# Patient Record
Sex: Female | Born: 1935
Health system: Southern US, Community
[De-identification: ages and names within clinical notes are randomized; demographics above are authoritative.]

## PROBLEM LIST (undated history)

## (undated) DIAGNOSIS — G709 Myoneural disorder, unspecified: Secondary | ICD-10-CM

## (undated) DIAGNOSIS — E039 Hypothyroidism, unspecified: Secondary | ICD-10-CM

## (undated) DIAGNOSIS — I509 Heart failure, unspecified: Secondary | ICD-10-CM

## (undated) DIAGNOSIS — R0602 Shortness of breath: Secondary | ICD-10-CM

## (undated) DIAGNOSIS — J449 Chronic obstructive pulmonary disease, unspecified: Secondary | ICD-10-CM

## (undated) DIAGNOSIS — I429 Cardiomyopathy, unspecified: Secondary | ICD-10-CM

## (undated) DIAGNOSIS — I471 Supraventricular tachycardia, unspecified: Secondary | ICD-10-CM

## (undated) DIAGNOSIS — C4490 Unspecified malignant neoplasm of skin, unspecified: Secondary | ICD-10-CM

## (undated) DIAGNOSIS — I447 Left bundle-branch block, unspecified: Secondary | ICD-10-CM

## (undated) DIAGNOSIS — L570 Actinic keratosis: Secondary | ICD-10-CM

## (undated) DIAGNOSIS — I2510 Atherosclerotic heart disease of native coronary artery without angina pectoris: Secondary | ICD-10-CM

## (undated) DIAGNOSIS — E119 Type 2 diabetes mellitus without complications: Secondary | ICD-10-CM

## (undated) DIAGNOSIS — I10 Essential (primary) hypertension: Secondary | ICD-10-CM

## (undated) DIAGNOSIS — M1990 Unspecified osteoarthritis, unspecified site: Secondary | ICD-10-CM

## (undated) DIAGNOSIS — I739 Peripheral vascular disease, unspecified: Secondary | ICD-10-CM

## (undated) DIAGNOSIS — J189 Pneumonia, unspecified organism: Secondary | ICD-10-CM

## (undated) DIAGNOSIS — J302 Other seasonal allergic rhinitis: Secondary | ICD-10-CM

## (undated) DIAGNOSIS — E785 Hyperlipidemia, unspecified: Secondary | ICD-10-CM

## (undated) HISTORY — PX: RIGHT OOPHORECTOMY: SHX2359

## (undated) HISTORY — PX: JOINT REPLACEMENT: SHX530

## (undated) HISTORY — DX: Heart failure, unspecified: I50.9

## (undated) HISTORY — DX: Actinic keratosis: L57.0

## (undated) HISTORY — PX: EYE SURGERY: SHX253

## (undated) HISTORY — PX: ESOPHAGOGASTRODUODENOSCOPY: SHX1529

## (undated) HISTORY — PX: BACK SURGERY: SHX140

## (undated) HISTORY — PX: COLONOSCOPY: SHX174

## (undated) HISTORY — PX: CARPAL TUNNEL RELEASE: SHX101

---

## 1960-03-26 HISTORY — PX: BREAST SURGERY: SHX581

## 1963-03-27 HISTORY — PX: BREAST CYST ASPIRATION: SHX578

## 2010-06-05 ENCOUNTER — Emergency Department: Payer: Self-pay | Admitting: Emergency Medicine

## 2010-06-09 ENCOUNTER — Inpatient Hospital Stay: Payer: Self-pay | Admitting: Internal Medicine

## 2010-06-19 ENCOUNTER — Inpatient Hospital Stay: Payer: Self-pay | Admitting: Internal Medicine

## 2010-06-26 LAB — PATHOLOGY REPORT

## 2010-07-12 ENCOUNTER — Encounter (HOSPITAL_COMMUNITY): Payer: Medicare Other

## 2010-07-12 ENCOUNTER — Other Ambulatory Visit: Payer: Self-pay | Admitting: Orthopedic Surgery

## 2010-07-12 LAB — BASIC METABOLIC PANEL
BUN: 12 mg/dL (ref 6–23)
CO2: 25 mEq/L (ref 19–32)
Calcium: 10.1 mg/dL (ref 8.4–10.5)
Chloride: 107 mEq/L (ref 96–112)
Creatinine, Ser: 0.93 mg/dL (ref 0.4–1.2)
GFR calc Af Amer: >60 mL/min (ref >60)
GFR calc non Af Amer: 59 mL/min — ABNORMAL LOW (ref >60)
Glucose, Bld: 107 mg/dL — ABNORMAL HIGH (ref 70–99)
Potassium: 4.3 mEq/L (ref 3.5–5.1)
Sodium: 141 mEq/L (ref 135–145)

## 2010-07-12 LAB — CBC
HCT: 41 % (ref 36.0–46.0)
Hemoglobin: 13.7 g/dL (ref 12.0–15.0)
MCH: 31.4 pg (ref 26.0–34.0)
MCHC: 33.4 g/dL (ref 30.0–36.0)
MCV: 93.8 fL (ref 78.0–100.0)
Platelets: 363 10*3/uL (ref 150–400)
RBC: 4.37 MIL/uL (ref 3.87–5.11)
RDW: 12.9 % (ref 11.5–15.5)
WBC: 6.2 10*3/uL (ref 4.0–10.5)

## 2010-07-12 LAB — SURGICAL PCR SCREEN
MRSA, PCR: NEGATIVE
Staphylococcus aureus: NEGATIVE

## 2010-07-14 ENCOUNTER — Inpatient Hospital Stay (HOSPITAL_COMMUNITY): Payer: Medicare Other

## 2010-07-14 ENCOUNTER — Inpatient Hospital Stay (HOSPITAL_COMMUNITY)
Admission: RE | Admit: 2010-07-14 | Discharge: 2010-07-16 | DRG: 491 | Disposition: A | Discharge status: Home / Self Care | Payer: Medicare Other | Source: Ambulatory Visit | Attending: Orthopedic Surgery | Admitting: Orthopedic Surgery

## 2010-07-14 DIAGNOSIS — M48061 Spinal stenosis, lumbar region without neurogenic claudication: Principal | ICD-10-CM | POA: Diagnosis present

## 2010-07-14 DIAGNOSIS — Z01812 Encounter for preprocedural laboratory examination: Secondary | ICD-10-CM

## 2010-07-14 DIAGNOSIS — J4489 Other specified chronic obstructive pulmonary disease: Secondary | ICD-10-CM | POA: Diagnosis present

## 2010-07-14 DIAGNOSIS — I10 Essential (primary) hypertension: Secondary | ICD-10-CM | POA: Diagnosis present

## 2010-07-14 DIAGNOSIS — E039 Hypothyroidism, unspecified: Secondary | ICD-10-CM | POA: Diagnosis present

## 2010-07-14 DIAGNOSIS — M549 Dorsalgia, unspecified: Secondary | ICD-10-CM

## 2010-07-14 DIAGNOSIS — K219 Gastro-esophageal reflux disease without esophagitis: Secondary | ICD-10-CM | POA: Diagnosis present

## 2010-07-14 DIAGNOSIS — J449 Chronic obstructive pulmonary disease, unspecified: Secondary | ICD-10-CM | POA: Diagnosis present

## 2010-07-27 NOTE — Op Note (Signed)
NAMEGIA, Kathryn [name]               ACCOUNT NO.:  [id number]  MEDICAL RECORD NO.:  [id number]           PATIENT TYPE:  I  LOCATION:  0005                         FACILITY:  [facility]  PHYSICIAN:  Marlowe Kays, M.D.  DATE OF BIRTH:  [birth year]  DATE OF PROCEDURE:  07/14/2010 DATE OF DISCHARGE:                              OPERATIVE REPORT   PREOPERATIVE DIAGNOSIS:  Spinal stenosis, L3-L4, L4-L5.  POSTOPERATIVE DIAGNOSIS:  Spinal stenosis, L3-L4, L4-L5.  OPERATION:  Central decompressive laminectomy at L3-L4, L4-L5.  SURGEON:  Marlowe Kays, M.D.  ASSISTANT:  Georges Lynch. Darrelyn Hillock, M.D.  ANESTHESIA:  General.  PLAN/JUSTIFICATION FOR PROCEDURE:  She has had several year history of progressive back and bilateral leg pain, right may be a little worse than the left.  She had a lumbar MRI performed at [facility], [place], on September 13, 2008, which showed moderately severe spinal stenosis at L4-L5 secondary to grade 1 degenerative spondylolisthesis and ligamentum flavum hypertrophy as well as some slight neural foraminal narrowing at L3-L4 secondary to the same.  On my reading, I felt there might even possibly be a small lateral disk bulge at L4-L5 on the right.  She did have a disk bulge there.  She has failed to respond to nonsurgical treatment consequently is seen today for the above- mentioned surgery.  DESCRIPTION OF PROCEDURE:  Prophylactic antibiotics, satisfactory general anesthesia, Foley catheter placed, placed in the prone position on rolls.  Back was prepped with DuraPrep, draped in sterile field. Time-out performed with 3 needles and spinal needles and lateral x-ray, I tentatively located the operative area desired.  I completely draped her back in sterile field.  Ioban employed.  Vertical midline incision went down through the subcutaneous tissue to expose the spinous processes of what I thought were L4 and L5.  Kocher clamps were placed and a second lateral x-ray  taken confirming that we indeed were at L4 and L5.  We then continued dissecting soft tissue off the neural arches at these levels working up to L3.  I placed self-retaining McCullough retractors with double-action rongeur.  I then removed spinous process and good portion of the neural arch of L4 and substantial portion of superior part of the spinous process of L5 and inferior L3.  Working first with double-action rongeurs and then Kerrison rongeur and finally bringing the microscope, we thoroughly decompressed her from L3 to L5. A confirmatory x-ray was taken.  All foramina were widely patent at the conclusion of decompression.  Her stenosis appeared to be most severe as depicted on the MRI at L4-L5.  We then looked at the L4-L5 disk on the right and it was firm with the L5 nerve root well decompressed.  The wound was irrigated with sterile saline and closure performed.  I placed Gelfoam soaked in thrombin over the dura and removed the self-retaining retractors.  There was no unusual bleeding.  I closed the wound in layers with interrupted #1 Vicryl in the paralumbar muscle and fascia and deep subcu tissue, 2-0 Vicryl superficially in subcutaneous tissue, staples in the skin.  During the closure, I left a small aperture of the  paralumbar muscle and fascia distally to allow for egress of any fluids.  She tolerated the procedure well and was taken to the recovery room in satisfactory condition with no known complications. Estimated blood loss was perhaps 150 cc.  No blood replacement.          ______________________________ Marlowe Kays, M.D.     JA/MEDQ  D:  07/14/2010  T:  07/14/2010  Job:  [id number]  Electronically Signed by Marlowe Kays M.D. on 07/27/2010 12:36:13 PM

## 2011-04-09 DIAGNOSIS — M48061 Spinal stenosis, lumbar region without neurogenic claudication: Secondary | ICD-10-CM | POA: Diagnosis not present

## 2011-04-27 ENCOUNTER — Ambulatory Visit: Payer: Self-pay | Admitting: Orthopedic Surgery

## 2011-04-27 DIAGNOSIS — M48061 Spinal stenosis, lumbar region without neurogenic claudication: Secondary | ICD-10-CM | POA: Diagnosis not present

## 2011-04-27 DIAGNOSIS — M5126 Other intervertebral disc displacement, lumbar region: Secondary | ICD-10-CM | POA: Diagnosis not present

## 2011-04-27 DIAGNOSIS — Q762 Congenital spondylolisthesis: Secondary | ICD-10-CM | POA: Diagnosis not present

## 2011-04-27 DIAGNOSIS — M545 Low back pain, unspecified: Secondary | ICD-10-CM | POA: Diagnosis not present

## 2011-04-27 DIAGNOSIS — M47817 Spondylosis without myelopathy or radiculopathy, lumbosacral region: Secondary | ICD-10-CM | POA: Diagnosis not present

## 2011-04-27 DIAGNOSIS — M549 Dorsalgia, unspecified: Secondary | ICD-10-CM | POA: Diagnosis not present

## 2011-04-27 DIAGNOSIS — M4310 Spondylolisthesis, site unspecified: Secondary | ICD-10-CM | POA: Diagnosis not present

## 2011-04-27 DIAGNOSIS — M5137 Other intervertebral disc degeneration, lumbosacral region: Secondary | ICD-10-CM | POA: Diagnosis not present

## 2011-05-07 DIAGNOSIS — M48061 Spinal stenosis, lumbar region without neurogenic claudication: Secondary | ICD-10-CM | POA: Diagnosis not present

## 2011-05-09 ENCOUNTER — Other Ambulatory Visit: Payer: Self-pay | Admitting: Orthopedic Surgery

## 2011-05-15 DIAGNOSIS — M48061 Spinal stenosis, lumbar region without neurogenic claudication: Secondary | ICD-10-CM | POA: Diagnosis not present

## 2011-05-16 ENCOUNTER — Encounter (HOSPITAL_COMMUNITY): Payer: Self-pay | Admitting: Pharmacy Technician

## 2011-05-17 ENCOUNTER — Encounter (HOSPITAL_COMMUNITY)
Admission: RE | Admit: 2011-05-17 | Discharge: 2011-05-17 | Disposition: A | Discharge status: Home / Self Care | Payer: Medicare Other | Source: Ambulatory Visit | Attending: Orthopedic Surgery | Admitting: Orthopedic Surgery

## 2011-05-17 ENCOUNTER — Encounter (HOSPITAL_COMMUNITY): Payer: Self-pay

## 2011-05-17 HISTORY — DX: Hypothyroidism, unspecified: E03.9

## 2011-05-17 HISTORY — DX: Essential (primary) hypertension: I10

## 2011-05-17 HISTORY — DX: Shortness of breath: R06.02

## 2011-05-17 HISTORY — DX: Other seasonal allergic rhinitis: J30.2

## 2011-05-17 HISTORY — DX: Peripheral vascular disease, unspecified: I73.9

## 2011-05-17 HISTORY — DX: Myoneural disorder, unspecified: G70.9

## 2011-05-17 HISTORY — DX: Unspecified osteoarthritis, unspecified site: M19.90

## 2011-05-17 HISTORY — DX: Pneumonia, unspecified organism: J18.9

## 2011-05-17 HISTORY — DX: Hyperlipidemia, unspecified: E78.5

## 2011-05-17 HISTORY — DX: Unspecified malignant neoplasm of skin, unspecified: C44.90

## 2011-05-17 LAB — CBC
HCT: 39 % (ref 36.0–46.0)
Hemoglobin: 12.8 g/dL (ref 12.0–15.0)
MCH: 30.2 pg (ref 26.0–34.0)
MCHC: 32.8 g/dL (ref 30.0–36.0)
MCV: 92 fL (ref 78.0–100.0)
Platelets: 330 10*3/uL (ref 150–400)
RBC: 4.24 MIL/uL (ref 3.87–5.11)
RDW: 12.7 % (ref 11.5–15.5)
WBC: 6.6 10*3/uL (ref 4.0–10.5)

## 2011-05-17 LAB — BASIC METABOLIC PANEL
BUN: 19 mg/dL (ref 6–23)
CO2: 25 mEq/L (ref 19–32)
Calcium: 10.1 mg/dL (ref 8.4–10.5)
Chloride: 100 mEq/L (ref 96–112)
Creatinine, Ser: 0.74 mg/dL (ref 0.50–1.10)
GFR calc Af Amer: >90 mL/min (ref >90)
GFR calc non Af Amer: 81 mL/min — ABNORMAL LOW (ref >90)
Glucose, Bld: 109 mg/dL — ABNORMAL HIGH (ref 70–99)
Potassium: 4.3 mEq/L (ref 3.5–5.1)
Sodium: 136 mEq/L (ref 135–145)

## 2011-05-17 LAB — SURGICAL PCR SCREEN
MRSA, PCR: NEGATIVE
Staphylococcus aureus: NEGATIVE

## 2011-05-17 NOTE — Pre-Procedure Instructions (Signed)
EKG,ECCHO,CAROTID DOPPLAR,CHEST X RAY and abd [place] with head CT 3/12 on chart

## 2011-05-17 NOTE — H&P (Signed)
NAMESALEEMAH, Kathryn [name]               ACCOUNT NO.:  [id number]  MEDICAL RECORD NO.:  [id number]  LOCATION:                               FACILITY:  [facility]  PHYSICIAN:  Marlowe Kays, M.D.  DATE OF BIRTH:  1933/06/02  DATE OF ADMISSION: DATE OF DISCHARGE:                             HISTORY & PHYSICAL   ANTICIPATORY DATE OF ADMISSION:  May 16, 2011  CHIEF COMPLAINT:  Pain in my back and legs.  HISTORY OF PRESENT ILLNESS:  This 78 year old white female seen by [place] for progressive problems concerning her pain in her low back with radiation into the buttocks.  She has had 2 previous decompressive lumbar laminectomies at lower lumbar levels and was doing very well after those until fairly recently when she was worked up at [facility] and myelogram/CT scan was done there.  Additional spinal stenosis at L3-4 with migration upwards to L2-L3 was noted.  Also was seen in a small paracentral disk protrusion at L5-S1.  Since she had bilateral symptomatology, was felt that the L2-L3 and L3-L4, were the primary areas needing a decompression.  After much discussion, including the risks and the benefits of surgery, we decided to go ahead with decompressive lumbar laminectomy at L2-L3, L3-L4.  ALLERGIES:  She has no known drug allergies.  CURRENT MEDICATIONS: 1. Synthroid 100 mcg tablet daily. 2. Amlodipine besylate 5 mg tablet daily. 3. Atorvastatin calcium 40 mg tablet daily. 4. Omeprazole magnesium 20.6 mg tablet daily.  PAST SURGICAL HISTORY:  Appendectomy [id number]; benign tumor, left breast in 1965; oophorectomy on the right, which had a subsequent staph infection in 2006; carpal tunnel of the right wrist released in 2012; decompressive lumbar laminectomy.  She has had hypothyroidism, COPD/emphysema, hypertension, hypercholesterolemia.  FAMILY HISTORY:  Positive for father dying at age 80 of lung cancer and heart problems.  Mother dying at 46 "old age."   Dementia.  SOCIAL HISTORY:  The patient is widowed, has no recent intake of tobacco products and no EtOH.  She lives alone with supportive sisters.  REVIEW OF SYSTEMS:  CENTRAL NERVOUS SYSTEM:  No seizure disorder, paralysis, numbness, or double vision.  She does have tinnitus.  Also, insomnia as noted.  RESPIRATORY:  Shortness of breath on exertion and some wheezing.  CARDIOVASCULAR:  No chest pain.  No angina.  No orthopnea.  GASTROINTESTINAL:  No nausea, vomiting, melena, bloody stool, which she has had "loss of appetite."  GENITOURINARY:  No discharge, dysuria, or hematuria.  MUSCULOSKELETAL:  Primarily in present illness with back pain and muscular pain.  PHYSICAL EXAMINATION:  GENERAL:  Alert and cooperative, friendly, somewhat in distress, 79 year old, white female, who is fully alert and oriented x3. VITAL SIGNS:  Blood pressure is 125/66, seated. HEENT:  Normocephalic.  PERRLA.  Oropharynx is clear. CHEST:  Clear to auscultation.  No rhonchi.  No rales. HEART:  Regular rate and rhythm.  No murmurs are heard. ABDOMEN:  Soft, nontender.  Spleen not felt. GENITALIA/RECTAL/PELVIC/BREASTS:  Not done, not pertinent present illness. EXTREMITIES:  Negative straight leg raising bilaterally.  Sensory is grossly intact.  ADMITTING DIAGNOSES: 1. Spinal stenosis L2-L3, L3-L4. 2. Chronic obstructive pulmonary disease. 3. Hypertension. 4. Hypothyroidism. 5. Hypercholesterolemia.  PLAN:  The patient will undergo decompressive lumbar laminectomy.  She is really looking forward to this surgery.  She has had successful results from her past surgeries.     [name] June.   ______________________________ Marlowe Kays, M.D.    DLU/MEDQ  D:  05/15/2011  T:  05/16/2011  Job:  [id number]  cc:   Julieanne Manson, MD Fax: [phone]  Marlowe Kays, M.D. Fax: [phone]

## 2011-05-17 NOTE — Patient Instructions (Signed)
20 Pollyann L Trang  05/17/2011   Your procedure is scheduled on:05/24/11   Surgery [id number]  Thursday    Report to [facility] at   1000    AM.  Call this number if you have problems the morning of surgery: [phone]     Or PST   [id number]  [facility]   Remember:   Do not eat food:After Midnight.  Wednesday NIGHT  May have clear liquids: none after MIDNIGHT Wednesday NIGHT  Clear liquids include soda, tea, black coffee, apple or grape juice, broth.  Take these medicines the morning of surgery with A SIP OF WATER:NORVASC<SYNTHROID<PROLISEC WITH SIP WATER   Do not wear jewelry, make-up or nail polish.  Do not wear lotions, powders, or perfumes. You may wear deodorant.  Do not shave 48 hours prior to surgery.  Do not bring valuables to the hospital.  Contacts, dentures or bridgework may not be worn into surgery.  Leave suitcase in the car. After surgery it may be brought to your room.  For patients admitted to the hospital, checkout time is 11:00 AM the day of discharge.   Patients discharged the day of surgery will not be allowed to drive home.  Name and phone number of your driver: son                                                                     Special Instructions: CHG Shower Use Special Wash: 1/2 bottle night before surgery and 1/2 bottle morning of surgery. REGULAR SOAP FACE AND PRIVATES              LADIES- NO SHAVING 48 HOURS BEFORE USING BETASEPT SOAP.                 Please read over the following fact sheets that you were given: MRSA Information

## 2011-05-24 ENCOUNTER — Encounter (HOSPITAL_COMMUNITY): Payer: Self-pay | Admitting: Anesthesiology

## 2011-05-24 ENCOUNTER — Ambulatory Visit (HOSPITAL_COMMUNITY): Payer: Medicare Other | Admitting: Anesthesiology

## 2011-05-24 ENCOUNTER — Encounter (HOSPITAL_COMMUNITY)
Admission: RE | Disposition: A | Discharge status: Home / Self Care | Payer: Self-pay | Source: Ambulatory Visit | Attending: Orthopedic Surgery

## 2011-05-24 ENCOUNTER — Inpatient Hospital Stay (HOSPITAL_COMMUNITY)
Admission: RE | Admit: 2011-05-24 | Discharge: 2011-05-26 | DRG: 491 | Disposition: A | Discharge status: Home / Self Care | Payer: Medicare Other | Source: Ambulatory Visit | Attending: Orthopedic Surgery | Admitting: Orthopedic Surgery

## 2011-05-24 ENCOUNTER — Ambulatory Visit (HOSPITAL_COMMUNITY): Payer: Medicare Other

## 2011-05-24 ENCOUNTER — Encounter (HOSPITAL_COMMUNITY): Payer: Self-pay | Admitting: *Deleted

## 2011-05-24 DIAGNOSIS — E7800 Pure hypercholesterolemia, unspecified: Secondary | ICD-10-CM | POA: Diagnosis present

## 2011-05-24 DIAGNOSIS — Z9889 Other specified postprocedural states: Secondary | ICD-10-CM | POA: Diagnosis not present

## 2011-05-24 DIAGNOSIS — M48061 Spinal stenosis, lumbar region without neurogenic claudication: Principal | ICD-10-CM | POA: Diagnosis present

## 2011-05-24 DIAGNOSIS — J4489 Other specified chronic obstructive pulmonary disease: Secondary | ICD-10-CM | POA: Diagnosis not present

## 2011-05-24 DIAGNOSIS — E039 Hypothyroidism, unspecified: Secondary | ICD-10-CM | POA: Diagnosis present

## 2011-05-24 DIAGNOSIS — M519 Unspecified thoracic, thoracolumbar and lumbosacral intervertebral disc disorder: Secondary | ICD-10-CM | POA: Diagnosis not present

## 2011-05-24 DIAGNOSIS — I10 Essential (primary) hypertension: Secondary | ICD-10-CM | POA: Diagnosis present

## 2011-05-24 DIAGNOSIS — M47817 Spondylosis without myelopathy or radiculopathy, lumbosacral region: Secondary | ICD-10-CM

## 2011-05-24 DIAGNOSIS — M549 Dorsalgia, unspecified: Secondary | ICD-10-CM | POA: Diagnosis not present

## 2011-05-24 DIAGNOSIS — J449 Chronic obstructive pulmonary disease, unspecified: Secondary | ICD-10-CM | POA: Diagnosis not present

## 2011-05-24 DIAGNOSIS — Z981 Arthrodesis status: Secondary | ICD-10-CM | POA: Diagnosis not present

## 2011-05-24 HISTORY — PX: LUMBAR LAMINECTOMY/DECOMPRESSION MICRODISCECTOMY: SHX5026

## 2011-05-24 LAB — TYPE AND SCREEN
ABO/RH(D): O POS
Antibody Screen: NEGATIVE

## 2011-05-24 LAB — ABO/RH: ABO/RH(D): O POS

## 2011-05-24 SURGERY — LUMBAR LAMINECTOMY/DECOMPRESSION MICRODISCECTOMY 2 LEVELS
Anesthesia: General | Site: Back | Wound class: Clean

## 2011-05-24 MED ORDER — ACETAMINOPHEN 10 MG/ML IV SOLN
INTRAVENOUS | Status: DC | PRN
  Administered 2011-05-24: 1000 mg via INTRAVENOUS

## 2011-05-24 MED ORDER — AMLODIPINE BESYLATE 5 MG PO TABS
5.0000 mg | ORAL_TABLET | Freq: Every day | ORAL | Status: DC
  Administered 2011-05-25 – 2011-05-26 (×2): 5 mg via ORAL
  Filled 2011-05-24 (×2): qty 1

## 2011-05-24 MED ORDER — ATORVASTATIN CALCIUM 40 MG PO TABS
40.0000 mg | ORAL_TABLET | Freq: Every day | ORAL | Status: DC
  Administered 2011-05-25 – 2011-05-26 (×2): 40 mg via ORAL
  Filled 2011-05-24 (×2): qty 1

## 2011-05-24 MED ORDER — LACTATED RINGERS IV SOLN
INTRAVENOUS | Status: DC | PRN
  Administered 2011-05-24 (×3): via INTRAVENOUS

## 2011-05-24 MED ORDER — GLYCOPYRROLATE 0.2 MG/ML IJ SOLN
INTRAMUSCULAR | Status: DC | PRN
  Administered 2011-05-24: .2 mg via INTRAVENOUS
  Administered 2011-05-24: .4 mg via INTRAVENOUS

## 2011-05-24 MED ORDER — ACETAMINOPHEN 500 MG PO TABS: 500.0000 mg | ORAL_TABLET | Freq: Four times a day (QID) | ORAL | Status: DC | PRN

## 2011-05-24 MED ORDER — DEXAMETHASONE SODIUM PHOSPHATE 10 MG/ML IJ SOLN
INTRAMUSCULAR | Status: DC | PRN
  Administered 2011-05-24: 10 mg via INTRAVENOUS

## 2011-05-24 MED ORDER — ONDANSETRON HCL 4 MG/2ML IJ SOLN
4.0000 mg | INTRAMUSCULAR | Status: DC | PRN
  Administered 2011-05-24: 4 mg via INTRAVENOUS
  Filled 2011-05-24: qty 2

## 2011-05-24 MED ORDER — DEXTROSE IN LACTATED RINGERS 5 % IV SOLN
INTRAVENOUS | Status: DC
  Administered 2011-05-24 – 2011-05-25 (×2): via INTRAVENOUS

## 2011-05-24 MED ORDER — CEFAZOLIN SODIUM-DEXTROSE 2-3 GM-% IV SOLR
2.0000 g | Freq: Three times a day (TID) | INTRAVENOUS | Status: AC
  Administered 2011-05-24 – 2011-05-25 (×2): 2 g via INTRAVENOUS
  Filled 2011-05-24 (×2): qty 50

## 2011-05-24 MED ORDER — ACETAMINOPHEN 10 MG/ML IV SOLN
INTRAVENOUS | Status: AC
  Filled 2011-05-24: qty 100

## 2011-05-24 MED ORDER — FENTANYL CITRATE 0.05 MG/ML IJ SOLN
INTRAMUSCULAR | Status: DC | PRN
  Administered 2011-05-24: 25 ug via INTRAVENOUS
  Administered 2011-05-24: 50 ug via INTRAVENOUS
  Administered 2011-05-24: 100 ug via INTRAVENOUS
  Administered 2011-05-24 (×2): 50 ug via INTRAVENOUS
  Administered 2011-05-24: 25 ug via INTRAVENOUS
  Administered 2011-05-24 (×4): 50 ug via INTRAVENOUS

## 2011-05-24 MED ORDER — POVIDONE-IODINE 7.5 % EX SOLN: Freq: Once | CUTANEOUS | Status: DC

## 2011-05-24 MED ORDER — BISACODYL 10 MG RE SUPP: 10.0000 mg | Freq: Every day | RECTAL | Status: DC | PRN

## 2011-05-24 MED ORDER — ZOLPIDEM TARTRATE 5 MG PO TABS: 5.0000 mg | ORAL_TABLET | Freq: Every evening | ORAL | Status: DC | PRN

## 2011-05-24 MED ORDER — LISINOPRIL 10 MG PO TABS
10.0000 mg | ORAL_TABLET | Freq: Every day | ORAL | Status: DC
  Administered 2011-05-24 – 2011-05-25 (×2): 10 mg via ORAL
  Filled 2011-05-24 (×3): qty 1

## 2011-05-24 MED ORDER — METHOCARBAMOL 500 MG PO TABS
500.0000 mg | ORAL_TABLET | Freq: Four times a day (QID) | ORAL | Status: DC | PRN
  Administered 2011-05-24 – 2011-05-26 (×4): 500 mg via ORAL
  Filled 2011-05-24 (×4): qty 1

## 2011-05-24 MED ORDER — CEFAZOLIN SODIUM-DEXTROSE 2-3 GM-% IV SOLR
INTRAVENOUS | Status: AC
  Filled 2011-05-24: qty 50

## 2011-05-24 MED ORDER — PHENOL 1.4 % MT LIQD: 1.0000 | OROMUCOSAL | Status: DC | PRN

## 2011-05-24 MED ORDER — EPHEDRINE SULFATE 50 MG/ML IJ SOLN
INTRAMUSCULAR | Status: DC | PRN
  Administered 2011-05-24: 10 mg via INTRAVENOUS

## 2011-05-24 MED ORDER — ROCURONIUM BROMIDE 100 MG/10ML IV SOLN
INTRAVENOUS | Status: DC | PRN
  Administered 2011-05-24 (×3): 10 mg via INTRAVENOUS
  Administered 2011-05-24: 30 mg via INTRAVENOUS
  Administered 2011-05-24: 10 mg via INTRAVENOUS

## 2011-05-24 MED ORDER — LEVOTHYROXINE SODIUM 100 MCG PO TABS
100.0000 ug | ORAL_TABLET | Freq: Every day | ORAL | Status: DC
  Administered 2011-05-25 – 2011-05-26 (×2): 100 ug via ORAL
  Filled 2011-05-24 (×2): qty 1

## 2011-05-24 MED ORDER — LIDOCAINE HCL (CARDIAC) 20 MG/ML IV SOLN
INTRAVENOUS | Status: DC | PRN
  Administered 2011-05-24: 50 mg via INTRAVENOUS

## 2011-05-24 MED ORDER — PROMETHAZINE HCL 25 MG/ML IJ SOLN: 6.2500 mg | INTRAMUSCULAR | Status: DC | PRN

## 2011-05-24 MED ORDER — NEOSTIGMINE METHYLSULFATE 1 MG/ML IJ SOLN
INTRAMUSCULAR | Status: DC | PRN
  Administered 2011-05-24: 2 mg via INTRAVENOUS

## 2011-05-24 MED ORDER — THROMBIN 5000 UNITS EX SOLR
CUTANEOUS | Status: AC
  Filled 2011-05-24: qty 10000

## 2011-05-24 MED ORDER — THROMBIN 5000 UNITS EX SOLR
CUTANEOUS | Status: DC | PRN
  Administered 2011-05-24: 10000 [IU] via TOPICAL

## 2011-05-24 MED ORDER — PANTOPRAZOLE SODIUM 40 MG PO TBEC
40.0000 mg | DELAYED_RELEASE_TABLET | Freq: Every day | ORAL | Status: DC
  Administered 2011-05-24 – 2011-05-25 (×2): 40 mg via ORAL
  Filled 2011-05-24 (×3): qty 1

## 2011-05-24 MED ORDER — OXYMETAZOLINE HCL 0.05 % NA SOLN
1.0000 | Freq: Every day | NASAL | Status: DC
  Filled 2011-05-24: qty 15

## 2011-05-24 MED ORDER — SODIUM CHLORIDE 0.9 % IJ SOLN: 3.0000 mL | INTRAMUSCULAR | Status: DC | PRN

## 2011-05-24 MED ORDER — SODIUM CHLORIDE 0.9 % IJ SOLN
3.0000 mL | Freq: Two times a day (BID) | INTRAMUSCULAR | Status: DC
  Administered 2011-05-24: 3 mL via INTRAVENOUS

## 2011-05-24 MED ORDER — HYDROMORPHONE HCL PF 1 MG/ML IJ SOLN
INTRAMUSCULAR | Status: AC
  Filled 2011-05-24: qty 1

## 2011-05-24 MED ORDER — CEFAZOLIN SODIUM-DEXTROSE 2-3 GM-% IV SOLR
2.0000 g | Freq: Once | INTRAVENOUS | Status: AC
  Administered 2011-05-24: 2 g via INTRAVENOUS

## 2011-05-24 MED ORDER — PROPOFOL 10 MG/ML IV BOLUS
INTRAVENOUS | Status: DC | PRN
  Administered 2011-05-24: 150 mg via INTRAVENOUS

## 2011-05-24 MED ORDER — SODIUM CHLORIDE 0.9 % IV SOLN: 250.0000 mL | INTRAVENOUS | Status: DC

## 2011-05-24 MED ORDER — HYDROMORPHONE HCL PF 1 MG/ML IJ SOLN
0.5000 mg | INTRAMUSCULAR | Status: DC | PRN
  Administered 2011-05-24: 0.5 mg via INTRAVENOUS

## 2011-05-24 MED ORDER — DEXTROSE-NACL 5-0.45 % IV SOLN: INTRAVENOUS | Status: DC

## 2011-05-24 MED ORDER — HYDROMORPHONE HCL PF 1 MG/ML IJ SOLN
0.2500 mg | INTRAMUSCULAR | Status: DC | PRN
  Administered 2011-05-24 (×4): 0.25 mg via INTRAVENOUS

## 2011-05-24 MED ORDER — MENTHOL 3 MG MT LOZG: 1.0000 | LOZENGE | OROMUCOSAL | Status: DC | PRN

## 2011-05-24 MED ORDER — ONDANSETRON HCL 4 MG/2ML IJ SOLN
INTRAMUSCULAR | Status: DC | PRN
  Administered 2011-05-24 (×2): 2 mg via INTRAVENOUS

## 2011-05-24 MED ORDER — HYDROCODONE-ACETAMINOPHEN 5-325 MG PO TABS
1.0000 | ORAL_TABLET | ORAL | Status: DC | PRN
  Administered 2011-05-24 (×2): 1 via ORAL
  Administered 2011-05-25 – 2011-05-26 (×5): 2 via ORAL
  Filled 2011-05-24 (×2): qty 2
  Filled 2011-05-24: qty 1
  Filled 2011-05-24 (×3): qty 2
  Filled 2011-05-24: qty 1

## 2011-05-24 MED ORDER — METHOCARBAMOL 100 MG/ML IJ SOLN
500.0000 mg | Freq: Four times a day (QID) | INTRAVENOUS | Status: DC | PRN
  Administered 2011-05-24: 500 mg via INTRAVENOUS
  Filled 2011-05-24: qty 5

## 2011-05-24 SURGICAL SUPPLY — 39 items
BAG ZIPLOCK 12X15 (MISCELLANEOUS) ×2 IMPLANT
BENZOIN TINCTURE PRP APPL 2/3 (GAUZE/BANDAGES/DRESSINGS) ×2 IMPLANT
BUR EGG ELITE 4.0 (BURR) IMPLANT
CLEANER TIP ELECTROSURG 2X2 (MISCELLANEOUS) ×2 IMPLANT
CLOTH BEACON ORANGE TIMEOUT ST (SAFETY) ×2 IMPLANT
CONT SPEC 4OZ CLIKSEAL STRL BL (MISCELLANEOUS) IMPLANT
DRAIN PENROSE 18X1/4 LTX STRL (WOUND CARE) IMPLANT
DRAPE MICROSCOPE LEICA (MISCELLANEOUS) ×2 IMPLANT
DRAPE POUCH INSTRU U-SHP 10X18 (DRAPES) ×2 IMPLANT
DRAPE SURG 17X11 SM STRL (DRAPES) ×2 IMPLANT
DRSG ADAPTIC 3X8 NADH LF (GAUZE/BANDAGES/DRESSINGS) ×2 IMPLANT
DRSG PAD ABDOMINAL 8X10 ST (GAUZE/BANDAGES/DRESSINGS) ×2 IMPLANT
DURAPREP 26ML APPLICATOR (WOUND CARE) ×2 IMPLANT
ELECT BLADE TIP CTD 4 INCH (ELECTRODE) ×2 IMPLANT
ELECT REM PT RETURN 9FT ADLT (ELECTROSURGICAL) ×2
ELECTRODE REM PT RTRN 9FT ADLT (ELECTROSURGICAL) ×1 IMPLANT
GLOVE BIO SURGEON STRL SZ8 (GLOVE) IMPLANT
GLOVE ECLIPSE 8.0 STRL XLNG CF (GLOVE) ×4 IMPLANT
GLOVE INDICATOR 8.0 STRL GRN (GLOVE) ×4 IMPLANT
GOWN STRL REIN XL XLG (GOWN DISPOSABLE) ×4 IMPLANT
KIT BASIN OR (CUSTOM PROCEDURE TRAY) ×2 IMPLANT
MANIFOLD NEPTUNE II (INSTRUMENTS) ×2 IMPLANT
NEEDLE SPNL 18GX3.5 QUINCKE PK (NEEDLE) ×6 IMPLANT
NS IRRIG 1000ML POUR BTL (IV SOLUTION) ×2 IMPLANT
PATTIES SURGICAL .5 X.5 (GAUZE/BANDAGES/DRESSINGS) ×2 IMPLANT
PATTIES SURGICAL .75X.75 (GAUZE/BANDAGES/DRESSINGS) ×2 IMPLANT
PATTIES SURGICAL 1X1 (DISPOSABLE) IMPLANT
POSITIONER SURGICAL ARM (MISCELLANEOUS) ×2 IMPLANT
SPONGE GAUZE 4X4 12PLY (GAUZE/BANDAGES/DRESSINGS) ×2 IMPLANT
SPONGE LAP 4X18 X RAY DECT (DISPOSABLE) ×4 IMPLANT
SPONGE SURGIFOAM ABS GEL 100 (HEMOSTASIS) ×2 IMPLANT
STAPLER VISISTAT 35W (STAPLE) ×2 IMPLANT
SUT VIC AB 1 CT1 27 (SUTURE) ×2
SUT VIC AB 1 CT1 27XBRD ANTBC (SUTURE) ×2 IMPLANT
SUT VIC AB 2-0 CT1 27 (SUTURE) ×2
SUT VIC AB 2-0 CT1 27XBRD (SUTURE) ×2 IMPLANT
TAPE CLOTH SURG 4X10 WHT LF (GAUZE/BANDAGES/DRESSINGS) ×2 IMPLANT
TOWEL OR 17X26 10 PK STRL BLUE (TOWEL DISPOSABLE) ×4 IMPLANT
TRAY LAMINECTOMY (CUSTOM PROCEDURE TRAY) ×2 IMPLANT

## 2011-05-24 NOTE — Anesthesia Preprocedure Evaluation (Signed)
Anesthesia Evaluation  Patient identified by MRN, date of birth, ID band Patient awake    Reviewed: Allergy & Precautions, H&P , NPO status , Patient's Chart, lab work & pertinent test results, reviewed documented beta blocker date and time   Airway Mallampati: II TM Distance: >3 FB Neck ROM: Full    Dental  (+) Dental Advisory Given   Pulmonary COPD COPD inhaler, former smoker Inhaler prn clear to auscultation  + decreased breath sounds      Cardiovascular hypertension, Pt. on medications Regular Normal Denies cardiac symptoms   Neuro/Psych Negative Neurological ROS  Negative Psych ROS   GI/Hepatic negative GI ROS, Neg liver ROS,   Endo/Other  Hypothyroidism Tyroid replacement  Renal/GU negative Renal ROS  Genitourinary negative   Musculoskeletal   Abdominal   Peds negative pediatric ROS (+)  Hematology negative hematology ROS (+)   Anesthesia Other Findings Upper front caps  Reproductive/Obstetrics negative OB ROS                           Anesthesia Physical Anesthesia Plan  ASA: III  Anesthesia Plan: General   Post-op Pain Management:    Induction: Intravenous  Airway Management Planned: Oral ETT  Additional Equipment:   Intra-op Plan:   Post-operative Plan:   Informed Consent: I have reviewed the patients History and Physical, chart, labs and discussed the procedure including the risks, benefits and alternatives for the proposed anesthesia with the patient or authorized representative who has indicated his/her understanding and acceptance.   Dental advisory given  Plan Discussed with: CRNA and Surgeon  Anesthesia Plan Comments:         Anesthesia Quick Evaluation

## 2011-05-24 NOTE — Preoperative (Signed)
Beta Blockers   Reason not to administer Beta Blockers:Not Applicable 

## 2011-05-24 NOTE — Brief Op Note (Signed)
05/24/2011  3:41 PM  PATIENT:  Kathryn [name]  78 y.o. female  PRE-OPERATIVE DIAGNOSIS:  spinal stenosis  L2-3 and L3-4  POST-OPERATIVE DIAGNOSIS:  Spinal Stenosis Lumbar two lumbar three Lumbar three lumbar four  PROCEDURE:  Procedure(s) (LRB): LUMBAR LAMINECTOMY/DECOMPRESSION MICRODISCECTOMY 2 LEVELS (N/A)L2-3 and L3-4  SURGEON:  Surgeon(s) and Role:    * Drucilla Schmidt, MD - Primary    * Jacki Cones, MD - Assisting  PHYSICIAN ASSISTANT:   ASSISTANTS:nurse  ANESTHESIA:   general  EBL:  Total I/O In: 1900 [I.V.:1900] Out: 375 [Urine:200; Blood:175]  BLOOD ADMINISTERED:none  DRAINS: none   LOCAL MEDICATIONS USED:  NONE  SPECIMEN:  No Specimen  DISPOSITION OF SPECIMEN:  N/A  COUNTS:  YES  TOURNIQUET:  * No tourniquets in log *  DICTATION: .Other Dictation: Dictation Number [id number]  PLAN OF CARE: Admit to inpatient   PATIENT DISPOSITION:  PACU - hemodynamically stable.   Delay start of Pharmacological VTE agent (>24hrs) due to surgical blood loss or risk of bleeding: yes

## 2011-05-24 NOTE — Transfer of Care (Signed)
Immediate Anesthesia Transfer of Care Note  Patient: Kathryn [name]  Procedure(s) Performed: Procedure(s) (LRB): LUMBAR LAMINECTOMY/DECOMPRESSION MICRODISCECTOMY 2 LEVELS (N/A)  Patient Location: PACU  Anesthesia Type: General  Level of Consciousness: awake, alert , oriented and patient cooperative  Airway & Oxygen Therapy: Patient Spontanous Breathing and Patient connected to face mask oxygen  Post-op Assessment: Report given to PACU RN, Post -op Vital signs reviewed and stable and Patient moving all extremities  Post vital signs: Reviewed and stable  Complications: No apparent anesthesia complications

## 2011-05-24 NOTE — H&P (Signed)
I have personally re-examined her today.  There are no changes from the attached H and O.

## 2011-05-24 NOTE — Anesthesia Postprocedure Evaluation (Signed)
  Anesthesia Post-op Note  Patient: Kathryn [name]  Procedure(s) Performed: Procedure(s) (LRB): LUMBAR LAMINECTOMY/DECOMPRESSION MICRODISCECTOMY 2 LEVELS (N/A)  Patient Location: PACU  Anesthesia Type: General  Level of Consciousness: oriented and sedated  Airway and Oxygen Therapy: Patient Spontanous Breathing and Patient connected to nasal cannula oxygen  Post-op Pain: mild  Post-op Assessment: Post-op Vital signs reviewed, Patient's Cardiovascular Status Stable, Respiratory Function Stable and Patent Airway  Post-op Vital Signs: stable  Complications: No apparent anesthesia complications

## 2011-05-25 ENCOUNTER — Encounter (HOSPITAL_COMMUNITY): Payer: Self-pay | Admitting: Orthopedic Surgery

## 2011-05-25 MED ORDER — HYDROCODONE-ACETAMINOPHEN 5-325 MG PO TABS: 1.0000 | ORAL_TABLET | ORAL | Status: AC | PRN

## 2011-05-25 MED ORDER — METHOCARBAMOL 500 MG PO TABS: 500.0000 mg | ORAL_TABLET | Freq: Four times a day (QID) | ORAL | Status: AC | PRN

## 2011-05-25 NOTE — Progress Notes (Signed)
CARE MANAGEMENT NOTE 05/25/2011  Patient:  AIREN, DALES   Account Number:  [id number]  Date Initiated:  05/25/2011  Documentation initiated by:  Colleen Can  Subjective/Objective Assessment:   dx spinal stenosis; decompressive laminectomy     Action/Plan:   Cm spoke with patient. Patient plans to go back home where family will be caregivers. No dme needs   Anticipated DC Date:  05/26/2011   Anticipated DC Plan:  HOME/SELF CARE  In-house referral  Clinical Social Worker      DC Planning Services  CM consult      [facility] Choice  NA   Choice offered to / List presented to:  NA   DME arranged  NA      DME agency  NA     HH arranged  NA      HH agency  NA   Status of service:  Completed, signed off Medicare Important Message given?  NA - LOS <3 / Initial given by admissions (If response is "NO", the following Medicare IM given date fields will be blank) Date Medicare IM given:   Date Additional Medicare IM given:    Comments:  05/25/2011 There are no hh or dme needs assessed at this time. No recommendations from PT or orders from MD for hh services. anticipate d/c 05/26/2011

## 2011-05-25 NOTE — Discharge Summary (Signed)
NAMEALLIANNA, Kathryn [name]               ACCOUNT NO.:  [id number]  MEDICAL RECORD NO.:  [id number]  LOCATION:  1617                         FACILITY:  [facility]  PHYSICIAN:  Marlowe Kays, M.D.  DATE OF BIRTH:  Aug 07, 1933  DATE OF ADMISSION:  05/24/2011 DATE OF DISCHARGE:                              DISCHARGE SUMMARY   DATE OF ANTICIPATED DISCHARGE:  May 26, 2011.  ADMITTING DIAGNOSES: 1. Spinal stenosis L2-L3, L3-L4. 2. Chronic obstructive pulmonary disease. 3. Hypertension. 4. Hypothyroidism. 5. Hypercholesterolemia.  DISCHARGE DIAGNOSES: 1. Spinal stenosis L2-L3, L3-L4. 2. Chronic obstructive pulmonary disease. 3. Hypertension. 4. Hypothyroidism. 5. Hypercholesterolemia.  OPERATION:  On May 24, 2011, the patient underwent lumbar laminectomy with decompression and microdiskectomy at 2 levels at L2-L3, L3-L4.  Dr. [name] assisted.  BRIEF HISTORY:  This 78 year old lady has had continuing back pain with radiation to both lower extremities and buttocks.  She has had 2 previous lower lumbar laminectomies in the past, but her current symptoms were consistent with a higher stenotic symptomatology in the lumbar spine.  Myelogram and CT scan was done showing spinal stenosis at L3-L4, with migrations over to L2-L3.  After much discussion, including risks and benefits of surgery and the fact that her overall lifestyle has been compromised.  It was decided go ahead with the above procedure.  COURSE IN THE HOSPITAL:  The patient tolerated surgical procedure quite well.  The first postoperative day, she was awake and alert, and had essentially no leg pain whatsoever.  Wound was clean and dry.  She was anxious to go home, so we made arrangements for discharge, to work with physical therapy to get up and about.  LABORATORY VALUES:  Essentially normal.  Vital signs are normal as well.  She was discharged home with mild analgesics Vicodin and Robaxin.  She is to return to  the office 2 weeks after date of surgery.  There is dry dressing to the back as she desires.     Veto Macqueen L. Cherlynn June.   ______________________________ Marlowe Kays, M.D.    DLU/MEDQ  D:  05/25/2011  T:  05/25/2011  Job:  [id number]  cc:   Dr. Julieanne Manson Fax# [phone]  Marlowe Kays, M.D. Fax: [phone]

## 2011-05-25 NOTE — Discharge Instructions (Signed)
Call for appt. To be SEEN in 2 weeks. May change dressing in 4 days and apply dry dressing. Activity as desired.

## 2011-05-25 NOTE — Evaluation (Signed)
Occupational Therapy Evaluation Patient Details Name: Kathryn [name] MRN: [id number] DOB: 06-Feb-1934 Today's Date: 05/25/2011  Problem List: There is no problem list on file for this patient.   Past Medical History:  Past Medical History  Diagnosis Date  . Hypertension     PCP Dr Julieanne Manson   [place]  . Shortness of breath   . Pneumonia   . Hypothyroidism   . Peripheral vascular disease     varicose veins left leg  . Gastric ulcer 4/12    treated with Prolisec- states no problems now  . Neuromuscular disorder     slight numbness right toes- comes and goes  . Skin cancer     multiple from face  . Seasonal allergies   . Arthritis     spinal stenosis  . Hyperlipidemia    Past Surgical History:  Past Surgical History  Procedure Date  . Back surgery     4/12  Dr Simonne Come- for lumbar stenosis  . Breast surgery 1962    lumpectomy with biopsy   left  . Eye surgery     cataract extraction with IOL bilaterally  . Carpal tunnel release     right  . Colonoscopy   . Esophagogastroduodenoscopy   . Right oophorectomy     due to [facility] INFECTION    OT Assessment/Plan/Recommendation OT Assessment Clinical Impression Statement: Pt mobilizing very well POD#1 without any complaints of pain. All education completed. Pt will have necessary level of A from family upon d/c. OT Recommendation/Assessment: Patient does not need any further OT services OT Recommendation Follow Up Recommendations: No OT follow up Equipment Recommended: None recommended by OT OT Goals    OT Evaluation Precautions/Restrictions  Precautions Precautions: Back Restrictions Weight Bearing Restrictions: No Prior Functioning Home Living Lives With: Alone Receives Help From: Family;Other (Comment) (2 sisters will be helping) Type of Home: House Home Layout: One level Home Access: Stairs to enter Entrance Stairs-Rails: Can reach both Entrance Stairs-Number of Steps: 3 Bathroom Shower/Tub:  Engineer, manufacturing systems: Standard Home Adaptive Equipment: Grab bars in shower;Straight cane;Walker - rolling Prior Function Level of Independence: Independent with basic ADLs;Independent with transfers;Independent with homemaking with ambulation;Independent with gait Driving: Yes Vocation: Retired ADL ADL Grooming: Performed;Wash/dry hands;Supervision/safety Where Assessed - Grooming: Standing at sink Upper Body Bathing: Simulated;Set up Where Assessed - Upper Body Bathing: Sitting, bed;Unsupported Lower Body Bathing: Simulated;Set up Where Assessed - Lower Body Bathing: Sit to stand from bed Upper Body Dressing: Simulated;Set up Where Assessed - Upper Body Dressing: Sit to stand from bed Lower Body Dressing: Performed;Set up Lower Body Dressing Details (indicate cue type and reason): Pt donned socks in supine. Discussed different techniques, equip for LB ADLs Where Assessed - Lower Body Dressing: Supine, head of bed flat Toilet Transfer: Performed;Supervision/safety Toilet Transfer Method: Proofreader: Regular height toilet;Grab bars Toileting - Clothing Manipulation: Performed;Supervision/safety Where Assessed - Toileting Clothing Manipulation: Sit to stand from 3-in-1 or toilet Toileting - Hygiene: Simulated;Supervision/safety Where Assessed - Toileting Hygiene: Sit to stand from 3-in-1 or toilet Tub/Shower Transfer: Not assessed Tub/Shower Transfer Method: Not assessed Equipment Used: Rolling walker Ambulation Related to ADLs: Pt mobilizing very well without pain. ADL Comments: Pt educated in all back precautions. Provided handout. Vision/Perception    Cognition Cognition Arousal/Alertness: Awake/alert Overall Cognitive Status: Appears within functional limits for tasks assessed Orientation Level: Oriented X4 Sensation/Coordination   Extremity Assessment RUE Assessment RUE Assessment: Within Functional Limits LUE Assessment LUE  Assessment: Within Functional Limits Mobility  Bed  Mobility Bed Mobility: Yes Supine to Sit: 5: Supervision Supine to Sit Details (indicate cue type and reason): Educated in log roll with good return demo. Transfers Transfers: Yes Sit to Stand: 5: Supervision;From elevated surface;With upper extremity assist;From bed;From chair/3-in-1 Sit to Stand Details (indicate cue type and reason): cues for hand placement, technique. Stand to Sit: 5: Supervision;With upper extremity assist;With armrests;To chair/3-in-1;To toilet Stand to Sit Details: cues for hand placement, technique Exercises   End of Session OT - End of Session Activity Tolerance: Patient tolerated treatment well Patient left: in chair;with call bell in reach General Behavior During Session: [facility] for tasks performed Cognition: [facility] for tasks performed   Kloey Cazarez A OTR/L [phone] 05/25/2011, 10:17 AM

## 2011-05-25 NOTE — Progress Notes (Signed)
Plan and Rx on chart. Discharge summary dictated [id number].

## 2011-05-25 NOTE — Progress Notes (Signed)
Patient ID: Kathryn [name], female   DOB: 1933-09-07, 78 y.o.   MRN: [id number] PO day 1--pre-op leg symptoms completely relieved.  Foley out this am.  Probably home tomorrow.

## 2011-05-25 NOTE — Evaluation (Signed)
Physical Therapy Evaluation Patient Details Name: Kathryn [name] MRN: [id number] DOB: 1933/07/25 Today's Date: 05/25/2011  Problem List: There is no problem list on file for this patient.   Past Medical History:  Past Medical History  Diagnosis Date  . Hypertension     PCP Dr Julieanne Manson   [place]  . Shortness of breath   . Pneumonia   . Hypothyroidism   . Peripheral vascular disease     varicose veins left leg  . Gastric ulcer 4/12    treated with Prolisec- states no problems now  . Neuromuscular disorder     slight numbness right toes- comes and goes  . Skin cancer     multiple from face  . Seasonal allergies   . Arthritis     spinal stenosis  . Hyperlipidemia    Past Surgical History:  Past Surgical History  Procedure Date  . Back surgery     4/12  Dr Simonne Come- for lumbar stenosis  . Breast surgery 1962    lumpectomy with biopsy   left  . Eye surgery     cataract extraction with IOL bilaterally  . Carpal tunnel release     right  . Colonoscopy   . Esophagogastroduodenoscopy   . Right oophorectomy     due to [facility] INFECTION    PT Assessment/Plan/Recommendation PT Assessment Clinical Impression Statement: pt is s/p decompression . tolerated ambulation well. plans dc to home. pt pleased Leg pain is gone. pt will benefit from PT to improve in functional I . and demo back precautions. PT Recommendation/Assessment: Patient will need skilled PT in the acute care venue PT Therapy Diagnosis : Difficulty walking;Acute pain PT Plan PT Frequency: Min 5X/week PT Treatment/Interventions: Gait training;Patient/family education PT Recommendation Recommendations for Other Services: OT consult Follow Up Recommendations: No PT follow up;Supervision/Assistance - 24 hour Equipment Recommended: None recommended by PT PT Goals  Acute Rehab PT Goals PT Goal Formulation: With patient Time For Goal Achievement: 3 days Pt will Roll Supine to Right Side: Independently PT  Goal: Rolling Supine to Right Side - Progress: Goal set today Pt will go Supine/Side to Sit: Independently PT Goal: Supine/Side to Sit - Progress: Goal set today Pt will go Sit to Supine/Side: Independently PT Goal: Sit to Supine/Side - Progress: Goal set today Pt will go Sit to Stand: Independently PT Goal: Sit to Stand - Progress: Goal set today Pt will Ambulate: >150 feet;with supervision PT Goal: Ambulate - Progress: Goal set today  PT Evaluation Precautions/Restrictions  Precautions Precautions: Back Prior Functioning   I but limited by leg pain.   Cognition Cognition Arousal/Alertness: Awake/alert Overall Cognitive Status: Appears within functional limits for tasks assessed Orientation Level: Oriented X4 Sensation/Coordination   Extremity Assessment RUE Assessment RUE Assessment: Within Functional Limits LUE Assessment LUE Assessment: Within Functional Limits RLE Assessment RLE Assessment: Within Functional Limits LLE Assessment LLE Assessment: Within Functional Limits Mobility (including Balance) Bed Mobility Bed Mobility: Yes  Sit to Sidelying Right: 5: Supervision;HOB flat Sit to Sidelying Right Details (indicate cue type and reason):  back precautions. Transfers Sit to Stand: 4: Min assist (min guard from recliner and toilet) Sit to Stand Details (indicate cue type and reason): cues for hand placement, technique. Stand to Sit: 5: Supervision;With upper extremity assist;With armrests;To toilet;To bed Stand to Sit Details: cues for hand placement, technique Ambulation/Gait Ambulation/Gait: Yes Ambulation/Gait Assistance: 4: Min assist Ambulation/Gait Assistance Details (indicate cue type and reason): pt pushed IV pole min guard. Ambulation Distance (Feet): 300 Feet  Assistive device:  (iv pole) Gait Pattern: Step-to pattern    Exercise    End of Session General pt left in bed w/ call bell. Behavior During Session: [facility] for tasks performed Cognition: [facility]  for tasks performed  Rada Hay 05/25/2011, 12:52 PM

## 2011-05-25 NOTE — Op Note (Signed)
Kathryn [name], Kathryn [name]               ACCOUNT NO.:  [id number]  MEDICAL RECORD NO.:  [id number]  LOCATION:  1617                         FACILITY:  [facility]  PHYSICIAN:  Marlowe Kays, M.D.  DATE OF BIRTH:  05-10-1933  DATE OF PROCEDURE:  05/24/2011 DATE OF DISCHARGE:                              OPERATIVE REPORT   PREOPERATIVE DIAGNOSIS:  Spinal stenosis, L2-L3, L3-L4.  POSTOPERATIVE DIAGNOSIS:  Spinal stenosis, L2-L3, L3-L4.  OPERATION:  Decompressive laminectomy and foraminotomy, L2-L3, L3-L4.  SURGEON:  Marlowe Kays, M.D.  ASSISTANT:  Georges Lynch. Darrelyn Hillock, M.D.  ANESTHESIA:  General.  PATHOLOGY AND JUSTIFICATION FOR PROCEDURE:  I performed a prior decompressive laminectomy, mainly at L4-L5 with some extension up to L3- L4 and she did well for quite a few months, then has had progressive pain mainly in her thighs.  A myelogram CT scan performed at [facility] on April 27, 2011, has demonstrated spinal stenosis primarily at L3-L4 with foraminal stenosis at L2-L3. Accordingly, she is here for the above-mentioned surgery.  DESCRIPTION OF PROCEDURE:  Prophylactic antibiotics, satisfactory general anesthesia, Foley catheter inserted, prone position on rolls. Back was prepped with DuraPrep, draped in sterile field.  Ioban employed.  Time-out performed.  I made my initial incision at the proximal end of the prior incision and the original spinous process of L3 was palpable.  I then demarcated the spinous process of L3 and L2, dissecting soft tissue off the neural arches.  A confirmatory x-ray with Kocher clamps identified this as spinous process of L2 and L3 and completed the exposure and placed self-retaining McCullough retractor. Double-action rongeur then removed, the remaining portion of the spinous process of L3 and portion of L2 as well as a good bit of the laminae above.  We then brought in the microscope and continued the decompression, working  cephalad and caudad and with another confirmatory x-ray indicating that we had thoroughly decompressed at both ends.  As noted on the myelogram, she was extremely tight above the previous level of our decompression.  At the conclusion of our decompression, the foramina at L2-L3 and L3-L4 were widely patent and she was patent to hockey stick both proximally and distally.  There was no unusual bleeding during the case.  Wound was irrigated with sterile saline and the decompressive defect covered with Gelfoam soaked in thrombin.  Then removed the self-retaining retractors and closed the wound in layers with interrupted #1 Vicryl in 2 layers deep, 2-0 Vicryl superficially, staples in the skin.  Betadine, Adaptic, dry sterile dressing were applied.  She tolerated the procedure well, and was taken to recovery in satisfactory condition with no known complications.  Estimated blood loss was 125 mL.  No blood replacement.          ______________________________ Marlowe Kays, M.D.     JA/MEDQ  D:  05/24/2011  T:  05/25/2011  Job:  [id number]

## 2011-05-26 NOTE — Progress Notes (Signed)
Physical Therapy Treatment Patient Details Name: Kathryn [name] MRN: [id number] DOB: 06/01/1933 Today's Date: 05/26/2011  PT Assessment/Plan  PT - Assessment/Plan Comments on Treatment Session: initially pt qwas shaking and c/o feeling nausea. pt did amb. and stated she felt better. ready for dc. PT Plan: Discharge plan remains appropriate Follow Up Recommendations: No PT follow up PT Goals  Acute Rehab PT Goals Pt will Roll Supine to Left Side: Independently PT Goal: Rolling Supine to Left Side - Progress: Met Pt will go Supine/Side to Sit: Independently PT Goal: Supine/Side to Sit - Progress: Met Pt will go Sit to Stand: Independently PT Goal: Sit to Stand - Progress: Progressing toward goal Pt will Ambulate: >150 feet;with supervision PT Goal: Ambulate - Progress: Progressing toward goal  PT Treatment Precautions/Restrictions  Precautions Precautions: Back Restrictions Weight Bearing Restrictions: No Mobility (including Balance) Bed Mobility Supine to Sit: 5: Supervision Supine to Sit Details (indicate cue type and reason): vc on precautions Transfers Sit to Stand: 4: Min assist Stand to Sit: 5: Supervision;To chair/3-in-1;With armrests Ambulation/Gait Ambulation/Gait Assistance: 4: Min assist Ambulation/Gait Assistance Details (indicate cue type and reason): hand hold or rail, pt declined to use rw. Ambulation Distance (Feet): 200 Feet Assistive device: 1 person hand held assist    Exercise    End of Session PT - End of Session Activity Tolerance: Patient tolerated treatment well Patient left: in chair;with call bell in reach Nurse Communication: Mobility status for transfers General Behavior During Session: [facility] for tasks performed Cognition: [facility] for tasks performed  Rada Hay 05/26/2011, 4:47 PM

## 2011-05-26 NOTE — Progress Notes (Signed)
Subjective: 2 Days Post-Op Procedure(s) (LRB): LUMBAR LAMINECTOMY/DECOMPRESSION MICRODISCECTOMY 2 LEVELS (N/A)   Patient reports pain as mild. No events. Ready for discharge home.  Objective:   VITALS:   Filed Vitals:   05/26/11 0530  BP: 127/71  Pulse: 85  Temp: 98.2 F (36.8 C)  Resp: 18    Neurovascular intact Dorsiflexion/Plantar flexion intact Incision: dressing C/D/I No cellulitis present Compartment soft  LABS No new labs   Assessment/Plan: 2 Days Post-Op Procedure(s) (LRB): LUMBAR LAMINECTOMY/DECOMPRESSION MICRODISCECTOMY 2 LEVELS (N/A)   Up with therapy Discharge home with home health Follow up as instructed  Kathryn [name] Kathryn [name]   PAC  05/26/2011, 7:27 AM

## 2011-05-26 NOTE — Progress Notes (Signed)
Pt stable, scripts, and d/c instructions given.  No questions voiced by patient.  Pt refused home health PT and equipment.  Pt transported to private vehicle via wheelchair by NT and family.

## 2011-07-23 DIAGNOSIS — E039 Hypothyroidism, unspecified: Secondary | ICD-10-CM | POA: Diagnosis not present

## 2011-07-23 DIAGNOSIS — Z79899 Other long term (current) drug therapy: Secondary | ICD-10-CM | POA: Diagnosis not present

## 2011-07-23 DIAGNOSIS — I10 Essential (primary) hypertension: Secondary | ICD-10-CM | POA: Diagnosis not present

## 2011-07-23 DIAGNOSIS — J449 Chronic obstructive pulmonary disease, unspecified: Secondary | ICD-10-CM | POA: Diagnosis not present

## 2011-07-23 DIAGNOSIS — M549 Dorsalgia, unspecified: Secondary | ICD-10-CM | POA: Diagnosis not present

## 2011-07-23 DIAGNOSIS — M129 Arthropathy, unspecified: Secondary | ICD-10-CM | POA: Diagnosis not present

## 2011-08-08 DIAGNOSIS — Z961 Presence of intraocular lens: Secondary | ICD-10-CM | POA: Diagnosis not present

## 2011-08-22 ENCOUNTER — Ambulatory Visit: Payer: Self-pay | Admitting: Family Medicine

## 2011-08-22 DIAGNOSIS — Z1231 Encounter for screening mammogram for malignant neoplasm of breast: Secondary | ICD-10-CM | POA: Diagnosis not present

## 2011-09-03 DIAGNOSIS — M48061 Spinal stenosis, lumbar region without neurogenic claudication: Secondary | ICD-10-CM | POA: Diagnosis not present

## 2011-09-06 DIAGNOSIS — M48061 Spinal stenosis, lumbar region without neurogenic claudication: Secondary | ICD-10-CM | POA: Diagnosis not present

## 2011-09-11 DIAGNOSIS — M48061 Spinal stenosis, lumbar region without neurogenic claudication: Secondary | ICD-10-CM | POA: Diagnosis not present

## 2011-09-14 DIAGNOSIS — M48061 Spinal stenosis, lumbar region without neurogenic claudication: Secondary | ICD-10-CM | POA: Diagnosis not present

## 2011-09-18 DIAGNOSIS — M48061 Spinal stenosis, lumbar region without neurogenic claudication: Secondary | ICD-10-CM | POA: Diagnosis not present

## 2011-09-24 DIAGNOSIS — M48061 Spinal stenosis, lumbar region without neurogenic claudication: Secondary | ICD-10-CM | POA: Diagnosis not present

## 2011-09-28 DIAGNOSIS — M48061 Spinal stenosis, lumbar region without neurogenic claudication: Secondary | ICD-10-CM | POA: Diagnosis not present

## 2011-10-02 DIAGNOSIS — M48061 Spinal stenosis, lumbar region without neurogenic claudication: Secondary | ICD-10-CM | POA: Diagnosis not present

## 2011-10-11 DIAGNOSIS — Z85828 Personal history of other malignant neoplasm of skin: Secondary | ICD-10-CM | POA: Diagnosis not present

## 2011-10-11 DIAGNOSIS — L578 Other skin changes due to chronic exposure to nonionizing radiation: Secondary | ICD-10-CM | POA: Diagnosis not present

## 2011-10-11 DIAGNOSIS — L820 Inflamed seborrheic keratosis: Secondary | ICD-10-CM | POA: Diagnosis not present

## 2011-10-11 DIAGNOSIS — L570 Actinic keratosis: Secondary | ICD-10-CM | POA: Diagnosis not present

## 2011-10-23 DIAGNOSIS — M48061 Spinal stenosis, lumbar region without neurogenic claudication: Secondary | ICD-10-CM | POA: Diagnosis not present

## 2011-11-20 DIAGNOSIS — M48061 Spinal stenosis, lumbar region without neurogenic claudication: Secondary | ICD-10-CM | POA: Diagnosis not present

## 2011-12-20 DIAGNOSIS — Z23 Encounter for immunization: Secondary | ICD-10-CM | POA: Diagnosis not present

## 2011-12-25 DIAGNOSIS — Z0000 Encounter for general adult medical examination without abnormal findings: Secondary | ICD-10-CM | POA: Diagnosis not present

## 2011-12-25 DIAGNOSIS — M549 Dorsalgia, unspecified: Secondary | ICD-10-CM | POA: Diagnosis not present

## 2011-12-25 DIAGNOSIS — M129 Arthropathy, unspecified: Secondary | ICD-10-CM | POA: Diagnosis not present

## 2011-12-25 DIAGNOSIS — Z1331 Encounter for screening for depression: Secondary | ICD-10-CM | POA: Diagnosis not present

## 2011-12-25 DIAGNOSIS — Z1339 Encounter for screening examination for other mental health and behavioral disorders: Secondary | ICD-10-CM | POA: Diagnosis not present

## 2011-12-25 DIAGNOSIS — I10 Essential (primary) hypertension: Secondary | ICD-10-CM | POA: Diagnosis not present

## 2012-01-17 ENCOUNTER — Ambulatory Visit: Payer: Self-pay | Admitting: Family Medicine

## 2012-01-17 DIAGNOSIS — N951 Menopausal and female climacteric states: Secondary | ICD-10-CM | POA: Diagnosis not present

## 2012-01-17 DIAGNOSIS — M810 Age-related osteoporosis without current pathological fracture: Secondary | ICD-10-CM | POA: Diagnosis not present

## 2012-01-22 DIAGNOSIS — M999 Biomechanical lesion, unspecified: Secondary | ICD-10-CM | POA: Diagnosis not present

## 2012-01-22 DIAGNOSIS — M5137 Other intervertebral disc degeneration, lumbosacral region: Secondary | ICD-10-CM | POA: Diagnosis not present

## 2012-01-22 DIAGNOSIS — IMO0001 Reserved for inherently not codable concepts without codable children: Secondary | ICD-10-CM | POA: Diagnosis not present

## 2012-01-30 DIAGNOSIS — M5137 Other intervertebral disc degeneration, lumbosacral region: Secondary | ICD-10-CM | POA: Diagnosis not present

## 2012-01-30 DIAGNOSIS — IMO0001 Reserved for inherently not codable concepts without codable children: Secondary | ICD-10-CM | POA: Diagnosis not present

## 2012-01-30 DIAGNOSIS — M999 Biomechanical lesion, unspecified: Secondary | ICD-10-CM | POA: Diagnosis not present

## 2012-02-01 DIAGNOSIS — M5137 Other intervertebral disc degeneration, lumbosacral region: Secondary | ICD-10-CM | POA: Diagnosis not present

## 2012-02-01 DIAGNOSIS — M999 Biomechanical lesion, unspecified: Secondary | ICD-10-CM | POA: Diagnosis not present

## 2012-02-01 DIAGNOSIS — IMO0001 Reserved for inherently not codable concepts without codable children: Secondary | ICD-10-CM | POA: Diagnosis not present

## 2012-02-14 DIAGNOSIS — IMO0002 Reserved for concepts with insufficient information to code with codable children: Secondary | ICD-10-CM | POA: Diagnosis not present

## 2012-03-05 DIAGNOSIS — I10 Essential (primary) hypertension: Secondary | ICD-10-CM | POA: Diagnosis not present

## 2012-03-11 DIAGNOSIS — IMO0002 Reserved for concepts with insufficient information to code with codable children: Secondary | ICD-10-CM | POA: Diagnosis not present

## 2012-03-31 DIAGNOSIS — Z5189 Encounter for other specified aftercare: Secondary | ICD-10-CM | POA: Diagnosis not present

## 2012-03-31 DIAGNOSIS — M48061 Spinal stenosis, lumbar region without neurogenic claudication: Secondary | ICD-10-CM | POA: Diagnosis not present

## 2012-04-14 DIAGNOSIS — M48061 Spinal stenosis, lumbar region without neurogenic claudication: Secondary | ICD-10-CM | POA: Diagnosis not present

## 2012-04-28 DIAGNOSIS — Z5189 Encounter for other specified aftercare: Secondary | ICD-10-CM | POA: Diagnosis not present

## 2012-05-05 DIAGNOSIS — M545 Low back pain, unspecified: Secondary | ICD-10-CM | POA: Diagnosis not present

## 2012-05-28 DIAGNOSIS — E785 Hyperlipidemia, unspecified: Secondary | ICD-10-CM | POA: Diagnosis not present

## 2012-05-28 DIAGNOSIS — I10 Essential (primary) hypertension: Secondary | ICD-10-CM | POA: Diagnosis not present

## 2012-05-28 DIAGNOSIS — M545 Low back pain, unspecified: Secondary | ICD-10-CM | POA: Diagnosis not present

## 2012-05-28 DIAGNOSIS — E039 Hypothyroidism, unspecified: Secondary | ICD-10-CM | POA: Diagnosis not present

## 2012-05-29 DIAGNOSIS — L819 Disorder of pigmentation, unspecified: Secondary | ICD-10-CM | POA: Diagnosis not present

## 2012-05-29 DIAGNOSIS — L570 Actinic keratosis: Secondary | ICD-10-CM | POA: Diagnosis not present

## 2012-05-29 DIAGNOSIS — L821 Other seborrheic keratosis: Secondary | ICD-10-CM | POA: Diagnosis not present

## 2012-05-29 DIAGNOSIS — L578 Other skin changes due to chronic exposure to nonionizing radiation: Secondary | ICD-10-CM | POA: Diagnosis not present

## 2012-05-29 DIAGNOSIS — Z85828 Personal history of other malignant neoplasm of skin: Secondary | ICD-10-CM | POA: Diagnosis not present

## 2012-05-29 DIAGNOSIS — L820 Inflamed seborrheic keratosis: Secondary | ICD-10-CM | POA: Diagnosis not present

## 2012-06-04 DIAGNOSIS — M48062 Spinal stenosis, lumbar region with neurogenic claudication: Secondary | ICD-10-CM | POA: Diagnosis not present

## 2012-06-04 DIAGNOSIS — IMO0002 Reserved for concepts with insufficient information to code with codable children: Secondary | ICD-10-CM | POA: Diagnosis not present

## 2012-06-04 DIAGNOSIS — M47817 Spondylosis without myelopathy or radiculopathy, lumbosacral region: Secondary | ICD-10-CM | POA: Diagnosis not present

## 2012-06-05 DIAGNOSIS — E039 Hypothyroidism, unspecified: Secondary | ICD-10-CM | POA: Diagnosis not present

## 2012-06-05 DIAGNOSIS — M129 Arthropathy, unspecified: Secondary | ICD-10-CM | POA: Diagnosis not present

## 2012-06-05 DIAGNOSIS — I10 Essential (primary) hypertension: Secondary | ICD-10-CM | POA: Diagnosis not present

## 2012-06-05 DIAGNOSIS — J449 Chronic obstructive pulmonary disease, unspecified: Secondary | ICD-10-CM | POA: Diagnosis not present

## 2012-06-06 DIAGNOSIS — R5383 Other fatigue: Secondary | ICD-10-CM | POA: Diagnosis not present

## 2012-06-06 DIAGNOSIS — E039 Hypothyroidism, unspecified: Secondary | ICD-10-CM | POA: Diagnosis not present

## 2012-06-06 DIAGNOSIS — E7800 Pure hypercholesterolemia, unspecified: Secondary | ICD-10-CM | POA: Diagnosis not present

## 2012-06-06 DIAGNOSIS — I10 Essential (primary) hypertension: Secondary | ICD-10-CM | POA: Diagnosis not present

## 2012-06-06 DIAGNOSIS — R5381 Other malaise: Secondary | ICD-10-CM | POA: Diagnosis not present

## 2012-06-12 ENCOUNTER — Encounter: Payer: Self-pay | Admitting: Neurology

## 2012-06-12 DIAGNOSIS — IMO0001 Reserved for inherently not codable concepts without codable children: Secondary | ICD-10-CM | POA: Diagnosis not present

## 2012-06-12 DIAGNOSIS — M545 Low back pain, unspecified: Secondary | ICD-10-CM | POA: Diagnosis not present

## 2012-06-12 DIAGNOSIS — M6281 Muscle weakness (generalized): Secondary | ICD-10-CM | POA: Diagnosis not present

## 2012-06-12 DIAGNOSIS — M25659 Stiffness of unspecified hip, not elsewhere classified: Secondary | ICD-10-CM | POA: Diagnosis not present

## 2012-06-12 DIAGNOSIS — R262 Difficulty in walking, not elsewhere classified: Secondary | ICD-10-CM | POA: Diagnosis not present

## 2012-06-17 DIAGNOSIS — M6281 Muscle weakness (generalized): Secondary | ICD-10-CM | POA: Diagnosis not present

## 2012-06-17 DIAGNOSIS — M545 Low back pain, unspecified: Secondary | ICD-10-CM | POA: Diagnosis not present

## 2012-06-17 DIAGNOSIS — M25659 Stiffness of unspecified hip, not elsewhere classified: Secondary | ICD-10-CM | POA: Diagnosis not present

## 2012-06-17 DIAGNOSIS — IMO0001 Reserved for inherently not codable concepts without codable children: Secondary | ICD-10-CM | POA: Diagnosis not present

## 2012-06-17 DIAGNOSIS — R262 Difficulty in walking, not elsewhere classified: Secondary | ICD-10-CM | POA: Diagnosis not present

## 2012-06-18 DIAGNOSIS — R569 Unspecified convulsions: Secondary | ICD-10-CM | POA: Diagnosis not present

## 2012-06-18 DIAGNOSIS — M129 Arthropathy, unspecified: Secondary | ICD-10-CM | POA: Diagnosis not present

## 2012-06-18 DIAGNOSIS — N390 Urinary tract infection, site not specified: Secondary | ICD-10-CM | POA: Diagnosis not present

## 2012-06-18 DIAGNOSIS — M545 Low back pain, unspecified: Secondary | ICD-10-CM | POA: Diagnosis not present

## 2012-06-18 DIAGNOSIS — E669 Obesity, unspecified: Secondary | ICD-10-CM | POA: Diagnosis not present

## 2012-06-18 DIAGNOSIS — I10 Essential (primary) hypertension: Secondary | ICD-10-CM | POA: Diagnosis not present

## 2012-06-18 DIAGNOSIS — I2510 Atherosclerotic heart disease of native coronary artery without angina pectoris: Secondary | ICD-10-CM | POA: Diagnosis not present

## 2012-06-18 DIAGNOSIS — E785 Hyperlipidemia, unspecified: Secondary | ICD-10-CM | POA: Diagnosis not present

## 2012-06-19 DIAGNOSIS — N390 Urinary tract infection, site not specified: Secondary | ICD-10-CM | POA: Diagnosis not present

## 2012-06-24 ENCOUNTER — Encounter: Payer: Self-pay | Admitting: Neurology

## 2012-06-24 DIAGNOSIS — M6281 Muscle weakness (generalized): Secondary | ICD-10-CM | POA: Diagnosis not present

## 2012-06-24 DIAGNOSIS — IMO0001 Reserved for inherently not codable concepts without codable children: Secondary | ICD-10-CM | POA: Diagnosis not present

## 2012-06-24 DIAGNOSIS — M545 Low back pain, unspecified: Secondary | ICD-10-CM | POA: Diagnosis not present

## 2012-06-24 DIAGNOSIS — R262 Difficulty in walking, not elsewhere classified: Secondary | ICD-10-CM | POA: Diagnosis not present

## 2012-06-24 DIAGNOSIS — M25659 Stiffness of unspecified hip, not elsewhere classified: Secondary | ICD-10-CM | POA: Diagnosis not present

## 2012-07-01 ENCOUNTER — Ambulatory Visit: Payer: Medicare Other | Admitting: Cardiovascular Disease

## 2012-07-01 DIAGNOSIS — I658 Occlusion and stenosis of other precerebral arteries: Secondary | ICD-10-CM | POA: Diagnosis not present

## 2012-07-01 DIAGNOSIS — I651 Occlusion and stenosis of basilar artery: Secondary | ICD-10-CM | POA: Diagnosis not present

## 2012-07-15 ENCOUNTER — Encounter: Payer: Self-pay | Admitting: Cardiovascular Disease

## 2012-07-15 ENCOUNTER — Ambulatory Visit (INDEPENDENT_AMBULATORY_CARE_PROVIDER_SITE_OTHER): Payer: Medicare Other | Admitting: Cardiovascular Disease

## 2012-07-15 VITALS — BP 110/62 | HR 104 | Ht 64.5 in | Wt 177.0 lb

## 2012-07-15 DIAGNOSIS — F17200 Nicotine dependence, unspecified, uncomplicated: Secondary | ICD-10-CM | POA: Diagnosis not present

## 2012-07-15 DIAGNOSIS — E785 Hyperlipidemia, unspecified: Secondary | ICD-10-CM | POA: Diagnosis not present

## 2012-07-15 DIAGNOSIS — R0602 Shortness of breath: Secondary | ICD-10-CM | POA: Insufficient documentation

## 2012-07-15 DIAGNOSIS — I10 Essential (primary) hypertension: Secondary | ICD-10-CM | POA: Insufficient documentation

## 2012-07-15 DIAGNOSIS — M549 Dorsalgia, unspecified: Secondary | ICD-10-CM | POA: Diagnosis not present

## 2012-07-15 NOTE — Assessment & Plan Note (Signed)
Blood pressure is well controlled on today's visit. No changes made to the medications. 

## 2012-07-15 NOTE — Assessment & Plan Note (Signed)
Significant shortness of breath with any exertion. Unable to exclude ischemia as a cause of her symptoms. Unable to treadmill. We will order a pharmacologic Myoview. If this shows no ischemia, she likely has underlying significant COPD. At that point, perhaps additional inhalers to be used to optimize her COPD treatment. Currently she reports that she uses nebulizers when necessary.

## 2012-07-15 NOTE — Progress Notes (Signed)
Patient ID: Kathryn [name], female    DOB: 19-Sep-1933, 79 y.o.   MRN: [id number]  HPI Comments: Kathryn [name] is a 59 short woman with long smoking history for 50 years he stopped 6-7 years ago, history of back surgery x2, now with chronic back pain with pain radiating down her left leg, possible hip arthritis, chronic and worsening shortness of breath over the past several years, hyperlipidemia who presents for evaluation of her shortness of breath.   She states that she is unable to walk refer secondary to her back and leg pain. She had recent cortisone shot and is undergoing physical therapy. She does have episodes when her neck gets stiff and in these episodes, sometimes everything goes "dark" for a short period of time. She did not lose consciousness and recovers. She has shortness of breath when she walks but uses a wheelchair most of the time secondary to back pain. No chest pain symptoms.  Echocardiogram March 2012 showing normal ejection fraction, mild MR and TR, normal right ventricular systolic pressures  Carotid ultrasound March 2012 showing no carotid disease  Recent lab work showing total cholesterol 167, LDL 76, HDL 64  EKG shows sinus tachycardia with rate 104 beats per minute with no significant ST or changes   Outpatient Encounter Prescriptions as of 07/15/2012  Medication Sig Dispense Refill  . acetaminophen (TYLENOL) 500 MG tablet Take 1,000 mg by mouth every 6 (six) hours as needed. Pain       . amitriptyline (ELAVIL) 100 MG tablet Take 100 mg by mouth at bedtime.       Marland Kitchen amLODipine (NORVASC) 5 MG tablet Take 5 mg by mouth daily before breakfast.      . atorvastatin (LIPITOR) 40 MG tablet Take 40 mg by mouth daily before breakfast.      . cetirizine (ZYRTEC) 10 MG tablet Take 10 mg by mouth at bedtime.       Marland Kitchen levothyroxine (SYNTHROID, LEVOTHROID) 100 MCG tablet Take 100 mcg by mouth daily before breakfast.      . lisinopril (PRINIVIL,ZESTRIL) 10 MG tablet Take 10 mg by  mouth at bedtime.      . meloxicam (MOBIC) 7.5 MG tablet Take 7.5 mg by mouth daily.       Marland Kitchen omeprazole (PRILOSEC) 20 MG capsule Take 20 mg by mouth daily.      Marland Kitchen oxymetazoline (AFRIN) 0.05 % nasal spray Place 1 spray into the nose at bedtime.       No facility-administered encounter medications on file as of 07/15/2012.     Review of Systems  Constitutional: Negative.   HENT: Negative.   Eyes: Negative.   Respiratory: Positive for shortness of breath.   Cardiovascular: Negative.   Gastrointestinal: Negative.   Musculoskeletal: Positive for back pain and gait problem.  Skin: Negative.   Neurological: Negative.   Psychiatric/Behavioral: Negative.   All other systems reviewed and are negative.     BP 110/62  Pulse 104  Ht 5' 4.5" (1.638 m)  Wt 177 lb (80.287 kg)  BMI 29.92 kg/m2  Physical Exam  Nursing note and vitals reviewed. Constitutional: She is oriented to person, place, and time. She appears well-developed and well-nourished.  HENT:  Head: Normocephalic.  Nose: Nose normal.  Mouth/Throat: Oropharynx is clear and moist.  Eyes: Conjunctivae are normal. Pupils are equal, round, and reactive to light.  Neck: Normal range of motion. Neck supple. No JVD present.  Cardiovascular: Normal rate, regular rhythm, S1 normal, S2 normal, normal heart  sounds and intact distal pulses.  Exam reveals no gallop and no friction rub.   No murmur heard. Pulmonary/Chest: Effort normal. No respiratory distress. She has decreased breath sounds. She has no wheezes. She has no rales. She exhibits no tenderness.  Abdominal: Soft. Bowel sounds are normal. She exhibits no distension. There is no tenderness.  Musculoskeletal: Normal range of motion. She exhibits no edema and no tenderness.  Lymphadenopathy:    She has no cervical adenopathy.  Neurological: She is alert and oriented to person, place, and time. Coordination normal.  Skin: Skin is warm and dry. No rash noted. No erythema.   Psychiatric: She has a normal mood and affect. Her behavior is normal. Judgment and thought content normal.    Assessment and Plan

## 2012-07-15 NOTE — Patient Instructions (Addendum)
You are doing well. No medication changes were made.  We will order a stress test next Wednesday Hold the amlodipine the morning of the test No food or caffeine the day of the test  Please call us if you have new issues that need to be addressed before your next appt.

## 2012-07-15 NOTE — Assessment & Plan Note (Signed)
We have encouraged her to stay on her statin.

## 2012-07-15 NOTE — Assessment & Plan Note (Signed)
Debilitating back pain. Unable to treadmill. May need surgery On her hip  Stress test to rule out ischemia

## 2012-07-15 NOTE — Assessment & Plan Note (Signed)
Long history of smoking, underlying COPD. Stopped several years ago

## 2012-07-23 ENCOUNTER — Ambulatory Visit: Payer: Self-pay | Admitting: Cardiovascular Disease

## 2012-07-23 DIAGNOSIS — R0602 Shortness of breath: Secondary | ICD-10-CM | POA: Diagnosis not present

## 2012-07-24 ENCOUNTER — Encounter: Payer: Self-pay | Admitting: Cardiovascular Disease

## 2012-08-04 DIAGNOSIS — M6281 Muscle weakness (generalized): Secondary | ICD-10-CM | POA: Diagnosis not present

## 2012-08-04 DIAGNOSIS — M25559 Pain in unspecified hip: Secondary | ICD-10-CM | POA: Diagnosis not present

## 2012-08-04 DIAGNOSIS — R569 Unspecified convulsions: Secondary | ICD-10-CM | POA: Diagnosis not present

## 2012-08-04 DIAGNOSIS — M545 Low back pain, unspecified: Secondary | ICD-10-CM | POA: Diagnosis not present

## 2012-08-04 DIAGNOSIS — E669 Obesity, unspecified: Secondary | ICD-10-CM | POA: Diagnosis not present

## 2012-08-13 DIAGNOSIS — M169 Osteoarthritis of hip, unspecified: Secondary | ICD-10-CM | POA: Diagnosis not present

## 2012-08-21 DIAGNOSIS — M129 Arthropathy, unspecified: Secondary | ICD-10-CM | POA: Diagnosis not present

## 2012-08-21 DIAGNOSIS — M549 Dorsalgia, unspecified: Secondary | ICD-10-CM | POA: Diagnosis not present

## 2012-08-21 DIAGNOSIS — I10 Essential (primary) hypertension: Secondary | ICD-10-CM | POA: Diagnosis not present

## 2012-08-21 DIAGNOSIS — F411 Generalized anxiety disorder: Secondary | ICD-10-CM | POA: Diagnosis not present

## 2012-08-25 ENCOUNTER — Ambulatory Visit: Payer: Self-pay | Admitting: Orthopedic Surgery

## 2012-08-25 DIAGNOSIS — M169 Osteoarthritis of hip, unspecified: Secondary | ICD-10-CM | POA: Diagnosis not present

## 2012-08-25 DIAGNOSIS — Z9889 Other specified postprocedural states: Secondary | ICD-10-CM | POA: Diagnosis not present

## 2012-08-25 DIAGNOSIS — Z79899 Other long term (current) drug therapy: Secondary | ICD-10-CM | POA: Diagnosis not present

## 2012-08-25 DIAGNOSIS — I10 Essential (primary) hypertension: Secondary | ICD-10-CM | POA: Diagnosis not present

## 2012-08-25 DIAGNOSIS — Z01812 Encounter for preprocedural laboratory examination: Secondary | ICD-10-CM | POA: Diagnosis not present

## 2012-08-25 LAB — BASIC METABOLIC PANEL
Anion Gap: 6 — ABNORMAL LOW (ref 7–16)
BUN: 19 mg/dL — ABNORMAL HIGH (ref 7–18)
Calcium, Total: 9.7 mg/dL (ref 8.5–10.1)
Chloride: 107 mmol/L (ref 98–107)
Co2: 24 mmol/L (ref 21–32)
Creatinine: 0.87 mg/dL (ref 0.60–1.30)
EGFR (African American): >60
EGFR (Non-African Amer.): >60
Glucose: 88 mg/dL (ref 65–99)
Osmolality: 275 (ref 275–301)
Potassium: 4.6 mmol/L (ref 3.5–5.1)
Sodium: 137 mmol/L (ref 136–145)

## 2012-08-25 LAB — CBC
HCT: 35.7 % (ref 35.0–47.0)
HGB: 11.6 g/dL — ABNORMAL LOW (ref 12.0–16.0)
MCH: 26.8 pg (ref 26.0–34.0)
MCHC: 32.6 g/dL (ref 32.0–36.0)
MCV: 82 fL (ref 80–100)
Platelet: 391 10*3/uL (ref 150–440)
RBC: 4.33 10*6/uL (ref 3.80–5.20)
RDW: 17.7 % — ABNORMAL HIGH (ref 11.5–14.5)
WBC: 8.9 10*3/uL (ref 3.6–11.0)

## 2012-08-25 LAB — URINALYSIS, COMPLETE
Bacteria: NONE SEEN
Bilirubin,UR: NEGATIVE
Blood: NEGATIVE
Glucose,UR: NEGATIVE mg/dL (ref 0–75)
Hyaline Cast: 3
Ketone: NEGATIVE
Nitrite: NEGATIVE
Ph: 5 (ref 4.5–8.0)
Protein: NEGATIVE
RBC,UR: 1 /HPF (ref 0–5)
Specific Gravity: 1.029 (ref 1.003–1.030)
Squamous Epithelial: 1
WBC UR: 2 /HPF (ref 0–5)

## 2012-08-25 LAB — MRSA PCR SCREENING

## 2012-08-25 LAB — SEDIMENTATION RATE: Erythrocyte Sed Rate: 38 mm/hr — ABNORMAL HIGH (ref 0–30)

## 2012-08-25 LAB — APTT: Activated PTT: 38 secs — ABNORMAL HIGH (ref 23.6–35.9)

## 2012-08-25 LAB — PROTIME-INR
INR: 1
Prothrombin Time: 13.2 secs (ref 11.5–14.7)

## 2012-09-11 ENCOUNTER — Inpatient Hospital Stay: Payer: Self-pay | Admitting: Orthopedic Surgery

## 2012-09-11 DIAGNOSIS — Z96649 Presence of unspecified artificial hip joint: Secondary | ICD-10-CM | POA: Diagnosis not present

## 2012-09-11 DIAGNOSIS — F329 Major depressive disorder, single episode, unspecified: Secondary | ICD-10-CM | POA: Diagnosis present

## 2012-09-11 DIAGNOSIS — M25559 Pain in unspecified hip: Secondary | ICD-10-CM | POA: Diagnosis not present

## 2012-09-11 DIAGNOSIS — M545 Low back pain, unspecified: Secondary | ICD-10-CM | POA: Diagnosis present

## 2012-09-11 DIAGNOSIS — Z9089 Acquired absence of other organs: Secondary | ICD-10-CM | POA: Diagnosis not present

## 2012-09-11 DIAGNOSIS — M1610 Unilateral primary osteoarthritis, unspecified hip: Secondary | ICD-10-CM | POA: Diagnosis not present

## 2012-09-11 DIAGNOSIS — M1710 Unilateral primary osteoarthritis, unspecified knee: Secondary | ICD-10-CM | POA: Diagnosis not present

## 2012-09-11 DIAGNOSIS — Z5189 Encounter for other specified aftercare: Secondary | ICD-10-CM | POA: Diagnosis not present

## 2012-09-11 DIAGNOSIS — Z79899 Other long term (current) drug therapy: Secondary | ICD-10-CM | POA: Diagnosis not present

## 2012-09-11 DIAGNOSIS — IMO0002 Reserved for concepts with insufficient information to code with codable children: Secondary | ICD-10-CM | POA: Diagnosis not present

## 2012-09-11 DIAGNOSIS — H269 Unspecified cataract: Secondary | ICD-10-CM | POA: Diagnosis present

## 2012-09-11 DIAGNOSIS — Z471 Aftercare following joint replacement surgery: Secondary | ICD-10-CM | POA: Diagnosis not present

## 2012-09-11 DIAGNOSIS — I10 Essential (primary) hypertension: Secondary | ICD-10-CM | POA: Diagnosis present

## 2012-09-11 DIAGNOSIS — M169 Osteoarthritis of hip, unspecified: Secondary | ICD-10-CM | POA: Diagnosis present

## 2012-09-11 DIAGNOSIS — R6889 Other general symptoms and signs: Secondary | ICD-10-CM | POA: Diagnosis not present

## 2012-09-12 LAB — BASIC METABOLIC PANEL
Anion Gap: 6 — ABNORMAL LOW (ref 7–16)
BUN: 9 mg/dL (ref 7–18)
Calcium, Total: 8.7 mg/dL (ref 8.5–10.1)
Chloride: 106 mmol/L (ref 98–107)
Co2: 25 mmol/L (ref 21–32)
Creatinine: 0.73 mg/dL (ref 0.60–1.30)
EGFR (African American): >60
EGFR (Non-African Amer.): >60
Glucose: 111 mg/dL — ABNORMAL HIGH (ref 65–99)
Osmolality: 273 (ref 275–301)
Potassium: 4.3 mmol/L (ref 3.5–5.1)
Sodium: 137 mmol/L (ref 136–145)

## 2012-09-12 LAB — HEMOGLOBIN: HGB: 9.7 g/dL — ABNORMAL LOW

## 2012-09-12 LAB — PLATELET COUNT: Platelet: 270 10*3/uL (ref 150–440)

## 2012-09-15 DIAGNOSIS — R6889 Other general symptoms and signs: Secondary | ICD-10-CM | POA: Diagnosis not present

## 2012-09-15 DIAGNOSIS — M1991 Primary osteoarthritis, unspecified site: Secondary | ICD-10-CM | POA: Diagnosis not present

## 2012-09-15 DIAGNOSIS — I10 Essential (primary) hypertension: Secondary | ICD-10-CM | POA: Diagnosis not present

## 2012-09-15 DIAGNOSIS — G4700 Insomnia, unspecified: Secondary | ICD-10-CM | POA: Diagnosis not present

## 2012-09-15 DIAGNOSIS — Z5189 Encounter for other specified aftercare: Secondary | ICD-10-CM | POA: Diagnosis not present

## 2012-09-15 LAB — PATHOLOGY REPORT

## 2012-09-16 ENCOUNTER — Encounter: Payer: Self-pay | Admitting: Internal Medicine

## 2012-09-16 DIAGNOSIS — I10 Essential (primary) hypertension: Secondary | ICD-10-CM | POA: Diagnosis not present

## 2012-09-16 DIAGNOSIS — M1991 Primary osteoarthritis, unspecified site: Secondary | ICD-10-CM | POA: Diagnosis not present

## 2012-09-23 ENCOUNTER — Encounter: Payer: Self-pay | Admitting: Internal Medicine

## 2012-10-12 DIAGNOSIS — Z471 Aftercare following joint replacement surgery: Secondary | ICD-10-CM | POA: Diagnosis not present

## 2012-10-12 DIAGNOSIS — Z5189 Encounter for other specified aftercare: Secondary | ICD-10-CM | POA: Diagnosis not present

## 2012-10-12 DIAGNOSIS — Z96649 Presence of unspecified artificial hip joint: Secondary | ICD-10-CM | POA: Diagnosis not present

## 2012-10-15 DIAGNOSIS — Z471 Aftercare following joint replacement surgery: Secondary | ICD-10-CM | POA: Diagnosis not present

## 2012-10-15 DIAGNOSIS — Z5189 Encounter for other specified aftercare: Secondary | ICD-10-CM | POA: Diagnosis not present

## 2012-10-15 DIAGNOSIS — Z96649 Presence of unspecified artificial hip joint: Secondary | ICD-10-CM | POA: Diagnosis not present

## 2012-10-21 DIAGNOSIS — Z471 Aftercare following joint replacement surgery: Secondary | ICD-10-CM | POA: Diagnosis not present

## 2012-10-21 DIAGNOSIS — Z96649 Presence of unspecified artificial hip joint: Secondary | ICD-10-CM | POA: Diagnosis not present

## 2012-10-21 DIAGNOSIS — Z5189 Encounter for other specified aftercare: Secondary | ICD-10-CM | POA: Diagnosis not present

## 2012-10-22 ENCOUNTER — Emergency Department: Payer: Self-pay | Admitting: Emergency Medicine

## 2012-10-22 DIAGNOSIS — I498 Other specified cardiac arrhythmias: Secondary | ICD-10-CM | POA: Diagnosis not present

## 2012-10-22 DIAGNOSIS — R918 Other nonspecific abnormal finding of lung field: Secondary | ICD-10-CM | POA: Diagnosis not present

## 2012-10-22 DIAGNOSIS — Z79899 Other long term (current) drug therapy: Secondary | ICD-10-CM | POA: Diagnosis not present

## 2012-10-22 DIAGNOSIS — J209 Acute bronchitis, unspecified: Secondary | ICD-10-CM | POA: Diagnosis not present

## 2012-10-22 DIAGNOSIS — R079 Chest pain, unspecified: Secondary | ICD-10-CM | POA: Diagnosis not present

## 2012-10-22 DIAGNOSIS — R059 Cough, unspecified: Secondary | ICD-10-CM | POA: Diagnosis not present

## 2012-10-22 DIAGNOSIS — R05 Cough: Secondary | ICD-10-CM | POA: Diagnosis not present

## 2012-10-22 DIAGNOSIS — I10 Essential (primary) hypertension: Secondary | ICD-10-CM | POA: Diagnosis not present

## 2012-10-22 DIAGNOSIS — J40 Bronchitis, not specified as acute or chronic: Secondary | ICD-10-CM | POA: Diagnosis not present

## 2012-10-22 DIAGNOSIS — Z5189 Encounter for other specified aftercare: Secondary | ICD-10-CM | POA: Diagnosis not present

## 2012-10-22 DIAGNOSIS — J984 Other disorders of lung: Secondary | ICD-10-CM | POA: Diagnosis not present

## 2012-10-22 DIAGNOSIS — R911 Solitary pulmonary nodule: Secondary | ICD-10-CM | POA: Diagnosis not present

## 2012-10-22 LAB — CBC
HCT: 29.8 % — ABNORMAL LOW (ref 35.0–47.0)
HGB: 9.7 g/dL — ABNORMAL LOW (ref 12.0–16.0)
MCH: 26.6 pg (ref 26.0–34.0)
MCHC: 32.6 g/dL (ref 32.0–36.0)
MCV: 82 fL (ref 80–100)
Platelet: 397 10*3/uL (ref 150–440)
RBC: 3.65 10*6/uL — ABNORMAL LOW (ref 3.80–5.20)
RDW: 18.4 % — ABNORMAL HIGH (ref 11.5–14.5)
WBC: 10.9 10*3/uL (ref 3.6–11.0)

## 2012-10-22 LAB — BASIC METABOLIC PANEL
Anion Gap: 8 (ref 7–16)
BUN: 16 mg/dL (ref 7–18)
Calcium, Total: 9.3 mg/dL (ref 8.5–10.1)
Chloride: 106 mmol/L (ref 98–107)
Co2: 23 mmol/L (ref 21–32)
Creatinine: 1.06 mg/dL (ref 0.60–1.30)
EGFR (African American): 59 — ABNORMAL LOW
EGFR (Non-African Amer.): 51 — ABNORMAL LOW
Glucose: 148 mg/dL — ABNORMAL HIGH (ref 65–99)
Osmolality: 278 (ref 275–301)
Potassium: 3.7 mmol/L (ref 3.5–5.1)
Sodium: 137 mmol/L (ref 136–145)

## 2012-10-22 LAB — TROPONIN I: Troponin-I: <0.02 ng/mL

## 2012-10-22 LAB — PROTIME-INR
INR: 1.8
Prothrombin Time: 20.4 secs — ABNORMAL HIGH (ref 11.5–14.7)

## 2012-10-22 LAB — CK TOTAL AND CKMB (NOT AT ARMC)
CK, Total: 49 U/L (ref 21–215)
CK-MB: 0.5 ng/mL (ref 0.5–3.6)

## 2012-10-22 LAB — APTT: Activated PTT: 55.1 secs — ABNORMAL HIGH (ref 23.6–35.9)

## 2012-10-23 DIAGNOSIS — Z5189 Encounter for other specified aftercare: Secondary | ICD-10-CM | POA: Diagnosis not present

## 2012-10-23 DIAGNOSIS — F411 Generalized anxiety disorder: Secondary | ICD-10-CM | POA: Diagnosis not present

## 2012-10-23 DIAGNOSIS — Z471 Aftercare following joint replacement surgery: Secondary | ICD-10-CM | POA: Diagnosis not present

## 2012-10-23 DIAGNOSIS — R059 Cough, unspecified: Secondary | ICD-10-CM | POA: Diagnosis not present

## 2012-10-23 DIAGNOSIS — J209 Acute bronchitis, unspecified: Secondary | ICD-10-CM | POA: Diagnosis not present

## 2012-10-23 DIAGNOSIS — Z96649 Presence of unspecified artificial hip joint: Secondary | ICD-10-CM | POA: Diagnosis not present

## 2012-10-23 DIAGNOSIS — R05 Cough: Secondary | ICD-10-CM | POA: Diagnosis not present

## 2012-10-23 DIAGNOSIS — R911 Solitary pulmonary nodule: Secondary | ICD-10-CM | POA: Diagnosis not present

## 2012-10-24 ENCOUNTER — Ambulatory Visit: Payer: Self-pay | Admitting: Internal Medicine

## 2012-10-24 DIAGNOSIS — M169 Osteoarthritis of hip, unspecified: Secondary | ICD-10-CM | POA: Diagnosis not present

## 2012-10-24 DIAGNOSIS — Z96659 Presence of unspecified artificial knee joint: Secondary | ICD-10-CM | POA: Diagnosis not present

## 2012-10-27 DIAGNOSIS — Z96649 Presence of unspecified artificial hip joint: Secondary | ICD-10-CM | POA: Diagnosis not present

## 2012-10-27 DIAGNOSIS — Z471 Aftercare following joint replacement surgery: Secondary | ICD-10-CM | POA: Diagnosis not present

## 2012-10-27 DIAGNOSIS — Z5189 Encounter for other specified aftercare: Secondary | ICD-10-CM | POA: Diagnosis not present

## 2012-10-30 LAB — CULTURE, BLOOD (SINGLE)

## 2012-11-04 ENCOUNTER — Ambulatory Visit: Payer: Self-pay | Admitting: Orthopedic Surgery

## 2012-11-04 DIAGNOSIS — M169 Osteoarthritis of hip, unspecified: Secondary | ICD-10-CM | POA: Diagnosis not present

## 2012-11-04 DIAGNOSIS — Z01812 Encounter for preprocedural laboratory examination: Secondary | ICD-10-CM | POA: Diagnosis not present

## 2012-11-04 LAB — CBC
HCT: 30.3 % — ABNORMAL LOW (ref 35.0–47.0)
HGB: 9.8 g/dL — ABNORMAL LOW (ref 12.0–16.0)
MCH: 26.1 pg (ref 26.0–34.0)
MCHC: 32.4 g/dL (ref 32.0–36.0)
MCV: 80 fL (ref 80–100)
Platelet: 777 10*3/uL — ABNORMAL HIGH (ref 150–440)
RBC: 3.77 10*6/uL — ABNORMAL LOW (ref 3.80–5.20)
RDW: 18.2 % — ABNORMAL HIGH (ref 11.5–14.5)
WBC: 6 10*3/uL (ref 3.6–11.0)

## 2012-11-04 LAB — URINALYSIS, COMPLETE
Bilirubin,UR: NEGATIVE
Blood: NEGATIVE
Glucose,UR: NEGATIVE mg/dL (ref 0–75)
Ketone: NEGATIVE
Nitrite: NEGATIVE
Ph: 5 (ref 4.5–8.0)
Protein: NEGATIVE
RBC,UR: NONE SEEN /HPF (ref 0–5)
Specific Gravity: 1.027 (ref 1.003–1.030)
Squamous Epithelial: 1
WBC UR: 1 /HPF (ref 0–5)

## 2012-11-04 LAB — BASIC METABOLIC PANEL
Anion Gap: 6 — ABNORMAL LOW (ref 7–16)
BUN: 16 mg/dL (ref 7–18)
Calcium, Total: 9.3 mg/dL (ref 8.5–10.1)
Chloride: 107 mmol/L (ref 98–107)
Co2: 25 mmol/L (ref 21–32)
Creatinine: 0.81 mg/dL (ref 0.60–1.30)
EGFR (African American): >60
EGFR (Non-African Amer.): >60
Glucose: 96 mg/dL (ref 65–99)
Osmolality: 277 (ref 275–301)
Potassium: 4.1 mmol/L (ref 3.5–5.1)
Sodium: 138 mmol/L (ref 136–145)

## 2012-11-04 LAB — MRSA PCR SCREENING

## 2012-11-04 LAB — PROTIME-INR
INR: 0.9
Prothrombin Time: 12.6 secs (ref 11.5–14.7)

## 2012-11-04 LAB — SEDIMENTATION RATE: Erythrocyte Sed Rate: 76 mm/hr — ABNORMAL HIGH (ref 0–30)

## 2012-11-04 LAB — APTT: Activated PTT: 34.2 secs (ref 23.6–35.9)

## 2012-11-06 LAB — URINE CULTURE

## 2012-11-10 DIAGNOSIS — Z01818 Encounter for other preprocedural examination: Secondary | ICD-10-CM | POA: Diagnosis not present

## 2012-11-10 DIAGNOSIS — D649 Anemia, unspecified: Secondary | ICD-10-CM | POA: Diagnosis not present

## 2012-11-10 DIAGNOSIS — D473 Essential (hemorrhagic) thrombocythemia: Secondary | ICD-10-CM | POA: Diagnosis not present

## 2012-11-10 DIAGNOSIS — R700 Elevated erythrocyte sedimentation rate: Secondary | ICD-10-CM | POA: Diagnosis not present

## 2012-11-10 DIAGNOSIS — R911 Solitary pulmonary nodule: Secondary | ICD-10-CM | POA: Diagnosis not present

## 2012-11-10 DIAGNOSIS — F411 Generalized anxiety disorder: Secondary | ICD-10-CM | POA: Diagnosis not present

## 2012-11-10 DIAGNOSIS — D509 Iron deficiency anemia, unspecified: Secondary | ICD-10-CM | POA: Diagnosis not present

## 2012-11-10 DIAGNOSIS — R059 Cough, unspecified: Secondary | ICD-10-CM | POA: Diagnosis not present

## 2012-11-10 DIAGNOSIS — R05 Cough: Secondary | ICD-10-CM | POA: Diagnosis not present

## 2012-11-11 ENCOUNTER — Ambulatory Visit: Payer: Self-pay | Admitting: Internal Medicine

## 2012-11-11 DIAGNOSIS — Z96649 Presence of unspecified artificial hip joint: Secondary | ICD-10-CM | POA: Diagnosis not present

## 2012-11-11 DIAGNOSIS — Z79899 Other long term (current) drug therapy: Secondary | ICD-10-CM | POA: Diagnosis not present

## 2012-11-11 DIAGNOSIS — D509 Iron deficiency anemia, unspecified: Secondary | ICD-10-CM | POA: Diagnosis not present

## 2012-11-11 DIAGNOSIS — G8929 Other chronic pain: Secondary | ICD-10-CM | POA: Diagnosis not present

## 2012-11-11 DIAGNOSIS — E7800 Pure hypercholesterolemia, unspecified: Secondary | ICD-10-CM | POA: Diagnosis not present

## 2012-11-11 DIAGNOSIS — M545 Low back pain, unspecified: Secondary | ICD-10-CM | POA: Diagnosis not present

## 2012-11-11 DIAGNOSIS — M5137 Other intervertebral disc degeneration, lumbosacral region: Secondary | ICD-10-CM | POA: Diagnosis not present

## 2012-11-11 DIAGNOSIS — R634 Abnormal weight loss: Secondary | ICD-10-CM | POA: Diagnosis not present

## 2012-11-11 DIAGNOSIS — Z8673 Personal history of transient ischemic attack (TIA), and cerebral infarction without residual deficits: Secondary | ICD-10-CM | POA: Diagnosis not present

## 2012-11-11 DIAGNOSIS — M25559 Pain in unspecified hip: Secondary | ICD-10-CM | POA: Diagnosis not present

## 2012-11-11 DIAGNOSIS — E039 Hypothyroidism, unspecified: Secondary | ICD-10-CM | POA: Diagnosis not present

## 2012-11-11 DIAGNOSIS — R942 Abnormal results of pulmonary function studies: Secondary | ICD-10-CM | POA: Diagnosis not present

## 2012-11-11 DIAGNOSIS — D473 Essential (hemorrhagic) thrombocythemia: Secondary | ICD-10-CM | POA: Diagnosis not present

## 2012-11-11 DIAGNOSIS — R630 Anorexia: Secondary | ICD-10-CM | POA: Diagnosis not present

## 2012-11-11 DIAGNOSIS — F329 Major depressive disorder, single episode, unspecified: Secondary | ICD-10-CM | POA: Diagnosis not present

## 2012-11-11 DIAGNOSIS — R0609 Other forms of dyspnea: Secondary | ICD-10-CM | POA: Diagnosis not present

## 2012-11-20 DIAGNOSIS — M129 Arthropathy, unspecified: Secondary | ICD-10-CM | POA: Diagnosis not present

## 2012-11-20 DIAGNOSIS — D649 Anemia, unspecified: Secondary | ICD-10-CM | POA: Diagnosis not present

## 2012-11-20 DIAGNOSIS — D473 Essential (hemorrhagic) thrombocythemia: Secondary | ICD-10-CM | POA: Diagnosis not present

## 2012-11-20 DIAGNOSIS — F411 Generalized anxiety disorder: Secondary | ICD-10-CM | POA: Diagnosis not present

## 2012-11-20 DIAGNOSIS — I10 Essential (primary) hypertension: Secondary | ICD-10-CM | POA: Diagnosis not present

## 2012-11-24 ENCOUNTER — Ambulatory Visit: Payer: Self-pay | Admitting: Internal Medicine

## 2012-12-11 ENCOUNTER — Ambulatory Visit: Payer: Self-pay | Admitting: Orthopedic Surgery

## 2012-12-11 DIAGNOSIS — IMO0002 Reserved for concepts with insufficient information to code with codable children: Secondary | ICD-10-CM | POA: Diagnosis not present

## 2012-12-11 DIAGNOSIS — M79609 Pain in unspecified limb: Secondary | ICD-10-CM | POA: Diagnosis not present

## 2012-12-11 DIAGNOSIS — Z01812 Encounter for preprocedural laboratory examination: Secondary | ICD-10-CM | POA: Diagnosis not present

## 2012-12-11 DIAGNOSIS — Z9889 Other specified postprocedural states: Secondary | ICD-10-CM | POA: Diagnosis not present

## 2012-12-11 DIAGNOSIS — Z79899 Other long term (current) drug therapy: Secondary | ICD-10-CM | POA: Diagnosis not present

## 2012-12-11 DIAGNOSIS — I10 Essential (primary) hypertension: Secondary | ICD-10-CM | POA: Diagnosis not present

## 2012-12-11 LAB — URINALYSIS, COMPLETE
Bacteria: NONE SEEN
Glucose,UR: NEGATIVE mg/dL (ref 0–75)
Hyaline Cast: 2
Nitrite: NEGATIVE
Ph: 5 (ref 4.5–8.0)
Protein: 30
RBC,UR: 2 /HPF (ref 0–5)
Specific Gravity: 1.034 (ref 1.003–1.030)
Squamous Epithelial: 4
WBC UR: 2 /HPF (ref 0–5)

## 2012-12-11 LAB — CBC
HCT: 40.6 % (ref 35.0–47.0)
HGB: 13.2 g/dL (ref 12.0–16.0)
MCH: 28.8 pg (ref 26.0–34.0)
MCHC: 32.4 g/dL (ref 32.0–36.0)
MCV: 89 fL (ref 80–100)
Platelet: 358 10*3/uL (ref 150–440)
RBC: 4.57 10*6/uL (ref 3.80–5.20)
RDW: 23.4 % — ABNORMAL HIGH (ref 11.5–14.5)
WBC: 7.3 10*3/uL (ref 3.6–11.0)

## 2012-12-11 LAB — BASIC METABOLIC PANEL
Anion Gap: 6 — ABNORMAL LOW (ref 7–16)
BUN: 14 mg/dL (ref 7–18)
Calcium, Total: 9.9 mg/dL (ref 8.5–10.1)
Chloride: 106 mmol/L (ref 98–107)
Co2: 25 mmol/L (ref 21–32)
Creatinine: 0.74 mg/dL (ref 0.60–1.30)
EGFR (African American): >60
EGFR (Non-African Amer.): >60
Glucose: 130 mg/dL — ABNORMAL HIGH (ref 65–99)
Osmolality: 276 (ref 275–301)
Potassium: 3.9 mmol/L (ref 3.5–5.1)
Sodium: 137 mmol/L (ref 136–145)

## 2012-12-11 LAB — PROTIME-INR
INR: 0.9
Prothrombin Time: 12.8 secs (ref 11.5–14.7)

## 2012-12-11 LAB — APTT: Activated PTT: 35.3 secs (ref 23.6–35.9)

## 2012-12-11 LAB — SEDIMENTATION RATE: Erythrocyte Sed Rate: 20 mm/hr (ref 0–30)

## 2012-12-11 LAB — MRSA PCR SCREENING

## 2012-12-25 ENCOUNTER — Inpatient Hospital Stay: Payer: Self-pay | Admitting: Orthopedic Surgery

## 2012-12-25 DIAGNOSIS — K259 Gastric ulcer, unspecified as acute or chronic, without hemorrhage or perforation: Secondary | ICD-10-CM | POA: Diagnosis not present

## 2012-12-25 DIAGNOSIS — M6281 Muscle weakness (generalized): Secondary | ICD-10-CM | POA: Diagnosis not present

## 2012-12-25 DIAGNOSIS — J449 Chronic obstructive pulmonary disease, unspecified: Secondary | ICD-10-CM | POA: Diagnosis not present

## 2012-12-25 DIAGNOSIS — D62 Acute posthemorrhagic anemia: Secondary | ICD-10-CM | POA: Diagnosis not present

## 2012-12-25 DIAGNOSIS — Z5189 Encounter for other specified aftercare: Secondary | ICD-10-CM | POA: Diagnosis not present

## 2012-12-25 DIAGNOSIS — R269 Unspecified abnormalities of gait and mobility: Secondary | ICD-10-CM | POA: Diagnosis not present

## 2012-12-25 DIAGNOSIS — I10 Essential (primary) hypertension: Secondary | ICD-10-CM | POA: Diagnosis present

## 2012-12-25 DIAGNOSIS — Z96649 Presence of unspecified artificial hip joint: Secondary | ICD-10-CM | POA: Diagnosis not present

## 2012-12-25 DIAGNOSIS — M169 Osteoarthritis of hip, unspecified: Secondary | ICD-10-CM | POA: Diagnosis not present

## 2012-12-25 DIAGNOSIS — Z23 Encounter for immunization: Secondary | ICD-10-CM | POA: Diagnosis not present

## 2012-12-25 DIAGNOSIS — Z471 Aftercare following joint replacement surgery: Secondary | ICD-10-CM | POA: Diagnosis not present

## 2012-12-25 DIAGNOSIS — F329 Major depressive disorder, single episode, unspecified: Secondary | ICD-10-CM | POA: Diagnosis not present

## 2012-12-26 LAB — BASIC METABOLIC PANEL
Anion Gap: 8 (ref 7–16)
BUN: 11 mg/dL (ref 7–18)
Calcium, Total: 9.2 mg/dL (ref 8.5–10.1)
Chloride: 103 mmol/L (ref 98–107)
Co2: 25 mmol/L (ref 21–32)
Creatinine: 0.76 mg/dL (ref 0.60–1.30)
EGFR (African American): >60
EGFR (Non-African Amer.): >60
Glucose: 109 mg/dL — ABNORMAL HIGH (ref 65–99)
Osmolality: 272 (ref 275–301)
Potassium: 4.1 mmol/L (ref 3.5–5.1)
Sodium: 136 mmol/L (ref 136–145)

## 2012-12-26 LAB — HEMOGLOBIN: HGB: 11.4 g/dL — ABNORMAL LOW (ref 12.0–16.0)

## 2012-12-26 LAB — PLATELET COUNT: Platelet: 222 10*3/uL (ref 150–440)

## 2012-12-29 DIAGNOSIS — D62 Acute posthemorrhagic anemia: Secondary | ICD-10-CM | POA: Diagnosis not present

## 2012-12-29 DIAGNOSIS — R269 Unspecified abnormalities of gait and mobility: Secondary | ICD-10-CM | POA: Diagnosis not present

## 2012-12-29 DIAGNOSIS — I10 Essential (primary) hypertension: Secondary | ICD-10-CM | POA: Diagnosis not present

## 2012-12-29 DIAGNOSIS — M6281 Muscle weakness (generalized): Secondary | ICD-10-CM | POA: Diagnosis not present

## 2012-12-29 DIAGNOSIS — J449 Chronic obstructive pulmonary disease, unspecified: Secondary | ICD-10-CM | POA: Diagnosis not present

## 2012-12-29 DIAGNOSIS — Z96649 Presence of unspecified artificial hip joint: Secondary | ICD-10-CM | POA: Diagnosis not present

## 2012-12-29 DIAGNOSIS — Z5189 Encounter for other specified aftercare: Secondary | ICD-10-CM | POA: Diagnosis not present

## 2012-12-29 DIAGNOSIS — Z471 Aftercare following joint replacement surgery: Secondary | ICD-10-CM | POA: Diagnosis not present

## 2012-12-29 DIAGNOSIS — M1991 Primary osteoarthritis, unspecified site: Secondary | ICD-10-CM | POA: Diagnosis not present

## 2012-12-29 DIAGNOSIS — K259 Gastric ulcer, unspecified as acute or chronic, without hemorrhage or perforation: Secondary | ICD-10-CM | POA: Diagnosis not present

## 2012-12-29 DIAGNOSIS — F329 Major depressive disorder, single episode, unspecified: Secondary | ICD-10-CM | POA: Diagnosis not present

## 2012-12-29 DIAGNOSIS — Z23 Encounter for immunization: Secondary | ICD-10-CM | POA: Diagnosis not present

## 2012-12-29 DIAGNOSIS — M169 Osteoarthritis of hip, unspecified: Secondary | ICD-10-CM | POA: Diagnosis not present

## 2012-12-30 ENCOUNTER — Encounter: Payer: Self-pay | Admitting: Internal Medicine

## 2012-12-30 DIAGNOSIS — I10 Essential (primary) hypertension: Secondary | ICD-10-CM | POA: Diagnosis not present

## 2012-12-30 DIAGNOSIS — M1991 Primary osteoarthritis, unspecified site: Secondary | ICD-10-CM | POA: Diagnosis not present

## 2013-01-06 LAB — CBC WITH DIFFERENTIAL/PLATELET
Eosinophil: 1 %
HCT: 35.4 % (ref 35.0–47.0)
HGB: 11.7 g/dL — ABNORMAL LOW (ref 12.0–16.0)
Lymphocytes: 18 %
MCH: 29.7 pg (ref 26.0–34.0)
MCHC: 33 g/dL (ref 32.0–36.0)
MCV: 90 fL (ref 80–100)
Monocytes: 13 %
Platelet: 622 10*3/uL — ABNORMAL HIGH (ref 150–440)
RBC: 3.92 10*6/uL (ref 3.80–5.20)
RDW: 20.9 % — ABNORMAL HIGH (ref 11.5–14.5)
Segmented Neutrophils: 67 %
Variant Lymphocyte - H1-Rlymph: 1 %
WBC: 7.2 10*3/uL (ref 3.6–11.0)

## 2013-01-14 DIAGNOSIS — Z96649 Presence of unspecified artificial hip joint: Secondary | ICD-10-CM | POA: Diagnosis not present

## 2013-01-14 DIAGNOSIS — M25659 Stiffness of unspecified hip, not elsewhere classified: Secondary | ICD-10-CM | POA: Diagnosis not present

## 2013-01-14 DIAGNOSIS — M25559 Pain in unspecified hip: Secondary | ICD-10-CM | POA: Diagnosis not present

## 2013-01-14 DIAGNOSIS — M6281 Muscle weakness (generalized): Secondary | ICD-10-CM | POA: Diagnosis not present

## 2013-01-20 DIAGNOSIS — Z96649 Presence of unspecified artificial hip joint: Secondary | ICD-10-CM | POA: Diagnosis not present

## 2013-01-20 DIAGNOSIS — M6281 Muscle weakness (generalized): Secondary | ICD-10-CM | POA: Diagnosis not present

## 2013-01-20 DIAGNOSIS — M25559 Pain in unspecified hip: Secondary | ICD-10-CM | POA: Diagnosis not present

## 2013-01-20 DIAGNOSIS — M25659 Stiffness of unspecified hip, not elsewhere classified: Secondary | ICD-10-CM | POA: Diagnosis not present

## 2013-01-22 DIAGNOSIS — M25559 Pain in unspecified hip: Secondary | ICD-10-CM | POA: Diagnosis not present

## 2013-01-22 DIAGNOSIS — Z96649 Presence of unspecified artificial hip joint: Secondary | ICD-10-CM | POA: Diagnosis not present

## 2013-01-22 DIAGNOSIS — M6281 Muscle weakness (generalized): Secondary | ICD-10-CM | POA: Diagnosis not present

## 2013-01-22 DIAGNOSIS — M25659 Stiffness of unspecified hip, not elsewhere classified: Secondary | ICD-10-CM | POA: Diagnosis not present

## 2013-01-28 DIAGNOSIS — M25659 Stiffness of unspecified hip, not elsewhere classified: Secondary | ICD-10-CM | POA: Diagnosis not present

## 2013-01-28 DIAGNOSIS — M6281 Muscle weakness (generalized): Secondary | ICD-10-CM | POA: Diagnosis not present

## 2013-01-28 DIAGNOSIS — M25559 Pain in unspecified hip: Secondary | ICD-10-CM | POA: Diagnosis not present

## 2013-01-28 DIAGNOSIS — Z96649 Presence of unspecified artificial hip joint: Secondary | ICD-10-CM | POA: Diagnosis not present

## 2013-01-30 DIAGNOSIS — Z96649 Presence of unspecified artificial hip joint: Secondary | ICD-10-CM | POA: Diagnosis not present

## 2013-01-30 DIAGNOSIS — M6281 Muscle weakness (generalized): Secondary | ICD-10-CM | POA: Diagnosis not present

## 2013-01-30 DIAGNOSIS — M25559 Pain in unspecified hip: Secondary | ICD-10-CM | POA: Diagnosis not present

## 2013-01-30 DIAGNOSIS — M25659 Stiffness of unspecified hip, not elsewhere classified: Secondary | ICD-10-CM | POA: Diagnosis not present

## 2013-02-03 DIAGNOSIS — M25619 Stiffness of unspecified shoulder, not elsewhere classified: Secondary | ICD-10-CM | POA: Diagnosis not present

## 2013-02-03 DIAGNOSIS — M6281 Muscle weakness (generalized): Secondary | ICD-10-CM | POA: Diagnosis not present

## 2013-02-03 DIAGNOSIS — M25519 Pain in unspecified shoulder: Secondary | ICD-10-CM | POA: Diagnosis not present

## 2013-02-05 DIAGNOSIS — M25659 Stiffness of unspecified hip, not elsewhere classified: Secondary | ICD-10-CM | POA: Diagnosis not present

## 2013-02-05 DIAGNOSIS — M6281 Muscle weakness (generalized): Secondary | ICD-10-CM | POA: Diagnosis not present

## 2013-02-05 DIAGNOSIS — Z96649 Presence of unspecified artificial hip joint: Secondary | ICD-10-CM | POA: Diagnosis not present

## 2013-02-05 DIAGNOSIS — M25559 Pain in unspecified hip: Secondary | ICD-10-CM | POA: Diagnosis not present

## 2013-02-06 DIAGNOSIS — M25659 Stiffness of unspecified hip, not elsewhere classified: Secondary | ICD-10-CM | POA: Diagnosis not present

## 2013-02-10 DIAGNOSIS — M129 Arthropathy, unspecified: Secondary | ICD-10-CM | POA: Diagnosis not present

## 2013-02-10 DIAGNOSIS — E7800 Pure hypercholesterolemia, unspecified: Secondary | ICD-10-CM | POA: Diagnosis not present

## 2013-02-10 DIAGNOSIS — M549 Dorsalgia, unspecified: Secondary | ICD-10-CM | POA: Diagnosis not present

## 2013-02-10 DIAGNOSIS — Z1330 Encounter for screening examination for mental health and behavioral disorders, unspecified: Secondary | ICD-10-CM | POA: Diagnosis not present

## 2013-02-10 DIAGNOSIS — F329 Major depressive disorder, single episode, unspecified: Secondary | ICD-10-CM | POA: Diagnosis not present

## 2013-02-10 DIAGNOSIS — I10 Essential (primary) hypertension: Secondary | ICD-10-CM | POA: Diagnosis not present

## 2013-02-10 DIAGNOSIS — D649 Anemia, unspecified: Secondary | ICD-10-CM | POA: Diagnosis not present

## 2013-02-12 DIAGNOSIS — I10 Essential (primary) hypertension: Secondary | ICD-10-CM | POA: Diagnosis not present

## 2013-02-12 DIAGNOSIS — E7800 Pure hypercholesterolemia, unspecified: Secondary | ICD-10-CM | POA: Diagnosis not present

## 2013-02-12 DIAGNOSIS — E039 Hypothyroidism, unspecified: Secondary | ICD-10-CM | POA: Diagnosis not present

## 2013-02-25 DIAGNOSIS — H26499 Other secondary cataract, unspecified eye: Secondary | ICD-10-CM | POA: Diagnosis not present

## 2013-03-04 DIAGNOSIS — M5030 Other cervical disc degeneration, unspecified cervical region: Secondary | ICD-10-CM | POA: Diagnosis not present

## 2013-03-04 DIAGNOSIS — M999 Biomechanical lesion, unspecified: Secondary | ICD-10-CM | POA: Diagnosis not present

## 2013-03-04 DIAGNOSIS — M9981 Other biomechanical lesions of cervical region: Secondary | ICD-10-CM | POA: Diagnosis not present

## 2013-03-04 DIAGNOSIS — IMO0002 Reserved for concepts with insufficient information to code with codable children: Secondary | ICD-10-CM | POA: Diagnosis not present

## 2013-03-23 DIAGNOSIS — L578 Other skin changes due to chronic exposure to nonionizing radiation: Secondary | ICD-10-CM | POA: Diagnosis not present

## 2013-03-23 DIAGNOSIS — C4492 Squamous cell carcinoma of skin, unspecified: Secondary | ICD-10-CM

## 2013-03-23 DIAGNOSIS — L259 Unspecified contact dermatitis, unspecified cause: Secondary | ICD-10-CM | POA: Diagnosis not present

## 2013-03-23 DIAGNOSIS — Z85828 Personal history of other malignant neoplasm of skin: Secondary | ICD-10-CM | POA: Diagnosis not present

## 2013-03-23 DIAGNOSIS — L570 Actinic keratosis: Secondary | ICD-10-CM | POA: Diagnosis not present

## 2013-03-23 DIAGNOSIS — L820 Inflamed seborrheic keratosis: Secondary | ICD-10-CM | POA: Diagnosis not present

## 2013-03-23 DIAGNOSIS — D0460 Carcinoma in situ of skin of unspecified upper limb, including shoulder: Secondary | ICD-10-CM | POA: Diagnosis not present

## 2013-03-23 DIAGNOSIS — D485 Neoplasm of uncertain behavior of skin: Secondary | ICD-10-CM | POA: Diagnosis not present

## 2013-03-23 DIAGNOSIS — Z471 Aftercare following joint replacement surgery: Secondary | ICD-10-CM | POA: Diagnosis not present

## 2013-03-23 DIAGNOSIS — Z96649 Presence of unspecified artificial hip joint: Secondary | ICD-10-CM | POA: Diagnosis not present

## 2013-03-23 HISTORY — DX: Squamous cell carcinoma of skin, unspecified: C44.92

## 2013-04-03 IMAGING — CR DG SPINE 1V PORT
1 series · 1 of 1 positions shown · non-contrast
Comparison: No prior cross-sectional images.

CLINICAL DATA: Lumbar decompression

PORTABLE SPINE - 1 VIEW

[lateral]
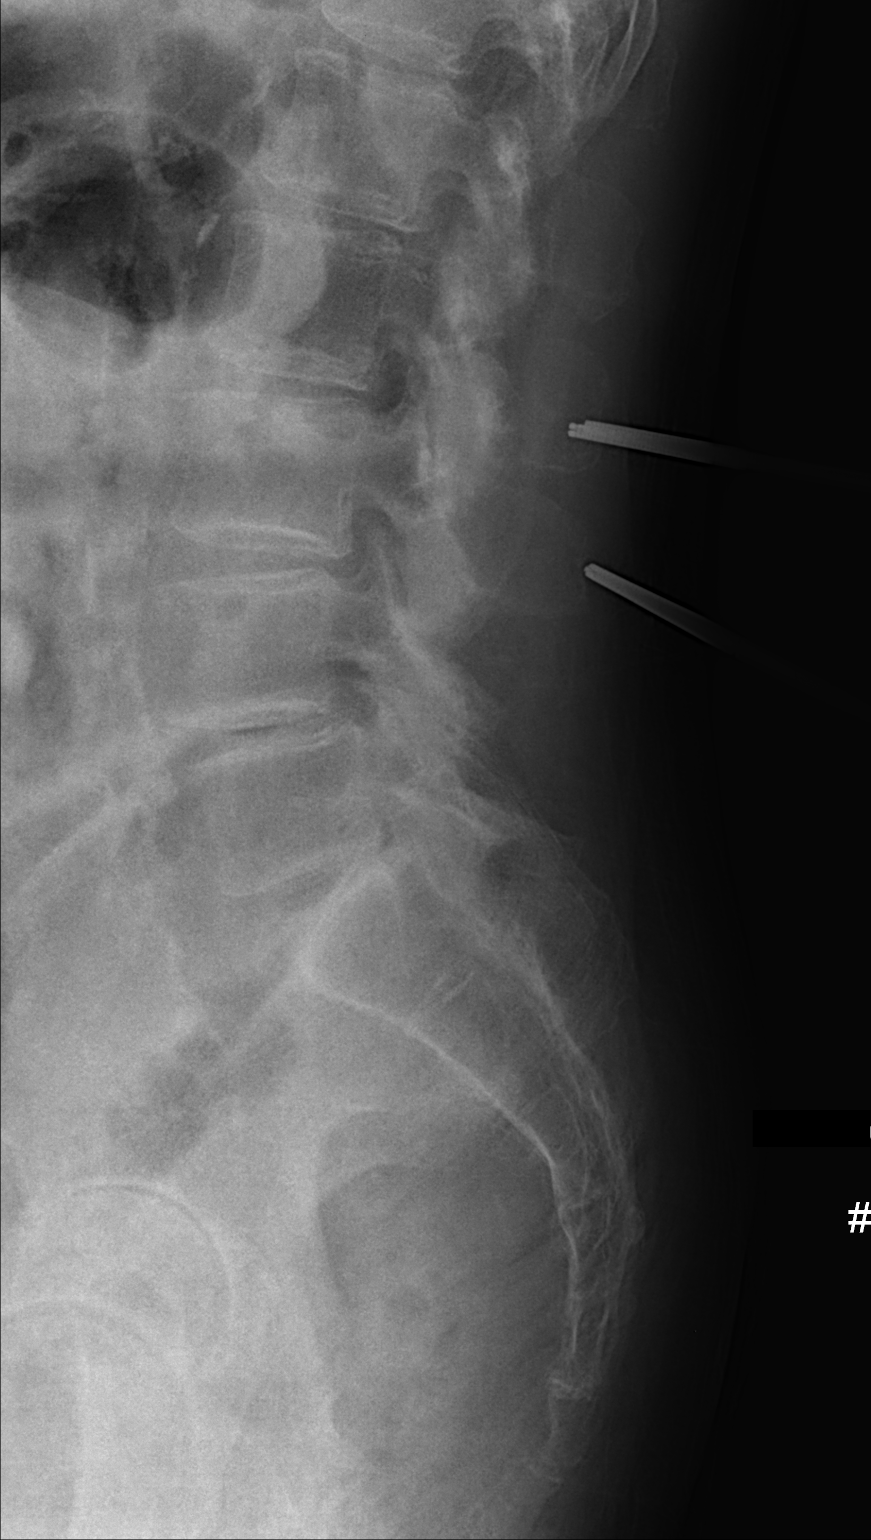

[1 of 1 positions shown; findings below may reference images not displayed]

FINDINGS: Intraoperative cross-table radiograph demonstrates towel
clamps on the spinous processes of L2 and L3.
IMPRESSION: As above.

## 2013-04-14 ENCOUNTER — Ambulatory Visit: Payer: Self-pay | Admitting: Family Medicine

## 2013-04-14 DIAGNOSIS — Z1231 Encounter for screening mammogram for malignant neoplasm of breast: Secondary | ICD-10-CM | POA: Diagnosis not present

## 2013-05-05 DIAGNOSIS — L578 Other skin changes due to chronic exposure to nonionizing radiation: Secondary | ICD-10-CM | POA: Diagnosis not present

## 2013-05-05 DIAGNOSIS — Z85828 Personal history of other malignant neoplasm of skin: Secondary | ICD-10-CM | POA: Diagnosis not present

## 2013-05-05 DIAGNOSIS — D0460 Carcinoma in situ of skin of unspecified upper limb, including shoulder: Secondary | ICD-10-CM | POA: Diagnosis not present

## 2013-05-05 DIAGNOSIS — D048 Carcinoma in situ of skin of other sites: Secondary | ICD-10-CM | POA: Diagnosis not present

## 2013-05-05 DIAGNOSIS — L570 Actinic keratosis: Secondary | ICD-10-CM | POA: Diagnosis not present

## 2013-05-05 DIAGNOSIS — D485 Neoplasm of uncertain behavior of skin: Secondary | ICD-10-CM | POA: Diagnosis not present

## 2013-06-02 DIAGNOSIS — M9981 Other biomechanical lesions of cervical region: Secondary | ICD-10-CM | POA: Diagnosis not present

## 2013-06-02 DIAGNOSIS — M999 Biomechanical lesion, unspecified: Secondary | ICD-10-CM | POA: Diagnosis not present

## 2013-06-02 DIAGNOSIS — M5137 Other intervertebral disc degeneration, lumbosacral region: Secondary | ICD-10-CM | POA: Diagnosis not present

## 2013-06-02 DIAGNOSIS — M5030 Other cervical disc degeneration, unspecified cervical region: Secondary | ICD-10-CM | POA: Diagnosis not present

## 2013-06-16 DIAGNOSIS — M9981 Other biomechanical lesions of cervical region: Secondary | ICD-10-CM | POA: Diagnosis not present

## 2013-06-16 DIAGNOSIS — M999 Biomechanical lesion, unspecified: Secondary | ICD-10-CM | POA: Diagnosis not present

## 2013-06-16 DIAGNOSIS — M5030 Other cervical disc degeneration, unspecified cervical region: Secondary | ICD-10-CM | POA: Diagnosis not present

## 2013-06-16 DIAGNOSIS — IMO0002 Reserved for concepts with insufficient information to code with codable children: Secondary | ICD-10-CM | POA: Diagnosis not present

## 2013-06-17 DIAGNOSIS — IMO0002 Reserved for concepts with insufficient information to code with codable children: Secondary | ICD-10-CM | POA: Diagnosis not present

## 2013-06-17 DIAGNOSIS — M1710 Unilateral primary osteoarthritis, unspecified knee: Secondary | ICD-10-CM | POA: Diagnosis not present

## 2013-06-19 LAB — PATHOLOGY REPORT

## 2013-06-24 DIAGNOSIS — M9981 Other biomechanical lesions of cervical region: Secondary | ICD-10-CM | POA: Diagnosis not present

## 2013-06-24 DIAGNOSIS — M5137 Other intervertebral disc degeneration, lumbosacral region: Secondary | ICD-10-CM | POA: Diagnosis not present

## 2013-06-24 DIAGNOSIS — M5030 Other cervical disc degeneration, unspecified cervical region: Secondary | ICD-10-CM | POA: Diagnosis not present

## 2013-06-24 DIAGNOSIS — IMO0002 Reserved for concepts with insufficient information to code with codable children: Secondary | ICD-10-CM | POA: Diagnosis not present

## 2013-06-24 DIAGNOSIS — M999 Biomechanical lesion, unspecified: Secondary | ICD-10-CM | POA: Diagnosis not present

## 2013-07-07 DIAGNOSIS — M5030 Other cervical disc degeneration, unspecified cervical region: Secondary | ICD-10-CM | POA: Diagnosis not present

## 2013-07-07 DIAGNOSIS — M5137 Other intervertebral disc degeneration, lumbosacral region: Secondary | ICD-10-CM | POA: Diagnosis not present

## 2013-07-07 DIAGNOSIS — M999 Biomechanical lesion, unspecified: Secondary | ICD-10-CM | POA: Diagnosis not present

## 2013-07-07 DIAGNOSIS — IMO0002 Reserved for concepts with insufficient information to code with codable children: Secondary | ICD-10-CM | POA: Diagnosis not present

## 2013-07-07 DIAGNOSIS — M9981 Other biomechanical lesions of cervical region: Secondary | ICD-10-CM | POA: Diagnosis not present

## 2013-07-09 DIAGNOSIS — Z1331 Encounter for screening for depression: Secondary | ICD-10-CM | POA: Diagnosis not present

## 2013-07-09 DIAGNOSIS — Z1330 Encounter for screening examination for mental health and behavioral disorders, unspecified: Secondary | ICD-10-CM | POA: Diagnosis not present

## 2013-07-09 DIAGNOSIS — D649 Anemia, unspecified: Secondary | ICD-10-CM | POA: Diagnosis not present

## 2013-07-09 DIAGNOSIS — Z1342 Encounter for screening for global developmental delays (milestones): Secondary | ICD-10-CM | POA: Diagnosis not present

## 2013-07-09 DIAGNOSIS — M549 Dorsalgia, unspecified: Secondary | ICD-10-CM | POA: Diagnosis not present

## 2013-07-09 DIAGNOSIS — F3289 Other specified depressive episodes: Secondary | ICD-10-CM | POA: Diagnosis not present

## 2013-07-09 DIAGNOSIS — M129 Arthropathy, unspecified: Secondary | ICD-10-CM | POA: Diagnosis not present

## 2013-07-09 DIAGNOSIS — Z0000 Encounter for general adult medical examination without abnormal findings: Secondary | ICD-10-CM | POA: Diagnosis not present

## 2013-07-09 DIAGNOSIS — Z1339 Encounter for screening examination for other mental health and behavioral disorders: Secondary | ICD-10-CM | POA: Diagnosis not present

## 2013-07-09 DIAGNOSIS — F329 Major depressive disorder, single episode, unspecified: Secondary | ICD-10-CM | POA: Diagnosis not present

## 2013-07-09 DIAGNOSIS — E7800 Pure hypercholesterolemia, unspecified: Secondary | ICD-10-CM | POA: Diagnosis not present

## 2013-08-03 DIAGNOSIS — D485 Neoplasm of uncertain behavior of skin: Secondary | ICD-10-CM | POA: Diagnosis not present

## 2013-08-03 DIAGNOSIS — Z85828 Personal history of other malignant neoplasm of skin: Secondary | ICD-10-CM | POA: Diagnosis not present

## 2013-08-03 DIAGNOSIS — L578 Other skin changes due to chronic exposure to nonionizing radiation: Secondary | ICD-10-CM | POA: Diagnosis not present

## 2013-08-03 DIAGNOSIS — L570 Actinic keratosis: Secondary | ICD-10-CM | POA: Diagnosis not present

## 2013-08-03 DIAGNOSIS — L280 Lichen simplex chronicus: Secondary | ICD-10-CM | POA: Diagnosis not present

## 2013-08-24 DIAGNOSIS — F3289 Other specified depressive episodes: Secondary | ICD-10-CM | POA: Diagnosis not present

## 2013-08-24 DIAGNOSIS — I10 Essential (primary) hypertension: Secondary | ICD-10-CM | POA: Diagnosis not present

## 2013-08-24 DIAGNOSIS — G4700 Insomnia, unspecified: Secondary | ICD-10-CM | POA: Diagnosis not present

## 2013-08-24 DIAGNOSIS — E7800 Pure hypercholesterolemia, unspecified: Secondary | ICD-10-CM | POA: Diagnosis not present

## 2013-08-24 DIAGNOSIS — M129 Arthropathy, unspecified: Secondary | ICD-10-CM | POA: Diagnosis not present

## 2013-08-24 DIAGNOSIS — F329 Major depressive disorder, single episode, unspecified: Secondary | ICD-10-CM | POA: Diagnosis not present

## 2013-08-24 DIAGNOSIS — Z1330 Encounter for screening examination for mental health and behavioral disorders, unspecified: Secondary | ICD-10-CM | POA: Diagnosis not present

## 2013-08-24 DIAGNOSIS — D649 Anemia, unspecified: Secondary | ICD-10-CM | POA: Diagnosis not present

## 2013-08-25 DIAGNOSIS — M47816 Spondylosis without myelopathy or radiculopathy, lumbar region: Secondary | ICD-10-CM | POA: Insufficient documentation

## 2013-08-25 DIAGNOSIS — IMO0002 Reserved for concepts with insufficient information to code with codable children: Secondary | ICD-10-CM | POA: Diagnosis not present

## 2013-08-25 DIAGNOSIS — M5116 Intervertebral disc disorders with radiculopathy, lumbar region: Secondary | ICD-10-CM | POA: Insufficient documentation

## 2013-08-25 DIAGNOSIS — M47817 Spondylosis without myelopathy or radiculopathy, lumbosacral region: Secondary | ICD-10-CM | POA: Diagnosis not present

## 2013-08-25 DIAGNOSIS — M48062 Spinal stenosis, lumbar region with neurogenic claudication: Secondary | ICD-10-CM | POA: Diagnosis not present

## 2013-08-25 DIAGNOSIS — M48061 Spinal stenosis, lumbar region without neurogenic claudication: Secondary | ICD-10-CM | POA: Insufficient documentation

## 2013-09-14 DIAGNOSIS — H26499 Other secondary cataract, unspecified eye: Secondary | ICD-10-CM | POA: Diagnosis not present

## 2013-09-14 DIAGNOSIS — H1045 Other chronic allergic conjunctivitis: Secondary | ICD-10-CM | POA: Diagnosis not present

## 2013-09-22 DIAGNOSIS — M48062 Spinal stenosis, lumbar region with neurogenic claudication: Secondary | ICD-10-CM | POA: Diagnosis not present

## 2013-09-22 DIAGNOSIS — IMO0002 Reserved for concepts with insufficient information to code with codable children: Secondary | ICD-10-CM | POA: Diagnosis not present

## 2013-09-22 DIAGNOSIS — M47817 Spondylosis without myelopathy or radiculopathy, lumbosacral region: Secondary | ICD-10-CM | POA: Diagnosis not present

## 2013-10-19 DIAGNOSIS — F3289 Other specified depressive episodes: Secondary | ICD-10-CM | POA: Diagnosis not present

## 2013-10-19 DIAGNOSIS — Z1330 Encounter for screening examination for mental health and behavioral disorders, unspecified: Secondary | ICD-10-CM | POA: Diagnosis not present

## 2013-10-19 DIAGNOSIS — F329 Major depressive disorder, single episode, unspecified: Secondary | ICD-10-CM | POA: Diagnosis not present

## 2013-10-19 DIAGNOSIS — D649 Anemia, unspecified: Secondary | ICD-10-CM | POA: Diagnosis not present

## 2013-10-19 DIAGNOSIS — G4700 Insomnia, unspecified: Secondary | ICD-10-CM | POA: Diagnosis not present

## 2013-10-19 DIAGNOSIS — I10 Essential (primary) hypertension: Secondary | ICD-10-CM | POA: Diagnosis not present

## 2013-10-19 DIAGNOSIS — M129 Arthropathy, unspecified: Secondary | ICD-10-CM | POA: Diagnosis not present

## 2013-10-19 DIAGNOSIS — E7800 Pure hypercholesterolemia, unspecified: Secondary | ICD-10-CM | POA: Diagnosis not present

## 2013-10-19 DIAGNOSIS — Z1342 Encounter for screening for global developmental delays (milestones): Secondary | ICD-10-CM | POA: Diagnosis not present

## 2013-11-12 DIAGNOSIS — Z23 Encounter for immunization: Secondary | ICD-10-CM | POA: Diagnosis not present

## 2013-11-16 DIAGNOSIS — IMO0002 Reserved for concepts with insufficient information to code with codable children: Secondary | ICD-10-CM | POA: Diagnosis not present

## 2013-11-16 DIAGNOSIS — M999 Biomechanical lesion, unspecified: Secondary | ICD-10-CM | POA: Diagnosis not present

## 2013-11-16 DIAGNOSIS — M9981 Other biomechanical lesions of cervical region: Secondary | ICD-10-CM | POA: Diagnosis not present

## 2013-11-16 DIAGNOSIS — M5030 Other cervical disc degeneration, unspecified cervical region: Secondary | ICD-10-CM | POA: Diagnosis not present

## 2013-11-19 DIAGNOSIS — M1710 Unilateral primary osteoarthritis, unspecified knee: Secondary | ICD-10-CM | POA: Diagnosis not present

## 2013-11-19 DIAGNOSIS — M25569 Pain in unspecified knee: Secondary | ICD-10-CM | POA: Diagnosis not present

## 2013-12-23 DIAGNOSIS — M999 Biomechanical lesion, unspecified: Secondary | ICD-10-CM | POA: Diagnosis not present

## 2013-12-23 DIAGNOSIS — M5030 Other cervical disc degeneration, unspecified cervical region: Secondary | ICD-10-CM | POA: Diagnosis not present

## 2013-12-23 DIAGNOSIS — IMO0002 Reserved for concepts with insufficient information to code with codable children: Secondary | ICD-10-CM | POA: Diagnosis not present

## 2013-12-23 DIAGNOSIS — M9981 Other biomechanical lesions of cervical region: Secondary | ICD-10-CM | POA: Diagnosis not present

## 2014-01-05 DIAGNOSIS — M5416 Radiculopathy, lumbar region: Secondary | ICD-10-CM | POA: Diagnosis not present

## 2014-01-05 DIAGNOSIS — M4806 Spinal stenosis, lumbar region: Secondary | ICD-10-CM | POA: Diagnosis not present

## 2014-01-18 DIAGNOSIS — M1712 Unilateral primary osteoarthritis, left knee: Secondary | ICD-10-CM | POA: Diagnosis not present

## 2014-02-04 DIAGNOSIS — D0361 Melanoma in situ of right upper limb, including shoulder: Secondary | ICD-10-CM | POA: Diagnosis not present

## 2014-02-04 DIAGNOSIS — L820 Inflamed seborrheic keratosis: Secondary | ICD-10-CM | POA: Diagnosis not present

## 2014-02-04 DIAGNOSIS — Z85828 Personal history of other malignant neoplasm of skin: Secondary | ICD-10-CM | POA: Diagnosis not present

## 2014-02-04 DIAGNOSIS — D485 Neoplasm of uncertain behavior of skin: Secondary | ICD-10-CM | POA: Diagnosis not present

## 2014-02-04 DIAGNOSIS — D692 Other nonthrombocytopenic purpura: Secondary | ICD-10-CM | POA: Diagnosis not present

## 2014-02-04 DIAGNOSIS — L578 Other skin changes due to chronic exposure to nonionizing radiation: Secondary | ICD-10-CM | POA: Diagnosis not present

## 2014-02-04 DIAGNOSIS — D0461 Carcinoma in situ of skin of right upper limb, including shoulder: Secondary | ICD-10-CM | POA: Diagnosis not present

## 2014-02-04 DIAGNOSIS — C44729 Squamous cell carcinoma of skin of left lower limb, including hip: Secondary | ICD-10-CM | POA: Diagnosis not present

## 2014-02-04 DIAGNOSIS — L570 Actinic keratosis: Secondary | ICD-10-CM | POA: Diagnosis not present

## 2014-02-16 DIAGNOSIS — J449 Chronic obstructive pulmonary disease, unspecified: Secondary | ICD-10-CM | POA: Diagnosis not present

## 2014-02-16 DIAGNOSIS — R05 Cough: Secondary | ICD-10-CM | POA: Diagnosis not present

## 2014-02-22 DIAGNOSIS — J449 Chronic obstructive pulmonary disease, unspecified: Secondary | ICD-10-CM | POA: Diagnosis not present

## 2014-02-22 DIAGNOSIS — F329 Major depressive disorder, single episode, unspecified: Secondary | ICD-10-CM | POA: Diagnosis not present

## 2014-02-22 DIAGNOSIS — M1990 Unspecified osteoarthritis, unspecified site: Secondary | ICD-10-CM | POA: Diagnosis not present

## 2014-02-22 DIAGNOSIS — R05 Cough: Secondary | ICD-10-CM | POA: Diagnosis not present

## 2014-02-22 DIAGNOSIS — D509 Iron deficiency anemia, unspecified: Secondary | ICD-10-CM | POA: Diagnosis not present

## 2014-02-24 DIAGNOSIS — E780 Pure hypercholesterolemia: Secondary | ICD-10-CM | POA: Diagnosis not present

## 2014-02-24 DIAGNOSIS — J40 Bronchitis, not specified as acute or chronic: Secondary | ICD-10-CM | POA: Diagnosis not present

## 2014-02-24 DIAGNOSIS — E039 Hypothyroidism, unspecified: Secondary | ICD-10-CM | POA: Diagnosis not present

## 2014-02-24 LAB — LIPID PANEL
Cholesterol: 209 mg/dL — AB (ref 0–200)
HDL: 72 mg/dL — AB (ref 35–70)
LDL Cholesterol: 100 mg/dL
LDl/HDL Ratio: 1.4
Triglycerides: 184 mg/dL — AB (ref 40–160)

## 2014-04-19 DIAGNOSIS — Z86010 Personal history of colonic polyps: Secondary | ICD-10-CM | POA: Diagnosis not present

## 2014-04-19 DIAGNOSIS — M1711 Unilateral primary osteoarthritis, right knee: Secondary | ICD-10-CM | POA: Diagnosis not present

## 2014-04-19 DIAGNOSIS — M1712 Unilateral primary osteoarthritis, left knee: Secondary | ICD-10-CM | POA: Diagnosis not present

## 2014-04-22 ENCOUNTER — Ambulatory Visit: Payer: Self-pay | Admitting: Orthopedic Surgery

## 2014-04-22 DIAGNOSIS — M1712 Unilateral primary osteoarthritis, left knee: Secondary | ICD-10-CM | POA: Diagnosis not present

## 2014-04-22 DIAGNOSIS — Z01818 Encounter for other preprocedural examination: Secondary | ICD-10-CM | POA: Diagnosis not present

## 2014-05-03 ENCOUNTER — Ambulatory Visit: Payer: Self-pay | Admitting: Gastroenterology

## 2014-05-03 DIAGNOSIS — K219 Gastro-esophageal reflux disease without esophagitis: Secondary | ICD-10-CM | POA: Diagnosis not present

## 2014-05-03 DIAGNOSIS — J449 Chronic obstructive pulmonary disease, unspecified: Secondary | ICD-10-CM | POA: Diagnosis not present

## 2014-05-03 DIAGNOSIS — D125 Benign neoplasm of sigmoid colon: Secondary | ICD-10-CM | POA: Diagnosis not present

## 2014-05-03 DIAGNOSIS — Z800 Family history of malignant neoplasm of digestive organs: Secondary | ICD-10-CM | POA: Diagnosis not present

## 2014-05-03 DIAGNOSIS — D123 Benign neoplasm of transverse colon: Secondary | ICD-10-CM | POA: Diagnosis not present

## 2014-05-03 DIAGNOSIS — Z1211 Encounter for screening for malignant neoplasm of colon: Secondary | ICD-10-CM | POA: Diagnosis not present

## 2014-05-03 DIAGNOSIS — D120 Benign neoplasm of cecum: Secondary | ICD-10-CM | POA: Diagnosis not present

## 2014-05-03 DIAGNOSIS — D122 Benign neoplasm of ascending colon: Secondary | ICD-10-CM | POA: Diagnosis not present

## 2014-05-03 DIAGNOSIS — I10 Essential (primary) hypertension: Secondary | ICD-10-CM | POA: Diagnosis not present

## 2014-05-03 DIAGNOSIS — D124 Benign neoplasm of descending colon: Secondary | ICD-10-CM | POA: Diagnosis not present

## 2014-05-03 DIAGNOSIS — Z86010 Personal history of colonic polyps: Secondary | ICD-10-CM | POA: Diagnosis not present

## 2014-05-03 DIAGNOSIS — Z885 Allergy status to narcotic agent status: Secondary | ICD-10-CM | POA: Diagnosis not present

## 2014-05-03 DIAGNOSIS — E669 Obesity, unspecified: Secondary | ICD-10-CM | POA: Diagnosis not present

## 2014-05-08 DIAGNOSIS — J449 Chronic obstructive pulmonary disease, unspecified: Secondary | ICD-10-CM | POA: Diagnosis not present

## 2014-05-08 DIAGNOSIS — Z800 Family history of malignant neoplasm of digestive organs: Secondary | ICD-10-CM | POA: Diagnosis not present

## 2014-05-08 DIAGNOSIS — K625 Hemorrhage of anus and rectum: Secondary | ICD-10-CM | POA: Diagnosis not present

## 2014-05-08 DIAGNOSIS — Z86010 Personal history of colonic polyps: Secondary | ICD-10-CM | POA: Diagnosis not present

## 2014-05-08 DIAGNOSIS — K91840 Postprocedural hemorrhage and hematoma of a digestive system organ or structure following a digestive system procedure: Secondary | ICD-10-CM | POA: Diagnosis not present

## 2014-05-08 DIAGNOSIS — Z87891 Personal history of nicotine dependence: Secondary | ICD-10-CM | POA: Diagnosis not present

## 2014-05-08 DIAGNOSIS — E785 Hyperlipidemia, unspecified: Secondary | ICD-10-CM | POA: Diagnosis not present

## 2014-05-08 DIAGNOSIS — I10 Essential (primary) hypertension: Secondary | ICD-10-CM | POA: Diagnosis not present

## 2014-05-08 DIAGNOSIS — K922 Gastrointestinal hemorrhage, unspecified: Secondary | ICD-10-CM | POA: Diagnosis not present

## 2014-05-08 DIAGNOSIS — K219 Gastro-esophageal reflux disease without esophagitis: Secondary | ICD-10-CM | POA: Diagnosis not present

## 2014-05-08 DIAGNOSIS — R000 Tachycardia, unspecified: Secondary | ICD-10-CM | POA: Diagnosis not present

## 2014-05-08 DIAGNOSIS — E039 Hypothyroidism, unspecified: Secondary | ICD-10-CM | POA: Diagnosis not present

## 2014-05-08 DIAGNOSIS — Z8711 Personal history of peptic ulcer disease: Secondary | ICD-10-CM | POA: Diagnosis not present

## 2014-05-09 ENCOUNTER — Observation Stay: Payer: Self-pay | Admitting: Internal Medicine

## 2014-05-09 DIAGNOSIS — J449 Chronic obstructive pulmonary disease, unspecified: Secondary | ICD-10-CM | POA: Diagnosis not present

## 2014-05-09 DIAGNOSIS — E039 Hypothyroidism, unspecified: Secondary | ICD-10-CM | POA: Diagnosis not present

## 2014-05-09 DIAGNOSIS — K625 Hemorrhage of anus and rectum: Secondary | ICD-10-CM | POA: Diagnosis not present

## 2014-05-09 DIAGNOSIS — I10 Essential (primary) hypertension: Secondary | ICD-10-CM | POA: Diagnosis not present

## 2014-05-09 DIAGNOSIS — Z87891 Personal history of nicotine dependence: Secondary | ICD-10-CM | POA: Diagnosis not present

## 2014-05-09 DIAGNOSIS — E785 Hyperlipidemia, unspecified: Secondary | ICD-10-CM | POA: Diagnosis not present

## 2014-05-09 DIAGNOSIS — K91840 Postprocedural hemorrhage and hematoma of a digestive system organ or structure following a digestive system procedure: Secondary | ICD-10-CM | POA: Diagnosis not present

## 2014-05-10 DIAGNOSIS — E785 Hyperlipidemia, unspecified: Secondary | ICD-10-CM | POA: Diagnosis not present

## 2014-05-10 DIAGNOSIS — K625 Hemorrhage of anus and rectum: Secondary | ICD-10-CM | POA: Diagnosis not present

## 2014-05-10 DIAGNOSIS — I10 Essential (primary) hypertension: Secondary | ICD-10-CM | POA: Diagnosis not present

## 2014-05-10 DIAGNOSIS — R000 Tachycardia, unspecified: Secondary | ICD-10-CM | POA: Diagnosis not present

## 2014-05-10 DIAGNOSIS — E039 Hypothyroidism, unspecified: Secondary | ICD-10-CM | POA: Diagnosis not present

## 2014-05-11 DIAGNOSIS — E039 Hypothyroidism, unspecified: Secondary | ICD-10-CM | POA: Diagnosis not present

## 2014-05-11 DIAGNOSIS — I10 Essential (primary) hypertension: Secondary | ICD-10-CM | POA: Diagnosis not present

## 2014-05-11 DIAGNOSIS — K625 Hemorrhage of anus and rectum: Secondary | ICD-10-CM | POA: Diagnosis not present

## 2014-05-11 DIAGNOSIS — E785 Hyperlipidemia, unspecified: Secondary | ICD-10-CM | POA: Diagnosis not present

## 2014-05-25 DIAGNOSIS — D649 Anemia, unspecified: Secondary | ICD-10-CM | POA: Diagnosis not present

## 2014-05-25 DIAGNOSIS — E669 Obesity, unspecified: Secondary | ICD-10-CM | POA: Diagnosis not present

## 2014-05-25 DIAGNOSIS — R64 Cachexia: Secondary | ICD-10-CM | POA: Diagnosis not present

## 2014-05-25 DIAGNOSIS — M1991 Primary osteoarthritis, unspecified site: Secondary | ICD-10-CM | POA: Diagnosis not present

## 2014-05-25 DIAGNOSIS — E039 Hypothyroidism, unspecified: Secondary | ICD-10-CM | POA: Diagnosis not present

## 2014-05-25 DIAGNOSIS — I10 Essential (primary) hypertension: Secondary | ICD-10-CM | POA: Diagnosis not present

## 2014-06-07 DIAGNOSIS — Z86010 Personal history of colonic polyps: Secondary | ICD-10-CM | POA: Diagnosis not present

## 2014-06-07 DIAGNOSIS — K625 Hemorrhage of anus and rectum: Secondary | ICD-10-CM | POA: Diagnosis not present

## 2014-06-09 DIAGNOSIS — M1712 Unilateral primary osteoarthritis, left knee: Secondary | ICD-10-CM | POA: Diagnosis not present

## 2014-06-23 ENCOUNTER — Ambulatory Visit
Admit: 2014-06-23 | Disposition: A | Discharge status: Home / Self Care | Payer: Self-pay | Attending: Orthopedic Surgery | Admitting: Orthopedic Surgery

## 2014-06-23 DIAGNOSIS — Z791 Long term (current) use of non-steroidal anti-inflammatories (NSAID): Secondary | ICD-10-CM | POA: Diagnosis not present

## 2014-06-23 DIAGNOSIS — M179 Osteoarthritis of knee, unspecified: Secondary | ICD-10-CM | POA: Diagnosis not present

## 2014-06-23 DIAGNOSIS — I10 Essential (primary) hypertension: Secondary | ICD-10-CM | POA: Diagnosis not present

## 2014-06-23 DIAGNOSIS — M1712 Unilateral primary osteoarthritis, left knee: Secondary | ICD-10-CM | POA: Diagnosis not present

## 2014-06-23 DIAGNOSIS — D62 Acute posthemorrhagic anemia: Secondary | ICD-10-CM | POA: Diagnosis not present

## 2014-06-23 DIAGNOSIS — Z01812 Encounter for preprocedural laboratory examination: Secondary | ICD-10-CM | POA: Diagnosis not present

## 2014-06-23 DIAGNOSIS — Z79899 Other long term (current) drug therapy: Secondary | ICD-10-CM | POA: Diagnosis not present

## 2014-06-23 DIAGNOSIS — K219 Gastro-esophageal reflux disease without esophagitis: Secondary | ICD-10-CM | POA: Diagnosis not present

## 2014-06-23 LAB — URINALYSIS, COMPLETE
Bacteria: NONE SEEN
Bilirubin,UR: NEGATIVE
Blood: NEGATIVE
Glucose,UR: NEGATIVE mg/dL (ref 0–75)
Hyaline Cast: 2
Ketone: NEGATIVE
Nitrite: NEGATIVE
Ph: 5 (ref 4.5–8.0)
Protein: NEGATIVE
RBC,UR: <1 /HPF (ref 0–5)
Specific Gravity: 1.02 (ref 1.003–1.030)
Squamous Epithelial: 1
WBC UR: 2 /HPF (ref 0–5)

## 2014-06-23 LAB — BASIC METABOLIC PANEL
Anion Gap: 11 (ref 7–16)
BUN: 18 mg/dL
Calcium, Total: 9.7 mg/dL
Chloride: 102 mmol/L
Co2: 26 mmol/L
Creatinine: 0.76 mg/dL
EGFR (African American): >60
EGFR (Non-African Amer.): >60
Glucose: 127 mg/dL — ABNORMAL HIGH
Potassium: 3.9 mmol/L
Sodium: 139 mmol/L

## 2014-06-23 LAB — CBC
HCT: 40.1 % (ref 35.0–47.0)
HGB: 12.7 g/dL (ref 12.0–16.0)
MCH: 28.4 pg (ref 26.0–34.0)
MCHC: 31.6 g/dL — ABNORMAL LOW (ref 32.0–36.0)
MCV: 90 fL (ref 80–100)
Platelet: 285 10*3/uL (ref 150–440)
RBC: 4.47 10*6/uL (ref 3.80–5.20)
RDW: 24.3 % — ABNORMAL HIGH (ref 11.5–14.5)
WBC: 6 10*3/uL (ref 3.6–11.0)

## 2014-06-23 LAB — PROTIME-INR
INR: 1
Prothrombin Time: 13.3 secs

## 2014-06-23 LAB — MRSA PCR SCREENING

## 2014-06-23 LAB — SEDIMENTATION RATE: Erythrocyte Sed Rate: 16 mm/hr (ref 0–30)

## 2014-06-23 LAB — APTT: Activated PTT: 30.7 secs (ref 23.6–35.9)

## 2014-06-25 LAB — URINE CULTURE

## 2014-07-08 ENCOUNTER — Inpatient Hospital Stay
Admit: 2014-07-08 | Disposition: A | Discharge status: Home / Self Care | Payer: Self-pay | Attending: Orthopedic Surgery | Admitting: Orthopedic Surgery

## 2014-07-08 DIAGNOSIS — M6281 Muscle weakness (generalized): Secondary | ICD-10-CM | POA: Diagnosis not present

## 2014-07-08 DIAGNOSIS — E039 Hypothyroidism, unspecified: Secondary | ICD-10-CM | POA: Diagnosis not present

## 2014-07-08 DIAGNOSIS — Z471 Aftercare following joint replacement surgery: Secondary | ICD-10-CM | POA: Diagnosis not present

## 2014-07-08 DIAGNOSIS — J449 Chronic obstructive pulmonary disease, unspecified: Secondary | ICD-10-CM | POA: Diagnosis not present

## 2014-07-08 DIAGNOSIS — D62 Acute posthemorrhagic anemia: Secondary | ICD-10-CM | POA: Diagnosis not present

## 2014-07-08 DIAGNOSIS — J309 Allergic rhinitis, unspecified: Secondary | ICD-10-CM | POA: Diagnosis not present

## 2014-07-08 DIAGNOSIS — D649 Anemia, unspecified: Secondary | ICD-10-CM | POA: Diagnosis not present

## 2014-07-08 DIAGNOSIS — Z87891 Personal history of nicotine dependence: Secondary | ICD-10-CM | POA: Diagnosis not present

## 2014-07-08 DIAGNOSIS — F329 Major depressive disorder, single episode, unspecified: Secondary | ICD-10-CM | POA: Diagnosis not present

## 2014-07-08 DIAGNOSIS — Z885 Allergy status to narcotic agent status: Secondary | ICD-10-CM | POA: Diagnosis not present

## 2014-07-08 DIAGNOSIS — Z791 Long term (current) use of non-steroidal anti-inflammatories (NSAID): Secondary | ICD-10-CM | POA: Diagnosis not present

## 2014-07-08 DIAGNOSIS — Z96652 Presence of left artificial knee joint: Secondary | ICD-10-CM | POA: Diagnosis not present

## 2014-07-08 DIAGNOSIS — K257 Chronic gastric ulcer without hemorrhage or perforation: Secondary | ICD-10-CM | POA: Diagnosis not present

## 2014-07-08 DIAGNOSIS — R2689 Other abnormalities of gait and mobility: Secondary | ICD-10-CM | POA: Diagnosis not present

## 2014-07-08 DIAGNOSIS — K219 Gastro-esophageal reflux disease without esophagitis: Secondary | ICD-10-CM | POA: Diagnosis present

## 2014-07-08 DIAGNOSIS — I10 Essential (primary) hypertension: Secondary | ICD-10-CM | POA: Diagnosis present

## 2014-07-08 DIAGNOSIS — K259 Gastric ulcer, unspecified as acute or chronic, without hemorrhage or perforation: Secondary | ICD-10-CM | POA: Diagnosis present

## 2014-07-08 DIAGNOSIS — E785 Hyperlipidemia, unspecified: Secondary | ICD-10-CM | POA: Diagnosis not present

## 2014-07-08 DIAGNOSIS — M179 Osteoarthritis of knee, unspecified: Secondary | ICD-10-CM | POA: Diagnosis present

## 2014-07-08 DIAGNOSIS — M1712 Unilateral primary osteoarthritis, left knee: Secondary | ICD-10-CM | POA: Diagnosis not present

## 2014-07-09 LAB — BASIC METABOLIC PANEL
Anion Gap: 4 — ABNORMAL LOW (ref 7–16)
BUN: 20 mg/dL
Calcium, Total: 8.6 mg/dL — ABNORMAL LOW
Chloride: 104 mmol/L
Co2: 28 mmol/L
Creatinine: 0.85 mg/dL
EGFR (African American): >60
EGFR (Non-African Amer.): >60
Glucose: 169 mg/dL — ABNORMAL HIGH
Potassium: 4.6 mmol/L
Sodium: 136 mmol/L

## 2014-07-09 LAB — PLATELET COUNT: Platelet: 210 10*3/uL (ref 150–440)

## 2014-07-09 LAB — HEMOGLOBIN: HGB: 11.1 g/dL — ABNORMAL LOW (ref 12.0–16.0)

## 2014-07-11 DIAGNOSIS — M1991 Primary osteoarthritis, unspecified site: Secondary | ICD-10-CM | POA: Diagnosis not present

## 2014-07-11 DIAGNOSIS — E039 Hypothyroidism, unspecified: Secondary | ICD-10-CM | POA: Diagnosis not present

## 2014-07-11 DIAGNOSIS — M6281 Muscle weakness (generalized): Secondary | ICD-10-CM | POA: Diagnosis not present

## 2014-07-11 DIAGNOSIS — J449 Chronic obstructive pulmonary disease, unspecified: Secondary | ICD-10-CM | POA: Diagnosis not present

## 2014-07-11 DIAGNOSIS — G4700 Insomnia, unspecified: Secondary | ICD-10-CM | POA: Diagnosis not present

## 2014-07-11 DIAGNOSIS — Z96652 Presence of left artificial knee joint: Secondary | ICD-10-CM | POA: Diagnosis not present

## 2014-07-11 DIAGNOSIS — J309 Allergic rhinitis, unspecified: Secondary | ICD-10-CM | POA: Diagnosis not present

## 2014-07-11 DIAGNOSIS — Z471 Aftercare following joint replacement surgery: Secondary | ICD-10-CM | POA: Diagnosis not present

## 2014-07-11 DIAGNOSIS — Z87891 Personal history of nicotine dependence: Secondary | ICD-10-CM | POA: Diagnosis not present

## 2014-07-11 DIAGNOSIS — F329 Major depressive disorder, single episode, unspecified: Secondary | ICD-10-CM | POA: Diagnosis not present

## 2014-07-11 DIAGNOSIS — E785 Hyperlipidemia, unspecified: Secondary | ICD-10-CM | POA: Diagnosis not present

## 2014-07-11 DIAGNOSIS — R2689 Other abnormalities of gait and mobility: Secondary | ICD-10-CM | POA: Diagnosis not present

## 2014-07-11 DIAGNOSIS — D649 Anemia, unspecified: Secondary | ICD-10-CM | POA: Diagnosis not present

## 2014-07-11 DIAGNOSIS — I10 Essential (primary) hypertension: Secondary | ICD-10-CM | POA: Diagnosis not present

## 2014-07-11 DIAGNOSIS — K257 Chronic gastric ulcer without hemorrhage or perforation: Secondary | ICD-10-CM | POA: Diagnosis not present

## 2014-07-12 ENCOUNTER — Encounter
Admit: 2014-07-12 | Disposition: A | Discharge status: Home / Self Care | Payer: Self-pay | Attending: Internal Medicine | Admitting: Internal Medicine

## 2014-07-13 DIAGNOSIS — J449 Chronic obstructive pulmonary disease, unspecified: Secondary | ICD-10-CM | POA: Diagnosis not present

## 2014-07-13 DIAGNOSIS — I10 Essential (primary) hypertension: Secondary | ICD-10-CM | POA: Diagnosis not present

## 2014-07-13 DIAGNOSIS — M1991 Primary osteoarthritis, unspecified site: Secondary | ICD-10-CM | POA: Diagnosis not present

## 2014-07-16 NOTE — Discharge Summary (Signed)
PATIENT NAME:  Kathryn [name], COCKE MR#:  [id number] DATE OF BIRTH:  Oct 21, 1933  DATE OF ADMISSION:  12/25/2012 DATE OF DISCHARGE:  12/29/2012  ADMITTING DIAGNOSIS: Degenerative joint disease to the right hip.   DISCHARGE DIAGNOSIS: Degenerative joint disease to the right hip.   OPERATION: Right total hip arthroplasty.   SURGEON: Dr. Hessie Knows.   ANESTHESIA: Spinal.   ESTIMATED BLOOD LOSS: 100 mL.   DRAINS: Hemovac.   COMPLICATIONS: None.   IMPLANTS USED: Medacta Amis 4 stem, 54 mm cup DM Versafit cup with liner, M-28 mm head.  HISTORY: Ms. Kapusta is a 81 year old female who failed conservative measures for treatment of her degenerative joint disease of right hip. She consented for an elective right total hip arthroplasty.   PHYSICAL EXAMINATION: GENERAL: Well-developed, well-nourished female in no apparent distress. Normal affect. Slow gait with antalgic component to right lower extremity. Ambulates with a walker.  LUNGS: Clear.  HEART: Regular rate and rhythm.  HEENT: Head normocephalic, atraumatic. Pupils equal, round and reactive to light and accommodation.  RIGHT HIP: Zero degrees internal rotation on the right, 10 degrees external rotation. Severe groin and lateral hip pain with hip range of motion. Flexion contracture of the right hip. Neurovascularly intact. 2+ DP pulse in bilateral lower extremities.   HOSPITAL COURSE: After initial admission on 12/25/2012, the patient had surgery that same day. She had good pain control afterwards, brought to the orthopedic floor from the PACU. Her initial hemoglobin was 11.4. Platelets 222. Physical therapy was begun on that day and she had slow progress. On postoperative day two, 12/26/2012, she continued to have slow progress with physical therapy. On postoperative day three, 12/27/2012, she continued to have slow progress with physical therapy. On postoperative day four, 12/29/2012, the patient was stable and ready for discharge to rehab.    CONDITION AT DISCHARGE: Stable.   DISPOSITION: The patient was sent to rehab.   DISCHARGE INSTRUCTIONS: The patient will follow up with [facility] orthopedics in 2 weeks for staple removal. She will do physical therapy and may weight bear as tolerated in the right lower extremity. She will use TED hose knee-high bilaterally. Dressing can be changed once daily on as needed basis. Please see discharge instructions for a complete list of medications.   DISCHARGE MEDICATIONS: Please see discharge instructions for complete list of discharge medications. ____________________________ Tate Jerkins M. Tretha Sciara, NP amb:sb D: 12/29/2012 08:14:22 ET T: 12/29/2012 08:21:04 ET JOB#: [id number]  cc: Karrin Eisenmenger M. Tretha Sciara, NP, <Dictator> Kem Kays Caliyah Sieh FNP ELECTRONICALLY SIGNED 12/30/2012 8:57

## 2014-07-16 NOTE — Discharge Summary (Signed)
PATIENT NAME:  Kathryn [name], Kathryn [name] MR#:  [id number] DATE OF BIRTH:  [birth year]  DATE OF ADMISSION:  09/11/2012 DATE OF DISCHARGE:   09/15/2012  ADMITTING DIAGNOSIS:  Left hip degenerative arthritis.   OPERATION:  On 09/11/2012, she had a left total hip arthroplasty.   ANESTHESIA:  Spinal.   ESTIMATED BLOOD LOSS:  200 mL.   DRAINS:  Hemovac.   IMPLANTS:  Medacta 54 Versafit DM, liner and 28 mm head, 4 AMIS collared stem.   COMPLICATIONS: None.   HISTORY: Kathryn [name] is a 81 year old white female who failed conservative measures in treatment for her left hip degenerative arthritis. She consented for elective total hip arthroplasty.   PHYSICAL EXAMINATION:  GENERAL:  An elderly female in a wheelchair with no gait noted. The patient was able to stand with difficulty and her hips in are in a flexed position with antalgic component on the left.  HEART:  Reveals regular rate and rhythm. There is no murmur. There is normal apical pulse. LUNGS:  Clear to auscultation. There are no wheezes, rhonchi or crackles. There is normal expansion of bilateral chest walls.  EXTREMITIES:  Both legs reveal very restricted range of motion of both hips with 90 degrees of flexion on the right, and about 75 degrees of flexion on the left. Internal rotation of the right hip is about 15 degrees and 0 degrees on the left with external rotation of about 30 degrees on the right and 15 degrees on the left. Abduction is 15 degrees on the right and 0 degrees on the left. Axial load test is positive on the left hip. She has crepitus with motion of both hips with rotation. She has negative straight leg raise bilaterally. Mild atrophy of the left quadriceps as compared to the right. She is neurovascularly intact.   HOSPITAL COURSE: After initial admission on 09/11/2012, she underwent a total hip arthroplasty the same day. On postoperative day 1, 09/12/2012, she had a hemoglobin of 9.7 and 90 mL of output from her Hemovac. She had  physical therapy that day, but they made very small progress. On postoperative day 2,  09/13/2012, she continued to have slow progress with physical therapy. On postoperative day 3, 09/14/2012, she continued to make slow progress with physical therapy. She was amenable to rehab the next day. T-max that day of 99.6. She was complaining of clicking in her right hip. She had normal x-rays of her hip. On postoperative day 4, 09/15/2012, she was stable and ready for discharge. She has not met physical therapy goals and will need to go to rehab for further of rehabilitation.   DISCHARGE INSTRUCTIONS:  The patient will follow up with [facility] orthopedics in 2 weeks for staple removal. She will do physical therapy and weight bear as tolerated on the left lower extremity. Her diet is regular. TED hose knee-high bilaterally.  Dressing changed once daily and on an as-needed basis.   DISCHARGE MEDICATIONS: 1.  Lisinopril 10 mg 1 tab p.o. at bedtime.  2.  Amitriptyline 100 mg 1 tab p.o. at bedtime.  3.  Levothyroxine 100 mcg 1 tab p.o. q.a.m.  4.  Omeprazole 20 mg 1 cap p.o. q.a.m.  5.  Cetirizine 10 mg 1 tab p.o. at bedtime.  6.  Acetaminophen 500 mg 2 tabs p.o. q.4 hours p.r.n. pain or fever.  7.  Amlodipine 5 mg 1 tab p.o. q.a.m.  8.  Tramadol 50 mg 1 tab p.o. q.6 hours p.r.n. pain.  9.  Atorvastatin 40 mg  1 tab p.o. q.a.m.  10.  Albuterol/ipratropium 2.5 mg/0.5 mg inhalation solution, 3 mL inhaled 4 times daily p.r.n. shortness of breath.  11.  Oxycodone 1 tab p.o. q.4 hours p.r.n. pain.  12.  Magnesium hydroxide 8%, 30 mL p.o. b.i.d. p.r.n. constipation.  13.  Aluminum hydroxide/magnesium hydroxide/simethicone 400/400/40/5 mL, 30 mL p.o. q.6 hours p.r.n. indigestion or heartburn.  14.  Rivaroxaban 10 mg 1 tab p.o. q.a.m. x 9 days.  15.  Zolpidem 5 mg 1 tab p.o. at bedtime p.r.n. insomnia.  16.  Bisacodyl 10 mg rectal once daily p.r.n. constipation.  17.  Docusate/senna 50/8.6 mg 1 tab p.o. b.i.d.     ____________________________ Lai Hendriks M. Tretha Sciara, NP amb:dmm D: 09/15/2012 08:03:02 ET T: 09/15/2012 09:30:20 ET JOB#: [id number]  cc: Savaughn Karwowski M. Tretha Sciara, NP, <Dictator> Kem Kays Cyriah Childrey FNP ELECTRONICALLY SIGNED 09/30/2012 13:07

## 2014-07-16 NOTE — Op Note (Signed)
PATIENT NAME:  Kathryn [name], Kathryn [name] MR#:  [id number] DATE OF BIRTH:  1933/09/13  DATE OF PROCEDURE:  12/25/2012  PREOPERATIVE DIAGNOSIS: Severe right hip osteoarthritis.   POSTOPERATIVE DIAGNOSIS: Severe right knee osteoarthritis.   PROCEDURE: Right total hip replacement.   ANESTHESIA: Spinal.   SURGEON: Hessie Knows, M.D.   ASSISTANT: Francena Hanly, nurse practitioner.   DESCRIPTION OF PROCEDURE: The patient was brought to the operating room and after adequate anesthesia was obtained, the patient was transferred to the operative table with the right foot in the Medacta attachment with foot traction, the left leg on a well-padded table. The C-arm was brought in and good visualization of the right hip was obtained and preop x-ray taken as a template. The hip was then prepped and draped using the standard protocol.  Timeout procedure and patient identification procedure completed and a direct anterior approach was made to the hip with the incision centered over the greater trochanter and tensor fascia lata muscle.   The incision was carried down through the skin and subcutaneous tissue and the TFL was opened and retracted laterally. The deep fascia incised and the rectus sheath opened, lateral femoral circumflex vessels ligated. The anterior capsule was exposed and the flap created. The neck was then exposed and a femoral neck cut carried out with the head removed. There was severe degenerative changes with sclerotic bone on the head and acetabulum.   The acetabulum was prepared with reaming to 54 mm and a 54 mm trial fit very well and this was impacted into place under fluoroscopic control. Next, the leg was externally rotated. The  ischiofemoral and pubofemoral ligaments released. The leg dropped into extension and adduction. A box osteotome was used to open the proximal canal, and sequential rasping was made to a size 4. With a size 4, trials were placed and the final size 4 trial was then impacted.  Trialing again off this, the medium head with a 54 mm Versafit Cup DM liner was assembled and the hip reduced after making sure the acetabulum was free of debris.   The leg length appeared appropriate on our final x-ray. The wound was thoroughly irrigated. Local anesthetic, 0.25% Sensorcaine with epinephrine, was infiltrated in the periarticular tissue. The wound closed with a running heavy Quill suture with subcutaneous drain, 2-0 Quill subcutaneously and skin staples. Xeroform, 4 x 4's, ABD, and tape applied. The patient was sent to the recovery room in stable condition.   ESTIMATED BLOOD LOSS: 100 mL.   COMPLICATIONS: None.   SPECIMEN: None.   IMPLANTS: Medacta AMIS size 4 stem with a 105 Versafit Cup DM acetabular component with an M28 mm head and 54 liner.  SPECIMEN: Removed femoral head.    ____________________________ Laurene Footman, MD mjm:np D: 12/25/2012 17:50:18 ET T: 12/25/2012 20:23:32 ET JOB#: [id number]  cc: Laurene Footman, MD, <Dictator> Laurene Footman MD ELECTRONICALLY SIGNED 12/25/2012 22:38

## 2014-07-16 NOTE — Op Note (Signed)
PATIENT NAME:  Kathryn [name], Kathryn [name] MR#:  [id number] DATE OF BIRTH:  12-Feb-1934  DATE OF PROCEDURE:  09/11/2012  PREOPERATIVE DIAGNOSIS: Severe left hip osteoarthritis.   POSTOPERATIVE DIAGNOSIS: Severe left hip osteoarthritis.   PROCEDURE: Left total hip replacement.   ANESTHESIA: Spinal.   SURGEON: Laurene Footman, MD   ASSISTANT: April Berndt, Nurse Practitioner   DESCRIPTION OF PROCEDURE: The patient was brought to the operating room, and after adequate anesthesia was obtained, the patient was transferred to the operative table with the right leg in the well leg holder, left leg in the Medacta attachment with slight traction applied.  Preoperative x-ray was taken at this time that showed significant deformity to the head.  With traction view, this was felt to be her more anatomic alignment and goal for the surgery. The hip was then prepped and draped in the usual sterile manner with a timeout procedure being carried out. An anterior approach was made to the hip with the incision centered over the greater trochanter and tensor fascia lata muscle.  The tensor fascia was incised and the muscle retracted laterally. The circumflex femoral vessels were ligated and the rectus retracted medially. The capsule was then opened with a flap and the femoral neck cut carried out. The femoral head was removed, and there was eburnated bone present with significant deformity to the head with a flattening of the superior aspect. The labrum was excised and sequential reaming carried out trying to get the hip in a more anatomic position. This was successful with a 54 mm reaming and trial fit very well and felt secure. The 54 Versafit cup DM was then impacted into place, 54 mm in diameter, and attention was then turned to the femur. With external rotation, capsular release was carried out with the pubofemoral and ischiofemoral ligaments.  Dropping the leg down into extension and adduction, the canal was approached with a  box osteotome and curette, a starter rasp and then sequential broaching.  The #4 stem gave a very tight fit and appeared stable rotationally. Trials were made off of the broach and appeared to give appropriate leg length, comparing to initial traction view. The 4 stem was impacted into place with the appropriate dual mobility head, and the hip was reduced. Intraoperative x-ray showed very good position of components, and again it appeared that anatomy had been restored.  The wound was thoroughly irrigated and the anterior capsule repaired with Ethibond, heavy quill for the deep fascia with a subcutaneous drain, subcutaneous quill suture followed by staples,  Xeroform, 4 x 4's, ABD and tape applied. The patient was then sent to the recovery room in stable condition.   ESTIMATED BLOOD LOSS: 200 mL.   COMPLICATIONS: None.   SPECIMENS: Femoral head.   IMPLANTS: Medacta AMIS size 4 standard stem with a 54 mm Versafit cup DM with corresponding liner, a size M femoral head, 28 mm diameter.   ____________________________ Laurene Footman, MD mjm:cb D: 09/11/2012 17:40:41 ET T: 09/11/2012 21:00:50 ET JOB#: [id number]  cc: Laurene Footman, MD, <Dictator> Laurene Footman MD ELECTRONICALLY SIGNED 09/12/2012 6:34

## 2014-07-19 LAB — SURGICAL PATHOLOGY

## 2014-07-25 IMAGING — CR DG HIP 1V*L*
1 series · 1 of 1 positions shown · non-contrast
Comparison: none

REASON FOR EXAM: increased pain
COMMENTS:

PROCEDURE:     DXR - DXR HIP LEFT ONE VIEW  - September 13, 2012  [DATE]
RESULT:

[x hip lat left]
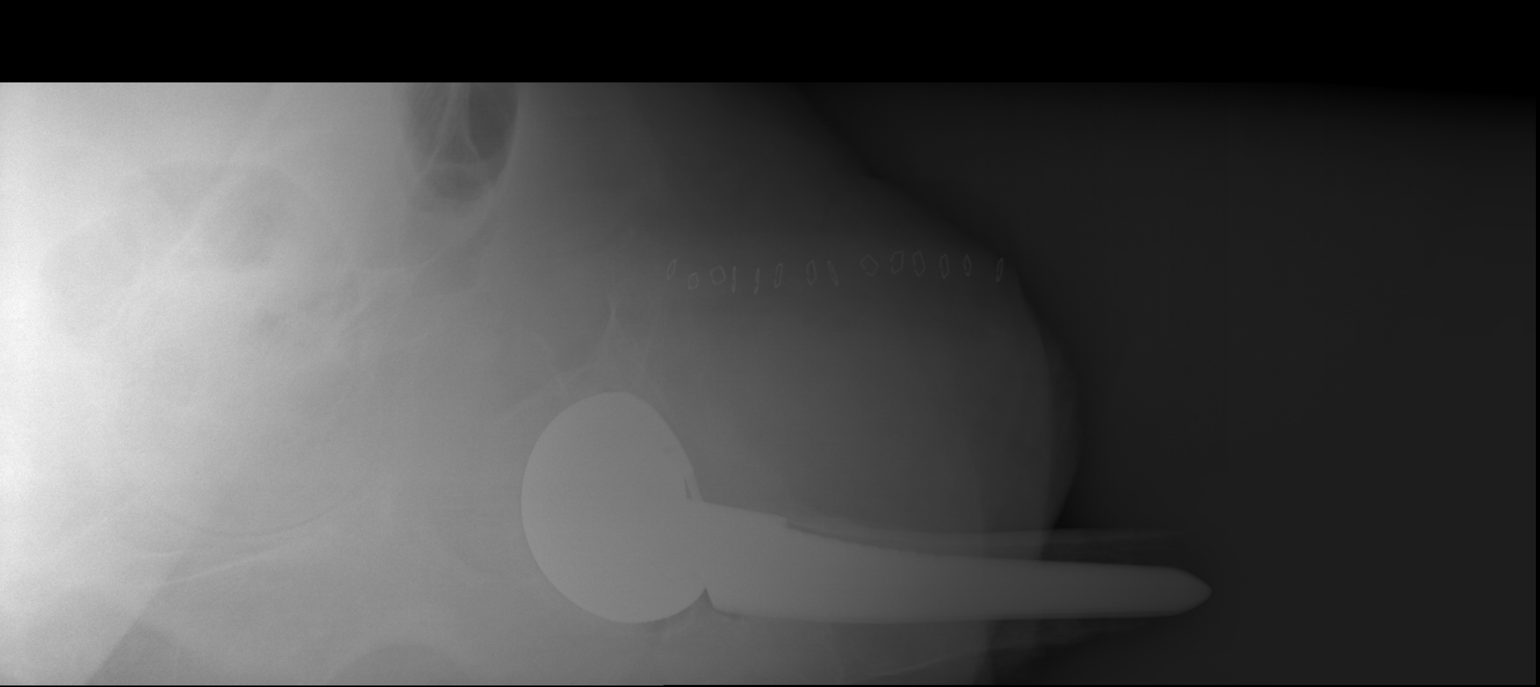

[1 of 1 positions shown; findings below may reference images not displayed]

FINDINGS: Crosstable lateral view of the left hip demonstrates a left hip
prosthesis. Hardware appears intact. Skin staples are identified about the
left hip.
IMPRESSION: 1. The patient is status post total left hip replacement. The remainder of
the interpretation will be left to the performing physician.

## 2014-07-25 NOTE — Discharge Summary (Signed)
PATIENT NAME:  Kathryn [name], PIECZYNSKI MR#:  [id number] DATE OF BIRTH:  04/08/1933  DATE OF ADMISSION:  07/08/2014 DATE OF DISCHARGE:  07/11/2014  ADMITTING DIAGNOSIS: Left knee osteoarthritis.   DISCHARGE DIAGNOSIS: Left knee osteoarthritis.   PROCEDURE: Left total knee replacement.   ANESTHESIA: Spinal.  SURGEON: Laurene Footman, M.D.    ASSISTANT: Ronney Asters, PA-C.   ESTIMATED BLOOD LOSS: 75 mL.   TOURNIQUET TIME: 71 minutes at 300 mmHg.   SPECIMEN: Cut ends of bone.   IMPLANTS: Medacta GMK sphere system with a 3+ femur, 3 tibia with a short stem, 3 patella, and a 12 mm Flex insert. All components cemented.   COMPLICATIONS: None.   CONDITION: To recovery room stable.   HISTORY OF PRESENT ILLNESS: The patient is a 81 year old female, who is here for evaluation of left total knee arthroplasty. Her condition has worsened with activities of daily living and the pain has been severe. She has reviewed joint replacement educational information. All conservative measures have failed including medications, physical therapy, injection therapy, bracing. my knee CT has been reviewed.   PHYSICAL EXAMINATION: LUNGS: Inspiratory wheeze.  HEART: Regular rate and rhythm.  HEENT: Normal.  LEFT LOWER EXTREMITY: Examination of left lower extremity shows the patient is nontender to palpation. She has mild swelling with no effusion. There is no Baker cyst noted. She has normal range of motion of the hip and ankle. Range of motion of the left knee is limited secondary to pain. Range of motion of the left knee is -10 to 110 degrees. She has a varus deformity of 3 degrees. She has 5/5 strength with abduction, quads, and dorsiflexion. She is neurovascularly intact to the left lower extremity.   HOSPITAL COURSE: The patient was admitted to the hospital on 07/08/2014. She had surgery that same day and was brought to the orthopedic floor from the PACU in stable condition.   On postoperative day 1, the  patient had acute postoperative blood loss anemia with a hemoglobin of 11.1. The remainder of the laboratories and vital signs were stable. On postoperative day 2, vital signs remained stable. She was able to have a bowel movement. On postoperative day 3, 2016, the patient was doing well and stable and ready for discharge to a rehabilitation facility.   CONDITION AT DISCHARGE: Stable.   DISCHARGE INSTRUCTIONS: The patient may gradually increase weight-bearing on the affected extremity. The patient may resume a regular diet as tolerated. Do not get the dressing or bandage wet or dirty. Call [facility] orthopedics for a follow-up appointment in 2 weeks. Change dressing as needed. The patient will continue with occupational and physical therapy at the rehabilitation facility.   DISCHARGE MEDICATIONS: Please see the list of discharge medications on discharge instructions.    ____________________________ T. Rachelle Hora, PA-C tcg:JT D: 07/11/2014 08:03:06 ET T: 07/11/2014 08:54:00 ET JOB#: [id number]  cc: T. Rachelle Hora, PA-C, <Dictator> Duanne Guess [place] ELECTRONICALLY SIGNED 07/22/2014 14:18

## 2014-07-25 NOTE — Consult Note (Signed)
Pt in for post polypectomy bleed and no further bleeding so she can go home today on a full liquid/mechanical soft diet.  Path report shows no signs of cancer. Follow up with Dr. Candace Cruise as needed.  Electronic Signatures: Manya Silvas (MD)  (Signed on 16-Feb-16 10:48)  Authored  Last Updated: 16-Feb-16 10:48 by Manya Silvas (MD)

## 2014-07-25 NOTE — H&P (Signed)
PATIENT NAME:  Kathryn [name], DOSCH MR#:  [id number] DATE OF BIRTH:  1933-06-07  DATE OF ADMISSION:  05/08/2014  REFERRING PHYSICIAN: Yetta Numbers. Karma Greaser, MD  PRIMARY CARE PHYSICIAN: Richard L. Rosanna Randy, MD   GASTROENTEROLOGY: Lupita Dawn. Oh, MD   CHIEF COMPLAINT: Bright red blood per rectum.    HISTORY OF PRESENT ILLNESS: This is a 81 year old Caucasian female with past medical history of gastroesophageal reflux disease without esophagitis; hypertension, essential; hyperlipidemia, unspecified; COPD, non-oxygen dependent, presenting with bright red blood per rectum. She had a colonoscopy performed about 5 days ago by Dr. Candace Cruise with multiple polyp removal without complications. She remained in her normal state of health until the day of admission where she had a bowel movement, which she noted as being bright red blood only, no stool formation. This was followed by 3 further episodes. Had associated abdominal cramping, suprapubic in location, intensity 2-3/10, no worsening or relieving factors. No further symptomatology.    REVIEW OF SYSTEMS:  CONSTITUTIONAL: Denies fevers, chills, fatigue, weakness.  EYES: Denies blurred vision, double vision, or eye pain.  EARS, NOSE, THROAT: Denies tinnitus, ear pain, hearing loss.  RESPIRATORY: Denies cough, wheeze, shortness of breath.  CARDIOVASCULAR: Denies chest pain, palpitations, edema.  GASTROINTESTINAL: Positive for right bright blood per rectum as well as abdominal cramping, as stated above. Denies any nausea, vomiting, or diarrhea.  GENITOURINARY: Denies dysuria, hematuria. ENDOCRINE: Denies nocturia or thyroid problems.  HEMATOLOGIC AND LYMPHATIC: Denies easy bruising or bleeding other than stated above.  SKIN: Denies rash or lesion.  MUSCULOSKELETAL: Denies pain in neck, back, shoulder, knees, hips, or arthritic symptoms.  NEUROLOGIC: Denies paralysis or paresthesias.  PSYCHIATRIC: Denies anxiety or depressive symptoms. Otherwise, a full review of systems  performed by me is negative.   PAST MEDICAL HISTORY: Includes gastroesophageal reflux disease without esophagitis; hypertension, essential; hyperlipidemia, unspecified; hypothyroidism, unspecified; COPD, non-oxygen dependent.   SOCIAL HISTORY: Remote tobacco use. Denies any alcohol or drug use.   FAMILY HISTORY: Denies any known cardiovascular or pulmonary disorders.   ALLERGIES: OXYCODONE.   HOME MEDICATIONS: Include acetaminophen 500 mg 2 tabs p.o. q. 6 hours as needed for pain, acetaminophen and hydrocodone 325/5 mg p.o. q. 6 hours as needed for pain, lisinopril 10 mg p.o. daily, duloxetine 30 mg p.o. daily, atorvastatin 40 mg p.o. daily, doxycycline 100 mg p.o. b.i.d., DuoNeb treatments 3 mL twice daily as needed for shortness of breath, Norvasc 5 mg p.o. daily, Feosol 325 mg p.o. b.i.d., bisacodyl 10 mg rectal suppositories daily as needed for constipation, Nexium 40 mg p.o. daily, levothyroxine 100 mcg p.o. daily, vitamin D3 at 2000 international units p.o. daily.   PHYSICAL EXAMINATION:  VITAL SIGNS: Temperature 98.1, heart rate 95, respirations 20, blood pressure 102/59, saturating 98% on supplemental oxygen. Weight 90.7 kg, BMI of 33.3.  GENERAL: A well-nourished, well-developed Caucasian female currently in no acute distress.  HEAD: Normocephalic, atraumatic.  EYES: Pupils equal, round, reactive to light. Extraocular muscles intact. No scleral icterus.  MOUTH: Moist mucosal membrane. Dentition intact. No abscess noted.  EAR, NOSE, AND THROAT: Clear without exudates. No external lesions.  NECK: Supple. No thyromegaly. No nodules. No JVD.  PULMONARY: Clear to auscultation bilaterally without wheezes, rubs, or rhonchi. No use of accessory muscles. Good respiratory effort.  CHEST: Nontender to palpation.  CARDIOVASCULAR: S1, S2, regular rate and rhythm. No murmurs, rubs, or gallops. No edema. Pedal pulses 2+ bilaterally.  GASTROINTESTINAL: Soft, nontender, nondistended. No masses.  Positive bowel sounds. No hepatosplenomegaly.  MUSCULOSKELETAL: No swelling, clubbing, or edema.  Range of motion full in all extremities.  NEUROLOGIC: Cranial nerves II-XII intact. No gross focal neurological deficits. Sensation intact. Reflexes intact.  SKIN: No ulcerations, rashes, or cyanosis. Skin warm, dry. Turgor intact.  PSYCHIATRIC: Mood and affect within normal limits. Patient awake, alert, oriented x 3. Insight and judgment intact.   LABORATORY DATA: Sodium 142, potassium 3.9, chloride 106, bicarbonate 24, BUN 20, creatinine 1.17, glucose 207. LFTs within normal limits. WBC of 8.6, hemoglobin 10.1, platelets of 333,000.   ASSESSMENT AND PLAN: A 81 year old Caucasian female with history of gastroesophageal reflux disease without esophagitis, presenting with bright red blood per rectum. Of note, had a colonoscopy 5 days ago with polypectomy.  1.  Bright red blood per rectum. Trend CBCs q. 6 hours. Transfusion if hemoglobin less than 7. Consult gastroenterology.  2.  Hypertension, essential. Continue with her home medications including ACE inhibitor.  3.  Hyperlipidemia, unspecified. Continue with statin therapy. 4.  Hypothyroidism, unspecified. Continue with Synthroid.  5.  Venous thromboembolism prophylaxis: Sequential compression devices.   CODE STATUS: Patient is a full code.   TIME SPENT: 45 minutes.    ____________________________ Aaron Mose. Jayant Kriz, MD dkh:bm D: 05/09/2014 01:28:00 ET T: 05/09/2014 01:45:21 ET JOB#: [id number]  cc: Aaron Mose. Jadee Golebiewski, MD, <Dictator> Tylor Gambrill Woodfin Ganja MD ELECTRONICALLY SIGNED 05/09/2014 20:45

## 2014-07-25 NOTE — Discharge Summary (Signed)
PATIENT NAME:  Kathryn [name], Kathryn [name] MR#:  [id number] DATE OF BIRTH:  [birth year]  DATE OF ADMISSION:  05/09/2014 DATE OF DISCHARGE:  05/11/2014  ADMISSION DIAGNOSES:  1.  Bright red blood and red blood per rectum.  2.  Hypertension.  3.  Hyperlipidemia.  4.  Hypothyroidism.   DISCHARGE DIAGNOSES:  1.  Bright red blood per rectum secondary to post polypectomy bleed.  2.  Hypertension.  3.  Hyperlipidemia.  4.  Hypothyroidism.   CONSULTATIONS: Gastroenterology.   PROCEDURES: None.   BRIEF HISTORY: The patient is a 81 year old Caucasian female with a history of GERD, had a colonoscopy performed by Dr. Candace Cruise 5 days prior to the admission. On the day of admission, the patient had a bowel movement with bright red blood with no stool. After having 3 more episodes, the patient was having abdominal cramps, which made her come to the hospital. Please review history and physical for details.   HOSPITAL COURSE BASED THE PROBLEM: For bright red blood per rectum, the patient was admitted to the hospitalist service. Hemoglobin and hematocrit were monitored every 6 hours and the plan was to transfuse blood if hemoglobin trend to 7. At the time of admission, her hemoglobin count was at 10.1. Gastroenterology consult was placed. The patient's clinical situation was monitored closely and was evaluated by gastroenterology, Dr. Vira Agar. The bleeding seemed to be post polypectomy bleed. The patient had multiple polyps removed from different areas of the colon 5 days prior to this admission. Her hemoglobin and blood pressure were stable. Hemodynamically, the patient being stable, no procedures were considered. Clear liquid diet was provided during the hospital course. Hemoglobin being stable at around 9.0, the patient was given full liquid diet, which was tolerated by the well. The patient being stable with no other episodes of bleeding, no interventions were recommended by gastroenterology. Pathology report has revealed no  signs of cancer. Dr. Vira Agar has recommended to discharge the patient with a full liquid and mechanical soft diet and eventually to follow up with Dr. Candace Cruise as an outpatient as needed. Hospital course was uneventful.   DISCHARGE PHYSICAL EXAMINATION:  VITAL SIGNS: On February 16, temperature 97.3, pulse 99, respirations 18, blood pressure 137/74, pulse oximetry 96%.  GENERAL APPEARANCE: Not in acute distress. Moderately built and nourished  female.  HEENT: Head normocephalic, atraumatic. Pupils are equal, react to light and accommodation. No scleral icterus. No conjunctival injection. No sinus tenderness. No postnasal drip. Moist mucous membranes.  NECK: Supple. No JVD. No thyromegaly. No masses.  LUNGS: Clear to auscultation bilaterally. No accessory muscle use. No anterior chest wall tenderness on palpation.  CARDIAC: S1, S2 normal. Regular rate and rhythm. No murmurs.  GASTROINTESTINAL: Soft. Bowel sounds are positive in all 4 quadrants. Nontender, nondistended. No masses or melena.  NEUROLOGIC: Awake, alert and oriented x 3. Cranial nerves II-XII are grossly intact. Motor and sensory are intact. Reflexes are 2+.  EXTREMITIES: No edema. No cyanosis. No clubbing.  SKIN: Warm to touch. Normal turgor. No rashes. No lesions.  MUSCULOSKELETAL: No joint effusion, tenderness, erythema.  PSYCHIATRIC: Normal mood and affect.   LABORATORY AND IMAGING STUDIES: On February 15, glucose 135, BUN 19, the rest of the BMP is normal. On February 16, white blood cell count is 6.3; hemoglobin on February 14  was 9.4, on February 15 was 9.0, and February 16 it was at 9.1 with a hematocrit of 29.7, platelet count was 304,000. EKG on February 15 has revealed normal sinus rhythm at 87 beats per  minute, normal PR interval, no acute ST-T wave changes.   CONDITION AT THE TIME OF DISCHARGE: Stable.   CODE STATUS: She is full code.   MEDICATIONS AT THE TIME OF DISCHARGE: Levothyroxine 100 mcg 1 tablet p.o. once daily in  the a.m., amlodipine 5 mg p.o. once daily in the a.m., atorvastatin 40 mg p.o. at bedtime, vitamin D3 at 2000 international units 1 capsule p.o. once daily, Tylenol 500 mg 2 tablets p.o. every 6 hours as needed, iron sulfate 325 mg p.o. 2 times a day; Nexium 40 mg p.o. once daily in a.m.; DuoNeb inhalation 2 times a day as needed; magnesium hydroxide 30 mL p.o. 2 times a day as needed for constipation; milk of magnesium 30 mL p.o. every 6 hours as needed for indigestion or heartburn; bisacodyl 10 mg suppository rectally once daily as needed for constipation; Tylenol with hydrocodone 325/5 one tablet p.o. every 6 hours as needed for pain; doxycycline monohydrate 100 mg 1 tablet p.o. 2 times a day, which is her home medication; lisinopril 10 mg p.o. once daily at bedtime; duloxetine 30 mg p.o. once daily.   DIET: Low-sodium, diet consistency mechanical soft for 3 days.   ACTIVITY: As tolerated.   FOLLOWUP: With primary care physician in a week. Gastroenterology, Dr. Candace Cruise, in approximately 2 weeks.   The diagnosis and plan of care were discussed in detail with the patient. She verbalized understanding of the plan. All her questions were answered.  TOTAL TIME SPENT ON DISCHARGE: 40 minutes.    ____________________________ Nicholes Mango, MD ag:bm D: 05/18/2014 20:59:20 ET T: 05/19/2014 06:56:06 ET JOB#: [id number]  cc: Nicholes Mango, MD, <Dictator> Manya Silvas, MD Lupita Dawn. Candace Cruise, MD Primary care physician Nicholes Mango MD ELECTRONICALLY SIGNED 05/21/2014 14:50

## 2014-07-25 NOTE — Consult Note (Signed)
PATIENT NAME:  Kathryn [name], Kathryn [name] MR#:  [id number] DATE OF BIRTH:  1933-11-03  DATE OF CONSULTATION:  05/09/2014  CONSULTING PHYSICIAN:  Manya Silvas, MD  HISTORY OF PRESENT ILLNESS: The patient is 81 year old, white female who had a colonoscopy with multiple polypectomies about 5 days ago. The procedure was done without immediate complications; however, last night she began passing bright red blood per rectum with some clots and was brought to the emergency room where she was noted to have significant lower GI bleeding. Hemoglobin was 10.1 at that moment. It is 9.2 at 8:30 this morning, but there has been no further bleeding. I was asked to see her in consultation.   HABITS: Does not drink. Smoked a half pack to 1 pack a day for 50 years. Has not smoked in 10 years.   REVIEW OF SYSTEMS: She denies any chest pains. No irregular skipping heartbeats. She does have some major back trouble. When she came into the hospital, she was having some panting and shortness of breath, partly she thinks due to the stress of the event.   PAST MEDICAL HISTORY: She has a history of peptic ulcer disease IN 2012. She used to take a lot of BCs back then. She has been on Nexium and has not had any problems with ulcer disease.   FAMILY HISTORY: A sister with colon cancer.   ALLERGIES: OXYCODONE.   HOME MEDICATIONS: Acetaminophen with hydrocodone, lisinopril 10 mg daily, duloxetine 30 mg daily, atorvastatin 40 mg daily, doxycycline 100 mg b.i.d., DuoNeb 3 mL aerosol twice daily as needed for shortness of breath, Norvasc 5 mg daily, Feosol 25 mg b.i.d., bisacodyl 10 mg suppository p.r.n., Nexium 40 mg daily, levothyroxine 100 mcg daily, vitamin D3 international units daily.   PHYSICAL EXAMINATION: GENERAL: Elderly, white female examined with family members in the room.   HEENT: Sclerae anicteric. Conjunctivae negative. The head is atraumatic. Trachea is in the midline.   CHEST: Clear.   HEART: No murmurs or  gallops. Normal S1 and S2.   ABDOMEN: Soft, nontender. No palpable hepatosplenomegaly. Bowel sounds are present.   EXTREMITIES: No edema.   SKIN: Warm and dry.   PSYCHIATRIC: Mood and affect are appropriate.   VITAL SIGNS: Temperature 98, pulse 90, respirations 20, blood pressure 112/68. She is on 2 liters of oxygen with O2 saturation 98% at rest.   LABORATORY DATA: Glucose 207, BUN 20, creatinine 1.17, sodium 142, potassium 3.9, chloride 106, CO2 of 24, calcium 8.9, total protein 7, albumin 3.4, total bilirubin 0.3, alkaline phosphatase 85, SGOT 24, SGPT 22. White blood count 8.2, hemoglobin 9.2, hematocrit 30.2, platelet count 303,000. She has O positive blood with a negative antibody screen.   DIAGNOSTIC DATA: Previous colonoscopy showed multiple polyps, adenomatous. No evidence of dysplasia or malignancy. Abdominal film showed unremarkable bowel gas pattern.   ASSESSMENT: Postpolypectomy bleed. She has not had any bleeding for at least 12 hours now. We will observe her. There is a good chance that this has stopped on its own. I explained to the patient that about 80% of the time this occurs and she will be put on a clear liquid diet. No further intervention at this time. If she starts bleeding again with obvious fresh blood and fall in hemoglobin, we will need to do a colonoscopy with attempts to treat any bleeding places encountered. I will follow with you.    ____________________________ Manya Silvas, MD rte:TT D: 05/09/2014 11:02:30 ET T: 05/09/2014 11:24:32 ET JOB#: [id number]  cc: Herbie Baltimore  T. Vira Agar, MD, <Dictator> Yetta Numbers. Karma Greaser, MD Lupita Dawn. Candace Cruise, MD Richard L. Rosanna Randy, MD Manya Silvas MD ELECTRONICALLY SIGNED 05/29/2014 10:47

## 2014-07-25 NOTE — Consult Note (Signed)
Pt with post polypectomy bleed, she had multiple polyps removed from different areas of the colon 5 days ago.  It appears that she has stopped bleeding with BP and hgb stable.  Will follow with you.  Keep her on a clear liquid diet please and check hgb bid today and daily starting tomorrow, call me for evidence of active bleeding as she would then need urgent colonoscopy.  Electronic Signatures: Manya Silvas (MD)  (Signed on 14-Feb-16 11:08)  Authored  Last Updated: 14-Feb-16 11:08 by Manya Silvas (MD)

## 2014-07-25 NOTE — Op Note (Signed)
PATIENT NAME:  Kathryn [name], Kathryn [name] MR#:  [id number] DATE OF BIRTH:  1934-01-16  DATE OF PROCEDURE:  07/08/2014.  PREOPERATIVE DIAGNOSIS:  Left knee osteoarthritis.   POSTOPERATIVE DIAGNOSIS:  Left knee osteoarthritis.   PROCEDURE:  Left total knee replacement.   ANESTHESIA:  Spinal.   SURGEON:  Hessie Knows, MD.   ASSISTANT:  Rachelle Hora, PA-C.   DESCRIPTION OF PROCEDURE:  The patient was brought to the operating room, and after adequate anesthesia was obtained, the left leg was prepped and draped in the usual sterile fashion with a tourniquet applied to the upper thigh.  After patient identification and timeout procedures were completed, the tourniquet was raised to 300 mmHg and a midline skin incision was made followed by a medial parapatellar arthrotomy.  Inspection revealed sclerotic bone with significant bone loss in the medial compartment and extensive osteophytes, ACL intact, lateral compartment having moderate degenerative changes and exposed bone on the patella and patellar trochlea.  The fat pad was excised and the PCL and ACL cut.  The proximal tibia was exposed for application of the [place]a cutting block.  Once this was applied and alignment checked, the proximal tibia cut was carried out and the proximal tibia bone removed.  The femur was approached in a similar fashion with a distal femoral cut performed followed by the anterior, posterior, and chamfer cuts using the 3+ cutting guide.  At this point, the posterior horns of the menisci were excised as well.  The size 3 tibia was placed and pinned in this position followed by the 3+ femur.  A 12 mm trial insert gave good stability throughout a range of motion.  After having previously prepared the proximal tibia for a short stem, the distal femoral drill holes were made and all trials were removed.  The notch cut then created off the anterior femur with the cutting guide.  At this point, the patellar cutting guide was applied and the patella  cut.  It sized to a size 3.  After drill holes were made, the tourniquet was let down at this point and hemostasis achieved with electrocautery.  The knee was infiltrated with a combination of Exparel diluted with saline as well as a combination of morphine, Toradol, and 0.25% Sensorcaine with epinephrine in the peri-articular tissues.  The tourniquet was then raised again.  The bony surfaces thoroughly irrigated and dried. The size 3 tibia with short stem was impacted with cement fixation followed by the 12 mm insert with set screw then the 3+ femur, excess cement being removed and the knee held in extension while the patellar button was clamped into place.  A dilute Betadine solution was then used to irrigate the knee followed by pulsatile lavage after removal of some excessive cement with a small osteotome.  The tourniquet was let down and the arthrotomy repaired using a heavy Quill suture followed by 2-0 Quill subcutaneously and skin staples.  Xeroform, 4 x 4's, ABD, Webril, Polar Care, and Ace wrap applied.  The patient was sent to the recovery room in stable condition.   ESTIMATED BLOOD LOSS: 75 mL.   TOURNIQUET TIME:  71 minutes at 300 mmHg.   SPECIMENS:  Cut ends of bone.   IMPLANTS:  [place] Sphere system with a 3+ femur, 3 tibia with a short stem, 3 patella, and a 12 mm flex insert.  All components cemented.   COMPLICATIONS:  None.   CONDITION:  To recovery room, stable.    ____________________________ Laurene Footman, MD  mjm:kc D: 07/08/2014 18:57:43 ET T: 07/08/2014 19:18:49 ET JOB#: [id number]  cc: Laurene Footman, MD, <Dictator> Laurene Footman MD ELECTRONICALLY SIGNED 07/09/2014 8:19

## 2014-07-25 NOTE — Consult Note (Signed)
Pt  has not had any bleeding today.  hgb 9, plt 306.  Should go home tomorrow or clear liq/full liq diet for 2-3 days.  Dr. Candace Cruise will be back tomorrow and will let him know about the patient./  Electronic Signatures: Manya Silvas (MD)  (Signed on 15-Feb-16 17:40)  Authored  Last Updated: 15-Feb-16 17:40 by Manya Silvas (MD)

## 2014-07-28 DIAGNOSIS — Z96652 Presence of left artificial knee joint: Secondary | ICD-10-CM | POA: Diagnosis not present

## 2014-08-03 DIAGNOSIS — Z96652 Presence of left artificial knee joint: Secondary | ICD-10-CM | POA: Diagnosis not present

## 2014-08-05 DIAGNOSIS — Z96652 Presence of left artificial knee joint: Secondary | ICD-10-CM | POA: Diagnosis not present

## 2014-08-10 DIAGNOSIS — Z96652 Presence of left artificial knee joint: Secondary | ICD-10-CM | POA: Diagnosis not present

## 2014-08-12 DIAGNOSIS — Z96652 Presence of left artificial knee joint: Secondary | ICD-10-CM | POA: Diagnosis not present

## 2014-08-17 DIAGNOSIS — E039 Hypothyroidism, unspecified: Secondary | ICD-10-CM | POA: Diagnosis not present

## 2014-08-17 DIAGNOSIS — M1990 Unspecified osteoarthritis, unspecified site: Secondary | ICD-10-CM | POA: Diagnosis not present

## 2014-08-17 DIAGNOSIS — D649 Anemia, unspecified: Secondary | ICD-10-CM | POA: Diagnosis not present

## 2014-08-17 DIAGNOSIS — Z96652 Presence of left artificial knee joint: Secondary | ICD-10-CM | POA: Diagnosis not present

## 2014-08-17 DIAGNOSIS — I10 Essential (primary) hypertension: Secondary | ICD-10-CM | POA: Diagnosis not present

## 2014-08-17 DIAGNOSIS — J449 Chronic obstructive pulmonary disease, unspecified: Secondary | ICD-10-CM | POA: Diagnosis not present

## 2014-08-17 LAB — HEPATIC FUNCTION PANEL
ALT: 12 U/L (ref 7–35)
AST: 14 U/L (ref 13–35)
Alkaline Phosphatase: 116 U/L (ref 25–125)
Bilirubin, Total: 0.2 mg/dL

## 2014-08-17 LAB — BASIC METABOLIC PANEL
Creatinine: 0.9 mg/dL (ref 0.5–1.1)
Glucose: 131 mg/dL

## 2014-08-17 LAB — CBC AND DIFFERENTIAL
HCT: 40 % (ref 36–46)
Hemoglobin: 12.5 g/dL (ref 12.0–16.0)
Neutrophils Absolute: 5 /uL
Platelets: 292 10*3/uL (ref 150–399)
WBC: 8.2 10^3/mL

## 2014-08-17 LAB — TSH: TSH: 4.44 u[IU]/mL (ref 0.41–5.90)

## 2014-08-19 DIAGNOSIS — Z96652 Presence of left artificial knee joint: Secondary | ICD-10-CM | POA: Diagnosis not present

## 2014-08-20 DIAGNOSIS — Z96652 Presence of left artificial knee joint: Secondary | ICD-10-CM | POA: Diagnosis not present

## 2014-09-14 ENCOUNTER — Telehealth: Payer: Self-pay

## 2014-09-14 NOTE — Telephone Encounter (Signed)
Called pt about insurance fax inquiring about possible sleep apnea. Pt refused sleep study at this time. Renaldo Fiddler, CMA

## 2014-09-17 ENCOUNTER — Encounter: Payer: Self-pay | Admitting: Family Medicine

## 2014-09-17 ENCOUNTER — Other Ambulatory Visit: Payer: Self-pay

## 2014-09-17 ENCOUNTER — Other Ambulatory Visit: Payer: Self-pay | Admitting: Family Medicine

## 2014-09-17 ENCOUNTER — Ambulatory Visit (INDEPENDENT_AMBULATORY_CARE_PROVIDER_SITE_OTHER): Payer: Medicare Other | Admitting: Family Medicine

## 2014-09-17 VITALS — BP 138/76 | HR 94 | Temp 97.8°F | Resp 16 | Ht 65.0 in | Wt 203.0 lb

## 2014-09-17 DIAGNOSIS — G450 Vertebro-basilar artery syndrome: Secondary | ICD-10-CM | POA: Insufficient documentation

## 2014-09-17 DIAGNOSIS — R918 Other nonspecific abnormal finding of lung field: Secondary | ICD-10-CM | POA: Insufficient documentation

## 2014-09-17 DIAGNOSIS — R700 Elevated erythrocyte sedimentation rate: Secondary | ICD-10-CM | POA: Insufficient documentation

## 2014-09-17 DIAGNOSIS — R600 Localized edema: Secondary | ICD-10-CM

## 2014-09-17 DIAGNOSIS — E039 Hypothyroidism, unspecified: Secondary | ICD-10-CM | POA: Insufficient documentation

## 2014-09-17 DIAGNOSIS — M1990 Unspecified osteoarthritis, unspecified site: Secondary | ICD-10-CM | POA: Insufficient documentation

## 2014-09-17 DIAGNOSIS — I10 Essential (primary) hypertension: Secondary | ICD-10-CM | POA: Insufficient documentation

## 2014-09-17 DIAGNOSIS — G4700 Insomnia, unspecified: Secondary | ICD-10-CM | POA: Insufficient documentation

## 2014-09-17 DIAGNOSIS — D509 Iron deficiency anemia, unspecified: Secondary | ICD-10-CM | POA: Insufficient documentation

## 2014-09-17 DIAGNOSIS — D649 Anemia, unspecified: Secondary | ICD-10-CM | POA: Insufficient documentation

## 2014-09-17 DIAGNOSIS — M5116 Intervertebral disc disorders with radiculopathy, lumbar region: Secondary | ICD-10-CM | POA: Insufficient documentation

## 2014-09-17 DIAGNOSIS — Z87891 Personal history of nicotine dependence: Secondary | ICD-10-CM | POA: Insufficient documentation

## 2014-09-17 DIAGNOSIS — F32A Depression, unspecified: Secondary | ICD-10-CM | POA: Insufficient documentation

## 2014-09-17 DIAGNOSIS — F329 Major depressive disorder, single episode, unspecified: Secondary | ICD-10-CM | POA: Insufficient documentation

## 2014-09-17 DIAGNOSIS — E663 Overweight: Secondary | ICD-10-CM | POA: Insufficient documentation

## 2014-09-17 DIAGNOSIS — R7989 Other specified abnormal findings of blood chemistry: Secondary | ICD-10-CM | POA: Insufficient documentation

## 2014-09-17 DIAGNOSIS — E7800 Pure hypercholesterolemia, unspecified: Secondary | ICD-10-CM | POA: Insufficient documentation

## 2014-09-17 DIAGNOSIS — J449 Chronic obstructive pulmonary disease, unspecified: Secondary | ICD-10-CM | POA: Insufficient documentation

## 2014-09-17 HISTORY — DX: Vertebro-basilar artery syndrome: G45.0

## 2014-09-17 NOTE — Patient Instructions (Signed)
We will call you with your lab results 

## 2014-09-17 NOTE — Progress Notes (Signed)
Subjective:     Patient ID: Kathryn [name], female   DOB: February 08, 1934, 81 y.o.   MRN: [id number]  HPI  Chief Complaint  Patient presents with  . Edema    x 2-3 weeks.Bilateral feet, has had knee surgery on left leg, but pt is concerned is because her right foot is also swollen.  States the swelling got worse when she was at [facility] recently. Reports the swelling has improved overnight. Remains on amlodipine.Reports Dr. Rudene Christians has told her she would have swelling on the left for a while after surgery. She is walking without assistive device but states she is not quite at full activity level yet.   Review of Systems  Respiratory: Negative for shortness of breath.        Objective:   Physical Exam  Constitutional: She appears well-developed and well-nourished. No distress.  Cardiovascular:  1+ pedal edema bilaterally. No calf tenderness, erythema or edema.  Pulmonary/Chest: Breath sounds normal.       Assessment:    1. Pedal edema: multifactorial  Renal function panel    Plan:   Stay active with leg elevation as needed. Consider diuretic if labs ok or discuss switching off amlodipine with her primary M.D. , Dr. Rosanna Randy.

## 2014-09-18 LAB — RENAL FUNCTION PANEL
Albumin: 4.1 g/dL (ref 3.5–4.8)
BUN/Creatinine Ratio: 25 (ref 11–26)
BUN: 17 mg/dL (ref 8–27)
CO2: 23 mmol/L (ref 18–29)
Calcium: 9.7 mg/dL (ref 8.7–10.3)
Chloride: 99 mmol/L (ref 97–108)
Creatinine, Ser: 0.68 mg/dL (ref 0.57–1.00)
GFR calc Af Amer: 97 mL/min/{1.73_m2} (ref >59)
GFR calc non Af Amer: 84 mL/min/{1.73_m2} (ref >59)
Glucose: 111 mg/dL — ABNORMAL HIGH (ref 65–99)
Phosphorus: 3.3 mg/dL (ref 2.5–4.5)
Potassium: 5 mmol/L (ref 3.5–5.2)
Sodium: 141 mmol/L (ref 134–144)

## 2014-09-20 NOTE — Telephone Encounter (Signed)
-----   Message from Carmon Ginsberg, [place] sent at 09/20/2014  8:01 AM EDT ----- Kidney function is fine. Do f/u with Dr. Rosanna Randy if leg swelling does not continue to resolve.

## 2014-09-20 NOTE — Telephone Encounter (Signed)
Patient has been advised

## 2014-10-05 DIAGNOSIS — Z961 Presence of intraocular lens: Secondary | ICD-10-CM | POA: Diagnosis not present

## 2014-11-02 ENCOUNTER — Encounter: Payer: Self-pay | Admitting: Family Medicine

## 2014-11-02 ENCOUNTER — Ambulatory Visit (INDEPENDENT_AMBULATORY_CARE_PROVIDER_SITE_OTHER): Payer: Medicare Other | Admitting: Family Medicine

## 2014-11-02 VITALS — BP 140/80 | HR 80 | Temp 98.3°F | Resp 18 | Wt 196.0 lb

## 2014-11-02 DIAGNOSIS — M545 Low back pain, unspecified: Secondary | ICD-10-CM

## 2014-11-02 MED ORDER — METHOCARBAMOL 500 MG PO TABS: 500.0000 mg | ORAL_TABLET | Freq: Four times a day (QID) | ORAL | Status: DC

## 2014-11-02 NOTE — Progress Notes (Signed)
Patient: Kathryn [name] Female    DOB: 23-Oct-1933   81 y.o.   MRN: [id number] Visit Date: 11/02/2014  Today's Provider: Vernie Murders, PA   Chief Complaint  Patient presents with  . Back Pain    Right flank pain   Subjective:    Back Pain This is a new problem. The current episode started yesterday. The problem occurs constantly. The problem is unchanged. Pain location: Lower back right side. The quality of the pain is described as stabbing. Radiates to: It radiates to the right lower abdomen. The pain is severe. The pain is worse during the day. Exacerbated by: is there all the time, is ease when laying down. Stiffness is present all day. Associated symptoms include headaches. Pertinent negatives include no bladder incontinence, chest pain, fever, leg pain, pelvic pain, tingling or weakness. She has tried heat for the symptoms. The treatment provided no relief.   Past Medical History  Diagnosis Date  . Hypertension     PCP Dr Miguel Aschoff   [place]  . Shortness of breath   . Pneumonia   . Hypothyroidism   . Peripheral vascular disease     varicose veins left leg  . Gastric ulcer 4/12    treated with Prolisec- states no problems now  . Neuromuscular disorder     slight numbness right toes- comes and goes  . Skin cancer     multiple from face  . Seasonal allergies   . Arthritis     spinal stenosis  . Hyperlipidemia    Family History  Problem Relation Age of Onset  . Dementia Mother   . Lung cancer Father   . Heart disease Father    Past Surgical History  Procedure Laterality Date  . Back surgery      4/12  Dr Shellia Carwin- for lumbar stenosis  . Breast surgery  1962    lumpectomy with biopsy   left  . Eye surgery      cataract extraction with IOL bilaterally  . Carpal tunnel release      right  . Colonoscopy    . Esophagogastroduodenoscopy    . Right oophorectomy      due to STAPH INFECTION  . Lumbar laminectomy/decompression microdiscectomy   05/24/2011    Procedure: LUMBAR LAMINECTOMY/DECOMPRESSION MICRODISCECTOMY 2 LEVELS;  Surgeon: Magnus Sinning, MD;  Location: [facility];  Service: Orthopedics;  Laterality: N/A;  L2-L3, L3-L4 (x-ray)        Allergies  Allergen Reactions  . Oxycodone Other (See Comments)  . Prednisone     swelling, bruising.   Previous Medications   ACETAMINOPHEN (TYLENOL) 500 MG TABLET    Take 1,000 mg by mouth every 6 (six) hours as needed. Pain    AMLODIPINE (NORVASC) 5 MG TABLET    Take 5 mg by mouth daily before breakfast.   ATORVASTATIN (LIPITOR) 40 MG TABLET    TAKE 1 TABLET BY MOUTH EVERY DAY   CETIRIZINE (ZYRTEC) 10 MG TABLET    Take 10 mg by mouth at bedtime.    DULOXETINE (CYMBALTA) 30 MG CAPSULE    Take by mouth.   ESOMEPRAZOLE (NEXIUM) 40 MG CAPSULE    Take by mouth.   HYDROXYZINE (ATARAX/VISTARIL) 10 MG TABLET    Take by mouth.   LEVOTHYROXINE (SYNTHROID, LEVOTHROID) 100 MCG TABLET    Take 100 mcg by mouth daily before breakfast.   LISINOPRIL (PRINIVIL,ZESTRIL) 10 MG TABLET    Take 10 mg by mouth at  bedtime.   OMEPRAZOLE (PRILOSEC) 20 MG CAPSULE    Take 20 mg by mouth daily.   OXYMETAZOLINE (AFRIN) 0.05 % NASAL SPRAY    Place 1 spray into the nose at bedtime.   TIOTROPIUM (SPIRIVA HANDIHALER) 18 MCG INHALATION CAPSULE    Place into inhaler and inhale.    Review of Systems  Constitutional: Negative for fever.  Eyes: Negative.   Respiratory: Negative.   Cardiovascular: Negative.  Negative for chest pain and palpitations.  Gastrointestinal: Positive for nausea.  Endocrine: Negative.   Genitourinary: Positive for flank pain. Negative for bladder incontinence and pelvic pain.  Musculoskeletal: Positive for back pain.  Skin: Negative.   Allergic/Immunologic: Negative.   Neurological: Positive for headaches. Negative for tingling and weakness.  Hematological: Negative.   Psychiatric/Behavioral: Negative.     History  Substance Use Topics  . Smoking status: Former Smoker -- 0.50  packs/day for 50 years    Types: Cigarettes    Quit date: 05/16/2004  . Smokeless tobacco: Never Used  . Alcohol Use: Yes     Comment: 2-3 glasses wine week   Objective:   BP 140/80 mmHg  Pulse 80  Temp(Src) 98.3 F (36.8 C) (Oral)  Resp 18  Wt 196 lb (88.905 kg)  Physical Exam  Constitutional: She is oriented to person, place, and time. She appears well-developed and well-nourished. No distress.  HENT:  Head: Normocephalic and atraumatic.  Right Ear: Hearing normal.  Left Ear: Hearing normal.  Nose: Nose normal.  Eyes: Conjunctivae and lids are normal. Right eye exhibits no discharge. Left eye exhibits no discharge. No scleral icterus.  Pulmonary/Chest: Effort normal. No respiratory distress.  Musculoskeletal: She exhibits tenderness.  Tender to palpate over the right SI joint. Well healed scars from lumbar surgery and left knee replacement.  Neurological: She is alert and oriented to person, place, and time.  SLR's to 80 degrees bilaterally with discomfort only on the right. DTR's 1+ left knee and 2+ on right.  Skin: Skin is intact. No lesion and no rash noted.  Psychiatric: She has a normal mood and affect. Her speech is normal and behavior is normal. Thought content normal.       Assessment & Plan:     1. Right-sided low back pain without sciatica Sharp pain in right SI joint yesterday after walking quickly in the house for exercise. No immediate discomfort. Pain developed as she went to get a cup of coffee. History of lumbar laminectomy and spinal stenosis surgery by Dr. Shellia Carwin and left knee replacement. Recommend she apply moist heat and given Robaxin for spasm and pain. Urinalysis showed concentration of urine but no crystals or RBC's. Recheck if no better in 5 days. - POCT urinalysis dipstick - methocarbamol (ROBAXIN) 500 MG tablet; Take 1 tablet (500 mg total) by mouth 4 (four) times daily.  Dispense: 30 tablet; Refill: 0     Vernie Murders, PA  [facility]

## 2014-11-02 NOTE — Patient Instructions (Signed)
Back Pain, Adult Low back pain is very common. About 1 in 5 people have back pain.The cause of low back pain is rarely dangerous. The pain often gets better over time.About half of people with a sudden onset of back pain feel better in just 2 weeks. About 8 in 10 people feel better by 6 weeks.  CAUSES Some common causes of back pain include:  Strain of the muscles or ligaments supporting the spine.  Wear and tear (degeneration) of the spinal discs.  Arthritis.  Direct injury to the back. DIAGNOSIS Most of the time, the direct cause of low back pain is not known.However, back pain can be treated effectively even when the exact cause of the pain is unknown.Answering your caregiver's questions about your overall health and symptoms is one of the most accurate ways to make sure the cause of your pain is not dangerous. If your caregiver needs more information, he or she may order lab work or imaging tests (X-rays or MRIs).However, even if imaging tests show changes in your back, this usually does not require surgery. HOME CARE INSTRUCTIONS For many people, back pain returns.Since low back pain is rarely dangerous, it is often a condition that people can learn to manageon their own.   Remain active. It is stressful on the back to sit or stand in one place. Do not sit, drive, or stand in one place for more than 30 minutes at a time. Take short walks on level surfaces as soon as pain allows.Try to increase the length of time you walk each day.  Do not stay in bed.Resting more than 1 or 2 days can delay your recovery.  Do not avoid exercise or work.Your body is made to move.It is not dangerous to be active, even though your back may hurt.Your back will likely heal faster if you return to being active before your pain is gone.  Pay attention to your body when you bend and lift. Many people have less discomfortwhen lifting if they bend their knees, keep the load close to their bodies,and  avoid twisting. Often, the most comfortable positions are those that put less stress on your recovering back.  Find a comfortable position to sleep. Use a firm mattress and lie on your side with your knees slightly bent. If you lie on your back, put a pillow under your knees.  Only take over-the-counter or prescription medicines as directed by your caregiver. Over-the-counter medicines to reduce pain and inflammation are often the most helpful.Your caregiver may prescribe muscle relaxant drugs.These medicines help dull your pain so you can more quickly return to your normal activities and healthy exercise.  Put ice on the injured area.  Put ice in a plastic bag.  Place a towel between your skin and the bag.  Leave the ice on for 15-20 minutes, 03-04 times a day for the first 2 to 3 days. After that, ice and heat may be alternated to reduce pain and spasms.  Ask your caregiver about trying back exercises and gentle massage. This may be of some benefit.  Avoid feeling anxious or stressed.Stress increases muscle tension and can worsen back pain.It is important to recognize when you are anxious or stressed and learn ways to manage it.Exercise is a great option. SEEK MEDICAL CARE IF:  You have pain that is not relieved with rest or medicine.  You have pain that does not improve in 1 week.  You have new symptoms.  You are generally not feeling well. SEEK   IMMEDIATE MEDICAL CARE IF:   You have pain that radiates from your back into your legs.  You develop new bowel or bladder control problems.  You have unusual weakness or numbness in your arms or legs.  You develop nausea or vomiting.  You develop abdominal pain.  You feel faint. Document Released: 03/12/2005 Document Revised: 09/11/2011 Document Reviewed: 07/14/2013 ExitCare Patient Information 2015 [employer]. This information is not intended to replace advice given to you by your health care provider. Make sure you  discuss any questions you have with your health care provider.  

## 2014-11-05 ENCOUNTER — Other Ambulatory Visit: Payer: Self-pay | Admitting: Family Medicine

## 2014-11-05 DIAGNOSIS — Z1231 Encounter for screening mammogram for malignant neoplasm of breast: Secondary | ICD-10-CM

## 2014-11-08 ENCOUNTER — Emergency Department: Payer: Medicare Other

## 2014-11-08 ENCOUNTER — Emergency Department
Admission: EM | Admit: 2014-11-08 | Discharge: 2014-11-08 | Disposition: A | Discharge status: Home / Self Care | Payer: Medicare Other | Attending: Emergency Medicine | Admitting: Emergency Medicine

## 2014-11-08 ENCOUNTER — Telehealth: Payer: Self-pay | Admitting: Family Medicine

## 2014-11-08 ENCOUNTER — Encounter: Payer: Self-pay | Admitting: *Deleted

## 2014-11-08 DIAGNOSIS — M791 Myalgia: Secondary | ICD-10-CM | POA: Insufficient documentation

## 2014-11-08 DIAGNOSIS — Z79899 Other long term (current) drug therapy: Secondary | ICD-10-CM | POA: Diagnosis not present

## 2014-11-08 DIAGNOSIS — I10 Essential (primary) hypertension: Secondary | ICD-10-CM | POA: Insufficient documentation

## 2014-11-08 DIAGNOSIS — Z87891 Personal history of nicotine dependence: Secondary | ICD-10-CM | POA: Insufficient documentation

## 2014-11-08 DIAGNOSIS — M7918 Myalgia, other site: Secondary | ICD-10-CM

## 2014-11-08 DIAGNOSIS — R102 Pelvic and perineal pain: Secondary | ICD-10-CM | POA: Diagnosis not present

## 2014-11-08 DIAGNOSIS — M545 Low back pain: Secondary | ICD-10-CM | POA: Diagnosis not present

## 2014-11-08 MED ORDER — HYDROCODONE-ACETAMINOPHEN 5-325 MG PO TABS
1.0000 | ORAL_TABLET | Freq: Once | ORAL | Status: AC
  Administered 2014-11-08: 1 via ORAL
  Filled 2014-11-08: qty 1

## 2014-11-08 MED ORDER — HYDROCODONE-ACETAMINOPHEN 5-325 MG PO TABS: 1.0000 | ORAL_TABLET | ORAL | Status: DC | PRN

## 2014-11-08 NOTE — Telephone Encounter (Signed)
Pt was discharged from [facility] ER on 11/08/2014 for back pain.  I have schedule hospital follow up for 11/11/2014/MW

## 2014-11-08 NOTE — ED Notes (Signed)
Pt. States going to PCP this past week for sciatica, pt. States she was given methocarbamol but states it has not given her much relief.  Pt. States unable to sleep tonight due to pain in rt. Lower back and hip.  Pt. States she also took tylenol with no relief.

## 2014-11-08 NOTE — Telephone Encounter (Signed)
Spoke with patient. Pt was told it was from sciatic nerve. Did Devonne Doughty were ok no fracture. RX for pain medication #20 tablets.

## 2014-11-08 NOTE — ED Notes (Signed)
Pt has hx of sciatica and is experiencing uncontrolled pain in R lower back and hip. Pt started treatment for this pain last week but since Saturday has experienced no relief.

## 2014-11-08 NOTE — ED Provider Notes (Signed)
[facility] Emergency Department Provider Note  ____________________________________________  Time seen: 7 AM  I have reviewed the triage vital signs and the nursing notes.   HISTORY  Chief Complaint Back Pain    HPI Kathryn [name] is a 81 y.o. female who presents with complaints of right low back/buttock pain radiating to her leg. She reports the pain is moderate to severe sharp in nature. She has had it since Monday of last week. She saw her PCP on Tuesday so did her on antibiotics for his she has not had any relief. She is able to ambulate. The pain is lessened when she is not moving. She denies fevers chills. No dysuria. No frequency. No focal weakness or sensory deficits.    Past Medical History  Diagnosis Date  . Hypertension     PCP Dr Miguel Aschoff   [place]  . Shortness of breath   . Pneumonia   . Hypothyroidism   . Peripheral vascular disease     varicose veins left leg  . Gastric ulcer 4/12    treated with Prolisec- states no problems now  . Neuromuscular disorder     slight numbness right toes- comes and goes  . Skin cancer     multiple from face  . Seasonal allergies   . Arthritis     spinal stenosis  . Hyperlipidemia     Patient Active Problem List   Diagnosis Date Noted  . Absolute anemia 09/17/2014  . COPD, mild 09/17/2014  . Clinical depression 09/17/2014  . Elevated platelet count 09/17/2014  . Elevated erythrocyte sedimentation rate 09/17/2014  . History of tobacco use 09/17/2014  . Hypercholesteremia 09/17/2014  . BP (high blood pressure) 09/17/2014  . Adult hypothyroidism 09/17/2014  . Cannot sleep 09/17/2014  . Anemia, iron deficiency 09/17/2014  . Lumbar disc disease with radiculopathy 09/17/2014  . Arthritis, degenerative 09/17/2014  . Excess weight 09/17/2014  . Vertebrobasilar circulation transient ischemic attack 09/17/2014  . Lung nodule, multiple 09/17/2014  . Basilar artery insufficiency 09/17/2014   . Neuritis or radiculitis due to rupture of lumbar intervertebral disc 08/25/2013  . Degenerative arthritis of lumbar spine 08/25/2013  . Lumbar canal stenosis 08/25/2013  . Smoker 07/15/2012  . Back pain 07/15/2012  . SOB (shortness of breath) 07/15/2012  . Hyperlipidemia 07/15/2012  . Essential hypertension 07/15/2012    Past Surgical History  Procedure Laterality Date  . Back surgery      4/12  Dr Shellia Carwin- for lumbar stenosis  . Breast surgery  1962    lumpectomy with biopsy   left  . Eye surgery      cataract extraction with IOL bilaterally  . Carpal tunnel release      right  . Colonoscopy    . Esophagogastroduodenoscopy    . Right oophorectomy      due to STAPH INFECTION  . Lumbar laminectomy/decompression microdiscectomy  05/24/2011    Procedure: LUMBAR LAMINECTOMY/DECOMPRESSION MICRODISCECTOMY 2 LEVELS;  Surgeon: Magnus Sinning, MD;  Location: [facility];  Service: Orthopedics;  Laterality: N/A;  L2-L3, L3-L4 (x-ray)  . Joint replacement      Current Outpatient Rx  Name  Route  Sig  Dispense  Refill  . amLODipine (NORVASC) 5 MG tablet   Oral   Take 5 mg by mouth daily before breakfast.         . atorvastatin (LIPITOR) 40 MG tablet      TAKE 1 TABLET BY MOUTH EVERY DAY   30 tablet  5   . cetirizine (ZYRTEC) 10 MG tablet   Oral   Take 10 mg by mouth at bedtime.          . DULoxetine (CYMBALTA) 30 MG capsule   Oral   Take by mouth.         . esomeprazole (NEXIUM) 40 MG capsule   Oral   Take by mouth.         . hydrOXYzine (ATARAX/VISTARIL) 10 MG tablet   Oral   Take by mouth.         . levothyroxine (SYNTHROID, LEVOTHROID) 100 MCG tablet   Oral   Take 100 mcg by mouth daily before breakfast.         . lisinopril (PRINIVIL,ZESTRIL) 10 MG tablet   Oral   Take 10 mg by mouth at bedtime.         . methocarbamol (ROBAXIN) 500 MG tablet   Oral   Take 1 tablet (500 mg total) by mouth 4 (four) times daily.   30 tablet   0   .  omeprazole (PRILOSEC) 20 MG capsule   Oral   Take 20 mg by mouth daily.         Marland Kitchen oxymetazoline (AFRIN) 0.05 % nasal spray   Nasal   Place 1 spray into the nose at bedtime.         Marland Kitchen tiotropium (SPIRIVA HANDIHALER) 18 MCG inhalation capsule   Inhalation   Place into inhaler and inhale.         Marland Kitchen acetaminophen (TYLENOL) 500 MG tablet   Oral   Take 1,000 mg by mouth every 6 (six) hours as needed. Pain            Allergies Oxycodone and Prednisone  Family History  Problem Relation Age of Onset  . Dementia Mother   . Lung cancer Father   . Heart disease Father     Social History Social History  Substance Use Topics  . Smoking status: Former Smoker -- 0.50 packs/day for 50 years    Types: Cigarettes    Quit date: 05/16/2004  . Smokeless tobacco: Never Used  . Alcohol Use: Yes     Comment: 2-3 glasses wine week    Review of Systems  Constitutional: Negative for fever. Eyes: Negative for visual changes. Genitourinary: Negative for dysuria. Musculoskeletal: Positive for right buttock pain Skin: Negative for rash. Neurological: Negative for headaches or focal weakness   ____________________________________________   PHYSICAL EXAM:  VITAL SIGNS: ED Triage Vitals  Enc Vitals Group     BP 11/08/14 0535 115/78 mmHg     Pulse Rate 11/08/14 0535 92     Resp 11/08/14 0535 20     Temp 11/08/14 0535 97.8 F (36.6 C)     Temp Source 11/08/14 0535 Oral     SpO2 11/08/14 0535 98 %     Weight 11/08/14 0535 194 lb (87.998 kg)     Height 11/08/14 0535 5\' 4"  (1.626 m)     Head Cir --      Peak Flow --      Pain Score 11/08/14 0536 9     Pain Loc --      Pain Edu? --      Excl. in Advance? --      Constitutional: Alert and oriented. Well appearing and in no distress. Pleasant and interactive Eyes: Conjunctivae are normal.  ENT   Head: Normocephalic and atraumatic.   Mouth/Throat: Mucous membranes are moist. Cardiovascular: Normal rate,  regular rhythm.   Respiratory: Normal respiratory effort without tachypnea nor retractions.  Gastrointestinal: Soft and non-tender in all quadrants. No distention. There is no CVA tenderness. Musculoskeletal: Patient with tenderness to palpation in the lateral portion of the gluteus maximus on the right. She may have a muscle spasm. She has normal strength in her legs. She has no vertebral tenderness to palpation. Neurologic:  Normal speech and language. No focal neurologic deficits are appreciated. Normal sensation Skin:  Skin is warm, dry and intact. No rash noted. Psychiatric: Mood and affect are normal. Patient exhibits appropriate insight and judgment.  ____________________________________________    LABS (pertinent positives/negatives)  Labs Reviewed - No data to display  ____________________________________________     ____________________________________________    RADIOLOGY I have personally reviewed any xrays that were ordered on this patient: X-ray pelvis unremarkable, bilateral hip implants  ____________________________________________   PROCEDURES  Procedure(s) performed: none   ____________________________________________   INITIAL IMPRESSION / ASSESSMENT AND PLAN / ED COURSE  Pertinent labs & imaging results that were available during my care of the patient were reviewed by me and considered in my medical decision making (see chart for details).  Patient is requesting short pain medication. We will give by mouth Vicodin in the emergency department which she has tolerated before. We will obtain pelvis x-ray to rule out bony abnormalities although she does not report a history of trauma. Pain is too low to be a kidney stone. It does seem to be entirely muscular skeletal.   Her x-rays normal and She has good PCP follow-up so we will treat her pain with analgesic's and have her follow-up with her PCP ____________________________________________   FINAL CLINICAL  IMPRESSION(S) / ED DIAGNOSES  Final diagnoses:  Musculoskeletal pain     Lavonia Drafts, MD 11/08/14 [phone]

## 2014-11-08 NOTE — Discharge Instructions (Signed)

## 2014-11-08 NOTE — ED Notes (Signed)
Pt. States same kind of pain happened a couple years ago to other leg.

## 2014-11-11 ENCOUNTER — Ambulatory Visit
Admission: RE | Admit: 2014-11-11 | Discharge: 2014-11-11 | Disposition: A | Discharge status: Home / Self Care | Payer: Medicare Other | Source: Ambulatory Visit | Attending: Family Medicine | Admitting: Family Medicine

## 2014-11-11 ENCOUNTER — Other Ambulatory Visit: Payer: Self-pay | Admitting: Family Medicine

## 2014-11-11 ENCOUNTER — Encounter: Payer: Self-pay | Admitting: Family Medicine

## 2014-11-11 ENCOUNTER — Ambulatory Visit (INDEPENDENT_AMBULATORY_CARE_PROVIDER_SITE_OTHER): Payer: Medicare Other | Admitting: Family Medicine

## 2014-11-11 VITALS — BP 122/54 | HR 102 | Temp 98.1°F | Resp 18 | Wt 204.0 lb

## 2014-11-11 DIAGNOSIS — M545 Low back pain, unspecified: Secondary | ICD-10-CM

## 2014-11-11 DIAGNOSIS — Z1231 Encounter for screening mammogram for malignant neoplasm of breast: Secondary | ICD-10-CM | POA: Insufficient documentation

## 2014-11-11 MED ORDER — HYDROCODONE-ACETAMINOPHEN 5-325 MG PO TABS: 1.0000 | ORAL_TABLET | ORAL | Status: DC | PRN

## 2014-11-11 NOTE — Progress Notes (Signed)
Patient ID: Kathryn [name], female   DOB: 30-Nov-1933, 81 y.o.   MRN: [id number]    Subjective:  HPI  Back pain follow up: Patient was seen in ER on August 15th due to low right side back pain and right hip area. Shad xray done which showed no fractures. She was given Norco #20.  She saw Simona Huh on August 9th prior to ER visit and was put on Methacrbamol but pain did not get better and became more severe where she hard time sleeping so she went to ER. She has been taking Norco regularly and she feels much better. No leg weakness, no tingling or numbness sensation in her legs.   Prior to Admission medications   Medication Sig Start Date End Date Taking? Authorizing Provider  acetaminophen (TYLENOL) 500 MG tablet Take 1,000 mg by mouth every 6 (six) hours as needed. Pain    Yes Historical Provider, MD  amLODipine (NORVASC) 5 MG tablet Take 5 mg by mouth daily before breakfast.   Yes Historical Provider, MD  atorvastatin (LIPITOR) 40 MG tablet TAKE 1 TABLET BY MOUTH EVERY DAY 09/17/14  Yes Jerrol Banana., MD  cetirizine (ZYRTEC) 10 MG tablet Take 10 mg by mouth at bedtime.    Yes Historical Provider, MD  DULoxetine (CYMBALTA) 30 MG capsule Take by mouth.   Yes Historical Provider, MD  HYDROcodone-acetaminophen (NORCO/VICODIN) 5-325 MG per tablet Take 1 tablet by mouth every 4 (four) hours as needed for moderate pain. 11/08/14  Yes Lavonia Drafts, MD  hydrOXYzine (ATARAX/VISTARIL) 10 MG tablet Take by mouth. 10/19/13  Yes Historical Provider, MD  levothyroxine (SYNTHROID, LEVOTHROID) 100 MCG tablet Take 100 mcg by mouth daily before breakfast.   Yes Historical Provider, MD  lisinopril (PRINIVIL,ZESTRIL) 10 MG tablet Take 10 mg by mouth at bedtime.   Yes Historical Provider, MD  omeprazole (PRILOSEC) 20 MG capsule Take 20 mg by mouth daily.   Yes Historical Provider, MD  oxymetazoline (AFRIN) 0.05 % nasal spray Place 1 spray into the nose at bedtime.   Yes Historical Provider, MD  tiotropium  (SPIRIVA HANDIHALER) 18 MCG inhalation capsule Place into inhaler and inhale. 08/17/14  Yes Historical Provider, MD    Patient Active Problem List   Diagnosis Date Noted  . Absolute anemia 09/17/2014  . COPD, mild 09/17/2014  . Clinical depression 09/17/2014  . Elevated platelet count 09/17/2014  . Elevated erythrocyte sedimentation rate 09/17/2014  . History of tobacco use 09/17/2014  . Hypercholesteremia 09/17/2014  . BP (high blood pressure) 09/17/2014  . Adult hypothyroidism 09/17/2014  . Cannot sleep 09/17/2014  . Anemia, iron deficiency 09/17/2014  . Lumbar disc disease with radiculopathy 09/17/2014  . Arthritis, degenerative 09/17/2014  . Excess weight 09/17/2014  . Vertebrobasilar circulation transient ischemic attack 09/17/2014  . Lung nodule, multiple 09/17/2014  . Basilar artery insufficiency 09/17/2014  . Neuritis or radiculitis due to rupture of lumbar intervertebral disc 08/25/2013  . Degenerative arthritis of lumbar spine 08/25/2013  . Lumbar canal stenosis 08/25/2013  . Smoker 07/15/2012  . Back pain 07/15/2012  . SOB (shortness of breath) 07/15/2012  . Hyperlipidemia 07/15/2012  . Essential hypertension 07/15/2012    Past Medical History  Diagnosis Date  . Hypertension     PCP Dr Miguel Aschoff   [place]  . Shortness of breath   . Pneumonia   . Hypothyroidism   . Peripheral vascular disease     varicose veins left leg  . Gastric ulcer 4/12    treated with Prolisec-  states no problems now  . Neuromuscular disorder     slight numbness right toes- comes and goes  . Skin cancer     multiple from face  . Seasonal allergies   . Arthritis     spinal stenosis  . Hyperlipidemia     Social History   Social History  . Marital Status: Widowed    Spouse Name: N/A  . Number of Children: N/A  . Years of Education: N/A   Occupational History  . Not on file.   Social History Main Topics  . Smoking status: Former Smoker -- 0.50 packs/day for 50 years     Types: Cigarettes    Quit date: 05/16/2004  . Smokeless tobacco: Never Used  . Alcohol Use: Yes     Comment: 2-3 glasses wine week  . Drug Use: No  . Sexual Activity: Yes    Birth Control/ Protection: None   Other Topics Concern  . Not on file   Social History Narrative    Allergies  Allergen Reactions  . Oxycodone Other (See Comments)  . Prednisone     swelling, bruising.    Review of Systems  Constitutional: Negative.   Respiratory: Negative.   Cardiovascular: Negative.   Gastrointestinal: Negative.   Genitourinary: Negative.   Musculoskeletal: Positive for back pain and joint pain. Negative for myalgias, falls and neck pain.  Neurological: Negative for dizziness, tingling and headaches.  Psychiatric/Behavioral: Negative.     Immunization History  Administered Date(s) Administered  . Tdap 02/10/2013  . Zoster 02/10/2013   Objective:  BP 122/54 mmHg  Pulse 102  Temp(Src) 98.1 F (36.7 C)  Resp 18  Wt 204 lb (92.534 kg)  Physical Exam  Constitutional: She is oriented to person, place, and time and well-developed, well-nourished, and in no distress.  HENT:  Head: Normocephalic and atraumatic.  Right Ear: External ear normal.  Left Ear: External ear normal.  Nose: Nose normal.  Eyes: Conjunctivae are normal. Pupils are equal, round, and reactive to light.  Neck: Normal range of motion. Neck supple.  Cardiovascular: Normal rate, regular rhythm, normal heart sounds and intact distal pulses.  Exam reveals no gallop.   No murmur heard. Pulmonary/Chest: Effort normal and breath sounds normal. No respiratory distress. She has no wheezes.  Abdominal: Soft.  Musculoskeletal: She exhibits no edema or tenderness.  Neurological: She is alert and oriented to person, place, and time.  Skin: Skin is warm and dry.  Psychiatric: Mood, memory, affect and judgment normal.    Lab Results  Component Value Date   WBC 6.0 06/23/2014   HGB 11.1* 07/09/2014   HCT 40.1  06/23/2014   PLT 210 07/09/2014   GLUCOSE 111* 09/17/2014   INR 1.0 06/23/2014    CMP     Component Value Date/Time   NA 141 09/17/2014 1218   NA 136 07/09/2014 0529   NA 136 05/17/2011 1210   K 5.0 09/17/2014 1218   K 4.6 07/09/2014 0529   CL 99 09/17/2014 1218   CL 104 07/09/2014 0529   CO2 23 09/17/2014 1218   CO2 28 07/09/2014 0529   GLUCOSE 111* 09/17/2014 1218   GLUCOSE 169* 07/09/2014 0529   GLUCOSE 109* 05/17/2011 1210   BUN 17 09/17/2014 1218   BUN 20 07/09/2014 0529   BUN 19 05/17/2011 1210   CREATININE 0.68 09/17/2014 1218   CREATININE 0.85 07/09/2014 0529   CALCIUM 9.7 09/17/2014 1218   CALCIUM 8.6* 07/09/2014 0529   GFRNONAA 84 09/17/2014 1218  GFRNONAA >60 07/09/2014 0529   GFRAA 97 09/17/2014 1218   GFRAA >60 07/09/2014 0529    Assessment and Plan :  1. Right-sided low back pain without sciatica Improved. Refill given for Norco. Follow as needed. 2. Osteoarthritis/DDD 3. Hypertension 4. Hyperlipidemia  Miguel Aschoff MD [facility] 11/11/2014 1:55 PM

## 2014-11-19 DIAGNOSIS — Z96652 Presence of left artificial knee joint: Secondary | ICD-10-CM | POA: Diagnosis not present

## 2014-11-26 DIAGNOSIS — Z23 Encounter for immunization: Secondary | ICD-10-CM | POA: Diagnosis not present

## 2014-12-05 ENCOUNTER — Other Ambulatory Visit: Payer: Self-pay | Admitting: Family Medicine

## 2015-02-23 DIAGNOSIS — L400 Psoriasis vulgaris: Secondary | ICD-10-CM | POA: Diagnosis not present

## 2015-02-23 DIAGNOSIS — Z1283 Encounter for screening for malignant neoplasm of skin: Secondary | ICD-10-CM | POA: Diagnosis not present

## 2015-02-23 DIAGNOSIS — L578 Other skin changes due to chronic exposure to nonionizing radiation: Secondary | ICD-10-CM | POA: Diagnosis not present

## 2015-02-23 DIAGNOSIS — L820 Inflamed seborrheic keratosis: Secondary | ICD-10-CM | POA: Diagnosis not present

## 2015-02-23 DIAGNOSIS — B078 Other viral warts: Secondary | ICD-10-CM | POA: Diagnosis not present

## 2015-02-23 DIAGNOSIS — L570 Actinic keratosis: Secondary | ICD-10-CM | POA: Diagnosis not present

## 2015-02-23 DIAGNOSIS — Z85828 Personal history of other malignant neoplasm of skin: Secondary | ICD-10-CM | POA: Diagnosis not present

## 2015-03-21 ENCOUNTER — Other Ambulatory Visit: Payer: Self-pay | Admitting: Family Medicine

## 2015-05-02 ENCOUNTER — Other Ambulatory Visit: Payer: Self-pay | Admitting: Family Medicine

## 2015-05-04 ENCOUNTER — Other Ambulatory Visit: Payer: Self-pay | Admitting: Family Medicine

## 2015-05-10 ENCOUNTER — Ambulatory Visit (INDEPENDENT_AMBULATORY_CARE_PROVIDER_SITE_OTHER): Payer: Medicare Other | Admitting: Family Medicine

## 2015-05-10 VITALS — BP 142/80 | HR 100 | Temp 98.1°F | Resp 18 | Wt 210.0 lb

## 2015-05-10 DIAGNOSIS — J441 Chronic obstructive pulmonary disease with (acute) exacerbation: Secondary | ICD-10-CM | POA: Diagnosis not present

## 2015-05-10 DIAGNOSIS — J42 Unspecified chronic bronchitis: Secondary | ICD-10-CM | POA: Diagnosis not present

## 2015-05-10 DIAGNOSIS — J069 Acute upper respiratory infection, unspecified: Secondary | ICD-10-CM | POA: Diagnosis not present

## 2015-05-10 MED ORDER — AMOXICILLIN-POT CLAVULANATE 875-125 MG PO TABS: 1.0000 | ORAL_TABLET | Freq: Two times a day (BID) | ORAL | Status: DC

## 2015-05-10 MED ORDER — HYDROCODONE-HOMATROPINE 5-1.5 MG/5ML PO SYRP: 5.0000 mL | ORAL_SOLUTION | Freq: Four times a day (QID) | ORAL | Status: DC | PRN

## 2015-05-10 NOTE — Progress Notes (Signed)
Patient ID: Kathryn [name], female   DOB: 12/04/1933, 82 y.o.   MRN: [id number]   Kathryn [name]  MRN: [id number] DOB: 1933-08-08  Subjective:  HPI  1. Upper respiratory infection The patient is a 82 year old female who presents for evaluation of upper respiratory infection.  The patient states she started with symptoms 1 week ago.  She began with a non productive cough until yesterday.  Today it is a little bit looser and she has started to cough up a little bit of yellow sputum.  Her cough is keeping her awake and beginning to hurt her back and ribs.  She is sleeping propped on pillows.  She does not feel she has run any fever and she states she is not having any difficult breathing but does admit getting a little bit more short of breath if she is doing a lot.  Patient Active Problem List   Diagnosis Date Noted  . Absolute anemia 09/17/2014  . COPD, mild ([place]) 09/17/2014  . Clinical depression 09/17/2014  . Elevated platelet count ([place]) 09/17/2014  . Elevated erythrocyte sedimentation rate 09/17/2014  . History of tobacco use 09/17/2014  . Hypercholesteremia 09/17/2014  . BP (high blood pressure) 09/17/2014  . Adult hypothyroidism 09/17/2014  . Cannot sleep 09/17/2014  . Anemia, iron deficiency 09/17/2014  . Lumbar disc disease with radiculopathy 09/17/2014  . Arthritis, degenerative 09/17/2014  . Excess weight 09/17/2014  . Vertebrobasilar circulation transient ischemic attack 09/17/2014  . Lung nodule, multiple 09/17/2014  . Basilar artery insufficiency 09/17/2014  . Neuritis or radiculitis due to rupture of lumbar intervertebral disc 08/25/2013  . Degenerative arthritis of lumbar spine 08/25/2013  . Lumbar canal stenosis 08/25/2013  . Smoker 07/15/2012  . Back pain 07/15/2012  . SOB (shortness of breath) 07/15/2012  . Hyperlipidemia 07/15/2012  . Essential hypertension 07/15/2012    Past Medical History  Diagnosis Date  . Hypertension     PCP Dr Miguel Aschoff    [place]  . Shortness of breath   . Pneumonia   . Hypothyroidism   . Peripheral vascular disease     varicose veins left leg  . Gastric ulcer 4/12    treated with Prolisec- states no problems now  . Neuromuscular disorder     slight numbness right toes- comes and goes  . Skin cancer     multiple from face  . Seasonal allergies   . Arthritis     spinal stenosis  . Hyperlipidemia     Social History   Social History  . Marital Status: Widowed    Spouse Name: N/A  . Number of Children: N/A  . Years of Education: N/A   Occupational History  . Not on file.   Social History Main Topics  . Smoking status: Former Smoker -- 0.50 packs/day for 50 years    Types: Cigarettes    Quit date: 05/16/2004  . Smokeless tobacco: Never Used  . Alcohol Use: Yes     Comment: 2-3 glasses wine week  . Drug Use: No  . Sexual Activity: Yes    Birth Control/ Protection: None   Other Topics Concern  . Not on file   Social History Narrative    Outpatient Prescriptions Prior to Visit  Medication Sig Dispense Refill  . acetaminophen (TYLENOL) 500 MG tablet Take 1,000 mg by mouth every 6 (six) hours as needed. Pain     . amLODipine (NORVASC) 5 MG tablet TAKE 1 TABLET BY MOUTH EVERY DAY FOR HYPERTENSION 30  tablet 5  . atorvastatin (LIPITOR) 40 MG tablet TAKE 1 TABLET BY MOUTH EVERY DAY 30 tablet 3  . cetirizine (ZYRTEC) 10 MG tablet Take 10 mg by mouth at bedtime.     . DULoxetine (CYMBALTA) 30 MG capsule Take by mouth.    Marland Kitchen HYDROcodone-acetaminophen (NORCO/VICODIN) 5-325 MG per tablet Take 1 tablet by mouth every 4 (four) hours as needed for moderate pain. 100 tablet 0  . hydrOXYzine (ATARAX/VISTARIL) 10 MG tablet TAKE 1 TO 2 TABLETS BY MOUTH EVERY 6 HOURS AS NEEDED 100 tablet 12  . levothyroxine (SYNTHROID, LEVOTHROID) 100 MCG tablet Take 100 mcg by mouth daily before breakfast.    . lisinopril (PRINIVIL,ZESTRIL) 10 MG tablet TAKE 1 TABLET BY MOUTH EVERY NIGHT AT BEDTIME FOR HYPERTENSION 30  tablet 0  . omeprazole (PRILOSEC) 20 MG capsule Take 20 mg by mouth daily.    Marland Kitchen oxymetazoline (AFRIN) 0.05 % nasal spray Place 1 spray into the nose at bedtime.    Marland Kitchen tiotropium (SPIRIVA HANDIHALER) 18 MCG inhalation capsule Place into inhaler and inhale.     No facility-administered medications prior to visit.    Allergies  Allergen Reactions  . Oxycodone Other (See Comments)  . Prednisone     swelling, bruising.    Review of Systems  Constitutional: Positive for malaise/fatigue. Negative for fever, chills and diaphoresis.  HENT: Positive for congestion and tinnitus. Negative for ear discharge, ear pain, hearing loss, nosebleeds and sore throat.   Eyes: Negative for blurred vision, double vision, photophobia, pain, discharge and redness.  Respiratory: Positive for cough and sputum production (minimal). Negative for hemoptysis, shortness of breath, wheezing and stridor.   Cardiovascular: Negative for chest pain, palpitations, orthopnea and leg swelling.  Gastrointestinal: Negative.   Neurological: Positive for weakness. Negative for headaches.  Endo/Heme/Allergies: Negative.   Psychiatric/Behavioral: Negative.    Objective:  BP 142/80 mmHg  Pulse 100  Temp(Src) 98.1 F (36.7 C) (Oral)  Resp 18  Wt 210 lb (95.255 kg)  Physical Exam  Constitutional: She is well-developed, well-nourished, and in no distress.  HENT:  Head: Normocephalic and atraumatic.  Right Ear: External ear normal.  Left Ear: External ear normal.  Nose: Nose normal.  Mouth/Throat: Oropharynx is clear and moist.  Eyes: Conjunctivae and EOM are normal. Pupils are equal, round, and reactive to light.  Neck: Normal range of motion. Neck supple.  Cardiovascular: Normal rate, normal heart sounds and intact distal pulses.   Early systolic murmur at the Right Upper Sternal border, I-II/VI  Pulmonary/Chest: Effort normal and breath sounds normal.  Skin: Skin is warm and dry.  Psychiatric: Mood, memory, affect and  judgment normal.    Assessment and Plan :   1. Upper respiratory infection   2. Chronic bronchitis, unspecified chronic bronchitis type (HCC)  - amoxicillin-clavulanate (AUGMENTIN) 875-125 MG tablet; Take 1 tablet by mouth 2 (two) times daily.  Dispense: 14 tablet; Refill: 1 Hycodan for cough  3. COPD exacerbation (HCC)  - amoxicillin-clavulanate (AUGMENTIN) 875-125 MG tablet; Take 1 tablet by mouth 2 (two) times daily.  Dispense: 14 tablet; Refill: 1 Would use doxycycline if this does not resolve with Augmentin. She is to use her home nebulizer. I have done the exam and reviewed the above chart and it is accurate to the best of my knowledge.   Miguel Aschoff MD [facility] 05/10/2015 8:30 AM

## 2015-05-16 ENCOUNTER — Ambulatory Visit (INDEPENDENT_AMBULATORY_CARE_PROVIDER_SITE_OTHER): Payer: Medicare Other | Admitting: Family Medicine

## 2015-05-16 VITALS — BP 134/72 | HR 76 | Temp 98.0°F | Resp 18 | Wt 205.0 lb

## 2015-05-16 DIAGNOSIS — J069 Acute upper respiratory infection, unspecified: Secondary | ICD-10-CM | POA: Diagnosis not present

## 2015-05-16 DIAGNOSIS — I10 Essential (primary) hypertension: Secondary | ICD-10-CM | POA: Diagnosis not present

## 2015-05-16 DIAGNOSIS — K635 Polyp of colon: Secondary | ICD-10-CM | POA: Diagnosis not present

## 2015-05-16 NOTE — Progress Notes (Signed)
Patient ID: Kathryn [name], female   DOB: 01-30-1934, 82 y.o.   MRN: [id number]   Kathryn [name]  MRN: [id number] DOB: July 18, 1933  Subjective:  HPI  1. Essential hypertension The patient is a 82 year old female who presents for follow up of her hypertension.  She was last seen on 05/10/15 and no management changes were made at that time.  Her blood pressure in the office that day was 142/80.  She is currently on Lisinopril and Amlodipine.  She reports good compliance and tolerance of her medications.  2. Upper respiratory infection The patient was also seen last week for an URI.  She state she is a little better.  She still has a bad cough and no energy.  She states that she has had 1 or 2 nights where she was able to sleep through the night.    She also notes that since her last visit she has had a GI bug with vomiting but no diarrhea or fever.  Those symptoms have since resolved.   Patient Active Problem List   Diagnosis Date Noted  . Absolute anemia 09/17/2014  . COPD, mild ([place]) 09/17/2014  . Clinical depression 09/17/2014  . Elevated platelet count ([place]) 09/17/2014  . Elevated erythrocyte sedimentation rate 09/17/2014  . History of tobacco use 09/17/2014  . Hypercholesteremia 09/17/2014  . BP (high blood pressure) 09/17/2014  . Adult hypothyroidism 09/17/2014  . Cannot sleep 09/17/2014  . Anemia, iron deficiency 09/17/2014  . Lumbar disc disease with radiculopathy 09/17/2014  . Arthritis, degenerative 09/17/2014  . Excess weight 09/17/2014  . Vertebrobasilar circulation transient ischemic attack 09/17/2014  . Lung nodule, multiple 09/17/2014  . Basilar artery insufficiency 09/17/2014  . Neuritis or radiculitis due to rupture of lumbar intervertebral disc 08/25/2013  . Degenerative arthritis of lumbar spine 08/25/2013  . Lumbar canal stenosis 08/25/2013  . Smoker 07/15/2012  . Back pain 07/15/2012  . SOB (shortness of breath) 07/15/2012  . Hyperlipidemia 07/15/2012  .  Essential hypertension 07/15/2012    Past Medical History  Diagnosis Date  . Hypertension     PCP Dr Miguel Aschoff   [place]  . Shortness of breath   . Pneumonia   . Hypothyroidism   . Peripheral vascular disease     varicose veins left leg  . Gastric ulcer 4/12    treated with Prolisec- states no problems now  . Neuromuscular disorder     slight numbness right toes- comes and goes  . Skin cancer     multiple from face  . Seasonal allergies   . Arthritis     spinal stenosis  . Hyperlipidemia     Social History   Social History  . Marital Status: Widowed    Spouse Name: N/A  . Number of Children: N/A  . Years of Education: N/A   Occupational History  . Not on file.   Social History Main Topics  . Smoking status: Former Smoker -- 0.50 packs/day for 50 years    Types: Cigarettes    Quit date: 05/16/2004  . Smokeless tobacco: Never Used  . Alcohol Use: Yes     Comment: 2-3 glasses wine week  . Drug Use: No  . Sexual Activity: Yes    Birth Control/ Protection: None   Other Topics Concern  . Not on file   Social History Narrative    Outpatient Prescriptions Prior to Visit  Medication Sig Dispense Refill  . acetaminophen (TYLENOL) 500 MG tablet Take 1,000 mg by mouth  every 6 (six) hours as needed. Pain     . amLODipine (NORVASC) 5 MG tablet TAKE 1 TABLET BY MOUTH EVERY DAY FOR HYPERTENSION 30 tablet 5  . atorvastatin (LIPITOR) 40 MG tablet TAKE 1 TABLET BY MOUTH EVERY DAY 30 tablet 3  . cetirizine (ZYRTEC) 10 MG tablet Take 10 mg by mouth at bedtime.     . DULoxetine (CYMBALTA) 30 MG capsule Take by mouth.    Marland Kitchen HYDROcodone-acetaminophen (NORCO/VICODIN) 5-325 MG per tablet Take 1 tablet by mouth every 4 (four) hours as needed for moderate pain. 100 tablet 0  . HYDROcodone-homatropine (HYCODAN) 5-1.5 MG/5ML syrup Take 5 mLs by mouth every 6 (six) hours as needed for cough. 120 mL 0  . hydrOXYzine (ATARAX/VISTARIL) 10 MG tablet TAKE 1 TO 2 TABLETS BY MOUTH  EVERY 6 HOURS AS NEEDED 100 tablet 12  . levothyroxine (SYNTHROID, LEVOTHROID) 100 MCG tablet Take 100 mcg by mouth daily before breakfast.    . lisinopril (PRINIVIL,ZESTRIL) 10 MG tablet TAKE 1 TABLET BY MOUTH EVERY NIGHT AT BEDTIME FOR HYPERTENSION 30 tablet 0  . omeprazole (PRILOSEC) 20 MG capsule Take 20 mg by mouth daily.    Marland Kitchen oxymetazoline (AFRIN) 0.05 % nasal spray Place 1 spray into the nose at bedtime.    Marland Kitchen tiotropium (SPIRIVA HANDIHALER) 18 MCG inhalation capsule Place into inhaler and inhale.    Marland Kitchen amoxicillin-clavulanate (AUGMENTIN) 875-125 MG tablet Take 1 tablet by mouth 2 (two) times daily. 14 tablet 1   No facility-administered medications prior to visit.    Allergies  Allergen Reactions  . Oxycodone Other (See Comments)    Mental status change  . Prednisone     swelling, bruising.    Review of Systems  Constitutional: Positive for malaise/fatigue. Negative for fever, chills and diaphoresis.  Respiratory: Positive for cough, sputum production (off and on with a little bit of clear production.) and shortness of breath (chronic but unchanged). Negative for wheezing.   Cardiovascular: Negative for chest pain, palpitations, orthopnea and leg swelling.  Neurological: Negative for dizziness, weakness and headaches.   Objective:  BP 134/72 mmHg  Pulse 76  Temp(Src) 98 F (36.7 C) (Oral)  Resp 18  Wt 205 lb (92.987 kg)  Physical Exam  Constitutional: She is well-developed, well-nourished, and in no distress.  HENT:  Head: Normocephalic and atraumatic.  Right Ear: External ear normal.  Left Ear: External ear normal.  Nose: Nose normal.  Mouth/Throat: Oropharynx is clear and moist.  Eyes: Conjunctivae are normal. Pupils are equal, round, and reactive to light.  Neck: Normal range of motion.  Cardiovascular: Normal rate, regular rhythm and normal heart sounds.   Pulmonary/Chest: Effort normal and breath sounds normal.  Abdominal: Soft.  Musculoskeletal: She exhibits  no edema.  Skin: Skin is warm and dry.  Psychiatric: Mood, memory, affect and judgment normal.    Assessment and Plan :   1. Essential hypertension Stable, follow up in 3-4 months.  2. Upper respiratory infection Slowly improving, follow up as needed.  3. Polyp of colon Patient has history of colonic polyp and it is time to schedule her follow up colonoscopy.  Referral to GI.  - Ambulatory referral to Gastroenterology 4. COPD Patient has quit smoking many years ago so hopefully this URI will resolve without further intervention. 5. Osteoarthritis 6. Chronic DDD  I have done the exam and reviewed the above chart and it is accurate to the best of my knowledge.  Miguel Aschoff MD [facility]  05/16/2015 11:03 AM

## 2015-06-01 ENCOUNTER — Other Ambulatory Visit: Payer: Self-pay | Admitting: Family Medicine

## 2015-06-13 DIAGNOSIS — Z86010 Personal history of colonic polyps: Secondary | ICD-10-CM | POA: Diagnosis not present

## 2015-06-13 DIAGNOSIS — Z800 Family history of malignant neoplasm of digestive organs: Secondary | ICD-10-CM | POA: Diagnosis not present

## 2015-06-30 ENCOUNTER — Ambulatory Visit
Admission: RE | Admit: 2015-06-30 | Discharge: 2015-06-30 | Disposition: A | Discharge status: Home / Self Care | Payer: Medicare Other | Source: Ambulatory Visit | Attending: Gastroenterology | Admitting: Gastroenterology

## 2015-06-30 ENCOUNTER — Encounter
Admission: RE | Disposition: A | Discharge status: Home / Self Care | Payer: Self-pay | Source: Ambulatory Visit | Attending: Gastroenterology

## 2015-06-30 ENCOUNTER — Ambulatory Visit: Payer: Medicare Other | Admitting: Anesthesiology

## 2015-06-30 ENCOUNTER — Encounter: Payer: Self-pay | Admitting: *Deleted

## 2015-06-30 DIAGNOSIS — J449 Chronic obstructive pulmonary disease, unspecified: Secondary | ICD-10-CM | POA: Insufficient documentation

## 2015-06-30 DIAGNOSIS — D122 Benign neoplasm of ascending colon: Secondary | ICD-10-CM | POA: Diagnosis not present

## 2015-06-30 DIAGNOSIS — D120 Benign neoplasm of cecum: Secondary | ICD-10-CM | POA: Diagnosis not present

## 2015-06-30 DIAGNOSIS — K5730 Diverticulosis of large intestine without perforation or abscess without bleeding: Secondary | ICD-10-CM | POA: Insufficient documentation

## 2015-06-30 DIAGNOSIS — Z79899 Other long term (current) drug therapy: Secondary | ICD-10-CM | POA: Diagnosis not present

## 2015-06-30 DIAGNOSIS — I10 Essential (primary) hypertension: Secondary | ICD-10-CM | POA: Diagnosis not present

## 2015-06-30 DIAGNOSIS — Z800 Family history of malignant neoplasm of digestive organs: Secondary | ICD-10-CM | POA: Diagnosis not present

## 2015-06-30 DIAGNOSIS — D124 Benign neoplasm of descending colon: Secondary | ICD-10-CM | POA: Diagnosis not present

## 2015-06-30 DIAGNOSIS — Z79891 Long term (current) use of opiate analgesic: Secondary | ICD-10-CM | POA: Insufficient documentation

## 2015-06-30 DIAGNOSIS — E039 Hypothyroidism, unspecified: Secondary | ICD-10-CM | POA: Insufficient documentation

## 2015-06-30 DIAGNOSIS — K5790 Diverticulosis of intestine, part unspecified, without perforation or abscess without bleeding: Secondary | ICD-10-CM | POA: Diagnosis not present

## 2015-06-30 DIAGNOSIS — M479 Spondylosis, unspecified: Secondary | ICD-10-CM | POA: Insufficient documentation

## 2015-06-30 DIAGNOSIS — Z8711 Personal history of peptic ulcer disease: Secondary | ICD-10-CM | POA: Diagnosis not present

## 2015-06-30 DIAGNOSIS — Z1211 Encounter for screening for malignant neoplasm of colon: Secondary | ICD-10-CM | POA: Insufficient documentation

## 2015-06-30 DIAGNOSIS — Z85828 Personal history of other malignant neoplasm of skin: Secondary | ICD-10-CM | POA: Diagnosis not present

## 2015-06-30 DIAGNOSIS — K640 First degree hemorrhoids: Secondary | ICD-10-CM | POA: Diagnosis not present

## 2015-06-30 DIAGNOSIS — E785 Hyperlipidemia, unspecified: Secondary | ICD-10-CM | POA: Diagnosis not present

## 2015-06-30 DIAGNOSIS — D123 Benign neoplasm of transverse colon: Secondary | ICD-10-CM | POA: Diagnosis not present

## 2015-06-30 DIAGNOSIS — M1990 Unspecified osteoarthritis, unspecified site: Secondary | ICD-10-CM | POA: Insufficient documentation

## 2015-06-30 DIAGNOSIS — D125 Benign neoplasm of sigmoid colon: Secondary | ICD-10-CM | POA: Diagnosis not present

## 2015-06-30 DIAGNOSIS — G709 Myoneural disorder, unspecified: Secondary | ICD-10-CM | POA: Insufficient documentation

## 2015-06-30 DIAGNOSIS — Z86010 Personal history of colonic polyps: Secondary | ICD-10-CM | POA: Diagnosis not present

## 2015-06-30 DIAGNOSIS — K635 Polyp of colon: Secondary | ICD-10-CM | POA: Diagnosis not present

## 2015-06-30 DIAGNOSIS — I739 Peripheral vascular disease, unspecified: Secondary | ICD-10-CM | POA: Insufficient documentation

## 2015-06-30 DIAGNOSIS — M4806 Spinal stenosis, lumbar region: Secondary | ICD-10-CM | POA: Diagnosis not present

## 2015-06-30 HISTORY — PX: COLONOSCOPY WITH PROPOFOL: SHX5780

## 2015-06-30 LAB — HM COLONOSCOPY

## 2015-06-30 LAB — SURGICAL PATHOLOGY

## 2015-06-30 SURGERY — COLONOSCOPY WITH PROPOFOL
Anesthesia: General

## 2015-06-30 MED ORDER — PROPOFOL 500 MG/50ML IV EMUL
INTRAVENOUS | Status: DC | PRN
  Administered 2015-06-30: 100 ug/kg/min via INTRAVENOUS

## 2015-06-30 MED ORDER — LIDOCAINE HCL (CARDIAC) 20 MG/ML IV SOLN
INTRAVENOUS | Status: DC | PRN
  Administered 2015-06-30: 100 mg via INTRAVENOUS

## 2015-06-30 MED ORDER — SODIUM CHLORIDE 0.9 % IV SOLN: INTRAVENOUS | Status: DC

## 2015-06-30 MED ORDER — CEFAZOLIN SODIUM 1-5 GM-% IV SOLN
1.0000 g | Freq: Once | INTRAVENOUS | Status: AC
  Administered 2015-06-30: 1 g via INTRAVENOUS
  Filled 2015-06-30: qty 50

## 2015-06-30 MED ORDER — PROPOFOL 10 MG/ML IV BOLUS
INTRAVENOUS | Status: DC | PRN
  Administered 2015-06-30: 100 mg via INTRAVENOUS

## 2015-06-30 MED ORDER — SODIUM CHLORIDE 0.9 % IV SOLN
INTRAVENOUS | Status: DC
  Administered 2015-06-30: 14:00:00 via INTRAVENOUS

## 2015-06-30 NOTE — Anesthesia Preprocedure Evaluation (Signed)
Anesthesia Evaluation  Patient identified by MRN, date of birth, ID band Patient awake    Reviewed: Allergy & Precautions, H&P , NPO status , Patient's Chart, lab work & pertinent test results, reviewed documented beta blocker date and time   History of Anesthesia Complications Negative for: history of anesthetic complications  Airway Mallampati: II  TM Distance: >3 FB Neck ROM: full    Dental no notable dental hx. (+) Implants, Caps, Teeth Intact Bridge on the bottom left.:   Pulmonary shortness of breath and with exertion, neg sleep apnea, COPD (mild), Recent URI , Resolved, former smoker,    Pulmonary exam normal breath sounds clear to auscultation       Cardiovascular Exercise Tolerance: Good hypertension, (-) angina+ Peripheral Vascular Disease  (-) CAD, (-) Past MI, (-) Cardiac Stents and (-) CABG Normal cardiovascular exam(-) dysrhythmias (-) Valvular Problems/Murmurs Rhythm:regular Rate:Normal     Neuro/Psych neg Seizures PSYCHIATRIC DISORDERS (Depression)  Neuromuscular disease    GI/Hepatic Neg liver ROS, PUD, GERD  ,  Endo/Other  neg diabetesHypothyroidism   Renal/GU negative Renal ROS  negative genitourinary   Musculoskeletal   Abdominal   Peds  Hematology negative hematology ROS (+)   Anesthesia Other Findings Past Medical History:   Hypertension                                                   Comment:PCP Dr Miguel Aschoff   [place]   Shortness of breath                                          Pneumonia                                                    Hypothyroidism                                               Peripheral vascular disease ([place])                              Comment:varicose veins left leg   Gastric ulcer                                   4/12           Comment:treated with Prolisec- states no problems now   Neuromuscular disorder (HCC)                                Comment:slight numbness right toes- comes and goes   Seasonal allergies                                           Arthritis  Comment:spinal stenosis   Hyperlipidemia                                               Skin cancer                                                    Comment:multiple from face   Reproductive/Obstetrics negative OB ROS                             Anesthesia Physical Anesthesia Plan  ASA: III  Anesthesia Plan: General   Post-op Pain Management:    Induction:   Airway Management Planned:   Additional Equipment:   Intra-op Plan:   Post-operative Plan:   Informed Consent: I have reviewed the patients History and Physical, chart, labs and discussed the procedure including the risks, benefits and alternatives for the proposed anesthesia with the patient or authorized representative who has indicated his/her understanding and acceptance.   Dental Advisory Given  Plan Discussed with: Anesthesiologist, CRNA and Surgeon  Anesthesia Plan Comments:         Anesthesia Quick Evaluation

## 2015-06-30 NOTE — H&P (Signed)
  Date of Initial H&P:06/13/2015  History reviewed, patient examined, no change in status, stable for surgery.

## 2015-06-30 NOTE — Op Note (Signed)
[facility] Gastroenterology Patient Name: Kathryn [name] Procedure Date: 06/30/2015 3:15 PM MRN: [id number] Account #: [id number] Date of Birth: [birth year] Admit Type: Outpatient Age: 82 Room: [facility] ENDO ROOM 4 Gender: Female Note Status: Finalized Procedure:            Colonoscopy Indications:          Personal history of colonic polyps Providers:            Lupita Dawn. Candace Cruise, MD Referring MD:         Janine Ores. Rosanna Randy, MD (Referring MD) Medicines:            Monitored Anesthesia Care Complications:        No immediate complications. Procedure:            Pre-Anesthesia Assessment:                       - Prior to the procedure, a History and Physical was                        performed, and patient medications, allergies and                        sensitivities were reviewed. The patient's tolerance of                        previous anesthesia was reviewed.                       - The risks and benefits of the procedure and the                        sedation options and risks were discussed with the                        patient. All questions were answered and informed                        consent was obtained.                       - After reviewing the risks and benefits, the patient                        was deemed in satisfactory condition to undergo the                        procedure.                       After obtaining informed consent, the colonoscope was                        passed under direct vision. Throughout the procedure,                        the patient's blood pressure, pulse, and oxygen                        saturations were monitored continuously. The  Colonoscope was introduced through the anus and                        advanced to the the cecum, identified by appendiceal                        orifice and ileocecal valve. The colonoscopy was                        performed without difficulty. The patient  tolerated the                        procedure well. The quality of the bowel preparation                        was good. Findings:      Nine sessile polyps were found in the sigmoid colon, descending colon,       transverse colon, ascending colon and cecum. The polyps were small in       size. These polyps were removed with a hot snare. Resection and       retrieval were complete. One polyp in cecum, 4 polyps in ascending       colon, 2 in transverse, 1 in decending colon, and 1 in sigmoid colon.      The exam was otherwise without abnormality.      Many small and large-mouthed diverticula were found in the sigmoid colon. Impression:           - Nine small polyps in the sigmoid colon, in the                        descending colon, in the transverse colon, in the                        ascending colon and in the cecum, removed with a hot                        snare. Resected and retrieved.                       - The examination was otherwise normal.                       - Diverticulosis in the sigmoid colon. Recommendation:       - Discharge patient to home.                       - Await pathology results.                       - Repeat colonoscopy in 1 year for surveillance based                        on pathology results.                       - The findings and recommendations were discussed with                        the patient.                       -  Discharge patient to home [Means]. Procedure Code(s):    --- Professional ---                       [phone], Colonoscopy, flexible; with removal of tumor(s),                        polyp(s), or other lesion(s) by snare technique Diagnosis Code(s):    --- Professional ---                       D12.5, Benign neoplasm of sigmoid colon                       D12.4, Benign neoplasm of descending colon                       D12.3, Benign neoplasm of transverse colon (hepatic                        flexure or splenic flexure)                        D12.2, Benign neoplasm of ascending colon                       D12.0, Benign neoplasm of cecum                       Z86.010, Personal history of colonic polyps CPT copyright 2016 American Medical Association. All rights reserved. The codes documented in this report are preliminary and upon coder review may  be revised to meet current compliance requirements. Hulen Luster, MD 06/30/2015 3:46:06 PM This report has been signed electronically. Number of Addenda: 0 Note Initiated On: 06/30/2015 3:15 PM Scope Withdrawal Time: 0 hours 13 minutes 25 seconds  Total Procedure Duration: 0 hours 19 minutes 13 seconds       [facility]

## 2015-06-30 NOTE — Transfer of Care (Signed)
Immediate Anesthesia Transfer of Care Note  Patient: Kathryn [name]  Procedure(s) Performed: Procedure(s): COLONOSCOPY WITH PROPOFOL (N/A)  Patient Location: PACU  Anesthesia Type:General  Level of Consciousness: awake, alert , oriented and patient cooperative  Airway & Oxygen Therapy: Patient Spontanous Breathing and Patient connected to nasal cannula oxygen  Post-op Assessment: Report given to RN and Post -op Vital signs reviewed and stable  Post vital signs: Reviewed and stable  Last Vitals:  Filed Vitals:   06/30/15 1405  BP: 136/82  Pulse: 98  Temp: 36.4 C  Resp: 22    Complications: No apparent anesthesia complications

## 2015-06-30 NOTE — Anesthesia Postprocedure Evaluation (Signed)
Anesthesia Post Note  Patient: Kathryn [name]  Procedure(s) Performed: Procedure(s) (LRB): COLONOSCOPY WITH PROPOFOL (N/A)  Patient location during evaluation: PACU Anesthesia Type: General Level of consciousness: awake and alert Pain management: pain level controlled Vital Signs Assessment: post-procedure vital signs reviewed and stable Respiratory status: spontaneous breathing, nonlabored ventilation, respiratory function stable and patient connected to nasal cannula oxygen Cardiovascular status: blood pressure returned to baseline and stable Postop Assessment: no signs of nausea or vomiting Anesthetic complications: no    Last Vitals:  Filed Vitals:   06/30/15 1607 06/30/15 1617  BP: 131/100 98/79  Pulse: 83 80  Temp:    Resp: 24 17    Last Pain: There were no vitals filed for this visit.               Molli Barrows

## 2015-07-04 ENCOUNTER — Other Ambulatory Visit: Payer: Self-pay | Admitting: Family Medicine

## 2015-07-05 ENCOUNTER — Encounter: Payer: Self-pay | Admitting: Gastroenterology

## 2015-07-05 ENCOUNTER — Other Ambulatory Visit: Payer: Self-pay | Admitting: Emergency Medicine

## 2015-07-05 MED ORDER — LEVOTHYROXINE SODIUM 100 MCG PO TABS: 100.0000 ug | ORAL_TABLET | Freq: Every day | ORAL | Status: DC

## 2015-08-23 ENCOUNTER — Ambulatory Visit (INDEPENDENT_AMBULATORY_CARE_PROVIDER_SITE_OTHER): Payer: Medicare Other | Admitting: Family Medicine

## 2015-08-23 VITALS — BP 132/66 | HR 76 | Temp 97.7°F | Resp 16 | Wt 200.0 lb

## 2015-08-23 DIAGNOSIS — M1990 Unspecified osteoarthritis, unspecified site: Secondary | ICD-10-CM

## 2015-08-23 DIAGNOSIS — J449 Chronic obstructive pulmonary disease, unspecified: Secondary | ICD-10-CM | POA: Diagnosis not present

## 2015-08-23 DIAGNOSIS — D509 Iron deficiency anemia, unspecified: Secondary | ICD-10-CM | POA: Diagnosis not present

## 2015-08-23 DIAGNOSIS — E785 Hyperlipidemia, unspecified: Secondary | ICD-10-CM

## 2015-08-23 DIAGNOSIS — E039 Hypothyroidism, unspecified: Secondary | ICD-10-CM | POA: Diagnosis not present

## 2015-08-23 DIAGNOSIS — I10 Essential (primary) hypertension: Secondary | ICD-10-CM | POA: Diagnosis not present

## 2015-08-23 NOTE — Progress Notes (Signed)
Patient ID: Kathryn [name], female   DOB: Aug 06, 1933, 82 y.o.   MRN: [id number]   Kathryn [name]  MRN: [id number] DOB: 01-Jan-1934  Subjective:  HPI   The patient is a 82 year old female who presents for follow up of her chronic issues; COPD, OA, HTN, and Chronic DDD.  She is also due for her labs to be done.  She states her COPD has been stable.  Her blood pressure has been stable and she has not been taking it outside the office.  She has had increased body pain.   She does have questions about her Cymbalta.  She was put on this for her back pain. She has started weaning herself off of this and is only taking it every third day.  She did not get any relief from the Cymbalta.  It is also of note that the patient is taking 9-10 extra strength Tylenol per day. Emotionally pt feels well.and she is doing well other than her chronic pain. Since she quit smoking her breathing has improved. Patient Active Problem List   Diagnosis Date Noted  . Absolute anemia 09/17/2014  . COPD, mild ([place]) 09/17/2014  . Clinical depression 09/17/2014  . Elevated platelet count ([place]) 09/17/2014  . Elevated erythrocyte sedimentation rate 09/17/2014  . History of tobacco use 09/17/2014  . Hypercholesteremia 09/17/2014  . BP (high blood pressure) 09/17/2014  . Adult hypothyroidism 09/17/2014  . Cannot sleep 09/17/2014  . Anemia, iron deficiency 09/17/2014  . Lumbar disc disease with radiculopathy 09/17/2014  . Arthritis, degenerative 09/17/2014  . Excess weight 09/17/2014  . Vertebrobasilar circulation transient ischemic attack 09/17/2014  . Lung nodule, multiple 09/17/2014  . Basilar artery insufficiency 09/17/2014  . Neuritis or radiculitis due to rupture of lumbar intervertebral disc 08/25/2013  . Degenerative arthritis of lumbar spine 08/25/2013  . Lumbar canal stenosis 08/25/2013  . Smoker 07/15/2012  . Back pain 07/15/2012  . SOB (shortness of breath) 07/15/2012  . Hyperlipidemia 07/15/2012  .  Essential hypertension 07/15/2012    Past Medical History  Diagnosis Date  . Hypertension     PCP Dr Miguel Aschoff   [place]  . Shortness of breath   . Pneumonia   . Hypothyroidism   . Peripheral vascular disease (HCC)     varicose veins left leg  . Gastric ulcer 4/12    treated with Prolisec- states no problems now  . Neuromuscular disorder ([place])     slight numbness right toes- comes and goes  . Seasonal allergies   . Arthritis     spinal stenosis  . Hyperlipidemia   . Skin cancer     multiple from face    Social History   Social History  . Marital Status: Widowed    Spouse Name: N/A  . Number of Children: N/A  . Years of Education: N/A   Occupational History  . Not on file.   Social History Main Topics  . Smoking status: Former Smoker -- 0.50 packs/day for 50 years    Types: Cigarettes    Quit date: 05/16/2004  . Smokeless tobacco: Never Used  . Alcohol Use: Yes     Comment: 2-3 glasses wine week  . Drug Use: No  . Sexual Activity: Yes    Birth Control/ Protection: None   Other Topics Concern  . Not on file   Social History Narrative    Outpatient Prescriptions Prior to Visit  Medication Sig Dispense Refill  . acetaminophen (TYLENOL) 500 MG tablet Take 1,000  mg by mouth every 6 (six) hours as needed. Pain     . amLODipine (NORVASC) 5 MG tablet TAKE 1 TABLET BY MOUTH EVERY DAY FOR HYPERTENSION 30 tablet 5  . atorvastatin (LIPITOR) 40 MG tablet TAKE 1 TABLET BY MOUTH EVERY DAY 30 tablet 12  . cetirizine (ZYRTEC) 10 MG tablet Take 10 mg by mouth at bedtime.     . DULoxetine (CYMBALTA) 30 MG capsule Take by mouth.    Marland Kitchen HYDROcodone-acetaminophen (NORCO/VICODIN) 5-325 MG per tablet Take 1 tablet by mouth every 4 (four) hours as needed for moderate pain. 100 tablet 0  . hydrOXYzine (ATARAX/VISTARIL) 10 MG tablet TAKE 1 TO 2 TABLETS BY MOUTH EVERY 6 HOURS AS NEEDED 100 tablet 12  . levothyroxine (SYNTHROID, LEVOTHROID) 100 MCG tablet Take 1 tablet (100 mcg  total) by mouth daily before breakfast. 30 tablet 12  . lisinopril (PRINIVIL,ZESTRIL) 10 MG tablet TAKE 1 TABLET BY MOUTH EVERY NIGHT AT BEDTIME FOR HYPERTENSION 30 tablet 12  . omeprazole (PRILOSEC) 20 MG capsule Take 20 mg by mouth daily.    Marland Kitchen tiotropium (SPIRIVA HANDIHALER) 18 MCG inhalation capsule Place into inhaler and inhale. Reported on 08/23/2015    . HYDROcodone-homatropine (HYCODAN) 5-1.5 MG/5ML syrup Take 5 mLs by mouth every 6 (six) hours as needed for cough. 120 mL 0  . oxymetazoline (AFRIN) 0.05 % nasal spray Place 1 spray into the nose at bedtime.     No facility-administered medications prior to visit.    Allergies  Allergen Reactions  . Oxycodone Other (See Comments)    Mental status change  . Prednisone     swelling, bruising.    Review of Systems  Constitutional: Negative for fever and malaise/fatigue.  Respiratory: Negative for cough, shortness of breath and wheezing.   Cardiovascular: Negative for chest pain, palpitations, orthopnea, claudication, leg swelling and PND.  Gastrointestinal: Negative for heartburn, abdominal pain and blood in stool.  Musculoskeletal: Positive for myalgias, back pain, joint pain and neck pain. Negative for falls.  Neurological: Negative for dizziness, weakness and headaches.   Objective:  BP 132/66 mmHg  Pulse 76  Temp(Src) 97.7 F (36.5 C) (Oral)  Resp 16  Wt 200 lb (90.719 kg)  Physical Exam  Constitutional: She is oriented to person, place, and time and well-developed, well-nourished, and in no distress.  HENT:  Head: Normocephalic and atraumatic.  Right Ear: External ear normal.  Left Ear: External ear normal.  Nose: Nose normal.  Eyes: Conjunctivae are normal. Pupils are equal, round, and reactive to light.  Neck: Normal range of motion.  Cardiovascular: Normal rate, regular rhythm and normal heart sounds.   Pulmonary/Chest: Effort normal and breath sounds normal.  Abdominal: Soft.  Neurological: She is alert and  oriented to person, place, and time.  Skin: Skin is warm and dry.  Psychiatric: Mood, memory, affect and judgment normal.    Assessment and Plan :   1. Essential hypertension  - Comprehensive metabolic panel  2. COPD, mild ([place])   3. Hypothyroidism, unspecified hypothyroidism type  - TSH  4. Hyperlipidemia  - Lipid Panel With LDL/HDL Ratio  5. Anemia, iron deficiency  - CBC with Differential/Platelet  6. Osteoarthritis, unspecified osteoarthritis type, unspecified site Patient is to decrease her Tylenol intake and limit it to 6 per day. More than 50% of visit in counselling. Miguel Aschoff MD [facility] 08/23/2015 11:50 AM

## 2015-08-24 DIAGNOSIS — E785 Hyperlipidemia, unspecified: Secondary | ICD-10-CM | POA: Diagnosis not present

## 2015-08-24 DIAGNOSIS — E039 Hypothyroidism, unspecified: Secondary | ICD-10-CM | POA: Diagnosis not present

## 2015-08-24 DIAGNOSIS — D509 Iron deficiency anemia, unspecified: Secondary | ICD-10-CM | POA: Diagnosis not present

## 2015-08-24 DIAGNOSIS — I10 Essential (primary) hypertension: Secondary | ICD-10-CM | POA: Diagnosis not present

## 2015-08-25 LAB — COMPREHENSIVE METABOLIC PANEL
ALT: 17 IU/L (ref 0–32)
AST: 17 IU/L (ref 0–40)
Albumin/Globulin Ratio: 1.5 (ref 1.2–2.2)
Albumin: 4.4 g/dL (ref 3.5–4.8)
Alkaline Phosphatase: 99 IU/L (ref 39–117)
BUN/Creatinine Ratio: 22 (ref 12–28)
BUN: 19 mg/dL (ref 8–27)
Bilirubin Total: 0.3 mg/dL (ref 0.0–1.2)
CO2: 22 mmol/L (ref 18–29)
Calcium: 10.2 mg/dL (ref 8.7–10.3)
Chloride: 99 mmol/L (ref 96–106)
Creatinine, Ser: 0.87 mg/dL (ref 0.57–1.00)
GFR calc Af Amer: 73 mL/min/{1.73_m2} (ref >59)
GFR calc non Af Amer: 64 mL/min/{1.73_m2} (ref >59)
Globulin, Total: 2.9 g/dL (ref 1.5–4.5)
Glucose: 183 mg/dL — ABNORMAL HIGH (ref 65–99)
Potassium: 5.5 mmol/L — ABNORMAL HIGH (ref 3.5–5.2)
Sodium: 139 mmol/L (ref 134–144)
Total Protein: 7.3 g/dL (ref 6.0–8.5)

## 2015-08-25 LAB — LIPID PANEL WITH LDL/HDL RATIO
Cholesterol, Total: 197 mg/dL (ref 100–199)
HDL: 56 mg/dL (ref >39)
LDL Calculated: 99 mg/dL (ref 0–99)
LDl/HDL Ratio: 1.8 ratio units (ref 0.0–3.2)
Triglycerides: 211 mg/dL — ABNORMAL HIGH (ref 0–149)
VLDL Cholesterol Cal: 42 mg/dL — ABNORMAL HIGH (ref 5–40)

## 2015-08-25 LAB — CBC WITH DIFFERENTIAL/PLATELET
Basophils Absolute: 0 10*3/uL (ref 0.0–0.2)
Basos: 1 %
EOS (ABSOLUTE): 0.2 10*3/uL (ref 0.0–0.4)
Eos: 4 %
Hematocrit: 39.9 % (ref 34.0–46.6)
Hemoglobin: 13.2 g/dL (ref 11.1–15.9)
Immature Grans (Abs): 0 10*3/uL (ref 0.0–0.1)
Immature Granulocytes: 0 %
Lymphocytes Absolute: 2.1 10*3/uL (ref 0.7–3.1)
Lymphs: 39 %
MCH: 28.2 pg (ref 26.6–33.0)
MCHC: 33.1 g/dL (ref 31.5–35.7)
MCV: 85 fL (ref 79–97)
Monocytes Absolute: 0.6 10*3/uL (ref 0.1–0.9)
Monocytes: 11 %
Neutrophils Absolute: 2.4 10*3/uL (ref 1.4–7.0)
Neutrophils: 45 %
Platelets: 321 10*3/uL (ref 150–379)
RBC: 4.68 x10E6/uL (ref 3.77–5.28)
RDW: 14.4 % (ref 12.3–15.4)
WBC: 5.3 10*3/uL (ref 3.4–10.8)

## 2015-08-25 LAB — TSH: TSH: 1.65 u[IU]/mL (ref 0.450–4.500)

## 2015-10-17 DIAGNOSIS — H26492 Other secondary cataract, left eye: Secondary | ICD-10-CM | POA: Diagnosis not present

## 2015-10-17 DIAGNOSIS — H04129 Dry eye syndrome of unspecified lacrimal gland: Secondary | ICD-10-CM | POA: Diagnosis not present

## 2015-10-17 DIAGNOSIS — Z961 Presence of intraocular lens: Secondary | ICD-10-CM | POA: Diagnosis not present

## 2015-11-03 DIAGNOSIS — Z96642 Presence of left artificial hip joint: Secondary | ICD-10-CM | POA: Diagnosis not present

## 2015-11-03 DIAGNOSIS — M898X9 Other specified disorders of bone, unspecified site: Secondary | ICD-10-CM | POA: Diagnosis not present

## 2015-11-03 DIAGNOSIS — M25552 Pain in left hip: Secondary | ICD-10-CM | POA: Diagnosis not present

## 2015-11-13 ENCOUNTER — Other Ambulatory Visit: Payer: Self-pay | Admitting: Family Medicine

## 2015-12-13 DIAGNOSIS — Z23 Encounter for immunization: Secondary | ICD-10-CM | POA: Diagnosis not present

## 2016-01-03 ENCOUNTER — Other Ambulatory Visit: Payer: Self-pay | Admitting: Family Medicine

## 2016-01-03 DIAGNOSIS — Z1231 Encounter for screening mammogram for malignant neoplasm of breast: Secondary | ICD-10-CM

## 2016-01-06 ENCOUNTER — Telehealth: Payer: Self-pay | Admitting: Family Medicine

## 2016-01-13 ENCOUNTER — Other Ambulatory Visit: Payer: Self-pay | Admitting: Family Medicine

## 2016-01-16 NOTE — Telephone Encounter (Signed)
Has appointment 02/07/2016. Renaldo Fiddler, CMA

## 2016-01-18 DIAGNOSIS — Z96652 Presence of left artificial knee joint: Secondary | ICD-10-CM | POA: Diagnosis not present

## 2016-01-23 NOTE — Telephone Encounter (Signed)
Called Pt to schedule pneumonia shot for 11/14- knb

## 2016-02-06 ENCOUNTER — Ambulatory Visit: Payer: Medicare Other

## 2016-02-07 ENCOUNTER — Ambulatory Visit (INDEPENDENT_AMBULATORY_CARE_PROVIDER_SITE_OTHER): Payer: Medicare Other | Admitting: Family Medicine

## 2016-02-07 ENCOUNTER — Encounter: Payer: Self-pay | Admitting: Family Medicine

## 2016-02-07 VITALS — BP 142/78 | HR 80 | Resp 18 | Wt 203.0 lb

## 2016-02-07 DIAGNOSIS — J449 Chronic obstructive pulmonary disease, unspecified: Secondary | ICD-10-CM | POA: Diagnosis not present

## 2016-02-07 DIAGNOSIS — I10 Essential (primary) hypertension: Secondary | ICD-10-CM | POA: Diagnosis not present

## 2016-02-07 DIAGNOSIS — E7849 Other hyperlipidemia: Secondary | ICD-10-CM

## 2016-02-07 DIAGNOSIS — R739 Hyperglycemia, unspecified: Secondary | ICD-10-CM | POA: Diagnosis not present

## 2016-02-07 DIAGNOSIS — M1990 Unspecified osteoarthritis, unspecified site: Secondary | ICD-10-CM | POA: Diagnosis not present

## 2016-02-07 DIAGNOSIS — E784 Other hyperlipidemia: Secondary | ICD-10-CM | POA: Diagnosis not present

## 2016-02-07 MED ORDER — HYDROCODONE-ACETAMINOPHEN 5-325 MG PO TABS: 1.0000 | ORAL_TABLET | ORAL | 0 refills | Status: DC | PRN

## 2016-02-07 MED ORDER — ALBUTEROL SULFATE (2.5 MG/3ML) 0.083% IN NEBU: 2.5000 mg | INHALATION_SOLUTION | Freq: Four times a day (QID) | RESPIRATORY_TRACT | 12 refills | Status: DC | PRN

## 2016-02-07 MED ORDER — DULOXETINE HCL 20 MG PO CPEP: 20.0000 mg | ORAL_CAPSULE | Freq: Every day | ORAL | 12 refills | Status: DC

## 2016-02-07 NOTE — Progress Notes (Signed)
Kathryn [name]  MRN: [id number] DOB: 06-Nov-1933  Subjective:  HPI  Patient is here for 6 months follow up. Last lab work was done on 08/24/15 and was stable. Her chronic pain has been "up and down." Takes Tylenol about 6 tablets daily on a regular day. She is back on Cymbalta. She thinks this has helped her pain but would like to decrease the dose of the medication. She has bad dreams and feels out of it some with medication. She went to Dr Rudene Christians about 2 to 3 weeks ago due to severe pain in her leg and discuss this issue. Patient has Norco that she was given in 2016 100 tablets and has 1 tablet left. She does not check her blood pressure at [facility] or pharmacy. Denies any cardiac symptoms. COPD symptoms have been stable and occasionally she will use Albuterol nebulizer but not often. Patient Active Problem List   Diagnosis Date Noted  . Absolute anemia 09/17/2014  . COPD, mild ([place]) 09/17/2014  . Clinical depression 09/17/2014  . Elevated platelet count ([place]) 09/17/2014  . Elevated erythrocyte sedimentation rate 09/17/2014  . History of tobacco use 09/17/2014  . Hypercholesteremia 09/17/2014  . BP (high blood pressure) 09/17/2014  . Adult hypothyroidism 09/17/2014  . Cannot sleep 09/17/2014  . Anemia, iron deficiency 09/17/2014  . Lumbar disc disease with radiculopathy 09/17/2014  . Arthritis, degenerative 09/17/2014  . Excess weight 09/17/2014  . Vertebrobasilar circulation transient ischemic attack 09/17/2014  . Lung nodule, multiple 09/17/2014  . Basilar artery insufficiency 09/17/2014  . Neuritis or radiculitis due to rupture of lumbar intervertebral disc 08/25/2013  . Degenerative arthritis of lumbar spine 08/25/2013  . Lumbar canal stenosis 08/25/2013  . Smoker 07/15/2012  . Back pain 07/15/2012  . SOB (shortness of breath) 07/15/2012  . Hyperlipidemia 07/15/2012  . Essential hypertension 07/15/2012    Past Medical History:  Diagnosis Date  . Arthritis    spinal  stenosis  . Gastric ulcer 4/12   treated with Prolisec- states no problems now  . Hyperlipidemia   . Hypertension    PCP Dr Miguel Aschoff   [place]  . Hypothyroidism   . Neuromuscular disorder ([place])    slight numbness right toes- comes and goes  . Peripheral vascular disease (HCC)    varicose veins left leg  . Pneumonia   . Seasonal allergies   . Shortness of breath   . Skin cancer    multiple from face    Social History   Social History  . Marital status: Widowed    Spouse name: N/A  . Number of children: N/A  . Years of education: N/A   Occupational History  . Not on file.   Social History Main Topics  . Smoking status: Former Smoker    Packs/day: 0.50    Years: 50.00    Types: Cigarettes    Quit date: 05/16/2004  . Smokeless tobacco: Never Used  . Alcohol use Yes     Comment: 2-3 glasses wine week  . Drug use: No  . Sexual activity: Yes    Birth control/ protection: None   Other Topics Concern  . Not on file   Social History Narrative  . No narrative on file    Outpatient Encounter Prescriptions as of 02/07/2016  Medication Sig Note  . acetaminophen (TYLENOL) 500 MG tablet Take 1,000 mg by mouth every 6 (six) hours as needed. Pain    . amLODipine (NORVASC) 5 MG tablet TAKE 1 TABLET BY MOUTH EVERY  DAY FOR HYPERTENSION   . atorvastatin (LIPITOR) 40 MG tablet TAKE 1 TABLET BY MOUTH EVERY DAY   . DULoxetine (CYMBALTA) 30 MG capsule Take by mouth. 09/17/2014: Received from: [facility]  . HYDROcodone-acetaminophen (NORCO/VICODIN) 5-325 MG per tablet Take 1 tablet by mouth every 4 (four) hours as needed for moderate pain. 02/07/2016: prn  . hydrOXYzine (ATARAX/VISTARIL) 10 MG tablet TAKE 1 TO 2 TABLETS BY MOUTH EVERY 6 HOURS AS NEEDED 02/07/2016: prn  . levothyroxine (SYNTHROID, LEVOTHROID) 100 MCG tablet Take 1 tablet (100 mcg total) by mouth daily before breakfast.   . lisinopril (PRINIVIL,ZESTRIL) 10 MG tablet TAKE 1 TABLET BY MOUTH  EVERY NIGHT AT BEDTIME FOR HYPERTENSION   . omeprazole (PRILOSEC) 20 MG capsule Take 20 mg by mouth daily.   . cetirizine (ZYRTEC) 10 MG tablet Take 10 mg by mouth at bedtime.  02/07/2016: prn  . [DISCONTINUED] tiotropium (SPIRIVA HANDIHALER) 18 MCG inhalation capsule Place into inhaler and inhale. Reported on 08/23/2015 09/17/2014: Received from: [facility]   No facility-administered encounter medications on file as of 02/07/2016.     Allergies  Allergen Reactions  . Oxycodone Other (See Comments)    Mental status change  . Prednisone     swelling, bruising.    Review of Systems  Constitutional: Negative.   Eyes: Negative.   Respiratory: Positive for shortness of breath.   Cardiovascular: Negative.   Gastrointestinal: Negative.   Musculoskeletal: Positive for back pain, joint pain and myalgias.       Muscle spasms in her back  Skin:       Chronic hives off and on in her feet mainly  Neurological: Negative.   Psychiatric/Behavioral: Negative.     Objective:  BP (!) 142/78   Pulse 80   Resp 18   Wt 203 lb (92.1 kg)   BMI 34.84 kg/m   Physical Exam  Constitutional: She is oriented to person, place, and time and well-developed, well-nourished, and in no distress.  HENT:  Head: Normocephalic and atraumatic.  Right Ear: External ear normal.  Left Ear: External ear normal.  Nose: Nose normal.  Eyes: Conjunctivae are normal. Pupils are equal, round, and reactive to light.  Neck: Normal range of motion. Neck supple.  Cardiovascular: Normal rate, regular rhythm, normal heart sounds and intact distal pulses.   No murmur heard. Pulmonary/Chest: Effort normal and breath sounds normal. No respiratory distress. She has no wheezes.  Abdominal: Soft.  Musculoskeletal: She exhibits no edema or tenderness.  Neurological: She is alert and oriented to person, place, and time. No cranial nerve deficit.  Skin: Skin is warm and dry.  Psychiatric: Mood, memory, affect  and judgment normal.    Assessment and Plan :  1. Essential hypertension Stable. Continue current medication  2. Osteoarthritis, unspecified osteoarthritis type, unspecified site Refill norco today. Last refill was 2016 for 100 tablets. Rare use. Follow  3. COPD, mild (HCC) Stable, Albuterol nebulizer refilled to have on hand for as needed use.  4. Other hyperlipidemia 5. Hyperglycemia Will check A1C on next visit. 6.MDD/Mild Controlled.Due to possible SE pt would like to decrease dose so will go from 30 to 20mg  daily.RTC 2-3 months. 7.Chronic Pain 8.DDD HPI, Exam and A&P transcribed under direction and in the presence of Miguel Aschoff, MD. I have done the exam and reviewed the chart and it is accurate to the best of my knowledge. Development worker, community has been used and  any errors in dictation or transcription are unintentional. Miguel Aschoff  M.D. [facility]

## 2016-03-05 DIAGNOSIS — M47816 Spondylosis without myelopathy or radiculopathy, lumbar region: Secondary | ICD-10-CM | POA: Diagnosis not present

## 2016-03-05 DIAGNOSIS — M48062 Spinal stenosis, lumbar region with neurogenic claudication: Secondary | ICD-10-CM | POA: Diagnosis not present

## 2016-03-05 DIAGNOSIS — M5416 Radiculopathy, lumbar region: Secondary | ICD-10-CM | POA: Diagnosis not present

## 2016-03-05 DIAGNOSIS — M5136 Other intervertebral disc degeneration, lumbar region: Secondary | ICD-10-CM | POA: Diagnosis not present

## 2016-03-07 ENCOUNTER — Ambulatory Visit
Admission: RE | Admit: 2016-03-07 | Discharge: 2016-03-07 | Disposition: A | Discharge status: Home / Self Care | Payer: Medicare Other | Source: Ambulatory Visit | Attending: Family Medicine | Admitting: Family Medicine

## 2016-03-07 DIAGNOSIS — Z1231 Encounter for screening mammogram for malignant neoplasm of breast: Secondary | ICD-10-CM | POA: Diagnosis not present

## 2016-03-19 IMAGING — CR DG ABDOMEN 1V
1 series · 1 of 1 positions shown · non-contrast
Comparison: None.

CLINICAL DATA: Colonoscopy 5 days ago. Bright red blood per rectum.
Initial encounter.

EXAM:
ABDOMEN - 1 VIEW

[ap]
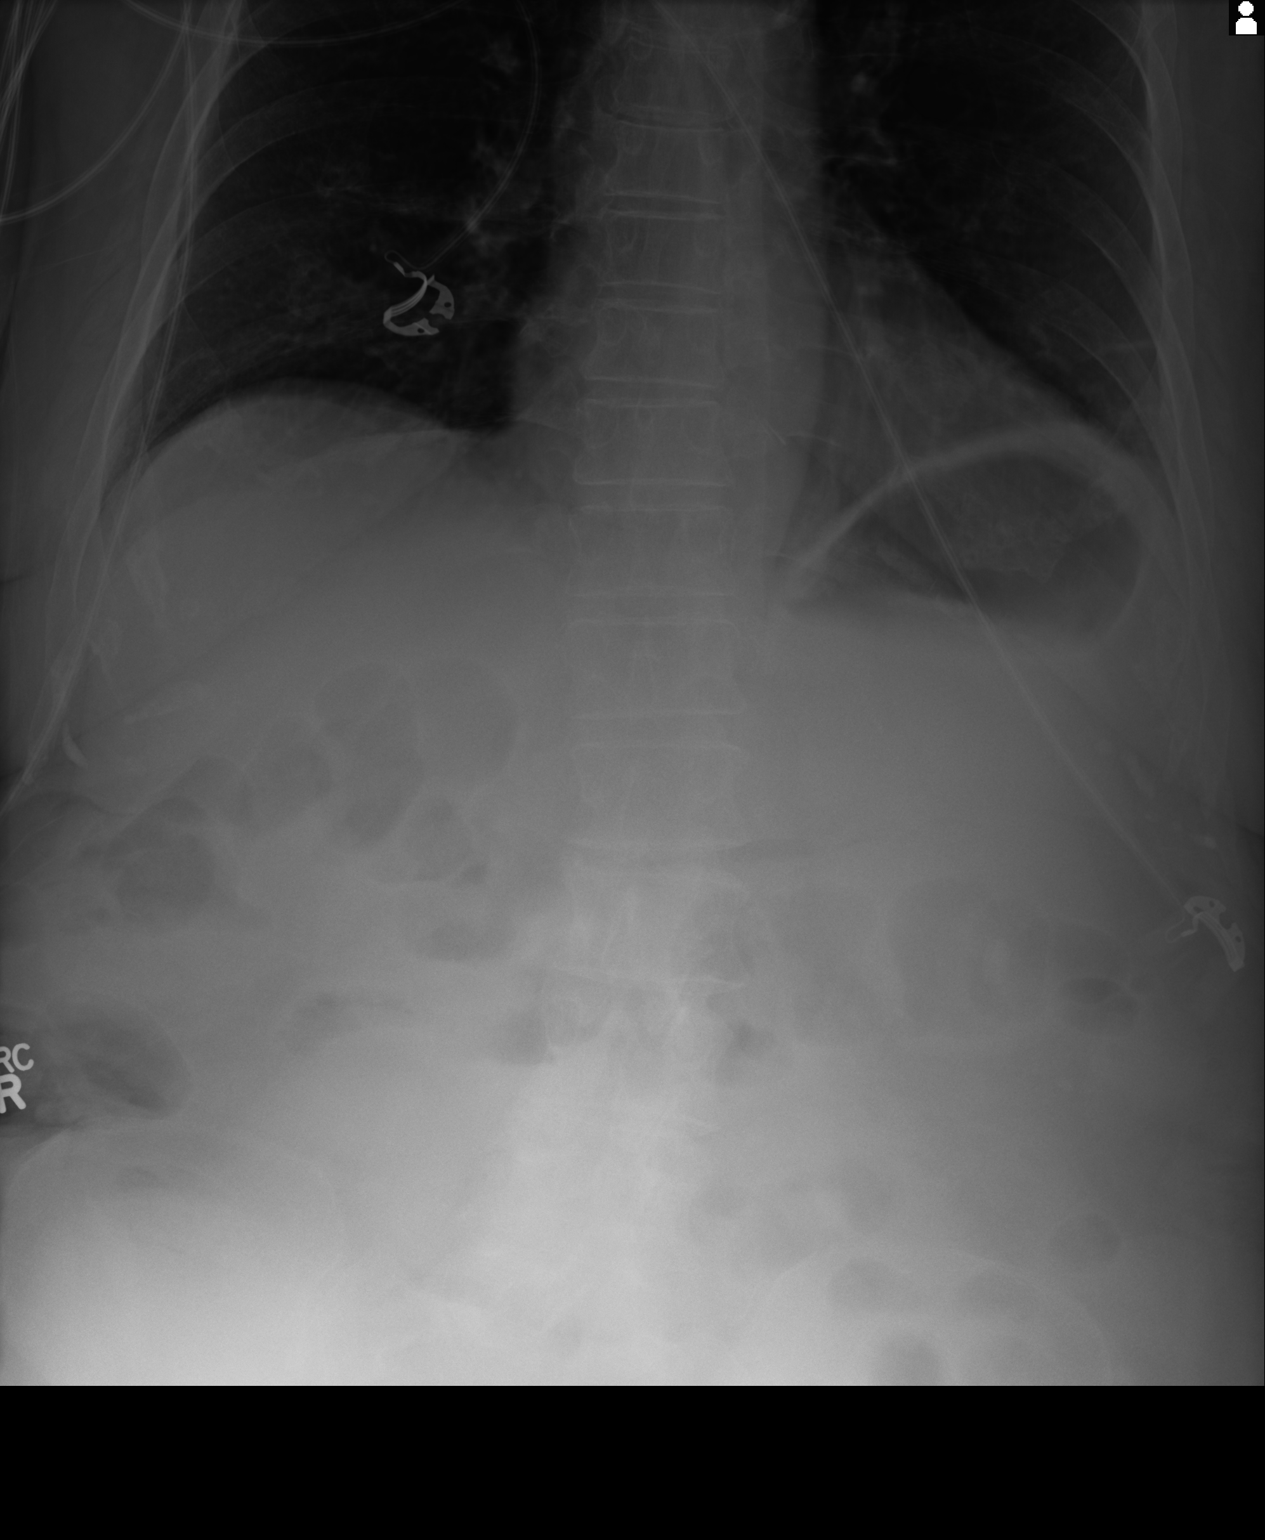

[1 of 1 positions shown; findings below may reference images not displayed]

FINDINGS: The visualized bowel gas pattern is unremarkable. Scattered air and
stool filled loops of colon are seen; no abnormal dilatation of
small bowel loops is seen to suggest small bowel obstruction. No
free intra-abdominal air is identified, though evaluation for free
air is limited on a single supine view. Air and fluid are seen
partially filling the stomach.

The visualized osseous structures are within normal limits; the
sacroiliac joints are unremarkable in appearance. Minimal left
basilar atelectasis is noted.
IMPRESSION: Unremarkable bowel gas pattern; no free intra-abdominal air seen.
Air noted partially filling the colon; no radiographic evidence of
constipation.

## 2016-03-20 DIAGNOSIS — M5416 Radiculopathy, lumbar region: Secondary | ICD-10-CM | POA: Diagnosis not present

## 2016-03-20 DIAGNOSIS — M5136 Other intervertebral disc degeneration, lumbar region: Secondary | ICD-10-CM | POA: Diagnosis not present

## 2016-03-20 DIAGNOSIS — M48062 Spinal stenosis, lumbar region with neurogenic claudication: Secondary | ICD-10-CM | POA: Diagnosis not present

## 2016-04-20 ENCOUNTER — Encounter: Payer: Self-pay | Admitting: Family Medicine

## 2016-04-20 ENCOUNTER — Ambulatory Visit (INDEPENDENT_AMBULATORY_CARE_PROVIDER_SITE_OTHER): Payer: Medicare Other | Admitting: Family Medicine

## 2016-04-20 VITALS — BP 120/60 | HR 97 | Temp 97.9°F | Resp 17 | Wt 207.0 lb

## 2016-04-20 DIAGNOSIS — B9789 Other viral agents as the cause of diseases classified elsewhere: Secondary | ICD-10-CM

## 2016-04-20 DIAGNOSIS — J069 Acute upper respiratory infection, unspecified: Secondary | ICD-10-CM

## 2016-04-20 MED ORDER — HYDROCODONE-HOMATROPINE 5-1.5 MG/5ML PO SYRP: ORAL_SOLUTION | ORAL | 0 refills | Status: DC

## 2016-04-20 MED ORDER — ALBUTEROL SULFATE (2.5 MG/3ML) 0.083% IN NEBU: 2.5000 mg | INHALATION_SOLUTION | Freq: Four times a day (QID) | RESPIRATORY_TRACT | 12 refills | Status: DC | PRN

## 2016-04-20 NOTE — Patient Instructions (Signed)
Schedule the albuterol nebulizer twice daily or more as needed while ill. Call if increased shortness of breath or cough not improving over the next week.

## 2016-04-20 NOTE — Progress Notes (Signed)
Subjective:     Patient ID: Kathryn [name], female   DOB: Nov 07, 1933, 83 y.o.   MRN: [id number]  HPI  Chief Complaint  Patient presents with  . Cough    Patient comes in office today with concerns of cough and congestion for 1 week. Patient states she has shortness of breath, wheezing, chest soreness and fatigue. Patient reports on Monday 04/17/15, she has been taking Alka-Seltzer Cold   States she had transient sore throat and has mild clear sinus congestion. Has not felt chest congestion or shortness of breath. Accompanied by her sister today.   Review of Systems     Objective:   Physical Exam  Constitutional: She appears well-developed and well-nourished. No distress.  Ears: T.M's intact without inflammation Throat: no tonsillar enlargement or exudate Neck: no cervical adenopathy Lungs: clear     Assessment:    1. Viral upper respiratory tract infection - HYDROcodone-homatropine (HYCODAN) 5-1.5 MG/5ML syrup; 5 ml 4-6 hours as needed for cough  Dispense: 240 mL; Refill: 0 - albuterol (PROVENTIL) (2.5 MG/3ML) 0.083% nebulizer solution; Take 3 mLs (2.5 mg total) by nebulization every 6 (six) hours as needed for wheezing or shortness of breath.  Dispense: 150 mL; Refill: 12    Plan:    Schedule nebulizer. If not improving over the next week or increased shortness of breath to call.

## 2016-04-23 ENCOUNTER — Other Ambulatory Visit: Payer: Self-pay | Admitting: Family Medicine

## 2016-04-23 ENCOUNTER — Telehealth: Payer: Self-pay | Admitting: Family Medicine

## 2016-04-23 DIAGNOSIS — J40 Bronchitis, not specified as acute or chronic: Secondary | ICD-10-CM

## 2016-04-23 MED ORDER — AZITHROMYCIN 250 MG PO TABS: ORAL_TABLET | ORAL | 0 refills | Status: DC

## 2016-04-23 NOTE — Telephone Encounter (Signed)
Pt stated she saw Mikki Santee 04/20/16 and to call if not any better he would call in an antibiotic. Pt would like it sent to [facility]. Please advise. Thanks TNP

## 2016-04-23 NOTE — Telephone Encounter (Signed)
Pt advised. Calistro Rauf Drozdowski, CMA  

## 2016-04-23 NOTE — Telephone Encounter (Signed)
Sx x 10 days. Dry hacking cough, fatigue, decreased appetite, SOB. Afebrile. Was prescribed Hycodan and albuterol inhaler BID, without relief. Renaldo Fiddler, CMA

## 2016-04-23 NOTE — Telephone Encounter (Signed)
I have sent in Zithromax.

## 2016-04-25 ENCOUNTER — Other Ambulatory Visit: Payer: Self-pay | Admitting: Family Medicine

## 2016-05-02 DIAGNOSIS — L570 Actinic keratosis: Secondary | ICD-10-CM | POA: Diagnosis not present

## 2016-05-02 DIAGNOSIS — L578 Other skin changes due to chronic exposure to nonionizing radiation: Secondary | ICD-10-CM | POA: Diagnosis not present

## 2016-05-02 DIAGNOSIS — L820 Inflamed seborrheic keratosis: Secondary | ICD-10-CM | POA: Diagnosis not present

## 2016-05-02 DIAGNOSIS — H61002 Unspecified perichondritis of left external ear: Secondary | ICD-10-CM | POA: Diagnosis not present

## 2016-06-25 ENCOUNTER — Other Ambulatory Visit: Payer: Self-pay | Admitting: Family Medicine

## 2016-07-02 DIAGNOSIS — L820 Inflamed seborrheic keratosis: Secondary | ICD-10-CM | POA: Diagnosis not present

## 2016-07-02 DIAGNOSIS — L578 Other skin changes due to chronic exposure to nonionizing radiation: Secondary | ICD-10-CM | POA: Diagnosis not present

## 2016-07-02 DIAGNOSIS — L570 Actinic keratosis: Secondary | ICD-10-CM | POA: Diagnosis not present

## 2016-07-02 DIAGNOSIS — L720 Epidermal cyst: Secondary | ICD-10-CM | POA: Diagnosis not present

## 2016-07-02 DIAGNOSIS — H61002 Unspecified perichondritis of left external ear: Secondary | ICD-10-CM | POA: Diagnosis not present

## 2016-07-15 ENCOUNTER — Other Ambulatory Visit: Payer: Self-pay | Admitting: Family Medicine

## 2016-07-16 DIAGNOSIS — Z96652 Presence of left artificial knee joint: Secondary | ICD-10-CM | POA: Diagnosis not present

## 2016-08-07 ENCOUNTER — Ambulatory Visit (INDEPENDENT_AMBULATORY_CARE_PROVIDER_SITE_OTHER): Payer: Medicare Other | Admitting: Family Medicine

## 2016-08-07 ENCOUNTER — Ambulatory Visit (INDEPENDENT_AMBULATORY_CARE_PROVIDER_SITE_OTHER): Payer: Medicare Other

## 2016-08-07 VITALS — BP 138/78 | HR 72 | Temp 98.4°F | Resp 18 | Ht 64.0 in | Wt 200.2 lb

## 2016-08-07 DIAGNOSIS — R739 Hyperglycemia, unspecified: Secondary | ICD-10-CM | POA: Diagnosis not present

## 2016-08-07 DIAGNOSIS — Z0000 Encounter for general adult medical examination without abnormal findings: Secondary | ICD-10-CM | POA: Diagnosis not present

## 2016-08-07 DIAGNOSIS — Z23 Encounter for immunization: Secondary | ICD-10-CM | POA: Diagnosis not present

## 2016-08-07 DIAGNOSIS — M5116 Intervertebral disc disorders with radiculopathy, lumbar region: Secondary | ICD-10-CM

## 2016-08-07 DIAGNOSIS — I10 Essential (primary) hypertension: Secondary | ICD-10-CM

## 2016-08-07 DIAGNOSIS — E039 Hypothyroidism, unspecified: Secondary | ICD-10-CM | POA: Diagnosis not present

## 2016-08-07 DIAGNOSIS — E7800 Pure hypercholesterolemia, unspecified: Secondary | ICD-10-CM | POA: Diagnosis not present

## 2016-08-07 NOTE — Patient Instructions (Signed)
Kathryn [name] , Thank you for taking time to come for your Medicare Wellness Visit. I appreciate your ongoing commitment to your health goals. Please review the following plan we discussed and let me know if I can assist you in the future.   Screening recommendations/referrals: Colonoscopy: Completed 06/30/2015, no longer required Mammogram: completed 03/08/2016, no longer required Bone Density: Completed 01/17/2012 Recommended yearly ophthalmology/optometry visit for glaucoma screening and checkup Recommended yearly dental visit for hygiene and checkup  Vaccinations: Influenza vaccine: up to date, due 12/2016 Pneumococcal vaccine: Prevnar 13 due Tdap vaccine: up to date, due 01/2022 Shingles vaccine: up to date  Advanced directives: Please bring a copy of your advanced directives at your convenience.  Conditions/risks identified: Recommend to eat 1-2 cups of fruit a day.  Next appointment: none, follow up in one year for annual wellness exam.    Preventive Care 65 Years and Older, Female Preventive care refers to lifestyle choices and visits with your health care provider that can promote health and wellness. What does preventive care include?  A yearly physical exam. This is also called an annual well check.  Dental exams once or twice a year.  Routine eye exams. Ask your health care provider how often you should have your eyes checked.  Personal lifestyle choices, including:  Daily care of your teeth and gums.  Regular physical activity.  Eating a healthy diet.  Avoiding tobacco and drug use.  Limiting alcohol use.  Practicing safe sex.  Taking low-dose aspirin every day.  Taking vitamin and mineral supplements as recommended by your health care provider. What happens during an annual well check? The services and screenings done by your health care provider during your annual well check will depend on your age, overall health, lifestyle risk factors, and family  history of disease. Counseling  Your health care provider may ask you questions about your:  Alcohol use.  Tobacco use.  Drug use.  Emotional well-being.  Home and relationship well-being.  Sexual activity.  Eating habits.  History of falls.  Memory and ability to understand (cognition).  Work and work Statistician.  Reproductive health. Screening  You may have the following tests or measurements:  Height, weight, and BMI.  Blood pressure.  Lipid and cholesterol levels. These may be checked every 5 years, or more frequently if you are over 78 years old.  Skin check.  Lung cancer screening. You may have this screening every year starting at age 65 if you have a 30-pack-year history of smoking and currently smoke or have quit within the past 15 years.  Fecal occult blood test (FOBT) of the stool. You may have this test every year starting at age 33.  Flexible sigmoidoscopy or colonoscopy. You may have a sigmoidoscopy every 5 years or a colonoscopy every 10 years starting at age 68.  Hepatitis C blood test.  Hepatitis B blood test.  Sexually transmitted disease (STD) testing.  Diabetes screening. This is done by checking your blood sugar (glucose) after you have not eaten for a while (fasting). You may have this done every 1-3 years.  Bone density scan. This is done to screen for osteoporosis. You may have this done starting at age 2.  Mammogram. This may be done every 1-2 years. Talk to your health care provider about how often you should have regular mammograms. Talk with your health care provider about your test results, treatment options, and if necessary, the need for more tests. Vaccines  Your health care provider may recommend  certain vaccines, such as:  Influenza vaccine. This is recommended every year.  Tetanus, diphtheria, and acellular pertussis (Tdap, Td) vaccine. You may need a Td booster every 10 years.  Zoster vaccine. You may need this after  age 64.  Pneumococcal 13-valent conjugate (PCV13) vaccine. One dose is recommended after age 37.  Pneumococcal polysaccharide (PPSV23) vaccine. One dose is recommended after age 63. Talk to your health care provider about which screenings and vaccines you need and how often you need them. This information is not intended to replace advice given to you by your health care provider. Make sure you discuss any questions you have with your health care provider. Document Released: 04/08/2015 Document Revised: 11/30/2015 Document Reviewed: 01/11/2015 Elsevier Interactive Patient Education  2017 [place] Prevention in the Home Falls can cause injuries. They can happen to people of all ages. There are many things you can do to make your home safe and to help prevent falls. What can I do on the outside of my home?  Regularly fix the edges of walkways and driveways and fix any cracks.  Remove anything that might make you trip as you walk through a door, such as a raised step or threshold.  Trim any bushes or trees on the path to your home.  Use bright outdoor lighting.  Clear any walking paths of anything that might make someone trip, such as rocks or tools.  Regularly check to see if handrails are loose or broken. Make sure that both sides of any steps have handrails.  Any raised decks and porches should have guardrails on the edges.  Have any leaves, snow, or ice cleared regularly.  Use sand or salt on walking paths during winter.  Clean up any spills in your garage right away. This includes oil or grease spills. What can I do in the bathroom?  Use night lights.  Install grab bars by the toilet and in the tub and shower. Do not use towel bars as grab bars.  Use non-skid mats or decals in the tub or shower.  If you need to sit down in the shower, use a plastic, non-slip stool.  Keep the floor dry. Clean up any water that spills on the floor as soon as it  happens.  Remove soap buildup in the tub or shower regularly.  Attach bath mats securely with double-sided non-slip rug tape.  Do not have throw rugs and other things on the floor that can make you trip. What can I do in the bedroom?  Use night lights.  Make sure that you have a light by your bed that is easy to reach.  Do not use any sheets or blankets that are too big for your bed. They should not hang down onto the floor.  Have a firm chair that has side arms. You can use this for support while you get dressed.  Do not have throw rugs and other things on the floor that can make you trip. What can I do in the kitchen?  Clean up any spills right away.  Avoid walking on wet floors.  Keep items that you use a lot in easy-to-reach places.  If you need to reach something above you, use a strong step stool that has a grab bar.  Keep electrical cords out of the way.  Do not use floor polish or wax that makes floors slippery. If you must use wax, use non-skid floor wax.  Do not have throw rugs and other  things on the floor that can make you trip. What can I do with my stairs?  Do not leave any items on the stairs.  Make sure that there are handrails on both sides of the stairs and use them. Fix handrails that are broken or loose. Make sure that handrails are as long as the stairways.  Check any carpeting to make sure that it is firmly attached to the stairs. Fix any carpet that is loose or worn.  Avoid having throw rugs at the top or bottom of the stairs. If you do have throw rugs, attach them to the floor with carpet tape.  Make sure that you have a light switch at the top of the stairs and the bottom of the stairs. If you do not have them, ask someone to add them for you. What else can I do to help prevent falls?  Wear shoes that:  Do not have high heels.  Have rubber bottoms.  Are comfortable and fit you well.  Are closed at the toe. Do not wear sandals.  If you  use a stepladder:  Make sure that it is fully opened. Do not climb a closed stepladder.  Make sure that both sides of the stepladder are locked into place.  Ask someone to hold it for you, if possible.  Clearly mark and make sure that you can see:  Any grab bars or handrails.  First and last steps.  Where the edge of each step is.  Use tools that help you move around (mobility aids) if they are needed. These include:  Canes.  Walkers.  Scooters.  Crutches.  Turn on the lights when you go into a dark area. Replace any light bulbs as soon as they burn out.  Set up your furniture so you have a clear path. Avoid moving your furniture around.  If any of your floors are uneven, fix them.  If there are any pets around you, be aware of where they are.  Review your medicines with your doctor. Some medicines can make you feel dizzy. This can increase your chance of falling. Ask your doctor what other things that you can do to help prevent falls. This information is not intended to replace advice given to you by your health care provider. Make sure you discuss any questions you have with your health care provider. Document Released: 01/06/2009 Document Revised: 08/18/2015 Document Reviewed: 04/16/2014 Elsevier Interactive Patient Education  2017 [employer].

## 2016-08-07 NOTE — Progress Notes (Signed)
Patient: Kathryn [name] Female    DOB: Jul 29, 1933   83 y.o.   MRN: [id number] Visit Date: 08/07/2016  Today's Provider: [name], MD   Chief Complaint  Patient presents with  . Hypertension  . Hyperlipidemia  . Hyperglycemia   Subjective:    HPI  Hypertension, follow-up:  BP Readings from Last 3 Encounters:  08/07/16 138/78  04/20/16 120/60  02/07/16 (!) 142/78    She was last seen for hypertension 6 months ago.  BP at that visit was 142/78. Management since that visit includes no changes. Continue to monitor BP. She reports good compliance with treatment. She is not having side effects.  She is not exercising. She is adherent to low salt diet.   Outside blood pressures are checked occasionally. She is experiencing none.  Patient denies exertional chest pressure/discomfort.   Cardiovascular risk factors include dyslipidemia and obesity (BMI >= 30 kg/m2).    Weight trend: stable Wt Readings from Last 3 Encounters:  08/07/16 200 lb 3.2 oz (90.8 kg)  04/20/16 207 lb (93.9 kg)  02/07/16 203 lb (92.1 kg)    Current diet: well balanced    Lipid/Cholesterol, Follow-up:   Last seen for this6 months ago.  Management changes since that visit include no changes. . Last Lipid Panel:    Component Value Date/Time   CHOL 197 08/24/2015 0809   TRIG 211 (H) 08/24/2015 0809   HDL 56 08/24/2015 0809   [place] 99 08/24/2015 0809    Risk factors for vascular disease include hypertension  She reports good compliance with treatment. She is not having side effects.  Current symptoms include none and have been stable. Weight trend: stable Prior visit with dietician: no Current diet: well balanced Current exercise: none  Wt Readings from Last 3 Encounters:  08/07/16 200 lb 3.2 oz (90.8 kg)  04/20/16 207 lb (93.9 kg)  02/07/16 203 lb (92.1 kg)      Hyperglycemia, Follow-up:   Lab Results  Component Value Date   GLUCOSE 183 (H) 08/24/2015   GLUCOSE 111 (H) 09/17/2014   GLUCOSE 169 (H) 07/09/2014    Last seen for for this 6 months ago.  Management since then includes monitor diet. Current symptoms include none and have been stable.  Weight trend: stable Prior visit with dietician: no Current diet: well balanced Current exercise: none  Pertinent Labs:    Component Value Date/Time   CHOL 197 08/24/2015 0809   TRIG 211 (H) 08/24/2015 0809   CREATININE 0.87 08/24/2015 0809   CREATININE 0.85 07/09/2014 0529    Wt Readings from Last 3 Encounters:  08/07/16 200 lb 3.2 oz (90.8 kg)  04/20/16 207 lb (93.9 kg)  02/07/16 203 lb (92.1 kg)       Allergies  Allergen Reactions  . Oxycodone Other (See Comments)    Mental status change  . Prednisone     swelling, bruising.     Current Outpatient Prescriptions:  .  acetaminophen (TYLENOL) 500 MG tablet, Take 1,000 mg by mouth every 6 (six) hours as needed. Pain , Disp: , Rfl:  .  albuterol (PROVENTIL) (2.5 MG/3ML) 0.083% nebulizer solution, Take 3 mLs (2.5 mg total) by nebulization every 6 (six) hours as needed for wheezing or shortness of breath., Disp: 150 mL, Rfl: 12 .  amLODipine (NORVASC) 5 MG tablet, TAKE 1 TABLET BY MOUTH EVERY DAY FOR HYPERTENSION, Disp: 30 tablet, Rfl: 12 .  atorvastatin (LIPITOR) 40 MG tablet, TAKE 1 TABLET BY MOUTH  EVERY DAY, Disp: 30 tablet, Rfl: 0 .  cetirizine (ZYRTEC) 10 MG tablet, Take 10 mg by mouth at bedtime. , Disp: , Rfl:  .  DULoxetine (CYMBALTA) 20 MG capsule, Take 1 capsule (20 mg total) by mouth daily., Disp: 30 capsule, Rfl: 12 .  hydrOXYzine (ATARAX/VISTARIL) 10 MG tablet, TAKE 1 TO 2 TABLETS BY MOUTH EVERY 6 HOURS AS NEEDED, Disp: 100 tablet, Rfl: 5 .  levothyroxine (SYNTHROID, LEVOTHROID) 100 MCG tablet, TAKE 1 TABLET(100 MCG) BY MOUTH DAILY BEFORE BREAKFAST, Disp: 30 tablet, Rfl: 0 .  lisinopril (PRINIVIL,ZESTRIL) 10 MG tablet, TAKE 1 TABLET BY MOUTH EVERY NIGHT AT BEDTIME FOR HYPERTENSION, Disp: 30 tablet, Rfl: 11 .   omeprazole (PRILOSEC) 20 MG capsule, Take 20 mg by mouth daily., Disp: , Rfl:   Review of Systems  Constitutional: Negative.   Respiratory: Negative.   Cardiovascular: Negative.   Endocrine: Negative.   Musculoskeletal: Positive for arthralgias.  Allergic/Immunologic: Negative.   Neurological: Negative.   Psychiatric/Behavioral: Negative.         Home Exercise  08/07/2016  Current Exercise Habits The patient does not participate in regular exercise at present  Exercise limited by: orthopedic condition(s)   BMI and BSA Data   Body Mass Index: 34.36 kg/m Body Surface Area: 2.02 m       Social History  Substance Use Topics  . Smoking status: Former Smoker    Packs/day: 0.50    Years: 50.00    Types: Cigarettes    Quit date: 05/16/2004  . Smokeless tobacco: Never Used  . Alcohol use No   Objective:   BP  138/78 (BP Location: Right Arm, Patient Position: Sitting)     Pulse  72     Temp  98.4 F (36.9 C)     Resp  18     Ht  5\' 4"  (1.626 m)      Wt  200 lb 3.2 oz (90.8 kg)   BMI  34.36 kg/m         Physical Exam  Constitutional: She is oriented to person, place, and time. She appears well-developed and well-nourished.  HENT:  Head: Normocephalic and atraumatic.  Right Ear: External ear normal.  Left Ear: External ear normal.  Nose: Nose normal.  Eyes: Conjunctivae are normal. No scleral icterus.  Neck: No thyromegaly present.  Cardiovascular: Normal rate, regular rhythm and normal heart sounds.   Pulmonary/Chest: Effort normal and breath sounds normal.  Abdominal: Soft. Bowel sounds are normal.  Musculoskeletal: Normal range of motion.  Neurological: She is alert and oriented to person, place, and time. No cranial nerve deficit. She exhibits normal muscle tone. Coordination normal.  Skin: Skin is warm and dry.  Psychiatric: She has a normal mood and affect. Her behavior is normal. Judgment and thought content normal.        Assessment &  Plan:     1. Essential hypertension  - CBC with Differential/Platelet - Comprehensive metabolic panel  2. Adult hypothyroidism  - TSH  3. Hypercholesteremia  - Lipid panel  4. Hyperglycemia  - Hemoglobin A1c 5.DDD 6.OA      I have done the exam and reviewed the above chart and it is accurate to the best of my knowledge. Development worker, community has been used in this note in any air is in the dictation or transcription are unintentional.  [name], MD  [facility]

## 2016-08-07 NOTE — Progress Notes (Signed)
Subjective:   Kathryn [name] is a 83 y.o. female who presents for Medicare Annual (Subsequent) preventive examination.  Review of Systems:   Cardiac Risk Factors include: advanced age (>30men, >63 women);obesity (BMI >30kg/m2);dyslipidemia;hypertension     Objective:     Vitals: BP 138/78 (BP Location: Right Arm, Patient Position: Sitting)   Pulse 72   Temp 98.4 F (36.9 C)   Resp 18   Ht 5\' 4"  (1.626 m)   Wt 200 lb 3.2 oz (90.8 kg)   BMI 34.36 kg/m   Body mass index is 34.36 kg/m.   Tobacco History  Smoking Status  . Former Smoker  . Packs/day: 0.50  . Years: 50.00  . Types: Cigarettes  . Quit date: 05/16/2004  Smokeless Tobacco  . Never Used     Counseling given: Not Answered   Past Medical History:  Diagnosis Date  . Arthritis    spinal stenosis  . Gastric ulcer 4/12   treated with Prolisec- states no problems now  . Hyperlipidemia   . Hypertension    PCP Dr Miguel Aschoff   [place]  . Hypothyroidism   . Neuromuscular disorder ([place])    slight numbness right toes- comes and goes  . Peripheral vascular disease (HCC)    varicose veins left leg  . Pneumonia   . Seasonal allergies   . Shortness of breath   . Skin cancer    multiple from face   Past Surgical History:  Procedure Laterality Date  . BACK SURGERY     4/12  Dr Shellia Carwin- for lumbar stenosis  . BREAST CYST ASPIRATION Left 1965   negative  . BREAST SURGERY  1962   lumpectomy with biopsy   left  . CARPAL TUNNEL RELEASE     right  . COLONOSCOPY    . COLONOSCOPY WITH PROPOFOL N/A 06/30/2015   Procedure: COLONOSCOPY WITH PROPOFOL;  Surgeon: Hulen Luster, MD;  Location: [facility] ENDOSCOPY;  Service: Gastroenterology;  Laterality: N/A;  . ESOPHAGOGASTRODUODENOSCOPY    . EYE SURGERY     cataract extraction with IOL bilaterally  . JOINT REPLACEMENT    . LUMBAR LAMINECTOMY/DECOMPRESSION MICRODISCECTOMY  05/24/2011   Procedure: LUMBAR LAMINECTOMY/DECOMPRESSION MICRODISCECTOMY 2 LEVELS;  Surgeon:  Magnus Sinning, MD;  Location: [facility] ORS;  Service: Orthopedics;  Laterality: N/A;  L2-L3, L3-L4 (x-ray)  . RIGHT OOPHORECTOMY     due to STAPH INFECTION   Family History  Problem Relation Age of Onset  . Dementia Mother   . Lung cancer Father   . Heart disease Father   . Breast cancer Maternal Aunt   . Arthritis Son    History  Sexual Activity  . Sexual activity: Yes  . Birth control/ protection: None    Outpatient Encounter Prescriptions as of 08/07/2016  Medication Sig  . acetaminophen (TYLENOL) 500 MG tablet Take 1,000 mg by mouth every 6 (six) hours as needed. Pain   . albuterol (PROVENTIL) (2.5 MG/3ML) 0.083% nebulizer solution Take 3 mLs (2.5 mg total) by nebulization every 6 (six) hours as needed for wheezing or shortness of breath.  Marland Kitchen amLODipine (NORVASC) 5 MG tablet TAKE 1 TABLET BY MOUTH EVERY DAY FOR HYPERTENSION  . atorvastatin (LIPITOR) 40 MG tablet TAKE 1 TABLET BY MOUTH EVERY DAY  . cetirizine (ZYRTEC) 10 MG tablet Take 10 mg by mouth at bedtime.   . DULoxetine (CYMBALTA) 20 MG capsule Take 1 capsule (20 mg total) by mouth daily.  . hydrOXYzine (ATARAX/VISTARIL) 10 MG tablet TAKE 1 TO 2  TABLETS BY MOUTH EVERY 6 HOURS AS NEEDED  . levothyroxine (SYNTHROID, LEVOTHROID) 100 MCG tablet TAKE 1 TABLET(100 MCG) BY MOUTH DAILY BEFORE BREAKFAST  . lisinopril (PRINIVIL,ZESTRIL) 10 MG tablet TAKE 1 TABLET BY MOUTH EVERY NIGHT AT BEDTIME FOR HYPERTENSION  . omeprazole (PRILOSEC) 20 MG capsule Take 20 mg by mouth daily.  . [DISCONTINUED] azithromycin (ZITHROMAX Z-PAK) 250 MG tablet Take two pills today then one pill daily  . [DISCONTINUED] HYDROcodone-acetaminophen (NORCO/VICODIN) 5-325 MG tablet Take 1 tablet by mouth every 4 (four) hours as needed for moderate pain. (Patient not taking: Reported on 08/07/2016)  . [DISCONTINUED] HYDROcodone-homatropine (HYCODAN) 5-1.5 MG/5ML syrup 5 ml 4-6 hours as needed for cough   No facility-administered encounter medications on file as of  08/07/2016.     Activities of Daily Living In your present state of health, do you have any difficulty performing the following activities: 08/07/2016  Hearing? Y  Vision? Y  Difficulty concentrating or making decisions? N  Walking or climbing stairs? Y  Dressing or bathing? N  Doing errands, shopping? Y  Preparing Food and eating ? N  Using the Toilet? N  In the past six months, have you accidently leaked urine? Y  Do you have problems with loss of bowel control? N  Managing your Medications? N  Managing your Finances? N  Housekeeping or managing your Housekeeping? N  Some recent data might be hidden    Patient Care Team: Jerrol Banana., MD as PCP - General (Unknown Physician Specialty) Hessie Knows, MD as Consulting Physician (Orthopedic Surgery)    Assessment:     Exercise Activities and Dietary recommendations Current Exercise Habits: The patient does not participate in regular exercise at present, Exercise limited by: orthopedic condition(s)  Goals    . Eat more fruits and vegetables          Recommend to eat 1-2 cups of fruit a day.      Fall Risk Fall Risk  08/07/2016 11/11/2014  Falls in the past year? No No   Depression Screen PHQ 2/9 Scores 08/07/2016 08/07/2016 11/11/2014  PHQ - 2 Score 0 0 0  PHQ- 9 Score 2 - -     Cognitive Function     6CIT Screen 08/07/2016  What Year? 0 points  What month? 0 points  What time? 0 points  Count back from 20 0 points  Months in reverse 0 points  Repeat phrase 2 points  Total Score 2    Immunization History  Administered Date(s) Administered  . Influenza,inj,Quad PF,36+ Mos 11/12/2013  . Influenza-Unspecified 11/25/2014  . Pneumococcal Conjugate-13 08/07/2016  . Pneumococcal Polysaccharide-23 03/27/2003  . Tdap 02/10/2013  . Zoster 02/10/2013, 11/26/2014   Screening Tests Health Maintenance  Topic Date Due  . PNA vac Low Risk Adult (2 of 2 - PCV13) 03/26/2004  . INFLUENZA VACCINE  10/24/2016  .  TETANUS/TDAP  02/11/2023  . DEXA SCAN  Completed      Plan:   I have personally reviewed and addressed the Medicare Annual Wellness questionnaire and have noted the following in the patient's chart:  A. Medical and social history B. Use of alcohol, tobacco or illicit drugs  C. Current medications and supplements D. Functional ability and status E.  Nutritional status F.  Physical activity G. Advance directives H. List of other physicians I.  Hospitalizations, surgeries, and ER visits in previous 12 months J.  [place] such as hearing and vision if needed, cognitive and depression L. Referrals and appointments -  none  In addition, I have reviewed and discussed with patient certain preventive protocols, quality metrics, and best practice recommendations. A written personalized care plan for preventive services as well as general preventive health recommendations were provided to patient.  See attached scanned questionnaire for additional information.   Signed,  Tyler Aas, LPN Nurse Health Advisor   MD Recommendations: none I have reviewed the health advisors note, was  available for consultation and I agree with documentation and plan. Miguel Aschoff MD [facility]

## 2016-08-08 DIAGNOSIS — R739 Hyperglycemia, unspecified: Secondary | ICD-10-CM | POA: Diagnosis not present

## 2016-08-08 DIAGNOSIS — E7800 Pure hypercholesterolemia, unspecified: Secondary | ICD-10-CM | POA: Diagnosis not present

## 2016-08-08 DIAGNOSIS — I10 Essential (primary) hypertension: Secondary | ICD-10-CM | POA: Diagnosis not present

## 2016-08-08 DIAGNOSIS — E039 Hypothyroidism, unspecified: Secondary | ICD-10-CM | POA: Diagnosis not present

## 2016-08-09 ENCOUNTER — Telehealth: Payer: Self-pay

## 2016-08-09 DIAGNOSIS — E119 Type 2 diabetes mellitus without complications: Secondary | ICD-10-CM | POA: Insufficient documentation

## 2016-08-09 LAB — COMPREHENSIVE METABOLIC PANEL
ALT: 17 IU/L (ref 0–32)
AST: 19 IU/L (ref 0–40)
Albumin/Globulin Ratio: 1.7 (ref 1.2–2.2)
Albumin: 4.5 g/dL (ref 3.5–4.7)
Alkaline Phosphatase: 91 IU/L (ref 39–117)
BUN/Creatinine Ratio: 23 (ref 12–28)
BUN: 19 mg/dL (ref 8–27)
Bilirubin Total: 0.4 mg/dL (ref 0.0–1.2)
CO2: 21 mmol/L (ref 18–29)
Calcium: 10.3 mg/dL (ref 8.7–10.3)
Chloride: 97 mmol/L (ref 96–106)
Creatinine, Ser: 0.81 mg/dL (ref 0.57–1.00)
GFR calc Af Amer: 79 mL/min/{1.73_m2} (ref >59)
GFR calc non Af Amer: 69 mL/min/{1.73_m2} (ref >59)
Globulin, Total: 2.7 g/dL (ref 1.5–4.5)
Glucose: 309 mg/dL — ABNORMAL HIGH (ref 65–99)
Potassium: 5.6 mmol/L — ABNORMAL HIGH (ref 3.5–5.2)
Sodium: 138 mmol/L (ref 134–144)
Total Protein: 7.2 g/dL (ref 6.0–8.5)

## 2016-08-09 LAB — LIPID PANEL
Chol/HDL Ratio: 3.6 ratio (ref 0.0–4.4)
Cholesterol, Total: 201 mg/dL — ABNORMAL HIGH (ref 100–199)
HDL: 56 mg/dL (ref >39)
LDL Calculated: 109 mg/dL — ABNORMAL HIGH (ref 0–99)
Triglycerides: 180 mg/dL — ABNORMAL HIGH (ref 0–149)
VLDL Cholesterol Cal: 36 mg/dL (ref 5–40)

## 2016-08-09 LAB — CBC WITH DIFFERENTIAL/PLATELET
Basophils Absolute: 0 10*3/uL (ref 0.0–0.2)
Basos: 0 %
EOS (ABSOLUTE): 0.2 10*3/uL (ref 0.0–0.4)
Eos: 3 %
Hematocrit: 41.8 % (ref 34.0–46.6)
Hemoglobin: 13.4 g/dL (ref 11.1–15.9)
Immature Grans (Abs): 0 10*3/uL (ref 0.0–0.1)
Immature Granulocytes: 1 %
Lymphocytes Absolute: 2 10*3/uL (ref 0.7–3.1)
Lymphs: 36 %
MCH: 27.7 pg (ref 26.6–33.0)
MCHC: 32.1 g/dL (ref 31.5–35.7)
MCV: 86 fL (ref 79–97)
Monocytes Absolute: 0.4 10*3/uL (ref 0.1–0.9)
Monocytes: 8 %
Neutrophils Absolute: 3 10*3/uL (ref 1.4–7.0)
Neutrophils: 52 %
Platelets: 251 10*3/uL (ref 150–379)
RBC: 4.84 x10E6/uL (ref 3.77–5.28)
RDW: 14.4 % (ref 12.3–15.4)
WBC: 5.7 10*3/uL (ref 3.4–10.8)

## 2016-08-09 LAB — HEMOGLOBIN A1C
Est. average glucose Bld gHb Est-mCnc: 286 mg/dL
Hgb A1c MFr Bld: 11.6 % — ABNORMAL HIGH (ref 4.8–5.6)

## 2016-08-09 LAB — TSH: TSH: 0.662 u[IU]/mL (ref 0.450–4.500)

## 2016-08-09 MED ORDER — METFORMIN HCL 1000 MG PO TABS: 1000.0000 mg | ORAL_TABLET | Freq: Every day | ORAL | 3 refills | Status: DC

## 2016-08-09 NOTE — Telephone Encounter (Signed)
-----   Message from Jerrol Banana., MD sent at 08/09/2016  2:25 PM EDT ----- Diabetes mellitus under poor control. Start metformin 1000 mg at supper

## 2016-08-09 NOTE — Telephone Encounter (Signed)
Pt advised. Medication sent to pharmacy and FU made. Renaldo Fiddler, CMA

## 2016-08-13 ENCOUNTER — Other Ambulatory Visit: Payer: Self-pay | Admitting: Family Medicine

## 2016-09-05 DIAGNOSIS — Z86010 Personal history of colonic polyps: Secondary | ICD-10-CM | POA: Diagnosis not present

## 2016-09-18 IMAGING — CR DG PELVIS 1-2V
1 series · 1 of 1 positions shown · non-contrast
Comparison: None.

CLINICAL DATA: Right pelvic pain about the iliac crests for
approximately 1 week. No known injury. Initial encounter.

EXAM:
PELVIS - 1-2 VIEW

[dg pelvis 1-2 views]
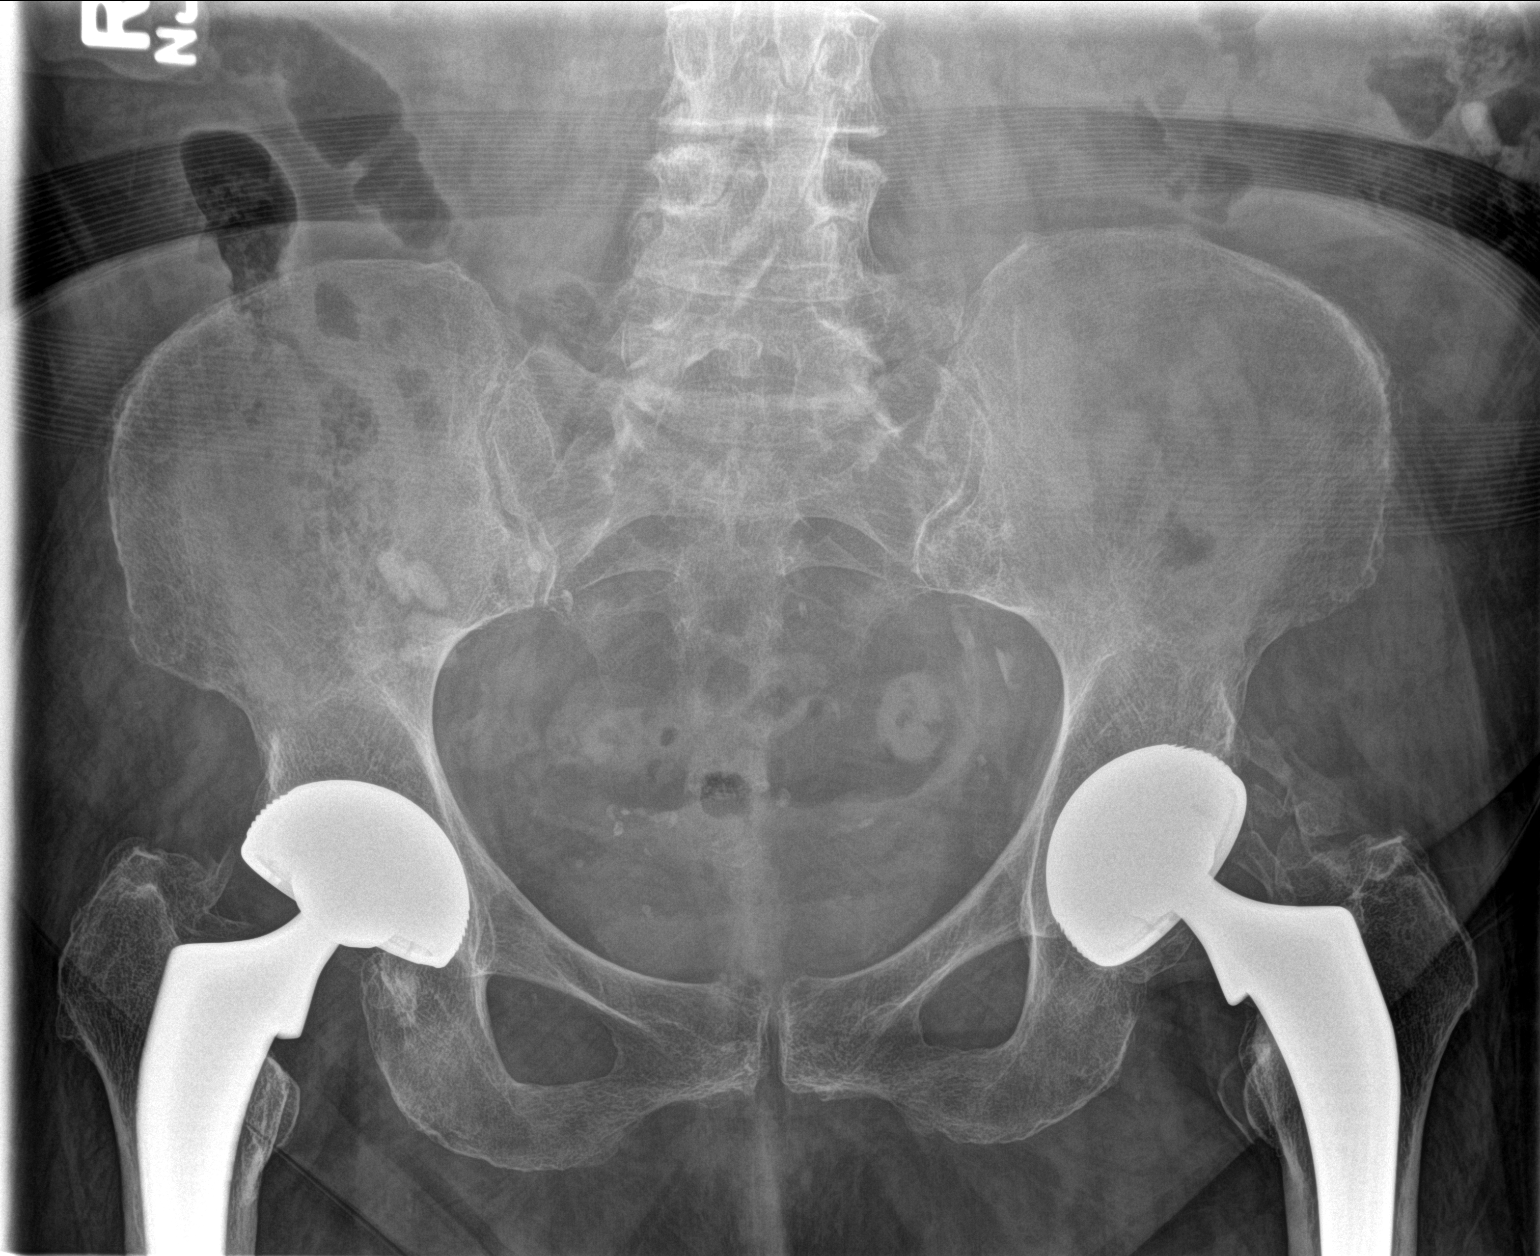

[1 of 1 positions shown; findings below may reference images not displayed]

FINDINGS: Bilateral total hip arthroplasties are in place. Heterotopic
calcification is seen about the left hip replacement. Both devices
are located. No acute bony or joint abnormality is seen. No focal
bony lesion is identified.
IMPRESSION: Bilateral total hip replacements. Heterotopic calcification about
the left hip is noted. No acute abnormality is seen.

## 2016-10-16 DIAGNOSIS — H40003 Preglaucoma, unspecified, bilateral: Secondary | ICD-10-CM | POA: Diagnosis not present

## 2016-10-16 DIAGNOSIS — Z961 Presence of intraocular lens: Secondary | ICD-10-CM | POA: Diagnosis not present

## 2016-10-16 DIAGNOSIS — H04129 Dry eye syndrome of unspecified lacrimal gland: Secondary | ICD-10-CM | POA: Diagnosis not present

## 2016-10-16 DIAGNOSIS — H26492 Other secondary cataract, left eye: Secondary | ICD-10-CM | POA: Diagnosis not present

## 2016-10-31 DIAGNOSIS — M7582 Other shoulder lesions, left shoulder: Secondary | ICD-10-CM | POA: Diagnosis not present

## 2016-10-31 DIAGNOSIS — M19012 Primary osteoarthritis, left shoulder: Secondary | ICD-10-CM | POA: Diagnosis not present

## 2016-10-31 DIAGNOSIS — M25512 Pain in left shoulder: Secondary | ICD-10-CM | POA: Diagnosis not present

## 2016-11-12 ENCOUNTER — Ambulatory Visit: Payer: Self-pay | Admitting: Family Medicine

## 2016-11-15 ENCOUNTER — Ambulatory Visit (INDEPENDENT_AMBULATORY_CARE_PROVIDER_SITE_OTHER): Payer: Medicare Other | Admitting: Family Medicine

## 2016-11-15 VITALS — BP 140/70 | HR 90 | Temp 98.0°F | Resp 16 | Ht 64.0 in | Wt 195.0 lb

## 2016-11-15 DIAGNOSIS — E119 Type 2 diabetes mellitus without complications: Secondary | ICD-10-CM | POA: Diagnosis not present

## 2016-11-15 MED ORDER — GLUCOSE BLOOD VI STRP
ORAL_STRIP | 12 refills | Status: DC
Start: 2016-11-15 — End: 2016-11-16

## 2016-11-15 NOTE — Progress Notes (Signed)
Kathryn [name]  MRN: [id number] DOB: 20-Aug-1933  Subjective:  HPI   The patient is an 82 year old female who presents for follow up of her diabetes.  She was last seen on 08/08/16.  Her A1C at that time was 11.6.  She was started on Metformin 1000 mg with supper at that time.  She is due for urine micro-albumin, diabetic foot and BMI check today.  Patient Active Problem List   Diagnosis Date Noted  . Diabetes mellitus type 2, uncomplicated ([place]) 24/58/0998  . Absolute anemia 09/17/2014  . COPD, mild ([place]) 09/17/2014  . Clinical depression 09/17/2014  . Elevated platelet count 09/17/2014  . Elevated erythrocyte sedimentation rate 09/17/2014  . History of tobacco use 09/17/2014  . Hypercholesteremia 09/17/2014  . BP (high blood pressure) 09/17/2014  . Adult hypothyroidism 09/17/2014  . Cannot sleep 09/17/2014  . Anemia, iron deficiency 09/17/2014  . Lumbar disc disease with radiculopathy 09/17/2014  . Arthritis, degenerative 09/17/2014  . Excess weight 09/17/2014  . Vertebrobasilar circulation transient ischemic attack 09/17/2014  . Lung nodule, multiple 09/17/2014  . Basilar artery insufficiency 09/17/2014  . Neuritis or radiculitis due to rupture of lumbar intervertebral disc 08/25/2013  . Degenerative arthritis of lumbar spine 08/25/2013  . Lumbar canal stenosis 08/25/2013  . Smoker 07/15/2012  . Back pain 07/15/2012  . SOB (shortness of breath) 07/15/2012  . Hyperlipidemia 07/15/2012  . Essential hypertension 07/15/2012    Past Medical History:  Diagnosis Date  . Arthritis    spinal stenosis  . Gastric ulcer 4/12   treated with Prolisec- states no problems now  . Hyperlipidemia   . Hypertension    PCP Dr Miguel Aschoff   [place]  . Hypothyroidism   . Neuromuscular disorder ([place])    slight numbness right toes- comes and goes  . Peripheral vascular disease (HCC)    varicose veins left leg  . Pneumonia   . Seasonal allergies   . Shortness of breath   .  Skin cancer    multiple from face    Social History   Social History  . Marital status: Widowed    Spouse name: N/A  . Number of children: N/A  . Years of education: N/A   Occupational History  . Not on file.   Social History Main Topics  . Smoking status: Former Smoker    Packs/day: 0.50    Years: 50.00    Types: Cigarettes    Quit date: 05/16/2004  . Smokeless tobacco: Never Used  . Alcohol use No  . Drug use: No  . Sexual activity: Yes    Birth control/ protection: None   Other Topics Concern  . Not on file   Social History Narrative  . No narrative on file    Outpatient Encounter Prescriptions as of 11/15/2016  Medication Sig Note  . acetaminophen (TYLENOL) 500 MG tablet Take 1,000 mg by mouth every 6 (six) hours as needed. Pain    . albuterol (PROVENTIL) (2.5 MG/3ML) 0.083% nebulizer solution Take 3 mLs (2.5 mg total) by nebulization every 6 (six) hours as needed for wheezing or shortness of breath.   Marland Kitchen amLODipine (NORVASC) 5 MG tablet TAKE 1 TABLET BY MOUTH EVERY DAY FOR HYPERTENSION   . atorvastatin (LIPITOR) 40 MG tablet TAKE 1 TABLET BY MOUTH EVERY DAY   . cetirizine (ZYRTEC) 10 MG tablet Take 10 mg by mouth at bedtime.  02/07/2016: prn  . DULoxetine (CYMBALTA) 20 MG capsule Take 1 capsule (20 mg total) by  mouth daily. 08/07/2016: Takes every other day  . hydrOXYzine (ATARAX/VISTARIL) 10 MG tablet TAKE 1 TO 2 TABLETS BY MOUTH EVERY 6 HOURS AS NEEDED 02/07/2016: prn  . levothyroxine (SYNTHROID, LEVOTHROID) 100 MCG tablet TAKE 1 TABLET(100 MCG) BY MOUTH DAILY BEFORE BREAKFAST   . lisinopril (PRINIVIL,ZESTRIL) 10 MG tablet TAKE 1 TABLET BY MOUTH EVERY NIGHT AT BEDTIME FOR HYPERTENSION   . metFORMIN (GLUCOPHAGE) 1000 MG tablet Take 1 tablet (1,000 mg total) by mouth daily with supper.   Marland Kitchen omeprazole (PRILOSEC) 20 MG capsule Take 20 mg by mouth daily.    No facility-administered encounter medications on file as of 11/15/2016.     Allergies  Allergen Reactions  .  Oxycodone Other (See Comments)    Mental status change  . Prednisone     swelling, bruising.    Review of Systems  Constitutional: Negative for fever and malaise/fatigue.  Respiratory: Negative for cough, shortness of breath and wheezing.   Cardiovascular: Negative for chest pain, palpitations, orthopnea and leg swelling.  Neurological: Negative for weakness.    Objective:  BP 140/70 (BP Location: Right Arm, Patient Position: Sitting, Cuff Size: Normal)   Pulse 90   Temp 98 F (36.7 C) (Oral)   Resp 16   Ht 5\' 4"  (1.626 m)   Wt 195 lb (88.5 kg)   BMI 33.47 kg/m   Physical Exam  Constitutional: She is oriented to person, place, and time and well-developed, well-nourished, and in no distress.  HENT:  Head: Normocephalic and atraumatic.  Right Ear: External ear normal.  Left Ear: External ear normal.  Nose: Nose normal.  Eyes: Pupils are equal, round, and reactive to light.  Neck: Normal range of motion.  Cardiovascular: Normal rate, regular rhythm, normal heart sounds and intact distal pulses.   Pulmonary/Chest: Effort normal and breath sounds normal.  Abdominal: Soft.  Neurological: She is alert and oriented to person, place, and time. GCS score is 15.  Skin: Skin is warm and dry.  Psychiatric: Mood, memory, affect and judgment normal.    Assessment and Plan :  1. Type 2 diabetes mellitus without complication, without long-term current use of insulin (HCC)  - Hemoglobin A1c - Microalbumin / creatinine urine ratio 2.DDD 3.OA 4.HTN 5.GERD  I have done the exam and reviewed the chart and it is accurate to the best of my knowledge. Development worker, community has been used and  any errors in dictation or transcription are unintentional. Miguel Aschoff M.D. [facility]

## 2016-11-16 ENCOUNTER — Telehealth: Payer: Self-pay | Admitting: Family Medicine

## 2016-11-16 DIAGNOSIS — E119 Type 2 diabetes mellitus without complications: Secondary | ICD-10-CM

## 2016-11-16 LAB — HEMOGLOBIN A1C
Est. average glucose Bld gHb Est-mCnc: 214 mg/dL
Hgb A1c MFr Bld: 9.1 % — ABNORMAL HIGH (ref 4.8–5.6)

## 2016-11-16 LAB — MICROALBUMIN / CREATININE URINE RATIO
Creatinine, Urine: 141.9 mg/dL
Microalb/Creat Ratio: 12.5 mg/g creat (ref 0.0–30.0)
Microalbumin, Urine: 17.8 ug/mL

## 2016-11-16 MED ORDER — GLUCOSE BLOOD VI STRP: ORAL_STRIP | 3 refills | Status: AC

## 2016-11-16 NOTE — Telephone Encounter (Signed)
RX re sent-aa

## 2016-11-16 NOTE — Telephone Encounter (Signed)
Pt is requesting the Rx for glucose blood (CONTOUR NEXT TEST) test strip need to be resent to [facility] in [place].  CB#[phone]/MW

## 2016-12-10 ENCOUNTER — Other Ambulatory Visit: Payer: Self-pay | Admitting: Family Medicine

## 2016-12-18 DIAGNOSIS — Z23 Encounter for immunization: Secondary | ICD-10-CM | POA: Diagnosis not present

## 2017-01-22 ENCOUNTER — Ambulatory Visit: Payer: Medicare Other | Admitting: Anesthesiology

## 2017-01-22 ENCOUNTER — Encounter: Payer: Self-pay | Admitting: Anesthesiology

## 2017-01-22 ENCOUNTER — Encounter
Admission: RE | Disposition: A | Discharge status: Home / Self Care | Payer: Self-pay | Source: Ambulatory Visit | Attending: Gastroenterology

## 2017-01-22 ENCOUNTER — Ambulatory Visit
Admission: RE | Admit: 2017-01-22 | Discharge: 2017-01-22 | Disposition: A | Discharge status: Home / Self Care | Payer: Medicare Other | Source: Ambulatory Visit | Attending: Gastroenterology | Admitting: Gastroenterology

## 2017-01-22 DIAGNOSIS — E119 Type 2 diabetes mellitus without complications: Secondary | ICD-10-CM | POA: Diagnosis not present

## 2017-01-22 DIAGNOSIS — J449 Chronic obstructive pulmonary disease, unspecified: Secondary | ICD-10-CM | POA: Diagnosis not present

## 2017-01-22 DIAGNOSIS — E785 Hyperlipidemia, unspecified: Secondary | ICD-10-CM | POA: Diagnosis not present

## 2017-01-22 DIAGNOSIS — I739 Peripheral vascular disease, unspecified: Secondary | ICD-10-CM | POA: Diagnosis not present

## 2017-01-22 DIAGNOSIS — K5730 Diverticulosis of large intestine without perforation or abscess without bleeding: Secondary | ICD-10-CM | POA: Diagnosis not present

## 2017-01-22 DIAGNOSIS — Z87891 Personal history of nicotine dependence: Secondary | ICD-10-CM | POA: Diagnosis not present

## 2017-01-22 DIAGNOSIS — D120 Benign neoplasm of cecum: Secondary | ICD-10-CM | POA: Insufficient documentation

## 2017-01-22 DIAGNOSIS — Z85828 Personal history of other malignant neoplasm of skin: Secondary | ICD-10-CM | POA: Insufficient documentation

## 2017-01-22 DIAGNOSIS — K219 Gastro-esophageal reflux disease without esophagitis: Secondary | ICD-10-CM | POA: Insufficient documentation

## 2017-01-22 DIAGNOSIS — D123 Benign neoplasm of transverse colon: Secondary | ICD-10-CM | POA: Insufficient documentation

## 2017-01-22 DIAGNOSIS — E039 Hypothyroidism, unspecified: Secondary | ICD-10-CM | POA: Insufficient documentation

## 2017-01-22 DIAGNOSIS — K621 Rectal polyp: Secondary | ICD-10-CM | POA: Insufficient documentation

## 2017-01-22 DIAGNOSIS — Z888 Allergy status to other drugs, medicaments and biological substances status: Secondary | ICD-10-CM | POA: Insufficient documentation

## 2017-01-22 DIAGNOSIS — K635 Polyp of colon: Secondary | ICD-10-CM | POA: Diagnosis not present

## 2017-01-22 DIAGNOSIS — I10 Essential (primary) hypertension: Secondary | ICD-10-CM | POA: Diagnosis not present

## 2017-01-22 DIAGNOSIS — E1151 Type 2 diabetes mellitus with diabetic peripheral angiopathy without gangrene: Secondary | ICD-10-CM | POA: Diagnosis not present

## 2017-01-22 DIAGNOSIS — Z7984 Long term (current) use of oral hypoglycemic drugs: Secondary | ICD-10-CM | POA: Insufficient documentation

## 2017-01-22 DIAGNOSIS — Z1211 Encounter for screening for malignant neoplasm of colon: Secondary | ICD-10-CM | POA: Diagnosis not present

## 2017-01-22 DIAGNOSIS — D128 Benign neoplasm of rectum: Secondary | ICD-10-CM | POA: Diagnosis not present

## 2017-01-22 DIAGNOSIS — Z885 Allergy status to narcotic agent status: Secondary | ICD-10-CM | POA: Diagnosis not present

## 2017-01-22 DIAGNOSIS — Z86010 Personal history of colonic polyps: Secondary | ICD-10-CM | POA: Insufficient documentation

## 2017-01-22 DIAGNOSIS — F329 Major depressive disorder, single episode, unspecified: Secondary | ICD-10-CM | POA: Insufficient documentation

## 2017-01-22 DIAGNOSIS — Z8719 Personal history of other diseases of the digestive system: Secondary | ICD-10-CM | POA: Insufficient documentation

## 2017-01-22 DIAGNOSIS — Z79890 Hormone replacement therapy (postmenopausal): Secondary | ICD-10-CM | POA: Insufficient documentation

## 2017-01-22 DIAGNOSIS — M4800 Spinal stenosis, site unspecified: Secondary | ICD-10-CM | POA: Insufficient documentation

## 2017-01-22 DIAGNOSIS — Z79899 Other long term (current) drug therapy: Secondary | ICD-10-CM | POA: Insufficient documentation

## 2017-01-22 DIAGNOSIS — G709 Myoneural disorder, unspecified: Secondary | ICD-10-CM | POA: Insufficient documentation

## 2017-01-22 DIAGNOSIS — D122 Benign neoplasm of ascending colon: Secondary | ICD-10-CM | POA: Insufficient documentation

## 2017-01-22 DIAGNOSIS — M1990 Unspecified osteoarthritis, unspecified site: Secondary | ICD-10-CM | POA: Insufficient documentation

## 2017-01-22 HISTORY — DX: Chronic obstructive pulmonary disease, unspecified: J44.9

## 2017-01-22 HISTORY — PX: COLONOSCOPY WITH PROPOFOL: SHX5780

## 2017-01-22 LAB — SURGICAL PATHOLOGY

## 2017-01-22 LAB — GLUCOSE, CAPILLARY: Glucose-Capillary: 192 mg/dL — ABNORMAL HIGH (ref 65–99)

## 2017-01-22 SURGERY — COLONOSCOPY WITH PROPOFOL
Anesthesia: General

## 2017-01-22 MED ORDER — PHENYLEPHRINE HCL 10 MG/ML IJ SOLN
INTRAMUSCULAR | Status: DC | PRN
  Administered 2017-01-22 (×3): 50 ug via INTRAVENOUS

## 2017-01-22 MED ORDER — PROPOFOL 500 MG/50ML IV EMUL
INTRAVENOUS | Status: AC
  Filled 2017-01-22: qty 50

## 2017-01-22 MED ORDER — SODIUM CHLORIDE 0.9 % IV SOLN: INTRAVENOUS | Status: DC

## 2017-01-22 MED ORDER — PROPOFOL 500 MG/50ML IV EMUL
INTRAVENOUS | Status: DC | PRN
  Administered 2017-01-22: 100 ug/kg/min via INTRAVENOUS

## 2017-01-22 MED ORDER — SODIUM CHLORIDE 0.9 % IV SOLN
INTRAVENOUS | Status: DC
  Administered 2017-01-22: 1000 mL via INTRAVENOUS

## 2017-01-22 MED ORDER — FENTANYL CITRATE (PF) 100 MCG/2ML IJ SOLN
INTRAMUSCULAR | Status: AC
  Filled 2017-01-22: qty 2

## 2017-01-22 MED ORDER — SODIUM CHLORIDE 0.9 % IV SOLN
1.0000 g | Freq: Once | INTRAVENOUS | Status: AC
  Administered 2017-01-22: 1 g via INTRAVENOUS

## 2017-01-22 MED ORDER — FENTANYL CITRATE (PF) 100 MCG/2ML IJ SOLN
INTRAMUSCULAR | Status: DC | PRN
  Administered 2017-01-22: 50 ug via INTRAVENOUS
  Administered 2017-01-22 (×2): 25 ug via INTRAVENOUS

## 2017-01-22 MED ORDER — PROPOFOL 10 MG/ML IV BOLUS
INTRAVENOUS | Status: AC
  Filled 2017-01-22: qty 20

## 2017-01-22 MED ORDER — SODIUM CHLORIDE 0.9 % IV SOLN
INTRAVENOUS | Status: AC
  Administered 2017-01-22: 1 g via INTRAVENOUS
  Filled 2017-01-22: qty 1000

## 2017-01-22 NOTE — H&P (Signed)
Outpatient short stay form Pre-procedure 01/22/2017 9:14 AM Lollie Sails MD  Primary Physician: Dr. Miguel Aschoff  Reason for visit:  Colonoscopy  History of present illness:  Patient is a 83 year old female presenting today as above. She has a personal history of adenomatous colon polyps her last colonoscopy being actually about a year and a half ago. At that time she had multiple colon polyps all of these were adenomatous. She's had similar results on previous colonoscopies. She does have some history of chronic intermittent diarrhea. She tolerated her prep well. She takes no aspirin or blood thinning agents.    Current Facility-Administered Medications:  .  0.9 %  sodium chloride infusion, , Intravenous, Continuous, Lollie Sails, MD, Last Rate: 20 mL/hr at 01/22/17 0853, 1,000 mL at 01/22/17 0853 .  0.9 %  sodium chloride infusion, , Intravenous, Continuous, Lollie Sails, MD .  ampicillin (OMNIPEN) 1 g in sodium chloride 0.9 % 50 mL IVPB, 1 g, Intravenous, Once, Lollie Sails, MD  Prescriptions Prior to Admission  Medication Sig Dispense Refill Last Dose  . acetaminophen (TYLENOL) 500 MG tablet Take 1,000 mg by mouth every 6 (six) hours as needed. Pain    Past Week at Unknown time  . albuterol (PROVENTIL) (2.5 MG/3ML) 0.083% nebulizer solution Take 3 mLs (2.5 mg total) by nebulization every 6 (six) hours as needed for wheezing or shortness of breath. 150 mL 12 Past Week at Unknown time  . amLODipine (NORVASC) 5 MG tablet TAKE 1 TABLET BY MOUTH EVERY DAY FOR HYPERTENSION 30 tablet 12 01/21/2017 at Unknown time  . atorvastatin (LIPITOR) 40 MG tablet TAKE 1 TABLET BY MOUTH EVERY DAY 90 tablet 3 01/21/2017 at Unknown time  . cetirizine (ZYRTEC) 10 MG tablet Take 10 mg by mouth at bedtime.    Past Week at Unknown time  . DULoxetine (CYMBALTA) 20 MG capsule Take 1 capsule (20 mg total) by mouth daily. 30 capsule 12 Past Week at Unknown time  . esomeprazole (NEXIUM) 40  MG capsule Take 40 mg by mouth daily at 12 noon.   01/21/2017 at Unknown time  . glucose blood (CONTOUR NEXT TEST) test strip Check sugar once daily  DX E11.9 100 each 3 01/21/2017 at Unknown time  . hydrOXYzine (ATARAX/VISTARIL) 10 MG tablet TAKE 1 TO 2 TABLETS BY MOUTH EVERY 6 HOURS AS NEEDED 100 tablet 5 Past Week at Unknown time  . levothyroxine (SYNTHROID, LEVOTHROID) 100 MCG tablet TAKE 1 TABLET(100 MCG) BY MOUTH DAILY BEFORE BREAKFAST 90 tablet 3 01/21/2017 at Unknown time  . lisinopril (PRINIVIL,ZESTRIL) 10 MG tablet TAKE 1 TABLET BY MOUTH EVERY NIGHT AT BEDTIME FOR HYPERTENSION 30 tablet 11 01/21/2017 at Unknown time  . metFORMIN (GLUCOPHAGE) 1000 MG tablet Take 1 tablet (1,000 mg total) by mouth daily with supper. 90 tablet 3 01/21/2017 at Unknown time  . omeprazole (PRILOSEC) 20 MG capsule Take 20 mg by mouth daily.   Past Week at Unknown time  . tiotropium (SPIRIVA) 18 MCG inhalation capsule Place 18 mcg into inhaler and inhale daily.   Past Week at Unknown time     Allergies  Allergen Reactions  . Oxycodone Other (See Comments)    Mental status change  . Prednisone     swelling, bruising.     Past Medical History:  Diagnosis Date  . Arthritis    spinal stenosis  . COPD (chronic obstructive pulmonary disease) ([place])   . Gastric ulcer 4/12   treated with Prolisec- states no problems now  .  Hyperlipidemia   . Hypertension    PCP Dr Miguel Aschoff   [place]  . Hypothyroidism   . Neuromuscular disorder ([place])    slight numbness right toes- comes and goes  . Peripheral vascular disease (HCC)    varicose veins left leg  . Pneumonia   . Seasonal allergies   . Shortness of breath   . Skin cancer    multiple from face    Review of systems:      Physical Exam    Heart and lungs: Regular rate and rhythm without rub or gallop, lungs are bilaterally clear.    HEENT: Normocephalic atraumatic eyes are anicteric    Other:     Pertinant exam for procedure: Soft  nontender nondistended bowel sounds positive normoactive.    Planned proceedures: Colonoscopy and indicated procedures. I have discussed the risks benefits and complications of procedures to include not limited to bleeding, infection, perforation and the risk of sedation and the patient wishes to proceed.    Lollie Sails, MD Gastroenterology 01/22/2017  9:14 AM

## 2017-01-22 NOTE — Anesthesia Preprocedure Evaluation (Signed)
Anesthesia Evaluation  Patient identified by MRN, date of birth, ID band Patient awake    Reviewed: Allergy & Precautions, NPO status , Patient's Chart, lab work & pertinent test results  History of Anesthesia Complications Negative for: history of anesthetic complications  Airway Mallampati: III       Dental   Pulmonary COPD,  COPD inhaler, former smoker,           Cardiovascular hypertension, Pt. on medications + Peripheral Vascular Disease  (-) Past MI and (-) CHF (-) dysrhythmias (-) Valvular Problems/Murmurs     Neuro/Psych neg Seizures Depression    GI/Hepatic Neg liver ROS, PUD, GERD  Medicated,  Endo/Other  diabetes, Type 2, Oral Hypoglycemic AgentsHypothyroidism   Renal/GU negative Renal ROS     Musculoskeletal   Abdominal   Peds  Hematology  (+) anemia ,   Anesthesia Other Findings   Reproductive/Obstetrics                            Anesthesia Physical Anesthesia Plan  ASA: III  Anesthesia Plan: General   Post-op Pain Management:    Induction: Intravenous  PONV Risk Score and Plan:   Airway Management Planned:   Additional Equipment:   Intra-op Plan:   Post-operative Plan:   Informed Consent: I have reviewed the patients History and Physical, chart, labs and discussed the procedure including the risks, benefits and alternatives for the proposed anesthesia with the patient or authorized representative who has indicated his/her understanding and acceptance.     Plan Discussed with:   Anesthesia Plan Comments:         Anesthesia Quick Evaluation

## 2017-01-22 NOTE — Anesthesia Postprocedure Evaluation (Signed)
Anesthesia Post Note  Patient: Kathryn [name]  Procedure(s) Performed: COLONOSCOPY WITH PROPOFOL (N/A )  Patient location during evaluation: Endoscopy Anesthesia Type: General Level of consciousness: awake and alert Pain management: pain level controlled Vital Signs Assessment: post-procedure vital signs reviewed and stable Respiratory status: spontaneous breathing and respiratory function stable Cardiovascular status: stable Anesthetic complications: no     Last Vitals:  Vitals:   01/22/17 0840 01/22/17 1121  BP: 120/65 102/66  Pulse: (!) 101 (!) 54  Resp: 16 (!) 25  Temp: (!) 35.9 C (!) 36.1 C  SpO2: 97% 96%    Last Pain:  Vitals:   01/22/17 1121  TempSrc:   PainSc: 0-No pain                 [name]

## 2017-01-22 NOTE — Transfer of Care (Signed)
  Immediate Anesthesia Transfer of Care Note  Patient: Kathryn [name]  Procedure(s) Performed: COLONOSCOPY WITH PROPOFOL (N/A )  Patient Location: PACU  Anesthesia Type:General  Level of Consciousness: awake and sedated  Airway & Oxygen Therapy: Patient Spontanous Breathing and Patient connected to nasal cannula oxygen  Post-op Assessment: Report given to RN and Post -op Vital signs reviewed and stable  Post vital signs: Reviewed and stable  Last Vitals:  Vitals:   01/22/17 0840  BP: 120/65  Pulse: (!) 101  Resp: 16  Temp: (!) 35.9 C  SpO2: 97%    Last Pain:  Vitals:   01/22/17 0840  TempSrc: Tympanic  PainSc: 3          Complications: No apparent anesthesia complications

## 2017-01-22 NOTE — Op Note (Signed)
[facility]astroenterology Patient Name: Kathryn [name] Procedure Date: 01/22/2017 9:47 AM MRN: [id number] Account #: [id number] Date of Birth: 22-Jul-1933 Admit Type: Outpatient Age: 83 Room: [facility] ENDO ROOM 3 Gender: Female Note Status: Finalized Procedure:            Colonoscopy Indications:          High risk colon cancer surveillance: Personal history                        of colonic polyps, Personal history of colonic polyps Providers:            Lollie Sails, MD Referring MD:         Janine Ores. Rosanna Randy, MD (Referring MD) Medicines:            Monitored Anesthesia Care Complications:        No immediate complications. Procedure:            Pre-Anesthesia Assessment:                       - ASA Grade Assessment: III - A patient with severe                        systemic disease.                       After obtaining informed consent, the colonoscope was                        passed under direct vision. Throughout the procedure,                        the patient's blood pressure, pulse, and oxygen                        saturations were monitored continuously. The                        Colonoscope was introduced through the anus and                        advanced to the the cecum, identified by appendiceal                        orifice and ileocecal valve. The colonoscopy was                        unusually difficult. The patient tolerated the                        procedure well. The quality of the bowel preparation                        was good except the ascending colon was fair. Findings:      Multiple small-mouthed diverticula were found in the sigmoid colon and       descending colon.      Four sessile polyps were found in the rectum. The polyps were 1 to 2 mm       in size. These polyps were removed with a cold biopsy forceps. Resection       and retrieval were complete.  Two sessile polyps were found in the transverse colon. The  polyps were 2       to 3 mm in size. These polyps were removed with a cold biopsy forceps.       Resection and retrieval were complete.      Seven sessile polyps were found in the ascending colon. The polyps were       4 to 10 mm in size. These polyps were removed with a cold snare.       Resection and retrieval were complete.      Two sessile polyps were found in the proximal ascending colon and cecum.       The polyps were 2 to 3 mm in size. These polyps were removed with a cold       biopsy forceps. Resection and retrieval were complete.      A 23 mm polyp was found in the cecum. The polyp was multi-lobulated and       sessile. The polyp was removed with a cold biopsy forceps, The polyp was       removed with a cold snare, The polyp was removed with a saline       injection-lift technique using a cold snare and The polyp was removed       with a piecemeal technique using a cold snare. Resection and retrieval       were complete using a suction (via the working channel). Two hemostatic       clips were successfully placed (MR conditional).      The digital rectal exam was normal. Impression:           - Diverticulosis in the sigmoid colon and in the                        descending colon.                       - Four 1 to 2 mm polyps in the rectum, removed with a                        cold biopsy forceps. Resected and retrieved.                       - Two 2 to 3 mm polyps in the transverse colon, removed                        with a cold biopsy forceps. Resected and retrieved.                       - Seven 4 to 10 mm polyps in the ascending colon,                        removed with a cold snare. Resected and retrieved.                       - Two 2 to 3 mm polyps in the proximal ascending colon                        and in the cecum, removed with a cold biopsy forceps.  Resected and retrieved.                       - One 23 mm polyp in the cecum, removed with  a cold                        biopsy forceps, removed with a cold snare, removed                        using injection-lift and a cold snare and removed                        piecemeal using a cold snare. Resected and retrieved.                        Clips (MR conditional) were placed. Recommendation:       - Full liquid diet today.                       - Full liquid diet for 1 day, then advance as tolerated                        to low residue diet for 3 days. Procedure Code(s):    --- Professional ---                       [phone], Colonoscopy, flexible; with removal of tumor(s),                        polyp(s), or other lesion(s) by snare technique                       45380, 59, Colonoscopy, flexible; with biopsy, single                        or multiple                       45381, Colonoscopy, flexible; with directed submucosal                        injection(s), any substance Diagnosis Code(s):    --- Professional ---                       Z86.010, Personal history of colonic polyps                       K62.1, Rectal polyp                       D12.3, Benign neoplasm of transverse colon (hepatic                        flexure or splenic flexure)                       D12.2, Benign neoplasm of ascending colon                       D12.0, Benign neoplasm of cecum  K57.30, Diverticulosis of large intestine without                        perforation or abscess without bleeding CPT copyright 2016 American Medical Association. All rights reserved. The codes documented in this report are preliminary and upon coder review may  be revised to meet current compliance requirements. Lollie Sails, MD 01/22/2017 11:23:33 AM This report has been signed electronically. Number of Addenda: 0 Note Initiated On: 01/22/2017 9:47 AM Scope Withdrawal Time: 0 hours 42 minutes 41 seconds  Total Procedure Duration: 1 hour 16 minutes 14 seconds       [facility]

## 2017-01-22 NOTE — Anesthesia Post-op Follow-up Note (Signed)
Anesthesia QCDR form completed.        

## 2017-01-22 NOTE — Anesthesia Procedure Notes (Signed)
Performed by: Vaughan Sine Pre-anesthesia Checklist: Patient identified, Emergency Drugs available, Suction available, Patient being monitored and Timeout performed Patient Re-evaluated:Patient Re-evaluated prior to induction Oxygen Delivery Method: Nasal cannula Preoxygenation: Pre-oxygenation with 100% oxygen Placement Confirmation: CO2 detector and positive ETCO2

## 2017-01-23 ENCOUNTER — Encounter: Payer: Self-pay | Admitting: Gastroenterology

## 2017-02-19 ENCOUNTER — Ambulatory Visit (INDEPENDENT_AMBULATORY_CARE_PROVIDER_SITE_OTHER): Payer: Medicare Other | Admitting: Family Medicine

## 2017-02-19 ENCOUNTER — Encounter: Payer: Self-pay | Admitting: Family Medicine

## 2017-02-19 VITALS — BP 122/60 | HR 76 | Temp 97.7°F | Resp 18 | Wt 191.0 lb

## 2017-02-19 DIAGNOSIS — E119 Type 2 diabetes mellitus without complications: Secondary | ICD-10-CM

## 2017-02-19 DIAGNOSIS — I10 Essential (primary) hypertension: Secondary | ICD-10-CM

## 2017-02-19 LAB — POCT GLYCOSYLATED HEMOGLOBIN (HGB A1C): Hemoglobin A1C: 7.7

## 2017-02-19 NOTE — Progress Notes (Signed)
Patient: Kathryn [name] Female    DOB: 1933/08/08   83 y.o.   MRN: [id number] Visit Date: 02/19/2017  Today's Provider: [name], MD   Chief Complaint  Patient presents with  . Diabetes   Subjective:    HPI  Diabetes Mellitus Type II, Follow-up:   Lab Results  Component Value Date   HGBA1C 9.1 (H) 11/15/2016   HGBA1C 11.6 (H) 08/08/2016    Last seen for diabetes 4 months ago.  Management since then includes none. She reports good compliance with treatment. She is not having side effects.  Home blood sugar records: 150's in the mornings  Episodes of hypoglycemia? no   Current Insulin Regimen: n/a Most Recent Eye Exam: before 10/2016 Current exercise: walking   Pt reports that she has lost about 10 pounds.    Pertinent Labs:    Component Value Date/Time   CHOL 201 (H) 08/08/2016 0859   TRIG 180 (H) 08/08/2016 0859   HDL 56 08/08/2016 0859   LDLCALC 109 (H) 08/08/2016 0859   CREATININE 0.81 08/08/2016 0859   CREATININE 0.85 07/09/2014 0529    Wt Readings from Last 3 Encounters:  02/19/17 191 lb (86.6 kg)  01/22/17 200 lb (90.7 kg)  11/15/16 195 lb (88.5 kg)    ------------------------------------------------------------------------      Allergies  Allergen Reactions  . Oxycodone Other (See Comments)    Mental status change  . Prednisone     swelling, bruising.     Current Outpatient Medications:  .  acetaminophen (TYLENOL) 500 MG tablet, Take 1,000 mg by mouth every 6 (six) hours as needed. Pain , Disp: , Rfl:  .  amLODipine (NORVASC) 5 MG tablet, TAKE 1 TABLET BY MOUTH EVERY DAY FOR HYPERTENSION, Disp: 30 tablet, Rfl: 12 .  atorvastatin (LIPITOR) 40 MG tablet, TAKE 1 TABLET BY MOUTH EVERY DAY, Disp: 90 tablet, Rfl: 3 .  cetirizine (ZYRTEC) 10 MG tablet, Take 10 mg by mouth at bedtime. , Disp: , Rfl:  .  esomeprazole (NEXIUM) 40 MG capsule, Take 40 mg by mouth daily at 12 noon., Disp: , Rfl:  .  glucose blood (CONTOUR NEXT TEST)  test strip, Check sugar once daily  DX E11.9, Disp: 100 each, Rfl: 3 .  levothyroxine (SYNTHROID, LEVOTHROID) 100 MCG tablet, TAKE 1 TABLET(100 MCG) BY MOUTH DAILY BEFORE BREAKFAST, Disp: 90 tablet, Rfl: 3 .  lisinopril (PRINIVIL,ZESTRIL) 10 MG tablet, TAKE 1 TABLET BY MOUTH EVERY NIGHT AT BEDTIME FOR HYPERTENSION, Disp: 30 tablet, Rfl: 11 .  metFORMIN (GLUCOPHAGE) 1000 MG tablet, Take 1 tablet (1,000 mg total) by mouth daily with supper., Disp: 90 tablet, Rfl: 3 .  omeprazole (PRILOSEC) 20 MG capsule, Take 20 mg by mouth daily., Disp: , Rfl:  .  albuterol (PROVENTIL) (2.5 MG/3ML) 0.083% nebulizer solution, Take 3 mLs (2.5 mg total) by nebulization every 6 (six) hours as needed for wheezing or shortness of breath. (Patient not taking: Reported on 02/19/2017), Disp: 150 mL, Rfl: 12 .  DULoxetine (CYMBALTA) 20 MG capsule, Take 1 capsule (20 mg total) by mouth daily. (Patient not taking: Reported on 02/19/2017), Disp: 30 capsule, Rfl: 12 .  hydrOXYzine (ATARAX/VISTARIL) 10 MG tablet, TAKE 1 TO 2 TABLETS BY MOUTH EVERY 6 HOURS AS NEEDED (Patient not taking: Reported on 02/19/2017), Disp: 100 tablet, Rfl: 5 .  tiotropium (SPIRIVA) 18 MCG inhalation capsule, Place 18 mcg into inhaler and inhale daily., Disp: , Rfl:   Review of Systems  Constitutional: Negative.  HENT: Negative.   Eyes: Negative.   Respiratory: Negative.   Cardiovascular: Negative.   Gastrointestinal: Negative.   Endocrine: Negative.   Genitourinary: Negative.   Musculoskeletal: Negative.   Skin: Negative.   Allergic/Immunologic: Negative.   Neurological: Negative.   Hematological: Negative.   Psychiatric/Behavioral: Negative.     Social History   Tobacco Use  . Smoking status: Former Smoker    Packs/day: 0.50    Years: 50.00    Pack years: 25.00    Types: Cigarettes    Last attempt to quit: 05/16/2004    Years since quitting: 12.7  . Smokeless tobacco: Never Used  Substance Use Topics  . Alcohol use: Yes    Comment:  socially   Objective:   BP 122/60 (BP Location: Left Arm, Patient Position: Sitting, Cuff Size: Normal)   Pulse 76   Temp 97.7 F (36.5 C) (Oral)   Resp 18   Wt 191 lb (86.6 kg)   BMI 31.78 kg/m  Vitals:   02/19/17 1512  BP: 122/60  Pulse: 76  Resp: 18  Temp: 97.7 F (36.5 C)  TempSrc: Oral  Weight: 191 lb (86.6 kg)     Physical Exam  Constitutional: She is oriented to person, place, and time. She appears well-developed and well-nourished.  Eyes: Conjunctivae and EOM are normal. Pupils are equal, round, and reactive to light.  Neck: Normal range of motion. Neck supple.  Cardiovascular: Normal rate, regular rhythm, normal heart sounds and intact distal pulses.  Pulmonary/Chest: Effort normal and breath sounds normal.  Musculoskeletal: Normal range of motion.  Neurological: She is alert and oriented to person, place, and time. She has normal reflexes.  Skin: Skin is warm and dry.  Psychiatric: She has a normal mood and affect. Her behavior is normal. Judgment and thought content normal.        Assessment & Plan:     1. Essential hypertension   2. Type 2 diabetes mellitus without complication, without long-term current use of insulin (HCC)  - POCT HgB A1C 7.7 today. Much improved. Follow up in 4-5 months.   3.OA/DDD      HPI, Exam, and A&P Transcribed under the direction and in the presence of Richard L. Cranford Mon, MD  Electronically Signed: Katina Dung, [place], MD  [facility]

## 2017-02-28 DIAGNOSIS — M19012 Primary osteoarthritis, left shoulder: Secondary | ICD-10-CM | POA: Diagnosis not present

## 2017-02-28 DIAGNOSIS — M7582 Other shoulder lesions, left shoulder: Secondary | ICD-10-CM | POA: Diagnosis not present

## 2017-03-14 ENCOUNTER — Other Ambulatory Visit: Payer: Self-pay | Admitting: Family Medicine

## 2017-04-24 ENCOUNTER — Other Ambulatory Visit: Payer: Self-pay | Admitting: Family Medicine

## 2017-04-24 DIAGNOSIS — Z1231 Encounter for screening mammogram for malignant neoplasm of breast: Secondary | ICD-10-CM

## 2017-05-06 ENCOUNTER — Ambulatory Visit
Admission: RE | Admit: 2017-05-06 | Discharge: 2017-05-06 | Disposition: A | Discharge status: Home / Self Care | Payer: Medicare Other | Source: Ambulatory Visit | Attending: Family Medicine | Admitting: Family Medicine

## 2017-05-06 DIAGNOSIS — Z1231 Encounter for screening mammogram for malignant neoplasm of breast: Secondary | ICD-10-CM | POA: Insufficient documentation

## 2017-05-26 ENCOUNTER — Other Ambulatory Visit: Payer: Self-pay | Admitting: Family Medicine

## 2017-05-26 ENCOUNTER — Other Ambulatory Visit: Payer: Self-pay | Admitting: Physician Assistant

## 2017-06-10 DIAGNOSIS — M5416 Radiculopathy, lumbar region: Secondary | ICD-10-CM | POA: Diagnosis not present

## 2017-06-10 DIAGNOSIS — M48062 Spinal stenosis, lumbar region with neurogenic claudication: Secondary | ICD-10-CM | POA: Diagnosis not present

## 2017-06-10 DIAGNOSIS — M5136 Other intervertebral disc degeneration, lumbar region: Secondary | ICD-10-CM | POA: Diagnosis not present

## 2017-06-24 ENCOUNTER — Other Ambulatory Visit: Payer: Self-pay | Admitting: Family Medicine

## 2017-07-16 DIAGNOSIS — M5136 Other intervertebral disc degeneration, lumbar region: Secondary | ICD-10-CM | POA: Diagnosis not present

## 2017-07-16 DIAGNOSIS — M48062 Spinal stenosis, lumbar region with neurogenic claudication: Secondary | ICD-10-CM | POA: Diagnosis not present

## 2017-07-16 DIAGNOSIS — M5416 Radiculopathy, lumbar region: Secondary | ICD-10-CM | POA: Diagnosis not present

## 2017-07-23 ENCOUNTER — Ambulatory Visit (INDEPENDENT_AMBULATORY_CARE_PROVIDER_SITE_OTHER): Payer: Medicare Other | Admitting: Family Medicine

## 2017-07-23 ENCOUNTER — Encounter: Payer: Self-pay | Admitting: Family Medicine

## 2017-07-23 VITALS — BP 138/68 | HR 92 | Temp 98.0°F | Resp 16 | Wt 188.0 lb

## 2017-07-23 DIAGNOSIS — I10 Essential (primary) hypertension: Secondary | ICD-10-CM | POA: Diagnosis not present

## 2017-07-23 DIAGNOSIS — E119 Type 2 diabetes mellitus without complications: Secondary | ICD-10-CM

## 2017-07-23 DIAGNOSIS — M1990 Unspecified osteoarthritis, unspecified site: Secondary | ICD-10-CM

## 2017-07-23 DIAGNOSIS — E7800 Pure hypercholesterolemia, unspecified: Secondary | ICD-10-CM

## 2017-07-23 NOTE — Progress Notes (Signed)
Patient: Kathryn [name] Female    DOB: 1933-07-19   84 y.o.   MRN: [id number] Visit Date: 07/23/2017  Today's Provider: [name], MD   Chief Complaint  Patient presents with  . Hypertension   Subjective:    HPI  Hypertension, follow-up:  BP Readings from Last 3 Encounters:  07/23/17 138/68  02/19/17 122/60  01/22/17 (!) 150/91    She was last seen for hypertension 5 months ago.  BP at that visit was 138/68. Management since that visit includes none. She reports good compliance with treatment. She is not having side effects.  She is not exercising. She is adherent to low salt diet.   Outside blood pressures are not being checked. Patient denies chest pain, chest pressure/discomfort, claudication, dyspnea, exertional chest pressure/discomfort, fatigue, irregular heart beat, lower extremity edema, near-syncope, orthopnea, palpitations, paroxysmal nocturnal dyspnea, syncope and tachypnea.    Wt Readings from Last 3 Encounters:  07/23/17 188 lb (85.3 kg)  02/19/17 191 lb (86.6 kg)  01/22/17 200 lb (90.7 kg)   ------------------------------------------------------------------------  Diabetes Mellitus Type II, Follow-up:   Lab Results  Component Value Date   HGBA1C 7.7 02/19/2017   HGBA1C 9.1 (H) 11/15/2016   HGBA1C 11.6 (H) 08/08/2016    Last seen for diabetes 5 months ago.  Management since then includes none. She reports good compliance with treatment. She is not having side effects.  Home blood sugar records:140's   Pertinent Labs:    Component Value Date/Time   CHOL 201 (H) 08/08/2016 0859   TRIG 180 (H) 08/08/2016 0859   HDL 56 08/08/2016 0859   LDLCALC 109 (H) 08/08/2016 0859   CREATININE 0.81 08/08/2016 0859   CREATININE 0.85 07/09/2014 0529    Wt Readings from Last 3 Encounters:  07/23/17 188 lb (85.3 kg)  02/19/17 191 lb (86.6 kg)  01/22/17 200 lb (90.7 kg)     ------------------------------------------------------------------------       Allergies  Allergen Reactions  . Oxycodone Other (See Comments)    Mental status change  . Prednisone     swelling, bruising.     Current Outpatient Medications:  .  acetaminophen (TYLENOL) 500 MG tablet, Take 1,000 mg by mouth every 6 (six) hours as needed. Pain , Disp: , Rfl:  .  albuterol (PROVENTIL) (2.5 MG/3ML) 0.083% nebulizer solution, Take 3 mLs (2.5 mg total) by nebulization every 6 (six) hours as needed for wheezing or shortness of breath., Disp: 150 mL, Rfl: 12 .  amLODipine (NORVASC) 5 MG tablet, TAKE 1 TABLET BY MOUTH EVERY DAY FOR HYPERTENSION, Disp: 30 tablet, Rfl: 12 .  atorvastatin (LIPITOR) 40 MG tablet, TAKE 1 TABLET BY MOUTH EVERY DAY, Disp: 90 tablet, Rfl: 3 .  cetirizine (ZYRTEC) 10 MG tablet, Take 10 mg by mouth at bedtime. , Disp: , Rfl:  .  DULoxetine (CYMBALTA) 20 MG capsule, TAKE 1 CAPSULE(20 MG) BY MOUTH DAILY, Disp: 30 capsule, Rfl: 3 .  esomeprazole (NEXIUM) 40 MG capsule, Take 40 mg by mouth daily at 12 noon., Disp: , Rfl:  .  glucose blood (CONTOUR NEXT TEST) test strip, Check sugar once daily  DX E11.9, Disp: 100 each, Rfl: 3 .  hydrOXYzine (ATARAX/VISTARIL) 10 MG tablet, TAKE 1 TO 2 TABLETS BY MOUTH EVERY 6 HOURS AS NEEDED, Disp: 100 tablet, Rfl: 4 .  levothyroxine (SYNTHROID, LEVOTHROID) 100 MCG tablet, TAKE 1 TABLET(100 MCG) BY MOUTH DAILY BEFORE BREAKFAST, Disp: 90 tablet, Rfl: 3 .  lisinopril (PRINIVIL,ZESTRIL) 10 MG tablet,  TAKE 1 TABLET BY MOUTH EVERY NIGHT AT BEDTIME FOR HYPERTENSION, Disp: 30 tablet, Rfl: 11 .  metFORMIN (GLUCOPHAGE) 1000 MG tablet, Take 1 tablet (1,000 mg total) by mouth daily with supper., Disp: 90 tablet, Rfl: 3 .  omeprazole (PRILOSEC) 20 MG capsule, Take 20 mg by mouth daily., Disp: , Rfl:  .  tiotropium (SPIRIVA) 18 MCG inhalation capsule, Place 18 mcg into inhaler and inhale daily., Disp: , Rfl:   Review of Systems  Constitutional:  Negative.   HENT: Negative.   Eyes: Negative.   Respiratory: Negative.   Cardiovascular: Negative.   Gastrointestinal: Negative.   Endocrine: Negative.   Genitourinary: Negative.   Musculoskeletal: Positive for arthralgias and back pain.  Skin: Negative.   Allergic/Immunologic: Negative.   Neurological: Negative.   Hematological: Negative.   Psychiatric/Behavioral: Negative.     Social History   Tobacco Use  . Smoking status: Former Smoker    Packs/day: 0.50    Years: 50.00    Pack years: 25.00    Types: Cigarettes    Last attempt to quit: 05/16/2004    Years since quitting: 13.1  . Smokeless tobacco: Never Used  Substance Use Topics  . Alcohol use: Yes    Comment: socially   Objective:   BP 138/68 (BP Location: Left Arm, Patient Position: Sitting, Cuff Size: Normal)   Pulse 92   Temp 98 F (36.7 C) (Oral)   Resp 16   Wt 188 lb (85.3 kg)   SpO2 97%   BMI 31.28 kg/m  Vitals:   07/23/17 1333  BP: 138/68  Pulse: 92  Resp: 16  Temp: 98 F (36.7 C)  TempSrc: Oral  SpO2: 97%  Weight: 188 lb (85.3 kg)     Physical Exam  Constitutional: She is oriented to person, place, and time. She appears well-developed and well-nourished.  HENT:  Head: Normocephalic and atraumatic.  Eyes: Pupils are equal, round, and reactive to light. Conjunctivae are normal.  Neck: No thyromegaly present.  Cardiovascular: Normal rate, regular rhythm and normal heart sounds.  Pulmonary/Chest: Effort normal and breath sounds normal.  Abdominal: Soft.  Musculoskeletal: She exhibits no edema.  Lymphadenopathy:    She has no cervical adenopathy.  Neurological: She is alert and oriented to person, place, and time.  Skin: Skin is warm and dry.  Psychiatric: She has a normal mood and affect. Her behavior is normal. Judgment and thought content normal.        Assessment & Plan:     1. Hypercholesteremia  - Lipid panel - Comprehensive metabolic panel  2. Type 2 diabetes mellitus  without complication, without long-term current use of insulin (HCC)  - Hemoglobin A1c  3. Essential hypertension  - CBC with Differential/Platelet - TSH 4.OA/DDD Pt doing well with chronic pain.     I have done the exam and reviewed the above chart and it is accurate to the best of my knowledge. Development worker, community has been used in this note in any air is in the dictation or transcription are unintentional.  [name], MD  [place]

## 2017-07-24 LAB — COMPREHENSIVE METABOLIC PANEL
ALT: 16 IU/L (ref 0–32)
AST: 15 IU/L (ref 0–40)
Albumin/Globulin Ratio: 1.8 (ref 1.2–2.2)
Albumin: 4.6 g/dL (ref 3.5–4.7)
Alkaline Phosphatase: 75 IU/L (ref 39–117)
BUN/Creatinine Ratio: 19 (ref 12–28)
BUN: 15 mg/dL (ref 8–27)
Bilirubin Total: 0.4 mg/dL (ref 0.0–1.2)
CO2: 22 mmol/L (ref 20–29)
Calcium: 10.6 mg/dL — ABNORMAL HIGH (ref 8.7–10.3)
Chloride: 98 mmol/L (ref 96–106)
Creatinine, Ser: 0.78 mg/dL (ref 0.57–1.00)
GFR calc Af Amer: 82 mL/min/{1.73_m2} (ref >59)
GFR calc non Af Amer: 72 mL/min/{1.73_m2} (ref >59)
Globulin, Total: 2.6 g/dL (ref 1.5–4.5)
Glucose: 132 mg/dL — ABNORMAL HIGH (ref 65–99)
Potassium: 4.6 mmol/L (ref 3.5–5.2)
Sodium: 138 mmol/L (ref 134–144)
Total Protein: 7.2 g/dL (ref 6.0–8.5)

## 2017-07-24 LAB — CBC WITH DIFFERENTIAL/PLATELET
Basophils Absolute: 0 10*3/uL (ref 0.0–0.2)
Basos: 0 %
EOS (ABSOLUTE): 0.2 10*3/uL (ref 0.0–0.4)
Eos: 2 %
Hematocrit: 37.2 % (ref 34.0–46.6)
Hemoglobin: 12.1 g/dL (ref 11.1–15.9)
Immature Grans (Abs): 0.1 10*3/uL (ref 0.0–0.1)
Immature Granulocytes: 1 %
Lymphocytes Absolute: 2.5 10*3/uL (ref 0.7–3.1)
Lymphs: 30 %
MCH: 26.5 pg — ABNORMAL LOW (ref 26.6–33.0)
MCHC: 32.5 g/dL (ref 31.5–35.7)
MCV: 82 fL (ref 79–97)
Monocytes Absolute: 0.9 10*3/uL (ref 0.1–0.9)
Monocytes: 11 %
Neutrophils Absolute: 4.7 10*3/uL (ref 1.4–7.0)
Neutrophils: 56 %
Platelets: 353 10*3/uL (ref 150–379)
RBC: 4.56 x10E6/uL (ref 3.77–5.28)
RDW: 14.2 % (ref 12.3–15.4)
WBC: 8.3 10*3/uL (ref 3.4–10.8)

## 2017-07-24 LAB — LIPID PANEL
Chol/HDL Ratio: 3 ratio (ref 0.0–4.4)
Cholesterol, Total: 183 mg/dL (ref 100–199)
HDL: 61 mg/dL (ref >39)
LDL Calculated: 83 mg/dL (ref 0–99)
Triglycerides: 194 mg/dL — ABNORMAL HIGH (ref 0–149)
VLDL Cholesterol Cal: 39 mg/dL (ref 5–40)

## 2017-07-24 LAB — TSH: TSH: 0.518 u[IU]/mL (ref 0.450–4.500)

## 2017-07-24 LAB — HEMOGLOBIN A1C
Est. average glucose Bld gHb Est-mCnc: 174 mg/dL
Hgb A1c MFr Bld: 7.7 % — ABNORMAL HIGH (ref 4.8–5.6)

## 2017-07-26 ENCOUNTER — Telehealth: Payer: Self-pay

## 2017-07-26 NOTE — Telephone Encounter (Signed)
Left message to call back  

## 2017-07-26 NOTE — Telephone Encounter (Signed)
-----   Message from Jerrol Banana., MD sent at 07/26/2017  9:48 AM EDT ----- Labs stable--calciumslightly high--stop all calcium supplements and recheck calcium labs on next OV.

## 2017-07-31 NOTE — Telephone Encounter (Signed)
Pt informed and voiced understanding of results. 

## 2017-08-02 ENCOUNTER — Telehealth: Payer: Self-pay

## 2017-08-02 NOTE — Telephone Encounter (Signed)
Called pt to schedule AWv and pt states she was just in the office recently and would like to wait to schedule at this time. Pt said it was ok to call back in 1 month.  -MM

## 2017-08-08 ENCOUNTER — Telehealth: Payer: Self-pay | Admitting: Family Medicine

## 2017-08-08 NOTE — Telephone Encounter (Signed)
No answer, Left message to return my call for an appt.

## 2017-08-11 ENCOUNTER — Other Ambulatory Visit: Payer: Self-pay | Admitting: Family Medicine

## 2017-08-11 DIAGNOSIS — E119 Type 2 diabetes mellitus without complications: Secondary | ICD-10-CM

## 2017-08-16 DIAGNOSIS — M48062 Spinal stenosis, lumbar region with neurogenic claudication: Secondary | ICD-10-CM | POA: Diagnosis not present

## 2017-08-16 DIAGNOSIS — M5136 Other intervertebral disc degeneration, lumbar region: Secondary | ICD-10-CM | POA: Diagnosis not present

## 2017-08-16 DIAGNOSIS — M5416 Radiculopathy, lumbar region: Secondary | ICD-10-CM | POA: Diagnosis not present

## 2017-08-20 DIAGNOSIS — M5136 Other intervertebral disc degeneration, lumbar region: Secondary | ICD-10-CM | POA: Diagnosis not present

## 2017-08-20 DIAGNOSIS — M5416 Radiculopathy, lumbar region: Secondary | ICD-10-CM | POA: Diagnosis not present

## 2017-08-20 DIAGNOSIS — M48062 Spinal stenosis, lumbar region with neurogenic claudication: Secondary | ICD-10-CM | POA: Diagnosis not present

## 2017-09-05 NOTE — Telephone Encounter (Signed)
LMTCB and schedule AWV.  -MM 

## 2017-10-04 ENCOUNTER — Telehealth: Payer: Self-pay | Admitting: Family Medicine

## 2017-10-04 NOTE — Telephone Encounter (Signed)
I left a message asking the pt to call me back and schedule AWV. VDM (DD)

## 2017-10-11 DIAGNOSIS — M9902 Segmental and somatic dysfunction of thoracic region: Secondary | ICD-10-CM | POA: Diagnosis not present

## 2017-10-11 DIAGNOSIS — M531 Cervicobrachial syndrome: Secondary | ICD-10-CM | POA: Diagnosis not present

## 2017-10-11 DIAGNOSIS — M9901 Segmental and somatic dysfunction of cervical region: Secondary | ICD-10-CM | POA: Diagnosis not present

## 2017-10-11 DIAGNOSIS — S233XXA Sprain of ligaments of thoracic spine, initial encounter: Secondary | ICD-10-CM | POA: Diagnosis not present

## 2017-10-21 NOTE — Telephone Encounter (Signed)
Spoke with pt and she states the wellness visit is "a waste of her time" and she is not interested in scheduling this apt. FYI to PCP! -MM

## 2017-10-22 DIAGNOSIS — Z961 Presence of intraocular lens: Secondary | ICD-10-CM | POA: Diagnosis not present

## 2017-10-22 DIAGNOSIS — H04129 Dry eye syndrome of unspecified lacrimal gland: Secondary | ICD-10-CM | POA: Diagnosis not present

## 2017-10-22 DIAGNOSIS — H401131 Primary open-angle glaucoma, bilateral, mild stage: Secondary | ICD-10-CM | POA: Diagnosis not present

## 2017-10-22 DIAGNOSIS — H26492 Other secondary cataract, left eye: Secondary | ICD-10-CM | POA: Diagnosis not present

## 2017-10-22 LAB — HM DIABETES EYE EXAM

## 2017-11-06 DIAGNOSIS — M19012 Primary osteoarthritis, left shoulder: Secondary | ICD-10-CM | POA: Diagnosis not present

## 2017-11-06 DIAGNOSIS — M7582 Other shoulder lesions, left shoulder: Secondary | ICD-10-CM | POA: Diagnosis not present

## 2017-11-06 DIAGNOSIS — M7581 Other shoulder lesions, right shoulder: Secondary | ICD-10-CM | POA: Diagnosis not present

## 2017-11-06 DIAGNOSIS — M19011 Primary osteoarthritis, right shoulder: Secondary | ICD-10-CM | POA: Diagnosis not present

## 2017-11-12 ENCOUNTER — Other Ambulatory Visit: Payer: Self-pay | Admitting: Family Medicine

## 2017-11-12 DIAGNOSIS — E119 Type 2 diabetes mellitus without complications: Secondary | ICD-10-CM

## 2017-12-11 ENCOUNTER — Other Ambulatory Visit: Payer: Self-pay | Admitting: Family Medicine

## 2017-12-18 DIAGNOSIS — M5416 Radiculopathy, lumbar region: Secondary | ICD-10-CM | POA: Diagnosis not present

## 2017-12-18 DIAGNOSIS — M48062 Spinal stenosis, lumbar region with neurogenic claudication: Secondary | ICD-10-CM | POA: Diagnosis not present

## 2017-12-18 DIAGNOSIS — M5136 Other intervertebral disc degeneration, lumbar region: Secondary | ICD-10-CM | POA: Diagnosis not present

## 2018-01-22 ENCOUNTER — Ambulatory Visit (INDEPENDENT_AMBULATORY_CARE_PROVIDER_SITE_OTHER): Payer: Medicare Other | Admitting: Family Medicine

## 2018-01-22 VITALS — BP 138/80 | HR 101 | Temp 98.1°F | Resp 18 | Wt 192.0 lb

## 2018-01-22 DIAGNOSIS — I10 Essential (primary) hypertension: Secondary | ICD-10-CM

## 2018-01-22 DIAGNOSIS — E119 Type 2 diabetes mellitus without complications: Secondary | ICD-10-CM

## 2018-01-22 DIAGNOSIS — J449 Chronic obstructive pulmonary disease, unspecified: Secondary | ICD-10-CM | POA: Diagnosis not present

## 2018-01-22 DIAGNOSIS — E039 Hypothyroidism, unspecified: Secondary | ICD-10-CM | POA: Diagnosis not present

## 2018-01-22 DIAGNOSIS — M1990 Unspecified osteoarthritis, unspecified site: Secondary | ICD-10-CM

## 2018-01-22 MED ORDER — HYDROCODONE-ACETAMINOPHEN 5-325 MG PO TABS: 1.0000 | ORAL_TABLET | Freq: Four times a day (QID) | ORAL | 0 refills | Status: DC | PRN

## 2018-01-22 MED ORDER — OMEPRAZOLE 20 MG PO CPDR: 20.0000 mg | DELAYED_RELEASE_CAPSULE | Freq: Every day | ORAL | 3 refills | Status: DC

## 2018-01-22 NOTE — Progress Notes (Signed)
Kathryn [name]  MRN: [id number] DOB: 05-Jul-1933  Subjective:  HPI   The patient is an 84 year old female who presents for follow up of chronic health.  She was last seen on 07/23/17. She is the Mother of 2,grandmother of 6 and [name] of 5. Hypertension BP Readings from Last 3 Encounters:  01/22/18 138/80  07/23/17 138/68  02/19/17 122/60   Diabetes-The patient states her home readings have been mostly 120's-140's.  Her last A1C was on 07/23/17 and it was 7.7.  Hypothyroidism Lab Results  Component Value Date   TSH 0.518 07/23/2017   Hypercholesterolemia Lab Results  Component Value Date   CHOL 183 07/23/2017   HDL 61 07/23/2017   LDLCALC 83 07/23/2017   TRIG 194 (H) 07/23/2017   CHOLHDL 3.0 07/23/2017   COPD-the patient states her breathing has been doing great.  She only has problems when she is hurting so bad that she struggles to walk.  On those days she has a little more trouble.   Clinical depression-The patient states she feels she is doing well overall.  ON days that she hurts and can't do much she gets a little down.    Osteoarthritis-Patient states that this is her biggest problem now.  She has pain every day and states she takes more than she should.  She states she take Tylenol 500 mg 2 every 4 hours.This slowly getting worse for pt.  Calcium-On her labs done in April she had a slightly elevated Calcium level.  Normal range is 8.7-10.3 and the patient was 10.6.  Patient Active Problem List   Diagnosis Date Noted  . Diabetes mellitus type 2, uncomplicated ([place]) 41/63/8453  . COPD, mild ([place]) 09/17/2014  . Clinical depression 09/17/2014  . Elevated platelet count 09/17/2014  . Elevated erythrocyte sedimentation rate 09/17/2014  . History of tobacco use 09/17/2014  . Hypercholesteremia 09/17/2014  . BP (high blood pressure) 09/17/2014  . Adult hypothyroidism 09/17/2014  . Cannot sleep 09/17/2014  . Lumbar disc disease with radiculopathy 09/17/2014  .  Arthritis, degenerative 09/17/2014  . Excess weight 09/17/2014  . Vertebrobasilar circulation transient ischemic attack 09/17/2014  . Lung nodule, multiple 09/17/2014  . Basilar artery insufficiency 09/17/2014  . Neuritis or radiculitis due to rupture of lumbar intervertebral disc 08/25/2013  . Degenerative arthritis of lumbar spine 08/25/2013  . Lumbar canal stenosis 08/25/2013  . Smoker 07/15/2012  . Back pain 07/15/2012  . SOB (shortness of breath) 07/15/2012  . Hyperlipidemia 07/15/2012  . Essential hypertension 07/15/2012    Past Medical History:  Diagnosis Date  . Arthritis    spinal stenosis  . COPD (chronic obstructive pulmonary disease) ([place])   . Gastric ulcer 4/12   treated with Prolisec- states no problems now  . Hyperlipidemia   . Hypertension    PCP Dr Miguel Aschoff   [place]  . Hypothyroidism   . Neuromuscular disorder ([place])    slight numbness right toes- comes and goes  . Peripheral vascular disease (HCC)    varicose veins left leg  . Pneumonia   . Seasonal allergies   . Shortness of breath   . Skin cancer    multiple from face    Social History   Socioeconomic History  . Marital status: Widowed    Spouse name: Not on file  . Number of children: Not on file  . Years of education: Not on file  . Highest education level: Not on file  Occupational History  . Not on file  Social Needs  . Financial resource strain: Not on file  . Food insecurity:    Worry: Not on file    Inability: Not on file  . Transportation needs:    Medical: Not on file    Non-medical: Not on file  Tobacco Use  . Smoking status: Former Smoker    Packs/day: 0.50    Years: 50.00    Pack years: 25.00    Types: Cigarettes    Last attempt to quit: 05/16/2004    Years since quitting: 13.6  . Smokeless tobacco: Never Used  Substance and Sexual Activity  . Alcohol use: Yes    Comment: socially  . Drug use: No  . Sexual activity: Yes    Birth control/protection: None    Lifestyle  . Physical activity:    Days per week: Not on file    Minutes per session: Not on file  . Stress: Not on file  Relationships  . Social connections:    Talks on phone: Not on file    Gets together: Not on file    Attends religious service: Not on file    Active member of club or organization: Not on file    Attends meetings of clubs or organizations: Not on file    Relationship status: Not on file  . Intimate partner violence:    Fear of current or ex partner: Not on file    Emotionally abused: Not on file    Physically abused: Not on file    Forced sexual activity: Not on file  Other Topics Concern  . Not on file  Social History Narrative  . Not on file    Outpatient Encounter Medications as of 01/22/2018  Medication Sig Note  . acetaminophen (TYLENOL) 500 MG tablet Take 1,000 mg by mouth every 6 (six) hours as needed. Pain    . albuterol (PROVENTIL) (2.5 MG/3ML) 0.083% nebulizer solution Take 3 mLs (2.5 mg total) by nebulization every 6 (six) hours as needed for wheezing or shortness of breath.   Marland Kitchen amLODipine (NORVASC) 5 MG tablet TAKE 1 TABLET BY MOUTH EVERY DAY FOR HYPERTENSION   . atorvastatin (LIPITOR) 40 MG tablet TAKE 1 TABLET BY MOUTH EVERY DAY   . cetirizine (ZYRTEC) 10 MG tablet Take 10 mg by mouth at bedtime.  02/07/2016: prn  . DULoxetine (CYMBALTA) 20 MG capsule TAKE 1 CAPSULE(20 MG) BY MOUTH DAILY   . esomeprazole (NEXIUM) 40 MG capsule Take 40 mg by mouth daily at 12 noon.   Marland Kitchen glucose blood (CONTOUR NEXT TEST) test strip Check sugar once daily  DX E11.9   . hydrOXYzine (ATARAX/VISTARIL) 10 MG tablet TAKE 1 TO 2 TABLETS BY MOUTH EVERY 6 HOURS AS NEEDED   . levothyroxine (SYNTHROID, LEVOTHROID) 100 MCG tablet TAKE 1 TABLET(100 MCG) BY MOUTH DAILY BEFORE BREAKFAST   . lisinopril (PRINIVIL,ZESTRIL) 10 MG tablet TAKE 1 TABLET BY MOUTH EVERY NIGHT AT BEDTIME FOR HYPERTENSION   . metFORMIN (GLUCOPHAGE) 1000 MG tablet TAKE 1 TABLET(1000 MG) BY MOUTH DAILY  WITH SUPPER   . omeprazole (PRILOSEC) 20 MG capsule Take 20 mg by mouth daily.   Marland Kitchen tiotropium (SPIRIVA) 18 MCG inhalation capsule Place 18 mcg into inhaler and inhale daily.    No facility-administered encounter medications on file as of 01/22/2018.     Allergies  Allergen Reactions  . Oxycodone Other (See Comments)    Mental status change  . Prednisone     swelling, bruising.    Review of Systems  Constitutional:  Negative for fever and malaise/fatigue.  HENT: Negative.   Eyes: Negative.   Respiratory: Negative for cough, shortness of breath and wheezing.   Cardiovascular: Negative for chest pain, palpitations, orthopnea, claudication and leg swelling.  Gastrointestinal: Negative.   Genitourinary: Negative.   Musculoskeletal: Positive for back pain and joint pain.  Skin: Negative.   Endo/Heme/Allergies: Negative.   Psychiatric/Behavioral: Negative.     Objective:  BP 138/80 (BP Location: Right Arm, Patient Position: Sitting, Cuff Size: Normal)   Pulse (!) 101   Temp 98.1 F (36.7 C) (Oral)   Resp 18   Wt 192 lb (87.1 kg)   SpO2 99%   BMI 31.95 kg/m   Physical Exam  Constitutional: She is oriented to person, place, and time and well-developed, well-nourished, and in no distress.  HENT:  Head: Normocephalic and atraumatic.  Right Ear: External ear normal.  Left Ear: External ear normal.  Nose: Nose normal.  Eyes: Conjunctivae are normal. No scleral icterus.  Neck: No thyromegaly present.  Cardiovascular: Normal rate, regular rhythm and normal heart sounds.  Pulmonary/Chest: Effort normal and breath sounds normal.  Abdominal: Soft.  Musculoskeletal: She exhibits no edema.  Neurological: She is alert and oriented to person, place, and time. GCS score is 15.  Skin: Skin is warm and dry.  Psychiatric: Mood, memory, affect and judgment normal.    Assessment and Plan :    1. Adult hypothyroidism  - TSH  2. Type 2 diabetes mellitus without complication, without  long-term current use of insulin (HCC)  - Hemoglobin A1c  3. Essential hypertension   4. COPD, mild ([place]) On PPI.  5. Osteoarthritis, unspecified osteoarthritis type, unspecified site Refill hydrocodone which she takes infrequently.  HPI, Exam and A&P Transcribed under the direction and in the presence of [name]., MD. Electronically Signed: Althea Charon, [place]

## 2018-01-23 LAB — HEMOGLOBIN A1C
Est. average glucose Bld gHb Est-mCnc: 160 mg/dL
Hgb A1c MFr Bld: 7.2 % — ABNORMAL HIGH (ref 4.8–5.6)

## 2018-01-23 LAB — TSH: TSH: 0.589 u[IU]/mL (ref 0.450–4.500)

## 2018-01-24 ENCOUNTER — Telehealth: Payer: Self-pay

## 2018-01-24 NOTE — Telephone Encounter (Signed)
-----   Message from Jerrol Banana., MD sent at 01/23/2018  2:05 PM EDT ----- Labs stable

## 2018-01-24 NOTE — Telephone Encounter (Signed)
Pt advised.   Thanks,   -Merie Wulf

## 2018-02-07 ENCOUNTER — Other Ambulatory Visit: Payer: Self-pay

## 2018-02-10 ENCOUNTER — Other Ambulatory Visit: Payer: Self-pay | Admitting: Family Medicine

## 2018-02-10 NOTE — Telephone Encounter (Signed)
[facility] faxed refill request for the following medications:  metFORMIN (GLUCOPHAGE) 1000 MG tablet  Qty: 90  Last refill: 02/10/2018  Please advise.

## 2018-02-10 NOTE — Telephone Encounter (Signed)
[facility] faxed refill request for the following medications:  levothyroxine (SYNTHROID, LEVOTHROID) 100 MCG tablet  Qty: 90  Last refill: 02/10/2018  Please advise.

## 2018-02-10 NOTE — Telephone Encounter (Signed)
[facility] faxed refill request for the following medications:  hydrOXYzine (ATARAX/VISTARIL) 10 MG tablet   Qty: 100  Last refill:  02/10/2018  Please advise.

## 2018-02-11 MED ORDER — LEVOTHYROXINE SODIUM 100 MCG PO TABS: ORAL_TABLET | ORAL | 1 refills | Status: DC

## 2018-02-11 NOTE — Telephone Encounter (Signed)
Rx sent to pharmacy   

## 2018-02-12 ENCOUNTER — Other Ambulatory Visit: Payer: Self-pay

## 2018-02-12 DIAGNOSIS — E119 Type 2 diabetes mellitus without complications: Secondary | ICD-10-CM

## 2018-02-12 NOTE — Telephone Encounter (Signed)
Pt is also wanted to know about a PA for Hydrocodone/acetaminophen.    Please advise.  Thanks,   -Mickel Baas

## 2018-02-13 MED ORDER — METFORMIN HCL 1000 MG PO TABS: ORAL_TABLET | ORAL | 3 refills | Status: DC

## 2018-02-13 MED ORDER — HYDROXYZINE HCL 10 MG PO TABS: 10.0000 mg | ORAL_TABLET | Freq: Four times a day (QID) | ORAL | 4 refills | Status: DC | PRN

## 2018-02-26 DIAGNOSIS — M19012 Primary osteoarthritis, left shoulder: Secondary | ICD-10-CM | POA: Diagnosis not present

## 2018-02-26 DIAGNOSIS — M7582 Other shoulder lesions, left shoulder: Secondary | ICD-10-CM | POA: Diagnosis not present

## 2018-06-03 DIAGNOSIS — M4726 Other spondylosis with radiculopathy, lumbar region: Secondary | ICD-10-CM | POA: Diagnosis not present

## 2018-06-03 DIAGNOSIS — M48062 Spinal stenosis, lumbar region with neurogenic claudication: Secondary | ICD-10-CM | POA: Diagnosis not present

## 2018-06-03 DIAGNOSIS — M5136 Other intervertebral disc degeneration, lumbar region: Secondary | ICD-10-CM | POA: Diagnosis not present

## 2018-06-03 DIAGNOSIS — M5416 Radiculopathy, lumbar region: Secondary | ICD-10-CM | POA: Diagnosis not present

## 2018-06-17 DIAGNOSIS — M4726 Other spondylosis with radiculopathy, lumbar region: Secondary | ICD-10-CM | POA: Diagnosis not present

## 2018-06-17 DIAGNOSIS — M47816 Spondylosis without myelopathy or radiculopathy, lumbar region: Secondary | ICD-10-CM | POA: Diagnosis not present

## 2018-07-08 DIAGNOSIS — M5136 Other intervertebral disc degeneration, lumbar region: Secondary | ICD-10-CM | POA: Diagnosis not present

## 2018-07-12 ENCOUNTER — Other Ambulatory Visit: Payer: Self-pay | Admitting: Family Medicine

## 2018-07-21 ENCOUNTER — Other Ambulatory Visit: Payer: Self-pay | Admitting: Family Medicine

## 2018-07-21 NOTE — Telephone Encounter (Signed)
[facility] faxed refill request for the following medications:  lisinopril (PRINIVIL,ZESTRIL) 10 MG tablet  Please advise. Thanks TNP

## 2018-07-22 ENCOUNTER — Encounter: Payer: Medicare Other | Admitting: Family Medicine

## 2018-07-22 MED ORDER — LISINOPRIL 10 MG PO TABS: ORAL_TABLET | ORAL | 11 refills | Status: DC

## 2018-08-12 ENCOUNTER — Other Ambulatory Visit: Payer: Self-pay | Admitting: Family Medicine

## 2018-08-12 DIAGNOSIS — M7582 Other shoulder lesions, left shoulder: Secondary | ICD-10-CM | POA: Diagnosis not present

## 2018-08-12 DIAGNOSIS — M19011 Primary osteoarthritis, right shoulder: Secondary | ICD-10-CM | POA: Diagnosis not present

## 2018-08-12 DIAGNOSIS — M19012 Primary osteoarthritis, left shoulder: Secondary | ICD-10-CM | POA: Diagnosis not present

## 2018-08-20 ENCOUNTER — Other Ambulatory Visit: Payer: Self-pay | Admitting: Family Medicine

## 2018-09-22 DIAGNOSIS — H401131 Primary open-angle glaucoma, bilateral, mild stage: Secondary | ICD-10-CM | POA: Diagnosis not present

## 2018-09-22 DIAGNOSIS — H26492 Other secondary cataract, left eye: Secondary | ICD-10-CM | POA: Diagnosis not present

## 2018-09-22 DIAGNOSIS — Z961 Presence of intraocular lens: Secondary | ICD-10-CM | POA: Diagnosis not present

## 2018-09-22 DIAGNOSIS — H04129 Dry eye syndrome of unspecified lacrimal gland: Secondary | ICD-10-CM | POA: Diagnosis not present

## 2018-09-24 DIAGNOSIS — L720 Epidermal cyst: Secondary | ICD-10-CM | POA: Diagnosis not present

## 2018-09-24 DIAGNOSIS — D485 Neoplasm of uncertain behavior of skin: Secondary | ICD-10-CM | POA: Diagnosis not present

## 2018-09-24 DIAGNOSIS — Z85828 Personal history of other malignant neoplasm of skin: Secondary | ICD-10-CM | POA: Diagnosis not present

## 2018-09-24 DIAGNOSIS — C44622 Squamous cell carcinoma of skin of right upper limb, including shoulder: Secondary | ICD-10-CM | POA: Diagnosis not present

## 2018-09-24 DIAGNOSIS — L570 Actinic keratosis: Secondary | ICD-10-CM | POA: Diagnosis not present

## 2018-09-24 DIAGNOSIS — L820 Inflamed seborrheic keratosis: Secondary | ICD-10-CM | POA: Diagnosis not present

## 2018-09-24 DIAGNOSIS — L578 Other skin changes due to chronic exposure to nonionizing radiation: Secondary | ICD-10-CM | POA: Diagnosis not present

## 2018-10-13 DIAGNOSIS — M4726 Other spondylosis with radiculopathy, lumbar region: Secondary | ICD-10-CM | POA: Diagnosis not present

## 2018-10-13 DIAGNOSIS — M48062 Spinal stenosis, lumbar region with neurogenic claudication: Secondary | ICD-10-CM | POA: Diagnosis not present

## 2018-10-13 DIAGNOSIS — M5136 Other intervertebral disc degeneration, lumbar region: Secondary | ICD-10-CM | POA: Diagnosis not present

## 2018-10-13 DIAGNOSIS — M5416 Radiculopathy, lumbar region: Secondary | ICD-10-CM | POA: Diagnosis not present

## 2018-10-16 DIAGNOSIS — G8929 Other chronic pain: Secondary | ICD-10-CM | POA: Diagnosis not present

## 2018-10-16 DIAGNOSIS — M5442 Lumbago with sciatica, left side: Secondary | ICD-10-CM | POA: Diagnosis not present

## 2018-10-16 DIAGNOSIS — M5441 Lumbago with sciatica, right side: Secondary | ICD-10-CM | POA: Diagnosis not present

## 2018-10-21 DIAGNOSIS — G8929 Other chronic pain: Secondary | ICD-10-CM | POA: Diagnosis not present

## 2018-10-21 DIAGNOSIS — M5441 Lumbago with sciatica, right side: Secondary | ICD-10-CM | POA: Diagnosis not present

## 2018-10-21 DIAGNOSIS — M5442 Lumbago with sciatica, left side: Secondary | ICD-10-CM | POA: Diagnosis not present

## 2018-10-24 ENCOUNTER — Telehealth: Payer: Self-pay | Admitting: Family Medicine

## 2018-10-24 DIAGNOSIS — R430 Anosmia: Secondary | ICD-10-CM

## 2018-10-24 DIAGNOSIS — R05 Cough: Secondary | ICD-10-CM

## 2018-10-24 DIAGNOSIS — R5383 Other fatigue: Secondary | ICD-10-CM

## 2018-10-24 DIAGNOSIS — R059 Cough, unspecified: Secondary | ICD-10-CM

## 2018-10-24 DIAGNOSIS — R0602 Shortness of breath: Secondary | ICD-10-CM

## 2018-10-24 NOTE — Telephone Encounter (Signed)
Pt is wanting to be tested for COVID.  Has the following for about 2 weeks:  Loss of taste Cough Shortness of breath Fatigued  Please advise.  Thanks, [employer]

## 2018-10-24 NOTE — Telephone Encounter (Signed)
Have entered order. Go to testing site.

## 2018-10-24 NOTE — Telephone Encounter (Signed)
Please review for Dr. Gilbert  Thanks,   -Nisaiah Bechtol  

## 2018-10-24 NOTE — Telephone Encounter (Signed)
Pt advised.   Thanks,   -Bucky Grigg  

## 2018-10-27 ENCOUNTER — Other Ambulatory Visit: Payer: Self-pay

## 2018-10-27 DIAGNOSIS — R6889 Other general symptoms and signs: Secondary | ICD-10-CM | POA: Diagnosis not present

## 2018-10-27 DIAGNOSIS — Z20822 Contact with and (suspected) exposure to covid-19: Secondary | ICD-10-CM

## 2018-10-28 LAB — NOVEL CORONAVIRUS, NAA: SARS-CoV-2, NAA: NOT DETECTED

## 2018-10-29 ENCOUNTER — Telehealth: Payer: Self-pay

## 2018-10-29 NOTE — Telephone Encounter (Signed)
Patient notified of test results 

## 2018-10-29 NOTE — Telephone Encounter (Signed)
-----   Message from Jerrol Banana., MD sent at 10/29/2018  8:03 AM EDT ----- No covid.

## 2018-11-04 ENCOUNTER — Other Ambulatory Visit: Payer: Self-pay

## 2018-11-04 ENCOUNTER — Encounter: Payer: Self-pay | Admitting: Family Medicine

## 2018-11-04 ENCOUNTER — Ambulatory Visit (INDEPENDENT_AMBULATORY_CARE_PROVIDER_SITE_OTHER): Payer: Medicare Other | Admitting: Family Medicine

## 2018-11-04 VITALS — BP 140/75 | HR 96 | Temp 98.5°F | Resp 20 | Ht 64.0 in | Wt 200.0 lb

## 2018-11-04 DIAGNOSIS — M5116 Intervertebral disc disorders with radiculopathy, lumbar region: Secondary | ICD-10-CM | POA: Diagnosis not present

## 2018-11-04 DIAGNOSIS — M1990 Unspecified osteoarthritis, unspecified site: Secondary | ICD-10-CM | POA: Diagnosis not present

## 2018-11-04 DIAGNOSIS — E7800 Pure hypercholesterolemia, unspecified: Secondary | ICD-10-CM

## 2018-11-04 DIAGNOSIS — E039 Hypothyroidism, unspecified: Secondary | ICD-10-CM

## 2018-11-04 DIAGNOSIS — J449 Chronic obstructive pulmonary disease, unspecified: Secondary | ICD-10-CM | POA: Diagnosis not present

## 2018-11-04 DIAGNOSIS — I10 Essential (primary) hypertension: Secondary | ICD-10-CM | POA: Diagnosis not present

## 2018-11-04 DIAGNOSIS — Z0000 Encounter for general adult medical examination without abnormal findings: Secondary | ICD-10-CM

## 2018-11-04 DIAGNOSIS — E119 Type 2 diabetes mellitus without complications: Secondary | ICD-10-CM | POA: Diagnosis not present

## 2018-11-04 NOTE — Progress Notes (Signed)
Patient: Kathryn [name], Female    DOB: 1933/06/16, 85 y.o.   MRN: [id number] Visit Date: 11/04/2018  Today's Provider: [name], MD   Chief Complaint  Patient presents with  . Annual Wellness Visit  . Hypertension  . Hypothyroidism  . Hyperlipidemia   Subjective:     Annual wellness visit Kathryn [name] is a 85 y.o. female. She feels well. She reports exercising none. She reports she is sleeping fairly well.    Hypertension, follow-up:  BP Readings from Last 3 Encounters:  11/04/18 140/75  01/22/18 138/80  07/23/17 138/68    She was last seen for hypertension 10 months ago.  BP at that visit was 138/80. Management since that visit includes no changes. She reports good compliance with treatment. She is not having side effects.  She is not exercising. She is adherent to low salt diet.   Outside blood pressures are checked occasionally. She is experiencing swelling in ankles and feet.   Patient denies exertional chest pressure/discomfort and palpitations.   Cardiovascular risk factors include dyslipidemia, obesity (BMI >= 30 kg/m2) and sedentary lifestyle.   Weight trend: stable Wt Readings from Last 3 Encounters:  11/04/18 200 lb (90.7 kg)  01/22/18 192 lb (87.1 kg)  07/23/17 188 lb (85.3 kg)    Current diet: well balanced   Lipid/Cholesterol, Follow-up:   Last seen for this10 months ago.  Management changes since that visit include no changes. . Last Lipid Panel:    Component Value Date/Time   CHOL 183 07/23/2017 1432   TRIG 194 (H) 07/23/2017 1432   HDL 61 07/23/2017 1432   CHOLHDL 3.0 07/23/2017 1432   LDLCALC 83 07/23/2017 1432    Risk factors for vascular disease include hypertension  She reports good compliance with treatment. She is not having side effects.   Hypothyroidism, follow up: Patient was last seen in the office 10 months ago. No medications were changed since last visit. She is currently taking levothyroxine  155mcg daily, and reports good compliance and good symptom control.  Lab Results  Component Value Date   TSH 0.589 01/22/2018    Review of Systems  Constitutional: Negative.   HENT: Negative.   Eyes: Negative.   Respiratory: Negative.   Cardiovascular: Positive for leg swelling.  Gastrointestinal: Negative.   Endocrine: Negative.   Genitourinary: Negative.   Musculoskeletal: Negative.   Skin: Negative.   Allergic/Immunologic: Negative.   Neurological: Negative.   Hematological: Negative.   Psychiatric/Behavioral: Negative.     Social History   Socioeconomic History  . Marital status: Widowed    Spouse name: Not on file  . Number of children: Not on file  . Years of education: Not on file  . Highest education level: Not on file  Occupational History  . Not on file  Social Needs  . Financial resource strain: Not on file  . Food insecurity    Worry: Not on file    Inability: Not on file  . Transportation needs    Medical: Not on file    Non-medical: Not on file  Tobacco Use  . Smoking status: Former Smoker    Packs/day: 0.50    Years: 50.00    Pack years: 25.00    Types: Cigarettes    Quit date: 05/16/2004    Years since quitting: 14.4  . Smokeless tobacco: Never Used  Substance and Sexual Activity  . Alcohol use: Yes    Comment: socially  . Drug use:  No  . Sexual activity: Yes    Birth control/protection: None  Lifestyle  . Physical activity    Days per week: Not on file    Minutes per session: Not on file  . Stress: Not on file  Relationships  . Social Herbalist on phone: Not on file    Gets together: Not on file    Attends religious service: Not on file    Active member of club or organization: Not on file    Attends meetings of clubs or organizations: Not on file    Relationship status: Not on file  . Intimate partner violence    Fear of current or ex partner: Not on file    Emotionally abused: Not on file    Physically abused: Not on  file    Forced sexual activity: Not on file  Other Topics Concern  . Not on file  Social History Narrative  . Not on file    Past Medical History:  Diagnosis Date  . Arthritis    spinal stenosis  . COPD (chronic obstructive pulmonary disease) ([place])   . Gastric ulcer 4/12   treated with Prolisec- states no problems now  . Hyperlipidemia   . Hypertension    PCP Dr Miguel Aschoff   [place]  . Hypothyroidism   . Neuromuscular disorder ([place])    slight numbness right toes- comes and goes  . Peripheral vascular disease (HCC)    varicose veins left leg  . Pneumonia   . Seasonal allergies   . Shortness of breath   . Skin cancer    multiple from face     Patient Active Problem List   Diagnosis Date Noted  . Diabetes mellitus type 2, uncomplicated ([place]) 45/80/9983  . COPD, mild ([place]) 09/17/2014  . Clinical depression 09/17/2014  . Elevated platelet count 09/17/2014  . Elevated erythrocyte sedimentation rate 09/17/2014  . History of tobacco use 09/17/2014  . Hypercholesteremia 09/17/2014  . BP (high blood pressure) 09/17/2014  . Adult hypothyroidism 09/17/2014  . Cannot sleep 09/17/2014  . Lumbar disc disease with radiculopathy 09/17/2014  . Arthritis, degenerative 09/17/2014  . Excess weight 09/17/2014  . Vertebrobasilar circulation transient ischemic attack 09/17/2014  . Lung nodule, multiple 09/17/2014  . Basilar artery insufficiency 09/17/2014  . Neuritis or radiculitis due to rupture of lumbar intervertebral disc 08/25/2013  . Degenerative arthritis of lumbar spine 08/25/2013  . Lumbar canal stenosis 08/25/2013  . Smoker 07/15/2012  . Back pain 07/15/2012  . SOB (shortness of breath) 07/15/2012  . Hyperlipidemia 07/15/2012  . Essential hypertension 07/15/2012    Past Surgical History:  Procedure Laterality Date  . BACK SURGERY     4/12  Dr Shellia Carwin- for lumbar stenosis  . BREAST CYST ASPIRATION Left 1965   negative  . BREAST SURGERY  1962   lumpectomy  with biopsy   left  . CARPAL TUNNEL RELEASE     right  . COLONOSCOPY    . COLONOSCOPY WITH PROPOFOL N/A 06/30/2015   Procedure: COLONOSCOPY WITH PROPOFOL;  Surgeon: Hulen Luster, MD;  Location: [facility] ENDOSCOPY;  Service: Gastroenterology;  Laterality: N/A;  . COLONOSCOPY WITH PROPOFOL N/A 01/22/2017   Procedure: COLONOSCOPY WITH PROPOFOL;  Surgeon: Lollie Sails, MD;  Location: [facility] ENDOSCOPY;  Service: Endoscopy;  Laterality: N/A;  . ESOPHAGOGASTRODUODENOSCOPY    . EYE SURGERY     cataract extraction with IOL bilaterally  . JOINT REPLACEMENT    . LUMBAR LAMINECTOMY/DECOMPRESSION MICRODISCECTOMY  05/24/2011  Procedure: LUMBAR LAMINECTOMY/DECOMPRESSION MICRODISCECTOMY 2 LEVELS;  Surgeon: Magnus Sinning, MD;  Location: [facility] ORS;  Service: Orthopedics;  Laterality: N/A;  L2-L3, L3-L4 (x-ray)  . RIGHT OOPHORECTOMY     due to STAPH INFECTION    Her family history includes Arthritis in her son; Breast cancer in her maternal aunt; Dementia in her mother; Heart disease in her father; Lung cancer in her father.   Current Outpatient Medications:  .  acetaminophen (TYLENOL) 500 MG tablet, Take 1,000 mg by mouth every 6 (six) hours as needed. Pain , Disp: , Rfl:  .  albuterol (PROVENTIL) (2.5 MG/3ML) 0.083% nebulizer solution, Take 3 mLs (2.5 mg total) by nebulization every 6 (six) hours as needed for wheezing or shortness of breath., Disp: 150 mL, Rfl: 12 .  amLODipine (NORVASC) 5 MG tablet, TAKE 1 TABLET BY MOUTH EVERY DAY FOR HYPERTENSION, Disp: 30 tablet, Rfl: 11 .  atorvastatin (LIPITOR) 40 MG tablet, TAKE 1 TABLET BY MOUTH EVERY DAY, Disp: 90 tablet, Rfl: 3 .  cetirizine (ZYRTEC) 10 MG tablet, Take 10 mg by mouth at bedtime. , Disp: , Rfl:  .  DULoxetine (CYMBALTA) 20 MG capsule, TAKE 1 CAPSULE(20 MG) BY MOUTH DAILY, Disp: 30 capsule, Rfl: 3 .  esomeprazole (NEXIUM) 40 MG capsule, Take 40 mg by mouth daily at 12 noon., Disp: , Rfl:  .  glucose blood (CONTOUR NEXT TEST) test strip, Check sugar  once daily  DX E11.9, Disp: 100 each, Rfl: 3 .  HYDROcodone-acetaminophen (NORCO/VICODIN) 5-325 MG tablet, Take 1 tablet by mouth every 6 (six) hours as needed for moderate pain., Disp: 100 tablet, Rfl: 0 .  hydrOXYzine (ATARAX/VISTARIL) 10 MG tablet, TAKE 1 TO 2 TABLETS(10 TO 20 MG) BY MOUTH EVERY 6 HOURS AS NEEDED, Disp: 100 tablet, Rfl: 4 .  levothyroxine (SYNTHROID) 100 MCG tablet, TAKE 1 TABLET(100 MCG) BY MOUTH DAILY BEFORE BREAKFAST, Disp: 90 tablet, Rfl: 1 .  lisinopril (ZESTRIL) 10 MG tablet, TAKE 1 TABLET BY MOUTH EVERY NIGHT AT BEDTIME FOR HYPERTENSION, Disp: 30 tablet, Rfl: 11 .  metFORMIN (GLUCOPHAGE) 1000 MG tablet, TAKE 1 TABLET(1000 MG) BY MOUTH DAILY WITH SUPPER, Disp: 90 tablet, Rfl: 3 .  omeprazole (PRILOSEC) 20 MG capsule, Take 1 capsule (20 mg total) by mouth daily., Disp: 90 capsule, Rfl: 3 .  tiotropium (SPIRIVA) 18 MCG inhalation capsule, Place 18 mcg into inhaler and inhale daily., Disp: , Rfl:   Patient Care Team: Jerrol Banana., MD as PCP - General (Unknown Physician Specialty) Hessie Knows, MD as Consulting Physician (Orthopedic Surgery)    Objective:    Vitals: BP 140/75   Pulse 96   Temp 98.5 F (36.9 C)   Resp 20   Ht 5\' 4"  (1.626 m)   Wt 200 lb (90.7 kg)   SpO2 97%   BMI 34.33 kg/m   Physical Exam Vitals signs reviewed.  Constitutional:      Appearance: Normal appearance. She is well-developed.     Comments: Elderly WF NAD.  HENT:     Head: Normocephalic and atraumatic.     Right Ear: External ear normal.     Left Ear: External ear normal.     Nose: Nose normal.  Eyes:     Conjunctiva/sclera: Conjunctivae normal.     Pupils: Pupils are equal, round, and reactive to light.  Neck:     Musculoskeletal: Normal range of motion and neck supple.  Cardiovascular:     Rate and Rhythm: Normal rate and regular rhythm.  Heart sounds: Normal heart sounds.  Pulmonary:     Effort: Pulmonary effort is normal.     Breath sounds: Normal breath  sounds.  Abdominal:     Palpations: Abdomen is soft.  Musculoskeletal: Normal range of motion.  Skin:    General: Skin is warm and dry.  Neurological:     General: No focal deficit present.     Mental Status: She is alert and oriented to person, place, and time.     Deep Tendon Reflexes: Reflexes are normal and symmetric.  Psychiatric:        Mood and Affect: Mood normal.        Behavior: Behavior normal.        Thought Content: Thought content normal.        Judgment: Judgment normal.     Activities of Daily Living In your present state of health, do you have any difficulty performing the following activities: 11/04/2018  Hearing? Y  Vision? Y  Difficulty concentrating or making decisions? N  Walking or climbing stairs? Y  Dressing or bathing? N  Doing errands, shopping? Y  Some recent data might be hidden    Fall Risk Assessment Fall Risk  11/04/2018 02/07/2018 08/07/2016 11/11/2014  Falls in the past year? 0 0 No No  Comment - Emmi Telephone Survey: data to providers prior to load - -  Injury with Fall? 0 - - -  Risk for fall due to : Impaired balance/gait - - -  Risk for fall due to: Comment walks with cane - - -  Follow up Falls evaluation completed - - -     Depression Screen PHQ 2/9 Scores 11/04/2018 08/07/2016 08/07/2016 11/11/2014  PHQ - 2 Score 0 0 0 0  PHQ- 9 Score - 2 - -    6CIT Screen 11/04/2018  What Year? 0 points  What month? 0 points  What time? 0 points  Count back from 20 0 points  Months in reverse 0 points  Repeat phrase 0 points  Total Score 0      Assessment & Plan:     Annual Wellness Visit  Reviewed patient's Family Medical History Reviewed and updated list of patient's medical providers Assessment of cognitive impairment was done Assessed patient's functional ability Established a written schedule for health screening [place] Completed and Reviewed  Exercise Activities and Dietary recommendations Goals    .  Eat more fruits and vegetables     Recommend to eat 1-2 cups of fruit a day.       Immunization History  Administered Date(s) Administered  . Influenza, High Dose Seasonal PF 12/18/2016  . Influenza,inj,Quad PF,6+ Mos 11/12/2013  . Influenza-Unspecified 11/25/2014  . Pneumococcal Conjugate-13 08/07/2016  . Pneumococcal Polysaccharide-23 03/27/2003  . Tdap 02/10/2013  . Zoster 02/10/2013, 11/26/2014    Health Maintenance  Topic Date Due  . FOOT EXAM  11/15/2017  . HEMOGLOBIN A1C  07/24/2018  . OPHTHALMOLOGY EXAM  10/23/2018  . INFLUENZA VACCINE  10/25/2018  . TETANUS/TDAP  02/11/2023  . DEXA SCAN  Completed  . PNA vac Low Risk Adult  Completed     Discussed health benefits of physical activity, and encouraged her to engage in regular exercise appropriate for her age and condition.  1. Medicare annual wellness visit, subsequent   2. Adult hypothyroidism  - TSH  3. Type 2 diabetes mellitus without complication, without long-term current use of insulin (HCC)  - Hemoglobin A1c  4. Essential hypertension  - Comprehensive  metabolic panel  5. COPD, mild ([place])  - CBC with Differential/Platelet  6. Hypercholesteremia  - Lipid panel 7.DDD  8.OA   [name], MD  [facility]

## 2018-11-05 DIAGNOSIS — J449 Chronic obstructive pulmonary disease, unspecified: Secondary | ICD-10-CM | POA: Diagnosis not present

## 2018-11-05 DIAGNOSIS — E119 Type 2 diabetes mellitus without complications: Secondary | ICD-10-CM | POA: Diagnosis not present

## 2018-11-05 DIAGNOSIS — E039 Hypothyroidism, unspecified: Secondary | ICD-10-CM | POA: Diagnosis not present

## 2018-11-05 DIAGNOSIS — E7800 Pure hypercholesterolemia, unspecified: Secondary | ICD-10-CM | POA: Diagnosis not present

## 2018-11-05 DIAGNOSIS — I10 Essential (primary) hypertension: Secondary | ICD-10-CM | POA: Diagnosis not present

## 2018-11-06 ENCOUNTER — Telehealth: Payer: Self-pay

## 2018-11-06 LAB — HEMOGLOBIN A1C
Est. average glucose Bld gHb Est-mCnc: 171 mg/dL
Hgb A1c MFr Bld: 7.6 % — ABNORMAL HIGH (ref 4.8–5.6)

## 2018-11-06 LAB — COMPREHENSIVE METABOLIC PANEL
ALT: 10 IU/L (ref 0–32)
AST: 14 IU/L (ref 0–40)
Albumin/Globulin Ratio: 1.5 (ref 1.2–2.2)
Albumin: 4 g/dL (ref 3.6–4.6)
Alkaline Phosphatase: 55 IU/L (ref 39–117)
BUN/Creatinine Ratio: 20 (ref 12–28)
BUN: 13 mg/dL (ref 8–27)
Bilirubin Total: 0.4 mg/dL (ref 0.0–1.2)
CO2: 20 mmol/L (ref 20–29)
Calcium: 9.8 mg/dL (ref 8.7–10.3)
Chloride: 102 mmol/L (ref 96–106)
Creatinine, Ser: 0.66 mg/dL (ref 0.57–1.00)
GFR calc Af Amer: 94 mL/min/{1.73_m2} (ref >59)
GFR calc non Af Amer: 82 mL/min/{1.73_m2} (ref >59)
Globulin, Total: 2.6 g/dL (ref 1.5–4.5)
Glucose: 162 mg/dL — ABNORMAL HIGH (ref 65–99)
Potassium: 4.9 mmol/L (ref 3.5–5.2)
Sodium: 139 mmol/L (ref 134–144)
Total Protein: 6.6 g/dL (ref 6.0–8.5)

## 2018-11-06 LAB — CBC WITH DIFFERENTIAL/PLATELET
Basophils Absolute: 0.1 10*3/uL (ref 0.0–0.2)
Basos: 1 %
EOS (ABSOLUTE): 0.3 10*3/uL (ref 0.0–0.4)
Eos: 5 %
Hematocrit: 28.1 % — ABNORMAL LOW (ref 34.0–46.6)
Hemoglobin: 7.8 g/dL — ABNORMAL LOW (ref 11.1–15.9)
Immature Grans (Abs): 0.1 10*3/uL (ref 0.0–0.1)
Immature Granulocytes: 1 %
Lymphocytes Absolute: 2 10*3/uL (ref 0.7–3.1)
Lymphs: 34 %
MCH: 19.2 pg — ABNORMAL LOW (ref 26.6–33.0)
MCHC: 27.8 g/dL — ABNORMAL LOW (ref 31.5–35.7)
MCV: 69 fL — ABNORMAL LOW (ref 79–97)
Monocytes Absolute: 0.6 10*3/uL (ref 0.1–0.9)
Monocytes: 10 %
NRBC: 1 % — ABNORMAL HIGH (ref 0–0)
Neutrophils Absolute: 2.9 10*3/uL (ref 1.4–7.0)
Neutrophils: 49 %
Platelets: 327 10*3/uL (ref 150–450)
RBC: 4.06 x10E6/uL (ref 3.77–5.28)
RDW: 16.9 % — ABNORMAL HIGH (ref 11.7–15.4)
WBC: 5.8 10*3/uL (ref 3.4–10.8)

## 2018-11-06 LAB — LIPID PANEL
Chol/HDL Ratio: 2.8 ratio (ref 0.0–4.4)
Cholesterol, Total: 165 mg/dL (ref 100–199)
HDL: 60 mg/dL (ref >39)
LDL Calculated: 71 mg/dL (ref 0–99)
Triglycerides: 171 mg/dL — ABNORMAL HIGH (ref 0–149)
VLDL Cholesterol Cal: 34 mg/dL (ref 5–40)

## 2018-11-06 LAB — TSH: TSH: 1 u[IU]/mL (ref 0.450–4.500)

## 2018-11-06 NOTE — Telephone Encounter (Signed)
-----   Message from Jerrol Banana., MD sent at 11/06/2018  8:24 AM EDT ----- Significantly anemic--see me next week to recheck labs and discuss how she would like to proceed.

## 2018-11-06 NOTE — Telephone Encounter (Signed)
Apt made for 11/13/2018 at 8:40am.   Thanks,   -Mickel Baas

## 2018-11-10 ENCOUNTER — Other Ambulatory Visit: Payer: Self-pay | Admitting: Family Medicine

## 2018-11-10 DIAGNOSIS — G8929 Other chronic pain: Secondary | ICD-10-CM | POA: Diagnosis not present

## 2018-11-10 DIAGNOSIS — M5442 Lumbago with sciatica, left side: Secondary | ICD-10-CM | POA: Diagnosis not present

## 2018-11-10 DIAGNOSIS — M5441 Lumbago with sciatica, right side: Secondary | ICD-10-CM | POA: Diagnosis not present

## 2018-11-12 DIAGNOSIS — M5441 Lumbago with sciatica, right side: Secondary | ICD-10-CM | POA: Diagnosis not present

## 2018-11-12 DIAGNOSIS — M5442 Lumbago with sciatica, left side: Secondary | ICD-10-CM | POA: Diagnosis not present

## 2018-11-12 DIAGNOSIS — G8929 Other chronic pain: Secondary | ICD-10-CM | POA: Diagnosis not present

## 2018-11-13 ENCOUNTER — Other Ambulatory Visit: Payer: Self-pay

## 2018-11-13 ENCOUNTER — Encounter: Payer: Self-pay | Admitting: Family Medicine

## 2018-11-13 ENCOUNTER — Ambulatory Visit (INDEPENDENT_AMBULATORY_CARE_PROVIDER_SITE_OTHER): Payer: Medicare Other | Admitting: Family Medicine

## 2018-11-13 VITALS — BP 151/67 | HR 106 | Temp 97.6°F | Resp 20 | Wt 200.6 lb

## 2018-11-13 DIAGNOSIS — M5116 Intervertebral disc disorders with radiculopathy, lumbar region: Secondary | ICD-10-CM | POA: Diagnosis not present

## 2018-11-13 DIAGNOSIS — J449 Chronic obstructive pulmonary disease, unspecified: Secondary | ICD-10-CM | POA: Diagnosis not present

## 2018-11-13 DIAGNOSIS — M48061 Spinal stenosis, lumbar region without neurogenic claudication: Secondary | ICD-10-CM | POA: Diagnosis not present

## 2018-11-13 DIAGNOSIS — D649 Anemia, unspecified: Secondary | ICD-10-CM

## 2018-11-13 DIAGNOSIS — E7849 Other hyperlipidemia: Secondary | ICD-10-CM | POA: Diagnosis not present

## 2018-11-13 DIAGNOSIS — I10 Essential (primary) hypertension: Secondary | ICD-10-CM | POA: Diagnosis not present

## 2018-11-13 MED ORDER — FERROUS SULFATE 325 (65 FE) MG PO TABS: 325.0000 mg | ORAL_TABLET | Freq: Every day | ORAL | 3 refills | Status: DC

## 2018-11-13 NOTE — Progress Notes (Signed)
Patient: Kathryn [name] Female    DOB: 07-14-1933   85 y.o.   MRN: [id number] Visit Date: 11/13/2018  Today's Provider: [name], MD   No chief complaint on file.  Subjective:     HPI Follow up for anemia. Patient is very fatigued and experiencing someone shortness of breath when walking.  Allergies  Allergen Reactions  . Oxycodone Other (See Comments)    Mental status change  . Prednisone     swelling, bruising.     Current Outpatient Medications:  .  acetaminophen (TYLENOL) 500 MG tablet, Take 1,000 mg by mouth every 6 (six) hours as needed. Pain , Disp: , Rfl:  .  albuterol (PROVENTIL) (2.5 MG/3ML) 0.083% nebulizer solution, Take 3 mLs (2.5 mg total) by nebulization every 6 (six) hours as needed for wheezing or shortness of breath., Disp: 150 mL, Rfl: 12 .  amLODipine (NORVASC) 5 MG tablet, TAKE 1 TABLET BY MOUTH EVERY DAY FOR HYPERTENSION, Disp: 30 tablet, Rfl: 11 .  atorvastatin (LIPITOR) 40 MG tablet, TAKE 1 TABLET BY MOUTH EVERY DAY, Disp: 90 tablet, Rfl: 3 .  cetirizine (ZYRTEC) 10 MG tablet, Take 10 mg by mouth at bedtime. , Disp: , Rfl:  .  DULoxetine (CYMBALTA) 20 MG capsule, TAKE 1 CAPSULE(20 MG) BY MOUTH DAILY, Disp: 30 capsule, Rfl: 3 .  esomeprazole (NEXIUM) 40 MG capsule, Take 40 mg by mouth daily at 12 noon., Disp: , Rfl:  .  glucose blood (CONTOUR NEXT TEST) test strip, Check sugar once daily  DX E11.9, Disp: 100 each, Rfl: 3 .  HYDROcodone-acetaminophen (NORCO/VICODIN) 5-325 MG tablet, Take 1 tablet by mouth every 6 (six) hours as needed for moderate pain., Disp: 100 tablet, Rfl: 0 .  hydrOXYzine (ATARAX/VISTARIL) 10 MG tablet, TAKE 1 TO 2 TABLETS(10 TO 20 MG) BY MOUTH EVERY 6 HOURS AS NEEDED, Disp: 100 tablet, Rfl: 4 .  levothyroxine (SYNTHROID) 100 MCG tablet, TAKE 1 TABLET(100 MCG) BY MOUTH DAILY BEFORE BREAKFAST, Disp: 90 tablet, Rfl: 1 .  lisinopril (ZESTRIL) 10 MG tablet, TAKE 1 TABLET BY MOUTH EVERY NIGHT AT BEDTIME FOR HYPERTENSION,  Disp: 30 tablet, Rfl: 11 .  metFORMIN (GLUCOPHAGE) 1000 MG tablet, TAKE 1 TABLET(1000 MG) BY MOUTH DAILY WITH SUPPER, Disp: 90 tablet, Rfl: 3 .  omeprazole (PRILOSEC) 20 MG capsule, Take 1 capsule (20 mg total) by mouth daily., Disp: 90 capsule, Rfl: 3 .  tiotropium (SPIRIVA) 18 MCG inhalation capsule, Place 18 mcg into inhaler and inhale daily., Disp: , Rfl:   Review of Systems  Constitutional: Positive for fatigue.  HENT: Negative.   Eyes: Negative.   Respiratory: Positive for shortness of breath.   Gastrointestinal: Negative.   Endocrine: Negative.   Genitourinary: Negative.   Musculoskeletal: Positive for arthralgias and back pain.  Skin: Negative.   Allergic/Immunologic: Negative.   Psychiatric/Behavioral: Negative.     Social History   Tobacco Use  . Smoking status: Former Smoker    Packs/day: 0.50    Years: 50.00    Pack years: 25.00    Types: Cigarettes    Quit date: 05/16/2004    Years since quitting: 14.5  . Smokeless tobacco: Never Used  Substance Use Topics  . Alcohol use: Yes    Comment: socially      Objective:   BP (!) 151/67 (BP Location: Right Arm, Patient Position: Sitting, Cuff Size: Normal)   Pulse (!) 106   Temp 97.6 F (36.4 C) (Temporal)   Resp 20  Wt 200 lb 9.6 oz (91 kg)   SpO2 97%   BMI 34.43 kg/m  Vitals:   11/13/18 0842  BP: (!) 151/67  Pulse: (!) 106  Resp: 20  Temp: 97.6 F (36.4 C)  TempSrc: Temporal  SpO2: 97%  Weight: 200 lb 9.6 oz (91 kg)     Physical Exam Vitals signs reviewed.  Constitutional:      Appearance: She is obese.  HENT:     Head: Normocephalic and atraumatic.     Right Ear: External ear normal.     Left Ear: External ear normal.  Eyes:     General: No scleral icterus.    Comments: Pale conjuctiva  Cardiovascular:     Rate and Rhythm: Normal rate and regular rhythm.     Heart sounds: Normal heart sounds.  Pulmonary:     Breath sounds: Normal breath sounds.  Abdominal:     Palpations: Abdomen is  soft.  Skin:    General: Skin is warm and dry.  Neurological:     General: No focal deficit present.     Mental Status: She is alert and oriented to person, place, and time.  Psychiatric:        Mood and Affect: Mood normal.        Behavior: Behavior normal.        Thought Content: Thought content normal.        Judgment: Judgment normal.      No results found for any visits on 11/13/18.     Assessment & Plan    1. Anemia, unspecified type/Iron deficient GI referral first,may need heme referral Start Iron daily - CBC w/Diff/Platelet - Fe+TIBC+Fer - Ambulatory referral to Gastroenterology  2. Lumbar disc disease with radiculopathy   3. Spinal stenosis of lumbar region without neurogenic claudication   4. COPD, mild ([place])   5. Essential hypertension   6. Other hyperlipidemia  7.Failure to [place], MD  [facility]

## 2018-11-14 LAB — CBC WITH DIFFERENTIAL/PLATELET
Basophils Absolute: 0.1 10*3/uL (ref 0.0–0.2)
Basos: 1 %
EOS (ABSOLUTE): 0.3 10*3/uL (ref 0.0–0.4)
Eos: 5 %
Hematocrit: 27.6 % — ABNORMAL LOW (ref 34.0–46.6)
Hemoglobin: 7.6 g/dL — ABNORMAL LOW (ref 11.1–15.9)
Immature Grans (Abs): 0 10*3/uL (ref 0.0–0.1)
Immature Granulocytes: 1 %
Lymphocytes Absolute: 1.7 10*3/uL (ref 0.7–3.1)
Lymphs: 32 %
MCH: 19.1 pg — ABNORMAL LOW (ref 26.6–33.0)
MCHC: 27.5 g/dL — ABNORMAL LOW (ref 31.5–35.7)
MCV: 69 fL — ABNORMAL LOW (ref 79–97)
Monocytes Absolute: 0.6 10*3/uL (ref 0.1–0.9)
Monocytes: 12 %
NRBC: 1 % — ABNORMAL HIGH (ref 0–0)
Neutrophils Absolute: 2.6 10*3/uL (ref 1.4–7.0)
Neutrophils: 49 %
Platelets: 238 10*3/uL (ref 150–450)
RBC: 3.98 x10E6/uL (ref 3.77–5.28)
RDW: 17.2 % — ABNORMAL HIGH (ref 11.7–15.4)
WBC: 5.3 10*3/uL (ref 3.4–10.8)

## 2018-11-14 LAB — IRON,TIBC AND FERRITIN PANEL
Ferritin: 9 ng/mL — ABNORMAL LOW (ref 15–150)
Iron Saturation: 5 % — CL (ref 15–55)
Iron: 21 ug/dL — ABNORMAL LOW (ref 27–139)
Total Iron Binding Capacity: 456 ug/dL — ABNORMAL HIGH (ref 250–450)
UIBC: 435 ug/dL — ABNORMAL HIGH (ref 118–369)

## 2018-11-17 ENCOUNTER — Telehealth: Payer: Self-pay

## 2018-11-17 NOTE — Telephone Encounter (Signed)
Left message to call back  

## 2018-11-17 NOTE — Telephone Encounter (Signed)
-----   Message from Jerrol Banana., MD sent at 11/14/2018  9:21 AM EDT ----- Stable but low--start iron daily if not done--also refer to GI id not done--CBC/iron next week befor f/u in 2 weeks.

## 2018-11-18 DIAGNOSIS — M5442 Lumbago with sciatica, left side: Secondary | ICD-10-CM | POA: Diagnosis not present

## 2018-11-18 DIAGNOSIS — M5441 Lumbago with sciatica, right side: Secondary | ICD-10-CM | POA: Diagnosis not present

## 2018-11-18 DIAGNOSIS — G8929 Other chronic pain: Secondary | ICD-10-CM | POA: Diagnosis not present

## 2018-11-19 DIAGNOSIS — D509 Iron deficiency anemia, unspecified: Secondary | ICD-10-CM | POA: Diagnosis not present

## 2018-11-20 DIAGNOSIS — M5441 Lumbago with sciatica, right side: Secondary | ICD-10-CM | POA: Diagnosis not present

## 2018-11-20 DIAGNOSIS — G8929 Other chronic pain: Secondary | ICD-10-CM | POA: Diagnosis not present

## 2018-11-20 DIAGNOSIS — M5442 Lumbago with sciatica, left side: Secondary | ICD-10-CM | POA: Diagnosis not present

## 2018-11-25 ENCOUNTER — Telehealth: Payer: Self-pay | Admitting: Family Medicine

## 2018-11-25 DIAGNOSIS — M5441 Lumbago with sciatica, right side: Secondary | ICD-10-CM | POA: Diagnosis not present

## 2018-11-25 DIAGNOSIS — M5442 Lumbago with sciatica, left side: Secondary | ICD-10-CM | POA: Diagnosis not present

## 2018-11-25 DIAGNOSIS — G8929 Other chronic pain: Secondary | ICD-10-CM | POA: Diagnosis not present

## 2018-11-25 NOTE — Telephone Encounter (Signed)
Patient given lab results. She is taking iron daily, and has already seen the GI doctor.

## 2018-11-25 NOTE — Telephone Encounter (Signed)
Pt returned missed call. Please call pt back if needed.  Thanks, [employer]

## 2018-11-25 NOTE — Telephone Encounter (Signed)
LMTCB

## 2018-11-27 ENCOUNTER — Ambulatory Visit (INDEPENDENT_AMBULATORY_CARE_PROVIDER_SITE_OTHER): Payer: Medicare Other | Admitting: Family Medicine

## 2018-11-27 ENCOUNTER — Other Ambulatory Visit: Payer: Self-pay

## 2018-11-27 ENCOUNTER — Encounter: Payer: Self-pay | Admitting: Family Medicine

## 2018-11-27 VITALS — BP 146/72 | HR 100 | Temp 98.7°F | Resp 20 | Ht 64.0 in | Wt 199.0 lb

## 2018-11-27 DIAGNOSIS — D649 Anemia, unspecified: Secondary | ICD-10-CM | POA: Diagnosis not present

## 2018-11-27 DIAGNOSIS — Z23 Encounter for immunization: Secondary | ICD-10-CM

## 2018-11-27 DIAGNOSIS — I10 Essential (primary) hypertension: Secondary | ICD-10-CM

## 2018-11-27 DIAGNOSIS — G8929 Other chronic pain: Secondary | ICD-10-CM

## 2018-11-27 DIAGNOSIS — M545 Low back pain, unspecified: Secondary | ICD-10-CM

## 2018-11-27 DIAGNOSIS — E119 Type 2 diabetes mellitus without complications: Secondary | ICD-10-CM | POA: Diagnosis not present

## 2018-11-27 NOTE — Progress Notes (Signed)
Patient: Kathryn [name] Female    DOB: 07/28/1933   85 y.o.   MRN: [id number] Visit Date: 11/27/2018  Today's Provider: [name], MD   Chief Complaint  Patient presents with  . Follow-up  . Anemia   Subjective:    HPI Patient comes in today for a follow up on anemia. She was last seen in the office 2 weeks ago. She was also referred to GI, and she is scheduled to have a colonoscopy in November. She is upset that the procedure is not until November.  BP Readings from Last 3 Encounters:  11/27/18 (!) 146/72  11/13/18 (!) 151/67  11/04/18 140/75   Wt Readings from Last 3 Encounters:  11/27/18 199 lb (90.3 kg)  11/13/18 200 lb 9.6 oz (91 kg)  11/04/18 200 lb (90.7 kg)    Allergies  Allergen Reactions  . Oxycodone Other (See Comments)    Mental status change  . Prednisone     swelling, bruising.     Current Outpatient Medications:  .  acetaminophen (TYLENOL) 500 MG tablet, Take 1,000 mg by mouth every 6 (six) hours as needed. Pain , Disp: , Rfl:  .  albuterol (PROVENTIL) (2.5 MG/3ML) 0.083% nebulizer solution, Take 3 mLs (2.5 mg total) by nebulization every 6 (six) hours as needed for wheezing or shortness of breath., Disp: 150 mL, Rfl: 12 .  amLODipine (NORVASC) 5 MG tablet, TAKE 1 TABLET BY MOUTH EVERY DAY FOR HYPERTENSION, Disp: 30 tablet, Rfl: 11 .  atorvastatin (LIPITOR) 40 MG tablet, TAKE 1 TABLET BY MOUTH EVERY DAY, Disp: 90 tablet, Rfl: 3 .  cetirizine (ZYRTEC) 10 MG tablet, Take 10 mg by mouth at bedtime. , Disp: , Rfl:  .  DULoxetine (CYMBALTA) 20 MG capsule, TAKE 1 CAPSULE(20 MG) BY MOUTH DAILY, Disp: 30 capsule, Rfl: 3 .  esomeprazole (NEXIUM) 40 MG capsule, Take 40 mg by mouth daily at 12 noon., Disp: , Rfl:  .  ferrous sulfate (FERROUSUL) 325 (65 FE) MG tablet, Take 1 tablet (325 mg total) by mouth daily with breakfast., Disp: 30 tablet, Rfl: 3 .  glucose blood (CONTOUR NEXT TEST) test strip, Check sugar once daily  DX E11.9, Disp: 100 each,  Rfl: 3 .  HYDROcodone-acetaminophen (NORCO/VICODIN) 5-325 MG tablet, Take 1 tablet by mouth every 6 (six) hours as needed for moderate pain., Disp: 100 tablet, Rfl: 0 .  hydrOXYzine (ATARAX/VISTARIL) 10 MG tablet, TAKE 1 TO 2 TABLETS(10 TO 20 MG) BY MOUTH EVERY 6 HOURS AS NEEDED, Disp: 100 tablet, Rfl: 4 .  levothyroxine (SYNTHROID) 100 MCG tablet, TAKE 1 TABLET(100 MCG) BY MOUTH DAILY BEFORE BREAKFAST, Disp: 90 tablet, Rfl: 1 .  lisinopril (ZESTRIL) 10 MG tablet, TAKE 1 TABLET BY MOUTH EVERY NIGHT AT BEDTIME FOR HYPERTENSION, Disp: 30 tablet, Rfl: 11 .  metFORMIN (GLUCOPHAGE) 1000 MG tablet, TAKE 1 TABLET(1000 MG) BY MOUTH DAILY WITH SUPPER, Disp: 90 tablet, Rfl: 3 .  omeprazole (PRILOSEC) 20 MG capsule, Take 1 capsule (20 mg total) by mouth daily., Disp: 90 capsule, Rfl: 3 .  tiotropium (SPIRIVA) 18 MCG inhalation capsule, Place 18 mcg into inhaler and inhale daily., Disp: , Rfl:   Review of Systems  Constitutional: Positive for fatigue. Negative for activity change.  Eyes: Negative.   Respiratory: Positive for shortness of breath. Negative for cough.        Has COPD  Cardiovascular: Negative for chest pain, palpitations and leg swelling.  Gastrointestinal: Negative.   Endocrine: Negative for cold  intolerance, heat intolerance, polydipsia, polyphagia and polyuria.  Musculoskeletal: Positive for arthralgias. Negative for joint swelling, myalgias, neck pain and neck stiffness.  Skin: Negative.   Allergic/Immunologic: Negative.   Neurological: Negative for dizziness, light-headedness and headaches.  Hematological: Negative.   Psychiatric/Behavioral: Negative.     Social History   Tobacco Use  . Smoking status: Former Smoker    Packs/day: 0.50    Years: 50.00    Pack years: 25.00    Types: Cigarettes    Quit date: 05/16/2004    Years since quitting: 14.5  . Smokeless tobacco: Never Used  Substance Use Topics  . Alcohol use: Yes    Comment: socially      Objective:   BP (!)  146/72   Pulse 100   Temp 98.7 F (37.1 C)   Resp 20   Ht 5\' 4"  (1.626 m)   Wt 199 lb (90.3 kg)   SpO2 97%   BMI 34.16 kg/m  Vitals:   11/27/18 1512  BP: (!) 146/72  Pulse: 100  Resp: 20  Temp: 98.7 F (37.1 C)  SpO2: 97%  Weight: 199 lb (90.3 kg)  Height: 5\' 4"  (1.626 m)  Body mass index is 34.16 kg/m.   Physical Exam Vitals signs reviewed.  Constitutional:      Appearance: Normal appearance.  HENT:     Head: Normocephalic and atraumatic.  Eyes:     General: No scleral icterus.    Conjunctiva/sclera: Conjunctivae normal.     Comments: Conjuctivae pale but improved.  Cardiovascular:     Rate and Rhythm: Normal rate and regular rhythm.     Heart sounds: Normal heart sounds.  Pulmonary:     Effort: Pulmonary effort is normal.     Breath sounds: Normal breath sounds.  Abdominal:     Palpations: Abdomen is soft.  Neurological:     General: No focal deficit present.     Mental Status: She is alert and oriented to person, place, and time.  Psychiatric:        Thought Content: Thought content normal.        Judgment: Judgment normal.      No results found for any visits on 11/27/18.     Assessment & Plan    1. Anemia, unspecified type If improving will repeat 2 weeks. If not might require transfusion.RTC 1 month. - Iron, TIBC and Ferritin Panel - CBC with Differential/Platelet  2. Flu vaccine need  - Flu Vaccine QUAD High Dose([facility])  3. Essential hypertension   4. Type 2 diabetes mellitus without complication, without long-term current use of insulin ([place])   5. Chronic right-sided low back pain without sciatica      [name], MD  [facility]

## 2018-11-28 ENCOUNTER — Other Ambulatory Visit: Payer: Self-pay

## 2018-11-28 DIAGNOSIS — D649 Anemia, unspecified: Secondary | ICD-10-CM

## 2018-11-28 DIAGNOSIS — M19011 Primary osteoarthritis, right shoulder: Secondary | ICD-10-CM | POA: Diagnosis not present

## 2018-11-28 DIAGNOSIS — M7582 Other shoulder lesions, left shoulder: Secondary | ICD-10-CM | POA: Diagnosis not present

## 2018-11-28 DIAGNOSIS — M19012 Primary osteoarthritis, left shoulder: Secondary | ICD-10-CM | POA: Diagnosis not present

## 2018-11-28 LAB — CBC WITH DIFFERENTIAL/PLATELET
Basophils Absolute: 0.1 10*3/uL (ref 0.0–0.2)
Basos: 1 %
EOS (ABSOLUTE): 0.3 10*3/uL (ref 0.0–0.4)
Eos: 3 %
Hematocrit: 31 % — ABNORMAL LOW (ref 34.0–46.6)
Hemoglobin: 8.8 g/dL — ABNORMAL LOW (ref 11.1–15.9)
Immature Grans (Abs): 0.1 10*3/uL (ref 0.0–0.1)
Immature Granulocytes: 1 %
Lymphocytes Absolute: 2.2 10*3/uL (ref 0.7–3.1)
Lymphs: 26 %
MCH: 20 pg — ABNORMAL LOW (ref 26.6–33.0)
MCHC: 28.4 g/dL — ABNORMAL LOW (ref 31.5–35.7)
MCV: 71 fL — ABNORMAL LOW (ref 79–97)
Monocytes Absolute: 1 10*3/uL — ABNORMAL HIGH (ref 0.1–0.9)
Monocytes: 11 %
NRBC: 1 % — ABNORMAL HIGH (ref 0–0)
Neutrophils Absolute: 5 10*3/uL (ref 1.4–7.0)
Neutrophils: 58 %
Platelets: 581 10*3/uL — ABNORMAL HIGH (ref 150–450)
RBC: 4.39 x10E6/uL (ref 3.77–5.28)
RDW: 22.2 % — ABNORMAL HIGH (ref 11.7–15.4)
WBC: 8.6 10*3/uL (ref 3.4–10.8)

## 2018-11-28 LAB — IRON,TIBC AND FERRITIN PANEL
Ferritin: 17 ng/mL (ref 15–150)
Iron Saturation: 20 % (ref 15–55)
Iron: 92 ug/dL (ref 27–139)
Total Iron Binding Capacity: 453 ug/dL — ABNORMAL HIGH (ref 250–450)
UIBC: 361 ug/dL (ref 118–369)

## 2018-11-28 NOTE — Progress Notes (Signed)
Future orders placed for CBC and Iron.

## 2018-12-02 ENCOUNTER — Telehealth: Payer: Self-pay

## 2018-12-02 NOTE — Telephone Encounter (Signed)
-----   Message from Jerrol Banana., MD sent at 11/28/2018  8:32 AM EDT ----- Stay on iron daily--CBC improving!

## 2018-12-02 NOTE — Telephone Encounter (Signed)
Patient advised as below.  

## 2018-12-10 ENCOUNTER — Other Ambulatory Visit: Payer: Self-pay | Admitting: Family Medicine

## 2018-12-10 MED ORDER — AMLODIPINE BESYLATE 5 MG PO TABS: ORAL_TABLET | ORAL | 11 refills | Status: DC

## 2018-12-10 NOTE — Telephone Encounter (Signed)
[facility] faxed refill request for the following medications:   amLODipine (NORVASC) 5 MG tablet   Please advise.  

## 2018-12-16 ENCOUNTER — Other Ambulatory Visit: Payer: Self-pay | Admitting: *Deleted

## 2018-12-16 DIAGNOSIS — D649 Anemia, unspecified: Secondary | ICD-10-CM

## 2018-12-17 ENCOUNTER — Telehealth: Payer: Self-pay

## 2018-12-17 LAB — CBC WITH DIFFERENTIAL/PLATELET
Basophils Absolute: 0.1 10*3/uL (ref 0.0–0.2)
Basos: 1 %
EOS (ABSOLUTE): 0.3 10*3/uL (ref 0.0–0.4)
Eos: 4 %
Hematocrit: 35.3 % (ref 34.0–46.6)
Hemoglobin: 10.3 g/dL — ABNORMAL LOW (ref 11.1–15.9)
Immature Grans (Abs): 0.1 10*3/uL (ref 0.0–0.1)
Immature Granulocytes: 1 %
Lymphocytes Absolute: 2.2 10*3/uL (ref 0.7–3.1)
Lymphs: 31 %
MCH: 22 pg — ABNORMAL LOW (ref 26.6–33.0)
MCHC: 29.2 g/dL — ABNORMAL LOW (ref 31.5–35.7)
MCV: 75 fL — ABNORMAL LOW (ref 79–97)
Monocytes Absolute: 0.9 10*3/uL (ref 0.1–0.9)
Monocytes: 12 %
Neutrophils Absolute: 3.7 10*3/uL (ref 1.4–7.0)
Neutrophils: 51 %
Platelets: 224 10*3/uL (ref 150–450)
RBC: 4.68 x10E6/uL (ref 3.77–5.28)
RDW: 24.7 % — ABNORMAL HIGH (ref 11.7–15.4)
WBC: 7.2 10*3/uL (ref 3.4–10.8)

## 2018-12-17 NOTE — Telephone Encounter (Signed)
Patient notified of lab results

## 2018-12-17 NOTE — Telephone Encounter (Signed)
-----   Message from Jerrol Banana., MD sent at 12/17/2018  8:47 AM EDT ----- CBC improving.

## 2018-12-31 DIAGNOSIS — D485 Neoplasm of uncertain behavior of skin: Secondary | ICD-10-CM | POA: Diagnosis not present

## 2018-12-31 DIAGNOSIS — L570 Actinic keratosis: Secondary | ICD-10-CM | POA: Diagnosis not present

## 2018-12-31 DIAGNOSIS — L905 Scar conditions and fibrosis of skin: Secondary | ICD-10-CM | POA: Diagnosis not present

## 2018-12-31 DIAGNOSIS — Z85828 Personal history of other malignant neoplasm of skin: Secondary | ICD-10-CM | POA: Diagnosis not present

## 2018-12-31 DIAGNOSIS — L578 Other skin changes due to chronic exposure to nonionizing radiation: Secondary | ICD-10-CM | POA: Diagnosis not present

## 2018-12-31 DIAGNOSIS — L820 Inflamed seborrheic keratosis: Secondary | ICD-10-CM | POA: Diagnosis not present

## 2019-01-21 DIAGNOSIS — D509 Iron deficiency anemia, unspecified: Secondary | ICD-10-CM | POA: Diagnosis not present

## 2019-01-23 ENCOUNTER — Other Ambulatory Visit: Payer: Self-pay

## 2019-01-23 ENCOUNTER — Other Ambulatory Visit
Admission: RE | Admit: 2019-01-23 | Discharge: 2019-01-23 | Disposition: A | Discharge status: Home / Self Care | Payer: Medicare Other | Source: Ambulatory Visit | Attending: Internal Medicine | Admitting: Internal Medicine

## 2019-01-23 DIAGNOSIS — Z20828 Contact with and (suspected) exposure to other viral communicable diseases: Secondary | ICD-10-CM | POA: Diagnosis not present

## 2019-01-23 DIAGNOSIS — Z01812 Encounter for preprocedural laboratory examination: Secondary | ICD-10-CM | POA: Insufficient documentation

## 2019-01-23 LAB — SARS CORONAVIRUS 2 (TAT 6-24 HRS): SARS Coronavirus 2: NEGATIVE

## 2019-01-28 ENCOUNTER — Ambulatory Visit
Admission: RE | Admit: 2019-01-28 | Discharge: 2019-01-28 | Disposition: A | Discharge status: Home / Self Care | Payer: Medicare Other | Attending: Internal Medicine | Admitting: Internal Medicine

## 2019-01-28 ENCOUNTER — Encounter
Admission: RE | Disposition: A | Discharge status: Home / Self Care | Payer: Self-pay | Source: Home / Self Care | Attending: Internal Medicine

## 2019-01-28 ENCOUNTER — Ambulatory Visit: Payer: Medicare Other | Admitting: Anesthesiology

## 2019-01-28 ENCOUNTER — Encounter: Payer: Self-pay | Admitting: *Deleted

## 2019-01-28 DIAGNOSIS — I739 Peripheral vascular disease, unspecified: Secondary | ICD-10-CM | POA: Insufficient documentation

## 2019-01-28 DIAGNOSIS — K449 Diaphragmatic hernia without obstruction or gangrene: Secondary | ICD-10-CM | POA: Diagnosis not present

## 2019-01-28 DIAGNOSIS — I10 Essential (primary) hypertension: Secondary | ICD-10-CM | POA: Diagnosis not present

## 2019-01-28 DIAGNOSIS — Z7984 Long term (current) use of oral hypoglycemic drugs: Secondary | ICD-10-CM | POA: Insufficient documentation

## 2019-01-28 DIAGNOSIS — F329 Major depressive disorder, single episode, unspecified: Secondary | ICD-10-CM | POA: Diagnosis not present

## 2019-01-28 DIAGNOSIS — E039 Hypothyroidism, unspecified: Secondary | ICD-10-CM | POA: Insufficient documentation

## 2019-01-28 DIAGNOSIS — Z85828 Personal history of other malignant neoplasm of skin: Secondary | ICD-10-CM | POA: Diagnosis not present

## 2019-01-28 DIAGNOSIS — J449 Chronic obstructive pulmonary disease, unspecified: Secondary | ICD-10-CM | POA: Insufficient documentation

## 2019-01-28 DIAGNOSIS — M1990 Unspecified osteoarthritis, unspecified site: Secondary | ICD-10-CM | POA: Insufficient documentation

## 2019-01-28 DIAGNOSIS — K635 Polyp of colon: Secondary | ICD-10-CM | POA: Diagnosis not present

## 2019-01-28 DIAGNOSIS — D509 Iron deficiency anemia, unspecified: Secondary | ICD-10-CM | POA: Diagnosis not present

## 2019-01-28 DIAGNOSIS — K5730 Diverticulosis of large intestine without perforation or abscess without bleeding: Secondary | ICD-10-CM | POA: Insufficient documentation

## 2019-01-28 DIAGNOSIS — K648 Other hemorrhoids: Secondary | ICD-10-CM | POA: Diagnosis not present

## 2019-01-28 DIAGNOSIS — K5790 Diverticulosis of intestine, part unspecified, without perforation or abscess without bleeding: Secondary | ICD-10-CM | POA: Diagnosis not present

## 2019-01-28 DIAGNOSIS — D120 Benign neoplasm of cecum: Secondary | ICD-10-CM | POA: Diagnosis not present

## 2019-01-28 DIAGNOSIS — E119 Type 2 diabetes mellitus without complications: Secondary | ICD-10-CM | POA: Diagnosis not present

## 2019-01-28 DIAGNOSIS — Z8711 Personal history of peptic ulcer disease: Secondary | ICD-10-CM | POA: Diagnosis not present

## 2019-01-28 DIAGNOSIS — K640 First degree hemorrhoids: Secondary | ICD-10-CM | POA: Insufficient documentation

## 2019-01-28 DIAGNOSIS — Z79899 Other long term (current) drug therapy: Secondary | ICD-10-CM | POA: Diagnosis not present

## 2019-01-28 DIAGNOSIS — E785 Hyperlipidemia, unspecified: Secondary | ICD-10-CM | POA: Insufficient documentation

## 2019-01-28 DIAGNOSIS — K219 Gastro-esophageal reflux disease without esophagitis: Secondary | ICD-10-CM | POA: Insufficient documentation

## 2019-01-28 HISTORY — PX: COLONOSCOPY WITH PROPOFOL: SHX5780

## 2019-01-28 HISTORY — PX: ESOPHAGOGASTRODUODENOSCOPY (EGD) WITH PROPOFOL: SHX5813

## 2019-01-28 HISTORY — DX: Type 2 diabetes mellitus without complications: E11.9

## 2019-01-28 LAB — GLUCOSE, CAPILLARY: Glucose-Capillary: 140 mg/dL — ABNORMAL HIGH (ref 70–99)

## 2019-01-28 SURGERY — COLONOSCOPY WITH PROPOFOL
Anesthesia: General

## 2019-01-28 MED ORDER — FENTANYL CITRATE (PF) 100 MCG/2ML IJ SOLN
INTRAMUSCULAR | Status: AC
  Filled 2019-01-28: qty 2

## 2019-01-28 MED ORDER — MIDAZOLAM HCL 2 MG/2ML IJ SOLN
INTRAMUSCULAR | Status: DC | PRN
  Administered 2019-01-28 (×2): 1 mg via INTRAVENOUS

## 2019-01-28 MED ORDER — MIDAZOLAM HCL 2 MG/2ML IJ SOLN
INTRAMUSCULAR | Status: AC
  Filled 2019-01-28: qty 2

## 2019-01-28 MED ORDER — FENTANYL CITRATE (PF) 100 MCG/2ML IJ SOLN
INTRAMUSCULAR | Status: DC | PRN
  Administered 2019-01-28 (×4): 25 ug via INTRAVENOUS

## 2019-01-28 MED ORDER — SODIUM CHLORIDE 0.9 % IV SOLN
INTRAVENOUS | Status: DC
  Administered 2019-01-28: 10:00:00 via INTRAVENOUS

## 2019-01-28 MED ORDER — PROPOFOL 500 MG/50ML IV EMUL
INTRAVENOUS | Status: DC | PRN
  Administered 2019-01-28: 65 ug/kg/min via INTRAVENOUS

## 2019-01-28 MED ORDER — PROPOFOL 10 MG/ML IV BOLUS
INTRAVENOUS | Status: AC
  Filled 2019-01-28: qty 20

## 2019-01-28 MED ORDER — PROPOFOL 10 MG/ML IV BOLUS
INTRAVENOUS | Status: DC | PRN
  Administered 2019-01-28 (×3): 20 mg via INTRAVENOUS

## 2019-01-28 NOTE — Anesthesia Post-op Follow-up Note (Signed)
Anesthesia QCDR form completed.        

## 2019-01-28 NOTE — Op Note (Signed)
[facility] Gastroenterology Patient Name: Kathryn [name] Procedure Date: 01/28/2019 10:09 AM MRN: [id number] Account #: [id number] Date of Birth: 04-22-1933 Admit Type: Outpatient Age: 85 Room: [facility] ENDO ROOM 3 Gender: Female Note Status: Finalized Procedure:             Upper GI endoscopy Indications:           Suspected upper gastrointestinal bleeding in patient                         with unexplained iron deficiency anemia Providers:             Benay Pike. Aidan Caloca MD, MD Medicines:             Propofol per Anesthesia Complications:         No immediate complications. Procedure:             Pre-Anesthesia Assessment:                        - The risks and benefits of the procedure and the                         sedation options and risks were discussed with the                         patient. All questions were answered and informed                         consent was obtained.                        - Patient identification and proposed procedure were                         verified prior to the procedure by the nurse. The                         procedure was verified in the procedure room.                        - ASA Grade Assessment: III - A patient with severe                         systemic disease.                        - After reviewing the risks and benefits, the patient                         was deemed in satisfactory condition to undergo the                         procedure.                        After obtaining informed consent, the endoscope was                         passed under direct vision. Throughout the procedure,  the patient's blood pressure, pulse, and oxygen                         saturations were monitored continuously. The Endoscope                         was introduced through the mouth, and advanced to the                         third part of duodenum. The upper GI endoscopy was        accomplished without difficulty. The patient tolerated                         the procedure well. Findings:      The esophagus was normal.      A 1 cm hiatal hernia was present.      The examined duodenum was normal.      The exam was otherwise without abnormality. Impression:            - Normal esophagus.                        - 1 cm hiatal hernia.                        - Normal examined duodenum.                        - The examination was otherwise normal.                        - No specimens collected. Recommendation:        - Proceed with colonoscopy Procedure Code(s):     --- Professional ---                        [phone], Esophagogastroduodenoscopy, flexible,                         transoral; diagnostic, including collection of                         specimen(s) by brushing or washing, when performed                         (separate procedure) Diagnosis Code(s):     --- Professional ---                        K44.9, Diaphragmatic hernia without obstruction or                         gangrene                        D50.9, Iron deficiency anemia, unspecified CPT copyright 2019 American Medical Association. All rights reserved. The codes documented in this report are preliminary and upon coder review may  be revised to meet current compliance requirements. Efrain Sella MD, MD 01/28/2019 10:30:50 AM This report has been signed electronically. Number of Addenda: 0 Note Initiated On: 01/28/2019 10:09 AM Estimated Blood Loss:  Estimated blood loss: none.  [facility]

## 2019-01-28 NOTE — Transfer of Care (Signed)
Immediate Anesthesia Transfer of Care Note  Patient: Kathryn [name]  Procedure(s) Performed: COLONOSCOPY WITH PROPOFOL (N/A ) ESOPHAGOGASTRODUODENOSCOPY (EGD) WITH PROPOFOL (N/A )  Patient Location: PACU  Anesthesia Type:General  Level of Consciousness: awake, alert  and oriented  Airway & Oxygen Therapy: Patient Spontanous Breathing and Patient connected to nasal cannula oxygen  Post-op Assessment: Report given to RN, Post -op Vital signs reviewed and stable and Patient moving all extremities  Post vital signs: Reviewed and stable  Last Vitals:  Vitals Value Taken Time  BP 108/61 01/28/19 1055  Temp    Pulse 90 01/28/19 1055  Resp 17 01/28/19 1055  SpO2 98 % 01/28/19 1055  Vitals shown include unvalidated device data.  Last Pain:  Vitals:   01/28/19 0954  TempSrc: Temporal         Complications: No apparent anesthesia complications

## 2019-01-28 NOTE — Op Note (Signed)
[facility] Gastroenterology Patient Name: Kathryn [name] Procedure Date: 01/28/2019 10:08 AM MRN: [id number] Account #: [id number] Date of Birth: 1933/07/08 Admit Type: Outpatient Age: 85 Room: [facility] ENDO ROOM 3 Gender: Female Note Status: Finalized Procedure:             Colonoscopy Indications:           Iron deficiency anemia Providers:             Benay Pike. Edyn Popoca MD, MD Medicines:             Propofol per Anesthesia Complications:         No immediate complications. Procedure:             Pre-Anesthesia Assessment:                        - The risks and benefits of the procedure and the                         sedation options and risks were discussed with the                         patient. All questions were answered and informed                         consent was obtained.                        - Patient identification and proposed procedure were                         verified prior to the procedure by the nurse. The                         procedure was verified in the procedure room.                        - ASA Grade Assessment: III - A patient with severe                         systemic disease.                        - After reviewing the risks and benefits, the patient                         was deemed in satisfactory condition to undergo the                         procedure.                        After obtaining informed consent, the colonoscope was                         passed under direct vision. Throughout the procedure,                         the patient's blood pressure, pulse, and oxygen  saturations were monitored continuously. The                         Colonoscope was introduced through the anus and                         advanced to the the cecum, identified by appendiceal                         orifice and ileocecal valve. The colonoscopy was                         performed without difficulty. The  patient tolerated                         the procedure well. The quality of the bowel                         preparation was good. The ileocecal valve, appendiceal                         orifice, and rectum were photographed. Findings:      The perianal and digital rectal examinations were normal. Pertinent       negatives include normal sphincter tone and no palpable rectal lesions.      A 11 mm polyp was found in the cecum. The polyp was flat. The polyp was       removed with a cold snare. Resection and retrieval were complete. To       prevent bleeding after the polypectomy, two hemostatic clips were       successfully placed (MR conditional). There was no bleeding at the end       of the procedure.      A few small-mouthed diverticula were found in the sigmoid colon.      Non-bleeding internal hemorrhoids were found during retroflexion. The       hemorrhoids were Grade I (internal hemorrhoids that do not prolapse).      The exam was otherwise without abnormality. Impression:            - One 11 mm polyp in the cecum, removed with a cold                         snare. Resected and retrieved. Clips (MR conditional)                         were placed.                        - Diverticulosis in the sigmoid colon.                        - Non-bleeding internal hemorrhoids.                        - The examination was otherwise normal. Recommendation:        - Patient has a contact number available for                         emergencies. The signs and symptoms of potential  delayed complications were discussed with the patient.                         Return to normal activities tomorrow. Written                         discharge instructions were provided to the patient.                        - Resume previous diet.                        - Continue present medications.                        - Await pathology results.                        - If polyps are  benign or adenomatous without                         dysplasia, I will advise NO further colonoscopy due to                         advanced age and/or severe comorbidity.                        - To visualize the small bowel, perform video capsule                         endoscopy at appointment to be scheduled.                        - Return to physician assistant in 6 weeks.                        - Please follow up with Tammi Klippel, PA-C for                         your visit to [facility]. Of                         course, I remain available to you if you need any help. Procedure Code(s):     --- Professional ---                        [phone], Colonoscopy, flexible; with removal of                         tumor(s), polyp(s), or other lesion(s) by snare                         technique Diagnosis Code(s):     --- Professional ---                        K57.30, Diverticulosis of large intestine without                         perforation or abscess without bleeding  D50.9, Iron deficiency anemia, unspecified                        K64.0, First degree hemorrhoids                        K63.5, Polyp of colon CPT copyright 2019 American Medical Association. All rights reserved. The codes documented in this report are preliminary and upon coder review may  be revised to meet current compliance requirements. Efrain Sella MD, MD 01/28/2019 10:55:27 AM This report has been signed electronically. Number of Addenda: 0 Note Initiated On: 01/28/2019 10:08 AM Scope Withdrawal Time: 0 hours 8 minutes 21 seconds  Total Procedure Duration: 0 hours 15 minutes 34 seconds  Estimated Blood Loss:  Estimated blood loss: none. Estimated blood loss: none.      [facility]

## 2019-01-28 NOTE — Anesthesia Postprocedure Evaluation (Signed)
Anesthesia Post Note  Patient: Kathryn [name]  Procedure(s) Performed: COLONOSCOPY WITH PROPOFOL (N/A ) ESOPHAGOGASTRODUODENOSCOPY (EGD) WITH PROPOFOL (N/A )  Patient location during evaluation: PACU Anesthesia Type: General Level of consciousness: awake and alert and oriented Pain management: pain level controlled Vital Signs Assessment: post-procedure vital signs reviewed and stable Respiratory status: spontaneous breathing Cardiovascular status: blood pressure returned to baseline Anesthetic complications: no     Last Vitals:  Vitals:   01/28/19 1056 01/28/19 1105  BP: 108/61 122/79  Pulse: 90   Resp: 17   Temp:    SpO2: 98%     Last Pain:  Vitals:   01/28/19 1125  TempSrc:   PainSc: (P) 0-No pain                 Cricket Goodlin

## 2019-01-28 NOTE — Anesthesia Preprocedure Evaluation (Signed)
Anesthesia Evaluation  Patient identified by MRN, date of birth, ID band Patient awake    Reviewed: Allergy & Precautions, NPO status , Patient's Chart, lab work & pertinent test results  History of Anesthesia Complications Negative for: history of anesthetic complications  Airway Mallampati: III       Dental   Pulmonary shortness of breath and with exertion, COPD,  COPD inhaler, former smoker,           Cardiovascular hypertension, Pt. on medications + Peripheral Vascular Disease  (-) Past MI and (-) CHF (-) dysrhythmias (-) Valvular Problems/Murmurs     Neuro/Psych neg Seizures PSYCHIATRIC DISORDERS Depression  Neuromuscular disease    GI/Hepatic Neg liver ROS, PUD, GERD  Medicated,  Endo/Other  diabetes, Type 2, Oral Hypoglycemic AgentsHypothyroidism   Renal/GU negative Renal ROS  negative genitourinary   Musculoskeletal  (+) Arthritis , Osteoarthritis,    Abdominal   Peds negative pediatric ROS (+)  Hematology  (+) anemia ,   Anesthesia Other Findings   Reproductive/Obstetrics                             Anesthesia Physical  Anesthesia Plan  ASA: III  Anesthesia Plan: General   Post-op Pain Management:    Induction: Intravenous  PONV Risk Score and Plan: Propofol infusion  Airway Management Planned: Nasal Cannula  Additional Equipment:   Intra-op Plan:   Post-operative Plan:   Informed Consent: I have reviewed the patients History and Physical, chart, labs and discussed the procedure including the risks, benefits and alternatives for the proposed anesthesia with the patient or authorized representative who has indicated his/her understanding and acceptance.       Plan Discussed with:   Anesthesia Plan Comments:         Anesthesia Quick Evaluation

## 2019-01-28 NOTE — Interval H&P Note (Signed)
History and Physical Interval Note:  01/28/2019 10:14 AM  Kathryn [name]  has presented today for surgery, with the diagnosis of Iron Def. Anemia.  The various methods of treatment have been discussed with the patient and family. After consideration of risks, benefits and other options for treatment, the patient has consented to  Procedure(s): COLONOSCOPY WITH PROPOFOL (N/A) ESOPHAGOGASTRODUODENOSCOPY (EGD) WITH PROPOFOL (N/A) as a surgical intervention.  The patient's history has been reviewed, patient examined, no change in status, stable for surgery.  I have reviewed the patient's chart and labs.  Questions were answered to the patient's satisfaction.     [place], [place]

## 2019-01-28 NOTE — H&P (Signed)
Outpatient short stay form Pre-procedure 01/28/2019 9:29 AM Romano Stigger K. Alice Reichert, M.D.  Primary Physician: Miguel Aschoff, M.D.  Reason for visit:  Iron deficiency anemia  History of present illness: Patient with significant drop in Hgb from 12 to 7.5. Denies hematochezia, melena or hemetemesis. Bowel habits are normal. Previous colonoscopies have been largely unremarkable. Patient has recent onset of GERD symptoms without alarm symptoms.    No current facility-administered medications for this encounter.   Current Outpatient Medications:  .  acetaminophen (TYLENOL) 500 MG tablet, Take 1,000 mg by mouth every 6 (six) hours as needed. Pain , Disp: , Rfl:  .  albuterol (PROVENTIL) (2.5 MG/3ML) 0.083% nebulizer solution, Take 3 mLs (2.5 mg total) by nebulization every 6 (six) hours as needed for wheezing or shortness of breath., Disp: 150 mL, Rfl: 12 .  amLODipine (NORVASC) 5 MG tablet, TAKE 1 TABLET BY MOUTH EVERY DAY FOR HYPERTENSION, Disp: 30 tablet, Rfl: 11 .  atorvastatin (LIPITOR) 40 MG tablet, TAKE 1 TABLET BY MOUTH EVERY DAY, Disp: 90 tablet, Rfl: 3 .  cetirizine (ZYRTEC) 10 MG tablet, Take 10 mg by mouth at bedtime. , Disp: , Rfl:  .  DULoxetine (CYMBALTA) 20 MG capsule, TAKE 1 CAPSULE(20 MG) BY MOUTH DAILY, Disp: 30 capsule, Rfl: 3 .  esomeprazole (NEXIUM) 40 MG capsule, Take 40 mg by mouth daily at 12 noon., Disp: , Rfl:  .  ferrous sulfate (FERROUSUL) 325 (65 FE) MG tablet, Take 1 tablet (325 mg total) by mouth daily with breakfast., Disp: 30 tablet, Rfl: 3 .  glucose blood (CONTOUR NEXT TEST) test strip, Check sugar once daily  DX E11.9, Disp: 100 each, Rfl: 3 .  HYDROcodone-acetaminophen (NORCO/VICODIN) 5-325 MG tablet, Take 1 tablet by mouth every 6 (six) hours as needed for moderate pain., Disp: 100 tablet, Rfl: 0 .  hydrOXYzine (ATARAX/VISTARIL) 10 MG tablet, TAKE 1 TO 2 TABLETS(10 TO 20 MG) BY MOUTH EVERY 6 HOURS AS NEEDED, Disp: 100 tablet, Rfl: 4 .  levothyroxine (SYNTHROID)  100 MCG tablet, TAKE 1 TABLET(100 MCG) BY MOUTH DAILY BEFORE BREAKFAST, Disp: 90 tablet, Rfl: 1 .  lisinopril (ZESTRIL) 10 MG tablet, TAKE 1 TABLET BY MOUTH EVERY NIGHT AT BEDTIME FOR HYPERTENSION, Disp: 30 tablet, Rfl: 11 .  metFORMIN (GLUCOPHAGE) 1000 MG tablet, TAKE 1 TABLET(1000 MG) BY MOUTH DAILY WITH SUPPER, Disp: 90 tablet, Rfl: 3 .  omeprazole (PRILOSEC) 20 MG capsule, Take 1 capsule (20 mg total) by mouth daily., Disp: 90 capsule, Rfl: 3 .  tiotropium (SPIRIVA) 18 MCG inhalation capsule, Place 18 mcg into inhaler and inhale daily., Disp: , Rfl:   No medications prior to admission.     Allergies  Allergen Reactions  . Oxycodone Other (See Comments)    Mental status change  . Prednisone     swelling, bruising.     Past Medical History:  Diagnosis Date  . Arthritis    spinal stenosis  . COPD (chronic obstructive pulmonary disease) ([place])   . Gastric ulcer 4/12   treated with Prolisec- states no problems now  . Hyperlipidemia   . Hypertension    PCP Dr Miguel Aschoff   [place]  . Hypothyroidism   . Neuromuscular disorder ([place])    slight numbness right toes- comes and goes  . Peripheral vascular disease ([facility])    varicose veins left leg  . Pneumonia   . Seasonal allergies   . Shortness of breath   . Skin cancer    multiple from face    Review of  systems:  Otherwise negative.    Physical Exam  Gen: Alert, oriented. Appears stated age.  HEENT: [place]/AT. PERRLA. Lungs: CTA, no wheezes. CV: RR nl S1, S2. Abd: soft, benign, no masses. BS+ Ext: No edema. Pulses 2+    Planned procedures: Proceed with EGD and colonoscopy. The patient understands the nature of the planned procedure, indications, risks, alternatives and potential complications including but not limited to bleeding, infection, perforation, damage to internal organs and possible oversedation/side effects from anesthesia. The patient agrees and gives consent to proceed.  Please refer to procedure notes  for findings, recommendations and patient disposition/instructions.     Elesha Thedford K. Alice Reichert, M.D. Gastroenterology 01/28/2019  9:29 AM

## 2019-01-29 ENCOUNTER — Encounter: Payer: Self-pay | Admitting: Internal Medicine

## 2019-01-29 LAB — SURGICAL PATHOLOGY

## 2019-02-04 NOTE — Progress Notes (Signed)
Patient: Kathryn [name] Female    DOB: 1933-05-05   85 y.o.   MRN: [id number] Visit Date: 02/05/2019  Today's Provider: [name], MD   Chief Complaint  Patient presents with  . Follow-up  . Anemia   Subjective:     HPI   Anemia, unspecified type From 11/27/2018-If improving will repeat 2 weeks. If not might require transfusion. Labs checked, advised to stay on iron daily--CBC improving! If it does not improve will refer to hematology.  Allergies  Allergen Reactions  . Oxycodone Other (See Comments)    Mental status change  . Prednisone     swelling, bruising.     Current Outpatient Medications:  .  acetaminophen (TYLENOL) 500 MG tablet, Take 1,000 mg by mouth every 6 (six) hours as needed. Pain , Disp: , Rfl:  .  amLODipine (NORVASC) 5 MG tablet, TAKE 1 TABLET BY MOUTH EVERY DAY FOR HYPERTENSION, Disp: 30 tablet, Rfl: 11 .  atorvastatin (LIPITOR) 40 MG tablet, TAKE 1 TABLET BY MOUTH EVERY DAY, Disp: 90 tablet, Rfl: 3 .  cetirizine (ZYRTEC) 10 MG tablet, Take 10 mg by mouth at bedtime. , Disp: , Rfl:  .  esomeprazole (NEXIUM) 40 MG capsule, Take 40 mg by mouth daily at 12 noon., Disp: , Rfl:  .  ferrous sulfate (FERROUSUL) 325 (65 FE) MG tablet, Take 1 tablet (325 mg total) by mouth daily with breakfast., Disp: 30 tablet, Rfl: 3 .  glucose blood (CONTOUR NEXT TEST) test strip, Check sugar once daily  DX E11.9, Disp: 100 each, Rfl: 3 .  hydrOXYzine (ATARAX/VISTARIL) 10 MG tablet, TAKE 1 TO 2 TABLETS(10 TO 20 MG) BY MOUTH EVERY 6 HOURS AS NEEDED, Disp: 100 tablet, Rfl: 4 .  levothyroxine (SYNTHROID) 100 MCG tablet, TAKE 1 TABLET(100 MCG) BY MOUTH DAILY BEFORE BREAKFAST, Disp: 90 tablet, Rfl: 1 .  lisinopril (ZESTRIL) 10 MG tablet, TAKE 1 TABLET BY MOUTH EVERY NIGHT AT BEDTIME FOR HYPERTENSION, Disp: 30 tablet, Rfl: 11 .  metFORMIN (GLUCOPHAGE) 1000 MG tablet, TAKE 1 TABLET(1000 MG) BY MOUTH DAILY WITH SUPPER, Disp: 90 tablet, Rfl: 3 .  omeprazole (PRILOSEC) 20  MG capsule, Take 1 capsule (20 mg total) by mouth daily., Disp: 90 capsule, Rfl: 3 .  tiotropium (SPIRIVA) 18 MCG inhalation capsule, Place 18 mcg into inhaler and inhale daily., Disp: , Rfl:  .  albuterol (PROVENTIL) (2.5 MG/3ML) 0.083% nebulizer solution, Take 3 mLs (2.5 mg total) by nebulization every 6 (six) hours as needed for wheezing or shortness of breath. (Patient not taking: Reported on 01/28/2019), Disp: 150 mL, Rfl: 12 .  DULoxetine (CYMBALTA) 20 MG capsule, TAKE 1 CAPSULE(20 MG) BY MOUTH DAILY (Patient not taking: Reported on 01/28/2019), Disp: 30 capsule, Rfl: 3 .  HYDROcodone-acetaminophen (NORCO/VICODIN) 5-325 MG tablet, Take 1 tablet by mouth every 6 (six) hours as needed for moderate pain. (Patient not taking: Reported on 01/28/2019), Disp: 100 tablet, Rfl: 0  Review of Systems  Constitutional: Negative.  Negative for activity change.  HENT: Negative.   Eyes: Negative.   Respiratory: Negative for cough.        Has COPD  Cardiovascular: Negative for chest pain, palpitations and leg swelling.  Gastrointestinal: Negative.   Endocrine: Negative for cold intolerance, heat intolerance, polydipsia, polyphagia and polyuria.  Musculoskeletal: Positive for arthralgias. Negative for joint swelling, myalgias, neck pain and neck stiffness.  Skin: Negative.   Allergic/Immunologic: Negative.   Neurological: Negative for dizziness, light-headedness and headaches.  Hematological: Negative.  Psychiatric/Behavioral: Negative.     Social History   Tobacco Use  . Smoking status: Former Smoker    Packs/day: 0.50    Years: 50.00    Pack years: 25.00    Types: Cigarettes    Quit date: 05/16/2004    Years since quitting: 14.7  . Smokeless tobacco: Never Used  Substance Use Topics  . Alcohol use: Yes    Comment: socially      Objective:   BP (!) 145/76 (BP Location: Left Arm, Patient Position: Sitting, Cuff Size: Large)   Pulse 91   Temp (!) 97.3 F (36.3 C) (Other (Comment))   Resp  18   Ht 5\' 4"  (1.626 m)   Wt 196 lb (88.9 kg)   SpO2 94%   BMI 33.64 kg/m  Vitals:   02/05/19 1334  BP: (!) 145/76  Pulse: 91  Resp: 18  Temp: (!) 97.3 F (36.3 C)  TempSrc: Other (Comment)  SpO2: 94%  Weight: 196 lb (88.9 kg)  Height: 5\' 4"  (1.626 m)  Body mass index is 33.64 kg/m.   Physical Exam Vitals signs reviewed.  Constitutional:      Appearance: She is obese.  HENT:     Head: Normocephalic and atraumatic.     Right Ear: External ear normal.     Left Ear: External ear normal.  Eyes:     General: No scleral icterus.    Conjunctiva/sclera: Conjunctivae normal.  Cardiovascular:     Rate and Rhythm: Normal rate and regular rhythm.     Heart sounds: Normal heart sounds.  Pulmonary:     Breath sounds: Normal breath sounds.  Abdominal:     Palpations: Abdomen is soft.  Skin:    General: Skin is warm and dry.  Neurological:     General: No focal deficit present.     Mental Status: She is alert and oriented to person, place, and time.  Psychiatric:        Mood and Affect: Mood normal.        Behavior: Behavior normal.        Thought Content: Thought content normal.        Judgment: Judgment normal.      No results found for any visits on 02/05/19.     Assessment & Plan     1. Anemia, unspecified type May need hematology referral if not coming up with po iron. GI w/u essentially negative. - CBC w/Diff/Platelet - Fe+TIBC+Fer  2. Essential hypertension   3. COPD, mild ([place])   4. Type 2 diabetes mellitus without complication, without long-term current use of insulin ([place])   5. Osteoarthritis, unspecified osteoarthritis type, unspecified site  6.Diverticulosis  Follow up in January.     Kathryn [name] Mon, MD  [facility]

## 2019-02-05 ENCOUNTER — Ambulatory Visit (INDEPENDENT_AMBULATORY_CARE_PROVIDER_SITE_OTHER): Payer: Medicare Other | Admitting: Family Medicine

## 2019-02-05 ENCOUNTER — Encounter: Payer: Self-pay | Admitting: Family Medicine

## 2019-02-05 ENCOUNTER — Other Ambulatory Visit: Payer: Self-pay

## 2019-02-05 VITALS — BP 145/76 | HR 91 | Temp 97.3°F | Resp 18 | Ht 64.0 in | Wt 196.0 lb

## 2019-02-05 DIAGNOSIS — M1990 Unspecified osteoarthritis, unspecified site: Secondary | ICD-10-CM

## 2019-02-05 DIAGNOSIS — J449 Chronic obstructive pulmonary disease, unspecified: Secondary | ICD-10-CM | POA: Diagnosis not present

## 2019-02-05 DIAGNOSIS — D649 Anemia, unspecified: Secondary | ICD-10-CM

## 2019-02-05 DIAGNOSIS — E119 Type 2 diabetes mellitus without complications: Secondary | ICD-10-CM | POA: Diagnosis not present

## 2019-02-05 DIAGNOSIS — I10 Essential (primary) hypertension: Secondary | ICD-10-CM

## 2019-02-05 DIAGNOSIS — K5731 Diverticulosis of large intestine without perforation or abscess with bleeding: Secondary | ICD-10-CM

## 2019-02-06 ENCOUNTER — Telehealth: Payer: Self-pay

## 2019-02-06 LAB — CBC WITH DIFFERENTIAL/PLATELET
Basophils Absolute: 0.1 10*3/uL (ref 0.0–0.2)
Basos: 1 %
EOS (ABSOLUTE): 0.2 10*3/uL (ref 0.0–0.4)
Eos: 3 %
Hematocrit: 34.3 % (ref 34.0–46.6)
Hemoglobin: 10.2 g/dL — ABNORMAL LOW (ref 11.1–15.9)
Immature Grans (Abs): 0.1 10*3/uL (ref 0.0–0.1)
Immature Granulocytes: 1 %
Lymphocytes Absolute: 2.2 10*3/uL (ref 0.7–3.1)
Lymphs: 30 %
MCH: 23.1 pg — ABNORMAL LOW (ref 26.6–33.0)
MCHC: 29.7 g/dL — ABNORMAL LOW (ref 31.5–35.7)
MCV: 78 fL — ABNORMAL LOW (ref 79–97)
Monocytes Absolute: 0.8 10*3/uL (ref 0.1–0.9)
Monocytes: 11 %
Neutrophils Absolute: 4.1 10*3/uL (ref 1.4–7.0)
Neutrophils: 54 %
Platelets: 466 10*3/uL — ABNORMAL HIGH (ref 150–450)
RBC: 4.41 x10E6/uL (ref 3.77–5.28)
RDW: 16.8 % — ABNORMAL HIGH (ref 11.7–15.4)
WBC: 7.4 10*3/uL (ref 3.4–10.8)

## 2019-02-06 LAB — IRON,TIBC AND FERRITIN PANEL
Ferritin: 11 ng/mL — ABNORMAL LOW (ref 15–150)
Iron Saturation: 6 % — CL (ref 15–55)
Iron: 27 ug/dL (ref 27–139)
Total Iron Binding Capacity: 457 ug/dL — ABNORMAL HIGH (ref 250–450)
UIBC: 430 ug/dL — ABNORMAL HIGH (ref 118–369)

## 2019-02-06 NOTE — Telephone Encounter (Signed)
-----   Message from Jerrol Banana., MD sent at 02/06/2019  8:54 AM EST ----- Labs stable--would stay on iron.

## 2019-02-06 NOTE — Telephone Encounter (Signed)
Called and spoke with patient and informed her that her labs were stable and to continue taking her Iron. She gave verbal understanding.

## 2019-02-07 ENCOUNTER — Other Ambulatory Visit: Payer: Self-pay | Admitting: Family Medicine

## 2019-02-07 DIAGNOSIS — E119 Type 2 diabetes mellitus without complications: Secondary | ICD-10-CM

## 2019-02-14 ENCOUNTER — Other Ambulatory Visit: Payer: Self-pay | Admitting: Family Medicine

## 2019-03-04 DIAGNOSIS — L578 Other skin changes due to chronic exposure to nonionizing radiation: Secondary | ICD-10-CM | POA: Diagnosis not present

## 2019-03-04 DIAGNOSIS — L570 Actinic keratosis: Secondary | ICD-10-CM | POA: Diagnosis not present

## 2019-03-04 DIAGNOSIS — Z872 Personal history of diseases of the skin and subcutaneous tissue: Secondary | ICD-10-CM | POA: Diagnosis not present

## 2019-03-05 DIAGNOSIS — M19012 Primary osteoarthritis, left shoulder: Secondary | ICD-10-CM | POA: Diagnosis not present

## 2019-03-05 DIAGNOSIS — M7582 Other shoulder lesions, left shoulder: Secondary | ICD-10-CM | POA: Diagnosis not present

## 2019-03-06 DIAGNOSIS — Z961 Presence of intraocular lens: Secondary | ICD-10-CM | POA: Diagnosis not present

## 2019-03-06 DIAGNOSIS — H1045 Other chronic allergic conjunctivitis: Secondary | ICD-10-CM | POA: Diagnosis not present

## 2019-03-06 DIAGNOSIS — H04129 Dry eye syndrome of unspecified lacrimal gland: Secondary | ICD-10-CM | POA: Diagnosis not present

## 2019-03-06 DIAGNOSIS — H26492 Other secondary cataract, left eye: Secondary | ICD-10-CM | POA: Diagnosis not present

## 2019-03-06 DIAGNOSIS — H401131 Primary open-angle glaucoma, bilateral, mild stage: Secondary | ICD-10-CM | POA: Diagnosis not present

## 2019-04-07 ENCOUNTER — Ambulatory Visit: Payer: Self-pay | Admitting: Family Medicine

## 2019-04-16 ENCOUNTER — Other Ambulatory Visit: Payer: Self-pay | Admitting: Family Medicine

## 2019-04-28 ENCOUNTER — Other Ambulatory Visit: Payer: Self-pay

## 2019-04-28 ENCOUNTER — Ambulatory Visit (INDEPENDENT_AMBULATORY_CARE_PROVIDER_SITE_OTHER): Payer: Medicare Other | Admitting: Family Medicine

## 2019-04-28 ENCOUNTER — Encounter: Payer: Self-pay | Admitting: Family Medicine

## 2019-04-28 VITALS — BP 103/66 | HR 98 | Temp 96.9°F | Resp 18 | Ht 64.0 in | Wt 199.0 lb

## 2019-04-28 DIAGNOSIS — E7800 Pure hypercholesterolemia, unspecified: Secondary | ICD-10-CM | POA: Diagnosis not present

## 2019-04-28 DIAGNOSIS — I10 Essential (primary) hypertension: Secondary | ICD-10-CM | POA: Diagnosis not present

## 2019-04-28 DIAGNOSIS — M545 Low back pain: Secondary | ICD-10-CM | POA: Diagnosis not present

## 2019-04-28 DIAGNOSIS — G8929 Other chronic pain: Secondary | ICD-10-CM

## 2019-04-28 DIAGNOSIS — M5116 Intervertebral disc disorders with radiculopathy, lumbar region: Secondary | ICD-10-CM

## 2019-04-28 DIAGNOSIS — E039 Hypothyroidism, unspecified: Secondary | ICD-10-CM

## 2019-04-28 DIAGNOSIS — E119 Type 2 diabetes mellitus without complications: Secondary | ICD-10-CM

## 2019-04-28 DIAGNOSIS — J449 Chronic obstructive pulmonary disease, unspecified: Secondary | ICD-10-CM | POA: Diagnosis not present

## 2019-04-28 DIAGNOSIS — D649 Anemia, unspecified: Secondary | ICD-10-CM

## 2019-04-28 NOTE — Progress Notes (Signed)
Patient: Kathryn [name] Female    DOB: 04-Mar-1934   86 y.o.   MRN: [id number] Visit Date: 04/28/2019  Today's Provider: [name], MD   Chief Complaint  Patient presents with  . Follow-up  . Hypertension  . Anemia  . Diabetes  . COPD   Subjective:     HPI  Overall patient feeling fairly well.  She has had her first Covid vaccine dose.  Her blood pressures are at times labile. Anemia, unspecified type From 02/05/2019-May need hematology referral if not coming up with po iron. GI w/u essentially negative. Labs stable--advised would stay on iron.  Essential hypertension From 02/05/2019-no changes.   COPD, mild ([place]) From 02/05/2019-no changes.   Type 2 diabetes mellitus without complication, without long-term current use of insulin (HCC) From 11/05/2018-Hgb A1c 7.6.   Allergies  Allergen Reactions  . Oxycodone Other (See Comments)    Mental status change  . Prednisone     swelling, bruising.     Current Outpatient Medications:  .  acetaminophen (TYLENOL) 500 MG tablet, Take 1,000 mg by mouth every 6 (six) hours as needed. Pain , Disp: , Rfl:  .  amLODipine (NORVASC) 5 MG tablet, TAKE 1 TABLET BY MOUTH EVERY DAY FOR HYPERTENSION, Disp: 30 tablet, Rfl: 11 .  atorvastatin (LIPITOR) 40 MG tablet, TAKE 1 TABLET BY MOUTH EVERY DAY, Disp: 90 tablet, Rfl: 3 .  cetirizine (ZYRTEC) 10 MG tablet, Take 10 mg by mouth at bedtime. , Disp: , Rfl:  .  FEROSUL 325 (65 Fe) MG tablet, TAKE 1 TABLET(325 MG) BY MOUTH DAILY WITH BREAKFAST, Disp: 90 tablet, Rfl: 1 .  glucose blood (CONTOUR NEXT TEST) test strip, Check sugar once daily  DX E11.9, Disp: 100 each, Rfl: 3 .  hydrOXYzine (ATARAX/VISTARIL) 10 MG tablet, TAKE 1 TO 2 TABLETS(10 TO 20 MG) BY MOUTH EVERY 6 HOURS AS NEEDED, Disp: 100 tablet, Rfl: 4 .  levothyroxine (SYNTHROID) 100 MCG tablet, TAKE 1 TABLET(100 MCG) BY MOUTH DAILY BEFORE BREAKFAST, Disp: 90 tablet, Rfl: 1 .  lisinopril (ZESTRIL) 10 MG tablet, TAKE 1  TABLET BY MOUTH EVERY NIGHT AT BEDTIME FOR HYPERTENSION, Disp: 30 tablet, Rfl: 11 .  metFORMIN (GLUCOPHAGE) 1000 MG tablet, TAKE 1 TABLET(1000 MG) BY MOUTH DAILY WITH SUPPER, Disp: 90 tablet, Rfl: 1 .  omeprazole (PRILOSEC) 20 MG capsule, TAKE 1 CAPSULE(20 MG) BY MOUTH DAILY, Disp: 90 capsule, Rfl: 3 .  albuterol (PROVENTIL) (2.5 MG/3ML) 0.083% nebulizer solution, Take 3 mLs (2.5 mg total) by nebulization every 6 (six) hours as needed for wheezing or shortness of breath. (Patient not taking: Reported on 01/28/2019), Disp: 150 mL, Rfl: 12 .  DULoxetine (CYMBALTA) 20 MG capsule, TAKE 1 CAPSULE(20 MG) BY MOUTH DAILY (Patient not taking: Reported on 01/28/2019), Disp: 30 capsule, Rfl: 3 .  esomeprazole (NEXIUM) 40 MG capsule, Take 40 mg by mouth daily at 12 noon., Disp: , Rfl:  .  HYDROcodone-acetaminophen (NORCO/VICODIN) 5-325 MG tablet, Take 1 tablet by mouth every 6 (six) hours as needed for moderate pain. (Patient not taking: Reported on 01/28/2019), Disp: 100 tablet, Rfl: 0 .  polyethylene glycol-electrolytes (NULYTELY/GOLYTELY) 420 g solution, TAKE 4000 MLS PO ONCE FOR 1 DOSE, Disp: , Rfl:  .  tiotropium (SPIRIVA) 18 MCG inhalation capsule, Place 18 mcg into inhaler and inhale daily., Disp: , Rfl:   Review of Systems  Constitutional: Negative for appetite change, chills, fatigue and fever.  HENT: Negative.   Eyes: Negative.   Respiratory: Negative  for chest tightness and shortness of breath.   Cardiovascular: Negative for chest pain and palpitations.  Gastrointestinal: Negative for abdominal pain, nausea and vomiting.  Endocrine: Negative.   Musculoskeletal: Positive for arthralgias and back pain.  Neurological: Negative for dizziness and weakness.  Hematological: Negative.   Psychiatric/Behavioral: Negative.     Social History   Tobacco Use  . Smoking status: Former Smoker    Packs/day: 0.50    Years: 50.00    Pack years: 25.00    Types: Cigarettes    Quit date: 05/16/2004    Years  since quitting: 14.9  . Smokeless tobacco: Never Used  Substance Use Topics  . Alcohol use: Yes    Comment: socially      Objective:   BP 103/66 (BP Location: Right Arm, Cuff Size: Large)   Pulse 98   Temp (!) 96.9 F (36.1 C) (Other (Comment))   Resp 18   Ht 5\' 4"  (1.626 m)   Wt 199 lb (90.3 kg)   SpO2 94%   BMI 34.16 kg/m  Vitals:   04/28/19 1342 04/28/19 1411  BP: (!) 173/79 103/66  Pulse: 96 98  Resp: 18   Temp: (!) 96.9 F (36.1 C)   TempSrc: Other (Comment)   SpO2: 94%   Weight: 199 lb (90.3 kg)   Height: 5\' 4"  (1.626 m)   Body mass index is 34.16 kg/m.   Physical Exam Vitals reviewed.  Constitutional:      Appearance: She is obese.  HENT:     Head: Normocephalic and atraumatic.     Right Ear: External ear normal.     Left Ear: External ear normal.  Eyes:     General: No scleral icterus.    Conjunctiva/sclera: Conjunctivae normal.  Cardiovascular:     Rate and Rhythm: Normal rate and regular rhythm.     Heart sounds: Normal heart sounds.  Pulmonary:     Breath sounds: Normal breath sounds.  Abdominal:     Palpations: Abdomen is soft.  Skin:    General: Skin is warm and dry.  Neurological:     General: No focal deficit present.     Mental Status: She is alert and oriented to person, place, and time.  Psychiatric:        Mood and Affect: Mood normal.        Behavior: Behavior normal.        Thought Content: Thought content normal.        Judgment: Judgment normal.      No results found for any visits on 04/28/19.     Assessment & Plan    1. Anemia, unspecified type Follow-up CBC and iron today.  Return to clinic 3 months - Hemoglobin A1C - Fe+TIBC+Fer - CBC w/Diff/Platelet  2. Type 2 diabetes mellitus without complication, without long-term current use of insulin (HCC) Blood sugars relatively stable per patient - Hemoglobin A1C - Fe+TIBC+Fer - CBC w/Diff/Platelet  3. Essential hypertension Labile in this patient with ASCVD.   Second blood pressure today is 103/66 with a heart rate of 98.  No changes today. - Hemoglobin A1C - Fe+TIBC+Fer - CBC w/Diff/Platelet  4. COPD, mild ([place])   5. Adult hypothyroidism   6. Lumbar disc disease with radiculopathy This affects patient's quality of life daily  7. Hypercholesteremia On atorvastatin  8. Chronic right-sided low back pain without sciatica    Follow up in 3 months.    I,Danylle Ouk,acting as a scribe for [name], MD.,have documented  all relevant documentation on the behalf of [name], MD,as directed by  [name], MD while in the presence of [name], MD.     [name], MD  [facility]

## 2019-04-29 LAB — IRON,TIBC AND FERRITIN PANEL
Ferritin: 21 ng/mL (ref 15–150)
Iron Saturation: 21 % (ref 15–55)
Iron: 84 ug/dL (ref 27–139)
Total Iron Binding Capacity: 403 ug/dL (ref 250–450)
UIBC: 319 ug/dL (ref 118–369)

## 2019-04-29 LAB — CBC WITH DIFFERENTIAL/PLATELET
Basophils Absolute: 0.1 10*3/uL (ref 0.0–0.2)
Basos: 1 %
EOS (ABSOLUTE): 0.2 10*3/uL (ref 0.0–0.4)
Eos: 2 %
Hematocrit: 35.6 % (ref 34.0–46.6)
Hemoglobin: 10.9 g/dL — ABNORMAL LOW (ref 11.1–15.9)
Immature Grans (Abs): 0.1 10*3/uL (ref 0.0–0.1)
Immature Granulocytes: 1 %
Lymphocytes Absolute: 2.2 10*3/uL (ref 0.7–3.1)
Lymphs: 30 %
MCH: 25.4 pg — ABNORMAL LOW (ref 26.6–33.0)
MCHC: 30.6 g/dL — ABNORMAL LOW (ref 31.5–35.7)
MCV: 83 fL (ref 79–97)
Monocytes Absolute: 0.7 10*3/uL (ref 0.1–0.9)
Monocytes: 10 %
Neutrophils Absolute: 4 10*3/uL (ref 1.4–7.0)
Neutrophils: 56 %
Platelets: 336 10*3/uL (ref 150–450)
RBC: 4.29 x10E6/uL (ref 3.77–5.28)
RDW: 15.5 % — ABNORMAL HIGH (ref 11.7–15.4)
WBC: 7.2 10*3/uL (ref 3.4–10.8)

## 2019-04-29 LAB — HEMOGLOBIN A1C
Est. average glucose Bld gHb Est-mCnc: 166 mg/dL
Hgb A1c MFr Bld: 7.4 % — ABNORMAL HIGH (ref 4.8–5.6)

## 2019-06-05 ENCOUNTER — Other Ambulatory Visit: Payer: Self-pay | Admitting: Orthopedic Surgery

## 2019-06-05 DIAGNOSIS — M4807 Spinal stenosis, lumbosacral region: Secondary | ICD-10-CM | POA: Diagnosis not present

## 2019-06-05 DIAGNOSIS — M5416 Radiculopathy, lumbar region: Secondary | ICD-10-CM | POA: Diagnosis not present

## 2019-06-05 DIAGNOSIS — M7582 Other shoulder lesions, left shoulder: Secondary | ICD-10-CM | POA: Diagnosis not present

## 2019-06-05 DIAGNOSIS — M48062 Spinal stenosis, lumbar region with neurogenic claudication: Secondary | ICD-10-CM | POA: Diagnosis not present

## 2019-06-05 DIAGNOSIS — M79605 Pain in left leg: Secondary | ICD-10-CM | POA: Diagnosis not present

## 2019-06-05 DIAGNOSIS — M19012 Primary osteoarthritis, left shoulder: Secondary | ICD-10-CM | POA: Diagnosis not present

## 2019-06-05 DIAGNOSIS — R29898 Other symptoms and signs involving the musculoskeletal system: Secondary | ICD-10-CM | POA: Diagnosis not present

## 2019-06-17 ENCOUNTER — Other Ambulatory Visit: Payer: Self-pay

## 2019-06-17 ENCOUNTER — Ambulatory Visit
Admission: RE | Admit: 2019-06-17 | Discharge: 2019-06-17 | Disposition: A | Discharge status: Home / Self Care | Payer: Medicare Other | Source: Ambulatory Visit | Attending: Orthopedic Surgery | Admitting: Orthopedic Surgery

## 2019-06-17 DIAGNOSIS — M4807 Spinal stenosis, lumbosacral region: Secondary | ICD-10-CM

## 2019-06-17 DIAGNOSIS — M5416 Radiculopathy, lumbar region: Secondary | ICD-10-CM | POA: Diagnosis not present

## 2019-06-17 DIAGNOSIS — M48061 Spinal stenosis, lumbar region without neurogenic claudication: Secondary | ICD-10-CM | POA: Diagnosis not present

## 2019-06-30 DIAGNOSIS — M5416 Radiculopathy, lumbar region: Secondary | ICD-10-CM | POA: Diagnosis not present

## 2019-06-30 DIAGNOSIS — M5136 Other intervertebral disc degeneration, lumbar region: Secondary | ICD-10-CM | POA: Diagnosis not present

## 2019-06-30 DIAGNOSIS — M48062 Spinal stenosis, lumbar region with neurogenic claudication: Secondary | ICD-10-CM | POA: Diagnosis not present

## 2019-07-23 ENCOUNTER — Other Ambulatory Visit: Payer: Self-pay | Admitting: Family Medicine

## 2019-07-23 NOTE — Telephone Encounter (Signed)
Requested Prescriptions  Pending Prescriptions Disp Refills  . lisinopril (ZESTRIL) 10 MG tablet [Pharmacy Med Name: LISINOPRIL 10MG  TABLETS] 30 tablet 11    Sig: TAKE 1 TABLET BY MOUTH EVERY NIGHT AT BEDTIME FOR HIGH BLOOD PRESSURE     Cardiovascular:  ACE Inhibitors Failed - 07/23/2019 11:36 AM      Failed - Cr in normal range and within 180 days    Creatinine  Date Value Ref Range Status  07/09/2014 0.85 mg/dL Final    Comment:    0.44-1.00 NOTE: New Reference Range  06/01/14    Creatinine, Ser  Date Value Ref Range Status  11/05/2018 0.66 0.57 - 1.00 mg/dL Final         Failed - K in normal range and within 180 days    Potassium  Date Value Ref Range Status  11/05/2018 4.9 3.5 - 5.2 mmol/L Final  07/09/2014 4.6 mmol/L Final    Comment:    3.5-5.1 NOTE: New Reference Range  06/01/14          Passed - Patient is not pregnant      Passed - Last BP in normal range    BP Readings from Last 1 Encounters:  04/28/19 103/66         Passed - Valid encounter within last 6 months    Recent Outpatient Visits          2 months ago Anemia, unspecified type   [facility] Jerrol Banana., MD   5 months ago Anemia, unspecified type   [facility] Jerrol Banana., MD   7 months ago Anemia, unspecified type   [facility] Jerrol Banana., MD   8 months ago Anemia, unspecified type   [facility] Jerrol Banana., MD   8 months ago Medicare annual wellness visit, subsequent   [facility] Jerrol Banana., MD      Future Appointments            In 4 days Jerrol Banana., MD [facility] [place]   In 1 month Ralene Bathe, MD [place]

## 2019-07-23 NOTE — Progress Notes (Signed)
Established patient visit  I,[name],acting as a scribe for Kathryn Durie, MD.,have documented all relevant documentation on the behalf of Kathryn Durie, MD,as directed by  Kathryn Durie, MD while in the presence of Kathryn Durie, MD.   Patient: Kathryn [name]   DOB: 1934-02-24   85 y.o. Female  MRN: [id number] Visit Date: 07/27/2019  Today's healthcare provider: [name], MD   Chief Complaint  Patient presents with  . Follow-up  . Diabetes  . Hypertension  . Hyperlipidemia   Subjective    HPI  She complains bitterly of her chronic pain.  She gets no relief with her spinal shots recently.  She sees orthopedics for her knee later this month.  Other than pain she has no complaints.  She asked for something to take the pain away. Diabetes Mellitus Type II, follow-up  Lab Results  Component Value Date   HGBA1C 7.4 (H) 04/28/2019   HGBA1C 7.6 (H) 11/05/2018   HGBA1C 7.2 (H) 01/22/2018   Last seen for diabetes 3 months ago.  Management since then includes; continuing the same treatment. She reports good compliance with treatment. She is not having side effects. none  Home blood sugar records: fasting range: not checking  Episodes of hypoglycemia? No none   Current insulin regiment: n/a Most Recent Eye Exam: 02/2019  --------------------------------------------------------------------  Hypertension, follow-up  BP Readings from Last 3 Encounters:  07/27/19 (!) 153/87  04/28/19 103/66  02/05/19 (!) 145/76   She was last seen for hypertension 3 months ago.  BP at that visit was 103/66. Management since that visit includes; continue current treatment. She reports good compliance with treatment. She is having side effects. edema She is not exercising. She is adherent to low salt diet.   Outside blood pressures are not checking.  She does not smoke.  Use of agents associated with hypertension: none.    --------------------------------------------------------------------  Lipid/Cholesterol, follow-up  Last Lipid Panel: Lab Results  Component Value Date   CHOL 165 11/05/2018   LDLCALC 71 11/05/2018   HDL 60 11/05/2018   TRIG 171 (H) 11/05/2018   ALT 10 11/05/2018   AST 14 11/05/2018   PLT 336 04/28/2019    She was last seen for this 9 months ago.  Management since that visit includes; stable. She reports good compliance with treatment. She is not having side effects. none She is following a Regular diet. Current exercise: none  Wt Readings from Last 3 Encounters:  07/27/19 200 lb (90.7 kg)  04/28/19 199 lb (90.3 kg)  02/05/19 196 lb (88.9 kg)   Last metabolic panel Lab Results  Component Value Date   GLUCOSE 162 (H) 11/05/2018   NA 139 11/05/2018   K 4.9 11/05/2018   BUN 13 11/05/2018   CREATININE 0.66 11/05/2018   GFRNONAA 82 11/05/2018   GFRAA 94 11/05/2018   CALCIUM 9.8 11/05/2018   AST 14 11/05/2018   ALT 10 11/05/2018   The ASCVD Risk score (Goff DC Jr., et al., 2013) failed to calculate for the following reasons:   The 2013 ASCVD risk score is only valid for ages 65 to 49  --------------------------------------------------------------------   Past Medical History:  Diagnosis Date  . Arthritis    spinal stenosis  . COPD (chronic obstructive pulmonary disease) ([place])   . Diabetes mellitus without complication ([place])   . Gastric ulcer 4/12   treated with Prolisec- states no problems now  . Hyperlipidemia   . Hypertension  PCP Dr Miguel Aschoff   [place]  . Hypothyroidism   . Neuromuscular disorder ([place])    slight numbness right toes- comes and goes  . Peripheral vascular disease (HCC)    varicose veins left leg  . Pneumonia   . Seasonal allergies   . Shortness of breath   . Skin cancer    multiple from face       Medications: Outpatient Medications Prior to Visit  Medication Sig  . acetaminophen (TYLENOL) 500 MG tablet Take 1,000  mg by mouth every 6 (six) hours as needed. Pain   . albuterol (PROVENTIL) (2.5 MG/3ML) 0.083% nebulizer solution Take 3 mLs (2.5 mg total) by nebulization every 6 (six) hours as needed for wheezing or shortness of breath.  Marland Kitchen amLODipine (NORVASC) 5 MG tablet TAKE 1 TABLET BY MOUTH EVERY DAY FOR HYPERTENSION  . atorvastatin (LIPITOR) 40 MG tablet TAKE 1 TABLET BY MOUTH EVERY DAY  . DULoxetine (CYMBALTA) 20 MG capsule TAKE 1 CAPSULE(20 MG) BY MOUTH DAILY  . FEROSUL 325 (65 Fe) MG tablet TAKE 1 TABLET(325 MG) BY MOUTH DAILY WITH BREAKFAST  . hydrOXYzine (ATARAX/VISTARIL) 10 MG tablet TAKE 1 TO 2 TABLETS(10 TO 20 MG) BY MOUTH EVERY 6 HOURS AS NEEDED  . levothyroxine (SYNTHROID) 100 MCG tablet TAKE 1 TABLET(100 MCG) BY MOUTH DAILY BEFORE BREAKFAST  . lisinopril (ZESTRIL) 10 MG tablet TAKE 1 TABLET BY MOUTH EVERY NIGHT AT BEDTIME FOR HIGH BLOOD PRESSURE  . metFORMIN (GLUCOPHAGE) 1000 MG tablet TAKE 1 TABLET(1000 MG) BY MOUTH DAILY WITH SUPPER  . omeprazole (PRILOSEC) 20 MG capsule TAKE 1 CAPSULE(20 MG) BY MOUTH DAILY  . cetirizine (ZYRTEC) 10 MG tablet Take 10 mg by mouth at bedtime.   Marland Kitchen esomeprazole (NEXIUM) 40 MG capsule Take 40 mg by mouth daily at 12 noon.  Marland Kitchen glucose blood (CONTOUR NEXT TEST) test strip Check sugar once daily  DX E11.9 (Patient not taking: Reported on 07/27/2019)  . HYDROcodone-acetaminophen (NORCO/VICODIN) 5-325 MG tablet Take 1 tablet by mouth every 6 (six) hours as needed for moderate pain. (Patient not taking: Reported on 01/28/2019)  . polyethylene glycol-electrolytes (NULYTELY/GOLYTELY) 420 g solution TAKE 4000 MLS PO ONCE FOR 1 DOSE  . tiotropium (SPIRIVA) 18 MCG inhalation capsule Place 18 mcg into inhaler and inhale daily.   No facility-administered medications prior to visit.    Review of Systems  Constitutional: Negative for appetite change, chills, fatigue and fever.  Eyes: Negative.   Respiratory: Negative for chest tightness and shortness of breath.    Cardiovascular: Negative for chest pain and palpitations.  Gastrointestinal: Negative for abdominal pain, nausea and vomiting.  Endocrine: Negative.   Musculoskeletal: Positive for arthralgias and back pain.  Allergic/Immunologic: Negative.   Neurological: Negative for dizziness and weakness.  Hematological: Negative.   Psychiatric/Behavioral: Negative.     Last hemoglobin A1c Lab Results  Component Value Date   HGBA1C 7.7 (A) 07/27/2019      Objective    BP (!) 153/87 (BP Location: Left Arm, Patient Position: Sitting, Cuff Size: Large)   Pulse 87   Temp (!) 97.5 F (36.4 C) (Other (Comment))   Resp 18   Ht 5\' 4"  (1.626 m)   Wt 200 lb (90.7 kg)   SpO2 96%   BMI 34.33 kg/m  BP Readings from Last 3 Encounters:  07/27/19 (!) 153/87  04/28/19 103/66  02/05/19 (!) 145/76   Wt Readings from Last 3 Encounters:  07/27/19 200 lb (90.7 kg)  04/28/19 199 lb (90.3 kg)  02/05/19 196  lb (88.9 kg)      Physical Exam Vitals and nursing note reviewed.  Constitutional:      Appearance: Normal appearance. She is normal weight.  HENT:     Right Ear: Tympanic membrane normal.     Left Ear: Tympanic membrane normal.     Nose: Nose normal.     Mouth/Throat:     Mouth: Mucous membranes are moist.     Pharynx: Oropharynx is clear.  Eyes:     General: No scleral icterus.    Conjunctiva/sclera: Conjunctivae normal.  Cardiovascular:     Rate and Rhythm: Normal rate and regular rhythm.     Pulses: Normal pulses.     Heart sounds: Normal heart sounds.  Pulmonary:     Effort: Pulmonary effort is normal.     Breath sounds: Normal breath sounds.  Abdominal:     General: Bowel sounds are normal.     Palpations: Abdomen is soft.  Musculoskeletal:        General: Normal range of motion.     Cervical back: Normal range of motion and neck supple.     Right lower leg: Edema present.     Left lower leg: Edema present.     Comments: 1+ fecal edema in ankles  Skin:    General: Skin is  warm and dry.  Neurological:     Mental Status: She is alert.  Psychiatric:        Mood and Affect: Mood normal.        Behavior: Behavior normal.        Thought Content: Thought content normal.        Judgment: Judgment normal.       No results found for any visits on 07/27/19.  Assessment & Plan     1. Type 2 diabetes mellitus without complication, without long-term current use of insulin (HCC) A1c is 7.7.  No changes in medications at this time. - POCT glycosylated hemoglobin (Hb A1C) - CBC w/Diff/Platelet - Fe+TIBC+Fer - Lipid panel - TSH - Comprehensive Metabolic Panel (CMET)  2. Essential hypertension Fair control on amlodipine and lisinopril Consider stopping amlodipine and increasing lisinopril dose in the future as she does have ankle and pedal edema that I think it is from the amlodipine the least partially - CBC w/Diff/Platelet - Fe+TIBC+Fer - Lipid panel - TSH - Comprehensive Metabolic Panel (CMET)  3. Hypercholesteremia On atorvastatin 40 - CBC w/Diff/Platelet - Fe+TIBC+Fer - Lipid panel - TSH - Comprehensive Metabolic Panel (CMET)  4. Anemia, unspecified type Follow-up CBC and iron studies - CBC w/Diff/Platelet - Fe+TIBC+Fer - Lipid panel - TSH - Comprehensive Metabolic Panel (CMET)  5. Spinal stenosis of lumbar region without neurogenic claudication Followed by pain clinic  6. Osteoarthritis, unspecified osteoarthritis type, unspecified site Try turmeric daily  7. COPD, mild ([place]) Presently asymptomatic.  She stopped smoking years ago.   No follow-ups on file.      I, Kathryn Durie, MD, have reviewed all documentation for this visit. The documentation on 07/28/19 for the exam, diagnosis, procedures, and orders are all accurate and complete.    Mylissa Lambe Cranford Mon, MD  [facility] [phone] (phone) [phone] (fax)  [place]

## 2019-07-27 ENCOUNTER — Encounter: Payer: Self-pay | Admitting: Family Medicine

## 2019-07-27 ENCOUNTER — Ambulatory Visit (INDEPENDENT_AMBULATORY_CARE_PROVIDER_SITE_OTHER): Payer: Medicare Other | Admitting: Family Medicine

## 2019-07-27 ENCOUNTER — Other Ambulatory Visit: Payer: Self-pay

## 2019-07-27 VITALS — BP 153/87 | HR 87 | Temp 97.5°F | Resp 18 | Ht 64.0 in | Wt 200.0 lb

## 2019-07-27 DIAGNOSIS — E119 Type 2 diabetes mellitus without complications: Secondary | ICD-10-CM | POA: Diagnosis not present

## 2019-07-27 DIAGNOSIS — M48061 Spinal stenosis, lumbar region without neurogenic claudication: Secondary | ICD-10-CM | POA: Diagnosis not present

## 2019-07-27 DIAGNOSIS — I10 Essential (primary) hypertension: Secondary | ICD-10-CM

## 2019-07-27 DIAGNOSIS — M1990 Unspecified osteoarthritis, unspecified site: Secondary | ICD-10-CM

## 2019-07-27 DIAGNOSIS — E7800 Pure hypercholesterolemia, unspecified: Secondary | ICD-10-CM | POA: Diagnosis not present

## 2019-07-27 DIAGNOSIS — J449 Chronic obstructive pulmonary disease, unspecified: Secondary | ICD-10-CM

## 2019-07-27 DIAGNOSIS — D649 Anemia, unspecified: Secondary | ICD-10-CM | POA: Diagnosis not present

## 2019-07-27 LAB — POCT GLYCOSYLATED HEMOGLOBIN (HGB A1C)
Est. average glucose Bld gHb Est-mCnc: 174
Hemoglobin A1C: 7.7 % — AB (ref 4.0–5.6)

## 2019-07-27 NOTE — Patient Instructions (Addendum)
For Arthritis try over-the-counter Turmeric for one month.

## 2019-07-29 DIAGNOSIS — E119 Type 2 diabetes mellitus without complications: Secondary | ICD-10-CM | POA: Diagnosis not present

## 2019-07-29 DIAGNOSIS — I10 Essential (primary) hypertension: Secondary | ICD-10-CM | POA: Diagnosis not present

## 2019-07-29 DIAGNOSIS — E7800 Pure hypercholesterolemia, unspecified: Secondary | ICD-10-CM | POA: Diagnosis not present

## 2019-07-29 DIAGNOSIS — D649 Anemia, unspecified: Secondary | ICD-10-CM | POA: Diagnosis not present

## 2019-07-30 LAB — CBC WITH DIFFERENTIAL/PLATELET
Basophils Absolute: 0.1 10*3/uL (ref 0.0–0.2)
Basos: 1 %
EOS (ABSOLUTE): 0.3 10*3/uL (ref 0.0–0.4)
Eos: 5 %
Hematocrit: 37.8 % (ref 34.0–46.6)
Hemoglobin: 11.1 g/dL (ref 11.1–15.9)
Immature Grans (Abs): 0.1 10*3/uL (ref 0.0–0.1)
Immature Granulocytes: 1 %
Lymphocytes Absolute: 1.8 10*3/uL (ref 0.7–3.1)
Lymphs: 29 %
MCH: 24.8 pg — ABNORMAL LOW (ref 26.6–33.0)
MCHC: 29.4 g/dL — ABNORMAL LOW (ref 31.5–35.7)
MCV: 85 fL (ref 79–97)
Monocytes Absolute: 0.7 10*3/uL (ref 0.1–0.9)
Monocytes: 10 %
Neutrophils Absolute: 3.4 10*3/uL (ref 1.4–7.0)
Neutrophils: 54 %
Platelets: 281 10*3/uL (ref 150–450)
RBC: 4.47 x10E6/uL (ref 3.77–5.28)
RDW: 16.3 % — ABNORMAL HIGH (ref 11.7–15.4)
WBC: 6.3 10*3/uL (ref 3.4–10.8)

## 2019-07-30 LAB — IRON,TIBC AND FERRITIN PANEL
Ferritin: 19 ng/mL (ref 15–150)
Iron Saturation: 7 % — CL (ref 15–55)
Iron: 29 ug/dL (ref 27–139)
Total Iron Binding Capacity: 405 ug/dL (ref 250–450)
UIBC: 376 ug/dL — ABNORMAL HIGH (ref 118–369)

## 2019-07-30 LAB — COMPREHENSIVE METABOLIC PANEL
ALT: 12 IU/L (ref 0–32)
AST: 18 IU/L (ref 0–40)
Albumin/Globulin Ratio: 1.6 (ref 1.2–2.2)
Albumin: 4.2 g/dL (ref 3.6–4.6)
Alkaline Phosphatase: 68 IU/L (ref 39–117)
BUN/Creatinine Ratio: 17 (ref 12–28)
BUN: 13 mg/dL (ref 8–27)
Bilirubin Total: 0.2 mg/dL (ref 0.0–1.2)
CO2: 20 mmol/L (ref 20–29)
Calcium: 9.9 mg/dL (ref 8.7–10.3)
Chloride: 103 mmol/L (ref 96–106)
Creatinine, Ser: 0.76 mg/dL (ref 0.57–1.00)
GFR calc Af Amer: 84 mL/min/{1.73_m2} (ref >59)
GFR calc non Af Amer: 73 mL/min/{1.73_m2} (ref >59)
Globulin, Total: 2.7 g/dL (ref 1.5–4.5)
Glucose: 189 mg/dL — ABNORMAL HIGH (ref 65–99)
Potassium: 4.9 mmol/L (ref 3.5–5.2)
Sodium: 140 mmol/L (ref 134–144)
Total Protein: 6.9 g/dL (ref 6.0–8.5)

## 2019-07-30 LAB — LIPID PANEL
Chol/HDL Ratio: 2.8 ratio (ref 0.0–4.4)
Cholesterol, Total: 167 mg/dL (ref 100–199)
HDL: 59 mg/dL (ref >39)
LDL Chol Calc (NIH): 81 mg/dL (ref 0–99)
Triglycerides: 160 mg/dL — ABNORMAL HIGH (ref 0–149)
VLDL Cholesterol Cal: 27 mg/dL (ref 5–40)

## 2019-07-30 LAB — TSH: TSH: 2.34 u[IU]/mL (ref 0.450–4.500)

## 2019-08-04 ENCOUNTER — Other Ambulatory Visit: Payer: Self-pay | Admitting: Family Medicine

## 2019-08-04 DIAGNOSIS — E119 Type 2 diabetes mellitus without complications: Secondary | ICD-10-CM

## 2019-08-04 NOTE — Telephone Encounter (Signed)
Requested Prescriptions  Pending Prescriptions Disp Refills  . metFORMIN (GLUCOPHAGE) 1000 MG tablet [Pharmacy Med Name: METFORMIN 1000MG TABLETS] 90 tablet 1    Sig: TAKE 1 TABLET(1000 MG) BY MOUTH DAILY WITH SUPPER     Endocrinology:  Diabetes - Biguanides Passed - 08/04/2019  3:28 PM      Passed - Cr in normal range and within 360 days    Creatinine  Date Value Ref Range Status  07/09/2014 0.85 mg/dL Final    Comment:    0.44-1.00 NOTE: New Reference Range  06/01/14    Creatinine, Ser  Date Value Ref Range Status  07/29/2019 0.76 0.57 - 1.00 mg/dL Final         Passed - HBA1C is between 0 and 7.9 and within 180 days    Hemoglobin A1C  Date Value Ref Range Status  07/27/2019 7.7 (A) 4.0 - 5.6 % Final   Hgb A1c MFr Bld  Date Value Ref Range Status  04/28/2019 7.4 (H) 4.8 - 5.6 % Final    Comment:             Prediabetes: 5.7 - 6.4          Diabetes: >6.4          Glycemic control for adults with diabetes: <7.0          Passed - eGFR in normal range and within 360 days    EGFR (African American)  Date Value Ref Range Status  07/09/2014 >60  Final   GFR calc Af Amer  Date Value Ref Range Status  07/29/2019 84 >59 mL/min/1.73 Final    Comment:    **[facility] currently reports eGFR in compliance with the current**   recommendations of the [employer]. [facility] will   update reporting as new guidelines are published from the NKF-ASN   Task force.    EGFR (Non-African Amer.)  Date Value Ref Range Status  07/09/2014 >60  Final    Comment:    eGFR values <79m/min/1.73 m2 may be an indication of chronic kidney disease (CKD). Calculated eGFR is useful in patients with stable renal function. The eGFR calculation will not be reliable in acutely ill patients when serum creatinine is changing rapidly. It is not useful in patients on dialysis. The eGFR calculation may not be applicable to patients at the low and high extremes of body sizes, pregnant women,  and vegetarians.    GFR calc non Af Amer  Date Value Ref Range Status  07/29/2019 73 >59 mL/min/1.73 Final         Passed - Valid encounter within last 6 months    Recent Outpatient Visits          1 week ago Type 2 diabetes mellitus without complication, without long-term current use of insulin ([facility]   3 months ago Anemia, unspecified type   [facility]   6 months ago Anemia, unspecified type   [facility]   8 months ago Anemia, unspecified type   [facility]   8 months ago Anemia, unspecified type   [facility]      Future Appointments            In 1 month KRalene Bathe MD [place]  In 3 months GJerrol Banana, MD  [employer], [place]

## 2019-08-06 DIAGNOSIS — M25461 Effusion, right knee: Secondary | ICD-10-CM | POA: Diagnosis not present

## 2019-08-06 DIAGNOSIS — M25561 Pain in right knee: Secondary | ICD-10-CM | POA: Diagnosis not present

## 2019-08-06 DIAGNOSIS — G8929 Other chronic pain: Secondary | ICD-10-CM | POA: Diagnosis not present

## 2019-08-06 DIAGNOSIS — M1711 Unilateral primary osteoarthritis, right knee: Secondary | ICD-10-CM | POA: Diagnosis not present

## 2019-08-06 DIAGNOSIS — M7051 Other bursitis of knee, right knee: Secondary | ICD-10-CM | POA: Diagnosis not present

## 2019-08-17 ENCOUNTER — Other Ambulatory Visit: Payer: Self-pay | Admitting: Family Medicine

## 2019-08-17 NOTE — Telephone Encounter (Signed)
Requested Prescriptions  Pending Prescriptions Disp Refills  . levothyroxine (SYNTHROID) 100 MCG tablet [Pharmacy Med Name: LEVOTHYROXINE 0.100MG  (100MCG) TAB] 90 tablet 3    Sig: TAKE 1 TABLET(100 MCG) BY MOUTH DAILY BEFORE BREAKFAST     Endocrinology:  Hypothyroid Agents Failed - 08/17/2019 11:53 AM      Failed - TSH needs to be rechecked within 3 months after an abnormal result. Refill until TSH is due.      Passed - TSH in normal range and within 360 days    TSH  Date Value Ref Range Status  07/29/2019 2.340 0.450 - 4.500 uIU/mL Final         Passed - Valid encounter within last 12 months    Recent Outpatient Visits          3 weeks ago Type 2 diabetes mellitus without complication, without long-term current use of insulin [facility])   [facility] Jerrol Banana., MD   3 months ago Anemia, unspecified type   [facility] Jerrol Banana., MD   6 months ago Anemia, unspecified type   [facility] Jerrol [facility] MD   8 months ago Anemia, unspecified type   [facility] Jerrol Banana., MD   9 months ago Anemia, unspecified type   [facility] Jerrol Banana., MD      Future Appointments            In 3 weeks Ralene Bathe, MD [place]   In 3 months Jerrol Banana., MD [facility]           . DULoxetine (CYMBALTA) 20 MG capsule [Pharmacy Med Name: DULOXETINE DR 20MG  CAPSULES] 30 capsule 3    Sig: TAKE 1 CAPSULE(20 MG) BY MOUTH DAILY     Psychiatry: Antidepressants - SNRI Failed - 08/17/2019 11:53 AM      Failed - Last BP in normal range    BP Readings from Last 1 Encounters:  07/27/19 (!) 153/87         Passed - Completed PHQ-2 or PHQ-9 in the last 360 days.      Passed - Valid encounter within last 6 months    Recent Outpatient Visits          3 weeks ago Type 2 diabetes mellitus without complication, without long-term  current use of insulin [facility])   [facility] Jerrol Banana., MD   3 months ago Anemia, unspecified type   [facility] Jerrol Banana., MD   6 months ago Anemia, unspecified type   [facility] Jerrol Banana., MD   8 months ago Anemia, unspecified type   [facility] Jerrol Banana., MD   9 months ago Anemia, unspecified type   [facility] Jerrol Banana., MD      Future Appointments            In 3 weeks Ralene Bathe, MD [place]   In 3 months Jerrol Banana., MD [facility], [place]

## 2019-09-07 ENCOUNTER — Other Ambulatory Visit: Payer: Self-pay

## 2019-09-07 ENCOUNTER — Ambulatory Visit (INDEPENDENT_AMBULATORY_CARE_PROVIDER_SITE_OTHER): Payer: Medicare Other | Admitting: Dermatology

## 2019-09-07 DIAGNOSIS — L570 Actinic keratosis: Secondary | ICD-10-CM

## 2019-09-07 DIAGNOSIS — B079 Viral wart, unspecified: Secondary | ICD-10-CM

## 2019-09-07 DIAGNOSIS — L578 Other skin changes due to chronic exposure to nonionizing radiation: Secondary | ICD-10-CM | POA: Diagnosis not present

## 2019-09-07 DIAGNOSIS — C44629 Squamous cell carcinoma of skin of left upper limb, including shoulder: Secondary | ICD-10-CM

## 2019-09-07 DIAGNOSIS — D485 Neoplasm of uncertain behavior of skin: Secondary | ICD-10-CM

## 2019-09-07 DIAGNOSIS — D0461 Carcinoma in situ of skin of right upper limb, including shoulder: Secondary | ICD-10-CM | POA: Diagnosis not present

## 2019-09-07 NOTE — Patient Instructions (Addendum)
Wound Care Instructions  1. Cleanse wound gently with soap and water once a day then pat dry with clean gauze. Apply a thing coat of Petrolatum (petroleum jelly, "Vaseline") over the wound (unless you have an allergy to this). We recommend that you use a new, sterile tube of Vaseline. Do not pick or remove scabs. Do not remove the yellow or white "healing tissue" from the base of the wound.  2. Cover the wound with fresh, clean, nonstick gauze and secure with paper tape. You may use Band-Aids in place of gauze and tape if the would is small enough, but would recommend trimming much of the tape off as there is often too much. Sometimes Band-Aids can irritate the skin.  3. You should call the office for your biopsy report after 1 week if you have not already been contacted.  4. If you experience any problems, such as abnormal amounts of bleeding, swelling, significant bruising, significant pain, or evidence of infection, please call the office immediately.  5. FOR ADULT SURGERY PATIENTS: If you need something for pain relief you may take 1 extra strength Tylenol (acetaminophen) AND 2 Ibuprofen (200mg  each) together every 4 hours as needed for pain. (do not take these if you are allergic to them or if you have a reason you should not take them.) Typically, you may only need pain medication for 1 to 3 days.    Recommend daily broad spectrum sunscreen SPF 30+ to sun-exposed areas, reapply every 2 hours as needed. Call for new or changing lesions.  Cryotherapy Aftercare  . Wash gently with soap and water everyday.   Marland Kitchen Apply Vaseline and Band-Aid daily until healed.

## 2019-09-07 NOTE — Progress Notes (Signed)
Follow-Up Visit   Subjective  Kathryn [name] is a 86 y.o. female who presents for the following: Skin Problem.  Patient here today to have some spots checked. Spot at left forearm has been there for about 1 month, is sore and has gotten bigger. Spots at right hand and left lower leg are irritated and present for more than one month.   The following portions of the chart were reviewed this encounter and updated as appropriate:  Tobacco  Allergies  Meds  Problems  Med Hx  Surg Hx  Fam Hx      Review of Systems:  No other skin or systemic complaints except as noted in HPI or Assessment and Plan.  Objective  Well appearing patient in no apparent distress; mood and affect are within normal limits.  A focused examination was performed including arms, legs, hands, face. Relevant physical exam findings are noted in the Assessment and Plan.  Objective  Left calf: 1.2cm hyperkeratotic papule  Objective  Left Forearm: 1.2cm hyperkeratotic papule  Objective  Right Dorsal Hand: 1.0cm  Objective  Arms, hands x 19 (19): Erythematous thin papules/macules with gritty scale.    Assessment & Plan  Neoplasm of uncertain behavior of skin (3) Left calf  Epidermal / dermal shaving  Lesion diameter (cm):  1.2 Informed consent: discussed and consent obtained   Timeout: patient name, date of birth, surgical site, and procedure verified   Procedure prep:  Patient was prepped and draped in usual sterile fashion Prep type:  Isopropyl alcohol Anesthesia: the lesion was anesthetized in a standard fashion   Anesthetic:  1% lidocaine w/ epinephrine 1-100,000 buffered w/ 8.4% NaHCO3 Instrument used: flexible razor blade   Hemostasis achieved with: pressure, aluminum chloride and electrodesiccation   Outcome: patient tolerated procedure well   Post-procedure details: sterile dressing applied and wound care instructions given   Dressing type: bandage and petrolatum    Destruction of  lesion Complexity: extensive   Destruction method: electrodesiccation and curettage   Informed consent: discussed and consent obtained   Timeout:  patient name, date of birth, surgical site, and procedure verified Procedure prep:  Patient was prepped and draped in usual sterile fashion Prep type:  Isopropyl alcohol Anesthesia: the lesion was anesthetized in a standard fashion   Anesthetic:  1% lidocaine w/ epinephrine 1-100,000 buffered w/ 8.4% NaHCO3 Curettage performed in three different directions: Yes   Electrodesiccation performed over the curetted area: Yes   Lesion length (cm):  1.2 Lesion width (cm):  1.2 Margin per side (cm):  0.2 Final wound size (cm):  1.6 Hemostasis achieved with:  pressure, aluminum chloride and electrodesiccation Outcome: patient tolerated procedure well with no complications   Post-procedure details: sterile dressing applied and wound care instructions given   Dressing type: bandage and petrolatum    Specimen 1 - Surgical pathology Differential Diagnosis: r/o SCC Check Margins: No 1.2cm hyperkeratotic papule  Left Forearm  Epidermal / dermal shaving  Lesion diameter (cm):  1.2 Informed consent: discussed and consent obtained   Timeout: patient name, date of birth, surgical site, and procedure verified   Procedure prep:  Patient was prepped and draped in usual sterile fashion Prep type:  Isopropyl alcohol Anesthesia: the lesion was anesthetized in a standard fashion   Anesthetic:  1% lidocaine w/ epinephrine 1-100,000 buffered w/ 8.4% NaHCO3 Instrument used: flexible razor blade   Hemostasis achieved with: pressure, aluminum chloride and electrodesiccation   Outcome: patient tolerated procedure well   Post-procedure details: sterile dressing applied  and wound care instructions given   Dressing type: bandage and petrolatum    Destruction of lesion Complexity: extensive   Destruction method: electrodesiccation and curettage   Informed consent:  discussed and consent obtained   Timeout:  patient name, date of birth, surgical site, and procedure verified Procedure prep:  Patient was prepped and draped in usual sterile fashion Prep type:  Isopropyl alcohol Anesthesia: the lesion was anesthetized in a standard fashion   Anesthetic:  1% lidocaine w/ epinephrine 1-100,000 buffered w/ 8.4% NaHCO3 Curettage performed in three different directions: Yes   Electrodesiccation performed over the curetted area: Yes   Lesion length (cm):  1.2 Lesion width (cm):  1.2 Margin per side (cm):  0.2 Final wound size (cm):  1.6 Hemostasis achieved with:  pressure, aluminum chloride and electrodesiccation Outcome: patient tolerated procedure well with no complications   Post-procedure details: sterile dressing applied and wound care instructions given   Dressing type: bandage and petrolatum    Specimen 2 - Surgical pathology Differential Diagnosis: r/o SCC Check Margins: No 1.2cm hyperkeratotic papule  Right Dorsal Hand  Epidermal / dermal shaving  Lesion diameter (cm):  1 Informed consent: discussed and consent obtained   Timeout: patient name, date of birth, surgical site, and procedure verified   Procedure prep:  Patient was prepped and draped in usual sterile fashion Prep type:  Isopropyl alcohol Anesthesia: the lesion was anesthetized in a standard fashion   Anesthetic:  1% lidocaine w/ epinephrine 1-100,000 buffered w/ 8.4% NaHCO3 Instrument used: flexible razor blade   Hemostasis achieved with: pressure, aluminum chloride and electrodesiccation   Outcome: patient tolerated procedure well   Post-procedure details: sterile dressing applied and wound care instructions given   Dressing type: bandage and petrolatum    Destruction of lesion Complexity: extensive   Destruction method: electrodesiccation and curettage   Informed consent: discussed and consent obtained   Timeout:  patient name, date of birth, surgical site, and procedure  verified Procedure prep:  Patient was prepped and draped in usual sterile fashion Prep type:  Isopropyl alcohol Anesthesia: the lesion was anesthetized in a standard fashion   Anesthetic:  1% lidocaine w/ epinephrine 1-100,000 buffered w/ 8.4% NaHCO3 Curettage performed in three different directions: Yes   Electrodesiccation performed over the curetted area: Yes   Lesion length (cm):  1 Lesion width (cm):  1 Margin per side (cm):  0.2 Final wound size (cm):  1.4 Hemostasis achieved with:  pressure, aluminum chloride and electrodesiccation Outcome: patient tolerated procedure well with no complications   Post-procedure details: sterile dressing applied and wound care instructions given   Dressing type: bandage and petrolatum    Specimen 3 - Surgical pathology Differential Diagnosis: r/o SCC Check Margins: No 1.0cm  AK (actinic keratosis) (19) Arms, hands x 19  Destruction of lesion - Arms, hands x 19 Complexity: simple   Destruction method: cryotherapy   Informed consent: discussed and consent obtained   Timeout:  patient name, date of birth, surgical site, and procedure verified Lesion destroyed using liquid nitrogen: Yes   Region frozen until ice ball extended beyond lesion: Yes   Outcome: patient tolerated procedure well with no complications   Post-procedure details: wound care instructions given    Squamous cell carcinoma in situ (SCCIS) of dorsum of right hand  SCC (squamous cell carcinoma), arm, left   Actinic Damage - diffuse scaly erythematous macules with underlying dyspigmentation - Recommend daily broad spectrum sunscreen SPF 30+ to sun-exposed areas, reapply every 2 hours as needed.  -  Call for new or changing lesions.  Return in about 6 months (around 03/08/2020).  Graciella Belton, RMA, am acting as scribe for Sarina Ser, MD . Documentation: I have reviewed the above documentation for accuracy and completeness, and I agree with the above.  Sarina Ser, MD

## 2019-09-07 NOTE — Progress Notes (Signed)
Established patient visit   Patient: Kathryn [name]   DOB: [birth year]   86 y.o. Female  MRN: [id number] Visit Date: 09/08/2019  Today's healthcare provider: Vernie Murders, PA   Chief Complaint  Patient presents with   Leg Swelling   Subjective    HPI Patient here today C/O persistent swelling all over but worsening on ankles. Patient reports leg pain worsening. patient reports dual headache daily. Patient reports drinking well and urinating well. Patient reports shortness of breath with activity.   Patient Active Problem List   Diagnosis Date Noted   Diabetes mellitus type 2, uncomplicated ([place]) 83/11/4074   COPD, mild ([place]) 09/17/2014   Clinical depression 09/17/2014   Elevated platelet count 09/17/2014   Elevated erythrocyte sedimentation rate 09/17/2014   History of tobacco use 09/17/2014   Hypercholesteremia 09/17/2014   BP (high blood pressure) 09/17/2014   Adult hypothyroidism 09/17/2014   Cannot sleep 09/17/2014   Lumbar disc disease with radiculopathy 09/17/2014   Arthritis, degenerative 09/17/2014   Excess weight 09/17/2014   Vertebrobasilar circulation transient ischemic attack 09/17/2014   Lung nodule, multiple 09/17/2014   Basilar artery insufficiency 09/17/2014   Neuritis or radiculitis due to rupture of lumbar intervertebral disc 08/25/2013   Degenerative arthritis of lumbar spine 08/25/2013   Lumbar canal stenosis 08/25/2013   Smoker 07/15/2012   Back pain 07/15/2012   SOB (shortness of breath) 07/15/2012   Hyperlipidemia 07/15/2012   Essential hypertension 07/15/2012   Past Medical History:  Diagnosis Date   Actinic keratosis    Arthritis    spinal stenosis   COPD (chronic obstructive pulmonary disease) (HCC)    Diabetes mellitus without complication ([place])    Gastric ulcer 4/12   treated with Prolisec- states no problems now   Hyperlipidemia    Hypertension    PCP Dr Miguel Aschoff   [place]   Hypothyroidism    Neuromuscular  disorder ([place])    slight numbness right toes- comes and goes   Peripheral vascular disease (HCC)    varicose veins left leg   Pneumonia    Seasonal allergies    Shortness of breath    Skin cancer    multiple from face   Squamous cell carcinoma of skin 03/23/2013   L dorsal hand   Squamous cell carcinoma of skin 02/04/2014   R forearm/in situ, L pretibial   Squamous cell carcinoma of skin 09/24/2018   R thumb   Past Surgical History:  Procedure Laterality Date   BACK SURGERY     4/12  Dr Shellia Carwin- for lumbar stenosis   BREAST CYST ASPIRATION Left 1965   negative   [place]   lumpectomy with biopsy   left   CARPAL TUNNEL RELEASE     right   COLONOSCOPY     COLONOSCOPY WITH PROPOFOL N/A 06/30/2015   Procedure: COLONOSCOPY WITH PROPOFOL;  Surgeon: Hulen Luster, MD;  Location: [facility] ENDOSCOPY;  Service: Gastroenterology;  Laterality: N/A;   COLONOSCOPY WITH PROPOFOL N/A 01/22/2017   Procedure: COLONOSCOPY WITH PROPOFOL;  Surgeon: Lollie Sails, MD;  Location: [facility] ENDOSCOPY;  Service: Endoscopy;  Laterality: N/A;   COLONOSCOPY WITH PROPOFOL N/A 01/28/2019   Procedure: COLONOSCOPY WITH PROPOFOL;  Surgeon: Toledo, Benay Pike, MD;  Location: [facility] ENDOSCOPY;  Service: Gastroenterology;  Laterality: N/A;   ESOPHAGOGASTRODUODENOSCOPY     ESOPHAGOGASTRODUODENOSCOPY (EGD) WITH PROPOFOL N/A 01/28/2019   Procedure: ESOPHAGOGASTRODUODENOSCOPY (EGD) WITH PROPOFOL;  Surgeon: Toledo, Benay Pike, MD;  Location: [facility] ENDOSCOPY;  Service:  Gastroenterology;  Laterality: N/A;   EYE SURGERY     cataract extraction with IOL bilaterally   JOINT REPLACEMENT     LUMBAR LAMINECTOMY/DECOMPRESSION MICRODISCECTOMY  05/24/2011   Procedure: LUMBAR LAMINECTOMY/DECOMPRESSION MICRODISCECTOMY 2 LEVELS;  Surgeon: Magnus Sinning, MD;  Location: [facility] ORS;  Service: Orthopedics;  Laterality: N/A;  L2-L3, L3-L4 (x-ray)   RIGHT OOPHORECTOMY     due to STAPH INFECTION   Family History  Problem Relation Age of  Onset   Dementia Mother    Lung cancer Father    Heart disease Father    Breast cancer Maternal Aunt    Arthritis Son    Social History   Tobacco Use   Smoking status: Former Smoker    Packs/day: 0.50    Years: 50.00    Pack years: 25.00    Types: Cigarettes    Quit date: 05/16/2004    Years since quitting: 15.3   Smokeless tobacco: Never Used  Substance Use Topics   Alcohol use: Yes    Comment: socially   Drug use: No     Allergies  Allergen Reactions   Oxycodone Other (See Comments)    Mental status change   Prednisone     swelling, bruising.    Medications: Outpatient Medications Prior to Visit  Medication Sig   acetaminophen (TYLENOL) 500 MG tablet Take 1,000 mg by mouth every 6 (six) hours as needed. Pain    albuterol (PROVENTIL) (2.5 MG/3ML) 0.083% nebulizer solution Take 3 mLs (2.5 mg total) by nebulization every 6 (six) hours as needed for wheezing or shortness of breath.   amLODipine (NORVASC) 5 MG tablet TAKE 1 TABLET BY MOUTH EVERY DAY FOR HYPERTENSION   atorvastatin (LIPITOR) 40 MG tablet TAKE 1 TABLET BY MOUTH EVERY DAY   cetirizine (ZYRTEC) 10 MG tablet Take 10 mg by mouth at bedtime.    DULoxetine (CYMBALTA) 20 MG capsule TAKE 1 CAPSULE(20 MG) BY MOUTH DAILY   esomeprazole (NEXIUM) 40 MG capsule Take 40 mg by mouth daily at 12 noon.   hydrOXYzine (ATARAX/VISTARIL) 10 MG tablet TAKE 1 TO 2 TABLETS(10 TO 20 MG) BY MOUTH EVERY 6 HOURS AS NEEDED   levothyroxine (SYNTHROID) 100 MCG tablet TAKE 1 TABLET(100 MCG) BY MOUTH DAILY BEFORE BREAKFAST   lisinopril (ZESTRIL) 10 MG tablet TAKE 1 TABLET BY MOUTH EVERY NIGHT AT BEDTIME FOR HIGH BLOOD PRESSURE   metFORMIN (GLUCOPHAGE) 1000 MG tablet TAKE 1 TABLET(1000 MG) BY MOUTH DAILY WITH SUPPER   omeprazole (PRILOSEC) 20 MG capsule TAKE 1 CAPSULE(20 MG) BY MOUTH DAILY   FEROSUL 325 (65 Fe) MG tablet TAKE 1 TABLET(325 MG) BY MOUTH DAILY WITH BREAKFAST (Patient not taking: Reported on 09/08/2019)   glucose blood (CONTOUR  NEXT TEST) test strip Check sugar once daily  DX E11.9 (Patient not taking: Reported on 07/27/2019)   HYDROcodone-acetaminophen (NORCO/VICODIN) 5-325 MG tablet Take 1 tablet by mouth every 6 (six) hours as needed for moderate pain. (Patient not taking: Reported on 01/28/2019)   tiotropium (SPIRIVA) 18 MCG inhalation capsule Place 18 mcg into inhaler and inhale daily. (Patient not taking: Reported on 09/08/2019)   [DISCONTINUED] polyethylene glycol-electrolytes (NULYTELY/GOLYTELY) 420 g solution TAKE 4000 MLS PO ONCE FOR 1 DOSE (Patient not taking: Reported on 09/08/2019)   No facility-administered medications prior to visit.    Review of Systems  Constitutional: Negative for chills, fatigue and unexpected weight change.  Respiratory: Positive for shortness of breath.   Cardiovascular: Positive for leg swelling. Negative for chest pain and palpitations.  Last metabolic panel Lab Results  Component Value Date   GLUCOSE 189 (H) 07/29/2019   NA 140 07/29/2019   K 4.9 07/29/2019   CL 103 07/29/2019   CO2 20 07/29/2019   BUN 13 07/29/2019   CREATININE 0.76 07/29/2019   GFRNONAA 73 07/29/2019   GFRAA 84 07/29/2019   CALCIUM 9.9 07/29/2019   PHOS 3.3 09/17/2014   PROT 6.9 07/29/2019   ALBUMIN 4.2 07/29/2019   LABGLOB 2.7 07/29/2019   AGRATIO 1.6 07/29/2019   BILITOT 0.2 07/29/2019   ALKPHOS 68 07/29/2019   AST 18 07/29/2019   ALT 12 07/29/2019   ANIONGAP 4 (L) 07/09/2014      Objective    BP 122/60 (BP Location: Right Arm, Patient Position: Sitting, Cuff Size: Normal)   Pulse 96   Temp (!) 97.3 F (36.3 C) (Temporal)   Resp 20   Wt 199 lb (90.3 kg)   SpO2 95%   BMI 34.16 kg/m  BP Readings from Last 3 Encounters:  09/08/19 122/60  07/27/19 (!) 153/87  04/28/19 103/66   Wt Readings from Last 3 Encounters:  09/08/19 199 lb (90.3 kg)  07/27/19 200 lb (90.7 kg)  04/28/19 199 lb (90.3 kg)     Physical Exam Constitutional:      General: She is not in acute distress.     Appearance: She is well-developed.  HENT:     Head: Normocephalic and atraumatic.     Right Ear: Hearing normal.     Left Ear: Hearing normal.     Nose: Nose normal.  Eyes:     General: Lids are normal. No scleral icterus.       Right eye: No discharge.        Left eye: No discharge.     Conjunctiva/sclera: Conjunctivae normal.  Cardiovascular:     Rate and Rhythm: Normal rate and regular rhythm.     Heart sounds: Normal heart sounds.  Pulmonary:     Effort: Pulmonary effort is normal. No respiratory distress.     Breath sounds: Normal breath sounds.  Abdominal:     General: Bowel sounds are normal.     Palpations: Abdomen is soft.  Musculoskeletal:        General: Swelling present.     Comments: Mild puffiness of ankles bilaterally. No pitting. Good posterior tibial pulses and normal capillary refill in all toes. Few small skin tears at each lower leg. Varicose veins in lower legs - L>R. Well healed scar left knee from joint replacement.  Skin:    Findings: No lesion or rash.  Neurological:     Mental Status: She is alert and oriented to person, place, and time.  Psychiatric:        Speech: Speech normal.        Behavior: Behavior normal.        Thought Content: Thought content normal.      No results found for any visits on 09/08/19.  Assessment & Plan     1. Mild ankle edema Mild edema of ankles. No known injury. Some evidence of varicose veins. No significant dyspnea today. Recommend support hose and recheck if worsening. Restrict salt intake.  2. Essential hypertension Well controlled. Continue present medications.   3. Anemia, unspecified type Iron saturation very low on 07-29-19 with iron level only 29. Recommended she get back on the iron supplement and use stool softener if any constipation develops. Drink plenty of fluids.   No follow-ups on file.  I, Brea Coleson, PA-C, have reviewed all documentation for this visit. The documentation on 12/08/20 for  the exam, diagnosis, procedures, and orders are all accurate and complete.    Vernie Murders, [place] [phone] (phone) [phone] (fax)  [place]

## 2019-09-08 ENCOUNTER — Ambulatory Visit (INDEPENDENT_AMBULATORY_CARE_PROVIDER_SITE_OTHER): Payer: Medicare Other | Admitting: Family Medicine

## 2019-09-08 ENCOUNTER — Other Ambulatory Visit: Payer: Self-pay

## 2019-09-08 ENCOUNTER — Encounter: Payer: Self-pay | Admitting: Dermatology

## 2019-09-08 ENCOUNTER — Encounter: Payer: Self-pay | Admitting: Family Medicine

## 2019-09-08 VITALS — BP 122/60 | HR 96 | Temp 97.3°F | Resp 20 | Wt 199.0 lb

## 2019-09-08 DIAGNOSIS — D649 Anemia, unspecified: Secondary | ICD-10-CM

## 2019-09-08 DIAGNOSIS — M25473 Effusion, unspecified ankle: Secondary | ICD-10-CM | POA: Diagnosis not present

## 2019-09-08 DIAGNOSIS — I10 Essential (primary) hypertension: Secondary | ICD-10-CM

## 2019-09-08 MED ORDER — LISINOPRIL 20 MG PO TABS: ORAL_TABLET | ORAL | 6 refills | Status: DC

## 2019-09-08 NOTE — Patient Instructions (Signed)
Check blood pressure each day at home to monitor medication changes.  Restrict salt use to reduce fluid retention.

## 2019-09-09 ENCOUNTER — Telehealth: Payer: Self-pay

## 2019-09-09 NOTE — Telephone Encounter (Signed)
Patient advised of biopsy results.

## 2019-09-09 NOTE — Telephone Encounter (Signed)
Left message on voicemail to return my call.  

## 2019-09-09 NOTE — Telephone Encounter (Signed)
-----   Message from Ralene Bathe, MD sent at 09/08/2019  6:58 PM EDT ----- 1. Skin , left calf VERRUCA VULGARIS, IRRITATED 2. Skin , left forearm WELL DIFFERENTIATED SQUAMOUS CELL CARCINOMA 3. Skin , right dorsal hand SQUAMOUS CELL CARCINOMA IN SITU  1- Viral wart  Already treated May recur 2&3 - Cancer - SCC Already treated Recheck next visit.

## 2019-09-16 ENCOUNTER — Other Ambulatory Visit: Payer: Self-pay | Admitting: Family Medicine

## 2019-09-23 DIAGNOSIS — M7582 Other shoulder lesions, left shoulder: Secondary | ICD-10-CM | POA: Diagnosis not present

## 2019-09-23 DIAGNOSIS — M19012 Primary osteoarthritis, left shoulder: Secondary | ICD-10-CM | POA: Diagnosis not present

## 2019-11-05 ENCOUNTER — Other Ambulatory Visit: Payer: Self-pay | Admitting: Family Medicine

## 2019-11-26 NOTE — Progress Notes (Signed)
Established patient visit   Patient: Kathryn [name]   DOB: [birth year]   86 y.o. Female  MRN: [id number] Visit Date: 12/01/2019  Today's healthcare provider: [name], MD   Chief Complaint  Patient presents with  . Depression  . Hyperlipidemia  . Hypertension   Subjective    HPI  Patient is now moved in with her son and daughter-in-law.  She feels weak in the legs more so now than in the past.  She has chronic fatigue and pain.  She also has developed nonproductive cough that is slowly gotten worse.  She stopped smoking years ago. Diabetes Mellitus Type II, follow-up  Lab Results  Component Value Date   HGBA1C 7.7 (A) 07/27/2019   HGBA1C 7.4 (H) 04/28/2019   HGBA1C 7.6 (H) 11/05/2018   Last seen for diabetes 4 months ago.  Management since then includes continuing the same treatment. She reports excellent compliance with treatment. She is not having side effects.   Home blood sugar records: are not being checked  Episodes of hypoglycemia? No   Current insulin regiment: NONE Most Recent Eye Exam:   --------------------------------------------------------------------------------------------------- Hypertension, follow-up  BP Readings from Last 3 Encounters:  12/01/19 (!) 150/75  09/08/19 122/60  07/27/19 (!) 153/87   Wt Readings from Last 3 Encounters:  12/01/19 184 lb 3.2 oz (83.6 kg)  09/08/19 199 lb (90.3 kg)  07/27/19 200 lb (90.7 kg)     She was last seen for hypertension 4 months ago.  BP at that visit was 153/87. Management since that visit includes; Fair control on amlodipine and lisinopril. Consider stopping amlodipine and increasing lisinopril dose in the future as she does have ankle and pedal edema that I think it is from the amlodipine the least partially. She reports excellent compliance with treatment. She is not having side effects.  She is not exercising. She is adherent to low salt diet.   Outside blood pressures are not being  checked.  She does not smoke.  Use of agents associated with hypertension: none.   --------------------------------------------------------------------------------------------------- Lipid/Cholesterol, follow-up  Last Lipid Panel: Lab Results  Component Value Date   CHOL 167 07/29/2019   LDLCALC 81 07/29/2019   HDL 59 07/29/2019   TRIG 160 (H) 07/29/2019    She was last seen for this 4 months ago.  Management since that visit includes; On atorvastatin 40. She reports excellent compliance with treatment. She is not having side effects.  She is following a Regular diet. Current exercise: none  Last metabolic panel Lab Results  Component Value Date   GLUCOSE 189 (H) 07/29/2019   NA 140 07/29/2019   K 4.9 07/29/2019   BUN 13 07/29/2019   CREATININE 0.76 07/29/2019   GFRNONAA 73 07/29/2019   GFRAA 84 07/29/2019   CALCIUM 9.9 07/29/2019   AST 18 07/29/2019   ALT 12 07/29/2019   The ASCVD Risk score (Goff DC Jr., et al., 2013) failed to calculate for the following reasons:   The 2013 ASCVD risk score is only valid for ages 63 to 37  ---------------------------------------------------------------------------------------------------  Anemia, unspecified type From 07/27/2019-labs checked showing-stable. Will repeat CBC and iron panel on her next visit to make sure her iron is staying up.  Osteoarthritis, unspecified osteoarthritis type, unspecified site From 07/27/2019-Try turmeric daily  COPD, mild (HCC) From 07/27/2019-Presently asymptomatic.  She stopped smoking years ago.    Social History   Tobacco Use  . Smoking status: Former Smoker    Packs/day: 0.50  Years: 50.00    Pack years: 25.00    Types: Cigarettes    Quit date: 05/16/2004    Years since quitting: 15.5  . Smokeless tobacco: Never Used  Substance Use Topics  . Alcohol use: Yes    Comment: socially  . Drug use: No       Medications: Outpatient Medications Prior to Visit  Medication Sig    . acetaminophen (TYLENOL) 500 MG tablet Take 1,000 mg by mouth every 6 (six) hours as needed. Pain   . albuterol (PROVENTIL) (2.5 MG/3ML) 0.083% nebulizer solution Take 3 mLs (2.5 mg total) by nebulization every 6 (six) hours as needed for wheezing or shortness of breath.  Marland Kitchen atorvastatin (LIPITOR) 40 MG tablet TAKE 1 TABLET BY MOUTH EVERY DAY  . cetirizine (ZYRTEC) 10 MG tablet Take 10 mg by mouth at bedtime.   . DULoxetine (CYMBALTA) 20 MG capsule TAKE 1 CAPSULE(20 MG) BY MOUTH DAILY  . esomeprazole (NEXIUM) 40 MG capsule Take 40 mg by mouth daily at 12 noon.  Marland Kitchen glucose blood (CONTOUR NEXT TEST) test strip Check sugar once daily  DX E11.9  . hydrOXYzine (ATARAX/VISTARIL) 10 MG tablet TAKE 1 TO 2 TABLETS(10 TO 20 MG) BY MOUTH EVERY 6 HOURS AS NEEDED  . levothyroxine (SYNTHROID) 100 MCG tablet TAKE 1 TABLET(100 MCG) BY MOUTH DAILY BEFORE BREAKFAST  . lisinopril (ZESTRIL) 20 MG tablet TAKE 1 TABLET BY MOUTH EVERY NIGHT AT BEDTIME FOR HIGH BLOOD PRESSURE  . metFORMIN (GLUCOPHAGE) 1000 MG tablet TAKE 1 TABLET(1000 MG) BY MOUTH DAILY WITH SUPPER  . omeprazole (PRILOSEC) 20 MG capsule TAKE 1 CAPSULE(20 MG) BY MOUTH DAILY  . FEROSUL 325 (65 Fe) MG tablet TAKE 1 TABLET(325 MG) BY MOUTH DAILY WITH BREAKFAST (Patient not taking: Reported on 09/08/2019)  . HYDROcodone-acetaminophen (NORCO/VICODIN) 5-325 MG tablet Take 1 tablet by mouth every 6 (six) hours as needed for moderate pain. (Patient not taking: Reported on 01/28/2019)  . tiotropium (SPIRIVA) 18 MCG inhalation capsule Place 18 mcg into inhaler and inhale daily. (Patient not taking: Reported on 09/08/2019)   No facility-administered medications prior to visit.    Review of Systems  Constitutional: Negative for appetite change, chills, fatigue and fever.  Respiratory: Negative for chest tightness and shortness of breath.   Cardiovascular: Negative for chest pain and palpitations.  Gastrointestinal: Negative for abdominal pain, nausea and vomiting.   Neurological: Negative for dizziness and weakness.    Last hemoglobin A1c Lab Results  Component Value Date   HGBA1C 7.9 (H) 12/01/2019      Objective    BP (!) 150/75 (BP Location: Right Arm, Patient Position: Sitting, Cuff Size: Normal)   Pulse 93   Temp 98.3 F (36.8 C) (Oral)   Ht 5\' 4"  (1.626 m)   Wt 184 lb 3.2 oz (83.6 kg)   BMI 31.62 kg/m  BP Readings from Last 3 Encounters:  12/01/19 (!) 150/75  09/08/19 122/60  07/27/19 (!) 153/87   Wt Readings from Last 3 Encounters:  12/01/19 184 lb 3.2 oz (83.6 kg)  09/08/19 199 lb (90.3 kg)  07/27/19 200 lb (90.7 kg)      Physical Exam Vitals and nursing note reviewed.  Constitutional:      Appearance: Normal appearance. She is normal weight.  HENT:     Right Ear: Tympanic membrane normal.     Left Ear: Tympanic membrane normal.     Nose: Nose normal.     Mouth/Throat:     Mouth: Mucous membranes are moist.  Pharynx: Oropharynx is clear.  Eyes:     General: No scleral icterus.    Conjunctiva/sclera: Conjunctivae normal.  Cardiovascular:     Rate and Rhythm: Normal rate and regular rhythm.     Pulses: Normal pulses.     Heart sounds: Normal heart sounds.  Pulmonary:     Effort: Pulmonary effort is normal.     Breath sounds: Normal breath sounds.  Abdominal:     General: Bowel sounds are normal.     Palpations: Abdomen is soft.  Musculoskeletal:        General: Normal range of motion.     Cervical back: Normal range of motion and neck supple.     Right lower leg: Edema present.     Left lower leg: Edema present.     Comments: 1+ fecal edema in ankles  Skin:    General: Skin is warm and dry.  Neurological:     Mental Status: She is alert.  Psychiatric:        Mood and Affect: Mood normal.        Behavior: Behavior normal.        Thought Content: Thought content normal.        Judgment: Judgment normal.       No results found for any visits on 12/01/19.  Assessment & Plan     1. Type 2  diabetes mellitus without complication, without long-term current use of insulin ([place]) She does have neuropathy.  Last A1c was 7.9 - HgB A1c  2. Essential hypertension At this time stop lisinopril because of the cough.  I will see her back in 1 month for this. - CBC with Differential/Platelet - Flu Vaccine QUAD High Dose(Fluad)  3. Hypercholesteremia At this time stop atorvastatin because of the weakness in the legs.  Again see her back in 1 month. - CBC with Differential/Platelet  4. Anemia, unspecified type Follow-up.  If this is stable we can stop iron and stay off of it - CBC with Differential/Platelet - Fe+TIBC+Fer - Flu Vaccine QUAD High Dose(Fluad)  5. Weakness of both lower extremities Stop atorvastatin and consider physical therapy for patient with OA and DDD  6. Cough Stop lisinopril, consider chest x-ray on next visit  7. Flu vaccine need  - Flu Vaccine QUAD High Dose(Fluad)   No follow-ups on file.      I, [name], MD, have reviewed all documentation for this visit. The documentation on 12/11/19 for the exam, diagnosis, procedures, and orders are all accurate and complete.    Cartina Brousseau Cranford Mon, MD  [facility] [phone] (phone) [phone] (fax)  [place]

## 2019-12-01 ENCOUNTER — Other Ambulatory Visit: Payer: Self-pay

## 2019-12-01 ENCOUNTER — Ambulatory Visit (INDEPENDENT_AMBULATORY_CARE_PROVIDER_SITE_OTHER): Payer: Medicare Other | Admitting: Family Medicine

## 2019-12-01 ENCOUNTER — Encounter: Payer: Self-pay | Admitting: Family Medicine

## 2019-12-01 VITALS — BP 150/75 | HR 93 | Temp 98.3°F | Ht 64.0 in | Wt 184.2 lb

## 2019-12-01 DIAGNOSIS — D649 Anemia, unspecified: Secondary | ICD-10-CM

## 2019-12-01 DIAGNOSIS — R05 Cough: Secondary | ICD-10-CM

## 2019-12-01 DIAGNOSIS — E7800 Pure hypercholesterolemia, unspecified: Secondary | ICD-10-CM | POA: Diagnosis not present

## 2019-12-01 DIAGNOSIS — E119 Type 2 diabetes mellitus without complications: Secondary | ICD-10-CM | POA: Diagnosis not present

## 2019-12-01 DIAGNOSIS — I10 Essential (primary) hypertension: Secondary | ICD-10-CM | POA: Diagnosis not present

## 2019-12-01 DIAGNOSIS — Z23 Encounter for immunization: Secondary | ICD-10-CM | POA: Diagnosis not present

## 2019-12-01 DIAGNOSIS — R29898 Other symptoms and signs involving the musculoskeletal system: Secondary | ICD-10-CM | POA: Diagnosis not present

## 2019-12-01 DIAGNOSIS — R059 Cough, unspecified: Secondary | ICD-10-CM

## 2019-12-01 NOTE — Patient Instructions (Signed)
FOR COUGH- STOP LISINOPRIL!!!   FOR WEAKNESS IN LEGS- STOP ATORVASTATIN FOR NOW!!!

## 2019-12-02 LAB — CBC WITH DIFFERENTIAL/PLATELET
Basophils Absolute: 0.1 10*3/uL (ref 0.0–0.2)
Basos: 1 %
EOS (ABSOLUTE): 0.2 10*3/uL (ref 0.0–0.4)
Eos: 2 %
Hematocrit: 34.7 % (ref 34.0–46.6)
Hemoglobin: 11.2 g/dL (ref 11.1–15.9)
Immature Grans (Abs): 0.1 10*3/uL (ref 0.0–0.1)
Immature Granulocytes: 1 %
Lymphocytes Absolute: 2.3 10*3/uL (ref 0.7–3.1)
Lymphs: 29 %
MCH: 26 pg — ABNORMAL LOW (ref 26.6–33.0)
MCHC: 32.3 g/dL (ref 31.5–35.7)
MCV: 81 fL (ref 79–97)
Monocytes Absolute: 0.7 10*3/uL (ref 0.1–0.9)
Monocytes: 8 %
Neutrophils Absolute: 4.8 10*3/uL (ref 1.4–7.0)
Neutrophils: 59 %
Platelets: 413 10*3/uL (ref 150–450)
RBC: 4.3 x10E6/uL (ref 3.77–5.28)
RDW: 16.3 % — ABNORMAL HIGH (ref 11.7–15.4)
WBC: 8.1 10*3/uL (ref 3.4–10.8)

## 2019-12-02 LAB — IRON,TIBC AND FERRITIN PANEL
Ferritin: 25 ng/mL (ref 15–150)
Iron Saturation: 6 % — CL (ref 15–55)
Iron: 24 ug/dL — ABNORMAL LOW (ref 27–139)
Total Iron Binding Capacity: 394 ug/dL (ref 250–450)
UIBC: 370 ug/dL — ABNORMAL HIGH (ref 118–369)

## 2019-12-02 LAB — HEMOGLOBIN A1C
Est. average glucose Bld gHb Est-mCnc: 180 mg/dL
Hgb A1c MFr Bld: 7.9 % — ABNORMAL HIGH (ref 4.8–5.6)

## 2019-12-03 ENCOUNTER — Telehealth: Payer: Self-pay

## 2019-12-03 NOTE — Telephone Encounter (Signed)
Patient called, left VM to return the call to the office for lab results.  ?

## 2019-12-03 NOTE — Telephone Encounter (Signed)
-----   Message from Jerrol Banana., MD sent at 12/02/2019  3:02 PM EDT ----- Iron slightly low but blood count is stable so no treatment at this time.

## 2019-12-03 NOTE — Telephone Encounter (Signed)
Called to advised patient of lab results, LVMTCB. If patient calls back it is okay for PEC to advise.

## 2019-12-07 NOTE — Telephone Encounter (Signed)
Patient returned call- notified of lab results- she is a little disappointment her A1C is 7.9- she has lost weight and is working on diet. She wants to know if there is anything else PCP would like her to be doing at this time- she does have follow up in October.

## 2019-12-25 DIAGNOSIS — M19012 Primary osteoarthritis, left shoulder: Secondary | ICD-10-CM | POA: Diagnosis not present

## 2019-12-25 DIAGNOSIS — M7582 Other shoulder lesions, left shoulder: Secondary | ICD-10-CM | POA: Diagnosis not present

## 2019-12-29 ENCOUNTER — Ambulatory Visit (INDEPENDENT_AMBULATORY_CARE_PROVIDER_SITE_OTHER): Payer: Medicare Other | Admitting: Family Medicine

## 2019-12-29 ENCOUNTER — Other Ambulatory Visit: Payer: Self-pay

## 2019-12-29 ENCOUNTER — Encounter: Payer: Self-pay | Admitting: Family Medicine

## 2019-12-29 VITALS — BP 143/78 | HR 96 | Temp 98.5°F | Wt 180.0 lb

## 2019-12-29 DIAGNOSIS — M1990 Unspecified osteoarthritis, unspecified site: Secondary | ICD-10-CM | POA: Diagnosis not present

## 2019-12-29 DIAGNOSIS — M545 Low back pain, unspecified: Secondary | ICD-10-CM

## 2019-12-29 DIAGNOSIS — M5116 Intervertebral disc disorders with radiculopathy, lumbar region: Secondary | ICD-10-CM | POA: Diagnosis not present

## 2019-12-29 DIAGNOSIS — J449 Chronic obstructive pulmonary disease, unspecified: Secondary | ICD-10-CM | POA: Diagnosis not present

## 2019-12-29 DIAGNOSIS — N3090 Cystitis, unspecified without hematuria: Secondary | ICD-10-CM

## 2019-12-29 DIAGNOSIS — E1140 Type 2 diabetes mellitus with diabetic neuropathy, unspecified: Secondary | ICD-10-CM | POA: Diagnosis not present

## 2019-12-29 DIAGNOSIS — I10 Essential (primary) hypertension: Secondary | ICD-10-CM

## 2019-12-29 DIAGNOSIS — G8929 Other chronic pain: Secondary | ICD-10-CM

## 2019-12-29 LAB — POCT URINALYSIS DIPSTICK
Bilirubin, UA: NEGATIVE
Glucose, UA: NEGATIVE
Ketones, UA: NEGATIVE
Nitrite, UA: POSITIVE
Protein, UA: POSITIVE — AB
Spec Grav, UA: <=1.005 — AB (ref 1.010–1.025)
Urobilinogen, UA: 0.2 E.U./dL
pH, UA: 5 (ref 5.0–8.0)

## 2019-12-29 MED ORDER — NITROFURANTOIN MONOHYD MACRO 100 MG PO CAPS
100.0000 mg | ORAL_CAPSULE | Freq: Two times a day (BID) | ORAL | 1 refills | Status: DC
Start: 2019-12-29 — End: 2020-01-19

## 2019-12-29 NOTE — Progress Notes (Signed)
Established patient visit   Patient: Kathryn [name]   DOB: [birth year]   86 y.o. Female  MRN: [id number] Visit Date: 12/29/2019  Today's healthcare provider: [name], MD   Chief Complaint  Patient presents with  . Urinary Tract Infection   Subjective    Urinary Tract Infection  This is a new problem. The current episode started in the past 7 days. The problem has been gradually worsening. There has been no fever. Associated symptoms include frequency and urgency. Pertinent negatives include no flank pain, hematuria, hesitancy or vomiting. She has tried increased fluids for the symptoms. The treatment provided no relief.  No back pain, no fevers, no hematuria.  Her symptoms have not been relieved by cranberry juice. HPI    Urinary Tract Infection    This is a new problem.  Recent episode started in the past 7 days.  The problem has been gradually worsening since onset.  The patient  has not been recently treated for similar symptoms.  Abdominal Pain: Absent.  Back Pain: Absent.  Chills: Absent.  Cloudy malodorus urine: Absent.  Constipation: Absent.  Cramping: Absent.  Diarrhea: Absent.  Discharge: Absent.  Fever: Absent.  Hematuria: Absent.  Nausea: Absent.  Vomiting: Absent.       Last edited by Kizzie Furnish, CMA on 12/29/2019  3:52 PM. (History)           Medications: Outpatient Medications Prior to Visit  Medication Sig  . acetaminophen (TYLENOL) 500 MG tablet Take 1,000 mg by mouth every 6 (six) hours as needed. Pain   . albuterol (PROVENTIL) (2.5 MG/3ML) 0.083% nebulizer solution Take 3 mLs (2.5 mg total) by nebulization every 6 (six) hours as needed for wheezing or shortness of breath.  Marland Kitchen atorvastatin (LIPITOR) 40 MG tablet TAKE 1 TABLET BY MOUTH EVERY DAY  . cetirizine (ZYRTEC) 10 MG tablet Take 10 mg by mouth at bedtime.   . DULoxetine (CYMBALTA) 20 MG capsule TAKE 1 CAPSULE(20 MG) BY MOUTH DAILY  . esomeprazole (NEXIUM) 40 MG capsule Take 40 mg by  mouth daily at 12 noon.  Marland Kitchen FEROSUL 325 (65 Fe) MG tablet TAKE 1 TABLET(325 MG) BY MOUTH DAILY WITH BREAKFAST  . glucose blood (CONTOUR NEXT TEST) test strip Check sugar once daily  DX E11.9  . HYDROcodone-acetaminophen (NORCO/VICODIN) 5-325 MG tablet Take 1 tablet by mouth every 6 (six) hours as needed for moderate pain.  . hydrOXYzine (ATARAX/VISTARIL) 10 MG tablet TAKE 1 TO 2 TABLETS(10 TO 20 MG) BY MOUTH EVERY 6 HOURS AS NEEDED  . levothyroxine (SYNTHROID) 100 MCG tablet TAKE 1 TABLET(100 MCG) BY MOUTH DAILY BEFORE BREAKFAST  . lisinopril (ZESTRIL) 20 MG tablet TAKE 1 TABLET BY MOUTH EVERY NIGHT AT BEDTIME FOR HIGH BLOOD PRESSURE  . metFORMIN (GLUCOPHAGE) 1000 MG tablet TAKE 1 TABLET(1000 MG) BY MOUTH DAILY WITH SUPPER  . omeprazole (PRILOSEC) 20 MG capsule TAKE 1 CAPSULE(20 MG) BY MOUTH DAILY  . tiotropium (SPIRIVA) 18 MCG inhalation capsule Place 18 mcg into inhaler and inhale daily.    No facility-administered medications prior to visit.    Review of Systems  Constitutional: Negative.   Respiratory: Negative.   Cardiovascular: Negative.   Gastrointestinal: Negative.  Negative for vomiting.  Genitourinary: Positive for dysuria, frequency and urgency. Negative for decreased urine volume, difficulty urinating, dyspareunia, enuresis, flank pain, genital sores, hematuria, hesitancy, menstrual problem, pelvic pain, vaginal bleeding, vaginal discharge and vaginal pain.  Neurological: Negative for dizziness, light-headedness and headaches.  Objective    BP (!) 143/78 (BP Location: Left Arm, Patient Position: Sitting, Cuff Size: Large)   Pulse 96   Temp 98.5 F (36.9 C) (Oral)   Wt 180 lb (81.6 kg)   BMI 30.90 kg/m    Physical Exam Vitals and nursing note reviewed.  Constitutional:      Appearance: Normal appearance. She is normal weight.  HENT:     Right Ear: Tympanic membrane normal.     Left Ear: Tympanic membrane normal.     Nose: Nose normal.     Mouth/Throat:      Mouth: Mucous membranes are moist.     Pharynx: Oropharynx is clear.  Eyes:     General: No scleral icterus.    Conjunctiva/sclera: Conjunctivae normal.  Cardiovascular:     Rate and Rhythm: Normal rate and regular rhythm.     Pulses: Normal pulses.     Heart sounds: Normal heart sounds.  Pulmonary:     Effort: Pulmonary effort is normal.     Breath sounds: Normal breath sounds.  Abdominal:     Palpations: Abdomen is soft.     Comments: No CVA tenderness or suprapubic tenderness  Musculoskeletal:        General: Normal range of motion.     Cervical back: Normal range of motion and neck supple.     Right lower leg: Edema present.     Left lower leg: Edema present.     Comments: 1+ pedal edema in ankles  Skin:    General: Skin is warm and dry.  Neurological:     Mental Status: She is alert.  Psychiatric:        Mood and Affect: Mood normal.        Behavior: Behavior normal.        Thought Content: Thought content normal.        Judgment: Judgment normal.       No results found for any visits on 12/29/19.  Assessment & Plan     1. Cystitis Treated with nitrofurantoin pending culture - POCT urinalysis dipstick - CULTURE, URINE COMPREHENSIVE  2. Essential hypertension   3. Lumbar disc disease with radiculopathy   4. Type 2 diabetes mellitus with diabetic neuropathy, without long-term current use of insulin ([place])   5. Osteoarthritis, unspecified osteoarthritis type, unspecified site   6. Chronic right-sided low back pain without sciatica   7. COPD, mild ([place])    No follow-ups on file.      I, [name], MD, have reviewed all documentation for this visit. The documentation on 12/30/19 for the exam, diagnosis, procedures, and orders are all accurate and complete.    Shavell Nored Cranford Mon, MD  [facility] [phone] (phone) [phone] (fax)  [place]

## 2019-12-30 DIAGNOSIS — N3090 Cystitis, unspecified without hematuria: Secondary | ICD-10-CM | POA: Diagnosis not present

## 2020-01-01 LAB — CULTURE, URINE COMPREHENSIVE

## 2020-01-04 ENCOUNTER — Ambulatory Visit: Payer: Self-pay | Admitting: Family Medicine

## 2020-01-14 ENCOUNTER — Encounter: Payer: Self-pay | Admitting: Family Medicine

## 2020-01-14 ENCOUNTER — Ambulatory Visit (INDEPENDENT_AMBULATORY_CARE_PROVIDER_SITE_OTHER): Payer: Medicare Other | Admitting: Family Medicine

## 2020-01-14 ENCOUNTER — Other Ambulatory Visit: Payer: Self-pay

## 2020-01-14 VITALS — BP 147/66 | HR 89 | Temp 98.3°F | Resp 18 | Ht 64.0 in | Wt 179.0 lb

## 2020-01-14 DIAGNOSIS — R634 Abnormal weight loss: Secondary | ICD-10-CM | POA: Diagnosis not present

## 2020-01-14 DIAGNOSIS — J449 Chronic obstructive pulmonary disease, unspecified: Secondary | ICD-10-CM

## 2020-01-14 DIAGNOSIS — E119 Type 2 diabetes mellitus without complications: Secondary | ICD-10-CM | POA: Diagnosis not present

## 2020-01-14 DIAGNOSIS — I10 Essential (primary) hypertension: Secondary | ICD-10-CM

## 2020-01-14 DIAGNOSIS — M5116 Intervertebral disc disorders with radiculopathy, lumbar region: Secondary | ICD-10-CM | POA: Diagnosis not present

## 2020-01-14 DIAGNOSIS — M4726 Other spondylosis with radiculopathy, lumbar region: Secondary | ICD-10-CM

## 2020-01-14 NOTE — Progress Notes (Signed)
I,[name],acting as a scribe for [name], MD.,have documented all relevant documentation on the behalf of [name], MD,as directed by  [name], MD while in the presence of [name], MD.   Established patient visit   Patient: Kathryn [name]   DOB: 1933-04-09   86 y.o. Female  MRN: [id number] Visit Date: 01/14/2020  Today's healthcare provider: [name], MD   Chief Complaint  Patient presents with   Anemia   Follow-up   Hyperlipidemia   Hypertension   Subjective    HPI  Overall patient feels very well.  With smoking 10 years ago.  She has lost 17 pounds in the past year but does not have much of an appetite in recent years.  No other significant plane other than arthritic and back pain. Hypertension, follow-up  BP Readings from Last 3 Encounters:  01/14/20 (!) 147/66  12/29/19 (!) 143/78  12/01/19 (!) 150/75   Wt Readings from Last 3 Encounters:  01/14/20 179 lb (81.2 kg)  12/29/19 180 lb (81.6 kg)  12/01/19 184 lb 3.2 oz (83.6 kg)     She was last seen for hypertension 1 months ago.  BP at that visit was 150/75. Management since that visit includes; At this time stop lisinopril because of the cough.  I will see her back in 1 month for this. She reports good compliance with treatment. She is not having side effects. none She is not exercising. She is adherent to low salt diet.   Outside blood pressures are notmal.  She does not smoke.  Use of agents associated with hypertension: none.   --------------------------------------------------------------------  Lipid/Cholesterol, follow-up  Last Lipid Panel: Lab Results  Component Value Date   CHOL 167 07/29/2019   LDLCALC 81 07/29/2019   HDL 59 07/29/2019   TRIG 160 (H) 07/29/2019    She was last seen for this 1 months ago.  Management since that visit includes; At this time stop atorvastatin because of the weakness in the legs.  Again see her back  in 1 month. She reports good compliance with treatment. She is not having side effects. none She is following a Regular, Low Sodium diet. Current exercise: none  Last metabolic panel Lab Results  Component Value Date   GLUCOSE 189 (H) 07/29/2019   NA 140 07/29/2019   K 4.9 07/29/2019   BUN 13 07/29/2019   CREATININE 0.76 07/29/2019   GFRNONAA 73 07/29/2019   GFRAA 84 07/29/2019   CALCIUM 9.9 07/29/2019   AST 18 07/29/2019   ALT 12 07/29/2019   The ASCVD Risk score (Goff DC Jr., et al., 2013) failed to calculate for the following reasons:   The 2013 ASCVD risk score is only valid for ages 26 to 57  --------------------------------------------------------------------  Anemia, unspecified type From 12/01/2019-Follow-up.  If this is stable we can stop iron and stay off of it  Weakness of both lower extremities From 12/01/2019-Stopped atorvastatin and consider physical therapy for patient with OA and DDD. Labs showed-Iron slightly low but blood count is stable so no treatment at this time.      Medications: Outpatient Medications Prior to Visit  Medication Sig   acetaminophen (TYLENOL) 500 MG tablet Take 1,000 mg by mouth every 6 (six) hours as needed. Pain    albuterol (PROVENTIL) (2.5 MG/3ML) 0.083% nebulizer solution Take 3 mLs (2.5 mg total) by nebulization every 6 (six) hours as needed for wheezing or shortness of breath.   atorvastatin (  LIPITOR) 40 MG tablet TAKE 1 TABLET BY MOUTH EVERY DAY (Patient not taking: Reported on 01/14/2020)   cetirizine (ZYRTEC) 10 MG tablet Take 10 mg by mouth at bedtime.    DULoxetine (CYMBALTA) 20 MG capsule TAKE 1 CAPSULE(20 MG) BY MOUTH DAILY   esomeprazole (NEXIUM) 40 MG capsule Take 40 mg by mouth daily at 12 noon.   FEROSUL 325 (65 Fe) MG tablet TAKE 1 TABLET(325 MG) BY MOUTH DAILY WITH BREAKFAST   glucose blood (CONTOUR NEXT TEST) test strip Check sugar once daily  DX E11.9   HYDROcodone-acetaminophen (NORCO/VICODIN) 5-325 MG  tablet Take 1 tablet by mouth every 6 (six) hours as needed for moderate pain.   hydrOXYzine (ATARAX/VISTARIL) 10 MG tablet TAKE 1 TO 2 TABLETS(10 TO 20 MG) BY MOUTH EVERY 6 HOURS AS NEEDED   levothyroxine (SYNTHROID) 100 MCG tablet TAKE 1 TABLET(100 MCG) BY MOUTH DAILY BEFORE BREAKFAST   lisinopril (ZESTRIL) 20 MG tablet TAKE 1 TABLET BY MOUTH EVERY NIGHT AT BEDTIME FOR HIGH BLOOD PRESSURE (Patient not taking: Reported on 01/14/2020)   metFORMIN (GLUCOPHAGE) 1000 MG tablet TAKE 1 TABLET(1000 MG) BY MOUTH DAILY WITH SUPPER   nitrofurantoin, macrocrystal-monohydrate, (MACROBID) 100 MG capsule Take 1 capsule (100 mg total) by mouth 2 (two) times daily.   omeprazole (PRILOSEC) 20 MG capsule TAKE 1 CAPSULE(20 MG) BY MOUTH DAILY   tiotropium (SPIRIVA) 18 MCG inhalation capsule Place 18 mcg into inhaler and inhale daily.    No facility-administered medications prior to visit.    Review of Systems  Constitutional: Negative for appetite change, chills, fatigue and fever.  Respiratory: Negative for chest tightness and shortness of breath.   Cardiovascular: Negative for chest pain and palpitations.  Gastrointestinal: Negative for abdominal pain, nausea and vomiting.  Neurological: Negative for dizziness and weakness.       Objective    BP (!) 147/66 (BP Location: Right Arm, Patient Position: Sitting, Cuff Size: Large)    Pulse 89    Temp 98.3 F (36.8 C) (Oral)    Resp 18    Ht 5\' 4"  (1.626 m)    Wt 179 lb (81.2 kg)    SpO2 97%    BMI 30.73 kg/m  BP Readings from Last 3 Encounters:  01/14/20 (!) 147/66  12/29/19 (!) 143/78  12/01/19 (!) 150/75   Wt Readings from Last 3 Encounters:  01/14/20 179 lb (81.2 kg)  12/29/19 180 lb (81.6 kg)  12/01/19 184 lb 3.2 oz (83.6 kg)      Physical Exam Vitals and nursing note reviewed.  Constitutional:      Appearance: Normal appearance. She is normal weight.  HENT:     Right Ear: Tympanic membrane normal.     Left Ear: Tympanic membrane  normal.     Nose: Nose normal.     Mouth/Throat:     Mouth: Mucous membranes are moist.     Pharynx: Oropharynx is clear.  Eyes:     General: No scleral icterus.    Conjunctiva/sclera: Conjunctivae normal.  Cardiovascular:     Rate and Rhythm: Normal rate and regular rhythm.     Pulses: Normal pulses.     Heart sounds: Normal heart sounds.  Pulmonary:     Effort: Pulmonary effort is normal.     Breath sounds: Normal breath sounds.  Abdominal:     General: Bowel sounds are normal.     Palpations: Abdomen is soft.  Musculoskeletal:        General: Normal range of motion.  Cervical back: Normal range of motion and neck supple.     Right lower leg: Edema present.     Left lower leg: Edema present.     Comments: 1+ fecal edema in ankles  Skin:    General: Skin is warm and dry.  Neurological:     Mental Status: She is alert.  Psychiatric:        Mood and Affect: Mood normal.        Behavior: Behavior normal.        Thought Content: Thought content normal.        Judgment: Judgment normal.       No results found for any visits on 01/14/20.  Assessment & Plan     1. Essential hypertension Good control  2. COPD, mild ([place]) Improved patient quit smoking.  3. Type 2 diabetes mellitus without complication, without long-term current use of insulin (HCC) Follow-up A1c when appropriate  4. Lumbar disc disease with radiculopathy Patient with chronic pain  5. Osteoarthritis of spine with radiculopathy, lumbar region   6. Weight loss, non-intentional Will follow.  We both agree that no work-up indicated at this time but she feels fairly well.   No follow-ups on file.         Benjamim Harnish Cranford Mon, MD  [facility] [phone] (phone) [phone] (fax)  [place]

## 2020-01-19 ENCOUNTER — Other Ambulatory Visit: Payer: Self-pay

## 2020-01-19 ENCOUNTER — Ambulatory Visit (INDEPENDENT_AMBULATORY_CARE_PROVIDER_SITE_OTHER): Payer: Medicare Other | Admitting: Family Medicine

## 2020-01-19 ENCOUNTER — Encounter: Payer: Self-pay | Admitting: Family Medicine

## 2020-01-19 VITALS — BP 148/59 | HR 89 | Temp 98.4°F | Resp 18 | Wt 179.0 lb

## 2020-01-19 DIAGNOSIS — N3090 Cystitis, unspecified without hematuria: Secondary | ICD-10-CM

## 2020-01-19 LAB — POCT URINALYSIS DIPSTICK
Appearance: ABNORMAL
Bilirubin, UA: NEGATIVE
Glucose, UA: NEGATIVE
Nitrite, UA: NEGATIVE
Odor: ABNORMAL
Protein, UA: POSITIVE — AB
Spec Grav, UA: 1.02 (ref 1.010–1.025)
Urobilinogen, UA: 0.2 E.U./dL
pH, UA: 6 (ref 5.0–8.0)

## 2020-01-19 MED ORDER — NITROFURANTOIN MONOHYD MACRO 100 MG PO CAPS: 100.0000 mg | ORAL_CAPSULE | Freq: Two times a day (BID) | ORAL | 1 refills | Status: DC

## 2020-01-19 NOTE — Progress Notes (Signed)
I,[name],acting as a scribe for [name], MD.,have documented all relevant documentation on the behalf of [name], MD,as directed by  [name], MD while in the presence of [name], MD.   Established patient visit   Patient: Kathryn [name]   DOB: 01-17-1934   86 y.o. Female  MRN: [id number] Visit Date: 01/19/2020  Today's healthcare provider: [name], MD   Chief Complaint  Patient presents with  . Dysuria   Subjective    HPI  Cystitis From 12/29/2019-Treated with nitrofurantoin. Patient completed antibiotic last week. Patient states she began having burning upon urination yesterday.  No back pain or fevers.No   Abdominal pain She did well with last infection with nitrofurantoin.      Medications: Outpatient Medications Prior to Visit  Medication Sig  . acetaminophen (TYLENOL) 500 MG tablet Take 1,000 mg by mouth every 6 (six) hours as needed. Pain   . albuterol (PROVENTIL) (2.5 MG/3ML) 0.083% nebulizer solution Take 3 mLs (2.5 mg total) by nebulization every 6 (six) hours as needed for wheezing or shortness of breath.  . DULoxetine (CYMBALTA) 20 MG capsule TAKE 1 CAPSULE(20 MG) BY MOUTH DAILY  . FEROSUL 325 (65 Fe) MG tablet TAKE 1 TABLET(325 MG) BY MOUTH DAILY WITH BREAKFAST  . glucose blood (CONTOUR NEXT TEST) test strip Check sugar once daily  DX E11.9  . hydrOXYzine (ATARAX/VISTARIL) 10 MG tablet TAKE 1 TO 2 TABLETS(10 TO 20 MG) BY MOUTH EVERY 6 HOURS AS NEEDED  . levothyroxine (SYNTHROID) 100 MCG tablet TAKE 1 TABLET(100 MCG) BY MOUTH DAILY BEFORE BREAKFAST  . metFORMIN (GLUCOPHAGE) 1000 MG tablet TAKE 1 TABLET(1000 MG) BY MOUTH DAILY WITH SUPPER  . omeprazole (PRILOSEC) 20 MG capsule TAKE 1 CAPSULE(20 MG) BY MOUTH DAILY  . tiotropium (SPIRIVA) 18 MCG inhalation capsule Place 18 mcg into inhaler and inhale daily.   Marland Kitchen atorvastatin (LIPITOR) 40 MG tablet TAKE 1 TABLET BY MOUTH EVERY DAY (Patient not taking:  Reported on 01/14/2020)  . cetirizine (ZYRTEC) 10 MG tablet Take 10 mg by mouth at bedtime.  (Patient not taking: Reported on 01/19/2020)  . esomeprazole (NEXIUM) 40 MG capsule Take 40 mg by mouth daily at 12 noon. (Patient not taking: Reported on 01/19/2020)  . HYDROcodone-acetaminophen (NORCO/VICODIN) 5-325 MG tablet Take 1 tablet by mouth every 6 (six) hours as needed for moderate pain. (Patient not taking: Reported on 01/19/2020)  . lisinopril (ZESTRIL) 20 MG tablet TAKE 1 TABLET BY MOUTH EVERY NIGHT AT BEDTIME FOR HIGH BLOOD PRESSURE (Patient not taking: Reported on 01/14/2020)  . nitrofurantoin, macrocrystal-monohydrate, (MACROBID) 100 MG capsule Take 1 capsule (100 mg total) by mouth 2 (two) times daily. (Patient not taking: Reported on 01/19/2020)   No facility-administered medications prior to visit.    Review of Systems  Constitutional: Negative for appetite change, fatigue and fever.  Respiratory: Negative for chest tightness and shortness of breath.   Cardiovascular: Negative for chest pain and palpitations.  Gastrointestinal: Negative for abdominal pain.  Neurological: Negative for dizziness and weakness.       Objective    BP (!) 148/59 (BP Location: Right Arm, Patient Position: Sitting, Cuff Size: Large)   Pulse 89   Temp 98.4 F (36.9 C) (Oral)   Resp 18   Wt 179 lb (81.2 kg)   SpO2 97%   BMI 30.73 kg/m  BP Readings from Last 3 Encounters:  01/19/20 (!) 148/59  01/14/20 (!) 147/66  12/29/19 (!) 143/78   Wt  Readings from Last 3 Encounters:  01/19/20 179 lb (81.2 kg)  01/14/20 179 lb (81.2 kg)  12/29/19 180 lb (81.6 kg)      Physical Exam Vitals reviewed.  Constitutional:      Appearance: Normal appearance.  HENT:     Head: Normocephalic and atraumatic.  Cardiovascular:     Rate and Rhythm: Normal rate and regular rhythm.     Heart sounds: Normal heart sounds.  Pulmonary:     Effort: Pulmonary effort is normal.     Breath sounds: Normal breath  sounds.  Abdominal:     Palpations: Abdomen is soft.     Comments: No CVAT  Skin:    General: Skin is warm and dry.  Neurological:     General: No focal deficit present.     Mental Status: She is alert and oriented to person, place, and time.  Psychiatric:        Mood and Affect: Mood normal.        Behavior: Behavior normal.        Thought Content: Thought content normal.        Judgment: Judgment normal.       No results found for any visits on 01/19/20.  Assessment & Plan     1. Cystitis Nitrofurantoin, push fluids and cranberry juice.  Follow-up as needed. - POCT urinalysis dipstick - CULTURE, URINE COMPREHENSIVE - nitrofurantoin, macrocrystal-monohydrate, (MACROBID) 100 MG capsule; Take 1 capsule (100 mg total) by mouth 2 (two) times daily.  Dispense: 14 capsule; Refill: 1    No follow-ups on file.         Lailynn Southgate Cranford Mon, MD  [facility] [phone] (phone) [phone] (fax)  [place]

## 2020-01-22 LAB — CULTURE, URINE COMPREHENSIVE

## 2020-02-03 ENCOUNTER — Other Ambulatory Visit: Payer: Self-pay | Admitting: Family Medicine

## 2020-02-03 DIAGNOSIS — E119 Type 2 diabetes mellitus without complications: Secondary | ICD-10-CM

## 2020-02-03 NOTE — Telephone Encounter (Signed)
Future visit in 1 month  

## 2020-03-03 ENCOUNTER — Other Ambulatory Visit: Payer: Self-pay | Admitting: Family Medicine

## 2020-03-03 DIAGNOSIS — E119 Type 2 diabetes mellitus without complications: Secondary | ICD-10-CM

## 2020-03-07 ENCOUNTER — Ambulatory Visit: Payer: Medicare Other | Admitting: Dermatology

## 2020-03-22 NOTE — Progress Notes (Deleted)
Established patient visit   Patient: Kathryn [name]   DOB: Dec 25, 1933   86 y.o. Female  MRN: [id number] Visit Date: 03/28/2020  Today's healthcare provider: Megan Mans, MD   No chief complaint on file.  Subjective    HPI  Diabetes Mellitus Type II, follow-up  Lab Results  Component Value Date   HGBA1C 7.9 (H) 12/01/2019   HGBA1C 7.7 (A) 07/27/2019   HGBA1C 7.4 (H) 04/28/2019   Last seen for diabetes 3 months ago.  Management since then includes continuing the same treatment. She reports {excellent/good/fair/poor:19665} compliance with treatment. She {is/is not:21021397} having side effects. {document side effects if present:1}  Home blood sugar records: {diabetes glucometry results:16657}  Episodes of hypoglycemia? {Yes/No:20286} {enter details if yes:1}   Current insulin regiment: {***Type 'None' if not taking insulin                                                otherwise enter complete                                                 details of insulin regiment:1} Most Recent Eye Exam: ***  --------------------------------------------------------------------------------------------------- Hypertension, follow-up  BP Readings from Last 3 Encounters:  01/19/20 (!) 148/59  01/14/20 (!) 147/66  12/29/19 (!) 143/78   Wt Readings from Last 3 Encounters:  01/19/20 179 lb (81.2 kg)  01/14/20 179 lb (81.2 kg)  12/29/19 180 lb (81.6 kg)     She was last seen for hypertension 2 months ago.  BP at that visit was 147/66. Management since that visit includes; Good control. She reports {excellent/good/fair/poor:19665} compliance with treatment. She {is/is not:9024} having side effects. {document side effects if present:1} She {is/is not:9024} exercising. She {is/is not:9024} adherent to low salt diet.   Outside blood pressures are {enter patient reported home BP, or 'not being checked':1}.  She {does/does not:200015} smoke.  Use of agents associated with  hypertension: {bp agents assoc with hypertension:511::"none"}.   ---------------------------------------------------------------------------------------------------  COPD, mild (HCC) From 01/14/2020-Improved patient quit smoking.  Lumbar disc disease with radiculopathy From 01/14/2020-Patient with chronic pain.  Weight loss, non-intentional From 01/14/2020-Will follow.  We both agree that no work-up indicated at this time but she feels fairly well.  {Show patient history (optional):23778::" "}   Medications: Outpatient Medications Prior to Visit  Medication Sig  . acetaminophen (TYLENOL) 500 MG tablet Take 1,000 mg by mouth every 6 (six) hours as needed. Pain   . albuterol (PROVENTIL) (2.5 MG/3ML) 0.083% nebulizer solution Take 3 mLs (2.5 mg total) by nebulization every 6 (six) hours as needed for wheezing or shortness of breath.  Marland Kitchen atorvastatin (LIPITOR) 40 MG tablet TAKE 1 TABLET BY MOUTH EVERY DAY (Patient not taking: Reported on 01/14/2020)  . cetirizine (ZYRTEC) 10 MG tablet Take 10 mg by mouth at bedtime.  (Patient not taking: Reported on 01/19/2020)  . DULoxetine (CYMBALTA) 20 MG capsule TAKE 1 CAPSULE(20 MG) BY MOUTH DAILY  . esomeprazole (NEXIUM) 40 MG capsule Take 40 mg by mouth daily at 12 noon. (Patient not taking: Reported on 01/19/2020)  . FEROSUL 325 (65 Fe) MG tablet TAKE 1 TABLET(325 MG) BY MOUTH DAILY WITH BREAKFAST  . glucose blood (CONTOUR NEXT  TEST) test strip Check sugar once daily  DX E11.9  . HYDROcodone-acetaminophen (NORCO/VICODIN) 5-325 MG tablet Take 1 tablet by mouth every 6 (six) hours as needed for moderate pain. (Patient not taking: Reported on 01/19/2020)  . hydrOXYzine (ATARAX/VISTARIL) 10 MG tablet TAKE 1 TO 2 TABLETS(10 TO 20 MG) BY MOUTH EVERY 6 HOURS AS NEEDED  . levothyroxine (SYNTHROID) 100 MCG tablet TAKE 1 TABLET(100 MCG) BY MOUTH DAILY BEFORE BREAKFAST  . lisinopril (ZESTRIL) 20 MG tablet TAKE 1 TABLET BY MOUTH EVERY NIGHT AT BEDTIME FOR HIGH  BLOOD PRESSURE (Patient not taking: Reported on 01/14/2020)  . metFORMIN (GLUCOPHAGE) 1000 MG tablet TAKE 1 TABLET(1000 MG) BY MOUTH DAILY WITH SUPPER  . nitrofurantoin, macrocrystal-monohydrate, (MACROBID) 100 MG capsule Take 1 capsule (100 mg total) by mouth 2 (two) times daily.  Marland Kitchen omeprazole (PRILOSEC) 20 MG capsule TAKE 1 CAPSULE(20 MG) BY MOUTH DAILY  . tiotropium (SPIRIVA) 18 MCG inhalation capsule Place 18 mcg into inhaler and inhale daily.    No facility-administered medications prior to visit.    Review of Systems  Constitutional: Negative for appetite change, chills, fatigue and fever.  Respiratory: Negative for chest tightness and shortness of breath.   Cardiovascular: Negative for chest pain and palpitations.  Gastrointestinal: Negative for abdominal pain, nausea and vomiting.  Neurological: Negative for dizziness and weakness.    {Heme  Chem  Endocrine  Serology  Results Review (optional):23779::" "}  Objective    There were no vitals taken for this visit. {Show previous vital signs (optional):23777::" "}  Physical Exam  ***  No results found for any visits on 03/28/20.  Assessment & Plan     ***  No follow-ups on file.      {provider attestation***:1}   Megan Mans, MD  [facility] [phone] (phone) [phone] (fax)  [facility]

## 2020-03-24 ENCOUNTER — Emergency Department
Admission: EM | Admit: 2020-03-24 | Discharge: 2020-03-24 | Disposition: A | Discharge status: Home / Self Care | Payer: Medicare Other | Source: Home / Self Care | Attending: Emergency Medicine | Admitting: Emergency Medicine

## 2020-03-24 ENCOUNTER — Other Ambulatory Visit: Payer: Self-pay

## 2020-03-24 ENCOUNTER — Emergency Department: Payer: Medicare Other

## 2020-03-24 ENCOUNTER — Encounter: Payer: Self-pay | Admitting: Emergency Medicine

## 2020-03-24 ENCOUNTER — Ambulatory Visit: Payer: Self-pay | Admitting: Family Medicine

## 2020-03-24 DIAGNOSIS — I5021 Acute systolic (congestive) heart failure: Secondary | ICD-10-CM | POA: Diagnosis present

## 2020-03-24 DIAGNOSIS — K760 Fatty (change of) liver, not elsewhere classified: Secondary | ICD-10-CM | POA: Diagnosis not present

## 2020-03-24 DIAGNOSIS — Z9660 Presence of unspecified orthopedic joint implant: Secondary | ICD-10-CM | POA: Insufficient documentation

## 2020-03-24 DIAGNOSIS — E039 Hypothyroidism, unspecified: Secondary | ICD-10-CM | POA: Diagnosis present

## 2020-03-24 DIAGNOSIS — Z79899 Other long term (current) drug therapy: Secondary | ICD-10-CM | POA: Insufficient documentation

## 2020-03-24 DIAGNOSIS — J810 Acute pulmonary edema: Secondary | ICD-10-CM | POA: Diagnosis not present

## 2020-03-24 DIAGNOSIS — Z20822 Contact with and (suspected) exposure to covid-19: Secondary | ICD-10-CM | POA: Diagnosis present

## 2020-03-24 DIAGNOSIS — E1169 Type 2 diabetes mellitus with other specified complication: Secondary | ICD-10-CM | POA: Diagnosis not present

## 2020-03-24 DIAGNOSIS — Z90721 Acquired absence of ovaries, unilateral: Secondary | ICD-10-CM | POA: Diagnosis not present

## 2020-03-24 DIAGNOSIS — Z9841 Cataract extraction status, right eye: Secondary | ICD-10-CM | POA: Diagnosis not present

## 2020-03-24 DIAGNOSIS — J90 Pleural effusion, not elsewhere classified: Secondary | ICD-10-CM | POA: Diagnosis not present

## 2020-03-24 DIAGNOSIS — Z7984 Long term (current) use of oral hypoglycemic drugs: Secondary | ICD-10-CM | POA: Insufficient documentation

## 2020-03-24 DIAGNOSIS — I509 Heart failure, unspecified: Secondary | ICD-10-CM | POA: Diagnosis present

## 2020-03-24 DIAGNOSIS — Z8261 Family history of arthritis: Secondary | ICD-10-CM | POA: Diagnosis not present

## 2020-03-24 DIAGNOSIS — Z91048 Other nonmedicinal substance allergy status: Secondary | ICD-10-CM | POA: Diagnosis not present

## 2020-03-24 DIAGNOSIS — Z87891 Personal history of nicotine dependence: Secondary | ICD-10-CM | POA: Insufficient documentation

## 2020-03-24 DIAGNOSIS — R1011 Right upper quadrant pain: Secondary | ICD-10-CM

## 2020-03-24 DIAGNOSIS — Z9842 Cataract extraction status, left eye: Secondary | ICD-10-CM | POA: Diagnosis not present

## 2020-03-24 DIAGNOSIS — M48061 Spinal stenosis, lumbar region without neurogenic claudication: Secondary | ICD-10-CM | POA: Diagnosis present

## 2020-03-24 DIAGNOSIS — R069 Unspecified abnormalities of breathing: Secondary | ICD-10-CM | POA: Diagnosis not present

## 2020-03-24 DIAGNOSIS — J9601 Acute respiratory failure with hypoxia: Secondary | ICD-10-CM | POA: Diagnosis present

## 2020-03-24 DIAGNOSIS — R109 Unspecified abdominal pain: Secondary | ICD-10-CM | POA: Diagnosis present

## 2020-03-24 DIAGNOSIS — E119 Type 2 diabetes mellitus without complications: Secondary | ICD-10-CM | POA: Insufficient documentation

## 2020-03-24 DIAGNOSIS — I10 Essential (primary) hypertension: Secondary | ICD-10-CM | POA: Diagnosis not present

## 2020-03-24 DIAGNOSIS — J449 Chronic obstructive pulmonary disease, unspecified: Secondary | ICD-10-CM | POA: Insufficient documentation

## 2020-03-24 DIAGNOSIS — Z85828 Personal history of other malignant neoplasm of skin: Secondary | ICD-10-CM | POA: Diagnosis not present

## 2020-03-24 DIAGNOSIS — J432 Centrilobular emphysema: Secondary | ICD-10-CM | POA: Diagnosis not present

## 2020-03-24 DIAGNOSIS — R000 Tachycardia, unspecified: Secondary | ICD-10-CM | POA: Diagnosis not present

## 2020-03-24 DIAGNOSIS — E7800 Pure hypercholesterolemia, unspecified: Secondary | ICD-10-CM | POA: Diagnosis present

## 2020-03-24 DIAGNOSIS — R0602 Shortness of breath: Secondary | ICD-10-CM | POA: Insufficient documentation

## 2020-03-24 DIAGNOSIS — E785 Hyperlipidemia, unspecified: Secondary | ICD-10-CM | POA: Diagnosis present

## 2020-03-24 DIAGNOSIS — R0603 Acute respiratory distress: Secondary | ICD-10-CM | POA: Diagnosis not present

## 2020-03-24 DIAGNOSIS — I447 Left bundle-branch block, unspecified: Secondary | ICD-10-CM | POA: Diagnosis present

## 2020-03-24 DIAGNOSIS — E1151 Type 2 diabetes mellitus with diabetic peripheral angiopathy without gangrene: Secondary | ICD-10-CM | POA: Diagnosis present

## 2020-03-24 DIAGNOSIS — R1010 Upper abdominal pain, unspecified: Secondary | ICD-10-CM | POA: Insufficient documentation

## 2020-03-24 DIAGNOSIS — R0689 Other abnormalities of breathing: Secondary | ICD-10-CM | POA: Diagnosis not present

## 2020-03-24 DIAGNOSIS — R0600 Dyspnea, unspecified: Secondary | ICD-10-CM | POA: Diagnosis not present

## 2020-03-24 DIAGNOSIS — I110 Hypertensive heart disease with heart failure: Secondary | ICD-10-CM | POA: Diagnosis present

## 2020-03-24 DIAGNOSIS — I160 Hypertensive urgency: Secondary | ICD-10-CM | POA: Diagnosis present

## 2020-03-24 DIAGNOSIS — Z961 Presence of intraocular lens: Secondary | ICD-10-CM | POA: Diagnosis present

## 2020-03-24 DIAGNOSIS — Z885 Allergy status to narcotic agent status: Secondary | ICD-10-CM | POA: Diagnosis not present

## 2020-03-24 DIAGNOSIS — J811 Chronic pulmonary edema: Secondary | ICD-10-CM | POA: Diagnosis not present

## 2020-03-24 DIAGNOSIS — Z79890 Hormone replacement therapy (postmenopausal): Secondary | ICD-10-CM | POA: Diagnosis not present

## 2020-03-24 DIAGNOSIS — I5031 Acute diastolic (congestive) heart failure: Secondary | ICD-10-CM | POA: Diagnosis not present

## 2020-03-24 DIAGNOSIS — R0902 Hypoxemia: Secondary | ICD-10-CM | POA: Diagnosis not present

## 2020-03-24 LAB — CBC
HCT: 37.5 % (ref 36.0–46.0)
Hemoglobin: 11.3 g/dL — ABNORMAL LOW (ref 12.0–15.0)
MCH: 25.5 pg — ABNORMAL LOW (ref 26.0–34.0)
MCHC: 30.1 g/dL (ref 30.0–36.0)
MCV: 84.5 fL (ref 80.0–100.0)
Platelets: 270 10*3/uL (ref 150–400)
RBC: 4.44 MIL/uL (ref 3.87–5.11)
RDW: 15.6 % — ABNORMAL HIGH (ref 11.5–15.5)
WBC: 5.9 10*3/uL (ref 4.0–10.5)
nRBC: 0 % (ref 0.0–0.2)

## 2020-03-24 LAB — HEPATIC FUNCTION PANEL
ALT: 15 U/L (ref 0–44)
AST: 18 U/L (ref 15–41)
Albumin: 3.6 g/dL (ref 3.5–5.0)
Alkaline Phosphatase: 58 U/L (ref 38–126)
Bilirubin, Direct: <0.1 mg/dL (ref 0.0–0.2)
Total Bilirubin: 0.9 mg/dL (ref 0.3–1.2)
Total Protein: 6.8 g/dL (ref 6.5–8.1)

## 2020-03-24 LAB — TROPONIN I (HIGH SENSITIVITY)
Troponin I (High Sensitivity): 45 ng/L — ABNORMAL HIGH (ref <18)
Troponin I (High Sensitivity): 45 ng/L — ABNORMAL HIGH (ref <18)

## 2020-03-24 LAB — BASIC METABOLIC PANEL
Anion gap: 10 (ref 5–15)
BUN: 14 mg/dL (ref 8–23)
CO2: 24 mmol/L (ref 22–32)
Calcium: 10 mg/dL (ref 8.9–10.3)
Chloride: 105 mmol/L (ref 98–111)
Creatinine, Ser: 0.71 mg/dL (ref 0.44–1.00)
GFR, Estimated: >60 mL/min (ref >60)
Glucose, Bld: 163 mg/dL — ABNORMAL HIGH (ref 70–99)
Potassium: 4.6 mmol/L (ref 3.5–5.1)
Sodium: 139 mmol/L (ref 135–145)

## 2020-03-24 LAB — LIPASE, BLOOD: Lipase: 27 U/L (ref 11–51)

## 2020-03-24 NOTE — ED Triage Notes (Signed)
Pt comes into the ED via POV c/o SHOB that started last week initially but it went away and then it started back up last night.  Pt does have a h/o COPD.  Pt states she is SHOB even without exertion. Pt states it isnt like normal SHOB and that instead it feels like someone is squeezing her around her chest/diaphragm.  Pt denies any cardiac problems.  Denies any CP, dizziness, or nausea.  Pt currently has even and unlabored respirations.

## 2020-03-24 NOTE — Telephone Encounter (Signed)
Pt's son called to report pt. Has had shortness of breath since early in the week, and it has worsened.  Reported pt. States her breathing is short; feels like she is being squeezed around her chest, beneath her breasts.  Denied chest pain.  Also reported runny nose.  Has hx of COPD.   Pt described her shortness of breath as "severe".  Advised pt's son she needs to get to the ER right away.  Advised son to consider calling EMS.  Son stated he was going to take her himself, and would call EMS if she got worse, or if he couldn't get her in the car.  Attempted to call [facility] ER to make aware of pt. Coming in.  Unable to reach anyone in the ER; phone was never answered.    Reason for Disposition . [1] MODERATE difficulty breathing (e.g., speaks in phrases, SOB even at rest, pulse 100-120) AND [2] NEW-onset or WORSE than normal    Son reported pt's. Breathing has become more short over the past several days, and that she was up all night.  Pt. Stated her shortness of breath is severe.  Recommended ER immediately.  Answer Assessment - Initial Assessment Questions 1. RESPIRATORY STATUS: "Describe your breathing?" (e.g., wheezing, shortness of breath, unable to speak, severe coughing)      Feels like she is being squeezed around her chest , beneathe breasts 2. ONSET: "When did this breathing problem begin?"      "Early in the week" 3. PATTERN "Does the difficult breathing come and go, or has it been constant since it started?"      More constant 4. SEVERITY: "How bad is your breathing?" (e.g., mild, moderate, severe)    - MILD: No SOB at rest, mild SOB with walking, speaks normally in sentences, can lay down, no retractions, pulse < 100.    - MODERATE: SOB at rest, SOB with minimal exertion and prefers to sit, cannot lie down flat, speaks in phrases, mild retractions, audible wheezing, pulse 100-120.    - SEVERE: Very SOB at rest, speaks in single words, struggling to breathe, sitting hunched forward,  retractions, pulse > 120      severe 5. RECURRENT SYMPTOM: "Have you had difficulty breathing before?" If Yes, ask: "When was the last time?" and "What happened that time?"      *No Answer* 6. CARDIAC HISTORY: "Do you have any history of heart disease?" (e.g., heart attack, angina, bypass surgery, angioplasty)      *No Answer* 7. LUNG HISTORY: "Do you have any history of lung disease?"  (e.g., pulmonary embolus, asthma, emphysema)     COPD  8. CAUSE: "What do you think is causing the breathing problem?"      *No Answer* 9. OTHER SYMPTOMS: "Do you have any other symptoms? (e.g., dizziness, runny nose, cough, chest pain, fever)     Cannot get her breath; denied chest pain, nose is running, denied fever 10. PREGNANCY: "Is there any chance you are pregnant?" "When was your last menstrual period?"       n/a 11. TRAVEL: "Have you traveled out of the country in the last month?" (e.g., travel history, exposures)       *No Answer*  Protocols used: BREATHING DIFFICULTY-A-AH

## 2020-03-24 NOTE — ED Provider Notes (Signed)
[facility] Emergency Department Provider Note  Time seen: 2:50 PM  I have reviewed the triage vital signs and the nursing notes.   HISTORY  Chief Complaint Abdominal pain/shortness of breath  HPI Stephanieann L Zaugg is a 86 y.o. female with a past medical history of hypertension, hyperlipidemia, diabetes, COPD, presents to the emergency department for upper abdominal discomfort. According to the patient over the past 1 week she has had intermittent episodes of a gripping or squeezing type pain across her upper abdomen mostly in the right upper quadrant epigastrium. Patient states when this occurs it will take her breath away make her feel short of breath. Denies any pain into her chest. States her symptoms will last usually very short duration before they resolve however last night the symptoms lasted for hours overnight which concerned the patient's was came to the emergency department today for evaluation. Patient denies any pain or shortness of breath currently. Denies any fever cough. Denies any nausea vomiting or diarrhea. Patient does state a history of reflux in the past but states this does not feel similar.   Past Medical History:  Diagnosis Date  . Actinic keratosis   . Arthritis    spinal stenosis  . COPD (chronic obstructive pulmonary disease) (HCC)   . Diabetes mellitus without complication (HCC)   . Gastric ulcer 4/12   treated with Prolisec- states no problems now  . Hyperlipidemia   . Hypertension    PCP Dr Julieanne Manson   [place]  . Hypothyroidism   . Neuromuscular disorder (HCC)    slight numbness right toes- comes and goes  . Peripheral vascular disease (HCC)    varicose veins left leg  . Pneumonia   . Seasonal allergies   . Shortness of breath   . Skin cancer    multiple from face  . Squamous cell carcinoma of skin 03/23/2013   L dorsal hand  . Squamous cell carcinoma of skin 02/04/2014   R forearm/in situ, L pretibial  . Squamous  cell carcinoma of skin 09/24/2018   R thumb    Patient Active Problem List   Diagnosis Date Noted  . Diabetes mellitus type 2, uncomplicated (HCC) 08/09/2016  . COPD, mild (HCC) 09/17/2014  . Clinical depression 09/17/2014  . Elevated platelet count 09/17/2014  . Elevated erythrocyte sedimentation rate 09/17/2014  . History of tobacco use 09/17/2014  . Hypercholesteremia 09/17/2014  . BP (high blood pressure) 09/17/2014  . Adult hypothyroidism 09/17/2014  . Cannot sleep 09/17/2014  . Lumbar disc disease with radiculopathy 09/17/2014  . Arthritis, degenerative 09/17/2014  . Excess weight 09/17/2014  . Vertebrobasilar circulation transient ischemic attack 09/17/2014  . Lung nodule, multiple 09/17/2014  . Basilar artery insufficiency 09/17/2014  . Neuritis or radiculitis due to rupture of lumbar intervertebral disc 08/25/2013  . Degenerative arthritis of lumbar spine 08/25/2013  . Lumbar canal stenosis 08/25/2013  . Smoker 07/15/2012  . Back pain 07/15/2012  . SOB (shortness of breath) 07/15/2012  . Hyperlipidemia 07/15/2012  . Essential hypertension 07/15/2012    Past Surgical History:  Procedure Laterality Date  . BACK SURGERY     4/12  Dr Simonne Come- for lumbar stenosis  . BREAST CYST ASPIRATION Left 1965   negative  . BREAST SURGERY  1962   lumpectomy with biopsy   left  . CARPAL TUNNEL RELEASE     right  . COLONOSCOPY    . COLONOSCOPY WITH PROPOFOL N/A 06/30/2015   Procedure: COLONOSCOPY WITH PROPOFOL;  Surgeon: Ezzard Standing  Candace Cruise, MD;  Location: [place];  Service: Gastroenterology;  Laterality: N/A;  . COLONOSCOPY WITH PROPOFOL N/A 01/22/2017   Procedure: COLONOSCOPY WITH PROPOFOL;  Surgeon: Lollie Sails, MD;  Location[facility]COPY;  Service: Endoscopy;  Laterality: N/A;  . COLONOSCOPY WITH PROPOFOL N/A 01/28/2019   Procedure: COLONOSCOPY WITH PROPOFOL;  Surgeon: Toledo, Benay Pike, MD;  Location: [facility] ENDOSCOPY;  Service: Gastroenterology;  Laterality: N/A;  .  ESOPHAGOGASTRODUODENOSCOPY    . ESOPHAGOGASTRODUODENOSCOPY (EGD) WITH PROPOFOL N/A 01/28/2019   Procedure: ESOPHAGOGASTRODUODENOSCOPY (EGD) WITH PROPOFOL;  Surgeon: Toledo, Benay Pike, MD;  Location: [facility] ENDOSCOPY;  Service: Gastroenterology;  Laterality: N/A;  . EYE SURGERY     cataract extraction with IOL bilaterally  . JOINT REPLACEMENT    . LUMBAR LAMINECTOMY/DECOMPRESSION MICRODISCECTOMY  05/24/2011   Procedure: LUMBAR LAMINECTOMY/DECOMPRESSION MICRODISCECTOMY 2 LEVELS;  Surgeon: Magnus Sinning, MD;  Location: [facility];  Service: Orthopedics;  Laterality: N/A;  L2-L3, L3-L4 (x-ray)  . RIGHT OOPHORECTOMY     due to STAPH INFECTION    Prior to Admission medications   Medication Sig Start Date End Date Taking? Authorizing Provider  acetaminophen (TYLENOL) 500 MG tablet Take 1,000 mg by mouth every 6 (six) hours as needed. Pain     [provider]  albuterol (PROVENTIL) (2.5 MG/3ML) 0.083% nebulizer solution Take 3 mLs (2.5 mg total) by nebulization every 6 (six) hours as needed for wheezing or shortness of breath. 04/20/16   Carmon Ginsberg, PA  atorvastatin (LIPITOR) 40 MG tablet TAKE 1 TABLET BY MOUTH EVERY DAY Patient not taking: Reported on 01/14/2020 11/05/19   Jerrol Banana., MD  cetirizine (ZYRTEC) 10 MG tablet Take 10 mg by mouth at bedtime.  Patient not taking: Reported on 01/19/2020    [provider]  DULoxetine (CYMBALTA) 20 MG capsule TAKE 1 CAPSULE(20 MG) BY MOUTH DAILY 08/17/19   Jerrol Banana., MD  esomeprazole (NEXIUM) 40 MG capsule Take 40 mg by mouth daily at 12 noon. Patient not taking: Reported on 01/19/2020    [provider]  FEROSUL 325 (65 Fe) MG tablet TAKE 1 TABLET(325 MG) BY MOUTH DAILY WITH BREAKFAST 04/16/19   Jerrol Banana., MD  glucose blood (CONTOUR NEXT TEST) test strip Check sugar once daily  DX E11.9 11/16/16   Jerrol Banana., MD  HYDROcodone-acetaminophen (NORCO/VICODIN) 5-325 MG tablet Take 1 tablet  by mouth every 6 (six) hours as needed for moderate pain. Patient not taking: Reported on 01/19/2020 01/22/18   Jerrol Banana., MD  hydrOXYzine (ATARAX/VISTARIL) 10 MG tablet TAKE 1 TO 2 TABLETS(10 TO 20 MG) BY MOUTH EVERY 6 HOURS AS NEEDED 09/16/19   Jerrol Banana., MD  levothyroxine (SYNTHROID) 100 MCG tablet TAKE 1 TABLET(100 MCG) BY MOUTH DAILY BEFORE BREAKFAST 08/17/19   Jerrol Banana., MD  lisinopril (ZESTRIL) 20 MG tablet TAKE 1 TABLET BY MOUTH EVERY NIGHT AT BEDTIME FOR HIGH BLOOD PRESSURE Patient not taking: Reported on 01/14/2020 09/08/19   Chrismon, Vickki Muff, PA-C  metFORMIN (GLUCOPHAGE) 1000 MG tablet TAKE 1 TABLET(1000 MG) BY MOUTH DAILY WITH SUPPER 03/03/20   Jerrol Banana., MD  nitrofurantoin, macrocrystal-monohydrate, (MACROBID) 100 MG capsule Take 1 capsule (100 mg total) by mouth 2 (two) times daily. 01/19/20   Jerrol Banana., MD  omeprazole (PRILOSEC) 20 MG capsule TAKE 1 CAPSULE(20 MG) BY MOUTH DAILY 03/03/20   Jerrol Banana., MD  tiotropium [facility]) 18 MCG inhalation capsule Place 18 mcg into inhaler  and inhale daily.     [provider]    Allergies  Allergen Reactions  . Oxycodone Other (See Comments)    Mental status change  . Prednisone     swelling, bruising.    Family History  Problem Relation Age of Onset  . Dementia Mother   . Lung cancer Father   . Heart disease Father   . Breast cancer Maternal Aunt   . Arthritis Son     Social History Social History   Tobacco Use  . Smoking status: Former Smoker    Packs/day: 0.50    Years: 50.00    Pack years: 25.00    Types: Cigarettes    Quit date: 05/16/2004    Years since quitting: 15.8  . Smokeless tobacco: Never Used  Substance Use Topics  . Alcohol use: Yes    Comment: socially  . Drug use: No    Review of Systems Constitutional: Negative for fever. Cardiovascular: Negative for chest pain. Respiratory: States when she gets the abdominal pain  it "takes my breath" denies any shortness of breath currently. No pleuritic pain. Gastrointestinal: Positive for upper abdominal pain which is intermittent. None currently. Negative for nausea vomiting or diarrhea Genitourinary: Negative for urinary compaints Musculoskeletal: Negative for musculoskeletal complaints Skin: Negative for skin complaints  Neurological: Negative for headache All other ROS negative  ____________________________________________   PHYSICAL EXAM:  VITAL SIGNS: ED Triage Vitals  Enc Vitals Group     BP 03/24/20 0936 124/60     Pulse Rate 03/24/20 0936 98     Resp 03/24/20 0936 18     Temp 03/24/20 0936 98.7 F (37.1 C)     Temp Source 03/24/20 0936 Oral     SpO2 03/24/20 0936 93 %     Weight 03/24/20 0947 179 lb (81.2 kg)     Height 03/24/20 0947 5\' 4"  (1.626 m)     Head Circumference --      Peak Flow --      Pain Score 03/24/20 0946 0     Pain Loc --      Pain Edu? --      Excl. in [place]? --    Constitutional: Alert and oriented. Well appearing and in no distress. Eyes: Normal exam ENT      Head: Normocephalic and atraumatic.      Mouth/Throat: Mucous membranes are moist. Cardiovascular: Normal rate, regular rhythm.  Respiratory: Normal respiratory effort without tachypnea nor retractions. Breath sounds are clear  Gastrointestinal: Soft and nontender. No distention. Benign abdominal exam. Musculoskeletal: Nontender with normal range of motion in all extremities.  Neurologic:  Normal speech and language. No gross focal neurologic deficits  Skin:  Skin is warm, dry and intact.  Psychiatric: Mood and affect are normal.  ____________________________________________    EKG  EKG viewed and interpreted by myself shows a normal sinus rhythm at 97 bpm with a slightly widened QRS, right axis deviation, largely normal intervals with nonspecific ST changes.  ____________________________________________    RADIOLOGY  Chest x-ray shows mild bibasilar  interstitial densities possibly edema  ____________________________________________   INITIAL IMPRESSION / ASSESSMENT AND PLAN / ED COURSE  Pertinent labs & imaging results that were available during my care of the patient were reviewed by me and considered in my medical decision making (see chart for details).   Patient presents to the emergency department for intermittent upper abdominal pain and shortness of breath. States the upper abdominal pain has been a squeezing type pain that  occurs intermittently and usually last for a very brief time however last night lasted for several hours which concerned the patient so she came to the emergency department today for evaluation. Here the patient appears well currently. No pain currently. Has a benign abdominal exam. Denies any chest pain at any point. No shortness of breath nausea or diaphoresis. Patient's work-up shows largely normal results troponin is slightly elevated however repeat troponin is unchanged. However given the patient's description of her pain across the upper abdomen I have added on hepatic function panel as well as a lipase as well as a right upper quadrant ultrasound. Patient's vital signs are reassuring including a 99% room air saturation with a normal respiratory rate and the patient denies any shortness of breath at this time. We will check the pack function panel lipase and right upper quadrant ultrasound. Otherwise I believe the patient will be safe for discharge home if the results are normal.   Kathryn [name] was evaluated in Emergency Department on 03/24/2020 for the symptoms described in the history of present illness. She was evaluated in the context of the global COVID-19 pandemic, which necessitated consideration that the patient might be at risk for infection with the SARS-CoV-2 virus that causes COVID-19. Institutional protocols and algorithms that pertain to the evaluation of patients at risk for COVID-19 are in a state  of rapid change based on information released by regulatory bodies including the CDC and federal and state organizations. These policies and algorithms were followed during the patient's care in the ED.  ____________________________________________   FINAL CLINICAL IMPRESSION(S) / ED DIAGNOSES  Upper abdominal pain Shortness of breath   Harvest Dark, MD 03/24/20 1454

## 2020-03-24 NOTE — Discharge Instructions (Addendum)
Please seek medical attention for any high fevers, chest pain, shortness of breath, change in behavior, persistent vomiting, bloody stool or any other new or concerning symptoms.  

## 2020-03-24 NOTE — ED Provider Notes (Signed)
Lipase and hepatic function panel within normal limits. RUQ ultrasound with distended gallbladder with gallstones or cholecystitis. Discussed this finding with the patient. She continues to be pain free. Did discuss distention and that there still could be some gallbladder dysfunction. Discussed importance of follow up with PCP. Discussed return precautions.   Phineas Semen, MD 03/24/20 [phone]

## 2020-03-25 LAB — SARS CORONAVIRUS 2 (TAT 6-24 HRS): SARS Coronavirus 2: NEGATIVE

## 2020-03-26 ENCOUNTER — Emergency Department: Payer: Medicare Other

## 2020-03-26 ENCOUNTER — Other Ambulatory Visit: Payer: Self-pay

## 2020-03-26 ENCOUNTER — Encounter: Payer: Self-pay | Admitting: Radiology

## 2020-03-26 ENCOUNTER — Inpatient Hospital Stay
Admission: EM | Admit: 2020-03-26 | Discharge: 2020-03-29 | DRG: 291 | Disposition: A | Discharge status: Home / Self Care | Payer: Medicare Other | Attending: Internal Medicine | Admitting: Internal Medicine

## 2020-03-26 DIAGNOSIS — Z90721 Acquired absence of ovaries, unilateral: Secondary | ICD-10-CM

## 2020-03-26 DIAGNOSIS — I447 Left bundle-branch block, unspecified: Secondary | ICD-10-CM

## 2020-03-26 DIAGNOSIS — J9601 Acute respiratory failure with hypoxia: Secondary | ICD-10-CM | POA: Diagnosis present

## 2020-03-26 DIAGNOSIS — Z9841 Cataract extraction status, right eye: Secondary | ICD-10-CM

## 2020-03-26 DIAGNOSIS — Z7951 Long term (current) use of inhaled steroids: Secondary | ICD-10-CM

## 2020-03-26 DIAGNOSIS — I160 Hypertensive urgency: Secondary | ICD-10-CM | POA: Diagnosis present

## 2020-03-26 DIAGNOSIS — E785 Hyperlipidemia, unspecified: Secondary | ICD-10-CM | POA: Diagnosis present

## 2020-03-26 DIAGNOSIS — Z91048 Other nonmedicinal substance allergy status: Secondary | ICD-10-CM

## 2020-03-26 DIAGNOSIS — I509 Heart failure, unspecified: Secondary | ICD-10-CM

## 2020-03-26 DIAGNOSIS — Z8261 Family history of arthritis: Secondary | ICD-10-CM

## 2020-03-26 DIAGNOSIS — Z7984 Long term (current) use of oral hypoglycemic drugs: Secondary | ICD-10-CM

## 2020-03-26 DIAGNOSIS — E1151 Type 2 diabetes mellitus with diabetic peripheral angiopathy without gangrene: Secondary | ICD-10-CM | POA: Diagnosis present

## 2020-03-26 DIAGNOSIS — Z9842 Cataract extraction status, left eye: Secondary | ICD-10-CM

## 2020-03-26 DIAGNOSIS — J449 Chronic obstructive pulmonary disease, unspecified: Secondary | ICD-10-CM | POA: Diagnosis present

## 2020-03-26 DIAGNOSIS — I471 Supraventricular tachycardia: Secondary | ICD-10-CM

## 2020-03-26 DIAGNOSIS — Z885 Allergy status to narcotic agent status: Secondary | ICD-10-CM

## 2020-03-26 DIAGNOSIS — M48061 Spinal stenosis, lumbar region without neurogenic claudication: Secondary | ICD-10-CM | POA: Diagnosis present

## 2020-03-26 DIAGNOSIS — Z20822 Contact with and (suspected) exposure to covid-19: Secondary | ICD-10-CM | POA: Diagnosis present

## 2020-03-26 DIAGNOSIS — I5021 Acute systolic (congestive) heart failure: Secondary | ICD-10-CM | POA: Diagnosis present

## 2020-03-26 DIAGNOSIS — Z8249 Family history of ischemic heart disease and other diseases of the circulatory system: Secondary | ICD-10-CM

## 2020-03-26 DIAGNOSIS — Z888 Allergy status to other drugs, medicaments and biological substances status: Secondary | ICD-10-CM

## 2020-03-26 DIAGNOSIS — Z87891 Personal history of nicotine dependence: Secondary | ICD-10-CM

## 2020-03-26 DIAGNOSIS — Z79890 Hormone replacement therapy (postmenopausal): Secondary | ICD-10-CM

## 2020-03-26 DIAGNOSIS — E7800 Pure hypercholesterolemia, unspecified: Secondary | ICD-10-CM | POA: Diagnosis present

## 2020-03-26 DIAGNOSIS — R109 Unspecified abdominal pain: Secondary | ICD-10-CM | POA: Diagnosis present

## 2020-03-26 DIAGNOSIS — I110 Hypertensive heart disease with heart failure: Principal | ICD-10-CM | POA: Diagnosis present

## 2020-03-26 DIAGNOSIS — Z961 Presence of intraocular lens: Secondary | ICD-10-CM | POA: Diagnosis present

## 2020-03-26 DIAGNOSIS — E039 Hypothyroidism, unspecified: Secondary | ICD-10-CM

## 2020-03-26 DIAGNOSIS — Z85828 Personal history of other malignant neoplasm of skin: Secondary | ICD-10-CM

## 2020-03-26 DIAGNOSIS — Z79899 Other long term (current) drug therapy: Secondary | ICD-10-CM

## 2020-03-26 HISTORY — DX: Left bundle-branch block, unspecified: I44.7

## 2020-03-26 LAB — CBC WITH DIFFERENTIAL/PLATELET
Abs Immature Granulocytes: 0.03 10*3/uL (ref 0.00–0.07)
Basophils Absolute: 0.1 10*3/uL (ref 0.0–0.1)
Basophils Relative: 1 %
Eosinophils Absolute: 0.3 10*3/uL (ref 0.0–0.5)
Eosinophils Relative: 3 %
HCT: 38.2 % (ref 36.0–46.0)
Hemoglobin: 11.1 g/dL — ABNORMAL LOW (ref 12.0–15.0)
Immature Granulocytes: 0 %
Lymphocytes Relative: 32 %
Lymphs Abs: 2.6 10*3/uL (ref 0.7–4.0)
MCH: 25.3 pg — ABNORMAL LOW (ref 26.0–34.0)
MCHC: 29.1 g/dL — ABNORMAL LOW (ref 30.0–36.0)
MCV: 87 fL (ref 80.0–100.0)
Monocytes Absolute: 0.8 10*3/uL (ref 0.1–1.0)
Monocytes Relative: 10 %
Neutro Abs: 4.4 10*3/uL (ref 1.7–7.7)
Neutrophils Relative %: 54 %
Platelets: 273 10*3/uL (ref 150–400)
RBC: 4.39 MIL/uL (ref 3.87–5.11)
RDW: 15.6 % — ABNORMAL HIGH (ref 11.5–15.5)
WBC: 8.1 10*3/uL (ref 4.0–10.5)
nRBC: 0 % (ref 0.0–0.2)

## 2020-03-26 MED ORDER — IPRATROPIUM-ALBUTEROL 0.5-2.5 (3) MG/3ML IN SOLN
3.0000 mL | Freq: Once | RESPIRATORY_TRACT | Status: AC
  Administered 2020-03-26: 3 mL via RESPIRATORY_TRACT

## 2020-03-26 MED ORDER — IPRATROPIUM-ALBUTEROL 0.5-2.5 (3) MG/3ML IN SOLN: 3.0000 mL | Freq: Once | RESPIRATORY_TRACT | Status: AC

## 2020-03-26 MED ORDER — IPRATROPIUM-ALBUTEROL 0.5-2.5 (3) MG/3ML IN SOLN
RESPIRATORY_TRACT | Status: AC
  Administered 2020-03-26: 3 mL via RESPIRATORY_TRACT
  Filled 2020-03-26: qty 9

## 2020-03-26 NOTE — ED Triage Notes (Addendum)
PT arrives via [facility] with [facility]. Pt has history of COPD. Pt was given duoneb, albuterol solumedrol 125 and enalapril 1.25 x1 doses and 2in nitro paste for HTN at home with EMS. Pt arrived on cpap and was placed on bipap due to labored breathing with wheezes and rales. Pt alert and orient and able to answer questions with dyspnea. Pt arrived with 20g in R arm, and 22g placed in ED. PT verbalizes covid shot x3 and flu vaccine.

## 2020-03-27 DIAGNOSIS — Z79899 Other long term (current) drug therapy: Secondary | ICD-10-CM | POA: Diagnosis not present

## 2020-03-27 DIAGNOSIS — Z79890 Hormone replacement therapy (postmenopausal): Secondary | ICD-10-CM | POA: Diagnosis not present

## 2020-03-27 DIAGNOSIS — Z961 Presence of intraocular lens: Secondary | ICD-10-CM | POA: Diagnosis present

## 2020-03-27 DIAGNOSIS — I160 Hypertensive urgency: Secondary | ICD-10-CM | POA: Diagnosis present

## 2020-03-27 DIAGNOSIS — E1151 Type 2 diabetes mellitus with diabetic peripheral angiopathy without gangrene: Secondary | ICD-10-CM | POA: Diagnosis present

## 2020-03-27 DIAGNOSIS — I5021 Acute systolic (congestive) heart failure: Secondary | ICD-10-CM | POA: Diagnosis present

## 2020-03-27 DIAGNOSIS — J9601 Acute respiratory failure with hypoxia: Secondary | ICD-10-CM | POA: Insufficient documentation

## 2020-03-27 DIAGNOSIS — Z85828 Personal history of other malignant neoplasm of skin: Secondary | ICD-10-CM | POA: Diagnosis not present

## 2020-03-27 DIAGNOSIS — J810 Acute pulmonary edema: Secondary | ICD-10-CM

## 2020-03-27 DIAGNOSIS — E1169 Type 2 diabetes mellitus with other specified complication: Secondary | ICD-10-CM

## 2020-03-27 DIAGNOSIS — I509 Heart failure, unspecified: Secondary | ICD-10-CM | POA: Diagnosis present

## 2020-03-27 DIAGNOSIS — E039 Hypothyroidism, unspecified: Secondary | ICD-10-CM | POA: Diagnosis present

## 2020-03-27 DIAGNOSIS — I447 Left bundle-branch block, unspecified: Secondary | ICD-10-CM | POA: Diagnosis present

## 2020-03-27 DIAGNOSIS — E785 Hyperlipidemia, unspecified: Secondary | ICD-10-CM

## 2020-03-27 DIAGNOSIS — Z20822 Contact with and (suspected) exposure to covid-19: Secondary | ICD-10-CM | POA: Diagnosis present

## 2020-03-27 DIAGNOSIS — Z90721 Acquired absence of ovaries, unilateral: Secondary | ICD-10-CM | POA: Diagnosis not present

## 2020-03-27 DIAGNOSIS — Z885 Allergy status to narcotic agent status: Secondary | ICD-10-CM | POA: Diagnosis not present

## 2020-03-27 DIAGNOSIS — Z9841 Cataract extraction status, right eye: Secondary | ICD-10-CM | POA: Diagnosis not present

## 2020-03-27 DIAGNOSIS — Z7984 Long term (current) use of oral hypoglycemic drugs: Secondary | ICD-10-CM | POA: Diagnosis not present

## 2020-03-27 DIAGNOSIS — J449 Chronic obstructive pulmonary disease, unspecified: Secondary | ICD-10-CM

## 2020-03-27 DIAGNOSIS — E119 Type 2 diabetes mellitus without complications: Secondary | ICD-10-CM | POA: Diagnosis not present

## 2020-03-27 DIAGNOSIS — M48061 Spinal stenosis, lumbar region without neurogenic claudication: Secondary | ICD-10-CM | POA: Diagnosis present

## 2020-03-27 DIAGNOSIS — I5031 Acute diastolic (congestive) heart failure: Secondary | ICD-10-CM

## 2020-03-27 DIAGNOSIS — Z91048 Other nonmedicinal substance allergy status: Secondary | ICD-10-CM | POA: Diagnosis not present

## 2020-03-27 DIAGNOSIS — Z8261 Family history of arthritis: Secondary | ICD-10-CM | POA: Diagnosis not present

## 2020-03-27 DIAGNOSIS — J432 Centrilobular emphysema: Secondary | ICD-10-CM

## 2020-03-27 DIAGNOSIS — Z9842 Cataract extraction status, left eye: Secondary | ICD-10-CM | POA: Diagnosis not present

## 2020-03-27 DIAGNOSIS — R109 Unspecified abdominal pain: Secondary | ICD-10-CM | POA: Diagnosis present

## 2020-03-27 DIAGNOSIS — I10 Essential (primary) hypertension: Secondary | ICD-10-CM | POA: Diagnosis not present

## 2020-03-27 DIAGNOSIS — I110 Hypertensive heart disease with heart failure: Secondary | ICD-10-CM | POA: Diagnosis present

## 2020-03-27 DIAGNOSIS — E7800 Pure hypercholesterolemia, unspecified: Secondary | ICD-10-CM | POA: Diagnosis present

## 2020-03-27 LAB — COMPREHENSIVE METABOLIC PANEL
ALT: 15 U/L (ref 0–44)
AST: 21 U/L (ref 15–41)
Albumin: 3.8 g/dL (ref 3.5–5.0)
Alkaline Phosphatase: 70 U/L (ref 38–126)
Anion gap: 11 (ref 5–15)
BUN: 18 mg/dL (ref 8–23)
CO2: 22 mmol/L (ref 22–32)
Calcium: 9.5 mg/dL (ref 8.9–10.3)
Chloride: 105 mmol/L (ref 98–111)
Creatinine, Ser: 0.78 mg/dL (ref 0.44–1.00)
GFR, Estimated: >60 mL/min (ref >60)
Glucose, Bld: 223 mg/dL — ABNORMAL HIGH (ref 70–99)
Potassium: 3.6 mmol/L (ref 3.5–5.1)
Sodium: 138 mmol/L (ref 135–145)
Total Bilirubin: 0.6 mg/dL (ref 0.3–1.2)
Total Protein: 7.4 g/dL (ref 6.5–8.1)

## 2020-03-27 LAB — URINALYSIS, COMPLETE (UACMP) WITH MICROSCOPIC
Bilirubin Urine: NEGATIVE
Glucose, UA: >=500 mg/dL — AB
Hgb urine dipstick: NEGATIVE
Ketones, ur: NEGATIVE mg/dL
Nitrite: NEGATIVE
Protein, ur: NEGATIVE mg/dL
Specific Gravity, Urine: 1.018 (ref 1.005–1.030)
pH: 5 (ref 5.0–8.0)

## 2020-03-27 LAB — BASIC METABOLIC PANEL
Anion gap: 14 (ref 5–15)
BUN: 19 mg/dL (ref 8–23)
CO2: 19 mmol/L — ABNORMAL LOW (ref 22–32)
Calcium: 9 mg/dL (ref 8.9–10.3)
Chloride: 106 mmol/L (ref 98–111)
Creatinine, Ser: 0.83 mg/dL (ref 0.44–1.00)
GFR, Estimated: >60 mL/min (ref >60)
Glucose, Bld: 237 mg/dL — ABNORMAL HIGH (ref 70–99)
Potassium: 4.5 mmol/L (ref 3.5–5.1)
Sodium: 139 mmol/L (ref 135–145)

## 2020-03-27 LAB — CBG MONITORING, ED
Glucose-Capillary: 221 mg/dL — ABNORMAL HIGH (ref 70–99)
Glucose-Capillary: 167 mg/dL — ABNORMAL HIGH (ref 70–99)
Glucose-Capillary: 160 mg/dL — ABNORMAL HIGH (ref 70–99)
Glucose-Capillary: 174 mg/dL — ABNORMAL HIGH (ref 70–99)

## 2020-03-27 LAB — CBC
HCT: 33.6 % — ABNORMAL LOW (ref 36.0–46.0)
Hemoglobin: 10 g/dL — ABNORMAL LOW (ref 12.0–15.0)
MCH: 25.6 pg — ABNORMAL LOW (ref 26.0–34.0)
MCHC: 29.8 g/dL — ABNORMAL LOW (ref 30.0–36.0)
MCV: 86.2 fL (ref 80.0–100.0)
Platelets: 208 10*3/uL (ref 150–400)
RBC: 3.9 MIL/uL (ref 3.87–5.11)
RDW: 15.7 % — ABNORMAL HIGH (ref 11.5–15.5)
WBC: 7.7 10*3/uL (ref 4.0–10.5)
nRBC: 0 % (ref 0.0–0.2)

## 2020-03-27 LAB — BRAIN NATRIURETIC PEPTIDE: B Natriuretic Peptide: 337.8 pg/mL — ABNORMAL HIGH (ref 0.0–100.0)

## 2020-03-27 LAB — LACTIC ACID, PLASMA
Lactic Acid, Venous: 3.5 mmol/L (ref 0.5–1.9)
Lactic Acid, Venous: 4.2 mmol/L (ref 0.5–1.9)

## 2020-03-27 LAB — TROPONIN I (HIGH SENSITIVITY)
Troponin I (High Sensitivity): 28 ng/L — ABNORMAL HIGH (ref <18)
Troponin I (High Sensitivity): 31 ng/L — ABNORMAL HIGH (ref <18)

## 2020-03-27 LAB — RESP PANEL BY RT-PCR (FLU A&B, COVID) ARPGX2
Influenza A by PCR: NEGATIVE
Influenza B by PCR: NEGATIVE
SARS Coronavirus 2 by RT PCR: NEGATIVE

## 2020-03-27 LAB — HEMOGLOBIN A1C
Hgb A1c MFr Bld: 6.9 % — ABNORMAL HIGH (ref 4.8–5.6)
Mean Plasma Glucose: 151.33 mg/dL

## 2020-03-27 LAB — PROCALCITONIN: Procalcitonin: <0.1 ng/mL

## 2020-03-27 MED ORDER — LACTATED RINGERS IV BOLUS: 1000.0000 mL | Freq: Once | INTRAVENOUS | Status: DC

## 2020-03-27 MED ORDER — ENOXAPARIN SODIUM 40 MG/0.4ML ~~LOC~~ SOLN: 0.5000 mg/kg | SUBCUTANEOUS | Status: DC

## 2020-03-27 MED ORDER — ONDANSETRON HCL 4 MG PO TABS: 4.0000 mg | ORAL_TABLET | Freq: Four times a day (QID) | ORAL | Status: DC | PRN

## 2020-03-27 MED ORDER — LACTATED RINGERS IV BOLUS
500.0000 mL | Freq: Once | INTRAVENOUS | Status: AC
  Administered 2020-03-27: 500 mL via INTRAVENOUS

## 2020-03-27 MED ORDER — HYDROXYZINE HCL 10 MG PO TABS
10.0000 mg | ORAL_TABLET | Freq: Four times a day (QID) | ORAL | Status: DC | PRN
  Filled 2020-03-27: qty 1

## 2020-03-27 MED ORDER — ONDANSETRON HCL 4 MG/2ML IJ SOLN: 4.0000 mg | Freq: Four times a day (QID) | INTRAMUSCULAR | Status: DC | PRN

## 2020-03-27 MED ORDER — LABETALOL HCL 5 MG/ML IV SOLN: 20.0000 mg | INTRAVENOUS | Status: DC | PRN

## 2020-03-27 MED ORDER — ACETAMINOPHEN 325 MG PO TABS: 650.0000 mg | ORAL_TABLET | Freq: Four times a day (QID) | ORAL | Status: DC | PRN

## 2020-03-27 MED ORDER — ACETAMINOPHEN 650 MG RE SUPP: 650.0000 mg | Freq: Four times a day (QID) | RECTAL | Status: DC | PRN

## 2020-03-27 MED ORDER — LOSARTAN POTASSIUM 25 MG PO TABS
25.0000 mg | ORAL_TABLET | Freq: Every evening | ORAL | Status: DC
  Administered 2020-03-27 – 2020-03-28 (×2): 25 mg via ORAL
  Filled 2020-03-27 (×2): qty 1

## 2020-03-27 MED ORDER — TIOTROPIUM BROMIDE MONOHYDRATE 18 MCG IN CAPS
18.0000 ug | ORAL_CAPSULE | Freq: Every day | RESPIRATORY_TRACT | Status: DC
  Administered 2020-03-28: 18 ug via RESPIRATORY_TRACT
  Filled 2020-03-27 (×2): qty 5

## 2020-03-27 MED ORDER — FUROSEMIDE 10 MG/ML IJ SOLN
20.0000 mg | Freq: Two times a day (BID) | INTRAMUSCULAR | Status: DC
  Administered 2020-03-27: 20 mg via INTRAVENOUS
  Filled 2020-03-27: qty 4

## 2020-03-27 MED ORDER — INSULIN ASPART 100 UNIT/ML ~~LOC~~ SOLN
0.0000 [IU] | Freq: Three times a day (TID) | SUBCUTANEOUS | Status: DC
  Administered 2020-03-27 (×2): 2 [IU] via SUBCUTANEOUS
  Administered 2020-03-27: 3 [IU] via SUBCUTANEOUS
  Administered 2020-03-27: 2 [IU] via SUBCUTANEOUS
  Administered 2020-03-28 – 2020-03-29 (×3): 1 [IU] via SUBCUTANEOUS
  Filled 2020-03-27 (×8): qty 1

## 2020-03-27 MED ORDER — FERROUS SULFATE 325 (65 FE) MG PO TABS
325.0000 mg | ORAL_TABLET | Freq: Every day | ORAL | Status: DC
Start: 2020-03-27 — End: 2020-03-27
  Filled 2020-03-27: qty 1

## 2020-03-27 MED ORDER — PANTOPRAZOLE SODIUM 40 MG PO TBEC
40.0000 mg | DELAYED_RELEASE_TABLET | Freq: Every day | ORAL | Status: DC
  Administered 2020-03-27 – 2020-03-29 (×3): 40 mg via ORAL
  Filled 2020-03-27 (×3): qty 1

## 2020-03-27 MED ORDER — ALBUTEROL SULFATE (2.5 MG/3ML) 0.083% IN NEBU: 2.5000 mg | INHALATION_SOLUTION | Freq: Four times a day (QID) | RESPIRATORY_TRACT | Status: DC | PRN

## 2020-03-27 MED ORDER — DULOXETINE HCL 20 MG PO CPEP
20.0000 mg | ORAL_CAPSULE | Freq: Every day | ORAL | Status: DC
  Filled 2020-03-27: qty 1

## 2020-03-27 MED ORDER — FUROSEMIDE 10 MG/ML IJ SOLN
20.0000 mg | INTRAMUSCULAR | Status: DC
  Administered 2020-03-27 – 2020-03-28 (×4): 20 mg via INTRAVENOUS
  Filled 2020-03-27 (×4): qty 4

## 2020-03-27 MED ORDER — ENOXAPARIN SODIUM 40 MG/0.4ML ~~LOC~~ SOLN
0.5000 mg/kg | SUBCUTANEOUS | Status: DC
  Administered 2020-03-27 – 2020-03-29 (×3): 40 mg via SUBCUTANEOUS
  Filled 2020-03-27 (×3): qty 0.4

## 2020-03-27 MED ORDER — SODIUM CHLORIDE 0.9 % IV SOLN: 3.0000 g | Freq: Once | INTRAVENOUS | Status: DC

## 2020-03-27 MED ORDER — TRAZODONE HCL 50 MG PO TABS: 25.0000 mg | ORAL_TABLET | Freq: Every evening | ORAL | Status: DC | PRN

## 2020-03-27 MED ORDER — ATORVASTATIN CALCIUM 20 MG PO TABS
40.0000 mg | ORAL_TABLET | Freq: Every day | ORAL | Status: DC
  Filled 2020-03-27: qty 2

## 2020-03-27 MED ORDER — ENOXAPARIN SODIUM 40 MG/0.4ML ~~LOC~~ SOLN: 40.0000 mg | SUBCUTANEOUS | Status: DC

## 2020-03-27 MED ORDER — METFORMIN HCL 500 MG PO TABS
1000.0000 mg | ORAL_TABLET | Freq: Every day | ORAL | Status: DC
  Administered 2020-03-28 – 2020-03-29 (×2): 1000 mg via ORAL
  Filled 2020-03-27 (×2): qty 2

## 2020-03-27 MED ORDER — LEVOTHYROXINE SODIUM 100 MCG PO TABS
100.0000 ug | ORAL_TABLET | Freq: Every day | ORAL | Status: DC
  Administered 2020-03-27 – 2020-03-29 (×2): 100 ug via ORAL
  Filled 2020-03-27: qty 1
  Filled 2020-03-27: qty 2

## 2020-03-27 MED ORDER — MAGNESIUM HYDROXIDE 400 MG/5ML PO SUSP: 30.0000 mL | Freq: Every day | ORAL | Status: DC | PRN

## 2020-03-27 NOTE — ED Notes (Signed)
Large urine voided not captured by purewik; pt cleaned, new brief placed and purewik repositioned

## 2020-03-27 NOTE — ED Notes (Signed)
Patient repositioned in bed.

## 2020-03-27 NOTE — ED Notes (Signed)
PT given a drink of water.

## 2020-03-27 NOTE — ED Provider Notes (Signed)
[facility] Emergency Department Provider Note  ____________________________________________  Time seen: Approximately 12:17 AM  I have reviewed the triage vital signs and the nursing notes.   HISTORY  Chief Complaint Respiratory Distress   HPI Kathryn [name] is a 87 y.o. female with a history of COPD, DM, peptic ulcer disease, hypertension, hyperlipidemia, arthritis who presents for evaluation of shortness of breath.  Patient reports that her symptoms started this evening.  She has had a day of congestion.  No fever, no chest pain, no changes in her chronic cough.  She is vaccinated against Covid and has not been exposed to anybody with it.  This evening she started having severe shortness of breath.  She was found hypoxic by EMS and transported on CPAP.  Blood pressure was extremely elevated on arrival with systolics in the [id number]. No history of CHF. She received 2 inches of Nitropaste and enalapril.  She also received 2 breathing treatments and 125 mg of Solu-Medrol.  Patient is fully vaccinated for Covid including booster shot and flu shot.  No vomiting or diarrhea, no loss of taste or smell, no abdominal pain.   Past Medical History:  Diagnosis Date  . Actinic keratosis   . Arthritis    spinal stenosis  . COPD (chronic obstructive pulmonary disease) ([place])   . Diabetes mellitus without complication ([place])   . Gastric ulcer 4/12   treated with Prolisec- states no problems now  . Hyperlipidemia   . Hypertension    PCP Dr Miguel Aschoff   [place]  . Hypothyroidism   . Neuromuscular disorder ([place])    slight numbness right toes- comes and goes  . Peripheral vascular disease (HCC)    varicose veins left leg  . Pneumonia   . Seasonal allergies   . Shortness of breath   . Skin cancer    multiple from face  . Squamous cell carcinoma of skin 03/23/2013   L dorsal hand  . Squamous cell carcinoma of skin 02/04/2014   R forearm/in situ, L pretibial  .  Squamous cell carcinoma of skin 09/24/2018   R thumb    Patient Active Problem List   Diagnosis Date Noted  . Acute CHF ([place]) 03/27/2020  . Diabetes mellitus type 2, uncomplicated ([place]) [id number]  . COPD, mild ([place]) 09/17/2014  . Clinical depression 09/17/2014  . Elevated platelet count 09/17/2014  . Elevated erythrocyte sedimentation rate 09/17/2014  . History of tobacco use 09/17/2014  . Hypercholesteremia 09/17/2014  . BP (high blood pressure) 09/17/2014  . Adult hypothyroidism 09/17/2014  . Cannot sleep 09/17/2014  . Lumbar disc disease with radiculopathy 09/17/2014  . Arthritis, degenerative 09/17/2014  . Excess weight 09/17/2014  . Vertebrobasilar circulation transient ischemic attack 09/17/2014  . Lung nodule, multiple 09/17/2014  . Basilar artery insufficiency 09/17/2014  . Neuritis or radiculitis due to rupture of lumbar intervertebral disc 08/25/2013  . Degenerative arthritis of lumbar spine 08/25/2013  . Lumbar canal stenosis 08/25/2013  . Smoker 07/15/2012  . Back pain 07/15/2012  . SOB (shortness of breath) 07/15/2012  . Hyperlipidemia 07/15/2012  . Essential hypertension 07/15/2012    Past Surgical History:  Procedure Laterality Date  . BACK SURGERY     4/12  Dr Shellia Carwin- for lumbar stenosis  . BREAST CYST ASPIRATION Left 1965   negative  . BREAST SURGERY  1962   lumpectomy with biopsy   left  . CARPAL TUNNEL RELEASE     right  . COLONOSCOPY    . COLONOSCOPY  WITH PROPOFOL N/A 06/30/2015   Procedure: COLONOSCOPY WITH PROPOFOL;  Surgeon: Wallace Cullens, MD;  Location[facility] ENDOSCOPY;  Service: Gastroenterology;  Laterality: N/A;  . COLONOSCOPY WITH PROPOFOL N/A 01/22/2017   Procedure: COLONOSCOPY WITH PROPOFOL;  Surgeon: Christena Deem, MD;  Location: [facility] ENDOSCOPY;  Service: Endoscopy;  Laterality: N/A;  . COLONOSCOPY WITH PROPOFOL N/A 01/28/2019   Procedure: COLONOSCOPY WITH PROPOFOL;  Surgeon: Toledo, Boykin Nearing, MD;  Location: [facility] ENDOSCOPY;  Service:  Gastroenterology;  Laterality: N/A;  . ESOPHAGOGASTRODUODENOSCOPY    . ESOPHAGOGASTRODUODENOSCOPY (EGD) WITH PROPOFOL N/A 01/28/2019   Procedure: ESOPHAGOGASTRODUODENOSCOPY (EGD) WITH PROPOFOL;  Surgeon: Toledo, Boykin Nearing, MD;  Location: [facility] ENDOSCOPY;  Service: Gastroenterology;  Laterality: N/A;  . EYE SURGERY     cataract extraction with IOL bilaterally  . JOINT REPLACEMENT    . LUMBAR LAMINECTOMY/DECOMPRESSION MICRODISCECTOMY  05/24/2011   Procedure: LUMBAR LAMINECTOMY/DECOMPRESSION MICRODISCECTOMY 2 LEVELS;  Surgeon: Drucilla Schmidt, MD;  Location: [facility] ORS;  Service: Orthopedics;  Laterality: N/A;  L2-L3, L3-L4 (x-ray)  . RIGHT OOPHORECTOMY     due to STAPH INFECTION    Prior to Admission medications   Medication Sig Start Date End Date Taking? Authorizing Provider  acetaminophen (TYLENOL) 500 MG tablet Take 1,000 mg by mouth every 6 (six) hours as needed. Pain     [provider]  albuterol (PROVENTIL) (2.5 MG/3ML) 0.083% nebulizer solution Take 3 mLs (2.5 mg total) by nebulization every 6 (six) hours as needed for wheezing or shortness of breath. 04/20/16   Anola Gurney, PA  atorvastatin (LIPITOR) 40 MG tablet TAKE 1 TABLET BY MOUTH EVERY DAY Patient not taking: Reported on 01/14/2020 11/05/19   Maple Hudson., MD  cetirizine (ZYRTEC) 10 MG tablet Take 10 mg by mouth at bedtime.  Patient not taking: Reported on 01/19/2020    [provider]  DULoxetine (CYMBALTA) 20 MG capsule TAKE 1 CAPSULE(20 MG) BY MOUTH DAILY 08/17/19   Maple Hudson., MD  esomeprazole (NEXIUM) 40 MG capsule Take 40 mg by mouth daily at 12 noon. Patient not taking: Reported on 01/19/2020    [provider]  FEROSUL 325 (65 Fe) MG tablet TAKE 1 TABLET(325 MG) BY MOUTH DAILY WITH BREAKFAST 04/16/19   Maple Hudson., MD  glucose blood (CONTOUR NEXT TEST) test strip Check sugar once daily  DX E11.9 11/16/16   Maple Hudson., MD  HYDROcodone-acetaminophen  (NORCO/VICODIN) 5-325 MG tablet Take 1 tablet by mouth every 6 (six) hours as needed for moderate pain. Patient not taking: Reported on 01/19/2020 01/22/18   Maple Hudson., MD  hydrOXYzine (ATARAX/VISTARIL) 10 MG tablet TAKE 1 TO 2 TABLETS(10 TO 20 MG) BY MOUTH EVERY 6 HOURS AS NEEDED 09/16/19   Maple Hudson., MD  levothyroxine (SYNTHROID) 100 MCG tablet TAKE 1 TABLET(100 MCG) BY MOUTH DAILY BEFORE BREAKFAST 08/17/19   Maple Hudson., MD  lisinopril (ZESTRIL) 20 MG tablet TAKE 1 TABLET BY MOUTH EVERY NIGHT AT BEDTIME FOR HIGH BLOOD PRESSURE Patient not taking: Reported on 01/14/2020 09/08/19   Chrismon, Jodell Cipro, PA-C  metFORMIN (GLUCOPHAGE) 1000 MG tablet TAKE 1 TABLET(1000 MG) BY MOUTH DAILY WITH SUPPER 03/03/20   Maple Hudson., MD  nitrofurantoin, macrocrystal-monohydrate, (MACROBID) 100 MG capsule Take 1 capsule (100 mg total) by mouth 2 (two) times daily. 01/19/20   Maple Hudson., MD  omeprazole (PRILOSEC) 20 MG capsule TAKE 1 CAPSULE(20 MG) BY MOUTH DAILY 03/03/20   Bosie Clos  Brooke Bonito., MD  tiotropium (SPIRIVA) 18 MCG inhalation capsule Place 18 mcg into inhaler and inhale daily.     [provider]    Allergies Oxycodone and Prednisone  Family History  Problem Relation Age of Onset  . Dementia Mother   . Lung cancer Father   . Heart disease Father   . Breast cancer Maternal Aunt   . Arthritis Son     Social History Social History   Tobacco Use  . Smoking status: Former Smoker    Packs/day: 0.50    Years: 50.00    Pack years: 25.00    Types: Cigarettes    Quit date: 05/16/2004    Years since quitting: 15.8  . Smokeless tobacco: Never Used  Substance Use Topics  . Alcohol use: Yes    Comment: socially  . Drug use: No    Review of Systems  Constitutional: Negative for fever. Eyes: Negative for visual changes. ENT: Negative for sore throat. + congestion Neck: No neck pain  Cardiovascular: Negative for chest  pain. Respiratory: + shortness of breath. Gastrointestinal: Negative for abdominal pain, vomiting or diarrhea. Genitourinary: Negative for dysuria. Musculoskeletal: Negative for back pain. Skin: Negative for rash. Neurological: Negative for headaches, weakness or numbness. Psych: No SI or HI  ____________________________________________   PHYSICAL EXAM:  VITAL SIGNS: ED Triage Vitals  Enc Vitals Group     BP 03/26/20 2324 (!) 178/86     Pulse Rate 03/26/20 2324 (!) 113     Resp 03/26/20 2326 19     Temp 03/26/20 2326 (!) 96.8 F (36 C)     Temp Source 03/26/20 2326 Axillary     SpO2 03/26/20 2324 97 %     Weight 03/26/20 2327 179 lb (81.2 kg)     Height 03/26/20 2327 5\' 4"  (1.626 m)     Head Circumference --      Peak Flow --      Pain Score --      Pain Loc --      Pain Edu? --      Excl. in [place]? --     Constitutional: Alert and oriented, moderate respiratory distress.  HEENT:      Head: Normocephalic and atraumatic.         Eyes: Conjunctivae are normal. Sclera is non-icteric.       Mouth/Throat: Mucous membranes are moist.       Neck: Supple with no signs of meningismus. Cardiovascular: Regular rate and rhythm. No murmurs, gallops, or rubs. 2+ symmetrical distal pulses are present in all extremities. No JVD. Respiratory: Moderate respiratory distress, retracting, severely diminished air movement with expiratory wheezes Gastrointestinal: Soft, non tender, and non distended. Musculoskeletal: 1+ pitting edema bilaterally Neurologic: Normal speech and language. Face is symmetric. Moving all extremities. No gross focal neurologic deficits are appreciated. Skin: Skin is warm, dry and intact. No rash noted. Psychiatric: Mood and affect are normal. Speech and behavior are normal.  ____________________________________________   LABS (all labs ordered are listed, but only abnormal results are displayed)  Labs Reviewed  CBC WITH DIFFERENTIAL/PLATELET - Abnormal; Notable  for the following components:      Result Value   Hemoglobin 11.1 (*)    MCH 25.3 (*)    MCHC 29.1 (*)    RDW 15.6 (*)    All other components within normal limits  COMPREHENSIVE METABOLIC PANEL - Abnormal; Notable for the following components:   Glucose, Bld 223 (*)    All other components within normal  limits  BLOOD GAS, VENOUS - Abnormal; Notable for the following components:   Acid-base deficit 4.1 (*)    All other components within normal limits  BRAIN NATRIURETIC PEPTIDE - Abnormal; Notable for the following components:   B Natriuretic Peptide 337.8 (*)    All other components within normal limits  LACTIC ACID, PLASMA - Abnormal; Notable for the following components:   Lactic Acid, Venous 3.5 (*)    All other components within normal limits  TROPONIN I (HIGH SENSITIVITY) - Abnormal; Notable for the following components:   Troponin I (High Sensitivity) 28 (*)    All other components within normal limits  TROPONIN I (HIGH SENSITIVITY) - Abnormal; Notable for the following components:   Troponin I (High Sensitivity) 31 (*)    All other components within normal limits  RESP PANEL BY RT-PCR (FLU A&B, COVID) [id number]  URINE CULTURE  PROCALCITONIN  LACTIC ACID, PLASMA  URINALYSIS, COMPLETE (UACMP) WITH MICROSCOPIC  BASIC METABOLIC PANEL  CBC   ____________________________________________  EKG  ED ECG REPORT I, Rudene Re, the attending physician, personally viewed and interpreted this ECG.  Sinus tachycardia, rate 112, borderline prolonged QTc, LBBB, no concordant STE. Unchanged from baseline ____________________________________________  RADIOLOGY  I have personally reviewed the images performed during this visit and I agree with the Radiologist's read.   Interpretation by Radiologist:  DG Chest Portable 1 View  Result Date: 03/26/2020 CLINICAL DATA:  Dyspnea EXAM: PORTABLE CHEST 1 VIEW COMPARISON:  03/24/2020 FINDINGS: Pulmonary insufflation has slightly  decreased, however, lung volumes are normal. There has developed, however, progressive patchy interstitial and airspace infiltrate predominantly within the lung bases bilaterally, infection versus aspiration. Tiny bilateral pleural effusions have developed. No pneumothorax. Cardiac size within normal limits. No acute bone abnormality. Degenerative changes are seen within the shoulders bilaterally. IMPRESSION: Progressive, asymmetric, predominantly bibasilar pulmonary infiltrate, likely related to infection or aspiration. Interval development of tiny bilateral pleural effusions. Electronically Signed   By: Fidela Salisbury MD   On: 03/26/2020 23:55     ____________________________________________   PROCEDURES  Procedure(s) performed:yes .1-3 Lead EKG Interpretation Performed by: Rudene Re, MD Authorized by: Rudene Re, MD     Interpretation: abnormal     ECG rate assessment: tachycardic     Rhythm: sinus tachycardia     Conduction: abnormal     Critical Care performed: yes  CRITICAL CARE Performed by: Rudene Re  ?  Total critical care time: 40 min  Critical care time was exclusive of separately billable procedures and treating other patients.  Critical care was necessary to treat or prevent imminent or life-threatening deterioration.  Critical care was time spent personally by me on the following activities: development of treatment plan with patient and/or surrogate as well as nursing, discussions with consultants, evaluation of patient's response to treatment, examination of patient, obtaining history from patient or surrogate, ordering and performing treatments and interventions, ordering and review of laboratory studies, ordering and review of radiographic studies, pulse oximetry and re-evaluation of patient's condition.  ____________________________________________   INITIAL IMPRESSION / ASSESSMENT AND PLAN / ED COURSE  87 y.o. female with a history  of COPD, DM, peptic ulcer disease, hypertension, hyperlipidemia, arthritis who presents for evaluation of shortness of breath.  Patient found hypoxic per EMS, hypertensive, and short of breath.  She was given 125 mg of Solu-Medrol, 2 breathing treatments, dose of IV enalapril and 2 inches of Nitropaste.  She arrives in moderate respiratory distress with retractions, severely diminished air movement bilaterally and faint wheezes.  Patient also has 1+ pitting edema which according to her and her son was at bedside this is markedly improved when compared to her baseline.  She has no history of CHF.  Patient was transitioned to BiPAP and started with 3 duo nebs.  Blood pressure improved with systolics in the Q000111Q.  She is afebrile.  She is vaccinated for Covid and flu and denies any known exposures.  She denies any fevers at home.  Chest x-ray with bilateral infiltrates concerning for edema versus infection, visualized by me confirmed by radiology.  Covid and flu are pending.  Procalcitonin is also pending.  Patient denies any aspiration events.  VBG showing normal PCO2 of 52 with a low pH of 7.26.  We will get a lactic acid.  BNP is elevated at 337.  No leukocytosis, stable mild anemia, mild hyperglycemia with no evidence of DKA.  Initial troponin is at baseline at 28.  EKG showing known left bundle branch block.  Patient placed on telemetry for close monitoring.  History gathered from 50 son who was at bedside, plan discussed with both of them.  Old medical records reviewed.  Dissipate admission.  _________________________ 2:38 AM on 03/27/2020 -----------------------------------------  At this time clinically believe the patient's presentation is due to new congestive heart failure.  Her procalcitonin is negative she has no fever, no white count arguing against an infectious etiology.  Her blood pressure dropped significantly in the emergency room after patient received nitro and enalapril per EMS.  Therefore  Lasix was held at this time.  She was given a small bolus of 500 cc of LR with improvement of her blood pressure.  She remains on BiPAP.  PCO2 normal VBG arguing against a COPD exacerbation.  Covid and flu negative.  Discussed with Dr. Audery Amel for admission for an echocardiogram    _____________________________________________ Please note:  Patient was evaluated in Emergency Department today for the symptoms described in the history of present illness. Patient was evaluated in the context of the global COVID-19 pandemic, which necessitated consideration that the patient might be at risk for infection with the SARS-CoV-2 virus that causes COVID-19. Institutional protocols and algorithms that pertain to the evaluation of patients at risk for COVID-19 are in a state of rapid change based on information released by regulatory bodies including the CDC and federal and state organizations. These policies and algorithms were followed during the patient's care in the ED.  Some ED evaluations and interventions may be delayed as a result of limited staffing during the pandemic.   [place] Controlled Substance Database was reviewed by me. ____________________________________________   FINAL CLINICAL IMPRESSION(S) / ED DIAGNOSES   Final diagnoses:  Acute respiratory failure with hypoxia ([place])      NEW MEDICATIONS STARTED DURING THIS VISIT:  ED Discharge Orders    None       Note:  This document was prepared using Dragon voice recognition software and may include unintentional dictation errors.    Rudene Re, MD 03/27/20 [phone]

## 2020-03-27 NOTE — ED Notes (Signed)
Pt trialed off bipap for administration of PO meds, pt noted to have increased dyspnea when Bipap removed, immediately decreased WOB when Bipap placed back on. Will hold PO meds at this time due to patient being unable to tolerate being off Bipap even for short periods of time.

## 2020-03-27 NOTE — ED Notes (Signed)
Pt visualized resting in bed playing on personal cell phone, NAD noted at this time, pt remains on 3L via [place] at this time. Pt denies any needs. Call bell remains within reach of patient.

## 2020-03-27 NOTE — ED Notes (Signed)
Nitro paste removed due to pt bp. Pt denying any symptoms. Alert and oriented x4. No needs at this time

## 2020-03-27 NOTE — Progress Notes (Signed)
PHARMACIST - PHYSICIAN COMMUNICATION  CONCERNING:  Enoxaparin (Lovenox) for DVT Prophylaxis    RECOMMENDATION: Patient was prescribed enoxaprin 40mg  q24 hours for VTE prophylaxis.   Filed Weights   03/26/20 2327  Weight: 81.2 kg (179 lb)    Body mass index is 30.73 kg/m.  Estimated Creatinine Clearance: 54 mL/min (by C-G formula based on SCr of 0.78 mg/dL).   Based on [facility] policy patient is candidate for enoxaparin 0.5mg /kg TBW SQ every 24 hours based on BMI being >30.  DESCRIPTION: Pharmacy has adjusted enoxaparin dose per [facility] policy.  Patient is now receiving enoxaparin 0.5 mg/kg every 24 hours   [facility], PharmD, [facility] 03/27/2020 2:35 AM

## 2020-03-27 NOTE — ED Notes (Signed)
This RN to bedside, introduced self to patient. Pt resting in bed comfortably on Bipap at this time, purewick placed on patient at this time, pt tolerated well. Call bell remains within reach of patient. Pt denies further needs.

## 2020-03-27 NOTE — ED Notes (Signed)
Pt placed on 3L via [place] in attempt to wean off of O2 by RT. Pt understands to use call bell for increased work of breathing decreased O2 sats. Pt alert and oriented, pt's family member at bedside at this time.

## 2020-03-27 NOTE — ED Notes (Signed)
Pt resting in bed with Bipap in place. Medication administered per order. Pt denies further needs at this time. Lights dimmed for patient comfort. Call bell within reach of patient at this time.

## 2020-03-27 NOTE — ED Notes (Signed)
Messaged Md about pt lactate.

## 2020-03-27 NOTE — Progress Notes (Signed)
PT Cancellation Note  Patient Details Name: AMBRIA MAYFIELD MRN: [id number] DOB: 12-13-1933   Cancelled Treatment:    Reason Eval/Treat Not Completed: Other (comment). Per chart, pt on and off bipap today, PT to attempt PT evaluation when pt respiratory status improved and pt more appropriate/can tolerate exertional activity.   Olga Coaster PT, DPT 2:41 PM,03/27/20

## 2020-03-27 NOTE — H&P (Signed)
[place]   PATIENT NAME: Kathryn [name]    MR#:  [id number]  DATE OF BIRTH:  1933/04/27  DATE OF ADMISSION:  03/26/2020  PRIMARY CARE PHYSICIAN: Maple Hudson., MD   REQUESTING/REFERRING PHYSICIAN: Nita Sickle, MD  CHIEF COMPLAINT:   Chief Complaint  Patient presents with  . Respiratory Distress    HISTORY OF PRESENT ILLNESS:  Kathryn [name]  is a 87 y.o. Caucasian female with a known history of multiple medical problems that are mentioned below, presented to the emergency room with acute onset of worsening dyspnea since Monday with associated orthopnea without cough or wheezing.  She has been having dyspnea on exertion and denies any worsening lower extremity edema or paroxysmal nocturnal dyspnea.  No nausea or vomiting or abdominal pain.  No chest pain or palpitations.  Upon presentation to the ER, blood pressure was 178/86 and heart rate 113 with a pulse ox 90 of 97% on CPAP that the patient had by EMS upon arrival to the ER.  She was seen by EMS she was significantly hypertensive with a systolic BP of 234 which was given 1.25 mg of IV enalapril and 1 inch of Nitropaste.  Her blood pressure was down to 89/54.  He was given a bolus of IV normal saline with improvement of blood pressure.  Chest x-ray showed progressive asymmetric predominantly bibasal pulmonary infiltrates likely related to infection or aspiration with interval development of tiny bilateral pleural effusions. Labs revealed VBG with pH 7.26 and HCO3 23.3 number glucose 223 high-sensitivity troponin I of 28 and later 31.  Lactic acid was 3.5.  Procalcitonin was less than 0.1 though.  BNP was elevated 337 and CBC showed mild anemia.  Influenza antigens and COVID-19 PCR came back negative.  The patient was given 3 DuoNeb's of 500 mill IV lactated Ringer bolus patient will be admitted to a progressive unit bed for further evaluation and management PAST MEDICAL HISTORY:   Past Medical History:   Diagnosis Date  . Actinic keratosis   . Arthritis    spinal stenosis  . COPD (chronic obstructive pulmonary disease) (HCC)   . Diabetes mellitus without complication (HCC)   . Gastric ulcer 4/12   treated with Prolisec- states no problems now  . Hyperlipidemia   . Hypertension    PCP Dr Julieanne Manson   [place]  . Hypothyroidism   . Neuromuscular disorder (HCC)    slight numbness right toes- comes and goes  . Peripheral vascular disease (HCC)    varicose veins left leg  . Pneumonia   . Seasonal allergies   . Shortness of breath   . Skin cancer    multiple from face  . Squamous cell carcinoma of skin 03/23/2013   L dorsal hand  . Squamous cell carcinoma of skin 02/04/2014   R forearm/in situ, L pretibial  . Squamous cell carcinoma of skin 09/24/2018   R thumb    PAST SURGICAL HISTORY:   Past Surgical History:  Procedure Laterality Date  . BACK SURGERY     4/12  Dr Simonne Come- for lumbar stenosis  . BREAST CYST ASPIRATION Left 1965   negative  . BREAST SURGERY  1962   lumpectomy with biopsy   left  . CARPAL TUNNEL RELEASE     right  . COLONOSCOPY    . COLONOSCOPY WITH PROPOFOL N/A 06/30/2015   Procedure: COLONOSCOPY WITH PROPOFOL;  Surgeon: Wallace Cullens, MD;  Location: [facility] ENDOSCOPY;  Service: Gastroenterology;  Laterality: N/A;  .  COLONOSCOPY WITH PROPOFOL N/A 01/22/2017   Procedure: COLONOSCOPY WITH PROPOFOL;  Surgeon: Lollie Sails, MD;  Location: [facility] ENDOSCOPY;  Service: Endoscopy;  Laterality: N/A;  . COLONOSCOPY WITH PROPOFOL N/A 01/28/2019   Procedure: COLONOSCOPY WITH PROPOFOL;  Surgeon: Toledo, Benay Pike, MD;  Location: [facility] ENDOSCOPY;  Service: Gastroenterology;  Laterality: N/A;  . ESOPHAGOGASTRODUODENOSCOPY    . ESOPHAGOGASTRODUODENOSCOPY (EGD) WITH PROPOFOL N/A 01/28/2019   Procedure: ESOPHAGOGASTRODUODENOSCOPY (EGD) WITH PROPOFOL;  Surgeon: Toledo, Benay Pike, MD;  Location: [facility] ENDOSCOPY;  Service: Gastroenterology;  Laterality: N/A;  . EYE SURGERY      cataract extraction with IOL bilaterally  . JOINT REPLACEMENT    . LUMBAR LAMINECTOMY/DECOMPRESSION MICRODISCECTOMY  05/24/2011   Procedure: LUMBAR LAMINECTOMY/DECOMPRESSION MICRODISCECTOMY 2 LEVELS;  Surgeon: Magnus Sinning, MD;  Location: [facility] ORS;  Service: Orthopedics;  Laterality: N/A;  L2-L3, L3-L4 (x-ray)  . RIGHT OOPHORECTOMY     due to STAPH INFECTION    SOCIAL HISTORY:   Social History   Tobacco Use  . Smoking status: Former Smoker    Packs/day: 0.50    Years: 50.00    Pack years: 25.00    Types: Cigarettes    Quit date: 05/16/2004    Years since quitting: 15.8  . Smokeless tobacco: Never Used  Substance Use Topics  . Alcohol use: Yes    Comment: socially    FAMILY HISTORY:   Family History  Problem Relation Age of Onset  . Dementia Mother   . Lung cancer Father   . Heart disease Father   . Breast cancer Maternal Aunt   . Arthritis Son     DRUG ALLERGIES:   Allergies  Allergen Reactions  . Oxycodone Other (See Comments)    Mental status change  . Prednisone     swelling, bruising.    REVIEW OF SYSTEMS:   ROS As per history of present illness. All pertinent systems were reviewed above. Constitutional, HEENT, cardiovascular, respiratory, GI, GU, musculoskeletal, neuro, psychiatric, endocrine, integumentary and hematologic systems were reviewed and are otherwise negative/unremarkable except for positive findings mentioned above in the HPI.   MEDICATIONS AT HOME:   Prior to Admission medications   Medication Sig Start Date End Date Taking? Authorizing Provider  acetaminophen (TYLENOL) 500 MG tablet Take 1,000 mg by mouth every 6 (six) hours as needed. Pain     [provider]  albuterol (PROVENTIL) (2.5 MG/3ML) 0.083% nebulizer solution Take 3 mLs (2.5 mg total) by nebulization every 6 (six) hours as needed for wheezing or shortness of breath. 04/20/16   Carmon Ginsberg, PA  atorvastatin (LIPITOR) 40 MG tablet TAKE 1 TABLET BY MOUTH EVERY  DAY Patient not taking: Reported on 01/14/2020 11/05/19   Jerrol Banana., MD  cetirizine (ZYRTEC) 10 MG tablet Take 10 mg by mouth at bedtime.  Patient not taking: Reported on 01/19/2020    [provider]  DULoxetine (CYMBALTA) 20 MG capsule TAKE 1 CAPSULE(20 MG) BY MOUTH DAILY 08/17/19   Jerrol Banana., MD  esomeprazole (NEXIUM) 40 MG capsule Take 40 mg by mouth daily at 12 noon. Patient not taking: Reported on 01/19/2020    [provider]  FEROSUL 325 (65 Fe) MG tablet TAKE 1 TABLET(325 MG) BY MOUTH DAILY WITH BREAKFAST 04/16/19   Jerrol Banana., MD  glucose blood (CONTOUR NEXT TEST) test strip Check sugar once daily  DX E11.9 11/16/16   Jerrol Banana., MD  HYDROcodone-acetaminophen (NORCO/VICODIN) 5-325 MG tablet Take 1 tablet by mouth every  6 (six) hours as needed for moderate pain. Patient not taking: Reported on 01/19/2020 01/22/18   Jerrol Banana., MD  hydrOXYzine (ATARAX/VISTARIL) 10 MG tablet TAKE 1 TO 2 TABLETS(10 TO 20 MG) BY MOUTH EVERY 6 HOURS AS NEEDED 09/16/19   Jerrol Banana., MD  levothyroxine (SYNTHROID) 100 MCG tablet TAKE 1 TABLET(100 MCG) BY MOUTH DAILY BEFORE BREAKFAST 08/17/19   Jerrol Banana., MD  lisinopril (ZESTRIL) 20 MG tablet TAKE 1 TABLET BY MOUTH EVERY NIGHT AT BEDTIME FOR HIGH BLOOD PRESSURE Patient not taking: Reported on 01/14/2020 09/08/19   Chrismon, Vickki Muff, PA-C  metFORMIN (GLUCOPHAGE) 1000 MG tablet TAKE 1 TABLET(1000 MG) BY MOUTH DAILY WITH SUPPER 03/03/20   Jerrol Banana., MD  nitrofurantoin, macrocrystal-monohydrate, (MACROBID) 100 MG capsule Take 1 capsule (100 mg total) by mouth 2 (two) times daily. 01/19/20   Jerrol Banana., MD  omeprazole (PRILOSEC) 20 MG capsule TAKE 1 CAPSULE(20 MG) BY MOUTH DAILY 03/03/20   Jerrol Banana., MD  tiotropium [facility]) 18 MCG inhalation capsule Place 18 mcg into inhaler and inhale daily.     [provider]      VITAL  SIGNS:  Blood pressure (!) 81/52, pulse (!) 105, temperature (!) 96.8 F (36 C), temperature source Axillary, resp. rate 15, height 5\' 4"  (1.626 m), weight 81.2 kg, SpO2 95 %.  PHYSICAL EXAMINATION:  Physical Exam  GENERAL:  87 y.o.-year-old patient lying in the bed with no significant distress on BiPAP. EYES: Pupils equal, round, reactive to light and accommodation. No scleral icterus. Extraocular muscles intact.  HEENT: Head atraumatic, normocephalic. Oropharynx and nasopharynx clear.  NECK:  Supple, no jugular venous distention. No thyroid enlargement, no tenderness.  LUNGS: Diminished bibasal breath sounds with bibasal rales.   CARDIOVASCULAR: Regular rate and rhythm, S1, S2 normal. No murmurs, rubs, or gallops.  ABDOMEN: Soft, nondistended, nontender. Bowel sounds present. No organomegaly or mass.  EXTREMITIES: Trace bilateral lower extremity pitting edema with no cyanosis, or clubbing.  NEUROLOGIC: Cranial nerves II through XII are intact. Muscle strength 5/5 in all extremities. Sensation intact. Gait not checked.  PSYCHIATRIC: The patient is alert and oriented x 3.  Normal affect and good eye contact. SKIN: No obvious rash, lesion, or ulcer.   LABORATORY PANEL:   CBC Recent Labs  Lab 03/26/20 2332  WBC 8.1  HGB 11.1*  HCT 38.2  PLT 273   ------------------------------------------------------------------------------------------------------------------  Chemistries  Recent Labs  Lab 03/26/20 2332  NA 138  K 3.6  CL 105  CO2 22  GLUCOSE 223*  BUN 18  CREATININE 0.78  CALCIUM 9.5  AST 21  ALT 15  ALKPHOS 70  BILITOT 0.6   ------------------------------------------------------------------------------------------------------------------  Cardiac Enzymes No results for input(s): TROPONINI in the last 168 hours. ------------------------------------------------------------------------------------------------------------------  RADIOLOGY:  DG Chest Portable 1  View  Result Date: 03/26/2020 CLINICAL DATA:  Dyspnea EXAM: PORTABLE CHEST 1 VIEW COMPARISON:  03/24/2020 FINDINGS: Pulmonary insufflation has slightly decreased, however, lung volumes are normal. There has developed, however, progressive patchy interstitial and airspace infiltrate predominantly within the lung bases bilaterally, infection versus aspiration. Tiny bilateral pleural effusions have developed. No pneumothorax. Cardiac size within normal limits. No acute bone abnormality. Degenerative changes are seen within the shoulders bilaterally. IMPRESSION: Progressive, asymmetric, predominantly bibasilar pulmonary infiltrate, likely related to infection or aspiration. Interval development of tiny bilateral pleural effusions. Electronically Signed   By: Fidela Salisbury MD   On: 03/26/2020 23:55      IMPRESSION  AND PLAN:  1.  Acute new onset CHF, likely diastolic. -The patient will be admitted to a progressive unit bed. -She will be diuresed with IV Lasix. -We will continue BiPAP as tolerated and taper it off as needed. -We will obtain a 2D echo and a cardiology consult in a.m. -I notified Dr. Mariah Milling about the patient.  2.  Hypertensive urgency. -This could be the culprit for #1. -We will continue her antihypertensives. -We will place the patient on as needed IV labetalol.  3.  Hypothyroidism. -We will continue her Synthroid and check TSH level.  4.  Type II diabetes mellitus. -We will hold off Glucophage and place the patient on supplement coverage with NovoLog.  5.  Dyslipidemia. -We will continue statin therapy.  6.  GERD. -We will continue PPI therapy.  7.  COPD. -We will continue Spiriva as well as as needed albuterol nebulizer.  8.  DVT prophylaxis. -Subcutaneous Lovenox.  All the records are reviewed and case discussed with ED provider. The plan of care was discussed in details with the patient (and family). I answered all questions. The patient agreed to proceed with the  above mentioned plan. Further management will depend upon hospital course.   CODE STATUS: This was discussed with the patient and she desires to be DNR/DNI.  Status is: Inpatient  Remains inpatient appropriate because:Hemodynamically unstable, Ongoing diagnostic testing needed not appropriate for outpatient work up, Unsafe d/c plan, IV treatments appropriate due to intensity of illness or inability to take PO and Inpatient level of care appropriate due to severity of illness   Dispo: The patient is from: Home              Anticipated d/c is to: Home              Anticipated d/c date is: 3 days              Patient currently is not medically stable to d/c.   TOTAL TIME TAKING CARE OF THIS PATIENT: 55 minutes.    Hannah Beat M.D on 03/27/2020 at 1:32 AM  [facility]   From 7 PM-7 AM, contact night-coverage [email or web address]  CC: Primary care physician; Maple Hudson., MD

## 2020-03-27 NOTE — Consult Note (Signed)
Cardiology Consultation:   Patient ID: Kathryn [name] MRN: [id number]; DOB: October 30, 1933  Admit date: 03/26/2020 Date of Consult: 03/27/2020  Primary Care Provider: Jerrol Banana., MD [facility] Cardiologist: New to [place] Physician requesting consult: Dr. Sidney Ace Reason for consult: CHF/flash pulmonary edema, respiratory distress   Patient Profile:   Kathryn [name] is a 87 y.o. female with a hx of severe COPD, prior smoking history 50 years, medication noncompliance, presenting with several days worsening shortness of breath, leg edema, acute worsening leading to presentation to the emergency room   History of Present Illness:   [name] reports symptoms started approximately 1 week ago though leg edema started several weeks ago She reports that she talked with Dr. Rosanna Randy, she does not have diuretic at home She does not use her nebulizers, or her inhalers, they are in a box as she recently moved  Son brought her to the emergency room given worsening shortness of breath symptoms and chest squeezing  In the emergency room placed on BiPAP, given IV Lasix Given DuoNeb, steroids, Nitropaste  She reports good urine output, at least 1 L Last dose of Lasix 7:00 this morning   Past Medical History:  Diagnosis Date  . Actinic keratosis   . Arthritis    spinal stenosis  . COPD (chronic obstructive pulmonary disease) ([place])   . Diabetes mellitus without complication ([place])   . Gastric ulcer 4/12   treated with Prolisec- states no problems now  . Hyperlipidemia   . Hypertension    PCP Dr Miguel Aschoff   [place]  . Hypothyroidism   . Neuromuscular disorder ([place])    slight numbness right toes- comes and goes  . Peripheral vascular disease ([facility])    varicose veins left leg  . Pneumonia   . Seasonal allergies   . Shortness of breath   . Skin cancer    multiple from face  . Squamous cell carcinoma of skin 03/23/2013   L dorsal hand  . Squamous cell carcinoma of skin  02/04/2014   R forearm/in situ, L pretibial  . Squamous cell carcinoma of skin 09/24/2018   R thumb    Past Surgical History:  Procedure Laterality Date  . BACK SURGERY     4/12  Dr Shellia Carwin- for lumbar stenosis  . BREAST CYST ASPIRATION Left 1965   negative  . BREAST SURGERY  1962   lumpectomy with biopsy   left  . CARPAL TUNNEL RELEASE     right  . COLONOSCOPY    . COLONOSCOPY WITH PROPOFOL N/A 06/30/2015   Procedure: COLONOSCOPY WITH PROPOFOL;  Surgeon: Hulen Luster, MD;  [facility] END[facility]COPY;  Service: Gastroenterology;  Laterality: N/A;  . COLONOSCOPY WITH PROPOFOL N/A 01/22/2017   Procedure: COLONOSCOPY WITH PROPOFOL;  Surgeon: Lollie Sails, MD;  Location: [facility] ENDOSCOPY;  Service: Endoscopy;  Laterality: N/A;  . COLONOSCOPY WITH PROPOFOL N/A 01/28/2019   Procedure: COLONOSCOPY WITH PROPOFOL;  Surgeon: Toledo, Benay Pike, MD;  Location: [facility] ENDOSCOPY;  Service: Gastroenterology;  Laterality: N/A;  . ESOPHAGOGASTRODUODENOSCOPY    . ESOPHAGOGASTRODUODENOSCOPY (EGD) WITH PROPOFOL N/A 01/28/2019   Procedure: ESOPHAGOGASTRODUODENOSCOPY (EGD) WITH PROPOFOL;  Surgeon: Toledo, Benay Pike, MD;  Location: [facility] ENDOSCOPY;  Service: Gastroenterology;  Laterality: N/A;  . EYE SURGERY     cataract extraction with IOL bilaterally  . JOINT REPLACEMENT    . LUMBAR LAMINECTOMY/DECOMPRESSION MICRODISCECTOMY  05/24/2011   Procedure: LUMBAR LAMINECTOMY/DECOMPRESSION MICRODISCECTOMY 2 LEVELS;  Surgeon: Magnus Sinning, MD;  Location: [facility] ORS;  Service: Orthopedics;  Laterality: N/A;  L2-L3, L3-L4 (x-ray)  . RIGHT OOPHORECTOMY     due to STAPH INFECTION     Home Medications:  Prior to Admission medications   Medication Sig Start Date End Date Taking? Authorizing Provider  acetaminophen (TYLENOL) 500 MG tablet Take 1,000 mg by mouth every 6 (six) hours as needed. Pain    Yes [provider]  albuterol (PROVENTIL) (2.5 MG/3ML) 0.083% nebulizer solution Take 3 mLs (2.5 mg total) by  nebulization every 6 (six) hours as needed for wheezing or shortness of breath. 04/20/16  Yes Anola Gurney, PA  hydrOXYzine (ATARAX/VISTARIL) 10 MG tablet TAKE 1 TO 2 TABLETS(10 TO 20 MG) BY MOUTH EVERY 6 HOURS AS NEEDED 09/16/19  Yes Maple Hudson., MD  levothyroxine (SYNTHROID) 100 MCG tablet TAKE 1 TABLET(100 MCG) BY MOUTH DAILY BEFORE BREAKFAST 08/17/19  Yes Maple Hudson., MD  metFORMIN (GLUCOPHAGE) 1000 MG tablet TAKE 1 TABLET(1000 MG) BY MOUTH DAILY WITH SUPPER 03/03/20  Yes Maple Hudson., MD  omeprazole (PRILOSEC) 20 MG capsule TAKE 1 CAPSULE(20 MG) BY MOUTH DAILY 03/03/20  Yes Maple Hudson., MD  tiotropium (SPIRIVA) 18 MCG inhalation capsule Place 18 mcg into inhaler and inhale daily as needed.   Yes [provider]  atorvastatin (LIPITOR) 40 MG tablet TAKE 1 TABLET BY MOUTH EVERY DAY Patient not taking: No sig reported 11/05/19   Maple Hudson., MD  cetirizine (ZYRTEC) 10 MG tablet Take 10 mg by mouth at bedtime.  Patient not taking: No sig reported    [provider]  DULoxetine (CYMBALTA) 20 MG capsule TAKE 1 CAPSULE(20 MG) BY MOUTH DAILY Patient not taking: No sig reported 08/17/19   Maple Hudson., MD  FEROSUL 325 (65 Fe) MG tablet TAKE 1 TABLET(325 MG) BY MOUTH DAILY WITH BREAKFAST Patient not taking: No sig reported 04/16/19   Maple Hudson., MD  glucose blood (CONTOUR NEXT TEST) test strip Check sugar once daily  DX E11.9 11/16/16   Maple Hudson., MD    Inpatient Medications: Scheduled Meds: . enoxaparin (LOVENOX) injection  0.5 mg/kg Subcutaneous Q24H  . furosemide  20 mg Intravenous Q12H  . insulin aspart  0-9 Units Subcutaneous TID AC & HS  . levothyroxine  100 mcg Oral Q0600  . [START ON 03/28/2020] metFORMIN  1,000 mg Oral Q breakfast  . pantoprazole  40 mg Oral Daily  . tiotropium  18 mcg Inhalation Daily   Continuous Infusions:  PRN Meds: acetaminophen **OR** acetaminophen, albuterol,  labetalol, magnesium hydroxide, ondansetron **OR** ondansetron (ZOFRAN) IV, traZODone  Allergies:    Allergies  Allergen Reactions  . Oxycodone Other (See Comments)    Mental status change  . Prednisone     swelling, bruising.    Social History:   Social History   Socioeconomic History  . Marital status: Widowed    Spouse name: Not on file  . Number of children: Not on file  . Years of education: Not on file  . Highest education level: Not on file  Occupational History  . Not on file  Tobacco Use  . Smoking status: Former Smoker    Packs/day: 0.50    Years: 50.00    Pack years: 25.00    Types: Cigarettes    Quit date: 05/16/2004    Years since quitting: 15.8  . Smokeless tobacco: Never Used  Substance and Sexual Activity  . Alcohol use: Yes    Comment: socially  .  Drug use: No  . Sexual activity: Yes    Birth control/protection: None  Other Topics Concern  . Not on file  Social History Narrative  . Not on file   Social Determinants of Health   Financial Resource Strain: Not on file  Food Insecurity: Not on file  Transportation Needs: Not on file  Physical Activity: Not on file  Stress: Not on file  Social Connections: Not on file  Intimate Partner Violence: Not on file    Family History:    Family History  Problem Relation Age of Onset  . Dementia Mother   . Lung cancer Father   . Heart disease Father   . Breast cancer Maternal Aunt   . Arthritis Son      ROS:  Please see the history of present illness.  Review of Systems  Constitutional: Negative.   HENT: Negative.   Respiratory: Positive for shortness of breath.   Cardiovascular: Negative.   Gastrointestinal: Negative.   Musculoskeletal: Negative.   Neurological: Negative.   Psychiatric/Behavioral: Negative.   All other systems reviewed and are negative.    Physical Exam/Data:   Vitals:   03/27/20 1113 03/27/20 1330 03/27/20 1400 03/27/20 1430  BP:  (!) 121/53 103/75 133/64  Pulse:   84 85 84  Resp:  18 (!) 21 17  Temp:      TempSrc:      SpO2: 97% 99% 99% 99%  Weight:      Height:        Intake/Output Summary (Last 24 hours) at 03/27/2020 1555 Last data filed at 03/27/2020 1115 Gross per 24 hour  Intake --  Output 700 ml  Net -700 ml   Last 3 Weights 03/26/2020 03/24/2020 01/19/2020  Weight (lbs) 179 lb 179 lb 179 lb  Weight (kg) 81.194 kg 81.194 kg 81.194 kg     Body mass index is 30.73 kg/m.  General:  Well nourished, well developed, in no acute distress HEENT: normal Lymph: no adenopathy Neck: no JVD Endocrine:  No thryomegaly Vascular: No carotid bruits; FA pulses 2+ bilaterally without bruits  Cardiac:  normal S1, S2; RRR; no murmur  Lungs:  clear to auscultation bilaterally, no wheezing, rhonchi or rales  Abd: soft, nontender, no hepatomegaly  Ext: no edema Musculoskeletal:  No deformities, BUE and BLE strength normal and equal Skin: warm and dry  Neuro:  CNs 2-12 intact, no focal abnormalities noted Psych:  Normal affect   EKG:  The EKG was personally reviewed and demonstrates:   Normal sinus rhythm rate 97 bpm intraventricular conduction delay/borderline left bundle branch block  Telemetry:  Telemetry was personally reviewed and demonstrates:   Normal sinus rhythm  Relevant CV Studies: Echocardiogram pending   Laboratory Data:  High Sensitivity Troponin:   Recent Labs  Lab 03/24/20 0948 03/24/20 1211 03/26/20 2332 03/27/20 0139  TROPONINIHS 45* 45* 28* 31*     Chemistry Recent Labs  Lab 03/24/20 0948 03/26/20 2332 03/27/20 0435  NA 139 138 139  K 4.6 3.6 4.5  CL 105 105 106  CO2 24 22 19*  GLUCOSE 163* 223* 237*  BUN 14 18 19   CREATININE 0.71 0.78 0.83  CALCIUM 10.0 9.5 9.0  GFRNONAA >60 >60 >60  ANIONGAP 10 11 14     Recent Labs  Lab 03/24/20 0948 03/26/20 2332  PROT 6.8 7.4  ALBUMIN 3.6 3.8  AST 18 21  ALT 15 15  ALKPHOS 58 70  BILITOT 0.9 0.6   Hematology Recent Labs  Lab 03/24/20  [id number] 03/26/20 2332  03/27/20 0435  WBC 5.9 8.1 7.7  RBC 4.44 4.39 3.90  HGB 11.3* 11.1* 10.0*  HCT 37.5 38.2 33.6*  MCV 84.5 87.0 86.2  MCH 25.5* 25.3* 25.6*  MCHC 30.1 29.1* 29.8*  RDW 15.6* 15.6* 15.7*  PLT 270 273 208   BNP Recent Labs  Lab 03/26/20 2329  BNP 337.8*    DDimer No results for input(s): DDIMER in the last 168 hours.   Radiology/Studies:  DG Chest 2 View  Result Date: 03/24/2020 CLINICAL DATA:  Shortness of breath. EXAM: CHEST - 2 VIEW COMPARISON:  October 22, 2012. FINDINGS: The heart size and mediastinal contours are within normal limits. No pneumothorax is noted. Mild interstitial densities are noted in both lung bases with small pleural effusions consistent with pulmonary edema. The visualized skeletal structures are unremarkable. IMPRESSION: Mild bibasilar pulmonary edema with small pleural effusions. Aortic Atherosclerosis (ICD10-I70.0). Electronically Signed   By: Marijo Conception M.D.   On: 03/24/2020 10:18   DG Chest Portable 1 View  Result Date: 03/26/2020 CLINICAL DATA:  Dyspnea EXAM: PORTABLE CHEST 1 VIEW COMPARISON:  03/24/2020 FINDINGS: Pulmonary insufflation has slightly decreased, however, lung volumes are normal. There has developed, however, progressive patchy interstitial and airspace infiltrate predominantly within the lung bases bilaterally, infection versus aspiration. Tiny bilateral pleural effusions have developed. No pneumothorax. Cardiac size within normal limits. No acute bone abnormality. Degenerative changes are seen within the shoulders bilaterally. IMPRESSION: Progressive, asymmetric, predominantly bibasilar pulmonary infiltrate, likely related to infection or aspiration. Interval development of tiny bilateral pleural effusions. Electronically Signed   By: Fidela Salisbury MD   On: 03/26/2020 23:55   US ABDOMEN LIMITED RUQ (LIVER/GB)  Result Date: 03/24/2020 CLINICAL DATA:  Upper abdominal and chest pain for 1 week EXAM: ULTRASOUND ABDOMEN LIMITED RIGHT UPPER  QUADRANT COMPARISON:  None. FINDINGS: Gallbladder: Gallbladder is distended with no evidence of cholelithiasis or cholecystitis. Gallbladder measures 11.5 x 4.4 x 6.0 cm. Common bile duct: Diameter: 7 mm Liver: There is diffuse increased liver echotexture consistent with hepatic steatosis. No focal liver abnormality. No biliary dilation. Portal vein is patent on color Doppler imaging with normal direction of blood flow towards the liver. Other: Trace right pleural effusion incidentally noted. IMPRESSION: 1. Distended gallbladder without cholelithiasis or cholecystitis. 2. Hepatic steatosis. 3. Trace right pleural effusion. Electronically Signed   By: Randa Ngo M.D.   On: 03/24/2020 16:05     Assessment and Plan:   Acute on chronic respiratory distress Secondary to pulmonary edema in the setting of severe COPD She reports worsening ankle swelling, abdominal distention over the past several days to weeks. -Moderate improvement in her symptoms so far with IV Lasix -Echocardiogram pending -Would continue IV Lasix 3 times daily with close monitoring of renal function -Suspect will be able to wean off BiPAP later today to nasal cannula oxygen  COPD Started on steroids, nebulizers She does not use her nebulizers and inhalers at home, they are in a box after recently moving -Long smoking history 50 years, stopped over 10 years ago -Prior history of medication noncompliance Takes medications when she " feels like it"  Hypertensive urgency In the setting of flash pulmonary edema Blood pressure improved with Nitropaste, diuresis Blood pressure typically runs in the 140 range on recent visits with primary care -Further medication titration will likely be needed It does not appear that she is on any blood pressure medications as an outpatient -We will start low-dose losartan 25 mg daily  Diabetes type  2 Controlled by primary care    Total encounter time more than 110 minutes  Greater than  50% was spent in counseling and coordination of care with the patient   For questions or updates, please contact [place] Please consult [email or web address] for contact info under    Signed, Ida Rogue, MD  03/27/2020 3:55 PM

## 2020-03-27 NOTE — Progress Notes (Signed)
[place] [facility] [place] NAME: Kathryn [name]    MR#:  [id number]  [place]:  [birth year]  SUBJECTIVE:  patient came in with increasing shortness of breath for couple days. She also noticed some lower extremity edema. Has not been on blood pressure meds since has been taken off by her primary care. Denies history of CHF in the past  patient was placed on BiPAP now able to wean off. She is mild shortness of breath however able to complete sentence without respiratory distress. Sats are 97% on 3 L during my evaluation.  No chest pain. Sisiter in the ER room.  REVIEW OF SYSTEMS:   Review of Systems  Constitutional: Negative for chills, fever and weight loss.  HENT: Negative for ear discharge, ear pain and nosebleeds.   Eyes: Negative for blurred vision, pain and discharge.  Respiratory: Positive for shortness of breath. Negative for sputum production, wheezing and stridor.   Cardiovascular: Positive for leg swelling. Negative for chest pain, palpitations, orthopnea and PND.  Gastrointestinal: Negative for abdominal pain, diarrhea, nausea and vomiting.  Genitourinary: Negative for frequency and urgency.  Musculoskeletal: Negative for back pain and joint pain.  Neurological: Positive for weakness. Negative for sensory change, speech change and focal weakness.  Psychiatric/Behavioral: Negative for depression and hallucinations. The patient is not nervous/anxious.     Tolerating Diet: Tolerating PT:   DRUG ALLERGIES:   Allergies  Allergen Reactions  . Oxycodone Other (See Comments)    Mental status change  . Prednisone     swelling, bruising.    VITALS:  Blood pressure 120/62, pulse 92, temperature (!) 96.8 F (36 C), temperature source Axillary, resp. rate 16, height 5\' 4"  (1.626 m), weight 81.2 kg, SpO2 97 %.  PHYSICAL EXAMINATION:   Physical Exam  GENERAL:  87 y.o.-year-old patient lying in the bed with no acute distress.  HEENT:  Head atraumatic, normocephalic. Oropharynx and nasopharynx clear.  LUNGS: Normal breath sounds bilaterally, no wheezing, rales, rhonchi. No use of accessory muscles of respiration.  CARDIOVASCULAR: S1, S2 normal. No murmurs, rubs, or gallops.  ABDOMEN: Soft, nontender, nondistended. Bowel sounds present. No organomegaly or mass.  EXTREMITIES: 1+ edema b/l.    NEUROLOGIC: Cranial nerves II through XII are intact. No focal Motor or sensory deficits b/l.   PSYCHIATRIC:  patient is alert and oriented x 3.  SKIN: No obvious rash, lesion, or ulcer.   LABORATORY PANEL:  CBC Recent Labs  Lab 03/27/20 0435  WBC 7.7  HGB 10.0*  HCT 33.6*  PLT 208    Chemistries  Recent Labs  Lab 03/26/20 2332 03/27/20 0435  NA 138 139  K 3.6 4.5  CL 105 106  CO2 22 19*  GLUCOSE 223* 237*  BUN 18 19  CREATININE 0.78 0.83  CALCIUM 9.5 9.0  AST 21  --   ALT 15  --   ALKPHOS 70  --   BILITOT 0.6  --    Cardiac Enzymes No results for input(s): TROPONINI in the last 168 hours. RADIOLOGY:  DG Chest Portable 1 View  Result Date: 03/26/2020 CLINICAL DATA:  Dyspnea EXAM: PORTABLE CHEST 1 VIEW COMPARISON:  03/24/2020 FINDINGS: Pulmonary insufflation has slightly decreased, however, lung volumes are normal. There has developed, however, progressive patchy interstitial and airspace infiltrate predominantly within the lung bases bilaterally, infection versus aspiration. Tiny bilateral pleural effusions have developed. No pneumothorax. Cardiac size within normal limits. No acute bone abnormality. Degenerative changes are seen within the  shoulders bilaterally. IMPRESSION: Progressive, asymmetric, predominantly bibasilar pulmonary infiltrate, likely related to infection or aspiration. Interval development of tiny bilateral pleural effusions. Electronically Signed   By: Kathryn Numbers MD   On: 03/26/2020 23:55   ASSESSMENT AND PLAN:  Kathryn [name]  is a 87 y.o. Caucasian female with a known history of multiple  medical problems that are mentioned below, presented to the emergency room with acute onset of worsening dyspnea since Monday with associated orthopnea without cough or wheezing.  Acute new onset CHF, likely diastolic. -Patient came in with increasing shortness of breath. What placed on BiPAP. Chest x-ray showed asymmetric pulmonary edema. Currently she is on 3 L nasal cannula. Able to complete sentence without shortness of breath. Diuresis well. --cont  IV Lasix. -continue BiPAP prn - 2D echo . -Cardiology consultation with Dr. Mariah [name] --follow recommnedaitons -- for home oxygen need.  Hypertensive urgency. -This could be the culprit for #1. -according to patient she has been off for antihypertensives. Her blood pressure is very stable. I will wait for cardiology recommendation. - on as needed IV labetalol.   Hypothyroidism. - continue her Synthroid   Type II diabetes mellitus, well-controlled -Sliding scale and resume metformin    Dyslipidemia. -Patient says her PCP Dr. Sullivan [name] took her off the atorvastatin  GERD. - continue PPI therapy.  COPD. -continue Spiriva as well as as needed albuterol nebulizer.  DVT prophylaxis -- Lovenox   Procedures: Family communication : sister in the ER room Consults : [facility] cardiology Dr. Mariah [name] CODE STATUS: DNR prior to arrival DVT Prophylaxis : Lovenox  Status is: Inpatient  Remains inpatient appropriate because:Inpatient level of care appropriate due to severity of illness   Dispo: The patient is from: Home              Anticipated d/c is to: Home              Anticipated d/c date is: 2 days              Patient currently is not medically stable to d/c.   Admitted for acute onset CHF     TOTAL TIME TAKING CARE OF THIS PATIENT: 25 minutes.  >50% time spent on counselling and coordination of care  Note: This dictation was prepared with Dragon dictation along with smaller phrase technology. Any transcriptional errors  that result from this process are unintentional.  Kathryn [name] M.D    [facility]   CC: Primary care physician; Kathryn [name], MDPatient ID: Kathryn [name], female   DOB: Aug 21, 1933, 87 y.o.   MRN: [id number]

## 2020-03-28 ENCOUNTER — Other Ambulatory Visit: Payer: Self-pay

## 2020-03-28 ENCOUNTER — Encounter: Payer: Self-pay | Admitting: Internal Medicine

## 2020-03-28 ENCOUNTER — Inpatient Hospital Stay (HOSPITAL_COMMUNITY)
Admission: EM | Admit: 2020-03-28 | Discharge: 2020-03-28 | Disposition: A | Discharge status: Home / Self Care | Payer: Medicare Other | Source: Home / Self Care | Attending: Cardiovascular Disease | Admitting: Cardiovascular Disease

## 2020-03-28 ENCOUNTER — Ambulatory Visit: Payer: Self-pay | Admitting: Family Medicine

## 2020-03-28 DIAGNOSIS — I10 Essential (primary) hypertension: Secondary | ICD-10-CM | POA: Diagnosis not present

## 2020-03-28 DIAGNOSIS — J9601 Acute respiratory failure with hypoxia: Secondary | ICD-10-CM | POA: Diagnosis not present

## 2020-03-28 DIAGNOSIS — I509 Heart failure, unspecified: Secondary | ICD-10-CM | POA: Diagnosis not present

## 2020-03-28 DIAGNOSIS — E039 Hypothyroidism, unspecified: Secondary | ICD-10-CM | POA: Diagnosis not present

## 2020-03-28 DIAGNOSIS — I5031 Acute diastolic (congestive) heart failure: Secondary | ICD-10-CM

## 2020-03-28 DIAGNOSIS — I5021 Acute systolic (congestive) heart failure: Secondary | ICD-10-CM | POA: Diagnosis not present

## 2020-03-28 LAB — CBG MONITORING, ED
Glucose-Capillary: 158 mg/dL — ABNORMAL HIGH (ref 70–99)
Glucose-Capillary: 140 mg/dL — ABNORMAL HIGH (ref 70–99)
Glucose-Capillary: 99 mg/dL (ref 70–99)
Glucose-Capillary: 112 mg/dL — ABNORMAL HIGH (ref 70–99)

## 2020-03-28 LAB — BASIC METABOLIC PANEL
Anion gap: 10 (ref 5–15)
BUN: 25 mg/dL — ABNORMAL HIGH (ref 8–23)
CO2: 30 mmol/L (ref 22–32)
Calcium: 9.6 mg/dL (ref 8.9–10.3)
Chloride: 98 mmol/L (ref 98–111)
Creatinine, Ser: 0.85 mg/dL (ref 0.44–1.00)
GFR, Estimated: >60 mL/min (ref >60)
Glucose, Bld: 132 mg/dL — ABNORMAL HIGH (ref 70–99)
Potassium: 3.9 mmol/L (ref 3.5–5.1)
Sodium: 138 mmol/L (ref 135–145)

## 2020-03-28 LAB — CBC
HCT: 37.7 % (ref 36.0–46.0)
Hemoglobin: 11.1 g/dL — ABNORMAL LOW (ref 12.0–15.0)
MCH: 25.1 pg — ABNORMAL LOW (ref 26.0–34.0)
MCHC: 29.4 g/dL — ABNORMAL LOW (ref 30.0–36.0)
MCV: 85.1 fL (ref 80.0–100.0)
Platelets: 270 10*3/uL (ref 150–400)
RBC: 4.43 MIL/uL (ref 3.87–5.11)
RDW: 15.6 % — ABNORMAL HIGH (ref 11.5–15.5)
WBC: 9.2 10*3/uL (ref 4.0–10.5)
nRBC: 0 % (ref 0.0–0.2)

## 2020-03-28 LAB — ECHOCARDIOGRAM COMPLETE
AR max vel: 2.08 cm2
AV Area VTI: 2.55 cm2
AV Area mean vel: 2.09 cm2
AV Mean grad: 3 mmHg
AV Peak grad: 4.8 mmHg
Ao pk vel: 1.1 m/s
Area-P 1/2: 4.31 cm2
Calc EF: 38.2 %
Height: 64 in
S' Lateral: 2.98 cm
Single Plane A2C EF: 42.5 %
Single Plane A4C EF: 37.2 %
Weight: 2864 oz

## 2020-03-28 NOTE — Progress Notes (Signed)
Progress Note  Patient Name: Kathryn [name] Date of Encounter: 03/28/2020  Primary Cardiologist: Dr. Rockey Situ  Subjective   Reports symptom improvement though still not at her baseline. Still notes some SOB and abdominal distention.  She continues to note some swelling in her abdomen, though this has significantly improved since starting on diuresis.  She does not use oxygen at home and continues on 1 L nasal cannula oxygen.   She states that the feeling of her chest in a vice, the feeling which brought her to the emergency department, is dissipating.  Inpatient Medications    Scheduled Meds: . enoxaparin (LOVENOX) injection  0.5 mg/kg Subcutaneous Q24H  . furosemide  20 mg Intravenous [id number]  . insulin aspart  0-9 Units Subcutaneous TID AC & HS  . levothyroxine  100 mcg Oral Q0600  . losartan  25 mg Oral QPM  . metFORMIN  1,000 mg Oral Q breakfast  . pantoprazole  40 mg Oral Daily  . tiotropium  18 mcg Inhalation Daily   Continuous Infusions:  PRN Meds: acetaminophen **OR** acetaminophen, albuterol, labetalol, magnesium hydroxide, ondansetron **OR** ondansetron (ZOFRAN) IV, traZODone   Vital Signs    Vitals:   03/27/20 2228 03/28/20 0400 03/28/20 0538 03/28/20 0842  BP: (!) 126/58 (!) 102/53 (!) 102/53   Pulse: 85 77 77   Resp: 18  18   Temp:      TempSrc:      SpO2: 96% 95% 95% 97%  Weight:      Height:       No intake or output data in the 24 hours ending 03/28/20 1515 Last 3 Weights 03/26/2020 03/24/2020 01/19/2020  Weight (lbs) 179 lb 179 lb 179 lb  Weight (kg) 81.194 kg 81.194 kg 81.194 kg      Telemetry    Telemetry for incorrect pt / not current pt on telemetry screen - Personally Reviewed  ECG    No new tracings - Personally Reviewed  Physical Exam   GEN: No acute distress.   Neck: JVD difficult to assess due to body habitus Cardiac: RRR, soft heart sounds, no murmurs, rubs, or gallops.  Respiratory: L base crackles, RLL reduced breath  sounds. GI: slightly distended, nontender MS: Moderate bilateral edema; No deformity. Neuro:  Nonfocal  Psych: Normal affect   Labs    High Sensitivity Troponin:   Recent Labs  Lab 03/24/20 0948 03/24/20 1211 03/26/20 2332 03/27/20 0139  TROPONINIHS 45* 45* 28* 31*      Chemistry Recent Labs  Lab 03/24/20 0948 03/26/20 2332 03/27/20 0435  NA 139 138 139  K 4.6 3.6 4.5  CL 105 105 106  CO2 24 22 19*  GLUCOSE 163* 223* 237*  BUN 14 18 19   CREATININE 0.71 0.78 0.83  CALCIUM 10.0 9.5 9.0  PROT 6.8 7.4  --   ALBUMIN 3.6 3.8  --   AST 18 21  --   ALT 15 15  --   ALKPHOS 58 70  --   BILITOT 0.9 0.6  --   GFRNONAA >60 >60 >60  ANIONGAP 10 11 14      Hematology Recent Labs  Lab 03/24/20 0948 03/26/20 2332 03/27/20 0435  WBC 5.9 8.1 7.7  RBC 4.44 4.39 3.90  HGB 11.3* 11.1* 10.0*  HCT 37.5 38.2 33.6*  MCV 84.5 87.0 86.2  MCH 25.5* 25.3* 25.6*  MCHC 30.1 29.1* 29.8*  RDW 15.6* 15.6* 15.7*  PLT 270 273 208    BNP Recent Labs  Lab 03/26/20 2329  BNP 337.8*     DDimer No results for input(s): DDIMER in the last 168 hours.   Radiology    DG Chest Portable 1 View  Result Date: 03/26/2020 CLINICAL DATA:  Dyspnea EXAM: PORTABLE CHEST 1 VIEW COMPARISON:  03/24/2020 FINDINGS: Pulmonary insufflation has slightly decreased, however, lung volumes are normal. There has developed, however, progressive patchy interstitial and airspace infiltrate predominantly within the lung bases bilaterally, infection versus aspiration. Tiny bilateral pleural effusions have developed. No pneumothorax. Cardiac size within normal limits. No acute bone abnormality. Degenerative changes are seen within the shoulders bilaterally. IMPRESSION: Progressive, asymmetric, predominantly bibasilar pulmonary infiltrate, likely related to infection or aspiration. Interval development of tiny bilateral pleural effusions. Electronically Signed   By: Helyn Numbers MD   On: 03/26/2020 23:55    Cardiac  Studies   Pending echo  2014 MPI  2012 Echo Scanned  Patient Profile     87 y.o. female with history of severe COPD, prior long history of tobacco use approximately 50 years, documented medication noncompliance, presenting with several days of worsening shortness of breath and leg edema cardiology consulted for further recommendations.  Assessment & Plan    Newly diagnosed heart failure with EF currently unknown --Improving SOB and abdominal distention / LEE per pt and since starting on IV diuresis. Still not at her baseline. Reports that she was not previously on a home diuretic. Not on O2 at home prior to admission. As initially reported, SOB likely multifactorial in the setting of COPD, volume overload, and possible infection. No temperature recorded for pt. CXR shows increasing pulmonary infiltrate, possibly relating to infection, which is likely also contributing to her sx. L sided crackles appreciated on exam with RLL reduced breath sound.  Considered also is PE given ST and hypoxia with echo pending to assess right heart pressures / strain.  --Continue IV lasix as renal function allows and until euvolemic on exam. --Will need to transition to oral diuresis before discharge. --Continue to monitor I/O, daily weights. Current I/Os and wt not well recorded.  --Daily BMET. Monitor renal function and electrolytes.  --Titrate oxygen as needed given oxygen saturations of 95% on review of vitals. --CHF education. Discussed fluids, salt. --Echo pending to reassess EF and rule out WMA or acute structural changes. If EF reduced or WMA, recommend further ischemic workup at that time. Given ST and hypoxia, evaluate echo for evidence of significant right heart strain on echo. If right heart strain, recommend chest CTA for further workup at that time per IM. Further recommendations pending echo.  New Left Bundle Branch Block --On review of EMR, no previous history of LBBB. Most recent EKG shows  widened QRS progressed to LBBB.  HS Tn not significantly elevated. Denies any chest pain. As above, obtain echo. Further recommendations regarding ischemic workup as indicated at that time. Will also repeat an EKG.   Hypertension --Current BP well controlled to soft. Appears well controlled since presentation to ED. Continue current losartan 25mg  daily, lasix as soft BP tolerates. Avoid prerenal AKI.  Hypothyroidism --Continue Synthroid. Per IM.  DM2 --SSI, Per IM.   For questions or updates, please contact [facility] Please consult [email or web address] for contact info under        Signed, , PA-C  03/28/2020, 3:15 PM

## 2020-03-28 NOTE — Progress Notes (Signed)
[facility]  - [place] [facility]   PATIENT NAME: Kathryn [name]    MR#:  [id number]  DATE OF BIRTH:  1933-12-04  SUBJECTIVE:  patient came in with increasing shortness of breath for couple days. She also noticed some lower extremity edema. Has not been on blood pressure meds since has been taken off by her primary care. Denies history of CHF in the past  Patient feels a whole lot better today. Denies any chest tightness or shortness of breath. She is a little uncomfortable in her bed. Getting echo done). Urine output of 700 mL yesterday  No chest pain. No family in the room  REVIEW OF SYSTEMS:   Review of Systems  Constitutional: Negative for chills, fever and weight loss.  HENT: Negative for ear discharge, ear pain and nosebleeds.   Eyes: Negative for blurred vision, pain and discharge.  Respiratory: Positive for shortness of breath. Negative for sputum production, wheezing and stridor.   Cardiovascular: Positive for leg swelling. Negative for chest pain, palpitations, orthopnea and PND.  Gastrointestinal: Negative for abdominal pain, diarrhea, nausea and vomiting.  Genitourinary: Negative for frequency and urgency.  Musculoskeletal: Negative for back pain and joint pain.  Neurological: Positive for weakness. Negative for sensory change, speech change and focal weakness.  Psychiatric/Behavioral: Negative for depression and hallucinations. The patient is not nervous/anxious.     Tolerating Diet:yes Tolerating PT: HHPT  DRUG ALLERGIES:   Allergies  Allergen Reactions  . Oxycodone Other (See Comments)    Mental status change  . Prednisone     swelling, bruising.    VITALS:  Blood pressure (!) 102/53, pulse 77, temperature (!) 96.8 F (36 C), temperature source Axillary, resp. rate 18, height 5\' 4"  (1.626 m), weight 81.2 kg, SpO2 97 %.  PHYSICAL EXAMINATION:   Physical Exam  GENERAL:  87 y.o.-year-old patient lying in the bed with no acute distress.   HEENT: Head atraumatic, normocephalic. Oropharynx and nasopharynx clear.  LUNGS: Normal breath sounds bilaterally, no wheezing, rales, rhonchi. No use of accessory muscles of respiration.  CARDIOVASCULAR: S1, S2 normal. No murmurs, rubs, or gallops.  ABDOMEN: Soft, nontender, nondistended. Bowel sounds present. No organomegaly or mass.  EXTREMITIES: 1+ edema b/l.    NEUROLOGIC: Cranial nerves II through XII are intact. No focal Motor or sensory deficits b/l.   PSYCHIATRIC:  patient is alert and oriented x 3.  SKIN: No obvious rash, lesion, or ulcer.   LABORATORY PANEL:  CBC Recent Labs  Lab 03/27/20 0435  WBC 7.7  HGB 10.0*  HCT 33.6*  PLT 208    Chemistries  Recent Labs  Lab 03/26/20 2332 03/27/20 0435  NA 138 139  K 3.6 4.5  CL 105 106  CO2 22 19*  GLUCOSE 223* 237*  BUN 18 19  CREATININE 0.78 0.83  CALCIUM 9.5 9.0  AST 21  --   ALT 15  --   ALKPHOS 70  --   BILITOT 0.6  --    Cardiac Enzymes No results for input(s): TROPONINI in the last 168 hours. RADIOLOGY:  DG Chest Portable 1 View  Result Date: 03/26/2020 CLINICAL DATA:  Dyspnea EXAM: PORTABLE CHEST 1 VIEW COMPARISON:  03/24/2020 FINDINGS: Pulmonary insufflation has slightly decreased, however, lung volumes are normal. There has developed, however, progressive patchy interstitial and airspace infiltrate predominantly within the lung bases bilaterally, infection versus aspiration. Tiny bilateral pleural effusions have developed. No pneumothorax. Cardiac size within normal limits. No acute bone abnormality. Degenerative changes are seen within the  shoulders bilaterally. IMPRESSION: Progressive, asymmetric, predominantly bibasilar pulmonary infiltrate, likely related to infection or aspiration. Interval development of tiny bilateral pleural effusions. Electronically Signed   By: Fidela Salisbury MD   On: 03/26/2020 23:55   ASSESSMENT AND PLAN:  Kathryn [name]  is a 87 y.o. Caucasian female with a known history of  multiple medical problems that are mentioned below, presented to the emergency room with acute onset of worsening dyspnea since Monday with associated orthopnea without cough or wheezing.  Acute new onset CHF, likely diastolic. -Patient came in with increasing shortness of breath. What placed on BiPAP. Chest x-ray showed asymmetric pulmonary edema. Currently she is on 3 L nasal cannula. Able to complete sentence without shortness of breath. Diuresis well. --cont  IV Lasix. -continue BiPAP prn - 2D echo done -Cardiology consultation with Dr. Rockey Situ -- on lasix and losartan -- assess for home oxygen need.  Hypertensive urgency. -This could be the culprit for #1. -according to patient she has been off for antihypertensives. Her blood pressure is very stable. I will wait for cardiology recommendation. - on as needed IV labetalol. -BP much better -on po losartan   Hypothyroidism. - continue her Synthroid   Type II diabetes mellitus, well-controlled -Sliding scale and resume metformin    Dyslipidemia. -Patient says her PCP Dr. Rosanna Randy took her off the atorvastatin  GERD. - continue PPI therapy.  COPD. -continue Spiriva as well as as needed albuterol nebulizer.  DVT prophylaxis -- Lovenox   Procedures: Family communication : sister in the ER room Consults : [place] cardiology Dr. Rockey Situ CODE STATUS: DNR prior to arrival DVT Prophylaxis : Lovenox  Status is: Inpatient  Remains inpatient appropriate because:Inpatient level of care appropriate due to severity of illness   Dispo: The patient is from: Home              Anticipated d/c is to: Home              Anticipated d/c date [id number] tomorrow              Patient currently is medically stable and improving. Will keep her one more day to ensure she is tolerating her BP meds including diuresis.pt is in agreement     TOTAL TIME TAKING CARE OF THIS PATIENT: 25 minutes.  >50% time spent on counselling and coordination  of care  Note: This dictation was prepared with Dragon dictation along with smaller phrase technology. Any transcriptional errors that result from this process are unintentional.  Fritzi Mandes M.D    [facility]   CC: Primary care physician; Jerrol Banana., MDPatient ID: Thom Chimes, female   DOB: 1933/05/10, 87 y.o.   MRN: [id number]

## 2020-03-28 NOTE — Evaluation (Signed)
Physical Therapy Evaluation Patient Details Name: Kathryn [name] MRN: [id number] DOB: 11-09-1933 Today's Date: 03/28/2020   History of Present Illness  Pt is an 87 y.o. female with a history of COPD, DM, peptic ulcer disease, hypertension, hyperlipidemia, arthritis who presents for evaluation of shortness of breath. Chest x-ray "progressive asymmetric predominantly bibasal pulmonary infiltrates likely related to infection or aspiration with interval development of tiny bilateral pleural effusions.Covid+    Clinical Impression  Pt was alert, oriented, agreeable to PT. Reported she had just moved into her son's mother in [place]. Pt reported at baseline she in independent for ADLs and most IADLs. 1 fall in the last 6 months.   The patient demonstrated bed mobility modI. Able to sit <> stand with RW and without RW several times with supervision/CGA. The pt was able to step towards [facility], march in place, and step forwards/backwards at EOB. ~6 minutes in standing total. Session very busy; arrival of breakfast, RN in room to administer medications and assist with linen change, MD arrival and departure. Pt able to speak and mobilize throughout these interruptions, reported she feels better than yesterday. Questionable desaturation on 2L while standing and speaking, pt denied symptoms. Pt did have mild SOB at end of session that resolved with rest.  Overall the patient demonstrated deficits (see "PT Problem List") that impede the patient's functional abilities, safety, and mobility and would benefit from skilled PT intervention. Recommendation is HHPT with intermittent supervision and pt encouraged to use rollator at home for safety.     Follow Up Recommendations Home health PT;Supervision - Intermittent    Equipment Recommendations  None recommended by PT (pt has rollator at home)    Recommendations for Other Services       Precautions / Restrictions Precautions Precautions:  Fall Restrictions Weight Bearing Restrictions: No      Mobility  Bed Mobility Overal bed mobility: Modified Independent             General bed mobility comments: extended time, HOB elevated    Transfers Overall transfer level: Needs assistance Equipment used: Rolling walker (2 wheeled) Transfers: Sit to/from Stand Sit to Stand: Supervision;From elevated surface         General transfer comment: from EOB twice, no physical assist needed  Ambulation/Gait Ambulation/Gait assistance: Supervision;Min guard Gait Distance (Feet):  (pt able to step towards [facility], march in place, and step forwarsd/backwards at EOB. ~6 minutes in standing total.) Assistive device: Rolling walker (2 wheeled)       General Gait Details: unable to ambulate in room due to lines/leads/pt incontinence  Stairs            Wheelchair Mobility    Modified Rankin (Stroke Patients Only)       Balance Overall balance assessment: Needs assistance Sitting-balance support: Feet supported Sitting balance-Leahy Scale: Normal       Standing balance-Leahy Scale: Good Standing balance comment: pt able to stand, shift weight, without UE support. RW provided to maximize endurance/activity tolerance                             Pertinent Vitals/Pain Pain Assessment: No/denies pain    Home Living Family/patient expects to be discharged to:: Private residence (mother in law suite with son) Living Arrangements: Alone;Other (Comment) (son is close by due to pts residence being a mother in [place]) Available Help at Discharge: Family;Available PRN/intermittently Type of Home: House Home Access: Stairs to enter Entrance  Stairs-Rails: Can reach both Entrance Stairs-Number of Steps: 2 Home Layout: One level Home Equipment: Walker - 2 wheels;Walker - 4 wheels;Wheelchair - manual;Cane - single point      Prior Function Level of Independence: Independent         Comments: pt has DME  but is not using currently. performs most IADLs independently. 1 Fall in the last 6 months, pt reported she tripped over something in her sons house     Hand Dominance   Dominant Hand: Right    Extremity/Trunk Assessment   Upper Extremity Assessment Upper Extremity Assessment: Overall WFL for tasks assessed    Lower Extremity Assessment Lower Extremity Assessment: Generalized weakness (pt able to lift extremities against gravity independently)    Cervical / Trunk Assessment Cervical / Trunk Assessment: Normal  Communication   Communication: No difficulties  Cognition Arousal/Alertness: Awake/alert Behavior During Therapy: WFL for tasks assessed/performed Overall Cognitive Status: Within Functional Limits for tasks assessed                                        General Comments      Exercises Other Exercises Other Exercises: Pt assisted with gown change, bed linen changed, and pericare after incontinent episode. Other Exercises: Pt educated about use of rollator at discharge as well as safe stair navigation to maximize safety, verbalized understanding   Assessment/Plan    PT Assessment Patient needs continued PT services  PT Problem List Decreased strength;Decreased mobility;Decreased activity tolerance;Decreased balance;Decreased knowledge of use of DME       PT Treatment Interventions DME instruction;Therapeutic exercise;Gait training;Balance training;Stair training;Neuromuscular re-education;Functional mobility training;Therapeutic activities;Patient/family education    PT Goals (Current goals can be found in the Care Plan section)  Acute Rehab PT Goals Patient Stated Goal: to get her strength back PT Goal Formulation: With patient Time For Goal Achievement: 04/11/20 Potential to Achieve Goals: Good    Frequency Min 2X/week   Barriers to discharge        Co-evaluation               AM-PAC PT "6 Clicks" Mobility  Outcome Measure  Help needed turning from your back to your side while in a flat bed without using bedrails?: None Help needed moving from lying on your back to sitting on the side of a flat bed without using bedrails?: None Help needed moving to and from a bed to a chair (including a wheelchair)?: None Help needed standing up from a chair using your arms (e.g., wheelchair or bedside chair)?: None Help needed to walk in hospital room?: A Little Help needed climbing 3-5 steps with a railing? : A Little 6 Click Score: 22    End of Session Equipment Utilized During Treatment: Oxygen (2L) Activity Tolerance: Patient tolerated treatment well Patient left: in bed;with call bell/phone within reach Nurse Communication: Mobility status PT Visit Diagnosis: Other abnormalities of gait and mobility (R26.89);Muscle weakness (generalized) (M62.81)    Time: [id number] PT Time Calculation (min) (ACUTE ONLY): 30 min   Charges:   PT Evaluation $PT Eval Low Complexity: 1 Low PT Treatments $Therapeutic Activity: 8-22 mins       Lieutenant Diego PT, DPT 10:23 AM,03/28/20

## 2020-03-28 NOTE — Consult Note (Signed)
   Heart Failure Nurse Navigator Note  Echocardiogram is pending at this time.   She presented to the emergency room by way of EMS for worsening shortness of breath and ankle edema.  Chest x-ray revealed bilateral infiltrates.   Comorbidities:  Arthritis COPD Diabetes Hyperlipidemia Hypertension GERD   She has a history of noncompliance with taking her medications.  Medications:  Furosemide 20 mg every 8 hours Losartan 25 mg at at bedtime  Per her PCPs notes lisinopril has been stopped due to cough.   Labs:  Sodium 139, potassium 4.5, chloride 106, CO2 19, BUN 19 creatinine 0.83, hemoglobin 10, hematocrit 33.6, lactic acid 4.2, BNP 337, troponin level 28 and 45. Weight is 81.2 kg BMI is 30.73 Blood pressure 102/53  Assessment:  General-she is awake and alert sitting up on the gurney in the emergency room currently on O2 per nasal cannula.  HEENT-wears glasses pupils are equal non icteric.  Cardiac-heart tones of regular rate and rhythm no murmur gallop appreciated.  Abdomen-soft nontender, positive bowel sounds  Musculoskeletal-no lower extremity edema, skin is wrinkled.  Psych-is pleasant and appropriate.  Neurologic-speech is clear moves all extremities without difficulty.   Initial visit with patient.  She states that nothing like this is ever happened to her before.  She had noticed the lower extremity edema along with some wheezing and chest pressure.  She states this is the first morning that on awakening she has not been wheezing or having chest pressure.  Discussed low-sodium, not using salt at the table, she states I do not think I can live without" My salt." Discussed  using salt substitute such as Mrs. Dash.  Talked about the possibility of having to return to the ED with more bouts of heart failure if she is unwilling to give up her salt.  Talked about the importance of weighing daily,recording, reporting 2 to 3 pound weight gain overnight or 5  pounds within a week.  He was given heart failure teaching booklet, zone magnet information about the heart failure clinic.   Tresa Endo RN, CHFN

## 2020-03-28 NOTE — Progress Notes (Signed)
*  PRELIMINARY RESULTS* Echocardiogram 2D Echocardiogram has been performed.  Cristela Blue 03/28/2020, 2:47 PM

## 2020-03-28 NOTE — Plan of Care (Signed)
  Problem: Education: Goal: Knowledge of General Education information will improve Description: Including pain rating scale, medication(s)/side effects and non-pharmacologic comfort measures Outcome: Progressing Note: Patient profile completed. No pain.

## 2020-03-29 ENCOUNTER — Encounter: Payer: Self-pay | Admitting: Internal Medicine

## 2020-03-29 ENCOUNTER — Ambulatory Visit
Admission: EM | Admit: 2020-03-29 | Discharge: 2020-03-29 | Disposition: A | Discharge status: Home / Self Care | Payer: Medicare Other | Source: Home / Self Care | Attending: Physician Assistant | Admitting: Physician Assistant

## 2020-03-29 DIAGNOSIS — E119 Type 2 diabetes mellitus without complications: Secondary | ICD-10-CM | POA: Diagnosis not present

## 2020-03-29 DIAGNOSIS — I5021 Acute systolic (congestive) heart failure: Secondary | ICD-10-CM

## 2020-03-29 DIAGNOSIS — I471 Supraventricular tachycardia: Secondary | ICD-10-CM

## 2020-03-29 DIAGNOSIS — I447 Left bundle-branch block, unspecified: Secondary | ICD-10-CM

## 2020-03-29 DIAGNOSIS — E039 Hypothyroidism, unspecified: Secondary | ICD-10-CM | POA: Diagnosis not present

## 2020-03-29 DIAGNOSIS — I10 Essential (primary) hypertension: Secondary | ICD-10-CM | POA: Diagnosis not present

## 2020-03-29 DIAGNOSIS — I4719 Other supraventricular tachycardia: Secondary | ICD-10-CM

## 2020-03-29 LAB — URINE CULTURE: Culture: 30000 — AB

## 2020-03-29 LAB — GLUCOSE, CAPILLARY
Glucose-Capillary: 126 mg/dL — ABNORMAL HIGH (ref 70–99)
Glucose-Capillary: 92 mg/dL (ref 70–99)

## 2020-03-29 MED ORDER — LOSARTAN POTASSIUM 25 MG PO TABS: 25.0000 mg | ORAL_TABLET | Freq: Every evening | ORAL | 2 refills | Status: DC

## 2020-03-29 MED ORDER — CARVEDILOL 3.125 MG PO TABS
3.1250 mg | ORAL_TABLET | Freq: Two times a day (BID) | ORAL | Status: DC
  Administered 2020-03-29: 3.125 mg via ORAL
  Filled 2020-03-29: qty 1

## 2020-03-29 MED ORDER — CARVEDILOL 3.125 MG PO TABS: 3.1250 mg | ORAL_TABLET | Freq: Two times a day (BID) | ORAL | 0 refills | Status: DC

## 2020-03-29 MED ORDER — FUROSEMIDE 20 MG PO TABS
20.0000 mg | ORAL_TABLET | Freq: Every day | ORAL | Status: DC
  Administered 2020-03-29: 20 mg via ORAL
  Filled 2020-03-29: qty 1

## 2020-03-29 MED ORDER — ENOXAPARIN SODIUM 40 MG/0.4ML ~~LOC~~ SOLN: 40.0000 mg | SUBCUTANEOUS | Status: DC

## 2020-03-29 MED ORDER — FUROSEMIDE 20 MG PO TABS: 20.0000 mg | ORAL_TABLET | Freq: Every day | ORAL | 1 refills | Status: DC

## 2020-03-29 NOTE — Discharge Summary (Signed)
[facility] NAME: Kathryn [name]    MR#:  [id number]  DATE OF BIRTH:  [birth year]  DATE OF ADMISSION:  03/26/2020 ADMITTING PHYSICIAN: Kathryn Mandes, MD  DATE OF DISCHARGE: 03/29/2020*  PRIMARY CARE PHYSICIAN: Kathryn [name], MD    ADMISSION DIAGNOSIS:  Acute CHF ([place]) [I50.9] Acute CHF (congestive heart failure) ([place]) [I50.9] Acute respiratory failure with hypoxia ([place]) [J96.01]  DISCHARGE DIAGNOSIS:  Acute systolic CHF--new  SECONDARY DIAGNOSIS:   Past Medical History:  Diagnosis Date  . Actinic keratosis   . Arthritis    spinal stenosis  . COPD (chronic obstructive pulmonary disease) ([place])   . Diabetes mellitus without complication ([place])   . Gastric ulcer 4/12   treated with Prolisec- states no problems now  . Hyperlipidemia   . Hypertension    PCP Dr Kathryn [name]   [place]  . Hypothyroidism   . Left bundle branch block (LBBB)   . Neuromuscular disorder ([place])    slight numbness right toes- comes and goes  . Peripheral vascular disease (H[facility])    varicose veins left leg  . Pneumonia   . Seasonal allergies   . Shortness of breath   . Skin cancer    multiple from face  . Squamous cell carcinoma of skin 03/23/2013   L dorsal hand  . Squamous cell carcinoma of skin 02/04/2014   R forearm/in [name], L pretibial  . Squamous cell carcinoma of skin 09/24/2018   R thumb    HOSPITAL COURSE:   [name] a87 y.o.Caucasian femalewith a known history of multiple medical problems that are mentioned below, presented to the emergency room with acute onset of worsening dyspnea since Monday with associated orthopnea without cough or wheezing.  Acute new onset CHF, systolic -Patient came in with increasing shortness of breath. What placed on BiPAP. Chest x-ray showed asymmetric pulmonary edema. Currently she is on 3 L nasal cannul. Able to complete sentence without shortness of breath. Diuresis well. --cont  IV  Lasix--change to po lasix -continue BiPAP prn - 2D echo done--EF 40-45% -Cardiology consultation with Dr. Rockey [name] -- on lasix ,coreg and losartan -- assess for home oxygen --sats >90% on RA with exertion --? Of PAF--cardiology recommends Zio monitor  Hypertensive urgency. -could be the culprit for #1. -according to patient she has been off for antihypertensives. Her blood pressure is very stable. I will wait for cardiology recommendation. - on as needed IV labetalol. -BP much better -on po losartan and added coreg  Hypothyroidism. - continue her Synthroid   Type II diabetes mellitus, well-controlled -Sliding scale and resume metformin   Dyslipidemia. -Patient says her PCP Dr. Rosanna [name] took her off the atorvastatin  GERD. - continue PPI therapy.  COPD. -continue Spiriva as well as as needed albuterol nebulizer.  DVT prophylaxis -- Lovenox   Procedures: Family communication : sister in the ER room--per pt family is informed Consults : [facility] cardiology Dr. Rockey [name] CODE STATUS: DNR prior to arrival DVT Prophylaxis : Lovenox  Status is: Inpatient  Dispo: The patient is from: Home  Anticipated d/c is to: Home  Anticipated d/c date [id number]  Patient currently is medically stable and improving.  D/c home CONSULTS OBTAINED:  Treatment Team:  Kathryn Merritts, MD Kathryn Hampshire, MD  DRUG ALLERGIES:   Allergies  Allergen Reactions  . Oxycodone Other (See Comments)    Mental status change  . Prednisone     swelling, bruising.    DISCHARGE MEDICATIONS:  Allergies as of 03/29/2020      Reactions   Oxycodone Other (See Comments)   Mental status change   Prednisone    swelling, bruising.      Medication List    STOP taking these medications   atorvastatin 40 MG tablet Commonly known as: LIPITOR   cetirizine 10 MG tablet Commonly known as: ZYRTEC   DULoxetine 20 MG capsule Commonly known as: CYMBALTA    FeroSul 325 (65 FE) MG tablet Generic drug: ferrous sulfate     TAKE these medications   acetaminophen 500 MG tablet Commonly known as: TYLENOL Take 1,000 mg by mouth every 6 (six) hours as needed. Pain   albuterol (2.5 MG/3ML) 0.083% nebulizer solution Commonly known as: PROVENTIL Take 3 mLs (2.5 mg total) by nebulization every 6 (six) hours as needed for wheezing or shortness of breath.   carvedilol 3.125 MG tablet Commonly known as: COREG Take 1 tablet (3.125 mg total) by mouth 2 (two) times daily with a meal.   furosemide 20 MG tablet Commonly known as: LASIX Take 1 tablet (20 mg total) by mouth daily. Start taking on: March 30, 2020   glucose blood test strip Commonly known as: Contour Next Test Check sugar once daily  DX E11.9   hydrOXYzine 10 MG tablet Commonly known as: ATARAX/VISTARIL TAKE 1 TO 2 TABLETS(10 TO 20 MG) BY MOUTH EVERY 6 HOURS AS NEEDED   levothyroxine 100 MCG tablet Commonly known as: SYNTHROID TAKE 1 TABLET(100 MCG) BY MOUTH DAILY BEFORE BREAKFAST   losartan 25 MG tablet Commonly known as: COZAAR Take 1 tablet (25 mg total) by mouth every evening.   metFORMIN 1000 MG tablet Commonly known as: GLUCOPHAGE TAKE 1 TABLET(1000 MG) BY MOUTH DAILY WITH SUPPER   omeprazole 20 MG capsule Commonly known as: PRILOSEC TAKE 1 CAPSULE(20 MG) BY MOUTH DAILY   tiotropium 18 MCG inhalation capsule Commonly known as: SPIRIVA Place 18 mcg into inhaler and inhale daily as needed.       If you experience worsening of your admission symptoms, develop shortness of breath, life threatening emergency, suicidal or homicidal thoughts you must seek medical attention immediately by calling 911 or calling your MD immediately  if symptoms less severe.  You Must read complete instructions/literature along with all the possible adverse reactions/side effects for all the Medicines you take and that have been prescribed to you. Take any new Medicines after you have  completely understood and accept all the possible adverse reactions/side effects.   Please note  You were cared for by a hospitalist during your hospital stay. If you have any questions about your discharge medications or the care you received while you were in the hospital after you are discharged, you can call the unit and asked to speak with the hospitalist on call if the hospitalist that took care of you is not available. Once you are discharged, your primary care physician will handle any further medical issues. Please note that NO REFILLS for any discharge medications will be authorized once you are discharged, as it is imperative that you return to your primary care physician (or establish a relationship with a primary care physician if you do not have one) for your aftercare needs so that they can reassess your need for medications and monitor your lab values. Today   SUBJECTIVE   Doing well  no sob  VITAL SIGNS:  Blood pressure 130/72, pulse 86, temperature 98 F (36.7 C), temperature source Oral, resp. rate 18, height 5\' 4"  (  1.626 m), weight 78.5 kg, SpO2 94 %.  I/O:    Intake/Output Summary (Last 24 hours) at 03/29/2020 1258 Last data filed at 03/29/2020 1043 Gross per 24 hour  Intake 240 ml  Output --  Net 240 ml    PHYSICAL EXAMINATION:  GENERAL:  87 y.o.-year-old patient lying in the bed with no acute distress.  HEENT: Head atraumatic, normocephalic. Oropharynx and nasopharynx clear.  LUNGS: Normal breath sounds bilaterally, no wheezing, rales,rhonchi or crepitation. No use of accessory muscles of respiration.  CARDIOVASCULAR: S1, S2 normal. No murmurs, rubs, or gallops.  ABDOMEN: Soft, non-tender, non-distended. Bowel sounds present. No organomegaly or mass.  EXTREMITIES: No pedal edema, cyanosis, or clubbing.  NEUROLOGIC: Cranial nerves II through XII are intact. Muscle strength 5/5 in all extremities. Sensation intact. Gait not checked.  PSYCHIATRIC: The patient is  alert and oriented x 3.  SKIN: No obvious rash, lesion, or ulcer.   DATA REVIEW:   CBC  Recent Labs  Lab 03/28/20 2224  WBC 9.2  HGB 11.1*  HCT 37.7  PLT 270    Chemistries  Recent Labs  Lab 03/26/20 2332 03/27/20 0435 03/28/20 2224  NA 138   < > 138  K 3.6   < > 3.9  CL 105   < > 98  CO2 22   < > 30  GLUCOSE 223*   < > 132*  BUN 18   < > 25*  CREATININE 0.78   < > 0.85  CALCIUM 9.5   < > 9.6  AST 21  --   --   ALT 15  --   --   ALKPHOS 70  --   --   BILITOT 0.6  --   --    < > = values in this interval not displayed.    Microbiology Results   Recent Results (from the past 240 hour(s))  SARS CORONAVIRUS 2 (TAT 6-24 HRS) Nasopharyngeal Nasopharyngeal Swab     Status: None   Collection Time: 03/24/20  4:00 PM   Specimen: Nasopharyngeal Swab  Result Value Ref Range Status   SARS Coronavirus 2 NEGATIVE NEGATIVE Final    Comment: (NOTE) SARS-CoV-2 target nucleic acids are NOT DETECTED.  The SARS-CoV-2 RNA is generally detectable in upper and lower respiratory specimens during the acute phase of infection. Negative results do not preclude SARS-CoV-2 infection, do not rule out co-infections with other pathogens, and should not be used as the sole basis for treatment or other patient management decisions. Negative results must be combined with clinical observations, patient history, and epidemiological information. The expected result is Negative.  Fact Sheet for Patients: HairSlick.no  Fact Sheet for Healthcare Providers: quierodirigir.com  This test is not yet approved or cleared by the [place] FDA and  has been authorized for detection and/or diagnosis of SARS-CoV-2 by FDA under an Emergency Use Authorization (EUA). This EUA will remain  in effect (meaning this test can be used) for the duration of the COVID-19 declaration under Se ction 564(b)(1) of the Act, 21 U.S.C. section 360bbb-3(b)(1),  unless the authorization is terminated or revoked sooner.  Performed at [facility], [address], [place], [place] [address]   Resp Panel by RT-PCR (Flu A&B, Covid) Nasopharyngeal Swab     Status: None   Collection Time: 03/26/20 11:29 PM   Specimen: Nasopharyngeal Swab; Nasopharyngeal(NP) swabs in vial transport medium  Result Value Ref Range Status   SARS Coronavirus 2 by RT PCR NEGATIVE NEGATIVE Final  Comment: (NOTE) SARS-CoV-2 target nucleic acids are NOT DETECTED.  The SARS-CoV-2 RNA is generally detectable in upper respiratory specimens during the acute phase of infection. The lowest concentration of SARS-CoV-2 viral copies this assay can detect is 138 copies/mL. A negative result does not preclude SARS-Cov-2 infection and should not be used as the sole basis for treatment or other patient management decisions. A negative result may occur with  improper specimen collection/handling, submission of specimen other than nasopharyngeal swab, presence of viral mutation(s) within the areas targeted by this assay, and inadequate number of viral copies(<138 copies/mL). A negative result must be combined with clinical observations, patient history, and epidemiological information. The expected result is Negative.  Fact Sheet for Patients:  EntrepreneurPulse.com.au  Fact Sheet for Healthcare Providers:  IncredibleEmployment.be  This test is no t yet approved or cleared by the [place] FDA and  has been authorized for detection and/or diagnosis of SARS-CoV-2 by FDA under an Emergency Use Authorization (EUA). This EUA will remain  in effect (meaning this test can be used) for the duration of the COVID-19 declaration under Section 564(b)(1) of the Act, 21 U.S.C.section 360bbb-3(b)(1), unless the authorization is terminated  or revoked sooner.       Influenza A by PCR NEGATIVE NEGATIVE Final   Influenza B by PCR NEGATIVE NEGATIVE  Final    Comment: (NOTE) The Xpert Xpress SARS-CoV-2/FLU/RSV plus assay is intended as an aid in the diagnosis of influenza from Nasopharyngeal swab specimens and should not be used as a sole basis for treatment. Nasal washings and aspirates are unacceptable for Xpert Xpress SARS-CoV-2/FLU/RSV testing.  Fact Sheet for Patients: EntrepreneurPulse.com.au  Fact Sheet for Healthcare Providers: IncredibleEmployment.be  This test is not yet approved or cleared by the [place] FDA and has been authorized for detection and/or diagnosis of SARS-CoV-2 by FDA under an Emergency Use Authorization (EUA). This EUA will remain in effect (meaning this test can be used) for the duration of the COVID-19 declaration under Section 564(b)(1) of the Act, 21 U.S.C. section 360bbb-3(b)(1), unless the authorization is terminated or revoked.  Performed at [facility], [address] [place], [place] [address]   Urine culture     Status: Abnormal   Collection Time: 03/27/20  9:00 AM   Specimen: In/Out Cath Urine  Result Value Ref Range Status   Specimen Description   Final    IN/OUT CATH URINE Performed at [facility], [address] [place], [place] [address]    Special Requests   Final    NONE Performed at [facility], [address] [place], [place] [address]    Culture (A)  Final    30,000 COLONIES/mL MULTIPLE SPECIES PRESENT, SUGGEST RECOLLECTION   Report Status 03/29/2020 FINAL  Final    RADIOLOGY:  ECHOCARDIOGRAM COMPLETE  Result Date: 03/28/2020    ECHOCARDIOGRAM REPORT   Patient Name:   ORETA THORNGREN Date of Exam: 03/28/2020 Medical Rec #:  [id number]       Height:       64.0 in Accession #:    [id number]      Weight:       179.0 lb Date of Birth:  1933/10/25       BSA:          1.866 m Patient Age:    12 years        BP:           102/53 mmHg Patient Gender: F  HR:           77 bpm. Exam Location:  [facility]  Procedure: 2D Echo, Cardiac Doppler, Color Doppler and Strain Analysis Indications:     CHF-acute diastolic I50.31  History:         Patient has no prior history of Echocardiogram examinations.                  COPD, Signs/Symptoms:Shortness of Breath; Risk                  Factors:Hypertension and Diabetes.  Sonographer:     Cristela Blue RDCS (AE) Referring Phys:  [id number] Antonieta Iba Diagnosing Phys: Lorine Bears MD  Sonographer Comments: No parasternal window and no subcostal window. Global longitudinal strain was attempted. IMPRESSIONS  1. Left ventricular ejection fraction, by estimation, is 40 to 45%. The left ventricle has mildly decreased function. Left ventricular endocardial border not optimally defined to evaluate regional wall motion. Left ventricular diastolic parameters are consistent with Grade II diastolic dysfunction (pseudonormalization). The global longitudinal strain is abnormal.  2. Right ventricular systolic function is normal. The right ventricular size is normal. Tricuspid regurgitation signal is inadequate for assessing PA pressure.  3. Left atrial size was mildly dilated.  4. The mitral valve is abnormal. Mild mitral valve regurgitation. No evidence of mitral stenosis.  5. The aortic valve is normal in structure. Aortic valve regurgitation is mild. Mild aortic valve sclerosis is present, with no evidence of aortic valve stenosis. FINDINGS  Left Ventricle: Left ventricular ejection fraction, by estimation, is 40 to 45%. The left ventricle has mildly decreased function. Left ventricular endocardial border not optimally defined to evaluate regional wall motion. The global longitudinal strain  is abnormal. The left ventricular internal cavity size was normal in size. There is no left ventricular hypertrophy. Left ventricular diastolic parameters are consistent with Grade II diastolic dysfunction (pseudonormalization). Right Ventricle: The right ventricular size is normal. No increase in right  ventricular wall thickness. Right ventricular systolic function is normal. Tricuspid regurgitation signal is inadequate for assessing PA pressure. Left Atrium: Left atrial size was mildly dilated. Right Atrium: Right atrial size was normal in size. Pericardium: There is no evidence of pericardial effusion. Mitral Valve: The mitral valve is abnormal. There is moderate thickening of the mitral valve leaflet(s). Mild mitral annular calcification. Mild mitral valve regurgitation. No evidence of mitral valve stenosis. Tricuspid Valve: The tricuspid valve is normal in structure. Tricuspid valve regurgitation is not demonstrated. No evidence of tricuspid stenosis. Aortic Valve: The aortic valve is normal in structure. Aortic valve regurgitation is mild. Mild aortic valve sclerosis is present, with no evidence of aortic valve stenosis. Aortic valve mean gradient measures 3.0 mmHg. Aortic valve peak gradient measures 4.8 mmHg. Aortic valve area, by VTI measures 2.55 cm. Pulmonic Valve: The pulmonic valve was normal in structure. Pulmonic valve regurgitation is not visualized. No evidence of pulmonic stenosis. Aorta: The aortic root is normal in size and structure. Venous: The inferior vena cava was not well visualized. IAS/Shunts: No atrial level shunt detected by color flow Doppler.  LEFT VENTRICLE PLAX 2D LVIDd:         3.80 cm     Diastology LVIDs:         2.98 cm     LV e' medial:    5.22 cm/s LV PW:         1.13 cm     LV E/e' medial:  16.6 LV IVS:  0.83 cm     LV e' lateral:   4.35 cm/s LVOT diam:     2.00 cm     LV E/e' lateral: 19.9 LV SV:         47 LV SV Index:   25 LVOT Area:     3.14 cm  LV Volumes (MOD) LV vol d, MOD A2C: 99.4 ml LV vol d, MOD A4C: 59.2 ml LV vol s, MOD A2C: 57.2 ml LV vol s, MOD A4C: 37.2 ml LV SV MOD A2C:     42.2 ml LV SV MOD A4C:     59.2 ml LV SV MOD BP:      30.5 ml RIGHT VENTRICLE RV Basal diam:  2.91 cm RV S prime:     11.60 cm/s TAPSE (M-mode): 3.2 cm LEFT ATRIUM              Index       RIGHT ATRIUM           Index LA diam:        4.00 cm 2.14 cm/m  RA Area:     13.30 cm LA Vol (A2C):   55.5 ml 29.74 ml/m RA Volume:   31.60 ml  16.93 ml/m LA Vol (A4C):   39.1 ml 20.95 ml/m LA Biplane Vol: 50.7 ml 27.17 ml/m  AORTIC VALVE AV Area (Vmax):    2.08 cm AV Area (Vmean):   2.09 cm AV Area (VTI):     2.55 cm AV Vmax:           109.70 cm/s AV Vmean:          80.000 cm/s AV VTI:            0.185 m AV Peak Grad:      4.8 mmHg AV Mean Grad:      3.0 mmHg LVOT Vmax:         72.50 cm/s LVOT Vmean:        53.300 cm/s LVOT VTI:          0.150 m LVOT/AV VTI ratio: 0.81 MITRAL VALVE               TRICUSPID VALVE MV Area (PHT): 4.31 cm    TR Peak grad:   16.8 mmHg MV Decel Time: 176 msec    TR Vmax:        205.00 cm/s MV E velocity: 86.50 cm/s MV A velocity: 78.80 cm/s  SHUNTS MV E/A ratio:  1.10        Systemic VTI:  0.15 m                            Systemic Diam: 2.00 cm Kathlyn Sacramento MD Electronically signed by Kathlyn Sacramento MD Signature Date/Time: 03/28/2020/4:54:38 PM    Final      CODE STATUS:     Code Status Orders  (From admission, onward)         Start     Ordered   03/27/20 0445  Do not attempt resuscitation (DNR)  Continuous       Question Answer Comment  In the event of cardiac or respiratory ARREST Do not call a "code blue"   In the event of cardiac or respiratory ARREST Do not perform Intubation, CPR, defibrillation or ACLS   In the event of cardiac or respiratory ARREST Use medication by any route, position, wound care, and other measures to relive pain and suffering. May  use oxygen, suction and manual treatment of airway obstruction as needed for comfort.      03/27/20 0445        Code Status History    Date Active Date Inactive Code Status Order ID Comments User Context   03/27/2020 0227 03/27/2020 0445 Full Code [id number]  Sidney Ace Arvella Merles, MD ED   05/24/2011 1711 05/26/2011 1419 Full Code [id number]  Conley Canal, RN Inpatient   Advance Care  Planning Activity    Advance Directive Documentation   Flowsheet Row Most Recent Value  Type of Advance Directive Living will  Pre-existing out of facility DNR order (yellow form or pink MOST form) --  "MOST" Form in Place? --       TOTAL TIME TAKING CARE OF THIS PATIENT: *35* minutes.    Kathryn [name] M.D  [facility]    CC: Primary care physician; Kathryn [name], MD

## 2020-03-29 NOTE — Progress Notes (Signed)
Progress Note  Patient Name: Kathryn [name] Date of Encounter: 03/29/2020  [facility] Cardiologist: Dossie Arbour, MD PhD  Subjective   Feels well (actually better than baseline).  No CP, shortness of breath, edema, or palpitations.  Inpatient Medications    Scheduled Meds: . carvedilol  3.125 mg Oral BID WC  . enoxaparin (LOVENOX) injection  0.5 mg/kg Subcutaneous Q24H  . furosemide  20 mg Oral Daily  . insulin aspart  0-9 Units Subcutaneous TID AC & HS  . levothyroxine  100 mcg Oral Q0600  . losartan  25 mg Oral QPM  . metFORMIN  1,000 mg Oral Q breakfast  . pantoprazole  40 mg Oral Daily  . tiotropium  18 mcg Inhalation Daily   Continuous Infusions:  PRN Meds: acetaminophen **OR** acetaminophen, albuterol, magnesium hydroxide, ondansetron **OR** ondansetron (ZOFRAN) IV, traZODone   Vital Signs    Vitals:   03/28/20 1805 03/28/20 2216 03/29/20 0403 03/29/20 0822  BP: 130/89 133/67 130/74 136/64  Pulse: 82 92 77 77  Resp: 17 17 19 18   Temp: 97.8 F (36.6 C) 98 F (36.7 C) 97.8 F (36.6 C) 97.7 F (36.5 C)  TempSrc: Oral Oral Oral Oral  SpO2: 97% 97% 97% 98%  Weight:   78.5 kg   Height:       No intake or output data in the 24 hours ending 03/29/20 0918 Last 3 Weights 03/29/2020 03/26/2020 03/24/2020  Weight (lbs) 173 lb 179 lb 179 lb  Weight (kg) 78.472 kg 81.194 kg 81.194 kg      Telemetry    NSR with PVC's.  No a-fib over last 24 hours. - Personally Reviewed  ECG    No new tracing.  Physical Exam   GEN: No acute distress.   Neck: No JVD Cardiac: RRR, no murmurs, rubs, or gallops.  Respiratory: Mildly diminished breath sounds throughout.  No wheezes or crackles. GI: Soft, nontender, non-distended  MS: No edema; No deformity. Neuro:  Nonfocal  Psych: Normal affect   Labs    High Sensitivity Troponin:   Recent Labs  Lab 03/24/20 0948 03/24/20 1211 03/26/20 2332 03/27/20 0139  TROPONINIHS 45* 45* 28* 31*      Chemistry Recent Labs   Lab 03/24/20 0948 03/26/20 2332 03/27/20 0435 03/28/20 2224  NA 139 138 139 138  K 4.6 3.6 4.5 3.9  CL 105 105 106 98  CO2 24 22 19* 30  GLUCOSE 163* 223* 237* 132*  BUN 14 18 19  25*  CREATININE 0.71 0.78 0.83 0.85  CALCIUM 10.0 9.5 9.0 9.6  PROT 6.8 7.4  --   --   ALBUMIN 3.6 3.8  --   --   AST 18 21  --   --   ALT 15 15  --   --   ALKPHOS 58 70  --   --   BILITOT 0.9 0.6  --   --   GFRNONAA >60 >60 >60 >60  ANIONGAP 10 11 14 10      Hematology Recent Labs  Lab 03/26/20 2332 03/27/20 0435 03/28/20 2224  WBC 8.1 7.7 9.2  RBC 4.39 3.90 4.43  HGB 11.1* 10.0* 11.1*  HCT 38.2 33.6* 37.7  MCV 87.0 86.2 85.1  MCH 25.3* 25.6* 25.1*  MCHC 29.1* 29.8* 29.4*  RDW 15.6* 15.7* 15.6*  PLT 273 208 270    BNP Recent Labs  Lab 03/26/20 2329  BNP 337.8*     DDimer No results for input(s): DDIMER in the last 168 hours.  Radiology    ECHOCARDIOGRAM COMPLETE  Result Date: 03/28/2020    ECHOCARDIOGRAM REPORT   Patient Name:   Kathryn [name] Date of Exam: 03/28/2020 Medical Rec #:  [id number]       Height:       64.0 in Accession #:    [id number]      Weight:       179.0 lb Date of Birth:  September 21, 1933       BSA:          1.866 m Patient Age:    6 years        BP:           102/53 mmHg Patient Gender: F               HR:           77 bpm. Exam Location:  [facility] Procedure: 2D Echo, Cardiac Doppler, Color Doppler and Strain Analysis Indications:     CHF-acute diastolic [id number]  History:         Patient has no prior history of Echocardiogram examinations.                  COPD, Signs/Symptoms:Shortness of Breath; Risk                  Factors:Hypertension and Diabetes.  Sonographer:     Sherrie Sport RDCS (AE) Referring Phys:  [place] Diagnosing Phys: Kathlyn Sacramento MD  Sonographer Comments: No parasternal window and no subcostal window. Global longitudinal strain was attempted. IMPRESSIONS  1. Left ventricular ejection fraction, by estimation, is 40 to 45%. The left ventricle has  mildly decreased function. Left ventricular endocardial border not optimally defined to evaluate regional wall motion. Left ventricular diastolic parameters are consistent with Grade II diastolic dysfunction (pseudonormalization). The global longitudinal strain is abnormal.  2. Right ventricular systolic function is normal. The right ventricular size is normal. Tricuspid regurgitation signal is inadequate for assessing PA pressure.  3. Left atrial size was mildly dilated.  4. The mitral valve is abnormal. Mild mitral valve regurgitation. No evidence of mitral stenosis.  5. The aortic valve is normal in structure. Aortic valve regurgitation is mild. Mild aortic valve sclerosis is present, with no evidence of aortic valve stenosis. FINDINGS  Left Ventricle: Left ventricular ejection fraction, by estimation, is 40 to 45%. The left ventricle has mildly decreased function. Left ventricular endocardial border not optimally defined to evaluate regional wall motion. The global longitudinal strain  is abnormal. The left ventricular internal cavity size was normal in size. There is no left ventricular hypertrophy. Left ventricular diastolic parameters are consistent with Grade II diastolic dysfunction (pseudonormalization). Right Ventricle: The right ventricular size is normal. No increase in right ventricular wall thickness. Right ventricular systolic function is normal. Tricuspid regurgitation signal is inadequate for assessing PA pressure. Left Atrium: Left atrial size was mildly dilated. Right Atrium: Right atrial size was normal in size. Pericardium: There is no evidence of pericardial effusion. Mitral Valve: The mitral valve is abnormal. There is moderate thickening of the mitral valve leaflet(s). Mild mitral annular calcification. Mild mitral valve regurgitation. No evidence of mitral valve stenosis. Tricuspid Valve: The tricuspid valve is normal in structure. Tricuspid valve regurgitation is not demonstrated. No  evidence of tricuspid stenosis. Aortic Valve: The aortic valve is normal in structure. Aortic valve regurgitation is mild. Mild aortic valve sclerosis is present, with no evidence of aortic valve stenosis. Aortic valve mean gradient measures 3.0 mmHg.  Aortic valve peak gradient measures 4.8 mmHg. Aortic valve area, by VTI measures 2.55 cm. Pulmonic Valve: The pulmonic valve was normal in structure. Pulmonic valve regurgitation is not visualized. No evidence of pulmonic stenosis. Aorta: The aortic root is normal in size and structure. Venous: The inferior vena cava was not well visualized. IAS/Shunts: No atrial level shunt detected by color flow Doppler.  LEFT VENTRICLE PLAX 2D LVIDd:         3.80 cm     Diastology LVIDs:         2.98 cm     LV e' medial:    5.22 cm/s LV PW:         1.13 cm     LV E/e' medial:  16.6 LV IVS:        0.83 cm     LV e' lateral:   4.35 cm/s LVOT diam:     2.00 cm     LV E/e' lateral: 19.9 LV SV:         47 LV SV Index:   25 LVOT Area:     3.14 cm  LV Volumes (MOD) LV vol d, MOD A2C: 99.4 ml LV vol d, MOD A4C: 59.2 ml LV vol s, MOD A2C: 57.2 ml LV vol s, MOD A4C: 37.2 ml LV SV MOD A2C:     42.2 ml LV SV MOD A4C:     59.2 ml LV SV MOD BP:      30.5 ml RIGHT VENTRICLE RV Basal diam:  2.91 cm RV S prime:     11.60 cm/s TAPSE (M-mode): 3.2 cm LEFT ATRIUM             Index       RIGHT ATRIUM           Index LA diam:        4.00 cm 2.14 cm/m  RA Area:     13.30 cm LA Vol (A2C):   55.5 ml 29.74 ml/m RA Volume:   31.60 ml  16.93 ml/m LA Vol (A4C):   39.1 ml 20.95 ml/m LA Biplane Vol: 50.7 ml 27.17 ml/m  AORTIC VALVE AV Area (Vmax):    2.08 cm AV Area (Vmean):   2.09 cm AV Area (VTI):     2.55 cm AV Vmax:           109.70 cm/s AV Vmean:          80.000 cm/s AV VTI:            0.185 m AV Peak Grad:      4.8 mmHg AV Mean Grad:      3.0 mmHg LVOT Vmax:         72.50 cm/s LVOT Vmean:        53.300 cm/s LVOT VTI:          0.150 m LVOT/AV VTI ratio: 0.81 MITRAL VALVE               TRICUSPID  VALVE MV Area (PHT): 4.31 cm    TR Peak grad:   16.8 mmHg MV Decel Time: 176 msec    TR Vmax:        205.00 cm/s MV E velocity: 86.50 cm/s MV A velocity: 78.80 cm/s  SHUNTS MV E/A ratio:  1.10        Systemic VTI:  0.15 m  Systemic Diam: 2.00 cm Kathlyn Sacramento MD Electronically signed by Kathlyn Sacramento MD Signature Date/Time: 03/28/2020/4:54:38 PM    Final     Cardiac Studies   See echo report above  Patient Profile     87 y.o. female with history of severe COPD, prior long history of tobacco use approximately 50 years, documented medication noncompliance, presenting with several days of worsening shortness of breath and leg edema.  Assessment & Plan    Acute HFrEF, uncertain etiology: Echo yesterday showed LVEF 40-45%.  Ms. Whisenant appears euvolemic and reports feeling even better than her baseline.  Transition to furosemide 20 mg PO daily.  Continue carvedilol 3.125 mg BID and losartan 25 mg QHS.  Plan for ischemia w/u as outpatient.  Questionable paroxysmal atrial fibrillation: Question of brief episodes of a-fib on telemetry earlier this admission.  No a-fib observed over last 24 hours.  Agree with placement of 14-day event monitor to evaluate for PAF after discharge.  It is reasonable to defer anticoagulation given very brief episodes of questionable a-fib.  Continue carvedilol.  [facility] will sign off.   Medication Recommendations:  Carvedilol 3.125 mg BID, furosemide 20 mg PO daily, and losartan 25 mg QHS. Other recommendations (labs, testing, etc): 14 day Zio XT at discharge. Follow up as an outpatient:  F/u with Dr. Rockey Situ or APP in 4-6 weeks.  For questions or updates, please contact [place] Please consult [email or web address] for contact info under [facility].  Signed, Nelva Bush, MD  03/29/2020, 9:18 AM

## 2020-03-29 NOTE — Progress Notes (Signed)
Mobility Specialist - Progress Note   03/29/20 1300  Mobility  Activity Ambulated in hall  Level of Assistance Standby assist, set-up cues, supervision of patient - no hands on  Assistive Device None  Distance Ambulated (ft) 180 ft  Mobility Response Tolerated well  Mobility performed by Mobility specialist  $Mobility charge 1 Mobility    Pre-mobility: 106 HR, 93% SpO2 Post-mobility: 120 HR, 92% SpO2   Pt was sitting EOB upon arrival utilizing room air. Pt agreed to session. Pt denied pain, nausea, or fatigue. Pt stood at bedside with no physical assistance. Pt ambulated 180' in room/hallway without AD. No LOB noted. After ~150', pt c/o feeling tired. Pt denied dizziness, weakness, or SOB throughout session. O2 > 91% throughout session. Overall, pt tolerated session well. Pt was left in bed with all needs in reach.    Filiberto Pinks Mobility Specialist 03/29/20, 1:55 PM

## 2020-03-29 NOTE — Progress Notes (Signed)
Discharge instructions explained to pt and pts son / verbalized an understanding/ iv and tele removed/ Zoll in place/ transported off unit via wheelchair.

## 2020-03-29 NOTE — TOC Progression Note (Addendum)
Transition of Care [facility]) - Progression Note    Patient Details  Name: Kathryn [name] MRN: [id number] Date of Birth: 1933/04/23  [facility] CM/SW Contact  Maree Krabbe, LCSW Phone Number: 03/29/2020, 11:37 AM  Clinical Narrative:   Per Charge RN pt is refusing HH. However, according to [place] with [facility] pt is agreeable to [facility] with [facility]. MD to order. CSW working remote.CSW unable to reach pt in the room.         Expected Discharge Plan and Services                                                 Social Determinants of Health (SDOH) Interventions    Readmission Risk Interventions No flowsheet data found.

## 2020-03-29 NOTE — Discharge Instructions (Signed)
Use your walker at home. °

## 2020-03-29 NOTE — Progress Notes (Signed)
SATURATION QUALIFICATIONS: (This note is used to comply with regulatory documentation for home oxygen)  Patient Saturations on Room Air at Rest = 95%  Patient Saturations on Room Air while Ambulating = >/=91%   

## 2020-03-30 ENCOUNTER — Telehealth: Payer: Self-pay

## 2020-03-30 DIAGNOSIS — I447 Left bundle-branch block, unspecified: Secondary | ICD-10-CM | POA: Diagnosis not present

## 2020-03-30 DIAGNOSIS — I471 Supraventricular tachycardia: Secondary | ICD-10-CM | POA: Diagnosis not present

## 2020-03-30 NOTE — Telephone Encounter (Signed)
Transition Care Management Unsuccessful Follow-up Telephone Call  Date of discharge and from where:  [facility] on 03/29/20.  Attempts:  1st Attempt  Reason for unsuccessful TCM follow-up call:  Left voice message.

## 2020-03-31 NOTE — Telephone Encounter (Signed)
Changed HFU to 04/05/20 @ 2:40 PM. Pt declined available morning apts. Please let me know if this needs to be changed. Thank you

## 2020-03-31 NOTE — Telephone Encounter (Signed)
48 hour after discharge phone call  Call to pt number, went directly to voice mail, left message that would call back.   Tresa Endo  RN, [facility]

## 2020-03-31 NOTE — Telephone Encounter (Signed)
Transition Care Management Unsuccessful Follow-up Telephone Call  Date of discharge and from where:  [facility] on 03/29/20  Attempts:  2nd Attempt  Reason for unsuccessful TCM follow-up call:  Left voice message

## 2020-03-31 NOTE — Telephone Encounter (Signed)
Transition Care Management Follow-up Telephone Call  Date of discharge and from where: [facility] on 03/29/20  How have you been since you were released from the hospital? Doing very well, has rested good since being home. Pt has encountered some SOB with movement but once stopped her breathing returns to normal. Pt is not having to use her nebulizer or inhaler. Pt states her appetite is normal and she has no complaints. Declines pain, fever, weakness, cough, congestion or n/v/d.  Any questions or concerns? No   Items Reviewed:  Did the pt receive and understand the discharge instructions provided? Yes   Medications obtained and verified? No, declined reviewing. Did verify new and stopped medications.  Any new allergies since your discharge? No   Dietary orders reviewed? Yes  Do you have support at home? Yes   Other (ie: DME, Home Health, etc): [facility] was ordered for a nursing.  Functional Questionnaire: (I = Independent and D = Dependent)  Bathing/Dressing- I   Meal Prep- I  Eating- I  Maintaining continence- I  Transferring/Ambulation- I, uses a walker outside of home.  Managing Meds- I   Follow up appointments reviewed:    PCP Hospital f/u appt confirmed? Yes  scheduled to see Dr Sullivan Lone on 04/13/20 @ 1:40 PM.  Specialist Hospital f/u appt confirmed? Yes    Are transportation arrangements needed? No   If their condition worsens, is the pt aware to call  their PCP or go to the ED? Yes  Was the patient provided with contact information for the PCP's office or ED? Yes  Was the pt encouraged to call back with questions or concerns? Yes

## 2020-03-31 NOTE — Telephone Encounter (Signed)
LMTCB to move HFU apt to any day before 04/12/20.

## 2020-03-31 NOTE — Telephone Encounter (Signed)
Please move it up a day or 2 -get inside the 2 weeks--thanks

## 2020-04-01 NOTE — Progress Notes (Signed)
Patient ID: Kathryn [name], female    DOB: 1933-10-24, 87 y.o.   MRN: [id number]  HPI  Ms Lafuente is a 87 y/o female with a history of atrial fibrillation, DM, hyperlipidemia, HTN, thyroid disease, PVD, gastric ulcer, COPD, previous tobacco use and chronic heart failure.   Echo report from 03/28/20 reviewed and showed an EF of 40-45% along with mild LAE and mild MR/ AR.   Admitted 03/26/20 due to new onset HF. Initially needed bipap but then able to be weaned down to 3L nasal cannula. Cardiology consult obtained. Initially given IV lasix with transition to oral diuretics. Discharged after 3 days.   She presents today for her initial visit with a chief complaint of moderate shortness of breath with minimal exertion. She describes this as having been present for several months with varying levels of severity. Had an "episode" this morning where she felt quite short of breath and pushed the button on her zio monitor that she's currently wearing. Once she sat down for a few minutes, her breathing improved. She has associated fatigue, palpitations, chronic pain, anxiety and difficulty sleeping along with this. She denies any dizziness, abdominal distention, pedal edema, chest pain, cough or weight gain.   Past Medical History:  Diagnosis Date  . Actinic keratosis   . Arthritis    spinal stenosis  . CHF (congestive heart failure) ([place])   . COPD (chronic obstructive pulmonary disease) ([place])   . Diabetes mellitus without complication ([place])   . Gastric ulcer 4/12   treated with Prolisec- states no problems now  . Hyperlipidemia   . Hypertension    PCP Dr Miguel Aschoff   [place]  . Hypothyroidism   . Left bundle branch block (LBBB)    atrial fibrillation  . Neuromuscular disorder ([place])    slight numbness right toes- comes and goes  . Peripheral vascular disease (HCC)    varicose veins left leg  . Pneumonia   . Seasonal allergies   . Shortness of breath   . Skin cancer    multiple from  face  . Squamous cell carcinoma of skin 03/23/2013   L dorsal hand  . Squamous cell carcinoma of skin 02/04/2014   R forearm/in situ, L pretibial  . Squamous cell carcinoma of skin 09/24/2018   R thumb   Past Surgical History:  Procedure Laterality Date  . BACK SURGERY     4/12  Dr Shellia Carwin- for lumbar stenosis  . BREAST CYST ASPIRATION Left 1965   negative  . BREAST SURGERY  1962   lumpectomy with biopsy   left  . CARPAL TUNNEL RELEASE     right  . COLONOSCOPY    . COLONOSCOPY WITH PROPOFOL N/A 06/30/2015   Procedure: COLONOSCOPY WITH PROPOFOL;  Surgeon: Hulen Luster, MD;  Location: [facility] ENDOSCOPY;  Service: Gastroenterology;  Laterality: N/A;  . COLONOSCOPY WITH PROPOFOL N/A 01/22/2017   Procedure: COLONOSCOPY WITH PROPOFOL;  Surgeon: Lollie Sails, MD;  Location: [facility] ENDOSCOPY;  Service: Endoscopy;  Laterality: N/A;  . COLONOSCOPY WITH PROPOFOL N/A 01/28/2019   Procedure: COLONOSCOPY WITH PROPOFOL;  Surgeon: Toledo, Benay Pike, MD;  Location: [facility] ENDOSCOPY;  Service: Gastroenterology;  Laterality: N/A;  . ESOPHAGOGASTRODUODENOSCOPY    . ESOPHAGOGASTRODUODENOSCOPY (EGD) WITH PROPOFOL N/A 01/28/2019   Procedure: ESOPHAGOGASTRODUODENOSCOPY (EGD) WITH PROPOFOL;  Surgeon: Toledo, Benay Pike, MD;  Location: [facility] ENDOSCOPY;  Service: Gastroenterology;  Laterality: N/A;  . EYE SURGERY     cataract extraction with IOL bilaterally  . JOINT REPLACEMENT    .  LUMBAR LAMINECTOMY/DECOMPRESSION MICRODISCECTOMY  05/24/2011   Procedure: LUMBAR LAMINECTOMY/DECOMPRESSION MICRODISCECTOMY 2 LEVELS;  Surgeon: Magnus Sinning, MD;  Location: [facility] ORS;  Service: Orthopedics;  Laterality: N/A;  L2-L3, L3-L4 (x-ray)  . RIGHT OOPHORECTOMY     due to STAPH INFECTION   Family History  Problem Relation Age of Onset  . Dementia Mother   . Lung cancer Father   . Heart disease Father   . Breast cancer Maternal Aunt   . Arthritis Son    Social History   Tobacco Use  . Smoking status: Former Smoker     Packs/day: 0.50    Years: 50.00    Pack years: 25.00    Types: Cigarettes    Quit date: 05/16/2004    Years since quitting: 15.8  . Smokeless tobacco: Never Used  Substance Use Topics  . Alcohol use: Yes    Comment: socially   Allergies  Allergen Reactions  . Oxycodone Other (See Comments)    Mental status change  . Prednisone     swelling, bruising.   Prior to Admission medications   Medication Sig Start Date End Date Taking? Authorizing Provider  acetaminophen (TYLENOL) 500 MG tablet Take 1,000 mg by mouth every 6 (six) hours as needed. Pain    Yes [provider]  albuterol (PROVENTIL) (2.5 MG/3ML) 0.083% nebulizer solution Take 3 mLs (2.5 mg total) by nebulization every 6 (six) hours as needed for wheezing or shortness of breath. 04/20/16  Yes Carmon Ginsberg, PA  carvedilol (COREG) 3.125 MG tablet Take 1 tablet (3.125 mg total) by mouth 2 (two) times daily with a meal. 03/29/20  Yes Fritzi Mandes, MD  furosemide (LASIX) 20 MG tablet Take 1 tablet (20 mg total) by mouth daily. 03/30/20  Yes Fritzi Mandes, MD  glucose blood (CONTOUR NEXT TEST) test strip Check sugar once daily  DX E11.9 11/16/16  Yes Jerrol Banana., MD  hydrOXYzine (ATARAX/VISTARIL) 10 MG tablet TAKE 1 TO 2 TABLETS(10 TO 20 MG) BY MOUTH EVERY 6 HOURS AS NEEDED 09/16/19  Yes Jerrol Banana., MD  levothyroxine (SYNTHROID) 100 MCG tablet TAKE 1 TABLET(100 MCG) BY MOUTH DAILY BEFORE BREAKFAST 08/17/19  Yes Jerrol Banana., MD  losartan (COZAAR) 25 MG tablet Take 1 tablet (25 mg total) by mouth every evening. 03/29/20  Yes Fritzi Mandes, MD  metFORMIN (GLUCOPHAGE) 1000 MG tablet TAKE 1 TABLET(1000 MG) BY MOUTH DAILY WITH SUPPER 03/03/20  Yes Jerrol Banana., MD  omeprazole (PRILOSEC) 20 MG capsule TAKE 1 CAPSULE(20 MG) BY MOUTH DAILY 03/03/20  Yes Jerrol Banana., MD  tiotropium [facility]) 18 MCG inhalation capsule Place 18 mcg into inhaler and inhale daily as needed.   Yes [provider]    Review of Systems  Constitutional: Positive for fatigue. Negative for appetite change.  HENT: Negative for congestion, rhinorrhea and sore throat.   Eyes: Negative.   Respiratory: Positive for shortness of breath (easily). Negative for cough and chest tightness.   Cardiovascular: Positive for palpitations. Negative for chest pain and leg swelling.  Gastrointestinal: Negative for abdominal distention and abdominal pain.  Endocrine: Negative.   Genitourinary: Negative.   Musculoskeletal: Positive for arthralgias (shoulders) and back pain.  Skin: Negative.   Allergic/Immunologic: Negative.   Neurological: Negative for dizziness and light-headedness.  Hematological: Negative for adenopathy. Does not bruise/bleed easily.  Psychiatric/Behavioral: Positive for sleep disturbance (sleeping on 1-2 pillows). Negative for dysphoric mood. The patient is nervous/anxious.     Vitals:  04/04/20 0902  BP: 132/75  Pulse: 85  Resp: 18  SpO2: 100%  Weight: 172 lb (78 kg)  Height: 5\' 4"  (1.626 m)   Wt Readings from Last 3 Encounters:  04/04/20 172 lb (78 kg)  03/29/20 173 lb (78.5 kg)  03/24/20 179 lb (81.2 kg)   Lab Results  Component Value Date   CREATININE 0.85 03/28/2020   CREATININE 0.83 03/27/2020   CREATININE 0.78 03/26/2020   Physical Exam Vitals and nursing note reviewed. Exam conducted with a chaperone present (daughter present).  Constitutional:      Appearance: She is well-developed.  HENT:     Head: Normocephalic and atraumatic.  Neck:     Vascular: No JVD.  Cardiovascular:     Rate and Rhythm: Normal rate and regular rhythm.  Pulmonary:     Effort: Pulmonary effort is normal. No respiratory distress.     Breath sounds: No wheezing or rales.  Abdominal:     Palpations: Abdomen is soft.     Tenderness: There is no abdominal tenderness.  Musculoskeletal:     Cervical back: Neck supple.     Right lower leg: No tenderness. No edema.     Left lower leg: No  tenderness. No edema.  Skin:    General: Skin is warm and dry.  Neurological:     General: No focal deficit present.     Mental Status: She is alert and oriented to person, place, and time.  Psychiatric:        Mood and Affect: Mood normal.        Behavior: Behavior normal.    Assessment & Plan:  1: Chronic heart failure with reduced ejection fraction- - NYHA class III - euvolemic today - weighing daily and reminded to call for an overnight weight gain of > 2 pounds or a weekly weight gain of >5 pounds - not adding much salt now but admits to "loving salt"; has been trying to limit adding salt as well as looking at food labels for sodium content; DIL with her says that she reads food labels as well - reviewed medication uses and explained that the goal would be to titrate up her carvedilol/ losartan (or switch to entresto) as well as add spironolactone/ farxiga. Since she's only been on carvedilol & losartan ~ 10 days, will not adjust anything today - currently wearing zio monitor - seeing [facility] cardiology [facility]) 04/12/20 - BNP 03/26/20 was 337.8 - reports receiving 3 covid vaccines (including booster) - has received her flu vaccine for this season  2: HTN- - BP looks good today - saw PCP 05/24/20) 01/19/20 & returns tomorrow - BMP 03/28/20 reviewed and showed sodium 138, potassium 3.9, creatinine 0.85 and GFR >60  3: Diabetes- - A1c 03/27/20 was 6.9%  4: COPD-  - has spiriva that she uses PRN; explained the use of this inhaler and that she should use it daily - has nebulizer for PRN use but she says that she doesn't like using it because it makes her jittery; explained that is a normal side effect from the steroid that is in it as well as explained the use of the nebulizer - previous long-term smoker   Medication bottles reviewed.   Return in 6 weeks or sooner for any questions/problems before then.

## 2020-04-02 DIAGNOSIS — J302 Other seasonal allergic rhinitis: Secondary | ICD-10-CM | POA: Diagnosis not present

## 2020-04-02 DIAGNOSIS — E785 Hyperlipidemia, unspecified: Secondary | ICD-10-CM | POA: Diagnosis not present

## 2020-04-02 DIAGNOSIS — Z85828 Personal history of other malignant neoplasm of skin: Secondary | ICD-10-CM | POA: Diagnosis not present

## 2020-04-02 DIAGNOSIS — E039 Hypothyroidism, unspecified: Secondary | ICD-10-CM | POA: Diagnosis not present

## 2020-04-02 DIAGNOSIS — I447 Left bundle-branch block, unspecified: Secondary | ICD-10-CM | POA: Diagnosis not present

## 2020-04-02 DIAGNOSIS — Z7984 Long term (current) use of oral hypoglycemic drugs: Secondary | ICD-10-CM | POA: Diagnosis not present

## 2020-04-02 DIAGNOSIS — K219 Gastro-esophageal reflux disease without esophagitis: Secondary | ICD-10-CM | POA: Diagnosis not present

## 2020-04-02 DIAGNOSIS — Z79891 Long term (current) use of opiate analgesic: Secondary | ICD-10-CM | POA: Diagnosis not present

## 2020-04-02 DIAGNOSIS — Z8701 Personal history of pneumonia (recurrent): Secondary | ICD-10-CM | POA: Diagnosis not present

## 2020-04-02 DIAGNOSIS — M4800 Spinal stenosis, site unspecified: Secondary | ICD-10-CM | POA: Diagnosis not present

## 2020-04-02 DIAGNOSIS — G709 Myoneural disorder, unspecified: Secondary | ICD-10-CM | POA: Diagnosis not present

## 2020-04-02 DIAGNOSIS — Z9181 History of falling: Secondary | ICD-10-CM | POA: Diagnosis not present

## 2020-04-02 DIAGNOSIS — J9601 Acute respiratory failure with hypoxia: Secondary | ICD-10-CM | POA: Diagnosis not present

## 2020-04-02 DIAGNOSIS — I110 Hypertensive heart disease with heart failure: Secondary | ICD-10-CM | POA: Diagnosis not present

## 2020-04-02 DIAGNOSIS — E1151 Type 2 diabetes mellitus with diabetic peripheral angiopathy without gangrene: Secondary | ICD-10-CM | POA: Diagnosis not present

## 2020-04-02 DIAGNOSIS — K259 Gastric ulcer, unspecified as acute or chronic, without hemorrhage or perforation: Secondary | ICD-10-CM | POA: Diagnosis not present

## 2020-04-02 DIAGNOSIS — Z87891 Personal history of nicotine dependence: Secondary | ICD-10-CM | POA: Diagnosis not present

## 2020-04-02 DIAGNOSIS — Z90721 Acquired absence of ovaries, unilateral: Secondary | ICD-10-CM | POA: Diagnosis not present

## 2020-04-02 DIAGNOSIS — L570 Actinic keratosis: Secondary | ICD-10-CM | POA: Diagnosis not present

## 2020-04-02 DIAGNOSIS — J449 Chronic obstructive pulmonary disease, unspecified: Secondary | ICD-10-CM | POA: Diagnosis not present

## 2020-04-02 DIAGNOSIS — I5021 Acute systolic (congestive) heart failure: Secondary | ICD-10-CM | POA: Diagnosis not present

## 2020-04-04 ENCOUNTER — Encounter: Payer: Self-pay | Admitting: Family

## 2020-04-04 ENCOUNTER — Ambulatory Visit: Payer: Medicare Other | Attending: Family | Admitting: Family

## 2020-04-04 ENCOUNTER — Telehealth: Payer: Self-pay | Admitting: Family Medicine

## 2020-04-04 ENCOUNTER — Other Ambulatory Visit: Payer: Self-pay

## 2020-04-04 VITALS — BP 132/75 | HR 85 | Resp 18 | Ht 64.0 in | Wt 172.0 lb

## 2020-04-04 DIAGNOSIS — Z888 Allergy status to other drugs, medicaments and biological substances status: Secondary | ICD-10-CM | POA: Diagnosis not present

## 2020-04-04 DIAGNOSIS — E119 Type 2 diabetes mellitus without complications: Secondary | ICD-10-CM

## 2020-04-04 DIAGNOSIS — Z87891 Personal history of nicotine dependence: Secondary | ICD-10-CM | POA: Diagnosis not present

## 2020-04-04 DIAGNOSIS — I4819 Other persistent atrial fibrillation: Secondary | ICD-10-CM | POA: Insufficient documentation

## 2020-04-04 DIAGNOSIS — Z8711 Personal history of peptic ulcer disease: Secondary | ICD-10-CM | POA: Diagnosis not present

## 2020-04-04 DIAGNOSIS — Z885 Allergy status to narcotic agent status: Secondary | ICD-10-CM | POA: Insufficient documentation

## 2020-04-04 DIAGNOSIS — E1151 Type 2 diabetes mellitus with diabetic peripheral angiopathy without gangrene: Secondary | ICD-10-CM | POA: Diagnosis not present

## 2020-04-04 DIAGNOSIS — I10 Essential (primary) hypertension: Secondary | ICD-10-CM

## 2020-04-04 DIAGNOSIS — E039 Hypothyroidism, unspecified: Secondary | ICD-10-CM | POA: Insufficient documentation

## 2020-04-04 DIAGNOSIS — I110 Hypertensive heart disease with heart failure: Secondary | ICD-10-CM | POA: Diagnosis not present

## 2020-04-04 DIAGNOSIS — J449 Chronic obstructive pulmonary disease, unspecified: Secondary | ICD-10-CM | POA: Diagnosis not present

## 2020-04-04 DIAGNOSIS — E785 Hyperlipidemia, unspecified: Secondary | ICD-10-CM | POA: Insufficient documentation

## 2020-04-04 DIAGNOSIS — Z7984 Long term (current) use of oral hypoglycemic drugs: Secondary | ICD-10-CM | POA: Insufficient documentation

## 2020-04-04 DIAGNOSIS — Z90721 Acquired absence of ovaries, unilateral: Secondary | ICD-10-CM | POA: Insufficient documentation

## 2020-04-04 DIAGNOSIS — I5022 Chronic systolic (congestive) heart failure: Secondary | ICD-10-CM | POA: Diagnosis not present

## 2020-04-04 DIAGNOSIS — Z79890 Hormone replacement therapy (postmenopausal): Secondary | ICD-10-CM | POA: Insufficient documentation

## 2020-04-04 DIAGNOSIS — Z79899 Other long term (current) drug therapy: Secondary | ICD-10-CM | POA: Diagnosis not present

## 2020-04-04 DIAGNOSIS — Z8249 Family history of ischemic heart disease and other diseases of the circulatory system: Secondary | ICD-10-CM | POA: Diagnosis not present

## 2020-04-04 NOTE — Telephone Encounter (Signed)
Home Health Verbal Orders - Caller/Agency: Gara Kroner / [place] Number: [phone] Requesting Skilled Nursing Frequency: 1w1 2w2 1w6 2prn as needed, PTE bath

## 2020-04-04 NOTE — Patient Instructions (Addendum)
Continue weighing daily and call for an overnight weight gain of > 2 pounds or a weekly weight gain of >5 pounds.    Low-Sodium Eating Plan Sodium, which is an element that makes up salt, helps you maintain a healthy balance of fluids in your body. Too much sodium can increase your blood pressure and cause fluid and waste to be held in your body. Your health care provider or dietitian may recommend following this plan if you have high blood pressure (hypertension), kidney disease, liver disease, or heart failure. Eating less sodium can help lower your blood pressure, reduce swelling, and protect your heart, liver, and kidneys. What are tips for following this plan? Reading food labels  The Nutrition Facts label lists the amount of sodium in one serving of the food. If you eat more than one serving, you must multiply the listed amount of sodium by the number of servings.  Choose foods with less than 140 mg of sodium per serving.  Avoid foods with 300 mg of sodium or more per serving. Shopping  Look for lower-sodium products, often labeled as "low-sodium" or "no salt added."  Always check the sodium content, even if foods are labeled as "unsalted" or "no salt added."  Buy fresh foods. ? Avoid canned foods and pre-made or frozen meals. ? Avoid canned, cured, or processed meats.  Buy breads that have less than 80 mg of sodium per slice.   Cooking  Eat more home-cooked food and less restaurant, buffet, and fast food.  Avoid adding salt when cooking. Use salt-free seasonings or herbs instead of table salt or sea salt. Check with your health care provider or pharmacist before using salt substitutes.  Cook with plant-based oils, such as canola, sunflower, or olive oil.   Meal planning  When eating at a restaurant, ask that your food be prepared with less salt or no salt, if possible. Avoid dishes labeled as brined, pickled, cured, smoked, or made with soy sauce, miso, or teriyaki  sauce.  Avoid foods that contain MSG (monosodium glutamate). MSG is sometimes added to Chinese food, bouillon, and some canned foods.  Make meals that can be grilled, baked, poached, roasted, or steamed. These are generally made with less sodium. General information Most people on this plan should limit their sodium intake to 1,500-2,000 mg (milligrams) of sodium each day. What foods should I eat? Fruits Fresh, frozen, or canned fruit. Fruit juice. Vegetables Fresh or frozen vegetables. "No salt added" canned vegetables. "No salt added" tomato sauce and paste. Low-sodium or reduced-sodium tomato and vegetable juice. Grains Low-sodium cereals, including oats, puffed wheat and rice, and shredded wheat. Low-sodium crackers. Unsalted rice. Unsalted pasta. Low-sodium bread. Whole-grain breads and whole-grain pasta. Meats and other proteins Fresh or frozen (no salt added) meat, poultry, seafood, and fish. Low-sodium canned tuna and salmon. Unsalted nuts. Dried peas, beans, and lentils without added salt. Unsalted canned beans. Eggs. Unsalted nut butters. Dairy Milk. Soy milk. Cheese that is naturally low in sodium, such as ricotta cheese, fresh mozzarella, or Swiss cheese. Low-sodium or reduced-sodium cheese. Cream cheese. Yogurt. Seasonings and condiments Fresh and dried herbs and spices. Salt-free seasonings. Low-sodium mustard and ketchup. Sodium-free salad dressing. Sodium-free light mayonnaise. Fresh or refrigerated horseradish. Lemon juice. Vinegar. Other foods Homemade, reduced-sodium, or low-sodium soups. Unsalted popcorn and pretzels. Low-salt or salt-free chips. The items listed above may not be a complete list of foods and beverages you can eat. Contact a dietitian for more information. What foods should I avoid? Vegetables   pickled vegetables, and relishes. Olives. [place] fries. Onion rings. Regular canned vegetables (not low-sodium or reduced-sodium). Regular canned tomato  sauce and paste (not low-sodium or reduced-sodium). Regular tomato and vegetable juice (not low-sodium or reduced-sodium). Frozen vegetables in sauces. Grains Instant hot cereals. Bread stuffing, pancake, and biscuit mixes. Croutons. Seasoned rice or pasta mixes. Noodle soup cups. Boxed or frozen macaroni and cheese. Regular salted crackers. Self-rising flour. Meats and other proteins Meat or fish that is salted, canned, smoked, spiced, or pickled. Precooked or cured meat, such as sausages or meat loaves. Berniece Salines. Ham. Pepperoni. Hot dogs. Corned beef. Chipped beef. Salt pork. Jerky. Pickled herring. Anchovies and sardines. Regular canned tuna. Salted nuts. Dairy Processed cheese and cheese spreads. Hard cheeses. Cheese curds. Blue cheese. Feta cheese. String cheese. Regular cottage cheese. Buttermilk. Canned milk. Fats and oils Salted butter. Regular margarine. Ghee. Bacon fat. Seasonings and condiments Onion salt, garlic salt, seasoned salt, table salt, and sea salt. Canned and packaged gravies. Worcestershire sauce. Tartar sauce. Barbecue sauce. Teriyaki sauce. Soy sauce, including reduced-sodium. Steak sauce. Fish sauce. Oyster sauce. Cocktail sauce. Horseradish that you find on the shelf. Regular ketchup and mustard. Meat flavorings and tenderizers. Bouillon cubes. Hot sauce. Pre-made or packaged marinades. Pre-made or packaged taco seasonings. Relishes. Regular salad dressings. Salsa. Other foods Salted popcorn and pretzels. Corn chips and puffs. Potato and tortilla chips. Canned or dried soups. Pizza. Frozen entrees and pot pies. The items listed above may not be a complete list of foods and beverages you should avoid. Contact a dietitian for more information. Summary  Eating less sodium can help lower your blood pressure, reduce swelling, and protect your heart, liver, and kidneys.  Most people on this plan should limit their sodium intake to 1,500-2,000 mg (milligrams) of sodium each  day.  Canned, boxed, and frozen foods are high in sodium. Restaurant foods, fast foods, and pizza are also very high in sodium. You also get sodium by adding salt to food.  Try to cook at home, eat more fresh fruits and vegetables, and eat less fast food and canned, processed, or prepared foods. This information is not intended to replace advice given to you by your health care provider. Make sure you discuss any questions you have with your health care provider. Document Revised: 04/17/2019 Document Reviewed: 02/11/2019 Elsevier Patient Education  2021 [employer].

## 2020-04-05 ENCOUNTER — Ambulatory Visit (INDEPENDENT_AMBULATORY_CARE_PROVIDER_SITE_OTHER): Payer: Medicare Other | Admitting: Family Medicine

## 2020-04-05 ENCOUNTER — Other Ambulatory Visit: Payer: Self-pay

## 2020-04-05 ENCOUNTER — Other Ambulatory Visit: Payer: Self-pay | Admitting: Family Medicine

## 2020-04-05 ENCOUNTER — Encounter: Payer: Self-pay | Admitting: Family Medicine

## 2020-04-05 DIAGNOSIS — M5116 Intervertebral disc disorders with radiculopathy, lumbar region: Secondary | ICD-10-CM | POA: Diagnosis not present

## 2020-04-05 DIAGNOSIS — J9601 Acute respiratory failure with hypoxia: Secondary | ICD-10-CM

## 2020-04-05 DIAGNOSIS — M4726 Other spondylosis with radiculopathy, lumbar region: Secondary | ICD-10-CM

## 2020-04-05 DIAGNOSIS — E119 Type 2 diabetes mellitus without complications: Secondary | ICD-10-CM

## 2020-04-05 DIAGNOSIS — I5021 Acute systolic (congestive) heart failure: Secondary | ICD-10-CM | POA: Diagnosis not present

## 2020-04-05 DIAGNOSIS — J069 Acute upper respiratory infection, unspecified: Secondary | ICD-10-CM

## 2020-04-05 DIAGNOSIS — E7800 Pure hypercholesterolemia, unspecified: Secondary | ICD-10-CM | POA: Diagnosis not present

## 2020-04-05 DIAGNOSIS — J449 Chronic obstructive pulmonary disease, unspecified: Secondary | ICD-10-CM

## 2020-04-05 DIAGNOSIS — M1990 Unspecified osteoarthritis, unspecified site: Secondary | ICD-10-CM | POA: Diagnosis not present

## 2020-04-05 DIAGNOSIS — E039 Hypothyroidism, unspecified: Secondary | ICD-10-CM | POA: Diagnosis not present

## 2020-04-05 MED ORDER — ALBUTEROL SULFATE (2.5 MG/3ML) 0.083% IN NEBU: 2.5000 mg | INHALATION_SOLUTION | Freq: Four times a day (QID) | RESPIRATORY_TRACT | 12 refills | Status: DC | PRN

## 2020-04-05 MED ORDER — TIOTROPIUM BROMIDE MONOHYDRATE 18 MCG IN CAPS: 18.0000 ug | ORAL_CAPSULE | Freq: Every day | RESPIRATORY_TRACT | 12 refills | Status: DC | PRN

## 2020-04-05 NOTE — Telephone Encounter (Signed)
ok 

## 2020-04-05 NOTE — Telephone Encounter (Signed)
Please advise. Thanks.  

## 2020-04-05 NOTE — Patient Instructions (Signed)
At Home Instructions:  1. Take Spiriva daily   2. Take Albuterol every 6hrs as needed

## 2020-04-05 NOTE — Progress Notes (Signed)
Established patient visit   Patient: Kathryn [name]   DOB: 31-Jan-1934   87 y.o. Female  MRN: [id number] Visit Date: 04/05/2020  Today's healthcare provider: [name], MD   Chief Complaint  Patient presents with  . Hospitalization Follow-up   Subjective    HPI  Follow up Hospitalization This is a transition of care visit for hospitalization for CHF. She is feeling much better. In addition to treatment for DHF COPD has been treated with Spiriva and albuterol. I think she probably needs to stay on all treatment. She continues to have chronic pain but her breathing is better. She has been seen at the heart failure clinic yesterday. Patient was admitted to [facility] on 03/26/20 and discharged on 03/29/20 . She was treated for acute CHF and dyspnea. Treatment for this included IV of Lasix and Bipap.Follow up with cardiology was scheduled 04/04/20 Telephone follow up was done on 03/30/20 She reports excellent compliance with treatment. She reports this condition is improved.  ----------------------------------------------------------------------------------------- -      Medications: Outpatient Medications Prior to Visit  Medication Sig  . acetaminophen (TYLENOL) 500 MG tablet Take 1,000 mg by mouth every 6 (six) hours as needed. Pain   . albuterol (PROVENTIL) (2.5 MG/3ML) 0.083% nebulizer solution Take 3 mLs (2.5 mg total) by nebulization every 6 (six) hours as needed for wheezing or shortness of breath.  . carvedilol (COREG) 3.125 MG tablet Take 1 tablet (3.125 mg total) by mouth 2 (two) times daily with a meal.  . furosemide (LASIX) 20 MG tablet Take 1 tablet (20 mg total) by mouth daily.  Marland Kitchen glucose blood (CONTOUR NEXT TEST) test strip Check sugar once daily  DX E11.9  . hydrOXYzine (ATARAX/VISTARIL) 10 MG tablet TAKE 1 TO 2 TABLETS(10 TO 20 MG) BY MOUTH EVERY 6 HOURS AS NEEDED  . levothyroxine (SYNTHROID) 100 MCG tablet TAKE 1 TABLET(100 MCG) BY MOUTH DAILY BEFORE BREAKFAST   . losartan (COZAAR) 25 MG tablet Take 1 tablet (25 mg total) by mouth every evening.  . metFORMIN (GLUCOPHAGE) 1000 MG tablet TAKE 1 TABLET(1000 MG) BY MOUTH DAILY WITH SUPPER  . omeprazole (PRILOSEC) 20 MG capsule TAKE 1 CAPSULE(20 MG) BY MOUTH DAILY  . tiotropium (SPIRIVA) 18 MCG inhalation capsule Place 18 mcg into inhaler and inhale daily as needed.   No facility-administered medications prior to visit.    Review of Systems  Last hemoglobin A1c Lab Results  Component Value Date   HGBA1C 6.9 (H) 03/27/2020      Objective    There were no vitals taken for this visit. BP Readings from Last 3 Encounters:  04/04/20 132/75  03/29/20 130/72  03/24/20 (!) 155/78   Wt Readings from Last 3 Encounters:  04/04/20 172 lb (78 kg)  03/29/20 173 lb (78.5 kg)  03/24/20 179 lb (81.2 kg)      Physical Exam Vitals and nursing note reviewed.  Constitutional:      Appearance: Normal appearance. She is normal weight.  HENT:     Right Ear: Tympanic membrane normal.     Left Ear: Tympanic membrane normal.     Nose: Nose normal.     Mouth/Throat:     Mouth: Mucous membranes are moist.     Pharynx: Oropharynx is clear.  Eyes:     General: No scleral icterus.    Conjunctiva/sclera: Conjunctivae normal.  Cardiovascular:     Rate and Rhythm: Normal rate and regular rhythm.     Pulses: Normal pulses.  Heart sounds: Normal heart sounds.  Pulmonary:     Effort: Pulmonary effort is normal.     Breath sounds: Normal breath sounds.  Abdominal:     General: Bowel sounds are normal.     Palpations: Abdomen is soft.  Musculoskeletal:     Cervical back: Normal range of motion and neck supple.     Comments: Trace. pedal edema in ankles  Skin:    General: Skin is warm and dry.  Neurological:     Mental Status: She is alert.  Psychiatric:        Mood and Affect: Mood normal.        Behavior: Behavior normal.        Thought Content: Thought content normal.        Judgment: Judgment  normal.       No results found for any visits on 04/05/20.  Assessment & Plan     1. Acute systolic congestive heart failure (HCC) Clinically resolved..  Follow-up with heart failure clinic.  Patient to weigh daily. She is also been educated on [place].  2. Acute respiratory failure with hypoxia ([place]) Call for treatment of heart failure.  3. COPD, mild (HCC) Refill Spiriva daily and albuterol every 6 hours as needed.  4. Type 2 diabetes mellitus without complication, without long-term current use of insulin ([place])   5. Hypothyroidism, unspecified type   6. Lumbar disc disease with radiculopathy   7. Osteoarthritis of spine with radiculopathy, lumbar region On a pain from these problems.  8. Osteoarthritis, unspecified osteoarthritis type, unspecified site   9. Pure hypercholesterolemia   10. Viral upper respiratory tract infection  - albuterol (PROVENTIL) (2.5 MG/3ML) 0.083% nebulizer solution; Take 3 mLs (2.5 mg total) by nebulization every 6 (six) hours as needed for wheezing or shortness of breath.  Dispense: 150 mL; Refill: 12  No follow-ups on file.      I, [name], MD, have reviewed all documentation for this visit. The documentation on 04/10/20 for the exam, diagnosis, procedures, and orders are all accurate and complete.    Morrissa Shein [place] Mon, MD  [facility] [phone] (phone) [phone] (fax)  [place]

## 2020-04-08 ENCOUNTER — Telehealth: Payer: Self-pay

## 2020-04-08 NOTE — Telephone Encounter (Signed)
Copied from [place] [id number]. Topic: Quick Communication - Home Health Verbal Orders >> Apr 08, 2020 12:18 PM Gillis Ends D wrote: Caller/Agency: Tiffany @[facility]  [place] Number: [phone] option 2 Requesting OT/PT/Skilled Nursing/Social Work/Speech Therapy: Skilled Nursing Frequency: One week 1, 2 week 2 ,one week 6

## 2020-04-09 NOTE — Telephone Encounter (Signed)
ok 

## 2020-04-12 ENCOUNTER — Telehealth (INDEPENDENT_AMBULATORY_CARE_PROVIDER_SITE_OTHER): Payer: Medicare Other | Admitting: Physician Assistant

## 2020-04-12 ENCOUNTER — Telehealth: Payer: Self-pay | Admitting: Physician Assistant

## 2020-04-12 ENCOUNTER — Encounter: Payer: Self-pay | Admitting: Physician Assistant

## 2020-04-12 VITALS — Ht 64.0 in | Wt 172.0 lb

## 2020-04-12 DIAGNOSIS — E119 Type 2 diabetes mellitus without complications: Secondary | ICD-10-CM

## 2020-04-12 DIAGNOSIS — Z79899 Other long term (current) drug therapy: Secondary | ICD-10-CM

## 2020-04-12 DIAGNOSIS — R000 Tachycardia, unspecified: Secondary | ICD-10-CM

## 2020-04-12 DIAGNOSIS — I447 Left bundle-branch block, unspecified: Secondary | ICD-10-CM

## 2020-04-12 DIAGNOSIS — Z87891 Personal history of nicotine dependence: Secondary | ICD-10-CM

## 2020-04-12 DIAGNOSIS — I10 Essential (primary) hypertension: Secondary | ICD-10-CM

## 2020-04-12 DIAGNOSIS — I5023 Acute on chronic systolic (congestive) heart failure: Secondary | ICD-10-CM | POA: Diagnosis not present

## 2020-04-12 DIAGNOSIS — J449 Chronic obstructive pulmonary disease, unspecified: Secondary | ICD-10-CM

## 2020-04-12 DIAGNOSIS — I471 Supraventricular tachycardia: Secondary | ICD-10-CM

## 2020-04-12 DIAGNOSIS — R0602 Shortness of breath: Secondary | ICD-10-CM | POA: Diagnosis not present

## 2020-04-12 DIAGNOSIS — R079 Chest pain, unspecified: Secondary | ICD-10-CM | POA: Diagnosis not present

## 2020-04-12 DIAGNOSIS — E7800 Pure hypercholesterolemia, unspecified: Secondary | ICD-10-CM

## 2020-04-12 MED ORDER — ROSUVASTATIN CALCIUM 5 MG PO TABS: 5.0000 mg | ORAL_TABLET | Freq: Every day | ORAL | 3 refills | Status: DC

## 2020-04-12 MED ORDER — FUROSEMIDE 20 MG PO TABS: 20.0000 mg | ORAL_TABLET | Freq: Every day | ORAL | 5 refills | Status: DC

## 2020-04-12 NOTE — Patient Instructions (Addendum)
Medication Instructions:  Your physician has recommended you make the following change in your medication:   1)  TAKE Lasix 40mg  (2 tablets of current dose Lasix 20mg ) for THREE days      THEN, resume previous dose Lasix 20mg  daily    -  I have sent in a refill for your Lasix to [facility] in [place]   2)  START Rosuvastatin (Crestor) 5mg  daily - An Rx has been sent to [facility] in [place]   *If you need a refill on your cardiac medications before your next appointment, please call your pharmacy*   Lab Work:  1)  Your physician recommends that you have lab work TODAY (Or tomorrow when you are able to drive safely) at the medical mall: CBC, Bmet -  Please go to the [facility]. You will check in at the front desk to the right as you walk into the atrium. Valet Parking is offered if needed. - No appointment needed. You may go any day between 7 am and 6 pm.  2) Your physician recommends that you return for lab work to the medical mall in: [place] may arrive at the medical mall prior to your 1 week follow up appointment to have these labs drawn: CBC, Bmet -  Please go to the [facility]. You will check in at the front desk to the right as you walk into the atrium. Valet Parking is offered if needed. - No appointment needed. You may go any day between 7 am and 6 pm.  3)  Your physician recommends that you return for FASTING lab work at the medical mall in: 2 months - Lipid / Liver panel -  Please go to the [facility]. You will check in at the front desk to the right as you walk into the atrium. Valet Parking is offered if needed. - No appointment needed. You may go any day between 7 am and 6 pm.    Testing/Procedures: [place]  Your caregiver has ordered a Stress Test with nuclear imaging. The purpose of this test is to evaluate the blood supply to your heart muscle. This procedure is referred to as a "Non-Invasive Stress Test." This is because other than  having an IV started in your vein, nothing is inserted or "invades" your body. Cardiac stress tests are done to find areas of poor blood flow to the heart by determining the extent of coronary artery disease (CAD). Some patients exercise on a treadmill, which naturally increases the blood flow to your heart, while others who are  unable to walk on a treadmill due to physical limitations have a pharmacologic/chemical stress agent called Lexiscan . This medicine will mimic walking on a treadmill by temporarily increasing your coronary blood flow.   Please note: these test may take anywhere between 2-4 hours to complete  PLEASE REPORT TO [place] AT THE FIRST DESK WILL DIRECT YOU WHERE TO GO  Date of Procedure:_____________________________________  Arrival Time for Procedure:______________________________  Instructions regarding medication:   __X__ : Hold diabetes medication morning of procedure (METFORMIN)  __X__:  Hold betablocker(s) night before procedure and morning of procedure (CARVEDILOL/ COREG)   PLEASE NOTIFY THE OFFICE AT LEAST 24 HOURS IN ADVANCE IF YOU ARE UNABLE TO KEEP YOUR APPOINTMENT.  [phone] AND  PLEASE NOTIFY NUCLEAR MEDICINE AT [facility] AT LEAST 24 HOURS IN ADVANCE IF YOU ARE UNABLE TO KEEP YOUR APPOINTMENT. [phone]  How to prepare for your Myoview test:  1. Do not eat or drink after midnight 2. No caffeine for 24 hours prior to test 3. No smoking 24 hours prior to test. 4. Your medication may be taken with water.  If your doctor stopped a medication because of this test, do not take that medication. 5. Ladies, please do not wear dresses.  Skirts or pants are appropriate. Please wear a short sleeve shirt. 6. No perfume, cologne or lotion. 7. Wear comfortable walking shoes. No heels!    Follow-Up: At [facility], you and your health needs are our priority.  As part of our continuing mission to provide you with exceptional  heart care, we have created designated Provider Care Teams.  These Care Teams include your primary Cardiologist (physician) and Advanced Practice Providers (APPs -  Physician Assistants and Nurse Practitioners) who all work together to provide you with the care you need, when you need it.  We recommend signing up for the patient portal called "MyChart".  Sign up information is provided on this After Visit Summary.  MyChart is used to connect with patients for Virtual Visits (Telemedicine).  Patients are able to view lab/test results, encounter notes, upcoming appointments, etc.  Non-urgent messages can be sent to your provider as well.   To learn more about what you can do with MyChart, go to [email or web address].    Your next appointment:   1 week(s)  The format for your next appointment:   In Person  Provider:   You may see Dr. Rockey Situ or one of the following Advanced Practice Providers on your designated Care Team:    Murray Hodgkins, NP  Christell Faith, PA-C  Marrianne Mood, PA-C  Cadence Kathlen Mody, [place]  Laurann Montana, NP    Other Instructions   Blood pressure Check your device's accuracy against an office model once a year if possible. You can do so by bringing your cuff into the office and notifying the person that rooms you that you would like to check your cuff against our office readings. We recommend upper arm BP cuffs over that of the wrist. Measure your BP maximum twice daily.  Take your BP 2 hours after your medication.  If possible, take it at the same time each day. Each time you measure, take an additional reading if abnormal to ensure accurate.  Do not measure BP right after you wake up. Avoid caffeine, tobacco, and alcohol for 30 minutes before taking a measurement. Sit quietly for five minutes in a comfortable position with your legs and ankles uncrossed and back supported. Have your arm supported and at the level of your heart. Always use the same arm when taking  your blood pressure. Place the cuff over bare skin rather than clothing. If needed, take a repeat BP reading by waiting 1-3 minutes after the first reading.  Blood pressure varies often throughout the day with higher readings in the morning. BP may also be lower at home than in the office.   It is helpful to document the time of each BP reading, as well as any activity or medications taken around the reading. In addition, it is helpful to include HR and daily morning weights (taken at the same time each day). Please bring your BP log into the office.   Goal BP is 130/80 or lower.  If your blood pressure is consistently elevated with top number above 180 and bottom above 120, this can damage the body. If you have severe increase  in your blood pressure or concerning symptoms of severe chest pain, headache with confusion and blurred vision, severe abdominal, or back pain, go to the emergency department.  Fluids and Salt --Please limit total daily fluids to under 2 L/day.  This includes all fluids such as coffee, tea, water, and juice.  Please limit total daily salt to under 2 g/day. Please weigh yourself daily.  DASH Eating Plan DASH stands for Dietary Approaches to Stop Hypertension. The DASH eating plan is a healthy eating plan that has been shown to:  Reduce high blood pressure (hypertension).  Reduce your risk for type 2 diabetes, heart disease, and stroke.  Help with weight loss. What are tips for following this plan? Reading food labels  Check food labels for the amount of salt (sodium) per serving. Choose foods with less than 5 percent of the Daily Value of sodium. Generally, foods with less than 300 milligrams (mg) of sodium per serving fit into this eating plan.  To find whole grains, look for the word "whole" as the first word in the ingredient list. Shopping  Buy products labeled as "low-sodium" or "no salt added."  Buy fresh foods. Avoid canned foods and pre-made or frozen  meals. Cooking  Avoid adding salt when cooking. Use salt-free seasonings or herbs instead of table salt or sea salt. Check with your health care provider or pharmacist before using salt substitutes.  Do not fry foods. Cook foods using healthy methods such as baking, boiling, grilling, roasting, and broiling instead.  Cook with heart-healthy oils, such as olive, canola, avocado, soybean, or sunflower oil. Meal planning  Eat a balanced diet that includes: ? 4 or more servings of fruits and 4 or more servings of vegetables each day. Try to fill one-half of your plate with fruits and vegetables. ? 6-8 servings of whole grains each day. ? Less than 6 oz (170 g) of lean meat, poultry, or fish each day. A 3-oz (85-g) serving of meat is about the same size as a deck of cards. One egg equals 1 oz (28 g). ? 2-3 servings of low-fat dairy each day. One serving is 1 cup (237 mL). ? 1 serving of nuts, seeds, or beans 5 times each week. ? 2-3 servings of heart-healthy fats. Healthy fats called omega-3 fatty acids are found in foods such as walnuts, flaxseeds, fortified milks, and eggs. These fats are also found in cold-water fish, such as sardines, salmon, and mackerel.  Limit how much you eat of: ? Canned or prepackaged foods. ? Food that is high in trans fat, such as some fried foods. ? Food that is high in saturated fat, such as fatty meat. ? Desserts and other sweets, sugary drinks, and other foods with added sugar. ? Full-fat dairy products.  Do not salt foods before eating.  Do not eat more than 4 egg yolks a week.  Try to eat at least 2 vegetarian meals a week.  Eat more home-cooked food and less restaurant, buffet, and fast food.   Lifestyle  When eating at a restaurant, ask that your food be prepared with less salt or no salt, if possible.  If you drink alcohol: ? Limit how much you use to:  0-1 drink a day for women who are not pregnant.  0-2 drinks a day for men. ? Be aware of  how much alcohol is in your drink. In the [place] one drink equals one 12 oz bottle of beer (355 mL), one 5 oz glass of wine (  148 mL), or one 1 oz glass of hard liquor (44 mL). General information  Avoid eating more than 2,300 mg of salt a day. If you have hypertension, you may need to reduce your sodium intake to 1,500 mg a day.  Work with your health care provider to maintain a healthy body weight or to lose weight. Ask what an ideal weight is for you.  Get at least 30 minutes of exercise that causes your heart to beat faster (aerobic exercise) most days of the week. Activities may include walking, swimming, or biking.  Work with your health care provider or dietitian to adjust your eating plan to your individual calorie needs. What foods should I eat? Fruits All fresh, dried, or frozen fruit. Canned fruit in natural juice (without added sugar). Vegetables Fresh or frozen vegetables (raw, steamed, roasted, or grilled). Low-sodium or reduced-sodium tomato and vegetable juice. Low-sodium or reduced-sodium tomato sauce and tomato paste. Low-sodium or reduced-sodium canned vegetables. Grains Whole-grain or whole-wheat bread. Whole-grain or whole-wheat pasta. Brown rice. [place]. Bulgur. Whole-grain and low-sodium cereals. Pita bread. Low-fat, low-sodium crackers. Whole-wheat flour tortillas. Meats and other proteins Skinless chicken or [place]. Ground chicken or [place]. Pork with fat trimmed off. Fish and seafood. Egg whites. Dried beans, peas, or lentils. Unsalted nuts, nut butters, and seeds. Unsalted canned beans. Lean cuts of beef with fat trimmed off. Low-sodium, lean precooked or cured meat, such as sausages or meat loaves. Dairy Low-fat (1%) or fat-free (skim) milk. Reduced-fat, low-fat, or fat-free cheeses. Nonfat, low-sodium ricotta or cottage cheese. Low-fat or nonfat yogurt. Low-fat, low-sodium cheese. Fats and oils Soft margarine without trans fats. Vegetable oil. Reduced-fat,  low-fat, or light mayonnaise and salad dressings (reduced-sodium). Canola, safflower, olive, avocado, soybean, and sunflower oils. Avocado. Seasonings and condiments Herbs. Spices. Seasoning mixes without salt. Other foods Unsalted popcorn and pretzels. Fat-free sweets. The items listed above may not be a complete list of foods and beverages you can eat. Contact a dietitian for more information. What foods should I avoid? Fruits Canned fruit in a light or heavy syrup. Fried fruit. Fruit in cream or butter sauce. Vegetables Creamed or fried vegetables. Vegetables in a cheese sauce. Regular canned vegetables (not low-sodium or reduced-sodium). Regular canned tomato sauce and paste (not low-sodium or reduced-sodium). Regular tomato and vegetable juice (not low-sodium or reduced-sodium). Angie Fava. Olives. Grains Baked goods made with fat, such as croissants, muffins, or some breads. Dry pasta or rice meal packs. Meats and other proteins Fatty cuts of meat. Ribs. Fried meat. Berniece Salines. Bologna, salami, and other precooked or cured meats, such as sausages or meat loaves. Fat from the back of a pig (fatback). Bratwurst. Salted nuts and seeds. Canned beans with added salt. Canned or smoked fish. Whole eggs or egg yolks. Chicken or [place] with skin. Dairy Whole or 2% milk, cream, and half-and-half. Whole or full-fat cream cheese. Whole-fat or sweetened yogurt. Full-fat cheese. Nondairy creamers. Whipped toppings. Processed cheese and cheese spreads. Fats and oils Butter. Stick margarine. Lard. Shortening. Ghee. Bacon fat. Tropical oils, such as coconut, palm kernel, or palm oil. Seasonings and condiments Onion salt, garlic salt, seasoned salt, table salt, and sea salt. Worcestershire sauce. Tartar sauce. Barbecue sauce. Teriyaki sauce. Soy sauce, including reduced-sodium. Steak sauce. Canned and packaged gravies. Fish sauce. Oyster sauce. Cocktail sauce. Store-bought horseradish. Ketchup. Mustard. Meat  flavorings and tenderizers. Bouillon cubes. Hot sauces. Pre-made or packaged marinades. Pre-made or packaged taco seasonings. Relishes. Regular salad dressings. Other foods Salted popcorn and pretzels. The items listed  above may not be a complete list of foods and beverages you should avoid. Contact a dietitian for more information. Where to find more information  National Heart, Lung, and Blood Institute: https://wilson-eaton.com/  American Heart Association: www.heart.org  Academy of Nutrition and Dietetics: www.eatright.[place]: www.kidney.org Summary  The DASH eating plan is a healthy eating plan that has been shown to reduce high blood pressure (hypertension). It may also reduce your risk for type 2 diabetes, heart disease, and stroke.  When on the DASH eating plan, aim to eat more fresh fruits and vegetables, whole grains, lean proteins, low-fat dairy, and heart-healthy fats.  With the DASH eating plan, you should limit salt (sodium) intake to 2,300 mg a day. If you have hypertension, you may need to reduce your sodium intake to 1,500 mg a day.  Work with your health care provider or dietitian to adjust your eating plan to your individual calorie needs. This information is not intended to replace advice given to you by your health care provider. Make sure you discuss any questions you have with your health care provider. Document Revised: 02/13/2019 Document Reviewed: 02/13/2019 Elsevier Patient Education  2021 [employer].

## 2020-04-12 NOTE — Progress Notes (Unsigned)
Virtual Visit via Video Note   This visit type was conducted due to national recommendations for restrictions regarding the COVID-19 Pandemic (e.g. social distancing) in an effort to limit this patient's exposure and mitigate transmission in our community.  Due to her co-morbid illnesses, this patient is at least at moderate risk for complications without adequate follow up.  This format is felt to be most appropriate for this patient at this time.  All issues noted in this document were discussed and addressed.  A limited physical exam was performed with this format.  Please refer to the patient's chart for her consent to telehealth for [facility].  Video Connection Lost Video connection was lost at > 50% of the duration of this visit, at which time the remainder of the visit was completed via audio only.     Date:  04/12/2020   ID:  Kathryn [name], DOB 01-Sep-1933, MRN [id number]  Patient Location: Home Provider Location: Home Office  PCP:  Jerrol Banana., MD  Cardiologist: Dr. Rockey Situ Electrophysiologist:  None   Evaluation Performed:  Follow-Up Visit  Chief Complaint:  87 y.o. female with history of HFrEF (EF 40 to 45%, 03/2020), hypertension, DM2,  severe COPD, prior smoking history x50 years, documented history of medication noncompliance, and seen today after 03/2020 admission with recently diagnosed with heart failure with reduced ejection fraction and possible PAF seen on telemetry.  Chief Complaint  Patient presents with  . Hospitalization Follow-up    Hospital follow up. Per patient's son she has been short of breath and bilateral ankle swelling. Medications verbally reviewed with patient and son.      History of Present Illness:    Kathryn [name] is a 87 y.o. female with PMH as above.  She underwent MPI 06/2012 that was ruled low risk.  She has history of 2014 carotid study without evidence of hemodynamically significant carotid stenosis.  She has a history of  recently diagnosed HFrEF, hypertension, COPD, prior smoking history, medication noncompliance.  Also noted is recent atrial fibrillation seen on telemetry during [facility] admission with current cardiac monitoring.  She notes a family history of CAD, including that of her son.  She was seen in the hospital 03/27/2020 after her son brought her to the emergency room for leg edema and breathing difficulty.  In the emergency room, she was placed on BiPAP and IV Lasix started, as well as DuoNeb, steroids, and Nitropaste.  She was found to have acute systolic heart failure with mildly reduced LVSF (EF 40 to 45%), as well as underlying left bundle branch block of unknown duration.  HS Tn 45 and down-trending, less  suggestive of cardiac ischemia.  Short runs of atrial fibrillation were noted on telemetry with overall burden low.  Cardiac monitoring was recommended with start of [place] deferred pending those results. She was successfully IV diuresed and switched to furosemide 20 mg daily at discharge, as well as started on ARB and BB. Recommendation was for further ischemic workup as an OP for reduced EF. She was discharged with cardiac monitor and recommendation for both cardiologic and heart failure clinic follow-up.  She was seen in the heart failure clinic 04/04/2020.  At that time, vitals significant for BP 132/75.  HR 85 bpm.  100% on room air.  Weight 172 pounds.  She reported moderate shortness of breath with exertion.  Per EMR, she stated this has been present for several months with varying levels of severity.  For example, before her  visit at the heart failure clinic, she had an episode during which time she felt quite short of breath and so pushed the button on her Zio monitor.  Shortness of breath improved with sitting down.  Also noted was associated fatigue, palpitations, pain, anxiety, and difficulty sleeping.  She was sleeping on 1-2 pillows.  No dizziness, abdominal distention, edema, chest pain, cough, or weight  gain reported.  It was noted she was still wearing her Zio monitor.  She saw her PCP 04/05/2020.  She reported improvement in her breathing per documentation.  Today, 04/12/2020, she is evaluated by telemedicine for hospital follow-up.  She is joined today by her son, who notes concern that his mother was unable to sleep over the weekend / overnight. Ms. Bares reports that she is feeling poorly and that symptoms similar to that before her admission started on Sunday night 1/16 and have progressed in a manner concerning to her and her son, as she does not want a repeat hospitalization.    She initially noted racing heart rate on Sunday night, 04/10/20.  She notes the racing heart rate started at night but did not wake her from sleep. She had racing HR that did not last for very long; however, this then resulted in chest tightness, rattling in her chest, and LEE. She states that these sx are exactly the same as those felt before her most recent admission for volume overload. Given she is aware short runs of atrial fibrillation were seen on telemetry during her admission, she pressed her pt trigger on her cardiac monitor during her initial episode of racing HR to help mark the spot. She notes that, following the racing HR, she had chest tightness along the bottom of her left breast that was intermittent and without clear exacerbating or alleviating factors, as well as without clear duration. She again reported, however, that it was very similar to before her above admission. A rattling in her chest started and has persisted, also very similar to before her admission. She has been unable to sleep since that episode of racing HR 2/2 orthopnea, preventing her from sleep in her bed as she cannot lay flat.  She instead has been sitting up straight on the couch, though still struggling with breathing all night long. She also notes swollen ankles.  Video was not available to see the patient during today's call, but per  pt's son's report, the edema was not pitting (after providing instruction on how to look for this finding on the pt).  He noted the patient's left ankle was more swollen than that of the right but only slightly so and without erythema, palpable cord, or warmth.  In an attempt to further assess patient volume status, daily weights were requested.  She has not been monitoring her weight at home with sons reports that he will start to monitor both weight and blood pressure moving forward and log these for upcoming visits.  Heart failure education was provided as outlined below with both fluid and salt restrictions.  The patient reports that she is not cooking with salt and using Mrs. Dash.  She is compliant with Lasix and reports adequate urine output.  She does not have a regular exercise routine and reports she is relatively unsteady when getting around the house.  As a result, she is relatively sedentary.  The patient does not have symptoms concerning for COVID-19 infection (fever, chills, cough, or new shortness of breath).  Shortness of breath attributed to volume overload.  Past Medical History:  Diagnosis Date  . Actinic keratosis   . Arthritis    spinal stenosis  . CHF (congestive heart failure) ([place])   . COPD (chronic obstructive pulmonary disease) ([place])   . Diabetes mellitus without complication ([place])   . Gastric ulcer 4/12   treated with Prolisec- states no problems now  . Hyperlipidemia   . Hypertension    PCP Dr Miguel Aschoff   [place]  . Hypothyroidism   . Left bundle branch block (LBBB)    atrial fibrillation  . Neuromuscular disorder ([place])    slight numbness right toes- comes and goes  . Peripheral vascular disease ([facility])    varicose veins left leg  . Pneumonia   . Seasonal allergies   . Shortness of breath   . Skin cancer    multiple from face  . Squamous cell carcinoma of skin 03/23/2013   L dorsal hand  . Squamous cell carcinoma of skin 02/04/2014   R forearm/in  situ, L pretibial  . Squamous cell carcinoma of skin 09/24/2018   R thumb   Past Surgical History:  Procedure Laterality Date  . BACK SURGERY     4/12  Dr Shellia Carwin- for lumbar stenosis  . BREAST CYST ASPIRATION Left 1965   negative  . BREAST SURGERY  1962   lumpectomy with biopsy   left  . CARPAL TUNNEL RELEASE     right  . COLONOSCOPY    . COLONOSCOPY WITH PROPOFOL N/A 06/30/2015   Procedure: COLONOSCOPY WITH PROPOFOL;  Surgeon: Hulen Luster, MD;  Location: [facility] ENDOSCOPY;  Service: Gastroenterology;  Laterality: N/A;  . COLONOSCOPY WITH PROPOFOL N/A 01/22/2017   Procedure: COLONOSCOPY WITH PROPOFOL;  Surgeon: Lollie Sails, MD;  Location[facility] ENDOSCOPY;  Service: Endoscopy;  Laterality: N/A;  . COLONOSCOPY WITH PROPOFOL N/A 01/28/2019   Procedure: COLONOSCOPY WITH PROPOFOL;  Surgeon: Toledo, Benay Pike, MD;  Location: [facility] ENDOSCOPY;  Service: Gastroenterology;  Laterality: N/A;  . ESOPHAGOGASTRODUODENOSCOPY    . ESOPHAGOGASTRODUODENOSCOPY (EGD) WITH PROPOFOL N/A 01/28/2019   Procedure: ESOPHAGOGASTRODUODENOSCOPY (EGD) WITH PROPOFOL;  Surgeon: Toledo, Benay Pike, MD;  Location: [facility] ENDOSCOPY;  Service: Gastroenterology;  Laterality: N/A;  . EYE SURGERY     cataract extraction with IOL bilaterally  . JOINT REPLACEMENT    . LUMBAR LAMINECTOMY/DECOMPRESSION MICRODISCECTOMY  05/24/2011   Procedure: LUMBAR LAMINECTOMY/DECOMPRESSION MICRODISCECTOMY 2 LEVELS;  Surgeon: Magnus Sinning, MD;  Location: [facility] ORS;  Service: Orthopedics;  Laterality: N/A;  L2-L3, L3-L4 (x-ray)  . RIGHT OOPHORECTOMY     due to STAPH INFECTION     Current Meds  Medication Sig  . acetaminophen (TYLENOL) 500 MG tablet Take 1,000 mg by mouth every 6 (six) hours as needed. Pain   . albuterol (PROVENTIL) (2.5 MG/3ML) 0.083% nebulizer solution Take 3 mLs (2.5 mg total) by nebulization every 6 (six) hours as needed for wheezing or shortness of breath.  . carvedilol (COREG) 3.125 MG tablet Take 1 tablet (3.125 mg  total) by mouth 2 (two) times daily with a meal.  . furosemide (LASIX) 20 MG tablet Take 1 tablet (20 mg total) by mouth daily.  [place] Kitchen glucose blood (CONTOUR NEXT TEST) test strip Check sugar once daily  DX E11.9  . hydrOXYzine (ATARAX/VISTARIL) 10 MG tablet TAKE 1 TO 2 TABLETS(10 TO 20 MG) BY MOUTH EVERY 6 HOURS AS NEEDED  . levothyroxine (SYNTHROID) 100 MCG tablet TAKE 1 TABLET(100 MCG) BY MOUTH DAILY BEFORE BREAKFAST  . losartan (COZAAR) 25 MG tablet Take 1 tablet (25 mg  total) by mouth every evening.  . metFORMIN (GLUCOPHAGE) 1000 MG tablet TAKE 1 TABLET(1000 MG) BY MOUTH DAILY WITH SUPPER  . omeprazole (PRILOSEC) 20 MG capsule TAKE 1 CAPSULE(20 MG) BY MOUTH DAILY  . tiotropium (SPIRIVA) 18 MCG inhalation capsule Place 1 capsule (18 mcg total) into inhaler and inhale daily as needed.     Allergies:   Oxycodone and Prednisone   Social History   Tobacco Use  . Smoking status: Former Smoker    Packs/day: 0.50    Years: 50.00    Pack years: 25.00    Types: Cigarettes    Quit date: 05/16/2004    Years since quitting: 15.9  . Smokeless tobacco: Never Used  Substance Use Topics  . Alcohol use: Yes    Comment: socially  . Drug use: No     Family Hx: The patient's family history includes Arthritis in her son; Breast cancer in her maternal aunt; Dementia in her mother; Heart disease in her father; Lung cancer in her father.  ROS:   Please see the history of present illness.    She denies pnd, n, v, dizziness, syncope, weight gain, or early satiety.  She reports chest pain, palpitations, dyspnea, shortness of breath, orthopnea, a rattling in her chest, and bilateral ankle edema.  She reports episodes of racing heart rate.  Chest pain is described as located along the lower left chest area.  It is further described as a tightness and occurs with racing heart rate and shortness of breath.  She reports fatigue due to inability to sleep, as she has been having to sit on the couch overnight due to  her orthopnea. All other systems reviewed and are negative.  Objective:    Vital Signs:  Ht 5\' 4"  (1.626 m)   Wt 172 lb (78 kg)   BMI 29.52 kg/m    VITAL SIGNS:  reviewed  Accessory clinical findings reviewed:    EKG:  No ECG reviewed.  Previous vitals reviewed today:    Temp Readings from Last 3 Encounters:  03/29/20 98 F (36.7 C) (Oral)  03/24/20 98.5 F (36.9 C) (Oral)  01/19/20 98.4 F (36.9 C) (Oral)   BP Readings from Last 3 Encounters:  04/04/20 132/75  03/29/20 130/72  03/24/20 (!) 155/78   Pulse Readings from Last 3 Encounters:  04/04/20 85  03/29/20 86  03/24/20 85    Wt Readings from Last 3 Encounters:  04/12/20 172 lb (78 kg)  04/04/20 172 lb (78 kg)  03/29/20 173 lb (78.5 kg)     Labs reviewed today:     Lab Results  Component Value Date   WBC 9.2 03/28/2020   HGB 11.1 (L) 03/28/2020   HCT 37.7 03/28/2020   MCV 85.1 03/28/2020   PLT 270 03/28/2020   Lab Results  Component Value Date   CREATININE 0.85 03/28/2020   BUN 25 (H) 03/28/2020   NA 138 03/28/2020   K 3.9 03/28/2020   CL 98 03/28/2020   CO2 30 03/28/2020   Lab Results  Component Value Date   ALT 15 03/26/2020   AST 21 03/26/2020   ALKPHOS 70 03/26/2020   BILITOT 0.6 03/26/2020   Lab Results  Component Value Date   CHOL 167 07/29/2019   HDL 59 07/29/2019   LDLCALC 81 07/29/2019   TRIG 160 (H) 07/29/2019   CHOLHDL 2.8 07/29/2019    Lab Results  Component Value Date   HGBA1C 6.9 (H) 03/27/2020   Lab Results  Component Value Date  TSH 2.340 07/29/2019     Prior CV Studies reviewed today:    The following studies were reviewed today:  Pending monitoring results  Echo 03/28/20 1. Left ventricular ejection fraction, by estimation, is 40 to 45%. The  left ventricle has mildly decreased function. Left ventricular endocardial  border not optimally defined to evaluate regional wall motion. Left  ventricular diastolic parameters are  consistent with Grade II  diastolic dysfunction (pseudonormalization). The  global longitudinal strain is abnormal.  2. Right ventricular systolic function is normal. The right ventricular  size is normal. Tricuspid regurgitation signal is inadequate for assessing  PA pressure.  3. Left atrial size was mildly dilated.  4. The mitral valve is abnormal. Mild mitral valve regurgitation. No  evidence of mitral stenosis.  5. The aortic valve is normal in structure. Aortic valve regurgitation is  mild. Mild aortic valve sclerosis is present, with no evidence of aortic  valve stenosis.   MPI 2014 Documented as low risk.  05/2010 Carotids IMPRESSION: no evidence of hemodynamically significant carotid  stenosis.    ASSESSMENT & PLAN:    Acute on chronic heart failure with reduced ejection fraction --NYHA class III.  Exam unable to be performed via telemedicine; however, her symptoms are consistent with that of volume overload.  Recent admission as above with volume overload and need for IV diuresis.  Most recent echo as above with newly reduced EF 40 to [id number], grade 2 diastolic dysfunction, GLS abnormal, mild LAE. Medications:   Lasix increased to Lasix to 40 mg daily for 3 days, then her usual Lasix 20 mg daily thereafter.  Encouraged both patient and son to call the office and update [place] regarding her volume status after 3 days, as given her symptoms, she may need a slightly longer period of increased diuresis in order to return to baseline volume status.  Will obtain BMET with further instructions pending labs if indicated.  Continue losartan 25 mg and carvedilol 3.125 mg twice daily.  Escalate GDMT as tolerated.  Pending labs, recommend transition from ARB to [facility]. Also recommend future addition of spironolactone and [place].  Work-up:  Further ischemic workup discussed with decision for [facility] for further ischemic work-up of CP and reduced EF. If MPI abnormal, recommend  [facility] for further  evaluation.   The risks [chest pain, shortness of breath, cardiac arrhythmias, dizziness, blood pressure fluctuations, myocardial infarction, stroke/transient ischemic attack, nausea, vomiting, allergic reaction, radiation exposure, metallic taste sensation and life-threatening complications (estimated to be 1 in 10,000)], benefits (risk stratification, diagnosing coronary artery disease, treatment guidance) and alternatives of a nuclear stress test were discussed in detail with Ms. Sweda and she agrees to proceed. Lifestyle   Discussed limiting fluids under 2L daily and salt under 2g daily. Activity encouraged as mobility allows.   Chest pain / tightness Left Bundle Branch Block, known --CP reported as above. LBBB noted during most recent admission.   Previous 2014 MPI ruled low risk.  CP described as a tightness that occurs in the setting of volume overload; however, given her most recent echo with reduced EF, further ischemic workup recommended as previously noted during her admission. Risk factors for cardiac etiology of CP include age, history of smoking, HTN, HLD, elevated Tg, DM2, and family history of CAD. Discussed recommended ischemic workup today. Reviewed all options with plan for MPI. If stress test abnormal,  recommend R/LHC at that time. Continue current medications, including Coreg and recently added statin as below.  Will defer addition of  ASA until monitor resulted, as this ASA will need to be discontinued if Eliquis initiated.  Recommend aggressive risk factor modification.    Questionable PAF --Question of PAF during most recent 03/2020 admission with placement of 14 day monitor to evaluate for Afib burden.  Monitor to be mailed back today.  Reports recent episodes of tachypalpitations with an episode Sunday 04/10/2020 leading to subsequent s/sx of volume overload as outlined in HPI above. She pressed the pt trigger during her tachypalpitations. She intends to mail this monitor back  today in the mail. If Afib on monitoring, recommend initiate [place] at that time. Will obtain a CBC today in preparation for possible initation of [place]. CHA2DS2VASc score of at least 7 (CHF, HTN, agex2, DM2, vascular, female) with recommendation for anticoagulation to prevent thromboembolic event. If no OAC recommended, recommend start of ASA 81mg  daily as above. Continue current carvedilol for rate control.  No home heart rate available for review.  At RTC, recommend up titration of carvedilol if indicated based on rate.  Hypertension, goal BP less than 130/80 --No home BP readings available for review. Based on HF clinic BP as above, as well as elevated volume status, suspect that her BP is elevated. Recommended daily BP checks. Continue current Coreg and losartan 25mg  daily. Recommend lincreased lasix x3 days as outlined above then drop down to lasix 20mg  daily pending results of labs. Recommended log with daily BP, HR, and wt log. Son will make sure this is done moving forward. Salt and fluid restriction again reviewed.  At RTC, if BP still elevated above goal, recommend up titration of antihypertensives as indicated.  Also considered is transition from ARB to [facility] and addition of spironolactone as above.  HLD, goal LDL <70 --Previous 07/2019 LDL 81 with goal LDL below 70.  We will repeat lipid and liver function today. Start Crestor 5 mg daily, given last LDL not at goal.  Pending labs, dose of statin +/-  Zetia may be recommended at that time.  Will plan for repeat lipid and liver function in 6 to 8 weeks.  Hypertriglyceridemia -- Previous triglycerides elevated 07/2019 at 160.  We will repeat lipids as above.  If triglycerides still elevated on repeat labs, consider addition of Vascepa at that time for more optimal triglyceride control.  Also recommended for dietary changes as discussed today.  Started on Crestor 5 mg daily today with adjustments pending labs if needed.  If LDL not well controlled,  and tolerating Crestor, consider increased dose +/- Zetia. Consider also Farxiga at RTC.  Mild MR, aortic sclerosis --Recent 03/2020 echo as above with mild MR, mild aortic valve sclerosis without evidence of stenosis.  Recommend periodic echo to monitor.  Hypothyroidism -- Most recent TSH 2.340.  Continue current Synthroid.  Further management per PCP.  DM2 --A1C 6.9%. Glycemic control recommended for risk factor modification. Consider Farxiga at RTC as above.  Continue current metformin.  Further management per PCP.  COPD, history of tobacco use --Continue current inhalers. Caution with albuterol, given this will likely exacerbate any elevated rates.  Caution with steroids.  Further management per PCP.  COVID-19 Education: The signs and symptoms of COVID-19 were discussed with the patient and how to seek care for testing (follow up with PCP or arrange E-visit).  The importance of social distancing was discussed today.  Time:   Today, I have spent 30 minutes with the patient with telehealth technology discussing the above problems.     Medication Adjustments/Labs and Tests Ordered:  Current medicines are reviewed at length with the patient today.  Concerns regarding medicines are outlined above.   Medication changes: Increase Lasix from Lasix 20 mg daily to Lasix 40 mg daily x3 days then drop down to previous 20 mg daily.  Based on labs, additional recommendations may be made regarding her Lasix and any needed potassium supplementation. Encouraged to call or message the office with update regarding sx later this week and with increased diuresis.  Start Crestor 5 mg daily with up titration +/- Zetia as indicated by repeat labs. Labs ordered: BMET, CBC, lipid, liver/lipid and liver and 6 weeks Studies / Imaging ordered: Lexiscan Myoview Future considerations: Further ischemic work-up based on MPI.  If MPI abnormal, proceed to [facility].  Results of cardiac monitoring pending with monitor to be  mailed today. Consider [place] based on burden of Afib by monitor.  If not OAC, recommend ASA 81mg  daily. Up titration of statin if indicated.  Transition from losartan to [facility], addition of spironolactone, addition of Vascepa, possible addition of [place]. Disposition: In Person in 1 week(s)  Signed, Arvil Chaco, PA-C  04/12/2020  [facility]

## 2020-04-12 NOTE — Telephone Encounter (Signed)
°  Patient Consent for Virtual Visit         Kathryn [name] has provided verbal consent on 04/12/2020 for a virtual visit (video or telephone).   CONSENT FOR VIRTUAL VISIT FOR:  Kathryn [name]  By participating in this virtual visit I agree to the following:  I hereby voluntarily request, consent and authorize [facility] and its employed or contracted physicians, Engineer, materials, nurse practitioners or other licensed health care professionals (the Practitioner), to provide me with telemedicine health care services (the Services") as deemed necessary by the treating Practitioner. I acknowledge and consent to receive the Services by the Practitioner via telemedicine. I understand that the telemedicine visit will involve communicating with the Practitioner through live audiovisual communication technology and the disclosure of certain medical information by electronic transmission. I acknowledge that I have been given the opportunity to request an in-person assessment or other available alternative prior to the telemedicine visit and am voluntarily participating in the telemedicine visit.  I understand that I have the right to withhold or withdraw my consent to the use of telemedicine in the course of my care at any time, without affecting my right to future care or treatment, and that the Practitioner or I may terminate the telemedicine visit at any time. I understand that I have the right to inspect all information obtained and/or recorded in the course of the telemedicine visit and may receive copies of available information for a reasonable fee.  I understand that some of the potential risks of receiving the Services via telemedicine include:   Delay or interruption in medical evaluation due to technological equipment failure or disruption;  Information transmitted may not be sufficient (e.g. poor resolution of images) to allow for appropriate medical decision making by the Practitioner;  and/or   In rare instances, security protocols could fail, causing a breach of personal health information.  Furthermore, I acknowledge that it is my responsibility to provide information about my medical history, conditions and care that is complete and accurate to the best of my ability. I acknowledge that Practitioner's advice, recommendations, and/or decision may be based on factors not within their control, such as incomplete or inaccurate data provided by me or distortions of diagnostic images or specimens that may result from electronic transmissions. I understand that the practice of medicine is not an exact science and that Practitioner makes no warranties or guarantees regarding treatment outcomes. I acknowledge that a copy of this consent can be made available to me via my patient portal ([place]), or I can request a printed copy by calling the office of [facility].    I understand that my insurance will be billed for this visit.   I have read or had this consent read to me.  I understand the contents of this consent, which adequately explains the benefits and risks of the Services being provided via telemedicine.   I have been provided ample opportunity to ask questions regarding this consent and the Services and have had my questions answered to my satisfaction.  I give my informed consent for the services to be provided through the use of telemedicine in my medical care

## 2020-04-13 ENCOUNTER — Other Ambulatory Visit
Admission: RE | Admit: 2020-04-13 | Discharge: 2020-04-13 | Disposition: A | Discharge status: Home / Self Care | Payer: Medicare Other | Attending: Physician Assistant | Admitting: Physician Assistant

## 2020-04-13 ENCOUNTER — Inpatient Hospital Stay: Payer: Medicare Other | Admitting: Family Medicine

## 2020-04-13 DIAGNOSIS — Z79899 Other long term (current) drug therapy: Secondary | ICD-10-CM | POA: Insufficient documentation

## 2020-04-13 DIAGNOSIS — E7800 Pure hypercholesterolemia, unspecified: Secondary | ICD-10-CM

## 2020-04-13 DIAGNOSIS — R0602 Shortness of breath: Secondary | ICD-10-CM | POA: Diagnosis not present

## 2020-04-13 DIAGNOSIS — I5023 Acute on chronic systolic (congestive) heart failure: Secondary | ICD-10-CM

## 2020-04-13 DIAGNOSIS — R079 Chest pain, unspecified: Secondary | ICD-10-CM

## 2020-04-13 DIAGNOSIS — I10 Essential (primary) hypertension: Secondary | ICD-10-CM | POA: Insufficient documentation

## 2020-04-13 LAB — CBC
HCT: 38.6 % (ref 36.0–46.0)
Hemoglobin: 11.6 g/dL — ABNORMAL LOW (ref 12.0–15.0)
MCH: 25.5 pg — ABNORMAL LOW (ref 26.0–34.0)
MCHC: 30.1 g/dL (ref 30.0–36.0)
MCV: 84.8 fL (ref 80.0–100.0)
Platelets: 369 10*3/uL (ref 150–400)
RBC: 4.55 MIL/uL (ref 3.87–5.11)
RDW: 15.6 % — ABNORMAL HIGH (ref 11.5–15.5)
WBC: 6.3 10*3/uL (ref 4.0–10.5)
nRBC: 0 % (ref 0.0–0.2)

## 2020-04-13 LAB — BASIC METABOLIC PANEL
Anion gap: 11 (ref 5–15)
BUN: 21 mg/dL (ref 8–23)
CO2: 25 mmol/L (ref 22–32)
Calcium: 10 mg/dL (ref 8.9–10.3)
Chloride: 104 mmol/L (ref 98–111)
Creatinine, Ser: 0.76 mg/dL (ref 0.44–1.00)
GFR, Estimated: >60 mL/min (ref >60)
Glucose, Bld: 179 mg/dL — ABNORMAL HIGH (ref 70–99)
Potassium: 4.6 mmol/L (ref 3.5–5.1)
Sodium: 140 mmol/L (ref 135–145)

## 2020-04-13 LAB — LIPID PANEL
Cholesterol: 294 mg/dL — ABNORMAL HIGH (ref 0–200)
HDL: 50 mg/dL (ref >40)
LDL Cholesterol: 196 mg/dL — ABNORMAL HIGH (ref 0–99)
Total CHOL/HDL Ratio: 5.9 RATIO
Triglycerides: 242 mg/dL — ABNORMAL HIGH (ref <150)
VLDL: 48 mg/dL — ABNORMAL HIGH (ref 0–40)

## 2020-04-13 LAB — HEPATIC FUNCTION PANEL
ALT: 12 U/L (ref 0–44)
AST: 14 U/L — ABNORMAL LOW (ref 15–41)
Albumin: 4.1 g/dL (ref 3.5–5.0)
Alkaline Phosphatase: 68 U/L (ref 38–126)
Bilirubin, Direct: <0.1 mg/dL (ref 0.0–0.2)
Total Bilirubin: 0.8 mg/dL (ref 0.3–1.2)
Total Protein: 7.7 g/dL (ref 6.5–8.1)

## 2020-04-13 NOTE — Telephone Encounter (Signed)
Tiffany advised as below. 

## 2020-04-15 ENCOUNTER — Encounter
Admission: RE | Admit: 2020-04-15 | Discharge: 2020-04-15 | Disposition: A | Discharge status: Home / Self Care | Payer: Medicare Other | Source: Ambulatory Visit | Attending: Physician Assistant | Admitting: Physician Assistant

## 2020-04-15 ENCOUNTER — Telehealth: Payer: Self-pay | Admitting: *Deleted

## 2020-04-15 ENCOUNTER — Other Ambulatory Visit: Payer: Self-pay

## 2020-04-15 DIAGNOSIS — R079 Chest pain, unspecified: Secondary | ICD-10-CM | POA: Diagnosis not present

## 2020-04-15 LAB — NM MYOCAR MULTI W/SPECT W/WALL MOTION / EF
LV dias vol: 131 mL (ref 46–106)
LV sys vol: 86 mL
MPHR: 136 {beats}/min
Peak HR: 103 {beats}/min
Percent HR: 75 %
Rest HR: 82 {beats}/min
SDS: 3
SRS: 2
SSS: 4
TID: 1.02

## 2020-04-15 MED ORDER — TECHNETIUM TC 99M TETROFOSMIN IV KIT
29.7280 | PACK | Freq: Once | INTRAVENOUS | Status: AC | PRN
  Administered 2020-04-15: 29.728 via INTRAVENOUS

## 2020-04-15 MED ORDER — TECHNETIUM TC 99M TETROFOSMIN IV KIT
10.0000 | PACK | Freq: Once | INTRAVENOUS | Status: AC | PRN
  Administered 2020-04-15: 10.283 via INTRAVENOUS

## 2020-04-15 MED ORDER — REGADENOSON 0.4 MG/5ML IV SOLN
0.4000 mg | Freq: Once | INTRAVENOUS | Status: AC
  Administered 2020-04-15: 0.4 mg via INTRAVENOUS

## 2020-04-15 NOTE — Telephone Encounter (Signed)
Spoke with patient and she states she is getting ready to leave house to have stress test done. Inquired if she saw the results on her My Chart and she said yes but they were confusing to her. Advised that I would call her back later today to review this information in more detail to help her understand reasons and instructions. She was appreciative for the call with no further questions at this time.

## 2020-04-15 NOTE — Telephone Encounter (Signed)
Spoke with patient and attempted to review results. She states that she has appointment on Monday and would prefer to wait until then so her family can be with her to also listen on these instructions. Advised that I would send to Dr. Rockey Situ and his nurse so they are aware to review this as well. She was very appreciative for the call with no further questions at this time.

## 2020-04-15 NOTE — Telephone Encounter (Signed)
Patient calling  States that nurse could call back today now that she is home or it can be discussed when she is at her appointment on Monday 01/24

## 2020-04-15 NOTE — Telephone Encounter (Signed)
-----   Message from Arvil Chaco, PA-C sent at 04/14/2020  9:53 AM EST ----- Labs show  --Renal function stable with increased diuresis.    --Potassium at goal.   --Anemia stable from previous labs.   --Cholesterol / triglycerides are elevated above goal.    --Liver function with borderline low AST but otherwise WNL  Recommendations --Continue lasix 20mg  daily with recommendation for sliding scale diuresis going forward.  Take an extra tablet for wt gain 3lbs overnight or 5lbs in a week. Call the office if ongoing symptoms after her 3 days of increased diuresis.  If still symptomatic during this result note call, please let me know.  --As long as SBP consistently [id number] systolic since our visit this week, recommend stop losartan 25mg  and start Entresto 24/26mg  BID. Repeat BMET in 1 week or at upcoming visit next week.  --Given cholesterol labs, recommend increase to Crestor 40mg  daily. Repeat lipid and liver function in 6-8 weeks.

## 2020-04-17 NOTE — Progress Notes (Signed)
Cardiology Office Note  Date:  04/18/2020   ID:  Kathryn [name], DOB 1934/01/09, MRN [id number]  PCP:  Jerrol Banana., MD   Chief Complaint  Patient presents with  . Other    Follow up post Lexi - Patient c/o swelling in ankles and SOB with exertion. Meds reviewed verbally with patient.     HPI:  [name] is a 24 short woman with  long smoking history for 50 years he stopped 6-7 years ago,  COPD back surgery x2,  chronic back pain with pain radiating down her left leg, possible hip arthritis, chronic and worsening shortness of breath over the past several years, Pulmonary HTN/flash pulm HTN EF 40 to 45% January 2022  hyperlipidemia  who presents for evaluation of her shortness of breath, possible PAF, hyperlipidemia, diastolic CHF  Recently in the hospital 03/27/2020  Records reviewed in detail Presented with leg edema and breathing difficulty. placed on BiPAP and IV Lasix started,  DuoNeb, steroids, and Nitropaste.  Echocardiogram : mildly reduced LVSF (EF 40 to 45%),  left bundle branch block of unknown duration.   Some question of whether there was atrial fibrillation seen on her monitor  In follow-up, seen in the heart failure clinic 04/04/2020.   moderate shortness of breath with exertion.    Zio monitor ordered: Results are not back yet  PCP 04/05/2020.    improvement in her breathing per documentation.  04/12/2020,   unable to sleep over the weekend / overnight.   feeling poorly and that symptoms similar to that before her admission   racing heart rate on Sunday night, 04/10/20.  atrial fib  Lasix up to 40 for 3 days then back to 20 daily Started on crestor 5 daily  Stress test ordered that showed no significant ischemia Ejection fraction 30 to 44% CT attenuation correction images with aortic calcification, coronary calcification proximal LAD region (Echocardiogram March 28, 2020 estimated ejection fraction 40 to 45%)  Weight at home: 172 Was using  cane, walker now wheelchair  EKG personally reviewed by myself on todays visit Normal sinus rhythm rate 78 bpm left bundle branch block   PMH:   has a past medical history of Actinic keratosis, Arthritis, CHF (congestive heart failure) ([place]), COPD (chronic obstructive pulmonary disease) ([place]), Diabetes mellitus without complication ([place]), Gastric ulcer (4/12), Hyperlipidemia, Hypertension, Hypothyroidism, Left bundle branch block (LBBB), Neuromuscular disorder ([place]), Peripheral vascular disease ([place]), Pneumonia, Seasonal allergies, Shortness of breath, Skin cancer, Squamous cell carcinoma of skin (03/23/2013), Squamous cell carcinoma of skin (02/04/2014), and Squamous cell carcinoma of skin (09/24/2018).  PSH:    Past Surgical History:  Procedure Laterality Date  . BACK SURGERY     4/12  Dr Shellia Carwin- for lumbar stenosis  . BREAST CYST ASPIRATION Left 1965   negative  . BREAST SURGERY  1962   lumpectomy with biopsy   left  . CARPAL TUNNEL RELEASE     right  . COLONOSCOPY    . COLONOSCOPY WITH PROPOFOL N/A 06/30/2015   Procedure: COLONOSCOPY WITH PROPOFOL;  Surgeon: Hulen Luster, MD;  Location: [facility] ENDOSCOPY;  Service: Gastroenterology;  Laterality: N/A;  . COLONOSCOPY WITH PROPOFOL N/A 01/22/2017   Procedure: COLONOSCOPY WITH PROPOFOL;  Surgeon: Lollie Sails, MD;  Location: [facility] ENDOSCOPY;  Service: Endoscopy;  Laterality: N/A;  . COLONOSCOPY WITH PROPOFOL N/A 01/28/2019   Procedure: COLONOSCOPY WITH PROPOFOL;  Surgeon: Toledo, Benay Pike, MD;  Location: [facility] ENDOSCOPY;  Service: Gastroenterology;  Laterality: N/A;  . ESOPHAGOGASTRODUODENOSCOPY    .  ESOPHAGOGASTRODUODENOSCOPY (EGD) WITH PROPOFOL N/A 01/28/2019   Procedure: ESOPHAGOGASTRODUODENOSCOPY (EGD) WITH PROPOFOL;  Surgeon: Toledo, Benay Pike, MD;  Location: [facility] ENDOSCOPY;  Service: Gastroenterology;  Laterality: N/A;  . EYE SURGERY     cataract extraction with IOL bilaterally  . JOINT REPLACEMENT    . LUMBAR  LAMINECTOMY/DECOMPRESSION MICRODISCECTOMY  05/24/2011   Procedure: LUMBAR LAMINECTOMY/DECOMPRESSION MICRODISCECTOMY 2 LEVELS;  Surgeon: Magnus Sinning, MD;  Location: [facility] ORS;  Service: Orthopedics;  Laterality: N/A;  L2-L3, L3-L4 (x-ray)  . RIGHT OOPHORECTOMY     due to STAPH INFECTION    Current Outpatient Medications  Medication Sig Dispense Refill  . acetaminophen (TYLENOL) 500 MG tablet Take 1,000 mg by mouth every 6 (six) hours as needed. Pain     . albuterol (PROVENTIL) (2.5 MG/3ML) 0.083% nebulizer solution Take 3 mLs (2.5 mg total) by nebulization every 6 (six) hours as needed for wheezing or shortness of breath. 150 mL 12  . aspirin EC 81 MG tablet Take 1 tablet (81 mg total) by mouth daily. Swallow whole. 90 tablet 3  . carvedilol (COREG) 3.125 MG tablet Take 1 tablet (3.125 mg total) by mouth 2 (two) times daily with a meal. 60 tablet 0  . furosemide (LASIX) 20 MG tablet Take 1 tablet (20 mg total) by mouth daily. 30 tablet 5  . glucose blood (CONTOUR NEXT TEST) test strip Check sugar once daily  DX E11.9 100 each 3  . hydrOXYzine (ATARAX/VISTARIL) 10 MG tablet TAKE 1 TO 2 TABLETS(10 TO 20 MG) BY MOUTH EVERY 6 HOURS AS NEEDED 100 tablet 4  . levothyroxine (SYNTHROID) 100 MCG tablet TAKE 1 TABLET(100 MCG) BY MOUTH DAILY BEFORE BREAKFAST 90 tablet 3  . losartan (COZAAR) 25 MG tablet Take 1 tablet (25 mg total) by mouth every evening. 30 tablet 2  . metFORMIN (GLUCOPHAGE) 1000 MG tablet TAKE 1 TABLET(1000 MG) BY MOUTH DAILY WITH SUPPER 30 tablet 0  . omeprazole (PRILOSEC) 20 MG capsule TAKE 1 CAPSULE(20 MG) BY MOUTH DAILY 30 capsule 0  . rosuvastatin (CRESTOR) 40 MG tablet Take 1 tablet (40 mg total) by mouth daily. 90 tablet 2  . tiotropium (SPIRIVA) 18 MCG inhalation capsule Place 1 capsule (18 mcg total) into inhaler and inhale daily as needed. 30 capsule 12   No current facility-administered medications for this visit.     Allergies:   Oxycodone and Prednisone   Social  History:  The patient  reports that she quit smoking about 15 years ago. Her smoking use included cigarettes. She has a 25.00 pack-year smoking history. She has never used smokeless tobacco. She reports current alcohol use. She reports that she does not use drugs.   Family History:   family history includes Arthritis in her son; Breast cancer in her maternal aunt; Dementia in her mother; Heart disease in her father; Lung cancer in her father.    Review of Systems: Review of Systems  Constitutional: Negative.   HENT: Negative.   Respiratory: Negative.   Cardiovascular: Negative.   Gastrointestinal: Negative.   Musculoskeletal: Negative.   Neurological: Negative.   Psychiatric/Behavioral: Negative.   All other systems reviewed and are negative.   PHYSICAL EXAM: VS:  BP 122/60 (BP Location: Right Arm, Patient Position: Sitting, Cuff Size: Normal)   Pulse 78   Ht 5\' 4"  (1.626 m)   Wt 176 lb (79.8 kg)   SpO2 96%   BMI 30.21 kg/m  , BMI Body mass index is 30.21 kg/m. GEN: Well nourished, well developed, in no acute  distress HEENT: normal Neck: no JVD, carotid bruits, or masses Cardiac: RRR; no murmurs, rubs, or gallops,no edema  Respiratory:  clear to auscultation bilaterally, normal work of breathing GI: soft, nontender, nondistended, + BS MS: no deformity or atrophy Skin: warm and dry, no rash Neuro:  Strength and sensation are intact Psych: euthymic mood, full affect  Recent Labs: 07/29/2019: TSH 2.340 03/26/2020: B Natriuretic Peptide 337.8 04/13/2020: ALT 12; BUN 21; Creatinine, Ser 0.76; Hemoglobin 11.6; Platelets 369; Potassium 4.6; Sodium 140    Lipid Panel Lab Results  Component Value Date   CHOL 294 (H) 04/13/2020   HDL 50 04/13/2020   LDLCALC 196 (H) 04/13/2020   TRIG 242 (H) 04/13/2020     Wt Readings from Last 3 Encounters:  04/18/20 176 lb (79.8 kg)  04/12/20 172 lb (78 kg)  04/04/20 172 lb (78 kg)     ASSESSMENT AND PLAN:  Problem List Items Addressed  This Visit      Cardiology Problems   Hypercholesteremia   Essential hypertension     Other   Diabetes mellitus type 2, uncomplicated ([place])   History of tobacco use    Other Visit Diagnoses    Atrial tachycardia, paroxysmal ([place])    -  Primary   Centrilobular emphysema ([place])         COPD/emphysema Reports symptoms stable, Recovered from prior hospitalization  Cardiomyopathy Stress test discussed with her, No significant ischemia, no further testing at this time She denies any anginal symptoms Continue losartan with Coreg We'll make referral to cardiac rehab  Atrial tachycardia Denies any significant arrhythmia Continue carvedilol  Hyperlipidemia She prefers to change back to Lipitor which she was on previously, we will restart Lipitor 40 daily She reports having no side effects in the past, LDL was 70 on Lipitor Without statin markedly elevated numbers  Coronary disease with stable angina Denies anginal symptoms   Total encounter time more than 35 minutes  Greater than 50% was spent in counseling and coordination of care with the patient   Signed, Esmond Plants, M.D., Ph.D. [place], [place]

## 2020-04-18 ENCOUNTER — Ambulatory Visit (INDEPENDENT_AMBULATORY_CARE_PROVIDER_SITE_OTHER): Payer: Medicare Other | Admitting: Cardiovascular Disease

## 2020-04-18 ENCOUNTER — Telehealth: Payer: Self-pay | Admitting: *Deleted

## 2020-04-18 ENCOUNTER — Encounter: Payer: Self-pay | Admitting: Cardiovascular Disease

## 2020-04-18 ENCOUNTER — Other Ambulatory Visit: Payer: Self-pay

## 2020-04-18 ENCOUNTER — Other Ambulatory Visit: Payer: Self-pay | Admitting: *Deleted

## 2020-04-18 VITALS — BP 122/60 | HR 78 | Ht 64.0 in | Wt 176.0 lb

## 2020-04-18 DIAGNOSIS — I5043 Acute on chronic combined systolic (congestive) and diastolic (congestive) heart failure: Secondary | ICD-10-CM

## 2020-04-18 DIAGNOSIS — I10 Essential (primary) hypertension: Secondary | ICD-10-CM

## 2020-04-18 DIAGNOSIS — E7800 Pure hypercholesterolemia, unspecified: Secondary | ICD-10-CM

## 2020-04-18 DIAGNOSIS — I428 Other cardiomyopathies: Secondary | ICD-10-CM | POA: Diagnosis not present

## 2020-04-18 DIAGNOSIS — I471 Supraventricular tachycardia: Secondary | ICD-10-CM

## 2020-04-18 DIAGNOSIS — E119 Type 2 diabetes mellitus without complications: Secondary | ICD-10-CM

## 2020-04-18 DIAGNOSIS — Z79899 Other long term (current) drug therapy: Secondary | ICD-10-CM

## 2020-04-18 DIAGNOSIS — I5032 Chronic diastolic (congestive) heart failure: Secondary | ICD-10-CM

## 2020-04-18 DIAGNOSIS — J432 Centrilobular emphysema: Secondary | ICD-10-CM | POA: Diagnosis not present

## 2020-04-18 DIAGNOSIS — Z87891 Personal history of nicotine dependence: Secondary | ICD-10-CM

## 2020-04-18 MED ORDER — ATORVASTATIN CALCIUM 40 MG PO TABS: 40.0000 mg | ORAL_TABLET | Freq: Every day | ORAL | 3 refills | Status: DC

## 2020-04-18 MED ORDER — FUROSEMIDE 20 MG PO TABS: 20.0000 mg | ORAL_TABLET | Freq: Every day | ORAL | 3 refills | Status: DC

## 2020-04-18 MED ORDER — ASPIRIN EC 81 MG PO TBEC: 81.0000 mg | DELAYED_RELEASE_TABLET | Freq: Every day | ORAL | 3 refills | Status: DC

## 2020-04-18 MED ORDER — ROSUVASTATIN CALCIUM 40 MG PO TABS: 40.0000 mg | ORAL_TABLET | Freq: Every day | ORAL | 2 refills | Status: DC

## 2020-04-18 NOTE — Patient Instructions (Addendum)
A referral for cardiac rehab was placed for your cardiomyopathy, CHF (congestic heart failure), they should call you with a consult.    Medication Instructions:  Stay on one lasix once a day You can take an extra lasix in the morning or after lunch as needed for swelling  When you run out of the crestor, change to atorvastatin/lipitor 40 mg once a day   If you need a refill on your cardiac medications before your next appointment, please call your pharmacy.    Lab work: No new labs needed   Testing/Procedures: No new testing needed   Follow-Up: At [facility], you and your health needs are our priority.  As part of our continuing mission to provide you with exceptional heart care, we have created designated Provider Care Teams.  These Care Teams include your primary Cardiologist (physician) and Advanced Practice Providers (APPs -  Physician Assistants and Nurse Practitioners) who all work together to provide you with the care you need, when you need it.  . You will need a follow up appointment in 3 months, APP ok  . Providers on your designated Care Team:   . Murray Hodgkins, NP . Christell Faith, PA-C . Marrianne Mood, PA-C  Any Other Special Instructions Will Be Listed Below (If Applicable).  COVID-19 Vaccine Information can be found at: ShippingScam.co.uk For questions related to vaccine distribution or appointments, please email vaccine@[email or web address] .com or call [phone].

## 2020-04-18 NOTE — Telephone Encounter (Signed)
-----   Message from Arvil Chaco, PA-C sent at 04/15/2020  6:45 PM EST ----- Stress test results showed --Decreased pump function or squeeze of the heart.  --No clear evidence of blockages or interference in blood flow through the heart vessels, which is reassuring.  --CT did show aortic / coronary calcifications.   Recommend aggressive risk factor modification with medical management to prevent increased or worsening build up of plaque in the heart vessels, which could lead to a blockage or reduced blood flow to the heart. --Increase to Crestor 40mg  daily as noted per LDL lab results.  --Repeat LDL/LFTs in 6-8 weeks to reassess /  ensure optimized LDL control. --Start ASA 81 mg daily.  Still waiting on cardiac monitoring results to determine if Afib /quantify burden of any arrhythmia/ectopy. If [place] recommended for A. fib, will discontinue ASA at that time to reduce risk of bleeding.

## 2020-04-18 NOTE — Telephone Encounter (Signed)
Results called to pt. Pt verbalized understanding. She has appointment this afternoon with Dr Rockey Situ.  She is aware of the medication changes and the need for repeat lab work. Though she said she will wait to see Dr Rockey Situ for final changes.   Repeat lab orders entered. Med list updated. Routing to Lucia Bitter, RN to make sure AVS can be updated for patient today at clinic visit.

## 2020-04-20 ENCOUNTER — Other Ambulatory Visit: Payer: Self-pay | Admitting: Physician Assistant

## 2020-04-20 DIAGNOSIS — I471 Supraventricular tachycardia: Secondary | ICD-10-CM

## 2020-04-20 DIAGNOSIS — I447 Left bundle-branch block, unspecified: Secondary | ICD-10-CM

## 2020-04-20 NOTE — Addendum Note (Signed)
Encounter addended by: Jeannette How on: 04/20/2020 1:37 PM  Actions taken: Imaging Exam ended

## 2020-04-22 DIAGNOSIS — M19011 Primary osteoarthritis, right shoulder: Secondary | ICD-10-CM | POA: Diagnosis not present

## 2020-04-22 DIAGNOSIS — M19012 Primary osteoarthritis, left shoulder: Secondary | ICD-10-CM | POA: Diagnosis not present

## 2020-04-25 ENCOUNTER — Other Ambulatory Visit: Payer: Self-pay | Admitting: Family Medicine

## 2020-04-26 NOTE — Telephone Encounter (Signed)
Requested Prescriptions  Pending Prescriptions Disp Refills  . hydrOXYzine (ATARAX/VISTARIL) 10 MG tablet [Pharmacy Med Name: HYDROXYZINE HCL 10MG  TABLETS] 100 tablet 1    Sig: TAKE 1 TO 2 TABLETS(10 TO 20 MG) BY MOUTH EVERY 6 HOURS AS NEEDED     Ear, Nose, and Throat:  Antihistamines Passed - 04/25/2020  5:01 PM      Passed - Valid encounter within last 12 months    Recent Outpatient Visits          3 weeks ago Acute systolic congestive heart failure [facility])   [facility] Jerrol Banana., MD   3 months ago [place] Jerrol Banana., MD   3 months ago Essential hypertension   [facility] Jerrol Banana., MD   3 months ago [place] Jerrol Banana., MD   4 months ago Type 2 diabetes mellitus without complication, without long-term current use of insulin [facility])   [facility] Jerrol Banana., MD      Future Appointments            In 2 months Jerrol Banana., MD [facility], [place]   In 2 months Gollan, Kathlene November, MD [facility]

## 2020-04-27 ENCOUNTER — Telehealth: Payer: Self-pay | Admitting: *Deleted

## 2020-04-27 DIAGNOSIS — I471 Supraventricular tachycardia: Secondary | ICD-10-CM

## 2020-04-27 DIAGNOSIS — I472 Ventricular tachycardia, unspecified: Secondary | ICD-10-CM

## 2020-04-27 MED ORDER — CARVEDILOL 6.25 MG PO TABS: 6.2500 mg | ORAL_TABLET | Freq: Two times a day (BID) | ORAL | 3 refills | Status: DC

## 2020-04-27 NOTE — Telephone Encounter (Signed)
Spoke to pt, notified of heart monitor results and provider's recc below. Pt verbalized understanding and will incr Carvedilol to 6.25mg  BID. New Rx sent to pt's pharmacy. EP referral placed.

## 2020-04-27 NOTE — Telephone Encounter (Signed)
-----   Message from Arvil Chaco, PA-C sent at 04/26/2020  9:36 PM EST ----- Monitor showed  --NSR or a normal heart rhythm with minimum HR 66 bpm, maximum 211 bpm, and average heart rate 87 bpm. --1 run of VT, which is a faster heart rhythm that originates from the bottom chambers of the heart.  This lasted 4 beats with a maximum rate of 185 bpm. --43 runs of SVT, which is a faster rhythm that originates from the top part of the heart.  The fastest interval lasted 10 hours and 35 minutes with a maximum rate of 211 bpm.  --Idioventricular rhythm was also present. --Isolated beats from the top part of the heart were rare.  Sometimes these occurred twice or 3 times in a row. --Isolated beats from the bottom part of the heart were rare.  Sometimes these occurred twice or 3 times in a row.  These also sometimes occurred every other or every third beat. --Patient triggered events were not associated with significant arrhythmia.  Recommendations --Increase to carvedilol 6.25 mg twice daily. --EP referral given the findings above.

## 2020-04-28 ENCOUNTER — Ambulatory Visit: Payer: Medicare Other | Admitting: Family

## 2020-05-02 DIAGNOSIS — Z9181 History of falling: Secondary | ICD-10-CM | POA: Diagnosis not present

## 2020-05-02 DIAGNOSIS — Z79891 Long term (current) use of opiate analgesic: Secondary | ICD-10-CM | POA: Diagnosis not present

## 2020-05-02 DIAGNOSIS — E1151 Type 2 diabetes mellitus with diabetic peripheral angiopathy without gangrene: Secondary | ICD-10-CM | POA: Diagnosis not present

## 2020-05-02 DIAGNOSIS — K259 Gastric ulcer, unspecified as acute or chronic, without hemorrhage or perforation: Secondary | ICD-10-CM | POA: Diagnosis not present

## 2020-05-02 DIAGNOSIS — Z87891 Personal history of nicotine dependence: Secondary | ICD-10-CM | POA: Diagnosis not present

## 2020-05-02 DIAGNOSIS — J449 Chronic obstructive pulmonary disease, unspecified: Secondary | ICD-10-CM | POA: Diagnosis not present

## 2020-05-02 DIAGNOSIS — G709 Myoneural disorder, unspecified: Secondary | ICD-10-CM | POA: Diagnosis not present

## 2020-05-02 DIAGNOSIS — M4800 Spinal stenosis, site unspecified: Secondary | ICD-10-CM | POA: Diagnosis not present

## 2020-05-02 DIAGNOSIS — Z90721 Acquired absence of ovaries, unilateral: Secondary | ICD-10-CM | POA: Diagnosis not present

## 2020-05-02 DIAGNOSIS — Z8701 Personal history of pneumonia (recurrent): Secondary | ICD-10-CM | POA: Diagnosis not present

## 2020-05-02 DIAGNOSIS — I110 Hypertensive heart disease with heart failure: Secondary | ICD-10-CM | POA: Diagnosis not present

## 2020-05-02 DIAGNOSIS — K219 Gastro-esophageal reflux disease without esophagitis: Secondary | ICD-10-CM | POA: Diagnosis not present

## 2020-05-02 DIAGNOSIS — Z7984 Long term (current) use of oral hypoglycemic drugs: Secondary | ICD-10-CM | POA: Diagnosis not present

## 2020-05-02 DIAGNOSIS — L570 Actinic keratosis: Secondary | ICD-10-CM | POA: Diagnosis not present

## 2020-05-02 DIAGNOSIS — J9601 Acute respiratory failure with hypoxia: Secondary | ICD-10-CM | POA: Diagnosis not present

## 2020-05-02 DIAGNOSIS — I447 Left bundle-branch block, unspecified: Secondary | ICD-10-CM | POA: Diagnosis not present

## 2020-05-02 DIAGNOSIS — Z85828 Personal history of other malignant neoplasm of skin: Secondary | ICD-10-CM | POA: Diagnosis not present

## 2020-05-02 DIAGNOSIS — J302 Other seasonal allergic rhinitis: Secondary | ICD-10-CM | POA: Diagnosis not present

## 2020-05-02 DIAGNOSIS — E039 Hypothyroidism, unspecified: Secondary | ICD-10-CM | POA: Diagnosis not present

## 2020-05-02 DIAGNOSIS — I5021 Acute systolic (congestive) heart failure: Secondary | ICD-10-CM | POA: Diagnosis not present

## 2020-05-02 DIAGNOSIS — E785 Hyperlipidemia, unspecified: Secondary | ICD-10-CM | POA: Diagnosis not present

## 2020-05-08 ENCOUNTER — Other Ambulatory Visit: Payer: Self-pay | Admitting: Family Medicine

## 2020-05-08 DIAGNOSIS — E119 Type 2 diabetes mellitus without complications: Secondary | ICD-10-CM

## 2020-05-11 ENCOUNTER — Ambulatory Visit (INDEPENDENT_AMBULATORY_CARE_PROVIDER_SITE_OTHER): Payer: Medicare Other | Admitting: Cardiology

## 2020-05-11 ENCOUNTER — Encounter: Payer: Self-pay | Admitting: Cardiology

## 2020-05-11 ENCOUNTER — Other Ambulatory Visit: Payer: Self-pay

## 2020-05-11 VITALS — BP 116/60 | HR 87 | Ht 64.0 in | Wt 173.0 lb

## 2020-05-11 DIAGNOSIS — I447 Left bundle-branch block, unspecified: Secondary | ICD-10-CM

## 2020-05-11 DIAGNOSIS — I471 Supraventricular tachycardia: Secondary | ICD-10-CM

## 2020-05-11 LAB — BLOOD GAS, VENOUS
Acid-base deficit: 4.1 mmol/L — ABNORMAL HIGH (ref 0.0–2.0)
Bicarbonate: 23.3 mmol/L (ref 20.0–28.0)
O2 Saturation: 48.5 %
Patient temperature: 37
pCO2, Ven: 52 mmHg (ref 44.0–60.0)
pH, Ven: 7.26 (ref 7.250–7.430)

## 2020-05-11 MED ORDER — AMIODARONE HCL 200 MG PO TABS: ORAL_TABLET | ORAL | 1 refills | Status: DC

## 2020-05-11 NOTE — Progress Notes (Signed)
Electrophysiology Office Note:    Date:  05/11/2020   ID:  Kathryn [name], DOB [birth year], MRN [id number]  PCP:  Jerrol Banana., MD  [facility] Cardiologist:  Ida Rogue, MD  [facility] Electrophysiologist:  Vickie Epley, MD   Referring MD: Arvil Chaco, PA*   Chief Complaint: SVT and VT  History of Present Illness:    Kathryn [name] is a 87 y.o. female who presents for an evaluation of SVT and VT at the request of Dr. Rockey Situ and Lorenso Quarry, PA-C. Their medical history includes COPD, diabetes, hypertension, hyperlipidemia, left bundle branch block.  The patient is presenting to EP clinic to discuss abnormal Holter monitor findings.  The Holter monitor was placed after recent admission on March 26, 7320 for acute systolic heart failure.  Her ejection fraction was measured as 40%.  She was diuresed while she was inpatient and her symptoms improved.  At discharge, a ZIO monitor was placed given possible paroxysmal atrial fibrillation noted on telemetry while inpatient.  The ZIO monitor has returned with several episodes of prolonged SVT.  Episodes of SVT are regular and appear to have retrograde atrial conduction in a short RP configuration.  Past Medical History:  Diagnosis Date  . Actinic keratosis   . Arthritis    spinal stenosis  . CHF (congestive heart failure) ([place])   . COPD (chronic obstructive pulmonary disease) ([place])   . Diabetes mellitus without complication ([place])   . Gastric ulcer 4/12   treated with Prolisec- states no problems now  . Hyperlipidemia   . Hypertension    PCP Dr Miguel Aschoff   [place]  . Hypothyroidism   . Left bundle branch block (LBBB)    atrial fibrillation  . Neuromuscular disorder ([place])    slight numbness right toes- comes and goes  . Peripheral vascular disease (HCC)    varicose veins left leg  . Pneumonia   . Seasonal allergies   . Shortness of breath   . Skin cancer    multiple from face  .  Squamous cell carcinoma of skin 03/23/2013   L dorsal hand  . Squamous cell carcinoma of skin 02/04/2014   R forearm/in situ, L pretibial  . Squamous cell carcinoma of skin 09/24/2018   R thumb    Past Surgical History:  Procedure Laterality Date  . BACK SURGERY     4/12  Dr Shellia Carwin- for lumbar stenosis  . BREAST CYST ASPIRATION Left 1965   negative  . BREAST SURGERY  1962   lumpectomy with biopsy   left  . CARPAL TUNNEL RELEASE     right  . COLONOSCOPY    . COLONOSCOPY WITH PROPOFOL N/A 06/30/2015   Procedure: COLONOSCOPY WITH PROPOFOL;  Surgeon: Hulen Luster, MD;  Location: [facility] ENDOSCOPY;  Service: Gastroenterology;  Laterality: N/A;  . COLONOSCOPY WITH PROPOFOL N/A 01/22/2017   Procedure: COLONOSCOPY WITH PROPOFOL;  Surgeon: Lollie Sails, MD;  Location: [facility] ENDOSCOPY;  Service: Endoscopy;  Laterality: N/A;  . COLONOSCOPY WITH PROPOFOL N/A 01/28/2019   Procedure: COLONOSCOPY WITH PROPOFOL;  Surgeon: Toledo, Benay Pike, MD;  Location: [facility] ENDOSCOPY;  Service: Gastroenterology;  Laterality: N/A;  . ESOPHAGOGASTRODUODENOSCOPY    . ESOPHAGOGASTRODUODENOSCOPY (EGD) WITH PROPOFOL N/A 01/28/2019   Procedure: ESOPHAGOGASTRODUODENOSCOPY (EGD) WITH PROPOFOL;  Surgeon: Toledo, Benay Pike, MD;  Location: [facility] ENDOSCOPY;  Service: Gastroenterology;  Laterality: N/A;  . EYE SURGERY     cataract extraction with IOL bilaterally  . JOINT REPLACEMENT    .  LUMBAR LAMINECTOMY/DECOMPRESSION MICRODISCECTOMY  05/24/2011   Procedure: LUMBAR LAMINECTOMY/DECOMPRESSION MICRODISCECTOMY 2 LEVELS;  Surgeon: Magnus Sinning, MD;  Location: [facility] ORS;  Service: Orthopedics;  Laterality: N/A;  L2-L3, L3-L4 (x-ray)  . RIGHT OOPHORECTOMY     due to STAPH INFECTION    Current Medications: Current Meds  Medication Sig  . acetaminophen (TYLENOL) 500 MG tablet Take 1,000 mg by mouth every 6 (six) hours as needed. Pain   . albuterol (PROVENTIL) (2.5 MG/3ML) 0.083% nebulizer solution Take 3 mLs (2.5 mg total) by  nebulization every 6 (six) hours as needed for wheezing or shortness of breath.  Marland Kitchen amiodarone (PACERONE) 200 MG tablet Take 1 tablet (200 mg) by mouth one daily as directed  . aspirin EC 81 MG tablet Take 1 tablet (81 mg total) by mouth daily. Swallow whole.  Marland Kitchen atorvastatin (LIPITOR) 40 MG tablet Take 1 tablet (40 mg total) by mouth daily.  . carvedilol (COREG) 6.25 MG tablet Take 1 tablet (6.25 mg total) by mouth 2 (two) times daily with a meal.  . furosemide (LASIX) 20 MG tablet Take 1 tablet (20 mg total) by mouth daily. Extra lasix after lunch for ankle swelling  . glucose blood (CONTOUR NEXT TEST) test strip Check sugar once daily  DX E11.9  . hydrOXYzine (ATARAX/VISTARIL) 10 MG tablet TAKE 1 TO 2 TABLETS(10 TO 20 MG) BY MOUTH EVERY 6 HOURS AS NEEDED  . levothyroxine (SYNTHROID) 100 MCG tablet TAKE 1 TABLET(100 MCG) BY MOUTH DAILY BEFORE BREAKFAST  . losartan (COZAAR) 25 MG tablet Take 1 tablet (25 mg total) by mouth every evening.  . metFORMIN (GLUCOPHAGE) 1000 MG tablet TAKE 1 TABLET(1000 MG) BY MOUTH DAILY WITH SUPPER  . omeprazole (PRILOSEC) 20 MG capsule TAKE 1 CAPSULE(20 MG) BY MOUTH DAILY  . tiotropium (SPIRIVA) 18 MCG inhalation capsule Place 1 capsule (18 mcg total) into inhaler and inhale daily as needed.     Allergies:   Oxycodone and Prednisone   Social History   Socioeconomic History  . Marital status: Widowed    Spouse name: Not on file  . Number of children: Not on file  . Years of education: Not on file  . Highest education level: Not on file  Occupational History  . Not on file  Tobacco Use  . Smoking status: Former Smoker    Packs/day: 0.50    Years: 50.00    Pack years: 25.00    Types: Cigarettes    Quit date: 05/16/2004    Years since quitting: 15.9  . Smokeless tobacco: Never Used  Vaping Use  . Vaping Use: Never used  Substance and Sexual Activity  . Alcohol use: Yes    Comment: socially-1 glass of wine or [place] icea tea once a month  . Drug  use: No  . Sexual activity: Yes    Birth control/protection: None  Other Topics Concern  . Not on file  Social History Narrative  . Not on file   Social Determinants of Health   Financial Resource Strain: Not on file  Food Insecurity: Not on file  Transportation Needs: Not on file  Physical Activity: Not on file  Stress: Not on file  Social Connections: Not on file     Family History: The patient's family history includes Arthritis in her son; Breast cancer in her maternal aunt; Dementia in her mother; Heart disease in her father; Hypertension in her sister; Lung cancer in her father.  ROS:   Please see the history of present illness.  All other systems reviewed and are negative.  EKGs/Labs/Other Studies Reviewed:    The following studies were reviewed today:   04/20/2020 Zio personally reviewed   Above strips appear to demonstrate narrow complex tachycardia with a short RP.  It looks as if this could be an AVNRT versus less likely atrial tachycardia.  Also possible is in atrial flutter although I do not see clear flutter waves.     EKG:  The ekg ordered today demonstrates sinus rhythm with left bundle branch block  Recent Labs: 07/29/2019: TSH 2.340 03/26/2020: B Natriuretic Peptide 337.8 04/13/2020: ALT 12; BUN 21; Creatinine, Ser 0.76; Hemoglobin 11.6; Platelets 369; Potassium 4.6; Sodium 140  Recent Lipid Panel    Component Value Date/Time   CHOL 294 (H) 04/13/2020 0909   CHOL 167 07/29/2019 0936   TRIG 242 (H) 04/13/2020 0909   HDL 50 04/13/2020 0909   HDL 59 07/29/2019 0936   CHOLHDL 5.9 04/13/2020 0909   VLDL 48 (H) 04/13/2020 0909   LDLCALC 196 (H) 04/13/2020 0909   LDLCALC 81 07/29/2019 0936    Physical Exam:    VS:  BP 116/60 (BP Location: Left Arm, Patient Position: Sitting, Cuff Size: Normal)   Pulse 87   Ht 5\' 4"  (1.626 m)   Wt 173 lb (78.5 kg)   SpO2 98%   BMI 29.70 kg/m     Wt Readings from Last 3 Encounters:  05/11/20 173 lb (78.5 kg)   04/18/20 176 lb (79.8 kg)  04/12/20 172 lb (78 kg)     GEN:  Well nourished, well developed in no acute distress HEENT: Normal NECK: No JVD; No carotid bruits LYMPHATICS: No lymphadenopathy CARDIAC: RRR, no murmurs, rubs, gallops RESPIRATORY:  Clear to auscultation without rales, wheezing or rhonchi  ABDOMEN: Soft, non-tender, non-distended MUSCULOSKELETAL:  No edema; No deformity  SKIN: Warm and dry NEUROLOGIC:  Alert and oriented x 3 PSYCHIATRIC:  Normal affect   ASSESSMENT:    1. PSVT (paroxysmal supraventricular tachycardia) ([place])   2. Complete left bundle branch block (LBBB)    PLAN:    In order of problems listed above:  1. Patient with highly symptomatic episode of PSVT.  This is been captured on ZIO monitor.  I discussed the management options for treating her SVT including the addition of antiarrhythmic therapy versus EP study and ablation.  The patient is not interested in upfront ablation at this time.  She would like to try medical therapy first which I think is very reasonable.  Given her age, medical comorbidities and presence of left bundle branch block on EKG, I have decided to start the patient on amiodarone.  We will plan on loading amiodarone now.  I will plan to see the patient back in approximately 3 months for repeat labs including complete metabolic panel, TSH and free T4.  If the patient has recurrent severe episodes of SVT, could consider EP study with ablation. 2. Left bundle branch block on baseline EKG.    Medication Adjustments/Labs and Tests Ordered: Current medicines are reviewed at length with the patient today.  Concerns regarding medicines are outlined above.  Orders Placed This Encounter  Procedures  . EKG 12-Lead   Meds ordered this encounter  Medications  . amiodarone (PACERONE) 200 MG tablet    Sig: Take 1 tablet (200 mg) by mouth one daily as directed    Dispense:  90 tablet    Refill:  1     Signed, Lars Mage, MD, [facility]   05/11/2020 9:07  PM    Electrophysiology [facility]

## 2020-05-11 NOTE — Patient Instructions (Addendum)
Medication Instructions:  - Your physician has recommended you make the following change in your medication:   1) START amiodarone: 200 mg  take 2 tablets (400 mg) by mouth once daily  X 2 weeks, then Take 1 tablet (200 mg) by mouth once daily   *If you need a refill on your cardiac medications before your next appointment, please call your pharmacy*   Lab Work: - We will draw labs at your next office visit: CMET/ TSH/ Free T4  If you have labs (blood work) drawn today and your tests are completely normal, you will receive your results only by: Marland Kitchen MyChart Message (if you have MyChart) OR . A paper copy in the mail If you have any lab test that is abnormal or we need to change your treatment, we will call you to review the results.   Testing/Procedures: - none ordered   Follow-Up: At [facility], you and your health needs are our priority.  As part of our continuing mission to provide you with exceptional heart care, we have created designated Provider Care Teams.  These Care Teams include your primary Cardiologist (physician) and Advanced Practice Providers (APPs -  Physician Assistants and Nurse Practitioners) who all work together to provide you with the care you need, when you need it.  We recommend signing up for the patient portal called "MyChart".  Sign up information is provided on this After Visit Summary.  MyChart is used to connect with patients for Virtual Visits (Telemedicine).  Patients are able to view lab/test results, encounter notes, upcoming appointments, etc.  Non-urgent messages can be sent to your provider as well.   To learn more about what you can do with MyChart, go to [email or web address].    Your next appointment:   3 month(s)  The format for your next appointment:   In Person  Provider:   Lars Mage, MD   Other Instructions  Amiodarone tablets What is this medicine? AMIODARONE (a MEE oh da rone) is an antiarrhythmic drug. It helps make  your heart beat regularly. Because of the side effects caused by this medicine, it is only used when other medicines have not worked. It is usually used for heartbeat problems that may be life threatening. This medicine may be used for other purposes; ask your health care provider or pharmacist if you have questions. COMMON BRAND NAME(S): Cordarone, Pacerone What should I tell my health care provider before I take this medicine? They need to know if you have any of these conditions:  liver disease  lung disease  other heart problems  thyroid disease  an unusual or allergic reaction to amiodarone, iodine, other medicines, foods, dyes, or preservatives  pregnant or trying to get pregnant  breast-feeding How should I use this medicine? Take this medicine by mouth with a glass of water. Follow the directions on the prescription label. You can take this medicine with or without food. However, you should always take it the same way each time. Take your doses at regular intervals. Do not take your medicine more often than directed. Do not stop taking except on the advice of your doctor or health care professional. A special MedGuide will be given to you by the pharmacist with each prescription and refill. Be sure to read this information carefully each time. Talk to your pediatrician regarding the use of this medicine in children. Special care may be needed. Overdosage: If you think you have taken too much of this medicine contact a poison control  center or emergency room at once. NOTE: This medicine is only for you. Do not share this medicine with others. What if I miss a dose? If you miss a dose, take it as soon as you can. If it is almost time for your next dose, take only that dose. Do not take double or extra doses. What may interact with this medicine? Do not take this medicine with any of the following medications:  abarelix  apomorphine  arsenic trioxide  certain antibiotics like  erythromycin, gemifloxacin, levofloxacin, pentamidine  certain medicines for depression like amoxapine, tricyclic antidepressants  certain medicines for fungal infections like fluconazole, itraconazole, ketoconazole, posaconazole, voriconazole  certain medicines for irregular heart beat like disopyramide, dronedarone, ibutilide, propafenone, sotalol  certain medicines for malaria like chloroquine, halofantrine  cisapride  droperidol  haloperidol  [place]  maprotiline  methadone  phenothiazines like chlorpromazine, mesoridazine, thioridazine  pimozide  ranolazine  red yeast rice  vardenafil This medicine may also interact with the following medications:  antiviral medicines for HIV or AIDS  certain medicines for blood pressure, heart disease, irregular heart beat  certain medicines for cholesterol like atorvastatin, cerivastatin, lovastatin, simvastatin  certain medicines for hepatitis C like sofosbuvir and ledipasvir; sofosbuvir  certain medicines for seizures like phenytoin  certain medicines for thyroid problems  certain medicines that treat or prevent blood clots like warfarin  cholestyramine  cimetidine  clopidogrel  cyclosporine  dextromethorphan  diuretics  dofetilide  fentanyl  general anesthetics  grapefruit juice  lidocaine  loratadine  methotrexate  other medicines that prolong the QT interval (cause an abnormal heart rhythm)  procainamide  quinidine  rifabutin, rifampin, or rifapentine  St. John's Wort  trazodone  ziprasidone This list may not describe all possible interactions. Give your health care provider a list of all the medicines, herbs, non-prescription drugs, or dietary supplements you use. Also tell them if you smoke, drink alcohol, or use illegal drugs. Some items may interact with your medicine. What should I watch for while using this medicine? Your condition will be monitored closely when you first begin  therapy. Often, this drug is first started in a hospital or other monitored health care setting. Once you are on maintenance therapy, visit your doctor or health care professional for regular checks on your progress. Because your condition and use of this medicine carry some risk, it is a good idea to carry an identification card, necklace or bracelet with details of your condition, medications, and doctor or health care professional. Dennis Bast may get drowsy or dizzy. Do not drive, use machinery, or do anything that needs mental alertness until you know how this medicine affects you. Do not stand or sit up quickly, especially if you are an older patient. This reduces the risk of dizzy or fainting spells. This medicine can make you more sensitive to the sun. Keep out of the sun. If you cannot avoid being in the sun, wear protective clothing and use sunscreen. Do not use sun lamps or tanning beds/booths. You should have regular eye exams before and during treatment. Call your doctor if you have blurred vision, see halos, or your eyes become sensitive to light. Your eyes may get dry. It may be helpful to use a lubricating eye solution or artificial tears solution. If you are going to have surgery or a procedure that requires contrast dyes, tell your doctor or health care professional that you are taking this medicine. What side effects may I notice from receiving this medicine? Side effects that  you should report to your doctor or health care professional as soon as possible:  allergic reactions like skin rash, itching or hives, swelling of the face, lips, or tongue  blue-gray coloring of the skin  blurred vision, seeing blue green halos, increased sensitivity of the eyes to light  breathing problems  chest pain  dark urine  fast, irregular heartbeat  feeling faint or light-headed  intolerance to heat or cold  nausea or vomiting  pain and swelling of the scrotum  pain, tingling, numbness in  feet, hands  redness, blistering, peeling or loosening of the skin, including inside the mouth  spitting up blood  stomach pain  sweating  unusual or uncontrolled movements of body  unusually weak or tired  weight gain or loss  yellowing of the eyes or skin Side effects that usually do not require medical attention (report to your doctor or health care professional if they continue or are bothersome):  change in sex drive or performance  constipation  dizziness  headache  loss of appetite  trouble sleeping This list may not describe all possible side effects. Call your doctor for medical advice about side effects. You may report side effects to FDA at 1-800-FDA-1088. Where should I keep my medicine? Keep out of the reach of children. Store at room temperature between 20 and 25 degrees C (68 and 77 degrees F). Protect from light. Keep container tightly closed. Throw away any unused medicine after the expiration date. NOTE: This sheet is a summary. It may not cover all possible information. If you have questions about this medicine, talk to your doctor, pharmacist, or health care provider.  2021 Elsevier/Gold Standard (2018-02-12 13:44:04)

## 2020-05-14 NOTE — Progress Notes (Signed)
Patient ID: Kathryn [name], female    DOB: 07/29/1933, 87 y.o.   MRN: [id number]  HPI  [name] is a 87 y/o female with a history of atrial fibrillation, DM, hyperlipidemia, HTN, thyroid disease, PVD, gastric ulcer, COPD, previous tobacco use and chronic heart failure.   Echo report from 03/28/20 reviewed and showed an EF of 40-45% along with mild LAE and mild MR/ AR.   Admitted 03/26/20 due to new onset HF. Initially needed bipap but then able to be weaned down to 3L nasal cannula. Cardiology consult obtained. Initially given IV lasix with transition to oral diuretics. Discharged after 3 days.   She presents today for a follow-up visit with a chief complaint of moderate shortness of breath with little exertion. She describes this as chronic in nature having been present for several years. She has associated fatigue, pedal edema, chronic pain, anxiety and difficulty sleeping along with this. She denies any dizziness, abdominal distention, palpitations, chest pain, cough or weight gain.   She has been recently been started on amiodarone and reports tolerating it without any known issues. Says that she has worn compression socks in the past but had great difficulty in getting them on and says that now she has a lot less strength in her fingers so doesn't feel like she could get them on at all. Says that the swelling usually goes down overnight after her legs have been in the bed overnight.   Past Medical History:  Diagnosis Date  . Actinic keratosis   . Arthritis    spinal stenosis  . CHF (congestive heart failure) ([place])   . COPD (chronic obstructive pulmonary disease) ([place])   . Diabetes mellitus without complication ([place])   . Gastric ulcer 4/12   treated with Prolisec- states no problems now  . Hyperlipidemia   . Hypertension    PCP Dr Miguel Aschoff   [place]  . Hypothyroidism   . Left bundle branch block (LBBB)    atrial fibrillation  . Neuromuscular disorder ([place])    slight  numbness right toes- comes and goes  . Peripheral vascular disease ([facility])    varicose veins left leg  . Pneumonia   . Seasonal allergies   . Shortness of breath   . Skin cancer    multiple from face  . Squamous cell carcinoma of skin 03/23/2013   L dorsal hand  . Squamous cell carcinoma of skin 02/04/2014   R forearm/in situ, L pretibial  . Squamous cell carcinoma of skin 09/24/2018   R thumb   Past Surgical History:  Procedure Laterality Date  . BACK SURGERY     4/12  Dr Shellia Carwin- for lumbar stenosis  . BREAST CYST ASPIRATION Left 1965   negative  . BREAST SURGERY  1962   lumpectomy with biopsy   left  . CARPAL TUNNEL RELEASE     right  . COLONOSCOPY    . COLONOSCOPY WITH PROPOFOL N/A 06/30/2015   Procedure: COLONOSCOPY WITH PROPOFOL;  Surgeon: Hulen Luster, MD;  Location: [facility] ENDOSCOPY;  Service: Gastroenterology;  Laterality: N/A;  . COLONOSCOPY WITH PROPOFOL N/A 01/22/2017   Procedure: COLONOSCOPY WITH PROPOFOL;  Surgeon: Lollie Sails, MD;  Location: [facility] ENDOSCOPY;  Service: Endoscopy;  Laterality: N/A;  . COLONOSCOPY WITH PROPOFOL N/A 01/28/2019   Procedure: COLONOSCOPY WITH PROPOFOL;  Surgeon: Toledo, Benay Pike, MD;  Location: [facility] ENDOSCOPY;  Service: Gastroenterology;  Laterality: N/A;  . ESOPHAGOGASTRODUODENOSCOPY    . ESOPHAGOGASTRODUODENOSCOPY (EGD) WITH PROPOFOL N/A 01/28/2019  Procedure: ESOPHAGOGASTRODUODENOSCOPY (EGD) WITH PROPOFOL;  Surgeon: Toledo, Benay Pike, MD;  Location: [facility] ENDOSCOPY;  Service: Gastroenterology;  Laterality: N/A;  . EYE SURGERY     cataract extraction with IOL bilaterally  . JOINT REPLACEMENT    . LUMBAR LAMINECTOMY/DECOMPRESSION MICRODISCECTOMY  05/24/2011   Procedure: LUMBAR LAMINECTOMY/DECOMPRESSION MICRODISCECTOMY 2 LEVELS;  Surgeon: Magnus Sinning, MD;  Location: [facility] ORS;  Service: Orthopedics;  Laterality: N/A;  L2-L3, L3-L4 (x-ray)  . RIGHT OOPHORECTOMY     due to STAPH INFECTION   Family History  Problem Relation Age of  Onset  . Dementia Mother   . Lung cancer Father   . Heart disease Father   . Hypertension Sister   . Breast cancer Maternal Aunt   . Arthritis Son    Social History   Tobacco Use  . Smoking status: Former Smoker    Packs/day: 0.50    Years: 50.00    Pack years: 25.00    Types: Cigarettes    Quit date: 05/16/2004    Years since quitting: 16.0  . Smokeless tobacco: Never Used  Substance Use Topics  . Alcohol use: Yes    Comment: socially-1 glass of wine or [place] icea tea once a month   Allergies  Allergen Reactions  . Oxycodone Other (See Comments)    Mental status change  . Prednisone     swelling, bruising.   Prior to Admission medications   Medication Sig Start Date End Date Taking? Authorizing Provider  acetaminophen (TYLENOL) 500 MG tablet Take 1,000 mg by mouth every 6 (six) hours as needed. Pain    Yes [provider]  albuterol (PROVENTIL) (2.5 MG/3ML) 0.083% nebulizer solution Take 3 mLs (2.5 mg total) by nebulization every 6 (six) hours as needed for wheezing or shortness of breath. 04/05/20  Yes Jerrol Banana., MD  amiodarone (PACERONE) 200 MG tablet Take 1 tablet (200 mg) by mouth one daily as directed 05/11/20  Yes Vickie Epley, MD  aspirin EC 81 MG tablet Take 1 tablet (81 mg total) by mouth daily. Swallow whole. 04/18/20  Yes Visser, Jacquelyn D, PA-C  atorvastatin (LIPITOR) 40 MG tablet Take 1 tablet (40 mg total) by mouth daily. 04/18/20  Yes Minna Merritts, MD  carvedilol (COREG) 6.25 MG tablet Take 1 tablet (6.25 mg total) by mouth 2 (two) times daily with a meal. 04/27/20 04/22/21 Yes Visser, Jacquelyn D, PA-C  furosemide (LASIX) 20 MG tablet Take 1 tablet (20 mg total) by mouth daily. Extra lasix after lunch for ankle swelling 04/18/20  Yes Gollan, Kathlene November, MD  glucose blood (CONTOUR NEXT TEST) test strip Check sugar once daily  DX E11.9 11/16/16  Yes Jerrol Banana., MD  hydrOXYzine (ATARAX/VISTARIL) 10 MG tablet TAKE 1 TO 2  TABLETS(10 TO 20 MG) BY MOUTH EVERY 6 HOURS AS NEEDED 04/26/20  Yes Jerrol Banana., MD  levothyroxine (SYNTHROID) 100 MCG tablet TAKE 1 TABLET(100 MCG) BY MOUTH DAILY BEFORE BREAKFAST 08/17/19  Yes Jerrol Banana., MD  losartan (COZAAR) 25 MG tablet Take 1 tablet (25 mg total) by mouth every evening. 03/29/20  Yes Fritzi Mandes, MD  metFORMIN (GLUCOPHAGE) 1000 MG tablet TAKE 1 TABLET(1000 MG) BY MOUTH DAILY WITH SUPPER 05/08/20  Yes Jerrol Banana., MD  omeprazole (PRILOSEC) 20 MG capsule TAKE 1 CAPSULE(20 MG) BY MOUTH DAILY 04/05/20  Yes Jerrol Banana., MD  tiotropium [facility]) 18 MCG inhalation capsule Place 1 capsule (18 mcg total) into inhaler  and inhale daily as needed. 04/05/20  Yes Jerrol Banana., MD    Review of Systems  Constitutional: Positive for fatigue. Negative for appetite change.  HENT: Negative for congestion, rhinorrhea and sore throat.   Eyes: Negative.   Respiratory: Positive for shortness of breath (easily). Negative for cough and chest tightness.   Cardiovascular: Positive for leg swelling (ankles). Negative for chest pain and palpitations.  Gastrointestinal: Negative for abdominal distention and abdominal pain.  Endocrine: Negative.   Genitourinary: Negative.   Musculoskeletal: Positive for arthralgias (shoulders) and back pain.  Skin: Negative.   Allergic/Immunologic: Negative.   Neurological: Negative for dizziness and light-headedness.  Hematological: Negative for adenopathy. Does not bruise/bleed easily.  Psychiatric/Behavioral: Positive for sleep disturbance (sleeping on 1-2 pillows). Negative for dysphoric mood. The patient is nervous/anxious.    Vitals:   05/16/20 1158  BP: 123/73  Pulse: 78  Resp: 18  SpO2: 100%  Weight: 175 lb 8 oz (79.6 kg)  Height: 5\' 4"  (1.626 m)   Wt Readings from Last 3 Encounters:  05/16/20 175 lb 8 oz (79.6 kg)  05/11/20 173 lb (78.5 kg)  04/18/20 176 lb (79.8 kg)   Lab Results  Component Value  Date   CREATININE 0.76 04/13/2020   CREATININE 0.85 03/28/2020   CREATININE 0.83 03/27/2020    Physical Exam Vitals and nursing note reviewed. Exam conducted with a chaperone present (son).  Constitutional:      Appearance: She is well-developed.  HENT:     Head: Normocephalic and atraumatic.  Neck:     Vascular: No JVD.  Cardiovascular:     Rate and Rhythm: Normal rate and regular rhythm.  Pulmonary:     Effort: Pulmonary effort is normal. No respiratory distress.     Breath sounds: No wheezing or rales.  Abdominal:     Palpations: Abdomen is soft.     Tenderness: There is no abdominal tenderness.  Musculoskeletal:     Cervical back: Neck supple.     Right lower leg: No tenderness. Edema (1+ pitting) present.     Left lower leg: No tenderness. Edema (1+ pitting) present.  Skin:    General: Skin is warm and dry.  Neurological:     General: No focal deficit present.     Mental Status: She is alert and oriented to person, place, and time.  Psychiatric:        Mood and Affect: Mood normal.        Behavior: Behavior normal.    Assessment & Plan:  1: Chronic heart failure with reduced ejection fraction- - NYHA class III - euvolemic today - weighing daily and reminded to call for an overnight weight gain of > 2 pounds or a weekly weight gain of >5 pounds - weight up 3 pounds from last visit here 1 month ago - not adding much salt now but admits to "loving salt"; has been trying to limit adding salt as well as looking at food labels for sodium content - saw [facility] cardiology Rockey Situ) 04/18/20 - encouraged her and her son to get her the zip up compression socks to make it easier for her to put them on - BNP 03/26/20 was 337.8 - reports receiving 3 covid vaccines (including booster) - has received her flu vaccine for this season  2: HTN- - BP looks good today - saw PCP Rosanna Randy) 04/05/20 - BMP 04/13/20 reviewed and showed sodium 140, potassium 4.6, creatinine 0.76 and GFR  >60  3: Diabetes- - A1c 03/27/20  was 6.9%  4: PSVT- - saw EP Quentin Ore) 05/11/20 due to SVT on zio monitor - amiodarone was started - possible ablation in the future if needed   Patient did not bring her medications nor a list. Each medication was verbally reviewed with the patient and she was encouraged to bring the bottles to every visit to confirm accuracy of list.  Due to HF stability, will not make a return appointment for patient at this time. Advised patient that she could call back at anytime to schedule another appointment and that she needs to maintain close contact with her cardiologist, EP provider and PCP with verbal understanding from the patient.

## 2020-05-16 ENCOUNTER — Other Ambulatory Visit: Payer: Self-pay

## 2020-05-16 ENCOUNTER — Ambulatory Visit: Payer: Medicare Other | Attending: Family | Admitting: Family

## 2020-05-16 ENCOUNTER — Encounter: Payer: Self-pay | Admitting: Family

## 2020-05-16 VITALS — BP 123/73 | HR 78 | Resp 18 | Ht 64.0 in | Wt 175.5 lb

## 2020-05-16 DIAGNOSIS — Z87891 Personal history of nicotine dependence: Secondary | ICD-10-CM | POA: Insufficient documentation

## 2020-05-16 DIAGNOSIS — Z79899 Other long term (current) drug therapy: Secondary | ICD-10-CM | POA: Insufficient documentation

## 2020-05-16 DIAGNOSIS — G8929 Other chronic pain: Secondary | ICD-10-CM | POA: Diagnosis not present

## 2020-05-16 DIAGNOSIS — Z7984 Long term (current) use of oral hypoglycemic drugs: Secondary | ICD-10-CM | POA: Diagnosis not present

## 2020-05-16 DIAGNOSIS — Z8249 Family history of ischemic heart disease and other diseases of the circulatory system: Secondary | ICD-10-CM | POA: Insufficient documentation

## 2020-05-16 DIAGNOSIS — Z7982 Long term (current) use of aspirin: Secondary | ICD-10-CM | POA: Insufficient documentation

## 2020-05-16 DIAGNOSIS — I471 Supraventricular tachycardia: Secondary | ICD-10-CM | POA: Diagnosis not present

## 2020-05-16 DIAGNOSIS — I4891 Unspecified atrial fibrillation: Secondary | ICD-10-CM | POA: Insufficient documentation

## 2020-05-16 DIAGNOSIS — J449 Chronic obstructive pulmonary disease, unspecified: Secondary | ICD-10-CM | POA: Diagnosis not present

## 2020-05-16 DIAGNOSIS — Z888 Allergy status to other drugs, medicaments and biological substances status: Secondary | ICD-10-CM | POA: Diagnosis not present

## 2020-05-16 DIAGNOSIS — F419 Anxiety disorder, unspecified: Secondary | ICD-10-CM | POA: Diagnosis not present

## 2020-05-16 DIAGNOSIS — E785 Hyperlipidemia, unspecified: Secondary | ICD-10-CM | POA: Diagnosis not present

## 2020-05-16 DIAGNOSIS — E039 Hypothyroidism, unspecified: Secondary | ICD-10-CM | POA: Diagnosis not present

## 2020-05-16 DIAGNOSIS — I110 Hypertensive heart disease with heart failure: Secondary | ICD-10-CM | POA: Diagnosis not present

## 2020-05-16 DIAGNOSIS — Z79890 Hormone replacement therapy (postmenopausal): Secondary | ICD-10-CM | POA: Diagnosis not present

## 2020-05-16 DIAGNOSIS — E1151 Type 2 diabetes mellitus with diabetic peripheral angiopathy without gangrene: Secondary | ICD-10-CM | POA: Diagnosis not present

## 2020-05-16 DIAGNOSIS — Z885 Allergy status to narcotic agent status: Secondary | ICD-10-CM | POA: Diagnosis not present

## 2020-05-16 DIAGNOSIS — E119 Type 2 diabetes mellitus without complications: Secondary | ICD-10-CM

## 2020-05-16 DIAGNOSIS — I5022 Chronic systolic (congestive) heart failure: Secondary | ICD-10-CM | POA: Diagnosis not present

## 2020-05-16 DIAGNOSIS — Z8711 Personal history of peptic ulcer disease: Secondary | ICD-10-CM | POA: Diagnosis not present

## 2020-05-16 DIAGNOSIS — I10 Essential (primary) hypertension: Secondary | ICD-10-CM

## 2020-05-16 NOTE — Patient Instructions (Addendum)
Continue weighing daily and call for an overnight weight gain of > 2 pounds or a weekly weight gain of >5 pounds.   Call us in the future if you'd like to schedule another appointment 

## 2020-05-17 ENCOUNTER — Encounter: Payer: Self-pay | Admitting: Family

## 2020-05-24 ENCOUNTER — Emergency Department: Payer: Medicare Other

## 2020-05-24 ENCOUNTER — Inpatient Hospital Stay
Admission: EM | Admit: 2020-05-24 | Discharge: 2020-05-27 | DRG: 291 | Disposition: A | Discharge status: Home / Self Care | Payer: Medicare Other | Attending: Family Medicine | Admitting: Family Medicine

## 2020-05-24 ENCOUNTER — Encounter: Payer: Self-pay | Admitting: Medical Oncology

## 2020-05-24 ENCOUNTER — Other Ambulatory Visit: Payer: Self-pay

## 2020-05-24 DIAGNOSIS — Z8711 Personal history of peptic ulcer disease: Secondary | ICD-10-CM

## 2020-05-24 DIAGNOSIS — R0902 Hypoxemia: Secondary | ICD-10-CM | POA: Diagnosis not present

## 2020-05-24 DIAGNOSIS — N179 Acute kidney failure, unspecified: Secondary | ICD-10-CM

## 2020-05-24 DIAGNOSIS — J811 Chronic pulmonary edema: Secondary | ICD-10-CM | POA: Diagnosis not present

## 2020-05-24 DIAGNOSIS — Z8679 Personal history of other diseases of the circulatory system: Secondary | ICD-10-CM | POA: Diagnosis not present

## 2020-05-24 DIAGNOSIS — Z79890 Hormone replacement therapy (postmenopausal): Secondary | ICD-10-CM | POA: Diagnosis not present

## 2020-05-24 DIAGNOSIS — J9601 Acute respiratory failure with hypoxia: Secondary | ICD-10-CM | POA: Diagnosis not present

## 2020-05-24 DIAGNOSIS — I509 Heart failure, unspecified: Secondary | ICD-10-CM | POA: Diagnosis not present

## 2020-05-24 DIAGNOSIS — R7989 Other specified abnormal findings of blood chemistry: Secondary | ICD-10-CM | POA: Diagnosis not present

## 2020-05-24 DIAGNOSIS — I429 Cardiomyopathy, unspecified: Secondary | ICD-10-CM | POA: Diagnosis present

## 2020-05-24 DIAGNOSIS — I472 Ventricular tachycardia: Secondary | ICD-10-CM | POA: Diagnosis not present

## 2020-05-24 DIAGNOSIS — Z8249 Family history of ischemic heart disease and other diseases of the circulatory system: Secondary | ICD-10-CM

## 2020-05-24 DIAGNOSIS — J302 Other seasonal allergic rhinitis: Secondary | ICD-10-CM | POA: Diagnosis present

## 2020-05-24 DIAGNOSIS — J189 Pneumonia, unspecified organism: Secondary | ICD-10-CM | POA: Diagnosis not present

## 2020-05-24 DIAGNOSIS — Z90721 Acquired absence of ovaries, unilateral: Secondary | ICD-10-CM

## 2020-05-24 DIAGNOSIS — Z66 Do not resuscitate: Secondary | ICD-10-CM | POA: Diagnosis present

## 2020-05-24 DIAGNOSIS — I10 Essential (primary) hypertension: Secondary | ICD-10-CM | POA: Diagnosis present

## 2020-05-24 DIAGNOSIS — Z85828 Personal history of other malignant neoplasm of skin: Secondary | ICD-10-CM

## 2020-05-24 DIAGNOSIS — I447 Left bundle-branch block, unspecified: Secondary | ICD-10-CM | POA: Diagnosis not present

## 2020-05-24 DIAGNOSIS — J441 Chronic obstructive pulmonary disease with (acute) exacerbation: Secondary | ICD-10-CM | POA: Diagnosis present

## 2020-05-24 DIAGNOSIS — Z20822 Contact with and (suspected) exposure to covid-19: Secondary | ICD-10-CM | POA: Diagnosis present

## 2020-05-24 DIAGNOSIS — J9621 Acute and chronic respiratory failure with hypoxia: Secondary | ICD-10-CM | POA: Diagnosis not present

## 2020-05-24 DIAGNOSIS — R7881 Bacteremia: Secondary | ICD-10-CM | POA: Diagnosis present

## 2020-05-24 DIAGNOSIS — E1151 Type 2 diabetes mellitus with diabetic peripheral angiopathy without gangrene: Secondary | ICD-10-CM | POA: Diagnosis not present

## 2020-05-24 DIAGNOSIS — K219 Gastro-esophageal reflux disease without esophagitis: Secondary | ICD-10-CM | POA: Diagnosis present

## 2020-05-24 DIAGNOSIS — Z7951 Long term (current) use of inhaled steroids: Secondary | ICD-10-CM | POA: Diagnosis not present

## 2020-05-24 DIAGNOSIS — E119 Type 2 diabetes mellitus without complications: Secondary | ICD-10-CM

## 2020-05-24 DIAGNOSIS — E1165 Type 2 diabetes mellitus with hyperglycemia: Secondary | ICD-10-CM | POA: Diagnosis not present

## 2020-05-24 DIAGNOSIS — I110 Hypertensive heart disease with heart failure: Principal | ICD-10-CM | POA: Diagnosis present

## 2020-05-24 DIAGNOSIS — I4891 Unspecified atrial fibrillation: Secondary | ICD-10-CM | POA: Diagnosis present

## 2020-05-24 DIAGNOSIS — I700 Atherosclerosis of aorta: Secondary | ICD-10-CM

## 2020-05-24 DIAGNOSIS — E785 Hyperlipidemia, unspecified: Secondary | ICD-10-CM | POA: Diagnosis not present

## 2020-05-24 DIAGNOSIS — Z7982 Long term (current) use of aspirin: Secondary | ICD-10-CM | POA: Diagnosis not present

## 2020-05-24 DIAGNOSIS — Z7984 Long term (current) use of oral hypoglycemic drugs: Secondary | ICD-10-CM

## 2020-05-24 DIAGNOSIS — J80 Acute respiratory distress syndrome: Secondary | ICD-10-CM | POA: Diagnosis not present

## 2020-05-24 DIAGNOSIS — I471 Supraventricular tachycardia: Secondary | ICD-10-CM | POA: Diagnosis not present

## 2020-05-24 DIAGNOSIS — I5043 Acute on chronic combined systolic (congestive) and diastolic (congestive) heart failure: Secondary | ICD-10-CM | POA: Diagnosis not present

## 2020-05-24 DIAGNOSIS — E782 Mixed hyperlipidemia: Secondary | ICD-10-CM | POA: Diagnosis not present

## 2020-05-24 DIAGNOSIS — Z885 Allergy status to narcotic agent status: Secondary | ICD-10-CM

## 2020-05-24 DIAGNOSIS — M1990 Unspecified osteoarthritis, unspecified site: Secondary | ICD-10-CM | POA: Diagnosis present

## 2020-05-24 DIAGNOSIS — Z9111 Patient's noncompliance with dietary regimen: Secondary | ICD-10-CM

## 2020-05-24 DIAGNOSIS — R069 Unspecified abnormalities of breathing: Secondary | ICD-10-CM | POA: Diagnosis not present

## 2020-05-24 DIAGNOSIS — Z888 Allergy status to other drugs, medicaments and biological substances status: Secondary | ICD-10-CM

## 2020-05-24 DIAGNOSIS — Z8701 Personal history of pneumonia (recurrent): Secondary | ICD-10-CM

## 2020-05-24 DIAGNOSIS — J90 Pleural effusion, not elsewhere classified: Secondary | ICD-10-CM | POA: Diagnosis not present

## 2020-05-24 DIAGNOSIS — E039 Hypothyroidism, unspecified: Secondary | ICD-10-CM | POA: Diagnosis not present

## 2020-05-24 DIAGNOSIS — I5023 Acute on chronic systolic (congestive) heart failure: Secondary | ICD-10-CM | POA: Diagnosis not present

## 2020-05-24 DIAGNOSIS — Z79899 Other long term (current) drug therapy: Secondary | ICD-10-CM

## 2020-05-24 DIAGNOSIS — Z87891 Personal history of nicotine dependence: Secondary | ICD-10-CM

## 2020-05-24 DIAGNOSIS — R0689 Other abnormalities of breathing: Secondary | ICD-10-CM | POA: Diagnosis not present

## 2020-05-24 DIAGNOSIS — R0602 Shortness of breath: Secondary | ICD-10-CM | POA: Diagnosis not present

## 2020-05-24 HISTORY — DX: Atherosclerotic heart disease of native coronary artery without angina pectoris: I25.10

## 2020-05-24 HISTORY — DX: Supraventricular tachycardia, unspecified: I47.10

## 2020-05-24 HISTORY — DX: Supraventricular tachycardia: I47.1

## 2020-05-24 HISTORY — DX: Cardiomyopathy, unspecified: I42.9

## 2020-05-24 LAB — COMPREHENSIVE METABOLIC PANEL
ALT: 20 U/L (ref 0–44)
AST: 22 U/L (ref 15–41)
Albumin: 3.9 g/dL (ref 3.5–5.0)
Alkaline Phosphatase: 73 U/L (ref 38–126)
Anion gap: 10 (ref 5–15)
BUN: 28 mg/dL — ABNORMAL HIGH (ref 8–23)
CO2: 23 mmol/L (ref 22–32)
Calcium: 9.8 mg/dL (ref 8.9–10.3)
Chloride: 105 mmol/L (ref 98–111)
Creatinine, Ser: 0.91 mg/dL (ref 0.44–1.00)
GFR, Estimated: >60 mL/min (ref >60)
Glucose, Bld: 297 mg/dL — ABNORMAL HIGH (ref 70–99)
Potassium: 4.5 mmol/L (ref 3.5–5.1)
Sodium: 138 mmol/L (ref 135–145)
Total Bilirubin: 0.8 mg/dL (ref 0.3–1.2)
Total Protein: 6.6 g/dL (ref 6.5–8.1)

## 2020-05-24 LAB — RESP PANEL BY RT-PCR (FLU A&B, COVID) ARPGX2
Influenza A by PCR: NEGATIVE
Influenza B by PCR: NEGATIVE
SARS Coronavirus 2 by RT PCR: NEGATIVE

## 2020-05-24 LAB — CBC WITH DIFFERENTIAL/PLATELET
Abs Immature Granulocytes: 0.04 10*3/uL (ref 0.00–0.07)
Basophils Absolute: 0 10*3/uL (ref 0.0–0.1)
Basophils Relative: 1 %
Eosinophils Absolute: 0.2 10*3/uL (ref 0.0–0.5)
Eosinophils Relative: 4 %
HCT: 36.4 % (ref 36.0–46.0)
Hemoglobin: 10.6 g/dL — ABNORMAL LOW (ref 12.0–15.0)
Immature Granulocytes: 1 %
Lymphocytes Relative: 32 %
Lymphs Abs: 1.8 10*3/uL (ref 0.7–4.0)
MCH: 24.5 pg — ABNORMAL LOW (ref 26.0–34.0)
MCHC: 29.1 g/dL — ABNORMAL LOW (ref 30.0–36.0)
MCV: 84.3 fL (ref 80.0–100.0)
Monocytes Absolute: 0.6 10*3/uL (ref 0.1–1.0)
Monocytes Relative: 11 %
Neutro Abs: 2.8 10*3/uL (ref 1.7–7.7)
Neutrophils Relative %: 51 %
Platelets: 249 10*3/uL (ref 150–400)
RBC: 4.32 MIL/uL (ref 3.87–5.11)
RDW: 15.3 % (ref 11.5–15.5)
WBC: 5.4 10*3/uL (ref 4.0–10.5)
nRBC: 0 % (ref 0.0–0.2)

## 2020-05-24 LAB — URINALYSIS, COMPLETE (UACMP) WITH MICROSCOPIC
Bilirubin Urine: NEGATIVE
Glucose, UA: 150 mg/dL — AB
Hgb urine dipstick: NEGATIVE
Ketones, ur: NEGATIVE mg/dL
Nitrite: NEGATIVE
Protein, ur: NEGATIVE mg/dL
Specific Gravity, Urine: 1.016 (ref 1.005–1.030)
pH: 5 (ref 5.0–8.0)

## 2020-05-24 LAB — PROTIME-INR
INR: 1 (ref 0.8–1.2)
Prothrombin Time: 12.9 seconds (ref 11.4–15.2)

## 2020-05-24 LAB — CBG MONITORING, ED
Glucose-Capillary: 250 mg/dL — ABNORMAL HIGH (ref 70–99)
Glucose-Capillary: 289 mg/dL — ABNORMAL HIGH (ref 70–99)

## 2020-05-24 LAB — BLOOD GAS, VENOUS
Acid-Base Excess: 1.5 mmol/L (ref 0.0–2.0)
Bicarbonate: 28.7 mmol/L — ABNORMAL HIGH (ref 20.0–28.0)
O2 Saturation: 60.7 %
Patient temperature: 37
pCO2, Ven: 57 mmHg (ref 44.0–60.0)
pH, Ven: 7.31 (ref 7.250–7.430)
pO2, Ven: 35 mmHg (ref 32.0–45.0)

## 2020-05-24 LAB — D-DIMER, QUANTITATIVE: D-Dimer, Quant: 1.01 ug/mL-FEU — ABNORMAL HIGH (ref 0.00–0.50)

## 2020-05-24 LAB — APTT: aPTT: 32 seconds (ref 24–36)

## 2020-05-24 LAB — BRAIN NATRIURETIC PEPTIDE: B Natriuretic Peptide: 371.5 pg/mL — ABNORMAL HIGH (ref 0.0–100.0)

## 2020-05-24 LAB — TROPONIN I (HIGH SENSITIVITY): Troponin I (High Sensitivity): 10 ng/L (ref <18)

## 2020-05-24 LAB — POC SARS CORONAVIRUS 2 AG -  ED: SARS Coronavirus 2 Ag: NEGATIVE

## 2020-05-24 LAB — GLUCOSE, CAPILLARY: Glucose-Capillary: 339 mg/dL — ABNORMAL HIGH (ref 70–99)

## 2020-05-24 MED ORDER — INSULIN ASPART 100 UNIT/ML ~~LOC~~ SOLN
0.0000 [IU] | Freq: Three times a day (TID) | SUBCUTANEOUS | Status: DC
  Administered 2020-05-24: 5 [IU] via SUBCUTANEOUS
  Administered 2020-05-24: 3 [IU] via SUBCUTANEOUS
  Administered 2020-05-25 – 2020-05-26 (×3): 2 [IU] via SUBCUTANEOUS
  Administered 2020-05-26: 3 [IU] via SUBCUTANEOUS
  Administered 2020-05-27: 2 [IU] via SUBCUTANEOUS
  Administered 2020-05-27: 1 [IU] via SUBCUTANEOUS
  Filled 2020-05-24 (×7): qty 1

## 2020-05-24 MED ORDER — ACETAMINOPHEN 325 MG PO TABS: 650.0000 mg | ORAL_TABLET | Freq: Four times a day (QID) | ORAL | Status: DC | PRN

## 2020-05-24 MED ORDER — DM-GUAIFENESIN ER 30-600 MG PO TB12
1.0000 | ORAL_TABLET | Freq: Two times a day (BID) | ORAL | Status: DC | PRN
  Administered 2020-05-25 – 2020-05-27 (×3): 1 via ORAL
  Filled 2020-05-24 (×3): qty 1

## 2020-05-24 MED ORDER — LEVOTHYROXINE SODIUM 100 MCG PO TABS
100.0000 ug | ORAL_TABLET | Freq: Every day | ORAL | Status: DC
  Administered 2020-05-25 – 2020-05-27 (×3): 100 ug via ORAL
  Filled 2020-05-24 (×3): qty 1

## 2020-05-24 MED ORDER — FUROSEMIDE 10 MG/ML IJ SOLN
40.0000 mg | Freq: Once | INTRAMUSCULAR | Status: AC
  Administered 2020-05-24: 40 mg via INTRAVENOUS
  Filled 2020-05-24: qty 4

## 2020-05-24 MED ORDER — SODIUM CHLORIDE 0.9 % IV SOLN: 250.0000 mL | INTRAVENOUS | Status: DC | PRN

## 2020-05-24 MED ORDER — IOHEXOL 350 MG/ML SOLN
75.0000 mL | Freq: Once | INTRAVENOUS | Status: AC | PRN
  Administered 2020-05-24: 75 mL via INTRAVENOUS

## 2020-05-24 MED ORDER — INSULIN ASPART 100 UNIT/ML ~~LOC~~ SOLN
0.0000 [IU] | Freq: Every day | SUBCUTANEOUS | Status: DC
  Administered 2020-05-24: 4 [IU] via SUBCUTANEOUS
  Filled 2020-05-24: qty 1

## 2020-05-24 MED ORDER — ENOXAPARIN SODIUM 40 MG/0.4ML ~~LOC~~ SOLN
40.0000 mg | SUBCUTANEOUS | Status: DC
  Administered 2020-05-24 – 2020-05-25 (×2): 40 mg via SUBCUTANEOUS
  Filled 2020-05-24 (×2): qty 0.4

## 2020-05-24 MED ORDER — ATORVASTATIN CALCIUM 20 MG PO TABS
40.0000 mg | ORAL_TABLET | Freq: Every day | ORAL | Status: DC
  Administered 2020-05-24 – 2020-05-27 (×4): 40 mg via ORAL
  Filled 2020-05-24 (×4): qty 2

## 2020-05-24 MED ORDER — SODIUM CHLORIDE 0.9% FLUSH
3.0000 mL | Freq: Two times a day (BID) | INTRAVENOUS | Status: DC
  Administered 2020-05-24 – 2020-05-27 (×7): 3 mL via INTRAVENOUS

## 2020-05-24 MED ORDER — FUROSEMIDE 10 MG/ML IJ SOLN
40.0000 mg | Freq: Two times a day (BID) | INTRAMUSCULAR | Status: DC
  Administered 2020-05-24 – 2020-05-25 (×3): 40 mg via INTRAVENOUS
  Filled 2020-05-24 (×3): qty 4

## 2020-05-24 MED ORDER — LOSARTAN POTASSIUM 25 MG PO TABS
25.0000 mg | ORAL_TABLET | Freq: Every evening | ORAL | Status: DC
Start: 2020-05-24 — End: 2020-05-26
  Administered 2020-05-24 – 2020-05-25 (×2): 25 mg via ORAL
  Filled 2020-05-24 (×2): qty 1

## 2020-05-24 MED ORDER — PANTOPRAZOLE SODIUM 40 MG PO TBEC
40.0000 mg | DELAYED_RELEASE_TABLET | Freq: Every day | ORAL | Status: DC
  Administered 2020-05-24 – 2020-05-27 (×4): 40 mg via ORAL
  Filled 2020-05-24 (×4): qty 1

## 2020-05-24 MED ORDER — SODIUM CHLORIDE 0.9% FLUSH: 3.0000 mL | INTRAVENOUS | Status: DC | PRN

## 2020-05-24 MED ORDER — ASPIRIN EC 81 MG PO TBEC
81.0000 mg | DELAYED_RELEASE_TABLET | Freq: Every day | ORAL | Status: DC
  Administered 2020-05-25 – 2020-05-27 (×3): 81 mg via ORAL
  Filled 2020-05-24 (×3): qty 1

## 2020-05-24 MED ORDER — CARVEDILOL 6.25 MG PO TABS
6.2500 mg | ORAL_TABLET | Freq: Two times a day (BID) | ORAL | Status: DC
  Administered 2020-05-24 – 2020-05-27 (×6): 6.25 mg via ORAL
  Filled 2020-05-24 (×6): qty 1

## 2020-05-24 MED ORDER — AZITHROMYCIN 250 MG PO TABS
250.0000 mg | ORAL_TABLET | Freq: Every day | ORAL | Status: DC
  Administered 2020-05-25 – 2020-05-27 (×3): 250 mg via ORAL
  Filled 2020-05-24 (×3): qty 1

## 2020-05-24 MED ORDER — AZITHROMYCIN 250 MG PO TABS
500.0000 mg | ORAL_TABLET | Freq: Every day | ORAL | Status: AC
  Administered 2020-05-24: 500 mg via ORAL
  Filled 2020-05-24: qty 2

## 2020-05-24 MED ORDER — ALBUTEROL SULFATE (2.5 MG/3ML) 0.083% IN NEBU: 2.5000 mg | INHALATION_SOLUTION | RESPIRATORY_TRACT | Status: DC | PRN

## 2020-05-24 MED ORDER — IPRATROPIUM-ALBUTEROL 0.5-2.5 (3) MG/3ML IN SOLN
3.0000 mL | RESPIRATORY_TRACT | Status: DC
  Administered 2020-05-24 – 2020-05-25 (×3): 3 mL via RESPIRATORY_TRACT
  Filled 2020-05-24 (×3): qty 3

## 2020-05-24 MED ORDER — HYDROXYZINE HCL 10 MG PO TABS
10.0000 mg | ORAL_TABLET | Freq: Four times a day (QID) | ORAL | Status: DC | PRN
  Administered 2020-05-24 – 2020-05-26 (×3): 10 mg via ORAL
  Filled 2020-05-24 (×5): qty 1

## 2020-05-24 MED ORDER — IPRATROPIUM-ALBUTEROL 0.5-2.5 (3) MG/3ML IN SOLN
3.0000 mL | Freq: Once | RESPIRATORY_TRACT | Status: AC
  Administered 2020-05-24: 3 mL via RESPIRATORY_TRACT
  Filled 2020-05-24: qty 3

## 2020-05-24 MED ORDER — AMIODARONE HCL 200 MG PO TABS
200.0000 mg | ORAL_TABLET | Freq: Every day | ORAL | Status: DC
  Administered 2020-05-24 – 2020-05-27 (×4): 200 mg via ORAL
  Filled 2020-05-24 (×4): qty 1

## 2020-05-24 MED ORDER — HYDRALAZINE HCL 20 MG/ML IJ SOLN: 5.0000 mg | INTRAMUSCULAR | Status: DC | PRN

## 2020-05-24 NOTE — ED Notes (Signed)
Pt taken off O2, will continue to monitor the pt for any changes.

## 2020-05-24 NOTE — ED Triage Notes (Signed)
Pt from home via ems, reports that pt has been feeling sob x 3 days with worsening sx's this am. Pt was 80% of RA with EMS, placed on cpap, 125mg  of solumedrol, 2gm Mag, albuterol and duo neb given pta.

## 2020-05-24 NOTE — H&P (Signed)
History and Physical    Kathryn [name] QMG:[id number] DOB: 1934/01/28 DOA: 05/24/2020  Referring MD/NP/PA:   PCP: Jerrol Banana., MD   Patient coming from:  The patient is coming from home.  At baseline, pt is independent for most of ADL.        Chief Complaint: Shortness of breath  HPI: Kathryn [name] is a 87 y.o. female with medical history significant of hypertension, hyperlipidemia, diabetes mellitus, COPD, GERD, hypothyroidism, skin cancer, PVD, left bundle blockade, gastric ulcer, CHF with EF of 40-45%, former smoker, who presents with shortness of breath.  Patient states that she has shortness of breath for 3 days, which has been progressively worsening since this morning.  She has dry cough, no chest pain, fever or chills.  Patient was found to have oxygen desaturation to 80s and pt was put on BiPAP in ED which has weaned off now. Pt was given 125 mg of Solu-Medrol and 2 mg of magnesium sulfate by EMS.  Patient denies nausea, vomiting, diarrhea, abdominal pain, symptoms of UTI.  No unilateral weakness.  ED Course: pt was found to have WBC 5.4, BMP 375, troponin level 10, lactic acid 1.0, negative Covid PCR, electrolytes renal function okay, temperature normal, blood pressure 123/57, heart rate 80, RR 22, oxygen saturation 99% on 4 L oxygen.  Chest x-ray showed patchy residual opacity at the right lung base. D-dimer positive 1.01, but CT angiograms negative for PE, but it showed bilateral pleural effusion and CHF.  Patient is admitted to progressive bed as inpatient.  Review of Systems:   General: no fevers, chills, no body weight gain, has fatigue HEENT: no blurry vision, hearing changes or sore throat Respiratory: has dyspnea, coughing, no wheezing CV: no chest pain, no palpitations GI: no nausea, vomiting, abdominal pain, diarrhea, constipation GU: no dysuria, burning on urination, increased urinary frequency, hematuria  Ext: has trace leg edema Neuro: no unilateral  weakness, numbness, or tingling, no vision change or hearing loss Skin: no rash, no skin tear. MSK: No muscle spasm, no deformity, no limitation of range of movement in spin Heme: No easy bruising.  Travel history: No recent long distant travel.  Allergy:  Allergies  Allergen Reactions  . Oxycodone Other (See Comments)    Mental status change  . Prednisone     swelling, bruising.    Past Medical History:  Diagnosis Date  . Actinic keratosis   . Arthritis    spinal stenosis  . CHF (congestive heart failure) ([place])   . COPD (chronic obstructive pulmonary disease) ([place])   . Diabetes mellitus without complication ([place])   . Gastric ulcer 4/12   treated with Prolisec- states no problems now  . Hyperlipidemia   . Hypertension    PCP Dr Miguel Aschoff   [place]  . Hypothyroidism   . Left bundle branch block (LBBB)    atrial fibrillation  . Neuromuscular disorder ([place])    slight numbness right toes- comes and goes  . Peripheral vascular disease (HCC)    varicose veins left leg  . Pneumonia   . Seasonal allergies   . Shortness of breath   . Skin cancer    multiple from face  . Squamous cell carcinoma of skin 03/23/2013   L dorsal hand  . Squamous cell carcinoma of skin 02/04/2014   R forearm/in situ, L pretibial  . Squamous cell carcinoma of skin 09/24/2018   R thumb    Past Surgical History:  Procedure Laterality Date  .  BACK SURGERY     4/12  Dr Shellia Carwin- for lumbar stenosis  . BREAST CYST ASPIRATION Left 1965   negative  . BREAST SURGERY  1962   lumpectomy with biopsy   left  . CARPAL TUNNEL RELEASE     right  . COLONOSCOPY    . COLONOSCOPY WITH PROPOFOL N/A 06/30/2015   Procedure: COLONOSCOPY WITH PROPOFOL;  Surgeon: Hulen Luster, MD;  Location: [facility] ENDOSCOPY;  Service: Gastroenterology;  Laterality: N/A;  . COLONOSCOPY WITH PROPOFOL N/A 01/22/2017   Procedure: COLONOSCOPY WITH PROPOFOL;  Surgeon: Lollie Sails, MD;  Location: [facility] ENDOSCOPY;  Service:  Endoscopy;  Laterality: N/A;  . COLONOSCOPY WITH PROPOFOL N/A 01/28/2019   Procedure: COLONOSCOPY WITH PROPOFOL;  Surgeon: Toledo, Benay Pike, MD;  Location: [facility] ENDOSCOPY;  Service: Gastroenterology;  Laterality: N/A;  . ESOPHAGOGASTRODUODENOSCOPY    . ESOPHAGOGASTRODUODENOSCOPY (EGD) WITH PROPOFOL N/A 01/28/2019   Procedure: ESOPHAGOGASTRODUODENOSCOPY (EGD) WITH PROPOFOL;  Surgeon: Toledo, Benay Pike, MD;  Location: [facility] ENDOSCOPY;  Service: Gastroenterology;  Laterality: N/A;  . EYE SURGERY     cataract extraction with IOL bilaterally  . JOINT REPLACEMENT    . LUMBAR LAMINECTOMY/DECOMPRESSION MICRODISCECTOMY  05/24/2011   Procedure: LUMBAR LAMINECTOMY/DECOMPRESSION MICRODISCECTOMY 2 LEVELS;  Surgeon: Magnus Sinning, MD;  Location: [facility] ORS;  Service: Orthopedics;  Laterality: N/A;  L2-L3, L3-L4 (x-ray)  . RIGHT OOPHORECTOMY     due to STAPH INFECTION    Social History:  reports that she quit smoking about 16 years ago. Her smoking use included cigarettes. She has a 25.00 pack-year smoking history. She has never used smokeless tobacco. She reports current alcohol use. She reports that she does not use drugs.  Family History:  Family History  Problem Relation Age of Onset  . Dementia Mother   . Lung cancer Father   . Heart disease Father   . Hypertension Sister   . Breast cancer Maternal Aunt   . Arthritis Son      Prior to Admission medications   Medication Sig Start Date End Date Taking? Authorizing Provider  acetaminophen (TYLENOL) 500 MG tablet Take 1,000 mg by mouth every 6 (six) hours as needed. Pain     [provider]  albuterol (PROVENTIL) (2.5 MG/3ML) 0.083% nebulizer solution Take 3 mLs (2.5 mg total) by nebulization every 6 (six) hours as needed for wheezing or shortness of breath. 04/05/20   Jerrol Banana., MD  amiodarone (PACERONE) 200 MG tablet Take 1 tablet (200 mg) by mouth one daily as directed 05/11/20   Vickie Epley, MD  aspirin EC 81 MG tablet  Take 1 tablet (81 mg total) by mouth daily. Swallow whole. 04/18/20   Marrianne Mood D, PA-C  atorvastatin (LIPITOR) 40 MG tablet Take 1 tablet (40 mg total) by mouth daily. 04/18/20   Minna Merritts, MD  carvedilol (COREG) 6.25 MG tablet Take 1 tablet (6.25 mg total) by mouth 2 (two) times daily with a meal. 04/27/20 04/22/21  Marrianne Mood D, PA-C  furosemide (LASIX) 20 MG tablet Take 1 tablet (20 mg total) by mouth daily. Extra lasix after lunch for ankle swelling 04/18/20   Minna Merritts, MD  glucose blood (CONTOUR NEXT TEST) test strip Check sugar once daily  DX E11.9 11/16/16   Jerrol Banana., MD  hydrOXYzine (ATARAX/VISTARIL) 10 MG tablet TAKE 1 TO 2 TABLETS(10 TO 20 MG) BY MOUTH EVERY 6 HOURS AS NEEDED 04/26/20   Jerrol Banana., MD  levothyroxine (SYNTHROID) 100 MCG tablet  TAKE 1 TABLET(100 MCG) BY MOUTH DAILY BEFORE BREAKFAST 08/17/19   Jerrol Banana., MD  losartan (COZAAR) 25 MG tablet Take 1 tablet (25 mg total) by mouth every evening. 03/29/20   Fritzi Mandes, MD  metFORMIN (GLUCOPHAGE) 1000 MG tablet TAKE 1 TABLET(1000 MG) BY MOUTH DAILY WITH SUPPER 05/08/20   Jerrol Banana., MD  omeprazole (PRILOSEC) 20 MG capsule TAKE 1 CAPSULE(20 MG) BY MOUTH DAILY 04/05/20   Jerrol Banana., MD  tiotropium (SPIRIVA) 18 MCG inhalation capsule Place 1 capsule (18 mcg total) into inhaler and inhale daily as needed. 04/05/20   Jerrol Banana., MD    Physical Exam: Vitals:   05/24/20 1600 05/24/20 1630 05/24/20 1700 05/24/20 1813  BP: (!) 128/57 139/60 124/62 (!) 154/58  Pulse: 80 82 77 93  Resp: 18 (!) 36 17 (!) 22  Temp:    (!) 97.5 F (36.4 C)  TempSrc:    Oral  SpO2: 94% 97% 95% 100%  Weight:      Height:       General: Not in acute distress HEENT:       Eyes: PERRL, EOMI, no scleral icterus.       ENT: No discharge from the ears and nose, no pharynx injection, no tonsillar enlargement.        Neck: No JVD, no bruit, no mass felt. Heme: No neck  lymph node enlargement. Cardiac: S1/S2, RRR, No murmurs, No gallops or rubs. Respiratory: per EDP, pt initially had wheezing at arrival to ED, currently no wheezing or crackles on my exam. GI: Soft, nondistended, nontender, no rebound pain, no organomegaly, BS present. GU: No hematuria Ext: trace leg edema bilaterally. 1+DP/PT pulse bilaterally. Musculoskeletal: No joint deformities, No joint redness or warmth, no limitation of ROM in spin. Skin: No rashes.  Neuro: Alert, oriented X3, cranial nerves II-XII grossly intact, moves all extremities normally. Psych: Patient is not psychotic, no suicidal or hemocidal ideation.  Labs on Admission: I have personally reviewed following labs and imaging studies  CBC: Recent Labs  Lab 05/24/20 0746  WBC 5.4  NEUTROABS 2.8  HGB 10.6*  HCT 36.4  MCV 84.3  PLT 818   Basic Metabolic Panel: Recent Labs  Lab 05/24/20 0746  NA 138  K 4.5  CL 105  CO2 23  GLUCOSE 297*  BUN 28*  CREATININE 0.91  CALCIUM 9.8   GFR: Estimated Creatinine Clearance: 47.1 mL/min (by C-G formula based on SCr of 0.91 mg/dL). Liver Function Tests: Recent Labs  Lab 05/24/20 0746  AST 22  ALT 20  ALKPHOS 73  BILITOT 0.8  PROT 6.6  ALBUMIN 3.9   No results for input(s): LIPASE, AMYLASE in the last 168 hours. No results for input(s): AMMONIA in the last 168 hours. Coagulation Profile: Recent Labs  Lab 05/24/20 0746  INR 1.0   Cardiac Enzymes: No results for input(s): CKTOTAL, CKMB, CKMBINDEX, TROPONINI in the last 168 hours. BNP (last 3 results) No results for input(s): PROBNP in the last 8760 hours. HbA1C: No results for input(s): HGBA1C in the last 72 hours. CBG: Recent Labs  Lab 05/24/20 1403 05/24/20 1736  GLUCAP 250* 289*   Lipid Profile: No results for input(s): CHOL, HDL, LDLCALC, TRIG, CHOLHDL, LDLDIRECT in the last 72 hours. Thyroid Function Tests: No results for input(s): TSH, T4TOTAL, FREET4, T3FREE, THYROIDAB in the last 72  hours. Anemia Panel: No results for input(s): VITAMINB12, FOLATE, FERRITIN, TIBC, IRON, RETICCTPCT in the last 72 hours. Urine analysis:  Component Value Date/Time   COLORURINE STRAW (A) 05/24/2020 0746   APPEARANCEUR CLEAR (A) 05/24/2020 0746   APPEARANCEUR Clear 06/23/2014 1357   LABSPEC 1.016 05/24/2020 0746   LABSPEC 1.020 06/23/2014 1357   PHURINE 5.0 05/24/2020 0746   GLUCOSEU 150 (A) 05/24/2020 0746   GLUCOSEU Negative 06/23/2014 1357   HGBUR NEGATIVE 05/24/2020 0746   BILIRUBINUR NEGATIVE 05/24/2020 0746   BILIRUBINUR Negative 01/19/2020 1112   BILIRUBINUR Negative 06/23/2014 1357   KETONESUR NEGATIVE 05/24/2020 0746   PROTEINUR NEGATIVE 05/24/2020 0746   UROBILINOGEN 0.2 01/19/2020 1112   NITRITE NEGATIVE 05/24/2020 0746   LEUKOCYTESUR SMALL (A) 05/24/2020 0746   LEUKOCYTESUR 1+ 06/23/2014 1357   Sepsis Labs: @LABRCNTIP (procalcitonin:4,lacticidven:4) ) Recent Results (from the past 240 hour(s))  Resp Panel by RT-PCR (Flu A&B, Covid) Nasopharyngeal Swab     Status: None   Collection Time: 05/24/20  7:47 AM   Specimen: Nasopharyngeal Swab; Nasopharyngeal(NP) swabs in vial transport medium  Result Value Ref Range Status   SARS Coronavirus 2 by RT PCR NEGATIVE NEGATIVE Final    Comment: (NOTE) SARS-CoV-2 target nucleic acids are NOT DETECTED.  The SARS-CoV-2 RNA is generally detectable in upper respiratory specimens during the acute phase of infection. The lowest concentration of SARS-CoV-2 viral copies this assay can detect is 138 copies/mL. A negative result does not preclude SARS-Cov-2 infection and should not be used as the sole basis for treatment or other patient management decisions. A negative result may occur with  improper specimen collection/handling, submission of specimen other than nasopharyngeal swab, presence of viral mutation(s) within the areas targeted by this assay, and inadequate number of viral copies(<138 copies/mL). A negative result must  be combined with clinical observations, patient history, and epidemiological information. The expected result is Negative.  Fact Sheet for Patients:  EntrepreneurPulse.com.au  Fact Sheet for Healthcare Providers:  IncredibleEmployment.be  This test is no t yet approved or cleared by the [place] FDA and  has been authorized for detection and/or diagnosis of SARS-CoV-2 by FDA under an Emergency Use Authorization (EUA). This EUA will remain  in effect (meaning this test can be used) for the duration of the COVID-19 declaration under Section 564(b)(1) of the Act, 21 U.S.C.section 360bbb-3(b)(1), unless the authorization is terminated  or revoked sooner.       Influenza A by PCR NEGATIVE NEGATIVE Final   Influenza B by PCR NEGATIVE NEGATIVE Final    Comment: (NOTE) The Xpert Xpress SARS-CoV-2/FLU/RSV plus assay is intended as an aid in the diagnosis of influenza from Nasopharyngeal swab specimens and should not be used as a sole basis for treatment. Nasal washings and aspirates are unacceptable for Xpert Xpress SARS-CoV-2/FLU/RSV testing.  Fact Sheet for Patients: EntrepreneurPulse.com.au  Fact Sheet for Healthcare Providers: IncredibleEmployment.be  This test is not yet approved or cleared by the [place] FDA and has been authorized for detection and/or diagnosis of SARS-CoV-2 by FDA under an Emergency Use Authorization (EUA). This EUA will remain in effect (meaning this test can be used) for the duration of the COVID-19 declaration under Section 564(b)(1) of the Act, 21 U.S.C. section 360bbb-3(b)(1), unless the authorization is terminated or revoked.  Performed at [facility], [address], [place], [place] [address]      Radiological Exams on Admission: CT Angio Chest PE W and/or Wo Contrast  Result Date: 05/24/2020 CLINICAL DATA:  PE suspected, high probability. Shortness of  breath for 3 days. Progressive symptoms this morning. EXAM: CT ANGIOGRAPHY CHEST WITH CONTRAST TECHNIQUE: Multidetector CT  imaging of the chest was performed using the standard protocol during bolus administration of intravenous contrast. Multiplanar CT image reconstructions and MIPs were obtained to evaluate the vascular anatomy. CONTRAST:  68mL OMNIPAQUE IOHEXOL 350 MG/ML SOLN COMPARISON:  One-view chest x-ray 05/24/2020. CT chest with contrast 10/22/2012 FINDINGS: Cardiovascular: Heart size is normal. Atherosclerotic calcifications are present at the aortic arch great vessel origins. No aneurysm is present. Atherosclerotic calcifications are present in the coronary arteries. Pulmonary artery opacification is excellent. No focal filling defects are present to suggest pulmonary emboli. Study is mildly degraded by breathing motion. Pulmonary artery size is normal. Mediastinum/Nodes: Subcentimeter paratracheal and prevascular lymph nodes are likely reactive. Is slightly larger, but were present on the prior study. The esophagus is within normal limits. Thoracic inlet is unremarkable. Lungs/Pleura: Bilateral pleural effusions are present. Dependent airspace disease is associated. No other focal nodule, mass, or airspace disease is present. Airways are patent. Breathing motion is noted. Upper Abdomen: Visualized upper abdomen is remarkable for atherosclerotic change. No solid organ lesions are present. Musculoskeletal: Vertebral body heights and alignment are normal. No significant listhesis is present. Ribs are within normal limits bilaterally. Advanced degenerative changes are noted at both shoulders. Review of the MIP images confirms the above findings. IMPRESSION: 1. No pulmonary embolus. 2. Bilateral pleural effusions with associated dependent airspace disease. Findings are consistent with congestive heart failure. Infection is considered less likely. 3. Coronary artery disease. 4. Aortic Atherosclerosis  (ICD10-I70.0). Electronically Signed   By: San Morelle M.D.   On: 05/24/2020 11:30   DG Chest Port 1 View  Result Date: 05/24/2020 CLINICAL DATA:  Possible sepsis Shortness of breath for 3 days EXAM: PORTABLE CHEST 1 VIEW COMPARISON:  03/26/2020 FINDINGS: Heart size at upper limits of normal. Mild pulmonary vascular congestion again seen. There has been interval improvement in aeration of the lungs, however focal opacity still remains at the right lung base suspicious for pneumonia. Advanced degenerative changes of the glenohumeral joints bilaterally. IMPRESSION: Patchy residual opacity at the right lung base suspicious for pneumonia. Electronically Signed   By: Miachel Roux M.D.   On: 05/24/2020 08:20     EKG: I have personally reviewed.  Sinus rhythm, QTC 532, read axis deviation, left bundle blockage, anteroseptal infarction pattern  Assessment/Plan Principal Problem:   Acute on chronic respiratory failure with hypoxia (HCC) Active Problems:   Hyperlipidemia   Essential hypertension   Hypothyroidism   Acute on chronic systolic CHF (congestive heart failure) (HCC)   COPD exacerbation (HCC)   Diabetes mellitus without complication (HCC)   Acute on chronic respiratory failure with hypoxia (HCC) due to combination of CHF and COPD exacerbation: Patient has elevated BNP 375, CT angiogram was negative for PE, but showed CHF pattern, consistent with CHF exacerbation.  Patient had wheezing at arrival per ED physician, indicating COPD exacerbation  -Admit to progressive bed as inpatient -Bronchodilators -Solu-Medrol -IV Lasix -Nasal cannula oxygen to maintain oxygen saturation above 93% -Follow-up lower extremity Doppler to rule out DVT due to positive D-dimer  Acute on chronic systolic CHF: -Lasix 40 mg bid by IV - will not repeat 2d echo since pt had on on 03/28/20 which showed EF of 40-45% -Daily weights -strict I/O's -Low salt diet -Fluid restriction -Obtain REDs Vest  reading  COPD exacerbation (HCC) -Bronchodilators -Patient is allergic to prednisone, will not give more steroid -Z-Pak  Hyperlipidemia -Lipitor  Essential hypertension -IV hydralazine as needed -Patient is on IV Lasix -Coreg, Cozaar  Hypothyroidism -Synthroid  Diabetes mellitus without  complication [facility]): Recent A1c 6.9, well controlled.  Patient is taking Metformin at home -Sliding scale insulin      DVT ppx: SQ Lovenox Code Status: DNR per pt and her son Family Communication: Yes, patient's son by phone Desposition Plan:  Anticipate discharge back to previous environment Consults called:  nopne Admission status and Level of care: Progressive Cardiac:   as inpt      Status is: Inpatient  Remains inpatient appropriate because:Inpatient level of care appropriate due to severity of illness   Dispo: The patient is from: Home              Anticipated d/c is to: Home              Patient currently is not medically stable to d/c.   Difficult to place patient No          Date of Service 05/24/2020    [facility]   If 7PM-7AM, please contact night-coverage [email or web address] 05/24/2020, 6:22 PM

## 2020-05-24 NOTE — ED Notes (Signed)
Informed RN bed assigned [id number] 

## 2020-05-24 NOTE — ED Notes (Signed)
Spoke with lab, covid swab was sent to lab over 2hrs ago, states they have it and will collect it in results

## 2020-05-24 NOTE — Consult Note (Addendum)
   Heart Failure Nurse Navigator Note  HFmrEf 08-65% grade 2 diastolic dysfunction.  Normal global longitudinal strain.  Normal right ventricular systolic function.  Mild left atrial enlargement.  Mild mitral regurgitation.  Mildly sclerotic aortic valve.  No evidence of aortic valve stenosis.  She presented to the emergency room with complaints of shortness of breath woke her up out of a sound sleep.  She had noted ankle swelling a few days ago and had taken an extra furosemide as instructed by Dr. Rockey Situ.  She states prior to last night's episode that she had been having brief episodes where she would wake up feeling that she could not get a deep enough breath. Sit up for a brief time and then return to sleep. She denied any palpitations, chest pain or pressure.  She had last been hospitalized in January, on the 22nd with heart failure.  Comorbidities:  COPD/emphysema Hyperlipidemia Coronary artery disease Arthritis Diabetes Hypertension   By Zio patch  in January patient has diagnosed with  PSVT.  For which she was started on amiodarone.  Chronic left bundle branch block.   Previous history of tobacco abuse.  Medications:  Aspirin 81 mg daily Amiodarone 200 mg daily Atorvastatin 40 mg daily Carvedilol 6.25 mg twice a day Furosemide 20 mg 1 daily May take extra Lasix for ankle swelling. Losartan 25 mg every evening  Labs:  Sodium 138, potassium 4.5, chloride 105, CO2 23, BUN 28, creatinine 0.91, hemoglobin 10.6, hematocrit 36 4, BNP 371, troponin 31. Weight is 80.3 kg BMI 30.38 Blood pressure 123/57  Chest x-ray revealed patchy opacity on the right questioning pneumonia.  CT angio revealed bilateral pulmonary effusions.  Assessment:  Kathryn [name] is awake and alert lying in bed currently on room air in the emergency room.  HEENT-pupils are equal, unable to evaluate for JVD.  Chest-breath sounds diminished in the bases bilaterally.  Cardiac-heart tones of regular  rate and rhythm no murmur gallop appreciated.  Abdomen is rounded soft non-tender  Musculoskeletal-no pitting edema noted but ankles are puffy.  Neurologic-speech is clear, moves all extremities without difficulty.  Psych-is pleasant and appropriate, makes good eye contact.    Patient states that she has been compliant with taking her medications as ordered, and even been instructed by Dr. Rockey Situ with she noted puffy ankles to take an extra furosemide which she did a couple days ago.  For the last few nights she is noted that she will wake up feeling like she cannot get enough breath and she states usually when she sits up it will pass after a brief moment but she states last night it did not improve.   She is trying to be compliant with her sodium intake, knows that there is room for improvement but that she is working on it.  She weighs herself daily and has noticed that she will go up 1 pound and then go back down a pound.  She states that she still has her heart failure team booklet and refers to it from time to time.  Discussed being seen again in the heart failure clinic and she is in agreement.  She will be given an appointment for March 11 at 1 PM. She did not have any further questions at this time, we will continue to follow along.   Pricilla Riffle RN [facility]

## 2020-05-24 NOTE — ED Provider Notes (Signed)
 [facility] Emergency Department Provider Note    Event Date/Time   First MD Initiated Contact with Patient 05/24/20 0738     (approximate)  I have reviewed the triage vital signs and the nursing notes.   HISTORY  Chief Complaint Respiratory Distress    HPI Kathryn [name] is a 86 y.o. female the below listed past medical history presents to the ER for evaluation of respiratory distress.  Patient is feeling very short of breath earlier this morning.  Went to bed feeling normal.  Denies any chest pain.  No nausea or vomiting.  No measured fevers.  She called EMS due to shortness of breath and was found to be hypoxic in the 80s on room air.  Placed on CPAP she was given albuterol Solu-Medrol and 2 g of magnesium and she is feeling significantly better.  Arrives on CPAP still tachypneic speaking in short phrases.    Past Medical History:  Diagnosis Date  . Actinic keratosis   . Arthritis    spinal stenosis  . CHF (congestive heart failure) (HCC)   . COPD (chronic obstructive pulmonary disease) (HCC)   . Diabetes mellitus without complication (HCC)   . Gastric ulcer 4/12   treated with Prolisec- states no problems now  . Hyperlipidemia   . Hypertension    PCP Dr Richard Gilbert   [place]  . Hypothyroidism   . Left bundle branch block (LBBB)    atrial fibrillation  . Neuromuscular disorder (HCC)    slight numbness right toes- comes and goes  . Peripheral vascular disease (HCC)    varicose veins left leg  . Pneumonia   . Seasonal allergies   . Shortness of breath   . Skin cancer    multiple from face  . Squamous cell carcinoma of skin 03/23/2013   L dorsal hand  . Squamous cell carcinoma of skin 02/04/2014   R forearm/in situ, L pretibial  . Squamous cell carcinoma of skin 09/24/2018   R thumb   Family History  Problem Relation Age of Onset  . Dementia Mother   . Lung cancer Father   . Heart disease Father   . Hypertension Sister   .  Breast cancer Maternal Aunt   . Arthritis Son    Past Surgical History:  Procedure Laterality Date  . BACK SURGERY     4/12  Dr Aplington- for lumbar stenosis  . BREAST CYST ASPIRATION Left 1965   negative  . BREAST SURGERY  1962   lumpectomy with biopsy   left  . CARPAL TUNNEL RELEASE     right  . COLONOSCOPY    . COLONOSCOPY WITH PROPOFOL N/A 06/30/2015   Procedure: COLONOSCOPY WITH PROPOFOL;  Surgeon: Paul Y Oh, MD;  Location: [facility] ENDOSCOPY;  Service: Gastroenterology;  Laterality: N/A;  . COLONOSCOPY WITH PROPOFOL N/A 01/22/2017   Procedure: COLONOSCOPY WITH PROPOFOL;  Surgeon: Skulskie, Martin U, MD;  Location: [facility] ENDOSCOPY;  Service: Endoscopy;  Laterality: N/A;  . COLONOSCOPY WITH PROPOFOL N/A 01/28/2019   Procedure: COLONOSCOPY WITH PROPOFOL;  Surgeon: Toledo, Teodoro K, MD;  Location: [facility] ENDOSCOPY;  Service: Gastroenterology;  Laterality: N/A;  . ESOPHAGOGASTRODUODENOSCOPY    . ESOPHAGOGASTRODUODENOSCOPY (EGD) WITH PROPOFOL N/A 01/28/2019   Procedure: ESOPHAGOGASTRODUODENOSCOPY (EGD) WITH PROPOFOL;  Surgeon: Toledo, Teodoro K, MD;  Location: [facility] ENDOSCOPY;  Service: Gastroenterology;  Laterality: N/A;  . EYE SURGERY     cataract extraction with IOL bilaterally  . JOINT REPLACEMENT    . LUMBAR LAMINECTOMY/DECOMPRESSION MICRODISCECTOMY    05/24/2011   Procedure: LUMBAR LAMINECTOMY/DECOMPRESSION MICRODISCECTOMY 2 LEVELS;  Surgeon: James P Aplington, MD;  Location: [facility] ORS;  Service: Orthopedics;  Laterality: N/A;  L2-L3, L3-L4 (x-ray)  . RIGHT OOPHORECTOMY     due to STAPH INFECTION   Patient Active Problem List   Diagnosis Date Noted  . Acute CHF (HCC) 03/27/2020  . Acute CHF (congestive heart failure) (HCC) 03/27/2020  . Acute respiratory failure with hypoxia (HCC)   . Diabetes mellitus type 2, uncomplicated (HCC) 08/09/2016  . COPD, mild (HCC) 09/17/2014  . Clinical depression 09/17/2014  . Elevated platelet count 09/17/2014  . Elevated erythrocyte sedimentation rate  09/17/2014  . History of tobacco use 09/17/2014  . Hypercholesteremia 09/17/2014  . BP (high blood pressure) 09/17/2014  . Hypothyroidism 09/17/2014  . Cannot sleep 09/17/2014  . Lumbar disc disease with radiculopathy 09/17/2014  . Arthritis, degenerative 09/17/2014  . Excess weight 09/17/2014  . Vertebrobasilar circulation transient ischemic attack 09/17/2014  . Lung nodule, multiple 09/17/2014  . Basilar artery insufficiency 09/17/2014  . Neuritis or radiculitis due to rupture of lumbar intervertebral disc 08/25/2013  . Degenerative arthritis of lumbar spine 08/25/2013  . Lumbar canal stenosis 08/25/2013  . Smoker 07/15/2012  . Back pain 07/15/2012  . SOB (shortness of breath) 07/15/2012  . Hyperlipidemia 07/15/2012  . Essential hypertension 07/15/2012      Prior to Admission medications   Medication Sig Start Date End Date Taking? Authorizing Provider  acetaminophen (TYLENOL) 500 MG tablet Take 1,000 mg by mouth every 6 (six) hours as needed. Pain     [provider]  albuterol (PROVENTIL) (2.5 MG/3ML) 0.083% nebulizer solution Take 3 mLs (2.5 mg total) by nebulization every 6 (six) hours as needed for wheezing or shortness of breath. 04/05/20   Gilbert, Richard L Jr., MD  amiodarone (PACERONE) 200 MG tablet Take 1 tablet (200 mg) by mouth one daily as directed 05/11/20   Lambert, Cameron T, MD  aspirin EC 81 MG tablet Take 1 tablet (81 mg total) by mouth daily. Swallow whole. 04/18/20   Visser, Jacquelyn D, PA-C  atorvastatin (LIPITOR) 40 MG tablet Take 1 tablet (40 mg total) by mouth daily. 04/18/20   Gollan, Timothy J, MD  carvedilol (COREG) 6.25 MG tablet Take 1 tablet (6.25 mg total) by mouth 2 (two) times daily with a meal. 04/27/20 04/22/21  Visser, Jacquelyn D, PA-C  furosemide (LASIX) 20 MG tablet Take 1 tablet (20 mg total) by mouth daily. Extra lasix after lunch for ankle swelling 04/18/20   Gollan, Timothy J, MD  glucose blood (CONTOUR NEXT TEST) test strip Check  sugar once daily  DX E11.9 11/16/16   Gilbert, Richard L Jr., MD  hydrOXYzine (ATARAX/VISTARIL) 10 MG tablet TAKE 1 TO 2 TABLETS(10 TO 20 MG) BY MOUTH EVERY 6 HOURS AS NEEDED 04/26/20   Gilbert, Richard L Jr., MD  levothyroxine (SYNTHROID) 100 MCG tablet TAKE 1 TABLET(100 MCG) BY MOUTH DAILY BEFORE BREAKFAST 08/17/19   Gilbert, Richard L Jr., MD  losartan (COZAAR) 25 MG tablet Take 1 tablet (25 mg total) by mouth every evening. 03/29/20   Patel, Sona, MD  metFORMIN (GLUCOPHAGE) 1000 MG tablet TAKE 1 TABLET(1000 MG) BY MOUTH DAILY WITH SUPPER 05/08/20   Gilbert, Richard L Jr., MD  omeprazole (PRILOSEC) 20 MG capsule TAKE 1 CAPSULE(20 MG) BY MOUTH DAILY 04/05/20   Gilbert, Richard L Jr., MD  tiotropium (SPIRIVA) 18 MCG inhalation capsule Place 1 capsule (18 mcg total) into inhaler and inhale daily as needed. 04/05/20     Gilbert, Richard L Jr., MD    Allergies Oxycodone and Prednisone    Social History Social History   Tobacco Use  . Smoking status: Former Smoker    Packs/day: 0.50    Years: 50.00    Pack years: 25.00    Types: Cigarettes    Quit date: 05/16/2004    Years since quitting: 16.0  . Smokeless tobacco: Never Used  Vaping Use  . Vaping Use: Never used  Substance Use Topics  . Alcohol use: Yes    Comment: socially-1 glass of wine or [place] icea tea once a month  . Drug use: No    Review of Systems Patient denies headaches, rhinorrhea, blurry vision, numbness, shortness of breath, chest pain, edema, cough, abdominal pain, nausea, vomiting, diarrhea, dysuria, fevers, rashes or hallucinations unless otherwise stated above in HPI. ____________________________________________   PHYSICAL EXAM:  VITAL SIGNS: Vitals:   05/24/20 1000 05/24/20 1030  BP: 122/69 123/64  Pulse: 77 72  Resp: 17 16  Temp:    SpO2: 100% 99%    Constitutional: Alert and oriented. Moderate respiratory distress  Eyes: Conjunctivae are normal.  Head: Atraumatic. Nose: No  congestion/rhinnorhea. Mouth/Throat: Mucous membranes are moist.   Neck: No stridor. Painless ROM.  Cardiovascular: Normal rate, regular rhythm. Grossly normal heart sounds.  Good peripheral circulation. Respiratory: tachypnea with diminished BS throughout Gastrointestinal: Soft and nontender. No distention. No abdominal bruits. No CVA tenderness. Genitourinary:  Musculoskeletal: No lower extremity tenderness trace bilat LE  No joint effusions. Neurologic:  Normal speech and language. No gross focal neurologic deficits are appreciated. No facial droop Skin:  Skin is warm, dry and intact. No rash noted. Psychiatric: Mood and affect are normal. Speech and behavior are normal.  ____________________________________________   LABS (all labs ordered are listed, but only abnormal results are displayed)  Results for orders placed or performed during the hospital encounter of 05/24/20 (from the past 24 hour(s))  Comprehensive metabolic panel     Status: Abnormal   Collection Time: 05/24/20  7:46 AM  Result Value Ref Range   Sodium 138 135 - 145 mmol/L   Potassium 4.5 3.5 - 5.1 mmol/L   Chloride 105 98 - 111 mmol/L   CO2 23 22 - 32 mmol/L   Glucose, Bld 297 (H) 70 - 99 mg/dL   BUN 28 (H) 8 - 23 mg/dL   Creatinine, Ser 0.91 0.44 - 1.00 mg/dL   Calcium 9.8 8.9 - 10.3 mg/dL   Total Protein 6.6 6.5 - 8.1 g/dL   Albumin 3.9 3.5 - 5.0 g/dL   AST 22 15 - 41 U/L   ALT 20 0 - 44 U/L   Alkaline Phosphatase 73 38 - 126 U/L   Total Bilirubin 0.8 0.3 - 1.2 mg/dL   GFR, Estimated >60 >60 mL/min   Anion gap 10 5 - 15  CBC WITH DIFFERENTIAL     Status: Abnormal   Collection Time: 05/24/20  7:46 AM  Result Value Ref Range   WBC 5.4 4.0 - 10.5 K/uL   RBC 4.32 3.87 - 5.11 MIL/uL   Hemoglobin 10.6 (L) 12.0 - 15.0 g/dL   HCT 36.4 36.0 - 46.0 %   MCV 84.3 80.0 - 100.0 fL   MCH 24.5 (L) 26.0 - 34.0 pg   MCHC 29.1 (L) 30.0 - 36.0 g/dL   RDW 15.3 11.5 - 15.5 %   Platelets 249 150 - 400 K/uL   nRBC 0.0  0.0 - 0.2 %   Neutrophils Relative %   51 %   Neutro Abs 2.8 1.7 - 7.7 K/uL   Lymphocytes Relative 32 %   Lymphs Abs 1.8 0.7 - 4.0 K/uL   Monocytes Relative 11 %   Monocytes Absolute 0.6 0.1 - 1.0 K/uL   Eosinophils Relative 4 %   Eosinophils Absolute 0.2 0.0 - 0.5 K/uL   Basophils Relative 1 %   Basophils Absolute 0.0 0.0 - 0.1 K/uL   Immature Granulocytes 1 %   Abs Immature Granulocytes 0.04 0.00 - 0.07 K/uL  Protime-INR     Status: None   Collection Time: 05/24/20  7:46 AM  Result Value Ref Range   Prothrombin Time 12.9 11.4 - 15.2 seconds   INR 1.0 0.8 - 1.2  APTT     Status: None   Collection Time: 05/24/20  7:46 AM  Result Value Ref Range   aPTT 32 24 - 36 seconds  Troponin I (High Sensitivity)     Status: None   Collection Time: 05/24/20  7:46 AM  Result Value Ref Range   Troponin I (High Sensitivity) 10 <18 ng/L  Brain natriuretic peptide     Status: Abnormal   Collection Time: 05/24/20  7:47 AM  Result Value Ref Range   B Natriuretic Peptide 371.5 (H) 0.0 - 100.0 pg/mL  Blood gas, venous     Status: Abnormal   Collection Time: 05/24/20  7:47 AM  Result Value Ref Range   pH, Ven 7.31 7.250 - 7.430   pCO2, Ven 57 44.0 - 60.0 mmHg   pO2, Ven 35.0 32.0 - 45.0 mmHg   Bicarbonate 28.7 (H) 20.0 - 28.0 mmol/L   Acid-Base Excess 1.5 0.0 - 2.0 mmol/L   O2 Saturation 60.7 %   Patient temperature 37.0    Sample type VENOUS   Resp Panel by RT-PCR (Flu A&B, Covid) Nasopharyngeal Swab     Status: None   Collection Time: 05/24/20  7:47 AM   Specimen: Nasopharyngeal Swab; Nasopharyngeal(NP) swabs in vial transport medium  Result Value Ref Range   SARS Coronavirus 2 by RT PCR NEGATIVE NEGATIVE   Influenza A by PCR NEGATIVE NEGATIVE   Influenza B by PCR NEGATIVE NEGATIVE  POC SARS Coronavirus 2 Ag-ED - Nasal Swab (BD Veritor Kit)     Status: None   Collection Time: 05/24/20 10:30 AM  Result Value Ref Range   SARS Coronavirus 2 Ag NEGATIVE NEGATIVE    ____________________________________________  EKG My review and personal interpretation at Time: 7:42   Indication: sob  Rate: 80  Rhythm: sinus Axis: normal Other: lbbb, no stemi, abnml  ____________________________________________  RADIOLOGY  I personally reviewed all radiographic images ordered to evaluate for the above acute complaints and reviewed radiology reports and findings.  These findings were personally discussed with the patient.  Please see medical record for radiology report.  ____________________________________________   PROCEDURES  Procedure(s) performed:  .Critical Care Performed by: Merlyn Lot, MD Authorized by: Merlyn Lot, MD   Critical care provider statement:    Critical care time (minutes):  35   Critical care time was exclusive of:  Separately billable procedures and treating other patients   Critical care was necessary to treat or prevent imminent or life-threatening deterioration of the following conditions:  Respiratory failure   Critical care was time spent personally by me on the following activities:  Development of treatment plan with patient or surrogate, discussions with consultants, evaluation of patient's response to treatment, examination of patient, obtaining history from patient or surrogate, ordering and performing treatments  and interventions, ordering and review of laboratory studies, ordering and review of radiographic studies, pulse oximetry, re-evaluation of patient's condition and review of old charts      Critical Care performed: yes ____________________________________________   INITIAL IMPRESSION / ASSESSMENT AND PLAN / ED COURSE  Pertinent labs & imaging results that were available during my care of the patient were reviewed by me and considered in my medical decision making (see chart for details).   DDX: Asthma, copd, CHF, pna, ptx, malignancy, Pe, anemia   Krystian L Word is a 86 y.o. who presents to the  ED with symptoms as described above.  Patient arrives in moderate respiratory failure requiring CPAP.  Hypoxic on room air.  Will transition to BiPAP.  Seems like she got some improvement after treatment for COPD but also has a history of CHF and does have some mild trace but lateral lower extremity edema.  Blood work we sent for by differential.  She not complaining any chest pain or pressure at this time.  Lower suspicion for ACS.  The patient will be placed on continuous pulse oximetry and telemetry for monitoring.  Laboratory evaluation will be sent to evaluate for the above complaints.     Clinical Course as of 05/24/20 1145  Tue May 24, 2020  0906 Patient feels significantly better I suspect this is more likely secondary to some mild CHF as well as COPD.  We will try her off BiPAP she is still on 3 L nasal cannula. [PR]  0919 Patient is more comfortable now on nasal cannula.  I am going to get CTA of her chest due to concern for possible PE as BNP only mildly elevated.  This may be purely underlying COPD but has less wheezing than I would expect.  Given the lack of fever tachycardia or white count I have a lower suspicion for pneumonia. [PR]  1134 Covid negative.  CTA shows no evidence of blood clot.  Does have some mild CHF will give Lasix.  I suspect the majority of her symptoms are secondary to COPD exacerbation given her hypoxia respiratory distress upon arrival will discuss with hospitalist for further management. [PR]    Clinical Course User Index [PR] Robinson, Patrick, MD    The patient was evaluated in Emergency Department today for the symptoms described in the history of present illness. He/she was evaluated in the context of the global COVID-19 pandemic, which necessitated consideration that the patient might be at risk for infection with the SARS-CoV-2 virus that causes COVID-19. Institutional protocols and algorithms that pertain to the evaluation of patients at risk for COVID-19 are  in a state of rapid change based on information released by regulatory bodies including the CDC and federal and state organizations. These policies and algorithms were followed during the patient's care in the ED.  As part of my medical decision making, I reviewed the following data within the electronic medical record:  Nursing notes reviewed and incorporated, Labs reviewed, notes from prior ED visits and [place] Controlled Substance Database   ____________________________________________   FINAL CLINICAL IMPRESSION(S) / ED DIAGNOSES  Final diagnoses:  Acute respiratory failure with hypoxia (HCC)  Chronic obstructive pulmonary disease with acute exacerbation (HCC)  Congestive heart failure, unspecified HF chronicity, unspecified heart failure type (HCC)      NEW MEDICATIONS STARTED DURING THIS VISIT:  New Prescriptions   No medications on file     Note:  This document was prepared using Dragon voice recognition software and may include   unintentional dictation errors.    Merlyn Lot, MD 05/24/20 1145

## 2020-05-25 ENCOUNTER — Encounter: Payer: Self-pay | Admitting: Internal Medicine

## 2020-05-25 ENCOUNTER — Inpatient Hospital Stay: Payer: Medicare Other

## 2020-05-25 DIAGNOSIS — J9621 Acute and chronic respiratory failure with hypoxia: Secondary | ICD-10-CM

## 2020-05-25 DIAGNOSIS — I5043 Acute on chronic combined systolic (congestive) and diastolic (congestive) heart failure: Secondary | ICD-10-CM

## 2020-05-25 DIAGNOSIS — I700 Atherosclerosis of aorta: Secondary | ICD-10-CM

## 2020-05-25 DIAGNOSIS — J441 Chronic obstructive pulmonary disease with (acute) exacerbation: Secondary | ICD-10-CM

## 2020-05-25 DIAGNOSIS — E119 Type 2 diabetes mellitus without complications: Secondary | ICD-10-CM

## 2020-05-25 HISTORY — DX: Atherosclerosis of aorta: I70.0

## 2020-05-25 LAB — BLOOD CULTURE ID PANEL (REFLEXED) - BCID2

## 2020-05-25 LAB — BASIC METABOLIC PANEL
Anion gap: 11 (ref 5–15)
BUN: 31 mg/dL — ABNORMAL HIGH (ref 8–23)
CO2: 27 mmol/L (ref 22–32)
Calcium: 10.1 mg/dL (ref 8.9–10.3)
Chloride: 101 mmol/L (ref 98–111)
Creatinine, Ser: 0.91 mg/dL (ref 0.44–1.00)
GFR, Estimated: >60 mL/min (ref >60)
Glucose, Bld: 190 mg/dL — ABNORMAL HIGH (ref 70–99)
Potassium: 4.7 mmol/L (ref 3.5–5.1)
Sodium: 139 mmol/L (ref 135–145)

## 2020-05-25 LAB — CBC
HCT: 37.6 % (ref 36.0–46.0)
Hemoglobin: 11.6 g/dL — ABNORMAL LOW (ref 12.0–15.0)
MCH: 25.2 pg — ABNORMAL LOW (ref 26.0–34.0)
MCHC: 30.9 g/dL (ref 30.0–36.0)
MCV: 81.6 fL (ref 80.0–100.0)
Platelets: 305 10*3/uL (ref 150–400)
RBC: 4.61 MIL/uL (ref 3.87–5.11)
RDW: 15.6 % — ABNORMAL HIGH (ref 11.5–15.5)
WBC: 13.2 10*3/uL — ABNORMAL HIGH (ref 4.0–10.5)
nRBC: 0 % (ref 0.0–0.2)

## 2020-05-25 LAB — MAGNESIUM: Magnesium: 2.1 mg/dL (ref 1.7–2.4)

## 2020-05-25 LAB — GLUCOSE, CAPILLARY
Glucose-Capillary: 184 mg/dL — ABNORMAL HIGH (ref 70–99)
Glucose-Capillary: 167 mg/dL — ABNORMAL HIGH (ref 70–99)
Glucose-Capillary: 105 mg/dL — ABNORMAL HIGH (ref 70–99)
Glucose-Capillary: 157 mg/dL — ABNORMAL HIGH (ref 70–99)

## 2020-05-25 MED ORDER — VANCOMYCIN HCL 1250 MG/250ML IV SOLN
1250.0000 mg | INTRAVENOUS | Status: DC
  Filled 2020-05-25: qty 250

## 2020-05-25 MED ORDER — VANCOMYCIN HCL 1500 MG/300ML IV SOLN
1500.0000 mg | Freq: Once | INTRAVENOUS | Status: AC
  Administered 2020-05-25: 1500 mg via INTRAVENOUS
  Filled 2020-05-25: qty 300

## 2020-05-25 MED ORDER — IPRATROPIUM-ALBUTEROL 0.5-2.5 (3) MG/3ML IN SOLN
3.0000 mL | Freq: Two times a day (BID) | RESPIRATORY_TRACT | Status: DC
  Administered 2020-05-25 – 2020-05-26 (×2): 3 mL via RESPIRATORY_TRACT
  Filled 2020-05-25 (×2): qty 3

## 2020-05-25 MED ORDER — INSULIN ASPART 100 UNIT/ML ~~LOC~~ SOLN
2.0000 [IU] | Freq: Three times a day (TID) | SUBCUTANEOUS | Status: DC
  Administered 2020-05-25 – 2020-05-27 (×7): 2 [IU] via SUBCUTANEOUS
  Filled 2020-05-25 (×7): qty 1

## 2020-05-25 MED ORDER — TRAZODONE HCL 50 MG PO TABS
25.0000 mg | ORAL_TABLET | Freq: Every evening | ORAL | Status: DC | PRN
  Administered 2020-05-25: 25 mg via ORAL
  Filled 2020-05-25: qty 1

## 2020-05-25 NOTE — Progress Notes (Signed)
PROGRESS NOTE  Kathryn [name] FTD:[id number] DOB: November 23, 1933 DOA: 05/24/2020 PCP: Jerrol Banana., MD  Brief History   87 year old woman PMH combined systolic diastolic CHF presented w/ resp distress requiring BiPAP in ED. Admitted for acute hypoxia, CHF, COPD exacerbations.   A & P  Acute hypoxic resp failure secondary to acute on  chronic combined systolic/diastolic CHF LVEF 06-23%, grade 2 diastolic dysfunction  --hypoxia resolved w/ treatment, breathing better but still has rales on exam --continue IV diuresis --continue to treat COPD  Acute on chronic systolic/diastolic CHF --still has rales; will continue Coreg, Losartan, Lasix --has appt w/ heart failure clinic 3/11 at 1 pm.  Acute COPD exacerbation. No wheezes on exam and not on steroids --improving, no wheezes, fair air movement, will not start steroid at this point, will continue nebs  DM type 2 --CBG labile, continue SSI, add meal coverage  GERD --protonix  Aortic atherosclerosis --atorvastatin   Disposition Plan:  Discussion: as above improved, will downgrade to cardiac med surg. UOP 1900, down 1.4L since admission, weight down 1.5 lb  Status is: Inpatient  Remains inpatient appropriate because:IV treatments appropriate due to intensity of illness or inability to take PO and Inpatient level of care appropriate due to severity of illness   Dispo: The patient is from: Home              Anticipated d/c is to: Home              Patient currently is not medically stable to d/c.   Difficult to place patient No  DVT prophylaxis: enoxaparin (LOVENOX) injection 40 mg Start: 05/24/20 2200   Code Status: DNR Level of care: Progressive Cardiac Family Communication: none  Murray Hodgkins, MD  [facility] Direct contact: see [email or web address] (further directions at bottom of note if needed) 7PM-7AM contact night coverage as at bottom of note 05/25/2020, 11:44 AM  LOS: 1 day   Significant Hospital Events   .     Consults:  .    Procedures:  .   Significant Diagnostic Tests:  Marland Kitchen    Micro Data:  .    Antimicrobials:  .   Interval History/Subjective  CC: f/u sob  Feels better but still SOB and has some lung congestion. No pain.   Objective   Vitals:  Vitals:   05/25/20 0346 05/25/20 0745  BP: 135/64 125/77  Pulse: 81 83  Resp: 20 20  Temp: (!) 97.4 F (36.3 C) 97.7 F (36.5 C)  SpO2: 100% 99%    Exam:  Constitutional:   . Appears calm and comfortable, nontoxic ENMT:  . grossly normal hearing  Respiratory:  . Clear anteriorly, bilateral crackles posteriorily . Respiratory effort mildly increased Cardiovascular:  . RRR, no m/r/g . telemetry SR . No LE extremity edema   Psychiatric:  . Mental status o Mood, affect appropriate  I have personally reviewed the following:   Today's Data  . CBG variable . BMP noted . Hgb stable, WBC up 13.2, unclear significance  Scheduled Meds: . amiodarone  200 mg Oral Daily  . aspirin EC  81 mg Oral Daily  . atorvastatin  40 mg Oral Daily  . azithromycin  250 mg Oral Daily  . carvedilol  6.25 mg Oral BID WC  . enoxaparin (LOVENOX) injection  40 mg Subcutaneous Q24H  . furosemide  40 mg Intravenous Q12H  . insulin aspart  0-5 Units Subcutaneous QHS  . insulin aspart  0-9 Units Subcutaneous TID WC  .  insulin aspart  2 Units Subcutaneous TID WC  . ipratropium-albuterol  3 mL Nebulization BID  . levothyroxine  100 mcg Oral Q0600  . losartan  25 mg Oral QPM  . pantoprazole  40 mg Oral Daily  . sodium chloride flush  3 mL Intravenous Q12H   Continuous Infusions: . sodium chloride      Principal Problem:   Acute on chronic respiratory failure with hypoxia (HCC) Active Problems:   Hyperlipidemia   Essential hypertension   Hypothyroidism   Acute on chronic combined systolic (congestive) and diastolic (congestive) heart failure (HCC)   COPD exacerbation (HCC)   Diabetes mellitus without complication ([place])   Aortic  atherosclerosis ([place])   LOS: 1 day   How to contact the [facility] Attending or Consulting provider 7A - 7P or covering provider during after hours [place], for this patient?  1. Check the care team in [facility] and look for a) attending/consulting [facility] provider listed and b) the [facility] team listed 2. Log into [email or web address] and use [place]'s universal password to access. If you do not have the password, please contact the hospital operator. 3. Locate the [facility] provider you are looking for under [facility] and page to a number that you can be directly reached. 4. If you still have difficulty reaching the provider, please page the [facility] (Director on Call) for the Hospitalists listed on amion for assistance.

## 2020-05-25 NOTE — Progress Notes (Signed)
Heart Failure Nurse Navigator Note  HFmrEF 40-45%. Grade 2 diastolic dysfunction. Normal global longitudinal strain. Normal right ventricular systolic function. Mild left atrial enlargement. Mild mitral regurgitation. Mildly sclerotic aortic valve. No evidence of aortic stenosis.  She presented to the emergency room with complaints of shortness of breath that woke her out of a sound sleep. She had noted ankle swelling a few days ago and taken an extra furosemide as instructed by Dr. Rockey Situ. She states prior to last night's episode she been having brief episodes where she would wake up feeling that she could not get a deep enough breath. Had to sit up for a brief time and then would return to sleep. She denied any palpitations, chest pressure or pain. She was last hospitalized January 22 with heart failure.  Comorbidities:  COPD/emphysema Hyperlipidemia Coronary artery disease Arthritis Diabetes Hypertension   By Zio patch worn in January she was diagnosed with PSVT. Been started on amiodarone.  Chronic left bundle branch block Has history of tobacco abuse.  Medications:  Amiodarone 200 mg daily Aspirin 81 mg daily Atorvastatin 40 mg daily Carvedilol 6.25 mg 2 times a day Furosemide 40 mg every 12 hours IV Losartan 25 mg every evening  Labs:  Sodium 139, potassium 4.7, chloride 101, CO2 27, BUN 31 up from 28, creatinine 0.92, hemoglobin 11.6, hematocrit 37.6, white blood count 13.2 up from 5.4 of yesterday. Intake 480 mL Output 1900 mL Weight 79.6 kg down from 80.1 of yesterday.  Blood pressure 125/77 with pulse rate running 75-80. Ultrasound for DVT was negative.   Assessment:  Kathryn [name] is awake and alert lying in bed, dates that she did not sleep well last night but is not in any acute distress today.  HEENT-pupils are equal, normocephalic unable to evaluate for JVD due to body habitus.  Cardiac-heart tones are regular rate and rhythm.  Chest-breath sounds  diminished in the left base.  Abdomen-soft non tender  Musculoskeletal there is no lower extremity edema noted.  Psych-is pleasant and appropriate makes good eye contact.  Neurologic-speech is clear moves all extremities without difficulty.    Met with patient today, feels that her breathing treatments are making her more jittery. She denies any increasing shortness of breath or chest discomfort.   Discussed diet she realizes that there is room for improvement, especially with not wanting to be rehospitalized. She does weigh herself daily and knows to report 2 to 3 pounds overnight or 5 pounds within a week.  She was given an appointment to follow-up at the heart failure clinic on March 11.   Kathryn Riffle RN, [facility]

## 2020-05-25 NOTE — Progress Notes (Signed)
Inpatient Diabetes Program Recommendations  AACE/ADA: New Consensus Statement on Inpatient Glycemic Control   Target Ranges:  Prepandial:   less than 140 mg/dL      Peak postprandial:   less than 180 mg/dL (1-2 hours)      Critically ill patients:  140 - 180 mg/dL   Results for LEATHIE, WEICH (MRN [id number]) as of 05/25/2020 08:25  Ref. Range 05/24/2020 14:03 05/24/2020 17:36 05/24/2020 19:56 05/25/2020 07:49  Glucose-Capillary Latest Ref Range: 70 - 99 mg/dL 250 (H) 289 (H) 339 (H) 184 (H)   Review of Glycemic Control  Diabetes history: DM2 Outpatient Diabetes medications: Metformin 1000 mg BID Current orders for Inpatient glycemic control: Novolog 0-9 units TID with meals, Novolog 0-5 units QHS  Inpatient Diabetes Program Recommendations:    Insulin: Please consider ordering Novolog 2 units TID with meals for meal coverage if patient eats at least 50% of meals.  Thanks, Barnie Alderman, RN, MSN, CDE Diabetes Coordinator Inpatient Diabetes Program [phone] (Team Pager from 8am to 5pm)

## 2020-05-25 NOTE — Hospital Course (Addendum)
87 year old woman PMH combined systolic diastolic CHF presented w/ resp distress requiring BiPAP in ED. Admitted for acute hypoxia, CHF, COPD exacerbations. Improving slowly, COPD resolved, CHF stabilizing. Has bacteremia, may be spurious.  A & P  Acute hypoxic resp failure secondary to acute on  chronic combined systolic/diastolic CHF LVEF 50-53%, grade 2 diastolic dysfunction  --Hypoxia resolved  Acute on chronic systolic/diastolic CHF --Some crackles posteriorly but with elevated creatinine we will hold dose today as patient is asymptomatic.  Reds vest check.  Reevaluate in a.m. --has appt w/ heart failure clinic 3/11 at 1 pm.  Bacteremia --From 1 bottle only, only 1 bottle collected initially.  Repeat blood cultures pending.  Identification consistent with contaminant which fits clinical picture. --Stop vancomycin.  Acute kidney injury --Suspect secondary to overdiuresis, doubt from vancomycin.  Nevertheless stop Lasix and vancomycin. --BMP in a.m.  Acute COPD exacerbation. No wheezes on exam and not on steroids --appears resolved.  DM type 2 --CBG stable.  Continue sliding scale insulin and meal coverage.  GERD --protonix  Aortic atherosclerosis --atorvastatin

## 2020-05-25 NOTE — Consult Note (Signed)
Pharmacy Antibiotic Note  Kathryn [name] is a 87 y.o. female admitted on 05/24/2020 with bacteremia.  Pharmacy has been consulted for Vancomycin dosing.  Plan:  BCID positive for Staph Spp (NOT aureus, lugdunensis, or epidermidis) and no resistance genes noted in single set 1 of 2 bottles blood cultures. Currently only on Azithromycin for treatment of COPD exac. Pt is afeb, new leukocytosis (5.4>13.2 - w/o steroids), VSS.   Since Bcx not drawn in 2 sets, more difficult to assess if contaminant. With concern for infection will redraw repeat set of blood cultures & start Vancomycin until speciation and full culture result.  Vancomycin 1.5g x1 now; then 1.25g q24h starting 12hrs after.  Predicted AUC: 502.7; Cmax: 33.7; Cmin 12.7  (using Scr 0.91, AdjBW Ke, TBW Vd 0.72 L/kg)  Height: 5\' 4"  (162.6 cm) Weight: 79.6 kg (175 lb 8 oz) IBW/kg (Calculated) : 54.7  Temp (24hrs), Avg:97.6 F (36.4 C), Min:97.4 F (36.3 C), Max:97.7 F (36.5 C)  Recent Labs  Lab 05/24/20 0746 05/25/20 0617  WBC 5.4 13.2*  CREATININE 0.91 0.91    Estimated Creatinine Clearance: 47 mL/min (by C-G formula based on SCr of 0.91 mg/dL).    Allergies  Allergen Reactions  . Oxycodone Other (See Comments)    Mental status change  . Prednisone     swelling, bruising.    Antimicrobials this admission: Vancomycin (3/2 >>  Azithromycin (3/1 >> 3/6)  Microbiology results: 3/2 Bcx: 2 sets - sent/pending 3/1 BCx: single set - 1 of 2 Staph Spp (NOT aureus, lugdunensis, or epidermidis) and no resistance genes 3/1 UCx: sent/pending    Thank you for allowing pharmacy to be a part of this patient's care.  Shanon Brow Carnelia Oscar 05/25/2020 5:59 PM

## 2020-05-26 DIAGNOSIS — N179 Acute kidney failure, unspecified: Secondary | ICD-10-CM

## 2020-05-26 LAB — BASIC METABOLIC PANEL
Anion gap: 11 (ref 5–15)
BUN: 49 mg/dL — ABNORMAL HIGH (ref 8–23)
CO2: 24 mmol/L (ref 22–32)
Calcium: 8.9 mg/dL (ref 8.9–10.3)
Chloride: 101 mmol/L (ref 98–111)
Creatinine, Ser: 1.59 mg/dL — ABNORMAL HIGH (ref 0.44–1.00)
GFR, Estimated: 32 mL/min — ABNORMAL LOW (ref >60)
Glucose, Bld: 169 mg/dL — ABNORMAL HIGH (ref 70–99)
Potassium: 4 mmol/L (ref 3.5–5.1)
Sodium: 136 mmol/L (ref 135–145)

## 2020-05-26 LAB — URINE CULTURE

## 2020-05-26 LAB — GLUCOSE, CAPILLARY
Glucose-Capillary: 154 mg/dL — ABNORMAL HIGH (ref 70–99)
Glucose-Capillary: 204 mg/dL — ABNORMAL HIGH (ref 70–99)
Glucose-Capillary: 106 mg/dL — ABNORMAL HIGH (ref 70–99)
Glucose-Capillary: 178 mg/dL — ABNORMAL HIGH (ref 70–99)

## 2020-05-26 MED ORDER — HYDROCOD POLST-CPM POLST ER 10-8 MG/5ML PO SUER
5.0000 mL | Freq: Two times a day (BID) | ORAL | Status: DC | PRN
  Administered 2020-05-26: 5 mL via ORAL
  Filled 2020-05-26: qty 5

## 2020-05-26 MED ORDER — ENOXAPARIN SODIUM 30 MG/0.3ML ~~LOC~~ SOLN
30.0000 mg | SUBCUTANEOUS | Status: DC
  Administered 2020-05-26: 30 mg via SUBCUTANEOUS
  Filled 2020-05-26: qty 0.3

## 2020-05-26 MED ORDER — IPRATROPIUM-ALBUTEROL 0.5-2.5 (3) MG/3ML IN SOLN
3.0000 mL | Freq: Every day | RESPIRATORY_TRACT | Status: DC
  Administered 2020-05-27: 3 mL via RESPIRATORY_TRACT
  Filled 2020-05-26: qty 3

## 2020-05-26 MED ORDER — TIOTROPIUM BROMIDE MONOHYDRATE 18 MCG IN CAPS: 18.0000 ug | ORAL_CAPSULE | Freq: Every day | RESPIRATORY_TRACT | Status: DC

## 2020-05-26 NOTE — Progress Notes (Signed)
PHARMACIST - PHYSICIAN COMMUNICATION  CONCERNING:  Enoxaparin (Lovenox) for DVT Prophylaxis    RECOMMENDATION: Patient was prescribed enoxaparin 40mg  q24 hours for VTE prophylaxis.   Filed Weights   05/24/20 1813 05/25/20 0346 05/26/20 0348  Weight: 80.1 kg (176 lb 8 oz) 79.6 kg (175 lb 8 oz) 77.4 kg (170 lb 10.2 oz)    Body mass index is 29.29 kg/m.  Estimated Creatinine Clearance: 26.5 mL/min (A) (by C-G formula based on SCr of 1.59 mg/dL (H)).   Patient is candidate for enoxaparin 30mg  every 24 hours based on CrCl <29ml/min or Weight <45kg  DESCRIPTION: Pharmacy has adjusted enoxaparin dose per [facility] policy.  Patient is now receiving enoxaparin 30 mg every 24 hours   [name] 05/26/2020 8:47 AM

## 2020-05-26 NOTE — Care Management Important Message (Signed)
Important Message  Patient Details  Name: Kathryn [name] MRN: [id number] Date of Birth: 1933/12/16   Medicare Important Message Given:  Yes     Dannette Barbara 05/26/2020, 2:21 PM

## 2020-05-26 NOTE — Progress Notes (Signed)
° °  Heart Failure Nurse Navigator Note  HFmrEF 40-45%.  Grade 2 diastolic dysfunction.  Normal global longitudinal strain.  Normal right ventricular systolic pressure.  Mild left atrial enlargement.  Mild mitral regurgitation.  Mildly sclerotic aortic valve.  No evidence of aortic stenosis.  She presented to the emergency room with complaints of shortness of breath that woke her up out of a sound sleep.  She had noted ankle swelling a few days prior to admission and taking an extra furosemide as instructed by Dr. Rockey Situ.  She states prior to last night's episode she has been having brief episodes where she would wake up feeling that she could not get a deep enough breath, she would sit up for a brief time and then she could return to sleep.  She denied any palpitations, chest pressure or pain.  He was last hospitalized January 22 with heart failure.   Comorbidities:  COPD/emphysema Hyperlipidemia Coronary artery disease Arthritis Diabetes Hypertension    By Zio patch worn in January she was diagnosed with PSVT.  She has since been started on amiodarone.  She also has a chronic left bundle branch block and history of prior tobacco abuse.   Medications:  Amiodarone 200 mg daily Aspirin 81 mg daily Atorvastatin 40 mg daily Losartan 25 mg every evening   Labs:  Sodium 136, potassium 4, chloride 101, blood pressure 111/59.  24, BUN 49, creatinine 1.59 Intake 340 mL Output 2350 mL Weight is 77.4 kg yesterday was 79.6. Blood pressure 111/59. BMI 29.29   Assessment:  General-she is awake and alert sitting up in the bed watching television, in no acute distress.  HEENT-pupils are equal, unable to evaluate for JVD  Cardiac-heart tones of regular rate and rhythm.  Chest-breath sounds with fine crackles in the bases postiorly.  Abdomen-rounded soft non tender.  Musculoskeletal-no lower extremity edema noted  Psych-she is pleasant and appropriate makes good eye  contact.  Neurologic-she moves all extremities without difficulty.  Speech is clear.    With patient today, she states that she is still tired trying to catch up from the night that she lost sleep.  Also complains of a dry nonproductive cough.  Denied any increasing shortness of breath or chest discomfort.  Discussed her diet at home, states that she fixes her own meals which is usually a meat and maybe 1 or 2 vegetables for supper.  She is working on not adding any salt.     Pricilla Riffle RN [facility]

## 2020-05-26 NOTE — Progress Notes (Signed)
PROGRESS NOTE  Kathryn [name]:[id number] DOB: December 10, 1933 DOA: 05/24/2020 PCP: Jerrol Banana., MD  Brief History   87 year old woman PMH combined systolic diastolic CHF presented w/ resp distress requiring BiPAP in ED. Admitted for acute hypoxia, CHF, COPD exacerbations. Improving slowly, COPD resolved, CHF stabilizing. Has bacteremia, may be spurious.  A & P  Acute hypoxic resp failure secondary to acute on  chronic combined systolic/diastolic CHF LVEF 31-49%, grade 2 diastolic dysfunction  --Hypoxia resolved  Acute on chronic systolic/diastolic CHF --Some crackles posteriorly but with elevated creatinine we will hold dose today as patient is asymptomatic.  Reds vest check.  Reevaluate in a.m. --has appt w/ heart failure clinic 3/11 at 1 pm.  Bacteremia --From 1 bottle only, only 1 bottle collected initially.  Repeat blood cultures pending.  Identification consistent with contaminant which fits clinical picture. --Stop vancomycin.  Acute kidney injury --Suspect secondary to overdiuresis, doubt from vancomycin.  Nevertheless stop Lasix and vancomycin. --BMP in a.m.  Acute COPD exacerbation. No wheezes on exam and not on steroids --appears resolved.  DM type 2 --CBG stable.  Continue sliding scale insulin and meal coverage.  GERD --protonix  Aortic atherosclerosis --atorvastatin   Disposition Plan:  Discussion: as above improved, will downgrade to cardiac med surg. UOP 1900, down 1.4L since admission, weight down 1.5 lb  Status is: Inpatient  Remains inpatient appropriate because:IV treatments appropriate due to intensity of illness or inability to take PO and Inpatient level of care appropriate due to severity of illness   Dispo: The patient is from: Home              Anticipated d/c is to: Home              Patient currently is not medically stable to d/c.   Difficult to place patient No  DVT prophylaxis: enoxaparin (LOVENOX) injection 30 mg Start:  05/26/20 2200   Code Status: DNR Level of care: Progressive Cardiac Family Communication: none  Murray Hodgkins, MD  [facility] Direct contact: see [email or web address] (further directions at bottom of note if needed) 7PM-7AM contact night coverage as at bottom of note 05/26/2020, 3:55 PM  LOS: 2 days   Significant Hospital Events   .    Consults:  .    Procedures:  .   Significant Diagnostic Tests:  Marland Kitchen    Micro Data:  .    Antimicrobials:  .   Interval History/Subjective  CC: f/u sob  A little SOB today but overall feeling ok.  Objective   Vitals:  Vitals:   05/26/20 1120 05/26/20 1443  BP: 105/61 (!) 111/59  Pulse: 75 78  Resp: 19 18  Temp: 98.5 F (36.9 C) 98.1 F (36.7 C)  SpO2: 100% 98%    Exam:  Constitutional:   . Appears calm and comfortable ENMT:  . grossly normal hearing  Respiratory:  . CTA bilaterally anteriorly, no w/r/r. Posterior crackles noted . Respiratory effort normal.  Cardiovascular:  . RRR, no m/r/g . No LE extremity edema   Psychiatric:  . Mental status o Mood, affect appropriate  I have personally reviewed the following:   Today's Data  . CBG stable . Creatinine up to 1.59, BUN up to 49  Scheduled Meds: . amiodarone  200 mg Oral Daily  . aspirin EC  81 mg Oral Daily  . atorvastatin  40 mg Oral Daily  . azithromycin  250 mg Oral Daily  . carvedilol  6.25 mg Oral BID WC  .  enoxaparin (LOVENOX) injection  30 mg Subcutaneous Q24H  . insulin aspart  0-5 Units Subcutaneous QHS  . insulin aspart  0-9 Units Subcutaneous TID WC  . insulin aspart  2 Units Subcutaneous TID WC  . [START ON 05/27/2020] ipratropium-albuterol  3 mL Nebulization Daily  . levothyroxine  100 mcg Oral Q0600  . losartan  25 mg Oral QPM  . pantoprazole  40 mg Oral Daily  . sodium chloride flush  3 mL Intravenous Q12H   Continuous Infusions: . sodium chloride    . [START ON 05/27/2020] vancomycin      Principal Problem:   Acute on chronic respiratory  failure with hypoxia (HCC) Active Problems:   Hyperlipidemia   Essential hypertension   Hypothyroidism   Acute on chronic combined systolic (congestive) and diastolic (congestive) heart failure (HCC)   COPD exacerbation (HCC)   Diabetes mellitus without complication ([place])   Aortic atherosclerosis ([place])   AKI (acute kidney injury) ([place])   LOS: 2 days   How to contact the [facility] Attending or Consulting provider 7A - 7P or covering provider during after hours [place], for this patient?  1. Check the care team in [facility] and look for a) attending/consulting TR[facility] provider listed and b) the [facility] team listed 2. Log into [email or web address] and use [place] universal password to access. If you do not have the password, please contact the hospital operator. 3. Locate the [facility] provider you are looking for under [facility] and page to a number that you can be directly reached. 4. If you still have difficulty reaching the provider, please page the [facility] (Director on Call) for the Hospitalists listed on amion for assistance.

## 2020-05-27 ENCOUNTER — Encounter: Payer: Self-pay | Admitting: Internal Medicine

## 2020-05-27 DIAGNOSIS — J9601 Acute respiratory failure with hypoxia: Secondary | ICD-10-CM

## 2020-05-27 DIAGNOSIS — I700 Atherosclerosis of aorta: Secondary | ICD-10-CM

## 2020-05-27 DIAGNOSIS — E782 Mixed hyperlipidemia: Secondary | ICD-10-CM

## 2020-05-27 LAB — BASIC METABOLIC PANEL
Anion gap: 7 (ref 5–15)
BUN: 44 mg/dL — ABNORMAL HIGH (ref 8–23)
CO2: 27 mmol/L (ref 22–32)
Calcium: 9 mg/dL (ref 8.9–10.3)
Chloride: 104 mmol/L (ref 98–111)
Creatinine, Ser: 1.12 mg/dL — ABNORMAL HIGH (ref 0.44–1.00)
GFR, Estimated: 48 mL/min — ABNORMAL LOW (ref >60)
Glucose, Bld: 141 mg/dL — ABNORMAL HIGH (ref 70–99)
Potassium: 4.4 mmol/L (ref 3.5–5.1)
Sodium: 138 mmol/L (ref 135–145)

## 2020-05-27 LAB — CULTURE, BLOOD (SINGLE)

## 2020-05-27 LAB — GLUCOSE, CAPILLARY
Glucose-Capillary: 121 mg/dL — ABNORMAL HIGH (ref 70–99)
Glucose-Capillary: 165 mg/dL — ABNORMAL HIGH (ref 70–99)

## 2020-05-27 MED ORDER — FUROSEMIDE 20 MG PO TABS
20.0000 mg | ORAL_TABLET | Freq: Every day | ORAL | Status: DC
  Administered 2020-05-27: 20 mg via ORAL
  Filled 2020-05-27: qty 1

## 2020-05-27 MED ORDER — TORSEMIDE 20 MG PO TABS: ORAL_TABLET | ORAL | 1 refills | Status: DC

## 2020-05-27 MED ORDER — GUAIFENESIN ER 600 MG PO TB12: 600.0000 mg | ORAL_TABLET | Freq: Two times a day (BID) | ORAL | 0 refills | Status: DC | PRN

## 2020-05-27 MED ORDER — AZITHROMYCIN 250 MG PO TABS: 250.0000 mg | ORAL_TABLET | Freq: Every day | ORAL | 0 refills | Status: DC

## 2020-05-27 MED ORDER — TORSEMIDE 20 MG PO TABS: 20.0000 mg | ORAL_TABLET | Freq: Every day | ORAL | Status: DC

## 2020-05-27 MED ORDER — ENOXAPARIN SODIUM 40 MG/0.4ML ~~LOC~~ SOLN: 40.0000 mg | SUBCUTANEOUS | Status: DC

## 2020-05-27 NOTE — Progress Notes (Signed)
   Heart Failure Nurse Navigator Note  HFmrEF 40 to 45%.  Grade 2 diastolic dysfunction.  Normal global longitudinal strain.  Normal right ventricular systolic pressure.  Mild left atrial enlargement.  Mild mitral regurgitation.  Mildly sclerotic aortic valve with no evidence of aortic stenosis.   She presented to the emergency room with complaints of shortness of breath that woke her up out of a sound sleep.  She had noted ankle swelling a few days prior to admission had taken an extra furosemide as instructed by Dr. Rockey Situ.  He states prior to last night's episode of PND she had had brief episodes that she would wake up feeling that she could not get a deep enough breath and would sit up for a brief time and then return to sleep.  She denied any palpitations, chest pressure or pain.  She was last hospitalized on January 22 with heart failure.   Comorbidities:  COPD/emphysema Hyperlipidemia Coronary artery disease Arthritis Diabetes Hypertension Obesity    By Elwyn Reach patch worn in January she was diagnosed with PSVT.  Since that he has been started on amiodarone.  Also has a chronic left bundle branch block and prior history of tobacco abuse.   Medications:  Amiodarone 200 mg daily Aspirin 81 mg daily Atorvastatin 40 mg daily Coreg 6.25 mg 2 times daily with meals  Furosemide 20 mg daily   Labs:  Sodium 138, potassium 4.4, chloride 104, CO2 27, BUN 44, creatinine 1.12, yesterday was 1.59. Intake 1012 mL Output 1000 mL Blood pressure 115/58 BMI 30.04 Reds vest clip reading 35     Assessment:  General-she is awake and alert sitting up in bed in no acute distress continues to have dry hacking cough.  HEENT pupils are equal, wears glasses, no JVD.  Cardiac-heart tones of regular rate and rhythm.  No murmurs gallops or rubs appreciated  Chest-breath sounds are clear to posterior auscultation  Abdomen-rounded soft non tender  Musculoskeletal-there is no lower  extremity edema, pedal pulses are palpable at 2+.  Psych-is pleasant and appropriate, makes good eye contact.  Neurologic-speech is clear, moves all extremities without difficulty.    Spoke with patient today, states that she has not been up out of bed since she is attached to all this equipment.  Denies any shortness of breath with lying in bed.  She is being discharged home today. Has follow up in the heart failure clinic on March 11.   Pricilla Riffle RN [facility]

## 2020-05-27 NOTE — Plan of Care (Signed)
  Problem: Education: Goal: Knowledge of General Education information will improve Description: Including pain rating scale, medication(s)/side effects and non-pharmacologic comfort measures 05/27/2020 1347 by Alen Blew, RN Outcome: Adequate for Discharge 05/27/2020 1347 by Alen Blew, RN Outcome: Adequate for Discharge   Problem: Health Behavior/Discharge Planning: Goal: Ability to manage health-related needs will improve 05/27/2020 1347 by Alen Blew, RN Outcome: Adequate for Discharge 05/27/2020 1347 by Alen Blew, RN Outcome: Adequate for Discharge   Problem: Clinical Measurements: Goal: Ability to maintain clinical measurements within normal limits will improve 05/27/2020 1347 by Alen Blew, RN Outcome: Adequate for Discharge 05/27/2020 1347 by Alen Blew, RN Outcome: Adequate for Discharge Goal: Will remain free from infection 05/27/2020 1347 by Alen Blew, RN Outcome: Adequate for Discharge 05/27/2020 1347 by Alen Blew, RN Outcome: Adequate for Discharge Goal: Diagnostic test results will improve 05/27/2020 1347 by Alen Blew, RN Outcome: Adequate for Discharge 05/27/2020 1347 by Alen Blew, RN Outcome: Adequate for Discharge Goal: Respiratory complications will improve 05/27/2020 1347 by Alen Blew, RN Outcome: Adequate for Discharge 05/27/2020 1347 by Alen Blew, RN Outcome: Adequate for Discharge Goal: Cardiovascular complication will be avoided 05/27/2020 1347 by Alen Blew, RN Outcome: Adequate for Discharge 05/27/2020 1347 by Alen Blew, RN Outcome: Adequate for Discharge   Problem: Activity: Goal: Risk for activity intolerance will decrease 05/27/2020 1347 by Alen Blew, RN Outcome: Adequate for Discharge 05/27/2020 1347 by Alen Blew, RN Outcome: Adequate for Discharge   Problem: Nutrition: Goal: Adequate nutrition will be maintained 05/27/2020 1347 by Alen Blew, RN Outcome:  Adequate for Discharge 05/27/2020 1347 by Alen Blew, RN Outcome: Adequate for Discharge   Problem: Coping: Goal: Level of anxiety will decrease 05/27/2020 1347 by Alen Blew, RN Outcome: Adequate for Discharge 05/27/2020 1347 by Alen Blew, RN Outcome: Adequate for Discharge   Problem: Elimination: Goal: Will not experience complications related to bowel motility 05/27/2020 1347 by Alen Blew, RN Outcome: Adequate for Discharge 05/27/2020 1347 by Alen Blew, RN Outcome: Adequate for Discharge Goal: Will not experience complications related to urinary retention 05/27/2020 1347 by Alen Blew, RN Outcome: Adequate for Discharge 05/27/2020 1347 by Alen Blew, RN Outcome: Adequate for Discharge   Problem: Pain Managment: Goal: General experience of comfort will improve 05/27/2020 1347 by Alen Blew, RN Outcome: Adequate for Discharge 05/27/2020 1347 by Alen Blew, RN Outcome: Adequate for Discharge   Problem: Safety: Goal: Ability to remain free from injury will improve 05/27/2020 1347 by Alen Blew, RN Outcome: Adequate for Discharge 05/27/2020 1347 by Alen Blew, RN Outcome: Adequate for Discharge   Problem: Skin Integrity: Goal: Risk for impaired skin integrity will decrease 05/27/2020 1347 by Alen Blew, RN Outcome: Adequate for Discharge 05/27/2020 1347 by Alen Blew, RN Outcome: Adequate for Discharge

## 2020-05-27 NOTE — Evaluation (Signed)
Physical Therapy Evaluation Patient Details Name: Kathryn [name] MRN: [id number] DOB: [birth year] Today's Date: 05/27/2020   History of Present Illness  87 year old woman PMH combined systolic diastolic CHF presented w/ resp distress requiring BiPAP in ED. Admitted for acute hypoxia, CHF, COPD exacerbations. Improving slowly, COPD resolved, CHF stabilizing. Has bacteremia, may be spurious.    Clinical Impression  Patient alert, agreeable to PT, denied pain. Pt reported at baseline she is modI/independent at baseline, lives in a mother in law suite off her son's house. Brief donned due to incontinence issues. Pt demonstrated supine to sit with modI with bed rails, good sitting balance noted. Sit <> stand with RW and supervision, able to stand at sink and brush her teeth. She ambulated ~130ft with RW and CGA. No LOB noted, spO2 on room air >90%. Mild shortness of breath reported by pt after returning to chair, but stated that is chronic for her. The patient demonstrated and reported return to baseline level of functioning, no further acute PT needs indicated. PT to sign off. Please reconsult PT if pt status changes or acute needs are identified.      Follow Up Recommendations No PT follow up    Equipment Recommendations  None recommended by PT    Recommendations for Other Services       Precautions / Restrictions Precautions Precautions: Fall Restrictions Weight Bearing Restrictions: No      Mobility  Bed Mobility Overal bed mobility: Needs Assistance Bed Mobility: Supine to Sit     Supine to sit: Modified independent (Device/Increase time)  Transfers Overall transfer level: Needs assistance Equipment used: Rolling walker (2 wheeled) Transfers: Sit to/from Stand Sit to Stand: Supervision Stand pivot transfers: Supervision          Ambulation/Gait Ambulation/Gait assistance: Min guard Gait Distance (Feet): 170 Feet Assistive device: Rolling walker (2 wheeled)        General Gait Details: no LOB noted, spO2 WFLs. Pt reported some shortness of breath after ambulation, reported that this does happen at baseline  Stairs            Wheelchair Mobility    Modified Rankin (Stroke Patients Only)       Balance Overall balance assessment: Needs assistance Sitting-balance support: Feet supported Sitting balance-Leahy Scale: Normal     Standing balance support: During functional activity Standing balance-Leahy Scale: Good Standing balance comment: able to stand at sink and brush teeth                             Pertinent Vitals/Pain Pain Assessment: No/denies pain    Home Living Family/patient expects to be discharged to:: Private residence Living Arrangements: Children Available Help at Discharge: Family;Available PRN/intermittently Type of Home: House Home Access: Stairs to enter Entrance Stairs-Rails: Can reach both Entrance Stairs-Number of Steps: 2 Home Layout: One level Home Equipment: Walker - 2 wheels;Walker - 4 wheels;Wheelchair - manual;Cane - single point      Prior Function Level of Independence: Independent         Comments: pt has DME but is not using currently. performs most IADLs independently. Pt living in apartment within son's home (mother in law suite) and she reports using SPC in community only.     Hand Dominance   Dominant Hand: Right    Extremity/Trunk Assessment   Upper Extremity Assessment Upper Extremity Assessment: Defer to OT evaluation    Lower Extremity Assessment Lower Extremity Assessment: Overall WFL for tasks  assessed    Cervical / Trunk Assessment Cervical / Trunk Assessment: Normal  Communication   Communication: No difficulties  Cognition Arousal/Alertness: Awake/alert Behavior During Therapy: WFL for tasks assessed/performed Overall Cognitive Status: Within Functional Limits for tasks assessed                                        General  Comments      Exercises     Assessment/Plan    PT Assessment Patent does not need any further PT services  PT Problem List         PT Treatment Interventions      PT Goals (Current goals can be found in the Care Plan section)  Acute Rehab PT Goals Patient Stated Goal: to go home    Frequency     Barriers to discharge        Co-evaluation               AM-PAC PT "6 Clicks" Mobility  Outcome Measure Help needed turning from your back to your side while in a flat bed without using bedrails?: None Help needed moving from lying on your back to sitting on the side of a flat bed without using bedrails?: None Help needed moving to and from a bed to a chair (including a wheelchair)?: None Help needed standing up from a chair using your arms (e.g., wheelchair or bedside chair)?: None Help needed to walk in hospital room?: None Help needed climbing 3-5 steps with a railing? : A Little 6 Click Score: 23    End of Session Equipment Utilized During Treatment: Gait belt Activity Tolerance: Patient tolerated treatment well Patient left: in chair;with chair alarm set;with call bell/phone within reach Nurse Communication: Mobility status PT Visit Diagnosis: Other abnormalities of gait and mobility (R26.89);Muscle weakness (generalized) (M62.81)    Time: 0998-3382 PT Time Calculation (min) (ACUTE ONLY): 19 min   Charges:   PT Evaluation $PT Eval Low Complexity: 1 Low PT Treatments $Therapeutic Exercise: 8-22 mins        Lieutenant Diego PT, DPT 1:30 PM,05/27/20

## 2020-05-27 NOTE — Discharge Summary (Signed)
Physician Discharge Summary  Kathryn [name] JME:[id number] DOB: [birth year] DOA: 05/24/2020  PCP: Jerrol Banana., MD  Admit date: 05/24/2020 Discharge date: 05/27/2020  Recommendations for Outpatient Follow-up:   Acute on chronic systolic/diastolic CHF  DM type 2 --Can resume Metformin but given CHF, consider alternative agent.  Defer to PCP.    Follow-up Information    [facility] Follow up on 06/03/2020.   Specialty: Cardiology Why: at 1:00pm. Enter through the [place] entrance Contact information: [place] [phone]               Discharge Diagnoses: Principal diagnosis is #1 Principal Problem:   Acute on chronic respiratory failure with hypoxia [facility]) Active Problems:   Hyperlipidemia   Essential hypertension   Hypothyroidism   Acute on chronic combined systolic (congestive) and diastolic (congestive) heart failure (HCC)   COPD exacerbation (HCC)   Diabetes mellitus without complication ([place])   Aortic atherosclerosis ([place])   AKI (acute kidney injury) ([place])   Discharge Condition: improved Disposition: home  Diet recommendation:  Diet Orders (From admission, onward)    Start     Ordered   05/27/20 0000  Diet - low sodium heart healthy        05/27/20 1329   05/24/20 1218  Diet heart healthy/carb modified Room service appropriate? Yes; Fluid consistency: Thin; Fluid restriction: 1500 mL Fluid  Diet effective now       Question Answer Comment  Diet-HS Snack? Nothing   Room service appropriate? Yes   Fluid consistency: Thin   Fluid restriction: 1500 mL Fluid      05/24/20 1217           Filed Weights   05/26/20 0348 05/27/20 0542 05/27/20 1052  Weight: 77.4 kg 80.4 kg 79.4 kg    HPI/Hospital Course:   87 year old woman PMH combined systolic diastolic CHF presented w/ resp distress requiring BiPAP in ED. Admitted for acute hypoxia, CHF, COPD  exacerbations. Improving slowly, COPD resolved, CHF improved with diuresis.  Seen by cardiology, felt stable for discharge.  Hospital course uncomplicated.  Acute hypoxic resp failure secondary to acute on  chronic combined systolic/diastolic CHF LVEF 94-17%, grade 2 diastolic dysfunction  --Hypoxia resolved with diuresis.  Acute on chronic systolic/diastolic CHF --Some crackles posteriorly but clinically appears well.  Appreciate cardiology evaluation recommendations.  Home on torsemide, follow-up with heart failure clinic next week.  Acute kidney injury --Secondary to overdiuresis.  Resolved.  Acute COPD exacerbation. No wheezes on exam and not on steroids --Resolved with minimal treatment.  Complete Zithromax.  DM type 2 --CBG stable.  Can resume Metformin but given CHF, consider alternative agent.  Defer to PCP.  GERD --protonix  Aortic atherosclerosis --atorvastatin     Consults:  . Cardiology   Today's assessment: S: CC: f/u CHF  Feels well, breathing fine, ready to go home  O: Vitals:  Vitals:   05/27/20 0746 05/27/20 1153  BP: (!) 115/58 119/71  Pulse: 67 69  Resp: 16 16  Temp: 97.9 F (36.6 C) 98 F (36.7 C)  SpO2: 99% 99%    Constitutional:  . Appears calm and comfortable, well-appearing ENMT:  . grossly normal hearing  Respiratory:  . CTA bilaterally anteriorly, no w/r/r. Posterior crackles noted. Marland Kitchen Respiratory effort normal.  Cardiovascular:  . RRR, no m/r/g . No LE extremity edema   Psychiatric:  . Mental status o Mood, affect appropriate  CBG stable Creatinine  back down to 1.12  Discharge Instructions  Discharge Instructions    (HEART FAILURE PATIENTS) Call MD:  Anytime you have any of the following symptoms: 1) 3 pound weight gain in 24 hours or 5 pounds in 1 week 2) shortness of breath, with or without a dry hacking cough 3) swelling in the hands, feet or stomach 4) if you have to sleep on extra pillows at night in order to breathe.    Complete by: As directed    Call MD for:  difficulty breathing, headache or visual disturbances   Complete by: As directed    Diet - low sodium heart healthy   Complete by: As directed    Heart Failure patients record your daily weight using the same scale at the same time of day   Complete by: As directed    Increase activity slowly   Complete by: As directed      Allergies as of 05/27/2020      Reactions   Oxycodone Other (See Comments)   Mental status change   Prednisone    swelling, bruising.      Medication List    STOP taking these medications   furosemide 20 MG tablet Commonly known as: LASIX     TAKE these medications   acetaminophen 500 MG tablet Commonly known as: TYLENOL Take 1,000 mg by mouth every 6 (six) hours as needed. Pain   albuterol (2.5 MG/3ML) 0.083% nebulizer solution Commonly known as: PROVENTIL Take 3 mLs (2.5 mg total) by nebulization every 6 (six) hours as needed for wheezing or shortness of breath.   amiodarone 200 MG tablet Commonly known as: PACERONE Take 1 tablet (200 mg) by mouth one daily as directed   aspirin EC 81 MG tablet Take 1 tablet (81 mg total) by mouth daily. Swallow whole.   atorvastatin 40 MG tablet Commonly known as: LIPITOR Take 1 tablet (40 mg total) by mouth daily.   azithromycin 250 MG tablet Commonly known as: ZITHROMAX Take 1 tablet (250 mg total) by mouth daily. Take only and last does 3/5 AM   carvedilol 6.25 MG tablet Commonly known as: COREG Take 1 tablet (6.25 mg total) by mouth 2 (two) times daily with a meal.   glucose blood test strip Commonly known as: Contour Next Test Check sugar once daily  DX E11.9   guaiFENesin 600 MG 12 hr tablet Commonly known as: Mucinex Take 1 tablet (600 mg total) by mouth 2 (two) times daily as needed for up to 20 days.   hydrOXYzine 10 MG tablet Commonly known as: ATARAX/VISTARIL TAKE 1 TO 2 TABLETS(10 TO 20 MG) BY MOUTH EVERY 6 HOURS AS NEEDED   levothyroxine 100  MCG tablet Commonly known as: SYNTHROID TAKE 1 TABLET(100 MCG) BY MOUTH DAILY BEFORE BREAKFAST   losartan 25 MG tablet Commonly known as: COZAAR Take 1 tablet (25 mg total) by mouth every evening.   metFORMIN 1000 MG tablet Commonly known as: GLUCOPHAGE TAKE 1 TABLET(1000 MG) BY MOUTH DAILY WITH SUPPER   omeprazole 20 MG capsule Commonly known as: PRILOSEC TAKE 1 CAPSULE(20 MG) BY MOUTH DAILY   tiotropium 18 MCG inhalation capsule Commonly known as: SPIRIVA Place 1 capsule (18 mcg total) into inhaler and inhale daily as needed.   torsemide 20 MG tablet Commonly known as: DEMADEX Take 20 mg po daily in AM. Take one extra dose 20mg  PRN 3lb weight gain or abdominal fullness.      Allergies  Allergen Reactions  . Oxycodone Other (See  Comments)    Mental status change  . Prednisone     swelling, bruising.    The results of significant diagnostics from this hospitalization (including imaging, microbiology, ancillary and laboratory) are listed below for reference.    Significant Diagnostic Studies: CT Angio Chest PE W and/or Wo Contrast  Result Date: 05/24/2020 CLINICAL DATA:  PE suspected, high probability. Shortness of breath for 3 days. Progressive symptoms this morning. EXAM: CT ANGIOGRAPHY CHEST WITH CONTRAST TECHNIQUE: Multidetector CT imaging of the chest was performed using the standard protocol during bolus administration of intravenous contrast. Multiplanar CT image reconstructions and MIPs were obtained to evaluate the vascular anatomy. CONTRAST:  64mL OMNIPAQUE IOHEXOL 350 MG/ML SOLN COMPARISON:  One-view chest x-ray 05/24/2020. CT chest with contrast 10/22/2012 FINDINGS: Cardiovascular: Heart size is normal. Atherosclerotic calcifications are present at the aortic arch great vessel origins. No aneurysm is present. Atherosclerotic calcifications are present in the coronary arteries. Pulmonary artery opacification is excellent. No focal filling defects are present to suggest  pulmonary emboli. Study is mildly degraded by breathing motion. Pulmonary artery size is normal. Mediastinum/Nodes: Subcentimeter paratracheal and prevascular lymph nodes are likely reactive. Is slightly larger, but were present on the prior study. The esophagus is within normal limits. Thoracic inlet is unremarkable. Lungs/Pleura: Bilateral pleural effusions are present. Dependent airspace disease is associated. No other focal nodule, mass, or airspace disease is present. Airways are patent. Breathing motion is noted. Upper Abdomen: Visualized upper abdomen is remarkable for atherosclerotic change. No solid organ lesions are present. Musculoskeletal: Vertebral body heights and alignment are normal. No significant listhesis is present. Ribs are within normal limits bilaterally. Advanced degenerative changes are noted at both shoulders. Review of the MIP images confirms the above findings. IMPRESSION: 1. No pulmonary embolus. 2. Bilateral pleural effusions with associated dependent airspace disease. Findings are consistent with congestive heart failure. Infection is considered less likely. 3. Coronary artery disease. 4. Aortic Atherosclerosis (ICD10-I70.0). Electronically Signed   By: San Morelle M.D.   On: 05/24/2020 11:30   US Venous Img Lower Bilateral (DVT)  Result Date: 05/25/2020 CLINICAL DATA:  Positive D-dimer, varicose veins EXAM: BILATERAL LOWER EXTREMITY VENOUS DOPPLER ULTRASOUND TECHNIQUE: Gray-scale sonography with compression, as well as color and duplex ultrasound, were performed to evaluate the deep venous system(s) from the level of the common femoral vein through the popliteal and proximal calf veins. COMPARISON:  None. FINDINGS: VENOUS Normal compressibility of the common femoral, superficial femoral, and popliteal veins, as well as the visualized calf veins. Visualized portions of profunda femoral vein and great saphenous vein unremarkable. No filling defects to suggest DVT on  grayscale or color Doppler imaging. Doppler waveforms show normal direction of venous flow, normal respiratory phasicity and response to augmentation. OTHER None. Limitations: none IMPRESSION: No femoropopliteal DVT nor evidence of DVT within the visualized calf veins. If clinical symptoms are inconsistent or if there are persistent or worsening symptoms, further imaging (possibly involving the iliac veins) may be warranted. Electronically Signed   By: Lucrezia Europe M.D.   On: 05/25/2020 10:19   DG Chest Port 1 View  Result Date: 05/24/2020 CLINICAL DATA:  Possible sepsis Shortness of breath for 3 days EXAM: PORTABLE CHEST 1 VIEW COMPARISON:  03/26/2020 FINDINGS: Heart size at upper limits of normal. Mild pulmonary vascular congestion again seen. There has been interval improvement in aeration of the lungs, however focal opacity still remains at the right lung base suspicious for pneumonia. Advanced degenerative changes of the glenohumeral joints bilaterally. IMPRESSION: Patchy residual  opacity at the right lung base suspicious for pneumonia. Electronically Signed   By: Miachel Roux M.D.   On: 05/24/2020 08:20    Microbiology: Recent Results (from the past 240 hour(s))  Blood culture (routine single)     Status: Abnormal   Collection Time: 05/24/20  7:46 AM   Specimen: BLOOD  Result Value Ref Range Status   Specimen Description   Final    BLOOD LEFT [facility] Performed at [facility], [address] [place], [place] [address]    Special Requests   Final    BOTTLES DRAWN AEROBIC AND ANAEROBIC Blood Culture results may not be optimal due to an excessive volume of blood received in culture bottles Performed at [facility] [address] [place], [place] [address]    Culture  Setup Time   Final    Organism ID to follow [place] BOTTLES CRITICAL RESULT CALLED TO, READ BACK BY AND VERIFIED WITH: Harlow Ohms D AT 1645 ON 05/25/20  SNG Performed at [facility], [address] [place], [place] [address]    Culture (A)  Final    STAPHYLOCOCCUS CAPITIS THE SIGNIFICANCE OF ISOLATING THIS ORGANISM FROM A SINGLE SET OF BLOOD CULTURES WHEN MULTIPLE SETS ARE DRAWN IS UNCERTAIN. PLEASE NOTIFY THE MICROBIOLOGY DEPARTMENT WITHIN ONE WEEK IF SPECIATION AND SENSITIVITIES ARE REQUIRED. Performed at [facility], [place] [address], [place], [place] [address]    Report Status 05/27/2020 FINAL  Final  Urine culture     Status: Abnormal   Collection Time: 05/24/20  7:46 AM   Specimen: In/Out Cath Urine  Result Value Ref Range Status   Specimen Description   Final    IN/OUT CATH URINE Performed at [facility], [address] [place], [place] [address]    Special Requests   Final    NONE Performed at [facility] [address] [place], [place] [address]    Culture MULTIPLE SPECIES PRESENT, SUGGEST RECOLLECTION (A)  Final   Report Status 05/26/2020 FINAL  Final  Blood Culture ID Panel (Reflexed)     Status: Abnormal   Collection Time: 05/24/20  7:46 AM  Result Value Ref Range Status   Enterococcus faecalis NOT DETECTED NOT DETECTED Final   Enterococcus Faecium NOT DETECTED NOT DETECTED Final   Listeria monocytogenes NOT DETECTED NOT DETECTED Final   Staphylococcus species DETECTED (A) NOT DETECTED Final    Comment: CRITICAL RESULT CALLED TO, READ BACK BY AND VERIFIED WITH: BRANDON BEERS AT 1645 ON 05/25/20 SNG    Staphylococcus aureus (BCID) NOT DETECTED NOT DETECTED Final   Staphylococcus epidermidis NOT DETECTED NOT DETECTED Final   Staphylococcus lugdunensis NOT DETECTED NOT DETECTED Final   Streptococcus species NOT DETECTED NOT DETECTED Final   Streptococcus agalactiae NOT DETECTED NOT DETECTED Final   Streptococcus pneumoniae NOT DETECTED NOT DETECTED Final   Streptococcus pyogenes NOT DETECTED NOT DETECTED Final   A.calcoaceticus-baumannii NOT DETECTED NOT DETECTED Final    Bacteroides fragilis NOT DETECTED NOT DETECTED Final   Enterobacterales NOT DETECTED NOT DETECTED Final   Enterobacter cloacae complex NOT DETECTED NOT DETECTED Final   Escherichia coli NOT DETECTED NOT DETECTED Final   Klebsiella aerogenes NOT DETECTED NOT DETECTED Final   Klebsiella oxytoca NOT DETECTED NOT DETECTED Final   Klebsiella pneumoniae NOT DETECTED NOT DETECTED Final   Proteus species NOT DETECTED NOT DETECTED Final   Salmonella species NOT DETECTED NOT DETECTED Final   Serratia marcescens  NOT DETECTED NOT DETECTED Final   Haemophilus influenzae NOT DETECTED NOT DETECTED Final   Neisseria meningitidis NOT DETECTED NOT DETECTED Final   Pseudomonas aeruginosa NOT DETECTED NOT DETECTED Final   Stenotrophomonas maltophilia NOT DETECTED NOT DETECTED Final   Candida albicans NOT DETECTED NOT DETECTED Final   Candida auris NOT DETECTED NOT DETECTED Final   Candida glabrata NOT DETECTED NOT DETECTED Final   Candida krusei NOT DETECTED NOT DETECTED Final   Candida parapsilosis NOT DETECTED NOT DETECTED Final   Candida tropicalis NOT DETECTED NOT DETECTED Final   Cryptococcus neoformans/gattii NOT DETECTED NOT DETECTED Final    Comment: Performed at [facility], [address] [place], [place] [address]  Resp Panel by RT-PCR (Flu A&B, Covid) Nasopharyngeal Swab     Status: None   Collection Time: 05/24/20  7:47 AM   Specimen: Nasopharyngeal Swab; Nasopharyngeal(NP) swabs in vial transport medium  Result Value Ref Range Status   SARS Coronavirus 2 by RT PCR NEGATIVE NEGATIVE Final    Comment: (NOTE) SARS-CoV-2 target nucleic acids are NOT DETECTED.  The SARS-CoV-2 RNA is generally detectable in upper respiratory specimens during the acute phase of infection. The lowest concentration of SARS-CoV-2 viral copies this assay can detect is 138 copies/mL. A negative result does not preclude SARS-Cov-2 infection and should not be used as the sole basis for treatment or other  patient management decisions. A negative result may occur with  improper specimen collection/handling, submission of specimen other than nasopharyngeal swab, presence of viral mutation(s) within the areas targeted by this assay, and inadequate number of viral copies(<138 copies/mL). A negative result must be combined with clinical observations, patient history, and epidemiological information. The expected result is Negative.  Fact Sheet for Patients:  EntrepreneurPulse.com.au  Fact Sheet for Healthcare Providers:  IncredibleEmployment.be  This test is no t yet approved or cleared by the [place] FDA and  has been authorized for detection and/or diagnosis of SARS-CoV-2 by FDA under an Emergency Use Authorization (EUA). This EUA will remain  in effect (meaning this test can be used) for the duration of the COVID-19 declaration under Section 564(b)(1) of the Act, 21 U.S.C.section 360bbb-3(b)(1), unless the authorization is terminated  or revoked sooner.       Influenza A by PCR NEGATIVE NEGATIVE Final   Influenza B by PCR NEGATIVE NEGATIVE Final    Comment: (NOTE) The Xpert Xpress SARS-CoV-2/FLU/RSV plus assay is intended as an aid in the diagnosis of influenza from Nasopharyngeal swab specimens and should not be used as a sole basis for treatment. Nasal washings and aspirates are unacceptable for Xpert Xpress SARS-CoV-2/FLU/RSV testing.  Fact Sheet for Patients: EntrepreneurPulse.com.au  Fact Sheet for Healthcare Providers: IncredibleEmployment.be  This test is not yet approved or cleared by the [place] FDA and has been authorized for detection and/or diagnosis of SARS-CoV-2 by FDA under an Emergency Use Authorization (EUA). This EUA will remain in effect (meaning this test can be used) for the duration of the COVID-19 declaration under Section 564(b)(1) of the Act, 21 U.S.C. section  360bbb-3(b)(1), unless the authorization is terminated or revoked.  Performed at [facility], [address] [place], [place] [address]   Culture, blood (Routine X 2) w Reflex to ID Panel     Status: None (Preliminary result)   Collection Time: 05/25/20  6:46 PM   Specimen: BLOOD  Result Value Ref Range Status   Specimen Description BLOOD RIGHT ANTECUBITAL  Final   Special Requests   Final  BOTTLES DRAWN AEROBIC AND ANAEROBIC Blood Culture adequate volume   Culture   Final    NO GROWTH 2 DAYS Performed at [facility], [address] [place], [place] [address]    Report Status PENDING  Incomplete  Culture, blood (Routine X 2) w Reflex to ID Panel     Status: None (Preliminary result)   Collection Time: 05/25/20  6:46 PM   Specimen: BLOOD  Result Value Ref Range Status   Specimen Description BLOOD BLOOD RIGHT HAND  Final   Special Requests   Final    BOTTLES DRAWN AEROBIC AND ANAEROBIC Blood Culture results may not be optimal due to an excessive volume of blood received in culture bottles   Culture   Final    NO GROWTH 2 DAYS Performed at [facility], [address] [place], [place] [address]    Report Status PENDING  Incomplete     Labs: Basic Metabolic Panel: Recent Labs  Lab 05/24/20 0746 05/25/20 0617 05/26/20 0517 05/27/20 0612  NA 138 139 136 138  K 4.5 4.7 4.0 4.4  CL 105 101 101 104  CO2 23 27 24 27   GLUCOSE 297* 190* 169* 141*  BUN 28* 31* 49* 44*  CREATININE 0.91 0.91 1.59* 1.12*  CALCIUM 9.8 10.1 8.9 9.0  MG  --  2.1  --   --    Liver Function Tests: Recent Labs  Lab 05/24/20 0746  AST 22  ALT 20  ALKPHOS 73  BILITOT 0.8  PROT 6.6  ALBUMIN 3.9   CBC: Recent Labs  Lab 05/24/20 0746 05/25/20 0617  WBC 5.4 13.2*  NEUTROABS 2.8  --   HGB 10.6* 11.6*  HCT 36.4 37.6  MCV 84.3 81.6  PLT 249 305    Recent Labs    03/26/20 2329 05/24/20 0747  BNP 337.8* 371.5*   CBG: Recent Labs  Lab 05/26/20 1205  05/26/20 1636 05/26/20 2048 05/27/20 0746 05/27/20 1152  GLUCAP 204* 106* 178* 121* 165*    Principal Problem:   Acute on chronic respiratory failure with hypoxia (HCC) Active Problems:   Hyperlipidemia   Essential hypertension   Hypothyroidism   Acute on chronic combined systolic (congestive) and diastolic (congestive) heart failure (HCC)   COPD exacerbation (HCC)   Diabetes mellitus without complication ([place])   Aortic atherosclerosis ([place])   AKI (acute kidney injury) ([place])   Time coordinating discharge: 35 minutes  Signed:  Murray Hodgkins, MD  [facility]  05/27/2020, 1:29 PM

## 2020-05-27 NOTE — Progress Notes (Addendum)
PHARMACIST - PHYSICIAN COMMUNICATION  CONCERNING:  Enoxaparin (Lovenox) for DVT Prophylaxis    RECOMMENDATION: Patient was prescribed enoxaparin 30mg  q24 hours for VTE prophylaxis.   Filed Weights   05/25/20 0346 05/26/20 0348 05/27/20 0542  Weight: 79.6 kg (175 lb 8 oz) 77.4 kg (170 lb 10.2 oz) 80.4 kg (177 lb 4.8 oz)    Body mass index is 30.43 kg/m.  Estimated Creatinine Clearance: 38.4 mL/min (A) (by C-G formula based on SCr of 1.12 mg/dL (H)).   Patient is candidate for enoxaparin 40mg  every 24 hours based on CrCl >44ml/min and Weight >45kg  DESCRIPTION: Pharmacy has adjusted enoxaparin dose per [facility] policy.  Patient is now receiving enoxaparin 40 mg every 24 hours   [name] 05/27/2020 8:26 AM

## 2020-05-27 NOTE — Evaluation (Signed)
Occupational Therapy Evaluation Patient Details Name: Kathryn [name] MRN: [id number] DOB: 11/30/1933 Today's Date: 05/27/2020    History of Present Illness 87 year old woman PMH combined systolic diastolic CHF presented w/ resp distress requiring BiPAP in ED. Admitted for acute hypoxia, CHF, COPD exacerbations. Improving slowly, COPD resolved, CHF stabilizing. Has bacteremia, may be spurious.   Clinical Impression   Patient presenting with decreased I in self care, balance, functional mobility/transfers, endurance, and safety awareness. Patient reports living at home in a mother in law suite attached to son's home  PTA. Pt reports being independent with all aspects of self care, IADLS, and still drives. She reports using a SPC in the community. Family does assist with getting groceries. Patient currently functioning at close supervision overall within hospital room. Patient will benefit from acute OT to increase overall independence in the areas of ADLs, functional mobility, and safety awareness in order to safely discharge home with family.    Follow Up Recommendations  No OT follow up;Supervision - Intermittent    Equipment Recommendations  None recommended by OT       Precautions / Restrictions Precautions Precautions: Fall      Mobility Bed Mobility Overal bed mobility: Needs Assistance Bed Mobility: Supine to Sit;Sit to Supine     Supine to sit: Supervision Sit to supine: Supervision   General bed mobility comments: no physical assist needed with min cuing for technique    Transfers Overall transfer level: Needs assistance Equipment used: None Transfers: Sit to/from Stand;Stand Pivot Transfers Sit to Stand: Supervision Stand pivot transfers: Supervision            Balance Overall balance assessment: Needs assistance Sitting-balance support: Feet supported Sitting balance-Leahy Scale: Normal     Standing balance support: During functional activity Standing  balance-Leahy Scale: Good                             ADL either performed or assessed with clinical judgement   ADL Overall ADL's : Needs assistance/impaired Eating/Feeding: Modified independent                                     General ADL Comments: Pt is overall supervision with limited standing tasks this session. Pt not wanting to ambulate within the room because she will "pee on myself". OT offered to assist pt to bathroom but she declined.     Vision Baseline Vision/History: Wears glasses Wears Glasses: At all times Patient Visual Report: No change from baseline              Pertinent Vitals/Pain Pain Assessment: No/denies pain     Hand Dominance Right   Extremity/Trunk Assessment Upper Extremity Assessment Upper Extremity Assessment: Overall WFL for tasks assessed (pt does have limitations at baseline and pt reports, " I need both my shoulders done" but she is functional for all aspects of care.)   Lower Extremity Assessment Lower Extremity Assessment: Overall WFL for tasks assessed       Communication Communication Communication: No difficulties   Cognition Arousal/Alertness: Awake/alert Behavior During Therapy: WFL for tasks assessed/performed Overall Cognitive Status: Within Functional Limits for tasks assessed  Home Living Family/patient expects to be discharged to:: Private residence Living Arrangements: Children Available Help at Discharge: Family;Available PRN/intermittently Type of Home: House Home Access: Stairs to enter [employer] of Steps: 2 Entrance Stairs-Rails: Can reach both Home Layout: One level     Bathroom Shower/Tub: Walk-in shower         Home Equipment: Environmental consultant - 2 wheels;Walker - 4 wheels;Wheelchair - manual;Cane - single point          Prior Functioning/Environment Level of Independence: Independent         Comments: pt has DME but is not using currently. performs most IADLs independently. Pt living in apartment within son's home (mother in law suite) and she reports using SPC in community only.        OT Problem List: Decreased strength;Decreased activity tolerance;Impaired balance (sitting and/or standing);Decreased safety awareness      OT Treatment/Interventions: Self-care/ADL training;Therapeutic exercise;Energy conservation;DME and/or AE instruction;Therapeutic activities;Patient/family education;Balance training;Manual therapy    OT Goals(Current goals can be found in the care plan section) Acute Rehab OT Goals Patient Stated Goal: to go home OT Goal Formulation: With patient Time For Goal Achievement: 06/10/20 Potential to Achieve Goals: Good ADL Goals Pt Will Perform Grooming: with modified independence;standing Pt Will Transfer to Toilet: with modified independence;ambulating Pt Will Perform Toileting - Clothing Manipulation and hygiene: with modified independence;sit to/from stand Pt Will Perform Tub/Shower Transfer: with modified independence;shower seat  OT Frequency: Min 2X/week   Barriers to D/C:    none at this time          AM-PAC OT "6 Clicks" Daily Activity     Outcome Measure Help from another person eating meals?: None Help from another person taking care of personal grooming?: None Help from another person toileting, which includes using toliet, bedpan, or urinal?: None Help from another person bathing (including washing, rinsing, drying)?: None Help from another person to put on and taking off regular upper body clothing?: None Help from another person to put on and taking off regular lower body clothing?: None 6 Click Score: 24   End of Session Nurse Communication: Mobility status  Activity Tolerance: Patient tolerated treatment well Patient left: in bed;with call bell/phone within reach  OT Visit Diagnosis: Unsteadiness on feet (R26.81);Muscle  weakness (generalized) (M62.81)                Time: 9833-8250 OT Time Calculation (min): 19 min Charges:  OT General Charges $OT Visit: 1 Visit OT Evaluation $OT Eval Low Complexity: 1 Low OT Treatments $Self Care/Home Management : 8-22 mins   Darleen Crocker, MS, OTR/L , CBIS ascom [phone]  05/27/20, 1:08 PM   05/27/2020, 10:58 AM

## 2020-05-27 NOTE — TOC Initial Note (Signed)
Transition of Care [facility]) - Initial/Assessment Note    Patient Details  Name: Kathryn [name] MRN: [id number] Date of Birth: November 04, 1933  [facility] CM/SW Contact:    Kerin Salen, RN Phone Number: 05/27/2020, 12:14 PM  Clinical Narrative:  Spoke with patient, who is alert and oriented x4, verbalize living in mother in law suite on Son's property.. Ambulates with walker, although has wheelchair and cane. Use [facility] in [place], Sister of Son will pick up medication. Patient states she still drive however very little. Can cook meals however go shopping with Sister. Denies having [place] services, however would like to have assistance once a week.                 Expected Discharge Plan: Home/Self Care Barriers to Discharge: Continued Medical Work up   Patient Goals and CMS Choice Patient states their goals for this hospitalization and ongoing recovery are:: To return home.   Choice offered to / list presented to : NA  Expected Discharge Plan and Services Expected Discharge Plan: Home/Self Care   Discharge Planning Services: NA Post Acute Care Choice: NA Living arrangements for the past 2 months: Single Family Home (Mother in law suite on Son's property.)                 DME Arranged: N/A DME Agency: NA       HH Arranged: NA [place] Agency: NA        Prior Living Arrangements/Services Living arrangements for the past 2 months: Single Family Home (Mother in Museum/gallery curator on Son's property.) Lives with:: Adult Children Patient language and need for interpreter reviewed:: Yes Do you feel safe going back to the place where you live?: Yes      Need for Family Participation in Patient Care: Yes (Comment) Care giver support system in place?: Yes (comment)   Criminal Activity/Legal Involvement Pertinent to Current Situation/Hospitalization: No - Comment as needed  Activities of Daily Living Home Assistive Devices/Equipment: Eyeglasses ADL Screening (condition at time of  admission) Patient's cognitive ability adequate to safely complete daily activities?: Yes Is the patient deaf or have difficulty hearing?: Yes Does the patient have difficulty seeing, even when wearing glasses/contacts?: No Does the patient have difficulty concentrating, remembering, or making decisions?: No Patient able to express need for assistance with ADLs?: Yes Does the patient have difficulty dressing or bathing?: No Independently performs ADLs?: Yes (appropriate for developmental age) Does the patient have difficulty walking or climbing stairs?: No Weakness of Legs: None Weakness of Arms/Hands: None  Permission Sought/Granted Permission sought to share information with : Case Manager                Emotional Assessment Appearance:: Appears stated age Attitude/Demeanor/Rapport: Engaged,Self-Confident   Orientation: : Oriented to Self,Oriented to Place,Oriented to  Time,Oriented to Situation Alcohol / Substance Use: Not Applicable Psych Involvement: No (comment)  Admission diagnosis:  Acute respiratory failure with hypoxia ([place]) [J96.01] Chronic obstructive pulmonary disease with acute exacerbation (HCC) [J44.1] Acute on chronic systolic CHF (congestive heart failure) ([place]) [I50.23] Acute on chronic respiratory failure with hypoxia (HCC) [J96.21] Congestive heart failure, unspecified HF chronicity, unspecified heart failure type ([place]) [I50.9] Patient Active Problem List   Diagnosis Date Noted  . AKI (acute kidney injury) ([place]) 05/26/2020  . Aortic atherosclerosis ([place]) 05/25/2020  . Acute on chronic respiratory failure with hypoxia ([place]) 05/24/2020  . Acute on chronic combined systolic (congestive) and diastolic (congestive) heart failure ([place]) 05/24/2020  . COPD  exacerbation ([place]) 05/24/2020  . Diabetes mellitus without complication ([place]) 93/71/6967  . Acute CHF ([place]) 03/27/2020  . Acute CHF (congestive heart failure) ([place]) 03/27/2020  . Acute respiratory failure  with hypoxia ([place])   . Diabetes mellitus type 2, uncomplicated ([place]) 89/38/1017  . COPD, mild ([place]) 09/17/2014  . Clinical depression 09/17/2014  . Elevated platelet count 09/17/2014  . Elevated erythrocyte sedimentation rate 09/17/2014  . History of tobacco use 09/17/2014  . Hypercholesteremia 09/17/2014  . BP (high blood pressure) 09/17/2014  . Hypothyroidism 09/17/2014  . Cannot sleep 09/17/2014  . Lumbar disc disease with radiculopathy 09/17/2014  . Arthritis, degenerative 09/17/2014  . Excess weight 09/17/2014  . Vertebrobasilar circulation transient ischemic attack 09/17/2014  . Lung nodule, multiple 09/17/2014  . Basilar artery insufficiency 09/17/2014  . Neuritis or radiculitis due to rupture of lumbar intervertebral disc 08/25/2013  . Degenerative arthritis of lumbar spine 08/25/2013  . Lumbar canal stenosis 08/25/2013  . Smoker 07/15/2012  . Back pain 07/15/2012  . SOB (shortness of breath) 07/15/2012  . Hyperlipidemia 07/15/2012  . Essential hypertension 07/15/2012   PCP:  Jerrol Banana., MD Pharmacy:   [facility] [phone] [address], [place] AT [facility] OF [address] [place] [address] Phone: [phone] Fax: [phone]     Social Determinants of Health (SDOH) Interventions    Readmission Risk Interventions Readmission Risk Prevention Plan 05/27/2020  Transportation Screening Complete  PCP or Specialist Appt within 5-7 Days Complete  Home Care Screening Complete  Medication Review (RN CM) Complete  Some recent data might be hidden

## 2020-05-27 NOTE — Consult Note (Addendum)
Cardiology Consult    Patient ID: MACIEL KEGG MRN: [id number], DOB/AGE: Apr 08, 1933   Admit date: 05/24/2020 Date of Consult: 05/27/2020  Primary Physician: Jerrol Banana., MD Primary Cardiologist: Ida Rogue, MD Requesting Provider: Dr. Sarajane Jews  Patient Profile    Kathryn [name] is a 87 y.o. female with a history of chronic combined HF LVEF 40-45% with G2DD on Echo 03/28/20, COPD, HTN, HLD, LBBB, PSVT, diabetes, hypothyroid, PVD, gastric ulcer, who is being seen today for the evaluation of acute on chronic combined heart failure at the request of Dr. Sarajane Jews.  Past Medical History   Past Medical History:  Diagnosis Date  . Actinic keratosis   . Aortic atherosclerosis ([place]) 05/25/2020  . Arthritis    spinal stenosis  . Cardiomyopathy - presumed to be nonischemic    a. 03/2020 Echo: EF 40-45%, gr2 DD. Nl RV fxn. Mildly dil LA. Mild MR/ao sclerosis; b. 03/2020 MV: EF 30-44%, no ischemia. Cor Ca2+ in pLAD.  Marland Kitchen CHF (congestive heart failure) ([place])   . COPD (chronic obstructive pulmonary disease) ([place])   . Coronary artery calcification seen on CT scan    a. 03/2020 MV: CT attenuation images show Ao and mod pLAD Ca2+.  . Diabetes mellitus without complication ([place])   . Gastric ulcer 4/12   treated with Prolisec- states no problems now  . Hyperlipidemia   . Hypertension    PCP Dr Miguel Aschoff     . Hypothyroidism   . Left bundle branch block (LBBB)   . Neuromuscular disorder ([place])    slight numbness right toes- comes and goes  . Peripheral vascular disease ([facility])    varicose veins left leg  . Pneumonia   . PSVT (paroxysmal supraventricular tachycardia) ([place])    a. 03/2020 Zio: Avg HR 87 (66-211).  4 beats NSVT.  43 PSVT episodes, longest 10h 40m @ avg of 153 bpm (max 211).  Seen by EP-->amio started (pt wished to avoid EPS/RFCA).  . Seasonal allergies   . Shortness of breath   . Skin cancer    multiple from face  . Squamous cell carcinoma of skin  03/23/2013   L dorsal hand  . Squamous cell carcinoma of skin 02/04/2014   R forearm/in situ, L pretibial  . Squamous cell carcinoma of skin 09/24/2018   R thumb    Past Surgical History:  Procedure Laterality Date  . BACK SURGERY     4/12  Dr Shellia Carwin- for lumbar stenosis  . BREAST CYST ASPIRATION Left 1965   negative  . BREAST SURGERY  1962   lumpectomy with biopsy   left  . CARPAL TUNNEL RELEASE     right  . COLONOSCOPY    . COLONOSCOPY WITH PROPOFOL N/A 06/30/2015   Procedure: COLONOSCOPY WITH PROPOFOL;  Surgeon: Hulen Luster, MD;  Location: [facility] ENDOSCOPY;  Service: Gastroenterology;  Laterality: N/A;  . COLONOSCOPY WITH PROPOFOL N/A 01/22/2017   Procedure: COLONOSCOPY WITH PROPOFOL;  Surgeon: Lollie Sails, MD;  Location: [facility] ENDOSCOPY;  Service: Endoscopy;  Laterality: N/A;  . COLONOSCOPY WITH PROPOFOL N/A 01/28/2019   Procedure: COLONOSCOPY WITH PROPOFOL;  Surgeon: Toledo, Benay Pike, MD;  Location: [facility] ENDOSCOPY;  Service: Gastroenterology;  Laterality: N/A;  . ESOPHAGOGASTRODUODENOSCOPY    . ESOPHAGOGASTRODUODENOSCOPY (EGD) WITH PROPOFOL N/A 01/28/2019   Procedure: ESOPHAGOGASTRODUODENOSCOPY (EGD) WITH PROPOFOL;  Surgeon: Toledo, Benay Pike, MD;  Location: [facility] ENDOSCOPY;  Service: Gastroenterology;  Laterality: N/A;  . EYE SURGERY     cataract extraction with IOL bilaterally  .  JOINT REPLACEMENT    . LUMBAR LAMINECTOMY/DECOMPRESSION MICRODISCECTOMY  05/24/2011   Procedure: LUMBAR LAMINECTOMY/DECOMPRESSION MICRODISCECTOMY 2 LEVELS;  Surgeon: Magnus Sinning, MD;  Location: [facility] ORS;  Service: Orthopedics;  Laterality: N/A;  L2-L3, L3-L4 (x-ray)  . RIGHT OOPHORECTOMY     due to STAPH INFECTION     Allergies  Allergies  Allergen Reactions  . Oxycodone Other (See Comments)    Mental status change  . Prednisone     swelling, bruising.    History of Present Illness    Kathryn [name] is a 87 y.o. female with a history of chronic combined HFLVEF 40-45% with G2DD on  Echo 03/28/20 , COPD, previous tobacco use, HTN, HLD, LBBB, PSVT, diabetes, hypothyroid, PVD, gastric ulcer, who is being seen today for the evaluation of acute on chronic combined heart failure at the request of Dr. Sarajane Jews.  She was admitted with new HF 03/26/20, echo on 03/28/20 showed EF 40-45% along w/mild LAE, mild MR/AR. Short runs of atrial tachycardia (? Afib per notes) were noted on telemetry with overall burden low during her admission. Due to the question of PAF and the short duration of the episodes, it was decided to defer anticoagulation until placement of 14-day monitor was completed as an outpatient.  The monitor revealed NSR w/1 run of VT lasting 4 beats, 43 runs of SVT w/one episode lasting 10 hrs 35 mins, and premature contractions. Patient triggered events were not associated w/significant arrhythmia. She was seen by EP, Dr. Quentin Ore on Feb. 16,  2022 and started on amiodarone. She does not wish to proceed with ablation at this time.   She was seen on 2/21 in HF clinic with 3 lb weight gain from the previous month. She was determined to be stable and was advised to schedule follow-up with HF Clinic as needed, and to continue to see primary cardiologist. She has been managed on lasix 20 mg daily with permission to take extra lasix 20 mg for edema.  Her home wts have been trending ~ 174-176 lbs, and she has only taken an extra lasix 3x - generally either when noting mild ankle edema or feeling upper abd fullness.  She admits to "loving salt" and eating a high sodium diet but has been working on reducing sodium. She does not wear home O2 but does a daily nebulizer treatment. She quit smoking 12 years ago and states she has COPD and had chronic bronchitis prior to quitting smoking.   On the evening of 2/28, she felt well and after going to bed, she awoke in the early morning hours of 3/1 feeling dyspneic.  She sat up for a little while and symptoms resolved, so she went back to bed and fell off to  sleep.  Unfortunately, sometime after 3a, she awoke again, and was even more dyspneic.  She again sat up, but this time did not get relief.  She called her son and he called EMS.  Upon EMS arrival, she was found to be hypoxic with O2 sats in the 80's on room air upon arrival.  She was transported to the ED and imaging revealed no PE, bilateral pleural effusions w/associated dependent airspace disease, and evidence of CHF. She was admitted for further evaluation and diuresis.  On IV lasix, she is minus 3.1L for this admission.  Creat bumped to 1.59 on 3/3 and lasix was held. Creat 1.12 this AM.  Wt 176 lbs.  ReDS vest reading on high end of nl @ 35.  Still  w/ fine basilar crackles on exam, and so cardiology consulted to assist w/ mgmt.  She has been ambulating around the unit w/ PT and feels well - eager to go home.  Inpatient Medications    . amiodarone  200 mg Oral Daily  . aspirin EC  81 mg Oral Daily  . atorvastatin  40 mg Oral Daily  . azithromycin  250 mg Oral Daily  . carvedilol  6.25 mg Oral BID WC  . enoxaparin (LOVENOX) injection  40 mg Subcutaneous Q24H  . insulin aspart  0-5 Units Subcutaneous QHS  . insulin aspart  0-9 Units Subcutaneous TID WC  . insulin aspart  2 Units Subcutaneous TID WC  . ipratropium-albuterol  3 mL Nebulization Daily  . levothyroxine  100 mcg Oral Q0600  . pantoprazole  40 mg Oral Daily  . sodium chloride flush  3 mL Intravenous Q12H  . torsemide  20 mg Oral Daily    Family History    Family History  Problem Relation Age of Onset  . Dementia Mother   . Lung cancer Father   . Heart disease Father   . Hypertension Sister   . Breast cancer Maternal Aunt   . Arthritis Son    She indicated that her mother is deceased. She indicated that her father is deceased. She indicated that both of her sisters are alive. She indicated that her son is alive. She indicated that the status of her maternal aunt is unknown.   Social History    Social History    Socioeconomic History  . Marital status: Widowed    Spouse name: Not on file  . Number of children: Not on file  . Years of education: Not on file  . Highest education level: Not on file  Occupational History  . Not on file  Tobacco Use  . Smoking status: Former Smoker    Packs/day: 0.50    Years: 50.00    Pack years: 25.00    Types: Cigarettes    Quit date: 05/16/2004    Years since quitting: 16.0  . Smokeless tobacco: Never Used  Vaping Use  . Vaping Use: Never used  Substance and Sexual Activity  . Alcohol use: Yes    Comment: socially-1 glass of wine or [place] icea tea once a month  . Drug use: No  . Sexual activity: Yes    Birth control/protection: None  Other Topics Concern  . Not on file  Social History Narrative  . Not on file   Social Determinants of Health   Financial Resource Strain: Not on file  Food Insecurity: Not on file  Transportation Needs: Not on file  Physical Activity: Not on file  Stress: Not on file  Social Connections: Not on file  Intimate Partner Violence: Not on file     Review of Systems    General:  No chills, fever, night sweats or weight changes.  Cardiovascular:  No chest pain, +++ occas edema, no palpitations, syncope. No dizziness/lightheadedness. Stable orthopnea w/large pillow at home. Chronic dyspnea that worsened early morning 3/1.  Dermatological: No rash, lesions/masses Respiratory: Chronic dyspnea, productive cough since admission, change from chronic "dry hacking cough" Urologic: No hematuria, dysuria Abdominal:   No nausea, vomiting, diarrhea, bright red blood per rectum, melena, or hematemesis Neurologic:  No visual changes, wkns, changes in mental status. All other systems reviewed and are otherwise negative except as noted above.  Physical Exam    Blood pressure 119/71, pulse 69, temperature 98 F (  36.7 C), temperature source Oral, resp. rate 16, height 5\' 4"  (1.626 m), weight 79.4 kg, SpO2 99 %.  General:  Pleasant, NAD Psych: Normal affect. Neuro: Alert and oriented X 3. Moves all extremities spontaneously. HEENT: Normal  Neck: Supple without bruits or JVD. Lungs:  Resp regular and unlabored, fine crackles @ bilat bases. Mild inspiratory/exp wheezing noted bilaterally.  Heart: RRR no s3, s4. Soft, distant, 1/6 systolic murmur heard at RUSB. Abdomen: Obese, soft, non-tender, non-distended, BS + x 4.  Extremities: No clubbing, cyanosis or edema. DP/PT2+, Radials 2+ and equal bilaterally.  Labs    Cardiac Enzymes Recent Labs  Lab 05/24/20 0746  TROPONINIHS 10      Lab Results  Component Value Date   WBC 13.2 (H) 05/25/2020   HGB 11.6 (L) 05/25/2020   HCT 37.6 05/25/2020   MCV 81.6 05/25/2020   PLT 305 05/25/2020    Recent Labs  Lab 05/24/20 0746 05/25/20 0617 05/27/20 0612  NA 138   < > 138  K 4.5   < > 4.4  CL 105   < > 104  CO2 23   < > 27  BUN 28*   < > 44*  CREATININE 0.91   < > 1.12*  CALCIUM 9.8   < > 9.0  PROT 6.6  --   --   BILITOT 0.8  --   --   ALKPHOS 73  --   --   ALT 20  --   --   AST 22  --   --   GLUCOSE 297*   < > 141*   < > = values in this interval not displayed.   Lab Results  Component Value Date   CHOL 294 (H) 04/13/2020   HDL 50 04/13/2020   LDLCALC 196 (H) 04/13/2020   TRIG 242 (H) 04/13/2020   Lab Results  Component Value Date   DDIMER 1.01 (H) 05/24/2020     Radiology Studies    CT Angio Chest PE W and/or Wo Contrast  Result Date: 05/24/2020 CLINICAL DATA:  PE suspected, high probability. Shortness of breath for 3 days. Progressive symptoms this morning. EXAM: CT ANGIOGRAPHY CHEST WITH CONTRAST TECHNIQUE: Multidetector CT imaging of the chest was performed using the standard protocol during bolus administration of intravenous contrast. Multiplanar CT image reconstructions and MIPs were obtained to evaluate the vascular anatomy. CONTRAST:  55mL OMNIPAQUE IOHEXOL 350 MG/ML SOLN COMPARISON:  One-view chest x-ray 05/24/2020. CT chest with  contrast 10/22/2012 FINDINGS: Cardiovascular: Heart size is normal. Atherosclerotic calcifications are present at the aortic arch great vessel origins. No aneurysm is present. Atherosclerotic calcifications are present in the coronary arteries. Pulmonary artery opacification is excellent. No focal filling defects are present to suggest pulmonary emboli. Study is mildly degraded by breathing motion. Pulmonary artery size is normal. Mediastinum/Nodes: Subcentimeter paratracheal and prevascular lymph nodes are likely reactive. Is slightly larger, but were present on the prior study. The esophagus is within normal limits. Thoracic inlet is unremarkable. Lungs/Pleura: Bilateral pleural effusions are present. Dependent airspace disease is associated. No other focal nodule, mass, or airspace disease is present. Airways are patent. Breathing motion is noted. Upper Abdomen: Visualized upper abdomen is remarkable for atherosclerotic change. No solid organ lesions are present. Musculoskeletal: Vertebral body heights and alignment are normal. No significant listhesis is present. Ribs are within normal limits bilaterally. Advanced degenerative changes are noted at both shoulders. Review of the MIP images confirms the above findings. IMPRESSION: 1. No pulmonary embolus. 2. Bilateral pleural  effusions with associated dependent airspace disease. Findings are consistent with congestive heart failure. Infection is considered less likely. 3. Coronary artery disease. 4. Aortic Atherosclerosis (ICD10-I70.0). Electronically Signed   By: San Morelle M.D.   On: 05/24/2020 11:30   US Venous Img Lower Bilateral (DVT)  Result Date: 05/25/2020 CLINICAL DATA:  Positive D-dimer, varicose veins EXAM: BILATERAL LOWER EXTREMITY VENOUS DOPPLER ULTRASOUND TECHNIQUE: Gray-scale sonography with compression, as well as color and duplex ultrasound, were performed to evaluate the deep venous system(s) from the level of the common femoral vein  through the popliteal and proximal calf veins. COMPARISON:  None. FINDINGS: VENOUS Normal compressibility of the common femoral, superficial femoral, and popliteal veins, as well as the visualized calf veins. Visualized portions of profunda femoral vein and great saphenous vein unremarkable. No filling defects to suggest DVT on grayscale or color Doppler imaging. Doppler waveforms show normal direction of venous flow, normal respiratory phasicity and response to augmentation. OTHER None. Limitations: none IMPRESSION: No femoropopliteal DVT nor evidence of DVT within the visualized calf veins. If clinical symptoms are inconsistent or if there are persistent or worsening symptoms, further imaging (possibly involving the iliac veins) may be warranted. Electronically Signed   By: Lucrezia Europe M.D.   On: 05/25/2020 10:19   DG Chest Port 1 View  Result Date: 05/24/2020 CLINICAL DATA:  Possible sepsis Shortness of breath for 3 days EXAM: PORTABLE CHEST 1 VIEW COMPARISON:  03/26/2020 FINDINGS: Heart size at upper limits of normal. Mild pulmonary vascular congestion again seen. There has been interval improvement in aeration of the lungs, however focal opacity still remains at the right lung base suspicious for pneumonia. Advanced degenerative changes of the glenohumeral joints bilaterally. IMPRESSION: Patchy residual opacity at the right lung base suspicious for pneumonia. Electronically Signed   By: Miachel Roux M.D.   On: 05/24/2020 08:20    ECG & Cardiac Imaging    RSR @ 78 bpm - occasional PAC -  personally reviewed.  Assessment & Plan    1. Acute on chronic combined HF: LVEF 40-45% with G2DD on Echo 03/28/20. Recent increase in ankle edema and dyspnea at home. She reports an element of chronic dyspnea and stable orthopnea but was awakened early in the morning on 3/1 with severe SOB that did not resolve with sitting up right. She was found to be hypoxic with O2 sats 80's on RA. ReDS Vest @ high end of nl @ 35.   She is euvolemic on exam and is Neg 3 L since admission. She admits to consuming high sodium diet. Discussed ways to reduce sodium in her diet and advised her to follow a consistent routine of daily weights. She has been taking extra lasix prn for ankle swelling. Given presentation despite compliance w/ lasix @ home and report of abd bloating w/ weight increases, will change diuretic regimen to torsdemide 20 mg daily (first dose today) w/ an additional 20mg  for weight increase of 2 lb in 24 hours or 5 lbs in one week. Continue ARB/ blocker. She has follow-up scheduled with HF clinic on 3/11.   2. PSVT: Recently noted on event monitoring.  She is on amiodarone and tolerating without adverse effect. She is followed by Dr. Quentin Ore, EP. ECG today shows RSR with occasional PAC. Also on carvedilol. Has follow-up with Dr. Quentin Ore scheduled for May. Will continue current treatment.    3. AKI: SCr bumped to 1.59 during hospitalization, better today at 1.12. Lasix has been held since yesterday, changing to torsemide  today. She was also on vancomycin for bacteremia since admission - this has been stopped and repeat blood cultures are pending. Will plan to recheck bmet at appointment with HF Clinic on 3/11.   4. Essential hypertension: She reports BP has been very stable at home for as long as she can remember. Continue low dose ARB, carvedilol, diuretic.   5. Aortic atherosclerosis/Hyperlipidemia: Aortic atherosclerosis seen on imaging. She is on atorvastatin. Last LDL 196 Jan 2022 with plan to repeat since initiation of statin therapy. She denies any symptoms of chest discomfort but does have chronic dyspnea. Recent neg MV.  6. COPD: She reports that she is breathing better today. She is on room air. She recently started home nebulizer treatments. Management per IM.     Signed, Murray Hodgkins, NP 05/27/2020, 2:05 PM  For questions or updates, please contact   Please consult [email or web address] for contact info  under Cardiology/STEMI.

## 2020-05-30 LAB — CULTURE, BLOOD (ROUTINE X 2)
Culture: NO GROWTH
Culture: NO GROWTH
Special Requests: ADEQUATE

## 2020-05-31 ENCOUNTER — Other Ambulatory Visit: Payer: Self-pay | Admitting: Family Medicine

## 2020-06-03 ENCOUNTER — Ambulatory Visit: Payer: Medicare Other | Admitting: Family

## 2020-06-06 ENCOUNTER — Inpatient Hospital Stay
Admission: EM | Admit: 2020-06-06 | Discharge: 2020-06-08 | DRG: 291 | Disposition: A | Discharge status: Home / Self Care | Payer: Medicare Other | Attending: Internal Medicine | Admitting: Internal Medicine

## 2020-06-06 ENCOUNTER — Other Ambulatory Visit: Payer: Self-pay

## 2020-06-06 ENCOUNTER — Emergency Department: Payer: Medicare Other

## 2020-06-06 ENCOUNTER — Encounter: Payer: Self-pay | Admitting: Intensive Care

## 2020-06-06 DIAGNOSIS — Z888 Allergy status to other drugs, medicaments and biological substances status: Secondary | ICD-10-CM

## 2020-06-06 DIAGNOSIS — Z803 Family history of malignant neoplasm of breast: Secondary | ICD-10-CM

## 2020-06-06 DIAGNOSIS — E1151 Type 2 diabetes mellitus with diabetic peripheral angiopathy without gangrene: Secondary | ICD-10-CM | POA: Diagnosis present

## 2020-06-06 DIAGNOSIS — M1990 Unspecified osteoarthritis, unspecified site: Secondary | ICD-10-CM | POA: Diagnosis present

## 2020-06-06 DIAGNOSIS — E119 Type 2 diabetes mellitus without complications: Secondary | ICD-10-CM

## 2020-06-06 DIAGNOSIS — I700 Atherosclerosis of aorta: Secondary | ICD-10-CM | POA: Diagnosis present

## 2020-06-06 DIAGNOSIS — R069 Unspecified abnormalities of breathing: Secondary | ICD-10-CM | POA: Diagnosis not present

## 2020-06-06 DIAGNOSIS — Z87891 Personal history of nicotine dependence: Secondary | ICD-10-CM

## 2020-06-06 DIAGNOSIS — J441 Chronic obstructive pulmonary disease with (acute) exacerbation: Secondary | ICD-10-CM | POA: Diagnosis present

## 2020-06-06 DIAGNOSIS — Z85828 Personal history of other malignant neoplasm of skin: Secondary | ICD-10-CM | POA: Diagnosis not present

## 2020-06-06 DIAGNOSIS — I10 Essential (primary) hypertension: Secondary | ICD-10-CM | POA: Diagnosis not present

## 2020-06-06 DIAGNOSIS — Z885 Allergy status to narcotic agent status: Secondary | ICD-10-CM

## 2020-06-06 DIAGNOSIS — R062 Wheezing: Secondary | ICD-10-CM | POA: Diagnosis not present

## 2020-06-06 DIAGNOSIS — J189 Pneumonia, unspecified organism: Secondary | ICD-10-CM | POA: Diagnosis present

## 2020-06-06 DIAGNOSIS — I110 Hypertensive heart disease with heart failure: Secondary | ICD-10-CM | POA: Diagnosis not present

## 2020-06-06 DIAGNOSIS — E039 Hypothyroidism, unspecified: Secondary | ICD-10-CM | POA: Diagnosis not present

## 2020-06-06 DIAGNOSIS — Z8249 Family history of ischemic heart disease and other diseases of the circulatory system: Secondary | ICD-10-CM

## 2020-06-06 DIAGNOSIS — I428 Other cardiomyopathies: Secondary | ICD-10-CM | POA: Diagnosis not present

## 2020-06-06 DIAGNOSIS — I509 Heart failure, unspecified: Secondary | ICD-10-CM

## 2020-06-06 DIAGNOSIS — R0602 Shortness of breath: Secondary | ICD-10-CM | POA: Diagnosis not present

## 2020-06-06 DIAGNOSIS — J440 Chronic obstructive pulmonary disease with acute lower respiratory infection: Secondary | ICD-10-CM | POA: Diagnosis present

## 2020-06-06 DIAGNOSIS — Z801 Family history of malignant neoplasm of trachea, bronchus and lung: Secondary | ICD-10-CM

## 2020-06-06 DIAGNOSIS — Z7984 Long term (current) use of oral hypoglycemic drugs: Secondary | ICD-10-CM

## 2020-06-06 DIAGNOSIS — I447 Left bundle-branch block, unspecified: Secondary | ICD-10-CM | POA: Diagnosis not present

## 2020-06-06 DIAGNOSIS — Z20822 Contact with and (suspected) exposure to covid-19: Secondary | ICD-10-CM | POA: Diagnosis present

## 2020-06-06 DIAGNOSIS — Z79899 Other long term (current) drug therapy: Secondary | ICD-10-CM | POA: Diagnosis not present

## 2020-06-06 DIAGNOSIS — Z7982 Long term (current) use of aspirin: Secondary | ICD-10-CM

## 2020-06-06 DIAGNOSIS — I2510 Atherosclerotic heart disease of native coronary artery without angina pectoris: Secondary | ICD-10-CM | POA: Diagnosis present

## 2020-06-06 DIAGNOSIS — J90 Pleural effusion, not elsewhere classified: Secondary | ICD-10-CM | POA: Diagnosis not present

## 2020-06-06 DIAGNOSIS — E785 Hyperlipidemia, unspecified: Secondary | ICD-10-CM | POA: Diagnosis not present

## 2020-06-06 DIAGNOSIS — Z01810 Encounter for preprocedural cardiovascular examination: Secondary | ICD-10-CM | POA: Diagnosis not present

## 2020-06-06 DIAGNOSIS — I471 Supraventricular tachycardia: Secondary | ICD-10-CM | POA: Diagnosis not present

## 2020-06-06 DIAGNOSIS — Z79890 Hormone replacement therapy (postmenopausal): Secondary | ICD-10-CM

## 2020-06-06 DIAGNOSIS — E1165 Type 2 diabetes mellitus with hyperglycemia: Secondary | ICD-10-CM | POA: Diagnosis not present

## 2020-06-06 DIAGNOSIS — Z66 Do not resuscitate: Secondary | ICD-10-CM | POA: Diagnosis not present

## 2020-06-06 DIAGNOSIS — J9621 Acute and chronic respiratory failure with hypoxia: Secondary | ICD-10-CM | POA: Diagnosis not present

## 2020-06-06 DIAGNOSIS — I5043 Acute on chronic combined systolic (congestive) and diastolic (congestive) heart failure: Secondary | ICD-10-CM | POA: Diagnosis present

## 2020-06-06 DIAGNOSIS — J449 Chronic obstructive pulmonary disease, unspecified: Secondary | ICD-10-CM | POA: Diagnosis not present

## 2020-06-06 DIAGNOSIS — E7800 Pure hypercholesterolemia, unspecified: Secondary | ICD-10-CM | POA: Diagnosis present

## 2020-06-06 DIAGNOSIS — Z9660 Presence of unspecified orthopedic joint implant: Secondary | ICD-10-CM | POA: Diagnosis not present

## 2020-06-06 LAB — CBC WITH DIFFERENTIAL/PLATELET
Abs Immature Granulocytes: 0.07 10*3/uL (ref 0.00–0.07)
Basophils Absolute: 0.1 10*3/uL (ref 0.0–0.1)
Basophils Relative: 1 %
Eosinophils Absolute: 0.2 10*3/uL (ref 0.0–0.5)
Eosinophils Relative: 2 %
HCT: 40.2 % (ref 36.0–46.0)
Hemoglobin: 11.7 g/dL — ABNORMAL LOW (ref 12.0–15.0)
Immature Granulocytes: 1 %
Lymphocytes Relative: 15 %
Lymphs Abs: 1.3 10*3/uL (ref 0.7–4.0)
MCH: 24.8 pg — ABNORMAL LOW (ref 26.0–34.0)
MCHC: 29.1 g/dL — ABNORMAL LOW (ref 30.0–36.0)
MCV: 85.2 fL (ref 80.0–100.0)
Monocytes Absolute: 0.5 10*3/uL (ref 0.1–1.0)
Monocytes Relative: 6 %
Neutro Abs: 6.5 10*3/uL (ref 1.7–7.7)
Neutrophils Relative %: 75 %
Platelets: 308 10*3/uL (ref 150–400)
RBC: 4.72 MIL/uL (ref 3.87–5.11)
RDW: 15.5 % (ref 11.5–15.5)
WBC: 8.7 10*3/uL (ref 4.0–10.5)
nRBC: 0 % (ref 0.0–0.2)

## 2020-06-06 LAB — COMPREHENSIVE METABOLIC PANEL
ALT: 17 U/L (ref 0–44)
AST: 19 U/L (ref 15–41)
Albumin: 4 g/dL (ref 3.5–5.0)
Alkaline Phosphatase: 66 U/L (ref 38–126)
Anion gap: 8 (ref 5–15)
BUN: 20 mg/dL (ref 8–23)
CO2: 26 mmol/L (ref 22–32)
Calcium: 9.3 mg/dL (ref 8.9–10.3)
Chloride: 106 mmol/L (ref 98–111)
Creatinine, Ser: 0.91 mg/dL (ref 0.44–1.00)
GFR, Estimated: >60 mL/min (ref >60)
Glucose, Bld: 228 mg/dL — ABNORMAL HIGH (ref 70–99)
Potassium: 4.2 mmol/L (ref 3.5–5.1)
Sodium: 140 mmol/L (ref 135–145)
Total Bilirubin: 0.7 mg/dL (ref 0.3–1.2)
Total Protein: 7.2 g/dL (ref 6.5–8.1)

## 2020-06-06 LAB — CBG MONITORING, ED: Glucose-Capillary: 123 mg/dL — ABNORMAL HIGH (ref 70–99)

## 2020-06-06 LAB — TROPONIN I (HIGH SENSITIVITY): Troponin I (High Sensitivity): 13 ng/L (ref <18)

## 2020-06-06 LAB — GLUCOSE, CAPILLARY: Glucose-Capillary: 121 mg/dL — ABNORMAL HIGH (ref 70–99)

## 2020-06-06 LAB — BRAIN NATRIURETIC PEPTIDE: B Natriuretic Peptide: 361.8 pg/mL — ABNORMAL HIGH (ref 0.0–100.0)

## 2020-06-06 MED ORDER — ALBUTEROL SULFATE (2.5 MG/3ML) 0.083% IN NEBU: 2.5000 mg | INHALATION_SOLUTION | RESPIRATORY_TRACT | Status: DC | PRN

## 2020-06-06 MED ORDER — IPRATROPIUM-ALBUTEROL 0.5-2.5 (3) MG/3ML IN SOLN
3.0000 mL | Freq: Once | RESPIRATORY_TRACT | Status: AC
  Administered 2020-06-06: 3 mL via RESPIRATORY_TRACT
  Filled 2020-06-06: qty 3

## 2020-06-06 MED ORDER — ONDANSETRON HCL 4 MG/2ML IJ SOLN: 4.0000 mg | Freq: Three times a day (TID) | INTRAMUSCULAR | Status: DC | PRN

## 2020-06-06 MED ORDER — SODIUM CHLORIDE 0.9 % IV SOLN: 250.0000 mL | INTRAVENOUS | Status: DC | PRN

## 2020-06-06 MED ORDER — ACETAMINOPHEN 325 MG PO TABS: 650.0000 mg | ORAL_TABLET | Freq: Four times a day (QID) | ORAL | Status: DC | PRN

## 2020-06-06 MED ORDER — IPRATROPIUM-ALBUTEROL 0.5-2.5 (3) MG/3ML IN SOLN
3.0000 mL | RESPIRATORY_TRACT | Status: DC
  Administered 2020-06-06 – 2020-06-07 (×2): 3 mL via RESPIRATORY_TRACT
  Filled 2020-06-06 (×2): qty 3

## 2020-06-06 MED ORDER — INSULIN ASPART 100 UNIT/ML ~~LOC~~ SOLN
0.0000 [IU] | Freq: Three times a day (TID) | SUBCUTANEOUS | Status: DC
  Administered 2020-06-06: 1 [IU] via SUBCUTANEOUS
  Administered 2020-06-07: 2 [IU] via SUBCUTANEOUS
  Administered 2020-06-07: 1 [IU] via SUBCUTANEOUS
  Administered 2020-06-07: 2 [IU] via SUBCUTANEOUS
  Administered 2020-06-08: 5 [IU] via SUBCUTANEOUS
  Filled 2020-06-06 (×5): qty 1

## 2020-06-06 MED ORDER — PANTOPRAZOLE SODIUM 40 MG PO TBEC
40.0000 mg | DELAYED_RELEASE_TABLET | Freq: Every day | ORAL | Status: DC
  Administered 2020-06-07 – 2020-06-08 (×2): 40 mg via ORAL
  Filled 2020-06-06 (×2): qty 1

## 2020-06-06 MED ORDER — SODIUM CHLORIDE 0.9% FLUSH
3.0000 mL | Freq: Two times a day (BID) | INTRAVENOUS | Status: DC
  Administered 2020-06-06 – 2020-06-07 (×4): 3 mL via INTRAVENOUS

## 2020-06-06 MED ORDER — FUROSEMIDE 10 MG/ML IJ SOLN
40.0000 mg | Freq: Two times a day (BID) | INTRAMUSCULAR | Status: DC
  Administered 2020-06-06 – 2020-06-07 (×2): 40 mg via INTRAVENOUS
  Filled 2020-06-06 (×2): qty 4

## 2020-06-06 MED ORDER — ASPIRIN EC 81 MG PO TBEC: 81.0000 mg | DELAYED_RELEASE_TABLET | Freq: Every day | ORAL | Status: DC

## 2020-06-06 MED ORDER — AMIODARONE HCL 200 MG PO TABS
200.0000 mg | ORAL_TABLET | Freq: Every day | ORAL | Status: DC
  Administered 2020-06-07 – 2020-06-08 (×2): 200 mg via ORAL
  Filled 2020-06-06 (×2): qty 1

## 2020-06-06 MED ORDER — PANTOPRAZOLE SODIUM 40 MG PO TBEC: 40.0000 mg | DELAYED_RELEASE_TABLET | Freq: Every day | ORAL | Status: DC

## 2020-06-06 MED ORDER — HYDROXYZINE HCL 10 MG PO TABS
10.0000 mg | ORAL_TABLET | Freq: Four times a day (QID) | ORAL | Status: DC | PRN
  Administered 2020-06-07 (×3): 10 mg via ORAL
  Filled 2020-06-06 (×6): qty 1

## 2020-06-06 MED ORDER — FUROSEMIDE 10 MG/ML IJ SOLN
40.0000 mg | Freq: Once | INTRAMUSCULAR | Status: AC
  Administered 2020-06-06: 40 mg via INTRAVENOUS
  Filled 2020-06-06: qty 4

## 2020-06-06 MED ORDER — ASPIRIN EC 81 MG PO TBEC
81.0000 mg | DELAYED_RELEASE_TABLET | Freq: Every day | ORAL | Status: DC
  Administered 2020-06-06 – 2020-06-08 (×3): 81 mg via ORAL
  Filled 2020-06-06 (×3): qty 1

## 2020-06-06 MED ORDER — HYDRALAZINE HCL 20 MG/ML IJ SOLN: 5.0000 mg | INTRAMUSCULAR | Status: DC | PRN

## 2020-06-06 MED ORDER — DM-GUAIFENESIN ER 30-600 MG PO TB12
1.0000 | ORAL_TABLET | Freq: Two times a day (BID) | ORAL | Status: DC | PRN
  Administered 2020-06-07: 1 via ORAL
  Filled 2020-06-06: qty 1

## 2020-06-06 MED ORDER — SODIUM CHLORIDE 0.9% FLUSH: 3.0000 mL | INTRAVENOUS | Status: DC | PRN

## 2020-06-06 MED ORDER — ENOXAPARIN SODIUM 40 MG/0.4ML ~~LOC~~ SOLN
40.0000 mg | SUBCUTANEOUS | Status: DC
  Administered 2020-06-06 – 2020-06-07 (×2): 40 mg via SUBCUTANEOUS
  Filled 2020-06-06 (×2): qty 0.4

## 2020-06-06 MED ORDER — ATORVASTATIN CALCIUM 20 MG PO TABS
40.0000 mg | ORAL_TABLET | Freq: Every day | ORAL | Status: DC
  Administered 2020-06-07 – 2020-06-08 (×2): 40 mg via ORAL
  Filled 2020-06-06 (×2): qty 2

## 2020-06-06 MED ORDER — INSULIN ASPART 100 UNIT/ML ~~LOC~~ SOLN: 0.0000 [IU] | Freq: Every day | SUBCUTANEOUS | Status: DC

## 2020-06-06 MED ORDER — LOSARTAN POTASSIUM 25 MG PO TABS
25.0000 mg | ORAL_TABLET | Freq: Every evening | ORAL | Status: DC
  Administered 2020-06-06 – 2020-06-07 (×2): 25 mg via ORAL
  Filled 2020-06-06 (×2): qty 1

## 2020-06-06 MED ORDER — CARVEDILOL 6.25 MG PO TABS
6.2500 mg | ORAL_TABLET | Freq: Two times a day (BID) | ORAL | Status: DC
  Administered 2020-06-07 – 2020-06-08 (×3): 6.25 mg via ORAL
  Filled 2020-06-06 (×3): qty 1

## 2020-06-06 MED ORDER — LEVOTHYROXINE SODIUM 100 MCG PO TABS
100.0000 ug | ORAL_TABLET | Freq: Every day | ORAL | Status: DC
  Administered 2020-06-07 – 2020-06-08 (×2): 100 ug via ORAL
  Filled 2020-06-06 (×2): qty 1

## 2020-06-06 NOTE — Plan of Care (Signed)
  Problem: Clinical Measurements: Goal: Ability to maintain clinical measurements within normal limits will improve Outcome: Progressing   Problem: Clinical Measurements: Goal: Will remain free from infection Outcome: Progressing   Problem: Clinical Measurements: Goal: Diagnostic test results will improve Outcome: Progressing   Problem: Activity: Goal: Risk for activity intolerance will decrease Outcome: Progressing   Problem: Nutrition: Goal: Adequate nutrition will be maintained Outcome: Progressing   Problem: Coping: Goal: Level of anxiety will decrease Outcome: Progressing   Problem: Pain Managment: Goal: General experience of comfort will improve Outcome: Progressing

## 2020-06-06 NOTE — ED Provider Notes (Signed)
[facility] Emergency Department Provider Note   ____________________________________________    I have reviewed the triage vital signs and the nursing notes.   HISTORY  Chief Complaint Shortness of Breath     HPI Kathryn [name] is a 87 y.o. female with history of CHF, COPD, hypertension, diabetes who presents with complaints of shortness of breath.  Patient reports this morning she woke up feeling well overall, but then started to feel short of breath.  She reports sometimes this happened and she is able to "breathe through it and it gets better however today she was unsuccessful.  She did use albuterol with some improvement.  EMS gave an additional albuterol nebulizer which seemed to help significantly.  She reports she feels better now but still with some increased work of breathing.  Denies chest pain.  No calf pain or swelling.  Reports her weight was within range today.  No fevers or chills   Past Medical History:  Diagnosis Date  . Actinic keratosis   . Aortic atherosclerosis ([place]) 05/25/2020  . Arthritis    spinal stenosis  . Cardiomyopathy - presumed to be nonischemic    a. 03/2020 Echo: EF 40-45%, gr2 DD. Nl RV fxn. Mildly dil LA. Mild MR/ao sclerosis; b. 03/2020 MV: EF 30-44%, no ischemia. Cor Ca2+ in pLAD.  Marland Kitchen CHF (congestive heart failure) ([place])   . COPD (chronic obstructive pulmonary disease) ([place])   . Coronary artery calcification seen on CT scan    a. 03/2020 MV: CT attenuation images show Ao and mod pLAD Ca2+.  . Diabetes mellitus without complication ([place])   . Gastric ulcer 4/12   treated with Prolisec- states no problems now  . Hyperlipidemia   . Hypertension    PCP Dr Miguel Aschoff   [place]  . Hypothyroidism   . Left bundle branch block (LBBB)   . Neuromuscular disorder ([place])    slight numbness right toes- comes and goes  . Peripheral vascular disease (HCC)    varicose veins left leg  . Pneumonia   . PSVT (paroxysmal  supraventricular tachycardia) ([place])    a. 03/2020 Zio: Avg HR 87 (66-211).  4 beats NSVT.  43 PSVT episodes, longest 10h 50m @ avg of 153 bpm (max 211).  Seen by EP-->amio started (pt wished to avoid EPS/RFCA).  . Seasonal allergies   . Shortness of breath   . Skin cancer    multiple from face  . Squamous cell carcinoma of skin 03/23/2013   L dorsal hand  . Squamous cell carcinoma of skin 02/04/2014   R forearm/in situ, L pretibial  . Squamous cell carcinoma of skin 09/24/2018   R thumb    Patient Active Problem List   Diagnosis Date Noted  . AKI (acute kidney injury) ([place]) 05/26/2020  . Aortic atherosclerosis ([place]) 05/25/2020  . Acute on chronic respiratory failure with hypoxia ([place]) 05/24/2020  . Acute on chronic combined systolic (congestive) and diastolic (congestive) heart failure ([place]) 05/24/2020  . COPD exacerbation ([place]) 05/24/2020  . Diabetes mellitus without complication ([place]) 78/29/5621  . Acute CHF ([place]) 03/27/2020  . Acute CHF (congestive heart failure) ([place]) 03/27/2020  . Acute respiratory failure with hypoxia ([place])   . Diabetes mellitus type 2, uncomplicated ([place]) 30/86/5784  . COPD, mild ([place]) 09/17/2014  . Clinical depression 09/17/2014  . Elevated platelet count 09/17/2014  . Elevated erythrocyte sedimentation rate 09/17/2014  . History of tobacco use 09/17/2014  . Hypercholesteremia 09/17/2014  . BP (high blood pressure)  09/17/2014  . Hypothyroidism 09/17/2014  . Cannot sleep 09/17/2014  . Lumbar disc disease with radiculopathy 09/17/2014  . Arthritis, degenerative 09/17/2014  . Excess weight 09/17/2014  . Vertebrobasilar circulation transient ischemic attack 09/17/2014  . Lung nodule, multiple 09/17/2014  . Basilar artery insufficiency 09/17/2014  . Neuritis or radiculitis due to rupture of lumbar intervertebral disc 08/25/2013  . Degenerative arthritis of lumbar spine 08/25/2013  . Lumbar canal stenosis 08/25/2013  . Smoker 07/15/2012  . Back pain  07/15/2012  . SOB (shortness of breath) 07/15/2012  . Hyperlipidemia 07/15/2012  . Essential hypertension 07/15/2012    Past Surgical History:  Procedure Laterality Date  . BACK SURGERY     4/12  Dr Shellia Carwin- for lumbar stenosis  . BREAST CYST ASPIRATION Left 1965   negative  . BREAST SURGERY  1962   lumpectomy with biopsy   left  . CARPAL TUNNEL RELEASE     right  . COLONOSCOPY    . COLONOSCOPY WITH PROPOFOL N/A 06/30/2015   Procedure: COLONOSCOPY WITH PROPOFOL;  Surgeon: Hulen Luster, MD;  Location: [facility] ENDOSCOPY;  Service: Gastroenterology;  Laterality: N/A;  . COLONOSCOPY WITH PROPOFOL N/A 01/22/2017   Procedure: COLONOSCOPY WITH PROPOFOL;  Surgeon: Lollie Sails, MD;  Location: [facility] ENDOSCOPY;  Service: Endoscopy;  Laterality: N/A;  . COLONOSCOPY WITH PROPOFOL N/A 01/28/2019   Procedure: COLONOSCOPY WITH PROPOFOL;  Surgeon: Toledo, Benay Pike, MD;  Location: [facility] ENDOSCOPY;  Service: Gastroenterology;  Laterality: N/A;  . ESOPHAGOGASTRODUODENOSCOPY    . ESOPHAGOGASTRODUODENOSCOPY (EGD) WITH PROPOFOL N/A 01/28/2019   Procedure: ESOPHAGOGASTRODUODENOSCOPY (EGD) WITH PROPOFOL;  Surgeon: Toledo, Benay Pike, MD;  Location: [facility] ENDOSCOPY;  Service: Gastroenterology;  Laterality: N/A;  . EYE SURGERY     cataract extraction with IOL bilaterally  . JOINT REPLACEMENT    . LUMBAR LAMINECTOMY/DECOMPRESSION MICRODISCECTOMY  05/24/2011   Procedure: LUMBAR LAMINECTOMY/DECOMPRESSION MICRODISCECTOMY 2 LEVELS;  Surgeon: Magnus Sinning, MD;  Location: [facility] ORS;  Service: Orthopedics;  Laterality: N/A;  L2-L3, L3-L4 (x-ray)  . RIGHT OOPHORECTOMY     due to STAPH INFECTION    Prior to Admission medications   Medication Sig Start Date End Date Taking? Authorizing Provider  acetaminophen (TYLENOL) 500 MG tablet Take 1,000 mg by mouth every 6 (six) hours as needed. Pain     [provider]  albuterol (PROVENTIL) (2.5 MG/3ML) 0.083% nebulizer solution Take 3 mLs (2.5 mg total) by  nebulization every 6 (six) hours as needed for wheezing or shortness of breath. 04/05/20   Jerrol Banana., MD  amiodarone (PACERONE) 200 MG tablet Take 1 tablet (200 mg) by mouth one daily as directed 05/11/20   Vickie Epley, MD  aspirin EC 81 MG tablet Take 1 tablet (81 mg total) by mouth daily. Swallow whole. 04/18/20   Marrianne Mood D, PA-C  atorvastatin (LIPITOR) 40 MG tablet Take 1 tablet (40 mg total) by mouth daily. 04/18/20   Minna Merritts, MD  azithromycin (ZITHROMAX) 250 MG tablet Take 1 tablet (250 mg total) by mouth daily. Take only and last does 3/5 AM 05/27/20   Samuella Cota, MD  carvedilol (COREG) 6.25 MG tablet Take 1 tablet (6.25 mg total) by mouth 2 (two) times daily with a meal. 04/27/20 04/22/21  Marrianne Mood D, PA-C  glucose blood (CONTOUR NEXT TEST) test strip Check sugar once daily  DX E11.9 11/16/16   Jerrol Banana., MD  guaiFENesin (MUCINEX) 600 MG 12 hr tablet Take 1 tablet (600 mg total) by mouth  2 (two) times daily as needed for up to 20 days. 05/27/20 06/16/20  Samuella Cota, MD  hydrOXYzine (ATARAX/VISTARIL) 10 MG tablet TAKE 1 TO 2 TABLETS(10 TO 20 MG) BY MOUTH EVERY 6 HOURS AS NEEDED 04/26/20   Jerrol Banana., MD  levothyroxine (SYNTHROID) 100 MCG tablet TAKE 1 TABLET(100 MCG) BY MOUTH DAILY BEFORE BREAKFAST 08/17/19   Jerrol Banana., MD  losartan (COZAAR) 25 MG tablet Take 1 tablet (25 mg total) by mouth every evening. 03/29/20   Fritzi Mandes, MD  metFORMIN (GLUCOPHAGE) 1000 MG tablet TAKE 1 TABLET(1000 MG) BY MOUTH DAILY WITH SUPPER 05/08/20   Jerrol Banana., MD  omeprazole (PRILOSEC) 20 MG capsule TAKE 1 CAPSULE(20 MG) BY MOUTH DAILY 05/31/20   Jerrol Banana., MD  tiotropium (SPIRIVA) 18 MCG inhalation capsule Place 1 capsule (18 mcg total) into inhaler and inhale daily as needed. 04/05/20   Jerrol Banana., MD  torsemide (DEMADEX) 20 MG tablet Take 20 mg po daily in AM. Take one extra dose 20mg  PRN 3lb  weight gain or abdominal fullness. 05/27/20   Samuella Cota, MD     Allergies Oxycodone and Prednisone  Family History  Problem Relation Age of Onset  . Dementia Mother   . Lung cancer Father   . Heart disease Father   . Hypertension Sister   . Breast cancer Maternal Aunt   . Arthritis Son     Social History Social History   Tobacco Use  . Smoking status: Former Smoker    Packs/day: 0.50    Years: 50.00    Pack years: 25.00    Types: Cigarettes    Quit date: 05/16/2004    Years since quitting: 16.0  . Smokeless tobacco: Never Used  Vaping Use  . Vaping Use: Never used  Substance Use Topics  . Alcohol use: Yes    Comment: socially-1 glass of wine or [place] icea tea once a month  . Drug use: No    Review of Systems  Constitutional: No fever/chills Eyes: No visual changes.  ENT: No sore throat. Cardiovascular: Denies chest pain. Respiratory: As above Gastrointestinal: No abdominal pain.  No nausea, no vomiting.   Genitourinary: Negative for dysuria. Musculoskeletal: Negative for back pain. Skin: Negative for rash. Neurological: Negative for headaches   ____________________________________________   PHYSICAL EXAM:  VITAL SIGNS: ED Triage Vitals  Enc Vitals Group     BP 06/06/20 0932 (!) 114/57     Pulse Rate 06/06/20 0921 93     Resp 06/06/20 0932 (!) 21     Temp 06/06/20 0921 (!) 97.5 F (36.4 C)     Temp Source 06/06/20 0921 Oral     SpO2 06/06/20 0932 93 %     Weight 06/06/20 0923 78 kg (172 lb)     Height 06/06/20 0923 1.626 m (5\' 4" )     Head Circumference --      Peak Flow --      Pain Score 06/06/20 0923 0     Pain Loc --      Pain Edu? --      Excl. in [place]? --     Constitutional: Alert and oriented  Nose: No congestion/rhinnorhea. Mouth/Throat: Mucous membranes are moist.   Neck:  Painless ROM Cardiovascular: Normal rate, regular rhythm. Grossly normal heart sounds.  Good peripheral circulation. Respiratory: Increased  respiratory effort with mild tachypnea ,no retractions.  No significant wheezing on exam Gastrointestinal: Soft and nontender. No  distention.  . Genitourinary: deferred Musculoskeletal: No lower extremity tenderness nor edema.  Warm and well perfused Neurologic:  Normal speech and language. No gross focal neurologic deficits are appreciated.  Skin:  Skin is warm, dry and intact. No rash noted. Psychiatric: Mood and affect are normal. Speech and behavior are normal.  ____________________________________________   LABS (all labs ordered are listed, but only abnormal results are displayed)  Labs Reviewed  CBC WITH DIFFERENTIAL/PLATELET - Abnormal; Notable for the following components:      Result Value   Hemoglobin 11.7 (*)    MCH 24.8 (*)    MCHC 29.1 (*)    All other components within normal limits  COMPREHENSIVE METABOLIC PANEL - Abnormal; Notable for the following components:   Glucose, Bld 228 (*)    All other components within normal limits  BRAIN NATRIURETIC PEPTIDE - Abnormal; Notable for the following components:   B Natriuretic Peptide 361.8 (*)    All other components within normal limits  TROPONIN I (HIGH SENSITIVITY)   ____________________________________________  EKG  ED ECG REPORT I, Lavonia Drafts, the attending physician, personally viewed and interpreted this ECG.  Date: 06/06/2020  Rhythm: normal sinus rhythm QRS Axis: normal Intervals: Left bundle branch block ST/T Wave abnormalities: normal Narrative Interpretation: no evidence of acute ischemia  ____________________________________________  RADIOLOGY Chest x-ray viewed by me ____________________________________________   PROCEDURES  Procedure(s) performed: No  Procedures   Critical Care performed: No ____________________________________________   INITIAL IMPRESSION / ASSESSMENT AND PLAN / ED COURSE  Pertinent labs & imaging results that were available during my care of the patient were  reviewed by me and considered in my medical decision making (see chart for details).  Patient presents with shortness of breath.  Suspicious for CHF versus COPD, no fever or significant cough to indicate pneumonia  Did improve significantly with albuterol nebulizers given by EMS.  Mildly tachypneic here however not requiring supplemental oxygen at this time.  Pending labs, chest x-ray, will treat with Lasix and additional DuoNeb  Lab work notable for elevated BNP, chest x-ray without overt edema patient still with significant shortness of breath after IV Lasix  She is tachypneic and oxygen saturations are in the low 90s, she will require admission for further diuresis and management.    ____________________________________________   FINAL CLINICAL IMPRESSION(S) / ED DIAGNOSES  Final diagnoses:  Acute on chronic congestive heart failure, unspecified heart failure type [facility])        Note:  This document was prepared using Dragon voice recognition software and may include unintentional dictation errors.   Lavonia Drafts, MD 06/06/20 1300

## 2020-06-06 NOTE — ED Notes (Signed)
RN informed bed assigned 

## 2020-06-06 NOTE — H&P (Addendum)
History and Physical    Kathryn [name] YYT:[id number] DOB: [birth year] DOA: 06/06/2020  Referring MD/NP/PA:   PCP: Jerrol Banana., MD   Patient coming from:  The patient is coming from home.  At baseline, pt is independent for most of ADL.        Chief Complaint: SOB  HPI: Kathryn [name] is a 87 y.o. female with medical history significant of hypertension, hyperlipidemia, diabetes mellitus, COPD, GERD, hypothyroidism, skin cancer, PVD, left bundle blockade, gastric ulcer, CHF with EF of 40-45%, former smoker, who presents with shortness of breath.  Patient was recently hospitalized from 3/1-3/4 due to exacerbation of CHF and COPD.  Patient was discharged in stable condition.  Patient states that she shortness breath again in the past 2 days, which has been progressively worsening.  She has dry cough, no fever or chills.  Denies chest pain, but states that she feels a tight in the left lower chest.  No nausea vomiting, diarrhea, abdominal pain, symptoms of UTI.  Per her son (I called her son by phone), patient has gained 3 pounds of body weight in the past several days.  ED Course: pt was found to have BNP 361, troponin level 13, pending COVID-19 PCR, electrolytes renal function okay, temperature 97.5, blood pressure 114/57, heart rate 84, RR 21, oxygen saturation 93% on room air.  Chest x-ray showed COPD, small bilateral pleural effusions and bibasilar atelectasis/infiltrate unchanged from 05/24/2020. Pt is medially to progressive bed as inpatient.   Review of Systems:   General: no fevers, chills, no body weight gain, has fatigue HEENT: no blurry vision, hearing changes or sore throat Respiratory: no dyspnea, coughing, wheezing CV: no chest pain, no palpitations GI: no nausea, vomiting, abdominal pain, diarrhea, constipation GU: no dysuria, burning on urination, increased urinary frequency, hematuria  Ext: has leg edema Neuro: no unilateral weakness, numbness, or tingling, no  vision change or hearing loss Skin: no rash, no skin tear. MSK: No muscle spasm, no deformity, no limitation of range of movement in spin Heme: No easy bruising.  Travel history: No recent long distant travel.  Allergy:  Allergies  Allergen Reactions  . Oxycodone Other (See Comments)    Mental status change  . Prednisone     swelling, bruising.    Past Medical History:  Diagnosis Date  . Actinic keratosis   . Aortic atherosclerosis ([place]) 05/25/2020  . Arthritis    spinal stenosis  . Cardiomyopathy - presumed to be nonischemic    a. 03/2020 Echo: EF 40-45%, gr2 DD. Nl RV fxn. Mildly dil LA. Mild MR/ao sclerosis; b. 03/2020 MV: EF 30-44%, no ischemia. Cor Ca2+ in pLAD.  Marland Kitchen CHF (congestive heart failure) ([place])   . COPD (chronic obstructive pulmonary disease) ([place])   . Coronary artery calcification seen on CT scan    a. 03/2020 MV: CT attenuation images show Ao and mod pLAD Ca2+.  . Diabetes mellitus without complication ([place])   . Gastric ulcer 4/12   treated with Prolisec- states no problems now  . Hyperlipidemia   . Hypertension    PCP Dr Miguel Aschoff   [place]  . Hypothyroidism   . Left bundle branch block (LBBB)   . Neuromuscular disorder ([place])    slight numbness right toes- comes and goes  . Peripheral vascular disease (HCC)    varicose veins left leg  . Pneumonia   . PSVT (paroxysmal supraventricular tachycardia) ([place])    a. 03/2020 Zio: Avg HR 87 (66-211).  4  beats NSVT.  43 PSVT episodes, longest 10h 61m @ avg of 153 bpm (max 211).  Seen by EP-->amio started (pt wished to avoid EPS/RFCA).  . Seasonal allergies   . Shortness of breath   . Skin cancer    multiple from face  . Squamous cell carcinoma of skin 03/23/2013   L dorsal hand  . Squamous cell carcinoma of skin 02/04/2014   R forearm/in situ, L pretibial  . Squamous cell carcinoma of skin 09/24/2018   R thumb    Past Surgical History:  Procedure Laterality Date  . BACK SURGERY     4/12  Dr Shellia Carwin-  for lumbar stenosis  . BREAST CYST ASPIRATION Left 1965   negative  . BREAST SURGERY  1962   lumpectomy with biopsy   left  . CARPAL TUNNEL RELEASE     right  . COLONOSCOPY    . COLONOSCOPY WITH PROPOFOL N/A 06/30/2015   Procedure: COLONOSCOPY WITH PROPOFOL;  Surgeon: Hulen Luster, MD;  Location: [facility] ENDOSCOPY;  Service: Gastroenterology;  Laterality: N/A;  . COLONOSCOPY WITH PROPOFOL N/A 01/22/2017   Procedure: COLONOSCOPY WITH PROPOFOL;  Surgeon: Lollie Sails, MD;  Location: [facility] ENDOSCOPY;  Service: Endoscopy;  Laterality: N/A;  . COLONOSCOPY WITH PROPOFOL N/A 01/28/2019   Procedure: COLONOSCOPY WITH PROPOFOL;  Surgeon: Toledo, Benay Pike, MD;  Location: [facility] ENDOSCOPY;  Service: Gastroenterology;  Laterality: N/A;  . ESOPHAGOGASTRODUODENOSCOPY    . ESOPHAGOGASTRODUODENOSCOPY (EGD) WITH PROPOFOL N/A 01/28/2019   Procedure: ESOPHAGOGASTRODUODENOSCOPY (EGD) WITH PROPOFOL;  Surgeon: Toledo, Benay Pike, MD;  Location: [facility] ENDOSCOPY;  Service: Gastroenterology;  Laterality: N/A;  . EYE SURGERY     cataract extraction with IOL bilaterally  . JOINT REPLACEMENT    . LUMBAR LAMINECTOMY/DECOMPRESSION MICRODISCECTOMY  05/24/2011   Procedure: LUMBAR LAMINECTOMY/DECOMPRESSION MICRODISCECTOMY 2 LEVELS;  Surgeon: Magnus Sinning, MD;  Location: [facility] ORS;  Service: Orthopedics;  Laterality: N/A;  L2-L3, L3-L4 (x-ray)  . RIGHT OOPHORECTOMY     due to STAPH INFECTION    Social History:  reports that she quit smoking about 16 years ago. Her smoking use included cigarettes. She has a 25.00 pack-year smoking history. She has never used smokeless tobacco. She reports current alcohol use. She reports that she does not use drugs.  Family History:  Family History  Problem Relation Age of Onset  . Dementia Mother   . Lung cancer Father   . Heart disease Father   . Hypertension Sister   . Breast cancer Maternal Aunt   . Arthritis Son      Prior to Admission medications   Medication Sig Start Date End  Date Taking? Authorizing Provider  acetaminophen (TYLENOL) 500 MG tablet Take 1,000 mg by mouth every 6 (six) hours as needed. Pain     [provider]  albuterol (PROVENTIL) (2.5 MG/3ML) 0.083% nebulizer solution Take 3 mLs (2.5 mg total) by nebulization every 6 (six) hours as needed for wheezing or shortness of breath. 04/05/20   Jerrol Banana., MD  amiodarone (PACERONE) 200 MG tablet Take 1 tablet (200 mg) by mouth one daily as directed 05/11/20   Vickie Epley, MD  aspirin EC 81 MG tablet Take 1 tablet (81 mg total) by mouth daily. Swallow whole. 04/18/20   Marrianne Mood D, PA-C  atorvastatin (LIPITOR) 40 MG tablet Take 1 tablet (40 mg total) by mouth daily. 04/18/20   Minna Merritts, MD  azithromycin (ZITHROMAX) 250 MG tablet Take 1 tablet (250 mg total) by mouth daily. Take  only and last does 3/5 AM 05/27/20   Samuella Cota, MD  carvedilol (COREG) 6.25 MG tablet Take 1 tablet (6.25 mg total) by mouth 2 (two) times daily with a meal. 04/27/20 04/22/21  Marrianne Mood D, PA-C  glucose blood (CONTOUR NEXT TEST) test strip Check sugar once daily  DX E11.9 11/16/16   Jerrol Banana., MD  guaiFENesin (MUCINEX) 600 MG 12 hr tablet Take 1 tablet (600 mg total) by mouth 2 (two) times daily as needed for up to 20 days. 05/27/20 06/16/20  Samuella Cota, MD  hydrOXYzine (ATARAX/VISTARIL) 10 MG tablet TAKE 1 TO 2 TABLETS(10 TO 20 MG) BY MOUTH EVERY 6 HOURS AS NEEDED 04/26/20   Jerrol Banana., MD  levothyroxine (SYNTHROID) 100 MCG tablet TAKE 1 TABLET(100 MCG) BY MOUTH DAILY BEFORE BREAKFAST 08/17/19   Jerrol Banana., MD  losartan (COZAAR) 25 MG tablet Take 1 tablet (25 mg total) by mouth every evening. 03/29/20   Fritzi Mandes, MD  metFORMIN (GLUCOPHAGE) 1000 MG tablet TAKE 1 TABLET(1000 MG) BY MOUTH DAILY WITH SUPPER 05/08/20   Jerrol Banana., MD  omeprazole (PRILOSEC) 20 MG capsule TAKE 1 CAPSULE(20 MG) BY MOUTH DAILY 05/31/20   Jerrol Banana., MD   tiotropium (SPIRIVA) 18 MCG inhalation capsule Place 1 capsule (18 mcg total) into inhaler and inhale daily as needed. 04/05/20   Jerrol Banana., MD  torsemide (DEMADEX) 20 MG tablet Take 20 mg po daily in AM. Take one extra dose 20mg  PRN 3lb weight gain or abdominal fullness. 05/27/20   Samuella Cota, MD    Physical Exam: Vitals:   06/06/20 0923 06/06/20 0932 06/06/20 1357 06/06/20 1400  BP:  (!) 114/57 (!) 120/54 105/70  Pulse:  84 74 74  Resp:  (!) 21 14 12   Temp:      TempSrc:      SpO2:  93% 98% 100%  Weight: 78 kg     Height: 5\' 4"  (1.626 m)      General: Not in acute distress HEENT:       Eyes: PERRL, EOMI, no scleral icterus.       ENT: No discharge from the ears and nose, no pharynx injection, no tonsillar enlargement.        Neck: No JVD, no bruit, no mass felt. Heme: No neck lymph node enlargement. Cardiac: S1/S2, RRR, No murmurs, No gallops or rubs. Respiratory: Has wheezing bilaterally GI: Soft, nondistended, nontender, no rebound pain, no organomegaly, BS present. GU: No hematuria Ext: has trance leg edema bilaterally. 1+DP/PT pulse bilaterally. Musculoskeletal: No joint deformities, No joint redness or warmth, no limitation of ROM in spin. Skin: No rashes.  Neuro: Alert, oriented X3, cranial nerves II-XII grossly intact, moves all extremities normally.  Psych: Patient is not psychotic, no suicidal or hemocidal ideation.  Labs on Admission: I have personally reviewed following labs and imaging studies  CBC: Recent Labs  Lab 06/06/20 0930  WBC 8.7  NEUTROABS 6.5  HGB 11.7*  HCT 40.2  MCV 85.2  PLT 182   Basic Metabolic Panel: Recent Labs  Lab 06/06/20 0930  NA 140  K 4.2  CL 106  CO2 26  GLUCOSE 228*  BUN 20  CREATININE 0.91  CALCIUM 9.3   GFR: Estimated Creatinine Clearance: 46.5 mL/min (by C-G formula based on SCr of 0.91 mg/dL). Liver Function Tests: Recent Labs  Lab 06/06/20 0930  AST 19  ALT 17  ALKPHOS 66  BILITOT 0.7  PROT 7.2  ALBUMIN 4.0   No results for input(s): LIPASE, AMYLASE in the last 168 hours. No results for input(s): AMMONIA in the last 168 hours. Coagulation Profile: No results for input(s): INR, PROTIME in the last 168 hours. Cardiac Enzymes: No results for input(s): CKTOTAL, CKMB, CKMBINDEX, TROPONINI in the last 168 hours. BNP (last 3 results) No results for input(s): PROBNP in the last 8760 hours. HbA1C: No results for input(s): HGBA1C in the last 72 hours. CBG: No results for input(s): GLUCAP in the last 168 hours. Lipid Profile: No results for input(s): CHOL, HDL, LDLCALC, TRIG, CHOLHDL, LDLDIRECT in the last 72 hours. Thyroid Function Tests: No results for input(s): TSH, T4TOTAL, FREET4, T3FREE, THYROIDAB in the last 72 hours. Anemia Panel: No results for input(s): VITAMINB12, FOLATE, FERRITIN, TIBC, IRON, RETICCTPCT in the last 72 hours. Urine analysis:    Component Value Date/Time   COLORURINE STRAW (A) 05/24/2020 0746   APPEARANCEUR CLEAR (A) 05/24/2020 0746   APPEARANCEUR Clear 06/23/2014 1357   LABSPEC 1.016 05/24/2020 0746   LABSPEC 1.020 06/23/2014 1357   PHURINE 5.0 05/24/2020 0746   GLUCOSEU 150 (A) 05/24/2020 0746   GLUCOSEU Negative 06/23/2014 1357   HGBUR NEGATIVE 05/24/2020 0746   BILIRUBINUR NEGATIVE 05/24/2020 0746   BILIRUBINUR Negative 01/19/2020 1112   BILIRUBINUR Negative 06/23/2014 1357   KETONESUR NEGATIVE 05/24/2020 0746   PROTEINUR NEGATIVE 05/24/2020 0746   UROBILINOGEN 0.2 01/19/2020 1112   NITRITE NEGATIVE 05/24/2020 0746   LEUKOCYTESUR SMALL (A) 05/24/2020 0746   LEUKOCYTESUR 1+ 06/23/2014 1357   Sepsis Labs: @LABRCNTIP (procalcitonin:4,lacticidven:4) )No results found for this or any previous visit (from the past 240 hour(s)).   Radiological Exams on Admission: DG Chest 2 View  Result Date: 06/06/2020 CLINICAL DATA:  Short of breath EXAM: CHEST - 2 VIEW COMPARISON:  Chest x-ray and CT chest 05/24/2020 FINDINGS: COPD with  hyperinflation lungs.  Prominent lung markings. Heart size mildly enlarged. Atherosclerotic aortic arch. Small bilateral effusions and bibasilar atelectasis/infiltrate unchanged from the prior study. No definite pulmonary edema. IMPRESSION: Stable chest x-ray. COPD. Small bilateral pleural effusions and bibasilar atelectasis/infiltrate unchanged from 05/24/2020 Electronically Signed   By: Franchot Gallo M.D.   On: 06/06/2020 10:18     EKG: I have personally reviewed.  Not done in ED, will get one.   Assessment/Plan Principal Problem:   COPD exacerbation (HCC) Active Problems:   Hyperlipidemia   Essential hypertension   Hypothyroidism   Diabetes mellitus without complication (HCC)   PSVT (paroxysmal supraventricular tachycardia) (HCC)   Acute on chronic combined systolic and diastolic CHF (congestive heart failure) (HCC)   COPD exacerbation and CHF exacerbation: Shortness of breath is likely due to combination of COPD and CHF.  Patient has a wheeze on auscultation, consistent with COPD exacerbation.  Patient has elevated BNP and body weight gain, consistent with CHF exacerbation.  Patient had negative CT angiogram for PE on 3/1, I will not repeat CT angiogram today.  Wll treat the patient empirically for COPD and CHF exacerbation.  -Admit to progressive bed as inpatient -Bronchodilators -Solu-Medrol -IV Lasix -Nasal cannula oxygen to maintain oxygen saturation above 93%  Acute on chronic systolic CHF: -Lasix 40 mg bid by IV - will not repeat 2d echo since pt had on on 03/28/20 which showed EF of 40-45%  COPD exacerbation (HCC) -Bronchodilators -Patient is allergic to prednisone, will not give more steroid -Doxycycline  Hyperlipidemia -Lipitor  Essential hypertension -IV hydralazine as needed -Patient is on IV Lasix -Coreg, Cozaar  Hypothyroidism -Synthroid  Diabetes mellitus without complication [facility]): Recent A1c 6.9, well controlled.  Patient is taking Metformin at  home -Sliding scale insulin  PSVT: HR 84 now -Continue home Coreg and  amiodarone    DVT ppx: SQ Lovenox Code Status: DNR per her son Family Communication: not done, no family member is at bed side.     Disposition Plan:  Anticipate discharge back to previous environment Consults called:  none Admission status and Level of care: Progressive Cardiac:  as inpt       Status is: Inpatient  Remains inpatient appropriate because:Inpatient level of care appropriate due to severity of illness   Dispo: The patient is from: Home              Anticipated d/c is to: Home              Patient currently is not medically stable to d/c.   Difficult to place patient No          Date of Service 06/06/2020    [facility]   If 7PM-7AM, please contact night-coverage [email or web address] 06/06/2020, 3:57 PM

## 2020-06-06 NOTE — ED Triage Notes (Signed)
Pt arrived by ems at home; complaints of trouble breathing; wheezing noted, ems adminstered one duoneb; ems vital cbg 402, bp 162/98; alert oriented x4

## 2020-06-07 LAB — BASIC METABOLIC PANEL
Anion gap: 10 (ref 5–15)
BUN: 24 mg/dL — ABNORMAL HIGH (ref 8–23)
CO2: 28 mmol/L (ref 22–32)
Calcium: 9.7 mg/dL (ref 8.9–10.3)
Chloride: 101 mmol/L (ref 98–111)
Creatinine, Ser: 1.12 mg/dL — ABNORMAL HIGH (ref 0.44–1.00)
GFR, Estimated: 48 mL/min — ABNORMAL LOW (ref >60)
Glucose, Bld: 147 mg/dL — ABNORMAL HIGH (ref 70–99)
Potassium: 4.9 mmol/L (ref 3.5–5.1)
Sodium: 139 mmol/L (ref 135–145)

## 2020-06-07 LAB — CBC
HCT: 37.4 % (ref 36.0–46.0)
Hemoglobin: 11.4 g/dL — ABNORMAL LOW (ref 12.0–15.0)
MCH: 25.4 pg — ABNORMAL LOW (ref 26.0–34.0)
MCHC: 30.5 g/dL (ref 30.0–36.0)
MCV: 83.3 fL (ref 80.0–100.0)
Platelets: 263 10*3/uL (ref 150–400)
RBC: 4.49 MIL/uL (ref 3.87–5.11)
RDW: 15.6 % — ABNORMAL HIGH (ref 11.5–15.5)
WBC: 7 10*3/uL (ref 4.0–10.5)
nRBC: 0 % (ref 0.0–0.2)

## 2020-06-07 LAB — GLUCOSE, CAPILLARY
Glucose-Capillary: 199 mg/dL — ABNORMAL HIGH (ref 70–99)
Glucose-Capillary: 163 mg/dL — ABNORMAL HIGH (ref 70–99)
Glucose-Capillary: 140 mg/dL — ABNORMAL HIGH (ref 70–99)
Glucose-Capillary: 144 mg/dL — ABNORMAL HIGH (ref 70–99)

## 2020-06-07 LAB — MAGNESIUM: Magnesium: 1.9 mg/dL (ref 1.7–2.4)

## 2020-06-07 LAB — SARS CORONAVIRUS 2 (TAT 6-24 HRS): SARS Coronavirus 2: NEGATIVE

## 2020-06-07 MED ORDER — TORSEMIDE 20 MG PO TABS
20.0000 mg | ORAL_TABLET | Freq: Every day | ORAL | Status: DC
  Administered 2020-06-08: 20 mg via ORAL
  Filled 2020-06-07: qty 1

## 2020-06-07 MED ORDER — IPRATROPIUM-ALBUTEROL 0.5-2.5 (3) MG/3ML IN SOLN
3.0000 mL | Freq: Two times a day (BID) | RESPIRATORY_TRACT | Status: DC
  Administered 2020-06-07 – 2020-06-08 (×2): 3 mL via RESPIRATORY_TRACT
  Filled 2020-06-07 (×2): qty 3

## 2020-06-07 NOTE — Evaluation (Signed)
Occupational Therapy Evaluation Patient Details Name: Kathryn [name] MRN: [id number] DOB: [birth year] Today's Date: 06/07/2020    History of Present Illness Pt is an 87 y.o. female presenting to hospital 3/14 with SOB.  Recent hospitalization 3/1-3/4 d/t exacerbation of CHF and COPD.  Pt now admitted with COPD exacerbation and CHF exacerbation  PMH includes CHF, COPD, htn, DM, neuromuscular disorder (slight numbness R toes-comes and goes), PVD, PSVT, skin CA, back surgery, L breast lumpectomy, CTR R, joint replacement.   Clinical Impression   Ms Frankowski was seen for OT evaluation this date. Prior to hospital admission, pt was Independent for mobility and ADLs. Pt lives in mother-in-law suite in sons house. Pt presents to acute OT demonstrating impaired ADL performance and functional mobility 2/2 decreased activity tolerance and functional balance deficits. Pt currently requires SUPERVISION for in room mobility and standing grooming tasks - pt noted to be furniture walking in room. Pt fatigues quickly. RN in room end of session, OT to follow up for ECS education. Pt would benefit from skilled OT to address noted impairments and functional limitations (see below for any additional details) in order to maximize safety and independence while minimizing falls risk and caregiver burden. Upon hospital discharge, anticipate no OT follow up.      Follow Up Recommendations  No OT follow up;Supervision - Intermittent    Equipment Recommendations  None recommended by OT    Recommendations for Other Services       Precautions / Restrictions Restrictions Weight Bearing Restrictions: No      Mobility Bed Mobility Overal bed mobility: Modified Independent Bed Mobility: Supine to Sit;Sit to Supine     Supine to sit: Modified independent (Device/Increase time);HOB elevated Sit to supine: Modified independent (Device/Increase time);HOB elevated   General bed mobility comments: pt received and left  in chair    Transfers Overall transfer level: Needs assistance Equipment used: None Transfers: Sit to/from Stand Sit to Stand: Supervision         General transfer comment: fairly strong stand from bed    Balance Overall balance assessment: Needs assistance Sitting-balance support: No upper extremity supported;Feet supported Sitting balance-Leahy Scale: Normal Sitting balance - Comments: steady sitting reaching outside BOS   Standing balance support: No upper extremity supported Standing balance-Leahy Scale: Good Standing balance comment: steady dynamic outside BOS                           ADL either performed or assessed with clinical judgement   ADL Overall ADL's : Needs assistance/impaired                                       General ADL Comments: SUPERVISION for in room mobility an standing grooming tasks - pt noted to be furniture walking in room. Endorses fatigues quickly     Vision Baseline Vision/History: Wears glasses Wears Glasses: At all times Patient Visual Report: No change from baseline              Pertinent Vitals/Pain Pain Assessment: No/denies pain     Hand Dominance Right   Extremity/Trunk Assessment Upper Extremity Assessment Upper Extremity Assessment: Overall WFL for tasks assessed   Lower Extremity Assessment Lower Extremity Assessment: Overall WFL for tasks assessed   Cervical / Trunk Assessment Cervical / Trunk Assessment: Normal   Communication Communication Communication: No difficulties   Cognition Arousal/Alertness:  Awake/alert Behavior During Therapy: WFL for tasks assessed/performed Overall Cognitive Status: Within Functional Limits for tasks assessed                                     General Comments       Exercises Exercises: Other exercises Other Exercises Other Exercises: Pt educated re: OT role, DME recs, d/c recs, falls prevention Other Exercises: Grooming,  sit<>stand, sitting/standing balance/tolerance, in room ~20 ft mobility   Shoulder Instructions      Home Living Family/patient expects to be discharged to:: Private residence Living Arrangements: Children Available Help at Discharge: Family;Available PRN/intermittently Type of Home: House Home Access: Stairs to enter [employer] of Steps: 2 Entrance Stairs-Rails: Right;Left;Can reach both Home Layout: One level     Bathroom Shower/Tub: Walk-in shower         Home Equipment: Environmental consultant - 4 wheels;Wheelchair - manual;Cane - single point          Prior Functioning/Environment Level of Independence: Independent        Comments: No AD use in home but uses SPC in community.  Lives in mother in law suite within son's home.        OT Problem List: Decreased strength;Decreased activity tolerance;Impaired balance (sitting and/or standing);Decreased safety awareness      OT Treatment/Interventions: Self-care/ADL training;Therapeutic exercise;Energy conservation;DME and/or AE instruction;Therapeutic activities;Patient/family education;Balance training;Manual therapy    OT Goals(Current goals can be found in the care plan section) Acute Rehab OT Goals Patient Stated Goal: to go home OT Goal Formulation: With patient Time For Goal Achievement: 06/21/20 Potential to Achieve Goals: Good ADL Goals Pt Will Perform Grooming: Independently Pt Will Transfer to Toilet: Independently Pt/caregiver will Perform Home Exercise Program: Increased strength;Both right and left upper extremity  OT Frequency: Min 1X/week    AM-PAC OT "6 Clicks" Daily Activity     Outcome Measure Help from another person eating meals?: None Help from another person taking care of personal grooming?: None Help from another person toileting, which includes using toliet, bedpan, or urinal?: None Help from another person bathing (including washing, rinsing, drying)?: A Little Help from another person to  put on and taking off regular upper body clothing?: None Help from another person to put on and taking off regular lower body clothing?: A Little 6 Click Score: 22   End of Session Nurse Communication: Mobility status  Activity Tolerance: Patient tolerated treatment well Patient left: in chair;with call bell/phone within reach;with nursing/sitter in room  OT Visit Diagnosis: Unsteadiness on feet (R26.81);Muscle weakness (generalized) (M62.81)                Time: 6503-5465 OT Time Calculation (min): 8 min Charges:  OT General Charges $OT Visit: 1 Visit OT Evaluation $OT Eval Low Complexity: 1 Low  Dessie Coma, M.S. OTR/L  06/07/20, 10:52 AM  ascom [phone]

## 2020-06-07 NOTE — Progress Notes (Signed)
PROGRESS NOTE    Kathryn [name]  ZOX:[id number] DOB: 02/09/1934 DOA: 06/06/2020 PCP: Maple Hudson., MD   Chief Complain: Shortness of breath  Brief Narrative: Patient is a 87 year old female with history of hypertension, hyperlipidemia, diabetes type 2 longer COPD, GERD, hypothyroidism, skin cancer, gastric ulcer, congestive heart failure with ejection fraction of 40 to 45%, former smoker who presents from home with complaints of shortness of breath.  At baseline, patient is independent for most of the ADL she was recently hospitalized from 3/1-3/4 due to exacerbation of COPD/CHF.  Patient was discharged home in stable condition.  She developed shortness of breath about 2 days ago which is progressive.  She denies any chest pain on presentation.  Patient also reported that she can 3 pounds of weight within last few  days.  On presentation her BNP was elevated, 361.  She was hemodynamically stable and was saturating fine on room air.  Chest x-ray showed COPD changes, small bilateral pleural effusions, bibasilar atelectasis/infiltrate unchanged from 05/24/2020.  Patient was admitted for the management of CHF/COPD exacerbation.  Assessment & Plan:   Principal Problem:   COPD exacerbation (HCC) Active Problems:   Hyperlipidemia   Essential hypertension   Hypothyroidism   Diabetes mellitus without complication (HCC)   PSVT (paroxysmal supraventricular tachycardia) (HCC)   Acute on chronic combined systolic and diastolic CHF (congestive heart failure) (HCC)   Dyspnea: Dyspnea could be combination of both.  She was admitted for same earlier this month. She had wheezes on auscultation on presentation.  Elevated BNP, weight gain.  She had negative CT angiogram for PE on 3/1. This morning she was on room air, did not complain of shortness of breath.  Acute on chronic systolic CHF: She was started on IV lasix 40 mg twice daily which has been now stopped.  Monitor kidney function while she is  on diuretics.  2D echo done on 03/28/2020 showed ejection fraction of 40 to 45%. Restrict fluid intake to less than 1.2 L a day and salt intake to less than 2 g a day on dc.  Patient should monitor her daily weight at home.  Torsemide to be restarted tomorrow .  Suspected acute COPD exacerbation:Continue bronchodilators, currently she is on room air.  .  Chest x-ray evidence of pneumonia.  She not on oxygen at home.  She is free of wheezes this morning.  Continue bronchodilators  Hyperlipidemia: On Lipitor  Hypertension: Currently blood pressure stable.  Continue current medications.  Hypothyroidism: on Synthyroid  Diabetes type 2: Recent hemoglobin C of 6.9.  Takes Metformin at home.  On sliding scale insulin here.  History of PSVT: Continue home Coreg and amiodarone         DVT prophylaxis: Lovenox Code Status: DNR Family Communication: Discussed with son on phone today Status is: Inpatient  Remains inpatient appropriate because:Inpatient level of care appropriate due to severity of illness   Dispo: The patient is from: Home              Anticipated d/c is to: Home              Patient currently is not medically stable to d/c.   Difficult to place patient No We did not discharge this patient today because she recently got admitted and came back. Its prudent  to monitor her 1 more night.  I have discussed about this with the son and the patient herself.  Potential discharge tomorrow morning   Consultants: None  Procedures:None  Antimicrobials:  Anti-infectives (From admission, onward)   None      Subjective: Patient seen and examined the bedside this morning.  Hemodynamically stable.  Comfortable during my evaluation, on room air.  Denies any shortness of breath or cough or chest pain.  No lower extremity edema.  Objective: Vitals:   06/06/20 1400 06/06/20 1700 06/06/20 2100 06/07/20 0404  BP: 105/70 (!) 105/53 114/65 139/65  Pulse: 74 77 78 79  Resp: 12 (!) 22 17  18   Temp:   97.8 F (36.6 C) 97.7 F (36.5 C)  TempSrc:   Oral Oral  SpO2: 100% 96% 100% 100%  Weight:    77.8 kg  Height:        Intake/Output Summary (Last 24 hours) at 06/07/2020 0836 Last data filed at 06/06/2020 1357 Gross per 24 hour  Intake --  Output 1000 ml  Net -1000 ml   Filed Weights   06/06/20 0923 06/07/20 0404  Weight: 78 kg 77.8 kg    Examination:  General exam: Pleasant elderly female, comfortable HEENT:PERRL,Oral mucosa moist, Ear/Nose normal on gross exam Respiratory system: Bilateral mild basal crackles Cardiovascular system: S1 & S2 heard, RRR. No JVD, murmurs, rubs, gallops or clicks. No pedal edema. Gastrointestinal system: Abdomen is nondistended, soft and nontender. No organomegaly or masses felt. Normal bowel sounds heard. Central nervous system: Alert and oriented. No focal neurological deficits. Extremities: No edema, no clubbing ,no cyanosis Skin: No rashes, lesions or ulcers,no icterus ,no pallor   Data Reviewed: I have personally reviewed following labs and imaging studies  CBC: Recent Labs  Lab 06/06/20 0930 06/07/20 0600  WBC 8.7 7.0  NEUTROABS 6.5  --   HGB 11.7* 11.4*  HCT 40.2 37.4  MCV 85.2 83.3  PLT 308 263   Basic Metabolic Panel: Recent Labs  Lab 06/06/20 0930 06/07/20 0600  NA 140 139  K 4.2 4.9  CL 106 101  CO2 26 28  GLUCOSE 228* 147*  BUN 20 24*  CREATININE 0.91 1.12*  CALCIUM 9.3 9.7  MG  --  1.9   GFR: Estimated Creatinine Clearance: 37.7 mL/min (A) (by C-G formula based on SCr of 1.12 mg/dL (H)). Liver Function Tests: Recent Labs  Lab 06/06/20 0930  AST 19  ALT 17  ALKPHOS 66  BILITOT 0.7  PROT 7.2  ALBUMIN 4.0   No results for input(s): LIPASE, AMYLASE in the last 168 hours. No results for input(s): AMMONIA in the last 168 hours. Coagulation Profile: No results for input(s): INR, PROTIME in the last 168 hours. Cardiac Enzymes: No results for input(s): CKTOTAL, CKMB, CKMBINDEX, TROPONINI in  the last 168 hours. BNP (last 3 results) No results for input(s): PROBNP in the last 8760 hours. HbA1C: No results for input(s): HGBA1C in the last 72 hours. CBG: Recent Labs  Lab 06/06/20 1651 06/06/20 2111  GLUCAP 123* 121*   Lipid Profile: No results for input(s): CHOL, HDL, LDLCALC, TRIG, CHOLHDL, LDLDIRECT in the last 72 hours. Thyroid Function Tests: No results for input(s): TSH, T4TOTAL, FREET4, T3FREE, THYROIDAB in the last 72 hours. Anemia Panel: No results for input(s): VITAMINB12, FOLATE, FERRITIN, TIBC, IRON, RETICCTPCT in the last 72 hours. Sepsis Labs: No results for input(s): PROCALCITON, LATICACIDVEN in the last 168 hours.  Recent Results (from the past 240 hour(s))  SARS CORONAVIRUS 2 (TAT 6-24 HRS) Nasopharyngeal Nasopharyngeal Swab     Status: None   Collection Time: 06/06/20  1:59 PM   Specimen: Nasopharyngeal Swab  Result Value Ref Range Status  SARS Coronavirus 2 NEGATIVE NEGATIVE Final    Comment: (NOTE) SARS-CoV-2 target nucleic acids are NOT DETECTED.  The SARS-CoV-2 RNA is generally detectable in upper and lower respiratory specimens during the acute phase of infection. Negative results do not preclude SARS-CoV-2 infection, do not rule out co-infections with other pathogens, and should not be used as the sole basis for treatment or other patient management decisions. Negative results must be combined with clinical observations, patient history, and epidemiological information. The expected result is Negative.  Fact Sheet for Patients: HairSlick.no  Fact Sheet for Healthcare Providers: quierodirigir.com  This test is not yet approved or cleared by the [place] FDA and  has been authorized for detection and/or diagnosis of SARS-CoV-2 by FDA under an Emergency Use Authorization (EUA). This EUA will remain  in effect (meaning this test can be used) for the duration of the COVID-19  declaration under Se ction 564(b)(1) of the Act, 21 U.S.C. section 360bbb-3(b)(1), unless the authorization is terminated or revoked sooner.  Performed at [facility], [address] [place], [place] [address]          Radiology Studies: DG Chest 2 View  Result Date: 06/06/2020 CLINICAL DATA:  Short of breath EXAM: CHEST - 2 VIEW COMPARISON:  Chest x-ray and CT chest 05/24/2020 FINDINGS: COPD with hyperinflation lungs.  Prominent lung markings. Heart size mildly enlarged. Atherosclerotic aortic arch. Small bilateral effusions and bibasilar atelectasis/infiltrate unchanged from the prior study. No definite pulmonary edema. IMPRESSION: Stable chest x-ray. COPD. Small bilateral pleural effusions and bibasilar atelectasis/infiltrate unchanged from 05/24/2020 Electronically Signed   By: Marlan Palau M.D.   On: 06/06/2020 10:18        Scheduled Meds: . amiodarone  200 mg Oral Daily  . aspirin EC  81 mg Oral Daily  . atorvastatin  40 mg Oral Daily  . carvedilol  6.25 mg Oral BID WC  . enoxaparin (LOVENOX) injection  40 mg Subcutaneous Q24H  . furosemide  40 mg Intravenous BID  . insulin aspart  0-5 Units Subcutaneous QHS  . insulin aspart  0-9 Units Subcutaneous TID WC  . ipratropium-albuterol  3 mL Nebulization Q4H  . levothyroxine  100 mcg Oral QAC breakfast  . losartan  25 mg Oral QPM  . pantoprazole  40 mg Oral Daily  . sodium chloride flush  3 mL Intravenous Q12H   Continuous Infusions: . sodium chloride       LOS: 1 day    Time spent:35 mins. More than 50% of that time was spent in counseling and/or coordination of care.      Burnadette Pop, MD [facility] P3/15/2022, 8:36 AM

## 2020-06-07 NOTE — Evaluation (Signed)
Physical Therapy Evaluation Patient Details Name: Kathryn [name] MRN: [id number] DOB: December 07, 1933 Today's Date: 06/07/2020   History of Present Illness  Pt is an 87 y.o. female presenting to hospital 3/14 with SOB.  Recent hospitalization 3/1-3/4 d/t exacerbation of CHF and COPD.  Pt now admitted with COPD exacerbation and CHF exacerbation  PMH includes CHF, COPD, htn, DM, neuromuscular disorder (slight numbness R toes-comes and goes), PVD, PSVT, skin CA, back surgery, L breast lumpectomy, CTR R, joint replacement.  Clinical Impression  Prior to hospital admission, pt was independent with ambulation (used SPC in community).  Currently pt is modified independent with bed mobility; SBA with transfers; and CGA with ambulation around nursing loop.  Pt holding onto railing in hallway intermittently 2nd half of ambulation d/t increasing L LE antalgic type gait (pt reports h/o this d/t scoliosis and L LE will ache and sometimes L LE will go numb).  Pt reporting LE's aching end of ambulation but symptoms resolved with rest in bed.  Anticipate pt would benefit from [facility] or RW use for longer ambulation distances.  O2 sats 94% or greater on room air and HR 84-98 bpm during sessions activities.  Pt would benefit from skilled PT to address noted impairments and functional limitations (see below for any additional details).  Upon hospital discharge, no further PT needs anticipated.    Follow Up Recommendations No PT follow up    Equipment Recommendations  None recommended by PT    Recommendations for Other Services OT consult     Precautions / Restrictions Restrictions Weight Bearing Restrictions: No      Mobility  Bed Mobility Overal bed mobility: Modified Independent Bed Mobility: Supine to Sit;Sit to Supine     Supine to sit: Modified independent (Device/Increase time);HOB elevated Sit to supine: Modified independent (Device/Increase time);HOB elevated   General bed mobility comments: mild  increased effort to perform on own    Transfers Overall transfer level: Needs assistance Equipment used: None Transfers: Sit to/from Stand Sit to Stand: Supervision         General transfer comment: fairly strong stand from bed  Ambulation/Gait Ambulation/Gait assistance: Min guard Gait Distance (Feet): 190 Feet Assistive device: None (pt intermittently holding onto railing in hallway for support 2nd half of ambulation)   Gait velocity: decreased   General Gait Details: increasing antalgic gait 2nd half of ambulation (decreased stance time L LE)  Stairs            Wheelchair Mobility    Modified Rankin (Stroke Patients Only)       Balance Overall balance assessment: Needs assistance Sitting-balance support: No upper extremity supported;Feet supported Sitting balance-Leahy Scale: Normal Sitting balance - Comments: steady sitting reaching outside BOS   Standing balance support: No upper extremity supported Standing balance-Leahy Scale: Good Standing balance comment: steady standing washing hands at sink                             Pertinent Vitals/Pain Pain Assessment: No/denies pain    Home Living Family/patient expects to be discharged to:: Private residence Living Arrangements: Children Available Help at Discharge: Family;Available PRN/intermittently Type of Home: House Home Access: Stairs to enter Entrance Stairs-Rails: Right;Left;Can reach both Entrance Stairs-Number of Steps: 2 Home Layout: One level Home Equipment: Walker - 4 wheels;Wheelchair - manual;Cane - single point      Prior Function Level of Independence: Independent         Comments: No AD  use in home but uses SPC in community.  Lives in mother in law suite within son's home.     Hand Dominance   Dominant Hand: Right    Extremity/Trunk Assessment   Upper Extremity Assessment Upper Extremity Assessment: Overall WFL for tasks assessed    Lower Extremity  Assessment Lower Extremity Assessment: Overall WFL for tasks assessed    Cervical / Trunk Assessment Cervical / Trunk Assessment: Normal  Communication   Communication: No difficulties  Cognition Arousal/Alertness: Awake/alert Behavior During Therapy: WFL for tasks assessed/performed Overall Cognitive Status: Within Functional Limits for tasks assessed                                        General Comments  Pt agreeable to PT session.    Exercises     Assessment/Plan    PT Assessment Patient needs continued PT services  PT Problem List Decreased activity tolerance;Decreased balance;Decreased mobility;Cardiopulmonary status limiting activity       PT Treatment Interventions DME instruction;Gait training;Stair training;Functional mobility training;Therapeutic activities;Therapeutic exercise;Balance training;Patient/family education    PT Goals (Current goals can be found in the Care Plan section)  Acute Rehab PT Goals Patient Stated Goal: to go home PT Goal Formulation: With patient Time For Goal Achievement: 06/21/20 Potential to Achieve Goals: Good    Frequency Min 2X/week   Barriers to discharge        Co-evaluation               AM-PAC PT "6 Clicks" Mobility  Outcome Measure Help needed turning from your back to your side while in a flat bed without using bedrails?: None Help needed moving from lying on your back to sitting on the side of a flat bed without using bedrails?: None Help needed moving to and from a bed to a chair (including a wheelchair)?: A Little Help needed standing up from a chair using your arms (e.g., wheelchair or bedside chair)?: A Little Help needed to walk in hospital room?: A Little Help needed climbing 3-5 steps with a railing? : A Little 6 Click Score: 20    End of Session Equipment Utilized During Treatment: Gait belt Activity Tolerance: Patient tolerated treatment well Patient left: in bed;with call  bell/phone within reach;with nursing/sitter in room Nurse Communication: Mobility status;Precautions PT Visit Diagnosis: Other abnormalities of gait and mobility (R26.89)    Time: 1740-8144 PT Time Calculation (min) (ACUTE ONLY): 19 min   Charges:   PT Evaluation $PT Eval Low Complexity: 1 Low PT Treatments $Therapeutic Activity: 8-22 mins       Leitha Bleak, PT 06/07/20, 10:07 AM

## 2020-06-07 NOTE — Consult Note (Addendum)
   Heart Failure Nurse Navigator Note  HFrEF 40-45%  Presented to the emergency room by way of EMS with sudden onset of shortness of breath.  Comorbidities:  COPD/emphysema Hyperlipidemia Coronary artery disease Arthritis Diabetes Hypertension  Zio patch worn in January 2022 revealed PSVT.  Patient was started on amiodarone.  She has a chronic left bundle branch block.  Medications:  Amiodarone 200 mg daily Aspirin 81 mg daily Atorvastatin 40 mg daily Coreg 6.25 mg 2 times a day with meals Furosemide 40 mg IV twice a day Cozaar 25 mg in the evening  Labs:  BNP 361, sodium 139, potassium 4.9, chloride 101, CO2 28, BUN 24, creatinine 1.12, magnesium 1.9. Weight 77.8 kg BMI 29.46 Blood pressure 93/52   Assessment:  General-she is awake and alert sitting up in the chair at bedside in no acute distress.  HEENT-patient wears glasses, unable to evaluate for JVD.  Cardiac-heart tones of regular rate and rhythm, no murmur or gallop appreciated.  Chest-breath sounds are diminished in the bases bilaterally with fine crackles on the right.  Abdominal-soft nontender, non distended  Musculoskeletal-there is no lower extremity edema noted  Neurologic-speech is clear she moves all extremities without difficulty  Psych-she is pleasant and appropriate makes good eye contact.   Met with patient today, she had just been discharged on March 4 after being admitted for heart failure.  She had canceled her appointment in the heart failure clinic since she felt well.  She did notice a 2 to 3 pound weight gain on Saturday, March 12 and took an extra fluid pill she said Sunday weight was back to 171.  She states that she was feeling good again, no increase in shortness of breath or abdominal bloating.  She states that she had gone to bed on Sunday night, slept well did not wake up with any shortness of breath, when she woke on Monday morning she thought that she was feeling good she  had made her bed had the sudden onset of feeling like she could not get enough air.  And EMS was called.  Went over her diet sounds like she is being very compliant not using salt.  So discussed her fluid intake, which she states she drinks 2 cups of coffee in the morning and then with her meals will have a glass of flavored water, she states that she buys 1 L of the flavored water that will last her all week.  Question asked her if her flavored water and contain sodium, she was not sure but she will check on that when she gets home.  She weighs herself every day first thing in the morning and if she does notice a weight gain as per instructions of Dr. Rockey Situ she takes an extra fluid pill which usually makes her weight go back to normal, 171 pounds is her baseline.  She is taking her medications as ordered.  Denied any feeling of chest pain, chest discomfort or palpitations prior to the episode of shortness of breath.   Spoke with the hospitalist during progression rounds and asked Cardiology should not be involved he felt due to the fact that she is old and this attributed to her admission.   Spent 45 minutes with patient today.  We will continue follow.  Pricilla Riffle RN [facility]

## 2020-06-07 NOTE — Progress Notes (Signed)
Patient became dyspneic while ambulating in the hallway. Oxygen dropped to 82% but recovered to 94% while walking. Patient complained of weakness in both legs. Patient utilized walker for ambulation.Kathryn [name]

## 2020-06-07 NOTE — Progress Notes (Signed)
Mobility Specialist - Progress Note   06/07/20 1400  Mobility  Activity Ambulated in hall  Level of Assistance Modified independent, requires aide device or extra time  [place] wheel walker  Distance Ambulated (ft) 160 ft  Mobility Response Tolerated well  Mobility performed by Mobility specialist  $Mobility charge 1 Mobility    Pt ambulated in hallway with RW for session. Mod-independent. No SOB. Voices weakness in LE during activity. Pt c/o lower back pain that she expresses flares up with mobility. Pain "3/10". Pt independent with toileting/hygiene.    Kathryn [name] Mobility Specialist 06/07/20, 3:00 PM

## 2020-06-08 LAB — BASIC METABOLIC PANEL
Anion gap: 8 (ref 5–15)
BUN: 24 mg/dL — ABNORMAL HIGH (ref 8–23)
CO2: 27 mmol/L (ref 22–32)
Calcium: 9.5 mg/dL (ref 8.9–10.3)
Chloride: 104 mmol/L (ref 98–111)
Creatinine, Ser: 1 mg/dL (ref 0.44–1.00)
GFR, Estimated: 56 mL/min — ABNORMAL LOW (ref >60)
Glucose, Bld: 158 mg/dL — ABNORMAL HIGH (ref 70–99)
Potassium: 4.8 mmol/L (ref 3.5–5.1)
Sodium: 139 mmol/L (ref 135–145)

## 2020-06-08 LAB — GLUCOSE, CAPILLARY: Glucose-Capillary: 267 mg/dL — ABNORMAL HIGH (ref 70–99)

## 2020-06-08 MED ORDER — MELATONIN 5 MG PO TABS
5.0000 mg | ORAL_TABLET | Freq: Every day | ORAL | Status: DC
  Administered 2020-06-08: 5 mg via ORAL
  Filled 2020-06-08: qty 1

## 2020-06-08 NOTE — Discharge Instructions (Signed)
Do not drink more than 1.2 liters per day and continue to weigh yourself daily.  Do not go out without a mask on .   You were cared for by a hospitalist during your hospital stay. If you have any questions about your discharge medications or the care you received while you were in the hospital after you are discharged, you can call the unit and asked to speak with the hospitalist on call if the hospitalist that took care of you is not available. Once you are discharged, your primary care physician will handle any further medical issues.   Please note that NO REFILLS for any discharge medications will be authorized once you are discharged, as it is imperative that you return to your primary care physician (or establish a relationship with a primary care physician if you do not have one) for your aftercare needs so that they can reassess your need for medications and monitor your lab values.  Please take all your medications with you for your next visit with your Primary MD. Please ask your Primary MD to get all Hospital records sent to his/her office. Please request your Primary MD to go over all hospital test results at the follow up.   If you experience worsening of your admission symptoms, develop shortness of breath, chest pain, suicidal or homicidal thoughts or a life threatening emergency, you must seek medical attention immediately by calling 911 or calling your MD.   Dennis Bast must read the complete instructions/literature along with all the possible adverse reactions/side effects for all the medicines you take including new medications that have been prescribed to you. Take new medicines after you have completely understood and accpet all the possible adverse reactions/side effects.    Do not drive when taking pain medications or sedatives.     Do not take more than prescribed Pain, Sleep and Anxiety Medications   If you have smoked or chewed Tobacco in the last 2 yrs please stop. Stop any  regular alcohol  and or recreational drug use.   Wear Seat belts while driving.

## 2020-06-08 NOTE — Discharge Summary (Signed)
Physician Discharge Summary  Kathryn [name] CWC:[id number] DOB: Mar 23, 1934 DOA: 06/06/2020  PCP: Jerrol Banana., MD  Admit date: 06/06/2020 Discharge date: 06/08/2020  Admitted From: home  Disposition:  home    Home Health:  none  Discharge Condition:  stable   CODE STATUS:  Full code   Diet recommendation:  Heart healthy, diabetic Consultations:  none  Procedures/Studies: . none   Discharge Diagnoses:  Principal Problem:   COPD exacerbation ([place]) Active Problems:   Acute on chronic combined systolic and diastolic CHF (congestive heart failure) ([place])   Hyperlipidemia   Essential hypertension   Hypothyroidism   Diabetes mellitus without complication (HCC)   PSVT (paroxysmal supraventricular tachycardia) ([place])     Brief Summary: Patient is a 87 year old female with history of hypertension, hyperlipidemia, diabetes type 2 longer COPD, GERD, hypothyroidism, skin cancer, gastric ulcer, congestive heart failure with ejection fraction of 40 to 45%, former smoker who presents from home with complaints of shortness of breath.  Admitted for COPD and CHF exacerbation.  Recently hospitalized from 3/1 through 3/4 for COPD and CHF exacerbations.  Hospital Course:  Acute exacerbation of COPD -The patient improved rather quickly did not need steroids and is not being discharged home on any  Acute on chronic systolic heart failure -resolved. Given IV Lasix x 2 days and then held- I reviewed instructions for fluid and sodium restriction and daily weights.    Discharge Exam: Vitals:   06/08/20 0750 06/08/20 0858  BP:  (!) 106/50  Pulse:  72  Resp:  17  Temp:  (!) 97.5 F (36.4 C)  SpO2: 92% 94%   Vitals:   06/08/20 0203 06/08/20 0430 06/08/20 0750 06/08/20 0858  BP:  123/70  (!) 106/50  Pulse:  66  72  Resp:  18  17  Temp:  (!) 97.5 F (36.4 C)  (!) 97.5 F (36.4 C)  TempSrc:  Oral  Oral  SpO2:  96% 92% 94%  Weight: 78 kg     Height:        General: Pt is  alert, awake, not in acute distress Cardiovascular: RRR, S1/S2 +, no rubs, no gallops Respiratory: CTA bilaterally, no wheezing, no rhonchi Abdominal: Soft, NT, ND, bowel sounds + Extremities: no edema, no cyanosis   Discharge Instructions  Discharge Instructions    (HEART FAILURE PATIENTS) Call MD:  Anytime you have any of the following symptoms: 1) 3 pound weight gain in 24 hours or 5 pounds in 1 week 2) shortness of breath, with or without a dry hacking cough 3) swelling in the hands, feet or stomach 4) if you have to sleep on extra pillows at night in order to breathe.   Complete by: As directed    Diet - low sodium heart healthy   Complete by: As directed    Increase activity slowly   Complete by: As directed      Allergies as of 06/08/2020      Reactions   Oxycodone Other (See Comments)   Mental status change   Prednisone    swelling, bruising.      Medication List    TAKE these medications   acetaminophen 500 MG tablet Commonly known as: TYLENOL Take 1,000 mg by mouth every 6 (six) hours as needed. Pain   albuterol (2.5 MG/3ML) 0.083% nebulizer solution Commonly known as: PROVENTIL Take 3 mLs (2.5 mg total) by nebulization every 6 (six) hours as needed for wheezing or shortness of breath.   amiodarone 200 MG  tablet Commonly known as: PACERONE Take 1 tablet (200 mg) by mouth one daily as directed   aspirin EC 81 MG tablet Take 1 tablet (81 mg total) by mouth daily. Swallow whole.   atorvastatin 40 MG tablet Commonly known as: LIPITOR Take 1 tablet (40 mg total) by mouth daily.   carvedilol 6.25 MG tablet Commonly known as: COREG Take 1 tablet (6.25 mg total) by mouth 2 (two) times daily with a meal.   glucose blood test strip Commonly known as: Contour Next Test Check sugar once daily  DX E11.9   hydrOXYzine 10 MG tablet Commonly known as: ATARAX/VISTARIL TAKE 1 TO 2 TABLETS(10 TO 20 MG) BY MOUTH EVERY 6 HOURS AS NEEDED What changed: See the new  instructions.   levothyroxine 100 MCG tablet Commonly known as: SYNTHROID TAKE 1 TABLET(100 MCG) BY MOUTH DAILY BEFORE BREAKFAST What changed: See the new instructions.   losartan 25 MG tablet Commonly known as: COZAAR Take 1 tablet (25 mg total) by mouth every evening.   metFORMIN 1000 MG tablet Commonly known as: GLUCOPHAGE TAKE 1 TABLET(1000 MG) BY MOUTH DAILY WITH SUPPER What changed: See the new instructions.   omeprazole 20 MG capsule Commonly known as: PRILOSEC TAKE 1 CAPSULE(20 MG) BY MOUTH DAILY What changed: See the new instructions.   tiotropium 18 MCG inhalation capsule Commonly known as: SPIRIVA Place 1 capsule (18 mcg total) into inhaler and inhale daily as needed.   torsemide 20 MG tablet Commonly known as: DEMADEX Take 20 mg po daily in AM. Take one extra dose 20mg  PRN 3lb weight gain or abdominal fullness.       Allergies  Allergen Reactions  . Oxycodone Other (See Comments)    Mental status change  . Prednisone     swelling, bruising.      DG Chest 2 View  Result Date: 06/06/2020 CLINICAL DATA:  Short of breath EXAM: CHEST - 2 VIEW COMPARISON:  Chest x-ray and CT chest 05/24/2020 FINDINGS: COPD with hyperinflation lungs.  Prominent lung markings. Heart size mildly enlarged. Atherosclerotic aortic arch. Small bilateral effusions and bibasilar atelectasis/infiltrate unchanged from the prior study. No definite pulmonary edema. IMPRESSION: Stable chest x-ray. COPD. Small bilateral pleural effusions and bibasilar atelectasis/infiltrate unchanged from 05/24/2020 Electronically Signed   By: Franchot Gallo M.D.   On: 06/06/2020 10:18   CT Angio Chest PE W and/or Wo Contrast  Result Date: 05/24/2020 CLINICAL DATA:  PE suspected, high probability. Shortness of breath for 3 days. Progressive symptoms this morning. EXAM: CT ANGIOGRAPHY CHEST WITH CONTRAST TECHNIQUE: Multidetector CT imaging of the chest was performed using the standard protocol during bolus  administration of intravenous contrast. Multiplanar CT image reconstructions and MIPs were obtained to evaluate the vascular anatomy. CONTRAST:  21mL OMNIPAQUE IOHEXOL 350 MG/ML SOLN COMPARISON:  One-view chest x-ray 05/24/2020. CT chest with contrast 10/22/2012 FINDINGS: Cardiovascular: Heart size is normal. Atherosclerotic calcifications are present at the aortic arch great vessel origins. No aneurysm is present. Atherosclerotic calcifications are present in the coronary arteries. Pulmonary artery opacification is excellent. No focal filling defects are present to suggest pulmonary emboli. Study is mildly degraded by breathing motion. Pulmonary artery size is normal. Mediastinum/Nodes: Subcentimeter paratracheal and prevascular lymph nodes are likely reactive. Is slightly larger, but were present on the prior study. The esophagus is within normal limits. Thoracic inlet is unremarkable. Lungs/Pleura: Bilateral pleural effusions are present. Dependent airspace disease is associated. No other focal nodule, mass, or airspace disease is present. Airways are patent. Breathing motion is  noted. Upper Abdomen: Visualized upper abdomen is remarkable for atherosclerotic change. No solid organ lesions are present. Musculoskeletal: Vertebral body heights and alignment are normal. No significant listhesis is present. Ribs are within normal limits bilaterally. Advanced degenerative changes are noted at both shoulders. Review of the MIP images confirms the above findings. IMPRESSION: 1. No pulmonary embolus. 2. Bilateral pleural effusions with associated dependent airspace disease. Findings are consistent with congestive heart failure. Infection is considered less likely. 3. Coronary artery disease. 4. Aortic Atherosclerosis (ICD10-I70.0). Electronically Signed   By: San Morelle M.D.   On: 05/24/2020 11:30   US Venous Img Lower Bilateral (DVT)  Result Date: 05/25/2020 CLINICAL DATA:  Positive D-dimer, varicose veins  EXAM: BILATERAL LOWER EXTREMITY VENOUS DOPPLER ULTRASOUND TECHNIQUE: Gray-scale sonography with compression, as well as color and duplex ultrasound, were performed to evaluate the deep venous system(s) from the level of the common femoral vein through the popliteal and proximal calf veins. COMPARISON:  None. FINDINGS: VENOUS Normal compressibility of the common femoral, superficial femoral, and popliteal veins, as well as the visualized calf veins. Visualized portions of profunda femoral vein and great saphenous vein unremarkable. No filling defects to suggest DVT on grayscale or color Doppler imaging. Doppler waveforms show normal direction of venous flow, normal respiratory phasicity and response to augmentation. OTHER None. Limitations: none IMPRESSION: No femoropopliteal DVT nor evidence of DVT within the visualized calf veins. If clinical symptoms are inconsistent or if there are persistent or worsening symptoms, further imaging (possibly involving the iliac veins) may be warranted. Electronically Signed   By: Lucrezia Europe M.D.   On: 05/25/2020 10:19   DG Chest Port 1 View  Result Date: 05/24/2020 CLINICAL DATA:  Possible sepsis Shortness of breath for 3 days EXAM: PORTABLE CHEST 1 VIEW COMPARISON:  03/26/2020 FINDINGS: Heart size at upper limits of normal. Mild pulmonary vascular congestion again seen. There has been interval improvement in aeration of the lungs, however focal opacity still remains at the right lung base suspicious for pneumonia. Advanced degenerative changes of the glenohumeral joints bilaterally. IMPRESSION: Patchy residual opacity at the right lung base suspicious for pneumonia. Electronically Signed   By: Miachel Roux M.D.   On: 05/24/2020 08:20     The results of significant diagnostics from this hospitalization (including imaging, microbiology, ancillary and laboratory) are listed below for reference.     Microbiology: Recent Results (from the past 240 hour(s))  SARS CORONAVIRUS  2 (TAT 6-24 HRS) Nasopharyngeal Nasopharyngeal Swab     Status: None   Collection Time: 06/06/20  1:59 PM   Specimen: Nasopharyngeal Swab  Result Value Ref Range Status   SARS Coronavirus 2 NEGATIVE NEGATIVE Final    Comment: (NOTE) SARS-CoV-2 target nucleic acids are NOT DETECTED.  The SARS-CoV-2 RNA is generally detectable in upper and lower respiratory specimens during the acute phase of infection. Negative results do not preclude SARS-CoV-2 infection, do not rule out co-infections with other pathogens, and should not be used as the sole basis for treatment or other patient management decisions. Negative results must be combined with clinical observations, patient history, and epidemiological information. The expected result is Negative.  Fact Sheet for Patients: SugarRoll.be  Fact Sheet for Healthcare Providers: https://www.woods-mathews.com/  This test is not yet approved or cleared by the [place] FDA and  has been authorized for detection and/or diagnosis of SARS-CoV-2 by FDA under an Emergency Use Authorization (EUA). This EUA will remain  in effect (meaning this test can be used) for the duration  of the COVID-19 declaration under Se ction 564(b)(1) of the Act, 21 U.S.C. section 360bbb-3(b)(1), unless the authorization is terminated or revoked sooner.  Performed at [facility], [place] [address], [place], [place] [address]      Labs: BNP (last 3 results) Recent Labs    03/26/20 2329 05/24/20 0747 06/06/20 0930  BNP 337.8* 371.5* 784.6*   Basic Metabolic Panel: Recent Labs  Lab 06/06/20 0930 06/07/20 0600 06/08/20 0715  NA 140 139 139  K 4.2 4.9 4.8  CL 106 101 104  CO2 26 28 27   GLUCOSE 228* 147* 158*  BUN 20 24* 24*  CREATININE 0.91 1.12* 1.00  CALCIUM 9.3 9.7 9.5  MG  --  1.9  --    Liver Function Tests: Recent Labs  Lab 06/06/20 0930  AST 19  ALT 17  ALKPHOS 66  BILITOT 0.7  PROT 7.2   ALBUMIN 4.0   No results for input(s): LIPASE, AMYLASE in the last 168 hours. No results for input(s): AMMONIA in the last 168 hours. CBC: Recent Labs  Lab 06/06/20 0930 06/07/20 0600  WBC 8.7 7.0  NEUTROABS 6.5  --   HGB 11.7* 11.4*  HCT 40.2 37.4  MCV 85.2 83.3  PLT 308 263   Cardiac Enzymes: No results for input(s): CKTOTAL, CKMB, CKMBINDEX, TROPONINI in the last 168 hours. BNP: Invalid input(s): POCBNP CBG: Recent Labs  Lab 06/07/20 0905 06/07/20 1149 06/07/20 1716 06/07/20 2050 06/08/20 0858  GLUCAP 199* 163* 140* 144* 267*   D-Dimer No results for input(s): DDIMER in the last 72 hours. Hgb A1c No results for input(s): HGBA1C in the last 72 hours. Lipid Profile No results for input(s): CHOL, HDL, LDLCALC, TRIG, CHOLHDL, LDLDIRECT in the last 72 hours. Thyroid function studies No results for input(s): TSH, T4TOTAL, T3FREE, THYROIDAB in the last 72 hours.  Invalid input(s): FREET3 Anemia work up No results for input(s): VITAMINB12, FOLATE, FERRITIN, TIBC, IRON, RETICCTPCT in the last 72 hours. Urinalysis    Component Value Date/Time   COLORURINE STRAW (A) 05/24/2020 0746   APPEARANCEUR CLEAR (A) 05/24/2020 0746   APPEARANCEUR Clear 06/23/2014 1357   LABSPEC 1.016 05/24/2020 0746   LABSPEC 1.020 06/23/2014 1357   PHURINE 5.0 05/24/2020 0746   GLUCOSEU 150 (A) 05/24/2020 0746   GLUCOSEU Negative 06/23/2014 1357   HGBUR NEGATIVE 05/24/2020 0746   BILIRUBINUR NEGATIVE 05/24/2020 0746   BILIRUBINUR Negative 01/19/2020 1112   BILIRUBINUR Negative 06/23/2014 1357   KETONESUR NEGATIVE 05/24/2020 0746   PROTEINUR NEGATIVE 05/24/2020 0746   UROBILINOGEN 0.2 01/19/2020 1112   NITRITE NEGATIVE 05/24/2020 0746   LEUKOCYTESUR SMALL (A) 05/24/2020 0746   LEUKOCYTESUR 1+ 06/23/2014 1357   Sepsis Labs Invalid input(s): PROCALCITONIN,  WBC,  LACTICIDVEN Microbiology Recent Results (from the past 240 hour(s))  SARS CORONAVIRUS 2 (TAT 6-24 HRS) Nasopharyngeal  Nasopharyngeal Swab     Status: None   Collection Time: 06/06/20  1:59 PM   Specimen: Nasopharyngeal Swab  Result Value Ref Range Status   SARS Coronavirus 2 NEGATIVE NEGATIVE Final    Comment: (NOTE) SARS-CoV-2 target nucleic acids are NOT DETECTED.  The SARS-CoV-2 RNA is generally detectable in upper and lower respiratory specimens during the acute phase of infection. Negative results do not preclude SARS-CoV-2 infection, do not rule out co-infections with other pathogens, and should not be used as the sole basis for treatment or other patient management decisions. Negative results must be combined with clinical observations, patient history, and epidemiological information. The expected result is Negative.  Fact Sheet for Patients: SugarRoll.be  Fact Sheet for Healthcare Providers: https://www.woods-mathews.com/  This test is not yet approved or cleared by the [place] FDA and  has been authorized for detection and/or diagnosis of SARS-CoV-2 by FDA under an Emergency Use Authorization (EUA). This EUA will remain  in effect (meaning this test can be used) for the duration of the COVID-19 declaration under Se ction 564(b)(1) of the Act, 21 U.S.C. section 360bbb-3(b)(1), unless the authorization is terminated or revoked sooner.  Performed at [facility], [place] [address] [place], [place] [address]      Time coordinating discharge in minutes: 65  SIGNED:   Debbe Odea, MD  [facility] 06/08/2020, 9:39 AM

## 2020-06-08 NOTE — Progress Notes (Signed)
   Heart Failure Nurse Navigator Note  HFrEF due to 45%.  Presented to the emergency room by way of EMS with sudden onset of shortness of breath.  Comorbidities:  COPD/emphysema Hyperlipidemia Coronary artery disease Arthritis Diabetes Hypertension  Zio patch worn in January 2022 revealed PSVT.  The patient was started on amiodarone.  She has a chronic left bundle branch block.   Medications:  Amiodarone 200 mg daily next aspirin 81 mg daily Atorvastatin 40 mg daily Coreg 6.25 mg 2 times a day with meals Furosemide 40 mg Cozaar 25 mg in the evening    Labs:  Sodium 139, potassium 4.8, chloride 104, CO2 27, BUN 24, creatinine 1 Intake 243 mL Output not documented Weight 78 kg  Assessment:  General-she is awake and alert, dressed and is being discharged.  HEENT-wears glasses, pupils are equal.  Cardiac-heart tones are regular rate and rhythm.  Chest-breath sounds are clear to posterior auscultation   Lower extremities-there is no lower extremity edema noted  Psych-is pleasant and appropriate makes good eye contact.  Neurologic-speech is clear moves all extremities without difficulty.    Spoke with patient today before her discharge, enforced that if she has any questions or concerns either call her physician or Darylene Price in the heart failure clinic, also stressed the importance even if she is feeling good to keep that appointment with with [name] next week.  Also asked her to check the sodium content on the flavored water that she is drinking.  Along with doing her daily weights and recording.  She had no further questions at this time.   Pricilla Riffle RN CH[facility]

## 2020-06-09 ENCOUNTER — Telehealth: Payer: Self-pay

## 2020-06-09 NOTE — Telephone Encounter (Signed)
No HFU scheduled at this time. 

## 2020-06-09 NOTE — Telephone Encounter (Signed)
   24 hour post discharge phone call  Called and spoke to [facility].  She states she is doing well.  Weight is steady at 171#, which is her normal weight.    There were no changes in her medication regime.  She checked the flavored water she drinks and it does not contain sodium.  She is continuing to monitor her fluid intake.  She has follow up appointment with Darylene Price on the 23 of march and is planning on keeping it.  She does not have any testing or up coming blood work ordered.  Instructed to call if she has questions or concerns.  [place]

## 2020-06-09 NOTE — Telephone Encounter (Signed)
Transition Care Management Follow-up Telephone Call  Date of discharge and from where: [facility] on 06/08/20  How have you been since you were released from the hospital? "Doing great and has had a good day." Pt states that she is breathing good and has had no SOB since d/c. Pt does not have a pulse ox to check Sp02%. Pt states she slept good last night and her appetite is good. Declined pain, chest tightness, swelling, fever or n/v/d.  Any questions or concerns? Yes, pt would like to know what is causing these breathing attacks and how to prevent these episodes.  Items Reviewed:  Did the pt receive and understand the discharge instructions provided? Yes   Medications obtained and verified? No, declined reviewing medications at this time. No changes were made to meds.   Any new allergies since your discharge? No   Dietary orders reviewed? Yes  Do you have support at home? Yes   Other (ie: DME, Home Health, etc): N/A  Functional Questionnaire: (I = Independent and D = Dependent)  Bathing/Dressing- I   Meal Prep- I  Eating- I  Maintaining continence- I  Transferring/Ambulation- I  Managing Meds- I   Follow up appointments reviewed:    PCP Hospital f/u appt confirmed? No, pt declined scheduling a HFU and would like to wait until her 19m f/u on 07/04/20.   [facility] f/u appt confirmed? Yes    Are transportation arrangements needed? No   If their condition worsens, is the pt aware to call  their PCP or go to the ED? Yes  Was the patient provided with contact information for the PCP's office or ED? Yes  Was the pt encouraged to call back with questions or concerns? Yes

## 2020-06-15 ENCOUNTER — Other Ambulatory Visit: Payer: Self-pay

## 2020-06-15 ENCOUNTER — Encounter: Payer: Self-pay | Admitting: Family

## 2020-06-15 ENCOUNTER — Ambulatory Visit: Payer: Medicare Other | Attending: Family | Admitting: Family

## 2020-06-15 VITALS — BP 111/56 | HR 71 | Resp 18 | Ht 64.0 in | Wt 172.0 lb

## 2020-06-15 DIAGNOSIS — Z79899 Other long term (current) drug therapy: Secondary | ICD-10-CM | POA: Insufficient documentation

## 2020-06-15 DIAGNOSIS — E785 Hyperlipidemia, unspecified: Secondary | ICD-10-CM | POA: Diagnosis not present

## 2020-06-15 DIAGNOSIS — Z8249 Family history of ischemic heart disease and other diseases of the circulatory system: Secondary | ICD-10-CM | POA: Insufficient documentation

## 2020-06-15 DIAGNOSIS — Z7982 Long term (current) use of aspirin: Secondary | ICD-10-CM | POA: Insufficient documentation

## 2020-06-15 DIAGNOSIS — Z79890 Hormone replacement therapy (postmenopausal): Secondary | ICD-10-CM | POA: Diagnosis not present

## 2020-06-15 DIAGNOSIS — Z7984 Long term (current) use of oral hypoglycemic drugs: Secondary | ICD-10-CM | POA: Insufficient documentation

## 2020-06-15 DIAGNOSIS — Z87891 Personal history of nicotine dependence: Secondary | ICD-10-CM | POA: Insufficient documentation

## 2020-06-15 DIAGNOSIS — E1151 Type 2 diabetes mellitus with diabetic peripheral angiopathy without gangrene: Secondary | ICD-10-CM | POA: Insufficient documentation

## 2020-06-15 DIAGNOSIS — I471 Supraventricular tachycardia: Secondary | ICD-10-CM | POA: Diagnosis not present

## 2020-06-15 DIAGNOSIS — Z885 Allergy status to narcotic agent status: Secondary | ICD-10-CM | POA: Insufficient documentation

## 2020-06-15 DIAGNOSIS — J449 Chronic obstructive pulmonary disease, unspecified: Secondary | ICD-10-CM | POA: Diagnosis not present

## 2020-06-15 DIAGNOSIS — E119 Type 2 diabetes mellitus without complications: Secondary | ICD-10-CM

## 2020-06-15 DIAGNOSIS — I10 Essential (primary) hypertension: Secondary | ICD-10-CM

## 2020-06-15 DIAGNOSIS — I110 Hypertensive heart disease with heart failure: Secondary | ICD-10-CM | POA: Diagnosis not present

## 2020-06-15 DIAGNOSIS — G8929 Other chronic pain: Secondary | ICD-10-CM | POA: Insufficient documentation

## 2020-06-15 DIAGNOSIS — I5022 Chronic systolic (congestive) heart failure: Secondary | ICD-10-CM

## 2020-06-15 DIAGNOSIS — Z8711 Personal history of peptic ulcer disease: Secondary | ICD-10-CM | POA: Insufficient documentation

## 2020-06-15 DIAGNOSIS — E039 Hypothyroidism, unspecified: Secondary | ICD-10-CM | POA: Insufficient documentation

## 2020-06-15 DIAGNOSIS — Z888 Allergy status to other drugs, medicaments and biological substances status: Secondary | ICD-10-CM | POA: Diagnosis not present

## 2020-06-15 NOTE — Patient Instructions (Signed)
Continue weighing daily and call for an overnight weight gain of > 2 pounds or a weekly weight gain of >5 pounds. 

## 2020-06-15 NOTE — Progress Notes (Signed)
Patient ID: Kathryn [name], female    DOB: 1934-02-12, 87 y.o.   MRN: [id number]  HPI  Kathryn [name] is a 87 y/o female with a history of atrial fibrillation, DM, hyperlipidemia, HTN, thyroid disease, PVD, gastric ulcer, COPD, previous tobacco use and chronic heart failure.   Echo report from 03/28/20 reviewed and showed an EF of 40-45% along with mild LAE and mild MR/ AR.   Admitted 06/06/20 due to HF/ COPD exacerbation. Initially given IV lasix with transition to oral diuretics. Discharged after 2 days. Admitted 05/24/20 due to HF/ COPD exacerbation. Cardiology consult obtained. Initially needed bipap. Diuresed and symptoms improved. Discharged after 3 days. Admitted 03/26/20 due to new onset HF. Initially needed bipap but then able to be weaned down to 3L nasal cannula. Cardiology consult obtained. Initially given IV lasix with transition to oral diuretics. Discharged after 3 days.   She presents today for a follow-up visit with a chief complaint of mild shortness of breath upon moderate exertion. She describes this as chronic in nature having been present for several years although she says that her breathing has improved greatly since her recent admission. She has associated fatigue, pedal edema, anxiety, difficulty sleeping and chronic pain along with this. She denies any abdominal distention, palpitations, chest pain, cough, dizziness or weight gain.   She says that she feels quite well today but is concerned because her shortness of breath tends to come on "all of a sudden" at night.   Past Medical History:  Diagnosis Date  . Actinic keratosis   . Aortic atherosclerosis ([place]) 05/25/2020  . Arthritis    spinal stenosis  . Cardiomyopathy - presumed to be nonischemic    a. 03/2020 Echo: EF 40-45%, gr2 DD. Nl RV fxn. Mildly dil LA. Mild MR/ao sclerosis; b. 03/2020 MV: EF 30-44%, no ischemia. Cor Ca2+ in pLAD.  Marland Kitchen CHF (congestive heart failure) ([place])   . COPD (chronic obstructive pulmonary disease) ([place])    . Coronary artery calcification seen on CT scan    a. 03/2020 MV: CT attenuation images show Ao and mod pLAD Ca2+.  . Diabetes mellitus without complication ([place])   . Gastric ulcer 4/12   treated with Prolisec- states no problems now  . Hyperlipidemia   . Hypertension    PCP Dr Miguel Aschoff   [place]  . Hypothyroidism   . Left bundle branch block (LBBB)   . Neuromuscular disorder ([place])    slight numbness right toes- comes and goes  . Peripheral vascular disease (HCC)    varicose veins left leg  . Pneumonia   . PSVT (paroxysmal supraventricular tachycardia) ([place])    a. 03/2020 Zio: Avg HR 87 (66-211).  4 beats NSVT.  43 PSVT episodes, longest 10h 73m @ avg of 153 bpm (max 211).  Seen by EP-->amio started (pt wished to avoid EPS/RFCA).  . Seasonal allergies   . Shortness of breath   . Skin cancer    multiple from face  . Squamous cell carcinoma of skin 03/23/2013   L dorsal hand  . Squamous cell carcinoma of skin 02/04/2014   R forearm/in situ, L pretibial  . Squamous cell carcinoma of skin 09/24/2018   R thumb   Past Surgical History:  Procedure Laterality Date  . BACK SURGERY     4/12  Dr Shellia Carwin- for lumbar stenosis  . BREAST CYST ASPIRATION Left 1965   negative  . BREAST SURGERY  1962   lumpectomy with biopsy   left  .  CARPAL TUNNEL RELEASE     right  . COLONOSCOPY    . COLONOSCOPY WITH PROPOFOL N/A 06/30/2015   Procedure: COLONOSCOPY WITH PROPOFOL;  Surgeon: Hulen Luster, MD;  Location: [facility] ENDOSCOPY;  Service: Gastroenterology;  Laterality: N/A;  . COLONOSCOPY WITH PROPOFOL N/A 01/22/2017   Procedure: COLONOSCOPY WITH PROPOFOL;  Surgeon: Lollie Sails, MD;  Location: [facility] ENDOSCOPY;  Service: Endoscopy;  Laterality: N/A;  . COLONOSCOPY WITH PROPOFOL N/A 01/28/2019   Procedure: COLONOSCOPY WITH PROPOFOL;  Surgeon: Toledo, Benay Pike, MD;  Location: [facility] ENDOSCOPY;  Service: Gastroenterology;  Laterality: N/A;  . ESOPHAGOGASTRODUODENOSCOPY    .  ESOPHAGOGASTRODUODENOSCOPY (EGD) WITH PROPOFOL N/A 01/28/2019   Procedure: ESOPHAGOGASTRODUODENOSCOPY (EGD) WITH PROPOFOL;  Surgeon: Toledo, Benay Pike, MD;  Location: [facility] ENDOSCOPY;  Service: Gastroenterology;  Laterality: N/A;  . EYE SURGERY     cataract extraction with IOL bilaterally  . JOINT REPLACEMENT    . LUMBAR LAMINECTOMY/DECOMPRESSION MICRODISCECTOMY  05/24/2011   Procedure: LUMBAR LAMINECTOMY/DECOMPRESSION MICRODISCECTOMY 2 LEVELS;  Surgeon: Magnus Sinning, MD;  Location: [facility] ORS;  Service: Orthopedics;  Laterality: N/A;  L2-L3, L3-L4 (x-ray)  . RIGHT OOPHORECTOMY     due to STAPH INFECTION   Family History  Problem Relation Age of Onset  . Dementia Mother   . Lung cancer Father   . Heart disease Father   . Hypertension Sister   . Breast cancer Maternal Aunt   . Arthritis Son    Social History   Tobacco Use  . Smoking status: Former Smoker    Packs/day: 0.50    Years: 50.00    Pack years: 25.00    Types: Cigarettes    Quit date: 05/16/2004    Years since quitting: 16.0  . Smokeless tobacco: Never Used  Substance Use Topics  . Alcohol use: Yes    Comment: socially-1 glass of wine or [place] icea tea once a month   Allergies  Allergen Reactions  . Oxycodone Other (See Comments)    Mental status change  . Prednisone     swelling, bruising.   Prior to Admission medications   Medication Sig Start Date End Date Taking? Authorizing Provider  acetaminophen (TYLENOL) 500 MG tablet Take 1,000 mg by mouth every 6 (six) hours as needed. Pain    Yes [provider]  albuterol (PROVENTIL) (2.5 MG/3ML) 0.083% nebulizer solution Take 3 mLs (2.5 mg total) by nebulization every 6 (six) hours as needed for wheezing or shortness of breath. 04/05/20  Yes Jerrol Banana., MD  amiodarone (PACERONE) 200 MG tablet Take 1 tablet (200 mg) by mouth one daily as directed 05/11/20  Yes Vickie Epley, MD  aspirin EC 81 MG tablet Take 1 tablet (81 mg total) by mouth  daily. Swallow whole. 04/18/20  Yes Visser, Jacquelyn D, PA-C  atorvastatin (LIPITOR) 40 MG tablet Take 1 tablet (40 mg total) by mouth daily. 04/18/20  Yes Minna Merritts, MD  carvedilol (COREG) 6.25 MG tablet Take 1 tablet (6.25 mg total) by mouth 2 (two) times daily with a meal. 04/27/20 04/22/21 Yes Visser, Jacquelyn D, PA-C  glucose blood (CONTOUR NEXT TEST) test strip Check sugar once daily  DX E11.9 11/16/16  Yes Jerrol Banana., MD  hydrOXYzine (ATARAX/VISTARIL) 10 MG tablet TAKE 1 TO 2 TABLETS(10 TO 20 MG) BY MOUTH EVERY 6 HOURS AS NEEDED Patient taking differently: Take 10 mg by mouth every 6 (six) hours as needed for itching or anxiety. 04/26/20  Yes Jerrol Banana., MD  levothyroxine (SYNTHROID) 100 MCG tablet TAKE 1 TABLET(100 MCG) BY MOUTH DAILY BEFORE BREAKFAST Patient taking differently: Take 100 mcg by mouth daily before breakfast. TAKE 1 TABLET(100 MCG) BY MOUTH DAILY BEFORE BREAKFAST 08/17/19  Yes Jerrol Banana., MD  losartan (COZAAR) 25 MG tablet Take 1 tablet (25 mg total) by mouth every evening. 03/29/20  Yes Fritzi Mandes, MD  metFORMIN (GLUCOPHAGE) 1000 MG tablet TAKE 1 TABLET(1000 MG) BY MOUTH DAILY WITH SUPPER Patient taking differently: Take 1,000 mg by mouth daily with supper. TAKE 1 TABLET(1000 MG) BY MOUTH DAILY WITH SUPPER 05/08/20  Yes Jerrol Banana., MD  omeprazole (PRILOSEC) 20 MG capsule TAKE 1 CAPSULE(20 MG) BY MOUTH DAILY Patient taking differently: Take 20 mg by mouth daily. 05/31/20  Yes Jerrol Banana., MD  tiotropium (SPIRIVA) 18 MCG inhalation capsule Place 1 capsule (18 mcg total) into inhaler and inhale daily as needed. 04/05/20  Yes Jerrol Banana., MD  torsemide (DEMADEX) 20 MG tablet Take 20 mg po daily in AM. Take one extra dose 20mg  PRN 3lb weight gain or abdominal fullness. 05/27/20  Yes Samuella Cota, MD    Review of Systems  Constitutional: Positive for fatigue. Negative for appetite change.  HENT: Negative for  congestion, rhinorrhea and sore throat.   Eyes: Negative.   Respiratory: Positive for shortness of breath. Negative for cough and chest tightness.   Cardiovascular: Positive for leg swelling. Negative for chest pain and palpitations.  Gastrointestinal: Negative for abdominal distention and abdominal pain.  Endocrine: Negative.   Genitourinary: Negative.   Musculoskeletal: Positive for arthralgias (shoulders) and back pain.  Skin: Negative.   Allergic/Immunologic: Negative.   Neurological: Negative for dizziness and light-headedness.  Hematological: Negative for adenopathy. Does not bruise/bleed easily.  Psychiatric/Behavioral: Positive for sleep disturbance (sleeping on 1-2 pillows). Negative for dysphoric mood. The patient is nervous/anxious.    Vitals:   06/15/20 1335  BP: (!) 111/56  Pulse: 71  Resp: 18  SpO2: 98%  Weight: 172 lb (78 kg)  Height: 5\' 4"  (1.626 m)   Wt Readings from Last 3 Encounters:  06/15/20 172 lb (78 kg)  06/08/20 171 lb 14.4 oz (78 kg)  05/27/20 175 lb (79.4 kg)   Lab Results  Component Value Date   CREATININE 1.00 06/08/2020   CREATININE 1.12 (H) 06/07/2020   CREATININE 0.91 06/06/2020    Physical Exam Vitals and nursing note reviewed. Exam conducted with a chaperone present.  Constitutional:      Appearance: She is well-developed.  HENT:     Head: Normocephalic and atraumatic.  Neck:     Vascular: No JVD.  Cardiovascular:     Rate and Rhythm: Normal rate and regular rhythm.  Pulmonary:     Effort: Pulmonary effort is normal. No respiratory distress.     Breath sounds: No wheezing or rales.  Abdominal:     Palpations: Abdomen is soft.     Tenderness: There is no abdominal tenderness.  Musculoskeletal:     Cervical back: Neck supple.     Right lower leg: No tenderness. Edema (trace pitting) present.     Left lower leg: No tenderness. Edema (trace pitting) present.  Skin:    General: Skin is warm and dry.  Neurological:     General: No  focal deficit present.     Mental Status: She is alert and oriented to person, place, and time.  Psychiatric:        Mood and Affect: Mood normal.  Behavior: Behavior normal.    Assessment & Plan:  1: Chronic heart failure with reduced ejection fraction- - NYHA class II - euvolemic today - weighing daily and reminded to call for an overnight weight gain of > 2 pounds or a weekly weight gain of >5 pounds - weight down 3 pounds from last visit here 1 month ago - not adding much salt now and says that she's getting used to low sodium foods; she admits that she could do even better but that she's trying; encouraged her to continue to read food labels for sodium content and keep daily sodium content to < 2000mg  / day - saw [facility] cardiology Rockey Situ) 04/18/20; returns April - wearing compression socks daily - BNP 06/06/20 was 361.8 - reports receiving 3 covid vaccines (including booster) - has received her flu vaccine for this season - discussed paramedic program and patient is quite interested in anything that could help keep her out of the hospital; brochure given and referral placed  2: HTN- - BP looks good today - saw PCP Rosanna Randy) 04/05/20; returns April - BMP 06/08/20 reviewed and showed sodium 139, potassium 4.8, creatinine 1.00 and GFR 56  3: Diabetes- - A1c 03/27/20 was 6.9%  4: PSVT- - saw EP Quentin Ore) 05/11/20 due to SVT on zio monitor - amiodarone was started - possible ablation in the future if needed   Patient did not bring her medications nor a list. Each medication was verbally reviewed with the patient and she was encouraged to bring the bottles to every visit to confirm accuracy of list.  Return in 3 months or sooner for any questions/problems before then.

## 2020-06-21 ENCOUNTER — Telehealth (HOSPITAL_COMMUNITY): Payer: Self-pay

## 2020-06-21 NOTE — Telephone Encounter (Signed)
Received a referral to [employer] program from HF clinic, attempted to contact to set up home visit.  No answer, left message.   [place] [phone]

## 2020-06-22 ENCOUNTER — Other Ambulatory Visit (HOSPITAL_COMMUNITY): Payer: Self-pay

## 2020-06-22 ENCOUNTER — Encounter (HOSPITAL_COMMUNITY): Payer: Self-pay

## 2020-06-22 NOTE — Progress Notes (Signed)
Had a home visit with Kathryn [name]  This is first visit, discussed the program and she wants to be part of it.  She does not want a lot of visits or phone calls.  We can play it by the way she is doing.  She agreed.  We discussed heart failure and described it where she could understand it better, we discussed it such a harsh sounding word.  Discussed sleeping up on pillows or a wedge, she states usually in early morning is where she is more short of breath, could be from laying flat.  She states she feels fluid in her chest in belly when she knows she is in trouble.  Discussed wearing hose or taller socks, she had on ankle socks, she has some edema in ankle areas.  Discussed how wearing compression hose and elevating her legs helps the heart to be able to push the fluid around.  Discussed her medications and verified them.  She is able to fill her med box up.  Discussed taking her levothyroxine atleast 1 hour prior to eating, she had not been waiting to eat.  Discussed green, yellow and red zones and gave handout for them.  She appeared to understand them.  She is aware of up coming appts.  Gave her my info to call with any troubles.  Advised her to call with any troubles regarding not feeling well, weight gain or loss, and not able to get medications.  She appeared to understand.  Lungs were clear nad abdomen soft.  She denies any chest pain, headaches or dizziness.  She was very pleasant and a joy to visit.  She was appreciative of the visit she stated.  Will visit for heart failure, diet and medication compliance.   [name] [phone]

## 2020-07-01 NOTE — Progress Notes (Signed)
Established patient visit   Patient: Kathryn [name]   DOB: 1933/11/20   87 y.o. Female  MRN: [id number] Visit Date: 07/04/2020  Today's healthcare provider: [name], MD   Chief Complaint  Patient presents with  . Hypertension  . Hyperlipidemia  . COPD   I,Porsha C McClurkin,acting as a scribe for [name], MD.,have documented all relevant documentation on the behalf of [name], MD,as directed by  [name], MD while in the presence of [name], MD.  Subjective    HPI  Patient comes in today for follow-up after recent hospitalizations for acute on chronic CHF. Overall she is breathing okay between episodes.  She has less active due to chronic arthritic pains. Hypertension, follow-up  BP Readings from Last 3 Encounters:  07/04/20 114/66  06/22/20 120/70  06/15/20 (!) 111/56   Wt Readings from Last 3 Encounters:  07/04/20 179 lb (81.2 kg)  06/22/20 171 lb (77.6 kg)  06/15/20 172 lb (78 kg)     She was last seen for hypertension 3 months ago.  BP at that visit was 132/75. Management since that visit includes no medication changes.  She reports good compliance with treatment. She is not having side effects.  She is following a Regular, Low Sodium diet. She is not exercising. She does not smoke.  Use of agents associated with hypertension: none.   Outside blood pressures are not being checked. Symptoms: No chest pain No chest pressure  No palpitations No syncope  No dyspnea No orthopnea  No paroxysmal nocturnal dyspnea Yes lower extremity edema   Pertinent labs: Lab Results  Component Value Date   CHOL 294 (H) 04/13/2020   HDL 50 04/13/2020   LDLCALC 196 (H) 04/13/2020   TRIG 242 (H) 04/13/2020   CHOLHDL 5.9 04/13/2020   Lab Results  Component Value Date   NA 139 06/08/2020   K 4.8 06/08/2020   CREATININE 1.00 06/08/2020   GFRNONAA 56 (L) 06/08/2020   GFRAA 84 07/29/2019   GLUCOSE 158 (H) 06/08/2020      The ASCVD Risk score (Goff DC Jr., et al., 2013) failed to calculate for the following reasons:   The 2013 ASCVD risk score is only valid for ages 47 to 60   --------------------------------------------------------------------------------------------------- Lipid/Cholesterol, Follow-up  Last lipid panel Other pertinent labs  Lab Results  Component Value Date   CHOL 294 (H) 04/13/2020   HDL 50 04/13/2020   LDLCALC 196 (H) 04/13/2020   TRIG 242 (H) 04/13/2020   CHOLHDL 5.9 04/13/2020   Lab Results  Component Value Date   ALT 17 06/06/2020   AST 19 06/06/2020   PLT 263 06/07/2020   TSH 2.340 07/29/2019     She was last seen for this 3 months ago.  Management since that visit includes no medication changes.  She reports good compliance with treatment. She is not having side effects.   Symptoms: No chest pain No chest pressure/discomfort  No dyspnea Yes lower extremity edema  No numbness or tingling of extremity No orthopnea  No palpitations No paroxysmal nocturnal dyspnea  No speech difficulty No syncope   Current diet: well balanced, low salt Current exercise: no regular exercise  The ASCVD Risk score (Dawes., et al., 2013) failed to calculate for the following reasons:   The 2013 ASCVD risk score is only valid for ages 41 to 77  --------------------------------------------------------------------------------------------------- COPD, Follow up  She was last seen for this 3  months ago. Changes made include none. Refilled Spiriva.    She reports good compliance with treatment. She is not having side effects.  she uses rescue inhaler 1 per days. She IS experiencing none. She is NOT experiencing dyspnea, cough, wheezing, fatigue, chills, fever, increased sputum or colored sputum. she reports breathing is Improved.  Pulmonary Functions Testing Results:  No results found for: FEV1, FVC, FEV1FVC, TLC       Medications: Outpatient Medications Prior to  Visit  Medication Sig  . acetaminophen (TYLENOL) 500 MG tablet Take 1,000 mg by mouth every 6 (six) hours as needed. Pain   . albuterol (PROVENTIL) (2.5 MG/3ML) 0.083% nebulizer solution Take 3 mLs (2.5 mg total) by nebulization every 6 (six) hours as needed for wheezing or shortness of breath.  Marland Kitchen amiodarone (PACERONE) 200 MG tablet Take 1 tablet (200 mg) by mouth one daily as directed  . aspirin EC 81 MG tablet Take 1 tablet (81 mg total) by mouth daily. Swallow whole.  Marland Kitchen atorvastatin (LIPITOR) 40 MG tablet Take 1 tablet (40 mg total) by mouth daily.  . carvedilol (COREG) 6.25 MG tablet Take 1 tablet (6.25 mg total) by mouth 2 (two) times daily with a meal.  . glucose blood (CONTOUR NEXT TEST) test strip Check sugar once daily  DX E11.9  . hydrOXYzine (ATARAX/VISTARIL) 10 MG tablet TAKE 1 TO 2 TABLETS(10 TO 20 MG) BY MOUTH EVERY 6 HOURS AS NEEDED (Patient taking differently: Take 10 mg by mouth every 6 (six) hours as needed for itching or anxiety.)  . levothyroxine (SYNTHROID) 100 MCG tablet TAKE 1 TABLET(100 MCG) BY MOUTH DAILY BEFORE BREAKFAST (Patient taking differently: Take 100 mcg by mouth daily before breakfast. TAKE 1 TABLET(100 MCG) BY MOUTH DAILY BEFORE BREAKFAST)  . losartan (COZAAR) 25 MG tablet Take 1 tablet (25 mg total) by mouth every evening.  . metFORMIN (GLUCOPHAGE) 1000 MG tablet TAKE 1 TABLET(1000 MG) BY MOUTH DAILY WITH SUPPER (Patient taking differently: Take 1,000 mg by mouth daily with supper. TAKE 1 TABLET(1000 MG) BY MOUTH DAILY WITH SUPPER)  . omeprazole (PRILOSEC) 20 MG capsule TAKE 1 CAPSULE(20 MG) BY MOUTH DAILY (Patient taking differently: Take 20 mg by mouth daily.)  . tiotropium (SPIRIVA) 18 MCG inhalation capsule Place 1 capsule (18 mcg total) into inhaler and inhale daily as needed.  . torsemide (DEMADEX) 20 MG tablet Take 20 mg po daily in AM. Take one extra dose 20mg  PRN 3lb weight gain or abdominal fullness.   No facility-administered medications prior to  visit.    Review of Systems  Constitutional: Negative.   Respiratory: Negative.   Cardiovascular: Negative.   Endocrine: Negative.   Musculoskeletal: Negative.   Neurological: Negative.         Objective    BP 114/66 (BP Location: Left Arm, Patient Position: Sitting, Cuff Size: Normal)   Pulse 75   Temp 98.3 F (36.8 C) (Oral)   Wt 179 lb (81.2 kg)   SpO2 97%   BMI 30.73 kg/m  BP Readings from Last 3 Encounters:  07/04/20 114/66  06/22/20 120/70  06/15/20 (!) 111/56   Wt Readings from Last 3 Encounters:  07/04/20 179 lb (81.2 kg)  06/22/20 171 lb (77.6 kg)  06/15/20 172 lb (78 kg)       Physical Exam Vitals and nursing note reviewed.  Constitutional:      Appearance: Normal appearance. She is normal weight.  HENT:     Right Ear: Tympanic membrane normal.     Left Ear:  Tympanic membrane normal.     Nose: Nose normal.     Mouth/Throat:     Mouth: Mucous membranes are moist.     Pharynx: Oropharynx is clear.  Eyes:     General: No scleral icterus.    Conjunctiva/sclera: Conjunctivae normal.  Cardiovascular:     Rate and Rhythm: Normal rate and regular rhythm.     Pulses: Normal pulses.     Heart sounds: Normal heart sounds.  Pulmonary:     Effort: Pulmonary effort is normal.     Breath sounds: Normal breath sounds.  Abdominal:     General: Bowel sounds are normal.     Palpations: Abdomen is soft.  Musculoskeletal:     Cervical back: Normal range of motion and neck supple.     Comments: Trace. pedal edema in ankles  Skin:    General: Skin is warm and dry.  Neurological:     Mental Status: She is alert.  Psychiatric:        Mood and Affect: Mood normal.        Behavior: Behavior normal.        Thought Content: Thought content normal.        Judgment: Judgment normal.       No results found for any visits on 07/04/20.  Assessment & Plan     1. Type 2 diabetes mellitus without complication, without long-term current use of insulin (HCC) 1C is  gone from 6.9-7.4.  Work on diet and exercise.  Try to avoid hypoglycemia with meds. - POCT glycosylated hemoglobin (Hb A1C)  2. Acute on chronic combined systolic and diastolic CHF (congestive heart failure) (HCC) Daily.  Patient followed up with heart failure clinic.  3. Aortic atherosclerosis (HCC) All risk factors treated on atorvastatin  4. Essential hypertension Controlled.  5. COPD exacerbation ([place]) Patient has stop smoking.  6. Spinal stenosis of lumbar region without neurogenic claudication Follow clinically for this.   No follow-ups on file.      I, [name], MD, have reviewed all documentation for this visit. The documentation on 07/11/20 for the exam, diagnosis, procedures, and orders are all accurate and complete.    Terricka Onofrio Cranford Mon, MD  [facility] [phone] (phone) [phone] (fax)  [place]

## 2020-07-04 ENCOUNTER — Ambulatory Visit (INDEPENDENT_AMBULATORY_CARE_PROVIDER_SITE_OTHER): Payer: Medicare Other | Admitting: Family Medicine

## 2020-07-04 ENCOUNTER — Encounter: Payer: Self-pay | Admitting: Family Medicine

## 2020-07-04 ENCOUNTER — Telehealth (HOSPITAL_COMMUNITY): Payer: Self-pay

## 2020-07-04 ENCOUNTER — Other Ambulatory Visit: Payer: Self-pay | Admitting: Family

## 2020-07-04 ENCOUNTER — Other Ambulatory Visit: Payer: Self-pay

## 2020-07-04 VITALS — BP 114/66 | HR 75 | Temp 98.3°F | Wt 179.0 lb

## 2020-07-04 DIAGNOSIS — I10 Essential (primary) hypertension: Secondary | ICD-10-CM

## 2020-07-04 DIAGNOSIS — I700 Atherosclerosis of aorta: Secondary | ICD-10-CM

## 2020-07-04 DIAGNOSIS — I5043 Acute on chronic combined systolic (congestive) and diastolic (congestive) heart failure: Secondary | ICD-10-CM

## 2020-07-04 DIAGNOSIS — E119 Type 2 diabetes mellitus without complications: Secondary | ICD-10-CM

## 2020-07-04 DIAGNOSIS — J441 Chronic obstructive pulmonary disease with (acute) exacerbation: Secondary | ICD-10-CM | POA: Diagnosis not present

## 2020-07-04 DIAGNOSIS — M48061 Spinal stenosis, lumbar region without neurogenic claudication: Secondary | ICD-10-CM

## 2020-07-04 LAB — POCT GLYCOSYLATED HEMOGLOBIN (HGB A1C)
Est. average glucose Bld gHb Est-mCnc: 166
Hemoglobin A1C: 7.4 % — AB (ref 4.0–5.6)

## 2020-07-04 MED ORDER — LOSARTAN POTASSIUM 25 MG PO TABS: 25.0000 mg | ORAL_TABLET | Freq: Every evening | ORAL | 3 refills | Status: DC

## 2020-07-04 NOTE — Progress Notes (Signed)
Losartan refilled 

## 2020-07-04 NOTE — Telephone Encounter (Signed)
Kathryn [name] contacted me today and advising she has no refills for her losartan.  She was prescribed a hospitalist at her last hospital stay.  Contacted [name] with HF clinic and she will send her a new prescription in.  Left message with [name] that it should be ready later today.    [name] [phone]

## 2020-07-13 ENCOUNTER — Other Ambulatory Visit (HOSPITAL_COMMUNITY): Payer: Self-pay

## 2020-07-13 ENCOUNTER — Encounter (HOSPITAL_COMMUNITY): Payer: Self-pay

## 2020-07-13 NOTE — Progress Notes (Signed)
Today had a home visit with Kathryn [name]  She states feeling good past few days.  She states did have some shortness of breath last weekend when awoke and remembered what I told her was to try a breathing treatment.  She states her weight had went up a couple of lbs twice last week and she took extra torsemide those days.  She states it was warm last week and she sat outside a lot with her feet down.  She does not wear socks because she likes the pretty slip on shoes with her capris.  Advised she may notice more swelling if sitting outside in heat with feet down for long period of times.  She states breathing treatment helped her breathing and she did not have to call 911.  She also left message at HF clinic she advised.  Lungs are clear.  She has some swelling in ankle area today.  She is aware of appt with HF clinic coming up.  She denies any pain in chest, headaches or dizziness.  She watches high sodium foods and how much fluids she in drinking.  She is aware to call with any problems.  Will continue to visit for heart failure.   [place] [phone]

## 2020-07-17 NOTE — Progress Notes (Signed)
Cardiology Office Note  Date:  07/18/2020   ID:  [name], DOB [birth year], MRN [id number]  PCP:  Jerrol Banana., MD   Chief Complaint  Patient presents with  . 3 month follow up     "doing well." Medications reviewed by the patient verbally.     HPI:  Kathryn [name] is a 87 yo woman with  long smoking history for 50 years he stopped 6-7 years ago,  COPD back surgery x2,  chronic back pain with pain radiating down her left leg, possible hip arthritis, chronic shortness of breath  Pulmonary HTN/flash pulm HTN EF 40 to 45% January 2022  hyperlipidemia  who presents for f/u of her shortness of breath, possible PAF, hyperlipidemia, diastolic CHF  LOV 3/50/0938  In hospital x2 in 05/2020 for COPD/CHF  hospitalized from 3/1 through 3/4 for COPD and CHF exacerbations. Again 06/08/2020 Hospital records reviewed in detail  Long discussion concerning her habits at home which included high salt and fluid intake Since then she reports that she has Cut back on salt and fluids Family who presents with her today to corroborate that she has high fluid intake   Trace ankle swelling today Recent weight at home 171 Mild abdominal fullness  " I have done well, have not been in the hospital 3 weeks"  EKG personally reviewed by myself on todays visit Normal sinus rhythm with rate 82 bpm left bundle branch block  Other past medical history reviewed home  in the hospital 03/27/2020  Presented with leg edema and breathing difficulty. placed on BiPAP and IV Lasix started,  DuoNeb, steroids, and Nitropaste.  Echocardiogram : mildly reduced LVSF (EF 40 to 45%),  left bundle branch block of unknown duration.   Some question of whether there was atrial fibrillation seen on her monitor  seen in the heart failure clinic 04/04/2020.   moderate shortness of breath with exertion.    Zio monitor   PCP 04/05/2020.    improvement in her breathing per documentation.  04/12/2020,   unable to  sleep over the weekend / overnight.   feeling poorly and that symptoms similar to that before her admission   racing heart rate on Sunday night, 04/10/20.  atrial fib  Lasix up to 40 for 3 days then back to 20 daily Started on crestor 5 daily  Stress test ordered that showed no significant ischemia Ejection fraction 30 to 44% CT attenuation correction images with aortic calcification, coronary calcification proximal LAD region (Echocardiogram March 28, 2020 estimated ejection fraction 40 to 45%)   PMH:   has a past medical history of Actinic keratosis, Aortic atherosclerosis ([place]) (05/25/2020), Arthritis, Cardiomyopathy - presumed to be nonischemic, CHF (congestive heart failure) ([place]), COPD (chronic obstructive pulmonary disease) ([place]), Coronary artery calcification seen on CT scan, Diabetes mellitus without complication ([place]), Gastric ulcer (4/12), Hyperlipidemia, Hypertension, Hypothyroidism, Left bundle branch block (LBBB), Neuromuscular disorder ([place]), Peripheral vascular disease ([place]), Pneumonia, PSVT (paroxysmal supraventricular tachycardia) ([place]), Seasonal allergies, Shortness of breath, Skin cancer, Squamous cell carcinoma of skin (03/23/2013), Squamous cell carcinoma of skin (02/04/2014), and Squamous cell carcinoma of skin (09/24/2018).  PSH:    Past Surgical History:  Procedure Laterality Date  . BACK SURGERY     4/12  Dr Shellia Carwin- for lumbar stenosis  . BREAST CYST ASPIRATION Left 1965   negative  . BREAST SURGERY  1962   lumpectomy with biopsy   left  . CARPAL TUNNEL RELEASE     right  . COLONOSCOPY    .  COLONOSCOPY WITH PROPOFOL N/A 06/30/2015   Procedure: COLONOSCOPY WITH PROPOFOL;  Surgeon: Hulen Luster, MD;  Location[facility] ENDOSCOPY;  Service: Gastroenterology;  Laterality: N/A;  . COLONOSCOPY WITH PROPOFOL N/A 01/22/2017   Procedure: COLONOSCOPY WITH PROPOFOL;  Surgeon: Lollie Sails, MD;  Location: [facility] ENDOSCOPY;  Service: Endoscopy;  Laterality: N/A;  . COLONOSCOPY  WITH PROPOFOL N/A 01/28/2019   Procedure: COLONOSCOPY WITH PROPOFOL;  Surgeon: Toledo, Benay Pike, MD;  Location: [facility] ENDOSCOPY;  Service: Gastroenterology;  Laterality: N/A;  . ESOPHAGOGASTRODUODENOSCOPY    . ESOPHAGOGASTRODUODENOSCOPY (EGD) WITH PROPOFOL N/A 01/28/2019   Procedure: ESOPHAGOGASTRODUODENOSCOPY (EGD) WITH PROPOFOL;  Surgeon: Toledo, Benay Pike, MD;  Location: [facility] ENDOSCOPY;  Service: Gastroenterology;  Laterality: N/A;  . EYE SURGERY     cataract extraction with IOL bilaterally  . JOINT REPLACEMENT    . LUMBAR LAMINECTOMY/DECOMPRESSION MICRODISCECTOMY  05/24/2011   Procedure: LUMBAR LAMINECTOMY/DECOMPRESSION MICRODISCECTOMY 2 LEVELS;  Surgeon: Magnus Sinning, MD;  Location: [facility] ORS;  Service: Orthopedics;  Laterality: N/A;  L2-L3, L3-L4 (x-ray)  . RIGHT OOPHORECTOMY     due to STAPH INFECTION    Current Outpatient Medications  Medication Sig Dispense Refill  . acetaminophen (TYLENOL) 500 MG tablet Take 1,000 mg by mouth every 6 (six) hours as needed. Pain     . albuterol (PROVENTIL) (2.5 MG/3ML) 0.083% nebulizer solution Take 3 mLs (2.5 mg total) by nebulization every 6 (six) hours as needed for wheezing or shortness of breath. 150 mL 12  . amiodarone (PACERONE) 200 MG tablet Take 1 tablet (200 mg) by mouth one daily as directed 90 tablet 1  . aspirin EC 81 MG tablet Take 1 tablet (81 mg total) by mouth daily. Swallow whole. 90 tablet 3  . atorvastatin (LIPITOR) 40 MG tablet Take 1 tablet (40 mg total) by mouth daily. 90 tablet 3  . carvedilol (COREG) 6.25 MG tablet Take 1 tablet (6.25 mg total) by mouth 2 (two) times daily with a meal. 180 tablet 3  . glucose blood (CONTOUR NEXT TEST) test strip Check sugar once daily  DX E11.9 100 each 3  . hydrOXYzine (ATARAX/VISTARIL) 10 MG tablet TAKE 1 TO 2 TABLETS(10 TO 20 MG) BY MOUTH EVERY 6 HOURS AS NEEDED 100 tablet 1  . levothyroxine (SYNTHROID) 100 MCG tablet TAKE 1 TABLET(100 MCG) BY MOUTH DAILY BEFORE BREAKFAST 90 tablet 3  .  losartan (COZAAR) 25 MG tablet Take 1 tablet (25 mg total) by mouth every evening. 90 tablet 3  . metFORMIN (GLUCOPHAGE) 1000 MG tablet TAKE 1 TABLET(1000 MG) BY MOUTH DAILY WITH SUPPER 30 tablet 3  . omeprazole (PRILOSEC) 20 MG capsule TAKE 1 CAPSULE(20 MG) BY MOUTH DAILY 90 capsule 0  . tiotropium (SPIRIVA) 18 MCG inhalation capsule Place 1 capsule (18 mcg total) into inhaler and inhale daily as needed. 30 capsule 12  . torsemide (DEMADEX) 20 MG tablet Take 20 mg po daily in AM. Take one extra dose 20mg  PRN 3lb weight gain or abdominal fullness. 120 tablet 1   No current facility-administered medications for this visit.     Allergies:   Oxycodone and Prednisone   Social History:  The patient  reports that she quit smoking about 16 years ago. Her smoking use included cigarettes. She has a 25.00 pack-year smoking history. She has never used smokeless tobacco. She reports current alcohol use. She reports that she does not use drugs.   Family History:   family history includes Arthritis in her son; Breast cancer in her maternal aunt; Dementia in  her mother; Heart disease in her father; Hypertension in her sister; Lung cancer in her father.    Review of Systems: Review of Systems  Constitutional: Negative.   HENT: Negative.   Respiratory: Negative.   Cardiovascular: Negative.   Gastrointestinal: Negative.   Musculoskeletal: Negative.   Neurological: Negative.   Psychiatric/Behavioral: Negative.   All other systems reviewed and are negative.   PHYSICAL EXAM: VS:  BP 122/60 (BP Location: Left Arm, Patient Position: Sitting, Cuff Size: Normal)   Pulse 82   Ht 5\' 4"  (1.626 m)   Wt 176 lb (79.8 kg)   SpO2 96%   BMI 30.21 kg/m  , BMI Body mass index is 30.21 kg/m. GEN: Well nourished, well developed, in no acute distress HEENT: normal Neck: no JVD, carotid bruits, or masses Cardiac: RRR; no murmurs, rubs, or gallops,no edema  Respiratory:  clear to auscultation bilaterally, normal  work of breathing GI: soft, nontender, nondistended, + BS MS: no deformity or atrophy Skin: warm and dry, no rash Neuro:  Strength and sensation are intact Psych: euthymic mood, full affect  Recent Labs: 07/29/2019: TSH 2.340 06/06/2020: ALT 17; B Natriuretic Peptide 361.8 06/07/2020: Hemoglobin 11.4; Magnesium 1.9; Platelets 263 06/08/2020: BUN 24; Creatinine, Ser 1.00; Potassium 4.8; Sodium 139    Lipid Panel Lab Results  Component Value Date   CHOL 294 (H) 04/13/2020   HDL 50 04/13/2020   LDLCALC 196 (H) 04/13/2020   TRIG 242 (H) 04/13/2020     Wt Readings from Last 3 Encounters:  07/18/20 176 lb (79.8 kg)  07/13/20 178 lb (80.7 kg)  07/04/20 179 lb (81.2 kg)     ASSESSMENT AND PLAN:  Problem List Items Addressed This Visit      Cardiology Problems   Essential hypertension   Relevant Medications   torsemide (DEMADEX) 20 MG tablet   Other Relevant Orders   EKG 12-Lead   PSVT (paroxysmal supraventricular tachycardia) (HCC)   Relevant Medications   torsemide (DEMADEX) 20 MG tablet   Hypercholesteremia   Relevant Medications   torsemide (DEMADEX) 20 MG tablet     Other   Diabetes mellitus type 2, uncomplicated ([place])    Other Visit Diagnoses    Chronic systolic congestive heart failure (HCC)    -  Primary   Relevant Medications   torsemide (DEMADEX) 20 MG tablet   Other Relevant Orders   EKG 12-Lead   Complete left bundle branch block (LBBB)       Relevant Medications   torsemide (DEMADEX) 20 MG tablet   Atrial tachycardia, paroxysmal (HCC)       Relevant Medications   torsemide (DEMADEX) 20 MG tablet     COPD/emphysema Several hospital admissions past several months Stressed importance of cutting back on her fluids.  This has been a chronic issue Started to do better with less salt Weight continues to run high Compliant with her torsemide daily, suspect she will need extra torsemide twice a week, this was discussed with her Stressed importance of heating  the signs of fluid overload including ankle swelling as she has today, mild abdominal distention, PND/orthopnea Long time spent educating with family today  Cardiomyopathy No significant ischemia on prior stress testing No changes to medications Stressed importance of lifestyle modification  Atrial tachycardia Continue beta-blocker at current dose  Hyperlipidemia Continue Lipitor  Coronary disease with stable angina Denies anginal symptoms   Total encounter time more than 35 minutes  Greater than 50% was spent in counseling and coordination of care with the  patient   Signed, Esmond Plants, M.D., Ph.D. [place], [place]

## 2020-07-18 ENCOUNTER — Other Ambulatory Visit: Payer: Self-pay

## 2020-07-18 ENCOUNTER — Ambulatory Visit (INDEPENDENT_AMBULATORY_CARE_PROVIDER_SITE_OTHER): Payer: Medicare Other | Admitting: Cardiovascular Disease

## 2020-07-18 ENCOUNTER — Encounter: Payer: Self-pay | Admitting: Cardiovascular Disease

## 2020-07-18 VITALS — BP 122/60 | HR 82 | Ht 64.0 in | Wt 176.0 lb

## 2020-07-18 DIAGNOSIS — E119 Type 2 diabetes mellitus without complications: Secondary | ICD-10-CM | POA: Diagnosis not present

## 2020-07-18 DIAGNOSIS — I10 Essential (primary) hypertension: Secondary | ICD-10-CM | POA: Diagnosis not present

## 2020-07-18 DIAGNOSIS — E7800 Pure hypercholesterolemia, unspecified: Secondary | ICD-10-CM

## 2020-07-18 DIAGNOSIS — I5022 Chronic systolic (congestive) heart failure: Secondary | ICD-10-CM

## 2020-07-18 DIAGNOSIS — I447 Left bundle-branch block, unspecified: Secondary | ICD-10-CM | POA: Diagnosis not present

## 2020-07-18 DIAGNOSIS — I471 Supraventricular tachycardia: Secondary | ICD-10-CM

## 2020-07-18 MED ORDER — TORSEMIDE 20 MG PO TABS: ORAL_TABLET | ORAL | 1 refills | Status: DC

## 2020-07-18 NOTE — Patient Instructions (Addendum)
Medication Instructions:  Please take extra dose of torsemide 20 mg for weight equal to or grater  then 174  If you need a refill on your cardiac medications before your next appointment, please call your pharmacy.   Lab work: No new labs needed  Testing/Procedures: No new testing needed  Follow-Up:   . You will need a follow up appointment in 3 months  . Providers on your designated Care Team:   . Murray Hodgkins, NP . Christell Faith, PA-C . Marrianne Mood, PA-C   COVID-19 Vaccine Information can be found at: [email or web address].uk For questions related to vaccine distribution or appointments, please email [email or web address] or call [phone].

## 2020-07-22 DIAGNOSIS — M19011 Primary osteoarthritis, right shoulder: Secondary | ICD-10-CM | POA: Diagnosis not present

## 2020-07-22 DIAGNOSIS — M19012 Primary osteoarthritis, left shoulder: Secondary | ICD-10-CM | POA: Diagnosis not present

## 2020-07-22 DIAGNOSIS — M7582 Other shoulder lesions, left shoulder: Secondary | ICD-10-CM | POA: Diagnosis not present

## 2020-08-10 ENCOUNTER — Ambulatory Visit: Payer: Medicare Other | Admitting: Cardiology

## 2020-08-12 ENCOUNTER — Telehealth (HOSPITAL_COMMUNITY): Payer: Self-pay

## 2020-08-14 ENCOUNTER — Other Ambulatory Visit: Payer: Self-pay | Admitting: Family Medicine

## 2020-08-14 NOTE — Telephone Encounter (Signed)
Requested Prescriptions  Pending Prescriptions Disp Refills  . omeprazole (PRILOSEC) 20 MG capsule [Pharmacy Med Name: OMEPRAZOLE 20MG  CAPSULES] 90 capsule     Sig: TAKE 1 CAPSULE(20 MG) BY MOUTH DAILY     Gastroenterology: Proton Pump Inhibitors Passed - 08/14/2020  9:30 AM      Passed - Valid encounter within last 12 months    Recent Outpatient Visits          1 month ago Type 2 diabetes mellitus without complication, without long-term current use of insulin [facility])   [facility] Jerrol Banana., MD   4 months ago Acute systolic congestive heart failure [facility])   [facility] Jerrol Banana., MD   6 months ago [place] Jerrol Banana., MD   7 months ago Essential hypertension   [facility] Jerrol Banana., MD   7 months ago [place] Jerrol Banana., MD      Future Appointments            In 1 week Vickie Epley, MD [facility], [place]   In 1 month Jerrol Banana., MD [facility], [place]   In 2 months Gollan, Kathlene November, MD [facility], [facility]           . levothyroxine (SYNTHROID) 100 MCG tablet [Pharmacy Med Name: LEVOTHYROXINE 0.100MG  (100MCG) TAB] 90 tablet     Sig: TAKE 1 TABLET(100 MCG) BY MOUTH DAILY BEFORE BREAKFAST     Endocrinology:  Hypothyroid Agents Failed - 08/14/2020  9:30 AM      Failed - TSH needs to be rechecked within 3 months after an abnormal result. Refill until TSH is due.      Failed - TSH in normal range and within 360 days    TSH  Date Value Ref Range Status  07/29/2019 2.340 0.450 - 4.500 uIU/mL Final         Passed - Valid encounter within last 12 months    Recent Outpatient Visits          1 month ago Type 2 diabetes mellitus without complication, without long-term current use of insulin [facility])   [facility] Jerrol Banana., MD    4 months ago Acute systolic congestive heart failure [facility])   [facility] Jerrol Banana., MD   6 months ago [place] Jerrol Banana., MD   7 months ago Essential hypertension   [facility] Jerrol Banana., MD   7 months ago [place] Jerrol Banana., MD      Future Appointments            In 1 week Vickie Epley, MD [facility], [place]   In 1 month Jerrol Banana., MD [facility], [place]   In 2 months Gollan, Kathlene November, MD [facility], [place]

## 2020-08-14 NOTE — Telephone Encounter (Signed)
Requested medication (s) are due for refill today: yes  Requested medication (s) are on the active medication list: yes  Last refill:  08/17/19  Future visit scheduled: yes  Notes to clinic:  overdue labs   Requested Prescriptions  Pending Prescriptions Disp Refills   levothyroxine (SYNTHROID) 100 MCG tablet [Pharmacy Med Name: LEVOTHYROXINE 0.100MG  (100MCG) TAB] 90 tablet     Sig: TAKE 1 TABLET(100 MCG) BY MOUTH DAILY BEFORE BREAKFAST      Endocrinology:  Hypothyroid Agents Failed - 08/14/2020  9:30 AM      Failed - TSH needs to be rechecked within 3 months after an abnormal result. Refill until TSH is due.      Failed - TSH in normal range and within 360 days    TSH  Date Value Ref Range Status  07/29/2019 2.340 0.450 - 4.500 uIU/mL Final          Passed - Valid encounter within last 12 months    Recent Outpatient Visits           1 month ago Type 2 diabetes mellitus without complication, without long-term current use of insulin [facility])   [facility] Jerrol Banana., MD   4 months ago Acute systolic congestive heart failure [facility])   [facility] Jerrol Banana., MD   6 months ago [place] Jerrol Banana., MD   7 months ago Essential hypertension   [facility] Jerrol Banana., MD   7 months ago [place] Jerrol Banana., MD       Future Appointments             In 1 week Vickie Epley, MD [facility], [place]   In 1 month Jerrol Banana., MD [facility], [facility]   In 2 months Gollan, Kathlene November, MD [place], [facility]              Refused Prescriptions Disp Refills   omeprazole (PRILOSEC) 20 MG capsule [Pharmacy Med Name: OMEPRAZOLE 20MG  CAPSULES] 90 capsule     Sig: TAKE 1 CAPSULE(20 MG) BY MOUTH DAILY      Gastroenterology: Proton Pump Inhibitors Passed - 08/14/2020   9:30 AM      Passed - Valid encounter within last 12 months    Recent Outpatient Visits           1 month ago Type 2 diabetes mellitus without complication, without long-term current use of insulin [facility])   [facility] Jerrol Banana., MD   4 months ago Acute systolic congestive heart failure [facility])   [facility] Jerrol Banana., MD   6 months ago [place] Jerrol Banana., MD   7 months ago Essential hypertension   [facility] Jerrol Banana., MD   7 months ago [place] Jerrol Banana., MD       Future Appointments             In 1 week Vickie Epley, MD [facility], [place]   In 1 month Jerrol Banana., MD [facility], [place]   In 2 months Gollan, Kathlene November, MD [facility], [place]

## 2020-08-15 ENCOUNTER — Telehealth (HOSPITAL_COMMUNITY): Payer: Self-pay

## 2020-08-15 NOTE — Telephone Encounter (Signed)
Kathryn [name] contacted me today advising she needed help.  She states her legs are so sore she can not hardly walk.  She states she does not have edema in them.  She states her weight is up about 4 lbs this past week.  She states she is not hungry and has not eaten as much. She denies chest pain, headaches.  She states breathing is little worse.  She has took an extra fluid pill once this week with no help.  Contacted [name] with HF clinic for consult and she advise her to take extra fluid pill through the weekend for 3 days and she made her appt for her on Tuesday for blood work.  Will check on her Monday and she is aware any problems to go to ED.    [place] [phone]

## 2020-08-16 ENCOUNTER — Encounter: Payer: Self-pay | Admitting: Pharmacist

## 2020-08-16 ENCOUNTER — Ambulatory Visit: Payer: Medicare Other | Attending: Family | Admitting: Family

## 2020-08-16 ENCOUNTER — Other Ambulatory Visit: Payer: Self-pay

## 2020-08-16 ENCOUNTER — Other Ambulatory Visit
Admission: RE | Admit: 2020-08-16 | Discharge: 2020-08-16 | Disposition: A | Discharge status: Home / Self Care | Payer: Medicare Other | Source: Ambulatory Visit | Attending: Family | Admitting: Family

## 2020-08-16 ENCOUNTER — Encounter: Payer: Self-pay | Admitting: Family

## 2020-08-16 VITALS — BP 108/61 | HR 79 | Resp 18 | Ht 64.0 in | Wt 174.0 lb

## 2020-08-16 DIAGNOSIS — Z8249 Family history of ischemic heart disease and other diseases of the circulatory system: Secondary | ICD-10-CM | POA: Insufficient documentation

## 2020-08-16 DIAGNOSIS — I5022 Chronic systolic (congestive) heart failure: Secondary | ICD-10-CM | POA: Insufficient documentation

## 2020-08-16 DIAGNOSIS — I10 Essential (primary) hypertension: Secondary | ICD-10-CM

## 2020-08-16 DIAGNOSIS — Z90721 Acquired absence of ovaries, unilateral: Secondary | ICD-10-CM | POA: Insufficient documentation

## 2020-08-16 DIAGNOSIS — I471 Supraventricular tachycardia: Secondary | ICD-10-CM | POA: Diagnosis not present

## 2020-08-16 DIAGNOSIS — E1151 Type 2 diabetes mellitus with diabetic peripheral angiopathy without gangrene: Secondary | ICD-10-CM | POA: Diagnosis not present

## 2020-08-16 DIAGNOSIS — R609 Edema, unspecified: Secondary | ICD-10-CM | POA: Diagnosis not present

## 2020-08-16 DIAGNOSIS — Z888 Allergy status to other drugs, medicaments and biological substances status: Secondary | ICD-10-CM | POA: Diagnosis not present

## 2020-08-16 DIAGNOSIS — E119 Type 2 diabetes mellitus without complications: Secondary | ICD-10-CM

## 2020-08-16 DIAGNOSIS — G8929 Other chronic pain: Secondary | ICD-10-CM | POA: Diagnosis not present

## 2020-08-16 DIAGNOSIS — Z87891 Personal history of nicotine dependence: Secondary | ICD-10-CM | POA: Diagnosis not present

## 2020-08-16 DIAGNOSIS — Z7984 Long term (current) use of oral hypoglycemic drugs: Secondary | ICD-10-CM | POA: Diagnosis not present

## 2020-08-16 DIAGNOSIS — R5383 Other fatigue: Secondary | ICD-10-CM | POA: Insufficient documentation

## 2020-08-16 DIAGNOSIS — F419 Anxiety disorder, unspecified: Secondary | ICD-10-CM | POA: Insufficient documentation

## 2020-08-16 DIAGNOSIS — Z885 Allergy status to narcotic agent status: Secondary | ICD-10-CM | POA: Diagnosis not present

## 2020-08-16 DIAGNOSIS — I110 Hypertensive heart disease with heart failure: Secondary | ICD-10-CM | POA: Insufficient documentation

## 2020-08-16 DIAGNOSIS — G479 Sleep disorder, unspecified: Secondary | ICD-10-CM | POA: Diagnosis not present

## 2020-08-16 LAB — BASIC METABOLIC PANEL
Anion gap: 13 (ref 5–15)
BUN: 22 mg/dL (ref 8–23)
CO2: 26 mmol/L (ref 22–32)
Calcium: 9.6 mg/dL (ref 8.9–10.3)
Chloride: 101 mmol/L (ref 98–111)
Creatinine, Ser: 0.9 mg/dL (ref 0.44–1.00)
GFR, Estimated: >60 mL/min (ref >60)
Glucose, Bld: 147 mg/dL — ABNORMAL HIGH (ref 70–99)
Potassium: 4.8 mmol/L (ref 3.5–5.1)
Sodium: 140 mmol/L (ref 135–145)

## 2020-08-16 MED ORDER — DAPAGLIFLOZIN PROPANEDIOL 10 MG PO TABS: 10.0000 mg | ORAL_TABLET | Freq: Every day | ORAL | 5 refills | Status: DC

## 2020-08-16 NOTE — Progress Notes (Signed)
Patient ID: ANYELA NAPIERKOWSKI, female    DOB: 12-Sep-1933, 87 y.o.   MRN: [id number]  HPI  Ms Abee is a 87 y/o female with a history of atrial fibrillation, DM, hyperlipidemia, HTN, thyroid disease, PVD, gastric ulcer, COPD, previous tobacco use and chronic heart failure.   Echo report from 03/28/20 reviewed and showed an EF of 40-45% along with mild LAE and mild MR/ AR.   Admitted 06/06/20 due to HF/ COPD exacerbation. Initially given IV lasix with transition to oral diuretics. Discharged after 2 days. Admitted 05/24/20 due to HF/ COPD exacerbation. Cardiology consult obtained. Initially needed bipap. Diuresed and symptoms improved. Discharged after 3 days. Admitted 03/26/20 due to new onset HF. Initially needed bipap but then able to be weaned down to 3L nasal cannula. Cardiology consult obtained. Initially given IV lasix with transition to oral diuretics. Discharged after 3 days.   She presents today for a follow-up visit with a chief complaint of minimal shortness of breath upon moderate exertion. She describes this as chronic in nature having been present for several years. She has associated fatigue, pedal edema, chronic pain, anxiety and difficulty sleeping along with this. She denies any abdominal distention, palpitations, chest pain, dizziness, cough or weight gain.   She says that she took an extra diuretic last Friday and Saturday with improvement of her symptoms.   Past Medical History:  Diagnosis Date  . Actinic keratosis   . Aortic atherosclerosis ([place]) 05/25/2020  . Arthritis    spinal stenosis  . Cardiomyopathy - presumed to be nonischemic    a. 03/2020 Echo: EF 40-45%, gr2 DD. Nl RV fxn. Mildly dil LA. Mild MR/ao sclerosis; b. 03/2020 MV: EF 30-44%, no ischemia. Cor Ca2+ in pLAD.  Marland Kitchen CHF (congestive heart failure) ([place])   . COPD (chronic obstructive pulmonary disease) ([place])   . Coronary artery calcification seen on CT scan    a. 03/2020 MV: CT attenuation images show Ao and mod pLAD  Ca2+.  . Diabetes mellitus without complication ([place])   . Gastric ulcer 4/12   treated with Prolisec- states no problems now  . Hyperlipidemia   . Hypertension    PCP Dr Miguel Aschoff   [place]  . Hypothyroidism   . Left bundle branch block (LBBB)   . Neuromuscular disorder ([place])    slight numbness right toes- comes and goes  . Peripheral vascular disease (HCC)    varicose veins left leg  . Pneumonia   . PSVT (paroxysmal supraventricular tachycardia) ([place])    a. 03/2020 Zio: Avg HR 87 (66-211).  4 beats NSVT.  43 PSVT episodes, longest 10h 82m @ avg of 153 bpm (max 211).  Seen by EP-->amio started (pt wished to avoid EPS/RFCA).  . Seasonal allergies   . Shortness of breath   . Skin cancer    multiple from face  . Squamous cell carcinoma of skin 03/23/2013   L dorsal hand  . Squamous cell carcinoma of skin 02/04/2014   R forearm/in situ, L pretibial  . Squamous cell carcinoma of skin 09/24/2018   R thumb   Past Surgical History:  Procedure Laterality Date  . BACK SURGERY     4/12  Dr Shellia Carwin- for lumbar stenosis  . BREAST CYST ASPIRATION Left 1965   negative  . BREAST SURGERY  1962   lumpectomy with biopsy   left  . CARPAL TUNNEL RELEASE     right  . COLONOSCOPY    . COLONOSCOPY WITH PROPOFOL N/A 06/30/2015  Procedure: COLONOSCOPY WITH PROPOFOL;  Surgeon: Hulen Luster, MD;  [facility] ENDOSCOPY;  Service: Gastroenterology;  Laterality: N/A;  . COLONOSCOPY WITH PROPOFOL N/A 01/22/2017   Procedure: COLONOSCOPY WITH PROPOFOL;  Surgeon: Lollie Sails, MD;  Location: [facility] ENDOSCOPY;  Service: Endoscopy;  Laterality: N/A;  . COLONOSCOPY WITH PROPOFOL N/A 01/28/2019   Procedure: COLONOSCOPY WITH PROPOFOL;  Surgeon: Toledo, Benay Pike, MD;  Location: [facility] ENDOSCOPY;  Service: Gastroenterology;  Laterality: N/A;  . ESOPHAGOGASTRODUODENOSCOPY    . ESOPHAGOGASTRODUODENOSCOPY (EGD) WITH PROPOFOL N/A 01/28/2019   Procedure: ESOPHAGOGASTRODUODENOSCOPY (EGD) WITH PROPOFOL;   Surgeon: Toledo, Benay Pike, MD;  Location: [facility] ENDOSCOPY;  Service: Gastroenterology;  Laterality: N/A;  . EYE SURGERY     cataract extraction with IOL bilaterally  . JOINT REPLACEMENT    . LUMBAR LAMINECTOMY/DECOMPRESSION MICRODISCECTOMY  05/24/2011   Procedure: LUMBAR LAMINECTOMY/DECOMPRESSION MICRODISCECTOMY 2 LEVELS;  Surgeon: Magnus Sinning, MD;  Location: [facility] ORS;  Service: Orthopedics;  Laterality: N/A;  L2-L3, L3-L4 (x-ray)  . RIGHT OOPHORECTOMY     due to STAPH INFECTION   Family History  Problem Relation Age of Onset  . Dementia Mother   . Lung cancer Father   . Heart disease Father   . Hypertension Sister   . Breast cancer Maternal Aunt   . Arthritis Son    Social History   Tobacco Use  . Smoking status: Former Smoker    Packs/day: 0.50    Years: 50.00    Pack years: 25.00    Types: Cigarettes    Quit date: 05/16/2004    Years since quitting: 16.2  . Smokeless tobacco: Never Used  Substance Use Topics  . Alcohol use: Yes    Comment: socially-1 glass of wine or [place] icea tea once a month   Allergies  Allergen Reactions  . Oxycodone Other (See Comments)    Mental status change  . Prednisone     swelling, bruising.   Prior to Admission medications   Medication Sig Start Date End Date Taking? Authorizing Provider  acetaminophen (TYLENOL) 500 MG tablet Take 1,000 mg by mouth every 6 (six) hours as needed. Pain    Yes [provider]  albuterol (PROVENTIL) (2.5 MG/3ML) 0.083% nebulizer solution Take 3 mLs (2.5 mg total) by nebulization every 6 (six) hours as needed for wheezing or shortness of breath. 04/05/20  Yes Jerrol Banana., MD  amiodarone (PACERONE) 200 MG tablet Take 1 tablet (200 mg) by mouth one daily as directed 05/11/20  Yes Vickie Epley, MD  aspirin EC 81 MG tablet Take 1 tablet (81 mg total) by mouth daily. Swallow whole. 04/18/20  Yes Visser, Jacquelyn D, PA-C  atorvastatin (LIPITOR) 40 MG tablet Take 1 tablet (40 mg  total) by mouth daily. 04/18/20  Yes Minna Merritts, MD  carvedilol (COREG) 6.25 MG tablet Take 1 tablet (6.25 mg total) by mouth 2 (two) times daily with a meal. 04/27/20 04/22/21 Yes Visser, Jacquelyn D, PA-C  glucose blood (CONTOUR NEXT TEST) test strip Check sugar once daily  DX E11.9 11/16/16  Yes Jerrol Banana., MD  hydrOXYzine (ATARAX/VISTARIL) 10 MG tablet TAKE 1 TO 2 TABLETS(10 TO 20 MG) BY MOUTH EVERY 6 HOURS AS NEEDED 04/26/20  Yes Jerrol Banana., MD  levothyroxine (SYNTHROID) 100 MCG tablet TAKE 1 TABLET(100 MCG) BY MOUTH DAILY BEFORE BREAKFAST 08/17/19  Yes Jerrol Banana., MD  losartan (COZAAR) 25 MG tablet Take 1 tablet (25 mg total) by mouth every evening. 07/04/20  Yes Darylene Price A, FNP  metFORMIN (GLUCOPHAGE) 1000 MG tablet TAKE 1 TABLET(1000 MG) BY MOUTH DAILY WITH SUPPER 05/08/20  Yes Jerrol Banana., MD  omeprazole (PRILOSEC) 20 MG capsule TAKE 1 CAPSULE(20 MG) BY MOUTH DAILY 05/31/20  Yes Jerrol Banana., MD  tiotropium [facility]) 18 MCG inhalation capsule Place 1 capsule (18 mcg total) into inhaler and inhale daily as needed. 04/05/20  Yes Jerrol Banana., MD  torsemide (DEMADEX) 20 MG tablet Take 20 mg po daily in AM. Take one extra dose 20mg  PRN 3lb weight gain or abdominal fullness. 07/18/20  Yes Minna Merritts, MD   Review of Systems  Constitutional: Positive for fatigue. Negative for appetite change.  HENT: Negative for congestion, rhinorrhea and sore throat.   Eyes: Negative.   Respiratory: Positive for shortness of breath. Negative for cough and chest tightness.   Cardiovascular: Positive for leg swelling. Negative for chest pain and palpitations.  Gastrointestinal: Negative for abdominal distention and abdominal pain.  Endocrine: Negative.   Genitourinary: Negative.   Musculoskeletal: Positive for arthralgias (shoulders) and back pain.  Skin: Negative.   Allergic/Immunologic: Negative.   Neurological: Negative for dizziness  and light-headedness.  Hematological: Negative for adenopathy. Does not bruise/bleed easily.  Psychiatric/Behavioral: Positive for sleep disturbance (sleeping on 1-2 pillows). Negative for dysphoric mood. The patient is nervous/anxious.    Vitals:   08/16/20 1106  BP: 108/61  Pulse: 79  Resp: 18  SpO2: 96%  Weight: 174 lb (78.9 kg)  Height: 5\' 4"  (1.626 m)   Wt Readings from Last 3 Encounters:  08/16/20 174 lb (78.9 kg)  07/18/20 176 lb (79.8 kg)  07/13/20 178 lb (80.7 kg)   Lab Results  Component Value Date   CREATININE 1.00 06/08/2020   CREATININE 1.12 (H) 06/07/2020   CREATININE 0.91 06/06/2020    Physical Exam Vitals and nursing note reviewed. Exam conducted with a chaperone present (son).  Constitutional:      Appearance: She is well-developed.  HENT:     Head: Normocephalic and atraumatic.  Neck:     Vascular: No JVD.  Cardiovascular:     Rate and Rhythm: Normal rate and regular rhythm.  Pulmonary:     Effort: Pulmonary effort is normal. No respiratory distress.     Breath sounds: No wheezing or rales.  Abdominal:     Palpations: Abdomen is soft.     Tenderness: There is no abdominal tenderness.  Musculoskeletal:     Cervical back: Neck supple.     Right lower leg: No tenderness. Edema (trace pitting) present.     Left lower leg: No tenderness. Edema (trace pitting) present.  Skin:    General: Skin is warm and dry.  Neurological:     General: No focal deficit present.     Mental Status: She is alert and oriented to person, place, and time.  Psychiatric:        Mood and Affect: Mood normal.        Behavior: Behavior normal.    Assessment & Plan:  1: Chronic heart failure with reduced ejection fraction- - NYHA class II - euvolemic today - weighing daily and reminded to call for an overnight weight gain of > 2 pounds or a weekly weight gain of >5 pounds - weight up 2 pounds from last visit here 2 months ago - not adding much salt now; encouraged her to  continue to read food labels for sodium content and keep daily sodium content to <  2000mg  / day - saw [facility] cardiology Rockey Situ) 07/18/20 - on GDMT of carvedilol & losartan; not sure BP could tolerate spironolactone - will add farxiga 10mg  daily; 30 day voucher given to patient - check BMP next visit; will also check BMP today - wearing compression socks daily - BNP 06/06/20 was 361.8 - participating in paramedicine program - PharmD reconciled medications with the patient  2: HTN- - BP looks good today (108/61) - saw PCP Rosanna Randy) 07/04/20 - BMP 06/08/20 reviewed and showed sodium 139, potassium 4.8, creatinine 1.00 and GFR 56  3: Diabetes- - A1c 03/27/20 was 6.9%  4: PSVT- - saw EP Quentin Ore) 05/11/20 due to SVT on zio monitor - continues on amiodarone  - possible ablation in the future if needed   Patient did not bring her medications nor a list. Each medication was verbally reviewed with the patient and she was encouraged to bring the bottles to every visit to confirm accuracy of list.  Return in 3 weeks or sooner for any questions/problems before then.

## 2020-08-16 NOTE — Progress Notes (Signed)
[place] - PHARMACIST COUNSELING NOTE  Guideline-Directed Medical Therapy/Evidence Based Medicine  ACE/ARB/ARNI:  Cozaar (Losartan) 25 mg daily Beta Blocker: carvedilol 6.25 mg BID Aldosterone Antagonist: None Diuretic: toresemide 20 mg every day SGLT2i: None  Adherence Assessment  Do you ever forget to take your medication? [] Yes [x] No  Do you ever skip doses due to side effects? [] Yes [x] No  Do you have trouble affording your medicines? [] Yes [x] No  Are you ever unable to pick up your medication due to transportation difficulties? [] Yes [x] No  Do you ever stop taking your medications because you don't believe they are helping? [] Yes [x] No   Adherence strategy: Pillbox; no barriers to obtaining medications   Vital signs: HR 79, BP 108/61, weight (pounds) 174 ECHO: Date 03/28/20, EF 40-45%  BMP Latest Ref Rng & Units 06/08/2020 06/07/2020 06/06/2020  Glucose 70 - 99 mg/dL 158(H) 147(H) 228(H)  BUN 8 - 23 mg/dL 24(H) 24(H) 20  Creatinine 0.44 - 1.00 mg/dL 1.00 1.12(H) 0.91  BUN/Creat Ratio 12 - 28 - - -  Sodium 135 - 145 mmol/L 139 139 140  Potassium 3.5 - 5.1 mmol/L 4.8 4.9 4.2  Chloride 98 - 111 mmol/L 104 101 106  CO2 22 - 32 mmol/L 27 28 26   Calcium 8.9 - 10.3 mg/dL 9.5 9.7 9.3    Past Medical History:  Diagnosis Date  . Actinic keratosis   . Aortic atherosclerosis ([place]) 05/25/2020  . Arthritis    spinal stenosis  . Cardiomyopathy - presumed to be nonischemic    a. 03/2020 Echo: EF 40-45%, gr2 DD. Nl RV fxn. Mildly dil LA. Mild MR/ao sclerosis; b. 03/2020 MV: EF 30-44%, no ischemia. Cor Ca2+ in pLAD.  Marland Kitchen CHF (congestive heart failure) ([place])   . COPD (chronic obstructive pulmonary disease) ([place])   . Coronary artery calcification seen on CT scan    a. 03/2020 MV: CT attenuation images show Ao and mod pLAD Ca2+.  . Diabetes mellitus without complication ([place])   . Gastric ulcer 4/12   treated with Prolisec- states no problems  now  . Hyperlipidemia   . Hypertension    PCP Dr Miguel Aschoff   [place]  . Hypothyroidism   . Left bundle branch block (LBBB)   . Neuromuscular disorder ([place])    slight numbness right toes- comes and goes  . Peripheral vascular disease (HCC)    varicose veins left leg  . Pneumonia   . PSVT (paroxysmal supraventricular tachycardia) ([place])    a. 03/2020 Zio: Avg HR 87 (66-211).  4 beats NSVT.  43 PSVT episodes, longest 10h 78m @ avg of 153 bpm (max 211).  Seen by EP-->amio started (pt wished to avoid EPS/RFCA).  . Seasonal allergies   . Shortness of breath   . Skin cancer    multiple from face  . Squamous cell carcinoma of skin 03/23/2013   L dorsal hand  . Squamous cell carcinoma of skin 02/04/2014   R forearm/in situ, L pretibial  . Squamous cell carcinoma of skin 09/24/2018   R thumb    ASSESSMENT 87 year old female who presents to the HF clinic for a follow up visit. Pt does state she has minimal swelling at this time and admits to SOB when walking. No other complaints at this time.   Recent ED Visit (past 6 months): Date 06/06/2020, CC : HF/COPDE  PLAN CHF -continue carvedilol 6.25 mg BID, losartan 25 mg daily, and torsemide 20 mg daily -discussed with provider -  start Farxiga 10 mg daily -continue daily weight checks  DM -continue metformin 1000 mg daily and start Farxiga as noted above  Afib -continue amiodarone 200 mg daily -Due to the question of PAF and the short duration of the episodes, it was decided to defer anticoagulation until placement of 14-day monitor was completed as an outpatient. The monitor revealed NSR w/1 run of VT lasting 4 beats, 43 runs of SVT w/one episode lasting 10 hrs 35 mins, and premature contractions. Patient triggered events were not associated w/significant arrhythmia. She was seen by EP, Dr. Quentin Ore on Feb. 16, 2022 and started on amiodarone. Pt did not wish to proceed with ablation at that time  COPD -continue PRN albuterol  nebulizer solution and daily Spiriva  Hypothyroidism -continue levothyroxine 100 mcg daily   HLD -continue atorvastatin 40 mg daily  Arthritis -continue PRN Tylenol -avoid NSAIDs    Time spent: 10 minutes  Sherilyn Banker, PharmD Pharmacy Resident  08/16/2020 11:58 AM    Current Outpatient Medications:  .  acetaminophen (TYLENOL) 500 MG tablet, Take 1,000 mg by mouth every 6 (six) hours as needed. Pain , Disp: , Rfl:  .  albuterol (PROVENTIL) (2.5 MG/3ML) 0.083% nebulizer solution, Take 3 mLs (2.5 mg total) by nebulization every 6 (six) hours as needed for wheezing or shortness of breath., Disp: 150 mL, Rfl: 12 .  amiodarone (PACERONE) 200 MG tablet, Take 1 tablet (200 mg) by mouth one daily as directed, Disp: 90 tablet, Rfl: 1 .  aspirin EC 81 MG tablet, Take 1 tablet (81 mg total) by mouth daily. Swallow whole., Disp: 90 tablet, Rfl: 3 .  atorvastatin (LIPITOR) 40 MG tablet, Take 1 tablet (40 mg total) by mouth daily., Disp: 90 tablet, Rfl: 3 .  carvedilol (COREG) 6.25 MG tablet, Take 1 tablet (6.25 mg total) by mouth 2 (two) times daily with a meal., Disp: 180 tablet, Rfl: 3 .  dapagliflozin propanediol (FARXIGA) 10 MG TABS tablet, Take 1 tablet (10 mg total) by mouth daily before breakfast., Disp: 30 tablet, Rfl: 5 .  glucose blood (CONTOUR NEXT TEST) test strip, Check sugar once daily  DX E11.9, Disp: 100 each, Rfl: 3 .  hydrOXYzine (ATARAX/VISTARIL) 10 MG tablet, TAKE 1 TO 2 TABLETS(10 TO 20 MG) BY MOUTH EVERY 6 HOURS AS NEEDED, Disp: 100 tablet, Rfl: 1 .  levothyroxine (SYNTHROID) 100 MCG tablet, TAKE 1 TABLET(100 MCG) BY MOUTH DAILY BEFORE BREAKFAST, Disp: 90 tablet, Rfl: 3 .  losartan (COZAAR) 25 MG tablet, Take 1 tablet (25 mg total) by mouth every evening., Disp: 90 tablet, Rfl: 3 .  metFORMIN (GLUCOPHAGE) 1000 MG tablet, TAKE 1 TABLET(1000 MG) BY MOUTH DAILY WITH SUPPER, Disp: 30 tablet, Rfl: 3 .  omeprazole (PRILOSEC) 20 MG capsule, TAKE 1 CAPSULE(20 MG) BY MOUTH  DAILY, Disp: 90 capsule, Rfl: 0 .  tiotropium (SPIRIVA) 18 MCG inhalation capsule, Place 1 capsule (18 mcg total) into inhaler and inhale daily as needed., Disp: 30 capsule, Rfl: 12 .  torsemide (DEMADEX) 20 MG tablet, Take 20 mg po daily in AM. Take one extra dose 20mg  PRN 3lb weight gain or abdominal fullness., Disp: 120 tablet, Rfl: 1   COUNSELING POINTS/CLINICAL PEARLS  Carvedilol (Goal: weight less than 85 kg is 25 mg BID, weight greater than 85 kg is 50 mg BID)  Patient should avoid activities requiring coordination until drug effects are realized, as drug may cause dizziness.  This drug may cause diarrhea, nausea, vomiting, arthralgia, back pain, myalgia, headache, vision disorder, erectile  dysfunction, reduced libido, or fatigue.  Instruct patient to report signs/symptoms of adverse cardiovascular effects such as hypotension (especially in elderly patients), arrhythmias, syncope, palpitations, angina, or edema.  Drug may mask symptoms of hypoglycemia. Advise diabetic patients to carefully monitor blood sugar levels.  Patient should take drug with food.  Advise patient against sudden discontinuation of drug. Losartan (Goal: 150 mg once daily)  Warn female patient to avoid pregnancy and to report a pregnancy that occurs during therapy.  Side effects may include dizziness, upper respiratory infection, nasal congestion, and back pain.  Warn patient to avoid use of potassium supplements or potassium-containing salt substitutes unless they consult healthcare provider. Torsemide  Side effects may include excessive urination.  Tell patient to report symptoms of ototoxicity.  Instruct patient to report lightheadedness or syncope.  Warn patient to avoid use of nonprescription NSAID products without first discussing it with their healthcare provider.  DRUGS TO AVOID IN HEART FAILURE  Drug or Class Mechanism  Analgesics . NSAIDs . COX-2 inhibitors . Glucocorticoids  Sodium and water  retention, increased systemic vascular resistance, decreased response to diuretics   Diabetes Medications . Metformin . Thiazolidinediones o Rosiglitazone (Avandia) o Pioglitazone (Actos) . DPP4 Inhibitors o Saxagliptin (Onglyza) o Sitagliptin (Januvia)   Lactic acidosis Possible calcium channel blockade   Unknown  Antiarrhythmics . Class I  o Flecainide o Disopyramide . Class III o Sotalol . Other o Dronedarone  Negative inotrope, proarrhythmic   Proarrhythmic, beta blockade  Negative inotrope  Antihypertensives . Alpha Blockers o Doxazosin . Calcium Channel Blockers o Diltiazem o Verapamil o Nifedipine . Central Alpha Adrenergics o Moxonidine . Peripheral Vasodilators o Minoxidil  Increases renin and aldosterone  Negative inotrope    Possible sympathetic withdrawal  Unknown  Anti-infective . Itraconazole . Amphotericin B  Negative inotrope Unknown  Hematologic . Anagrelide . Cilostazol   Possible inhibition of PD IV Inhibition of PD III causing arrhythmias  Neurologic/Psychiatric . Stimulants . Anti-Seizure Drugs o Carbamazepine o Pregabalin . Antidepressants o Tricyclics o Citalopram . Parkinsons o Bromocriptine o Pergolide o Pramipexole . Antipsychotics o Clozapine . Antimigraine o Ergotamine o Methysergide . Appetite suppressants . Bipolar o Lithium  Peripheral alpha and beta agonist activity  Negative inotrope and chronotrope Calcium channel blockade  Negative inotrope, proarrhythmic Dose-dependent QT prolongation  Excessive serotonin activity/valvular damage Excessive serotonin activity/valvular damage Unknown  IgE mediated hypersensitivy, calcium channel blockade  Excessive serotonin activity/valvular damage Excessive serotonin activity/valvular damage Valvular damage  Direct myofibrillar degeneration, adrenergic stimulation  Antimalarials . Chloroquine . Hydroxychloroquine Intracellular inhibition of  lysosomal enzymes  Urologic Agents . Alpha Blockers o Doxazosin o Prazosin o Tamsulosin o Terazosin  Increased renin and aldosterone  Adapted from Page RL, et al. "Drugs That May Cause or Exacerbate Heart Failure: A Scientific Statement from the [employer]." Circulation 2016; 588:F02-D74. DOI: 10.1161/CIR.0000000000000426   MEDICATION ADHERENCES TIPS AND STRATEGIES 1. Taking medication as prescribed improves patient outcomes in heart failure (reduces hospitalizations, improves symptoms, increases survival) 2. Side effects of medications can be managed by decreasing doses, switching agents, stopping drugs, or adding additional therapy. Please let someone in the [facility] know if you have having bothersome side effects so we can modify your regimen. Do not alter your medication regimen without talking to [place].  3. Medication reminders can help patients remember to take drugs on time. If you are missing or forgetting doses you can try linking behaviors, using pill boxes, or an electronic reminder like an alarm on your phone  or an app. Some people can also get automated phone calls as medication reminders.

## 2020-08-16 NOTE — Telephone Encounter (Signed)
Contacted [place] today to see how she was feeling.   She states doing some better, she denies any chest pain, headaches or dizziness.  She still complains of pain in her legs but not as bad.  She states she has some swelling in ankle area.  She states she took the extra fluid pill this weekend.  She states she did not urinate a lot the day she took them but today she can not stay out of the bathroom.  Asked if was wearing socks and propping legs up, she states does not like wearing socks, explained reason behind it.  She states will try to find some.  She states she is staying inside now due to rain and will prop her legs up.  She is not one to sit around, she likes sitting outside.  Explained the reason to propping her legs up especially when she may have extra fluid gathering around her ankles.  She appeared to understand but not sure if she will.  She states her diet has been low sodium and she does not appear to be drinking too much fluid.  She is aware of appt with [name] at HF clinic tomorrow and understand why.  Will continue to visit for heart failure, diet and medication compliance.   [place] [phone]

## 2020-08-16 NOTE — Patient Instructions (Addendum)
Continue weighing daily and call for an overnight weight gain of > 2 pounds or a weekly weight gain of >5 pounds.    Begin Farxiga as 1 tablet daily.

## 2020-08-23 ENCOUNTER — Other Ambulatory Visit: Payer: Self-pay | Admitting: Family Medicine

## 2020-08-23 NOTE — Telephone Encounter (Signed)
Requested Prescriptions  Pending Prescriptions Disp Refills  . omeprazole (PRILOSEC) 20 MG capsule [Pharmacy Med Name: OMEPRAZOLE 20MG  CAPSULES] 90 capsule 0    Sig: TAKE 1 CAPSULE(20 MG) BY MOUTH DAILY     Gastroenterology: Proton Pump Inhibitors Passed - 08/23/2020  9:42 AM      Passed - Valid encounter within last 12 months    Recent Outpatient Visits          1 month ago Type 2 diabetes mellitus without complication, without long-term current use of insulin [facility])   [facility] Jerrol Banana., MD   4 months ago Acute systolic congestive heart failure [facility])   [facility] Jerrol Banana., MD   7 months ago [place] Jerrol Banana., MD   7 months ago Essential hypertension   [facility] Jerrol Banana., MD   7 months ago [place] Jerrol Banana., MD      Future Appointments            Tomorrow Vickie Epley, MD [facility], [place]   In 1 month Jerrol Banana., MD [facility], [place]   In 1 month Gollan, Kathlene November, MD [facility], [place]

## 2020-08-24 ENCOUNTER — Other Ambulatory Visit: Payer: Self-pay

## 2020-08-24 ENCOUNTER — Encounter: Payer: Self-pay | Admitting: Cardiology

## 2020-08-24 ENCOUNTER — Ambulatory Visit (INDEPENDENT_AMBULATORY_CARE_PROVIDER_SITE_OTHER): Payer: Medicare Other | Admitting: Cardiology

## 2020-08-24 VITALS — BP 120/62 | HR 74 | Ht 64.0 in | Wt 176.0 lb

## 2020-08-24 DIAGNOSIS — I5022 Chronic systolic (congestive) heart failure: Secondary | ICD-10-CM

## 2020-08-24 DIAGNOSIS — I10 Essential (primary) hypertension: Secondary | ICD-10-CM | POA: Diagnosis not present

## 2020-08-24 DIAGNOSIS — I471 Supraventricular tachycardia, unspecified: Secondary | ICD-10-CM

## 2020-08-24 DIAGNOSIS — Z79899 Other long term (current) drug therapy: Secondary | ICD-10-CM | POA: Insufficient documentation

## 2020-08-24 NOTE — Patient Instructions (Addendum)
Medication Instructions:  Your physician recommends that you continue on your current medications as directed. Please refer to the Current Medication list given to you today. *If you need a refill on your cardiac medications before your next appointment, please call your pharmacy*  Lab Work: You will get lab work today:  CMP, TSH and free T4  If you have labs (blood work) drawn today and your tests are completely normal, you will receive your results only by: Marland Kitchen MyChart Message (if you have MyChart) OR . A paper copy in the mail If you have any lab test that is abnormal or we need to change your treatment, we will call you to review the results.  Testing/Procedures: None ordered.  Follow-Up: At [facility], you and your health needs are our priority.  As part of our continuing mission to provide you with exceptional heart care, we have created designated Provider Care Teams.  These Care Teams include your primary Cardiologist (physician) and Advanced Practice Providers (APPs -  Physician Assistants and Nurse Practitioners) who all work together to provide you with the care you need, when you need it.  Your next appointment:   Your physician wants you to follow-up in: 6 months with Dr. Quentin Ore.   You will receive a reminder letter in the mail two months in advance. If you don't receive a letter, please call our office to schedule the follow-up appointment.

## 2020-08-24 NOTE — Progress Notes (Signed)
Electrophysiology Office Follow up Visit Note:    Date:  08/24/2020   ID:  JERMIAH HOWTON, DOB [birth year], MRN [id number]  PCP:  Jerrol Banana., MD  [facility] Cardiologist:  Ida Rogue, MD  [facility] Electrophysiologist:  Vickie Epley, MD    Interval History:    Kathryn [name] is a 87 y.o. female who presents for a follow up visit.  I last saw the patient May 11, 2020 for paroxysmal supraventricular tachycardia.  At that appointment we started amiodarone and she is currently taking 200 mg by mouth once daily.  She is tolerating her amiodarone without off target effects. She is here today with her son who I have previously met.    Past Medical History:  Diagnosis Date  . Actinic keratosis   . Aortic atherosclerosis ([place]) 05/25/2020  . Arthritis    spinal stenosis  . Cardiomyopathy - presumed to be nonischemic    a. 03/2020 Echo: EF 40-45%, gr2 DD. Nl RV fxn. Mildly dil LA. Mild MR/ao sclerosis; b. 03/2020 MV: EF 30-44%, no ischemia. Cor Ca2+ in pLAD.  Marland Kitchen CHF (congestive heart failure) ([place])   . COPD (chronic obstructive pulmonary disease) ([place])   . Coronary artery calcification seen on CT scan    a. 03/2020 MV: CT attenuation images show Ao and mod pLAD Ca2+.  . Diabetes mellitus without complication ([place])   . Gastric ulcer 4/12   treated with Prolisec- states no problems now  . Hyperlipidemia   . Hypertension    PCP Dr Miguel Aschoff   [facility]  . Hypothyroidism   . Left bundle branch block (LBBB)   . Neuromuscular disorder ([place])    slight numbness right toes- comes and goes  . Peripheral vascular disease (HCC)    varicose veins left leg  . Pneumonia   . PSVT (paroxysmal supraventricular tachycardia) ([place])    a. 03/2020 Zio: Avg HR 87 (66-211).  4 beats NSVT.  43 PSVT episodes, longest 10h 72m@ avg of 153 bpm (max 211).  Seen by EP-->amio started (pt wished to avoid EPS/RFCA).  . Seasonal allergies   . Shortness of breath   . Skin cancer     multiple from face  . Squamous cell carcinoma of skin 03/23/2013   L dorsal hand  . Squamous cell carcinoma of skin 02/04/2014   R forearm/in situ, L pretibial  . Squamous cell carcinoma of skin 09/24/2018   R thumb    Past Surgical History:  Procedure Laterality Date  . BACK SURGERY     4/12  Dr AShellia Carwin for lumbar stenosis  . BREAST CYST ASPIRATION Left 1965   negative  . BREAST SURGERY  1962   lumpectomy with biopsy   left  . CARPAL TUNNEL RELEASE     right  . COLONOSCOPY    . COLONOSCOPY WITH PROPOFOL N/A 06/30/2015   Procedure: COLONOSCOPY WITH PROPOFOL;  Surgeon: PHulen Luster MD;  [facility];  Service: Gastroenterology;  Laterality: N/A;  . COLONOSCOPY WITH PROPOFOL N/A 01/22/2017   Procedure: COLONOSCOPY WITH PROPOFOL;  Surgeon: SLollie Sails MD;  [facility];  Service: Endoscopy;  Laterality: N/A;  . COLONOSCOPY WITH PROPOFOL N/A 01/28/2019   Procedure: COLONOSCOPY WITH PROPOFOL;  Surgeon: Toledo, TBenay Pike MD;  Location: [facility] ENDOSCOPY;  Service: Gastroenterology;  Laterality: N/A;  . ESOPHAGOGASTRODUODENOSCOPY    . ESOPHAGOGASTRODUODENOSCOPY (EGD) WITH PROPOFOL N/A 01/28/2019   Procedure: ESOPHAGOGASTRODUODENOSCOPY (EGD) WITH PROPOFOL;  Surgeon: TAlice Reichert TBenay Pike MD;  Location:  [facility] ENDOSCOPY;  Service: Gastroenterology;  Laterality: N/A;  . EYE SURGERY     cataract extraction with IOL bilaterally  . JOINT REPLACEMENT    . LUMBAR LAMINECTOMY/DECOMPRESSION MICRODISCECTOMY  05/24/2011   Procedure: LUMBAR LAMINECTOMY/DECOMPRESSION MICRODISCECTOMY 2 LEVELS;  Surgeon: Magnus Sinning, MD;  Location: [facility];  Service: Orthopedics;  Laterality: N/A;  L2-L3, L3-L4 (x-ray)  . RIGHT OOPHORECTOMY     due to STAPH INFECTION    Current Medications: Current Meds  Medication Sig  . acetaminophen (TYLENOL) 500 MG tablet Take 1,000 mg by mouth every 6 (six) hours as needed. Pain   . albuterol (PROVENTIL) (2.5 MG/3ML) 0.083% nebulizer solution Take 3  mLs (2.5 mg total) by nebulization every 6 (six) hours as needed for wheezing or shortness of breath.  Marland Kitchen amiodarone (PACERONE) 200 MG tablet Take 1 tablet (200 mg) by mouth one daily as directed  . aspirin EC 81 MG tablet Take 1 tablet (81 mg total) by mouth daily. Swallow whole.  Marland Kitchen atorvastatin (LIPITOR) 40 MG tablet Take 1 tablet (40 mg total) by mouth daily.  . carvedilol (COREG) 6.25 MG tablet Take 1 tablet (6.25 mg total) by mouth 2 (two) times daily with a meal.  . dapagliflozin propanediol (FARXIGA) 10 MG TABS tablet Take 1 tablet (10 mg total) by mouth daily before breakfast.  . glucose blood (CONTOUR NEXT TEST) test strip Check sugar once daily  DX E11.9  . hydrOXYzine (ATARAX/VISTARIL) 10 MG tablet TAKE 1 TO 2 TABLETS(10 TO 20 MG) BY MOUTH EVERY 6 HOURS AS NEEDED  . levothyroxine (SYNTHROID) 100 MCG tablet TAKE 1 TABLET(100 MCG) BY MOUTH DAILY BEFORE BREAKFAST  . losartan (COZAAR) 25 MG tablet Take 1 tablet (25 mg total) by mouth every evening.  . metFORMIN (GLUCOPHAGE) 1000 MG tablet TAKE 1 TABLET(1000 MG) BY MOUTH DAILY WITH SUPPER  . omeprazole (PRILOSEC) 20 MG capsule TAKE 1 CAPSULE(20 MG) BY MOUTH DAILY  . tiotropium (SPIRIVA) 18 MCG inhalation capsule Place 1 capsule (18 mcg total) into inhaler and inhale daily as needed.  . torsemide (DEMADEX) 20 MG tablet Take 20 mg po daily in AM. Take one extra dose 8m PRN 3lb weight gain or abdominal fullness.     Allergies:   Oxycodone and Prednisone   Social History   Socioeconomic History  . Marital status: Widowed    Spouse name: Not on file  . Number of children: Not on file  . Years of education: Not on file  . Highest education level: Not on file  Occupational History  . Not on file  Tobacco Use  . Smoking status: Former Smoker    Packs/day: 0.50    Years: 50.00    Pack years: 25.00    Types: Cigarettes    Quit date: 05/16/2004    Years since quitting: 16.2  . Smokeless tobacco: Never Used  Vaping Use  . Vaping Use:  Never used  Substance and Sexual Activity  . Alcohol use: Yes    Comment: socially-1 glass of wine or [place] icea tea once a month  . Drug use: No  . Sexual activity: Yes    Birth control/protection: None  Other Topics Concern  . Not on file  Social History Narrative  . Not on file   Social Determinants of Health   Financial Resource Strain: Not on file  Food Insecurity: Not on file  Transportation Needs: Not on file  Physical Activity: Not on file  Stress: Not on file  Social Connections: Not on  file     Family History: The patient's family history includes Arthritis in her son; Breast cancer in her maternal aunt; Dementia in her mother; Heart disease in her father; Hypertension in her sister; Lung cancer in her father.  ROS:   Please see the history of present illness.    All other systems reviewed and are negative.  EKGs/Labs/Other Studies Reviewed:    The following studies were reviewed today:   EKG:  The ekg ordered today demonstrates sinus rhythm.  QRS duration 130 ms.  QTc 480 ms.  Recent Labs: 06/06/2020: ALT 17; B Natriuretic Peptide 361.8 06/07/2020: Hemoglobin 11.4; Magnesium 1.9; Platelets 263 08/16/2020: BUN 22; Creatinine, Ser 0.90; Potassium 4.8; Sodium 140  Recent Lipid Panel    Component Value Date/Time   CHOL 294 (H) 04/13/2020 0909   CHOL 167 07/29/2019 0936   TRIG 242 (H) 04/13/2020 0909   HDL 50 04/13/2020 0909   HDL 59 07/29/2019 0936   CHOLHDL 5.9 04/13/2020 0909   VLDL 48 (H) 04/13/2020 0909   LDLCALC 196 (H) 04/13/2020 0909   LDLCALC 81 07/29/2019 0936    Physical Exam:    VS:  BP 120/62   Pulse 74   Ht _0  (1.626 m)   Wt 176 lb (79.8 kg)   BMI 30.21 kg/m     Wt Readings from Last 3 Encounters:  08/24/20 176 lb (79.8 kg)  08/16/20 174 lb (78.9 kg)  07/18/20 176 lb (79.8 kg)     GEN:  Well nourished, well developed in no acute distress HEENT: Normal NECK: No JVD; No carotid bruits LYMPHATICS: No  lymphadenopathy CARDIAC: RRR, no murmurs, rubs, gallops RESPIRATORY:  Clear to auscultation without rales, wheezing or rhonchi  ABDOMEN: Soft, non-tender, non-distended MUSCULOSKELETAL:  No edema; No deformity  SKIN: Warm and dry NEUROLOGIC:  Alert and oriented x 3 PSYCHIATRIC:  Normal affect   ASSESSMENT:    1. PSVT (paroxysmal supraventricular tachycardia) ([place])   2. On amiodarone therapy   3. Encounter for long-term (current) use of high-risk medication   4. Essential hypertension   5. Chronic systolic congestive heart failure (HCC)    PLAN:    In order of problems listed above:  1. PSVT Doing well on amiodarone.  We will need to repeat CMP, TSH and free T4 during today's visit. For now, plan to continue 200 mg by mouth once daily of amiodarone.  2.  Hypertension Controlled Continue Coreg, losartan  3.  Chronic systolic heart failure NYHA class II.  Warm and dry. Continue Coreg, Farxiga, losartan, torsemide  Follow-up 6 months or sooner as needed.   Medication Adjustments/Labs and Tests Ordered: Current medicines are reviewed at length with the patient today.  Concerns regarding medicines are outlined above.  Orders Placed This Encounter  Procedures  . Comp Met (CMET)  . TSH  . T4, free  . EKG 12-Lead   No orders of the defined types were placed in this encounter.    Signed, Lars Mage, MD, [facility], [facility] 08/24/2020 7:23 PM    Electrophysiology [facility]

## 2020-08-25 LAB — COMPREHENSIVE METABOLIC PANEL
ALT: 9 IU/L (ref 0–32)
AST: 11 IU/L (ref 0–40)
Albumin/Globulin Ratio: 1.7 (ref 1.2–2.2)
Albumin: 4.5 g/dL (ref 3.6–4.6)
Alkaline Phosphatase: 95 IU/L (ref 44–121)
BUN/Creatinine Ratio: 16 (ref 12–28)
BUN: 18 mg/dL (ref 8–27)
Bilirubin Total: 0.2 mg/dL (ref 0.0–1.2)
CO2: 20 mmol/L (ref 20–29)
Calcium: 9.4 mg/dL (ref 8.7–10.3)
Chloride: 101 mmol/L (ref 96–106)
Creatinine, Ser: 1.1 mg/dL — ABNORMAL HIGH (ref 0.57–1.00)
Globulin, Total: 2.6 g/dL (ref 1.5–4.5)
Glucose: 122 mg/dL — ABNORMAL HIGH (ref 65–99)
Potassium: 4.3 mmol/L (ref 3.5–5.2)
Sodium: 141 mmol/L (ref 134–144)
Total Protein: 7.1 g/dL (ref 6.0–8.5)
eGFR: 50 mL/min/{1.73_m2} — ABNORMAL LOW (ref >59)

## 2020-08-25 LAB — TSH: TSH: 3.28 u[IU]/mL (ref 0.450–4.500)

## 2020-08-25 LAB — T4, FREE: Free T4: 1.58 ng/dL (ref 0.82–1.77)

## 2020-09-05 ENCOUNTER — Telehealth (HOSPITAL_COMMUNITY): Payer: Self-pay

## 2020-09-05 NOTE — Telephone Encounter (Signed)
Contacted Kathryn [name] to set up a home visit.  She states she is pretty sure she has covid since rest of her family has tested positive.  She has same symptoms as they, cough, congestion and fever.  They tested positive and she has not tested.  She has a HF clinic appt this week which will reschedule, contacted [name] with HF clinic.  She states feeling better today.  She states weight is down 3 lbs and doing ok.  Will visit next week.   [place] [phone]

## 2020-09-08 ENCOUNTER — Ambulatory Visit: Payer: Medicare Other | Admitting: Family

## 2020-09-12 ENCOUNTER — Other Ambulatory Visit: Payer: Self-pay | Admitting: Family Medicine

## 2020-09-12 DIAGNOSIS — E119 Type 2 diabetes mellitus without complications: Secondary | ICD-10-CM

## 2020-09-13 ENCOUNTER — Other Ambulatory Visit (HOSPITAL_COMMUNITY): Payer: Self-pay

## 2020-09-13 ENCOUNTER — Encounter (HOSPITAL_COMMUNITY): Payer: Self-pay

## 2020-09-13 NOTE — Progress Notes (Signed)
Today had a home visit with Kathryn [name]  She states doing much better from  having the [place].  She states still has a cough but no shortness of breath.  She has all her medications and aware of to take it.  She complains of her back and legs hurting.  She is out of tylenol and will get some today.  She is setting up appt with ortho to get a shot.  She is aware of up coming appts.  She watches her diet.  She has no edema in extremities or ankles.  She has no complaints of chest pain, headaches, dizziness or increased shortness of breath.  Will continue to visit for heart failure, diet and mediation compliance.   [place] [phone]

## 2020-10-03 ENCOUNTER — Other Ambulatory Visit: Payer: Self-pay

## 2020-10-03 ENCOUNTER — Ambulatory Visit (INDEPENDENT_AMBULATORY_CARE_PROVIDER_SITE_OTHER): Payer: Medicare Other | Admitting: Family Medicine

## 2020-10-03 ENCOUNTER — Encounter: Payer: Self-pay | Admitting: Family Medicine

## 2020-10-03 VITALS — BP 101/65 | HR 82 | Temp 98.2°F | Resp 16 | Ht 64.0 in | Wt 177.0 lb

## 2020-10-03 DIAGNOSIS — E1140 Type 2 diabetes mellitus with diabetic neuropathy, unspecified: Secondary | ICD-10-CM

## 2020-10-03 DIAGNOSIS — I10 Essential (primary) hypertension: Secondary | ICD-10-CM | POA: Diagnosis not present

## 2020-10-03 DIAGNOSIS — E039 Hypothyroidism, unspecified: Secondary | ICD-10-CM

## 2020-10-03 DIAGNOSIS — M48061 Spinal stenosis, lumbar region without neurogenic claudication: Secondary | ICD-10-CM | POA: Diagnosis not present

## 2020-10-03 DIAGNOSIS — I700 Atherosclerosis of aorta: Secondary | ICD-10-CM | POA: Diagnosis not present

## 2020-10-03 DIAGNOSIS — E119 Type 2 diabetes mellitus without complications: Secondary | ICD-10-CM | POA: Diagnosis not present

## 2020-10-03 DIAGNOSIS — M5116 Intervertebral disc disorders with radiculopathy, lumbar region: Secondary | ICD-10-CM | POA: Diagnosis not present

## 2020-10-03 DIAGNOSIS — E7800 Pure hypercholesterolemia, unspecified: Secondary | ICD-10-CM | POA: Diagnosis not present

## 2020-10-03 NOTE — Progress Notes (Signed)
Established patient visit   Patient: Kathryn [name]   DOB: 1934-03-04   87 y.o. Female  MRN: [id number] Visit Date: 10/03/2020  Today's healthcare provider: [name], MD   Chief Complaint  Patient presents with   Diabetes   Hypertension   Subjective    HPI  Patient comes in today for follow-up.  She has chronic pain but otherwise doing very well. Diabetes Mellitus Type II, follow-up  Lab Results  Component Value Date   HGBA1C 7.4 (A) 07/04/2020   HGBA1C 6.9 (H) 03/27/2020   HGBA1C 7.9 (H) 12/01/2019   Last seen for diabetes 3 months ago.  Management since then includes; advised to work on diet and exercise.  Try to avoid hypoglycemia with meds. She reports good compliance with treatment. She is not having side effects.   Home blood sugar records: trend: stable  Episodes of hypoglycemia? No    Current insulin regiment: none Most Recent Eye Exam: due  Hypertension, follow-up  BP Readings from Last 3 Encounters:  10/03/20 101/65  09/13/20 108/60  08/24/20 120/62   Wt Readings from Last 3 Encounters:  10/03/20 177 lb (80.3 kg)  09/13/20 174 lb (78.9 kg)  08/24/20 176 lb (79.8 kg)     She was last seen for hypertension 3 months ago.  BP at that visit was 114/66. Management since that visit includes; controlled. She reports good compliance with treatment. She is not having side effects.  She is not exercising. She is adherent to low salt diet.   Outside blood pressures are checked occasionally.  She does not smoke.  Use of agents associated with hypertension: none.   --------------------------------------------------------------------------------------------------- Lipid/Cholesterol, follow-up  Last Lipid Panel: Lab Results  Component Value Date   CHOL 294 (H) 04/13/2020   LDLCALC 196 (H) 04/13/2020   HDL 50 04/13/2020   TRIG 242 (H) 04/13/2020    She was last seen for this 6 months ago.  Management since that visit includes; labs  checked showing-recommended increase to Crestor 40mg  daily. Repeat lipid and liver function in 6-8 weeks.   She reports good compliance with treatment. She is not having side effects.   She is following a Regular diet. Current exercise: no regular exercise  Last metabolic panel Lab Results  Component Value Date   GLUCOSE 122 (H) 08/24/2020   NA 141 08/24/2020   K 4.3 08/24/2020   BUN 18 08/24/2020   CREATININE 1.10 (H) 08/24/2020   GFRNONAA >60 08/16/2020   GFRAA 84 07/29/2019   CALCIUM 9.4 08/24/2020   AST 11 08/24/2020   ALT 9 08/24/2020   The ASCVD Risk score (Goff DC Jr., et al., 2013) failed to calculate for the following reasons:   The 2013 ASCVD risk score is only valid for ages 57 to 50       Medications: Outpatient Medications Prior to Visit  Medication Sig   acetaminophen (TYLENOL) 500 MG tablet Take 1,000 mg by mouth every 6 (six) hours as needed. Pain    albuterol (PROVENTIL) (2.5 MG/3ML) 0.083% nebulizer solution Take 3 mLs (2.5 mg total) by nebulization every 6 (six) hours as needed for wheezing or shortness of breath.   amiodarone (PACERONE) 200 MG tablet Take 1 tablet (200 mg) by mouth one daily as directed   aspirin EC 81 MG tablet Take 1 tablet (81 mg total) by mouth daily. Swallow whole.   atorvastatin (LIPITOR) 40 MG tablet Take 1 tablet (40 mg total) by mouth daily.   carvedilol (  COREG) 6.25 MG tablet Take 1 tablet (6.25 mg total) by mouth 2 (two) times daily with a meal.   dapagliflozin propanediol (FARXIGA) 10 MG TABS tablet Take 1 tablet (10 mg total) by mouth daily before breakfast.   glucose blood (CONTOUR NEXT TEST) test strip Check sugar once daily  DX E11.9   hydrOXYzine (ATARAX/VISTARIL) 10 MG tablet TAKE 1 TO 2 TABLETS(10 TO 20 MG) BY MOUTH EVERY 6 HOURS AS NEEDED   levothyroxine (SYNTHROID) 100 MCG tablet TAKE 1 TABLET(100 MCG) BY MOUTH DAILY BEFORE BREAKFAST   losartan (COZAAR) 25 MG tablet Take 1 tablet (25 mg total) by mouth every evening.    metFORMIN (GLUCOPHAGE) 1000 MG tablet TAKE 1 TABLET(1000 MG) BY MOUTH DAILY WITH SUPPER   omeprazole (PRILOSEC) 20 MG capsule TAKE 1 CAPSULE(20 MG) BY MOUTH DAILY   tiotropium (SPIRIVA) 18 MCG inhalation capsule Place 1 capsule (18 mcg total) into inhaler and inhale daily as needed.   torsemide (DEMADEX) 20 MG tablet Take 20 mg po daily in AM. Take one extra dose 20mg  PRN 3lb weight gain or abdominal fullness.   No facility-administered medications prior to visit.    Review of Systems  Constitutional:  Negative for appetite change, chills, fatigue and fever.  Respiratory:  Negative for chest tightness and shortness of breath.   Cardiovascular:  Negative for chest pain and palpitations.  Gastrointestinal:  Negative for abdominal pain, nausea and vomiting.  Neurological:  Negative for dizziness and weakness.       Objective    BP 101/65   Pulse 82   Temp 98.2 F (36.8 C)   Resp 16   Ht 5\' 4"  (1.626 m)   Wt 177 lb (80.3 kg)   BMI 30.38 kg/m  BP Readings from Last 3 Encounters:  10/03/20 101/65  09/13/20 108/60  08/24/20 120/62   Wt Readings from Last 3 Encounters:  10/03/20 177 lb (80.3 kg)  09/13/20 174 lb (78.9 kg)  08/24/20 176 lb (79.8 kg)       Physical Exam Vitals and nursing note reviewed.  Constitutional:      Appearance: Normal appearance. She is normal weight.  HENT:     Right Ear: Tympanic membrane normal.     Left Ear: Tympanic membrane normal.     Nose: Nose normal.     Mouth/Throat:     Mouth: Mucous membranes are moist.     Pharynx: Oropharynx is clear.  Eyes:     General: No scleral icterus.    Conjunctiva/sclera: Conjunctivae normal.  Cardiovascular:     Rate and Rhythm: Normal rate and regular rhythm.     Pulses: Normal pulses.     Heart sounds: Normal heart sounds.  Pulmonary:     Effort: Pulmonary effort is normal.     Breath sounds: Normal breath sounds.  Abdominal:     General: Bowel sounds are normal.     Palpations: Abdomen is  soft.  Musculoskeletal:     Cervical back: Normal range of motion and neck supple.     Comments: Trace. pedal edema in ankles  Skin:    General: Skin is warm and dry.  Neurological:     Mental Status: She is alert.  Psychiatric:        Mood and Affect: Mood normal.        Behavior: Behavior normal.        Thought Content: Thought content normal.        Judgment: Judgment normal.  No results found for any visits on 10/03/20.  Assessment & Plan     1. Type 2 diabetes mellitus with diabetic neuropathy, without long-term current use of insulin (HCC) 1C has been under good control.  Continue Farxiga and metformin  2. Type 2 diabetes mellitus without complication, without long-term current use of insulin (HCC) Neuropathy present - Hemoglobin A1c  3. Essential hypertension Good control.  Watch for hypotension.  4. Pure hypercholesterolemia On atorvastatin 40 - Lipid panel  5. Hypothyroidism, unspecified type Follow TSH - TSH  6. Aortic atherosclerosis ([place]) All risk factors treated  7. Lumbar disc disease with radiculopathy Chronic pain under fair control.  8. Spinal stenosis of lumbar region without neurogenic claudication Doing okay   No follow-ups on file.      I, [name], MD, have reviewed all documentation for this visit. The documentation on 10/07/20 for the exam, diagnosis, procedures, and orders are all accurate and complete.    Mashawn Brazil Cranford Mon, MD  [facility] [phone] (phone) [phone] (fax)  [place]

## 2020-10-04 LAB — LIPID PANEL
Chol/HDL Ratio: 3.2 ratio (ref 0.0–4.4)
Cholesterol, Total: 177 mg/dL (ref 100–199)
HDL: 55 mg/dL (ref >39)
LDL Chol Calc (NIH): 83 mg/dL (ref 0–99)
Triglycerides: 239 mg/dL — ABNORMAL HIGH (ref 0–149)
VLDL Cholesterol Cal: 39 mg/dL (ref 5–40)

## 2020-10-04 LAB — TSH: TSH: 3.68 u[IU]/mL (ref 0.450–4.500)

## 2020-10-04 LAB — HEMOGLOBIN A1C
Est. average glucose Bld gHb Est-mCnc: 160 mg/dL
Hgb A1c MFr Bld: 7.2 % — ABNORMAL HIGH (ref 4.8–5.6)

## 2020-10-11 ENCOUNTER — Telehealth: Payer: Self-pay | Admitting: *Deleted

## 2020-10-11 NOTE — Chronic Care Management (AMB) (Signed)
  Chronic Care Management   Note  10/11/2020 Name: Kathryn [name] MRN: [id number] DOB: 1933/09/27  Kathryn [name] is a 87 y.o. year old female who is a primary care patient of Jerrol Banana., MD. I reached out to Thom Chimes by phone today in response to a referral sent by Kathryn [name]'s PCP Jerrol Banana., MD     Kathryn [name] was given information about Chronic Care Management services today including:  CCM service includes personalized support from designated clinical staff supervised by her physician, including individualized plan of care and coordination with other care providers 24/7 contact phone numbers for assistance for urgent and routine care needs. Service will only be billed when office clinical staff spend 20 minutes or more in a month to coordinate care. Only one practitioner may furnish and bill the service in a calendar month. The patient may stop CCM services at any time (effective at the end of the month) by phone call to the office staff. The patient will be responsible for cost sharing (co-pay) of up to 20% of the service fee (after annual deductible is met).  Patient agreed to services and verbal consent obtained.   Follow up plan: Telephone appointment with care management team member scheduled for: 10/18/2020  Julian Hy, [place] [facility]  Direct Dial: [phone]

## 2020-10-16 NOTE — Progress Notes (Signed)
Cardiology Office Note  Date:  10/17/2020   ID:  Kathryn [name], DOB [birth year], MRN [id number]  PCP:  Jerrol Banana., MD   Chief Complaint  Patient presents with   Other    3 month follow up. Meds reviewed verbally with patient.     HPI:  Kathryn [name] is a 87 yo woman with  long smoking history for 50 years , stopped 6-7 years ago,  COPD back surgery x2,  chronic back pain with pain radiating down her left leg, possible hip arthritis, chronic shortness of breath  Pulmonary HTN/flash pulm HTN EF 40 to 45% January 2022  hyperlipidemia  who presents for f/u of her shortness of breath, hyperlipidemia, diastolic CHF  LOV [id number] In hospital x2 in 05/2020 for COPD/CHF No hospitalizations since that time  Takes torsemide 20 mg daily Rarely taking extra torsemide Does not eat out much Does not drive Family has to drive her everywhere  Has significant back and lower extremity arthritides, chronic pain Sedentary, no exercise program Denies chest pain or shortness of breath Blood pressure stable at home  hospitalized from 3/1 through 3/4 for COPD and CHF exacerbations. Again 06/08/2020  EKG personally reviewed by myself on todays visit Normal sinus rhythm left bundle branch block Unchanged  Other past medical history reviewed home  in the hospital 03/27/2020  Presented with leg edema and breathing difficulty. placed on BiPAP and IV Lasix started,  DuoNeb, steroids, and Nitropaste.  Echocardiogram : mildly reduced LVSF (EF 40 to 45%),  left bundle branch block of unknown duration.   Some question of whether there was atrial fibrillation seen on her monitor  seen in the heart failure clinic 04/04/2020.   moderate shortness of breath with exertion.    Zio monitor    PCP 04/05/2020.    improvement in her breathing per documentation.   04/12/2020,   unable to sleep over the weekend / overnight.   feeling poorly and that symptoms similar to that before her admission    racing heart rate on Sunday night, 04/10/20.  atrial fib  Lasix up to 40 for 3 days then back to 20 daily Started on crestor 5 daily  Stress test ordered that showed no significant ischemia Ejection fraction 30 to 44% CT attenuation correction images with aortic calcification, coronary calcification proximal LAD region (Echocardiogram March 28, 2020 estimated ejection fraction 40 to 45%)   PMH:   has a past medical history of Actinic keratosis, Aortic atherosclerosis ([place]) (05/25/2020), Arthritis, Cardiomyopathy - presumed to be nonischemic, CHF (congestive heart failure) ([place]), COPD (chronic obstructive pulmonary disease) ([place]), Coronary artery calcification seen on CT scan, Diabetes mellitus without complication ([place]), Gastric ulcer (4/12), Hyperlipidemia, Hypertension, Hypothyroidism, Left bundle branch block (LBBB), Neuromuscular disorder ([place]), Peripheral vascular disease ([place]), Pneumonia, PSVT (paroxysmal supraventricular tachycardia) ([place]), Seasonal allergies, Shortness of breath, Skin cancer, Squamous cell carcinoma of skin (03/23/2013), Squamous cell carcinoma of skin (02/04/2014), and Squamous cell carcinoma of skin (09/24/2018).  PSH:    Past Surgical History:  Procedure Laterality Date   BACK SURGERY     4/12  Dr Shellia Carwin- for lumbar stenosis   BREAST CYST ASPIRATION Left 1965   negative   [place]   lumpectomy with biopsy   left   CARPAL TUNNEL RELEASE     right   COLONOSCOPY     COLONOSCOPY WITH PROPOFOL N/A 06/30/2015   Procedure: COLONOSCOPY WITH PROPOFOL;  Surgeon: Hulen Luster, MD;  Location[facility]COPY;  Service: Gastroenterology;  Laterality: N/A;   COLONOSCOPY WITH PROPOFOL N/A 01/22/2017   Procedure: COLONOSCOPY WITH PROPOFOL;  Surgeon: Lollie Sails, MD;  Location: [facility] ENDOSCOPY;  Service: Endoscopy;  Laterality: N/A;   COLONOSCOPY WITH PROPOFOL N/A 01/28/2019   Procedure: COLONOSCOPY WITH PROPOFOL;  Surgeon: Tol[place], Benay Pike, MD;  Location: [facility]  ENDOSCOPY;  Service: Gastroenterology;  Laterality: N/A;   ESOPHAGOGASTRODUODENOSCOPY     ESOPHAGOGASTRODUODENOSCOPY (EGD) WITH PROPOFOL N/A 01/28/2019   Procedure: ESOPHAGOGASTRODUODENOSCOPY (EGD) WITH PROPOFOL;  Surgeon: Toledo, Benay Pike, MD;  Location: [facility] ENDOSCOPY;  Service: Gastroenterology;  Laterality: N/A;   EYE SURGERY     cataract extraction with IOL bilaterally   JOINT REPLACEMENT     LUMBAR LAMINECTOMY/DECOMPRESSION MICRODISCECTOMY  05/24/2011   Procedure: LUMBAR LAMINECTOMY/DECOMPRESSION MICRODISCECTOMY 2 LEVELS;  Surgeon: Magnus Sinning, MD;  Location: [facility] ORS;  Service: Orthopedics;  Laterality: N/A;  L2-L3, L3-L4 (x-ray)   RIGHT OOPHORECTOMY     due to STAPH INFECTION    Current Outpatient Medications  Medication Sig Dispense Refill   acetaminophen (TYLENOL) 500 MG tablet Take 1,000 mg by mouth every 6 (six) hours as needed. Pain      albuterol (PROVENTIL) (2.5 MG/3ML) 0.083% nebulizer solution Take 3 mLs (2.5 mg total) by nebulization every 6 (six) hours as needed for wheezing or shortness of breath. 150 mL 12   amiodarone (PACERONE) 200 MG tablet Take 1 tablet (200 mg) by mouth one daily as directed 90 tablet 1   aspirin EC 81 MG tablet Take 1 tablet (81 mg total) by mouth daily. Swallow whole. 90 tablet 3   atorvastatin (LIPITOR) 40 MG tablet Take 1 tablet (40 mg total) by mouth daily. 90 tablet 3   carvedilol (COREG) 6.25 MG tablet Take 1 tablet (6.25 mg total) by mouth 2 (two) times daily with a meal. 180 tablet 3   dapagliflozin propanediol (FARXIGA) 10 MG TABS tablet Take 1 tablet (10 mg total) by mouth daily before breakfast. 30 tablet 5   glucose blood (CONTOUR NEXT TEST) test strip Check sugar once daily  DX E11.9 100 each 3   hydrOXYzine (ATARAX/VISTARIL) 10 MG tablet TAKE 1 TO 2 TABLETS(10 TO 20 MG) BY MOUTH EVERY 6 HOURS AS NEEDED 100 tablet 1   levothyroxine (SYNTHROID) 100 MCG tablet TAKE 1 TABLET(100 MCG) BY MOUTH DAILY BEFORE BREAKFAST 90 tablet 3    losartan (COZAAR) 25 MG tablet Take 1 tablet (25 mg total) by mouth every evening. 90 tablet 3   metFORMIN (GLUCOPHAGE) 1000 MG tablet TAKE 1 TABLET(1000 MG) BY MOUTH DAILY WITH SUPPER 30 tablet 3   omeprazole (PRILOSEC) 20 MG capsule TAKE 1 CAPSULE(20 MG) BY MOUTH DAILY 90 capsule 0   tiotropium (SPIRIVA) 18 MCG inhalation capsule Place 1 capsule (18 mcg total) into inhaler and inhale daily as needed. 30 capsule 12   torsemide (DEMADEX) 20 MG tablet Take 20 mg po daily in AM. Take one extra dose '20mg'$  PRN 3lb weight gain or abdominal fullness. 120 tablet 1   No current facility-administered medications for this visit.     Allergies:   Oxycodone and Prednisone   Social History:  The patient  reports that she quit smoking about 16 years ago. Her smoking use included cigarettes. She has a 25.00 pack-year smoking history. She has never used smokeless tobacco. She reports current alcohol use. She reports that she does not use drugs.   Family History:   family history includes Arthritis in her son; Breast cancer in her maternal aunt; Dementia in her mother;  Heart disease in her father; Hypertension in her sister; Lung cancer in her father.    Review of Systems: Review of Systems  Constitutional: Negative.   HENT: Negative.    Respiratory: Negative.    Cardiovascular: Negative.   Gastrointestinal: Negative.   Musculoskeletal: Negative.   Neurological: Negative.   Psychiatric/Behavioral: Negative.    All other systems reviewed and are negative.  PHYSICAL EXAM: VS:  BP (!) 122/58 (BP Location: Right Arm, Patient Position: Sitting, Cuff Size: Normal)   Pulse 76   Ht '5\' 4"'$  (1.626 m)   Wt 176 lb (79.8 kg)   SpO2 93%   BMI 30.21 kg/m  , BMI Body mass index is 30.21 kg/m. Constitutional:  oriented to person, place, and time. No distress.  HENT:  Head: Grossly normal Eyes:  no discharge. No scleral icterus.  Neck: No JVD, no carotid bruits  Cardiovascular: Regular rate and rhythm, no  murmurs appreciated Pulmonary/Chest: Clear to auscultation bilaterally, no wheezes or rails Abdominal: Soft.  no distension.  no tenderness.  Musculoskeletal: Normal range of motion Neurological:  normal muscle tone. Coordination normal. No atrophy Skin: Skin warm and dry Psychiatric: normal affect, pleasant  Recent Labs: 06/06/2020: B Natriuretic Peptide 361.8 06/07/2020: Hemoglobin 11.4; Magnesium 1.9; Platelets 263 08/24/2020: ALT 9; BUN 18; Creatinine, Ser 1.10; Potassium 4.3; Sodium 141 10/03/2020: TSH 3.680    Lipid Panel Lab Results  Component Value Date   CHOL 177 10/03/2020   HDL 55 10/03/2020   LDLCALC 83 10/03/2020   TRIG 239 (H) 10/03/2020     Wt Readings from Last 3 Encounters:  10/17/20 176 lb (79.8 kg)  10/03/20 177 lb (80.3 kg)  09/13/20 174 lb (78.9 kg)     ASSESSMENT AND PLAN:  Problem List Items Addressed This Visit       Cardiology Problems   Essential hypertension   Relevant Orders   EKG 12-Lead   PSVT (paroxysmal supraventricular tachycardia) ([place]) - Primary   Relevant Orders   EKG 12-Lead     Other   Diabetes mellitus type 2, uncomplicated ([place])   Other Visit Diagnoses     Chronic systolic congestive heart failure ([place])       Relevant Orders   EKG 12-Lead   Atrial tachycardia, paroxysmal (H[facility])       Relevant Orders   EKG 12-Lead   V-tach [facility])       Relevant Orders   EKG 12-Lead     COPD/emphysema Reports symptoms are stable, no longer eating out, takes torsemide daily Feels her breathing is stable Weight stable, takes extra torsemide for ankle lower abdominal swelling  Cardiomyopathy Ejection fraction 40 to 45% in January 2022 Continue Coreg, Farxiga, losartan " No room in pillbox for more medications"  Atrial tachycardia Continue beta-blocker at current dose  Hyperlipidemia Continue Lipitor  Coronary disease with stable angina Currently with no symptoms of angina. No further workup at this time. Continue current  medication regimen.    Total encounter time more than 25 minutes  Greater than 50% was spent in counseling and coordination of care with the patient   Signed, Esmond Plants, M.D., Ph.D. [place], [place]

## 2020-10-17 ENCOUNTER — Ambulatory Visit (INDEPENDENT_AMBULATORY_CARE_PROVIDER_SITE_OTHER): Payer: Medicare Other | Admitting: Cardiovascular Disease

## 2020-10-17 ENCOUNTER — Other Ambulatory Visit: Payer: Self-pay

## 2020-10-17 ENCOUNTER — Encounter: Payer: Self-pay | Admitting: Cardiovascular Disease

## 2020-10-17 VITALS — BP 122/58 | HR 76 | Ht 64.0 in | Wt 176.0 lb

## 2020-10-17 DIAGNOSIS — I10 Essential (primary) hypertension: Secondary | ICD-10-CM | POA: Diagnosis not present

## 2020-10-17 DIAGNOSIS — I4719 Other supraventricular tachycardia: Secondary | ICD-10-CM

## 2020-10-17 DIAGNOSIS — I471 Supraventricular tachycardia, unspecified: Secondary | ICD-10-CM

## 2020-10-17 DIAGNOSIS — I5022 Chronic systolic (congestive) heart failure: Secondary | ICD-10-CM | POA: Diagnosis not present

## 2020-10-17 DIAGNOSIS — I472 Ventricular tachycardia, unspecified: Secondary | ICD-10-CM

## 2020-10-17 DIAGNOSIS — E119 Type 2 diabetes mellitus without complications: Secondary | ICD-10-CM

## 2020-10-17 NOTE — Patient Instructions (Signed)
Medication Instructions:  No changes  If you need a refill on your cardiac medications before your next appointment, please call your pharmacy.    Lab work: No new labs needed   If you have labs (blood work) drawn today and your tests are completely normal, you will receive your results only by: MyChart Message (if you have MyChart) OR A paper copy in the mail If you have any lab test that is abnormal or we need to change your treatment, we will call you to review the results.   Testing/Procedures: No new testing needed   Follow-Up: At [facility], you and your health needs are our priority.  As part of our continuing mission to provide you with exceptional heart care, we have created designated Provider Care Teams.  These Care Teams include your primary Cardiologist (physician) and Advanced Practice Providers (APPs -  Physician Assistants and Nurse Practitioners) who all work together to provide you with the care you need, when you need it.  You will need a follow up appointment in 12 months  Providers on your designated Care Team:   Christopher Berge, NP Ryan Dunn, PA-C Jacquelyn Visser, PA-C Cadence Furth, PA-C  Any Other Special Instructions Will Be Listed Below (If Applicable).  COVID-19 Vaccine Information can be found at: [email or web address] For questions related to vaccine distribution or appointments, please email [email or web address] or call [phone]    

## 2020-10-18 ENCOUNTER — Ambulatory Visit (INDEPENDENT_AMBULATORY_CARE_PROVIDER_SITE_OTHER): Payer: Medicare Other

## 2020-10-18 DIAGNOSIS — I5043 Acute on chronic combined systolic (congestive) and diastolic (congestive) heart failure: Secondary | ICD-10-CM | POA: Diagnosis not present

## 2020-10-18 DIAGNOSIS — J449 Chronic obstructive pulmonary disease, unspecified: Secondary | ICD-10-CM | POA: Diagnosis not present

## 2020-10-18 DIAGNOSIS — E119 Type 2 diabetes mellitus without complications: Secondary | ICD-10-CM

## 2020-10-18 NOTE — Chronic Care Management (AMB) (Signed)
Chronic Care Management   CCM RN Visit Note  10/18/2020 Name: Kathryn [name] MRN: [id number] DOB: [birth year]  Subjective: Kathryn [name] is a 87 y.o. year old female who is a primary care patient of Jerrol Banana., MD. The care management team was consulted for assistance with disease management and care coordination needs.    Engaged with patient by telephone for initial visit in response to provider referral for case management and/or care coordination services.   Consent to Services:  The patient was given the following information about Chronic Care Management services today, agreed to services, and gave verbal consent: 1. CCM service includes personalized support from designated clinical staff supervised by the primary care provider, including individualized plan of care and coordination with other care providers 2. 24/7 contact phone numbers for assistance for urgent and routine care needs. 3. Service will only be billed when office clinical staff spend 20 minutes or more in a month to coordinate care. 4. Only one practitioner may furnish and bill the service in a calendar month. 5.The patient may stop CCM services at any time (effective at the end of the month) by phone call to the office staff. 6. The patient will be responsible for cost sharing (co-pay) of up to 20% of the service fee (after annual deductible is met). Patient agreed to services and consent obtained.   Assessment: Review of patient past medical history, allergies, medications, health status, including review of consultants reports, laboratory and other test data, was performed as part of comprehensive evaluation and provision of chronic care management services.   SDOH (Social Determinants of Health) assessments and interventions performed:  SDOH Interventions    Flowsheet Row Most Recent Value  SDOH Interventions   Food Insecurity Interventions Intervention Not Indicated  Transportation Interventions  Intervention Not Indicated        CCM Care Plan  Allergies  Allergen Reactions   Oxycodone Other (See Comments)    Mental status change   Prednisone     swelling, bruising.    Outpatient Encounter Medications as of 10/18/2020  Medication Sig   acetaminophen (TYLENOL) 500 MG tablet Take 1,000 mg by mouth every 6 (six) hours as needed. Pain    albuterol (PROVENTIL) (2.5 MG/3ML) 0.083% nebulizer solution Take 3 mLs (2.5 mg total) by nebulization every 6 (six) hours as needed for wheezing or shortness of breath.   amiodarone (PACERONE) 200 MG tablet Take 1 tablet (200 mg) by mouth one daily as directed   atorvastatin (LIPITOR) 40 MG tablet Take 1 tablet (40 mg total) by mouth daily.   carvedilol (COREG) 6.25 MG tablet Take 1 tablet (6.25 mg total) by mouth 2 (two) times daily with a meal.   dapagliflozin propanediol (FARXIGA) 10 MG TABS tablet Take 1 tablet (10 mg total) by mouth daily before breakfast.   hydrOXYzine (ATARAX/VISTARIL) 10 MG tablet TAKE 1 TO 2 TABLETS(10 TO 20 MG) BY MOUTH EVERY 6 HOURS AS NEEDED   levothyroxine (SYNTHROID) 100 MCG tablet TAKE 1 TABLET(100 MCG) BY MOUTH DAILY BEFORE BREAKFAST   losartan (COZAAR) 25 MG tablet Take 1 tablet (25 mg total) by mouth every evening.   metFORMIN (GLUCOPHAGE) 1000 MG tablet TAKE 1 TABLET(1000 MG) BY MOUTH DAILY WITH SUPPER   omeprazole (PRILOSEC) 20 MG capsule TAKE 1 CAPSULE(20 MG) BY MOUTH DAILY   tiotropium (SPIRIVA) 18 MCG inhalation capsule Place 1 capsule (18 mcg total) into inhaler and inhale daily as needed.   torsemide (DEMADEX) 20 MG tablet Take 20  mg po daily in AM. Take one extra dose 31m PRN 3lb weight gain or abdominal fullness.   aspirin EC 81 MG tablet Take 1 tablet (81 mg total) by mouth daily. Swallow whole.   glucose blood (CONTOUR NEXT TEST) test strip Check sugar once daily  DX E11.9   No facility-administered encounter medications on file as of 10/18/2020.    Patient Active Problem List   Diagnosis Date  Noted   On amiodarone therapy 08/24/2020   Acute on chronic combined systolic and diastolic CHF (congestive heart failure) (HCC) 06/06/2020   PSVT (paroxysmal supraventricular tachycardia) (HCC)    AKI (acute kidney injury) ([facility] 05/26/2020   Aortic atherosclerosis ([facility] 05/25/2020   Acute on chronic respiratory failure with hypoxia ([facility] 05/24/2020   COPD exacerbation ([facility] 05/24/2020   Diabetes mellitus without complication ([facility] 016/12/9602  Acute CHF ([facility] 03/27/2020   Acute [place] (congestive heart failure) ([facility] 03/27/2020   Acute respiratory failure with hypoxia (HCC)    Diabetes mellitus type 2, uncomplicated ([facility] 054/11/8117  COPD, mild ([facility] 09/17/2014   Clinical depression 09/17/2014   Elevated platelet count 09/17/2014   Elevated erythrocyte sedimentation rate 09/17/2014   History of tobacco use 09/17/2014   Hypercholesteremia 09/17/2014   BP (high blood pressure) 09/17/2014   Hypothyroidism 09/17/2014   Cannot sleep 09/17/2014   Lumbar disc disease with radiculopathy 09/17/2014   Arthritis, degenerative 09/17/2014   Excess weight 09/17/2014   Vertebrobasilar circulation transient ischemic attack 09/17/2014   Lung nodule, multiple 09/17/2014   Basilar artery insufficiency 09/17/2014   Neuritis or radiculitis due to rupture of lumbar intervertebral disc 08/25/2013   Degenerative arthritis of lumbar spine 08/25/2013   Lumbar canal stenosis 08/25/2013   Smoker 07/15/2012   Back pain 07/15/2012   SOB (shortness of breath) 07/15/2012   Hyperlipidemia 07/15/2012   Essential hypertension 07/15/2012    Conditions to be addressed/monitored:CHF, COPD, and DMII  Patient Care Plan: Heart Failure (Adult)     Problem Identified: Symptom Exacerbation (Heart Failure)      Long-Range Goal: Symptom Exacerbation Prevented or Minimized   Start Date: 10/18/2020  Expected End Date: 02/15/2021  Priority: High  Note:   Current Barriers:  Chronic Disease Management support and  educational needs r/t CHF.   Case Manager Clinical Goal(s):  Over the next 120 days, patient will not require hospitalization or emergent care d/t complications r/t CHF.   Interventions:  Collaboration with GJerrol Banana, MD regarding development and update of comprehensive plan of care as evidenced by provider attestation and co-signature Inter-disciplinary care team collaboration (see longitudinal plan of care) Discussed current plan for CHF management and reviewed medications. Reports excellent compliance. Reports adjusting diuretic as advised by her provider for increased swelling and weight gain.  Provided information regarding recommended weight parameters. Encouraged to weigh daily and record readings. Encouraged to notify provider for weight gain greater than 3 lbs overnight or weight gain greater than 5 lbs within a week. Reports weight today was 174 lbs. Discussed diet and nutritional intake. Reports monitoring her intake and attempting to follow a heart healthy diet. Advised to monitor sodium intake and avoid highly processed foods when possible. Discussed s/sx of fluid overload and indications for notifying a provider. Denies chest pain or palpitations. Denies episodes of shortness of breath. Denies increased abdominal edema. Reports her lower extremities(mainly ankles) are always swollen during the warmer months. Reports previously being advised to wear compression hose but she prefers not to wear them. Reports keeping lower extremities  elevated. Reports she is able to walk short distances but ambulation is limited d/t chronic back pain. Denies recent changes in activity tolerance but reports she is unable to tolerate standing for prolonged periods.  Reviewed worsening s/sx related to CHF exacerbation and indications for seeking immediate medical attention.   Self-Care/Patient Goals: Take all medications as prescribed Monitor weight and record readings Adhere to recommended  cardiac prudent/heart healthy diet Notify provider for weight gain outside of established parameters Contact provider or care management team with questions and new concerns as needed    Follow Up Plan:  Will follow up in two months    Patient Care Plan: COPD (Adult)     Problem Identified: Symptom Exacerbation (COPD)      Long-Range Goal: Symptom Exacerbation Prevented or Minimized   Start Date: 10/18/2020  Expected End Date: 02/15/2021  Priority: High  Note:   Current Barriers:  Chronic Disease Management support and educational needs r/t COPD.  Case Manager Clinical Goal(s): Over the next 120 days, patient will not require hospitalization or emergent care due to complications related to COPD.  Interventions:  Collaboration with Jerrol Banana., MD regarding development and update of comprehensive plan of care as evidenced by provider attestation and co-signature Inter-disciplinary care team collaboration (see longitudinal plan of care) Reviewed medications.  Encouraged to continue taking medications and utilizing inhalers as prescribed. Encouraged to notify the care management team with concerns regarding prescription cost. Provided verbal COPD education on self care and exacerbation prevention. Reports symptoms are usually triggered by heat during warmer months. She avoids outdoor activity during the heat of the day to prevent complications. Discussed importance of adequate hydration and taking frequent breaks as needed. Encouraged to take needed precautions and avoid extreme temperatures to prevent exacerbation. Discussed current action plan and reinforced importance of daily self assessment. Advised to make an appointment with her provider if in the yellow zone for 48 hours without improvement. Reviewed worsening symptoms that require immediate medical attention.  Provided information regarding infection prevention and increased risk r/t COPD.  Advised to utilize   prevention strategies to reduce risk of respiratory infection.     Self-Care/Patient Goals: Self administer medications as prescribed Attend all scheduled provider appointments Assess symptoms daily and notify provider if not improving  Call pharmacy for medication refills Calls provider office for new concerns or questions   Follow Up Plan:  Will follow up in two months    Patient Care Plan: Diabetes Type 2 (Adult)     Problem Identified: Glycemic Management (Diabetes, Type 2)      Long-Range Goal: Glycemic Management Optimized   Start Date: 10/18/2020  Expected End Date: 02/15/2021  Priority: Medium  Note:   Objective:  Lab Results  Component Value Date   HGBA1C 7.2 (H) 10/03/2020   Lab Results  Component Value Date   CREATININE 1.10 (H) 08/24/2020   CREATININE 0.90 08/16/2020   CREATININE 1.00 06/08/2020   Lab Results  Component Value Date   EGFR 50 (L) 08/24/2020    Current Barriers:  Chronic Disease Management support and educational needs related to Diabetes self management.  Case Manager Clinical Goal(s):  Over the next 120 days, patient will demonstrate improved adherence to prescribed treatment plan for diabetes self care/management as evidenced by: monitoring and recording CBG's, adherence to ADA/ carb modified diet and adherence to prescribed medication regimen.  Interventions:  Collaboration with Jerrol Banana., MD regarding development and update of comprehensive plan of care as evidenced by  provider attestation and co-signature Inter-disciplinary care team collaboration (see longitudinal plan of care) Reviewed medications with patient and discussed importance of medication adherence. Discussed importance of consistent blood glucose monitoring. Advised to monitor and maintain a log. Reports having a glucometer but she prefers not to monitor fasting blood glucose levels. Provided information regarding s/sx of hypoglycemia and hyperglycemia along  with appropriate interventions. Strongly advised to monitor a few times a week if unable to monitor daily and record readings.  Discussed nutritional intake and importance of adhering to a carb modified/diabetic diet. Reviewed recommended DM exams. Advised to perform foot care daily and notify provider with changes. Reports last eye exam was over two years ago. Denies changes in vision. Advised to contact the Ophthalmology clinic later this week to schedule an eye exam.  Discussed available DM resources and offered referral for DM education and nutrition. Declined need for additional resources.    Self-Care/Patient Goals Self administer medications as prescribed Attend  scheduled provider appointments Check blood sugars and record readings Adhere to prescribed ADA/carb modified Contact Ophthalmology clinic to schedule an eye exam   Follow Up Plan:  Will follow up in two months     PLAN: A member of the care management team will follow up with Mrs. Dorian in two months.   [facility] Care Management [facility] [phone]

## 2020-10-20 ENCOUNTER — Other Ambulatory Visit (HOSPITAL_COMMUNITY): Payer: Self-pay

## 2020-10-20 NOTE — Progress Notes (Signed)
Had a telephone appt with Kathryn [name]  She states been doing good.  Staying inside from the heat.  She had a good cardiology appt and released for a year.  She has a ll her medications and aware how to take them.  She denies any problems.  She stats had a great birthday and recovered from covid and no one has gotten sick. Will make home visit next week.  Will visit for heart failure, diet and medication management.   [name] [phone]

## 2020-10-21 DIAGNOSIS — M48062 Spinal stenosis, lumbar region with neurogenic claudication: Secondary | ICD-10-CM | POA: Diagnosis not present

## 2020-10-21 DIAGNOSIS — M5126 Other intervertebral disc displacement, lumbar region: Secondary | ICD-10-CM | POA: Diagnosis not present

## 2020-10-21 DIAGNOSIS — M5416 Radiculopathy, lumbar region: Secondary | ICD-10-CM | POA: Diagnosis not present

## 2020-10-24 NOTE — Patient Instructions (Addendum)
Thank you for allowing the Chronic Care Management team to participate in your care.   Goals Addressed: Patient Care Plan: Heart Failure (Adult)     Problem Identified: Symptom Exacerbation (Heart Failure)      Long-Range Goal: Symptom Exacerbation Prevented or Minimized   Start Date: 10/18/2020  Expected End Date: 02/15/2021  Priority: High  Note:   Current Barriers:  Chronic Disease Management support and educational needs r/t CHF.   Case Manager Clinical Goal(s):  Over the next 120 days, patient will not require hospitalization or emergent care d/t complications r/t CHF.   Interventions:  Collaboration with Jerrol Banana., MD regarding development and update of comprehensive plan of care as evidenced by provider attestation and co-signature Inter-disciplinary care team collaboration (see longitudinal plan of care) Discussed current plan for CHF management and reviewed medications. Reports excellent compliance. Reports adjusting diuretic as advised by her provider for increased swelling and weight gain.  Provided information regarding recommended weight parameters. Encouraged to weigh daily and record readings. Encouraged to notify provider for weight gain greater than 3 lbs overnight or weight gain greater than 5 lbs within a week. Reports weight today was 174 lbs. Discussed diet and nutritional intake. Reports monitoring her intake and attempting to follow a heart healthy diet. Advised to monitor sodium intake and avoid highly processed foods when possible. Discussed s/sx of fluid overload and indications for notifying a provider. Denies chest pain or palpitations. Denies episodes of shortness of breath. Denies increased abdominal edema. Reports her lower extremities(mainly ankles) are always swollen during the warmer months. Reports previously being advised to wear compression hose but she prefers not to wear them. Reports keeping lower extremities elevated. Reports she is  able to walk short distances but ambulation is limited d/t chronic back pain. Denies recent changes in activity tolerance but reports she is unable to tolerate standing for prolonged periods.  Reviewed worsening s/sx related to CHF exacerbation and indications for seeking immediate medical attention.   Self-Care/Patient Goals: Take all medications as prescribed Monitor weight and record readings Adhere to recommended cardiac prudent/heart healthy diet Notify provider for weight gain outside of established parameters Contact provider or care management team with questions and new concerns as needed    Follow Up Plan:  Will follow up in two months    Patient Care Plan: COPD (Adult)     Problem Identified: Symptom Exacerbation (COPD)      Long-Range Goal: Symptom Exacerbation Prevented or Minimized   Start Date: 10/18/2020  Expected End Date: 02/15/2021  Priority: High  Note:   Current Barriers:  Chronic Disease Management support and educational needs r/t COPD.  Case Manager Clinical Goal(s): Over the next 120 days, patient will not require hospitalization or emergent care due to complications related to COPD.  Interventions:  Collaboration with Jerrol Banana., MD regarding development and update of comprehensive plan of care as evidenced by provider attestation and co-signature Inter-disciplinary care team collaboration (see longitudinal plan of care) Reviewed medications.  Encouraged to continue taking medications and utilizing inhalers as prescribed. Encouraged to notify the care management team with concerns regarding prescription cost. Provided verbal COPD education on self care and exacerbation prevention. Reports symptoms are usually triggered by heat during warmer months. She avoids outdoor activity during the heat of the day to prevent complications. Discussed importance of adequate hydration and taking frequent breaks as needed. Encouraged to take needed  precautions and avoid extreme temperatures to prevent exacerbation. Discussed current action plan and reinforced  importance of daily self assessment. Advised to make an appointment with her provider if in the yellow zone for 48 hours without improvement. Reviewed worsening symptoms that require immediate medical attention.  Provided information regarding infection prevention and increased risk r/t COPD.  Advised to utilize  prevention strategies to reduce risk of respiratory infection.     Self-Care/Patient Goals: Self administer medications as prescribed Attend all scheduled provider appointments Assess symptoms daily and notify provider if not improving  Call pharmacy for medication refills Calls provider office for new concerns or questions   Follow Up Plan:  Will follow up in two months    Patient Care Plan: Diabetes Type 2 (Adult)     Problem Identified: Glycemic Management (Diabetes, Type 2)      Long-Range Goal: Glycemic Management Optimized   Start Date: 10/18/2020  Expected End Date: 02/15/2021  Priority: Medium  Note:   Objective:  Lab Results  Component Value Date   HGBA1C 7.2 (H) 10/03/2020   Lab Results  Component Value Date   CREATININE 1.10 (H) 08/24/2020   CREATININE 0.90 08/16/2020   CREATININE 1.00 06/08/2020   Lab Results  Component Value Date   EGFR 50 (L) 08/24/2020    Current Barriers:  Chronic Disease Management support and educational needs related to Diabetes self management.  Case Manager Clinical Goal(s):  Over the next 120 days, patient will demonstrate improved adherence to prescribed treatment plan for diabetes self care/management as evidenced by: monitoring and recording CBG's, adherence to ADA/ carb modified diet and adherence to prescribed medication regimen.  Interventions:  Collaboration with Jerrol Banana., MD regarding development and update of comprehensive plan of care as evidenced by provider attestation and  co-signature Inter-disciplinary care team collaboration (see longitudinal plan of care) Reviewed medications with patient and discussed importance of medication adherence. Discussed importance of consistent blood glucose monitoring. Advised to monitor and maintain a log. Reports having a glucometer but she prefers not to monitor fasting blood glucose levels. Provided information regarding s/sx of hypoglycemia and hyperglycemia along with appropriate interventions. Strongly advised to monitor a few times a week if unable to monitor daily and record readings.  Discussed nutritional intake and importance of adhering to a carb modified/diabetic diet. Reviewed recommended DM exams. Advised to perform foot care daily and notify provider with changes. Reports last eye exam was over two years ago. Denies changes in vision. Advised to contact the Ophthalmology clinic later this week to schedule an eye exam.  Discussed available DM resources and offered referral for DM education and nutrition. Declined need for additional resources.    Self-Care/Patient Goals Self administer medications as prescribed Attend  scheduled provider appointments Check blood sugars and record readings Adhere to prescribed ADA/carb modified Contact Ophthalmology clinic to schedule an eye exam   Follow Up Plan:  Will follow up in two months      Mrs. Demello verbalized understanding of the information discussed during the telephonic outreach. Declined need for mailed/printed instructions. A member of the care management team will follow up in two months.   [facility] Care Management [facility] [phone]

## 2020-10-26 ENCOUNTER — Ambulatory Visit: Payer: Medicare Other | Admitting: Family

## 2020-11-06 ENCOUNTER — Other Ambulatory Visit: Payer: Self-pay | Admitting: Cardiology

## 2020-11-07 DIAGNOSIS — M19012 Primary osteoarthritis, left shoulder: Secondary | ICD-10-CM | POA: Diagnosis not present

## 2020-11-07 DIAGNOSIS — M19011 Primary osteoarthritis, right shoulder: Secondary | ICD-10-CM | POA: Diagnosis not present

## 2020-11-21 ENCOUNTER — Other Ambulatory Visit: Payer: Self-pay | Admitting: Family Medicine

## 2020-11-21 NOTE — Telephone Encounter (Signed)
Valid encounter, no future visit at this time

## 2020-12-19 ENCOUNTER — Telehealth: Payer: Self-pay

## 2020-12-20 ENCOUNTER — Telehealth: Payer: Self-pay

## 2020-12-20 NOTE — Telephone Encounter (Signed)
LMOVM for pt to return call 

## 2020-12-20 NOTE — Telephone Encounter (Signed)
Copied from [place] [phone]. Topic: Appointment Scheduling - Scheduling Inquiry for Clinic >> Dec 20, 2020 11:39 AM Greggory Keen D wrote: Reason for CRM: Pt called saying she has a cold she can not get rid of.  She took a covid test last week and it was negative.  She ask to be seen but nothing is available.  Can some one please see her are get a nurse to call her back.    CB#  [phone]

## 2020-12-21 ENCOUNTER — Emergency Department: Payer: Medicare Other

## 2020-12-21 ENCOUNTER — Other Ambulatory Visit: Payer: Self-pay

## 2020-12-21 ENCOUNTER — Inpatient Hospital Stay
Admission: EM | Admit: 2020-12-21 | Discharge: 2020-12-24 | DRG: 291 | Disposition: A | Discharge status: Home / Self Care | Payer: Medicare Other | Attending: Family Medicine | Admitting: Family Medicine

## 2020-12-21 DIAGNOSIS — Z803 Family history of malignant neoplasm of breast: Secondary | ICD-10-CM

## 2020-12-21 DIAGNOSIS — J441 Chronic obstructive pulmonary disease with (acute) exacerbation: Secondary | ICD-10-CM | POA: Diagnosis not present

## 2020-12-21 DIAGNOSIS — I5043 Acute on chronic combined systolic (congestive) and diastolic (congestive) heart failure: Secondary | ICD-10-CM | POA: Diagnosis not present

## 2020-12-21 DIAGNOSIS — I110 Hypertensive heart disease with heart failure: Secondary | ICD-10-CM | POA: Diagnosis not present

## 2020-12-21 DIAGNOSIS — Z79899 Other long term (current) drug therapy: Secondary | ICD-10-CM

## 2020-12-21 DIAGNOSIS — R52 Pain, unspecified: Secondary | ICD-10-CM | POA: Diagnosis not present

## 2020-12-21 DIAGNOSIS — Z8249 Family history of ischemic heart disease and other diseases of the circulatory system: Secondary | ICD-10-CM

## 2020-12-21 DIAGNOSIS — J90 Pleural effusion, not elsewhere classified: Secondary | ICD-10-CM | POA: Diagnosis not present

## 2020-12-21 DIAGNOSIS — Z20822 Contact with and (suspected) exposure to covid-19: Secondary | ICD-10-CM | POA: Diagnosis not present

## 2020-12-21 DIAGNOSIS — E669 Obesity, unspecified: Secondary | ICD-10-CM | POA: Diagnosis not present

## 2020-12-21 DIAGNOSIS — Z79890 Hormone replacement therapy (postmenopausal): Secondary | ICD-10-CM

## 2020-12-21 DIAGNOSIS — E119 Type 2 diabetes mellitus without complications: Secondary | ICD-10-CM

## 2020-12-21 DIAGNOSIS — I2510 Atherosclerotic heart disease of native coronary artery without angina pectoris: Secondary | ICD-10-CM | POA: Diagnosis not present

## 2020-12-21 DIAGNOSIS — Z801 Family history of malignant neoplasm of trachea, bronchus and lung: Secondary | ICD-10-CM

## 2020-12-21 DIAGNOSIS — J189 Pneumonia, unspecified organism: Secondary | ICD-10-CM

## 2020-12-21 DIAGNOSIS — Z7982 Long term (current) use of aspirin: Secondary | ICD-10-CM

## 2020-12-21 DIAGNOSIS — Z6833 Body mass index (BMI) 33.0-33.9, adult: Secondary | ICD-10-CM | POA: Diagnosis not present

## 2020-12-21 DIAGNOSIS — I429 Cardiomyopathy, unspecified: Secondary | ICD-10-CM | POA: Diagnosis not present

## 2020-12-21 DIAGNOSIS — I10 Essential (primary) hypertension: Secondary | ICD-10-CM | POA: Diagnosis present

## 2020-12-21 DIAGNOSIS — E1151 Type 2 diabetes mellitus with diabetic peripheral angiopathy without gangrene: Secondary | ICD-10-CM | POA: Diagnosis present

## 2020-12-21 DIAGNOSIS — R0602 Shortness of breath: Secondary | ICD-10-CM

## 2020-12-21 DIAGNOSIS — D649 Anemia, unspecified: Secondary | ICD-10-CM | POA: Diagnosis present

## 2020-12-21 DIAGNOSIS — Z885 Allergy status to narcotic agent status: Secondary | ICD-10-CM

## 2020-12-21 DIAGNOSIS — I472 Ventricular tachycardia, unspecified: Secondary | ICD-10-CM | POA: Diagnosis not present

## 2020-12-21 DIAGNOSIS — I471 Supraventricular tachycardia, unspecified: Secondary | ICD-10-CM | POA: Diagnosis present

## 2020-12-21 DIAGNOSIS — Z85828 Personal history of other malignant neoplasm of skin: Secondary | ICD-10-CM | POA: Diagnosis not present

## 2020-12-21 DIAGNOSIS — I447 Left bundle-branch block, unspecified: Secondary | ICD-10-CM | POA: Diagnosis present

## 2020-12-21 DIAGNOSIS — Z8711 Personal history of peptic ulcer disease: Secondary | ICD-10-CM | POA: Diagnosis not present

## 2020-12-21 DIAGNOSIS — E039 Hypothyroidism, unspecified: Secondary | ICD-10-CM | POA: Diagnosis present

## 2020-12-21 DIAGNOSIS — R059 Cough, unspecified: Secondary | ICD-10-CM | POA: Diagnosis not present

## 2020-12-21 DIAGNOSIS — Z888 Allergy status to other drugs, medicaments and biological substances status: Secondary | ICD-10-CM

## 2020-12-21 DIAGNOSIS — Z7984 Long term (current) use of oral hypoglycemic drugs: Secondary | ICD-10-CM

## 2020-12-21 DIAGNOSIS — E785 Hyperlipidemia, unspecified: Secondary | ICD-10-CM | POA: Diagnosis not present

## 2020-12-21 LAB — RESPIRATORY PANEL BY PCR

## 2020-12-21 LAB — BASIC METABOLIC PANEL
Anion gap: 14 (ref 5–15)
BUN: 16 mg/dL (ref 8–23)
CO2: 24 mmol/L (ref 22–32)
Calcium: 9.5 mg/dL (ref 8.9–10.3)
Chloride: 105 mmol/L (ref 98–111)
Creatinine, Ser: 0.9 mg/dL (ref 0.44–1.00)
GFR, Estimated: >60 mL/min (ref >60)
Glucose, Bld: 174 mg/dL — ABNORMAL HIGH (ref 70–99)
Potassium: 4.2 mmol/L (ref 3.5–5.1)
Sodium: 143 mmol/L (ref 135–145)

## 2020-12-21 LAB — TROPONIN I (HIGH SENSITIVITY)
Troponin I (High Sensitivity): 29 ng/L — ABNORMAL HIGH (ref <18)
Troponin I (High Sensitivity): 45 ng/L — ABNORMAL HIGH (ref <18)

## 2020-12-21 LAB — CBC
HCT: 33.1 % — ABNORMAL LOW (ref 36.0–46.0)
Hemoglobin: 10.1 g/dL — ABNORMAL LOW (ref 12.0–15.0)
MCH: 26.4 pg (ref 26.0–34.0)
MCHC: 30.5 g/dL (ref 30.0–36.0)
MCV: 86.4 fL (ref 80.0–100.0)
Platelets: 366 10*3/uL (ref 150–400)
RBC: 3.83 MIL/uL — ABNORMAL LOW (ref 3.87–5.11)
RDW: 15.7 % — ABNORMAL HIGH (ref 11.5–15.5)
WBC: 12.2 10*3/uL — ABNORMAL HIGH (ref 4.0–10.5)
nRBC: 0.6 % — ABNORMAL HIGH (ref 0.0–0.2)

## 2020-12-21 LAB — BRAIN NATRIURETIC PEPTIDE: B Natriuretic Peptide: 846 pg/mL — ABNORMAL HIGH (ref 0.0–100.0)

## 2020-12-21 LAB — HEPATIC FUNCTION PANEL
ALT: 13 U/L (ref 0–44)
AST: 15 U/L (ref 15–41)
Albumin: 3 g/dL — ABNORMAL LOW (ref 3.5–5.0)
Alkaline Phosphatase: 84 U/L (ref 38–126)
Bilirubin, Direct: <0.1 mg/dL (ref 0.0–0.2)
Total Bilirubin: 0.6 mg/dL (ref 0.3–1.2)
Total Protein: 7.2 g/dL (ref 6.5–8.1)

## 2020-12-21 LAB — PROCALCITONIN: Procalcitonin: <0.1 ng/mL

## 2020-12-21 LAB — CBG MONITORING, ED
Glucose-Capillary: 284 mg/dL — ABNORMAL HIGH (ref 70–99)
Glucose-Capillary: 262 mg/dL — ABNORMAL HIGH (ref 70–99)

## 2020-12-21 LAB — RESP PANEL BY RT-PCR (FLU A&B, COVID) ARPGX2
Influenza A by PCR: NEGATIVE
Influenza B by PCR: NEGATIVE
SARS Coronavirus 2 by RT PCR: NEGATIVE

## 2020-12-21 MED ORDER — CARVEDILOL 6.25 MG PO TABS
6.2500 mg | ORAL_TABLET | Freq: Two times a day (BID) | ORAL | Status: DC
  Administered 2020-12-21 – 2020-12-24 (×6): 6.25 mg via ORAL
  Filled 2020-12-21 (×6): qty 1

## 2020-12-21 MED ORDER — IPRATROPIUM-ALBUTEROL 0.5-2.5 (3) MG/3ML IN SOLN
3.0000 mL | Freq: Three times a day (TID) | RESPIRATORY_TRACT | Status: DC
  Administered 2020-12-21 – 2020-12-23 (×7): 3 mL via RESPIRATORY_TRACT
  Filled 2020-12-21 (×7): qty 3

## 2020-12-21 MED ORDER — SODIUM CHLORIDE 0.9 % IV SOLN
500.0000 mg | INTRAVENOUS | Status: DC
  Administered 2020-12-22 – 2020-12-23 (×2): 500 mg via INTRAVENOUS
  Filled 2020-12-21 (×2): qty 500

## 2020-12-21 MED ORDER — ONDANSETRON HCL 4 MG PO TABS: 4.0000 mg | ORAL_TABLET | Freq: Four times a day (QID) | ORAL | Status: DC | PRN

## 2020-12-21 MED ORDER — AMIODARONE HCL 200 MG PO TABS
200.0000 mg | ORAL_TABLET | Freq: Every day | ORAL | Status: DC
  Administered 2020-12-21 – 2020-12-24 (×4): 200 mg via ORAL
  Filled 2020-12-21 (×4): qty 1

## 2020-12-21 MED ORDER — ACETAMINOPHEN 650 MG RE SUPP
650.0000 mg | Freq: Four times a day (QID) | RECTAL | Status: DC | PRN
  Filled 2020-12-21: qty 1

## 2020-12-21 MED ORDER — FUROSEMIDE 10 MG/ML IJ SOLN
40.0000 mg | Freq: Two times a day (BID) | INTRAMUSCULAR | Status: DC
  Administered 2020-12-21 – 2020-12-23 (×4): 40 mg via INTRAVENOUS
  Filled 2020-12-21 (×4): qty 4

## 2020-12-21 MED ORDER — SODIUM CHLORIDE 0.9 % IV SOLN
2.0000 g | INTRAVENOUS | Status: DC
  Administered 2020-12-22: 2 g via INTRAVENOUS
  Filled 2020-12-21: qty 20

## 2020-12-21 MED ORDER — ACETAMINOPHEN 325 MG PO TABS: 650.0000 mg | ORAL_TABLET | Freq: Four times a day (QID) | ORAL | Status: DC | PRN

## 2020-12-21 MED ORDER — SODIUM CHLORIDE 0.9 % IV SOLN
500.0000 mg | Freq: Once | INTRAVENOUS | Status: AC
  Administered 2020-12-21: 500 mg via INTRAVENOUS
  Filled 2020-12-21: qty 500

## 2020-12-21 MED ORDER — FUROSEMIDE 10 MG/ML IJ SOLN
40.0000 mg | Freq: Once | INTRAMUSCULAR | Status: AC
  Administered 2020-12-21: 40 mg via INTRAVENOUS
  Filled 2020-12-21: qty 4

## 2020-12-21 MED ORDER — ASPIRIN EC 81 MG PO TBEC
81.0000 mg | DELAYED_RELEASE_TABLET | Freq: Every day | ORAL | Status: DC
  Administered 2020-12-21 – 2020-12-24 (×4): 81 mg via ORAL
  Filled 2020-12-21 (×4): qty 1

## 2020-12-21 MED ORDER — LEVOTHYROXINE SODIUM 100 MCG PO TABS
100.0000 ug | ORAL_TABLET | Freq: Every day | ORAL | Status: DC
  Administered 2020-12-22 – 2020-12-24 (×3): 100 ug via ORAL
  Filled 2020-12-21: qty 1
  Filled 2020-12-21: qty 2
  Filled 2020-12-21: qty 1

## 2020-12-21 MED ORDER — LOSARTAN POTASSIUM 25 MG PO TABS
25.0000 mg | ORAL_TABLET | Freq: Every evening | ORAL | Status: DC
  Administered 2020-12-21 – 2020-12-23 (×3): 25 mg via ORAL
  Filled 2020-12-21 (×3): qty 1

## 2020-12-21 MED ORDER — ALBUTEROL SULFATE (2.5 MG/3ML) 0.083% IN NEBU: 2.5000 mg | INHALATION_SOLUTION | RESPIRATORY_TRACT | Status: DC | PRN

## 2020-12-21 MED ORDER — ONDANSETRON HCL 4 MG/2ML IJ SOLN: 4.0000 mg | Freq: Four times a day (QID) | INTRAMUSCULAR | Status: DC | PRN

## 2020-12-21 MED ORDER — INSULIN ASPART 100 UNIT/ML IJ SOLN
0.0000 [IU] | Freq: Every day | INTRAMUSCULAR | Status: DC
  Administered 2020-12-21: 3 [IU] via SUBCUTANEOUS
  Administered 2020-12-22 – 2020-12-23 (×2): 2 [IU] via SUBCUTANEOUS
  Filled 2020-12-21 (×3): qty 1

## 2020-12-21 MED ORDER — PREDNISONE 20 MG PO TABS
40.0000 mg | ORAL_TABLET | Freq: Every day | ORAL | Status: DC
Start: 2020-12-22 — End: 2020-12-24
  Administered 2020-12-22 – 2020-12-24 (×3): 40 mg via ORAL
  Filled 2020-12-21 (×3): qty 2

## 2020-12-21 MED ORDER — SODIUM CHLORIDE 0.9 % IV SOLN
1.0000 g | Freq: Once | INTRAVENOUS | Status: AC
  Administered 2020-12-21: 1 g via INTRAVENOUS
  Filled 2020-12-21: qty 10

## 2020-12-21 MED ORDER — ATORVASTATIN CALCIUM 20 MG PO TABS
40.0000 mg | ORAL_TABLET | Freq: Every day | ORAL | Status: DC
  Administered 2020-12-21 – 2020-12-24 (×4): 40 mg via ORAL
  Filled 2020-12-21 (×4): qty 2

## 2020-12-21 MED ORDER — INSULIN ASPART 100 UNIT/ML IJ SOLN
0.0000 [IU] | Freq: Three times a day (TID) | INTRAMUSCULAR | Status: DC
  Administered 2020-12-21: 8 [IU] via SUBCUTANEOUS
  Administered 2020-12-22: 3 [IU] via SUBCUTANEOUS
  Administered 2020-12-22: 5 [IU] via SUBCUTANEOUS
  Administered 2020-12-22: 8 [IU] via SUBCUTANEOUS
  Filled 2020-12-21 (×4): qty 1

## 2020-12-21 MED ORDER — IPRATROPIUM-ALBUTEROL 0.5-2.5 (3) MG/3ML IN SOLN
3.0000 mL | Freq: Once | RESPIRATORY_TRACT | Status: AC
  Administered 2020-12-21: 3 mL via RESPIRATORY_TRACT
  Filled 2020-12-21: qty 3

## 2020-12-21 MED ORDER — ENOXAPARIN SODIUM 40 MG/0.4ML IJ SOSY
40.0000 mg | PREFILLED_SYRINGE | INTRAMUSCULAR | Status: DC
  Administered 2020-12-21 – 2020-12-23 (×3): 40 mg via SUBCUTANEOUS
  Filled 2020-12-21 (×3): qty 0.4

## 2020-12-21 MED ORDER — METHYLPREDNISOLONE SODIUM SUCC 125 MG IJ SOLR
125.0000 mg | Freq: Once | INTRAMUSCULAR | Status: AC
  Administered 2020-12-21: 125 mg via INTRAVENOUS
  Filled 2020-12-21: qty 2

## 2020-12-21 NOTE — Assessment & Plan Note (Signed)
-   Check A1c -Hold Farxiga and metformin -Continue SS correction insulin

## 2020-12-21 NOTE — Assessment & Plan Note (Signed)
HR normal.  Not clear if this is Afib, not on AC. -Continue amiodarone

## 2020-12-21 NOTE — Assessment & Plan Note (Signed)
Hgb stable relative to abseline 

## 2020-12-21 NOTE — ED Provider Notes (Signed)
Emergency Medicine Provider Triage Evaluation Note  Kathryn [name] , a 87 y.o. female  was evaluated in triage.  Pt complains of shortness of breath for several days.  History of CHF.  Also has productive cough.  Noticed her ankles are swelling.  Patient does not use oxygen at home and we had to place her on 2 L nasal cannula.  Review of Systems  Positive: Cough, shortness of breath Negative: Vomiting, diarrhea, abdominal pain, chest pain  Physical Exam  BP (!) 111/58   Pulse 88   Temp 99.5 F (37.5 C) (Oral)   Resp (!) 21   Ht 5' (1.524 m)   Wt 78.9 kg   SpO2 97%   BMI 33.98 kg/m  Gen:   Awake, no distress   Resp:  Normal effort  MSK:   Moves extremities without difficulty small amount of swelling noted Other:    Medical Decision Making  Medically screening exam initiated at 8:29 AM.  Appropriate orders placed.  Twylla L Heinlein was informed that the remainder of the evaluation will be completed by another provider, this initial triage assessment does not replace that evaluation, and the importance of remaining in the ED until their evaluation is complete.     Versie Starks, PA-C 12/21/20 1610    Blake Divine, MD 12/21/20 [phone]

## 2020-12-21 NOTE — ED Notes (Signed)
Meal tray given to pt.

## 2020-12-21 NOTE — ED Notes (Signed)
Patient given lunch tray at this time.

## 2020-12-21 NOTE — Assessment & Plan Note (Signed)
P/w wheezing, SOB and URI symptoms -Continue steroids -Continue bronchodilators -Continue antibiotics -Continue home spiriva

## 2020-12-21 NOTE — ED Triage Notes (Signed)
Pt comes into the ED via [facility] from home c/o cough x 3 weeks.  Clear lung sounds by auscultation with EMS, but has shallow breathing.  Pt has h/o COPD.  Pt did take Spiriva this morning.   135/65 97% on 4L (no baseline) 27 ETCO2 95 HR

## 2020-12-21 NOTE — ED Notes (Signed)
Patient wet and bed. Patient cleaned, given gown, and new bed sheets placed at this time. Purewick reapplied. No other needs expressed at this time.

## 2020-12-21 NOTE — Assessment & Plan Note (Signed)
BP controlled -Continue ARB and BB

## 2020-12-21 NOTE — ED Notes (Signed)
Pure wick placed.

## 2020-12-21 NOTE — ED Notes (Signed)
Brought to room.  Up to toilet to void.  Onto stretcher and she appears short of breath with thte exertion.  Resp were in the 30s.  Pulse ox maintained 97 on 2 liters oxygen.  Her breathing rate came down after resting awhile.

## 2020-12-21 NOTE — ED Notes (Signed)
SOB noted when taking off pants to place purewick

## 2020-12-21 NOTE — Assessment & Plan Note (Signed)
Rcent echo last Jan showed E 40-45% -Furosemide 40 mg IV twice a day  -K supplement -Strict I/Os, daily weights, telemetry  -Daily monitoring renal function -Continue ARB, BB

## 2020-12-21 NOTE — ED Provider Notes (Signed)
[facility] Emergency Department Provider Note   ____________________________________________   Event Date/Time   First MD Initiated Contact with Patient 12/21/20 [id number]     (approximate)  I have reviewed the triage vital signs and the nursing notes.   HISTORY  Chief Complaint Shortness of Breath    HPI Kathryn [name] is a 87 y.o. female with past medical history of hypertension, hyperlipidemia, diabetes, COPD, CHF, and PSVT who presents to the ED complaining of shortness of breath.  Patient reports that she has been dealing with a dry cough for about the past 2 weeks.  Cough has been gradually worsening over that time and for the past 24 to 48 hours she has been having a hard time catching her breath.  Symptoms seem to especially bad last night and she was unable to get an appointment with her PCP, so decided to come to the ED for evaluation.  She denies any fevers, chest pain, pain or swelling in her legs.  She has not had any sick contacts and denies any vomiting or diarrhea.        Past Medical History:  Diagnosis Date   Actinic keratosis    Aortic atherosclerosis ([place]) 05/25/2020   Arthritis    spinal stenosis   Cardiomyopathy - presumed to be nonischemic    a. 03/2020 Echo: EF 40-45%, gr2 DD. Nl RV fxn. Mildly dil LA. Mild MR/ao sclerosis; b. 03/2020 MV: EF 30-44%, no ischemia. Cor Ca2+ in pLAD.   CHF (congestive heart failure) (HCC)    COPD (chronic obstructive pulmonary disease) (HCC)    Coronary artery calcification seen on CT scan    a. 03/2020 MV: CT attenuation images show Ao and mod pLAD Ca2+.   Diabetes mellitus without complication ([place])    Gastric ulcer 4/12   treated with Prolisec- states no problems now   Hyperlipidemia    Hypertension    PCP Dr Miguel Aschoff   [place]   Hypothyroidism    Left bundle branch block (LBBB)    Neuromuscular disorder (HCC)    slight numbness right toes- comes and goes   Peripheral vascular disease  (HCC)    varicose veins left leg   Pneumonia    PSVT (paroxysmal supraventricular tachycardia) ([place])    a. 03/2020 Zio: Avg HR 87 (66-211).  4 beats NSVT.  43 PSVT episodes, longest 10h 1m @ avg of 153 bpm (max 211).  Seen by EP-->amio started (pt wished to avoid EPS/RFCA).   Seasonal allergies    Shortness of breath    Skin cancer    multiple from face   Squamous cell carcinoma of skin 03/23/2013   L dorsal hand   Squamous cell carcinoma of skin 02/04/2014   R forearm/in situ, L pretibial   Squamous cell carcinoma of skin 09/24/2018   R thumb    Patient Active Problem List   Diagnosis Date Noted   On amiodarone therapy 08/24/2020   Acute on chronic combined systolic and diastolic CHF (congestive heart failure) ([place]) 06/06/2020   PSVT (paroxysmal supraventricular tachycardia) (HCC)    AKI (acute kidney injury) ([place]) 05/26/2020   Aortic atherosclerosis ([place]) 05/25/2020   Acute on chronic respiratory failure with hypoxia ([place]) 05/24/2020   COPD exacerbation ([place]) 05/24/2020   Diabetes mellitus without complication ([place]) 09/32/3557   Acute CHF ([place]) 03/27/2020   Acute CHF (congestive heart failure) ([place]) 03/27/2020   Acute respiratory failure with hypoxia ([place])    Diabetes mellitus type 2, uncomplicated ([place]) 32/20/2542  COPD, mild ([place]) 09/17/2014   Clinical depression 09/17/2014   Elevated platelet count 09/17/2014   Elevated erythrocyte sedimentation rate 09/17/2014   History of tobacco use 09/17/2014   Hypercholesteremia 09/17/2014   BP (high blood pressure) 09/17/2014   Hypothyroidism 09/17/2014   Cannot sleep 09/17/2014   Lumbar disc disease with radiculopathy 09/17/2014   Arthritis, degenerative 09/17/2014   Excess weight 09/17/2014   Vertebrobasilar circulation transient ischemic attack 09/17/2014   Lung nodule, multiple 09/17/2014   Basilar artery insufficiency 09/17/2014   Neuritis or radiculitis due to rupture of lumbar intervertebral disc 08/25/2013    Degenerative arthritis of lumbar spine 08/25/2013   Lumbar canal stenosis 08/25/2013   Smoker 07/15/2012   Back pain 07/15/2012   SOB (shortness of breath) 07/15/2012   Hyperlipidemia 07/15/2012   Essential hypertension 07/15/2012    Past Surgical History:  Procedure Laterality Date   BACK SURGERY     4/12  Dr Shellia Carwin- for lumbar stenosis   BREAST CYST ASPIRATION Left 1965   negative   [place]   lumpectomy with biopsy   left   CARPAL TUNNEL RELEASE     right   COLONOSCOPY     COLONOSCOPY WITH PROPOFOL N/A 06/30/2015   Procedure: COLONOSCOPY WITH PROPOFOL;  Surgeon: Hulen Luster, MD;  Location: [facility] ENDOSCOPY;  Service: Gastroenterology;  Laterality: N/A;   COLONOSCOPY WITH PROPOFOL N/A 01/22/2017   Procedure: COLONOSCOPY WITH PROPOFOL;  Surgeon: Lollie Sails, MD;  Location: [facility] ENDOSCOPY;  Service: Endoscopy;  Laterality: N/A;   COLONOSCOPY WITH PROPOFOL N/A 01/28/2019   Procedure: COLONOSCOPY WITH PROPOFOL;  Surgeon: Toledo, Benay Pike, MD;  Location: [facility] ENDOSCOPY;  Service: Gastroenterology;  Laterality: N/A;   ESOPHAGOGASTRODUODENOSCOPY     ESOPHAGOGASTRODUODENOSCOPY (EGD) WITH PROPOFOL N/A 01/28/2019   Procedure: ESOPHAGOGASTRODUODENOSCOPY (EGD) WITH PROPOFOL;  Surgeon: Toledo, Benay Pike, MD;  Location: [facility] ENDOSCOPY;  Service: Gastroenterology;  Laterality: N/A;   EYE SURGERY     cataract extraction with IOL bilaterally   JOINT REPLACEMENT     LUMBAR LAMINECTOMY/DECOMPRESSION MICRODISCECTOMY  05/24/2011   Procedure: LUMBAR LAMINECTOMY/DECOMPRESSION MICRODISCECTOMY 2 LEVELS;  Surgeon: Magnus Sinning, MD;  Location: [facility] ORS;  Service: Orthopedics;  Laterality: N/A;  L2-L3, L3-L4 (x-ray)   RIGHT OOPHORECTOMY     due to STAPH INFECTION    Prior to Admission medications   Medication Sig Start Date End Date Taking? Authorizing Provider  acetaminophen (TYLENOL) 500 MG tablet Take 1,000 mg by mouth every 6 (six) hours as needed. Pain    Yes [provider]  albuterol (PROVENTIL) (2.5 MG/3ML) 0.083% nebulizer solution Take 3 mLs (2.5 mg total) by nebulization every 6 (six) hours as needed for wheezing or shortness of breath. 04/05/20  Yes Jerrol Banana., MD  amiodarone (PACERONE) 200 MG tablet TAKE 1 TABLET(200 MG) BY MOUTH DAILY AS DIRECTED 11/07/20  Yes Vickie Epley, MD  aspirin EC 81 MG tablet Take 1 tablet (81 mg total) by mouth daily. Swallow whole. 04/18/20  Yes Visser, Jacquelyn D, PA-C  atorvastatin (LIPITOR) 40 MG tablet Take 1 tablet (40 mg total) by mouth daily. 04/18/20  Yes Minna Merritts, MD  carvedilol (COREG) 6.25 MG tablet Take 1 tablet (6.25 mg total) by mouth 2 (two) times daily with a meal. 04/27/20 04/22/21 Yes Visser, Jacquelyn D, PA-C  dapagliflozin propanediol (FARXIGA) 10 MG TABS tablet Take 1 tablet (10 mg total) by mouth daily before breakfast. 08/16/20  Yes Darylene Price A, FNP  hydrOXYzine (ATARAX/VISTARIL) 10 MG  tablet TAKE 1 TO 2 TABLETS(10 TO 20 MG) BY MOUTH EVERY 6 HOURS AS NEEDED 09/12/20  Yes Jerrol Banana., MD  levothyroxine (SYNTHROID) 100 MCG tablet TAKE 1 TABLET(100 MCG) BY MOUTH DAILY BEFORE BREAKFAST 08/17/20  Yes Jerrol Banana., MD  losartan (COZAAR) 25 MG tablet Take 1 tablet (25 mg total) by mouth every evening. 07/04/20  Yes Darylene Price A, FNP  metFORMIN (GLUCOPHAGE) 1000 MG tablet TAKE 1 TABLET(1000 MG) BY MOUTH DAILY WITH SUPPER 09/12/20  Yes Jerrol Banana., MD  omeprazole (PRILOSEC) 20 MG capsule TAKE 1 CAPSULE(20 MG) BY MOUTH DAILY 11/21/20  Yes Jerrol Banana., MD  tiotropium [facility]) 18 MCG inhalation capsule Place 1 capsule (18 mcg total) into inhaler and inhale daily as needed. 04/05/20  Yes Jerrol Banana., MD  torsemide (DEMADEX) 20 MG tablet Take 20 mg po daily in AM. Take one extra dose 20mg  PRN 3lb weight gain or abdominal fullness. 07/18/20  Yes Minna Merritts, MD  glucose blood (CONTOUR NEXT TEST) test strip Check sugar once daily  DX  E11.9 11/16/16   Jerrol Banana., MD    Allergies Oxycodone and Prednisone  Family History  Problem Relation Age of Onset   Dementia Mother    Lung cancer Father    Heart disease Father    Hypertension Sister    Breast cancer Maternal Aunt    Arthritis Son     Social History Social History   Tobacco Use   Smoking status: Former    Packs/day: 0.50    Years: 50.00    Pack years: 25.00    Types: Cigarettes    Quit date: 05/16/2004    Years since quitting: 16.6   Smokeless tobacco: Never  Vaping Use   Vaping Use: Never used  Substance Use Topics   Alcohol use: Yes    Comment: socially-1 glass of wine or [place] icea tea once a month   Drug use: No    Review of Systems  Constitutional: No fever/chills Eyes: No visual changes. ENT: No sore throat. Cardiovascular: Denies chest pain. Respiratory: Positive for cough and shortness of breath. Gastrointestinal: No abdominal pain.  No nausea, no vomiting.  No diarrhea.  No constipation. Genitourinary: Negative for dysuria. Musculoskeletal: Negative for back pain. Skin: Negative for rash. Neurological: Negative for headaches, focal weakness or numbness.  ____________________________________________   PHYSICAL EXAM:  VITAL SIGNS: ED Triage Vitals  Enc Vitals Group     BP 12/21/20 0807 (!) 145/123     Pulse Rate 12/21/20 0807 93     Resp 12/21/20 0807 (!) 21     Temp 12/21/20 0807 99.5 F (37.5 C)     Temp Source 12/21/20 0807 Oral     SpO2 12/21/20 0807 98 %     Weight 12/21/20 0820 174 lb (78.9 kg)     Height 12/21/20 0820 5' (1.524 m)     Head Circumference --      Peak Flow --      Pain Score 12/21/20 0819 0     Pain Loc --      Pain Edu? --      Excl. in [place]? --     Constitutional: Alert and oriented. Eyes: Conjunctivae are normal. Head: Atraumatic. Nose: No congestion/rhinnorhea. Mouth/Throat: Mucous membranes are moist. Neck: Normal ROM Cardiovascular: Normal rate, regular rhythm. Grossly  normal heart sounds.  2+ radial pulses bilaterally. Respiratory: Normal respiratory effort.  No retractions. Lungs with mild  end expiratory wheezing. Gastrointestinal: Soft and nontender. No distention. Genitourinary: deferred Musculoskeletal: No lower extremity tenderness nor edema. Neurologic:  Normal speech and language. No gross focal neurologic deficits are appreciated. Skin:  Skin is warm, dry and intact. No rash noted. Psychiatric: Mood and affect are normal. Speech and behavior are normal.  ____________________________________________   LABS (all labs ordered are listed, but only abnormal results are displayed)  Labs Reviewed  CBC - Abnormal; Notable for the following components:      Result Value   WBC 12.2 (*)    RBC 3.83 (*)    Hemoglobin 10.1 (*)    HCT 33.1 (*)    RDW 15.7 (*)    nRBC 0.6 (*)    All other components within normal limits  BASIC METABOLIC PANEL - Abnormal; Notable for the following components:   Glucose, Bld 174 (*)    All other components within normal limits  HEPATIC FUNCTION PANEL - Abnormal; Notable for the following components:   Albumin 3.0 (*)    All other components within normal limits  BRAIN NATRIURETIC PEPTIDE - Abnormal; Notable for the following components:   B Natriuretic Peptide 846.0 (*)    All other components within normal limits  TROPONIN I (HIGH SENSITIVITY) - Abnormal; Notable for the following components:   Troponin I (High Sensitivity) 29 (*)    All other components within normal limits  TROPONIN I (HIGH SENSITIVITY) - Abnormal; Notable for the following components:   Troponin I (High Sensitivity) 45 (*)    All other components within normal limits  RESP PANEL BY RT-PCR (FLU A&B, COVID) [id number]   ____________________________________________  EKG  ED ECG REPORT I, Blake Divine, the attending physician, personally viewed and interpreted this ECG.   Date: 12/21/2020  EKG Time: 8:08  Rate: 94  Rhythm: normal sinus  rhythm  Axis: Normal  Intervals:left bundle branch block  ST&T Change: None, negative sgarbossa   PROCEDURES  Procedure(s) performed (including Critical Care):  Procedures   ____________________________________________   INITIAL IMPRESSION / ASSESSMENT AND PLAN / ED COURSE      87 year old female with past medical history of hypertension, hyperlipidemia, diabetes, COPD, CHF, and PSVT who presents to the ED complaining of cough for the past 2 weeks with increasing shortness of breath over the past 2 days.  Patient is not in any respiratory distress, had been placed on 4 L nasal cannula by EMS, subsequently weaned to 2 L.  We will now trial patient off oxygen, treat her mild wheezing with DuoNeb and Solu-Medrol given her allergy to prednisone.  She has previously tolerated Solu-Medrol without difficulty.  Labs are unremarkable, troponin mildly elevated but I doubt ACS or PE.  Chest x-ray reviewed by me and shows multifocal opacities concerning for pneumonia, edema seems less likely as patient does not appear fluid overloaded.  Wheezing slightly improved following DuoNeb and steroids, patient maintaining O2 sats at 92 to 93% on room air but gets dyspneic with any movement.  Given multifocal pneumonia and advanced age, case discussed with hospitalist for admission.      ____________________________________________   FINAL CLINICAL IMPRESSION(S) / ED DIAGNOSES  Final diagnoses:  Multifocal pneumonia  Shortness of breath  COPD exacerbation [facility])     ED Discharge Orders     None        Note:  This document was prepared using Dragon voice recognition software and may include unintentional dictation errors.    Blake Divine, MD 12/21/20 1145

## 2020-12-21 NOTE — Assessment & Plan Note (Signed)
Continue levothyroxine 

## 2020-12-21 NOTE — ED Triage Notes (Signed)
See first nurse note, pt reports shob for several days that worsened last night. Pt speaking in complete sentences. Productive cough noted.  No O2 at home. On 4 L [place] in triage, titrated to 2 L [place]. 96% on 2 L [place].  Denies cp

## 2020-12-21 NOTE — H&P (Signed)
History and Physical    Kathryn [name] GUR:[id number] DOB: 03-10-1934 DOA: 12/21/2020  PCP: Jerrol Banana., MD   Chief Complaint: Dyspnea on exertion  HPI: Kathryn [name] is a 87 y.o. female with medical history significant of dCHF, COPD not on home O2, DM, hypothyroidism, and HTN who presented with 3 weeks of URI symptoms, now worsening.  Patient was in her usual state of health until few weeks ago she had sore throat URI symptoms and cough (congestion, rhinorrhea).  This progressed to worsening cough, fatigue, shortness of breath with exertion, and significantly worsened in the day prior to admission so she came to the ER.  In the ER, chest x-ray showed multifocal infiltrates, small pleural effusions.  White count was.  She was tachypneic and tachycardic and had a low-grade fever so she was started on antibiotics, steroids and the hospitalist service were asked to evaluate.   Review of Systems: Review of Systems  Constitutional: Positive for malaise/fatigue. Negative for fever.  HENT: Positive for congestion and sore throat.   Respiratory: Positive for cough and shortness of breath. Negative for sputum production and wheezing.   Cardiovascular: Negative for chest pain, orthopnea, leg swelling and PND.  Gastrointestinal: Negative.   Genitourinary: Negative.   Musculoskeletal: Negative.   Neurological: Positive for weakness. Negative for dizziness, focal weakness and loss of consciousness.  Psychiatric/Behavioral: Negative.   All other systems reviewed and are negative.    As per HPI otherwise 10 point review of systems negative.   Allergies  Allergen Reactions  . Oxycodone Other (See Comments)    Mental status change  . Prednisone     swelling, bruising.    Past Medical History:  Diagnosis Date  . Actinic keratosis   . Aortic atherosclerosis ([place]) 05/25/2020  . Arthritis    spinal stenosis  . Cardiomyopathy - presumed to be nonischemic    a. 03/2020 Echo: EF  40-45%, gr2 DD. Nl RV fxn. Mildly dil LA. Mild MR/ao sclerosis; b. 03/2020 MV: EF 30-44%, no ischemia. Cor Ca2+ in pLAD.  Marland Kitchen CHF (congestive heart failure) ([place])   . COPD (chronic obstructive pulmonary disease) ([place])   . Coronary artery calcification seen on CT scan    a. 03/2020 MV: CT attenuation images show Ao and mod pLAD Ca2+.  . Diabetes mellitus without complication ([place])   . Gastric ulcer 4/12   treated with Prolisec- states no problems now  . Hyperlipidemia   . Hypertension    PCP Dr Miguel Aschoff   [place]  . Hypothyroidism   . Left bundle branch block (LBBB)   . Neuromuscular disorder ([place])    slight numbness right toes- comes and goes  . Peripheral vascular disease (HCC)    varicose veins left leg  . Pneumonia   . PSVT (paroxysmal supraventricular tachycardia) ([place])    a. 03/2020 Zio: Avg HR 87 (66-211).  4 beats NSVT.  43 PSVT episodes, longest 10h 107m @ avg of 153 bpm (max 211).  Seen by EP-->amio started (pt wished to avoid EPS/RFCA).  . Seasonal allergies   . Shortness of breath   . Skin cancer    multiple from face  . Squamous cell carcinoma of skin 03/23/2013   L dorsal hand  . Squamous cell carcinoma of skin 02/04/2014   R forearm/in situ, L pretibial  . Squamous cell carcinoma of skin 09/24/2018   R thumb    Past Surgical History:  Procedure Laterality Date  . BACK SURGERY  4/12  Dr Shellia Carwin- for lumbar stenosis  . BREAST CYST ASPIRATION Left 1965   negative  . BREAST SURGERY  1962   lumpectomy with biopsy   left  . CARPAL TUNNEL RELEASE     right  . COLONOSCOPY    . COLONOSCOPY WITH PROPOFOL N/A 06/30/2015   Procedure: COLONOSCOPY WITH PROPOFOL;  Surgeon: Hulen Luster, MD;  Location: [facility] ENDOSCOPY;  Service: Gastroenterology;  Laterality: N/A;  . COLONOSCOPY WITH PROPOFOL N/A 01/22/2017   Procedure: COLONOSCOPY WITH PROPOFOL;  Surgeon: Lollie Sails, MD;  Location: [facility] ENDOSCOPY;  Service: Endoscopy;  Laterality: N/A;  . COLONOSCOPY WITH  PROPOFOL N/A 01/28/2019   Procedure: COLONOSCOPY WITH PROPOFOL;  Surgeon: Toledo, Benay Pike, MD;  Location: [facility] ENDOSCOPY;  Service: Gastroenterology;  Laterality: N/A;  . ESOPHAGOGASTRODUODENOSCOPY    . ESOPHAGOGASTRODUODENOSCOPY (EGD) WITH PROPOFOL N/A 01/28/2019   Procedure: ESOPHAGOGASTRODUODENOSCOPY (EGD) WITH PROPOFOL;  Surgeon: Toledo, Benay Pike, MD;  Location: [facility] ENDOSCOPY;  Service: Gastroenterology;  Laterality: N/A;  . EYE SURGERY     cataract extraction with IOL bilaterally  . JOINT REPLACEMENT    . LUMBAR LAMINECTOMY/DECOMPRESSION MICRODISCECTOMY  05/24/2011   Procedure: LUMBAR LAMINECTOMY/DECOMPRESSION MICRODISCECTOMY 2 LEVELS;  Surgeon: Magnus Sinning, MD;  Location: [facility] ORS;  Service: Orthopedics;  Laterality: N/A;  L2-L3, L3-L4 (x-ray)  . RIGHT OOPHORECTOMY     due to STAPH INFECTION     reports that she quit smoking about 16 years ago. Her smoking use included cigarettes. She has a 25.00 pack-year smoking history. She has never used smokeless tobacco. She reports current alcohol use. She reports that she does not use drugs.  Family History  Problem Relation Age of Onset  . Dementia Mother   . Lung cancer Father   . Heart disease Father   . Hypertension Sister   . Breast cancer Maternal Aunt   . Arthritis Son     Prior to Admission medications   Medication Sig Start Date End Date Taking? Authorizing Provider  acetaminophen (TYLENOL) 500 MG tablet Take 1,000 mg by mouth every 6 (six) hours as needed. Pain    Yes [provider]  albuterol (PROVENTIL) (2.5 MG/3ML) 0.083% nebulizer solution Take 3 mLs (2.5 mg total) by nebulization every 6 (six) hours as needed for wheezing or shortness of breath. 04/05/20  Yes Jerrol Banana., MD  amiodarone (PACERONE) 200 MG tablet TAKE 1 TABLET(200 MG) BY MOUTH DAILY AS DIRECTED 11/07/20  Yes Vickie Epley, MD  aspirin EC 81 MG tablet Take 1 tablet (81 mg total) by mouth daily. Swallow whole. 04/18/20  Yes Visser,  Jacquelyn D, PA-C  atorvastatin (LIPITOR) 40 MG tablet Take 1 tablet (40 mg total) by mouth daily. 04/18/20  Yes Minna Merritts, MD  carvedilol (COREG) 6.25 MG tablet Take 1 tablet (6.25 mg total) by mouth 2 (two) times daily with a meal. 04/27/20 04/22/21 Yes Visser, Jacquelyn D, PA-C  dapagliflozin propanediol (FARXIGA) 10 MG TABS tablet Take 1 tablet (10 mg total) by mouth daily before breakfast. 08/16/20  Yes Darylene Price A, FNP  hydrOXYzine (ATARAX/VISTARIL) 10 MG tablet TAKE 1 TO 2 TABLETS(10 TO 20 MG) BY MOUTH EVERY 6 HOURS AS NEEDED 09/12/20  Yes Jerrol Banana., MD  levothyroxine (SYNTHROID) 100 MCG tablet TAKE 1 TABLET(100 MCG) BY MOUTH DAILY BEFORE BREAKFAST 08/17/20  Yes Jerrol Banana., MD  losartan (COZAAR) 25 MG tablet Take 1 tablet (25 mg total) by mouth every evening. 07/04/20  Yes Alisa Graff, FNP  metFORMIN (GLUCOPHAGE) 1000 MG tablet TAKE 1 TABLET(1000 MG) BY MOUTH DAILY WITH SUPPER 09/12/20  Yes Jerrol Banana., MD  omeprazole (PRILOSEC) 20 MG capsule TAKE 1 CAPSULE(20 MG) BY MOUTH DAILY 11/21/20  Yes Jerrol Banana., MD  tiotropium [facility]) 18 MCG inhalation capsule Place 1 capsule (18 mcg total) into inhaler and inhale daily as needed. 04/05/20  Yes Jerrol Banana., MD  torsemide (DEMADEX) 20 MG tablet Take 20 mg po daily in AM. Take one extra dose 20mg  PRN 3lb weight gain or abdominal fullness. 07/18/20  Yes Minna Merritts, MD  glucose blood (CONTOUR NEXT TEST) test strip Check sugar once daily  DX E11.9 11/16/16   Jerrol Banana., MD    Physical Exam: Vitals:   12/21/20 1000 12/21/20 1111 12/21/20 1400 12/21/20 1430  BP: (!) 155/105 (!) 146/62 137/75 (!) 146/71  Pulse: 89 94 89 96  Resp: 19 (!) 25 (!) 25 (!) 22  Temp:      TempSrc:      SpO2: 100% 94% 92% 93%  Weight:      Height:       Physical Exam Vitals and nursing note reviewed.  Constitutional:      General: She is not in acute distress.    Appearance: She is obese.   HENT:     Mouth/Throat:     Mouth: Mucous membranes are moist.     Pharynx: Oropharynx is clear. No pharyngeal swelling.     Comments: OP dry Neck:     Thyroid: No thyromegaly.     Trachea: No tracheal deviation.  Cardiovascular:     Rate and Rhythm: Normal rate and regular rhythm.     Heart sounds: No murmur heard.   No gallop.  Pulmonary:     Effort: Pulmonary effort is normal. No tachypnea.     Breath sounds: Rhonchi present. No wheezing or rales.  Abdominal:     Palpations: Abdomen is soft.     Tenderness: There is no abdominal tenderness. There is no guarding or rebound.  Musculoskeletal:        General: Normal range of motion.     Cervical back: Normal range of motion and neck supple.     Right lower leg: No edema.     Left lower leg: No edema.  Lymphadenopathy:     Cervical: No cervical adenopathy.  Skin:    General: Skin is warm and dry.     Capillary Refill: Capillary refill takes less than 2 seconds.  Neurological:     General: No focal deficit present.     Mental Status: She is alert and oriented to person, place, and time.     Cranial Nerves: No cranial nerve deficit.     Motor: No weakness.  Psychiatric:        Mood and Affect: Mood normal. Mood is not anxious.        Behavior: Behavior normal.         Labs on Admission: I have personally reviewed the patients's labs and imaging studies.  Assessment/Plan Principal Problem:   Acute on chronic combined systolic and diastolic CHF (congestive heart failure) (HCC) Active Problems:   COPD exacerbation (HCC)   Multifocal pneumonia   Hypothyroidism   Diabetes mellitus type 2, uncomplicated (HCC)   Essential hypertension   PSVT (paroxysmal supraventricular tachycardia) (HCC)   Normocytic anemia    Congestive heart failure - IV furosemide - Continue ARB, beta-blocker - Daily BMP, strict ins  and outs  COPD exacerbation - Continue steroids, bronchodilators, antibiotics, home Spiriva  Possible  multifocal pneumonia - Continue antibiotics for now, monitor procalcitonin - Continue chest x-ray every 4 weeks  Hypothyroidism - Continue levothyroxine  Diabetes - Check A1c - Hold Farxiga and metformin - Continue sliding scale corrections  Hypertension Blood pressure controlled - Continue ARB and beta-blocker  Paroxysmal SVT - Not clear if this is A. fib, not on anticoagulation - Continue amiodarone   Admission status: Inpatient Med-Surg  Certification: The appropriate patient status for this patient is INPATIENT. Inpatient status is judged to be reasonable and necessary in order to provide the required intensity of service to ensure the patient's safety. The patient's presenting symptoms, physical exam findings, and initial radiographic and laboratory data in the context of their chronic comorbidities is felt to place them at high risk for further clinical deterioration. Furthermore, it is not anticipated that the patient will be medically stable for discharge from the hospital within 2 midnights of admission. The following factors support the patient status of inpatient.   " The patient's presenting symptoms include dyspnea on exertion, respiratory distress. " The worrisome physical exam findings include tachypnea with exertion. " The initial radiographic and laboratory data are worrisome because of pulmonary edema on chest x-ray, multifocal infiltrates, pleural effusions, leukocytosis. " The chronic co-morbidities include COPD, CHF, diabetes, advanced age.   * I certify that at the point of admission it is my clinical judgment that the patient will require inpatient hospital care spanning beyond 2 midnights from the point of admission due to high intensity of service, high risk for further deterioration and high frequency of surveillance required.Edwin Dada MD [facility] If 7PM-7AM, please contact night-coverage [email or web address]  12/21/2020, 4:47 PM

## 2020-12-21 NOTE — ED Notes (Signed)
Dietary called to ask abut food tray, they stated they were on the way

## 2020-12-21 NOTE — Assessment & Plan Note (Signed)
Started with URI and cough, now progressed 1-2 weeks later to multifocal appearing pneumonia on CXR -Continue antibiotics for now - Check procal - Repeat CXR in 4 weeks

## 2020-12-22 LAB — CBC
HCT: 34.1 % — ABNORMAL LOW (ref 36.0–46.0)
Hemoglobin: 10.4 g/dL — ABNORMAL LOW (ref 12.0–15.0)
MCH: 26.2 pg (ref 26.0–34.0)
MCHC: 30.5 g/dL (ref 30.0–36.0)
MCV: 85.9 fL (ref 80.0–100.0)
Platelets: 382 10*3/uL (ref 150–400)
RBC: 3.97 MIL/uL (ref 3.87–5.11)
RDW: 15.9 % — ABNORMAL HIGH (ref 11.5–15.5)
WBC: 16.9 10*3/uL — ABNORMAL HIGH (ref 4.0–10.5)
nRBC: 0.4 % — ABNORMAL HIGH (ref 0.0–0.2)

## 2020-12-22 LAB — PROCALCITONIN: Procalcitonin: 0.1 ng/mL

## 2020-12-22 LAB — BASIC METABOLIC PANEL
Anion gap: 13 (ref 5–15)
BUN: 22 mg/dL (ref 8–23)
CO2: 27 mmol/L (ref 22–32)
Calcium: 9.5 mg/dL (ref 8.9–10.3)
Chloride: 99 mmol/L (ref 98–111)
Creatinine, Ser: 0.93 mg/dL (ref 0.44–1.00)
GFR, Estimated: >60 mL/min (ref >60)
Glucose, Bld: 202 mg/dL — ABNORMAL HIGH (ref 70–99)
Potassium: 4.6 mmol/L (ref 3.5–5.1)
Sodium: 139 mmol/L (ref 135–145)

## 2020-12-22 LAB — CBG MONITORING, ED
Glucose-Capillary: 162 mg/dL — ABNORMAL HIGH (ref 70–99)
Glucose-Capillary: 208 mg/dL — ABNORMAL HIGH (ref 70–99)
Glucose-Capillary: 265 mg/dL — ABNORMAL HIGH (ref 70–99)
Glucose-Capillary: 227 mg/dL — ABNORMAL HIGH (ref 70–99)

## 2020-12-22 LAB — HEMOGLOBIN A1C
Hgb A1c MFr Bld: 7.6 % — ABNORMAL HIGH (ref 4.8–5.6)
Mean Plasma Glucose: 171 mg/dL

## 2020-12-22 MED ORDER — BENZONATATE 100 MG PO CAPS
100.0000 mg | ORAL_CAPSULE | Freq: Two times a day (BID) | ORAL | Status: DC | PRN
  Administered 2020-12-22: 100 mg via ORAL
  Filled 2020-12-22: qty 1

## 2020-12-22 MED ORDER — INSULIN ASPART 100 UNIT/ML IJ SOLN
0.0000 [IU] | Freq: Three times a day (TID) | INTRAMUSCULAR | Status: DC
  Administered 2020-12-23: 11 [IU] via SUBCUTANEOUS
  Administered 2020-12-23: 7 [IU] via SUBCUTANEOUS
  Administered 2020-12-23: 4 [IU] via SUBCUTANEOUS
  Administered 2020-12-24: 3 [IU] via SUBCUTANEOUS
  Filled 2020-12-22 (×4): qty 1

## 2020-12-22 MED ORDER — HYDROCOD POLST-CPM POLST ER 10-8 MG/5ML PO SUER
5.0000 mL | Freq: Two times a day (BID) | ORAL | Status: DC | PRN
Start: 2020-12-22 — End: 2020-12-24
  Administered 2020-12-22 – 2020-12-23 (×2): 5 mL via ORAL
  Filled 2020-12-22 (×2): qty 5

## 2020-12-22 NOTE — Assessment & Plan Note (Signed)
BP controlled -Continue ARB and BB

## 2020-12-22 NOTE — Assessment & Plan Note (Signed)
Hgb stable relative to abseline 

## 2020-12-22 NOTE — Assessment & Plan Note (Signed)
HR normal.  Not clear if this is Afib, not on AC. -Continue amiodarone

## 2020-12-22 NOTE — Assessment & Plan Note (Signed)
Continue levothyroxine 

## 2020-12-22 NOTE — Assessment & Plan Note (Signed)
P/w wheezing, SOB and URI symptoms -Continue steroids -Continue bronchodilators -Continue azithromycin -Continue home spiriva

## 2020-12-22 NOTE — Assessment & Plan Note (Addendum)
Glucoes high - Check A1c  - Hold Farxiga and metformin - Continue SS correction insulin, increase dose - Continue aspirin and atorvastatin

## 2020-12-22 NOTE — Assessment & Plan Note (Signed)
Recent echo last Jan showed E 40-45% Net -1.1 L yesterday, still extremely dyspneic with exertion. Creatinine stable, potassium normal. -Furosemide 40 mg IV twice a day    -Strict I/Os, daily weights, telemetry  -Daily monitoring renal function -Continue ARB, BB

## 2020-12-22 NOTE — Assessment & Plan Note (Addendum)
Procal low.  Bacterial Pneumonia seems unlikely.  COVID negative.  Flu negative.  RVP negative. - Stop Rocephin - Repeat chest x-ray in 4 weeks

## 2020-12-22 NOTE — Progress Notes (Signed)
Assumed care of pt/ resting comfortably watching tv/ call bell within reach

## 2020-12-22 NOTE — Progress Notes (Signed)
Pt cleaned and new pur wick applied after incontinent episode / meal tray provided

## 2020-12-22 NOTE — Progress Notes (Signed)
Progress Note    Kathryn [name]   EUM:[id number]  DOB: [birth year]  DOA: 12/21/2020     1 Date of Service: 12/22/2020     Brief summary: Mrs. Kathryn [name] is an 87 y.o. female with dCHF, COPD not on home O2, DM, hypothyroidism, and HTN who presented with 3 weeks of URI symptoms, now worsening.   Patient was in her usual state of health until few weeks ago she had sore throat URI symptoms and cough (congestion, rhinorrhea).  This progressed to worsening cough, fatigue, shortness of breath with exertion, and significantly worsened in the day prior to admission so she came to the ER.   In the ER, chest x-ray showed multifocal infiltrates, small pleural effusions.  White count was.  She was tachypneic and tachycardic and had a low-grade fever so she was started on antibiotics, steroids and the hospitalist service were asked to evaluate.  Admitted and started on diuretics, steroids, bronchodilators.       Subjective:  She is still very dyspneic with minimal exertion Just transferring from bed to the bedside commode.  She has no swelling, but she is orthopneic and coughing.  No fever.  No sputum.  No confusion.  Hospital Problems * Acute on chronic combined systolic and diastolic CHF (congestive heart failure) ([place]) Recent echo last Jan showed E 40-45% Net -1.1 L yesterday, still extremely dyspneic with exertion. Creatinine stable, potassium normal. -Furosemide 40 mg IV twice a day    -Strict I/Os, daily weights, telemetry  -Daily monitoring renal function -Continue ARB, BB    Multifocal pneumonia Procal low.  Bacterial Pneumonia seems unlikely.  COVID negative.  Flu negative.  RVP negative. - Stop Rocephin - Repeat chest x-ray in 4 weeks  COPD exacerbation (HCC) P/w wheezing, SOB and URI symptoms -Continue steroids -Continue bronchodilators -Continue azithromycin -Continue home spiriva  Diabetes mellitus type 2, uncomplicated (HCC) Glucoes high - Check A1c  - Hold Farxiga  and metformin - Continue SS correction insulin, increase dose - Continue aspirin and atorvastatin  Hypothyroidism - Continue levothyroxine  Normocytic anemia Hgb stable relative to abseline  PSVT (paroxysmal supraventricular tachycardia) (HCC) HR normal.  Not clear if this is Afib, not on AC. -Continue amiodarone  Essential hypertension BP controlled -Continue ARB and BB     Objective Vital signs were reviewed and unremarkable.  Vitals:   12/22/20 0818 12/22/20 1131 12/22/20 1300 12/22/20 1637  BP: (!) 143/62 124/74 (!) 107/57 124/62  Pulse: 95 83 81 87  Resp: 20 18 16    Temp: 98.9 F (37.2 C) 98 F (36.7 C)    TempSrc:      SpO2: 95% 95% 96% 92%  Weight:      Height:       78.9 kg  Exam General appearance: Older adult female, lying in bed, no acute distress.  Interactive.  Sitting up eating lunch.     HEENT: Anicteric, conjunctival pink, lids and lashes normal.  No nasal deformity, discharge, or epistaxis Skin:  Cardiac: RRR, no murmurs, no lower extremity edema, no JVD. Respiratory: Lung sounds diminished, at both bases.  No rales.  No wheezing.  Respiratory effort increased with minimal exertion. Abdomen: Abdomen soft without tenderness palpation or guarding, no ascites or distention. MSK: Normal muscle bulk and tone for age.  No deformities or effusions of the large joints of the upper or lower extremities bilaterally Neuro: Awake and alert, extraocular movements intact, moves upper extremities with generalized weakness but symmetric strength, speech fluent. Psych: Attention normal, affect  appropriate, judgment insight appear normal     Labs / Other Information My review of labs, imaging, notes and other tests is significant for Normal respiratory virus panel, creatinine stable, potassium normal.  Procalcitonin undetectable.     Time spent: 35 minutes [facility] 12/22/2020, 6:23 PM

## 2020-12-22 NOTE — Progress Notes (Signed)
Pt placed in hospital bed/ call bell in reach

## 2020-12-22 NOTE — Hospital Course (Addendum)
Kathryn [name] is an 87 y.o. female with dCHF, COPD not on home O2, DM, hypothyroidism, and HTN who presented with 3 weeks of URI symptoms, now worsening.   Patient was in her usual state of health until few weeks ago she had sore throat URI symptoms and cough (congestion, rhinorrhea).  This progressed to worsening cough, fatigue, shortness of breath with exertion, and significantly worsened in the day prior to admission so she came to the ER.   In the ER, chest x-ray showed multifocal infiltrates, small pleural effusions.  White count was.  She was tachypneic and tachycardic and had a low-grade fever so she was started on antibiotics, steroids and the hospitalist service were asked to evaluate.  9/28: Admitted and started on diuretics, steroids, bronchodilators. 9/30: Procals stayed low, stopped abx, improving with diuresis, still desatting  By 10/1 the patient was off O2, ambulating near baseline, had no further swelling.

## 2020-12-23 DIAGNOSIS — E119 Type 2 diabetes mellitus without complications: Secondary | ICD-10-CM

## 2020-12-23 DIAGNOSIS — I10 Essential (primary) hypertension: Secondary | ICD-10-CM

## 2020-12-23 DIAGNOSIS — E039 Hypothyroidism, unspecified: Secondary | ICD-10-CM

## 2020-12-23 DIAGNOSIS — J441 Chronic obstructive pulmonary disease with (acute) exacerbation: Secondary | ICD-10-CM

## 2020-12-23 LAB — PROCALCITONIN: Procalcitonin: <0.1 ng/mL

## 2020-12-23 LAB — CBG MONITORING, ED
Glucose-Capillary: 162 mg/dL — ABNORMAL HIGH (ref 70–99)
Glucose-Capillary: 294 mg/dL — ABNORMAL HIGH (ref 70–99)
Glucose-Capillary: 246 mg/dL — ABNORMAL HIGH (ref 70–99)

## 2020-12-23 LAB — BASIC METABOLIC PANEL
Anion gap: 11 (ref 5–15)
BUN: 31 mg/dL — ABNORMAL HIGH (ref 8–23)
CO2: 27 mmol/L (ref 22–32)
Calcium: 9.1 mg/dL (ref 8.9–10.3)
Chloride: 100 mmol/L (ref 98–111)
Creatinine, Ser: 0.91 mg/dL (ref 0.44–1.00)
GFR, Estimated: >60 mL/min (ref >60)
Glucose, Bld: 165 mg/dL — ABNORMAL HIGH (ref 70–99)
Potassium: 3.6 mmol/L (ref 3.5–5.1)
Sodium: 138 mmol/L (ref 135–145)

## 2020-12-23 LAB — GLUCOSE, CAPILLARY: Glucose-Capillary: 212 mg/dL — ABNORMAL HIGH (ref 70–99)

## 2020-12-23 MED ORDER — FUROSEMIDE 10 MG/ML IJ SOLN
60.0000 mg | Freq: Two times a day (BID) | INTRAMUSCULAR | Status: DC
  Administered 2020-12-23 – 2020-12-24 (×2): 60 mg via INTRAVENOUS
  Filled 2020-12-23: qty 8
  Filled 2020-12-23: qty 6

## 2020-12-23 MED ORDER — INSULIN ASPART 100 UNIT/ML IJ SOLN
4.0000 [IU] | Freq: Three times a day (TID) | INTRAMUSCULAR | Status: DC
  Administered 2020-12-24: 4 [IU] via SUBCUTANEOUS
  Filled 2020-12-23: qty 1

## 2020-12-23 MED ORDER — POTASSIUM CHLORIDE CRYS ER 20 MEQ PO TBCR
20.0000 meq | EXTENDED_RELEASE_TABLET | Freq: Every day | ORAL | Status: DC
  Administered 2020-12-23 – 2020-12-24 (×2): 20 meq via ORAL
  Filled 2020-12-23: qty 1
  Filled 2020-12-23: qty 2

## 2020-12-23 MED ORDER — AZITHROMYCIN 250 MG PO TABS
500.0000 mg | ORAL_TABLET | Freq: Every day | ORAL | Status: DC
  Administered 2020-12-24: 500 mg via ORAL
  Filled 2020-12-23: qty 2

## 2020-12-23 MED ORDER — FUROSEMIDE 10 MG/ML IJ SOLN
20.0000 mg | Freq: Once | INTRAMUSCULAR | Status: AC
  Administered 2020-12-23: 20 mg via INTRAVENOUS
  Filled 2020-12-23: qty 4

## 2020-12-23 NOTE — Progress Notes (Signed)
  Progress Note    Kathryn [name]   SNK:[id number]  DOB: 12-18-1933  DOA: 12/21/2020     2 Date of Service: 12/23/2020    Brief summary: Kathryn [name] is an 87 y.o. female with dCHF, COPD not on home O2, DM, hypothyroidism, and HTN who presented with 3 weeks of URI symptoms, now worsening.   Patient was in her usual state of health until few weeks ago she had sore throat URI symptoms and cough (congestion, rhinorrhea).  This progressed to worsening cough, fatigue, shortness of breath with exertion, and significantly worsened in the day prior to admission so she came to the ER.   In the ER, chest x-ray showed multifocal infiltrates, small pleural effusions.  White count was.  She was tachypneic and tachycardic and had a low-grade fever so she was started on antibiotics, steroids and the hospitalist service were asked to evaluate.  Admitted and started on diuretics, steroids, bronchodilators.      Subjective:  Cough is improving, dyspnea with exertion is improving.  Swelling is improving.  No fever, sputum, headache,  confusion, vomiting.  Hospital Problems * Acute on chronic combined systolic and diastolic CHF (congestive heart failure) ([place]) Recent echo last Jan showed E 40-45% Net -1 L yesterday, 2.1L on admission.  Desat to 85% with ambulation today with PT, although overall improving. Creatinine stable, potassium normal. -Continue Furosemide increase to 60 mg IV twice a day  - Start K suppl -Strict I/Os, daily weights, telemetry  -Daily monitoring renal function -Continue ARB, BB    Multifocal pneumonia Procal undetectable.  Bacterial Pneumonia seems unlikely.  COVID negative.  Flu negative.  RVP negative.  Antibiotics stopped yesterday. - Repeat chest x-ray in 4 weeks  COPD exacerbation (HCC) P/w wheezing, SOB and URI symptoms Wheezing improved.   -Continue prednisone one more day, then switch to budesonide -Continue bronchodilators -Continue azithromycin -Continue  home spiriva  Diabetes mellitus type 2, uncomplicated (HCC) Glucose elevated A1c 7.6% excellent for age. - Hold Farxiga and metformin - Continue SS correction insulin  -Add mealtime insulin with hold instructions - Continue aspirin and atorvastatin  Hypothyroidism - Continue levothyroxine  Normocytic anemia Hgb stable relative to abseline  PSVT (paroxysmal supraventricular tachycardia) (HCC) HR normal.  Patient had ZIO in the past, mostly just NSVT and PSVT, symptomatic and so started on amiodarone -Continue amiodarone  Essential hypertension BP slightly elevated -Continue ARB and BB     Objective Vital signs were reviewed and unremarkable except for: Blood pressure: Slightly elevated  BP 138/63   Pulse 69   Temp 98.4 F (36.9 C) (Oral)   Resp 20   Ht 5' (1.524 m)   Wt 78.9 kg   SpO2 92%   BMI 33.98 kg/m      Exam General appearance: Adult female, elderly, lying in bed, interactive, looking at her phone.     HEENT:    Skin:  Cardiac: RRR, no murmurs, no JVD, no lower extremity edema Respiratory: Normal respiratory rate and rhythm, only observed at rest, no rales or wheezes Abdomen: Abdomen soft without tenderness to palpation or guarding. MSK:  Neuro: Awake and alert, extraocular movements intact moves all extremities with generalized weakness, symmetric strength. Psych: Affect normal, judgment insight appear normal.    Labs / Other Information My review of labs, imaging, notes and other tests is significant for Stable creatinine and potassium.  Procalcitonin normal.  WBC normal.     Time spent: 25 minutes [facility] 12/23/2020, 5:36 PM

## 2020-12-23 NOTE — Assessment & Plan Note (Signed)
Procal undetectable.  Bacterial Pneumonia seems unlikely.  COVID negative.  Flu negative.  RVP negative.  Antibiotics stopped yesterday. - Repeat chest x-ray in 4 weeks

## 2020-12-23 NOTE — Assessment & Plan Note (Signed)
Glucose elevated A1c 7.6% excellent for age. - Hold Farxiga and metformin - Continue SS correction insulin  -Add mealtime insulin with hold instructions - Continue aspirin and atorvastatin

## 2020-12-23 NOTE — Evaluation (Signed)
Physical Therapy Evaluation Patient Details Name: Kathryn [name] MRN: [id number] DOB: 03-Jan-1934 Today's Date: 12/23/2020  History of Present Illness  Kathryn [name] is a 87 y.o. female with medical history significant of dCHF, COPD not on home O2, DM, hypothyroidism, and HTN who presented with 3 weeks of URI symptoms, now worsening. Pt main concrns is worsening cough, fatigue, and shortness of breath with exertion.   Clinical Impression  Pt is a Kathryn 87 year old patient who presents to PT evaluation of increased shortness of breath with exertion. Prior to admission, pt was modified independent for household mobility with intermittent usage of ADs and uses WC for longer distances requiring an additional person to assist with propulsion. Pt reports that she has good family support who would be able to assist her as needed if she were to return home. Pt able to ambulate in room with usage of RW without LOB noted. Pt's oxygen saturation did drop to 86% during ambulation trial on room air and with seated rest oxygen returned to >90% in less than 5 seconds and cues for pursed lip breathing. Recommending home health PT to improve pt's activity tolerance and balance to reduce likelihood of falls. Pt will benefit from skilled PT services during hospitalization to maximize independence.     Recommendations for follow up therapy are one component of a multi-disciplinary discharge planning process, led by the attending physician.  Recommendations may be updated based on patient status, additional functional criteria and insurance authorization.  Follow Up Recommendations Home health PT;Supervision - Intermittent    Equipment Recommendations  None recommended by PT    Recommendations for Other Services       Precautions / Restrictions Precautions Precautions: Fall Restrictions Weight Bearing Restrictions: No      Mobility  Bed Mobility Overal bed mobility: Independent                   Transfers Overall transfer level: Needs assistance Equipment used: Rolling walker (2 wheeled) Transfers: Sit to/from Stand Sit to Stand: Modified independent (Device/Increase time)         General transfer comment: Able to stand at EOB without any LOB. Able to accept mild balance perturbations without UE assistance while donning brief  Ambulation/Gait Ambulation/Gait assistance: Supervision Gait Distance (Feet): 20 Feet Assistive device: Rolling walker (2 wheeled) Gait Pattern/deviations: Step-through pattern;Decreased stride length     General Gait Details: Pt able to ambulate with RW without loss of balance noticed. Pt ambulates with additional caution and reported SOB after short ambulation trial.  Stairs            Wheelchair Mobility    Modified Rankin (Stroke Patients Only)       Balance                                             Pertinent Vitals/Pain Pain Assessment: No/denies pain    Home Living Family/patient expects to be discharged to:: Private residence Living Arrangements: Children Available Help at Discharge: Family;Available PRN/intermittently Type of Home: Apartment Home Access: Stairs to enter Entrance Stairs-Rails: Right Entrance Stairs-Number of Steps: 2 Home Layout: One level Home Equipment: Walker - 4 wheels;Wheelchair - manual;Cane - single point;Walker - 2 wheels;Shower seat Additional Comments: Pt repots living in a small apartment behind her son's house. She reports having younger sisters who live near by who would be able  to assist as needed.    Prior Function Level of Independence: Independent with assistive device(s)         Comments: Pt reports usage of WC for longer distances for grocery shopping. She reports most of the time she does not require ADs for household ambulation.     Hand Dominance   Dominant Hand: Right    Extremity/Trunk Assessment   Upper Extremity Assessment Upper Extremity  Assessment: Overall WFL for tasks assessed    Lower Extremity Assessment Lower Extremity Assessment: Generalized weakness;Overall WFL for tasks assessed       Communication   Communication: No difficulties  Cognition Arousal/Alertness: Awake/alert Behavior During Therapy: WFL for tasks assessed/performed Overall Cognitive Status: Within Functional Limits for tasks assessed                                        General Comments      Exercises Other Exercises Other Exercises: Pt educated on discharge recommendations to improve strength to reduce risks of falls.   Assessment/Plan    PT Assessment Patient needs continued PT services  PT Problem List Decreased strength;Decreased activity tolerance;Decreased balance       PT Treatment Interventions DME instruction;Gait training;Stair training;Functional mobility training;Therapeutic activities;Therapeutic exercise;Balance training;Neuromuscular re-education;Manual techniques    PT Goals (Current goals can be found in the Care Plan section)  Acute Rehab PT Goals Patient Stated Goal: to go home PT Goal Formulation: With patient Time For Goal Achievement: 01/06/21 Potential to Achieve Goals: Good    Frequency Min 2X/week   Barriers to discharge        Co-evaluation               AM-PAC PT "6 Clicks" Mobility  Outcome Measure Help needed turning from your back to your side while in a flat bed without using bedrails?: None Help needed moving from lying on your back to sitting on the side of a flat bed without using bedrails?: None Help needed moving to and from a bed to a chair (including a wheelchair)?: None Help needed standing up from a chair using your arms (e.g., wheelchair or bedside chair)?: None Help needed to walk in hospital room?: A Little Help needed climbing 3-5 steps with a railing? : A Little 6 Click Score: 22    End of Session Equipment Utilized During Treatment: Gait  belt Activity Tolerance: Patient tolerated treatment well Patient left: in bed;with call bell/phone within reach Nurse Communication: Mobility status PT Visit Diagnosis: Unsteadiness on feet (R26.81);Muscle weakness (generalized) (M62.81)    Time: 8264-1583 PT Time Calculation (min) (ACUTE ONLY): 29 min   Charges:   PT Evaluation $PT Eval Low Complexity: 1 Low PT Treatments $Therapeutic Activity: 8-22 mins        Kathryn [name], SPT  Kathryn [name] 12/23/2020, 1:01 PM

## 2020-12-23 NOTE — Assessment & Plan Note (Addendum)
BP slightly elevated -Continue ARB and BB

## 2020-12-23 NOTE — Assessment & Plan Note (Addendum)
HR normal.  Patient had ZIO in the past, mostly just NSVT and PSVT, symptomatic and so started on amiodarone -Continue amiodarone

## 2020-12-23 NOTE — Telephone Encounter (Signed)
Patient was seen in the ED 12/21/2020.

## 2020-12-23 NOTE — Assessment & Plan Note (Signed)
Recent echo last Jan showed E 40-45% Net -1 L yesterday, 2.1L on admission.  Desat to 85% with ambulation today with PT, although overall improving. Creatinine stable, potassium normal. -Continue Furosemide increase to 60 mg IV twice a day  - Start K suppl -Strict I/Os, daily weights, telemetry  -Daily monitoring renal function -Continue ARB, BB

## 2020-12-23 NOTE — ED Notes (Signed)
Breakfast tray delivered to pt at this time.  

## 2020-12-23 NOTE — Progress Notes (Signed)
Inpatient Diabetes Program Recommendations  AACE/ADA: New Consensus Statement on Inpatient Glycemic Control (2015)  Target Ranges:  Prepandial:   less than 140 mg/dL      Peak postprandial:   less than 180 mg/dL (1-2 hours)      Critically ill patients:  140 - 180 mg/dL  Results for Kathryn [name], Kathryn [name] (MRN [id number]) as of 12/23/2020 09:42  Ref. Range 12/22/2020 07:59 12/22/2020 11:24 12/22/2020 16:33 12/22/2020 21:39  Glucose-Capillary Latest Ref Range: 70 - 99 mg/dL 162 (H)  3 units Novolog  208 (H)  5 units Novolog  265 (H)  8 units Novolog  227 (H)  2 units Novolog   Results for Kathryn [name], Kathryn [name] (MRN [id number]) as of 12/23/2020 09:42  Ref. Range 12/22/2020 06:37  Hemoglobin A1C Latest Ref Range: 4.8 - 5.6 % 7.6 (H)   Admit with: Acute on chronic combined systolic and diastolic CHF/ Multifocal pneumonia  History: DM2  Home DM Meds: Farxiga 10 mg daily       Metformin 1000 mg QPM  Current Orders: Novolog Resistant Correction Scale/ SSI (0-20 units) TID AC + HS      MD- Note patient getting Prednisone 40 mg daily  Afternoon CBGs elevated  Please consider adding Novolog Meal Coverage while patient getting Prednisone:  Novolog 4 units TID with meals  Hold if pt eats <50% of meal, Hold if pt NPO    --Will follow patient during hospitalization--  Wyn Quaker RN, MSN, CDE Diabetes Coordinator Inpatient Glycemic Control Team Team Pager: [phone] (8a-5p)

## 2020-12-23 NOTE — Assessment & Plan Note (Signed)
Hgb stable relative to abseline 

## 2020-12-23 NOTE — Progress Notes (Signed)
PHARMACIST - PHYSICIAN COMMUNICATION  DR:   Loleta Books  CONCERNING: IV to Oral Route Change Policy  RECOMMENDATION: This patient is receiving Azithromycin by the intravenous route.  Based on criteria approved by the Pharmacy and Therapeutics Committee, the intravenous medication(s) is/are being converted to the equivalent oral dose form(s).   DESCRIPTION: These criteria include: The patient is eating (either orally or via tube) and/or has been taking other orally administered medications for a least 24 hours The patient has no evidence of active gastrointestinal bleeding or impaired GI absorption (gastrectomy, short bowel, patient on TNA or NPO).  If you have questions about this conversion, please contact the Pharmacy Department  []   [phone] )  [facility] [x]   [phone] )  [facility] []   [phone] )  [facility] []   [phone] )  [facility] []   [phone] )  Brushy Rodriguez-Guzman PharmD, BCPS 12/23/2020 1:08 PM

## 2020-12-23 NOTE — ED Notes (Signed)
Pt noted to have desaturation to 85% on room air while lying in bed watching TV. Pt does not appear to have any increased WOB, denies SOB. 2L [place] applied, SaO2 improved to 88%. SaO2 improved to 89% on 3L [place]. Pt is currently on 4L [place] with SaO2 92-94%. Secure chat sent to Dr. Loleta Books for notification of oxygen requirement.

## 2020-12-23 NOTE — Assessment & Plan Note (Signed)
P/w wheezing, SOB and URI symptoms Wheezing improved.   -Continue prednisone one more day, then switch to budesonide -Continue bronchodilators -Continue azithromycin -Continue home spiriva

## 2020-12-23 NOTE — ED Notes (Signed)
Informed rn bed assigned 

## 2020-12-23 NOTE — Assessment & Plan Note (Signed)
Continue levothyroxine 

## 2020-12-24 LAB — BASIC METABOLIC PANEL WITH GFR
Anion gap: 12 (ref 5–15)
BUN: 33 mg/dL — ABNORMAL HIGH (ref 8–23)
CO2: 28 mmol/L (ref 22–32)
Calcium: 8.9 mg/dL (ref 8.9–10.3)
Chloride: 97 mmol/L — ABNORMAL LOW (ref 98–111)
Creatinine, Ser: 0.99 mg/dL (ref 0.44–1.00)
GFR, Estimated: 56 mL/min — ABNORMAL LOW
Glucose, Bld: 141 mg/dL — ABNORMAL HIGH (ref 70–99)
Potassium: 3.8 mmol/L (ref 3.5–5.1)
Sodium: 137 mmol/L (ref 135–145)

## 2020-12-24 LAB — HEMOGLOBIN A1C
Hgb A1c MFr Bld: 7.8 % — ABNORMAL HIGH (ref 4.8–5.6)
Mean Plasma Glucose: 177 mg/dL

## 2020-12-24 LAB — GLUCOSE, CAPILLARY: Glucose-Capillary: 135 mg/dL — ABNORMAL HIGH (ref 70–99)

## 2020-12-24 NOTE — Plan of Care (Signed)

## 2020-12-24 NOTE — Assessment & Plan Note (Signed)
Recent echo last Jan showed EF 40-45%.  Admitted and started on IV Lasix.  Diuresed 5L, Cr and K stable.  Ambulated without hypoxia.  Dyspnea on exertion almost resolved.  No swelling.    Discharged with emphasis on daily weights.  Will double torsemide the next 2 days, then resume once daily.  Cardiology and PCP follow up in the next 2 weeks

## 2020-12-24 NOTE — Discharge Summary (Signed)
Physician Discharge Summary   Patient name: Kathryn [name]  Admit date:     12/21/2020  Discharge date: 12/24/2020  Attending Physician: Edwin Dada [[id number]]  Discharge Physician: Edwin Dada   PCP: Jerrol Banana., MD     Recommendations at discharge:  Follow up with PCP Dr. Rosanna Randy in 1 week Follow up with Cardiology Dr. Rockey Situ as needed Dr. Rosanna Randy: Please obtain BMP in 1 week      Follow-up Information     Jerrol Banana., MD. Schedule an appointment as soon as possible for a visit in 1 week(s).   Specialty: Family Medicine Contact information: [address] [place] [address] [phone]         Minna Merritts, MD .   Specialty: Cardiology Contact information: [place] [place] [address] [phone]                   Discharge Diagnoses Principal Problem:   Acute on chronic combined systolic and diastolic CHF (congestive heart failure) ([place]) Active Problems:   COPD exacerbation (HCC)   Hypothyroidism   Diabetes mellitus type 2, uncomplicated (HCC)   Essential hypertension   PSVT (paroxysmal supraventricular tachycardia) (HCC)   Normocytic anemia     Hospital Course   Kathryn [name] is an 87 y.o. female with dCHF, COPD not on home O2, DM, hypothyroidism, and HTN who presented with 3 weeks of URI symptoms, now worsening.   Patient was in her usual state of health until few weeks ago she had sore throat URI symptoms and cough (congestion, rhinorrhea).  This progressed to worsening cough, fatigue, shortness of breath with exertion, and significantly worsened in the day prior to admission so she came to the ER.   In the ER, chest x-ray showed multifocal infiltrates, small pleural effusions.  White count was.  She was tachypneic and tachycardic and had a low-grade fever so she was started on antibiotics, steroids and the hospitalist service were asked to evaluate.  9/28:  Admitted and started on diuretics, steroids, bronchodilators. 9/30: Procals stayed low, stopped abx, improving with diuresis, still desatting  By 10/1 the patient was off O2, ambulating near baseline, had no further swelling.   * Acute on chronic combined systolic and diastolic CHF (congestive heart failure) ([place]) Recent echo last Jan showed EF 40-45%.  Admitted and started on IV Lasix.  Diuresed 5L, Cr and K stable.  Ambulated without hypoxia.  Dyspnea on exertion almost resolved.  No swelling.    Discharged with emphasis on daily weights.  Will double torsemide the next 2 days, then resume once daily.  Cardiology and PCP follow up in the next 2 weeks  COPD exacerbation ([place]) Initially treated with steroids and bronchodilators and azithromycin.  Improved mostly with diuresis, and I do not think COPD flare was a major contributor.  Will not discharge on steroids.         Condition at discharge: good  Exam Physical Exam Vitals reviewed.  Constitutional:      General: She is not in acute distress.    Appearance: Normal appearance. She is not toxic-appearing.  Cardiovascular:     Rate and Rhythm: Normal rate and regular rhythm.     Heart sounds: No murmur heard.   No gallop.  Pulmonary:     Effort: Pulmonary effort is normal.     Breath sounds: Normal breath sounds. No wheezing or rales.  Abdominal:     General: Abdomen is  flat.     Palpations: Abdomen is soft.     Tenderness: There is no guarding or rebound.  Skin:    General: Skin is warm and dry.  Neurological:     General: No focal deficit present.     Mental Status: She is alert and oriented to person, place, and time.     Motor: No weakness.  Psychiatric:        Mood and Affect: Mood normal.        Behavior: Behavior normal.        Thought Content: Thought content normal.        Judgment: Judgment normal.      Disposition: Home  Discharge time: less than 30 minutes. Allergies as of 12/24/2020        Reactions   Oxycodone Other (See Comments)   Mental status change   Prednisone    swelling, bruising.        Medication List     TAKE these medications    acetaminophen 500 MG tablet Commonly known as: TYLENOL Take 1,000 mg by mouth every 6 (six) hours as needed. Pain   albuterol (2.5 MG/3ML) 0.083% nebulizer solution Commonly known as: PROVENTIL Take 3 mLs (2.5 mg total) by nebulization every 6 (six) hours as needed for wheezing or shortness of breath.   amiodarone 200 MG tablet Commonly known as: PACERONE TAKE 1 TABLET(200 MG) BY MOUTH DAILY AS DIRECTED   aspirin EC 81 MG tablet Take 1 tablet (81 mg total) by mouth daily. Swallow whole.   atorvastatin 40 MG tablet Commonly known as: LIPITOR Take 1 tablet (40 mg total) by mouth daily.   carvedilol 6.25 MG tablet Commonly known as: COREG Take 1 tablet (6.25 mg total) by mouth 2 (two) times daily with a meal.   dapagliflozin propanediol 10 MG Tabs tablet Commonly known as: Farxiga Take 1 tablet (10 mg total) by mouth daily before breakfast.   glucose blood test strip Commonly known as: Contour Next Test Check sugar once daily  DX E11.9   hydrOXYzine 10 MG tablet Commonly known as: ATARAX/VISTARIL TAKE 1 TO 2 TABLETS(10 TO 20 MG) BY MOUTH EVERY 6 HOURS AS NEEDED   levothyroxine 100 MCG tablet Commonly known as: SYNTHROID TAKE 1 TABLET(100 MCG) BY MOUTH DAILY BEFORE BREAKFAST   losartan 25 MG tablet Commonly known as: COZAAR Take 1 tablet (25 mg total) by mouth every evening.   metFORMIN 1000 MG tablet Commonly known as: GLUCOPHAGE TAKE 1 TABLET(1000 MG) BY MOUTH DAILY WITH SUPPER   omeprazole 20 MG capsule Commonly known as: PRILOSEC TAKE 1 CAPSULE(20 MG) BY MOUTH DAILY   tiotropium 18 MCG inhalation capsule Commonly known as: SPIRIVA Place 1 capsule (18 mcg total) into inhaler and inhale daily as needed.   torsemide 20 MG tablet Commonly known as: DEMADEX Take 20 mg po daily in AM. Take one extra  dose 20mg  PRN 3lb weight gain or abdominal fullness.        DG Chest 2 View  Result Date: 12/21/2020 CLINICAL DATA:  Shortness of breath, cough for 3 weeks, history of COPD EXAM: CHEST - 2 VIEW COMPARISON:  Chest radiograph 06/06/2020 FINDINGS: The heart is at the upper limits of normal for size. There is calcified atherosclerotic plaque of the aortic arch. The mediastinal contours are within normal limits. There is patchy airspace disease in the right upper and lower lobes and lateral left base, increased compared to the study from 06/06/2020. There are small bilateral pleural effusions. There  is no pneumothorax. There is no acute osseous abnormality. IMPRESSION: Patchy airspace disease in the right upper and lower lobes and lateral left base with small bilateral pleural effusions raises suspicion for multifocal pneumonia. Recommend follow-up radiographs in 6-8 weeks to assess for resolution. Electronically Signed   By: Valetta Mole M.D.   On: 12/21/2020 08:55   Results for orders placed or performed during the hospital encounter of 12/21/20  Resp Panel by RT-PCR (Flu A&B, Covid) Nasopharyngeal Swab     Status: None   Collection Time: 12/21/20  8:57 AM   Specimen: Nasopharyngeal Swab; Nasopharyngeal(NP) swabs in vial transport medium  Result Value Ref Range Status   SARS Coronavirus 2 by RT PCR NEGATIVE NEGATIVE Final    Comment: (NOTE) SARS-CoV-2 target nucleic acids are NOT DETECTED.  The SARS-CoV-2 RNA is generally detectable in upper respiratory specimens during the acute phase of infection. The lowest concentration of SARS-CoV-2 viral copies this assay can detect is 138 copies/mL. A negative result does not preclude SARS-Cov-2 infection and should not be used as the sole basis for treatment or other patient management decisions. A negative result may occur with  improper specimen collection/handling, submission of specimen other than nasopharyngeal swab, presence of viral mutation(s)  within the areas targeted by this assay, and inadequate number of viral copies(<138 copies/mL). A negative result must be combined with clinical observations, patient history, and epidemiological information. The expected result is Negative.  Fact Sheet for Patients:  EntrepreneurPulse.com.au  Fact Sheet for Healthcare Providers:  IncredibleEmployment.be  This test is no t yet approved or cleared by the [place] FDA and  has been authorized for detection and/or diagnosis of SARS-CoV-2 by FDA under an Emergency Use Authorization (EUA). This EUA will remain  in effect (meaning this test can be used) for the duration of the COVID-19 declaration under Section 564(b)(1) of the Act, 21 U.S.C.section 360bbb-3(b)(1), unless the authorization is terminated  or revoked sooner.       Influenza A by PCR NEGATIVE NEGATIVE Final   Influenza B by PCR NEGATIVE NEGATIVE Final    Comment: (NOTE) The Xpert Xpress SARS-CoV-2/FLU/RSV plus assay is intended as an aid in the diagnosis of influenza from Nasopharyngeal swab specimens and should not be used as a sole basis for treatment. Nasal washings and aspirates are unacceptable for Xpert Xpress SARS-CoV-2/FLU/RSV testing.  Fact Sheet for Patients: EntrepreneurPulse.com.au  Fact Sheet for Healthcare Providers: IncredibleEmployment.be  This test is not yet approved or cleared by the [place] FDA and has been authorized for detection and/or diagnosis of SARS-CoV-2 by FDA under an Emergency Use Authorization (EUA). This EUA will remain in effect (meaning this test can be used) for the duration of the COVID-19 declaration under Section 564(b)(1) of the Act, 21 U.S.C. section 360bbb-3(b)(1), unless the authorization is terminated or revoked.  Performed at [facility], [place], [place] [address]   Respiratory (~20 pathogens) panel by PCR      Status: None   Collection Time: 12/21/20  5:43 PM   Specimen: Nasopharyngeal Swab; Respiratory  Result Value Ref Range Status   Adenovirus NOT DETECTED NOT DETECTED Final   Coronavirus 229E NOT DETECTED NOT DETECTED Final    Comment: (NOTE) The Coronavirus on the Respiratory Panel, DOES NOT test for the novel  Coronavirus (2019 nCoV)    Coronavirus HKU1 NOT DETECTED NOT DETECTED Final   Coronavirus NL63 NOT DETECTED NOT DETECTED Final   Coronavirus OC43 NOT DETECTED NOT DETECTED Final   Metapneumovirus  NOT DETECTED NOT DETECTED Final   Rhinovirus / Enterovirus NOT DETECTED NOT DETECTED Final   Influenza A NOT DETECTED NOT DETECTED Final   Influenza B NOT DETECTED NOT DETECTED Final   Parainfluenza Virus 1 NOT DETECTED NOT DETECTED Final   Parainfluenza Virus 2 NOT DETECTED NOT DETECTED Final   Parainfluenza Virus 3 NOT DETECTED NOT DETECTED Final   Parainfluenza Virus 4 NOT DETECTED NOT DETECTED Final   Respiratory Syncytial Virus NOT DETECTED NOT DETECTED Final   Bordetella pertussis NOT DETECTED NOT DETECTED Final   Bordetella Parapertussis NOT DETECTED NOT DETECTED Final   Chlamydophila pneumoniae NOT DETECTED NOT DETECTED Final   Mycoplasma pneumoniae NOT DETECTED NOT DETECTED Final    Comment: Performed at [facility], [place] [address] [place], [place] [address]    Signed:  Edwin Dada MD.  [facility] 12/24/2020, 4:11 PM

## 2020-12-24 NOTE — Assessment & Plan Note (Signed)
Initially treated with steroids and bronchodilators and azithromycin.  Improved mostly with diuresis, and I do not think COPD flare was a major contributor.  Will not discharge on steroids.

## 2020-12-28 ENCOUNTER — Telehealth: Payer: Self-pay | Admitting: *Deleted

## 2020-12-28 ENCOUNTER — Encounter: Payer: Self-pay | Admitting: *Deleted

## 2020-12-28 DIAGNOSIS — H40013 Open angle with borderline findings, low risk, bilateral: Secondary | ICD-10-CM | POA: Diagnosis not present

## 2020-12-28 DIAGNOSIS — H04129 Dry eye syndrome of unspecified lacrimal gland: Secondary | ICD-10-CM | POA: Diagnosis not present

## 2020-12-28 DIAGNOSIS — H02889 Meibomian gland dysfunction of unspecified eye, unspecified eyelid: Secondary | ICD-10-CM | POA: Diagnosis not present

## 2020-12-28 DIAGNOSIS — H26492 Other secondary cataract, left eye: Secondary | ICD-10-CM | POA: Diagnosis not present

## 2020-12-28 DIAGNOSIS — Z961 Presence of intraocular lens: Secondary | ICD-10-CM | POA: Diagnosis not present

## 2020-12-28 DIAGNOSIS — H1045 Other chronic allergic conjunctivitis: Secondary | ICD-10-CM | POA: Diagnosis not present

## 2020-12-28 LAB — HM DIABETES EYE EXAM

## 2020-12-28 NOTE — Telephone Encounter (Signed)
This encounter was created in error - please disregard.

## 2020-12-28 NOTE — Telephone Encounter (Signed)
Transition Care Management Unsuccessful Follow-up Telephone Call  Date of discharge and from where: [facility]   12/24/20  Attempts:  1st Attempt  Reason for unsuccessful TCM follow-up call:  No answer/busy  Jacqlyn Larsen [facility], BSN RN Case Manager [phone]

## 2020-12-28 NOTE — Telephone Encounter (Signed)
Erroneous encounter.  Jacqlyn Larsen [facility], BSN RN Case Manager [phone]

## 2020-12-29 ENCOUNTER — Ambulatory Visit: Payer: Medicare Other | Admitting: Family

## 2020-12-30 ENCOUNTER — Telehealth: Payer: Self-pay

## 2020-12-30 NOTE — Telephone Encounter (Signed)
Transition Care Management Unsuccessful Follow-up Telephone Call  Date of discharge and from where:  12/24/2020   [facility]  Attempts:  2nd Attempt  Reason for unsuccessful TCM follow-up call:  No answer/busy  Tomasa Rand, RN, BSN, CEN [place] Coordinator [phone]

## 2021-01-03 ENCOUNTER — Ambulatory Visit (INDEPENDENT_AMBULATORY_CARE_PROVIDER_SITE_OTHER): Payer: Medicare Other

## 2021-01-03 DIAGNOSIS — J441 Chronic obstructive pulmonary disease with (acute) exacerbation: Secondary | ICD-10-CM

## 2021-01-03 NOTE — Patient Instructions (Addendum)
Thank you for allowing the Chronic Care Management team to participate in your care.     Patient Care Plan: COPD (Adult)     Problem Identified: Symptom Exacerbation (COPD)      Long-Range Goal: Symptom Exacerbation Prevented or Minimized   Start Date: 01/03/2021  Expected End Date: 04/03/2021  Priority: High  Note:   Current Barriers:  Chronic Disease Management support and educational needs r/t COPD.  Case Manager Clinical Goal(s): Over the next 90 days, patient will not require hospitalization or emergent care due to complications related to COPD.  Interventions:  Collaboration with Jerrol Banana., MD regarding development and update of comprehensive plan of care as evidenced by provider attestation and co-signature Inter-disciplinary care team collaboration (see longitudinal plan of care) Discussed plan since hospitalization on 12/21/20. She was hospitalized and treated for multifocal pneumonia on 12/21/20. Reports taking medications as prescribed. She will complete a post hospitalization visit with her PCP on 01/04/21. Agreed to bring her medications to her visit. Reports improvements today. Denies shortness of breath at rest. Will follow up within the month to discuss any changes in care management needs. Reviewed worsening s/sx that require immediate medical attention.     Self-Care/Patient Goals: Self administer medications as prescribed Attend post hospital follow up as scheduled on 01/04/21 Assess symptoms daily and notify provider if not improving  Calls provider office for new concerns or questions   Follow Up Plan:  Will follow up within the next month      Mrs. [name] verbalized understanding of the information discussed during the telephonic outreach. Declined need for mailed/printed instructions. A member of the care management team will follow up within the next month.   [facility] Care [facility] [phone]

## 2021-01-03 NOTE — Chronic Care Management (AMB) (Signed)
Chronic Care Management   CCM RN Visit Note  01/03/2021 Name: Kathryn [name] MRN: [id number] DOB: Jun 11, 1933  Subjective: Kathryn [name] is a 87 y.o. year old female who is a primary care patient of Jerrol Banana., MD. The care management team was consulted for assistance with disease management and care coordination needs.    Engaged with patient by telephone for follow up visit in response to provider referral for case management and care coordination services.   Consent to Services:  The patient was given information about Chronic Care Management services, agreed to services, and gave verbal consent prior to initiation of services.  Please see initial visit note for detailed documentation.    Assessment: Review of patient past medical history, allergies, medications, health status, including review of consultants reports, laboratory and other test data, was performed as part of comprehensive evaluation and provision of chronic care management services.   SDOH (Social Determinants of Health) assessments and interventions performed:  No  CCM Care Plan  Allergies  Allergen Reactions   Oxycodone Other (See Comments)    Mental status change   Prednisone     swelling, bruising.    Outpatient Encounter Medications as of 01/03/2021  Medication Sig   acetaminophen (TYLENOL) 500 MG tablet Take 1,000 mg by mouth every 6 (six) hours as needed. Pain    albuterol (PROVENTIL) (2.5 MG/3ML) 0.083% nebulizer solution Take 3 mLs (2.5 mg total) by nebulization every 6 (six) hours as needed for wheezing or shortness of breath.   amiodarone (PACERONE) 200 MG tablet TAKE 1 TABLET(200 MG) BY MOUTH DAILY AS DIRECTED   aspirin EC 81 MG tablet Take 1 tablet (81 mg total) by mouth daily. Swallow whole.   atorvastatin (LIPITOR) 40 MG tablet Take 1 tablet (40 mg total) by mouth daily.   carvedilol (COREG) 6.25 MG tablet Take 1 tablet (6.25 mg total) by mouth 2 (two) times daily with a meal.    dapagliflozin propanediol (FARXIGA) 10 MG TABS tablet Take 1 tablet (10 mg total) by mouth daily before breakfast.   glucose blood (CONTOUR NEXT TEST) test strip Check sugar once daily  DX E11.9   hydrOXYzine (ATARAX/VISTARIL) 10 MG tablet TAKE 1 TO 2 TABLETS(10 TO 20 MG) BY MOUTH EVERY 6 HOURS AS NEEDED   levothyroxine (SYNTHROID) 100 MCG tablet TAKE 1 TABLET(100 MCG) BY MOUTH DAILY BEFORE BREAKFAST   losartan (COZAAR) 25 MG tablet Take 1 tablet (25 mg total) by mouth every evening.   metFORMIN (GLUCOPHAGE) 1000 MG tablet TAKE 1 TABLET(1000 MG) BY MOUTH DAILY WITH SUPPER   omeprazole (PRILOSEC) 20 MG capsule TAKE 1 CAPSULE(20 MG) BY MOUTH DAILY   tiotropium (SPIRIVA) 18 MCG inhalation capsule Place 1 capsule (18 mcg total) into inhaler and inhale daily as needed.   torsemide (DEMADEX) 20 MG tablet Take 20 mg po daily in AM. Take one extra dose 20mg  PRN 3lb weight gain or abdominal fullness.   No facility-administered encounter medications on file as of 01/03/2021.    Patient Active Problem List   Diagnosis Date Noted   Normocytic anemia 12/21/2020   On amiodarone therapy 08/24/2020   Acute on chronic combined systolic and diastolic CHF (congestive heart failure) ([place]) 06/06/2020   PSVT (paroxysmal supraventricular tachycardia) (HCC)    AKI (acute kidney injury) ([place]) 05/26/2020   Aortic atherosclerosis ([place]) 05/25/2020   Acute on chronic respiratory failure with hypoxia ([place]) 05/24/2020   COPD exacerbation ([place]) 05/24/2020   Diabetes mellitus without complication ([place]) 02/58/5277  Acute CHF ([place]) 03/27/2020   Acute CHF (congestive heart failure) ([place]) 03/27/2020   Acute respiratory failure with hypoxia (HCC)    Diabetes mellitus type 2, uncomplicated ([place]) 07/86/7544   COPD, mild ([place]) 09/17/2014   Clinical depression 09/17/2014   Elevated platelet count 09/17/2014   Elevated erythrocyte sedimentation rate 09/17/2014   History of tobacco use 09/17/2014   Hypercholesteremia  09/17/2014   BP (high blood pressure) 09/17/2014   Hypothyroidism 09/17/2014   Cannot sleep 09/17/2014   Lumbar disc disease with radiculopathy 09/17/2014   Arthritis, degenerative 09/17/2014   Excess weight 09/17/2014   Vertebrobasilar circulation transient ischemic attack 09/17/2014   Lung nodule, multiple 09/17/2014   Basilar artery insufficiency 09/17/2014   Neuritis or radiculitis due to rupture of lumbar intervertebral disc 08/25/2013   Degenerative arthritis of lumbar spine 08/25/2013   Lumbar canal stenosis 08/25/2013   Smoker 07/15/2012   Back pain 07/15/2012   SOB (shortness of breath) 07/15/2012   Hyperlipidemia 07/15/2012   Essential hypertension 07/15/2012    Conditions to be addressed/monitored:COPD Patient Care Plan: COPD (Adult)     Problem Identified: Symptom Exacerbation (COPD)      Long-Range Goal: Symptom Exacerbation Prevented or Minimized   Start Date: 01/03/2021  Expected End Date: 04/03/2021  Priority: High  Note:   Current Barriers:  Chronic Disease Management support and educational needs r/t COPD.  Case Manager Clinical Goal(s): Over the next 90 days, patient will not require hospitalization or emergent care due to complications related to COPD.  Interventions:  Collaboration with Jerrol Banana., MD regarding development and update of comprehensive plan of care as evidenced by provider attestation and co-signature Inter-disciplinary care team collaboration (see longitudinal plan of care) Discussed plan since hospitalization on 12/21/20. She was hospitalized and treated for multifocal pneumonia on 12/21/20. Reports taking medications as prescribed. She will complete a post hospitalization visit with her PCP on 01/04/21. Agreed to bring her medications to her visit. Reports improvements today. Denies shortness of breath at rest. Will follow up within the month to discuss any changes in care management needs. Reviewed worsening s/sx that require  immediate medical attention.     Self-Care/Patient Goals: Self administer medications as prescribed Attend post hospital follow up as scheduled on 01/04/21 Assess symptoms daily and notify provider if not improving  Calls provider office for new concerns or questions   Follow Up Plan:  Will follow up within the next month       PLAN Will complete hospital follow up on 01/04/21. A member of the care management team will follow up within the next month.    [facility] Care [facility] [phone]

## 2021-01-04 ENCOUNTER — Ambulatory Visit (INDEPENDENT_AMBULATORY_CARE_PROVIDER_SITE_OTHER): Payer: Medicare Other | Admitting: Family Medicine

## 2021-01-04 ENCOUNTER — Other Ambulatory Visit: Payer: Self-pay

## 2021-01-04 VITALS — BP 112/73 | HR 81 | Temp 97.9°F

## 2021-01-04 DIAGNOSIS — Z23 Encounter for immunization: Secondary | ICD-10-CM | POA: Diagnosis not present

## 2021-01-04 DIAGNOSIS — R5383 Other fatigue: Secondary | ICD-10-CM | POA: Diagnosis not present

## 2021-01-04 DIAGNOSIS — J189 Pneumonia, unspecified organism: Secondary | ICD-10-CM | POA: Diagnosis not present

## 2021-01-04 DIAGNOSIS — I509 Heart failure, unspecified: Secondary | ICD-10-CM

## 2021-01-04 NOTE — Progress Notes (Signed)
Established patient visit   Patient: Kathryn [name]   DOB: Jun 06, 1933   87 y.o. Female  MRN: [id number] Visit Date: 01/04/2021  Today's healthcare provider: [name], MD   No chief complaint on file.  Subjective    HPI   Patient comes in today for follow-up of multifocal pneumonia.  Patient is frustrated because her energy level is slow to return.  Her breathing is fine.  No fevers and cough is markedly improved.    Admit date:     12/21/2020  Discharge date: 12/24/2020  Attending Physician: Edwin Dada [[id number]]  Discharge Physician: Edwin Dada    Discharge Diagnoses Principal Problem:   Acute on chronic combined systolic and diastolic CHF (congestive heart failure) ([place]) Active Problems:   COPD exacerbation ([place])   Hypothyroidism   Diabetes mellitus type 2, uncomplicated ([place])   Essential hypertension   PSVT (paroxysmal supraventricular tachycardia) (HCC)   Normocytic anemia   Recommendations at discharge:  Follow up with PCP Dr. Rosanna Randy in 1 week Follow up with Cardiology Dr. Rockey Situ as needed Dr. Rosanna Randy: Please obtain BMP in 1 week     Medications: Outpatient Medications Prior to Visit  Medication Sig   acetaminophen (TYLENOL) 500 MG tablet Take 1,000 mg by mouth every 6 (six) hours as needed. Pain    albuterol (PROVENTIL) (2.5 MG/3ML) 0.083% nebulizer solution Take 3 mLs (2.5 mg total) by nebulization every 6 (six) hours as needed for wheezing or shortness of breath.   amiodarone (PACERONE) 200 MG tablet TAKE 1 TABLET(200 MG) BY MOUTH DAILY AS DIRECTED   aspirin EC 81 MG tablet Take 1 tablet (81 mg total) by mouth daily. Swallow whole.   atorvastatin (LIPITOR) 40 MG tablet Take 1 tablet (40 mg total) by mouth daily.   carvedilol (COREG) 6.25 MG tablet Take 1 tablet (6.25 mg total) by mouth 2 (two) times daily with a meal.   dapagliflozin propanediol (FARXIGA) 10 MG TABS tablet Take 1 tablet (10 mg total) by mouth daily  before breakfast.   glucose blood (CONTOUR NEXT TEST) test strip Check sugar once daily  DX E11.9   hydrOXYzine (ATARAX/VISTARIL) 10 MG tablet TAKE 1 TO 2 TABLETS(10 TO 20 MG) BY MOUTH EVERY 6 HOURS AS NEEDED   levothyroxine (SYNTHROID) 100 MCG tablet TAKE 1 TABLET(100 MCG) BY MOUTH DAILY BEFORE BREAKFAST   losartan (COZAAR) 25 MG tablet Take 1 tablet (25 mg total) by mouth every evening.   metFORMIN (GLUCOPHAGE) 1000 MG tablet TAKE 1 TABLET(1000 MG) BY MOUTH DAILY WITH SUPPER   omeprazole (PRILOSEC) 20 MG capsule TAKE 1 CAPSULE(20 MG) BY MOUTH DAILY   tiotropium (SPIRIVA) 18 MCG inhalation capsule Place 1 capsule (18 mcg total) into inhaler and inhale daily as needed.   torsemide (DEMADEX) 20 MG tablet Take 20 mg po daily in AM. Take one extra dose 20mg  PRN 3lb weight gain or abdominal fullness.   No facility-administered medications prior to visit.    Review of Systems  Constitutional:  Positive for fatigue.  Respiratory:  Positive for cough (minimal hacking cough) and shortness of breath. Negative for wheezing.   Cardiovascular:  Positive for leg swelling. Negative for chest pain and palpitations.  Neurological:  Negative for dizziness and headaches.       Objective    There were no vitals taken for this visit. BP Readings from Last 3 Encounters:  01/13/21 120/60  01/12/21 112/70  01/04/21 112/73   Wt Readings from Last 3 Encounters:  01/13/21 175 lb (79.4 kg)  01/12/21 178 lb (80.7 kg)  12/24/20 173 lb 14.4 oz (78.9 kg)      Physical Exam Vitals and nursing note reviewed.  Constitutional:      Appearance: Normal appearance. She is normal weight.  HENT:     Right Ear: Tympanic membrane normal.     Left Ear: Tympanic membrane normal.     Nose: Nose normal.     Mouth/Throat:     Mouth: Mucous membranes are moist.     Pharynx: Oropharynx is clear.  Eyes:     General: No scleral icterus.    Conjunctiva/sclera: Conjunctivae normal.  Cardiovascular:     Rate and Rhythm:  Normal rate and regular rhythm.     Pulses: Normal pulses.     Heart sounds: Normal heart sounds.  Pulmonary:     Effort: Pulmonary effort is normal.     Breath sounds: Normal breath sounds.  Abdominal:     General: Bowel sounds are normal.     Palpations: Abdomen is soft.  Musculoskeletal:     Cervical back: Normal range of motion and neck supple.     Comments: Trace. pedal edema in ankles  Skin:    General: Skin is warm and dry.  Neurological:     Mental Status: She is alert.  Psychiatric:        Mood and Affect: Mood normal.        Behavior: Behavior normal.        Thought Content: Thought content normal.        Judgment: Judgment normal.      No results found for any visits on 01/04/21.  Assessment & Plan     1. Multifocal pneumonia Follow-up chest x-ray on next visit in 6-week  2. Pneumonia due to infectious organism, unspecified laterality, unspecified part of lung  - CBC with Differential/Platelet  3. Congestive heart failure, unspecified HF chronicity, unspecified heart failure type ([place]) Clinically stable. - CBC with Differential/Platelet - TSH - Renal Function Panel  4. Other fatigue Follow-up lab work but I think this is due to pneumonia.  She is improving. - CBC with Differential/Platelet - TSH - Renal Function Panel  5. Need for influenza vaccination  - Flu Vaccine QUAD High Dose(Fluad)   No follow-ups on file.      I, [name], MD, have reviewed all documentation for this visit. The documentation on 01/14/21 for the exam, diagnosis, procedures, and orders are all accurate and complete.    Rolene Andrades Cranford Mon, MD  [facility] [phone] (phone) [phone] (fax)  [place]

## 2021-01-10 ENCOUNTER — Encounter: Payer: Self-pay | Admitting: *Deleted

## 2021-01-11 DIAGNOSIS — J189 Pneumonia, unspecified organism: Secondary | ICD-10-CM | POA: Diagnosis not present

## 2021-01-11 DIAGNOSIS — I509 Heart failure, unspecified: Secondary | ICD-10-CM | POA: Diagnosis not present

## 2021-01-11 DIAGNOSIS — R5383 Other fatigue: Secondary | ICD-10-CM | POA: Diagnosis not present

## 2021-01-12 ENCOUNTER — Other Ambulatory Visit (HOSPITAL_COMMUNITY): Payer: Self-pay

## 2021-01-12 ENCOUNTER — Encounter (HOSPITAL_COMMUNITY): Payer: Self-pay

## 2021-01-12 LAB — CBC WITH DIFFERENTIAL/PLATELET
Basophils Absolute: 0.1 10*3/uL (ref 0.0–0.2)
Basos: 1 %
EOS (ABSOLUTE): 0.2 10*3/uL (ref 0.0–0.4)
Eos: 4 %
Hematocrit: 37.1 % (ref 34.0–46.6)
Hemoglobin: 11.2 g/dL (ref 11.1–15.9)
Immature Grans (Abs): 0 10*3/uL (ref 0.0–0.1)
Immature Granulocytes: 1 %
Lymphocytes Absolute: 1.5 10*3/uL (ref 0.7–3.1)
Lymphs: 26 %
MCH: 25.1 pg — ABNORMAL LOW (ref 26.6–33.0)
MCHC: 30.2 g/dL — ABNORMAL LOW (ref 31.5–35.7)
MCV: 83 fL (ref 79–97)
Monocytes Absolute: 0.5 10*3/uL (ref 0.1–0.9)
Monocytes: 9 %
Neutrophils Absolute: 3.3 10*3/uL (ref 1.4–7.0)
Neutrophils: 59 %
Platelets: 341 10*3/uL (ref 150–450)
RBC: 4.46 x10E6/uL (ref 3.77–5.28)
RDW: 15.7 % — ABNORMAL HIGH (ref 11.7–15.4)
WBC: 5.7 10*3/uL (ref 3.4–10.8)

## 2021-01-12 LAB — RENAL FUNCTION PANEL
Albumin: 4.5 g/dL (ref 3.6–4.6)
BUN/Creatinine Ratio: 18 (ref 12–28)
BUN: 18 mg/dL (ref 8–27)
CO2: 24 mmol/L (ref 20–29)
Calcium: 10 mg/dL (ref 8.7–10.3)
Chloride: 102 mmol/L (ref 96–106)
Creatinine, Ser: 1 mg/dL (ref 0.57–1.00)
Glucose: 161 mg/dL — ABNORMAL HIGH (ref 70–99)
Phosphorus: 4 mg/dL (ref 3.0–4.3)
Potassium: 5.2 mmol/L (ref 3.5–5.2)
Sodium: 141 mmol/L (ref 134–144)
eGFR: 55 mL/min/{1.73_m2} — ABNORMAL LOW (ref >59)

## 2021-01-12 LAB — TSH: TSH: 5.98 u[IU]/mL — ABNORMAL HIGH (ref 0.450–4.500)

## 2021-01-12 NOTE — Progress Notes (Signed)
Today had a home visit with Aiko.  She states tired today but still able to do some things around the home then she is going to rest.  She states felt real good yesterday but maybe did too much.  She is aware of cardiology appt tomorrow.  She wants to know why in the hospital her legs are skinny and when she comes home and in a few days they are puffy.  Advised her in the hospital she is laying in the bed and here she has them down most of the day.  Explained the importance of elevated her legs when sitting.  She does have socks on today but ankle are puffy, legs have slight edema in them.  She states watching high sodium foods nad how much fluids she is drinking.  Lungs has little rhonchi in right lower, rest sounds clear.  She denies any shortness of breath at rest and some with over exerting her self.  She states mostly she has pain from standing and walking.  Her shoulder is still hurting also, she states both shoulders needs replacements.  She lives alone in an apartment attached to her son.  She has good family support.  She is aware of how to take her meds.  She has all her meds.  She has not been taking her Spiriva, explained importance of it and she states will start.  She is taking her blood sugar daily and doing her neb treatments.  She cooks for herself, but she states her appetite is not been good.  Abdomen is fuller than her normal.  She states weight has been staying within 2 lbs.  She weighs daily.  She has cardiology appt tomorrow, will watch for results of visit.  She denies chest pain, headaches and dizziness.  She is wanting to do more but she states her body will not allow it.  Mood is good.  Will continue to visit for heart failure, diet and medication compliance.   [place] [phone]

## 2021-01-13 ENCOUNTER — Other Ambulatory Visit: Payer: Self-pay

## 2021-01-13 ENCOUNTER — Encounter: Payer: Self-pay | Admitting: Physician Assistant

## 2021-01-13 ENCOUNTER — Ambulatory Visit (INDEPENDENT_AMBULATORY_CARE_PROVIDER_SITE_OTHER): Payer: Medicare Other | Admitting: Physician Assistant

## 2021-01-13 VITALS — BP 120/60 | HR 68 | Ht 64.0 in | Wt 175.0 lb

## 2021-01-13 DIAGNOSIS — I447 Left bundle-branch block, unspecified: Secondary | ICD-10-CM | POA: Diagnosis not present

## 2021-01-13 DIAGNOSIS — E119 Type 2 diabetes mellitus without complications: Secondary | ICD-10-CM | POA: Diagnosis not present

## 2021-01-13 DIAGNOSIS — Q248 Other specified congenital malformations of heart: Secondary | ICD-10-CM

## 2021-01-13 DIAGNOSIS — I428 Other cardiomyopathies: Secondary | ICD-10-CM

## 2021-01-13 DIAGNOSIS — I472 Ventricular tachycardia, unspecified: Secondary | ICD-10-CM

## 2021-01-13 DIAGNOSIS — I5023 Acute on chronic systolic (congestive) heart failure: Secondary | ICD-10-CM

## 2021-01-13 DIAGNOSIS — E785 Hyperlipidemia, unspecified: Secondary | ICD-10-CM

## 2021-01-13 DIAGNOSIS — I5022 Chronic systolic (congestive) heart failure: Secondary | ICD-10-CM

## 2021-01-13 DIAGNOSIS — E039 Hypothyroidism, unspecified: Secondary | ICD-10-CM

## 2021-01-13 DIAGNOSIS — I10 Essential (primary) hypertension: Secondary | ICD-10-CM | POA: Diagnosis not present

## 2021-01-13 DIAGNOSIS — I471 Supraventricular tachycardia: Secondary | ICD-10-CM

## 2021-01-13 DIAGNOSIS — Z79899 Other long term (current) drug therapy: Secondary | ICD-10-CM

## 2021-01-13 NOTE — Progress Notes (Signed)
Office Visit    Patient Name: Kathryn [name] Date of Encounter: 01/13/2021  PCP:  Jerrol Banana., MD   [facility]  Cardiologist:  Ida Rogue, MD  Advanced Practice Provider:  No care team member to display Electrophysiologist:  Vickie Epley, MD   Chief Complaint    Chief Complaint  Patient presents with   Other    Hosp. F/u CHF/Tachy c/o sob with exertion and edema ankles. Meds reviewed verbally with pt.    87 y.o. female   with history of HFrEF (EF 40 to 45%, 03/2020), hypertension, LBBB, PSVT, DM2,  severe COPD, thyroid dz, prior smoking history x50 years, HTN, HLD, PVD, gastric ulcer, and seen today for follow-up.  Past Medical History    Past Medical History:  Diagnosis Date   Actinic keratosis    Aortic atherosclerosis ([place]) 05/25/2020   Arthritis    spinal stenosis   Cardiomyopathy - presumed to be nonischemic    a. 03/2020 Echo: EF 40-45%, gr2 DD. Nl RV fxn. Mildly dil LA. Mild MR/ao sclerosis; b. 03/2020 MV: EF 30-44%, no ischemia. Cor Ca2+ in pLAD.   CHF (congestive heart failure) (HCC)    COPD (chronic obstructive pulmonary disease) (HCC)    Coronary artery calcification seen on CT scan    a. 03/2020 MV: CT attenuation images show Ao and mod pLAD Ca2+.   Diabetes mellitus without complication ([place])    Gastric ulcer 4/12   treated with Prolisec- states no problems now   Hyperlipidemia    Hypertension    PCP Dr Miguel Aschoff   [place]   Hypothyroidism    Left bundle branch block (LBBB)    Neuromuscular disorder (HCC)    slight numbness right toes- comes and goes   Peripheral vascular disease (HCC)    varicose veins left leg   Pneumonia    PSVT (paroxysmal supraventricular tachycardia) ([place])    a. 03/2020 Zio: Avg HR 87 (66-211).  4 beats NSVT.  43 PSVT episodes, longest 10h 68m @ avg of 153 bpm (max 211).  Seen by EP-->amio started (pt wished to avoid EPS/RFCA).   Seasonal allergies    Shortness of breath     Skin cancer    multiple from face   Squamous cell carcinoma of skin 03/23/2013   L dorsal hand   Squamous cell carcinoma of skin 02/04/2014   R forearm/in situ, L pretibial   Squamous cell carcinoma of skin 09/24/2018   R thumb   Past Surgical History:  Procedure Laterality Date   BACK SURGERY     4/12  Dr Shellia Carwin- for lumbar stenosis   BREAST CYST ASPIRATION Left 1965   negative   [place]   lumpectomy with biopsy   left   CARPAL TUNNEL RELEASE     right   COLONOSCOPY     COLONOSCOPY WITH PROPOFOL N/A 06/30/2015   Procedure: COLONOSCOPY WITH PROPOFOL;  Surgeon: Hulen Luster, MD;  Location: [facility] ENDOSCOPY;  Service: Gastroenterology;  Laterality: N/A;   COLONOSCOPY WITH PROPOFOL N/A 01/22/2017   Procedure: COLONOSCOPY WITH PROPOFOL;  Surgeon: Lollie Sails, MD;  Location: [facility] ENDOSCOPY;  Service: Endoscopy;  Laterality: N/A;   COLONOSCOPY WITH PROPOFOL N/A 01/28/2019   Procedure: COLONOSCOPY WITH PROPOFOL;  Surgeon: Toledo, Benay Pike, MD;  Location: [facility] ENDOSCOPY;  Service: Gastroenterology;  Laterality: N/A;   ESOPHAGOGASTRODUODENOSCOPY     ESOPHAGOGASTRODUODENOSCOPY (EGD) WITH PROPOFOL N/A 01/28/2019   Procedure: ESOPHAGOGASTRODUODENOSCOPY (EGD) WITH PROPOFOL;  Surgeon: Toledo, Benay Pike, MD;  Location: [facility] ENDOSCOPY;  Service: Gastroenterology;  Laterality: N/A;   EYE SURGERY     cataract extraction with IOL bilaterally   JOINT REPLACEMENT     LUMBAR LAMINECTOMY/DECOMPRESSION MICRODISCECTOMY  05/24/2011   Procedure: LUMBAR LAMINECTOMY/DECOMPRESSION MICRODISCECTOMY 2 LEVELS;  Surgeon: Magnus Sinning, MD;  Location: [facility] ORS;  Service: Orthopedics;  Laterality: N/A;  L2-L3, L3-L4 (x-ray)   RIGHT OOPHORECTOMY     due to STAPH INFECTION    Allergies  Allergies  Allergen Reactions   Oxycodone Other (See Comments)    Mental status change   Prednisone     swelling, bruising.    History of Present Illness    Kathryn [name] is a 87 y.o. female with PMH as  above.  She underwent MPI 06/2012 that was ruled low risk.  She has history of 2014 carotid study without evidence of hemodynamically significant carotid stenosis.  She has a history of recently diagnosed HFrEF, PSVT, hypertension, COPD, prior smoking history, medication noncompliance.  She notes a family history of CAD, including that of her son.  She was seen in the hospital 03/27/2020 for SOB and LEE and placed on  BiPAP and IV Lasix started, as well as DuoNeb, steroids, and Nitropaste.  EF 40 to 45% with LBBB of unknown duration.   Short runs of atrial fibrillation were noted on telemetry with overall burden low.  She was successfully IV diuresed and switched to furosemide 20 mg daily with ARB and BB. Recommendation was for further ischemic workup as an OP for reduced EF. She was discharged with cardiac monitor with [facility] deferred.  04/15/2020 MPI as below and overall ruled intermediate risk.  Statin increased and pt started on ASA. 04/20/2020 Zio as below with recommendations to increase Coreg and EP referral provided.  She was seen by EP 04/2020 for symptomatic PSVT.  Options for treating SVT were discussed, including addition of antiarrhythmic therapy versus EP study and ablation.  She was not interested in ablation.  She wanted to try medical therapy.  Given her age and medical comorbidities and presence of left bundle branch block on EKG, she was started on amiodarone.  She was loaded with amiodarone with recommendation to see the patient back in 3 months for repeat labs and CMP, TSH, free T4.  With recurrent episodes of SVT, EP study with ablation was recommended.    Admitted 3/1 and 06/06/20 due to HF/ COPD exacerbation and IV diuresed with improvement in sx.  She was admitted 12/21/2020 for multifocal pneumonia.  Today, 01/13/2021, she returns to clinic and notes that she feels more short of breath compared with previous clinic visits.  She feels short of breath at rest and with exertion. She has had a  hacking cough ever since she left the hospital for her pna admission.  Her cough and shortness of breath then became worse last night. She feels short of breath at the time of her visit today.  She has abdominal distention and early satiety, though feels her LEE improved from admission.  This morning, she noticed her weight was up 3 to 4 pounds from yesterday.  She noticed her weight often increases 2 pounds between each day (which could mean increasing 10-14lbs in a week but not the 3 to 4 pounds overnight), so she does not take her extra Lasix.  We discussed taking her Lasix if 5 pound increase over a week.    No chest pain.   No presyncope or syncope.  No signs or symptoms of bleeding.  She is considerably fatigued, though notes it may be slightly better since discharge from the hospital for her pneumonia.  She drinks 1 to 2 cups of coffee per day.  Yesterday, she had a pitcher of tea.  There are days where she is very thirsty and feels as if she is drinking all the time.  She states she does not eat as much prepackaged food is in the past but does admit to many prepackaged veggies.  She reports low salt, though on review of diet yesterday, she ate one half of a ham sandwich, vegetable soup yesterday, mixed greens, and a brownie.  We discussed her salt intake and low salt fluids. Wt 169-173lbs.   Home Medications   Current Outpatient Medications  Medication Instructions   acetaminophen (TYLENOL) 1,000 mg, Every 6 hours PRN   albuterol (PROVENTIL) 2.5 mg, Nebulization, Every 6 hours PRN   amiodarone (PACERONE) 200 MG tablet TAKE 1 TABLET(200 MG) BY MOUTH DAILY AS DIRECTED   aspirin EC 81 mg, Oral, Daily, Swallow whole.   atorvastatin (LIPITOR) 40 mg, Oral, Daily   carvedilol (COREG) 6.25 mg, Oral, 2 times daily with meals   dapagliflozin propanediol (FARXIGA) 10 mg, Oral, Daily before breakfast   glucose blood (CONTOUR NEXT TEST) test strip Check sugar once daily  DX E11.9   hydrOXYzine  (ATARAX/VISTARIL) 10 MG tablet TAKE 1 TO 2 TABLETS(10 TO 20 MG) BY MOUTH EVERY 6 HOURS AS NEEDED   levothyroxine (SYNTHROID) 100 MCG tablet TAKE 1 TABLET(100 MCG) BY MOUTH DAILY BEFORE BREAKFAST   losartan (COZAAR) 25 mg, Oral, Every evening   metFORMIN (GLUCOPHAGE) 1000 MG tablet TAKE 1 TABLET(1000 MG) BY MOUTH DAILY WITH SUPPER   omeprazole (PRILOSEC) 20 MG capsule TAKE 1 CAPSULE(20 MG) BY MOUTH DAILY   tiotropium (SPIRIVA) 18 mcg, Inhalation, Daily PRN   torsemide (DEMADEX) 20 MG tablet Take 20 mg po daily in AM. Take one extra dose 20mg  PRN 3lb weight gain or abdominal fullness.     Review of Systems    She reports shortness of breath, dyspnea, cough, weight gain, early satiety.  She feels her lower extremity edema is improved from admission. She denies chest pain, palpitations, dizziness, syncope.   All other systems reviewed and are otherwise negative except as noted above.  Physical Exam    VS:  BP 120/60 (BP Location: Left Arm, Patient Position: Sitting, Cuff Size: Normal)   Pulse 68   Ht 5\' 4"  (1.626 m)   Wt 175 lb (79.4 kg)   SpO2 91%   BMI 30.04 kg/m  , BMI Body mass index is 30.04 kg/m. GEN: Elderly female, in no acute distress.  Joined by her daughter. HEENT: normal. Neck: Supple, JVP approximately 10 to 11 cm.  No carotid bruits, or masses. Cardiac: RRR, 2/6 systolic murmur. No rubs, or gallops. No clubbing, cyanosis.  Moderate to 1+ bilateral lower extremity edema.  Radials/DP/PT 2+ and equal bilaterally.  Respiratory: Bilateral crackles all the way up to the mid lung. GI: Soft, nontender, nondistended, BS + x 4. MS: no deformity or atrophy. Skin: warm and dry, no rash. Neuro:  Strength and sensation are intact. Psych: Normal affect.  Accessory Clinical Findings    ECG personally reviewed by me today -NSR, LBBB, first-degree AV block QTC improved to 499 ms- no acute changes.  VITALS Reviewed today   Temp Readings from Last 3 Encounters:  01/04/21 97.9 F (36.6  C) (Oral)  12/24/20 98.7 F (37.1 C)  10/03/20 98.2 F (36.8 C)   BP Readings from Last 3 Encounters:  01/13/21 120/60  01/12/21 112/70  01/04/21 112/73   Pulse Readings from Last 3 Encounters:  01/13/21 68  01/12/21 74  01/04/21 81    Wt Readings from Last 3 Encounters:  01/13/21 175 lb (79.4 kg)  01/12/21 178 lb (80.7 kg)  12/24/20 173 lb 14.4 oz (78.9 kg)     LABS  reviewed today   Lab Results  Component Value Date   WBC 5.7 01/11/2021   HGB 11.2 01/11/2021   HCT 37.1 01/11/2021   MCV 83 01/11/2021   PLT 341 01/11/2021   Lab Results  Component Value Date   CREATININE 1.00 01/11/2021   BUN 18 01/11/2021   NA 141 01/11/2021   K 5.2 01/11/2021   CL 102 01/11/2021   CO2 24 01/11/2021   Lab Results  Component Value Date   ALT 13 12/21/2020   AST 15 12/21/2020   ALKPHOS 84 12/21/2020   BILITOT 0.6 12/21/2020   Lab Results  Component Value Date   CHOL 177 10/03/2020   HDL 55 10/03/2020   LDLCALC 83 10/03/2020   TRIG 239 (H) 10/03/2020   CHOLHDL 3.2 10/03/2020    Lab Results  Component Value Date   HGBA1C 7.8 (H) 12/23/2020   Lab Results  Component Value Date   TSH 5.980 (H) 01/11/2021     STUDIES/PROCEDURES reviewed today   Zio 04/20/20 Event monitor Patch Wear Time:  13 days and 22 hours    Normal sinus rhythm Patient had a min HR of 66 bpm, max HR of 211 bpm, and avg HR of 87 bpm.     1 run of Ventricular Tachycardia occurred lasting 4 beats with a max rate of 185 bpm (avg 168 bpm).    43 Supraventricular Tachycardia  runs occurred, the run with the fastest interval lasting 10 hours 35 mins with a max rate of 211 bpm (avg 153 bpm); the run with the fastest interval was also the longest.   Idioventricular Rhythm was present. Isolated SVEs were rare (<1.0%), SVE Couplets  were rare (<1.0%), and SVE Triplets were rare (<1.0%).  Isolated VEs were rare (<1.0%, 2912), VE Couplets were rare (<1.0%, 23), and VE Triplets were rare (<1.0%, 7).   Ventricular Bigeminy and Trigeminy were present.    Patient triggered events (shortness of breath, fluttering/racing)  were not associated with significant arrhythmia  MPI 04/15/20 ST segment depression was noted during stress in the aVF, II and III leads. EKG is not interpretable due to underlying left bundle branch block. This is an intermediate risk study due to reduced ejection fraction. The left ventricular ejection fraction is moderately decreased (30-44%). No significant perfusion defects are noted. Possible nonischemic cardiomyopathy. CT attenuation images shows aortic calcifications and moderate coronary calcifications mostly in the proximal LAD distribution  Echo 03/28/2020  1. Left ventricular ejection fraction, by estimation, is 40 to 45%. The  left ventricle has mildly decreased function. Left ventricular endocardial  border not optimally defined to evaluate regional wall motion. Left  ventricular diastolic parameters are  consistent with Grade II diastolic dysfunction (pseudonormalization). The  global longitudinal strain is abnormal.   2. Right ventricular systolic function is normal. The right ventricular  size is normal. Tricuspid regurgitation signal is inadequate for assessing  PA pressure.   3. Left atrial size was mildly dilated.   4. The mitral valve is abnormal. Mild mitral valve regurgitation. No  evidence of mitral stenosis.  5. The aortic valve is normal in structure. Aortic valve regurgitation is  mild. Mild aortic valve sclerosis is present, with no evidence of aortic  valve stenosis.   MPI 2014 Documented as low risk.   05/2010 Carotids IMPRESSION: no evidence of hemodynamically significant carotid  stenosis.    Assessment & Plan    Acute on chronic heart failure with reduced ejection fraction --Reports sx consistent with overload, likely multifactorial in the setting of recent admission for pna. Volume up on exam with diuresis increased as below  today.  EF 40 to 45%, G2DD, mild LAE. Given LBBB and reduced EF, MPI performed as OP and ruled intermediate risk. Further workup deferred given lack of anginal sx at subsequent visits. Increase to torsemide 40 mg daily for 4 days After 4 days, drop back to torsemide 20 mg daily  with an extra torsemide as needed for wt gain 3 lbs overnight / 5 lbs in 1 week.  BMET, BNP today and at RTC. Continue Farxiga, losartan, Coreg.   Escalate GDMT as tolerated at RTC CHF education   Left Bundle Branch Block, known Intermediate MPI --Reports DOE with previous concern for anginal equivalent given reduced EF and LBBB at previous admissions. MPI intermediate risk. If ongoing sx s/p diuresis and recovery from pna,  consider further ischemic workup with R/LHC at that time. Defer for now. Continue medical management and aggressive RF modification. Continue ASA, BB, statin.     PSVT --As above - seen by EP after her monitor and started on amiodarone. Continue with repeat labs today including BMET, TSH, FT4 to monitor her on amiodarone.  Follow-up with EP as recommended.   Hypertension, goal BP less than 130/80 --Continue current medications, including Coreg and Losartan.   HLD, goal LDL <70 --Continue statin.    Mild MR, aortic sclerosis --Recommend periodic echo to monitor.   Hypothyroidism -- Will recheck TSH/FT4 on amiodarone.  Continue current Synthroid.  Further management per PCP.   DM2 --Glycemic control recommended for risk factor modification. Continue Wilder Glade and current metformin.  Further management per PCP.   COPD, history of tobacco use --Continue current inhalers. Caution with albuterol, given this will likely exacerbate any elevated rates.  Caution with steroids.  Further management per PCP.    *Please be aware that the above documentation was completed voice recognition software and may contain dictation errors.     Arvil Chaco, PA-C 01/13/2021

## 2021-01-13 NOTE — Patient Instructions (Addendum)
Medication Instructions:  - Your physician has recommended you make the following change in your medication:   1) INCREASE Demadex (Torsemide) 20 mg: - take 2 tablets (40 mg) by mouth once daily x 4 days, then - resume 1 tablet (20 mg) by mouth once daily  2) Call the office/ send a MyChart message on Monday 10/24 to let Jacquelyn know how you are doing  *If you need a refill on your cardiac medications before your next appointment, please call your pharmacy*   Lab Work: - Your physician recommends that you have lab work today: BMP/ BNP/ TSH/ Free T4  If you have labs (blood work) drawn today and your tests are completely normal, you will receive your results only by: [place] (if you have [place]) OR A paper copy in the mail If you have any lab test that is abnormal or we need to change your treatment, we will call you to review the results.   Testing/Procedures: - none ordered   Follow-Up: At [facility], you and your health needs are our priority.  As part of our continuing mission to provide you with exceptional heart care, we have created designated Provider Care Teams.  These Care Teams include your primary Cardiologist (physician) and Advanced Practice Providers (APPs -  Physician Assistants and Nurse Practitioners) who all work together to provide you with the care you need, when you need it.  We recommend signing up for the patient portal called "MyChart".  Sign up information is provided on this After Visit Summary.  MyChart is used to connect with patients for Virtual Visits (Telemedicine).  Patients are able to view lab/test results, encounter notes, upcoming appointments, etc.  Non-urgent messages can be sent to your provider as well.   To learn more about what you can do with MyChart, go to [email or web address].    Your next appointment:   1 week(s)  The format for your next appointment:   In Person  Provider:   Marrianne Mood, PA-C   Other  Instructions N/a

## 2021-01-14 LAB — BASIC METABOLIC PANEL
BUN/Creatinine Ratio: 19 (ref 12–28)
BUN: 19 mg/dL (ref 8–27)
CO2: 22 mmol/L (ref 20–29)
Calcium: 9.7 mg/dL (ref 8.7–10.3)
Chloride: 100 mmol/L (ref 96–106)
Creatinine, Ser: 1.01 mg/dL — ABNORMAL HIGH (ref 0.57–1.00)
Glucose: 152 mg/dL — ABNORMAL HIGH (ref 70–99)
Potassium: 5 mmol/L (ref 3.5–5.2)
Sodium: 138 mmol/L (ref 134–144)
eGFR: 55 mL/min/{1.73_m2} — ABNORMAL LOW (ref >59)

## 2021-01-14 LAB — TSH: TSH: 5.93 u[IU]/mL — ABNORMAL HIGH (ref 0.450–4.500)

## 2021-01-14 LAB — BRAIN NATRIURETIC PEPTIDE: BNP: 217.6 pg/mL — ABNORMAL HIGH (ref 0.0–100.0)

## 2021-01-14 LAB — T4, FREE: Free T4: 1.64 ng/dL (ref 0.82–1.77)

## 2021-01-15 ENCOUNTER — Encounter: Payer: Self-pay | Admitting: Physician Assistant

## 2021-01-16 ENCOUNTER — Other Ambulatory Visit: Payer: Self-pay | Admitting: Family Medicine

## 2021-01-16 DIAGNOSIS — E119 Type 2 diabetes mellitus without complications: Secondary | ICD-10-CM

## 2021-01-17 ENCOUNTER — Telehealth: Payer: Self-pay | Admitting: Physician Assistant

## 2021-01-17 NOTE — Telephone Encounter (Signed)
Pt seen in office by Marrianne Mood, PA 01/13/21.  At visit pt was to:   1) INCREASE Demadex (Torsemide) 20 mg: - take 2 tablets (40 mg) by mouth once daily x 4 days, then - resume 1 tablet (20 mg) by mouth once daily   2) Call the office/ send a MyChart message on Monday 10/24 to let Jacquelyn know how you are doing  Below pt reports incr in her Torsemide has caused her swelling in legs to decrease.  Attempted to call pt back to discuss further to ensure no further questions or concerns.  LM stating pt may call back if needs, otherwise will make Elenor Quinones aware.  Pt will follow up 01/20/21.

## 2021-01-17 NOTE — Telephone Encounter (Signed)
Patient is calling to report the increase in her Lasix has caused her swelling in her legs to decrease.

## 2021-01-20 ENCOUNTER — Encounter: Payer: Self-pay | Admitting: Physician Assistant

## 2021-01-20 ENCOUNTER — Other Ambulatory Visit: Payer: Self-pay

## 2021-01-20 ENCOUNTER — Ambulatory Visit (INDEPENDENT_AMBULATORY_CARE_PROVIDER_SITE_OTHER): Payer: Medicare Other | Admitting: Physician Assistant

## 2021-01-20 VITALS — BP 130/64 | HR 66 | Ht 64.0 in | Wt 172.0 lb

## 2021-01-20 DIAGNOSIS — I5021 Acute systolic (congestive) heart failure: Secondary | ICD-10-CM | POA: Diagnosis not present

## 2021-01-20 DIAGNOSIS — E785 Hyperlipidemia, unspecified: Secondary | ICD-10-CM

## 2021-01-20 DIAGNOSIS — I471 Supraventricular tachycardia: Secondary | ICD-10-CM

## 2021-01-20 DIAGNOSIS — Q248 Other specified congenital malformations of heart: Secondary | ICD-10-CM | POA: Diagnosis not present

## 2021-01-20 DIAGNOSIS — J449 Chronic obstructive pulmonary disease, unspecified: Secondary | ICD-10-CM

## 2021-01-20 DIAGNOSIS — I447 Left bundle-branch block, unspecified: Secondary | ICD-10-CM

## 2021-01-20 DIAGNOSIS — I10 Essential (primary) hypertension: Secondary | ICD-10-CM | POA: Diagnosis not present

## 2021-01-20 DIAGNOSIS — E039 Hypothyroidism, unspecified: Secondary | ICD-10-CM

## 2021-01-20 DIAGNOSIS — Z79899 Other long term (current) drug therapy: Secondary | ICD-10-CM

## 2021-01-20 MED ORDER — TORSEMIDE 40 MG PO TABS: 40.0000 mg | ORAL_TABLET | Freq: Every day | ORAL | 6 refills | Status: DC

## 2021-01-20 NOTE — Patient Instructions (Addendum)
Medication Instructions:  - Your physician has recommended you make the following change in your medication:   1) INCREASE torsemide to 40 mg- take 1 tablet by mouth once daily  *If you need a refill on your cardiac medications before your next appointment, please call your pharmacy*   Lab Work: - Your physician recommends that you return for lab work in 1 month: BMP/ BNP (can repeat at your next office visit)  If you have labs (blood work) drawn today and your tests are completely normal, you will receive your results only by: [place] (if you have MyChart) OR A paper copy in the mail If you have any lab test that is abnormal or we need to change your treatment, we will call you to review the results.   Testing/Procedures: - none ordered   Follow-Up: At [facility], you and your health needs are our priority.  As part of our continuing mission to provide you with exceptional heart care, we have created designated Provider Care Teams.  These Care Teams include your primary Cardiologist (physician) and Advanced Practice Providers (APPs -  Physician Assistants and Nurse Practitioners) who all work together to provide you with the care you need, when you need it.  We recommend signing up for the patient portal called "MyChart".  Sign up information is provided on this After Visit Summary.  MyChart is used to connect with patients for Virtual Visits (Telemedicine).  Patients are able to view lab/test results, encounter notes, upcoming appointments, etc.  Non-urgent messages can be sent to your provider as well.   To learn more about what you can do with MyChart, go to [email or web address].    Your next appointment:   1 month(s)  The format for your next appointment:   In Person  Provider:   You may see Ida Rogue, MD or one of the following Advanced Practice Providers on your designated Care Team:   Murray Hodgkins, NP Christell Faith, PA-C Marrianne Mood,  PA-C Cadence Kathlen Mody, [place]   Other Instructions  [facility] (Torsemide) Oral Tablets What is this medication? TORSEMIDE (TORE se mide) is a diuretic. It helps you make more urine and lose salt and water from your body. It treats swelling from heart, kidney, or liver disease. It also treats high blood pressure. This medicine may be used for other purposes; ask your health care provider or pharmacist if you have questions. COMMON BRAND NAME(S): Demadex, SOAANZ What should I tell my care team before I take this medication? They need to know if you have any of these conditions: high or low levels of electrolytes, like magnesium, potassium, and sodium, in your blood diabetes gout kidney disease liver disease an unusual or allergic reaction to torsemide, povidone, other medicines, foods, dyes, or preservatives pregnant or trying to get pregnant breast-feeding How should I use this medication? Take this drug by mouth with water. Take it as directed on the prescription label at the same time every day. Keep taking it unless your health care provider tells you to stop. Talk to your health care provider about the use of this drug in children. Special care may be needed. Overdosage: If you think you have taken too much of this medicine contact a poison control center or emergency room at once. NOTE: This medicine is only for you. Do not share this medicine with others. What if I miss a dose? If you miss a dose, take it as soon as you can. If it is almost time for your  next dose, take only that dose. Do not take double or extra doses. What may interact with this medication? alcohol aspirin and aspirin-like medicines celecoxib certain medicines for blood pressure, heart disease, irregular heartbeat certain medicines for cholesterol like cholestyramine certain medicines for diabetes cisplatin cyclosporine ephedra ginseng lithium medicines for infection like acyclovir, adefovir, amphotericin B,  bacitracin, cidofovir, foscarnet, ganciclovir, gentamicin, pentamidine, vancomycin medicines that relax muscles for surgery NSAIDs, medicines for pain and inflammation, like ibuprofen or naproxen other diuretics pamidronate probenecid rifampin steroid medicines like prednisone or cortisone warfarin zoledronic acid This list may not describe all possible interactions. Give your health care provider a list of all the medicines, herbs, non-prescription drugs, or dietary supplements you use. Also tell them if you smoke, drink alcohol, or use illegal drugs. Some items may interact with your medicine. What should I watch for while using this medication? Visit your health care provider for regular checks on your progress. Tell your health care provider if your symptoms do not start to get better or if they get worse. Check your blood pressure regularly. Ask your health care provider what your blood pressure should be. Also, find out when you should contact him or her. You may need blood work done while you are taking this drug. Do not treat yourself for coughs, colds, or pain while using this drug without asking your health care provider for advice. Some drugs may increase your blood pressure. This drug may increase blood sugar. Ask your health care provider if changes in diet or drugs are needed if you have diabetes. You may need to be on a special diet while you are taking this drug. Ask your health care provider. Also, find out how many glasses of fluids you need to drink each day. Check with your health care provider if you have severe diarrhea, nausea, and vomiting, or if you sweat a lot. The loss of too much body fluid may make it dangerous for you to take this drug. You may get drowsy or dizzy. Do not drive, use machinery, or do anything that needs mental alertness until you know how this drug affects you. Do not stand or sit up quickly, especially if you are an older patient. This reduces the risk  of dizzy or fainting spells. Alcohol may interfere with the effects of this drug. Avoid alcoholic drinks. What side effects may I notice from receiving this medication? Side effects that you should report to your doctor or health care professional as soon as possible: allergic reactions (skin rash, itching or hives; swelling of the face, lips, or tongue) decreased hearing, ringing in the ears electrolyte imbalance (increased thirst; loss of appetite; severe diarrhea; unusual sweating; vomiting) kidney injury (trouble passing urine or change in the amount of urine) low potassium (trouble breathing, chest pain; dizziness; fast, irregular heartbeat; feeling faints or lightheaded, falls; muscle cramps or pain) Side effects that usually do not require medical attention (report to your doctor or health care professional if they continue or are bothersome): passing large amounts of urine stomach pain This list may not describe all possible side effects. Call your doctor for medical advice about side effects. You may report side effects to FDA at 1-800-FDA-1088. Where should I keep my medication? Keep out of the reach of children and pets. Store at room temperature between 15 and 30 degrees C (59 and 86 degrees F). Do not freeze. Throw away any unused drug after the expiration date. NOTE: This sheet is a summary. It may not  cover all possible information. If you have questions about this medicine, talk to your doctor, pharmacist, or health care provider.  2022 Elsevier/Gold Standard (2018-11-27 19:52:40)

## 2021-01-20 NOTE — Progress Notes (Signed)
Office Visit    Patient Name: Kathryn [name] Date of Encounter: 01/20/2021  PCP:  Jerrol Banana., MD   [facility]  Cardiologist:  Ida Rogue, MD  Advanced Practice Provider:  No care team member to display Electrophysiologist:  Vickie Epley, MD   Chief Complaint    Chief Complaint  Patient presents with   Follow-up    1 Week follow up and patient co bilateral ankle swelling since going back to original dose of Torsemide. Medications verbally reviewed with patient.     87 y.o. female   with history of HFrEF (EF 40 to 45%, 03/2020), hypertension, LBBB, PSVT, DM2,  severe COPD, thyroid dz, prior smoking history x50 years, HTN, HLD, PVD, gastric ulcer, and seen today for 1 week follow-up.  Past Medical History    Past Medical History:  Diagnosis Date   Actinic keratosis    Aortic atherosclerosis ([place]) 05/25/2020   Arthritis    spinal stenosis   Cardiomyopathy - presumed to be nonischemic    a. 03/2020 Echo: EF 40-45%, gr2 DD. Nl RV fxn. Mildly dil LA. Mild MR/ao sclerosis; b. 03/2020 MV: EF 30-44%, no ischemia. Cor Ca2+ in pLAD.   CHF (congestive heart failure) (HCC)    COPD (chronic obstructive pulmonary disease) (HCC)    Coronary artery calcification seen on CT scan    a. 03/2020 MV: CT attenuation images show Ao and mod pLAD Ca2+.   Diabetes mellitus without complication ([place])    Gastric ulcer 4/12   treated with Prolisec- states no problems now   Hyperlipidemia    Hypertension    PCP Dr Miguel Aschoff   [place]   Hypothyroidism    Left bundle branch block (LBBB)    Neuromuscular disorder (HCC)    slight numbness right toes- comes and goes   Peripheral vascular disease (HCC)    varicose veins left leg   Pneumonia    PSVT (paroxysmal supraventricular tachycardia) ([place])    a. 03/2020 Zio: Avg HR 87 (66-211).  4 beats NSVT.  43 PSVT episodes, longest 10h 31m @ avg of 153 bpm (max 211).  Seen by EP-->amio started (pt wished to  avoid EPS/RFCA).   Seasonal allergies    Shortness of breath    Skin cancer    multiple from face   Squamous cell carcinoma of skin 03/23/2013   L dorsal hand   Squamous cell carcinoma of skin 02/04/2014   R forearm/in situ, L pretibial   Squamous cell carcinoma of skin 09/24/2018   R thumb   Past Surgical History:  Procedure Laterality Date   BACK SURGERY     4/12  Dr Shellia Carwin- for lumbar stenosis   BREAST CYST ASPIRATION Left 1965   negative   [place]   lumpectomy with biopsy   left   CARPAL TUNNEL RELEASE     right   COLONOSCOPY     COLONOSCOPY WITH PROPOFOL N/A 06/30/2015   Procedure: COLONOSCOPY WITH PROPOFOL;  Surgeon: Hulen Luster, MD;  Location: [facility] ENDOSCOPY;  Service: Gastroenterology;  Laterality: N/A;   COLONOSCOPY WITH PROPOFOL N/A 01/22/2017   Procedure: COLONOSCOPY WITH PROPOFOL;  Surgeon: Lollie Sails, MD;  Location: [facility] ENDOSCOPY;  Service: Endoscopy;  Laterality: N/A;   COLONOSCOPY WITH PROPOFOL N/A 01/28/2019   Procedure: COLONOSCOPY WITH PROPOFOL;  Surgeon: Tol[place], Benay Pike, MD;  Location: [facility] ENDOSCOPY;  Service: Gastroenterology;  Laterality: N/A;   ESOPHAGOGASTRODUODENOSCOPY     ESOPHAGOGASTRODUODENOSCOPY (EGD)  WITH PROPOFOL N/A 01/28/2019   Procedure: ESOPHAGOGASTRODUODENOSCOPY (EGD) WITH PROPOFOL;  Surgeon: Toledo, Benay Pike, MD;  Location: [facility] ENDOSCOPY;  Service: Gastroenterology;  Laterality: N/A;   EYE SURGERY     cataract extraction with IOL bilaterally   JOINT REPLACEMENT     LUMBAR LAMINECTOMY/DECOMPRESSION MICRODISCECTOMY  05/24/2011   Procedure: LUMBAR LAMINECTOMY/DECOMPRESSION MICRODISCECTOMY 2 LEVELS;  Surgeon: Magnus Sinning, MD;  Location: [facility] ORS;  Service: Orthopedics;  Laterality: N/A;  L2-L3, L3-L4 (x-ray)   RIGHT OOPHORECTOMY     due to STAPH INFECTION    Allergies  Allergies  Allergen Reactions   Oxycodone Other (See Comments)    Mental status change   Prednisone     swelling, bruising.    History of  Present Illness    Kathryn [name] is a 87 y.o. female with PMH as above.  She underwent MPI 06/2012 that was ruled low risk.  She has history of 2014 carotid study without evidence of hemodynamically significant carotid stenosis.  She has a history of recently diagnosed HFrEF, PSVT, hypertension, COPD, prior smoking history, medication noncompliance.  She notes a family history of CAD, including that of her son.  She was seen in the hospital 03/27/2020 for SOB and LEE and placed on  BiPAP and IV Lasix started, as well as DuoNeb, steroids, and Nitropaste.  EF 40 to 45% with LBBB of unknown duration.   Short runs of atrial fibrillation were noted on telemetry with overall burden low.  She was successfully IV diuresed and switched to furosemide 20 mg daily with ARB and BB. Recommendation was for further ischemic workup as an OP for reduced EF. She was discharged with cardiac monitor with [facility] deferred.  04/15/2020 MPI as below and overall ruled intermediate risk.  Statin increased and pt started on ASA. 04/20/2020 Zio as below with recommendations to increase Coreg and EP referral provided.  She was seen by EP 04/2020 for symptomatic PSVT.  Options for treating SVT were discussed, including addition of antiarrhythmic therapy versus EP study and ablation.  She was not interested in ablation.  She wanted to try medical therapy.  Given her age and medical comorbidities and presence of left bundle branch block on EKG, she was started on amiodarone.  She was loaded with amiodarone with recommendation to see the patient back in 3 months for repeat labs and CMP, TSH, free T4.  With recurrent episodes of SVT, EP study with ablation was recommended.    Admitted 3/1 and 06/06/20 due to HF/ COPD exacerbation and IV diuresed with improvement in sx.  She was admitted 12/21/2020 for multifocal pneumonia.  Seen 01/13/21 and feeling more SOB when compared with previous clinic visits. She also felt short of breath at rest and  with exertion. She had a hacking cough ever since she left the hospital for her pna admission.  Her cough and shortness of breath then became worse last night. She felt short of breath at the time of her visit .  She had abdominal distention and early satiety, though felt her LEE improved from admission.  She noticed her weight was up 3 to 4 pounds from the day before.  She noticed her weight often increased 2 pounds between each day (which could mean increasing 10-14lbs in a week but not the 3 to 4 pounds overnight), so she did not take her extra Lasix.  We discussed taking her Lasix if 5 pound increase over a week.   She was considerably fatigued, though thought it  may be slightly better since discharge from the hospital for her pneumonia.  She was drinking 1 to 2 cups of coffee per day.  The day before, she had a pitcher of tea.  There were days where she was very thirsty and felt as if she is drinking all the time.  She did not eat as much prepackaged food as in the past but did admit to many prepackaged veggies.  She reported low salt, though on review of diet, she ate one half of a ham sandwich, vegetable soup, mixed greens, and a brownie.  We discussed her salt intake and low salt fluids. Wt 169-173lbs. Torsemide was increased for four days to diurese her between visits with close follow-up and labs collected.  Today, 01/20/21, she returns to clinic and notes that she feels her volume status / HF sx are still present. She had initial improvement in symptoms with increased dose diuresis for four days; however, when she decreased back to her previous dose of diuresis, she noted return of her previous sx of DOE/SOB, LEE, and cough.  She notes her primary sx today as early satiety, SOB/DOE, and LEE. She has also noted wt gain. She has been monitoring her weight and this morning it was 169 --> 172. She denies any chest pain,. We again reviewed diet and discovered ways to reduce both salt and fluids.   Home  Medications   Current Outpatient Medications  Medication Instructions   acetaminophen (TYLENOL) 1,000 mg, Every 6 hours PRN   albuterol (PROVENTIL) 2.5 mg, Nebulization, Every 6 hours PRN   amiodarone (PACERONE) 200 MG tablet TAKE 1 TABLET(200 MG) BY MOUTH DAILY AS DIRECTED   atorvastatin (LIPITOR) 40 mg, Oral, Daily   carvedilol (COREG) 6.25 mg, Oral, 2 times daily with meals   dapagliflozin propanediol (FARXIGA) 10 mg, Oral, Daily before breakfast   glucose blood (CONTOUR NEXT TEST) test strip Check sugar once daily  DX E11.9   hydrOXYzine (ATARAX/VISTARIL) 10 MG tablet TAKE 1 TO 2 TABLETS(10 TO 20 MG) BY MOUTH EVERY 6 HOURS AS NEEDED   levothyroxine (SYNTHROID) 100 MCG tablet TAKE 1 TABLET(100 MCG) BY MOUTH DAILY BEFORE BREAKFAST   losartan (COZAAR) 25 mg, Oral, Every evening   metFORMIN (GLUCOPHAGE) 1000 MG tablet TAKE 1 TABLET(1000 MG) BY MOUTH DAILY WITH SUPPER   omeprazole (PRILOSEC) 20 MG capsule TAKE 1 CAPSULE(20 MG) BY MOUTH DAILY   tiotropium (SPIRIVA) 18 mcg, Inhalation, Daily PRN   torsemide (DEMADEX) 20 MG tablet Take 20 mg po daily in AM. Take one extra dose 20mg  PRN 3lb weight gain or abdominal fullness.     Review of Systems    She reports shortness of breath, dyspnea, cough, weight gain, early satiety.  She notes LEE. She denies chest pain, palpitations, dizziness, syncope.   All other systems reviewed and are otherwise negative except as noted above.  Physical Exam    VS:  BP 130/64 (BP Location: Left Arm, Patient Position: Sitting, Cuff Size: Normal)   Pulse 66   Ht 5\' 4"  (1.626 m)   Wt 172 lb (78 kg)   SpO2 97%   BMI 29.52 kg/m  , BMI Body mass index is 29.52 kg/m. GEN: Elderly female, in no acute distress.  Joined by her daughter. HEENT: normal. Neck: Supple, JVP approximately 10 to 11 cm.  No carotid bruits, or masses. Cardiac: RRR, 2/6 systolic murmur. No rubs, or gallops. No clubbing, cyanosis.  Moderate to 1+ bilateral lower extremity edema.   Radials/DP/PT 2+ and equal  bilaterally.  Respiratory: Bilateral crackles all the way up to the mid lung. GI: Soft, nontender, nondistended, BS + x 4. MS: no deformity or atrophy. Skin: warm and dry, no rash. Neuro:  Strength and sensation are intact. Psych: Normal affect.  Accessory Clinical Findings    ECG personally reviewed by me today -NSR, 66 bpm, LBBB, first-degree AV block, prolonged QTc- no acute changes.  VITALS Reviewed today   Temp Readings from Last 3 Encounters:  01/04/21 97.9 F (36.6 C) (Oral)  12/24/20 98.7 F (37.1 C)  10/03/20 98.2 F (36.8 C)   BP Readings from Last 3 Encounters:  01/20/21 130/64  01/13/21 120/60  01/12/21 112/70   Pulse Readings from Last 3 Encounters:  01/20/21 66  01/13/21 68  01/12/21 74    Wt Readings from Last 3 Encounters:  01/20/21 172 lb (78 kg)  01/13/21 175 lb (79.4 kg)  01/12/21 178 lb (80.7 kg)     LABS  reviewed today   Lab Results  Component Value Date   WBC 5.7 01/11/2021   HGB 11.2 01/11/2021   HCT 37.1 01/11/2021   MCV 83 01/11/2021   PLT 341 01/11/2021   Lab Results  Component Value Date   CREATININE 1.01 (H) 01/13/2021   BUN 19 01/13/2021   NA 138 01/13/2021   K 5.0 01/13/2021   CL 100 01/13/2021   CO2 22 01/13/2021   Lab Results  Component Value Date   ALT 13 12/21/2020   AST 15 12/21/2020   ALKPHOS 84 12/21/2020   BILITOT 0.6 12/21/2020   Lab Results  Component Value Date   CHOL 177 10/03/2020   HDL 55 10/03/2020   LDLCALC 83 10/03/2020   TRIG 239 (H) 10/03/2020   CHOLHDL 3.2 10/03/2020    Lab Results  Component Value Date   HGBA1C 7.8 (H) 12/23/2020   Lab Results  Component Value Date   TSH 5.930 (H) 01/13/2021     STUDIES/PROCEDURES reviewed today   Zio 04/20/20 Event monitor Patch Wear Time:  13 days and 22 hours    Normal sinus rhythm Patient had a min HR of 66 bpm, max HR of 211 bpm, and avg HR of 87 bpm.     1 run of Ventricular Tachycardia occurred lasting 4 beats  with a max rate of 185 bpm (avg 168 bpm).    43 Supraventricular Tachycardia  runs occurred, the run with the fastest interval lasting 10 hours 35 mins with a max rate of 211 bpm (avg 153 bpm); the run with the fastest interval was also the longest.   Idioventricular Rhythm was present. Isolated SVEs were rare (<1.0%), SVE Couplets  were rare (<1.0%), and SVE Triplets were rare (<1.0%).  Isolated VEs were rare (<1.0%, 2912), VE Couplets were rare (<1.0%, 23), and VE Triplets were rare (<1.0%, 7).  Ventricular Bigeminy and Trigeminy were present.    Patient triggered events (shortness of breath, fluttering/racing)  were not associated with significant arrhythmia  MPI 04/15/20 ST segment depression was noted during stress in the aVF, II and III leads. EKG is not interpretable due to underlying left bundle branch block. This is an intermediate risk study due to reduced ejection fraction. The left ventricular ejection fraction is moderately decreased (30-44%). No significant perfusion defects are noted. Possible nonischemic cardiomyopathy. CT attenuation images shows aortic calcifications and moderate coronary calcifications mostly in the proximal LAD distribution  Echo 03/28/2020  1. Left ventricular ejection fraction, by estimation, is 40 to 45%. The  left ventricle  has mildly decreased function. Left ventricular endocardial  border not optimally defined to evaluate regional wall motion. Left  ventricular diastolic parameters are  consistent with Grade II diastolic dysfunction (pseudonormalization). The  global longitudinal strain is abnormal.   2. Right ventricular systolic function is normal. The right ventricular  size is normal. Tricuspid regurgitation signal is inadequate for assessing  PA pressure.   3. Left atrial size was mildly dilated.   4. The mitral valve is abnormal. Mild mitral valve regurgitation. No  evidence of mitral stenosis.   5. The aortic valve is normal in structure.  Aortic valve regurgitation is  mild. Mild aortic valve sclerosis is present, with no evidence of aortic  valve stenosis.   MPI 2014 Documented as low risk.   05/2010 Carotids IMPRESSION: no evidence of hemodynamically significant carotid  stenosis.    Assessment & Plan    Acute on chronic heart failure with reduced ejection fraction --Reports sx consistent with overload. Initial improvement with increased diuresis reported, dissipating after return to previous diuresis dose. Volume up on exam despite recent increase in diuresis.  EF 40 to 45%, G2DD, mild LAE. Given LBBB and reduced EF, MPI performed as OP and ruled intermediate risk. Further workup deferred given lack of anginal sx at subsequent visits. Increase to torsemide 40 mg daily going forward BMET, BNP when RTC. Continue Farxiga, losartan, Coreg.   Escalate GDMT as tolerated at RTC CHF education   Left Bundle Branch Block, known Intermediate MPI --Reports DOE with previous concern for anginal equivalent given reduced EF and LBBB at previous admissions. MPI intermediate risk. If ongoing sx s/p new dose of diuresis, could consider further ischemic workup with R/LHC at RTC. Continue medical management and aggressive RF modification. Continue ASA, BB, statin.     PSVT --As above - seen by EP after her monitor and started on amiodarone. Monitoring labs collected at previous visit. TSH was elevated with nl FT4. Continue to monitor Qtc, which currently still remains below 564ms.  Follow-up with EP as recommended.   Hypertension, goal BP less than 130/80 --Continue current medications, including Coreg and Losartan.   HLD, goal LDL <70 --Continue statin.    Mild MR, aortic sclerosis --Recommend periodic echo to monitor.   Hypothyroidism -- Monitoring labs performed on amiodarone. TSH elevated with FF4 nl. Nonspecific. Will continue amiodarone and defer any further changes to EP. Continue current Synthroid.  Further management  per PCP.   DM2 --Glycemic control recommended for risk factor modification. Continue Wilder Glade and current metformin.  Further management per PCP.   COPD, history of tobacco use --Continue current inhalers. Caution with albuterol, given this will likely exacerbate any elevated rates.  Caution with steroids.  Further management per PCP.    *Please be aware that the above documentation was completed voice recognition software and may contain dictation errors.     Arvil Chaco, PA-C 01/20/2021

## 2021-01-21 LAB — BASIC METABOLIC PANEL
BUN/Creatinine Ratio: 18 (ref 12–28)
BUN: 22 mg/dL (ref 8–27)
CO2: 21 mmol/L (ref 20–29)
Calcium: 9.8 mg/dL (ref 8.7–10.3)
Chloride: 103 mmol/L (ref 96–106)
Creatinine, Ser: 1.19 mg/dL — ABNORMAL HIGH (ref 0.57–1.00)
Glucose: 152 mg/dL — ABNORMAL HIGH (ref 70–99)
Potassium: 5 mmol/L (ref 3.5–5.2)
Sodium: 142 mmol/L (ref 134–144)
eGFR: 45 mL/min/{1.73_m2} — ABNORMAL LOW (ref >59)

## 2021-01-21 LAB — BRAIN NATRIURETIC PEPTIDE: BNP: 170.3 pg/mL — ABNORMAL HIGH (ref 0.0–100.0)

## 2021-01-23 DIAGNOSIS — J441 Chronic obstructive pulmonary disease with (acute) exacerbation: Secondary | ICD-10-CM | POA: Diagnosis not present

## 2021-01-30 ENCOUNTER — Ambulatory Visit (INDEPENDENT_AMBULATORY_CARE_PROVIDER_SITE_OTHER): Payer: Medicare Other | Admitting: Dermatology

## 2021-01-30 ENCOUNTER — Encounter: Payer: Self-pay | Admitting: Dermatology

## 2021-01-30 ENCOUNTER — Other Ambulatory Visit: Payer: Self-pay

## 2021-01-30 DIAGNOSIS — L578 Other skin changes due to chronic exposure to nonionizing radiation: Secondary | ICD-10-CM

## 2021-01-30 DIAGNOSIS — D692 Other nonthrombocytopenic purpura: Secondary | ICD-10-CM

## 2021-01-30 DIAGNOSIS — L570 Actinic keratosis: Secondary | ICD-10-CM

## 2021-01-30 DIAGNOSIS — L820 Inflamed seborrheic keratosis: Secondary | ICD-10-CM

## 2021-01-30 DIAGNOSIS — L821 Other seborrheic keratosis: Secondary | ICD-10-CM | POA: Diagnosis not present

## 2021-01-30 DIAGNOSIS — Z85828 Personal history of other malignant neoplasm of skin: Secondary | ICD-10-CM

## 2021-01-30 NOTE — Patient Instructions (Signed)

## 2021-01-30 NOTE — Progress Notes (Signed)
Follow-Up Visit   Subjective  Kathryn [name] is a 87 y.o. female who presents for the following: irregular skin lesions (On the face - scaly, crusted the one on the L temple is especially tender especially when there is no scab on it.).  The following portions of the chart were reviewed this encounter and updated as appropriate:   Tobacco  Allergies  Meds  Problems  Med Hx  Surg Hx  Fam Hx     Review of Systems:  No other skin or systemic complaints except as noted in HPI or Assessment and Plan.  Objective  Well appearing patient in no apparent distress; mood and affect are within normal limits.  A focused examination was performed including the face. Relevant physical exam findings are noted in the Assessment and Plan.  Face, neck, arms, and hands x 20 (20) Erythematous thin papules/macules with gritty scale.      Face, neck, arms, and hands x 17 (17) Erythematous keratotic or waxy stuck-on papule or plaque.    Assessment & Plan  AK (actinic keratosis) (20) Face, neck, arms, and hands x 20  Destruction of lesion - Face, neck, arms, and hands x 20 Complexity: simple   Destruction method: cryotherapy   Informed consent: discussed and consent obtained   Timeout:  patient name, date of birth, surgical site, and procedure verified Lesion destroyed using liquid nitrogen: Yes   Region frozen until ice ball extended beyond lesion: Yes   Outcome: patient tolerated procedure well with no complications   Post-procedure details: wound care instructions given    Inflamed seborrheic keratosis Face, neck, arms, and hands x 17  Destruction of lesion - Face, neck, arms, and hands x 17 Complexity: simple   Destruction method: cryotherapy   Informed consent: discussed and consent obtained   Timeout:  patient name, date of birth, surgical site, and procedure verified Lesion destroyed using liquid nitrogen: Yes   Region frozen until ice ball extended beyond lesion: Yes    Outcome: patient tolerated procedure well with no complications   Post-procedure details: wound care instructions given    Actinic Damage - chronic, secondary to cumulative UV radiation exposure/sun exposure over time - diffuse scaly erythematous macules with underlying dyspigmentation - Recommend daily broad spectrum sunscreen SPF 30+ to sun-exposed areas, reapply every 2 hours as needed.  - Recommend staying in the shade or wearing long sleeves, sun glasses (UVA+UVB protection) and wide brim hats (4-inch brim around the entire circumference of the hat). - Call for new or changing lesions.  Seborrheic Keratoses - Stuck-on, waxy, tan-brown papules and/or plaques  - Benign-appearing - Discussed benign etiology and prognosis. - Observe - Call for any changes  History of Squamous Cell Carcinoma of the Skin - No evidence of recurrence today - No lymphadenopathy - Recommend regular full body skin exams - Recommend daily broad spectrum sunscreen SPF 30+ to sun-exposed areas, reapply every 2 hours as needed.  - Call if any new or changing lesions are noted between office visits  Purpura - Chronic; persistent and recurrent.  Treatable, but not curable. - Violaceous macules and patches - Benign - Related to trauma, age, sun damage and/or use of blood thinners, chronic use of topical and/or oral steroids - Observe - Can use OTC arnica containing moisturizer such as Dermend Bruise Formula if desired - Call for worsening or other concerns  Return in about 3 months (around 05/02/2021) for AK f/u .  Luther Redo, CMA, am acting as scribe for  Sarina Ser, MD . Documentation: I have reviewed the above documentation for accuracy and completeness, and I agree with the above.  Sarina Ser, MD

## 2021-01-31 ENCOUNTER — Encounter: Payer: Self-pay | Admitting: Dermatology

## 2021-02-01 ENCOUNTER — Ambulatory Visit (INDEPENDENT_AMBULATORY_CARE_PROVIDER_SITE_OTHER): Payer: Medicare Other | Admitting: Family Medicine

## 2021-02-01 ENCOUNTER — Other Ambulatory Visit: Payer: Self-pay

## 2021-02-01 VITALS — BP 130/67 | HR 80 | Temp 98.1°F

## 2021-02-01 DIAGNOSIS — G8929 Other chronic pain: Secondary | ICD-10-CM

## 2021-02-01 DIAGNOSIS — I700 Atherosclerosis of aorta: Secondary | ICD-10-CM | POA: Diagnosis not present

## 2021-02-01 DIAGNOSIS — I10 Essential (primary) hypertension: Secondary | ICD-10-CM

## 2021-02-01 DIAGNOSIS — J189 Pneumonia, unspecified organism: Secondary | ICD-10-CM

## 2021-02-01 DIAGNOSIS — M545 Low back pain, unspecified: Secondary | ICD-10-CM | POA: Diagnosis not present

## 2021-02-01 DIAGNOSIS — M48061 Spinal stenosis, lumbar region without neurogenic claudication: Secondary | ICD-10-CM

## 2021-02-01 DIAGNOSIS — I5043 Acute on chronic combined systolic (congestive) and diastolic (congestive) heart failure: Secondary | ICD-10-CM

## 2021-02-01 DIAGNOSIS — J449 Chronic obstructive pulmonary disease, unspecified: Secondary | ICD-10-CM

## 2021-02-01 NOTE — Progress Notes (Signed)
Established patient visit   Patient: Kathryn [name]   DOB: [birth year]   87 y.o. Female  MRN: [id number] Visit Date: 02/01/2021  Today's healthcare provider: [name], MD   No chief complaint on file.  Subjective    HPI  Patient is an 87 year old female who presents for follow up of pneumonia. She was last seen 1 month ago.  Patient states she is doing better.  She has some episodes of non productive cough.  She states she only gets short of breath when she is having to do things but says it is because of her back and not her lungs.  She states that the only problem is that she still has no energy. She admits that she is feeling a good bit better since the last visit.     Medications: Outpatient Medications Prior to Visit  Medication Sig   acetaminophen (TYLENOL) 500 MG tablet Take 1,000 mg by mouth every 6 (six) hours as needed. Pain    albuterol (PROVENTIL) (2.5 MG/3ML) 0.083% nebulizer solution Take 3 mLs (2.5 mg total) by nebulization every 6 (six) hours as needed for wheezing or shortness of breath.   amiodarone (PACERONE) 200 MG tablet TAKE 1 TABLET(200 MG) BY MOUTH DAILY AS DIRECTED   atorvastatin (LIPITOR) 40 MG tablet Take 1 tablet (40 mg total) by mouth daily.   carvedilol (COREG) 6.25 MG tablet Take 1 tablet (6.25 mg total) by mouth 2 (two) times daily with a meal.   dapagliflozin propanediol (FARXIGA) 10 MG TABS tablet Take 1 tablet (10 mg total) by mouth daily before breakfast.   glucose blood (CONTOUR NEXT TEST) test strip Check sugar once daily  DX E11.9   hydrOXYzine (ATARAX/VISTARIL) 10 MG tablet TAKE 1 TO 2 TABLETS(10 TO 20 MG) BY MOUTH EVERY 6 HOURS AS NEEDED   levothyroxine (SYNTHROID) 100 MCG tablet TAKE 1 TABLET(100 MCG) BY MOUTH DAILY BEFORE BREAKFAST   losartan (COZAAR) 25 MG tablet Take 1 tablet (25 mg total) by mouth every evening.   metFORMIN (GLUCOPHAGE) 1000 MG tablet TAKE 1 TABLET(1000 MG) BY MOUTH DAILY WITH SUPPER   omeprazole  (PRILOSEC) 20 MG capsule TAKE 1 CAPSULE(20 MG) BY MOUTH DAILY   tiotropium (SPIRIVA) 18 MCG inhalation capsule Place 1 capsule (18 mcg total) into inhaler and inhale daily as needed.   Torsemide 40 MG TABS Take 40 mg by mouth daily.   No facility-administered medications prior to visit.    Review of Systems  Respiratory:  Positive for cough and shortness of breath. Negative for wheezing.   Cardiovascular:  Negative for chest pain, palpitations and leg swelling.       Objective    BP 130/67 (BP Location: Left Arm, Patient Position: Sitting, Cuff Size: Normal)   Pulse 80   Temp 98.1 F (36.7 C) (Oral)   SpO2 100%  BP Readings from Last 3 Encounters:  02/01/21 130/67  01/20/21 130/64  01/13/21 120/60   Wt Readings from Last 3 Encounters:  01/20/21 172 lb (78 kg)  01/13/21 175 lb (79.4 kg)  01/12/21 178 lb (80.7 kg)      Physical Exam Vitals and nursing note reviewed.  Constitutional:      Appearance: Normal appearance. She is normal weight.  HENT:     Right Ear: Tympanic membrane normal.     Left Ear: Tympanic membrane normal.     Nose: Nose normal.     Mouth/Throat:     Mouth: Mucous membranes are moist.  Pharynx: Oropharynx is clear.  Eyes:     General: No scleral icterus.    Conjunctiva/sclera: Conjunctivae normal.  Cardiovascular:     Rate and Rhythm: Normal rate and regular rhythm.     Pulses: Normal pulses.     Heart sounds: Normal heart sounds.  Pulmonary:     Effort: Pulmonary effort is normal.     Breath sounds: Normal breath sounds.  Abdominal:     General: Bowel sounds are normal.     Palpations: Abdomen is soft.  Musculoskeletal:     Cervical back: Normal range of motion and neck supple.     Comments: Trace. pedal edema in ankles  Skin:    General: Skin is warm and dry.  Neurological:     Mental Status: She is alert.  Psychiatric:        Mood and Affect: Mood normal.        Behavior: Behavior normal.        Thought Content: Thought content  normal.        Judgment: Judgment normal.      No results found for any visits on 02/01/21.  Assessment & Plan     1. Pneumonia due to infectious organism, unspecified laterality, unspecified part of lung F/u  chest x-ray. - DG Chest 2 View  2. Multifocal pneumonia Hopefully resolve. 3. COPD, mild ([place]) Patient quit smoking 15 years ago  4. Essential hypertension Good control  5. Aortic atherosclerosis (HCC) Risk factors treated  6. Acute on chronic combined systolic and diastolic CHF (congestive heart failure) (HCC) Torsemide has recently been double  7. Chronic right-sided low back pain without sciatica Chronic pain impacts patient's quality of life.  8. Spinal stenosis of lumbar region without neurogenic claudication    No follow-ups on file.      I, [name], MD, have reviewed all documentation for this visit. The documentation on 02/05/21 for the exam, diagnosis, procedures, and orders are all accurate and complete.    Rilley Stash Cranford Mon, MD  [facility] [phone] (phone) [phone] (fax)  [place]

## 2021-02-02 ENCOUNTER — Telehealth: Payer: Self-pay

## 2021-02-02 NOTE — Telephone Encounter (Signed)
Pt saw Marrianne Mood last OV Torsemide briefly increased to Torsemide 40 mg qd. Please advise for refill.

## 2021-02-02 NOTE — Telephone Encounter (Signed)
Lmovm for pt to contact pharmacy to have medication refills.

## 2021-02-02 NOTE — Telephone Encounter (Signed)
*  STAT* If patient is at the pharmacy, call can be transferred to refill team.   1. Which medications need to be refilled? (please list name of each medication and dose if known) Demadex  2. Which pharmacy/location (including street and city if local pharmacy) is medication to be sent to? [facility] [place]  3. Do they need a 30 day or 90 day supply? [place]

## 2021-02-02 NOTE — Telephone Encounter (Signed)
Her prescription was sent to the pharmacy at the time of her office visit:  Torsemide 40 MG TABS 30 tablet 6 01/20/2021    Sig - Route: Take 40 mg by mouth daily. - Oral   Sent to pharmacy as: Torsemide 40 MG Tab   Notes to Pharmacy: Dose increase   E-Prescribing Status: Receipt confirmed by pharmacy (01/20/2021  4:38 PM EDT)

## 2021-02-08 ENCOUNTER — Ambulatory Visit
Admission: RE | Admit: 2021-02-08 | Discharge: 2021-02-08 | Disposition: A | Discharge status: Home / Self Care | Payer: Medicare Other | Attending: Family Medicine | Admitting: Family Medicine

## 2021-02-08 ENCOUNTER — Ambulatory Visit
Admission: RE | Admit: 2021-02-08 | Discharge: 2021-02-08 | Disposition: A | Discharge status: Home / Self Care | Payer: Medicare Other | Source: Ambulatory Visit | Attending: Family Medicine | Admitting: Family Medicine

## 2021-02-08 DIAGNOSIS — J189 Pneumonia, unspecified organism: Secondary | ICD-10-CM | POA: Diagnosis not present

## 2021-02-08 DIAGNOSIS — M19011 Primary osteoarthritis, right shoulder: Secondary | ICD-10-CM | POA: Diagnosis not present

## 2021-02-08 DIAGNOSIS — M19012 Primary osteoarthritis, left shoulder: Secondary | ICD-10-CM | POA: Diagnosis not present

## 2021-02-09 ENCOUNTER — Ambulatory Visit (INDEPENDENT_AMBULATORY_CARE_PROVIDER_SITE_OTHER): Payer: Medicare Other

## 2021-02-09 DIAGNOSIS — J449 Chronic obstructive pulmonary disease, unspecified: Secondary | ICD-10-CM

## 2021-02-09 NOTE — Patient Instructions (Signed)
Thank you for allowing the Chronic Care Management team to participate in your care.  

## 2021-02-09 NOTE — Chronic Care Management (AMB) (Addendum)
Chronic Care Management   CCM RN Visit Note  02/09/2021 Name: Kathryn [name] MRN: [id number] DOB: [birth year]  Subjective: Kathryn [name] is a 87 y.o. year old female who is a primary care patient of Jerrol Banana., MD. The care management team was consulted for assistance with disease management and care coordination needs.    Engaged with patient by telephone for follow up visit in response to provider referral for case management and care coordination services.   Consent to Services:  The patient was given information about Chronic Care Management services, agreed to services, and gave verbal consent prior to initiation of services.  Please see initial visit note for detailed documentation.    Assessment: Review of patient past medical history, allergies, medications, health status, including review of consultants reports, laboratory and other test data, was performed as part of comprehensive evaluation and provision of chronic care management services.   SDOH (Social Determinants of Health) assessments and interventions performed:  No  CCM Care Plan  Allergies  Allergen Reactions   Oxycodone Other (See Comments)    Mental status change   Prednisone     swelling, bruising.    Outpatient Encounter Medications as of 02/09/2021  Medication Sig   acetaminophen (TYLENOL) 500 MG tablet Take 1,000 mg by mouth every 6 (six) hours as needed. Pain    albuterol (PROVENTIL) (2.5 MG/3ML) 0.083% nebulizer solution Take 3 mLs (2.5 mg total) by nebulization every 6 (six) hours as needed for wheezing or shortness of breath.   amiodarone (PACERONE) 200 MG tablet TAKE 1 TABLET(200 MG) BY MOUTH DAILY AS DIRECTED   atorvastatin (LIPITOR) 40 MG tablet Take 1 tablet (40 mg total) by mouth daily.   carvedilol (COREG) 6.25 MG tablet Take 1 tablet (6.25 mg total) by mouth 2 (two) times daily with a meal.   dapagliflozin propanediol (FARXIGA) 10 MG TABS tablet Take 1 tablet (10 mg total) by  mouth daily before breakfast.   glucose blood (CONTOUR NEXT TEST) test strip Check sugar once daily  DX E11.9   hydrOXYzine (ATARAX/VISTARIL) 10 MG tablet TAKE 1 TO 2 TABLETS(10 TO 20 MG) BY MOUTH EVERY 6 HOURS AS NEEDED   levothyroxine (SYNTHROID) 100 MCG tablet TAKE 1 TABLET(100 MCG) BY MOUTH DAILY BEFORE BREAKFAST   losartan (COZAAR) 25 MG tablet Take 1 tablet (25 mg total) by mouth every evening.   metFORMIN (GLUCOPHAGE) 1000 MG tablet TAKE 1 TABLET(1000 MG) BY MOUTH DAILY WITH SUPPER   omeprazole (PRILOSEC) 20 MG capsule TAKE 1 CAPSULE(20 MG) BY MOUTH DAILY   tiotropium (SPIRIVA) 18 MCG inhalation capsule Place 1 capsule (18 mcg total) into inhaler and inhale daily as needed.   Torsemide 40 MG TABS Take 40 mg by mouth daily.   No facility-administered encounter medications on file as of 02/09/2021.    Patient Active Problem List   Diagnosis Date Noted   Normocytic anemia 12/21/2020   On amiodarone therapy 08/24/2020   Acute on chronic combined systolic and diastolic CHF (congestive heart failure) ([place]) 06/06/2020   PSVT (paroxysmal supraventricular tachycardia) (HCC)    AKI (acute kidney injury) ([place]) 05/26/2020   Aortic atherosclerosis ([place]) 05/25/2020   Acute on chronic respiratory failure with hypoxia ([place]) 05/24/2020   COPD exacerbation ([place]) 05/24/2020   Diabetes mellitus without complication ([place]) 76/72/0947   Acute CHF ([place]) 03/27/2020   Acute CHF (congestive heart failure) ([place]) 03/27/2020   Acute respiratory failure with hypoxia (HCC)    Diabetes mellitus type 2, uncomplicated ([place]) 09/62/8366  COPD, mild ([place]) 09/17/2014   Clinical depression 09/17/2014   Elevated platelet count 09/17/2014   Elevated erythrocyte sedimentation rate 09/17/2014   History of tobacco use 09/17/2014   Hypercholesteremia 09/17/2014   BP (high blood pressure) 09/17/2014   Hypothyroidism 09/17/2014   Cannot sleep 09/17/2014   Lumbar disc disease with radiculopathy 09/17/2014   Arthritis,  degenerative 09/17/2014   Excess weight 09/17/2014   Vertebrobasilar circulation transient ischemic attack 09/17/2014   Lung nodule, multiple 09/17/2014   Basilar artery insufficiency 09/17/2014   Neuritis or radiculitis due to rupture of lumbar intervertebral disc 08/25/2013   Degenerative arthritis of lumbar spine 08/25/2013   Lumbar canal stenosis 08/25/2013   Smoker 07/15/2012   Back pain 07/15/2012   SOB (shortness of breath) 07/15/2012   Hyperlipidemia 07/15/2012   Essential hypertension 07/15/2012    Patient Care Plan: COPD (Adult)     Problem Identified: Symptom Exacerbation (COPD)      Long-Range Goal: Symptom Exacerbation Prevented or Minimized   Start Date: 01/03/2021  Expected End Date: 04/03/2021  Priority: High  Note:   Current Barriers:  Chronic Disease Management support and educational needs r/t COPD.  Case Manager Clinical Goal(s): Over the next 90 days, patient will not require hospitalization or emergent care due to complications related to COPD.  Interventions:  Collaboration with Jerrol Banana., MD regarding development and update of comprehensive plan of care as evidenced by provider attestation and co-signature Inter-disciplinary care team collaboration (see longitudinal plan of care) Reports improvements today. Denies shortness of breath at rest. Will follow up within the month to discuss any changes in care management needs. Reviewed worsening s/sx that require immediate medical attention.     Self-Care/Patient Goals: Self administer medications as prescribed Assess symptoms daily and notify provider if not improving  Calls provider office for new concerns or questions   Follow Up Plan:  Will follow up within the next month     PLAN A member of the care management team will follow up with Kathryn [name] next month.   Cristy [facility] Care Management [facility] [phone]

## 2021-02-11 ENCOUNTER — Other Ambulatory Visit: Payer: Self-pay | Admitting: Family Medicine

## 2021-02-11 NOTE — Telephone Encounter (Signed)
Requested Prescriptions  Pending Prescriptions Disp Refills  . hydrOXYzine (ATARAX/VISTARIL) 10 MG tablet [Pharmacy Med Name: HYDROXYZINE HCL 10MG  TABLETS] 100 tablet 1    Sig: TAKE 1 TO 2 TABLETS(10 TO 20 MG) BY MOUTH EVERY 6 HOURS AS NEEDED     Ear, Nose, and Throat:  Antihistamines Passed - 02/11/2021  1:55 PM      Passed - Valid encounter within last 12 months    Recent Outpatient Visits          1 week ago Pneumonia due to infectious organism, unspecified laterality, unspecified part of lung   [facility] Jerrol Banana., MD   1 month ago Multifocal pneumonia   [facility] Jerrol Banana., MD   4 months ago Type 2 diabetes mellitus with diabetic neuropathy, without long-term current use of insulin [facility])   [facility] Jerrol Banana., MD   7 months ago Type 2 diabetes mellitus without complication, without long-term current use of insulin [facility])   [facility] Jerrol Banana., MD   10 months ago Acute systolic congestive heart failure [facility])   [facility] Jerrol Banana., MD      Future Appointments            In 1 week Gollan, Kathlene November, MD [facility], [facility]   In 2 months Ralene Bathe, MD [place]   In 3 months Jerrol Banana., MD [facility], [facility]

## 2021-02-19 ENCOUNTER — Other Ambulatory Visit: Payer: Self-pay | Admitting: Family

## 2021-02-20 ENCOUNTER — Ambulatory Visit (INDEPENDENT_AMBULATORY_CARE_PROVIDER_SITE_OTHER): Payer: Medicare Other | Admitting: Cardiovascular Disease

## 2021-02-20 ENCOUNTER — Encounter: Payer: Self-pay | Admitting: Cardiovascular Disease

## 2021-02-20 ENCOUNTER — Other Ambulatory Visit: Payer: Self-pay

## 2021-02-20 VITALS — BP 120/60 | HR 81 | Ht 64.0 in | Wt 171.1 lb

## 2021-02-20 DIAGNOSIS — I10 Essential (primary) hypertension: Secondary | ICD-10-CM

## 2021-02-20 DIAGNOSIS — I5042 Chronic combined systolic (congestive) and diastolic (congestive) heart failure: Secondary | ICD-10-CM

## 2021-02-20 DIAGNOSIS — E785 Hyperlipidemia, unspecified: Secondary | ICD-10-CM | POA: Diagnosis not present

## 2021-02-20 DIAGNOSIS — I471 Supraventricular tachycardia: Secondary | ICD-10-CM | POA: Diagnosis not present

## 2021-02-20 DIAGNOSIS — I700 Atherosclerosis of aorta: Secondary | ICD-10-CM

## 2021-02-20 DIAGNOSIS — J432 Centrilobular emphysema: Secondary | ICD-10-CM

## 2021-02-20 MED ORDER — TORSEMIDE 40 MG PO TABS: 40.0000 mg | ORAL_TABLET | Freq: Every day | ORAL | 11 refills | Status: DC

## 2021-02-20 NOTE — Progress Notes (Signed)
Cardiology Office Note  Date:  02/20/2021   ID:  Kathryn [name], DOB 30-Mar-1933, MRN [id number]  PCP:  Jerrol Banana., MD   Chief Complaint  Patient presents with   1 month follow up     Patient c/o shortness of breath. Medications reviewed by the patient verbally.     HPI:  [name] is a 87 yo woman with  long smoking history for 50 years , stopped 6-7 years ago,  COPD back surgery x2,  chronic back pain with pain radiating down her left leg, possible hip arthritis, chronic shortness of breath  Pulmonary HTN/flash pulm HTN EF 40 to 45% January 2022  hyperlipidemia  Frequent hospitalizations for COPD/CHF who presents for f/u of her shortness of breath, hyperlipidemia, diastolic CHF  LOV 03/4968  Admission to the hospital sept to oct 2022 ER, chest x-ray showed multifocal infiltrates, small pleural effusions.  "Pneumonia" started on diuretics, steroids, bronchodilators., Diuresed 5L  Taking torsemide 40 daily "Up from 20 daily, double dose working better" Still has some fullness in the abdomen, ankle swelling  Does not eat out much Does not drive Family has to drive her everywhere  back and lower extremity arthritides, chronic pain Sedentary, no exercise program  EKG personally reviewed by myself on todays visit Normal sinus rhythm left bundle branch block Unchanged  Other past medical hx hospitalized from 3/1 through 3/4 for COPD and CHF exacerbations. Again 06/08/2020   in the hospital 03/27/2020  Presented with leg edema and breathing difficulty. placed on BiPAP and IV Lasix started,  DuoNeb, steroids, and Nitropaste.  Echocardiogram : mildly reduced LVSF (EF 40 to 45%),  left bundle branch block of unknown duration.   Some question of whether there was atrial fibrillation seen on her monitor  seen in the heart failure clinic 04/04/2020.   moderate shortness of breath with exertion.    Zio monitor    PCP 04/05/2020.    improvement in her breathing  per documentation.   04/12/2020,   unable to sleep over the weekend / overnight.   feeling poorly and that symptoms similar to that before her admission   racing heart rate on Sunday night, 04/10/20.  atrial fib  Lasix up to 40 for 3 days then back to 20 daily Started on crestor 5 daily  Stress test ordered that showed no significant ischemia Ejection fraction 30 to 44% CT attenuation correction images with aortic calcification, coronary calcification proximal LAD region (Echocardiogram March 28, 2020 estimated ejection fraction 40 to 45%)   PMH:   has a past medical history of Actinic keratosis, Aortic atherosclerosis ([place]) (05/25/2020), Arthritis, Cardiomyopathy - presumed to be nonischemic, CHF (congestive heart failure) ([place]), COPD (chronic obstructive pulmonary disease) ([place]), Coronary artery calcification seen on CT scan, Diabetes mellitus without complication ([place]), Gastric ulcer (06/2010), Hyperlipidemia, Hypertension, Hypothyroidism, Left bundle branch block (LBBB), Neuromuscular disorder ([place]), Peripheral vascular disease ([place]), Pneumonia, PSVT (paroxysmal supraventricular tachycardia) ([place]), Seasonal allergies, Shortness of breath, Skin cancer, Squamous cell carcinoma of skin (03/23/2013), Squamous cell carcinoma of skin (02/04/2014), Squamous cell carcinoma of skin (09/24/2018), Squamous cell carcinoma of skin (09/08/2019), and Squamous cell carcinoma of skin (09/08/2019).  PSH:    Past Surgical History:  Procedure Laterality Date   BACK SURGERY     4/12  Dr Shellia Carwin- for lumbar stenosis   BREAST CYST ASPIRATION Left 1965   negative   [place]   lumpectomy with biopsy   left   CARPAL TUNNEL RELEASE  right   COLONOSCOPY     COLONOSCOPY WITH PROPOFOL N/A 06/30/2015   Procedure: COLONOSCOPY WITH PROPOFOL;  Surgeon: Hulen Luster, MD;  [facility] END[facility]COPY;  Service: Gastroenterology;  Laterality: N/A;   COLONOSCOPY WITH PROPOFOL N/A 01/22/2017   Procedure:  COLONOSCOPY WITH PROPOFOL;  Surgeon: Lollie Sails, MD;  Location: [facility] ENDOSCOPY;  Service: Endoscopy;  Laterality: N/A;   COLONOSCOPY WITH PROPOFOL N/A 01/28/2019   Procedure: COLONOSCOPY WITH PROPOFOL;  Surgeon: Toledo, Benay Pike, MD;  Location: [facility] ENDOSCOPY;  Service: Gastroenterology;  Laterality: N/A;   ESOPHAGOGASTRODUODENOSCOPY     ESOPHAGOGASTRODUODENOSCOPY (EGD) WITH PROPOFOL N/A 01/28/2019   Procedure: ESOPHAGOGASTRODUODENOSCOPY (EGD) WITH PROPOFOL;  Surgeon: Toledo, Benay Pike, MD;  Location: [facility] ENDOSCOPY;  Service: Gastroenterology;  Laterality: N/A;   EYE SURGERY     cataract extraction with IOL bilaterally   JOINT REPLACEMENT     LUMBAR LAMINECTOMY/DECOMPRESSION MICRODISCECTOMY  05/24/2011   Procedure: LUMBAR LAMINECTOMY/DECOMPRESSION MICRODISCECTOMY 2 LEVELS;  Surgeon: Magnus Sinning, MD;  Location: [facility] ORS;  Service: Orthopedics;  Laterality: N/A;  L2-L3, L3-L4 (x-ray)   RIGHT OOPHORECTOMY     due to STAPH INFECTION    Current Outpatient Medications  Medication Sig Dispense Refill   acetaminophen (TYLENOL) 500 MG tablet Take 1,000 mg by mouth every 6 (six) hours as needed. Pain      albuterol (PROVENTIL) (2.5 MG/3ML) 0.083% nebulizer solution Take 3 mLs (2.5 mg total) by nebulization every 6 (six) hours as needed for wheezing or shortness of breath. 150 mL 12   amiodarone (PACERONE) 200 MG tablet TAKE 1 TABLET(200 MG) BY MOUTH DAILY AS DIRECTED 90 tablet 2   aspirin (ASPIRIN 81) 81 MG EC tablet Take 81 mg by mouth daily. Swallow whole.     atorvastatin (LIPITOR) 40 MG tablet Take 1 tablet (40 mg total) by mouth daily. 90 tablet 3   carvedilol (COREG) 6.25 MG tablet Take 1 tablet (6.25 mg total) by mouth 2 (two) times daily with a meal. 180 tablet 3   FARXIGA 10 MG TABS tablet TAKE 1 TABLET(10 MG) BY MOUTH DAILY BEFORE BREAKFAST 30 tablet 5   glucose blood (CONTOUR NEXT TEST) test strip Check sugar once daily  DX E11.9 100 each 3   hydrOXYzine (ATARAX/VISTARIL) 10 MG  tablet TAKE 1 TO 2 TABLETS(10 TO 20 MG) BY MOUTH EVERY 6 HOURS AS NEEDED 100 tablet 1   levothyroxine (SYNTHROID) 100 MCG tablet TAKE 1 TABLET(100 MCG) BY MOUTH DAILY BEFORE BREAKFAST 90 tablet 3   losartan (COZAAR) 25 MG tablet Take 1 tablet (25 mg total) by mouth every evening. 90 tablet 3   metFORMIN (GLUCOPHAGE) 1000 MG tablet TAKE 1 TABLET(1000 MG) BY MOUTH DAILY WITH SUPPER 90 tablet 3   omeprazole (PRILOSEC) 20 MG capsule TAKE 1 CAPSULE(20 MG) BY MOUTH DAILY 90 capsule 0   tiotropium (SPIRIVA) 18 MCG inhalation capsule Place 1 capsule (18 mcg total) into inhaler and inhale daily as needed. 30 capsule 12   Torsemide 40 MG TABS Take 40 mg by mouth daily. 30 tablet 6   No current facility-administered medications for this visit.     Allergies:   Oxycodone and Prednisone   Social History:  The patient  reports that she quit smoking about 16 years ago. Her smoking use included cigarettes. She has a 25.00 pack-year smoking history. She has never used smokeless tobacco. She reports current alcohol use. She reports that she does not use drugs.   Family History:   family history includes Arthritis in her  son; Breast cancer in her maternal aunt; Dementia in her mother; Heart disease in her father; Hypertension in her sister; Lung cancer in her father.    Review of Systems: Review of Systems  Constitutional: Negative.   HENT: Negative.    Respiratory: Negative.    Cardiovascular: Negative.   Gastrointestinal: Negative.   Musculoskeletal: Negative.   Neurological: Negative.   Psychiatric/Behavioral: Negative.    All other systems reviewed and are negative.  PHYSICAL EXAM: VS:  BP 120/60 (BP Location: Left Arm, Patient Position: Sitting, Cuff Size: Normal)   Pulse 81   Ht 5\' 4"  (1.626 m)   Wt 171 lb 2 oz (77.6 kg)   SpO2 98%   BMI 29.37 kg/m  , BMI Body mass index is 29.37 kg/m. Constitutional:  oriented to person, place, and time. No distress.  HENT:  Head: Grossly normal Eyes:   no discharge. No scleral icterus.  Neck: No JVD, no carotid bruits  Cardiovascular: Regular rate and rhythm, no murmurs appreciated Pulmonary/Chest: Clear to auscultation bilaterally, no wheezes or rails Abdominal: Soft.  no distension.  no tenderness.  Musculoskeletal: Normal range of motion Neurological:  normal muscle tone. Coordination normal. No atrophy Skin: Skin warm and dry Psychiatric: normal affect, pleasant  Recent Labs: 06/07/2020: Magnesium 1.9 12/21/2020: ALT 13 01/11/2021: Hemoglobin 11.2; Platelets 341 01/13/2021: TSH 5.930 01/20/2021: BNP 170.3; BUN 22; Creatinine, Ser 1.19; Potassium 5.0; Sodium 142    Lipid Panel Lab Results  Component Value Date   CHOL 177 10/03/2020   HDL 55 10/03/2020   LDLCALC 83 10/03/2020   TRIG 239 (H) 10/03/2020     Wt Readings from Last 3 Encounters:  02/20/21 171 lb 2 oz (77.6 kg)  01/20/21 172 lb (78 kg)  01/13/21 175 lb (79.4 kg)     ASSESSMENT AND PLAN:  Problem List Items Addressed This Visit       Cardiology Problems   Essential hypertension   Relevant Medications   aspirin (ASPIRIN 81) 81 MG EC tablet   PSVT (paroxysmal supraventricular tachycardia) (HCC)   Relevant Medications   aspirin (ASPIRIN 81) 81 MG EC tablet   Aortic atherosclerosis (HCC)   Relevant Medications   aspirin (ASPIRIN 81) 81 MG EC tablet   Other Visit Diagnoses     Chronic combined systolic and diastolic CHF, NYHA class 2 and ACC/AHA stage C (HCC)    -  Primary   Relevant Medications   aspirin (ASPIRIN 81) 81 MG EC tablet   Centrilobular emphysema (HCC)       Hyperlipidemia LDL goal <70       Relevant Medications   aspirin (ASPIRIN 81) 81 MG EC tablet     COPD/emphysema Reports symptoms are stable, no longer eating out, takes torsemide daily Feels her breathing is stable Weight stable, takes extra torsemide for ankle lower abdominal swelling  Cardiomyopathy Ejection fraction 40 to 45% in January 2022 Continue Coreg, Farxiga,  losartan Torsemide 40 daily, extra torsemide 20  milligrams in the p.m. for abdominal fullness or leg swelling  Atrial tachycardia Continue beta-blocker at current dose  Hyperlipidemia Continue Lipitor  Coronary disease with stable angina Currently with no symptoms of angina. No further workup at this time. Continue current medication regimen.    Total encounter time more than 25 minutes  Greater than 50% was spent in counseling and coordination of care with the patient   Signed, Esmond Plants, M.D., Ph.D. [place], [place]

## 2021-02-20 NOTE — Patient Instructions (Addendum)
Medication Instructions:  Please START Torsemide 40 mg daily with extra 20 mg after lunch as needed for swelling  If you need a refill on your cardiac medications before your next appointment, please call your pharmacy.   Lab work: No new labs needed  Testing/Procedures: No new testing needed  Follow-Up: At [facility], you and your health needs are our priority.  As part of our continuing mission to provide you with exceptional heart care, we have created designated Provider Care Teams.  These Care Teams include your primary Cardiologist (physician) and Advanced Practice Providers (APPs -  Physician Assistants and Nurse Practitioners) who all work together to provide you with the care you need, when you need it.  You will need a follow up appointment in 6 months  Providers on your designated Care Team:   Murray Hodgkins, NP Christell Faith, PA-C Cadence Kathlen Mody, [place]  COVID-19 Vaccine Information can be found at: [email or web address] For questions related to vaccine distribution or appointments, please email [email or web address] or call [phone].

## 2021-02-21 ENCOUNTER — Telehealth (HOSPITAL_COMMUNITY): Payer: Self-pay

## 2021-02-21 NOTE — Telephone Encounter (Signed)
Today spoke to [facility].  She states doing well, she enjoyed her visit with Dr Rockey Situ yesterday and he said she was doing well.  She is aware of how to take her medications and has all her medications.  She watches her high sodium foods and fluids.  She lives with her son and his wife.  She has a lot of family support.  She is aware to call if any problems.  She weighs daily and aware to take an extra fluid pill with weight gain or fluid in her legs.  She is recovered from pneumonia and just now getting her energy back.  She stays active in her home, she does her own cooking and cleaning.  She denies any problems today such as chest pain, headaches, dizziness or increased shortness of breath.  She does not feel she needs a person visit since she is doing well.  Advised her in December when she has no appts will make an in person visit.  Will continue to visit for heart failure, diet and medicayion compliance.   [place] [phone]

## 2021-03-11 ENCOUNTER — Other Ambulatory Visit: Payer: Self-pay | Admitting: Family Medicine

## 2021-03-12 NOTE — Telephone Encounter (Signed)
Requested Prescriptions  Pending Prescriptions Disp Refills   omeprazole (PRILOSEC) 20 MG capsule [Pharmacy Med Name: OMEPRAZOLE 20MG  CAPSULES] 90 capsule 0    Sig: TAKE 1 CAPSULE(20 MG) BY MOUTH DAILY     Gastroenterology: Proton Pump Inhibitors Passed - 03/11/2021 10:29 AM      Passed - Valid encounter within last 12 months    Recent Outpatient Visits          1 month ago Pneumonia due to infectious organism, unspecified laterality, unspecified part of lung   [facility] Jerrol Banana., MD   2 months ago Multifocal pneumonia   [facility] Jerrol Banana., MD   5 months ago Type 2 diabetes mellitus with diabetic neuropathy, without long-term current use of insulin [facility])   [facility] Jerrol Banana., MD   8 months ago Type 2 diabetes mellitus without complication, without long-term current use of insulin [facility])   [facility] Jerrol Banana., MD   11 months ago Acute systolic congestive heart failure [facility])   [facility] Jerrol Banana., MD      Future Appointments            In 1 month Ralene Bathe, MD [place]   In 2 months Jerrol Banana., MD [facility] [place]   In 5 months Gollan, Kathlene November, MD [facility]

## 2021-03-22 ENCOUNTER — Other Ambulatory Visit (HOSPITAL_COMMUNITY): Payer: Self-pay

## 2021-03-22 NOTE — Progress Notes (Signed)
Today spoke with Kathryn [name]  She is doing well, her weight has stayed the same during Christmas holiday.  She did lose power and went to stay with her sister for 2 days.  She did say she had some edema in her legs 1 day but elevated and they went down.  She has everything she needs.  She has all her medications and aware of how to take them.  She lives in a small apartment attached to her sons home.  She has family support close by.  She drives only to a drive thru short distances. Will make a home visit next week.  She denies any problems such as chest pain, headaches, dizziness or increased shortness of breath.  Will visit for heart failure, diet and medication compliance.   [place] [phone]

## 2021-03-30 ENCOUNTER — Ambulatory Visit (INDEPENDENT_AMBULATORY_CARE_PROVIDER_SITE_OTHER): Payer: Medicare Other

## 2021-03-30 DIAGNOSIS — J449 Chronic obstructive pulmonary disease, unspecified: Secondary | ICD-10-CM

## 2021-03-30 DIAGNOSIS — I509 Heart failure, unspecified: Secondary | ICD-10-CM

## 2021-03-30 DIAGNOSIS — E119 Type 2 diabetes mellitus without complications: Secondary | ICD-10-CM

## 2021-03-30 DIAGNOSIS — I10 Essential (primary) hypertension: Secondary | ICD-10-CM

## 2021-03-30 NOTE — Patient Instructions (Signed)
Thank you for allowing the Chronic Care Management team to participate in your care. It was great speaking with you today! °

## 2021-03-30 NOTE — Chronic Care Management (AMB) (Signed)
Chronic Care Management   CCM RN Visit Note  03/30/2021 Name: Kathryn [name] MRN: [id number] DOB: 31-Jul-1933  Subjective: Kathryn [name] is a 88 y.o. year old female who is a primary care patient of Jerrol Banana., MD. The care management team was consulted for assistance with disease management and care coordination needs.    Engaged with patient by telephone for follow up visit in response to provider referral for case management and care coordination services.   Consent to Services:  The patient was given information about Chronic Care Management services, agreed to services, and gave verbal consent prior to initiation of services.  Please see initial visit note for detailed documentation.   Assessment: Review of patient past medical history, allergies, medications, health status, including review of consultants reports, laboratory and other test data, was performed as part of comprehensive evaluation and provision of chronic care management services.   SDOH (Social Determinants of Health) assessments and interventions performed: No  CCM Care Plan  Allergies  Allergen Reactions   Oxycodone Other (See Comments)    Mental status change   Prednisone     swelling, bruising.    Outpatient Encounter Medications as of 03/30/2021  Medication Sig   acetaminophen (TYLENOL) 500 MG tablet Take 1,000 mg by mouth every 6 (six) hours as needed. Pain    albuterol (PROVENTIL) (2.5 MG/3ML) 0.083% nebulizer solution Take 3 mLs (2.5 mg total) by nebulization every 6 (six) hours as needed for wheezing or shortness of breath.   amiodarone (PACERONE) 200 MG tablet TAKE 1 TABLET(200 MG) BY MOUTH DAILY AS DIRECTED   aspirin 81 MG EC tablet Take 81 mg by mouth daily. Swallow whole.   atorvastatin (LIPITOR) 40 MG tablet Take 1 tablet (40 mg total) by mouth daily.   carvedilol (COREG) 6.25 MG tablet Take 1 tablet (6.25 mg total) by mouth 2 (two) times daily with a meal.   FARXIGA 10 MG TABS tablet  TAKE 1 TABLET(10 MG) BY MOUTH DAILY BEFORE BREAKFAST   glucose blood (CONTOUR NEXT TEST) test strip Check sugar once daily  DX E11.9   hydrOXYzine (ATARAX/VISTARIL) 10 MG tablet TAKE 1 TO 2 TABLETS(10 TO 20 MG) BY MOUTH EVERY 6 HOURS AS NEEDED   levothyroxine (SYNTHROID) 100 MCG tablet TAKE 1 TABLET(100 MCG) BY MOUTH DAILY BEFORE BREAKFAST   losartan (COZAAR) 25 MG tablet Take 1 tablet (25 mg total) by mouth every evening.   metFORMIN (GLUCOPHAGE) 1000 MG tablet TAKE 1 TABLET(1000 MG) BY MOUTH DAILY WITH SUPPER   omeprazole (PRILOSEC) 20 MG capsule TAKE 1 CAPSULE(20 MG) BY MOUTH DAILY   tiotropium (SPIRIVA) 18 MCG inhalation capsule Place 1 capsule (18 mcg total) into inhaler and inhale daily as needed.   Torsemide 40 MG TABS Take 40 mg by mouth daily. May take extra 20 mg (1/2 tab) after lunch as needed for swelling   No facility-administered encounter medications on file as of 03/30/2021.    Patient Active Problem List   Diagnosis Date Noted   Normocytic anemia 12/21/2020   On amiodarone therapy 08/24/2020   Acute on chronic combined systolic and diastolic CHF (congestive heart failure) ([place]) 06/06/2020   PSVT (paroxysmal supraventricular tachycardia) (HCC)    AKI (acute kidney injury) ([place]) 05/26/2020   Aortic atherosclerosis ([place]) 05/25/2020   Acute on chronic respiratory failure with hypoxia ([place]) 05/24/2020   COPD exacerbation ([place]) 05/24/2020   Diabetes mellitus without complication ([place]) 10/93/2355   Acute CHF ([place]) 03/27/2020   Acute CHF (congestive heart  failure) ([place]) 03/27/2020   Acute respiratory failure with hypoxia (HCC)    Diabetes mellitus type 2, uncomplicated ([place]) 09/32/6712   COPD, mild ([place]) 09/17/2014   Clinical depression 09/17/2014   Elevated platelet count 09/17/2014   Elevated erythrocyte sedimentation rate 09/17/2014   History of tobacco use 09/17/2014   Hypercholesteremia 09/17/2014   BP (high blood pressure) 09/17/2014   Hypothyroidism 09/17/2014    Cannot sleep 09/17/2014   Lumbar disc disease with radiculopathy 09/17/2014   Arthritis, degenerative 09/17/2014   Excess weight 09/17/2014   Vertebrobasilar circulation transient ischemic attack 09/17/2014   Lung nodule, multiple 09/17/2014   Basilar artery insufficiency 09/17/2014   Neuritis or radiculitis due to rupture of lumbar intervertebral disc 08/25/2013   Degenerative arthritis of lumbar spine 08/25/2013   Lumbar canal stenosis 08/25/2013   Smoker 07/15/2012   Back pain 07/15/2012   SOB (shortness of breath) 07/15/2012   Hyperlipidemia 07/15/2012   Essential hypertension 07/15/2012    Patient Care Plan: RN Care Management Plan of Care     Problem Identified: CHF, COPD, DM, HTN      Long-Range Goal: Disease Progression Minimized or Managed Completed 03/30/2021  Priority: High  Note:   Current Barriers:  Chronic Disease Management support and education needs related to CHF, HTN, DM, and COPD   RNCM Clinical Goal(s):  Patient will demonstrate ongoing adherence to prescribed treatment plan for CHF, HTN, COPD, and DMI through collaboration with the provider, RN Care Manager and the care team.   Interventions: 1:1 collaboration with primary care provider regarding development and update of comprehensive plan of care as evidenced by provider attestation and co-signature Inter-disciplinary care team collaboration (see longitudinal plan of care) Evaluation of current treatment plan related to  self management and patient's adherence to plan as established by provider   Heart Failure Interventions:   Discussed current plan for CHF management. Reports excellent compliance with medications. Reviewed weight parameters. Reminded of need to notify a provider for weight gain greater than 3 lbs overnight or weight gain greater than 5 lbs within a week. Reports weights have been stable. Reviewed s/sx of fluid overload and indications for notifying a provider.  Reviewed nutritional  intake. Advised to continue monitoring sodium intake and avoid highly processed foods when possible. Reviewed worsening s/sx related to CHF exacerbation and indications for seeking immediate medical attention.  COPD Interventions:   Reviewed plan for COPD management. Reports symptoms have been well controlled. Reports using her nebulizer as recommended. Currently experiencing a nonproductive cough. Denies episodes of shortness of breath. Overall reports feeling well. Discussed importance of daily self-assessment. Advised to make an appointment with a provider if experiencing moderate symptoms for greater than 48 hours without improvement.  Reviewed information regarding infection prevention and increased risk for respiratory complications d/t COPD. Advised to utilize prevention strategies to reduce risk of respiratory infection. Reviewed worsening symptoms that require immediate medical attention.    Diabetes Interventions:   Reviewed plan for diabetes management. Reports taking medications as prescribed. Still prefers not to monitor blood glucose levels. Denies experiencing symptoms of hypoglycemia and hyperglycemia.  Reviewed nutritional intake. Reports good appetite. Attempting to adhere to a carb modified/diabetic diet. Advised to complete eye and foot exams as recommended. Reviewed available DM resources. Patient declined need for additional resources.    Hypertension Interventions:   Last practice recorded BP readings:  BP Readings from Last 3 Encounters:  02/20/21 120/60  02/01/21 130/67  01/20/21 130/64  Reviewed plan for BP management. Reports excellent compliance  with medications.  Reviewed established blood pressure parameters and indications for notifying a provider. Reviewed s/sx of heart attack, stroke and worsening symptoms that require immediate medical attention.   Patient Goals/Self-Care Activities: Take all medications as prescribed Attend all scheduled provider  appointments Call pharmacy for medication refills 3-7 days in advance of running out of medications Perform all self care activities independently  Call provider office for new concerns or questions           PLAN: Mrs. Linehan reports doing well and declines need for additional outreach. Agreed to call if her health needs change. The care management team will gladly assist.    [facility] Care Management [facility] [phone]

## 2021-04-15 ENCOUNTER — Other Ambulatory Visit: Payer: Self-pay | Admitting: Family Medicine

## 2021-04-15 NOTE — Telephone Encounter (Signed)
last RF12/18/22 #90 too early  Requested Prescriptions  Refused Prescriptions Disp Refills   omeprazole (PRILOSEC) 20 MG capsule [Pharmacy Med Name: OMEPRAZOLE 20MG  CAPSULES] 90 capsule 0    Sig: TAKE 1 CAPSULE(20 MG) BY MOUTH DAILY     Gastroenterology: Proton Pump Inhibitors Passed - 04/15/2021 11:42 AM      Passed - Valid encounter within last 12 months    Recent Outpatient Visits          2 months ago Pneumonia due to infectious organism, unspecified laterality, unspecified part of lung   [facility] Jerrol Banana., MD   3 months ago Multifocal pneumonia   [facility] Jerrol Banana., MD   6 months ago Type 2 diabetes mellitus with diabetic neuropathy, without long-term current use of insulin [facility])   [facility] Jerrol Banana., MD   9 months ago Type 2 diabetes mellitus without complication, without long-term current use of insulin [facility])   [facility] Jerrol Banana., MD   1 year ago Acute systolic congestive heart failure [facility])   [facility] Jerrol Banana., MD      Future Appointments            In 2 weeks Ralene Bathe, MD [place]   In 1 month Jerrol Banana., MD [facility] [place]   In 4 months Gollan, Kathlene November, MD [facility]

## 2021-04-22 ENCOUNTER — Other Ambulatory Visit: Payer: Self-pay | Admitting: Cardiovascular Disease

## 2021-04-24 ENCOUNTER — Other Ambulatory Visit: Payer: Self-pay

## 2021-04-24 ENCOUNTER — Encounter: Payer: Self-pay | Admitting: Family

## 2021-04-24 ENCOUNTER — Ambulatory Visit: Payer: Medicare Other | Attending: Family | Admitting: Family

## 2021-04-24 VITALS — BP 114/68 | HR 76 | Resp 18 | Ht 64.0 in | Wt 176.5 lb

## 2021-04-24 DIAGNOSIS — Z7984 Long term (current) use of oral hypoglycemic drugs: Secondary | ICD-10-CM | POA: Diagnosis not present

## 2021-04-24 DIAGNOSIS — Z8701 Personal history of pneumonia (recurrent): Secondary | ICD-10-CM | POA: Insufficient documentation

## 2021-04-24 DIAGNOSIS — M25511 Pain in right shoulder: Secondary | ICD-10-CM | POA: Diagnosis not present

## 2021-04-24 DIAGNOSIS — Z7901 Long term (current) use of anticoagulants: Secondary | ICD-10-CM | POA: Diagnosis not present

## 2021-04-24 DIAGNOSIS — I4891 Unspecified atrial fibrillation: Secondary | ICD-10-CM | POA: Diagnosis not present

## 2021-04-24 DIAGNOSIS — Z79899 Other long term (current) drug therapy: Secondary | ICD-10-CM | POA: Insufficient documentation

## 2021-04-24 DIAGNOSIS — M25512 Pain in left shoulder: Secondary | ICD-10-CM | POA: Insufficient documentation

## 2021-04-24 DIAGNOSIS — M549 Dorsalgia, unspecified: Secondary | ICD-10-CM | POA: Insufficient documentation

## 2021-04-24 DIAGNOSIS — I5022 Chronic systolic (congestive) heart failure: Secondary | ICD-10-CM | POA: Insufficient documentation

## 2021-04-24 DIAGNOSIS — E039 Hypothyroidism, unspecified: Secondary | ICD-10-CM | POA: Diagnosis not present

## 2021-04-24 DIAGNOSIS — I5042 Chronic combined systolic (congestive) and diastolic (congestive) heart failure: Secondary | ICD-10-CM

## 2021-04-24 DIAGNOSIS — I10 Essential (primary) hypertension: Secondary | ICD-10-CM | POA: Diagnosis not present

## 2021-04-24 DIAGNOSIS — E785 Hyperlipidemia, unspecified: Secondary | ICD-10-CM | POA: Insufficient documentation

## 2021-04-24 DIAGNOSIS — J449 Chronic obstructive pulmonary disease, unspecified: Secondary | ICD-10-CM | POA: Insufficient documentation

## 2021-04-24 DIAGNOSIS — E119 Type 2 diabetes mellitus without complications: Secondary | ICD-10-CM

## 2021-04-24 DIAGNOSIS — Z87891 Personal history of nicotine dependence: Secondary | ICD-10-CM | POA: Diagnosis not present

## 2021-04-24 DIAGNOSIS — I110 Hypertensive heart disease with heart failure: Secondary | ICD-10-CM | POA: Insufficient documentation

## 2021-04-24 DIAGNOSIS — E1151 Type 2 diabetes mellitus with diabetic peripheral angiopathy without gangrene: Secondary | ICD-10-CM | POA: Insufficient documentation

## 2021-04-24 DIAGNOSIS — I471 Supraventricular tachycardia: Secondary | ICD-10-CM | POA: Insufficient documentation

## 2021-04-24 DIAGNOSIS — R450 Nervousness: Secondary | ICD-10-CM | POA: Insufficient documentation

## 2021-04-24 DIAGNOSIS — Z8249 Family history of ischemic heart disease and other diseases of the circulatory system: Secondary | ICD-10-CM | POA: Insufficient documentation

## 2021-04-24 DIAGNOSIS — K259 Gastric ulcer, unspecified as acute or chronic, without hemorrhage or perforation: Secondary | ICD-10-CM | POA: Insufficient documentation

## 2021-04-24 NOTE — Progress Notes (Signed)
Patient ID: Kathryn [name], female    DOB: [birth year], 88 y.o.   MRN: [id number]  Ms. Havel is a 88 y/o female with a history of atrial fibrillation, DM, hyperlipidemia, HTN, thyroid disease, PVD, gastric ulcer, COPD, previous tobacco use and chronic heart failure.   Echo report from 03/28/20 reviewed and showed an EF of 40-45% along with mild LAE and mild MR/ AR.   Admitted 12/21/20 due to pneumonia where she was treated and discharged after 3 days.   She presents today for a follow-up visit with a chief complaint of minimal shortness of breath upon moderate exertion. She describes this as chronic in nature having been present for several years. She has associated fatigue, pedal edema, chronic pain, anxiety and difficulty sleeping along with this. She denies any abdominal distention, palpitations, chest pain, dizziness, cough or weight gain.   Past Medical History:  Diagnosis Date   Actinic keratosis    Aortic atherosclerosis ([place]) 05/25/2020   Arthritis    spinal stenosis   Cardiomyopathy - presumed to be nonischemic    a. 03/2020 Echo: EF 40-45%, gr2 DD. Nl RV fxn. Mildly dil LA. Mild MR/ao sclerosis; b. 03/2020 MV: EF 30-44%, no ischemia. Cor Ca2+ in pLAD.   CHF (congestive heart failure) (HCC)    COPD (chronic obstructive pulmonary disease) (HCC)    Coronary artery calcification seen on CT scan    a. 03/2020 MV: CT attenuation images show Ao and mod pLAD Ca2+.   Diabetes mellitus without complication ([place])    Gastric ulcer 06/2010   treated with Prolisec- states no problems now   Hyperlipidemia    Hypertension    PCP Dr Miguel Aschoff   [place]   Hypothyroidism    Left bundle branch block (LBBB)    Neuromuscular disorder (HCC)    slight numbness right toes- comes and goes   Peripheral vascular disease (HCC)    varicose veins left leg   Pneumonia    PSVT (paroxysmal supraventricular tachycardia) ([place])    a. 03/2020 Zio: Avg HR 87 (66-211).  4 beats NSVT.  43 PSVT episodes,  longest 10h 57m @ avg of 153 bpm (max 211).  Seen by EP-->amio started (pt wished to avoid EPS/RFCA).   Seasonal allergies    Shortness of breath    Skin cancer    multiple from face   Squamous cell carcinoma of skin 03/23/2013   L dorsal hand   Squamous cell carcinoma of skin 02/04/2014   R forearm/in situ, L pretibial   Squamous cell carcinoma of skin 09/24/2018   R thumb   Squamous cell carcinoma of skin 09/08/2019   L forearm - ED&C   Squamous cell carcinoma of skin 09/08/2019   R dosal hand - ED&C   Past Surgical History:  Procedure Laterality Date   BACK SURGERY     4/12  Dr Shellia Carwin- for lumbar stenosis   BREAST CYST ASPIRATION Left 1965   negative   [place]   lumpectomy with biopsy   left   CARPAL TUNNEL RELEASE     right   COLONOSCOPY     COLONOSCOPY WITH PROPOFOL N/A 06/30/2015   Procedure: COLONOSCOPY WITH PROPOFOL;  Surgeon: Hulen Luster, MD;  Location: [facility] ENDOSCOPY;  Service: Gastroenterology;  Laterality: N/A;   COLONOSCOPY WITH PROPOFOL N/A 01/22/2017   Procedure: COLONOSCOPY WITH PROPOFOL;  Surgeon: Lollie Sails, MD;  Location: [facility] ENDOSCOPY;  Service: Endoscopy;  Laterality: N/A;   COLONOSCOPY WITH PROPOFOL N/A 01/28/2019  Procedure: COLONOSCOPY WITH PROPOFOL;  Surgeon: Toledo, Benay Pike, MD;  Location: [facility] ENDOSCOPY;  Service: Gastroenterology;  Laterality: N/A;   ESOPHAGOGASTRODUODENOSCOPY     ESOPHAGOGASTRODUODENOSCOPY (EGD) WITH PROPOFOL N/A 01/28/2019   Procedure: ESOPHAGOGASTRODUODENOSCOPY (EGD) WITH PROPOFOL;  Surgeon: Toledo, Benay Pike, MD;  Location: [facility] ENDOSCOPY;  Service: Gastroenterology;  Laterality: N/A;   EYE SURGERY     cataract extraction with IOL bilaterally   JOINT REPLACEMENT     LUMBAR LAMINECTOMY/DECOMPRESSION MICRODISCECTOMY  05/24/2011   Procedure: LUMBAR LAMINECTOMY/DECOMPRESSION MICRODISCECTOMY 2 LEVELS;  Surgeon: Magnus Sinning, MD;  Location: [facility] ORS;  Service: Orthopedics;  Laterality: N/A;  L2-L3, L3-L4  (x-ray)   RIGHT OOPHORECTOMY     due to STAPH INFECTION   Family History  Problem Relation Age of Onset   Dementia Mother    Lung cancer Father    Heart disease Father    Hypertension Sister    Breast cancer Maternal Aunt    Arthritis Son    Social History   Tobacco Use   Smoking status: Former    Packs/day: 0.50    Years: 50.00    Pack years: 25.00    Types: Cigarettes    Quit date: 05/16/2004    Years since quitting: 16.9   Smokeless tobacco: Never  Substance Use Topics   Alcohol use: Yes    Comment: socially-1 glass of wine or [place] icea tea once a month   Allergies  Allergen Reactions   Oxycodone Other (See Comments)    Mental status change   Prednisone     swelling, bruising.   Prior to Admission medications   Medication Sig Start Date End Date Taking? Authorizing Provider  acetaminophen (TYLENOL) 500 MG tablet Take 1,000 mg by mouth every 6 (six) hours as needed. Pain    Yes [provider]  albuterol (PROVENTIL) (2.5 MG/3ML) 0.083% nebulizer solution Take 3 mLs (2.5 mg total) by nebulization every 6 (six) hours as needed for wheezing or shortness of breath. 04/05/20  Yes Jerrol Banana., MD  amiodarone (PACERONE) 200 MG tablet Take 1 tablet (200 mg) by mouth one daily as directed 05/11/20  Yes Vickie Epley, MD  aspirin EC 81 MG tablet Take 1 tablet (81 mg total) by mouth daily. Swallow whole. 04/18/20  Yes Visser, Jacquelyn D, PA-C  atorvastatin (LIPITOR) 40 MG tablet Take 1 tablet (40 mg total) by mouth daily. 04/18/20  Yes Minna Merritts, MD  carvedilol (COREG) 6.25 MG tablet Take 1 tablet (6.25 mg total) by mouth 2 (two) times daily with a meal. 04/27/20 04/22/21 Yes Visser, Jacquelyn D, PA-C  glucose blood (CONTOUR NEXT TEST) test strip Check sugar once daily  DX E11.9 11/16/16  Yes Jerrol Banana., MD  hydrOXYzine (ATARAX/VISTARIL) 10 MG tablet TAKE 1 TO 2 TABLETS(10 TO 20 MG) BY MOUTH EVERY 6 HOURS AS NEEDED 04/26/20  Yes Jerrol Banana., MD  levothyroxine (SYNTHROID) 100 MCG tablet TAKE 1 TABLET(100 MCG) BY MOUTH DAILY BEFORE BREAKFAST 08/17/19  Yes Jerrol Banana., MD  losartan (COZAAR) 25 MG tablet Take 1 tablet (25 mg total) by mouth every evening. 07/04/20  Yes Darylene Price A, FNP  metFORMIN (GLUCOPHAGE) 1000 MG tablet TAKE 1 TABLET(1000 MG) BY MOUTH DAILY WITH SUPPER 05/08/20  Yes Jerrol Banana., MD  omeprazole (PRILOSEC) 20 MG capsule TAKE 1 CAPSULE(20 MG) BY MOUTH DAILY 05/31/20  Yes Jerrol Banana., MD  tiotropium (SPIRIVA) 18 MCG inhalation capsule Place 1 capsule (  18 mcg total) into inhaler and inhale daily as needed. 04/05/20  Yes Jerrol Banana., MD  torsemide (DEMADEX) 20 MG tablet Take 20 mg po daily in AM. Take one extra dose 20mg  PRN 3lb weight gain or abdominal fullness. 07/18/20  Yes Minna Merritts, MD   Review of Systems  Constitutional:  Positive for fatigue. Negative for appetite change.  HENT:  Negative for congestion, rhinorrhea and sore throat.   Eyes: Negative.   Respiratory:  Positive for shortness of breath. Negative for cough and chest tightness.   Cardiovascular:  Positive for leg swelling. Negative for chest pain and palpitations.  Gastrointestinal:  Negative for abdominal distention and abdominal pain.  Endocrine: Negative.   Genitourinary: Negative.   Musculoskeletal:  Positive for arthralgias (shoulders) and back pain.  Skin: Negative.   Allergic/Immunologic: Negative.   Neurological:  Negative for dizziness and light-headedness.  Hematological:  Negative for adenopathy. Does not bruise/bleed easily.  Psychiatric/Behavioral:  Positive for sleep disturbance (sleeping on 1-2 pillows). Negative for dysphoric mood. The patient is nervous/anxious.    Vitals:   04/24/21 1306  BP: 114/68  Pulse: 76  Resp: 18  SpO2: 99%  Weight: 176 lb 8 oz (80.1 kg)  Height: 5\' 4"  (1.626 m)   Wt Readings from Last 3 Encounters:  04/24/21 176 lb 8 oz (80.1 kg)   02/20/21 171 lb 2 oz (77.6 kg)  01/20/21 172 lb (78 kg)   Lab Results  Component Value Date   CREATININE 1.19 (H) 01/20/2021   CREATININE 1.01 (H) 01/13/2021   CREATININE 1.00 01/11/2021    Physical Exam Vitals and nursing note reviewed. Exam conducted with a chaperone present (sister).  Constitutional:      Appearance: She is well-developed.  HENT:     Head: Normocephalic and atraumatic.  Neck:     Vascular: No JVD.  Cardiovascular:     Rate and Rhythm: Normal rate and regular rhythm.  Pulmonary:     Effort: Pulmonary effort is normal. No respiratory distress.     Breath sounds: No wheezing or rales.  Abdominal:     Palpations: Abdomen is soft.     Tenderness: There is no abdominal tenderness.  Musculoskeletal:     Cervical back: Neck supple.     Right lower leg: No tenderness. Edema (trace pitting) present.     Left lower leg: No tenderness. Edema (trace pitting) present.  Skin:    General: Skin is warm and dry.  Neurological:     General: No focal deficit present.     Mental Status: She is alert and oriented to person, place, and time.  Psychiatric:        Mood and Affect: Mood normal.        Behavior: Behavior normal.   Assessment & Plan:  1: Chronic heart failure with reduced ejection fraction- - NYHA class II - euvolemic today - weighing daily and reminded to call for an overnight weight gain of > 2 pounds or a weekly weight gain of >5 pounds - weight up 2 pounds from last visit here in May 2022 - not adding much salt now; encouraged her to continue to read food labels for sodium content and keep daily sodium content to < 2000mg  / day - saw [facility] cardiology Rockey Situ) 11/22, he increased her torsemide to 40 mg QD d/t pedal edema - on GDMT of carvedilol, losartan, farxiga - K is 5.0, cannot add MRA at this time - not wearing compression socks daily, advised to  wear every day to help with pedal edema - BNP 01/20/21 was 170.3 - participating in paramedicine  program  2: HTN- - BP looks good today (114/68) - will see PCP Rosanna Randy) 3/23 - BMP 01/20/21 reviewed and showed sodium 142, potassium 5.0, creatinine 1.19 and GFR 45  3: Diabetes- - A1c 12/23/20 was 7.8%  4: PSVT- - saw EP Quentin Ore) 05/11/20 due to SVT on zio monitor - continues on amiodarone  - RRR today  - possible ablation in the future if needed   Patient did not bring her medications nor a list. Each medication was verbally reviewed with the patient and she was encouraged to bring the bottles to every visit to confirm accuracy of list.  Return in 6 months or sooner for any questions/problems before then.

## 2021-04-24 NOTE — Patient Instructions (Addendum)
The [facility] will be moving around the corner to [address] mid-February. Our phone number will remain the same.  Continue to weigh daily.  Call HF clinic if you notice weight gain > 3 lbs/day or 5 lb/week.  Get some compression socks at the pharmacy. Put on in am and take off when you go to bed.   Return in  6 months.  Keep up the good work!

## 2021-04-25 DIAGNOSIS — E119 Type 2 diabetes mellitus without complications: Secondary | ICD-10-CM

## 2021-04-25 DIAGNOSIS — I10 Essential (primary) hypertension: Secondary | ICD-10-CM | POA: Diagnosis not present

## 2021-04-25 DIAGNOSIS — J449 Chronic obstructive pulmonary disease, unspecified: Secondary | ICD-10-CM | POA: Diagnosis not present

## 2021-04-25 DIAGNOSIS — I509 Heart failure, unspecified: Secondary | ICD-10-CM

## 2021-05-03 ENCOUNTER — Ambulatory Visit (INDEPENDENT_AMBULATORY_CARE_PROVIDER_SITE_OTHER): Payer: Medicare Other | Admitting: Dermatology

## 2021-05-03 ENCOUNTER — Other Ambulatory Visit: Payer: Self-pay

## 2021-05-03 DIAGNOSIS — L570 Actinic keratosis: Secondary | ICD-10-CM | POA: Diagnosis not present

## 2021-05-03 DIAGNOSIS — L578 Other skin changes due to chronic exposure to nonionizing radiation: Secondary | ICD-10-CM

## 2021-05-03 DIAGNOSIS — L720 Epidermal cyst: Secondary | ICD-10-CM

## 2021-05-03 MED ORDER — FLUOROURACIL 5 % EX CREA: TOPICAL_CREAM | Freq: Two times a day (BID) | CUTANEOUS | 1 refills | Status: DC

## 2021-05-03 NOTE — Progress Notes (Signed)
Follow-Up Visit   Subjective  Kathryn [name] is a 88 y.o. female who presents for the following: Actinic Keratosis (Face, neck arms, hands, 80m f/u), ISKs (Face, neck arms, hands, 54m f/u), and new spot (R face, just noticed, picks at). The patient has spots, moles and lesions to be evaluated, some may be new or changing and the patient has concerns that these could be cancer.  The following portions of the chart were reviewed this encounter and updated as appropriate:   Tobacco   Allergies   Meds   Problems   Med Hx   Surg Hx   Fam Hx      Review of Systems:  No other skin or systemic complaints except as noted in HPI or Assessment and Plan.  Objective  Well appearing patient in no apparent distress; mood and affect are within normal limits.  A focused examination was performed including face, neck, arms, hands. Relevant physical exam findings are noted in the Assessment and Plan.  bil temples, cheeks , nose x 16 (16) Pink scaly macules   R cheek preauricular, L ant neck Cystic pap 1.0cm R cheek, Cystic pap 1.0cm L ant neck   Assessment & Plan   Actinic Damage - Severe, confluent actinic changes with pre-cancerous actinic keratoses  - Severe, chronic, not at goal, secondary to cumulative UV radiation exposure over time - diffuse scaly erythematous macules and papules with underlying dyspigmentation - Discussed Prescription "Field Treatment" for Severe, Chronic Confluent Actinic Changes with Pre-Cancerous Actinic Keratoses Field treatment involves treatment of an entire area of skin that has confluent Actinic Changes (Sun/ Ultraviolet light damage) and PreCancerous Actinic Keratoses by method of PhotoDynamic Therapy (PDT) and/or prescription Topical Chemotherapy agents such as 5-fluorouracil, 5-fluorouracil/calcipotriene, and/or imiquimod.  The purpose is to decrease the number of clinically evident and subclinical PreCancerous lesions to prevent progression to development of skin  cancer by chemically destroying early precancer changes that may or may not be visible.  It has been shown to reduce the risk of developing skin cancer in the treated area. As a result of treatment, redness, scaling, crusting, and open sores may occur during treatment course. One or more than one of these methods may be used and may have to be used several times to control, suppress and eliminate the PreCancerous changes. Discussed treatment course, expected reaction, and possible side effects. - Recommend daily broad spectrum sunscreen SPF 30+ to sun-exposed areas, reapply every 2 hours as needed.  - Staying in the shade or wearing long sleeves, sun glasses (UVA+UVB protection) and wide brim hats (4-inch brim around the entire circumference of the hat) are also recommended. - Call for new or changing lesions.  -Start 5-fluorouracil/calcipotriene cream twice a day for 7 days to affected areas including bilateral temples. Prescription sent to [facility]. Patient provided with contact information for pharmacy and advised the pharmacy will mail the prescription to their home. Patient provided with handout reviewing treatment course and side effects and advised to call or message [place] on MyChart with any concerns.   AK (actinic keratosis) (16) bil temples, cheeks , nose x 16  Destruction of lesion - bil temples, cheeks , nose x 16 Complexity: simple   Destruction method: cryotherapy   Informed consent: discussed and consent obtained   Timeout:  patient name, date of birth, surgical site, and procedure verified Lesion destroyed using liquid nitrogen: Yes   Region frozen until ice ball extended beyond lesion: Yes   Outcome: patient tolerated procedure well  with no complications   Post-procedure details: wound care instructions given    fluorouracil (EFUDEX) 5 % cream - bil temples, cheeks , nose x 16 Apply topically 2 (two) times daily. Thin coat bid to bilateral temples for 7 days  Epidermal  cyst R cheek preauricular, L ant neck Irritated Discussed excising, pt will schedule for 2 surgeries Benign-appearing. Exam most consistent with an epidermal inclusion cyst. Discussed that a cyst is a benign growth that can grow over time and sometimes get irritated or inflamed. Recommend observation if it is not bothersome. Discussed option of surgical excision to remove it if it is growing, symptomatic, or other changes noted. Please call for new or changing lesions so they can be evaluated.  Return for to be scheduled for 2 surgeries for 2 cyst.  I, Othelia Pulling, RMA, am acting as scribe for Sarina Ser, MD . Documentation: I have reviewed the above documentation for accuracy and completeness, and I agree with the above.  Sarina Ser, MD

## 2021-05-03 NOTE — Patient Instructions (Addendum)
If You Need Anything After Your Visit ° °If you have any questions or concerns for your doctor, please call our main line at [phone] and press option 4 to reach your doctor's medical assistant. If no one answers, please leave a voicemail as directed and we will return your call as soon as possible. Messages left after 4 pm will be answered the following business day.  ° °You may also send us a message via MyChart. We typically respond to MyChart messages within 1-2 business days. ° °For prescription refills, please ask your pharmacy to contact our office. Our fax number is [phone]. ° °If you have an urgent issue when the clinic is closed that cannot wait until the next business day, you can page your doctor at the number below.   ° °Please note that while we do our best to be available for urgent issues outside of office hours, we are not available 24/7.  ° °If you have an urgent issue and are unable to reach us, you may choose to seek medical care at your doctor's office, retail clinic, urgent care center, or emergency room. ° °If you have a medical emergency, please immediately call 911 or go to the emergency department. ° °Pager Numbers ° °- Dr. Kowalski: [phone] ° °- Dr. Moye: [phone] ° °- Dr. Stewart: [phone] ° °In the event of inclement weather, please call our main line at [phone] for an update on the status of any delays or closures. ° °Dermatology Medication Tips: °Please keep the boxes that topical medications come in in order to help keep track of the instructions about where and how to use these. Pharmacies typically print the medication instructions only on the boxes and not directly on the medication tubes.  ° °If your medication is too expensive, please contact our office at [phone] option 4 or send us a message through MyChart.  ° °We are unable to tell what your co-pay for medications will be in advance as this is different depending on your insurance coverage.  However, we may be able to find a substitute medication at lower cost or fill out paperwork to get insurance to cover a needed medication.  ° °If a prior authorization is required to get your medication covered by your insurance company, please allow us 1-2 business days to complete this process. ° °Drug prices often vary depending on where the prescription is filled and some pharmacies may offer cheaper prices. ° °The website www.goodrx.com contains coupons for medications through different pharmacies. The prices here do not account for what the cost may be with help from insurance (it may be cheaper with your insurance), but the website can give you the price if you did not use any insurance.  °- You can print the associated coupon and take it with your prescription to the pharmacy.  °- You may also stop by our office during regular business hours and pick up a GoodRx coupon card.  °- If you need your prescription sent electronically to a different pharmacy, notify our office through [facility] MyChart or by phone at [phone] option 4. ° ° ° ° °Si Usted Necesita Algo Después de Su Visita ° °También puede enviarnos un mensaje a través de MyChart. Por lo general respondemos a los mensajes de MyChart en el transcurso de 1 a 2 días hábiles. ° °Para renovar recetas, por favor pida a su farmacia que se ponga en contacto con nuestra oficina. Nuestro número de fax es el [phone]. ° °Si tiene   un asunto urgente cuando la clnica est cerrada y que no puede esperar hasta el siguiente da hbil, puede llamar/localizar a su doctor(a) al nmero que aparece a continuacin.   Por favor, tenga en cuenta que aunque hacemos todo lo posible para estar disponibles para asuntos urgentes fuera del horario de [place], no estamos disponibles las 24 horas del da, los 7 das de la [place].   Si tiene un problema urgente y no puede comunicarse con nosotros, puede optar por buscar atencin mdica  en el consultorio de su  doctor(a), en una clnica privada, en un centro de atencin urgente o en una sala de emergencias.  Si tiene Engineering geologist, por favor llame inmediatamente al 911 o vaya a la sala de emergencias.  Nmeros de bper  - Dr. Nehemiah Massed: [phone]  - Dra. Moye: [phone]  - Dra. Nicole Kindred: [phone]  En caso de inclemencias del [place], por favor llame a Johnsie Kindred principal al [phone] para una actualizacin sobre el [place] de cualquier retraso o cierre.  Consejos para la medicacin en dermatologa: Por favor, guarde las cajas en las que vienen los medicamentos de uso tpico para ayudarle a seguir las instrucciones sobre dnde y cmo usarlos. Las farmacias generalmente imprimen las instrucciones del medicamento slo en las cajas y no directamente en los tubos del [place].   Si su medicamento es muy caro, por favor, pngase en contacto con Zigmund Daniel llamando al [phone] y presione la opcin 4 o envenos un mensaje a travs de Pharmacist, community.   No podemos decirle cul ser su copago por los medicamentos por adelantado ya que esto es diferente dependiendo de la cobertura de su seguro. Sin embargo, es posible que podamos encontrar un medicamento sustituto a Electrical engineer un formulario para que el seguro cubra el medicamento que se considera necesario.   Si se requiere una autorizacin previa para que su compaa de seguros [place] su medicamento, por favor permtanos de 1 a 2 das hbiles para completar este proceso.  Los precios de los medicamentos varan con frecuencia dependiendo del Environmental consultant de dnde se surte la receta y alguna farmacias pueden ofrecer precios ms baratos.  El sitio web www.goodrx.com tiene cupones para medicamentos de Airline pilot. Los precios aqu no tienen en cuenta lo que podra costar con la ayuda del seguro (puede ser ms barato con su seguro), pero el sitio web puede darle el precio si no utiliz Research scientist (physical sciences).  - Puede imprimir el cupn  correspondiente y llevarlo con su receta a la farmacia.  - Tambin puede pasar por nuestra oficina durante el horario de atencin regular y Charity fundraiser una tarjeta de cupones de GoodRx.  - Si necesita que su receta se enve electrnicamente a Chiropodist, informe a nuestra oficina a travs de MyChart de [place] o por telfono llamando al [phone] y presione la opcin 4.   -Start 5-fluorouracil/calcipotriene cream twice a day for 7 days to affected areas including bilateral temples. Prescription [facility] to [facility]  5-Fluorouracil/Calcipotriene Patient Education   Actinic keratoses are the dry, red scaly spots on the skin caused by sun damage. A portion of these spots can turn into skin cancer with time, and treating them can help prevent development of skin cancer.   Treatment of these spots requires removal of the defective skin cells. There are various ways to remove actinic keratoses, including freezing with liquid nitrogen, treatment with creams, or treatment with a blue light procedure in the office.   5-fluorouracil cream is  a topical cream used to treat actinic keratoses. It works by interfering with the growth of abnormal fast-growing skin cells, such as actinic keratoses. These cells peel off and are replaced by healthy ones.   5-fluorouracil/calcipotriene is a combination of the 5-fluorouracil cream with a vitamin D analog cream called calcipotriene. The calcipotriene alone does not treat actinic keratoses. However, when it is combined with 5-fluorouracil, it helps the 5-fluorouracil treat the actinic keratoses much faster so that the same results can be achieved with a much shorter treatment time.  INSTRUCTIONS FOR 5-FLUOROURACIL/CALCIPOTRIENE CREAM:   5-fluorouracil/calcipotriene cream typically only needs to be used for 4-7 days. A thin layer should be applied twice a day to the treatment areas recommended by your physician.   If your physician prescribed you  separate tubes of 5-fluourouracil and calcipotriene, apply a thin layer of 5-fluorouracil followed by a thin layer of calcipotriene.   Avoid contact with your eyes, nostrils, and mouth. Do not use 5-fluorouracil/calcipotriene cream on infected or open wounds.   You will develop redness, irritation and some crusting at areas where you have pre-cancer damage/actinic keratoses. IF YOU DEVELOP PAIN, BLEEDING, OR SIGNIFICANT CRUSTING, STOP THE TREATMENT EARLY - you have already gotten a good response and the actinic keratoses should clear up well.  Wash your hands after applying 5-fluorouracil 5% cream on your skin.   A moisturizer or sunscreen with a minimum SPF 30 should be applied each morning.   Once you have finished the treatment, you can apply a thin layer of Vaseline twice a day to irritated areas to soothe and calm the areas more quickly. If you experience significant discomfort, contact your physician.  For some patients it is necessary to repeat the treatment for best results.  SIDE EFFECTS: When using 5-fluorouracil/calcipotriene cream, you may have mild irritation, such as redness, dryness, swelling, or a mild burning sensation. This usually resolves within 2 weeks. The more actinic keratoses you have, the more redness and inflammation you can expect during treatment. Eye irritation has been reported rarely. If this occurs, please let us know.   If you have any trouble using this cream, please call the office. If you have any other questions about this information, please do not hesitate to ask me before you leave the office.      Pre-Operative Instructions  You are scheduled for a surgical procedure at [facility]. We recommend you read the following instructions. If you have any questions or concerns, please call the office at [phone].  Shower and wash the entire body with soap and water the day of your surgery paying special attention to cleansing at and around the  planned surgery site.  Avoid aspirin or aspirin containing products at least fourteen (14) days prior to your surgical procedure and for at least one week (7 Days) after your surgical procedure. If you take aspirin on a regular basis for heart disease or history of stroke or for any other reason, we may recommend you continue taking aspirin but please notify us if you take this on a regular basis. Aspirin can cause more bleeding to occur during surgery as well as prolonged bleeding and bruising after surgery.   Avoid other nonsteroidal pain medications at least one week prior to surgery and at least one week prior to your surgery. These include medications such as Ibuprofen (Motrin, Advil and Nuprin), Naprosyn, Voltaren, Relafen, etc. If medications are used for therapeutic reasons, please inform us as they can cause increased bleeding or prolonged  bleeding during and bruising after surgical procedures.   Please advise [place] if you are taking any "blood thinner" medications such as Coumadin or Dipyridamole or Plavix or similar medications. These cause increased bleeding and prolonged bleeding during procedures and bruising after surgical procedures. We may have to consider discontinuing these medications briefly prior to and shortly after your surgery if safe to do so.   Please inform us of all medications you are currently taking. All medications that are taken regularly should be taken the day of surgery as you always do. Nevertheless, we need to be informed of what medications you are taking prior to surgery to know whether they will affect the procedure or cause any complications.   Please inform us of any medication allergies. Also inform us of whether you have allergies to Latex or rubber products or whether you have had any adverse reaction to Lidocaine or Epinephrine.  Please inform us of any prosthetic or artificial body parts such as artificial heart valve, joint replacements, etc., or similar  condition that might require preoperative antibiotics.   We recommend avoidance of alcohol at least two weeks prior to surgery and continued avoidance for at least two weeks after surgery.   We recommend discontinuation of tobacco smoking at least two weeks prior to surgery and continued abstinence for at least two weeks after surgery.  Do not plan strenuous exercise, strenuous work or strenuous lifting for approximately four weeks after your surgery.   We request if you are unable to make your scheduled surgical appointment, please call us at least a week in advance or as soon as you are aware of a problem so that we can cancel or reschedule the appointment.   You MAY TAKE TYLENOL (acetaminophen) for pain as it is not a blood thinner.   PLEASE PLAN TO BE IN TOWN FOR TWO WEEKS FOLLOWING SURGERY, THIS IS IMPORTANT SO YOU CAN BE CHECKED FOR DRESSING CHANGES, SUTURE REMOVAL AND TO MONITOR FOR POSSIBLE COMPLICATIONS.

## 2021-05-07 ENCOUNTER — Encounter: Payer: Self-pay | Admitting: Dermatology

## 2021-05-11 ENCOUNTER — Other Ambulatory Visit: Payer: Self-pay | Admitting: Family Medicine

## 2021-05-11 NOTE — Telephone Encounter (Signed)
Requested Prescriptions  Pending Prescriptions Disp Refills   hydrOXYzine (ATARAX) 10 MG tablet [Pharmacy Med Name: HYDROXYZINE HCL 10MG  TABLETS] 100 tablet 1    Sig: TAKE 1 TO 2 TABLETS(10 TO 20 MG) BY MOUTH EVERY 6 HOURS AS NEEDED     Ear, Nose, and Throat:  Antihistamines 2 Failed - 05/11/2021  9:54 AM      Failed - Cr in normal range and within 360 days    Creatinine  Date Value Ref Range Status  07/09/2014 0.85 mg/dL Final    Comment:    0.44-1.00 NOTE: New Reference Range  06/01/14    Creatinine, Ser  Date Value Ref Range Status  01/20/2021 1.19 (H) 0.57 - 1.00 mg/dL Final         Passed - Valid encounter within last 12 months    Recent Outpatient Visits          3 months ago Pneumonia due to infectious organism, unspecified laterality, unspecified part of lung   [facility] Jerrol Banana., MD   4 months ago Multifocal pneumonia   [facility] Jerrol Banana., MD   7 months ago Type 2 diabetes mellitus with diabetic neuropathy, without long-term current use of insulin [facility])   [facility] Jerrol Banana., MD   10 months ago Type 2 diabetes mellitus without complication, without long-term current use of insulin [facility])   [facility] Jerrol Banana., MD   1 year ago Acute systolic congestive heart failure [facility])   [facility] Jerrol Banana., MD      Future Appointments            In 4 weeks Jerrol Banana., MD [facility], [facility]   In 3 months Gollan, Kathlene November, MD [place], [facility]            omeprazole (PRILOSEC) 20 MG capsule [Pharmacy Med Name: OMEPRAZOLE 20MG  CAPSULES] 90 capsule 0    Sig: TAKE 1 CAPSULE(20 MG) BY MOUTH DAILY     Gastroenterology: Proton Pump Inhibitors Passed - 05/11/2021  9:54 AM      Passed - Valid encounter within last 12 months    Recent Outpatient Visits          3 months ago Pneumonia  due to infectious organism, unspecified laterality, unspecified part of lung   [facility] Jerrol Banana., MD   4 months ago Multifocal pneumonia   [facility] Jerrol Banana., MD   7 months ago Type 2 diabetes mellitus with diabetic neuropathy, without long-term current use of insulin [facility])   [facility] Jerrol Banana., MD   10 months ago Type 2 diabetes mellitus without complication, without long-term current use of insulin [facility])   [facility] Jerrol Banana., MD   1 year ago Acute systolic congestive heart failure [facility])   [facility] Jerrol Banana., MD      Future Appointments            In 4 weeks Jerrol Banana., MD [facility], [place]   In 3 months Gollan, Kathlene November, MD [facility], [facility]

## 2021-05-11 NOTE — Telephone Encounter (Signed)
Requesting Prilosec too early, filled 04/15/21 for 90 day supply, refused. Filled Atarax to get to upcoming appt in March. Requested Prescriptions  Pending Prescriptions Disp Refills   hydrOXYzine (ATARAX) 10 MG tablet [Pharmacy Med Name: HYDROXYZINE HCL 10MG  TABLETS] 100 tablet 1    Sig: TAKE 1 TO 2 TABLETS(10 TO 20 MG) BY MOUTH EVERY 6 HOURS AS NEEDED     Ear, Nose, and Throat:  Antihistamines 2 Failed - 05/11/2021  9:54 AM      Failed - Cr in normal range and within 360 days    Creatinine  Date Value Ref Range Status  07/09/2014 0.85 mg/dL Final    Comment:    0.44-1.00 NOTE: New Reference Range  06/01/14    Creatinine, Ser  Date Value Ref Range Status  01/20/2021 1.19 (H) 0.57 - 1.00 mg/dL Final          Passed - Valid encounter within last 12 months    Recent Outpatient Visits           3 months ago Pneumonia due to infectious organism, unspecified laterality, unspecified part of lung   [facility] Jerrol [facility]   4 months ago Multifocal pneumonia   [facility]rol Banana., MD   7 months ago Type 2 diabetes mellitus with diabetic neuropathy, without long-term current use of insulin [facility])   [facility] Jerrol Banana., MD   10 months ago Type 2 diabetes mellitus without complication, without long-term current use of insulin [facility])   [facility] Jerrol Banana., MD   1 year ago Acute systolic congestive heart failure [facility])   [facility] Jerrol Banana., MD       Future Appointments             In 4 weeks Jerrol Banana., MD [facility], [facility]   In 3 months Gollan, Kathlene November, MD [place], [facility]             omeprazole (PRILOSEC) 20 MG capsule [Pharmacy Med Name: OMEPRAZOLE 20MG  CAPSULES] 90 capsule 0    Sig: TAKE 1 CAPSULE(20 MG) BY MOUTH DAILY     Gastroenterology: Proton Pump Inhibitors Passed -  05/11/2021  9:54 AM      Passed - Valid encounter within last 12 months    Recent Outpatient Visits           3 months ago Pneumonia due to infectious organism, unspecified laterality, unspecified part of lung   [facility] Jerrol Banana., MD   4 months ago Multifocal pneumonia   [facility] Jerrol Banana., MD   7 months ago Type 2 diabetes mellitus with diabetic neuropathy, without long-term current use of insulin [facility])   [facility] Jerrol Banana., MD   10 months ago Type 2 diabetes mellitus without complication, without long-term current use of insulin [facility])   [facility] Jerrol Banana., MD   1 year ago Acute systolic congestive heart failure [facility])   [facility] Jerrol Banana., MD       Future Appointments             In 4 weeks Jerrol Banana., MD [facility], [place]   In 3 months Gollan, Kathlene November, MD [facility], [facility]

## 2021-05-11 NOTE — Telephone Encounter (Signed)
Requesting too soon.  Requested Prescriptions  Pending Prescriptions Disp Refills   omeprazole (PRILOSEC) 20 MG capsule [Pharmacy Med Name: OMEPRAZOLE 20MG  CAPSULES] 90 capsule 0    Sig: TAKE 1 CAPSULE(20 MG) BY MOUTH DAILY     Gastroenterology: Proton Pump Inhibitors Passed - 05/11/2021  9:54 AM      Passed - Valid encounter within last 12 months    Recent Outpatient Visits          3 months ago Pneumonia due to infectious organism, unspecified laterality, unspecified part of lung   [facility] Jerrol Banana., MD   4 months ago Multifocal pneumonia   [facility] Ban[facility] MD   7 months ago Type 2 diabetes mellitus with diabetic neuropathy, without long-term current use of insulin ([place])   [facility] Jerrol Banana., MD   10 months ago Type 2 diabetes mellitus without complication, without long-term current use of insulin ([place])   [facility] Jerrol Banana., MD   1 year ago Acute systolic congestive heart failure [facility])   [facility] Jerrol Banana., MD      Future Appointments            In 4 weeks Jerrol Banana., MD [facility], [facility]   In 3 months Gollan, Kathlene November, MD [facility], [facility]           Signed Prescriptions Disp Refills   hydrOXYzine (ATARAX) 10 MG tablet 100 tablet 0    Sig: TAKE 1 TO 2 TABLETS(10 TO 20 MG) BY MOUTH EVERY 6 HOURS AS NEEDED     Ear, Nose, and Throat:  Antihistamines 2 Failed - 05/11/2021  9:54 AM      Failed - Cr in normal range and within 360 days    Creatinine  Date Value Ref Range Status  07/09/2014 0.85 mg/dL Final    Comment:    0.44-1.00 NOTE: New Reference Range  06/01/14    Creatinine, Ser  Date Value Ref Range Status  01/20/2021 1.19 (H) 0.57 - 1.00 mg/dL Final         Passed - Valid encounter within last 12 months    Recent Outpatient Visits          3 months ago  Pneumonia due to infectious organism, unspecified laterality, unspecified part of lung   [facility] Jerrol Banana[facility] MD   4 months ago Multifocal pneumonia   [facility] Jerrol Banana., MD   7 months ago Type 2 diabetes mellitus with diabetic neuropathy, without long-term current use of insulin [facility])   [facility] Jerrol Banana., MD   10 months ago Type 2 diabetes mellitus without complication, without long-term current use of insulin [facility])   [facility] Jerrol Banana., MD   1 year ago Acute systolic congestive heart failure [facility])   [facility] Jerrol Banana., MD      Future Appointments            In 4 weeks Jerrol Banana., MD [facility], [place]   In 3 months Gollan, Kathlene November, MD [facility], [facility]

## 2021-05-15 DIAGNOSIS — M25511 Pain in right shoulder: Secondary | ICD-10-CM | POA: Diagnosis not present

## 2021-05-15 DIAGNOSIS — M25512 Pain in left shoulder: Secondary | ICD-10-CM | POA: Diagnosis not present

## 2021-05-15 DIAGNOSIS — M19011 Primary osteoarthritis, right shoulder: Secondary | ICD-10-CM | POA: Diagnosis not present

## 2021-05-15 DIAGNOSIS — M19012 Primary osteoarthritis, left shoulder: Secondary | ICD-10-CM | POA: Diagnosis not present

## 2021-05-22 ENCOUNTER — Other Ambulatory Visit (HOSPITAL_COMMUNITY): Payer: Self-pay

## 2021-05-22 NOTE — Progress Notes (Signed)
Kathryn [name] today states she is doing good.  She states doing stuff in the home, she is looking forward to sunshine and warmer weather.  She still having pain in back that goes down her leg.  Her shoulder still bothering her.  She does not want any surgery but would like some relief.  She has a PCP appt coming up and she keeps asking if there is anything she could take to help the sciatic nerve pain, she does not want to take opoids.  When she goes out she goes by wheelchair.  She denies any chest pain, headaches, dizziness or increased shortness of breath.  She is aware of up coming appts.  She has all her medications and aware of how to take them.   She is aware of calling with any problems.  Will continue to visit with Heart falure, diet and medication.   [place] [phone]

## 2021-05-25 ENCOUNTER — Other Ambulatory Visit: Payer: Self-pay | Admitting: Family Medicine

## 2021-05-25 NOTE — Telephone Encounter (Signed)
Requested Prescriptions  ?Pending Prescriptions Disp Refills  ?? SPIRIVA HANDIHALER 18 MCG inhalation capsule [Pharmacy Med Name: SPIRIVA 18MCG CAPS 30S &HANDIHALER] 30 capsule 12  ?  Sig: PLACE 1 CAPSULE INTO INHALER AND INHALE EVERY DAY AS NEEDED  ?  ? Pulmonology:  Anticholinergic Agents Passed - 05/25/2021 11:12 AM  ?  ?  Passed - Valid encounter within last 12 months  ?  Recent Outpatient Visits   ?      ? 3 months ago Pneumonia due to infectious organism, unspecified laterality, unspecified part of lung  ? [facility] Jerrol Banana., MD  ? 4 months ago Multifocal pneumonia  ? [facility] Jerrol Banana., MD  ? 7 months ago Type 2 diabetes mellitus with diabetic neuropathy, without long-term current use of insulin ([place])  ? [facility] Jerrol Banana., MD  ? 10 months ago Type 2 diabetes mellitus without complication, without long-term current use of insulin ([place])  ? [facility] Jerrol Banana., MD  ? 1 year ago Acute systolic congestive heart failure [facility])  ? [facility] Jerrol Banana., MD  ?  ?  ?Future Appointments   ?        ? In 2 weeks Jerrol Banana., MD [facility]  ? In 3 months Gollan, Kathlene November, MD [facility]  ?  ? ?  ?  ?  ? ? ?

## 2021-06-08 ENCOUNTER — Encounter: Payer: Self-pay | Admitting: Family Medicine

## 2021-06-08 ENCOUNTER — Other Ambulatory Visit: Payer: Self-pay

## 2021-06-08 ENCOUNTER — Ambulatory Visit (INDEPENDENT_AMBULATORY_CARE_PROVIDER_SITE_OTHER): Payer: Medicare Other | Admitting: Family Medicine

## 2021-06-08 ENCOUNTER — Telehealth: Payer: Self-pay | Admitting: *Deleted

## 2021-06-08 VITALS — BP 121/73 | HR 69 | Temp 98.4°F | Resp 18 | Ht 64.0 in | Wt 181.0 lb

## 2021-06-08 DIAGNOSIS — J449 Chronic obstructive pulmonary disease, unspecified: Secondary | ICD-10-CM | POA: Diagnosis not present

## 2021-06-08 DIAGNOSIS — M4726 Other spondylosis with radiculopathy, lumbar region: Secondary | ICD-10-CM

## 2021-06-08 DIAGNOSIS — E119 Type 2 diabetes mellitus without complications: Secondary | ICD-10-CM | POA: Diagnosis not present

## 2021-06-08 DIAGNOSIS — M48061 Spinal stenosis, lumbar region without neurogenic claudication: Secondary | ICD-10-CM

## 2021-06-08 DIAGNOSIS — I10 Essential (primary) hypertension: Secondary | ICD-10-CM | POA: Diagnosis not present

## 2021-06-08 DIAGNOSIS — I509 Heart failure, unspecified: Secondary | ICD-10-CM | POA: Diagnosis not present

## 2021-06-08 NOTE — Chronic Care Management (AMB) (Signed)
?  Chronic Care Management  ? ?Note ? ?06/08/2021 ?Name: [name] MRN: [id number] DOB: 05-14-1933 ? ?Kathryn [name] is a 88 y.o. year old female who is a primary care patient of Jerrol Banana., MD. Thom Chimes is currently enrolled in care management services. An additional referral for Pharm D was placed.  ? ?Follow up plan: ?Telephone appointment with care management team member scheduled for: 05/16/2021 ? ?Janice Seales, CCMA ?Care Guide, Embedded Care Coordination ?[place]  Care Management  ?Direct Dial: [phone] ? ? ?

## 2021-06-08 NOTE — Progress Notes (Signed)
?  ? ? ?Established patient visit ? ?I,[name],acting as a scribe for [name], MD.,have documented all relevant documentation on the behalf of [name], MD,as directed by  [name], MD while in the presence of [name], MD. ? ? ?Patient: Kathryn [name]   DOB: Feb 08, 1934   88 y.o. Female  MRN: [id number] ?Visit Date: 06/08/2021 ? ?Today's healthcare provider: [name], MD  ? ?Chief Complaint  ?Patient presents with  ? Follow-up  ? Diabetes  ? Hypertension  ? Hyperlipidemia  ? ?Subjective  ?  ?HPI  ?Diabetes Mellitus Type II, follow-up ? ?Lab Results  ?Component Value Date  ? HGBA1C 7.8 (H) 12/23/2020  ? HGBA1C 7.6 (H) 12/22/2020  ? HGBA1C 7.2 (H) 10/03/2020  ? ?Last seen for diabetes 6 months ago.  ?Management since then includes continuing the same treatment. ?She reports good compliance with treatment. ?She is not having side effects. none ? ?Home blood sugar records: fasting range: not checking ? ?Episodes of hypoglycemia? No none ?  ?Current insulin regiment: n/a ?Most Recent Eye Exam: 12/28/2020 ? ?--------------------------------------------------------------------------------------------------- ?Hypertension, follow-up ? ?BP Readings from Last 3 Encounters:  ?06/08/21 121/73  ?04/24/21 114/68  ?02/20/21 120/60  ? Wt Readings from Last 3 Encounters:  ?06/08/21 181 lb (82.1 kg)  ?05/22/21 176 lb (79.8 kg)  ?04/24/21 176 lb 8 oz (80.1 kg)  ?  ? ?She was last seen for hypertension 4 months ago.  ?BP at that visit was 120/60. ?Management since that visit includes; good control. ?She reports good compliance with treatment. ?She is not having side effects. none ?She is not exercising. ?She is adherent to low salt diet.   ?Outside blood pressures are not checking. ? ?She does not smoke. ? ?Use of agents associated with hypertension: none.   ? ?--------------------------------------------------------------------------------------------------- ? ? ?Medications: ?Outpatient Medications Prior to Visit  ?Medication Sig  ? acetaminophen (TYLENOL) 500 MG tablet Take 1,000 mg by mouth every 6 (six) hours as needed. Pain ?  ? albuterol (PROVENTIL) (2.5 MG/3ML) 0.083% nebulizer solution Take 3 mLs (2.5 mg total) by nebulization every 6 (six) hours as needed for wheezing or shortness of breath.  ? amiodarone (PACERONE) 200 MG tablet TAKE 1 TABLET(200 MG) BY MOUTH DAILY AS DIRECTED  ? aspirin 81 MG EC tablet Take 81 mg by mouth daily. Swallow whole.  ? atorvastatin (LIPITOR) 40 MG tablet TAKE 1 TABLET(40 MG) BY MOUTH DAILY  ? carvedilol (COREG) 6.25 MG tablet Take 1 tablet (6.25 mg total) by mouth 2 (two) times daily with a meal.  ? FARXIGA 10 MG TABS tablet TAKE 1 TABLET(10 MG) BY MOUTH DAILY BEFORE BREAKFAST  ? fluorouracil (EFUDEX) 5 % cream Apply topically 2 (two) times daily. Thin coat bid to bilateral temples for 7 days  ? glucose blood (CONTOUR NEXT TEST) test strip Check sugar once daily  DX E11.9  ? hydrOXYzine (ATARAX) 10 MG tablet TAKE 1 TO 2 TABLETS(10 TO 20 MG) BY MOUTH EVERY 6 HOURS AS NEEDED  ? levothyroxine (SYNTHROID) 100 MCG tablet TAKE 1 TABLET(100 MCG) BY MOUTH DAILY BEFORE BREAKFAST  ? losartan (COZAAR) 25 MG tablet Take 1 tablet (25 mg total) by mouth every evening.  ? metFORMIN (GLUCOPHAGE) 1000 MG tablet TAKE 1 TABLET(1000 MG) BY MOUTH DAILY WITH SUPPER  ? omeprazole (PRILOSEC) 20 MG capsule TAKE 1 CAPSULE(20 MG) BY MOUTH DAILY  ? SPIRIVA HANDIHALER 18 MCG inhalation capsule PLACE 1 CAPSULE INTO INHALER AND INHALE EVERY DAY AS NEEDED  ?  Torsemide 40 MG TABS Take 40 mg by mouth daily. May take extra 20 mg (1/2 tab) after lunch as needed for swelling  ? ?No facility-administered medications prior to visit.  ? ? ?Review of Systems  ?Constitutional:  Negative for appetite change, chills, fatigue and fever.  ?Respiratory:  Negative for chest  tightness and shortness of breath.   ?Cardiovascular:  Negative for chest pain and palpitations.  ?Gastrointestinal:  Negative for abdominal pain, nausea and vomiting.  ?Neurological:  Negative for dizziness and weakness.  ? ?Last hemoglobin A1c ?Lab Results  ?Component Value Date  ? HGBA1C 7.8 (H) 12/23/2020  ? ?  ?  Objective  ?  ?BP 121/73 (BP Location: Left Arm, Patient Position: Sitting, Cuff Size: Large)   Pulse 69   Temp 98.4 ?F (36.9 ?C) (Temporal)   Resp 18   Ht '5\' 4"'$  (1.626 m)   Wt 181 lb (82.1 kg)   SpO2 97%   BMI 31.07 kg/m?  ?BP Readings from Last 3 Encounters:  ?06/08/21 121/73  ?04/24/21 114/68  ?02/20/21 120/60  ? ?Wt Readings from Last 3 Encounters:  ?06/08/21 181 lb (82.1 kg)  ?05/22/21 176 lb (79.8 kg)  ?04/24/21 176 lb 8 oz (80.1 kg)  ? ?  ? ?Physical Exam ?Vitals and nursing note reviewed.  ?Constitutional:   ?   Appearance: Normal appearance. She is normal weight.  ?HENT:  ?   Right Ear: Tympanic membrane normal.  ?   Left Ear: Tympanic membrane normal.  ?   Nose: Nose normal.  ?   Mouth/Throat:  ?   Mouth: Mucous membranes are moist.  ?   Pharynx: Oropharynx is clear.  ?Eyes:  ?   General: No scleral icterus. ?   Conjunctiva/sclera: Conjunctivae normal.  ?Cardiovascular:  ?   Rate and Rhythm: Normal rate and regular rhythm.  ?   Pulses: Normal pulses.  ?   Heart sounds: Normal heart sounds.  ?Pulmonary:  ?   Effort: Pulmonary effort is normal.  ?   Breath sounds: Normal breath sounds.  ?Abdominal:  ?   General: Bowel sounds are normal.  ?   Palpations: Abdomen is soft.  ?Musculoskeletal:  ?   Cervical back: Normal range of motion and neck supple.  ?   Comments: Trace. pedal edema in ankles  ?Skin: ?   General: Skin is warm and dry.  ?Neurological:  ?   Mental Status: She is alert.  ?Psychiatric:     ?   Mood and Affect: Mood normal.     ?   Behavior: Behavior normal.     ?   Thought Content: Thought content normal.     ?   Judgment: Judgment normal.  ?  ? ? ?No results found for any  visits on 06/08/21. ? Assessment & Plan  ?  ? ?1. Type 2 diabetes mellitus without complication, without long-term current use of insulin ([place]) ?A1c under good control at 6.8%.  Down from 7.8 on last visit. ?- POCT glycosylated hemoglobin (Hb A1C) ?- AMB Referral to [place] ? ?2. Essential hypertension ?Controlled. ?- AMB Referral to [place] ? ?3. COPD, mild ([place]) ?Patient quit smoking several years ago.  Discussed cost of Spiriva.  Consider changing albuterol nebulizer to DuoNeb. ?- AMB Referral to [place] ? ?4. Congestive heart failure, unspecified HF chronicity, unspecified heart failure type ([place]) ?Clinically stable ?- AMB Referral to [place] ? ?5. Spinal stenosis of lumbar region without neurogenic claudication ? ? ?  6. Osteoarthritis of spine with radiculopathy, lumbar region ? ? ? ?No follow-ups on file.  ?   ? ?I, [name], MD, have reviewed all documentation for this visit. The documentation on 06/10/21 for the exam, diagnosis, procedures, and orders are all accurate and complete. ? ? ? ?Mende Biswell Cranford Mon, MD  ?[facility] ?[phone] (phone) ?[phone] (fax) ? ?[facility] ?

## 2021-06-09 ENCOUNTER — Telehealth: Payer: Self-pay

## 2021-06-09 NOTE — Progress Notes (Signed)
? ? ?Chronic Care Management ?Pharmacy Assistant  ? ?Name: Kathryn [name]  MRN: [id number] DOB: 02-11-1934 ? ?Chart Review for clinical pharmacist for 06/13/2021 at 1:00 pm. ? ?Conditions to be addressed/monitored: ?CHF, HTN, HLD, COPD, DMII, Depression, Hypothyroidism, and Basilar artery insufficiency, PSVT, Lumbar disc disease radiculopathy, Degenerative arthritis of lumbar spine, Back pain, Normocytic anemia ? ?Primary concerns for visit include: ?Overall Health  ? ?Recent office visits:  ?06/09/2021 Dr.Gilbert MD (PCP) Note is not finsh ?03/30/2021 Felecia McCray RN (CCM) No medication Changes noted ?02/09/2021 Felecia McCray RN (CCM) No medication Changes noted ?02/01/2021 Dr. Rosanna Randy MD (PCP) No medication Changes noted ?01/04/2021 Dr. Rosanna Randy MD (PCP) No medication Changes noted ?01/03/2021 Felecia McCray RN (CCM) No medication Changes noted ? ?Recent consult visits:  ?05/22/2021 Nile Dear EMT (Cardiology) No Medication Changes noted ?05/15/2021 Dorise Hiss PA (Orthopedic Surgery) Unable to see note ?05/03/2021 Dr. Nehemiah Massed MD (Dermatology) start Fluorouracil 5% Apply topically 2 (two) times daily for 7 days ?04/24/2021 Darylene Price FNP (Cardiology) No Medication Changes noted, return in 6 months ?03/22/2021 Nile Dear EMT (Cardiology) No Medication Changes noted ?02/21/2021 Nile Dear EMT (Cardiology) No Medication Changes noted ?02/20/2021 Dr. Rockey Situ MD (Cardiology) Change Torsemide 40 mg daily with extra 20 mg after lunch as needed for swelling ?02/08/2021 Dorise Hiss PA (Orthopedic Surgery) glenohumeral cortisone injection given in both shoulder, follow-up 4-6 weeks  ?01/30/2021 Dr. Nehemiah Massed MD (Dermatology) No Medication Changes noted, return in 3 months ?01/20/2021 Marrianne Mood PA-C (Cardiology)Increase to torsemide 40 mg take 1 tablet by mouth once daily ?01/13/2021 Marrianne Mood PA-C (Cardiology) Increase to torsemide 40 mg daily for 4 days, After 4 days, drop back to torsemide 20  mg daily  with an extra torsemide as needed for wt gain 3 lbs overnight / 5 lbs in 1 week ?01/12/2021 Nile Dear EMT (Cardiology) No Medication Changes noted ? ?Hospital visits:  ?Medication Reconciliation was completed by comparing discharge summary, patient?s EMR and Pharmacy list, and upon discussion with patient. ? ?Admitted to the hospital on 12/21/2020 due to Multifocal pneumonia. Discharge date was 12/24/2020. Discharged from [facility].   ? ?New?Medications Started at [facility] Discharge:?? ?-started None ? ?Medication Changes at Hospital Discharge: ?-Changed None ? ?Medications Discontinued at Hospital Discharge: ?-Stopped None ? ?Medications that remain the same after Hospital Discharge:??  ?-All other medications will remain the same.  ? ?Questions for Clinical Pharmacist:  ? ?1.Are you able to connect with Patient  ? I was able to connect with the patient. ?  ?  ?2.Confirmed appointment date/time with patient/caregiver? Confirm appointment on 06/13/2021 at 1:00 pm with Daron Offer CPP ?  ?  ?3.Visit type telephone ?  ?  ?4.Patient/Caregiver instructed to bring medications to appointment. Patient is aware to bring all medications and supplements to appointment ? ?  ?5.What, if any, problems do you have getting your medications from the pharmacy? None, Patient denies any issues/problems getting her medications. ?  ? ?6.What is your top health concern to discuss at your upcoming visit? ? Patient denies any concerns at this time. ?  ?7.Have you seen any other providers since your last visit? No ? Patient denies seeing any other providers since her appointment. ? ?Medications: ?Outpatient Encounter Medications as of 06/09/2021  ?Medication Sig  ? acetaminophen (TYLENOL) 500 MG tablet Take 1,000 mg by mouth every 6 (six) hours as needed. Pain ?  ? albuterol (PROVENTIL) (2.5 MG/3ML) 0.083% nebulizer solution Take 3 mLs (2.5 mg total) by nebulization every 6 (six) hours as needed  for  wheezing or shortness of breath.  ? amiodarone (PACERONE) 200 MG tablet TAKE 1 TABLET(200 MG) BY MOUTH DAILY AS DIRECTED  ? aspirin 81 MG EC tablet Take 81 mg by mouth daily. Swallow whole.  ? atorvastatin (LIPITOR) 40 MG tablet TAKE 1 TABLET(40 MG) BY MOUTH DAILY  ? carvedilol (COREG) 6.25 MG tablet Take 1 tablet (6.25 mg total) by mouth 2 (two) times daily with a meal.  ? FARXIGA 10 MG TABS tablet TAKE 1 TABLET(10 MG) BY MOUTH DAILY BEFORE BREAKFAST  ? fluorouracil (EFUDEX) 5 % cream Apply topically 2 (two) times daily. Thin coat bid to bilateral temples for 7 days  ? glucose blood (CONTOUR NEXT TEST) test strip Check sugar once daily  DX E11.9  ? hydrOXYzine (ATARAX) 10 MG tablet TAKE 1 TO 2 TABLETS(10 TO 20 MG) BY MOUTH EVERY 6 HOURS AS NEEDED  ? levothyroxine (SYNTHROID) 100 MCG tablet TAKE 1 TABLET(100 MCG) BY MOUTH DAILY BEFORE BREAKFAST  ? losartan (COZAAR) 25 MG tablet Take 1 tablet (25 mg total) by mouth every evening.  ? metFORMIN (GLUCOPHAGE) 1000 MG tablet TAKE 1 TABLET(1000 MG) BY MOUTH DAILY WITH SUPPER  ? omeprazole (PRILOSEC) 20 MG capsule TAKE 1 CAPSULE(20 MG) BY MOUTH DAILY  ? SPIRIVA HANDIHALER 18 MCG inhalation capsule PLACE 1 CAPSULE INTO INHALER AND INHALE EVERY DAY AS NEEDED  ? Torsemide 40 MG TABS Take 40 mg by mouth daily. May take extra 20 mg (1/2 tab) after lunch as needed for swelling  ? ?No facility-administered encounter medications on file as of 06/09/2021.  ? ? ?Care Gaps: ?COVID-19 Vaccine ?Shingrix Vaccine ?Foot Exam  ? ?Star Rating Drugs: ?Atorvastatin 40 mg last filled 04/24/2021 90 day supply at [facility].  ?Metformin 1000 mg   last filled 05/25/2021 90 day supply at [facility].  ?Losartan 25 mg  last filled 04/15/2021 90 day supply at [facility]. ?Farxiga 10 mg  last filled 05/25/2021 90 day supply at [facility]. ? ?Medication Fill Gaps: ?None ID ? ?Bessie Kellihan,CPA ?Clinical Pharmacist Assistant ?[phone]  ? ?

## 2021-06-12 ENCOUNTER — Other Ambulatory Visit: Payer: Self-pay | Admitting: Family Medicine

## 2021-06-12 LAB — POCT GLYCOSYLATED HEMOGLOBIN (HGB A1C): Hemoglobin A1C: 7.8 % — AB (ref 4.0–5.6)

## 2021-06-12 NOTE — Patient Outreach (Signed)
[facility]) Care Management ? ?06/12/2021 ? ?[place] [birth year] ?[phone] ? ? ?Patient called to have telephone appointment cancelled for tomorrow. Didn't think there was a need for our services as that it was a misunderstanding with PCP.  ? ?Philmore Pali ?[place] Management Assistant ?[phone] ? ?

## 2021-06-13 ENCOUNTER — Telehealth: Payer: Medicare Other

## 2021-06-20 ENCOUNTER — Ambulatory Visit (INDEPENDENT_AMBULATORY_CARE_PROVIDER_SITE_OTHER): Payer: Medicare Other | Admitting: Dermatology

## 2021-06-20 ENCOUNTER — Telehealth: Payer: Self-pay

## 2021-06-20 ENCOUNTER — Other Ambulatory Visit: Payer: Self-pay

## 2021-06-20 ENCOUNTER — Encounter: Payer: Medicare Other | Admitting: Dermatology

## 2021-06-20 DIAGNOSIS — C44329 Squamous cell carcinoma of skin of other parts of face: Secondary | ICD-10-CM

## 2021-06-20 DIAGNOSIS — D492 Neoplasm of unspecified behavior of bone, soft tissue, and skin: Secondary | ICD-10-CM

## 2021-06-20 DIAGNOSIS — C44320 Squamous cell carcinoma of skin of unspecified parts of face: Secondary | ICD-10-CM

## 2021-06-20 MED ORDER — MUPIROCIN 2 % EX OINT: 1.0000 "application " | TOPICAL_OINTMENT | Freq: Every day | CUTANEOUS | 1 refills | Status: DC

## 2021-06-20 NOTE — Telephone Encounter (Signed)
Pt doing fine after today's surgery./sh 

## 2021-06-20 NOTE — Progress Notes (Signed)
? ?  Follow-Up Visit ?  ?Subjective  ?Kathryn [name] is a 88 y.o. female who presents for the following: Procedure (Cyst vs other of  the right cheek preauricular). ? ?The following portions of the chart were reviewed this encounter and updated as appropriate:  ? Tobacco  Allergies  Meds  Problems  Med Hx  Surg Hx  Fam Hx   ?  ?Review of Systems:  No other skin or systemic complaints except as noted in HPI or Assessment and Plan. ? ?Objective  ?Well appearing patient in no apparent distress; mood and affect are within normal limits. ? ?A focused examination was performed including face. Relevant physical exam findings are noted in the Assessment and Plan. ? ?R cheek preauricular ?Cystic pap 1.5cm ? ? ?Assessment & Plan  ?Neoplasm of skin ?R cheek preauricular ? ?Skin excision ? ?Lesion length (cm):  1.5 ?Lesion width (cm):  1.5 ?Margin per side (cm):  0 ?Total excision diameter (cm):  1.5 ?Informed consent: discussed and consent obtained   ?Timeout: patient name, date of birth, surgical site, and procedure verified   ?Procedure prep:  Patient was prepped and draped in usual sterile fashion ?Prep type:  Isopropyl alcohol and povidone-iodine ?Anesthesia: the lesion was anesthetized in a standard fashion   ?Anesthetic:  1% lidocaine w/ epinephrine 1-100,000 buffered w/ 8.4% NaHCO3 (3cc lido w/ epi, 3cc bupivicaine, Total of 6cc) ?Instrument used comment:  #15c blade ?Hemostasis achieved with: pressure   ?Hemostasis achieved with comment:  Electrocautery ?Outcome: patient tolerated procedure well with no complications   ?Post-procedure details: sterile dressing applied and wound care instructions given   ?Dressing type: bandage, pressure dressing and bacitracin (Mupirocin)   ? ?Skin repair ?Complexity:  Complex ?Final length (cm):  2.8 ?Reason for type of repair: reduce tension to allow closure, reduce the risk of dehiscence, infection, and necrosis, reduce subcutaneous dead space and avoid a hematoma, allow  closure of the large defect, preserve normal anatomy, preserve normal anatomical and functional relationships and enhance both functionality and cosmetic results   ?Undermining: area extensively undermined   ?Undermining comment:  Undermining Defect 1.5cm ?Subcutaneous layers (deep stitches):  ?Suture size:  5-0 ?Suture type: Vicryl (polyglactin 910)   ?Subcutaneous suture technique: Inverted Dermal. ?Fine/surface layer approximation (top stitches):  ?Suture size:  5-0 ?Suture type: nylon   ?Stitches: horizontal mattress   ?Suture removal (days):  7 ?Hemostasis achieved with: pressure ?Outcome: patient tolerated procedure well with no complications   ?Post-procedure details: sterile dressing applied and wound care instructions given   ?Dressing type: bandage, pressure dressing and bacitracin (Mupirocin)   ? ?mupirocin ointment (BACTROBAN) 2 % ?Apply 1 application. topically daily. Qd to excision site ? ?Specimen 1 - Surgical pathology ?Differential Diagnosis: D48.5 Cyst vs other ? ?Check Margins: yes ?Cystic pap 1.5cm ? ?Cyst vs other ?Excised today ?Start Mupirocin oint qd to excision site ? ? ?Return in about 1 week (around 06/27/2021) for suture removal. ? ?I, Othelia Pulling, RMA, am acting as scribe for Sarina Ser, MD . ?Documentation: I have reviewed the above documentation for accuracy and completeness, and I agree with the above. ? ?Sarina Ser, MD ? ?

## 2021-06-20 NOTE — Patient Instructions (Signed)
Wound Care Instructions ? ?On the day following your surgery, you should begin doing daily dressing changes: ?Remove the old dressing and discard it. ?Cleanse the wound gently with tap water. This may be done in the shower or by placing a wet gauze pad directly on the wound and letting it soak for several minutes. ?It is important to gently remove any dried blood from the wound in order to encourage healing. This may be done by gently rolling a moistened Q-tip on the dried blood. Do not pick at the wound. ?If the wound should start to bleed, continue cleaning the wound, then place a moist gauze pad on the wound and hold pressure for a few minutes.  ?Make sure you then dry the skin surrounding the wound completely or the tape will not stick to the skin. Do not use cotton balls on the wound. ?After the wound is clean and dry, apply the ointment gently with a Q-tip. ?Cut a non-stick pad to fit the size of the wound. Lay the pad flush to the wound. If the wound is draining, you may want to reinforce it with a small amount of gauze on top of the non-stick pad for a little added compression to the area. ?Use the tape to seal the area completely. ?Select from the following with respect to your individual situation: ?If your wound has been stitched closed: continue the above steps 1-8 at least daily until your sutures are removed. ?If your wound has been left open to heal: continue steps 1-8 at least daily for the first 3-4 weeks. ?We would like for you to take a few extra precautions for at least the next week. ?Sleep with your head elevated on pillows if our wound is on your head. ?Do not bend over or lift heavy items to reduce the chance of elevated blood pressure to the wound ?Do not participate in particularly strenuous activities. ? ? ?Below is a list of dressing supplies you might need.  ?Cotton-tipped applicators - Q-tips ?Gauze pads (2x2 and/or 4x4) - All-Purpose Sponges ?Non-stick dressing material - Telfa ?Tape -  Paper or Hypafix ?New and clean tube of petroleum jelly - Vaseline  ? ? ?Comments on Post-Operative Period ?Slight swelling and redness often appear around the wound. This is normal and will disappear within several days following the surgery. ?The healing wound will drain a brownish-red-yellow discharge during healing. This is a normal phase of wound healing. As the wound begins to heal, the drainage may increase in amount. Again, this drainage is normal. ?Notify us if the drainage becomes persistently bloody, excessively swollen, or intensely painful or develops a foul odor or red streaks.  ?If you should experience mild discomfort during the healing phase, you may take an aspirin-free medication such as Tylenol (acetaminophen). Notify us if the discomfort is severe or persistent. Avoid alcoholic beverages when taking pain medicine. ? ?In Case of Wound Hemorrhage ?A wound hemorrhage is when the bandage suddenly becomes soaked with bright red blood and flows profusely. If this happens, sit down or lie down with your head elevated. If the wound has a dressing on it, do not remove the dressing. Apply pressure to the existing gauze. If the wound is not covered, use a gauze pad to apply pressure and continue applying the pressure for 20 minutes without peeking. DO NOT COVER THE WOUND WITH A LARGE TOWEL OR WASH CLOTH. Release your hand from the wound site but do not remove the dressing. If the bleeding has stopped,   gently clean around the wound. Leave the dressing in place for 24 hours if possible. This wait time allows the blood vessels to close off so that you do not spark a new round of bleeding by disrupting the newly clotted blood vessels with an immediate dressing change. If the bleeding does not subside, continue to hold pressure. If matters are out of your control, contact an After Hours clinic or go to the Emergency Room. ? ? ? ?If You Need Anything After Your Visit ? ?If you have any questions or concerns for  your doctor, please call our main line at [phone] and press option 4 to reach your doctor's medical assistant. If no one answers, please leave a voicemail as directed and we will return your call as soon as possible. Messages left after 4 pm will be answered the following business day.  ? ?You may also send [place] a message via MyChart. We typically respond to MyChart messages within 1-2 business days. ? ?For prescription refills, please ask your pharmacy to contact our office. Our fax number is [phone]. ? ?If you have an urgent issue when the clinic is closed that cannot wait until the next business day, you can page your doctor at the number below.   ? ?Please note that while we do our best to be available for urgent issues outside of office hours, we are not available 24/7.  ? ?If you have an urgent issue and are unable to reach [place], you may choose to seek medical care at your doctor's office, retail clinic, urgent care center, or emergency room. ? ?If you have a medical emergency, please immediately call 911 or go to the emergency department. ? ?Pager Numbers ? ?- Dr. Nehemiah Massed: [phone] ? ?- Dr. Laurence Ferrari: [phone] ? ?- Dr. Nicole Kindred: [phone] ? ?In the event of inclement weather, please call our main line at [phone] for an update on the status of any delays or closures. ? ?Dermatology Medication Tips: ?Please keep the boxes that topical medications come in in order to help keep track of the instructions about where and how to use these. Pharmacies typically print the medication instructions only on the boxes and not directly on the medication tubes.  ? ?If your medication is too expensive, please contact our office at [phone] option 4 or send [place] a message through Portal.  ? ?We are unable to tell what your co-pay for medications will be in advance as this is different depending on your insurance coverage. However, we may be able to find a substitute medication at lower cost or fill out  paperwork to get insurance to cover a needed medication.  ? ?If a prior authorization is required to get your medication covered by your insurance company, please allow [place] 1-2 business days to complete this process. ? ?Drug prices often vary depending on where the prescription is filled and some pharmacies may offer cheaper prices. ? ?The website www.goodrx.com contains coupons for medications through different pharmacies. The prices here do not account for what the cost may be with help from insurance (it may be cheaper with your insurance), but the website can give you the price if you did not use any insurance.  ?- You can print the associated coupon and take it with your prescription to the pharmacy.  ?- You may also stop by our office during regular business hours and pick up a GoodRx coupon card.  ?- If you need your prescription sent electronically to a different pharmacy, notify our  office through [facility] or by phone at [phone] option 4. ? ? ? ? ?Si Usted Necesita Algo Despu?s de Su Visita ? ?Tambi?n puede enviarnos un mensaje a trav?s de MyChart. Por lo general respondemos a los mensajes de MyChart en el transcurso de 1 a 2 d?as h?biles. ? ?Para renovar recetas, por favor pida a su farmacia que se ponga en contacto con nuestra oficina. Nuestro n?mero de fax es el [phone]. ? ?Si tiene un asunto urgente cuando la cl?nica est? cerrada y que no puede esperar hasta el siguiente d?a h?bil, puede llamar/localizar a su doctor(a) al n?mero que aparece a continuaci?n.  ? ?Por favor, tenga en cuenta que aunque hacemos todo lo posible para estar disponibles para asuntos urgentes fuera del horario de oficina, no estamos disponibles las 24 horas del d?a, los 7 d?as de la semana.  ? ?Si tiene un problema urgente y no puede comunicarse con nosotros, puede optar por buscar atenci?n m?dica  en el consultorio de su doctor(a), en una cl?nica privada, en un centro de atenci?n urgente o en una sala de  emergencias. ? ?Si tiene Engineer, maintenance (IT) m?dica, por favor llame inmediatamente al 911 o vaya a la sala de emergencias. ? ?N?meros de b?per ? ?- Dr. Nehemiah Massed: [phone] ? ?- Dra. Moye: [phone] ? ?- Dra

## 2021-06-22 ENCOUNTER — Encounter: Payer: Self-pay | Admitting: Dermatology

## 2021-06-22 ENCOUNTER — Other Ambulatory Visit (HOSPITAL_COMMUNITY): Payer: Self-pay

## 2021-06-22 NOTE — Progress Notes (Signed)
Today had a visit with [name].  She states been doing ok.  Waiting on warm weather.  She mostly stays indoors.  Does little house hold chores.  She has everything she needs for daily living.  Her apartment is attached to her sons home, she has good family support.  She cooks for herself.  She has been to her PCP and skin doctor.  Her A1C has come down.  She watches what she eats and drinks.  She has all her medications and aware of how to take them.  She has no complaints of pain, headaches, dizziness or increased shortness of breath.  She denies any swelling.  She weighs daily with no weight gain right now.  She has pain in shoulder and legs and she is able to live with the pain and aware of what she can do and can not.  Will continue to visit for heart failure, diet and medication management.  ? 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

## 2021-06-24 ENCOUNTER — Other Ambulatory Visit: Payer: Self-pay | Admitting: Family Medicine

## 2021-06-24 DIAGNOSIS — E039 Hypothyroidism, unspecified: Secondary | ICD-10-CM

## 2021-06-27 ENCOUNTER — Encounter: Payer: Self-pay | Admitting: Dermatology

## 2021-06-27 ENCOUNTER — Ambulatory Visit (INDEPENDENT_AMBULATORY_CARE_PROVIDER_SITE_OTHER): Payer: Medicare Other | Admitting: Dermatology

## 2021-06-27 DIAGNOSIS — Z4802 Encounter for removal of sutures: Secondary | ICD-10-CM

## 2021-06-27 DIAGNOSIS — C44329 Squamous cell carcinoma of skin of other parts of face: Secondary | ICD-10-CM

## 2021-06-27 DIAGNOSIS — L720 Epidermal cyst: Secondary | ICD-10-CM | POA: Diagnosis not present

## 2021-06-27 DIAGNOSIS — C4492 Squamous cell carcinoma of skin, unspecified: Secondary | ICD-10-CM

## 2021-06-27 DIAGNOSIS — D492 Neoplasm of unspecified behavior of bone, soft tissue, and skin: Secondary | ICD-10-CM

## 2021-06-27 NOTE — Progress Notes (Signed)
? ?Follow-Up Visit ?  ?Subjective  ?Kathryn [name] is a 88 y.o. female who presents for the following: Cyst vs other (L ant neck, pt presents for excision) and SCC margins free bx proven (R cheek preauricular, pt presents for suture removal). ? ?The following portions of the chart were reviewed this encounter and updated as appropriate:  ? Tobacco  Allergies  Meds  Problems  Med Hx  Surg Hx  Fam Hx   ?  ?Review of Systems:  No other skin or systemic complaints except as noted in HPI or Assessment and Plan. ? ?Objective  ?Well appearing patient in no apparent distress; mood and affect are within normal limits. ? ?A focused examination was performed including neck, face. Relevant physical exam findings are noted in the Assessment and Plan. ? ?L ant neck ?Cystic pap 1.2cm ? ?Head - Anterior (Face) ?Healing excision site ? ? ?Assessment & Plan  ?Neoplasm of skin ?L ant neck ? ?Skin excision ? ?Lesion length (cm):  1.2 ?Lesion width (cm):  1.2 ?Margin per side (cm):  0 ?Total excision diameter (cm):  1.2 ?Informed consent: discussed and consent obtained   ?Timeout: patient name, date of birth, surgical site, and procedure verified   ?Procedure prep:  Patient was prepped and draped in usual sterile fashion ?Prep type:  Isopropyl alcohol and povidone-iodine ?Anesthesia: the lesion was anesthetized in a standard fashion   ?Anesthetic:  1% lidocaine w/ epinephrine 1-100,000 buffered w/ 8.4% NaHCO3 (3cc lido w/ epi) ?Instrument used comment:  #15c blade ?Hemostasis achieved with: pressure   ?Hemostasis achieved with comment:  Electrocautery ?Outcome: patient tolerated procedure well with no complications   ?Post-procedure details: sterile dressing applied and wound care instructions given   ?Dressing type: bandage and pressure dressing (Mupirocin)   ? ?Skin repair ?Complexity:  Complex ?Final length (cm):  1.7 ?Reason for type of repair: reduce tension to allow closure, reduce the risk of dehiscence, infection, and  necrosis, reduce subcutaneous dead space and avoid a hematoma, allow closure of the large defect, preserve normal anatomy, preserve normal anatomical and functional relationships and enhance both functionality and cosmetic results   ?Undermining: area extensively undermined   ?Undermining comment:  Undermining Defect 1.2cm ?Subcutaneous layers (deep stitches):  ?Suture size:  5-0 ?Suture type: Vicryl (polyglactin 910)   ?Subcutaneous suture technique: Inverted Dermal. ?Fine/surface layer approximation (top stitches):  ?Suture size:  5-0 ?Suture type: nylon   ?Stitches: horizontal mattress   ?Suture removal (days):  7 ?Hemostasis achieved with: pressure ?Outcome: patient tolerated procedure well with no complications   ?Post-procedure details: sterile dressing applied and wound care instructions given   ?Dressing type: bandage, pressure dressing and bacitracin (Mupirocin)   ? ?Specimen 1 - Surgical pathology ?Differential Diagnosis: D48.5 Cyst vs Other ? ?Check Margins: yes ?Cystic pap ? ?Cyst vs other, excised today ?Start Mupirocin oint qd ? ?Related Medications ?mupirocin ointment (BACTROBAN) 2 % ?Apply 1 application. topically daily. Qd to excision site ? ?Squamous cell carcinoma of skin ?Head - Anterior (Face) ? ?Margins free, bx proven ? ?Encounter for Removal of Sutures ?- Incision site at the R cheek preauricular is clean, dry and intact ?- Wound cleansed, sutures removed, wound cleansed and steri strips applied.  ?- Discussed pathology results showing SCC margins free  ?- Patient advised to keep steri-strips dry until they fall off. ?- Scars remodel for a full year. ?- Once steri-strips fall off, patient can apply over-the-counter silicone scar cream each night to help with scar remodeling if desired. ?-  Patient advised to call with any concerns or if they notice any new or changing lesions.  ? ? ?Return in about 1 week (around 07/04/2021) for suture removal. ? ?I, Othelia Pulling, RMA, am acting as scribe for  Sarina Ser, MD . ?Documentation: I have reviewed the above documentation for accuracy and completeness, and I agree with the above. ? ?Sarina Ser, MD ? ?

## 2021-06-27 NOTE — Patient Instructions (Signed)
Wound Care Instructions ? ?On the day following your surgery, you should begin doing daily dressing changes: ?Remove the old dressing and discard it. ?Cleanse the wound gently with tap water. This may be done in the shower or by placing a wet gauze pad directly on the wound and letting it soak for several minutes. ?It is important to gently remove any dried blood from the wound in order to encourage healing. This may be done by gently rolling a moistened Q-tip on the dried blood. Do not pick at the wound. ?If the wound should start to bleed, continue cleaning the wound, then place a moist gauze pad on the wound and hold pressure for a few minutes.  ?Make sure you then dry the skin surrounding the wound completely or the tape will not stick to the skin. Do not use cotton balls on the wound. ?After the wound is clean and dry, apply the ointment gently with a Q-tip. ?Cut a non-stick pad to fit the size of the wound. Lay the pad flush to the wound. If the wound is draining, you may want to reinforce it with a small amount of gauze on top of the non-stick pad for a little added compression to the area. ?Use the tape to seal the area completely. ?Select from the following with respect to your individual situation: ?If your wound has been stitched closed: continue the above steps 1-8 at least daily until your sutures are removed. ?If your wound has been left open to heal: continue steps 1-8 at least daily for the first 3-4 weeks. ?We would like for you to take a few extra precautions for at least the next week. ?Sleep with your head elevated on pillows if our wound is on your head. ?Do not bend over or lift heavy items to reduce the chance of elevated blood pressure to the wound ?Do not participate in particularly strenuous activities. ? ? ?Below is a list of dressing supplies you might need.  ?Cotton-tipped applicators - Q-tips ?Gauze pads (2x2 and/or 4x4) - All-Purpose Sponges ?Non-stick dressing material - Telfa ?Tape -  Paper or Hypafix ?New and clean tube of petroleum jelly - Vaseline  ? ? ?Comments on Post-Operative Period ?Slight swelling and redness often appear around the wound. This is normal and will disappear within several days following the surgery. ?The healing wound will drain a brownish-red-yellow discharge during healing. This is a normal phase of wound healing. As the wound begins to heal, the drainage may increase in amount. Again, this drainage is normal. ?Notify us if the drainage becomes persistently bloody, excessively swollen, or intensely painful or develops a foul odor or red streaks.  ?If you should experience mild discomfort during the healing phase, you may take an aspirin-free medication such as Tylenol (acetaminophen). Notify us if the discomfort is severe or persistent. Avoid alcoholic beverages when taking pain medicine. ? ?In Case of Wound Hemorrhage ?A wound hemorrhage is when the bandage suddenly becomes soaked with bright red blood and flows profusely. If this happens, sit down or lie down with your head elevated. If the wound has a dressing on it, do not remove the dressing. Apply pressure to the existing gauze. If the wound is not covered, use a gauze pad to apply pressure and continue applying the pressure for 20 minutes without peeking. DO NOT COVER THE WOUND WITH A LARGE TOWEL OR WASH CLOTH. Release your hand from the wound site but do not remove the dressing. If the bleeding has stopped,   gently clean around the wound. Leave the dressing in place for 24 hours if possible. This wait time allows the blood vessels to close off so that you do not spark a new round of bleeding by disrupting the newly clotted blood vessels with an immediate dressing change. If the bleeding does not subside, continue to hold pressure. If matters are out of your control, contact an After Hours clinic or go to the Emergency Room. ? ?If You Need Anything After Your Visit ? ?If you have any questions or concerns for your  doctor, please call our main line at [phone] and press option 4 to reach your doctor's medical assistant. If no one answers, please leave a voicemail as directed and we will return your call as soon as possible. Messages left after 4 pm will be answered the following business day.  ? ?You may also send us a message via MyChart. We typically respond to MyChart messages within 1-2 business days. ? ?For prescription refills, please ask your pharmacy to contact our office. Our fax number is [phone]. ? ?If you have an urgent issue when the clinic is closed that cannot wait until the next business day, you can page your doctor at the number below.   ? ?Please note that while we do our best to be available for urgent issues outside of office hours, we are not available 24/7.  ? ?If you have an urgent issue and are unable to reach us, you may choose to seek medical care at your doctor's office, retail clinic, urgent care center, or emergency room. ? ?If you have a medical emergency, please immediately call 911 or go to the emergency department. ? ?Pager Numbers ? ?- Dr. Kowalski: [phone] ? ?- Dr. Moye: [phone] ? ?- Dr. Stewart: [phone] ? ?In the event of inclement weather, please call our main line at [phone] for an update on the status of any delays or closures. ? ?Dermatology Medication Tips: ?Please keep the boxes that topical medications come in in order to help keep track of the instructions about where and how to use these. Pharmacies typically print the medication instructions only on the boxes and not directly on the medication tubes.  ? ?If your medication is too expensive, please contact our office at [phone] option 4 or send us a message through MyChart.  ? ?We are unable to tell what your co-pay for medications will be in advance as this is different depending on your insurance coverage. However, we may be able to find a substitute medication at lower cost or fill out paperwork  to get insurance to cover a needed medication.  ? ?If a prior authorization is required to get your medication covered by your insurance company, please allow us 1-2 business days to complete this process. ? ?Drug prices often vary depending on where the prescription is filled and some pharmacies may offer cheaper prices. ? ?The website www.goodrx.com contains coupons for medications through different pharmacies. The prices here do not account for what the cost may be with help from insurance (it may be cheaper with your insurance), but the website can give you the price if you did not use any insurance.  ?- You can print the associated coupon and take it with your prescription to the pharmacy.  ?- You may also stop by our office during regular business hours and pick up a GoodRx coupon card.  ?- If you need your prescription sent electronically to a different pharmacy, notify our office through   [facility] MyChart or by phone at [phone] option 4. ? ? ? ? ?Si Usted Necesita Algo Despu?s de Su Visita ? ?Tambi?n puede enviarnos un mensaje a trav?s de MyChart. Por lo general respondemos a los mensajes de MyChart en el transcurso de 1 a 2 d?as h?biles. ? ?Para renovar recetas, por favor pida a su farmacia que se ponga en contacto con nuestra oficina. Nuestro n?mero de fax es el [phone]. ? ?Si tiene un asunto urgente cuando la cl?nica est? cerrada y que no puede esperar hasta el siguiente d?a h?bil, puede llamar/localizar a su doctor(a) al n?mero que aparece a continuaci?n.  ? ?Por favor, tenga en cuenta que aunque hacemos todo lo posible para estar disponibles para asuntos urgentes fuera del horario de oficina, no estamos disponibles las 24 horas del d?a, los 7 d?as de la semana.  ? ?Si tiene un problema urgente y no puede comunicarse con nosotros, puede optar por buscar atenci?n m?dica  en el consultorio de su doctor(a), en una cl?nica privada, en un centro de atenci?n urgente o en una sala de  emergencias. ? ?Si tiene una emergencia m?dica, por favor llame inmediatamente al 911 o vaya a la sala de emergencias. ? ?N?meros de b?per ? ?- Dr. Kowalski: [phone] ? ?- Dra. Moye: [phone] ? ?- Dra. St

## 2021-06-27 NOTE — Telephone Encounter (Signed)
Requested medications are due for refill today. yes ? ?Requested medications are on the active medications list.  yes ? ?Last refill. 08/17/2020 #90 3 refills ? ?Future visit scheduled.   yes ? ?Notes to clinic.  Failed refill protocol d/t abnormal labs. ? ? ? ?Requested Prescriptions  ?Pending Prescriptions Disp Refills  ? levothyroxine (SYNTHROID) 100 MCG tablet [Pharmacy Med Name: LEVOTHYROXINE 0.'100MG'$  (100MCG) TAB] 90 tablet 3  ?  Sig: TAKE 1 TABLET(100 MCG) BY MOUTH DAILY BEFORE BREAKFAST  ?  ? Endocrinology:  Hypothyroid Agents Failed - 06/24/2021  5:04 PM  ?  ?  Failed - TSH in normal range and within 360 days  ?  TSH  ?Date Value Ref Range Status  ?01/13/2021 5.930 (H) 0.450 - 4.500 uIU/mL Final  ?  ?  ?  ?  Passed - Valid encounter within last 12 months  ?  Recent Outpatient Visits   ? ?      ? 2 weeks ago Type 2 diabetes mellitus without complication, without long-term current use of insulin ([place])  ? [facility] Jerrol Banana., MD  ? 4 months ago Pneumonia due to infectious organism, unspecified laterality, unspecified part of lung  ? [facility] Jerrol Banana., MD  ? 5 months ago Multifocal pneumonia  ? [facility] Jerrol Banana., MD  ? 8 months ago Type 2 diabetes mellitus with diabetic neuropathy, without long-term current use of insulin ([place])  ? [facility] Jerrol Banana., MD  ? 11 months ago Type 2 diabetes mellitus without complication, without long-term current use of insulin ([place])  ? [facility] Jerrol Banana., MD  ? ?  ?  ?Future Appointments   ? ?        ? In 2 months Gollan, Kathlene November, MD [facility]  ? In 5 months Jerrol Banana., MD [facility], [facility]  ? ?  ? ?  ?  ?  ?  ?

## 2021-06-28 ENCOUNTER — Telehealth: Payer: Self-pay

## 2021-06-28 NOTE — Telephone Encounter (Signed)
Patient doing well after yesterdays surgery./sh 

## 2021-07-04 ENCOUNTER — Ambulatory Visit (INDEPENDENT_AMBULATORY_CARE_PROVIDER_SITE_OTHER): Payer: Medicare Other | Admitting: Dermatology

## 2021-07-04 DIAGNOSIS — L720 Epidermal cyst: Secondary | ICD-10-CM

## 2021-07-04 DIAGNOSIS — Z4802 Encounter for removal of sutures: Secondary | ICD-10-CM

## 2021-07-04 NOTE — Patient Instructions (Addendum)

## 2021-07-04 NOTE — Progress Notes (Signed)
? ?  Follow-Up Visit ?  ?Subjective  ?Kathryn [name] is a 88 y.o. female who presents for the following: cyst bx proven (L ant neck, 1 wk f/u, pt presents for suture removal). ? ?The following portions of the chart were reviewed this encounter and updated as appropriate:  ? Tobacco  Allergies  Meds  Problems  Med Hx  Surg Hx  Fam Hx   ?  ?Review of Systems:  No other skin or systemic complaints except as noted in HPI or Assessment and Plan. ? ?Objective  ?Well appearing patient in no apparent distress; mood and affect are within normal limits. ? ?A focused examination was performed including L neck. Relevant physical exam findings are noted in the Assessment and Plan. ? ?L ant neck ?Healing excision site ? ? ?Assessment & Plan  ?Epidermal inclusion cyst ?L ant neck ? ?Bx proven ? ?Encounter for Removal of Sutures ?- Incision site at the L ant neck is clean, dry and intact ?- Wound cleansed, sutures removed, wound cleansed and steri strips applied.  ?- Discussed pathology results showing cyst  ?- Patient advised to keep steri-strips dry until they fall off. ?- Scars remodel for a full year. ?- Once steri-strips fall off, patient can apply over-the-counter silicone scar cream each night to help with scar remodeling if desired. ?- Patient advised to call with any concerns or if they notice any new or changing lesions.  ? ? ?Return in about 6 months (around 01/03/2022) for UBSE, Hx of BCC. ? ?I, Othelia Pulling, RMA, am acting as scribe for Sarina Ser, MD . ?Documentation: I have reviewed the above documentation for accuracy and completeness, and I agree with the above. ? ?Sarina Ser, MD ? ? ?

## 2021-07-11 ENCOUNTER — Encounter: Payer: Self-pay | Admitting: Dermatology

## 2021-07-15 ENCOUNTER — Other Ambulatory Visit: Payer: Self-pay | Admitting: Family Medicine

## 2021-07-17 ENCOUNTER — Other Ambulatory Visit (HOSPITAL_COMMUNITY): Payer: Self-pay

## 2021-07-17 ENCOUNTER — Other Ambulatory Visit: Payer: Self-pay

## 2021-07-17 ENCOUNTER — Encounter (HOSPITAL_COMMUNITY): Payer: Self-pay

## 2021-07-17 ENCOUNTER — Other Ambulatory Visit: Payer: Self-pay | Admitting: Family

## 2021-07-17 MED ORDER — CARVEDILOL 6.25 MG PO TABS: 6.2500 mg | ORAL_TABLET | Freq: Two times a day (BID) | ORAL | 0 refills | Status: DC

## 2021-07-17 MED ORDER — LOSARTAN POTASSIUM 25 MG PO TABS: 25.0000 mg | ORAL_TABLET | Freq: Every evening | ORAL | 3 refills | Status: DC

## 2021-07-17 NOTE — Progress Notes (Signed)
Today spoke with Aleyna. She states there is no reason to visit when she can call e with problems.  She ddi say to reach out to her to just check on her now and then.  Did so today.  She states doing well, she does use wheel chair when goes out now.  In the home she ambulates with  nothing.  She does her own house work and cooking.  She is going to the beach with her sisters this weekend.  She appears that her mood is good.  She states she is adjusting to getting old.  Appetite is good and she watches her high sodium foods along with how much fluids she intakes.  She denies any chest pain, headaches, dizziness or increased shortness of breath.  She states has everything she needs for daily living.  She states has no fluid and weight is staying same.  She is aware of any up coming appts.  She has good family support.  Will continue to visit for heart failure, diet and medication management.  ? ?Warda Mcqueary ?[place] EMT-Paramedic ?[phone] ?

## 2021-08-23 ENCOUNTER — Telehealth (HOSPITAL_COMMUNITY): Payer: Self-pay

## 2021-08-23 NOTE — Telephone Encounter (Signed)
Contacted Cadance to check on her.  She states she will call me when she needs me and when I dont hear from her she is doing well.  She is aware of cardiology appt coming up.   She went to beach for a few days, she states wore her out but she had a good time.  She states her legs get weak walking around.  She states she is going to ask the doctor about taking something for her feet and legs, they are numb, tingly and keeps her awake at night.  She states her weight is down 5 lbs from Thanksgiving.  She stays active in her home, she does cleaning and cooking.  She loves to sit outside and enjoy her flowers when weather is good and warm.  She denies any problems such as chest pain, headaches, dizziness or increased shortness of breath.  She weighs daily.  She is aware to call when there is a problem.  We went over her medications and appts for rest of year.  She gets out some with family to go to stores.  Mostly stays at home.  She has an apartment attached to her sons home.  She has a lot of family support.  Will continue to visit for heart failure, diet and medication management.   [place] [phone]

## 2021-08-26 ENCOUNTER — Other Ambulatory Visit: Payer: Self-pay | Admitting: Family

## 2021-08-26 ENCOUNTER — Other Ambulatory Visit: Payer: Self-pay | Admitting: Cardiology

## 2021-08-26 ENCOUNTER — Other Ambulatory Visit: Payer: Self-pay | Admitting: Family Medicine

## 2021-08-27 NOTE — Progress Notes (Unsigned)
Cardiology Office Note  Date:  08/28/2021   ID:  PATRCIA SCHNEPP, DOB 28-Dec-1933, MRN [id number]  PCP:  Jerrol Banana., MD   Chief Complaint  Patient presents with   6 month follow up     Patient c/o feet and toes are cold and swelling. Medications reviewed by the patient verbally.     HPI:  Ms. Kontos is a 88 yo woman with  long smoking history for 50 years , stopped 6-7 years ago,  COPD back surgery x2,  chronic back pain with pain radiating down her left leg, possible hip arthritis, chronic shortness of breath  Pulmonary HTN/flash pulm HTN EF 40 to 45% January 2022  hyperlipidemia  Frequent hospitalizations for COPD/CHF who presents for f/u of her shortness of breath, hyperlipidemia, diastolic CHF  LOV 38/2505 Followed by para medicine service  Chronic foot swelling Reports that she sits for much of the day in a chair with her legs down Unable to put compression hose on, needs help  Lab work reviewed A1C 7.8 Old CR 1.19  Not taking spiriva, was too expensive Remains on torsemide 40 daily  Feels her breathing is stable Difficulty affording Farxiga  EKG personally reviewed by myself on todays visit Normal sinus rhythm rate 72 bpm left bundle branch block  Other past medical history reviewed Admission to the hospital sept to oct 2022 ER, chest x-ray showed multifocal infiltrates, small pleural effusions.  "Pneumonia" started on diuretics, steroids, bronchodilators., Diuresed 5L  Taking torsemide 40 daily, extra torsemide did not help swelling Chronic ankle swelling  Does not eat out much Does not drive Family has to drive her everywhere  Other past medical hx hospitalized from 3/1 through 3/4 for COPD and CHF exacerbations. Again 06/08/2020   in the hospital 03/27/2020  Presented with leg edema and breathing difficulty. placed on BiPAP and IV Lasix started,  DuoNeb, steroids, and Nitropaste.  Echocardiogram : mildly reduced LVSF (EF 40 to 45%),   left bundle branch block of unknown duration.   Some question of whether there was atrial fibrillation seen on her monitor  seen in the heart failure clinic 04/04/2020.   moderate shortness of breath with exertion.    Zio monitor    PCP 04/05/2020.    improvement in her breathing per documentation.   04/12/2020,   unable to sleep over the weekend / overnight.   feeling poorly and that symptoms similar to that before her admission   racing heart rate on Sunday night, 04/10/20.  atrial fib  Lasix up to 40 for 3 days then back to 20 daily Started on crestor 5 daily  Stress test ordered that showed no significant ischemia Ejection fraction 30 to 44% CT attenuation correction images with aortic calcification, coronary calcification proximal LAD region (Echocardiogram March 28, 2020 estimated ejection fraction 40 to 45%)   PMH:   has a past medical history of Actinic keratosis, Aortic atherosclerosis ([place]) (05/25/2020), Arthritis, Cardiomyopathy - presumed to be nonischemic, CHF (congestive heart failure) ([place]), COPD (chronic obstructive pulmonary disease) ([place]), Coronary artery calcification seen on CT scan, Diabetes mellitus without complication ([place]), Gastric ulcer (06/2010), Hyperlipidemia, Hypertension, Hypothyroidism, Left bundle branch block (LBBB), Neuromuscular disorder ([place]), Peripheral vascular disease ([place]), Pneumonia, PSVT (paroxysmal supraventricular tachycardia) ([place]), Seasonal allergies, Shortness of breath, Skin cancer, Squamous cell carcinoma of skin (03/23/2013), Squamous cell carcinoma of skin (02/04/2014), Squamous cell carcinoma of skin (09/24/2018), Squamous cell carcinoma of skin (09/08/2019), Squamous cell carcinoma of skin (09/08/2019), and Squamous cell carcinoma of  skin (06/20/2021).  PSH:    Past Surgical History:  Procedure Laterality Date   BACK SURGERY     4/12  Dr Shellia Carwin- for lumbar stenosis   BREAST CYST ASPIRATION Left 1965   negative   [place]   lumpectomy with biopsy   left   CARPAL TUNNEL RELEASE     right   COLONOSCOPY     COLONOSCOPY WITH PROPOFOL N/A 06/30/2015   Procedure: COLONOSCOPY WITH PROPOFOL;  Surgeon: Hulen Luster, MD;  Location[facility] ENDOSCOPY;  Service: Gastroenterology;  Laterality: N/A;   COLONOSCOPY WITH PROPOFOL N/A 01/22/2017   Procedure: COLONOSCOPY WITH PROPOFOL;  Surgeon: Lollie Sails, MD;  Location[facility] ENDOSCOPY;  Service: Endoscopy;  Laterality: N/A;   COLONOSCOPY WITH PROPOFOL N/A 01/28/2019   Procedure: COLONOSCOPY WITH PROPOFOL;  Surgeon: Toledo, Benay Pike, MD;  Location: [facility] ENDOSCOPY;  Service: Gastroenterology;  Laterality: N/A;   ESOPHAGOGASTRODUODENOSCOPY     ESOPHAGOGASTRODUODENOSCOPY (EGD) WITH PROPOFOL N/A 01/28/2019   Procedure: ESOPHAGOGASTRODUODENOSCOPY (EGD) WITH PROPOFOL;  Surgeon: Toledo, Benay Pike, MD;  Location: [facility] ENDOSCOPY;  Service: Gastroenterology;  Laterality: N/A;   EYE SURGERY     cataract extraction with IOL bilaterally   JOINT REPLACEMENT     LUMBAR LAMINECTOMY/DECOMPRESSION MICRODISCECTOMY  05/24/2011   Procedure: LUMBAR LAMINECTOMY/DECOMPRESSION MICRODISCECTOMY 2 LEVELS;  Surgeon: Magnus Sinning, MD;  Location: [facility] ORS;  Service: Orthopedics;  Laterality: N/A;  L2-L3, L3-L4 (x-ray)   RIGHT OOPHORECTOMY     due to STAPH INFECTION    Current Outpatient Medications  Medication Sig Dispense Refill   acetaminophen (TYLENOL) 500 MG tablet Take 1,000 mg by mouth every 6 (six) hours as needed. Pain      albuterol (PROVENTIL) (2.5 MG/3ML) 0.083% nebulizer solution Take 3 mLs (2.5 mg total) by nebulization every 6 (six) hours as needed for wheezing or shortness of breath. 150 mL 12   amiodarone (PACERONE) 200 MG tablet TAKE 1 TABLET(200 MG) BY MOUTH DAILY AS DIRECTED 90 tablet 2   aspirin 81 MG EC tablet Take 81 mg by mouth daily. Swallow whole.     atorvastatin (LIPITOR) 40 MG tablet TAKE 1 TABLET(40 MG) BY MOUTH DAILY 90 tablet 1   carvedilol (COREG) 6.25 MG tablet  Take 1 tablet (6.25 mg total) by mouth 2 (two) times daily with a meal. 180 tablet 0   FARXIGA 10 MG TABS tablet TAKE 1 TABLET(10 MG) BY MOUTH DAILY BEFORE BREAKFAST 30 tablet 5   fluorouracil (EFUDEX) 5 % cream Apply topically 2 (two) times daily. Thin coat bid to bilateral temples for 7 days 15 g 1   glucose blood (CONTOUR NEXT TEST) test strip Check sugar once daily  DX E11.9 100 each 3   hydrOXYzine (ATARAX) 10 MG tablet TAKE 1 TO 2 TABLETS(10 TO 20 MG) BY MOUTH EVERY 6 HOURS AS NEEDED 100 tablet 0   levothyroxine (SYNTHROID) 100 MCG tablet TAKE 1 TABLET(100 MCG) BY MOUTH DAILY BEFORE BREAKFAST 90 tablet 1   losartan (COZAAR) 25 MG tablet Take 1 tablet (25 mg total) by mouth every evening. 90 tablet 3   metFORMIN (GLUCOPHAGE) 1000 MG tablet TAKE 1 TABLET(1000 MG) BY MOUTH DAILY WITH SUPPER 90 tablet 3   mupirocin ointment (BACTROBAN) 2 % Apply 1 application. topically daily. Qd to excision site 22 g 1   omeprazole (PRILOSEC) 20 MG capsule TAKE 1 CAPSULE(20 MG) BY MOUTH DAILY 90 capsule 0   SPIRIVA HANDIHALER 18 MCG inhalation capsule PLACE 1 CAPSULE INTO INHALER AND INHALE EVERY  DAY AS NEEDED 30 capsule 12   Torsemide 40 MG TABS Take 40 mg by mouth daily. May take extra 20 mg (1/2 tab) after lunch as needed for swelling 40 tablet 11   No current facility-administered medications for this visit.     Allergies:   Oxycodone and Prednisone   Social History:  The patient  reports that she quit smoking about 17 years ago. Her smoking use included cigarettes. She has a 25.00 pack-year smoking history. She has never used smokeless tobacco. She reports current alcohol use. She reports that she does not use drugs.   Family History:   family history includes Arthritis in her son; Breast cancer in her maternal aunt; Dementia in her mother; Heart disease in her father; Hypertension in her sister; Lung cancer in her father.    Review of Systems: Review of Systems  Constitutional: Negative.   HENT:  Negative.    Respiratory: Negative.    Cardiovascular: Negative.   Gastrointestinal: Negative.   Musculoskeletal: Negative.   Neurological: Negative.   Psychiatric/Behavioral: Negative.    All other systems reviewed and are negative.  PHYSICAL EXAM: VS:  BP (!) 108/50 (BP Location: Left Arm, Patient Position: Sitting, Cuff Size: Normal)   Pulse 72   Ht '5\' 4"'$  (1.626 m)   Wt 176 lb 2 oz (79.9 kg)   SpO2 98%   BMI 30.23 kg/m  , BMI Body mass index is 30.23 kg/m. Constitutional:  oriented to person, place, and time. No distress.  HENT:  Head: Grossly normal Eyes:  no discharge. No scleral icterus.  Neck: No JVD, no carotid bruits  Cardiovascular: Regular rate and rhythm, no murmurs appreciated Trace pitting edema around the ankles and feet Pulmonary/Chest: Clear to auscultation bilaterally, no wheezes or rails Abdominal: Soft.  no distension.  no tenderness.  Musculoskeletal: Normal range of motion Neurological:  normal muscle tone. Coordination normal. No atrophy Skin: Skin warm and dry Psychiatric: normal affect, pleasant  Recent Labs: 12/21/2020: ALT 13 01/11/2021: Hemoglobin 11.2; Platelets 341 01/13/2021: TSH 5.930 01/20/2021: BNP 170.3; BUN 22; Creatinine, Ser 1.19; Potassium 5.0; Sodium 142    Lipid Panel Lab Results  Component Value Date   CHOL 177 10/03/2020   HDL 55 10/03/2020   LDLCALC 83 10/03/2020   TRIG 239 (H) 10/03/2020     Wt Readings from Last 3 Encounters:  08/28/21 176 lb 2 oz (79.9 kg)  07/17/21 180 lb (81.6 kg)  06/08/21 181 lb (82.1 kg)     ASSESSMENT AND PLAN:  Problem List Items Addressed This Visit       Cardiology Problems   Essential hypertension   PSVT (paroxysmal supraventricular tachycardia) (HCC)   Aortic atherosclerosis (HCC)   Acute CHF ([place])   Vertebrobasilar circulation transient ischemic attack     Other   Diabetes mellitus type 2, uncomplicated (HCC)   COPD, mild ([place])   Other Visit Diagnoses     Chronic combined  systolic and diastolic CHF, NYHA class 2 and ACC/AHA stage C ([place])    -  Primary   Centrilobular emphysema ([place])       Hyperlipidemia LDL goal <70         COPD/emphysema Unable to afford Spiriva On torsemide 40 daily, breathing stable Chronic ankle swelling Sedentary, deconditioned  Cardiomyopathy Ejection fraction 40 to 45% in January 2022 Continue Coreg, Farxiga, losartan Torsemide 40 daily, extra torsemide 20  milligrams in the p.m. for abdominal fullness or leg swelling Chronic ankle swelling likely component of venous insufficiency We  have recommended compression hose  Atrial tachycardia Continue beta-blocker at current dose On amiodarone Denies any tachypalpitations  Hyperlipidemia Continue Lipitor  Coronary disease with stable angina Currently with no symptoms of angina. No further workup at this time. Continue current medication regimen.  PSVT On amiodarone, coreg, no sx Previously noted early 2022  Neuropathy Burning of her feet at nighttime, neuropathic type pain Recommend she talk with Dr. Rosanna Randy, consideration of trial of gabapentin in the evening   Total encounter time more than 30 minutes  Greater than 50% was spent in counseling and coordination of care with the patient   Signed, Esmond Plants, M.D., Ph.D. [place], [place]

## 2021-08-28 ENCOUNTER — Encounter: Payer: Self-pay | Admitting: Cardiovascular Disease

## 2021-08-28 ENCOUNTER — Ambulatory Visit (INDEPENDENT_AMBULATORY_CARE_PROVIDER_SITE_OTHER): Payer: Medicare Other | Admitting: Cardiovascular Disease

## 2021-08-28 ENCOUNTER — Other Ambulatory Visit
Admission: RE | Admit: 2021-08-28 | Discharge: 2021-08-28 | Disposition: A | Discharge status: Home / Self Care | Payer: Medicare Other | Source: Ambulatory Visit | Attending: Cardiovascular Disease | Admitting: Cardiovascular Disease

## 2021-08-28 VITALS — BP 108/50 | HR 72 | Ht 64.0 in | Wt 176.1 lb

## 2021-08-28 DIAGNOSIS — I700 Atherosclerosis of aorta: Secondary | ICD-10-CM | POA: Diagnosis not present

## 2021-08-28 DIAGNOSIS — J432 Centrilobular emphysema: Secondary | ICD-10-CM | POA: Diagnosis not present

## 2021-08-28 DIAGNOSIS — G450 Vertebro-basilar artery syndrome: Secondary | ICD-10-CM | POA: Diagnosis not present

## 2021-08-28 DIAGNOSIS — E785 Hyperlipidemia, unspecified: Secondary | ICD-10-CM | POA: Diagnosis not present

## 2021-08-28 DIAGNOSIS — I5021 Acute systolic (congestive) heart failure: Secondary | ICD-10-CM | POA: Diagnosis not present

## 2021-08-28 DIAGNOSIS — I10 Essential (primary) hypertension: Secondary | ICD-10-CM

## 2021-08-28 DIAGNOSIS — I471 Supraventricular tachycardia, unspecified: Secondary | ICD-10-CM

## 2021-08-28 DIAGNOSIS — I5042 Chronic combined systolic (congestive) and diastolic (congestive) heart failure: Secondary | ICD-10-CM

## 2021-08-28 DIAGNOSIS — E119 Type 2 diabetes mellitus without complications: Secondary | ICD-10-CM | POA: Diagnosis not present

## 2021-08-28 DIAGNOSIS — J449 Chronic obstructive pulmonary disease, unspecified: Secondary | ICD-10-CM | POA: Diagnosis not present

## 2021-08-28 LAB — BASIC METABOLIC PANEL
Anion gap: 10 (ref 5–15)
BUN: 23 mg/dL (ref 8–23)
CO2: 24 mmol/L (ref 22–32)
Calcium: 9.9 mg/dL (ref 8.9–10.3)
Chloride: 106 mmol/L (ref 98–111)
Creatinine, Ser: 1.12 mg/dL — ABNORMAL HIGH (ref 0.44–1.00)
GFR, Estimated: 48 mL/min — ABNORMAL LOW (ref >60)
Glucose, Bld: 124 mg/dL — ABNORMAL HIGH (ref 70–99)
Potassium: 4.8 mmol/L (ref 3.5–5.1)
Sodium: 140 mmol/L (ref 135–145)

## 2021-08-28 NOTE — Telephone Encounter (Signed)
Could you please call this patient and schedule her a follow up appointment with Dr. Quentin Ore? Thank you so much.

## 2021-08-28 NOTE — Patient Instructions (Addendum)
Compression hose during the day if able to get on  Medication Instructions:  No changes  Ask Dr. Rosanna Randy about gabapentin/Neurontin for foot neuropathy  If you need a refill on your cardiac medications before your next appointment, please call your pharmacy.   Lab work:  Today: BMP   Medical Mall Entrance at [facility] 1st desk on the right to check in (REGISTRATION)  Lab hours: Monday- Friday (7:30 am- 5:30 pm)   Testing/Procedures: No new testing needed  Follow-Up: At [facility], you and your health needs are our priority.  As part of our continuing mission to provide you with exceptional heart care, we have created designated Provider Care Teams.  These Care Teams include your primary Cardiologist (physician) and Advanced Practice Providers (APPs -  Physician Assistants and Nurse Practitioners) who all work together to provide you with the care you need, when you need it.  You will need a follow up appointment in 12 months  Providers on your designated Care Team:   Murray Hodgkins, NP Christell Faith, PA-C [place] Kathlen Mody, [place]  COVID-19 Vaccine Information can be found at: ShippingScam.co.uk For questions related to vaccine distribution or appointments, please email [email or web address] or call [phone].

## 2021-08-29 NOTE — Telephone Encounter (Signed)
8/9 at 10:40

## 2021-08-30 ENCOUNTER — Encounter: Payer: Self-pay | Admitting: Family Medicine

## 2021-08-30 ENCOUNTER — Ambulatory Visit (INDEPENDENT_AMBULATORY_CARE_PROVIDER_SITE_OTHER): Payer: Medicare Other | Admitting: Family Medicine

## 2021-08-30 VITALS — BP 133/52 | HR 71 | Resp 16 | Wt 176.0 lb

## 2021-08-30 DIAGNOSIS — Z79899 Other long term (current) drug therapy: Secondary | ICD-10-CM

## 2021-08-30 DIAGNOSIS — I10 Essential (primary) hypertension: Secondary | ICD-10-CM | POA: Diagnosis not present

## 2021-08-30 DIAGNOSIS — E7800 Pure hypercholesterolemia, unspecified: Secondary | ICD-10-CM | POA: Diagnosis not present

## 2021-08-30 DIAGNOSIS — M5116 Intervertebral disc disorders with radiculopathy, lumbar region: Secondary | ICD-10-CM | POA: Diagnosis not present

## 2021-08-30 DIAGNOSIS — M545 Low back pain, unspecified: Secondary | ICD-10-CM

## 2021-08-30 DIAGNOSIS — G629 Polyneuropathy, unspecified: Secondary | ICD-10-CM | POA: Diagnosis not present

## 2021-08-30 DIAGNOSIS — I700 Atherosclerosis of aorta: Secondary | ICD-10-CM | POA: Diagnosis not present

## 2021-08-30 DIAGNOSIS — G8929 Other chronic pain: Secondary | ICD-10-CM

## 2021-08-30 DIAGNOSIS — J441 Chronic obstructive pulmonary disease with (acute) exacerbation: Secondary | ICD-10-CM | POA: Diagnosis not present

## 2021-08-30 MED ORDER — GABAPENTIN 100 MG PO CAPS: 100.0000 mg | ORAL_CAPSULE | Freq: Three times a day (TID) | ORAL | 5 refills | Status: DC

## 2021-08-30 MED ORDER — AMIODARONE HCL 200 MG PO TABS: 200.0000 mg | ORAL_TABLET | Freq: Every day | ORAL | 1 refills | Status: DC

## 2021-08-30 NOTE — Progress Notes (Signed)
Established patient visit  I,Kathryn [name],acting as a scribe for Kathryn Durie, MD.,have documented all relevant documentation on the behalf of Kathryn Durie, MD,as directed by  Kathryn Durie, MD while in the presence of Kathryn Durie, MD.   Patient: Kathryn [name]   DOB: [birth year]   87 y.o. Female  MRN: [id number] Visit Date: 08/30/2021  Today's healthcare provider: [name], MD   Chief Complaint  Patient presents with   Follow-up   Diabetes   Subjective    HPI  She comes in today for follow-up of chronic medical problems.  She complains of chronic pain and her neuropathy is bothering her at night. She also states that she needs to see cardiology/EP for her refill her amiodarone.  Called yesterday and could not get an appointment until August.  Overall she is feeling well. Other problems appear to be stable. Diabetes Mellitus Type II, Follow-up  NOTE: Last A1C done on 06/12/21.  Patient is due for diabetic foot exam.  Lab Results  Component Value Date   HGBA1C 7.8 (A) 06/12/2021   HGBA1C 7.8 (H) 12/23/2020   HGBA1C 7.6 (H) 12/22/2020   Wt Readings from Last 3 Encounters:  08/30/21 176 lb (79.8 kg)  08/28/21 176 lb 2 oz (79.9 kg)  07/17/21 180 lb (81.6 kg)   Last seen for diabetes 3 months ago.  Management since then includes referral to [facility]. Home blood sugar records: fasting range: not checking  Most Recent Eye Exam: 12/28/20.    Pertinent Labs: Lab Results  Component Value Date   CHOL 177 10/03/2020   HDL 55 10/03/2020   LDLCALC 83 10/03/2020   TRIG 239 (H) 10/03/2020   CHOLHDL 3.2 10/03/2020   Lab Results  Component Value Date   NA 140 08/28/2021   K 4.8 08/28/2021   CREATININE 1.12 (H) 08/28/2021   GFRNONAA 48 (L) 08/28/2021   LABMICR 17.8 11/15/2016     ---------------------------------------------------------------------------------------------------   Medications: Outpatient Medications  Prior to Visit  Medication Sig   acetaminophen (TYLENOL) 500 MG tablet Take 1,000 mg by mouth every 6 (six) hours as needed. Pain    albuterol (PROVENTIL) (2.5 MG/3ML) 0.083% nebulizer solution Take 3 mLs (2.5 mg total) by nebulization every 6 (six) hours as needed for wheezing or shortness of breath.   amiodarone (PACERONE) 200 MG tablet TAKE 1 TABLET(200 MG) BY MOUTH DAILY AS DIRECTED   aspirin 81 MG EC tablet Take 81 mg by mouth daily. Swallow whole.   atorvastatin (LIPITOR) 40 MG tablet TAKE 1 TABLET(40 MG) BY MOUTH DAILY   carvedilol (COREG) 6.25 MG tablet Take 1 tablet (6.25 mg total) by mouth 2 (two) times daily with a meal.   FARXIGA 10 MG TABS tablet TAKE 1 TABLET(10 MG) BY MOUTH DAILY BEFORE BREAKFAST   fluorouracil (EFUDEX) 5 % cream Apply topically 2 (two) times daily. Thin coat bid to bilateral temples for 7 days   glucose blood (CONTOUR NEXT TEST) test strip Check sugar once daily  DX E11.9   hydrOXYzine (ATARAX) 10 MG tablet TAKE 1 TO 2 TABLETS(10 TO 20 MG) BY MOUTH EVERY 6 HOURS AS NEEDED   levothyroxine (SYNTHROID) 100 MCG tablet TAKE 1 TABLET(100 MCG) BY MOUTH DAILY BEFORE BREAKFAST   losartan (COZAAR) 25 MG tablet Take 1 tablet (25 mg total) by mouth every evening.   metFORMIN (GLUCOPHAGE) 1000 MG tablet TAKE 1 TABLET(1000 MG) BY MOUTH DAILY WITH SUPPER   mupirocin ointment (BACTROBAN) 2 %  Apply 1 application. topically daily. Qd to excision site   omeprazole (PRILOSEC) 20 MG capsule TAKE 1 CAPSULE(20 MG) BY MOUTH DAILY   SPIRIVA HANDIHALER 18 MCG inhalation capsule PLACE 1 CAPSULE INTO INHALER AND INHALE EVERY DAY AS NEEDED   Torsemide 40 MG TABS Take 40 mg by mouth daily. May take extra 20 mg (1/2 tab) after lunch as needed for swelling   No facility-administered medications prior to visit.    Review of Systems  Last hemoglobin A1c Lab Results  Component Value Date   HGBA1C 7.8 (A) 06/12/2021       Objective    BP (!) 133/52 (BP Location: Right Arm, Patient  Position: Sitting, Cuff Size: Large)   Pulse 71   Resp 16   Wt 176 lb (79.8 kg)   SpO2 98%   BMI 30.21 kg/m  BP Readings from Last 3 Encounters:  09/02/21 (!) 151/71  08/30/21 (!) 133/52  08/28/21 (!) 108/50   Wt Readings from Last 3 Encounters:  09/02/21 175 lb (79.4 kg)  08/30/21 176 lb (79.8 kg)  08/28/21 176 lb 2 oz (79.9 kg)      Physical Exam Vitals and nursing note reviewed.  Constitutional:      Appearance: Normal appearance. She is normal weight.  HENT:     Right Ear: Tympanic membrane normal.     Left Ear: Tympanic membrane normal.     Nose: Nose normal.     Mouth/Throat:     Mouth: Mucous membranes are moist.     Pharynx: Oropharynx is clear.  Eyes:     General: No scleral icterus.    Conjunctiva/sclera: Conjunctivae normal.  Cardiovascular:     Rate and Rhythm: Normal rate and regular rhythm.     Pulses: Normal pulses.     Heart sounds: Normal heart sounds.  Pulmonary:     Effort: Pulmonary effort is normal.     Breath sounds: Normal breath sounds.  Abdominal:     General: Bowel sounds are normal.     Palpations: Abdomen is soft.  Musculoskeletal:     Cervical back: Normal range of motion and neck supple.     Comments: Trace. pedal edema in ankles  Skin:    General: Skin is warm and dry.  Neurological:     Mental Status: She is alert.  Psychiatric:        Mood and Affect: Mood normal.        Behavior: Behavior normal.        Thought Content: Thought content normal.        Judgment: Judgment normal.       No results found for any visits on 08/30/21.  Assessment & Plan     1. Neuropathy Try gabapentin 100 mg at bedtime adding 1 weekly see if she can get symptom control as she sleeps. - gabapentin (NEURONTIN) 100 MG capsule; Take 1 capsule (100 mg total) by mouth 3 (three) times daily.  Dispense: 90 capsule; Refill: 5  2. Aortic atherosclerosis (HCC) Risk factors treated  3. Essential hypertension Excellent blood pressure control.  4.  COPD exacerb.prov ation ([place]) Clinically stable.  Patient quit smoking years ago.  5. Lumbar disc disease with radiculopathy   6. Chronic right-sided low back pain without sciatica   7. Hypercholesteremia On atorvastatin 40  8. On amiodarone therapy Amiodarone for 2 months until she can get in with cardiology. He knows she needs to follow-up with EP.   No follow-ups on file.  I, Kathryn Durie, MD, have reviewed all documentation for this visit. The documentation on 09/08/21 for the exam, diagnosis, procedures, and orders are all accurate and complete.    Garrette Caine Cranford Mon, MD  [facility] [phone] (phone) [phone] (fax)  [place]

## 2021-08-30 NOTE — Addendum Note (Signed)
Addended by: Anselm Pancoast on: 08/30/2021 11:26 AM   Modules accepted: Orders

## 2021-09-02 ENCOUNTER — Other Ambulatory Visit: Payer: Self-pay

## 2021-09-02 ENCOUNTER — Encounter: Payer: Self-pay | Admitting: Emergency Medicine

## 2021-09-02 ENCOUNTER — Emergency Department: Payer: Medicare Other

## 2021-09-02 ENCOUNTER — Emergency Department
Admission: EM | Admit: 2021-09-02 | Discharge: 2021-09-02 | Disposition: A | Discharge status: Home / Self Care | Payer: Medicare Other | Attending: Emergency Medicine | Admitting: Emergency Medicine

## 2021-09-02 DIAGNOSIS — S0990XA Unspecified injury of head, initial encounter: Secondary | ICD-10-CM | POA: Diagnosis present

## 2021-09-02 DIAGNOSIS — Z8679 Personal history of other diseases of the circulatory system: Secondary | ICD-10-CM | POA: Diagnosis not present

## 2021-09-02 DIAGNOSIS — W108XXA Fall (on) (from) other stairs and steps, initial encounter: Secondary | ICD-10-CM | POA: Diagnosis not present

## 2021-09-02 DIAGNOSIS — Y9301 Activity, walking, marching and hiking: Secondary | ICD-10-CM | POA: Diagnosis not present

## 2021-09-02 DIAGNOSIS — M4312 Spondylolisthesis, cervical region: Secondary | ICD-10-CM | POA: Diagnosis not present

## 2021-09-02 DIAGNOSIS — S0003XA Contusion of scalp, initial encounter: Secondary | ICD-10-CM | POA: Insufficient documentation

## 2021-09-02 DIAGNOSIS — M25512 Pain in left shoulder: Secondary | ICD-10-CM | POA: Insufficient documentation

## 2021-09-02 DIAGNOSIS — J449 Chronic obstructive pulmonary disease, unspecified: Secondary | ICD-10-CM | POA: Diagnosis not present

## 2021-09-02 DIAGNOSIS — S199XXA Unspecified injury of neck, initial encounter: Secondary | ICD-10-CM | POA: Diagnosis not present

## 2021-09-02 DIAGNOSIS — S066X0A Traumatic subarachnoid hemorrhage without loss of consciousness, initial encounter: Secondary | ICD-10-CM | POA: Diagnosis not present

## 2021-09-02 DIAGNOSIS — M25511 Pain in right shoulder: Secondary | ICD-10-CM | POA: Insufficient documentation

## 2021-09-02 DIAGNOSIS — I509 Heart failure, unspecified: Secondary | ICD-10-CM | POA: Insufficient documentation

## 2021-09-02 DIAGNOSIS — E119 Type 2 diabetes mellitus without complications: Secondary | ICD-10-CM | POA: Insufficient documentation

## 2021-09-02 DIAGNOSIS — G319 Degenerative disease of nervous system, unspecified: Secondary | ICD-10-CM | POA: Diagnosis not present

## 2021-09-02 DIAGNOSIS — M47812 Spondylosis without myelopathy or radiculopathy, cervical region: Secondary | ICD-10-CM | POA: Diagnosis not present

## 2021-09-02 DIAGNOSIS — I609 Nontraumatic subarachnoid hemorrhage, unspecified: Secondary | ICD-10-CM

## 2021-09-02 MED ORDER — ACETAMINOPHEN 325 MG PO TABS
650.0000 mg | ORAL_TABLET | Freq: Once | ORAL | Status: AC
  Administered 2021-09-02: 650 mg via ORAL
  Filled 2021-09-02: qty 2

## 2021-09-02 NOTE — ED Notes (Signed)
Pt cleared for discharge, awaiting pickup.

## 2021-09-02 NOTE — ED Provider Notes (Signed)
[facility] Provider Note    Event Date/Time   First MD Initiated Contact with Patient 09/02/21 1157     (approximate)   History   Fall   HPI  Kathryn [name] is a 88 y.o. female with a history of CHF, COPD, diabetes who presents after a mechanical fall.  Patient reports she was walking up 3 steps, lost her balance and fell backwards.  She hit the back of her head.  Denies LOC.  No vomiting.  No back pain, no lower extremity injury reported.  Mild soreness of her shoulders bilaterally.  No difficulty breathing no abdominal pain.  Not on blood thinners     Physical Exam   Triage Vital Signs: ED Triage Vitals [09/02/21 1123]  Enc Vitals Group     BP 104/83     Pulse Rate 83     Resp 20     Temp 98.2 F (36.8 C)     Temp Source Oral     SpO2 94 %     Weight 79.4 kg (175 lb)     Height 1.626 m ('5\' 4"'$ )     Head Circumference      Peak Flow      Pain Score 5     Pain Loc      Pain Edu?      Excl. in [place]?     Most recent vital signs: Vitals:   09/02/21 1315 09/02/21 1400  BP: (!) 136/58 137/65  Pulse: 80   Resp: 16   Temp:    SpO2: 94%      General: Awake, no distress.  CV:  Good peripheral perfusion.  Resp:  Normal effort.  Abd:  No distention.  Other:  No vertebral tenderness palpation, no pain with axial load on both lower extremities, normal range of motion of all extremities, Head: Abrasion/hematoma to the posterior scalp, no cervical tenderness palpation   ED Results / Procedures / Treatments   Labs (all labs ordered are listed, but only abnormal results are displayed) Labs Reviewed - No data to display   EKG     RADIOLOGY CT cervical spine viewed interpret by me, no fracture noted, pending radiology review  Contacted by radiologist regarding subarachnoid hemorrhage on CT head    PROCEDURES:  Critical Care performed: yes  CRITICAL CARE Performed by: Lavonia Drafts   Total critical care time: 30  minutes  Critical care time was exclusive of separately billable procedures and treating other patients.  Critical care was necessary to treat or prevent imminent or life-threatening deterioration.  Critical care was time spent personally by me on the following activities: development of treatment plan with patient and/or surrogate as well as nursing, discussions with consultants, evaluation of patient's response to treatment, examination of patient, obtaining history from patient or surrogate, ordering and performing treatments and interventions, ordering and review of laboratory studies, ordering and review of radiographic studies, pulse oximetry and re-evaluation of patient's condition.   Procedures   MEDICATIONS ORDERED IN ED: Medications - No data to display   IMPRESSION / MDM / [place] / ED COURSE  I reviewed the triage vital signs and the nursing notes. Patient's presentation is most consistent with acute presentation with potential threat to life or bodily function.  Patient presents after mechanical fall with significant head injury.  Differential includes cervical fracture, skull fracture, intracranial hemorrhage, concussion  Overall exam is reassuring, no neurodeficits.  Hematoma with mild abrasion to the posterior scalp.  Patient is not on blood thinners.  Sent for CT head and cervical spine  Contacted by radiologist and informed of small subarachnoid hemorrhage on CT head  Discussed with Dr. Cari Caraway of neurosurgery who recommends repeat CT in 6 hours, if same or better appropriate for discharge  Repeat CT ordered      FINAL CLINICAL IMPRESSION(S) / ED DIAGNOSES   Final diagnoses:  Subarachnoid hemorrhage ([place])     Rx / DC Orders   ED Discharge Orders     None        Note:  This document was prepared using Dragon voice recognition software and may include unintentional dictation errors.   Lavonia Drafts, MD 09/02/21 1423

## 2021-09-02 NOTE — ED Notes (Signed)
Pt complaint of headache. MD made aware.

## 2021-09-02 NOTE — ED Provider Notes (Signed)
Emergency department handoff note  Care of this patient was signed out to me at the end of the previous provider shift.  All pertinent patient information was conveyed and all questions were answered.  Patient pending repeat CT of the head with concerns for worsening subarachnoid hemorrhage.  Repeat CT did not show any evidence of worsening hemorrhage and therefore patient is stable for discharge at this time with neurosurgery follow-up.  The patient has been reexamined and is ready to be discharged.  All diagnostic results have been reviewed and discussed with the patient/family.  Care plan has been outlined and the patient/family understands all current diagnoses, results, and treatment plans.  There are no new complaints, changes, or physical findings at this time.  All questions have been addressed and answered.  Patient was instructed to, and agrees to follow-up with their primary care physician as well as return to the emergency department if any new or worsening symptoms develop.   Naaman Plummer, MD 09/02/21 2111

## 2021-09-02 NOTE — ED Notes (Signed)
Pt ambulates to the bathroom with assistance and returned to bed.

## 2021-09-02 NOTE — ED Notes (Signed)
Pt alert and oriented, speaking in full sentences, breathing easy and unlabored, call bell at the bedside, advise pt to call for help.

## 2021-09-02 NOTE — ED Notes (Signed)
Pt still complaint of a severe headache.

## 2021-09-02 NOTE — ED Notes (Signed)
Received report from Crystal RN.

## 2021-09-02 NOTE — ED Triage Notes (Signed)
Pt via POV from home. Pt states she fell backwards off a porch, states its about 3 steps high. Pt did land on cement. Pt has a hematoma to the back of her head. Denies any LOC. Denies blood thinner. Pt has skin tears to the R elbow and R ankle. Bleeding controlled. Pt is A&Ox4 and NAD

## 2021-09-02 NOTE — ED Notes (Signed)
RN JS cleaned and dressed skin tears on right lateral ankle and right lateral elbow.

## 2021-09-12 DIAGNOSIS — M19011 Primary osteoarthritis, right shoulder: Secondary | ICD-10-CM | POA: Diagnosis not present

## 2021-09-12 DIAGNOSIS — M19012 Primary osteoarthritis, left shoulder: Secondary | ICD-10-CM | POA: Diagnosis not present

## 2021-09-18 ENCOUNTER — Other Ambulatory Visit (HOSPITAL_COMMUNITY): Payer: Self-pay

## 2021-09-18 ENCOUNTER — Other Ambulatory Visit: Payer: Self-pay | Admitting: Family Medicine

## 2021-09-19 ENCOUNTER — Other Ambulatory Visit (HOSPITAL_COMMUNITY): Payer: Self-pay

## 2021-09-19 ENCOUNTER — Encounter (HOSPITAL_COMMUNITY): Payer: Self-pay

## 2021-09-20 ENCOUNTER — Telehealth (HOSPITAL_COMMUNITY): Payer: Self-pay

## 2021-09-20 NOTE — Telephone Encounter (Signed)
Today contacted Kathryn [name] about being positive for orthostatics yesterday and still having dizziness after her fall.  She states yesterday the dizziness all of sudden got better.  She states today she is fine.  Advised her if comes back to contact her PCP.  She states just took time after her fall to continue to heal she guess.  She states she is glad she can finally do some things around the home where before she could not due to dizziness.  She states just feels good today.  She states gonna sit outside some and read a book.  She is drinking enough and no signs of dehydration.  She has all her medications.  Will continue to visit for heart failure, diet and medication compliance.   [place] [phone]

## 2021-09-20 NOTE — Progress Notes (Signed)
Had a Art therapist of mine do a home visit with [employer].  She had a fall a couple of weeks ago and she states been dizziness since.  She is positive for orthostatics.  She states had a telephone visit with neuro and since she had no headaches there was no need for a followup.  She states she has been real careful walking and holding on to things not to fall.  She is drinking plenty of fluids.  No weight gain.  She has no extremity or abdominal edema.  She denies any chest pain.  She has all her medications and aware of how to take them.  She is aware of up coming appts.  She has good family support.  She lives with her son in a attached apartment to his home. She has everything for daily living.  Will continue to visit for heart failure, diet and medication management.   [place] [phone]

## 2021-09-27 ENCOUNTER — Ambulatory Visit
Admission: RE | Admit: 2021-09-27 | Discharge: 2021-09-27 | Disposition: A | Discharge status: Home / Self Care | Payer: Medicare Other | Attending: Family Medicine | Admitting: Family Medicine

## 2021-09-27 ENCOUNTER — Ambulatory Visit
Admission: RE | Admit: 2021-09-27 | Discharge: 2021-09-27 | Disposition: A | Discharge status: Home / Self Care | Payer: Medicare Other | Source: Ambulatory Visit | Attending: Family Medicine | Admitting: Family Medicine

## 2021-09-27 ENCOUNTER — Encounter: Payer: Self-pay | Admitting: Family Medicine

## 2021-09-27 ENCOUNTER — Ambulatory Visit (INDEPENDENT_AMBULATORY_CARE_PROVIDER_SITE_OTHER): Payer: Medicare Other | Admitting: Family Medicine

## 2021-09-27 VITALS — BP 98/45 | HR 74 | Temp 98.3°F | Resp 16

## 2021-09-27 DIAGNOSIS — M542 Cervicalgia: Secondary | ICD-10-CM | POA: Insufficient documentation

## 2021-09-27 DIAGNOSIS — I5043 Acute on chronic combined systolic (congestive) and diastolic (congestive) heart failure: Secondary | ICD-10-CM

## 2021-09-27 DIAGNOSIS — E039 Hypothyroidism, unspecified: Secondary | ICD-10-CM | POA: Diagnosis not present

## 2021-09-27 DIAGNOSIS — E7800 Pure hypercholesterolemia, unspecified: Secondary | ICD-10-CM

## 2021-09-27 DIAGNOSIS — I10 Essential (primary) hypertension: Secondary | ICD-10-CM

## 2021-09-27 DIAGNOSIS — E119 Type 2 diabetes mellitus without complications: Secondary | ICD-10-CM | POA: Diagnosis not present

## 2021-09-27 DIAGNOSIS — M545 Low back pain, unspecified: Secondary | ICD-10-CM | POA: Diagnosis not present

## 2021-09-27 DIAGNOSIS — G8929 Other chronic pain: Secondary | ICD-10-CM | POA: Diagnosis not present

## 2021-09-27 MED ORDER — HYDROCODONE-ACETAMINOPHEN 5-325 MG PO TABS
1.0000 | ORAL_TABLET | Freq: Four times a day (QID) | ORAL | 0 refills | Status: DC | PRN
Start: 2021-09-27 — End: 2021-12-27

## 2021-09-27 NOTE — Patient Instructions (Signed)
HOLD TORSEMIDE.

## 2021-09-27 NOTE — Progress Notes (Unsigned)
Established patient visit   Patient: Kathryn [name]   DOB: 02-06-1934   88 y.o. Female  MRN: [id number] Visit Date: 09/27/2021  Today's healthcare provider: [name], MD   Chief Complaint  Patient presents with   Headache   Subjective    HPI  Patient here today C/O headache, pain behind her right ear radiating up and down her neck. Patient reports symptoms have been present for a few weeks. Following a fall at her sisters house from steps and landed on her head and back on concrete floor. Patient was seen at [facility] ED and had two scans done.  Due to the fall patient had a subarachnoid hemorrhage which was stable prior to her discharge from the ED on the second scan.  She has had no progressive logic symptoms.  He has has neck stiffness on the right lateral neck.  She is accompanied today by her sister.  Medications: Outpatient Medications Prior to Visit  Medication Sig   acetaminophen (TYLENOL) 500 MG tablet Take 1,000 mg by mouth every 6 (six) hours as needed. Pain    albuterol (PROVENTIL) (2.5 MG/3ML) 0.083% nebulizer solution Take 3 mLs (2.5 mg total) by nebulization every 6 (six) hours as needed for wheezing or shortness of breath.   amiodarone (PACERONE) 200 MG tablet Take 1 tablet (200 mg total) by mouth daily.   aspirin 81 MG EC tablet Take 81 mg by mouth daily. Swallow whole.   atorvastatin (LIPITOR) 40 MG tablet TAKE 1 TABLET(40 MG) BY MOUTH DAILY   carvedilol (COREG) 6.25 MG tablet Take 1 tablet (6.25 mg total) by mouth 2 (two) times daily with a meal.   FARXIGA 10 MG TABS tablet TAKE 1 TABLET(10 MG) BY MOUTH DAILY BEFORE BREAKFAST   fluorouracil (EFUDEX) 5 % cream Apply topically 2 (two) times daily. Thin coat bid to bilateral temples for 7 days   gabapentin (NEURONTIN) 100 MG capsule Take 1 capsule (100 mg total) by mouth 3 (three) times daily.   glucose blood (CONTOUR NEXT TEST) test strip Check sugar once daily  DX E11.9   hydrOXYzine (ATARAX) 10 MG  tablet TAKE 1 TO 2 TABLETS(10 TO 20 MG) BY MOUTH EVERY 6 HOURS AS NEEDED   levothyroxine (SYNTHROID) 100 MCG tablet TAKE 1 TABLET(100 MCG) BY MOUTH DAILY BEFORE BREAKFAST   losartan (COZAAR) 25 MG tablet Take 1 tablet (25 mg total) by mouth every evening.   metFORMIN (GLUCOPHAGE) 1000 MG tablet TAKE 1 TABLET(1000 MG) BY MOUTH DAILY WITH SUPPER   mupirocin ointment (BACTROBAN) 2 % Apply 1 application. topically daily. Qd to excision site   omeprazole (PRILOSEC) 20 MG capsule TAKE 1 CAPSULE(20 MG) BY MOUTH DAILY   SPIRIVA HANDIHALER 18 MCG inhalation capsule PLACE 1 CAPSULE INTO INHALER AND INHALE EVERY DAY AS NEEDED   Torsemide 40 MG TABS Take 40 mg by mouth daily. May take extra 20 mg (1/2 tab) after lunch as needed for swelling   No facility-administered medications prior to visit.    Review of Systems  Constitutional:  Positive for appetite change, chills and fatigue. Negative for fever.  Respiratory:  Positive for shortness of breath (with activity). Negative for cough, chest tightness and wheezing.   Cardiovascular:  Positive for leg swelling. Negative for chest pain and palpitations.  Gastrointestinal:  Negative for abdominal pain, blood in stool, constipation, diarrhea, nausea and vomiting.  Genitourinary:  Negative for dysuria and hematuria.  Musculoskeletal:  Positive for myalgias and neck pain. Negative for neck  stiffness.  Neurological:  Positive for dizziness, weakness, light-headedness and headaches.       Objective    BP (!) 98/45 (BP Location: Left Arm, Patient Position: Sitting, Cuff Size: Large)   Pulse 74   Temp 98.3 F (36.8 C) (Oral)   Resp 16   SpO2 97%  BP Readings from Last 3 Encounters:  09/27/21 (!) 98/45  09/19/21 (!) 138/92  09/02/21 (!) 151/71   Wt Readings from Last 3 Encounters:  09/02/21 175 lb (79.4 kg)  08/30/21 176 lb (79.8 kg)  08/28/21 176 lb 2 oz (79.9 kg)      Physical Exam Vitals and nursing note reviewed.  Constitutional:       Appearance: Normal appearance.  HENT:     Right Ear: Tympanic membrane normal.     Left Ear: Tympanic membrane normal.     Nose: Nose normal.     Mouth/Throat:     Mouth: Mucous membranes are moist.     Pharynx: Oropharynx is clear.  Eyes:     General: No scleral icterus.    Conjunctiva/sclera: Conjunctivae normal.  Cardiovascular:     Rate and Rhythm: Normal rate and regular rhythm.     Pulses: Normal pulses.     Heart sounds: Normal heart sounds.  Pulmonary:     Effort: Pulmonary effort is normal.     Breath sounds: Normal breath sounds.  Abdominal:     General: Bowel sounds are normal.     Palpations: Abdomen is soft.  Musculoskeletal:     Cervical back: Normal range of motion and neck supple.     Comments: Trace. pedal edema in ankles He has no tenderness over cervical spine.  Her discomfort is in her right lateral neck with the musculature.  Skin:    General: Skin is warm and dry.  Neurological:     Mental Status: She is alert.  Psychiatric:        Mood and Affect: Mood normal.        Behavior: Behavior normal.        Thought Content: Thought content normal.        Judgment: Judgment normal.       No results found for any visits on 09/27/21.  Assessment & Plan     1. Neck pain I think this is a neck strain on top of arthritic degenerative changes and degenerative disc disease.  Alternate ice and heating pad and any topical treatment patient wishes to try. - DG Cervical Spine Complete  2. Essential hypertension Patient hypotensive at this time.  At this time stop her torsemide  3. Acute on chronic combined systolic and diastolic CHF (congestive heart failure) (HCC) Hold torsemide for now.  4. Hypothyroidism, unspecified type   5. Type 2 diabetes mellitus without complication, without long-term current use of insulin (HCC) A1c on next visit when appropriate  6. Hypercholesteremia   7. Chronic right-sided low back pain without sciatica Patient with  chronic back pain has had problems with arthritis and DDD for decades   No follow-ups on file.      I, [name], MD, have reviewed all documentation for this visit. The documentation on 09/28/21 for the exam, diagnosis, procedures, and orders are all accurate and complete.    Shashana Fullington Cranford Mon, MD  [facility] [phone] (phone) [phone] (fax)  [place]

## 2021-10-03 NOTE — Progress Notes (Unsigned)
Established patient visit  I,[name],acting as a scribe for Kathryn Durie, MD.,have documented all relevant documentation on the behalf of Kathryn Durie, MD,as directed by  Kathryn Durie, MD while in the presence of Kathryn Durie, MD.   Patient: Kathryn [name]   DOB: Sep 05, 1933   88 y.o. Female  MRN: [id number] Visit Date: 10/04/2021  Today's healthcare provider: [name], MD   Chief Complaint  Patient presents with   Follow-up   Rash   Neck Pain   Subjective    HPI  We stopped her diuretic on the last visit because of hypotension.  She now has some dyspnea.  No orthopnea or PND or chest pain.  She has been short of breath for 1 day, no cough Also with the right-sided neck pain since then she has developed a rash on the right side of her neck.  Stinging and painful.  Follow up for Neck pain  The patient was last seen for this 1 weeks ago. Changes made at last visit include; Neck x-ray obtrained.  ----------------------------------------------------------------------------------------- Patient has a rash that appeared on right side neck and chest several days ago. Patient states rash stings and burns. Patient also has symptoms of shortness of breath.   Medications: Outpatient Medications Prior to Visit  Medication Sig   acetaminophen (TYLENOL) 500 MG tablet Take 1,000 mg by mouth every 6 (six) hours as needed. Pain    albuterol (PROVENTIL) (2.5 MG/3ML) 0.083% nebulizer solution Take 3 mLs (2.5 mg total) by nebulization every 6 (six) hours as needed for wheezing or shortness of breath.   amiodarone (PACERONE) 200 MG tablet Take 1 tablet (200 mg total) by mouth daily.   aspirin 81 MG EC tablet Take 81 mg by mouth daily. Swallow whole.   atorvastatin (LIPITOR) 40 MG tablet TAKE 1 TABLET(40 MG) BY MOUTH DAILY   carvedilol (COREG) 6.25 MG tablet Take 1 tablet (6.25 mg total) by mouth 2 (two) times daily with a meal.   FARXIGA 10 MG TABS  tablet TAKE 1 TABLET(10 MG) BY MOUTH DAILY BEFORE BREAKFAST   fluorouracil (EFUDEX) 5 % cream Apply topically 2 (two) times daily. Thin coat bid to bilateral temples for 7 days   gabapentin (NEURONTIN) 100 MG capsule Take 1 capsule (100 mg total) by mouth 3 (three) times daily.   glucose blood (CONTOUR NEXT TEST) test strip Check sugar once daily  DX E11.9   HYDROcodone-acetaminophen (NORCO) 5-325 MG tablet Take 1 tablet by mouth every 6 (six) hours as needed for moderate pain or severe pain.   hydrOXYzine (ATARAX) 10 MG tablet TAKE 1 TO 2 TABLETS(10 TO 20 MG) BY MOUTH EVERY 6 HOURS AS NEEDED   levothyroxine (SYNTHROID) 100 MCG tablet TAKE 1 TABLET(100 MCG) BY MOUTH DAILY BEFORE BREAKFAST   losartan (COZAAR) 25 MG tablet Take 1 tablet (25 mg total) by mouth every evening.   metFORMIN (GLUCOPHAGE) 1000 MG tablet TAKE 1 TABLET(1000 MG) BY MOUTH DAILY WITH SUPPER   mupirocin ointment (BACTROBAN) 2 % Apply 1 application. topically daily. Qd to excision site   omeprazole (PRILOSEC) 20 MG capsule TAKE 1 CAPSULE(20 MG) BY MOUTH DAILY   SPIRIVA HANDIHALER 18 MCG inhalation capsule PLACE 1 CAPSULE INTO INHALER AND INHALE EVERY DAY AS NEEDED   Torsemide 40 MG TABS Take 40 mg by mouth daily. May take extra 20 mg (1/2 tab) after lunch as needed for swelling   No facility-administered medications prior to visit.  Review of Systems  Constitutional:  Negative for appetite change, chills, fatigue and fever.  Respiratory:  Negative for chest tightness and shortness of breath.   Cardiovascular:  Negative for chest pain and palpitations.  Gastrointestinal:  Negative for abdominal pain, nausea and vomiting.  Neurological:  Negative for dizziness and weakness.       Objective    BP 132/66 (BP Location: Left Arm, Patient Position: Sitting, Cuff Size: Large)   Pulse 77   Resp 18   SpO2 97%    Physical Exam Vitals reviewed.  Constitutional:      General: She is not in acute distress.    Appearance:  She is well-developed.  HENT:     Head: Normocephalic and atraumatic.     Right Ear: Hearing normal.     Left Ear: Hearing normal.     Nose: Nose normal.  Eyes:     General: Lids are normal. No scleral icterus.       Right eye: No discharge.        Left eye: No discharge.     Conjunctiva/sclera: Conjunctivae normal.  Cardiovascular:     Rate and Rhythm: Normal rate and regular rhythm.     Heart sounds: Normal heart sounds.     Comments: No JVD Pulmonary:     Effort: Pulmonary effort is normal. No respiratory distress.  Skin:    General: Skin is warm and dry.     Findings: No lesion or rash.     Comments: Rash on the right and posterior neck down to about the clavicle consistent with a shingles  Neurological:     General: No focal deficit present.     Mental Status: She is alert and oriented to person, place, and time.  Psychiatric:        Mood and Affect: Mood normal.        Speech: Speech normal.        Behavior: Behavior normal.        Thought Content: Thought content normal.        Judgment: Judgment normal.       No results found for any visits on 10/04/21.  Assessment & Plan     1. Acute on chronic combined systolic and diastolic CHF (congestive heart failure) (HCC) Start furosemide 20 mg 2 every morning and follow-up in 1 week and will probably cut back to 1 daily. She was hypotensive on her last visit and we will watch that and we will watch her renal function and potassium - DG Chest 2 View  2. Herpes zoster without complication Treat with valacyclovir as she is 3 days into the rash.  She had the old shingles vaccine years ago and I think that should protect her from the pain syndrome.  Consider gabapentin if this happens. - valACYclovir (VALTREX) 1000 MG tablet; Take 1 tablet (1,000 mg total) by mouth 2 (two) times daily.  Dispense: 20 tablet; Refill: 0  3. Essential hypertension Good control  4. COPD, mild ([place]) Could be contributing to shortness of breath  with the heat outside but we will find out with the diuresis  5. Chronic right-sided low back pain without sciatica   6. Hypercholesteremia On statin in form of atorvastatin 40   Return in about 1 week (around 10/11/2021).      I, Kathryn Durie, MD, have reviewed all documentation for this visit. The documentation on 10/05/21 for the exam, diagnosis, procedures, and orders are all accurate and complete.  Nazyia Gaugh Cranford Mon, MD  [facility] [phone] (phone) [phone] (fax)  [place]

## 2021-10-04 ENCOUNTER — Ambulatory Visit
Admission: RE | Admit: 2021-10-04 | Discharge: 2021-10-04 | Disposition: A | Discharge status: Home / Self Care | Payer: Medicare Other | Source: Ambulatory Visit | Attending: Family Medicine | Admitting: Family Medicine

## 2021-10-04 ENCOUNTER — Ambulatory Visit (INDEPENDENT_AMBULATORY_CARE_PROVIDER_SITE_OTHER): Payer: Medicare Other | Admitting: Family Medicine

## 2021-10-04 ENCOUNTER — Ambulatory Visit
Admission: RE | Admit: 2021-10-04 | Discharge: 2021-10-04 | Disposition: A | Discharge status: Home / Self Care | Payer: Medicare Other | Attending: Family Medicine | Admitting: Family Medicine

## 2021-10-04 ENCOUNTER — Encounter: Payer: Self-pay | Admitting: Family Medicine

## 2021-10-04 VITALS — BP 132/66 | HR 77 | Resp 18

## 2021-10-04 DIAGNOSIS — I5043 Acute on chronic combined systolic (congestive) and diastolic (congestive) heart failure: Secondary | ICD-10-CM

## 2021-10-04 DIAGNOSIS — R0600 Dyspnea, unspecified: Secondary | ICD-10-CM | POA: Diagnosis not present

## 2021-10-04 DIAGNOSIS — M545 Low back pain, unspecified: Secondary | ICD-10-CM

## 2021-10-04 DIAGNOSIS — I10 Essential (primary) hypertension: Secondary | ICD-10-CM

## 2021-10-04 DIAGNOSIS — J449 Chronic obstructive pulmonary disease, unspecified: Secondary | ICD-10-CM | POA: Diagnosis not present

## 2021-10-04 DIAGNOSIS — E7800 Pure hypercholesterolemia, unspecified: Secondary | ICD-10-CM | POA: Diagnosis not present

## 2021-10-04 DIAGNOSIS — B029 Zoster without complications: Secondary | ICD-10-CM | POA: Diagnosis not present

## 2021-10-04 DIAGNOSIS — G8929 Other chronic pain: Secondary | ICD-10-CM

## 2021-10-04 MED ORDER — VALACYCLOVIR HCL 1 G PO TABS: 1000.0000 mg | ORAL_TABLET | Freq: Two times a day (BID) | ORAL | 0 refills | Status: DC

## 2021-10-04 NOTE — Patient Instructions (Signed)
RESTART FUROSEMIDE 20 MG 2 TABLETS IN THE MORNING.

## 2021-10-05 ENCOUNTER — Other Ambulatory Visit: Payer: Self-pay

## 2021-10-05 DIAGNOSIS — B029 Zoster without complications: Secondary | ICD-10-CM

## 2021-10-05 NOTE — Telephone Encounter (Signed)
Copied from [place] [id number]. Topic: General - Other >> Oct 05, 2021  9:16 AM Everette C wrote: Reason for CRM: The patient would like to speak with a member of clinical staff when possible  The patient has additional questions about medications they were newly prescribed valACYclovir (VALTREX) 1000 MG tablet [[id number]] as well as their visit yesterday   Please contact the patient further when possible

## 2021-10-05 NOTE — Telephone Encounter (Signed)
LMOVM to return call.

## 2021-10-06 NOTE — Telephone Encounter (Signed)
Advised pt she only needs to take Valtrex, unless rash is to painful. Per Sharen Counter advised pt that it would be okay to use hydrocortisone cream for itching. Also can use lidocaine patch or cream for pain.

## 2021-10-11 ENCOUNTER — Other Ambulatory Visit (HOSPITAL_COMMUNITY): Payer: Self-pay

## 2021-10-12 ENCOUNTER — Encounter: Payer: Self-pay | Admitting: Family Medicine

## 2021-10-12 ENCOUNTER — Ambulatory Visit (INDEPENDENT_AMBULATORY_CARE_PROVIDER_SITE_OTHER): Payer: Medicare Other | Admitting: Family Medicine

## 2021-10-12 VITALS — BP 120/70 | HR 82 | Resp 16

## 2021-10-12 DIAGNOSIS — B0229 Other postherpetic nervous system involvement: Secondary | ICD-10-CM

## 2021-10-12 DIAGNOSIS — G629 Polyneuropathy, unspecified: Secondary | ICD-10-CM

## 2021-10-12 DIAGNOSIS — I700 Atherosclerosis of aorta: Secondary | ICD-10-CM

## 2021-10-12 DIAGNOSIS — E1140 Type 2 diabetes mellitus with diabetic neuropathy, unspecified: Secondary | ICD-10-CM | POA: Diagnosis not present

## 2021-10-12 MED ORDER — GABAPENTIN 100 MG PO CAPS: 300.0000 mg | ORAL_CAPSULE | Freq: Three times a day (TID) | ORAL | 5 refills | Status: DC

## 2021-10-12 NOTE — Progress Notes (Signed)
Established patient visit  I,[name],acting as a scribe for [name], MD.,have documented all relevant documentation on the behalf of [name], MD,as directed by  [name], MD while in the presence of [name], MD.   Patient: Kathryn [name]   DOB: 1933/11/06   88 y.o. Female  MRN: [id number] Visit Date: 10/12/2021  Today's healthcare provider: [name], MD   Chief Complaint  Patient presents with   Herpes Zoster   Subjective    HPI  Follow up for Herpes zoster without complication:  The patient was last seen for this 1 weeks ago. Changes made at last visit include Treat with valacyclovir as she is 3 days into the rash.  She had the old shingles vaccine years ago and I think that should protect her from the pain syndrome.  Consider gabapentin if this happens.  She reports good compliance with treatment. She feels that condition is Unchanged. She is not having side effects. none  -----------------------------------------------------------------------------------------   Medications: Outpatient Medications Prior to Visit  Medication Sig   acetaminophen (TYLENOL) 500 MG tablet Take 1,000 mg by mouth every 6 (six) hours as needed. Pain    albuterol (PROVENTIL) (2.5 MG/3ML) 0.083% nebulizer solution Take 3 mLs (2.5 mg total) by nebulization every 6 (six) hours as needed for wheezing or shortness of breath.   amiodarone (PACERONE) 200 MG tablet Take 1 tablet (200 mg total) by mouth daily.   aspirin 81 MG EC tablet Take 81 mg by mouth daily. Swallow whole.   atorvastatin (LIPITOR) 40 MG tablet TAKE 1 TABLET(40 MG) BY MOUTH DAILY   carvedilol (COREG) 6.25 MG tablet Take 1 tablet (6.25 mg total) by mouth 2 (two) times daily with a meal.   FARXIGA 10 MG TABS tablet TAKE 1 TABLET(10 MG) BY MOUTH DAILY BEFORE BREAKFAST   fluorouracil (EFUDEX) 5 % cream Apply topically 2 (two) times daily. Thin coat bid to bilateral temples for  7 days   gabapentin (NEURONTIN) 100 MG capsule Take 1 capsule (100 mg total) by mouth 3 (three) times daily.   glucose blood (CONTOUR NEXT TEST) test strip Check sugar once daily  DX E11.9   HYDROcodone-acetaminophen (NORCO) 5-325 MG tablet Take 1 tablet by mouth every 6 (six) hours as needed for moderate pain or severe pain.   hydrOXYzine (ATARAX) 10 MG tablet TAKE 1 TO 2 TABLETS(10 TO 20 MG) BY MOUTH EVERY 6 HOURS AS NEEDED   levothyroxine (SYNTHROID) 100 MCG tablet TAKE 1 TABLET(100 MCG) BY MOUTH DAILY BEFORE BREAKFAST   losartan (COZAAR) 25 MG tablet Take 1 tablet (25 mg total) by mouth every evening.   metFORMIN (GLUCOPHAGE) 1000 MG tablet TAKE 1 TABLET(1000 MG) BY MOUTH DAILY WITH SUPPER   mupirocin ointment (BACTROBAN) 2 % Apply 1 application. topically daily. Qd to excision site   omeprazole (PRILOSEC) 20 MG capsule TAKE 1 CAPSULE(20 MG) BY MOUTH DAILY   SPIRIVA HANDIHALER 18 MCG inhalation capsule PLACE 1 CAPSULE INTO INHALER AND INHALE EVERY DAY AS NEEDED   Torsemide 40 MG TABS Take 40 mg by mouth daily. May take extra 20 mg (1/2 tab) after lunch as needed for swelling   valACYclovir (VALTREX) 1000 MG tablet Take 1 tablet (1,000 mg total) by mouth 2 (two) times daily.   No facility-administered medications prior to visit.    Review of Systems  Constitutional:  Negative for appetite change, chills, fatigue and fever.  Respiratory:  Negative for chest tightness and  shortness of breath.   Cardiovascular:  Negative for chest pain and palpitations.  Gastrointestinal:  Negative for abdominal pain, nausea and vomiting.  Neurological:  Negative for dizziness and weakness.    Last CBC Lab Results  Component Value Date   WBC 5.7 01/11/2021   HGB 11.2 01/11/2021   HCT 37.1 01/11/2021   MCV 83 01/11/2021   MCH 25.1 (L) 01/11/2021   RDW 15.7 (H) 01/11/2021   PLT 341 01/11/2021       Objective    BP 120/70 (BP Location: Left Arm, Patient Position: Sitting, Cuff Size: Normal)    Pulse 82   Resp 16   SpO2 95%  BP Readings from Last 3 Encounters:  10/12/21 120/70  10/04/21 132/66  09/27/21 (!) 98/45   Wt Readings from Last 3 Encounters:  09/02/21 175 lb (79.4 kg)  08/30/21 176 lb (79.8 kg)  08/28/21 176 lb 2 oz (79.9 kg)      Physical Exam Vitals reviewed.  Constitutional:      General: She is not in acute distress.    Appearance: She is well-developed.  HENT:     Head: Normocephalic and atraumatic.     Right Ear: Hearing normal.     Left Ear: Hearing normal.     Nose: Nose normal.  Eyes:     General: Lids are normal. No scleral icterus.       Right eye: No discharge.        Left eye: No discharge.     Conjunctiva/sclera: Conjunctivae normal.  Cardiovascular:     Rate and Rhythm: Normal rate and regular rhythm.     Heart sounds: Normal heart sounds.  Pulmonary:     Effort: Pulmonary effort is normal. No respiratory distress.  Skin:    Findings: No lesion or rash.  Neurological:     General: No focal deficit present.     Mental Status: She is alert and oriented to person, place, and time.  Psychiatric:        Mood and Affect: Mood normal.        Speech: Speech normal.        Behavior: Behavior normal.        Thought Content: Thought content normal.        Judgment: Judgment normal.       No results found for any visits on 10/12/21.  Assessment & Plan     1. Postherpetic neuralgia Increase gabapentin to 100 mg, 3 twice daily  2. Neuropathy  - gabapentin (NEURONTIN) 100 MG capsule; Take 3 capsules (300 mg total) by mouth 3 (three) times daily.  Dispense: 180 capsule; Refill: 5  3. Type 2 diabetes mellitus with diabetic neuropathy, without long-term current use of insulin ([place])   4. Aortic atherosclerosis ([place])  All risk factors treated  No follow-ups on file.      I, [name], MD, have reviewed all documentation for this visit. The documentation on 10/18/21 for the exam, diagnosis, procedures, and orders are all  accurate and complete.    Kayton Ripp Cranford Mon, MD  [facility] [phone] (phone) [phone] (fax)  [place]

## 2021-10-12 NOTE — Telephone Encounter (Signed)
Pt called requesting a refill of valACYclovir (VALTREX) 1000 MG tablet  She says she needs this most importantly, just had an appt today.

## 2021-10-12 NOTE — Addendum Note (Signed)
Addended by: Matilde Sprang on: 10/12/2021 05:34 PM   Modules accepted: Orders

## 2021-10-12 NOTE — Telephone Encounter (Signed)
Requested medication (s) are due for refill today: Yes  Requested medication (s) are on the active medication list: Yes  Last refill:  10/04/21  Future visit scheduled: No  Notes to clinic:  Unsure if provider wants to refill, routing for approval, OV 10/12/21     Requested Prescriptions  Pending Prescriptions Disp Refills   valACYclovir (VALTREX) 1000 MG tablet 20 tablet 0    Sig: Take 1 tablet (1,000 mg total) by mouth 2 (two) times daily.     Antimicrobials:  Antiviral Agents - Anti-Herpetic Passed - 10/12/2021  5:34 PM      Passed - Valid encounter within last 12 months    Recent Outpatient Visits           Today Neuropathy   [facility] Jerrol Banana., MD   1 week ago Acute on chronic combined systolic and diastolic CHF (congestive heart failure) [facility])   [facility]   2 weeks ago Neck pain   [facility] Jerrol Banana., MD   1 month ago Neuropathy   [facility] Jerrol Banana., MD   4 months ago Type 2 diabetes mellitus without complication, without long-term current use of insulin [facility])   [facility] Jerrol Banana., MD       Future Appointments             In 2 weeks Jerrol Banana., MD [facility], [place]   In 2 weeks Vickie Epley, MD [facility], [place]   In 2 months Jerrol Banana., MD [facility], [place]   In 2 months Ralene Bathe, MD [place]

## 2021-10-16 NOTE — Progress Notes (Signed)
Contacted Kathryn [name] to check on her.  She states doing ok but the shingles are still sore.  She has appt with PCP tomorrow.  She states just getting slower.  She states she does what she can and that's enough right now.  She stays home except for appts.  She has family close to help her.  She has everything for daily living.  She states not able to eat much but is eating.  She denies any chest pain, headaches, dizziness or increased shortness of breath.  She states weight has stayed the same.  She states has all her medications.  She appears to be in a good mood.  She states slid off her bed and bruised her arm, will have PCP look at it.  Will keep visiting for heart failure, diet and medication management.   [place] [phone]

## 2021-10-21 ENCOUNTER — Other Ambulatory Visit: Payer: Self-pay | Admitting: Family Medicine

## 2021-10-21 DIAGNOSIS — J069 Acute upper respiratory infection, unspecified: Secondary | ICD-10-CM

## 2021-10-22 ENCOUNTER — Other Ambulatory Visit: Payer: Self-pay | Admitting: Cardiovascular Disease

## 2021-10-23 NOTE — Telephone Encounter (Signed)
Requested Prescriptions  Pending Prescriptions Disp Refills  . albuterol (PROVENTIL) (2.5 MG/3ML) 0.083% nebulizer solution [Pharmacy Med Name: ALBUTEROL 0.083%(2.'5MG'$ /3ML) 25X3ML] 150 mL 12    Sig: USE 1 VIAL VIA NEBULIZER EVERY 6 HOURS AS NEEDED FOR WHEEZING OR SHORTNESS OF BREATH     Pulmonology:  Beta Agonists 2 Passed - 10/21/2021 10:13 AM      Passed - Last BP in normal range    BP Readings from Last 1 Encounters:  10/12/21 120/70         Passed - Last Heart Rate in normal range    Pulse Readings from Last 1 Encounters:  10/12/21 82         Passed - Valid encounter within last 12 months    Recent Outpatient Visits          1 week ago Postherpetic neuralgia   [facility] Jerrol Banana., MD   2 weeks ago Acute on chronic combined systolic and diastolic CHF (congestive heart failure) [facility])   [facility] Jerrol Banana., MD   3 weeks ago Neck pain   [facility] Jerrol Banana., MD   1 month ago Neuropathy   [facility] Jerrol Banana., MD   4 months ago Type 2 diabetes mellitus without complication, without long-term current use of insulin [facility]   [facility] Jerrol Banana., MD      Future Appointments            In 3 days Jerrol Banana., MD [facility], [place]   In 1 week Vickie Epley, MD [facility], [place]   In [place] month Jerrol Banana., MD [facility], [place]   In 2 months Ralene Bathe, MD [place]

## 2021-10-24 ENCOUNTER — Ambulatory Visit: Payer: Medicare Other | Admitting: Family

## 2021-10-26 ENCOUNTER — Encounter: Payer: Self-pay | Admitting: Family Medicine

## 2021-10-26 ENCOUNTER — Ambulatory Visit (INDEPENDENT_AMBULATORY_CARE_PROVIDER_SITE_OTHER): Payer: Medicare Other | Admitting: Family Medicine

## 2021-10-26 VITALS — BP 122/63 | HR 76 | Resp 16 | Wt 171.0 lb

## 2021-10-26 DIAGNOSIS — B0229 Other postherpetic nervous system involvement: Secondary | ICD-10-CM | POA: Diagnosis not present

## 2021-10-26 DIAGNOSIS — B029 Zoster without complications: Secondary | ICD-10-CM | POA: Diagnosis not present

## 2021-10-26 MED ORDER — GABAPENTIN 400 MG PO CAPS: ORAL_CAPSULE | ORAL | 1 refills | Status: DC

## 2021-10-26 NOTE — Progress Notes (Signed)
Established patient visit  I,[name],acting as a scribe for Kathryn Durie, MD.,have documented all relevant documentation on the behalf of Kathryn Durie, MD,as directed by  Kathryn Durie, MD while in the presence of Kathryn Durie, MD.   Patient: Kathryn [name]   DOB: 08-Jan-1934   88 y.o. Female  MRN: [id number] Visit Date: 10/26/2021  Today's healthcare provider: [name], MD   Chief Complaint  Patient presents with   Follow-up   Herpes Zoster   Subjective    HPI  Still with some Postherpetic neuralgia but she is tolerating gabapentin and it is slightly improved  Follow up for Shingles:  The patient was last seen for this 2 weeks ago. Changes made at last visit include; Increase gabapentin to 100 mg, 3 twice daily.  ----------------------------------------------------------------------------------------- Patient states she in a lot pain pain from Shingles. Medication is not helping with pain.   Patient's feet  has also been swelling to double the size.   Medications: Outpatient Medications Prior to Visit  Medication Sig   acetaminophen (TYLENOL) 500 MG tablet Take 1,000 mg by mouth every 6 (six) hours as needed. Pain    albuterol (PROVENTIL) (2.5 MG/3ML) 0.083% nebulizer solution USE 1 VIAL VIA NEBULIZER EVERY 6 HOURS AS NEEDED FOR WHEEZING OR SHORTNESS OF BREATH   amiodarone (PACERONE) 200 MG tablet Take 1 tablet (200 mg total) by mouth daily.   aspirin 81 MG EC tablet Take 81 mg by mouth daily. Swallow whole.   atorvastatin (LIPITOR) 40 MG tablet TAKE 1 TABLET(40 MG) BY MOUTH DAILY   carvedilol (COREG) 6.25 MG tablet TAKE 1 TABLET(6.25 MG) BY MOUTH TWICE DAILY WITH A MEAL   FARXIGA 10 MG TABS tablet TAKE 1 TABLET(10 MG) BY MOUTH DAILY BEFORE BREAKFAST   fluorouracil (EFUDEX) 5 % cream Apply topically 2 (two) times daily. Thin coat bid to bilateral temples for 7 days   glucose blood (CONTOUR NEXT TEST) test strip Check sugar once  daily  DX E11.9   HYDROcodone-acetaminophen (NORCO) 5-325 MG tablet Take 1 tablet by mouth every 6 (six) hours as needed for moderate pain or severe pain.   hydrOXYzine (ATARAX) 10 MG tablet TAKE 1 TO 2 TABLETS(10 TO 20 MG) BY MOUTH EVERY 6 HOURS AS NEEDED   levothyroxine (SYNTHROID) 100 MCG tablet TAKE 1 TABLET(100 MCG) BY MOUTH DAILY BEFORE BREAKFAST   losartan (COZAAR) 25 MG tablet Take 1 tablet (25 mg total) by mouth every evening.   metFORMIN (GLUCOPHAGE) 1000 MG tablet TAKE 1 TABLET(1000 MG) BY MOUTH DAILY WITH SUPPER   mupirocin ointment (BACTROBAN) 2 % Apply 1 application. topically daily. Qd to excision site   omeprazole (PRILOSEC) 20 MG capsule TAKE 1 CAPSULE(20 MG) BY MOUTH DAILY   SPIRIVA HANDIHALER 18 MCG inhalation capsule PLACE 1 CAPSULE INTO INHALER AND INHALE EVERY DAY AS NEEDED   Torsemide 40 MG TABS Take 40 mg by mouth daily. May take extra 20 mg (1/2 tab) after lunch as needed for swelling   [DISCONTINUED] gabapentin (NEURONTIN) 100 MG capsule Take 3 capsules (300 mg total) by mouth 3 (three) times daily.   valACYclovir (VALTREX) 1000 MG tablet Take 1 tablet (1,000 mg total) by mouth 2 (two) times daily. (Patient not taking: Reported on 10/26/2021)   No facility-administered medications prior to visit.    Review of Systems  Constitutional:  Negative for appetite change, chills, fatigue and fever.  Respiratory:  Negative for chest tightness and shortness of breath.  Cardiovascular:  Negative for chest pain and palpitations.  Gastrointestinal:  Negative for abdominal pain, nausea and vomiting.  Neurological:  Negative for dizziness and weakness.    Last hemoglobin A1c Lab Results  Component Value Date   HGBA1C 7.8 (A) 06/12/2021       Objective    BP 122/63 (BP Location: Right Arm, Patient Position: Sitting, Cuff Size: Normal)   Pulse 76   Resp 16   Wt 171 lb (77.6 kg)   SpO2 96%   BMI 29.35 kg/m  BP Readings from Last 3 Encounters:  10/26/21 122/63  10/12/21  120/70  10/04/21 132/66   Wt Readings from Last 3 Encounters:  10/26/21 171 lb (77.6 kg)  09/02/21 175 lb (79.4 kg)  08/30/21 176 lb (79.8 kg)      Physical Exam Vitals reviewed.  Constitutional:      General: She is not in acute distress.    Appearance: She is well-developed.  HENT:     Head: Normocephalic and atraumatic.     Right Ear: Hearing normal.     Left Ear: Hearing normal.     Nose: Nose normal.  Eyes:     General: Lids are normal. No scleral icterus.       Right eye: No discharge.        Left eye: No discharge.     Conjunctiva/sclera: Conjunctivae normal.  Cardiovascular:     Rate and Rhythm: Normal rate and regular rhythm.     Heart sounds: Normal heart sounds.  Pulmonary:     Effort: Pulmonary effort is normal. No respiratory distress.  Skin:    Findings: No lesion or rash.  Neurological:     General: No focal deficit present.     Mental Status: She is alert and oriented to person, place, and time.  Psychiatric:        Mood and Affect: Mood normal.        Speech: Speech normal.        Behavior: Behavior normal.        Thought Content: Thought content normal.        Judgment: Judgment normal.       No results found for any visits on 10/26/21.  Assessment & Plan     1. Postherpetic neuralgia Crease gabapentin from 300 mg to 400 mg. Follow-up in a month - gabapentin (NEURONTIN) 400 MG capsule; Take One Tablet In The Morning And Two Tablets In The Evening.  Dispense: 120 capsule; Refill: 1  2. Herpes zoster without complication  - gabapentin (NEURONTIN) 400 MG capsule; Take One Tablet In The Morning And Two Tablets In The Evening.  Dispense: 120 capsule; Refill: 1   Return in about 3 weeks (around 11/16/2021).      I, Kathryn Durie, MD, have reviewed all documentation for this visit. The documentation on 10/30/21 for the exam, diagnosis, procedures, and orders are all accurate and complete.    Leonidas Boateng Cranford Mon, MD  [facility] [phone] (phone) [phone] (fax)  [place]

## 2021-10-30 ENCOUNTER — Other Ambulatory Visit: Payer: Self-pay | Admitting: Cardiovascular Disease

## 2021-11-01 ENCOUNTER — Ambulatory Visit: Payer: Medicare Other | Admitting: Cardiology

## 2021-11-01 ENCOUNTER — Encounter: Payer: Self-pay | Admitting: Cardiology

## 2021-11-01 NOTE — Progress Notes (Deleted)
Electrophysiology Office Follow up Visit Note:    Date:  11/01/2021   ID:  Kathryn [name], DOB 1934/02/13, MRN [id number]  PCP:  Jerrol Banana., MD  [facility] Cardiologist:  Ida Rogue, MD  [facility] Electrophysiologist:  Vickie Epley, MD    Interval History:    Kathryn [name] is a 88 y.o. female who presents for a follow up visit. They were last seen in clinic ***. Since their last appointment, ***       Past Medical History:  Diagnosis Date   Actinic keratosis    Aortic atherosclerosis ([place]) 05/25/2020   Arthritis    spinal stenosis   Cardiomyopathy - presumed to be nonischemic    a. 03/2020 Echo: EF 40-45%, gr2 DD. Nl RV fxn. Mildly dil LA. Mild MR/ao sclerosis; b. 03/2020 MV: EF 30-44%, no ischemia. Cor Ca2+ in pLAD.   CHF (congestive heart failure) (HCC)    COPD (chronic obstructive pulmonary disease) (HCC)    Coronary artery calcification seen on CT scan    a. 03/2020 MV: CT attenuation images show Ao and mod pLAD Ca2+.   Diabetes mellitus without complication ([place])    Gastric ulcer 06/2010   treated with Prolisec- states no problems now   Hyperlipidemia    Hypertension    PCP Dr Miguel Aschoff   [place]   Hypothyroidism    Left bundle branch block (LBBB)    Neuromuscular disorder (HCC)    slight numbness right toes- comes and goes   Peripheral vascular disease (HCC)    varicose veins left leg   Pneumonia    PSVT (paroxysmal supraventricular tachycardia) ([place])    a. 03/2020 Zio: Avg HR 87 (66-211).  4 beats NSVT.  43 PSVT episodes, longest 10h 75m@ avg of 153 bpm (max 211).  Seen by EP-->amio started (pt wished to avoid EPS/RFCA).   Seasonal allergies    Shortness of breath    Skin cancer    multiple from face   Squamous cell carcinoma of skin 03/23/2013   L dorsal hand   Squamous cell carcinoma of skin 02/04/2014   R forearm/in situ, L pretibial   Squamous cell carcinoma of skin 09/24/2018   R thumb   Squamous cell carcinoma  of skin 09/08/2019   L forearm - ED&C   Squamous cell carcinoma of skin 09/08/2019   R dosal hand - ED&C   Squamous cell carcinoma of skin 06/20/2021   R cheek preauricular, excised    Past Surgical History:  Procedure Laterality Date   BACK SURGERY     4/12  Dr AShellia Carwin for lumbar stenosis   BREAST CYST ASPIRATION Left 1965   negative   [place]  lumpectomy with biopsy   left   CARPAL TUNNEL RELEASE     right   COLONOSCOPY     COLONOSCOPY WITH PROPOFOL N/A 06/30/2015   Procedure: COLONOSCOPY WITH PROPOFOL;  Surgeon: PHulen Luster MD;  [facility];  Service: Gastroenterology;  Laterality: N/A;   COLONOSCOPY WITH PROPOFOL N/A 01/22/2017   Procedure: COLONOSCOPY WITH PROPOFOL;  Surgeon: SLollie Sails MD;  [facility];  Service: Endoscopy;  Laterality: N/A;   COLONOSCOPY WITH PROPOFOL N/A 01/28/2019   Procedure: COLONOSCOPY WITH PROPOFOL;  Surgeon: [place], TBenay Pike MD;  Location: [facility] ENDOSCOPY;  Service: Gastroenterology;  Laterality: N/A;   ESOPHAGOGASTRODUODENOSCOPY     ESOPHAGOGASTRODUODENOSCOPY (EGD) WITH PROPOFOL N/A 01/28/2019   Procedure: ESOPHAGOGASTRODUODENOSCOPY (EGD) WITH PROPOFOL;  Surgeon: TSorento  Benay Pike, MD;  Location: [facility] ENDOSCOPY;  Service: Gastroenterology;  Laterality: N/A;   EYE SURGERY     cataract extraction with IOL bilaterally   JOINT REPLACEMENT     LUMBAR LAMINECTOMY/DECOMPRESSION MICRODISCECTOMY  05/24/2011   Procedure: LUMBAR LAMINECTOMY/DECOMPRESSION MICRODISCECTOMY 2 LEVELS;  Surgeon: Magnus Sinning, MD;  Location: [facility];  Service: Orthopedics;  Laterality: N/A;  L2-L3, L3-L4 (x-ray)   RIGHT OOPHORECTOMY     due to STAPH INFECTION    Current Medications: No outpatient medications have been marked as taking for the 11/01/21 encounter (Appointment) with Vickie Epley, MD.     Allergies:   Oxycodone and Prednisone   Social History   Socioeconomic History   Marital status: Widowed    Spouse  name: Not on file   Number of children: Not on file   Years of education: Not on file   Highest education level: Not on file  Occupational History   Not on file  Tobacco Use   Smoking status: Former    Packs/day: 0.50    Years: 50.00    Total pack years: 25.00    Types: Cigarettes    Quit date: 05/16/2004    Years since quitting: 17.4   Smokeless tobacco: Never  Vaping Use   Vaping Use: Never used  Substance and Sexual Activity   Alcohol use: Yes    Comment: socially-1 glass of wine or [place] icea tea once a month   Drug use: No   Sexual activity: Yes    Birth control/protection: None  Other Topics Concern   Not on file  Social History Narrative   Not on file   Social Determinants of Health   Financial Resource Strain: Not on file  Food Insecurity: No Food Insecurity (10/18/2020)   Hunger Vital Sign    Worried About Running Out of Food in the Last Year: Never true    [place] in the Last Year: Never true  Transportation Needs: No Transportation Needs (10/18/2020)   PRAPARE - Hydrologist (Medical): No    Lack of Transportation (Non-Medical): No  Physical Activity: Not on file  Stress: Not on file  Social Connections: Not on file     Family History: The patient's family history includes Arthritis in her son; Breast cancer in her maternal aunt; Dementia in her mother; Heart disease in her father; Hypertension in her sister; Lung cancer in her father.  ROS:   Please see the history of present illness.    All other systems reviewed and are negative.  EKGs/Labs/Other Studies Reviewed:    The following studies were reviewed today: ***  EKG:  The ekg ordered today demonstrates ***  Recent Labs: 12/21/2020: ALT 13 01/11/2021: Hemoglobin 11.2; Platelets 341 01/13/2021: TSH 5.930 01/20/2021: BNP 170.3 08/28/2021: BUN 23; Creatinine, Ser 1.12; Potassium 4.8; Sodium 140  Recent Lipid Panel    Component Value Date/Time   CHOL 177  10/03/2020 1117   TRIG 239 (H) 10/03/2020 1117   HDL 55 10/03/2020 1117   CHOLHDL 3.2 10/03/2020 1117   CHOLHDL 5.9 04/13/2020 0909   VLDL 48 (H) 04/13/2020 0909   LDLCALC 83 10/03/2020 1117    Physical Exam:    VS:  There were no vitals taken for this visit.    Wt Readings from Last 3 Encounters:  10/26/21 171 lb (77.6 kg)  09/02/21 175 lb (79.4 kg)  08/30/21 176 lb (79.8 kg)     GEN: *** Well nourished,  well developed in no acute distress HEENT: Normal NECK: No JVD; No carotid bruits LYMPHATICS: No lymphadenopathy CARDIAC: ***RRR, no murmurs, rubs, gallops RESPIRATORY:  Clear to auscultation without rales, wheezing or rhonchi  ABDOMEN: Soft, non-tender, non-distended MUSCULOSKELETAL:  No edema; No deformity  SKIN: Warm and dry NEUROLOGIC:  Alert and oriented x 3 PSYCHIATRIC:  Normal affect        ASSESSMENT:    No diagnosis found. PLAN:    In order of problems listed above:           Total time spent with patient today *** minutes. This includes reviewing records, evaluating the patient and coordinating care.   Medication Adjustments/Labs and Tests Ordered: Current medicines are reviewed at length with the patient today.  Concerns regarding medicines are outlined above.  No orders of the defined types were placed in this encounter.  No orders of the defined types were placed in this encounter.    Signed, Lars Mage, MD, [facility], [facility] 11/01/2021 8:54 AM    Electrophysiology [facility]

## 2021-11-16 ENCOUNTER — Encounter: Payer: Self-pay | Admitting: Family Medicine

## 2021-11-16 ENCOUNTER — Ambulatory Visit (INDEPENDENT_AMBULATORY_CARE_PROVIDER_SITE_OTHER): Payer: Medicare Other | Admitting: Family Medicine

## 2021-11-16 VITALS — BP 118/67 | HR 77 | Resp 16

## 2021-11-16 DIAGNOSIS — B0229 Other postherpetic nervous system involvement: Secondary | ICD-10-CM

## 2021-11-16 DIAGNOSIS — B029 Zoster without complications: Secondary | ICD-10-CM

## 2021-11-16 MED ORDER — PREGABALIN 25 MG PO CAPS: 25.0000 mg | ORAL_CAPSULE | Freq: Two times a day (BID) | ORAL | 5 refills | Status: DC

## 2021-11-16 NOTE — Progress Notes (Unsigned)
Established patient visit  I,[name],acting as a scribe for [name], MD.,have documented all relevant documentation on the behalf of [name], MD,as directed by  [name], MD while in the presence of [name], MD.   Patient: Kathryn [name]   DOB: Nov 07, 1933   88 y.o. Female  MRN: [id number] Visit Date: 11/16/2021  Today's healthcare provider: [name], MD   Chief Complaint  Patient presents with   Follow-up   Subjective    HPI  She states the pain is no better for the postherpetic neuralgia on the Neurontin and she gets woozy/dizzy with it.. She states the pain is in affecting her quality of life.  Follow up for Postherpetic neuralgia:  The patient was last seen for this 3 weeks ago. Changes made at last visit include; increased gabapentin from 300 mg to 400 mg.  She reports good compliance with treatment. She feels that condition is Worse. She is not having side effects. none  -----------------------------------------------------------------------------------------  Patient states shingle pain is worse. Patient states pain is severe. Medications: Outpatient Medications Prior to Visit  Medication Sig   acetaminophen (TYLENOL) 500 MG tablet Take 1,000 mg by mouth every 6 (six) hours as needed. Pain    albuterol (PROVENTIL) (2.5 MG/3ML) 0.083% nebulizer solution USE 1 VIAL VIA NEBULIZER EVERY 6 HOURS AS NEEDED FOR WHEEZING OR SHORTNESS OF BREATH   amiodarone (PACERONE) 200 MG tablet Take 1 tablet (200 mg total) by mouth daily.   aspirin 81 MG EC tablet Take 81 mg by mouth daily. Swallow whole.   atorvastatin (LIPITOR) 40 MG tablet TAKE 1 TABLET(40 MG) BY MOUTH DAILY   carvedilol (COREG) 6.25 MG tablet TAKE 1 TABLET(6.25 MG) BY MOUTH TWICE DAILY WITH A MEAL   FARXIGA 10 MG TABS tablet TAKE 1 TABLET(10 MG) BY MOUTH DAILY BEFORE BREAKFAST   gabapentin (NEURONTIN) 400 MG capsule Take One Tablet In The Morning And Two  Tablets In The Evening.   glucose blood (CONTOUR NEXT TEST) test strip Check sugar once daily  DX E11.9   hydrOXYzine (ATARAX) 10 MG tablet TAKE 1 TO 2 TABLETS(10 TO 20 MG) BY MOUTH EVERY 6 HOURS AS NEEDED   levothyroxine (SYNTHROID) 100 MCG tablet TAKE 1 TABLET(100 MCG) BY MOUTH DAILY BEFORE BREAKFAST   losartan (COZAAR) 25 MG tablet Take 1 tablet (25 mg total) by mouth every evening.   metFORMIN (GLUCOPHAGE) 1000 MG tablet TAKE 1 TABLET(1000 MG) BY MOUTH DAILY WITH SUPPER   omeprazole (PRILOSEC) 20 MG capsule TAKE 1 CAPSULE(20 MG) BY MOUTH DAILY   SPIRIVA HANDIHALER 18 MCG inhalation capsule PLACE 1 CAPSULE INTO INHALER AND INHALE EVERY DAY AS NEEDED   Torsemide 40 MG TABS Take 40 mg by mouth daily. May take extra 20 mg (1/2 tab) after lunch as needed for swelling   HYDROcodone-acetaminophen (NORCO) 5-325 MG tablet Take 1 tablet by mouth every 6 (six) hours as needed for moderate pain or severe pain. (Patient not taking: Reported on 11/16/2021)   [DISCONTINUED] fluorouracil (EFUDEX) 5 % cream Apply topically 2 (two) times daily. Thin coat bid to bilateral temples for 7 days (Patient not taking: Reported on 11/16/2021)   [DISCONTINUED] mupirocin ointment (BACTROBAN) 2 % Apply 1 application. topically daily. Qd to excision site (Patient not taking: Reported on 11/16/2021)   [DISCONTINUED] valACYclovir (VALTREX) 1000 MG tablet Take 1 tablet (1,000 mg total) by mouth 2 (two) times daily. (Patient not taking: Reported on 10/26/2021)   No facility-administered  medications prior to visit.    Review of Systems  Constitutional:  Negative for appetite change, chills, fatigue and fever.  Respiratory:  Negative for chest tightness and shortness of breath.   Cardiovascular:  Negative for chest pain and palpitations.  Gastrointestinal:  Negative for abdominal pain, nausea and vomiting.  Neurological:  Negative for dizziness and weakness.    Last hemoglobin A1c Lab Results  Component Value Date   HGBA1C  7.8 (A) 06/12/2021       Objective    BP 118/67 (BP Location: Left Arm, Patient Position: Sitting, Cuff Size: Large)   Pulse 77   Resp 16   SpO2 98%  BP Readings from Last 3 Encounters:  11/16/21 118/67  10/26/21 122/63  10/12/21 120/70   Wt Readings from Last 3 Encounters:  10/26/21 171 lb (77.6 kg)  09/02/21 175 lb (79.4 kg)  08/30/21 176 lb (79.8 kg)      Physical Exam    No results found for any visits on 11/16/21.  Assessment & Plan     1. Postherpetic neuralgia Stop gabapentin and try pregabalin.f/u 2-3 weeks. - pregabalin (LYRICA) 25 MG capsule; Take 1 capsule (25 mg total) by mouth 2 (two) times daily. After 1 week can go to 2 BID  Dispense: 120 capsule; Refill: 5  2. Herpes zoster without complication  - pregabalin (LYRICA) 25 MG capsule; Take 1 capsule (25 mg total) by mouth 2 (two) times daily. After 1 week can go to 2 BID  Dispense: 120 capsule; Refill: 5   No follow-ups on file.      I, [name], MD, have reviewed all documentation for this visit. The documentation on 11/19/21 for the exam, diagnosis, procedures, and orders are all accurate and complete.    Noble Cicalese Cranford Mon, MD  [facility] [phone] (phone) [phone] (fax)  [place]

## 2021-11-16 NOTE — Patient Instructions (Signed)
Stop Gabapentin.

## 2021-11-23 ENCOUNTER — Ambulatory Visit: Payer: Self-pay

## 2021-11-23 NOTE — Telephone Encounter (Signed)
     Chief Complaint: Continued pain from shingles. Pain is between shoulders, neck and front of neck. Can increase her Lyrica this coming Sunday. Asking if there is something else she can have for pain. Symptoms: Above Frequency: July Pertinent Negatives: Patient denies  Disposition: '[]'$ ED /'[]'$ Urgent Care (no appt availability in office) / '[]'$ Appointment(In office/virtual)/ '[]'$  [place] [facility]/ '[]'$ Home Care/ '[]'$ Refused Recommended Disposition /'[]'$  Mobile Bus/ '[x]'$  Follow-up with PCP Additional Notes: Please advise pt.  Answer Assessment - Initial Assessment Questions 1. NAME of MEDICINE: "What medicine(s) are you calling about?"     Lyrica - is not helping 2. QUESTION: "What is your question?" (e.g., double dose of medicine, side effect)     Is there something else she can take. 3. PRESCRIBER: "Who prescribed the medicine?" Reason: if prescribed by specialist, call should be referred to that group.     Dr. Rosanna Randy 4. SYMPTOMS: "Do you have any symptoms?" If Yes, ask: "What symptoms are you having?"  "How bad are the symptoms (e.g., mild, moderate, severe)     Pain 5. PREGNANCY:  "Is there any chance that you are pregnant?" "When was your last menstrual period?"     no  Protocols used: Medication Question Call-A-AH

## 2021-12-12 ENCOUNTER — Encounter: Payer: Self-pay | Admitting: Family Medicine

## 2021-12-12 ENCOUNTER — Ambulatory Visit (INDEPENDENT_AMBULATORY_CARE_PROVIDER_SITE_OTHER): Payer: Medicare Other | Admitting: Family Medicine

## 2021-12-12 VITALS — BP 128/80 | HR 78 | Resp 16 | Wt 170.0 lb

## 2021-12-12 DIAGNOSIS — B0229 Other postherpetic nervous system involvement: Secondary | ICD-10-CM

## 2021-12-12 DIAGNOSIS — Z23 Encounter for immunization: Secondary | ICD-10-CM | POA: Diagnosis not present

## 2021-12-12 DIAGNOSIS — I10 Essential (primary) hypertension: Secondary | ICD-10-CM

## 2021-12-12 DIAGNOSIS — E039 Hypothyroidism, unspecified: Secondary | ICD-10-CM

## 2021-12-12 DIAGNOSIS — I509 Heart failure, unspecified: Secondary | ICD-10-CM | POA: Diagnosis not present

## 2021-12-12 DIAGNOSIS — B029 Zoster without complications: Secondary | ICD-10-CM

## 2021-12-12 DIAGNOSIS — J441 Chronic obstructive pulmonary disease with (acute) exacerbation: Secondary | ICD-10-CM | POA: Diagnosis not present

## 2021-12-12 DIAGNOSIS — E7800 Pure hypercholesterolemia, unspecified: Secondary | ICD-10-CM | POA: Diagnosis not present

## 2021-12-12 DIAGNOSIS — E1140 Type 2 diabetes mellitus with diabetic neuropathy, unspecified: Secondary | ICD-10-CM | POA: Diagnosis not present

## 2021-12-12 MED ORDER — PREGABALIN 50 MG PO CAPS: 100.0000 mg | ORAL_CAPSULE | Freq: Two times a day (BID) | ORAL | 5 refills | Status: DC

## 2021-12-12 MED ORDER — PREGABALIN 75 MG PO CAPS: 75.0000 mg | ORAL_CAPSULE | Freq: Two times a day (BID) | ORAL | 5 refills | Status: DC

## 2021-12-12 NOTE — Progress Notes (Signed)
Established patient visit  I,Kathryn [name],acting as a scribe for [name], MD.,have documented all relevant documentation on the behalf of [name], MD,as directed by  [name], MD while in the presence of [name], MD.   Patient: Kathryn [name]   DOB: 01-24-1934   88 y.o. Female  MRN: [id number] Visit Date: 12/12/2021  Today's healthcare provider: [name], MD   Chief Complaint  Patient presents with   Follow-up   Subjective    HPI  Patient comes in today for follow-up of herpetic neuralgia.  He is still having pain but is in a smaller area in her upper chest over her clavicular area right.  She is tolerating this fairly well but after discussing it with her when I try her on Lyrica instead of gabapentin as I think we have reached maximum dose of the [place].  Follow up for Postherpetic neuralgia:  The patient was last seen for this 1 months ago. Changes made at last visit include; Stop gabapentin and try pregabalin.f/u 2-3 weeks.Marland Kitchen -----------------------------------------------------------------------------------------   Medications: Outpatient Medications Prior to Visit  Medication Sig   acetaminophen (TYLENOL) 500 MG tablet Take 1,000 mg by mouth every 6 (six) hours as needed. Pain    albuterol (PROVENTIL) (2.5 MG/3ML) 0.083% nebulizer solution USE 1 VIAL VIA NEBULIZER EVERY 6 HOURS AS NEEDED FOR WHEEZING OR SHORTNESS OF BREATH   amiodarone (PACERONE) 200 MG tablet Take 1 tablet (200 mg total) by mouth daily.   aspirin 81 MG EC tablet Take 81 mg by mouth daily. Swallow whole.   atorvastatin (LIPITOR) 40 MG tablet TAKE 1 TABLET(40 MG) BY MOUTH DAILY   carvedilol (COREG) 6.25 MG tablet TAKE 1 TABLET(6.25 MG) BY MOUTH TWICE DAILY WITH A MEAL   FARXIGA 10 MG TABS tablet TAKE 1 TABLET(10 MG) BY MOUTH DAILY BEFORE BREAKFAST   gabapentin (NEURONTIN) 400 MG capsule Take One Tablet In The Morning And Two Tablets In The Evening.    glucose blood (CONTOUR NEXT TEST) test strip Check sugar once daily  DX E11.9   hydrOXYzine (ATARAX) 10 MG tablet TAKE 1 TO 2 TABLETS(10 TO 20 MG) BY MOUTH EVERY 6 HOURS AS NEEDED   levothyroxine (SYNTHROID) 100 MCG tablet TAKE 1 TABLET(100 MCG) BY MOUTH DAILY BEFORE BREAKFAST   losartan (COZAAR) 25 MG tablet Take 1 tablet (25 mg total) by mouth every evening.   metFORMIN (GLUCOPHAGE) 1000 MG tablet TAKE 1 TABLET(1000 MG) BY MOUTH DAILY WITH SUPPER   omeprazole (PRILOSEC) 20 MG capsule TAKE 1 CAPSULE(20 MG) BY MOUTH DAILY   pregabalin (LYRICA) 25 MG capsule Take 1 capsule (25 mg total) by mouth 2 (two) times daily. After 1 week can go to 2 BID   SPIRIVA HANDIHALER 18 MCG inhalation capsule PLACE 1 CAPSULE INTO INHALER AND INHALE EVERY DAY AS NEEDED   Torsemide 40 MG TABS Take 40 mg by mouth daily. May take extra 20 mg (1/2 tab) after lunch as needed for swelling   HYDROcodone-acetaminophen (NORCO) 5-325 MG tablet Take 1 tablet by mouth every 6 (six) hours as needed for moderate pain or severe pain. (Patient not taking: Reported on 11/16/2021)   No facility-administered medications prior to visit.    Review of Systems  Constitutional:  Negative for appetite change, chills, fatigue and fever.  Respiratory:  Negative for chest tightness and shortness of breath.   Cardiovascular:  Negative for chest pain and palpitations.  Gastrointestinal:  Negative for abdominal pain, nausea and  vomiting.  Neurological:  Negative for dizziness and weakness.    Last hemoglobin A1c Lab Results  Component Value Date   HGBA1C 6.9 (H) 12/12/2021       Objective    BP 128/80 (BP Location: Left Arm, Patient Position: Sitting, Cuff Size: Normal)   Pulse 78   Resp 16   Wt 170 lb (77.1 kg)   SpO2 97%   BMI 29.18 kg/m  BP Readings from Last 3 Encounters:  12/12/21 128/80  11/16/21 118/67  10/26/21 122/63   Wt Readings from Last 3 Encounters:  12/12/21 170 lb (77.1 kg)  10/26/21 171 lb (77.6 kg)   09/02/21 175 lb (79.4 kg)      Physical Exam Vitals reviewed.  Constitutional:      General: She is not in acute distress.    Appearance: She is well-developed.  HENT:     Head: Normocephalic and atraumatic.     Right Ear: Hearing normal.     Left Ear: Hearing normal.     Nose: Nose normal.  Eyes:     General: Lids are normal. No scleral icterus.       Right eye: No discharge.        Left eye: No discharge.     Conjunctiva/sclera: Conjunctivae normal.  Cardiovascular:     Rate and Rhythm: Normal rate and regular rhythm.     Heart sounds: Normal heart sounds.  Pulmonary:     Effort: Pulmonary effort is normal. No respiratory distress.  Skin:    Findings: No lesion or rash.  Neurological:     General: No focal deficit present.     Mental Status: She is alert and oriented to person, place, and time.  Psychiatric:        Mood and Affect: Mood normal.        Speech: Speech normal.        Behavior: Behavior normal.        Thought Content: Thought content normal.        Judgment: Judgment normal.       No results found for any visits on 12/12/21.  Assessment & Plan     1. Need for immunization against influenza  - Flu Vaccine QUAD High Dose(Fluad) - Hemoglobin A1c - CBC w/Diff/Platelet - Comprehensive Metabolic Panel (CMET) - TSH - Lipid panel  2. Type 2 diabetes mellitus with diabetic neuropathy, without long-term current use of insulin (HCC) : A1c less than 24 in this 88 year old - Hemoglobin A1c - CBC w/Diff/Platelet - Comprehensive Metabolic Panel (CMET) - TSH - Lipid panel  3. Essential hypertension Good control - Hemoglobin A1c - CBC w/Diff/Platelet - Comprehensive Metabolic Panel (CMET) - TSH - Lipid panel  4. Hypothyroidism, unspecified type Treat for euthyroid TSH - Hemoglobin A1c - CBC w/Diff/Platelet - Comprehensive Metabolic Panel (CMET) - TSH - Lipid panel  5. Hypercholesteremia On atorvastatin 40 - Hemoglobin A1c - CBC  w/Diff/Platelet - Comprehensive Metabolic Panel (CMET) - TSH - Lipid pa 6.COPD(HCC) stable - Hemoglobin A1c - CBC w/Diff/Platelet - Comprehensive Metabolic Panel (CMET) - TSH - Lipid panel  7. Congestive heart failure, unspecified HF chronicity, unspecified heart failure type ([place])   8. Postherpetic neuralgia Gabapentin and start Lyrica.  May need to titrate the dose up but is improving from the postherpetic neuralgia - pregabalin (LYRICA) 75 MG capsule; Take 1 capsule (75 mg total) by mouth 2 (two) times daily.    No follow-ups on file.      I, [name],  MD, have reviewed all documentation for this visit. The documentation on 12/16/21 for the exam, diagnosis, procedures, and orders are all accurate and complete.    Megyn Leng Cranford Mon, MD  [facility] [phone] (phone) [phone] (fax)  [place]

## 2021-12-12 NOTE — Patient Instructions (Signed)
STOP GABAPENTIN!!

## 2021-12-13 LAB — CBC WITH DIFFERENTIAL/PLATELET
Basophils Absolute: 0 10*3/uL (ref 0.0–0.2)
Basos: 1 %
EOS (ABSOLUTE): 0.2 10*3/uL (ref 0.0–0.4)
Eos: 2 %
Hematocrit: 36.3 % (ref 34.0–46.6)
Hemoglobin: 10.5 g/dL — ABNORMAL LOW (ref 11.1–15.9)
Immature Grans (Abs): 0 10*3/uL (ref 0.0–0.1)
Immature Granulocytes: 1 %
Lymphocytes Absolute: 1.4 10*3/uL (ref 0.7–3.1)
Lymphs: 19 %
MCH: 23.3 pg — ABNORMAL LOW (ref 26.6–33.0)
MCHC: 28.9 g/dL — ABNORMAL LOW (ref 31.5–35.7)
MCV: 81 fL (ref 79–97)
Monocytes Absolute: 0.7 10*3/uL (ref 0.1–0.9)
Monocytes: 9 %
Neutrophils Absolute: 5 10*3/uL (ref 1.4–7.0)
Neutrophils: 68 %
Platelets: 310 10*3/uL (ref 150–450)
RBC: 4.5 x10E6/uL (ref 3.77–5.28)
RDW: 15.8 % — ABNORMAL HIGH (ref 11.7–15.4)
WBC: 7.2 10*3/uL (ref 3.4–10.8)

## 2021-12-13 LAB — LIPID PANEL
Chol/HDL Ratio: 2.1 ratio (ref 0.0–4.4)
Cholesterol, Total: 139 mg/dL (ref 100–199)
HDL: 66 mg/dL (ref >39)
LDL Chol Calc (NIH): 41 mg/dL (ref 0–99)
Triglycerides: 204 mg/dL — ABNORMAL HIGH (ref 0–149)
VLDL Cholesterol Cal: 32 mg/dL (ref 5–40)

## 2021-12-13 LAB — HEMOGLOBIN A1C
Est. average glucose Bld gHb Est-mCnc: 151 mg/dL
Hgb A1c MFr Bld: 6.9 % — ABNORMAL HIGH (ref 4.8–5.6)

## 2021-12-13 LAB — COMPREHENSIVE METABOLIC PANEL
ALT: 11 IU/L (ref 0–32)
AST: 14 IU/L (ref 0–40)
Albumin/Globulin Ratio: 1.5 (ref 1.2–2.2)
Albumin: 4.1 g/dL (ref 3.7–4.7)
Alkaline Phosphatase: 83 IU/L (ref 44–121)
BUN/Creatinine Ratio: 20 (ref 12–28)
BUN: 20 mg/dL (ref 8–27)
Bilirubin Total: 0.3 mg/dL (ref 0.0–1.2)
CO2: 23 mmol/L (ref 20–29)
Calcium: 9 mg/dL (ref 8.7–10.3)
Chloride: 104 mmol/L (ref 96–106)
Creatinine, Ser: 1.01 mg/dL — ABNORMAL HIGH (ref 0.57–1.00)
Globulin, Total: 2.7 g/dL (ref 1.5–4.5)
Glucose: 146 mg/dL — ABNORMAL HIGH (ref 70–99)
Potassium: 4.4 mmol/L (ref 3.5–5.2)
Sodium: 143 mmol/L (ref 134–144)
Total Protein: 6.8 g/dL (ref 6.0–8.5)
eGFR: 54 mL/min/{1.73_m2} — ABNORMAL LOW (ref >59)

## 2021-12-13 LAB — TSH: TSH: 3.62 u[IU]/mL (ref 0.450–4.500)

## 2021-12-13 NOTE — Chronic Care Management (AMB) (Signed)
     Kathryn [name] 16-Feb-1934 [id number]  Documentation encounter created to complete case transition. Patient expressed concerns regarding pain d/t shingles. Reports gabapentin was initially prescribed. Reports the medication was not effective and was changed to Lyrica. Reports pain is still not controlled. Conference call with clinic triage nurse conducted today. Concerns relayed to PCP. The clinic team will contact [name] regarding provider's recommendations.   A member of the care management team will follow up tomorrow.  [facility] [phone]

## 2021-12-14 ENCOUNTER — Ambulatory Visit: Payer: Medicare Other | Admitting: Family Medicine

## 2021-12-15 DIAGNOSIS — M19011 Primary osteoarthritis, right shoulder: Secondary | ICD-10-CM | POA: Diagnosis not present

## 2021-12-15 DIAGNOSIS — M19012 Primary osteoarthritis, left shoulder: Secondary | ICD-10-CM | POA: Diagnosis not present

## 2021-12-19 ENCOUNTER — Other Ambulatory Visit (HOSPITAL_COMMUNITY): Payer: Self-pay

## 2021-12-19 NOTE — Progress Notes (Signed)
Nyeemah states she is doing ok except the shingles.  She still battling the one on her neck.  She states feels like a knife sticking 24 hrs a day.  She has been to her doctors several times.  The other spot has went away.  She states has some swelling in the evening in her feet and goes down over night or when she props them up.  Explained her heart just can not push the fluid up when her legs are down.  She likes to sit outside on her porch to get fresh air to read a book during the day.  She states she does her best, little bit at a time.  She is staying home mostly.  She does not want to spread the shingles to anyone.  She does her own house cleaning which is an added on apartment to her sons home.  He checks on her everyday.  She has good support from her family.  She is aware of up coming appts.  She got her PCP to give her a script of amiodarone til she sees cardiology coming up.  She states the cost of spiriva is high, she uses it as needed.  She is doing her neb treatments daily.  She advised her PCP.  Will continue to visit for heart failure, diet and medication management.  [place] [phone]

## 2021-12-26 NOTE — Progress Notes (Unsigned)
Electrophysiology Office Follow up Visit Note:    Date:  12/27/2021   ID:  Kathryn [name], DOB 1933/07/20, MRN [id number]  PCP:  Jerrol Banana., MD  [facility] Cardiologist:  Ida Rogue, MD  [facility] Electrophysiologist:  Vickie Epley, MD    Interval History:    Kathryn [name] is a 88 y.o. female who presents for a follow up visit. They were last seen in clinic August 24, 2020 for SVT.  She has been taking amiodarone 200 mg by mouth once daily.  The patient last saw Dr. Rockey Situ August 28, 2021.  At that appointment her palpitations seem to be well controlled.  She is with her son today in clinic.  She tells me she has been doing well overall.  Has not had any arrhythmias that she is aware of.  She ran out of her amiodarone about 1 month ago.      Past Medical History:  Diagnosis Date   Actinic keratosis    Aortic atherosclerosis ([place]) 05/25/2020   Arthritis    spinal stenosis   Cardiomyopathy - presumed to be nonischemic    a. 03/2020 Echo: EF 40-45%, gr2 DD. Nl RV fxn. Mildly dil LA. Mild MR/ao sclerosis; b. 03/2020 MV: EF 30-44%, no ischemia. Cor Ca2+ in pLAD.   CHF (congestive heart failure) (HCC)    COPD (chronic obstructive pulmonary disease) (HCC)    Coronary artery calcification seen on CT scan    a. 03/2020 MV: CT attenuation images show Ao and mod pLAD Ca2+.   Diabetes mellitus without complication ([place])    Gastric ulcer 06/2010   treated with Prolisec- states no problems now   Hyperlipidemia    Hypertension    PCP Dr Miguel Aschoff   [place]   Hypothyroidism    Left bundle branch block (LBBB)    Neuromuscular disorder (HCC)    slight numbness right toes- comes and goes   Peripheral vascular disease (HCC)    varicose veins left leg   Pneumonia    PSVT (paroxysmal supraventricular tachycardia)    a. 03/2020 Zio: Avg HR 87 (66-211).  4 beats NSVT.  43 PSVT episodes, longest 10h 65m@ avg of 153 bpm (max 211).  Seen by EP-->amio started (pt  wished to avoid EPS/RFCA).   Seasonal allergies    Shortness of breath    Skin cancer    multiple from face   Squamous cell carcinoma of skin 03/23/2013   L dorsal hand   Squamous cell carcinoma of skin 02/04/2014   R forearm/in situ, L pretibial   Squamous cell carcinoma of skin 09/24/2018   R thumb   Squamous cell carcinoma of skin 09/08/2019   L forearm - ED&C   Squamous cell carcinoma of skin 09/08/2019   R dosal hand - ED&C   Squamous cell carcinoma of skin 06/20/2021   R cheek preauricular, excised    Past Surgical History:  Procedure Laterality Date   BACK SURGERY     4/12  Dr AShellia Carwin for lumbar stenosis   BREAST CYST ASPIRATION Left 1965   negative   [place]  lumpectomy with biopsy   left   CARPAL TUNNEL RELEASE     right   COLONOSCOPY     COLONOSCOPY WITH PROPOFOL N/A 06/30/2015   Procedure: COLONOSCOPY WITH PROPOFOL;  Surgeon: PHulen Luster MD;  [facility];  Service: Gastroenterology;  Laterality: N/A;   COLONOSCOPY WITH PROPOFOL N/A 01/22/2017   Procedure:  COLONOSCOPY WITH PROPOFOL;  Surgeon: Lollie Sails, MD;  Location: [facility] ENDOSCOPY;  Service: Endoscopy;  Laterality: N/A;   COLONOSCOPY WITH PROPOFOL N/A 01/28/2019   Procedure: COLONOSCOPY WITH PROPOFOL;  Surgeon: Toledo, Benay Pike, MD;  Location: [facility] ENDOSCOPY;  Service: Gastroenterology;  Laterality: N/A;   ESOPHAGOGASTRODUODENOSCOPY     ESOPHAGOGASTRODUODENOSCOPY (EGD) WITH PROPOFOL N/A 01/28/2019   Procedure: ESOPHAGOGASTRODUODENOSCOPY (EGD) WITH PROPOFOL;  Surgeon: Toledo, Benay Pike, MD;  Location: [facility] ENDOSCOPY;  Service: Gastroenterology;  Laterality: N/A;   EYE SURGERY     cataract extraction with IOL bilaterally   JOINT REPLACEMENT     LUMBAR LAMINECTOMY/DECOMPRESSION MICRODISCECTOMY  05/24/2011   Procedure: LUMBAR LAMINECTOMY/DECOMPRESSION MICRODISCECTOMY 2 LEVELS;  Surgeon: Magnus Sinning, MD;  Location: [facility] ORS;  Service: Orthopedics;  Laterality: N/A;  L2-L3, L3-L4  (x-ray)   RIGHT OOPHORECTOMY     due to STAPH INFECTION    Current Medications: Current Meds  Medication Sig   acetaminophen (TYLENOL) 500 MG tablet Take 1,000 mg by mouth every 6 (six) hours as needed. Pain    albuterol (PROVENTIL) (2.5 MG/3ML) 0.083% nebulizer solution USE 1 VIAL VIA NEBULIZER EVERY 6 HOURS AS NEEDED FOR WHEEZING OR SHORTNESS OF BREATH   aspirin 81 MG EC tablet Take 81 mg by mouth daily. Swallow whole.   atorvastatin (LIPITOR) 40 MG tablet TAKE 1 TABLET(40 MG) BY MOUTH DAILY   carvedilol (COREG) 6.25 MG tablet TAKE 1 TABLET(6.25 MG) BY MOUTH TWICE DAILY WITH A MEAL   FARXIGA 10 MG TABS tablet TAKE 1 TABLET(10 MG) BY MOUTH DAILY BEFORE BREAKFAST   glucose blood (CONTOUR NEXT TEST) test strip Check sugar once daily  DX E11.9   hydrOXYzine (ATARAX) 10 MG tablet TAKE 1 TO 2 TABLETS(10 TO 20 MG) BY MOUTH EVERY 6 HOURS AS NEEDED   levothyroxine (SYNTHROID) 100 MCG tablet TAKE 1 TABLET(100 MCG) BY MOUTH DAILY BEFORE BREAKFAST   lidocaine (LIDODERM) 5 % 1 patch daily.   losartan (COZAAR) 25 MG tablet Take 1 tablet (25 mg total) by mouth every evening.   metFORMIN (GLUCOPHAGE) 1000 MG tablet TAKE 1 TABLET(1000 MG) BY MOUTH DAILY WITH SUPPER   omeprazole (PRILOSEC) 20 MG capsule TAKE 1 CAPSULE(20 MG) BY MOUTH DAILY   pregabalin (LYRICA) 75 MG capsule Take 1 capsule (75 mg total) by mouth 2 (two) times daily.   SPIRIVA HANDIHALER 18 MCG inhalation capsule PLACE 1 CAPSULE INTO INHALER AND INHALE EVERY DAY AS NEEDED   Torsemide 40 MG TABS Take 40 mg by mouth daily. May take extra 20 mg (1/2 tab) after lunch as needed for swelling     Allergies:   Oxycodone and Prednisone   Social History   Socioeconomic History   Marital status: Widowed    Spouse name: Not on file   Number of children: Not on file   Years of education: Not on file   Highest education level: Not on file  Occupational History   Not on file  Tobacco Use   Smoking status: Former    Packs/day: 0.50     Years: 50.00    Total pack years: 25.00    Types: Cigarettes    Quit date: 05/16/2004    Years since quitting: 17.6   Smokeless tobacco: Never  Vaping Use   Vaping Use: Never used  Substance and Sexual Activity   Alcohol use: Yes    Comment: socially-1 glass of wine or [place] icea tea once a month   Drug use: No   Sexual activity: Yes  Birth control/protection: None  Other Topics Concern   Not on file  Social History Narrative   Not on file   Social Determinants of Health   Financial Resource Strain: Not on file  Food Insecurity: No Food Insecurity (10/18/2020)   Hunger Vital Sign    Worried About Running Out of Food in the Last Year: Never true    [place] in the Last Year: Never true  Transportation Needs: No Transportation Needs (10/18/2020)   PRAPARE - Hydrologist (Medical): No    Lack of Transportation (Non-Medical): No  Physical Activity: Not on file  Stress: Not on file  Social Connections: Not on file     Family History: The patient's family history includes Arthritis in her son; Breast cancer in her maternal aunt; Dementia in her mother; Heart disease in her father; Hypertension in her sister; Lung cancer in her father.  ROS:   Please see the history of present illness.    All other systems reviewed and are negative.  EKGs/Labs/Other Studies Reviewed:    The following studies were reviewed today:   EKG:  The ekg ordered today demonstrates sinus rhythm.  Incomplete left bundle branch block.  Recent Labs: 01/20/2021: BNP 170.3 12/12/2021: ALT 11; BUN 20; Creatinine, Ser 1.01; Hemoglobin 10.5; Platelets 310; Potassium 4.4; Sodium 143; TSH 3.620  Recent Lipid Panel    Component Value Date/Time   CHOL 139 12/12/2021 1116   TRIG 204 (H) 12/12/2021 1116   HDL 66 12/12/2021 1116   CHOLHDL 2.1 12/12/2021 1116   CHOLHDL 5.9 04/13/2020 0909   VLDL 48 (H) 04/13/2020 0909   LDLCALC 41 12/12/2021 1116    December 12, 2021 lab work shows an AST of 14, ALT of 11 and a TSH of 3.6  Physical Exam:    VS:  BP (!) 118/52   Pulse 64   Ht '5\' 4"'$  (1.626 m)   Wt 173 lb (78.5 kg)   SpO2 97%   BMI 29.70 kg/m     Wt Readings from Last 3 Encounters:  12/27/21 173 lb (78.5 kg)  12/12/21 170 lb (77.1 kg)  10/26/21 171 lb (77.6 kg)     GEN:  Well nourished, well developed in no acute distress.  Elderly. HEENT: Normal NECK: No JVD; No carotid bruits LYMPHATICS: No lymphadenopathy CARDIAC: RRR, no murmurs, rubs, gallops RESPIRATORY:  Clear to auscultation without rales, wheezing or rhonchi  ABDOMEN: Soft, non-tender, non-distended MUSCULOSKELETAL:  No edema; No deformity  SKIN: Warm and dry NEUROLOGIC:  Alert and oriented x 3 PSYCHIATRIC:  Normal affect        ASSESSMENT:    1. PSVT (paroxysmal supraventricular tachycardia)   2. Encounter for long-term (current) use of high-risk medication   3. Chronic systolic heart failure (HCC)    PLAN:    In order of problems listed above:   #SVT #Amiodarone use Doing well on amiodarone but ran out about 1 month ago.  Thankfully she has not had a recurrence.  I will refill her medicine but decrease the dose to 100 mg by mouth once daily.  Blood work recently showed stable liver and thyroid function.  I will have her follow-up with an APP in 6 months.  At that appointment she should have repeat CMP, TSH and free T4.  #Chronic systolic heart failure NYHA class II.  Warm and dry on exam.  Continue current medications.   Follow-up 6 months with APP.   Medication Adjustments/Labs and  Tests Ordered: Current medicines are reviewed at length with the patient today.  Concerns regarding medicines are outlined above.  No orders of the defined types were placed in this encounter.  No orders of the defined types were placed in this encounter.    Signed, Lars Mage, MD, [facility], [facility] 12/27/2021 10:45 AM    Electrophysiology [facility]  HeartCare

## 2021-12-27 ENCOUNTER — Ambulatory Visit: Payer: Medicare Other | Attending: Cardiology | Admitting: Cardiology

## 2021-12-27 ENCOUNTER — Encounter: Payer: Self-pay | Admitting: Cardiology

## 2021-12-27 VITALS — BP 118/52 | HR 64 | Ht 64.0 in | Wt 173.0 lb

## 2021-12-27 DIAGNOSIS — Z79899 Other long term (current) drug therapy: Secondary | ICD-10-CM | POA: Insufficient documentation

## 2021-12-27 DIAGNOSIS — I5022 Chronic systolic (congestive) heart failure: Secondary | ICD-10-CM | POA: Diagnosis not present

## 2021-12-27 DIAGNOSIS — I471 Supraventricular tachycardia, unspecified: Secondary | ICD-10-CM | POA: Insufficient documentation

## 2021-12-27 MED ORDER — AMIODARONE HCL 200 MG PO TABS: 100.0000 mg | ORAL_TABLET | Freq: Every day | ORAL | 3 refills | Status: DC

## 2021-12-27 NOTE — Patient Instructions (Signed)
Medication Instructions:  Decrease Amiodarone to 100 mg daily  *If you need a refill on your cardiac medications before your next appointment, please call your pharmacy*   Lab Work: None  If you have labs (blood work) drawn today and your tests are completely normal, you will receive your results only by: [place] (if you have MyChart) OR A paper copy in the mail If you have any lab test that is abnormal or we need to change your treatment, we will call you to review the results.   Testing/Procedures: None    Follow-Up: At [facility], you and your health needs are our priority.  As part of our continuing mission to provide you with exceptional heart care, we have created designated Provider Care Teams.  These Care Teams include your primary Cardiologist (physician) and Advanced Practice Providers (APPs -  Physician Assistants and Nurse Practitioners) who all work together to provide you with the care you need, when you need it.  We recommend signing up for the patient portal called "MyChart".  Sign up information is provided on this After Visit Summary.  MyChart is used to connect with patients for Virtual Visits (Telemedicine).  Patients are able to view lab/test results, encounter notes, upcoming appointments, etc.  Non-urgent messages can be sent to your provider as well.   To learn more about what you can do with MyChart, go to [email or web address].    Your next appointment:   6 month(s)  The format for your next appointment:   In Person  Provider:   You will see one of the following Advanced Practice Providers on your designated Care Team:   Murray Hodgkins, NP Christell Faith, PA-C Cadence Kathlen Mody, PA-C Gerrie Nordmann, NP      Other Instructions None   Important Information About Sugar

## 2021-12-31 ENCOUNTER — Other Ambulatory Visit: Payer: Self-pay | Admitting: Family Medicine

## 2022-01-01 DIAGNOSIS — Z23 Encounter for immunization: Secondary | ICD-10-CM | POA: Diagnosis not present

## 2022-01-01 NOTE — Telephone Encounter (Signed)
Called pt regarding where she will be getting her care since dr. Rosanna Randy has left. Pt is unsure. She may go with Dr. Rosanna Randy, but does not want to be without a provider, since he is not moving to the new clinic until January. PT will call [facility] if she has any medical issues between now and then.

## 2022-01-03 ENCOUNTER — Telehealth (HOSPITAL_COMMUNITY): Payer: Self-pay

## 2022-01-03 NOTE — Telephone Encounter (Signed)
Today received a phone call from [facility].  She states she has been having more shortness of breath and wheezing lately.  She states getting worries about it.  She wanted cardiology number, gave it to her but she says it does not work.  She was speaking in full sentences.  Did not hear her in immediate distress.  Asked he if she has been doing her breathing treatment and she advised every morning.  Advised her for shortness of breath and wheezing she can do them up to 4 times a day.  Advised her to do one now and call e back to see if improved.  I contacted her back and she said it helped.  She said she will do 1 more before bedtime.  Advised her to do maybe 3 a day then she can wean herself down to twice a day.  Advised her anytime she is short of breath or hear herself wheezing she needs to do a breathing treatment.  She appeared to understand.   [place] [phone]

## 2022-01-04 ENCOUNTER — Ambulatory Visit: Payer: Medicare Other | Admitting: Dermatology

## 2022-01-16 ENCOUNTER — Encounter: Payer: Self-pay | Admitting: Family Medicine

## 2022-01-16 ENCOUNTER — Ambulatory Visit (INDEPENDENT_AMBULATORY_CARE_PROVIDER_SITE_OTHER): Payer: Medicare Other | Admitting: Family Medicine

## 2022-01-16 VITALS — BP 132/67 | HR 73 | Resp 16 | Wt 163.0 lb

## 2022-01-16 DIAGNOSIS — S81801A Unspecified open wound, right lower leg, initial encounter: Secondary | ICD-10-CM | POA: Diagnosis not present

## 2022-01-16 DIAGNOSIS — L97319 Non-pressure chronic ulcer of right ankle with unspecified severity: Secondary | ICD-10-CM | POA: Diagnosis not present

## 2022-01-16 MED ORDER — CEPHALEXIN 500 MG PO CAPS: 500.0000 mg | ORAL_CAPSULE | Freq: Four times a day (QID) | ORAL | 0 refills | Status: AC

## 2022-01-16 NOTE — Assessment & Plan Note (Signed)
3 ulcerated wounds to medial and lateral sides of R lower leg without tunneling. Some surrounding erythema to proximal medial wound, will provide keflex x5 days. Given vascular history, diabetic, ulcerated wounds with difficulty healing, obtain ABI to assess for PAD. Refer to wound care.

## 2022-01-16 NOTE — Progress Notes (Signed)
   SUBJECTIVE:   CHIEF COMPLAINT / HPI:   LEG WOUND - noticing few leg wounds to side of R leg with sloughing and weeping for the past 2 weeks, worse over the last week. - has tried peroxide, neosporin without improvement - noticing leg swelling over the last few days with pain. - denies fevers, loss of appetite  - h/o DM, CHF  OBJECTIVE:   BP 132/67 (BP Location: Left Arm, Patient Position: Sitting, Cuff Size: Normal)   Pulse 73   Resp 16   Wt 163 lb (73.9 kg)   SpO2 97%   BMI 27.98 kg/m   Gen: well appearing, in NAD Ext: R leg: 3 superficial ulcerated wounds (two 1 cm in diameter to medial R lower leg, one 1.5 cm in diameter to lateral R lower leg at ankle) with overlying slough and erythematous border, TTP. No tunneling present.    ASSESSMENT/PLAN:   Leg wound, right 3 ulcerated wounds to medial and lateral sides of R lower leg without tunneling. Some surrounding erythema to proximal medial wound, will provide keflex x5 days. Given vascular history, diabetic, ulcerated wounds with difficulty healing, obtain ABI to assess for PAD. Refer to wound care.      Myles Gip, DO

## 2022-01-22 ENCOUNTER — Other Ambulatory Visit (INDEPENDENT_AMBULATORY_CARE_PROVIDER_SITE_OTHER): Payer: Self-pay | Admitting: Family Medicine

## 2022-01-22 DIAGNOSIS — I739 Peripheral vascular disease, unspecified: Secondary | ICD-10-CM

## 2022-01-22 DIAGNOSIS — L97319 Non-pressure chronic ulcer of right ankle with unspecified severity: Secondary | ICD-10-CM

## 2022-01-24 ENCOUNTER — Ambulatory Visit (INDEPENDENT_AMBULATORY_CARE_PROVIDER_SITE_OTHER): Payer: Medicare Other

## 2022-01-24 DIAGNOSIS — L97319 Non-pressure chronic ulcer of right ankle with unspecified severity: Secondary | ICD-10-CM

## 2022-01-29 ENCOUNTER — Other Ambulatory Visit: Payer: Self-pay

## 2022-01-29 ENCOUNTER — Telehealth: Payer: Self-pay

## 2022-01-29 DIAGNOSIS — I739 Peripheral vascular disease, unspecified: Secondary | ICD-10-CM | POA: Insufficient documentation

## 2022-01-29 DIAGNOSIS — I7025 Atherosclerosis of native arteries of other extremities with ulceration: Secondary | ICD-10-CM | POA: Insufficient documentation

## 2022-01-29 MED ORDER — ATORVASTATIN CALCIUM 40 MG PO TABS: ORAL_TABLET | ORAL | 0 refills | Status: DC

## 2022-01-29 MED ORDER — CARVEDILOL 6.25 MG PO TABS: ORAL_TABLET | ORAL | 0 refills | Status: DC

## 2022-01-29 NOTE — Telephone Encounter (Signed)
Copied from [place] [id number] [id number]. Topic: General - Other >> Jan 29, 2022 10:50 AM [place] Sous wrote: Reason for CRM: Pt returned missed call from office, please advise

## 2022-01-29 NOTE — Telephone Encounter (Signed)
Kathryn [name], did you call patient by any chance? I see a referral was placed for her to see Vascular.

## 2022-02-01 ENCOUNTER — Telehealth (INDEPENDENT_AMBULATORY_CARE_PROVIDER_SITE_OTHER): Payer: Self-pay

## 2022-02-01 NOTE — Telephone Encounter (Signed)
Pt LVM on 01-29-22 concerning a referral her dr had sent over to see if sh ecan get the appt scheduled.  Please return pt's call.

## 2022-02-04 IMAGING — DX DG CHEST 1V PORT
1 series · 1 of 1 positions shown · non-contrast
Comparison: 03/24/2020

CLINICAL DATA: Dyspnea

EXAM:
PORTABLE CHEST 1 VIEW

[chest ap]
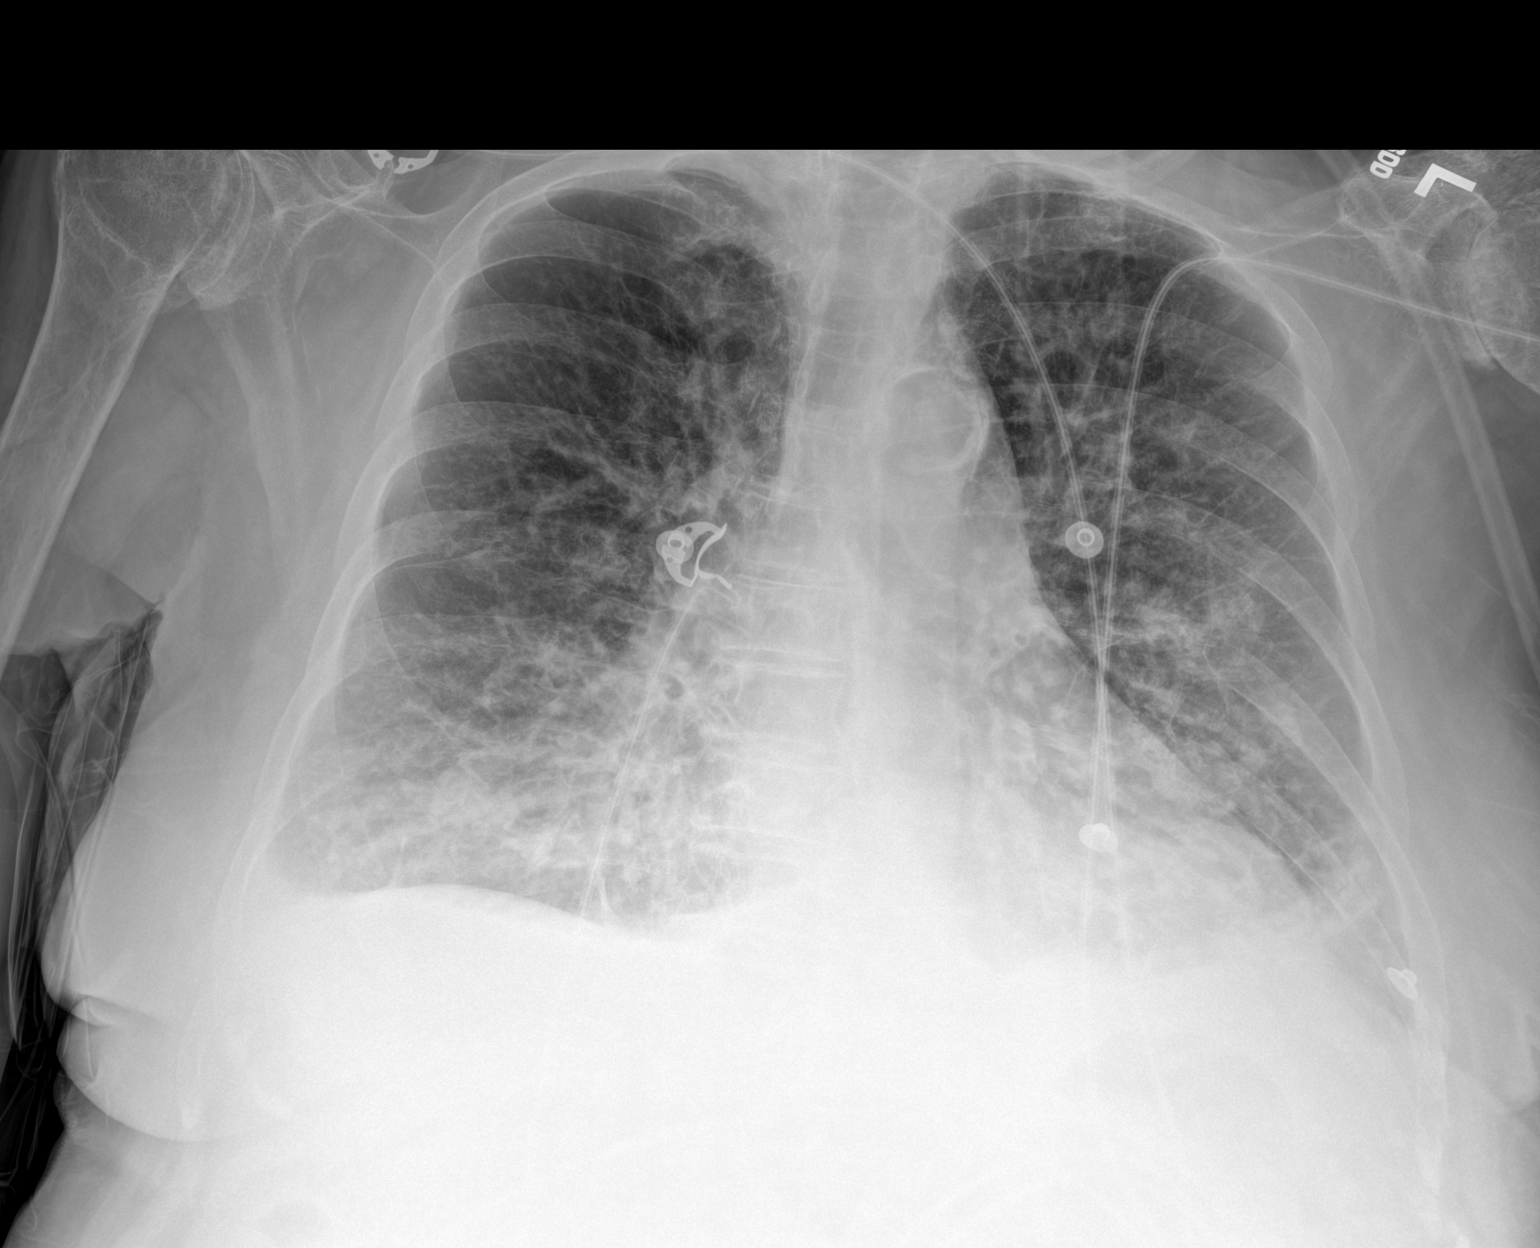

[1 of 1 positions shown; findings below may reference images not displayed]

FINDINGS: Pulmonary insufflation has slightly decreased, however, lung volumes
are normal. There has developed, however, progressive patchy
interstitial and airspace infiltrate predominantly within the lung
bases bilaterally, infection versus aspiration. Tiny bilateral
pleural effusions have developed. No pneumothorax. Cardiac size
within normal limits. No acute bone abnormality. Degenerative
changes are seen within the shoulders bilaterally.
IMPRESSION: Progressive, asymmetric, predominantly bibasilar pulmonary
infiltrate, likely related to infection or aspiration. Interval
development of tiny bilateral pleural effusions.

## 2022-02-05 ENCOUNTER — Telehealth: Payer: Self-pay | Admitting: Family Medicine

## 2022-02-05 DIAGNOSIS — E119 Type 2 diabetes mellitus without complications: Secondary | ICD-10-CM

## 2022-02-05 MED ORDER — METFORMIN HCL 1000 MG PO TABS: ORAL_TABLET | ORAL | 1 refills | Status: DC

## 2022-02-05 NOTE — Telephone Encounter (Signed)
[place] faxed refill request for the following medications:   metFORMIN (GLUCOPHAGE) 1000 MG tablet    Please advise

## 2022-02-06 ENCOUNTER — Encounter: Payer: Medicare Other | Attending: Physician Assistant | Admitting: Physician Assistant

## 2022-02-06 DIAGNOSIS — E11621 Type 2 diabetes mellitus with foot ulcer: Secondary | ICD-10-CM | POA: Insufficient documentation

## 2022-02-06 DIAGNOSIS — J449 Chronic obstructive pulmonary disease, unspecified: Secondary | ICD-10-CM | POA: Insufficient documentation

## 2022-02-06 DIAGNOSIS — L97522 Non-pressure chronic ulcer of other part of left foot with fat layer exposed: Secondary | ICD-10-CM | POA: Insufficient documentation

## 2022-02-06 DIAGNOSIS — E1151 Type 2 diabetes mellitus with diabetic peripheral angiopathy without gangrene: Secondary | ICD-10-CM | POA: Diagnosis not present

## 2022-02-06 DIAGNOSIS — M069 Rheumatoid arthritis, unspecified: Secondary | ICD-10-CM | POA: Diagnosis not present

## 2022-02-06 DIAGNOSIS — L97812 Non-pressure chronic ulcer of other part of right lower leg with fat layer exposed: Secondary | ICD-10-CM | POA: Diagnosis not present

## 2022-02-06 DIAGNOSIS — E1140 Type 2 diabetes mellitus with diabetic neuropathy, unspecified: Secondary | ICD-10-CM | POA: Insufficient documentation

## 2022-02-06 DIAGNOSIS — I2510 Atherosclerotic heart disease of native coronary artery without angina pectoris: Secondary | ICD-10-CM | POA: Diagnosis not present

## 2022-02-06 DIAGNOSIS — L97212 Non-pressure chronic ulcer of right calf with fat layer exposed: Secondary | ICD-10-CM | POA: Diagnosis not present

## 2022-02-06 DIAGNOSIS — I5042 Chronic combined systolic (congestive) and diastolic (congestive) heart failure: Secondary | ICD-10-CM | POA: Diagnosis not present

## 2022-02-06 DIAGNOSIS — I110 Hypertensive heart disease with heart failure: Secondary | ICD-10-CM | POA: Insufficient documentation

## 2022-02-06 DIAGNOSIS — L97512 Non-pressure chronic ulcer of other part of right foot with fat layer exposed: Secondary | ICD-10-CM | POA: Insufficient documentation

## 2022-02-06 DIAGNOSIS — E11622 Type 2 diabetes mellitus with other skin ulcer: Secondary | ICD-10-CM | POA: Diagnosis not present

## 2022-02-07 NOTE — Progress Notes (Signed)
Kathryn, [name] ([id number]) [id number] Page 1 of 20 Visit Report for 02/06/2022 Allergy List Details Patient Name: Date of Service: Kathryn [name], [place] NNA L. 02/06/2022 9:45 A M Medical Record Number: [id number] Patient Account Number: [id number] Date of Birth/Sex: Treating RN: [birth year] (88 y.o. Kathryn [name] Primary Care Saivion Goettel: Rory Percy Other Clinician: Referring Michall Noffke: Treating Audy Dauphine/Extender: Lonell Face in Treatment: 0 Allergies Active Allergies No Known Drug Allergies Allergy Notes Electronic Signature(s) Signed: 02/06/2022 5:24:20 PM By: Gretta Cool, BSN, RN, CWS, Kim RN, BSN Entered By: Gretta Cool, BSN, RN, CWS, Kim on 02/06/2022 09:52:31 -------------------------------------------------------------------------------- Arrival Information Details Patient Name: Date of Service: Kathryn [name], DEA NNA L. 02/06/2022 9:45 A M Medical Record Number: [id number] Patient Account Number: [id number] Date of Birth/Sex: Treating RN: 03-28-1933 (88 y.o. Kathryn [name] Primary Care Johnnie Moten: Rory Percy Other Clinician: Referring Kisha Messman: Treating Almadelia Looman/Extender: Lonell Face in Treatment: 0 Visit Information Patient Arrived: Wheel Chair Arrival Time: 09:42 Accompanied By: Sister Transfer Assistance: None Patient Identification Verified: Yes Secondary Verification Process Completed: Yes Patient Requires Transmission-Based Precautions: No Patient Has Alerts: Yes Patient Alerts: Patient on Blood Thinner 4m daily Type II Diabees Electronic Signature(s) Signed: 02/06/2022 5:24:20 PM By: WGretta Cool BSN, RN, CWS, Kim RN, BSN [place] ([phone] PM By: WGretta Cool BSN, RN, CWS, Kim RN, BSN [phone] Page 2 of 20 Signed: 02/06/2022 5:24:20 Entered By: WGretta Cool BSN, RN, CWS, Kim on 02/06/2022 09:49:54 -------------------------------------------------------------------------------- Clinic  Level of Care Assessment Details Patient Name: Date of Service: Kathryn [name] AllNNA L. 02/06/2022 9:45 A M Medical Record Number: [id number] Account Number: [id number] of Birth/Sex: Treating RN: 611-28-1935(88y.o. FMarlowe ShoresPrimary Care Lashea Goda: RRory PercyOther Clinician: Referring Nakeyia Menden: Treating Otelia Hettinger/Extender: SLonell Facein Treatment: 0 Clinic Level of Care Assessment Items TOOL 2 Quantity Score [] - 0 Use when only an EandM is performed on the INITIAL visit ASSESSMENTS - Nursing Assessment / Reassessment X- 1 20 General Physical Exam (combine w/ comprehensive assessment (listed just below) when performed on new pt. evals) X- 1 25 Comprehensive Assessment (HX, ROS, Risk Assessments, Wounds Hx, etc.) ASSESSMENTS - Wound and Skin A ssessment / Reassessment [] - 0 Simple Wound Assessment / Reassessment - one wound X- 7 5 Complex Wound Assessment / Reassessment - multiple wounds [] - 0 Dermatologic / Skin Assessment (not related to wound area) ASSESSMENTS - Ostomy and/or Continence Assessment and Care [] - 0 Incontinence Assessment and Management [] - 0 Ostomy Care Assessment and Management (repouching, etc.) PROCESS - Coordination of Care X - Simple Patient / Family Education for ongoing care 1 15 [] - 0 Complex (extensive) Patient / Family Education for ongoing care X- 1 10 Staff obtains CProgrammer, systems Records, T Results / Process Orders est [] - 0 Staff telephones HHA, Nursing Homes / Clarify orders / etc [] - 0 Routine Transfer to another Facility (non-emergent condition) [] - 0 Routine Hospital Admission (non-emergent condition) X- 1 15 New Admissions / IBiomedical engineer/ Ordering NPWT Apligraf, etc. , [] - 0 Emergency Hospital Admission (emergent condition) X- 1 10 Simple Discharge Coordination [] - 0 Complex (extensive) Discharge Coordination PROCESS - Special Needs [] - 0 Pediatric / Minor Patient  Management [] - 0 Isolation Patient Management [] - 0 Hearing / Language / Visual special needs [] - 0 Assessment of Community assistance (transportation, D/C planning, etc.) [] - 0 Additional assistance / Altered mentation [] - 0 Support Surface(s) Assessment (bed, cushion, seat,  etc.) EDMONIA, GONSER ([id number]) [id number] Page 3 of 20 INTERVENTIONS - Wound Cleansing / Measurement X- 1 5 Wound Imaging (photographs - any number of wounds) [] - 0 Wound Tracing (instead of photographs) [] - 0 Simple Wound Measurement - one wound X- 7 5 Complex Wound Measurement - multiple wounds [] - 0 Simple Wound Cleansing - one wound X- 7 5 Complex Wound Cleansing - multiple wounds INTERVENTIONS - Wound Dressings X - Small Wound Dressing one or multiple wounds 3 10 X- 1 15 Medium Wound Dressing one or multiple wounds [] - 0 Large Wound Dressing one or multiple wounds [] - 0 Application of Medications - injection INTERVENTIONS - Miscellaneous [] - 0 External ear exam [] - 0 Specimen Collection (cultures, biopsies, blood, body fluids, etc.) [] - 0 Specimen(s) / Culture(s) sent or taken to Lab for analysis [] - 0 Patient Transfer (multiple staff / Civil Service fast streamer / Similar devices) [] - 0 Simple Staple / Suture removal (25 or less) [] - 0 Complex Staple / Suture removal (26 or more) [] - 0 Hypo / Hyperglycemic Management (close monitor of Blood Glucose) [] - 0 Ankle / Brachial Index (ABI) - do not check if billed separately Has the patient been seen at the hospital within the last three years: Yes Total Score: 250 Level Of Care: New/Established - Level 5 Electronic Signature(s) Signed: 02/06/2022 5:24:20 PM By: Gretta Cool, BSN, RN, CWS, Kim RN, BSN Entered By: Gretta Cool, BSN, RN, CWS, Kim on 02/06/2022 11:43:08 -------------------------------------------------------------------------------- Encounter Discharge Information Details Patient Name: Date of  Service: Kathryn [name], DEA NNA L. 02/06/2022 9:45 A M Medical Record Number: [id number] Patient Account Number: [id number] Date of Birth/Sex: Treating RN: [birth year] (88 y.o. Kathryn [name] Primary Care Provider: Rory Percy Other Clinician: Referring Provider: Treating Provider/Extender: Lonell Face in Treatment: 0 Encounter Discharge Information Items Post Procedure Vitals Discharge Condition: Stable Temperature (F): 97.9 Ambulatory Status: Ambulatory Pulse (bpm): 71 Discharge Destination: Home Respiratory Rate (breaths/min): 16 Transportation: Private Auto Blood Pressure (mmHg): 106/66 Accompanied By: sister Schedule Follow-up Appointment: Yes Clinical Summary of CareRENESSA, WELLNITZ ([id number]) [id number] Page 4 of 20 Electronic Signature(s) Signed: 02/06/2022 11:45:22 AM By: Gretta Cool, BSN, RN, CWS, Kim RN, BSN Entered By: Gretta Cool, BSN, RN, CWS, Kim on 02/06/2022 11:45:22 -------------------------------------------------------------------------------- Lower Extremity Assessment Details Patient Name: Date of Service: Kathryn [name], DEA NNA L. 02/06/2022 9:45 A M Medical Record Number: [id number] Patient Account Number: [id number] Date of Birth/Sex: Treating RN: 01/13/1934 (88 y.o. Charolette Forward, Kim Primary Care Provider: Rory Percy Other Clinician: Referring Provider: Treating Provider/Extender: Lonell Face in Treatment: 0 Edema Assessment Assessed: [Left: No] [Right: No] [Left: Edema] [Right: :] Calf Left: Right: Point of Measurement: 31 cm From Medial Instep 37 cm 37.2 cm Ankle Left: Right: Point of Measurement: 12 cm From Medial Instep 25 cm 24 cm Knee To Floor Left: Right: From Medial Instep 39 cm 39 cm Vascular Assessment Pulses: Dorsalis Pedis Palpable: [Left:Yes] [Right:Yes] Posterior Tibial Palpable: [Left:Yes] [Right:Yes] Blood Pressure: Brachial: [Left:115] [Right:153] Dorsalis Pedis:  92 [Left:Dorsalis Pedis: 101] Ankle: Posterior Tibial: 125 [Left:Posterior Tibial: 94 0.82] [Right:0.66] Notes [facility] 01/28/2022, TBI L).45 R)0.61 Electronic Signature(s) Signed: 02/06/2022 5:24:20 PM By: Gretta Cool, BSN, RN, CWS, Kim RN, BSN Entered By: Gretta Cool, BSN, RN, CWS, Kim on 02/06/2022 10:15:54 Thom Chimes ([id number]) [id number] Page 5 of 20 -------------------------------------------------------------------------------- Multi Wound Chart Details Patient Name: Date of Service: [name] NNA L. 02/06/2022 9:45 A M Medical Record Number: [id number] Patient Account  Number: [id number] Date of Birth/Sex: Treating RN: 08/10/1933 (88 y.o. Charolette Forward, Kim Primary Care Kiaira Pointer: Rory Percy Other Clinician: Referring Akshaj Besancon: Treating Talvin Christianson/Extender: Lonell Face in Treatment: 0 Vital Signs Height(in): 18 Pulse(bpm): 36 Weight(lbs): 178 Blood Pressure(mmHg): 106/66 Body Mass Index(BMI): 30.6 Temperature(F): 97.9 Respiratory Rate(breaths/min): 18 [1:Photos:] Left T Fourth oe Left, Lateral Foot Right, Lateral Lower Leg Wound Location: Trauma Gradually Appeared Gradually Appeared Wounding Event: Diabetic Wound/Ulcer of the Lower Diabetic Wound/Ulcer of the Lower Diabetic Wound/Ulcer of the Lower Primary Etiology: Extremity Extremity Extremity Chronic Obstructive Pulmonary Chronic Obstructive Pulmonary Chronic Obstructive Pulmonary Comorbid History: Disease (COPD), Congestive Heart Disease (COPD), Congestive Heart Disease (COPD), Congestive Heart Failure, Coronary Artery Disease, Failure, Coronary Artery Disease, Failure, Coronary Artery Disease, Hypertension, Peripheral Arterial Hypertension, Peripheral Arterial Hypertension, Peripheral Arterial Disease, Peripheral Venous Disease, Disease, Peripheral Venous Disease, Disease, Peripheral Venous Disease, Type II Diabetes, Rheumatoid Arthritis, Type II Diabetes, Rheumatoid  Arthritis, Type II Diabetes, Rheumatoid Arthritis, Neuropathy Neuropathy Neuropathy 01/22/2022 01/22/2022 01/22/2022 Date Acquired: 0 0 0 Weeks of Treatment: Open Open Open Wound Status: No No No Wound Recurrence: 0.9x0.7x0.1 1x0.5x0.1 1x0.8x0.1 Measurements L x W x D (cm) 0.495 0.393 0.628 A (cm) : rea 0.049 0.039 0.063 Volume (cm) : Grade 1 Grade 1 Grade 1 Classification: Medium Medium Medium Exudate A mount: Serous Serous Serous Exudate Type: amber amber amber Exudate Color: None Present (0%) None Present (0%) None Present (0%) Granulation A mount: Large (67-100%) Large (67-100%) Large (67-100%) Necrotic A mount: Adherent Slough Adherent Slough Eschar Necrotic Tissue: Fat Layer (Subcutaneous Tissue): Yes Fat Layer (Subcutaneous Tissue): Yes Fascia: No Exposed Structures: Fascia: No Fascia: No Fat Layer (Subcutaneous Tissue): No Tendon: No Tendon: No Tendon: No Muscle: No Muscle: No Muscle: No Joint: No Joint: No Joint: No Bone: No Bone: No Bone: No Wound Number: _0 PhotosANAYANSI, RUNDQUIST ([id number]) [id number] Page 6 of 20 Left, Proximal Lower Leg Right, Distal, Posterior Lower Leg Right, Proximal, Posterior Lower Leg Wound Location: Gradually Appeared Gradually Appeared Gradually Appeared Wounding Event: Diabetic Wound/Ulcer of the Lower Diabetic Wound/Ulcer of the Lower Diabetic Wound/Ulcer of the Lower Primary Etiology: Extremity Extremity Extremity Chronic Obstructive Pulmonary N/A Chronic Obstructive Pulmonary Comorbid History: Disease (COPD), Congestive Heart Disease (COPD), Congestive Heart Failure, Coronary Artery Disease, Failure, Coronary Artery Disease, Hypertension, Peripheral Arterial Hypertension, Peripheral Arterial Disease, Peripheral Venous Disease, Disease, Peripheral Venous Disease, Type II Diabetes, Rheumatoid Arthritis, Type II Diabetes, Rheumatoid Arthritis, Neuropathy Neuropathy 01/15/2022  01/15/2022 12/18/2021 Date Acquired: 0 0 0 Weeks of Treatment: Open Open Open Wound Status: No No No Wound Recurrence: 0.9x0.5x0.1 1x0.6x0.2 0.4x0.3x0.1 Measurements L x W x D (cm) 0.353 0.471 0.094 A (cm) : rea 0.035 0.094 0.009 Volume (cm) : Unable to visualize wound bed N/A Grade 1 Classification: None Present N/A Medium Exudate A mount: N/A N/A Serous Exudate Type: N/A N/A amber Exudate Color: None Present (0%) N/A None Present (0%) Granulation A mount: Large (67-100%) N/A Large (67-100%) Necrotic A mount: Eschar N/A Adherent Slough Necrotic Tissue: Fascia: No N/A Fat Layer (Subcutaneous Tissue): Yes Exposed Structures: Fat Layer (Subcutaneous Tissue): No Fascia: No Tendon: No Tendon: No Muscle: No Muscle: No Joint: No Joint: No Bone: No Bone: No Wound Number: 7 N/A N/A Photos: N/A N/A Right T Second oe N/A N/A Wound Location: Gradually Appeared N/A N/A Wounding Event: Diabetic Wound/Ulcer of the Lower N/A N/A Primary Etiology: Extremity Chronic Obstructive Pulmonary N/A N/A Comorbid History: Disease (COPD), Congestive Heart Failure, Coronary Artery Disease, Hypertension, Peripheral Arterial Disease, Peripheral  Venous Disease, Type II Diabetes, Rheumatoid Arthritis, Neuropathy 01/15/2022 N/A N/A Date Acquired: 0 N/A N/A Weeks of Treatment: Open N/A N/A Wound Status: No N/A N/A Wound Recurrence: 0.5x0.5x0.2 N/A N/A Measurements L x W x D (cm) 0.196 N/A N/A A (cm) : rea 0.039 N/A N/A Volume (cm) : Grade 1 N/A N/A Classification: N/A N/A N/A Exudate A mount: N/A N/A N/A Exudate Type: N/A N/A N/A Exudate Color: None Present (0%) N/A N/A Granulation A mount: Large (67-100%) N/A N/A Necrotic A mount: Adherent Slough N/A N/A Necrotic Tissue: Fat Layer (Subcutaneous Tissue): Yes N/A N/A Exposed Structures: Fascia: No Tendon: No Muscle: No Joint: No Bone: No Treatment Notes Electronic Signature(s) Signed: 02/06/2022  5:24:20 PM By: Gretta Cool, BSN, RN, CWS, Kim RN, BSN Entered By: Gretta Cool, BSN, RN, CWS, Kim on 02/06/2022 10:53:48 Thom Chimes ([id number]) [id number] Page 7 of 20 -------------------------------------------------------------------------------- Multi-Disciplinary Care Plan Details Patient Name: Date of Service: Kathryn [name], [place] NNA L. 02/06/2022 9:45 A M Medical Record Number: [id number] Patient Account Number: [id number] Date of Birth/Sex: Treating RN: September 27, 1933 (88 y.o. Kathryn [name] Primary Care Provider: Rory Percy Other Clinician: Referring Provider: Treating Provider/Extender: Lonell Face in Treatment: 0 Active Inactive Abuse / Safety / Falls / Self Care Management Nursing Diagnoses: History of Falls Impaired physical mobility Potential for falls Potential for injury related to falls Goals: Patient/caregiver will verbalize understanding of skin care regimen Date Initiated: 02/06/2022 Target Resolution Date: 02/06/2022 Goal Status: Active Patient/caregiver will verbalize/demonstrate measures taken to improve the patient's personal safety Date Initiated: 02/06/2022 Target Resolution Date: 02/06/2022 Goal Status: Active Interventions: Assess fall risk on admission and as needed Notes: Necrotic Tissue Nursing Diagnoses: Impaired tissue integrity related to necrotic/devitalized tissue Knowledge deficit related to management of necrotic/devitalized tissue Goals: Necrotic/devitalized tissue will be minimized in the wound bed Date Initiated: 02/06/2022 Target Resolution Date: 02/06/2022 Goal Status: Active Patient/caregiver will verbalize understanding of reason and process for debridement of necrotic tissue Date Initiated: 02/06/2022 Target Resolution Date: 02/06/2022 Goal Status: Active Interventions: Assess patient pain level pre-, during and post procedure and prior to discharge Provide education on necrotic tissue and  debridement process Treatment Activities: Apply topical anesthetic as ordered : 02/06/2022 Excisional debridement : 02/06/2022 Notes: Orientation to the Wound Care Program Nursing Diagnoses: Knowledge deficit related to the wound healing center program GoalsLAURI, PURDUM ([id number]) [id number] Page 8 of 20 Patient/caregiver will verbalize understanding of the [place] Program Date Initiated: 02/06/2022 Target Resolution Date: 02/06/2022 Goal Status: Active Interventions: Provide education on orientation to the wound center Notes: Venous Leg Ulcer Nursing Diagnoses: Actual venous Insuffiency (use after diagnosis is confirmed) Knowledge deficit related to disease process and management Goals: Non-invasive venous studies are completed as ordered Date Initiated: 02/06/2022 Target Resolution Date: 01/25/2022 Goal Status: Active Patient will maintain optimal edema control Date Initiated: 02/06/2022 Target Resolution Date: 02/06/2022 Goal Status: Active Patient/caregiver will verbalize understanding of disease process and disease management Date Initiated: 02/06/2022 Target Resolution Date: 02/06/2022 Goal Status: Active Interventions: Assess peripheral edema status every visit. Notes: Wound/Skin Impairment Nursing Diagnoses: Impaired tissue integrity Knowledge deficit related to smoking impact on wound healing Goals: Patient/caregiver will verbalize understanding of skin care regimen Date Initiated: 02/06/2022 Target Resolution Date: 02/06/2022 Goal Status: Active Ulcer/skin breakdown will have a volume reduction of 30% by week 4 Date Initiated: 02/06/2022 Target Resolution Date: 02/27/2022 Goal Status: Active Interventions: Assess ulceration(s) every visit Treatment Activities: Referred to DME provider for dressing supplies : 02/06/2022 Skin care regimen initiated :  02/06/2022 Topical wound management initiated :  02/06/2022 Notes: Electronic Signature(s) Signed: 02/06/2022 5:24:20 PM By: Gretta Cool, BSN, RN, CWS, Kim RN, BSN Entered By: Gretta Cool, BSN, RN, CWS, Kim on 02/06/2022 10:53:07 -------------------------------------------------------------------------------- Pain Assessment Details Patient Name: Date of Service: Kathryn [name], DE[place] NNA L. 02/06/2022 9:45 A M Medical Record Number: [id number] Patient Account Number: [id number] FALICIA, LIZOTTE ([id number]) [id number] Page 9 of 20 Date of Birth/Sex: Treating RN: [birth year] (88 y.o. Kathryn [name] Primary Care Carmyn Hamm: Other Clinician: Rory Percy Referring Abby Stines: Treating Adyn Hoes/Extender: Lonell Face in Treatment: 0 Active Problems Location of Pain Severity and Description of Pain Patient Has Paino No Site Locations Pain Management and Medication Current Pain Management: Notes Patient states pain is better, T is real sore. oe Electronic Signature(s) Signed: 02/06/2022 5:24:20 PM By: Gretta Cool, BSN, RN, CWS, Kim RN, BSN Entered By: Gretta Cool, BSN, RN, CWS, Kim on 02/06/2022 09:51:33 -------------------------------------------------------------------------------- Patient/Caregiver Education Details Patient Name: Date of Service: Kathryn [name], DEA NNA L. 11/14/2023andnbsp9:45 A M Medical Record Number: [id number] Patient Account Number: [id number] Date of Birth/Gender: Treating RN: [birth year] (88 y.o. Kathryn [name] Primary Care Physician: Rory Percy Other Clinician: Referring Physician: Treating Physician/Extender: Lonell Face in Treatment: 0 Education Assessment Education Provided To: Patient Education Topics Provided Welcome T [facility] [place]: o Handouts: Welcome T [facility] [place] o Methods: Demonstration, Explain/Verbal Responses: State content correctly JERYN, BERTONI L ([id number]) [id number] Page 10 of 20 Wound  Debridement: Handouts: Wound Debridement Methods: Demonstration, Explain/Verbal Responses: State content correctly Wound/Skin Impairment: Handouts: Caring for Your Ulcer, Other: Wound care as prescribed Methods: Demonstration, Explain/Verbal Responses: State content correctly Electronic Signature(s) Signed: 02/06/2022 5:24:20 PM By: Gretta Cool, BSN, RN, CWS, Kim RN, BSN Entered By: Gretta Cool, BSN, RN, CWS, Kim on 02/06/2022 11:44:20 -------------------------------------------------------------------------------- Wound Assessment Details Patient Name: Date of Service: Kathryn [name], DEA NNA L. 02/06/2022 9:45 A M Medical Record Number: [id number] Patient Account Number: [id number] Date of Birth/Sex: Treating RN: [birth year] (88 y.o. Charolette Forward, Kim Primary Care Berdia Lachman: Rory Percy Other Clinician: Referring Demarquis Osley: Treating Mickie Kozikowski/Extender: Lonell Face in Treatment: 0 Wound Status Wound Number: 1 Primary Diabetic Wound/Ulcer of the Lower Extremity Etiology: Wound Location: Left T Fourth oe Wound Open Wounding Event: Trauma Status: Date Acquired: 01/22/2022 Comorbid Chronic Obstructive Pulmonary Disease (COPD), Congestive Heart Weeks Of Treatment: 0 History: Failure, Coronary Artery Disease, Hypertension, Peripheral Arterial Clustered Wound: No Disease, Peripheral Venous Disease, Type II Diabetes, Rheumatoid Arthritis, Neuropathy Photos Wound Measurements Length: (cm) Width: (cm) Depth: (cm) Area: (cm) Volume: (cm) 0.9 % Reduction in Area: 0.7 % Reduction in Volume: 0.1 0.495 0.049 Wound Description Classification: Grade 1 Exudate Amount: Medium Exudate Type: Serous Exudate Color: amber Tarr, Remingtyn L ([id number]) Wound Bed Granulation Amount: None Present (0%) Necrotic Amount: Large (67-100%) Necrotic Quality: Adherent Slough Foul Odor After Cleansing: No Slough/Fibrino Yes [id number] Page 11 of 20 Exposed  Structure Fascia Exposed: No Fat Layer (Subcutaneous Tissue) Exposed: Yes Tendon Exposed: No Muscle Exposed: No Joint Exposed: No Bone Exposed: No Treatment Notes Wound #1 (Toe Fourth) Wound Laterality: Left Cleanser Soap and Water Discharge Instruction: Gently cleanse wound with antibacterial soap, rinse and pat dry prior to dressing wounds Peri-Wound Care Topical Primary Dressing Xeroform-HBD 2x2 (in/in) Discharge Instruction: Apply Xeroform-HBD 2x2 (in/in) as directed Secondary Dressing Coverlet Latex-Free Fabric Adhesive Dressings Discharge Instruction: 1.5 x 2 Secured With Compression Wrap Compression Stockings Add-Ons Electronic Signature(s) Signed: 02/06/2022 10:31:09 AM By: Gretta Cool, BSN, RN, CWS, Kim RN, BSN Entered By: Gretta Cool,  BSN, RN, CWS, Kim on 02/06/2022 10:31:09 -------------------------------------------------------------------------------- Wound Assessment Details Patient Name: Date of Service: [name] NNA L. 02/06/2022 9:45 A M Medical Record Number: [id number] Patient Account Number: [id number] Date of Birth/Sex: Treating RN: 1933-05-13 (88 y.o. Kathryn [name] Primary Care Provider: Rory Percy Other Clinician: Referring Provider: Treating Provider/Extender: Lonell Face in Treatment: 0 Wound Status Wound Number: 2 Primary Diabetic Wound/Ulcer of the Lower Extremity Etiology: Wound Location: Left, Lateral Foot Wound Open Wounding Event: Gradually Appeared Status: Date Acquired: 01/22/2022 Comorbid Chronic Obstructive Pulmonary Disease (COPD), Congestive Heart Weeks Of Treatment: 0 History: Failure, Coronary Artery Disease, Hypertension, Peripheral Arterial Clustered Wound: No Disease, Peripheral Venous Disease, Type II Diabetes, Rheumatoid Arthritis, Neuropathy Photos CHASITTY, HEHL L ([id number]) [id number] Page 12 of 20 Wound Measurements Length: (cm) Width: (cm) Depth: (cm) Area:  (cm) Volume: (cm) 1 % Reduction in Area: 0.5 % Reduction in Volume: 0.1 0.393 0.039 Wound Description Classification: Grade 1 Exudate Amount: Medium Exudate Type: Serous Exudate Color: amber Foul Odor After Cleansing: No Slough/Fibrino Yes Wound Bed Granulation Amount: None Present (0%) Exposed Structure Necrotic Amount: Large (67-100%) Fascia Exposed: No Necrotic Quality: Adherent Slough Fat Layer (Subcutaneous Tissue) Exposed: Yes Tendon Exposed: No Muscle Exposed: No Joint Exposed: No Bone Exposed: No Treatment Notes Wound #2 (Foot) Wound Laterality: Left, Lateral Cleanser [place] Ancillary Kit - 15 Day Supply Discharge Instruction: Use supplies as instructed; Kit contains: (15) Saline Bullets; (15) 3x3 Gauze; 15 pr Gloves Soap and Water Discharge Instruction: Gently cleanse wound with antibacterial soap, rinse and pat dry prior to dressing wounds Peri-Wound Care Topical Primary Dressing Xeroform-HBD 2x2 (in/in) Discharge Instruction: Apply Xeroform-HBD 2x2 (in/in) as directed Secondary Dressing Coverlet Latex-Free Fabric Adhesive Dressings Discharge Instruction: 1.5 x 2 Secured With Compression Wrap Compression Stockings Add-Ons Electronic Signature(s) Signed: 02/06/2022 10:31:45 AM By: Gretta Cool, BSN, RN, CWS, Kim RN, BSN Entered By: Gretta Cool, BSN, RN, CWS, Kim on 02/06/2022 10:31:45 Thom Chimes ([id number]) [id number] Page 13 of 20 -------------------------------------------------------------------------------- Wound Assessment Details Patient Name: Date of Service: [name] NNA L. 02/06/2022 9:45 A M Medical Record Number: [id number] Patient Account Number: [id number] Date of Birth/Sex: Treating RN: 1933/12/09 (88 y.o. Charolette Forward, Kim Primary Care Provider: Rory Percy Other Clinician: Referring Provider: Treating Provider/Extender: Lonell Face in Treatment: 0 Wound Status Wound Number: 3 Primary  Diabetic Wound/Ulcer of the Lower Extremity Etiology: Wound Location: Right, Lateral Lower Leg Wound Open Wounding Event: Gradually Appeared Status: Date Acquired: 01/22/2022 Comorbid Chronic Obstructive Pulmonary Disease (COPD), Congestive Heart Weeks Of Treatment: 0 History: Failure, Coronary Artery Disease, Hypertension, Peripheral Arterial Clustered Wound: No Disease, Peripheral Venous Disease, Type II Diabetes, Rheumatoid Arthritis, Neuropathy Photos Wound Measurements Length: (cm) Width: (cm) Depth: (cm) Area: (cm) Volume: (cm) 1 % Reduction in Area: 0.8 % Reduction in Volume: 0.1 0.628 0.063 Wound Description Classification: Grade 1 Exudate Amount: Medium Exudate Type: Serous Exudate Color: amber Foul Odor After Cleansing: No Slough/Fibrino No Wound Bed Granulation Amount: None Present (0%) Exposed Structure Necrotic Amount: Large (67-100%) Fascia Exposed: No Necrotic Quality: Eschar Fat Layer (Subcutaneous Tissue) Exposed: Yes Tendon Exposed: No Muscle Exposed: No Joint Exposed: No Bone Exposed: No Treatment Notes Wound #3 (Lower Leg) Wound Laterality: Right, Lateral Cleanser Peri-Wound Care RUTHELL, FEIGENBAUM L ([id number]) [id number] Page 14 of 20 Topical Primary Dressing Xeroform-HBD 2x2 (in/in) Discharge Instruction: Apply Xeroform-HBD 2x2 (in/in) as directed Secondary Dressing [place] Roll-Medium Discharge Instruction: Apply Conforming Stretch Guaze Bandage as directed Secured With [place]  H Soft Cloth Surgical T ape ape, 2x2 (in/yd) Compression Wrap Compression Stockings Environmental education officer) Signed: 02/06/2022 11:47:12 AM By: Gretta Cool, BSN, RN, CWS, Kim RN, BSN Previous Signature: 02/06/2022 10:32:16 AM Version By: Gretta Cool, BSN, RN, CWS, Kim RN, BSN Entered By: Gretta Cool, BSN, RN, CWS, Kim on 02/06/2022  11:47:11 -------------------------------------------------------------------------------- Wound Assessment Details Patient Name: Date of Service: Kathryn [name], DEA NNA L. 02/06/2022 9:45 A M Medical Record Number: [id number] Patient Account Number: [id number] Date of Birth/Sex: Treating RN: 22-May-1933 (88 y.o. Charolette Forward, Kim Primary Care Provider: Rory Percy Other Clinician: Referring Provider: Treating Provider/Extender: Lonell Face in Treatment: 0 Wound Status Wound Number: 4 Primary Diabetic Wound/Ulcer of the Lower Extremity Etiology: Wound Location: Right, Proximal Lower Leg Wound Open Wounding Event: Gradually Appeared Status: Date Acquired: 01/15/2022 Comorbid Chronic Obstructive Pulmonary Disease (COPD), Congestive Heart Weeks Of Treatment: 0 History: Failure, Coronary Artery Disease, Hypertension, Peripheral Arterial Clustered Wound: No Disease, Peripheral Venous Disease, Type II Diabetes, Rheumatoid Arthritis, Neuropathy Photos Wound Measurements Length: (cm) 0.9 Width: (cm) 0.5 Depth: (cm) 0.1 Area: (cm) 0.353 Volume: (cm) 0.035 Mcdaid, Janaiya L ([id number]) % Reduction in Area: % Reduction in Volume: [id number] Page 15 of 20 Wound Description Classification: Grade 1 Exudate Amount: Medium Exudate Type: Serous Exudate Color: amber Foul Odor After Cleansing: No Slough/Fibrino No Wound Bed Granulation Amount: Small (1-33%) Exposed Structure Necrotic Amount: Large (67-100%) Fascia Exposed: No Fat Layer (Subcutaneous Tissue) Exposed: Yes Tendon Exposed: No Muscle Exposed: No Joint Exposed: No Bone Exposed: No Treatment Notes Wound #4 (Lower Leg) Wound Laterality: Right, Proximal Cleanser Peri-Wound Care Topical Primary Dressing Xeroform-HBD 2x2 (in/in) Discharge Instruction: Apply Xeroform-HBD 2x2 (in/in) as directed Secondary Dressing [place] Roll-Medium Discharge Instruction: Apply  Conforming Stretch Guaze Bandage as directed Secured With Medipore T - 7M Medipore H Soft Cloth Surgical T ape ape, 2x2 (in/yd) Compression Wrap Compression Stockings Environmental education officer) Signed: 02/06/2022 11:48:14 AM By: Gretta Cool, BSN, RN, CWS, Kim RN, BSN Previous Signature: 02/06/2022 10:32:43 AM Version By: Gretta Cool, BSN, RN, CWS, Kim RN, BSN Entered By: Gretta Cool, BSN, RN, CWS, Kim on 02/06/2022 11:48:14 -------------------------------------------------------------------------------- Wound Assessment Details Patient Name: Date of Service: Kathryn [name], DEA NNA L. 02/06/2022 9:45 A M Medical Record Number: [id number] Patient Account Number: [id number] Date of Birth/Sex: Treating RN: Oct 29, 1933 (88 y.o. Kathryn [name] Primary Care Provider: Rory Percy Other Clinician: Referring Provider: Treating Provider/Extender: Lonell Face in Treatment: 0 Wound Status Wound Number: 5 Primary Diabetic Wound/Ulcer of the Lower Extremity Etiology: Wound Location: Right, Distal, Posterior Lower Leg Wound Open Wounding Event: Gradually Appeared Status: Date Acquired: 01/15/2022 Comorbid Chronic Obstructive Pulmonary Disease (COPD), Congestive Heart Weeks Of Treatment: 0 History: Failure, Coronary Artery Disease, Hypertension, Peripheral Arterial Clustered Wound: No Maestre, Konner L ([id number]) [id number] Page 16 of 20 Clustered Wound: No Disease, Peripheral Venous Disease, Type II Diabetes, Rheumatoid Arthritis, Neuropathy Photos Wound Measurements Length: (cm) Width: (cm) Depth: (cm) Area: (cm) Volume: (cm) 1 % Reduction in Area: 0.6 % Reduction in Volume: 0.2 0.471 0.094 Wound Description Classification: Exudate Amount: Exudate Type: Exudate Color: Grade 1 Medium Serous amber Wound Bed Granulation Amount: None Present (0%) Exposed Structure Necrotic Amount: Large (67-100%) Fat Layer (Subcutaneous Tissue) Exposed:  Yes Necrotic Quality: Adherent Slough Treatment Notes Wound #5 (Lower Leg) Wound Laterality: Right, Posterior, Distal Cleanser Peri-Wound Care Topical Primary Dressing Xeroform-HBD 2x2 (in/in) Discharge Instruction: Apply Xeroform-HBD 2x2 (in/in) as directed Secondary Dressing [place] Roll-Medium Discharge Instruction: Apply Conforming Stretch Guaze Bandage as directed Secured With  [place] Soft Cloth Surgical T ape ape, 2x2 (in/yd) Compression Wrap Compression Stockings Environmental education officer) Signed: 02/06/2022 5:24:20 PM By: Gretta Cool, BSN, RN, CWS, Kim RN, BSN Entered By: Gretta Cool, BSN, RN, CWS, Kim on 02/06/2022 11:09:41 Thom Chimes ([id number]) [id number] Page 17 of 20 -------------------------------------------------------------------------------- Wound Assessment Details Patient Name: Date of Service: [name] NNA L. 02/06/2022 9:45 A M Medical Record Number: [id number] Patient Account Number: [id number] Date of Birth/Sex: Treating RN: 06/08/1933 (88 y.o. Charolette Forward, Kim Primary Care Provider: Rory Percy Other Clinician: Referring Provider: Treating Provider/Extender: Lonell Face in Treatment: 0 Wound Status Wound Number: 6 Primary Diabetic Wound/Ulcer of the Lower Extremity Etiology: Wound Location: Right, Proximal, Posterior Lower Leg Wound Open Wounding Event: Gradually Appeared Status: Date Acquired: 12/18/2021 Comorbid Chronic Obstructive Pulmonary Disease (COPD), Congestive Heart Weeks Of Treatment: 0 History: Failure, Coronary Artery Disease, Hypertension, Peripheral Arterial Clustered Wound: No Disease, Peripheral Venous Disease, Type II Diabetes, Rheumatoid Arthritis, Neuropathy Photos Wound Measurements Length: (cm) Width: (cm) Depth: (cm) Area: (cm) Volume: (cm) 0.4 % Reduction in Area: 0.3 % Reduction in Volume: 0.1 0.094 0.009 Wound  Description Classification: Grade 1 Exudate Amount: Medium Exudate Type: Serous Exudate Color: amber Slough/Fibrino Yes Wound Bed Granulation Amount: None Present (0%) Exposed Structure Necrotic Amount: Large (67-100%) Fascia Exposed: No Necrotic Quality: Adherent Slough Fat Layer (Subcutaneous Tissue) Exposed: Yes Tendon Exposed: No Muscle Exposed: No Joint Exposed: No Bone Exposed: No Treatment Notes Wound #6 (Lower Leg) Wound Laterality: Right, Posterior, Proximal Cleanser Peri-Wound Care MAHDIYA, MOSSBERG L ([id number]) [id number] Page 18 of 20 Topical Primary Dressing Xeroform-HBD 2x2 (in/in) Discharge Instruction: Apply Xeroform-HBD 2x2 (in/in) as directed Secondary Dressing [place] Roll-Medium Discharge Instruction: Apply Conforming Stretch Guaze Bandage as directed Secured With [place] H Soft Cloth Surgical T ape ape, 2x2 (in/yd) Compression Wrap Compression Stockings Environmental education officer) Signed: 02/06/2022 10:33:09 AM By: Gretta Cool, BSN, RN, CWS, Kim RN, BSN Entered By: Gretta Cool, BSN, RN, CWS, Kim on 02/06/2022 10:33:09 -------------------------------------------------------------------------------- Wound Assessment Details Patient Name: Date of Service: Kathryn [name], DEA NNA L. 02/06/2022 9:45 A M Medical Record Number: [id number] Patient Account Number: [id number] Date of Birth/Sex: Treating RN: Feb 26, 1934 (88 y.o. Charolette Forward, Kim Primary Care Provider: Rory Percy Other Clinician: Referring Provider: Treating Provider/Extender: Lonell Face in Treatment: 0 Wound Status Wound Number: 7 Primary Diabetic Wound/Ulcer of the Lower Extremity Etiology: Wound Location: Right T Second oe Wound Open Wounding Event: Gradually Appeared Status: Date Acquired: 01/15/2022 Comorbid Chronic Obstructive Pulmonary Disease (COPD), Congestive Heart Weeks Of Treatment: 0 History: Failure, Coronary  Artery Disease, Hypertension, Peripheral Arterial Clustered Wound: No Disease, Peripheral Venous Disease, Type II Diabetes, Rheumatoid Arthritis, Neuropathy Photos Wound Measurements Length: (cm) 0.5 Width: (cm) 0.5 Depth: (cm) 0.2 Area: (cm) 0.196 Volume: (cm) 0.039 Clagett, Tishia L ([id number]) Wound Description Classification: Exudate Amount: Exudate Type: Exudate Color: Grade 1 Medium Serous amber % Reduction in Area: % Reduction in Volume: [id number] Page 19 of 20 Wound Bed Granulation Amount: None Present (0%) Exposed Structure Necrotic Amount: Large (67-100%) Fascia Exposed: No Necrotic Quality: Adherent Slough Fat Layer (Subcutaneous Tissue) Exposed: Yes Tendon Exposed: No Muscle Exposed: No Joint Exposed: No Bone Exposed: No Treatment Notes Wound #7 (Toe Second) Wound Laterality: Right Cleanser Soap and Water Discharge Instruction: Gently cleanse wound with antibacterial soap, rinse and pat dry prior to dressing wounds Peri-Wound Care Topical Primary Dressing Xeroform-HBD 2x2 (in/in) Discharge Instruction: Apply Xeroform-HBD 2x2 (in/in) as directed Secondary  Dressing Coverlet Latex-Free Fabric Adhesive Dressings Discharge Instruction: 1.5 x 2 Secured With Compression Wrap Compression Stockings Add-Ons Electronic Signature(s) Signed: 02/06/2022 5:24:20 PM By: Gretta Cool, BSN, RN, CWS, Kim RN, BSN Previous Signature: 02/06/2022 10:33:36 AM Version By: Gretta Cool, BSN, RN, CWS, Kim RN, BSN Entered By: Gretta Cool, BSN, RN, CWS, Kim on 02/06/2022 11:09:04 -------------------------------------------------------------------------------- Vitals Details Patient Name: Date of Service: Kathryn [name], DEA NNA L. 02/06/2022 9:45 A M Medical Record Number: [id number] Patient Account Number: [id number] Date of Birth/Sex: Treating RN: [birth year] (88 y.o. Kathryn [name] Primary Care Adams Hinch: Rory Percy Other Clinician: Referring Isak Sotomayor: Treating  Madora Barletta/Extender: Lonell Face in Treatment: 0 Vital Signs Time Taken: 09:51 Temperature (F): 97.9 Height (in): 64 Pulse (bpm): 71 Weight (lbs): 178 Respiratory Rate (breaths/min): 18 Hamm, Matylda L ([id number]) [id number] Page 20 of 20 Body Mass Index (BMI): 30.6 Blood Pressure (mmHg): 106/66 Reference Range: 80 - 120 mg / dl Electronic Signature(s) Signed: 02/06/2022 5:24:20 PM By: Gretta Cool, BSN, RN, CWS, Kim RN, BSN Entered By: Gretta Cool, BSN, RN, CWS, Kim on 02/06/2022 09:52:11

## 2022-02-07 NOTE — Progress Notes (Signed)
ZAMERIA, VOGL ([id number]) [id number] Page 1 of 17 Visit Report for 02/06/2022 Chief Complaint Document Details Patient Name: Date of Service: Kathryn [name], Kathryn [name] 02/06/2022 9:45 A M Medical Record Number: [id number] Patient Account Number: [id number] Date of Birth/Sex: Treating [name]: Feb 13, 1934 (88 y.o. Kathryn [name] Primary Care Provider: Rory [name] Other Clinician: Referring Provider: Treating Provider/Extender: Kathryn [name] in Treatment: 0 Information Obtained from: Patient Chief Complaint Bilateral foot ulcers and right LE Ulcer Electronic Signature(s) Signed: 02/06/2022 10:45:37 AM By: Kathryn Keeler PA-C Entered By: Kathryn [name] on 02/06/2022 10:45:37 -------------------------------------------------------------------------------- Debridement Details Patient Name: Date of Service: Kathryn [name], Kathryn [name] 02/06/2022 9:45 A M Medical Record Number: [id number] Patient Account Number: [id number] Date of Birth/Sex: Treating [name]: 01-Jul-1933 (88 y.o. Kathryn [name] Primary Care Provider: Rory [name] Other Clinician: Referring Provider: Treating Provider/Extender: Kathryn [name] in Treatment: 0 Debridement Performed for Assessment: Wound #1 Left T Fourth oe Performed By: Physician Kathryn [name], PA-C Debridement Type: Debridement Severity of Tissue Pre Debridement: Fat layer exposed Level of Consciousness (Pre-procedure): Awake and Alert Pre-procedure Verification/Time Out Yes - 10:54 Taken: T Area Debrided ([name] x W): otal 0.9 (cm) x 0.7 (cm) = 0.63 (cm) Tissue and other material debrided: Viable, Non-Viable, Slough, Subcutaneous, Biofilm, Slough Level: Skin/Subcutaneous Tissue Debridement Description: Excisional Instrument: Curette Bleeding: Minimum Hemostasis Achieved: Pressure Response to Treatment: Procedure was tolerated well Level of Consciousness (Post- Awake and Alert procedure): Post  Debridement Measurements of Total Wound Kathryn [name], Kathryn [name] ([id number]) [id number] Page 2 of 17 Length: (cm) 0.9 Width: (cm) 0.7 Depth: (cm) 0.2 Volume: (cm) 0.099 Character of Wound/Ulcer Post Debridement: Stable Severity of Tissue Post Debridement: Fat layer exposed Post Procedure Diagnosis Same as Pre-procedure Electronic Signature(s) Signed: 02/06/2022 5:24:20 PM By: Kathryn [name] Signed: 02/07/2022 2:43:36 PM By: Kathryn Keeler PA-C Entered By: Kathryn [name], BSN, [name], CWS, Kathryn on 02/06/2022 10:54:42 -------------------------------------------------------------------------------- Debridement Details Patient Name: Date of Service: Kathryn [name], Kathryn [name] 02/06/2022 9:45 A M Medical Record Number: [id number] Patient Account Number: [id number] Date of Birth/Sex: Treating [name]: 1933-04-27 (88 y.o. Kathryn [name], Kathryn Primary Care Provider: Rory [name] Other Clinician: Referring Provider: Treating Provider/Extender: Kathryn [name] in Treatment: 0 Debridement Performed for Assessment: Wound #2 Left,Lateral Foot Performed By: Physician Kathryn [name], PA-C Debridement Type: Debridement Severity of Tissue Pre Debridement: Fat layer exposed Level of Consciousness (Pre-procedure): Awake and Alert Pre-procedure Verification/Time Out Yes - 10:54 Taken: T Area Debrided ([name] x W): otal 1 (cm) x 0.5 (cm) = 0.5 (cm) Tissue and other material debrided: Viable, Non-Viable, Slough, Subcutaneous, Biofilm, Slough Level: Skin/Subcutaneous Tissue Debridement Description: Excisional Instrument: Curette Bleeding: Minimum Hemostasis Achieved: Pressure Response to Treatment: Procedure was tolerated well Level of Consciousness (Post- Awake and Alert procedure): Post Debridement Measurements of Total Wound Length: (cm) 1 Width: (cm) 0.5 Depth: (cm) 0.2 Volume: (cm) 0.079 Character of Wound/Ulcer Post Debridement: Stable Severity of Tissue Post  Debridement: Fat layer exposed Post Procedure Diagnosis Same as Pre-procedure Electronic Signature(s) Signed: 02/06/2022 5:24:20 PM By: Kathryn [name] Signed: 02/07/2022 2:43:36 PM By: Kathryn Keeler PA-C Entered By: Kathryn [name], BSN, [name], CWS, Kathryn on 02/06/2022 10:55:17 Kathryn [name] ([id number]) [id number] Page 3 of 17 -------------------------------------------------------------------------------- Debridement Details Patient Name: Date of Service: Kathryn [name], Kathryn [name] 02/06/2022 9:45 A M Medical Record Number: [id number] Patient Account Number: [id number] Date of Birth/Sex: Treating [name]: 1934-03-18 (88 y.o. Kathryn [name] Primary  Care Provider: Rory [name] Other Clinician: Referring Provider: Treating Provider/Extender: Kathryn [name] in Treatment: 0 Debridement Performed for Assessment: Wound #3 Right,Lateral Lower Leg Performed By: Physician Kathryn [name], PA-C Debridement Type: Debridement Severity of Tissue Pre Debridement: Fat layer exposed Level of Consciousness (Pre-procedure): Awake and Alert Pre-procedure Verification/Time Out Yes - 10:54 Taken: T Area Debrided ([name] x W): otal 1 (cm) x 0.8 (cm) = 0.8 (cm) Tissue and other material debrided: Viable, Non-Viable, Slough, Subcutaneous, Biofilm, Slough Level: Skin/Subcutaneous Tissue Debridement Description: Excisional Instrument: Curette Bleeding: Minimum Hemostasis Achieved: Pressure Response to Treatment: Procedure was tolerated well Level of Consciousness (Post- Awake and Alert procedure): Post Debridement Measurements of Total Wound Length: (cm) 1 Width: (cm) 0.8 Depth: (cm) 0.2 Volume: (cm) 0.126 Character of Wound/Ulcer Post Debridement: Stable Severity of Tissue Post Debridement: Fat layer exposed Post Procedure Diagnosis Same as Pre-procedure Electronic Signature(s) Signed: 02/06/2022 5:24:20 PM By: Kathryn [name] Signed:  02/07/2022 2:43:36 PM By: Kathryn Keeler PA-C Entered By: Kathryn [name], BSN, [name], CWS, Kathryn on 02/06/2022 10:56:17 -------------------------------------------------------------------------------- Debridement Details Patient Name: Date of Service: Kathryn [name], Kathryn [name] 02/06/2022 9:45 A M Medical Record Number: [id number] Patient Account Number: [id number] Date of Birth/Sex: Treating [name]: Sep 13, 1933 (88 y.o. Kathryn [name] Primary Care Provider: Rory [name] Other Clinician: Referring Provider: Treating Provider/Extender: Kathryn [name] in TreatmentJUSTINE, Kathryn [name] ([id number]) [id number] Page 4 of 17 Debridement Performed for Assessment: Wound #7 Right T Second oe Performed By: Physician Kathryn [name], PA-C Debridement Type: Debridement Severity of Tissue Pre Debridement: Fat layer exposed Level of Consciousness (Pre-procedure): Awake and Alert Pre-procedure Verification/Time Out Yes - 10:54 Taken: T Area Debrided ([name] x W): otal 0.6 (cm) x 0.5 (cm) = 0.3 (cm) Tissue and other material debrided: Viable, Non-Viable, Eschar, Slough, Slough Level: Non-Viable Tissue Debridement Description: Selective/Open Wound Instrument: Curette Bleeding: Minimum Hemostasis Achieved: Pressure Response to Treatment: Procedure was tolerated well Level of Consciousness (Post- Awake and Alert procedure): Post Debridement Measurements of Total Wound Length: (cm) 0.5 Width: (cm) 0.5 Depth: (cm) 0.2 Volume: (cm) 0.039 Character of Wound/Ulcer Post Debridement: Stable Severity of Tissue Post Debridement: Fat layer exposed Post Procedure Diagnosis Same as Pre-procedure Electronic Signature(s) Signed: 02/06/2022 5:24:20 PM By: Kathryn [name] Signed: 02/07/2022 2:43:36 PM By: Kathryn Keeler PA-C Entered By: Kathryn [name], BSN, [name], CWS, Kathryn on 02/06/2022  10:57:08 -------------------------------------------------------------------------------- Debridement Details Patient Name: Date of Service: Kathryn [name], Kathryn [name] 02/06/2022 9:45 A M Medical Record Number: [id number] Patient Account Number: [id number] Date of Birth/Sex: Treating [name]: 02/09/1934 (88 y.o. Kathryn [name] Primary Care Provider: Rory [name] Other Clinician: Referring Provider: Treating Provider/Extender: Kathryn [name] in Treatment: 0 Debridement Performed for Assessment: Wound #5 Right,Distal,Posterior Lower Leg Performed By: Physician Kathryn [name], PA-C Debridement Type: Debridement Severity of Tissue Pre Debridement: Fat layer exposed Level of Consciousness (Pre-procedure): Awake and Alert Pre-procedure Verification/Time Out Yes - 10:54 Taken: T Area Debrided ([name] x W): otal 1 (cm) x 0.6 (cm) = 0.6 (cm) Tissue and other material debrided: Viable, Non-Viable, Slough, Subcutaneous, Biofilm, Slough Level: Skin/Subcutaneous Tissue Debridement Description: Excisional Instrument: Curette Bleeding: Minimum Hemostasis Achieved: Pressure Response to Treatment: Procedure was tolerated well Level of Consciousness (Post- Awake and Alert procedure): Post Debridement Measurements of Total Wound Bohannon, Kathryn [name] ([id number]) [id number] Page 5 of 17 Length: (cm) 1 Width: (cm) 0.6 Depth: (cm) 0.3 Volume: (cm) 0.141 Character of Wound/Ulcer Post Debridement: Stable  Severity of Tissue Post Debridement: Fat layer exposed Post Procedure Diagnosis Same as Pre-procedure Electronic Signature(s) Signed: 02/06/2022 5:24:20 PM By: Kathryn [name] Signed: 02/07/2022 2:43:36 PM By: Kathryn Keeler PA-C Entered By: Kathryn [name], BSN, [name], CWS, Kathryn on 02/06/2022 10:59:28 -------------------------------------------------------------------------------- Debridement Details Patient Name: Date of Service: Kathryn [name], Kathryn [name]  02/06/2022 9:45 A M Medical Record Number: [id number] Patient Account Number: [id number] Date of Birth/Sex: Treating [name]: Apr 21, 1933 (88 y.o. Kathryn [name], Kathryn Primary Care Provider: Rory [name] Other Clinician: Referring Provider: Treating Provider/Extender: Kathryn [name] in Treatment: 0 Debridement Performed for Assessment: Wound #6 Right,Proximal,Posterior Lower Leg Performed By: Physician Kathryn [name], PA-C Debridement Type: Debridement Severity of Tissue Pre Debridement: Fat layer exposed Level of Consciousness (Pre-procedure): Awake and Alert Pre-procedure Verification/Time Out Yes - 10:54 Taken: T Area Debrided ([name] x W): otal 0.4 (cm) x 0.3 (cm) = 0.12 (cm) Tissue and other material debrided: Viable, Non-Viable, Eschar, Slough, Subcutaneous, Slough Level: Skin/Subcutaneous Tissue Debridement Description: Excisional Instrument: Curette Bleeding: Minimum Hemostasis Achieved: Pressure Response to Treatment: Procedure was tolerated well Level of Consciousness (Post- Awake and Alert procedure): Post Debridement Measurements of Total Wound Length: (cm) 0.4 Width: (cm) 0.3 Depth: (cm) 0.1 Volume: (cm) 0.009 Character of Wound/Ulcer Post Debridement: Stable Severity of Tissue Post Debridement: Fat layer exposed Post Procedure Diagnosis Same as Pre-procedure Electronic Signature(s) Signed: 02/06/2022 5:24:20 PM By: Kathryn [name] Signed: 02/07/2022 2:43:36 PM By: Kathryn Keeler PA-C Entered By: Kathryn [name], BSN, [name], CWS, Kathryn on 02/06/2022 11:00:10 Kathryn [name] ([id number]) [id number] Page 6 of 17 -------------------------------------------------------------------------------- Debridement Details Patient Name: Date of Service: Kathryn [name], Kathryn [name] 02/06/2022 9:45 A M Medical Record Number: [id number] Patient Account Number: [id number] Date of Birth/Sex: Treating [name]: [birth year] (88 y.o. Kathryn [name], Kathryn Primary  Care Provider: Rory [name] Other Clinician: Referring Provider: Treating Provider/Extender: Kathryn [name] in Treatment: 0 Debridement Performed for Assessment: Wound #4 Right,Proximal Lower Leg Performed By: Physician Kathryn [name], PA-C Debridement Type: Debridement Severity of Tissue Pre Debridement: Fat layer exposed Level of Consciousness (Pre-procedure): Awake and Alert Pre-procedure Verification/Time Out Yes - 10:54 Taken: T Area Debrided ([name] x W): otal 0.9 (cm) x 0.5 (cm) = 0.45 (cm) Tissue and other material debrided: Non-Viable, Eschar, Slough, Slough Level: Non-Viable Tissue Debridement Description: Selective/Open Wound Instrument: Curette Bleeding: Minimum Hemostasis Achieved: Pressure Response to Treatment: Procedure was tolerated well Level of Consciousness (Post- Awake and Alert procedure): Post Debridement Measurements of Total Wound Length: (cm) 0.9 Width: (cm) 0.5 Depth: (cm) 0.2 Volume: (cm) 0.071 Character of Wound/Ulcer Post Debridement: Stable Severity of Tissue Post Debridement: Fat layer exposed Post Procedure Diagnosis Same as Pre-procedure Electronic Signature(s) Signed: 02/06/2022 5:24:20 PM By: Kathryn [name] Signed: 02/07/2022 2:43:36 PM By: Kathryn Keeler PA-C Entered By: Kathryn [name], BSN, [name], CWS, Kathryn on 02/06/2022 11:01:02 -------------------------------------------------------------------------------- HPI Details Patient Name: Date of Service: Kathryn [name], Kathryn [name] 02/06/2022 9:45 A M Medical Record Number: [id number] Patient Account Number: [id number] Date of Birth/Sex: Treating [name]: Oct 02, 1933 (88 y.o. Kathryn [name] Primary Care Provider: Rory [name] Other Clinician: Referring Provider: Treating Provider/Extender: Kathryn [name] in TreatmentKYNSLI, Kathryn [name] ([id number]) [id number] Page 7 of 17 History of Present Illness HPI Description:  02-06-2022 upon evaluation today patient presents for initial inspection here in our clinic concerning wounds that she has over the bilateral feet as well as the right leg. She has  been tolerating the dressing changes in general here without complication she has been putting antibiotic ointment on this. With that being said I do believe that she would benefit from the use of something like Xeroform gauze dressings which would likely be a bit better. Fortunately I do not see any signs of infection which is good news do not think she needs any antibiotics. She tells me that these wounds typically occur as a result of her bumping or hitting her leg and then they slowly progressed from there. Patient does have a history of diabetes for which her hemoglobin A1c was 6.9 measured on 12-12-2021. She also has a history of arterial insufficiency with a right ABI of 0.66 with a TBI of 0.61 and a left ABI of 0.82 with a TBI of 0.45. She has a follow-up evaluation on 02-27-2022 with vascular. This testing was done on 01-28-2022. Subsequently the patient also has a history of hypertension, generalized weakness, congestive heart failure, and lower extremity edema. Electronic Signature(s) Signed: 02/06/2022 5:50:28 PM By: Kathryn Keeler PA-C Entered By: Kathryn [name] on 02/06/2022 17:50:28 -------------------------------------------------------------------------------- Physical Exam Details Patient Name: Date of Service: Kathryn [name], Kathryn [name] 02/06/2022 9:45 A M Medical Record Number: [id number] Patient Account Number: [id number] Date of Birth/Sex: Treating [name]: 01-17-1934 (88 y.o. Kathryn [name] Primary Care Provider: Rory [name] Other Clinician: Referring Provider: Treating Provider/Extender: Kathryn [name] in Treatment: 0 Constitutional sitting or standing blood pressure is within target range for patient.. pulse regular and within target range for patient.Marland Kitchen respirations regular,  non-labored and within target range for patient.Marland Kitchen temperature within target range for patient.. Well-nourished and well-hydrated in no acute distress. Eyes conjunctiva clear no eyelid edema noted. pupils equal round and reactive to light and accommodation. Ears, Nose, Mouth, and Throat no gross abnormality of ear auricles or external auditory canals. normal hearing noted during conversation. mucus membranes moist. Respiratory normal breathing without difficulty. Cardiovascular 2+ dorsalis pedis/posterior tibialis pulses. 1+ pitting edema of the bilateral lower extremities. Musculoskeletal normal gait and posture. no significant deformity or arthritic changes, no loss or range of motion, no clubbing. Psychiatric this patient is able to make decisions and demonstrates good insight into disease process. Alert and Oriented x 3. pleasant and cooperative. Notes Upon inspection patient's wound bed actually showed signs of somewhat superficial ulcerations over the bilateral feet, and right leg. Subsequently I did perform debridement at multiple locations in order to clearway some of the necrotic debris. She tolerated the debridements at all sites without complication and in general I was able to improve the overall appearance of the wounds for the most part across the board. I am very pleased in that regard. I do believe that she is going to require likely subsequent debridements going [name] but at the same time I do believe that we need to monitor closely for any signs of infection and again in the interim I am hopeful that the Xeroform gauze is can help some of these areas to heal up quite readily and quickly. Electronic Signature(s) Signed: 02/06/2022 5:51:35 PM By: Kathryn Keeler PA-C Entered By: Kathryn [name] on 02/06/2022 17:51:35 Kathryn [name] ([id number]) [id number] Page 8 of  17 -------------------------------------------------------------------------------- Physician Orders Details Patient Name: Date of Service: Kathryn [name], Kathryn [name] 02/06/2022 9:45 A M Medical Record Number: [id number] Patient Account Number: [id number] Date of Birth/Sex: Treating [name]: 08-19-1933 (88 y.o. Kathryn [name] Primary Care Provider: Rory [name] Other Clinician: Referring Provider: Treating Provider/Extender: Jeri Cos  Patriciaann Clan in Treatment: 0 Verbal / Phone Orders: No Diagnosis Coding ICD-10 Coding Code Description E11.621 Type 2 diabetes mellitus with foot ulcer I73.89 Other specified peripheral vascular diseases L97.522 Non-pressure chronic ulcer of other part of left foot with fat layer exposed L97.512 Non-pressure chronic ulcer of other part of right foot with fat layer exposed L97.812 Non-pressure chronic ulcer of other part of right lower leg with fat layer exposed M62.81 Muscle weakness (generalized) I50.42 Chronic combined systolic (congestive) and diastolic (congestive) heart failure I10 Essential (primary) hypertension Follow-up Appointments Return Appointment in 1 week. Bathing/ Shower/ Hygiene May shower; gently cleanse wound with antibacterial soap, rinse and pat dry prior to dressing wounds Anesthetic (Use 'Patient Medications' Section for Anesthetic Order Entry) Lidocaine applied to wound bed - In clinic only Edema Control - Lymphedema / Segmental Compressive Device / Other Elevate, Exercise Daily and A void Standing for Long Periods of Time. Elevate legs to the level of the heart and pump ankles as often as possible Elevate leg(s) parallel to the floor when sitting. DO YOUR BEST to sleep in the bed at night. DO NOT sleep in your recliner. Long hours of sitting in a recliner leads to swelling of the legs and/or potential wounds on your backside. Wound Treatment Wound #1 - T Fourth oe Wound Laterality: Left Cleanser: Soap and Water 1 x Per  Day/15 Days Discharge Instructions: Gently cleanse wound with antibacterial soap, rinse and pat dry prior to dressing wounds Prim Dressing: Xeroform-HBD 2x2 (in/in) (DME) (Generic) 1 x Per Day/15 Days ary Discharge Instructions: Apply Xeroform-HBD 2x2 (in/in) as directed Secondary Dressing: Coverlet Latex-Free Fabric Adhesive Dressings (DME) (Generic) 1 x Per Day/15 Days Discharge Instructions: 1.5 x 2 Wound #2 - Foot Wound Laterality: Left, Lateral Cleanser: Byram Ancillary Kit - 15 Day Supply 1 x Per Day/15 Days Discharge Instructions: Use supplies as instructed; Kit contains: (15) Saline Bullets; (15) 3x3 Gauze; 15 pr Gloves Cleanser: Soap and Water 1 x Per Day/15 Days Discharge Instructions: Gently cleanse wound with antibacterial soap, rinse and pat dry prior to dressing wounds Prim Dressing: Xeroform-HBD 2x2 (in/in) (DME) (Generic) 1 x Per Day/15 Days ary Discharge Instructions: Apply Xeroform-HBD 2x2 (in/in) as directed Secondary Dressing: Coverlet Latex-Free Fabric Adhesive Dressings (DME) (Generic) 1 x Per Day/15 Days Discharge Instructions: 1.5 x 2 Kathryn [name], Kathryn [name] ([id number]) [id number] Page 9 of 17 Wound #3 - Lower Leg Wound Laterality: Right, Lateral Prim Dressing: Xeroform-HBD 2x2 (in/in) (DME) (Generic) 1 x Per Day/15 Days ary Discharge Instructions: Apply Xeroform-HBD 2x2 (in/in) as directed Secondary Dressing: Conforming Guaze Roll-Medium (DME) (Generic) 1 x Per Day/15 Days Discharge Instructions: Apply Conforming Stretch Guaze Bandage as directed Secured With: Medipore T - 750M Medipore H Soft Cloth Surgical T ape ape, 2x2 (in/yd) (DME) (Generic) 1 x Per Day/15 Days Wound #4 - Lower Leg Wound Laterality: Right, Proximal Prim Dressing: Xeroform-HBD 2x2 (in/in) (DME) (Generic) 1 x Per Day/15 Days ary Discharge Instructions: Apply Xeroform-HBD 2x2 (in/in) as directed Secondary Dressing: Conforming Guaze Roll-Medium (DME) (Generic) 1 x Per Day/15  Days Discharge Instructions: Apply Conforming Stretch Guaze Bandage as directed Secured With: Medipore T - 750M Medipore H Soft Cloth Surgical T ape ape, 2x2 (in/yd) (DME) (Generic) 1 x Per Day/15 Days Wound #5 - Lower Leg Wound Laterality: Right, Posterior, Distal Prim Dressing: Xeroform-HBD 2x2 (in/in) (DME) (Generic) 1 x Per Day/15 Days ary Discharge Instructions: Apply Xeroform-HBD 2x2 (in/in) as directed Secondary Dressing: Conforming Guaze Roll-Medium (DME) (Generic) 1 x Per Day/15 Days Discharge Instructions: Apply  Conforming Stretch Guaze Bandage as directed Secured With: Medipore T - 50M Medipore H Soft Cloth Surgical T ape ape, 2x2 (in/yd) (DME) (Generic) 1 x Per Day/15 Days Wound #6 - Lower Leg Wound Laterality: Right, Posterior, Proximal Prim Dressing: Xeroform-HBD 2x2 (in/in) (DME) (Generic) 1 x Per Day/15 Days ary Discharge Instructions: Apply Xeroform-HBD 2x2 (in/in) as directed Secondary Dressing: Conforming Guaze Roll-Medium (DME) (Generic) 1 x Per Day/15 Days Discharge Instructions: Apply Conforming Stretch Guaze Bandage as directed Secured With: Medipore T - 50M Medipore H Soft Cloth Surgical T ape ape, 2x2 (in/yd) (DME) (Generic) 1 x Per Day/15 Days Wound #7 - T Second oe Wound Laterality: Right Cleanser: Soap and Water 1 x Per Day/15 Days Discharge Instructions: Gently cleanse wound with antibacterial soap, rinse and pat dry prior to dressing wounds Prim Dressing: Xeroform-HBD 2x2 (in/in) (DME) (Generic) 1 x Per Day/15 Days ary Discharge Instructions: Apply Xeroform-HBD 2x2 (in/in) as directed Secondary Dressing: Coverlet Latex-Free Fabric Adhesive Dressings (DME) (Generic) 1 x Per Day/15 Days Discharge Instructions: 1.5 x 2 Electronic Signature(s) Signed: 02/06/2022 5:24:20 PM By: Kathryn [name] Signed: 02/07/2022 2:43:36 PM By: Kathryn Keeler PA-C Entered By: Kathryn [name], BSN, [name], CWS, Kathryn on 02/06/2022  11:41:24 -------------------------------------------------------------------------------- Problem List Details Patient Name: Date of Service: Kathryn [name], Kathryn [name] 02/06/2022 9:45 A M Medical Record Number: [id number] Patient Account Number: [id number] Date of Birth/Sex: Treating [name]: 11/11/1933 (88 y.o. Kathryn [name] Primary Care Provider: Rory [name] Other Clinician: Thom [name] ([id number]) [id number] Page 10 of 17 Referring Provider: Treating Provider/Extender: Kathryn [name] in Treatment: 0 Active Problems ICD-10 Encounter Code Description Active Date MDM Diagnosis E11.621 Type 2 diabetes mellitus with foot ulcer 02/06/2022 No Yes I73.89 Other specified peripheral vascular diseases 02/06/2022 No Yes L97.522 Non-pressure chronic ulcer of other part of left foot with fat layer exposed 02/06/2022 No Yes L97.512 Non-pressure chronic ulcer of other part of right foot with fat layer exposed 02/06/2022 No Yes L97.812 Non-pressure chronic ulcer of other part of right lower leg with fat layer 02/06/2022 No Yes exposed M62.81 Muscle weakness (generalized) 02/06/2022 No Yes I50.42 Chronic combined systolic (congestive) and diastolic (congestive) heart failure 02/06/2022 No Yes I10 Essential (primary) hypertension 02/06/2022 No Yes Inactive Problems Resolved Problems Electronic Signature(s) Signed: 02/06/2022 10:44:25 AM By: Kathryn Keeler PA-C Entered By: Kathryn [name] on 02/06/2022 10:44:24 -------------------------------------------------------------------------------- Progress Note Details Patient Name: Date of Service: Kathryn [name], Kathryn [name] 02/06/2022 9:45 A M Medical Record Number: [id number] Patient Account Number: [id number] Date of Birth/Sex: Treating [name]: Apr 02, 1933 (88 y.o. Kathryn [name] Primary Care Provider: Rory [name] Other Clinician: Referring Provider: Treating Provider/Extender: Kathryn [name] in Treatment: 0 Subjective Kathryn [name], Kathryn [name] ([id number]) [id number] Page 11 of 17 Chief Complaint Information obtained from Patient Bilateral foot ulcers and right LE Ulcer History of Present Illness (HPI) 02-06-2022 upon evaluation today patient presents for initial inspection here in our clinic concerning wounds that she has over the bilateral feet as well as the right leg. She has been tolerating the dressing changes in general here without complication she has been putting antibiotic ointment on this. With that being said I do believe that she would benefit from the use of something like Xeroform gauze dressings which would likely be a bit better. Fortunately I do not see any signs of infection which is good news do not think she needs any antibiotics. She tells me that these wounds typically occur as a result  of her bumping or hitting her leg and then they slowly progressed from there. Patient does have a history of diabetes for which her hemoglobin A1c was 6.9 measured on 12-12-2021. She also has a history of arterial insufficiency with a right ABI of 0.66 with a TBI of 0.61 and a left ABI of 0.82 with a TBI of 0.45. She has a follow-up evaluation on 02-27-2022 with vascular. This testing was done on 01-28-2022. Subsequently the patient also has a history of hypertension, generalized weakness, congestive heart failure, and lower extremity edema. Patient History Information obtained from Patient. Allergies No Known Drug Allergies Social History Former smoker, Marital Status - Widowed, Alcohol Use - Never, Caffeine Use - Daily. Medical History Respiratory Patient has history of Chronic Obstructive Pulmonary Disease (COPD) Cardiovascular Patient has history of Congestive Heart Failure, Coronary Artery Disease, Hypertension, Peripheral Arterial Disease, Peripheral Venous Disease Endocrine Patient has history of Type II  Diabetes Musculoskeletal Patient has history of Rheumatoid Arthritis Neurologic Patient has history of Neuropathy Patient is treated with Oral Agents. Blood sugar is not tested. Medical A Surgical History Notes nd Oncologic SKkin Cancers Review of Systems (ROS) Ear/Nose/Mouth/Throat Denies complaints or symptoms of Difficult clearing ears, Sinusitis. Hematologic/Lymphatic Denies complaints or symptoms of Bleeding / Clotting Disorders, Human Immunodeficiency Virus. Respiratory Complains or has symptoms of Shortness of Breath. Cardiovascular Complains or has symptoms of LE edema. Gastrointestinal Denies complaints or symptoms of Frequent diarrhea, Nausea, Vomiting. Endocrine Complains or has symptoms of Thyroid disease. Genitourinary Denies complaints or symptoms of Kidney failure/ Dialysis, Incontinence/dribbling. Immunological Denies complaints or symptoms of Hives, Itching. Integumentary (Skin) Complains or has symptoms of Wounds, Bleeding or bruising tendency. Objective Constitutional sitting or standing blood pressure is within target range for patient.. pulse regular and within target range for patient.Marland Kitchen respirations regular, non-labored and within target range for patient.Marland Kitchen temperature within target range for patient.. Well-nourished and well-hydrated in no acute distress. Vitals Time Taken: 9:51 AM, Height: 64 in, Weight: 178 lbs, BMI: 30.6, Temperature: 97.9 F, Pulse: 71 bpm, Respiratory Rate: 18 breaths/min, Blood Pressure: 106/66 mmHg. Eyes conjunctiva clear no eyelid edema noted. pupils equal round and reactive to light and accommodation. Ears, Nose, Mouth, and Throat no gross abnormality of ear auricles or external auditory canals. normal hearing noted during conversation. mucus membranes moist. Respiratory Char, Wiley [name] ([id number]) [id number] Page 12 of 17 normal breathing without difficulty. Cardiovascular 2+ dorsalis  pedis/posterior tibialis pulses. 1+ pitting edema of the bilateral lower extremities. Musculoskeletal normal gait and posture. no significant deformity or arthritic changes, no loss or range of motion, no clubbing. Psychiatric this patient is able to make decisions and demonstrates good insight into disease process. Alert and Oriented x 3. pleasant and cooperative. General Notes: Upon inspection patient's wound bed actually showed signs of somewhat superficial ulcerations over the bilateral feet, and right leg. Subsequently I did perform debridement at multiple locations in order to clearway some of the necrotic debris. She tolerated the debridements at all sites without complication and in general I was able to improve the overall appearance of the wounds for the most part across the board. I am very pleased in that regard. I do believe that she is going to require likely subsequent debridements going [name] but at the same time I do believe that we need to monitor closely for any signs of infection and again in the interim I am hopeful that the Xeroform gauze is can help some of these areas to heal up quite readily and quickly. Integumentary (  Hair, Skin) Wound #1 status is Open. Original cause of wound was Trauma. The date acquired was: 01/22/2022. The wound is located on the Left T Fourth. The wound oe measures 0.9cm length x 0.7cm width x 0.1cm depth; 0.495cm^2 area and 0.049cm^3 volume. There is Fat Layer (Subcutaneous Tissue) exposed. There is a medium amount of serous drainage noted. There is no granulation within the wound bed. There is a large (67-100%) amount of necrotic tissue within the wound bed including Adherent Slough. Wound #2 status is Open. Original cause of wound was Gradually Appeared. The date acquired was: 01/22/2022. The wound is located on the Left,Lateral Foot. The wound measures 1cm length x 0.5cm width x 0.1cm depth; 0.393cm^2 area and 0.039cm^3 volume. There is Fat  Layer (Subcutaneous Tissue) exposed. There is a medium amount of serous drainage noted. There is no granulation within the wound bed. There is a large (67-100%) amount of necrotic tissue within the wound bed including Adherent Slough. Wound #3 status is Open. Original cause of wound was Gradually Appeared. The date acquired was: 01/22/2022. The wound is located on the Right,Lateral Lower Leg. The wound measures 1cm length x 0.8cm width x 0.1cm depth; 0.628cm^2 area and 0.063cm^3 volume. There is Fat Layer (Subcutaneous Tissue) exposed. There is a medium amount of serous drainage noted. There is no granulation within the wound bed. There is a large (67-100%) amount of necrotic tissue within the wound bed including Eschar. Wound #4 status is Open. Original cause of wound was Gradually Appeared. The date acquired was: 01/15/2022. The wound is located on the Right,Proximal Lower Leg. The wound measures 0.9cm length x 0.5cm width x 0.1cm depth; 0.353cm^2 area and 0.035cm^3 volume. There is Fat Layer (Subcutaneous Tissue) exposed. There is a medium amount of serous drainage noted. There is small (1-33%) granulation within the wound bed. There is a large (67-100%) amount of necrotic tissue within the wound bed. Wound #5 status is Open. Original cause of wound was Gradually Appeared. The date acquired was: 01/15/2022. The wound is located on the Right,Distal,Posterior Lower Leg. The wound measures 1cm length x 0.6cm width x 0.2cm depth; 0.471cm^2 area and 0.094cm^3 volume. There is Fat Layer (Subcutaneous Tissue) exposed. There is a medium amount of serous drainage noted. There is no granulation within the wound bed. There is a large (67-100%) amount of necrotic tissue within the wound bed including Adherent Slough. Wound #6 status is Open. Original cause of wound was Gradually Appeared. The date acquired was: 12/18/2021. The wound is located on the Right,Proximal,Posterior Lower Leg. The wound measures 0.4cm  length x 0.3cm width x 0.1cm depth; 0.094cm^2 area and 0.009cm^3 volume. There is Fat Layer (Subcutaneous Tissue) exposed. There is a medium amount of serous drainage noted. There is no granulation within the wound bed. There is a large (67- 100%) amount of necrotic tissue within the wound bed including Adherent Slough. Wound #7 status is Open. Original cause of wound was Gradually Appeared. The date acquired was: 01/15/2022. The wound is located on the Right T Second. oe The wound measures 0.5cm length x 0.5cm width x 0.2cm depth; 0.196cm^2 area and 0.039cm^3 volume. There is Fat Layer (Subcutaneous Tissue) exposed. There is a medium amount of serous drainage noted. There is no granulation within the wound bed. There is a large (67-100%) amount of necrotic tissue within the wound bed including Adherent Slough. Assessment Active Problems ICD-10 Type 2 diabetes mellitus with foot ulcer Other specified peripheral vascular diseases Non-pressure chronic ulcer of other part of left  foot with fat layer exposed Non-pressure chronic ulcer of other part of right foot with fat layer exposed Non-pressure chronic ulcer of other part of right lower leg with fat layer exposed Muscle weakness (generalized) Chronic combined systolic (congestive) and diastolic (congestive) heart failure Essential (primary) hypertension Procedures Wound #1 Pre-procedure diagnosis of Wound #1 is a Diabetic Wound/Ulcer of the Lower Extremity located on the Left T Fourth .Severity of Tissue Pre Debridement is: oe Fat layer exposed. There was a Excisional Skin/Subcutaneous Tissue Debridement with a total area of 0.63 sq cm performed by Kathryn [name], PA-C. With the following instrument(s): Curette to remove Viable and Non-Viable tissue/material. Material removed includes Subcutaneous Tissue, Slough, and Biofilm. No specimens were taken. A time out was conducted at 10:54, prior to the start of the procedure. A Minimum amount of  bleeding was controlled with Pressure. The procedure was tolerated well. Post Debridement Measurements: 0.9cm length x 0.7cm width x 0.2cm depth; 0.099cm^3 volume. Character of Wound/Ulcer Post Debridement is stable. Severity of Tissue Post Debridement is: Fat layer exposed. Post procedure Diagnosis Wound #1: Same as Pre-Procedure Wound #2 Pre-procedure diagnosis of Wound #2 is a Diabetic Wound/Ulcer of the Lower Extremity located on the Left,Lateral Foot .Severity of Tissue Pre Debridement NAKARI, BRACKNELL ([id number]) [id number] Page 13 of 17 is: Fat layer exposed. There was a Excisional Skin/Subcutaneous Tissue Debridement with a total area of 0.5 sq cm performed by Kathryn [name], PA-C. With the following instrument(s): Curette to remove Viable and Non-Viable tissue/material. Material removed includes Subcutaneous Tissue, Slough, and Biofilm. No specimens were taken. A time out was conducted at 10:54, prior to the start of the procedure. A Minimum amount of bleeding was controlled with Pressure. The procedure was tolerated well. Post Debridement Measurements: 1cm length x 0.5cm width x 0.2cm depth; 0.079cm^3 volume. Character of Wound/Ulcer Post Debridement is stable. Severity of Tissue Post Debridement is: Fat layer exposed. Post procedure Diagnosis Wound #2: Same as Pre-Procedure Wound #3 Pre-procedure diagnosis of Wound #3 is a Diabetic Wound/Ulcer of the Lower Extremity located on the Right,Lateral Lower Leg .Severity of Tissue Pre Debridement is: Fat layer exposed. There was a Excisional Skin/Subcutaneous Tissue Debridement with a total area of 0.8 sq cm performed by Kathryn [name], PA-C. With the following instrument(s): Curette to remove Viable and Non-Viable tissue/material. Material removed includes Subcutaneous Tissue, Slough, and Biofilm. No specimens were taken. A time out was conducted at 10:54, prior to the start of the procedure. A Minimum amount of  bleeding was controlled with Pressure. The procedure was tolerated well. Post Debridement Measurements: 1cm length x 0.8cm width x 0.2cm depth; 0.126cm^3 volume. Character of Wound/Ulcer Post Debridement is stable. Severity of Tissue Post Debridement is: Fat layer exposed. Post procedure Diagnosis Wound #3: Same as Pre-Procedure Wound #4 Pre-procedure diagnosis of Wound #4 is a Diabetic Wound/Ulcer of the Lower Extremity located on the Right,Proximal Lower Leg .Severity of Tissue Pre Debridement is: Fat layer exposed. There was a Selective/Open Wound Non-Viable Tissue Debridement with a total area of 0.45 sq cm performed by Kathryn [name], PA-C. With the following instrument(s): Curette to remove Non-Viable tissue/material. Material removed includes Eschar and Slough and. No specimens were taken. A time out was conducted at 10:54, prior to the start of the procedure. A Minimum amount of bleeding was controlled with Pressure. The procedure was tolerated well. Post Debridement Measurements: 0.9cm length x 0.5cm width x 0.2cm depth; 0.071cm^3 volume. Character of Wound/Ulcer Post Debridement is stable. Severity of Tissue Post  Debridement is: Fat layer exposed. Post procedure Diagnosis Wound #4: Same as Pre-Procedure Wound #5 Pre-procedure diagnosis of Wound #5 is a Diabetic Wound/Ulcer of the Lower Extremity located on the Right,Distal,Posterior Lower Leg .Severity of Tissue Pre Debridement is: Fat layer exposed. There was a Excisional Skin/Subcutaneous Tissue Debridement with a total area of 0.6 sq cm performed by Kathryn [name], PA-C. With the following instrument(s): Curette to remove Viable and Non-Viable tissue/material. Material removed includes Subcutaneous Tissue, Slough, and Biofilm. No specimens were taken. A time out was conducted at 10:54, prior to the start of the procedure. A Minimum amount of bleeding was controlled with Pressure. The procedure was tolerated well. Post Debridement  Measurements: 1cm length x 0.6cm width x 0.3cm depth; 0.141cm^3 volume. Character of Wound/Ulcer Post Debridement is stable. Severity of Tissue Post Debridement is: Fat layer exposed. Post procedure Diagnosis Wound #5: Same as Pre-Procedure Wound #6 Pre-procedure diagnosis of Wound #6 is a Diabetic Wound/Ulcer of the Lower Extremity located on the Right,Proximal,Posterior Lower Leg .Severity of Tissue Pre Debridement is: Fat layer exposed. There was a Excisional Skin/Subcutaneous Tissue Debridement with a total area of 0.12 sq cm performed by Kathryn [name], PA-C. With the following instrument(s): Curette to remove Viable and Non-Viable tissue/material. Material removed includes Eschar, Subcutaneous Tissue, and Slough. No specimens were taken. A time out was conducted at 10:54, prior to the start of the procedure. A Minimum amount of bleeding was controlled with Pressure. The procedure was tolerated well. Post Debridement Measurements: 0.4cm length x 0.3cm width x 0.1cm depth; 0.009cm^3 volume. Character of Wound/Ulcer Post Debridement is stable. Severity of Tissue Post Debridement is: Fat layer exposed. Post procedure Diagnosis Wound #6: Same as Pre-Procedure Wound #7 Pre-procedure diagnosis of Wound #7 is a Diabetic Wound/Ulcer of the Lower Extremity located on the Right T Second .Severity of Tissue Pre Debridement oe is: Fat layer exposed. There was a Selective/Open Wound Non-Viable Tissue Debridement with a total area of 0.3 sq cm performed by Kathryn [name], PA-C. With the following instrument(s): Curette to remove Viable and Non-Viable tissue/material. Material removed includes Eschar and Slough and. No specimens were taken. A time out was conducted at 10:54, prior to the start of the procedure. A Minimum amount of bleeding was controlled with Pressure. The procedure was tolerated well. Post Debridement Measurements: 0.5cm length x 0.5cm width x 0.2cm depth; 0.039cm^3 volume. Character of  Wound/Ulcer Post Debridement is stable. Severity of Tissue Post Debridement is: Fat layer exposed. Post procedure Diagnosis Wound #7: Same as Pre-Procedure Plan Follow-up Appointments: Return Appointment in 1 week. Bathing/ Shower/ Hygiene: May shower; gently cleanse wound with antibacterial soap, rinse and pat dry prior to dressing wounds Anesthetic (Use 'Patient Medications' Section for Anesthetic Order Entry): Lidocaine applied to wound bed - In clinic only Edema Control - Lymphedema / Segmental Compressive Device / Other: Elevate, Exercise Daily and Avoid Standing for Long Periods of Time. Elevate legs to the level of the heart and pump ankles as often as possible Elevate leg(s) parallel to the floor when sitting. DO YOUR BEST to sleep in the bed at night. DO NOT sleep in your recliner. Long hours of sitting in a recliner leads to swelling of the legs and/or potential wounds on your backside. WOUND #1: - T Fourth Wound Laterality: Left oe Cleanser: Soap and Water 1 x Per Day/15 Days Discharge Instructions: Gently cleanse wound with antibacterial soap, rinse and pat dry prior to dressing wounds Prim Dressing: Xeroform-HBD 2x2 (in/in) (DME) (Generic) 1 x  Per Day/15 Days ary Discharge Instructions: Apply Xeroform-HBD 2x2 (in/in) as directed Secondary Dressing: Coverlet Latex-Free Fabric Adhesive Dressings (DME) (Generic) 1 x Per Day/15 Days Discharge Instructions: 1.5 x 2 WOUND #2: - Foot Wound Laterality: Left, Lateral Cleanser: [employer]ram Ancillary Kit - 15 Day Supply 1 x Per Day/15 Days Discharge Instructions: Use supplies as instructed; Kit contains: (15) Saline Bullets; (15) 3x3 Gauze; 15 pr Gloves Cleanser: Soap and Water 1 x Per Day/15 Days Discharge Instructions: Gently cleanse wound with antibacterial soap, rinse and pat dry prior to dressing wounds Prim Dressing: Xeroform-HBD 2x2 (in/in) (DME) (Generic) 1 x Per Day/15 Days ary Discharge Instructions: Apply Xeroform-HBD 2x2  (in/in) as directed Secondary Dressing: Coverlet Latex-Free Fabric Adhesive Dressings (DME) (Generic) 1 x Per Day/15 Days Kathryn [name], Kathryn [name] ([id number]) [id number] Page 14 of 17 Discharge Instructions: 1.5 x 2 WOUND #3: - Lower Leg Wound Laterality: Right, Lateral Prim Dressing: Xeroform-HBD 2x2 (in/in) (DME) (Generic) 1 x Per Day/15 Days ary Discharge Instructions: Apply Xeroform-HBD 2x2 (in/in) as directed Secondary Dressing: Conforming Guaze Roll-Medium (DME) (Generic) 1 x Per Day/15 Days Discharge Instructions: Apply Conforming Stretch Guaze Bandage as directed Secured With: Medipore T - 77M Medipore H Soft Cloth Surgical T ape ape, 2x2 (in/yd) (DME) (Generic) 1 x Per Day/15 Days WOUND #4: - Lower Leg Wound Laterality: Right, Proximal Prim Dressing: Xeroform-HBD 2x2 (in/in) (DME) (Generic) 1 x Per Day/15 Days ary Discharge Instructions: Apply Xeroform-HBD 2x2 (in/in) as directed Secondary Dressing: Conforming Guaze Roll-Medium (DME) (Generic) 1 x Per Day/15 Days Discharge Instructions: Apply Conforming Stretch Guaze Bandage as directed Secured With: Medipore T - 77M Medipore H Soft Cloth Surgical T ape ape, 2x2 (in/yd) (DME) (Generic) 1 x Per Day/15 Days WOUND #5: - Lower Leg Wound Laterality: Right, Posterior, Distal Prim Dressing: Xeroform-HBD 2x2 (in/in) (DME) (Generic) 1 x Per Day/15 Days ary Discharge Instructions: Apply Xeroform-HBD 2x2 (in/in) as directed Secondary Dressing: Conforming Guaze Roll-Medium (DME) (Generic) 1 x Per Day/15 Days Discharge Instructions: Apply Conforming Stretch Guaze Bandage as directed Secured With: Medipore T - 77M Medipore H Soft Cloth Surgical T ape ape, 2x2 (in/yd) (DME) (Generic) 1 x Per Day/15 Days WOUND #6: - Lower Leg Wound Laterality: Right, Posterior, Proximal Prim Dressing: Xeroform-HBD 2x2 (in/in) (DME) (Generic) 1 x Per Day/15 Days ary Discharge Instructions: Apply Xeroform-HBD 2x2 (in/in) as  directed Secondary Dressing: Conforming Guaze Roll-Medium (DME) (Generic) 1 x Per Day/15 Days Discharge Instructions: Apply Conforming Stretch Guaze Bandage as directed Secured With: Medipore T - 77M Medipore H Soft Cloth Surgical T ape ape, 2x2 (in/yd) (DME) (Generic) 1 x Per Day/15 Days WOUND #7: - T Second Wound Laterality: Right oe Cleanser: Soap and Water 1 x Per Day/15 Days Discharge Instructions: Gently cleanse wound with antibacterial soap, rinse and pat dry prior to dressing wounds Prim Dressing: Xeroform-HBD 2x2 (in/in) (DME) (Generic) 1 x Per Day/15 Days ary Discharge Instructions: Apply Xeroform-HBD 2x2 (in/in) as directed Secondary Dressing: Coverlet Latex-Free Fabric Adhesive Dressings (DME) (Generic) 1 x Per Day/15 Days Discharge Instructions: 1.5 x 2 1. I am going to suggest that we have the patient continue to monitor for any signs of worsening or infection. Obviously right now I think the Xeroform gauze dressings can be the best way to go. 2. I am also going to suggest that we have the patient continue with the daily dressing changes with Xeroform which is probably the best way to go at this point. 3. I am also going to suggest that we should have the patient continue with  elevation of her legs to help with any edema concerns. 4. I am also going to suggest that we may need to proceed with some x-rays in regard to her feet depending on how things progress. However if she heals readily and quickly then I do not think vascular be necessary if not then I think that would be one of the next steps. She is in agreement with that plan. We will see patient back for reevaluation in 1 week here in the clinic. If anything worsens or changes patient will contact our office for additional recommendations. Electronic Signature(s) Signed: 02/06/2022 5:52:49 PM By: Kathryn Keeler PA-C Entered By: Kathryn [name] on 02/06/2022  17:52:49 -------------------------------------------------------------------------------- ROS/PFSH Details Patient Name: Date of Service: Kathryn [name], Kathryn [name] 02/06/2022 9:45 A M Medical Record Number: [id number] Patient Account Number: [id number] Date of Birth/Sex: Treating [name]: 02-01-1934 (87 y.o. Kathryn [name] Primary Care Provider: Rory [name] Other Clinician: Referring Provider: Treating Provider/Extender: Kathryn [name] in Treatment: 0 Information Obtained From Patient Ear/Nose/Mouth/Throat Complaints and Symptoms: Negative for: Difficult clearing ears; Sinusitis Kathryn [name], Kathryn [name] ([id number]) [id number] Page 15 of 17 Hematologic/Lymphatic Complaints and Symptoms: Negative for: Bleeding / Clotting Disorders; Human Immunodeficiency Virus Respiratory Complaints and Symptoms: Positive for: Shortness of Breath Medical History: Positive for: Chronic Obstructive Pulmonary Disease (COPD) Cardiovascular Complaints and Symptoms: Positive for: LE edema Medical History: Positive for: Congestive Heart Failure; Coronary Artery Disease; Hypertension; Peripheral Arterial Disease; Peripheral Venous Disease Gastrointestinal Complaints and Symptoms: Negative for: Frequent diarrhea; Nausea; Vomiting Endocrine Complaints and Symptoms: Positive for: Thyroid disease Medical History: Positive for: Type II Diabetes Time with diabetes: 5 Treated with: Oral agents Blood sugar tested every day: No Genitourinary Complaints and Symptoms: Negative for: Kidney failure/ Dialysis; Incontinence/dribbling Immunological Complaints and Symptoms: Negative for: Hives; Itching Integumentary (Skin) Complaints and Symptoms: Positive for: Wounds; Bleeding or bruising tendency Musculoskeletal Medical History: Positive for: Rheumatoid Arthritis Neurologic Medical History: Positive for: Neuropathy Oncologic Medical History: Past Medical History  Notes: SKkin Cancers Immunizations Pneumococcal Vaccine: Received Pneumococcal Vaccination: Yes Received Pneumococcal Vaccination On or After 60th Birthday: Yes Implantable Devices No devices added Family and Social History Former smoker; Marital Status - Widowed; Alcohol Use: Never; Caffeine Use: Daily SONTEE, DESENA ([id number]) [id number] Page 16 of 17 Electronic Signature(s) Signed: 02/06/2022 5:24:20 PM By: Kathryn [name] Signed: 02/07/2022 2:43:36 PM By: Kathryn Keeler PA-C Entered By: Kathryn [name] BSN, [name], CWS, Kathryn on 02/06/2022 09:57:39 -------------------------------------------------------------------------------- SuperBill Details Patient Name: Date of Service: Kathryn [name], Kathryn [name] 02/06/2022 Medical Record Number: [id number] Patient Account Number: [id number] Date of Birth/Sex: Treating [name]: [birth year] (88 y.o. Kathryn [name], Kathryn Primary Care Provider: Rory [name] Other Clinician: Referring Provider: Treating Provider/Extender: Kathryn [name] in Treatment: 0 Diagnosis Coding ICD-10 Codes Code Description [phone] Type 2 diabetes mellitus with foot ulcer I73.89 Other specified peripheral vascular diseases L97.522 Non-pressure chronic ulcer of other part of left foot with fat layer exposed L97.512 Non-pressure chronic ulcer of other part of right foot with fat layer exposed L97.812 Non-pressure chronic ulcer of other part of right lower leg with fat layer exposed M62.81 Muscle weakness (generalized) I50.42 Chronic combined systolic (congestive) and diastolic (congestive) heart failure I10 Essential (primary) hypertension Facility Procedures : CPT4 Code: 94496759 Description: 16384 - WOUND CARE VISIT-LEV 5 EST PT Modifier: Quantity: 1 : CPT4 Code: 66599357 Description: 11042 - DEB SUBQ TISSUE 20 SQ CM/< ICD-10 Diagnosis Description L97.522 Non-pressure chronic ulcer of other part of left foot  with fat layer  exposed L97.512 Non-pressure chronic ulcer of other part of right foot with fat layer exposed  L97.812 Non-pressure chronic ulcer of other part of right lower leg with fat layer expos Modifier: ed Quantity: 1 : CPT4 Code: 53664403 Description: 47425 - DEBRIDE WOUND 1ST 20 SQ CM OR < ICD-10 Diagnosis Description L97.512 Non-pressure chronic ulcer of other part of right foot with fat layer exposed Modifier: Quantity: 1 Physician Procedures : CPT4 Code Description Modifier [id number] WC PHYS LEVEL 3 Kathryn PT 25 ICD-10 Diagnosis Description E11.621 Type 2 diabetes mellitus with foot ulcer I73.89 Other specified peripheral vascular diseases L97.522 Non-pressure chronic ulcer of other part of left  foot with fat layer exposed L97.512 Non-pressure chronic ulcer of other part of right foot with fat layer exposed Quantity: 1 : [id number] 11042 - WC PHYS SUBQ TISS 20 SQ CM ICD-10 Diagnosis Description L97.522 Non-pressure chronic ulcer of other part of left foot with fat layer exposed L97.512 Non-pressure chronic ulcer of other part of right foot with fat layer exposed Mitcham,  Yolandra [name] ([id number]) [id number] L97.812 Non-pressure chronic ulcer of other part of right lower leg with fat layer exposed Quantity: 1 _[id number].pdf Page 17 of 17 : [id number] 97597 - WC PHYS DEBR WO ANESTH 20 SQ CM ICD-10 Diagnosis Description L97.512 Non-pressure chronic ulcer of other part of right foot with fat layer exposed Quantity: 1 Electronic Signature(s) Signed: 02/06/2022 5:53:21 PM By: Kathryn Keeler PA-C Entered By: Kathryn [name] on 02/06/2022 17:53:21

## 2022-02-07 NOTE — Progress Notes (Signed)
Kathryn [name] ([id number]) [id number] [id number] Page 1 of 5 Visit Report for 02/06/2022 Abuse Risk Screen Details Patient Name: Date of Service: Kathryn [name] Kathryn L. 02/06/2022 9:45 A M Medical Record Number: [id number] Patient Account Number: [id number] Date of Birth/Sex: Treating RN: 12/07/1933 (88 y.o. Kathryn [name] Primary Care Tamaka Sawin: Rory Percy Other Clinician: Referring Klohe Lovering: Treating Genevie Elman/Extender: Lonell Face in Treatment: 0 Abuse Risk Screen Items Answer ABUSE RISK SCREEN: Has anyone close to you tried to hurt or harm you recentlyo No Do you feel uncomfortable with anyone in your familyo No Has anyone forced you do things that you didnt want to doo No Electronic Signature(s) Signed: 02/06/2022 5:24:20 PM By: Gretta Cool, BSN, RN, CWS, Kim RN, BSN Entered By: Gretta Cool, BSN, RN, CWS, Kim on 02/06/2022 09:57:44 -------------------------------------------------------------------------------- Activities of Daily Living Details Patient Name: Date of Service: Kathryn [name], DEA Kathryn L. 02/06/2022 9:45 A M Medical Record Number: [id number] Patient Account Number: [id number] Date of Birth/Sex: Treating RN: [birth year] (88 y.o. Kathryn [name] Primary Care Karam Dunson: Rory Percy Other Clinician: Referring Orren Pietsch: Treating Marjorie Lussier/Extender: Lonell Face in Treatment: 0 Activities of Daily Living Items Answer Activities of Daily Living (Please select one for each item) Drive Automobile Not Able T Medications ake Completely Able Use T elephone Completely Able Care for Appearance Completely Able Use T oilet Completely Able Bath / Shower Completely Able Dress Self Completely Able Feed Self Completely Able Walk Completely Able Get In / Out Bed Completely Able Housework Completely KIRA, HARTL ([id number]) [phone] [id number] Page 2 of 5 Prepare Meals Completely Able Handle  Money Completely Able Shop for Self Completely Able Electronic Signature(s) Signed: 02/06/2022 5:24:20 PM By: Gretta Cool, BSN, RN, CWS, Kim RN, BSN Entered By: Gretta Cool, BSN, RN, CWS, Kim on 02/06/2022 09:58:14 -------------------------------------------------------------------------------- Education Screening Details Patient Name: Date of Service: Kathryn [name], DEA Kathryn L. 02/06/2022 9:45 A M Medical Record Number: [id number] Patient Account Number: [id number] Date of Birth/Sex: Treating RN: [birth year] (88 y.o. Kathryn [name] Primary Care Moussa Wiegand: Rory Percy Other Clinician: Referring Christl Fessenden: Treating Nikitas Davtyan/Extender: Lonell Face in Treatment: 0 Primary Learner Assessed: Patient Learning Preferences/Education Level/Primary Language Learning Preference: Explanation Preferred Language: English Cognitive Barrier Language Barrier: No Translator Needed: No Memory Deficit: No Emotional Barrier: No Cultural/Religious Beliefs Affecting Medical Care: No Physical Barrier Impaired Vision: No Impaired Hearing: No Decreased Hand dexterity: No Knowledge/Comprehension Knowledge Level: High Comprehension Level: High Ability to understand written instructions: High Ability to understand verbal instructions: High Motivation Anxiety Level: Calm Cooperation: Cooperative Education Importance: Acknowledges Need Interest in Health Problems: Asks Questions Perception: Coherent Willingness to Engage in Self-Management High Activities: Readiness to Engage in Self-Management High Activities: Electronic Signature(s) Signed: 02/06/2022 5:24:20 PM By: Gretta Cool, BSN, RN, CWS, Kim RN, BSN Entered By: Gretta Cool, BSN, RN, CWS, Kim on 02/06/2022 09:58:44 Kathryn [name] ([id number]) [id number] Page 3 of 5 -------------------------------------------------------------------------------- Fall Risk Assessment Details Patient Name: Date of Service: Kathryn [name],  [place] Kathryn L. 02/06/2022 9:45 A M Medical Record Number: [id number] Patient Account Number: [id number] Date of Birth/Sex: Treating RN: 1933-10-02 (88 y.o. Kathryn [name] Primary Care Petula Rotolo: Rory Percy Other Clinician: Referring Katty Fretwell: Treating Jaelon Gatley/Extender: Lonell Face in Treatment: 0 Fall Risk Assessment Items Have you had 2 or more falls in the last 12 monthso 0 Yes Have you had any fall that resulted in injury in the last 12 monthso 0 Yes FALLS RISK SCREEN History of falling - immediate or within 3  months 25 Yes Secondary diagnosis (Do you have 2 or more medical diagnoseso) 0 No Ambulatory aid None/bed rest/wheelchair/nurse 0 Yes Crutches/cane/walker 15 Yes Furniture 30 Yes Intravenous therapy Access/Saline/Heparin Lock 0 No Gait/Transferring Normal/ bed rest/ wheelchair 0 Yes Weak (short steps with or without shuffle, stooped but able to lift head while walking, may seek 10 Yes support from furniture) Impaired (short steps with shuffle, may have difficulty arising from chair, head down, impaired 0 No balance) Mental Status Oriented to own ability 0 No Electronic Signature(s) Signed: 02/06/2022 5:24:20 PM By: Gretta Cool, BSN, RN, CWS, Kim RN, BSN Entered By: Gretta Cool, BSN, RN, CWS, Kim on 02/06/2022 09:59:37 -------------------------------------------------------------------------------- Foot Assessment Details Patient Name: Date of Service: Kathryn [name], DEA Kathryn L. 02/06/2022 9:45 A M Medical Record Number: [id number] Patient Account Number: [id number] Date of Birth/Sex: Treating RN: 10-31-1933 (88 y.o. Kathryn [name] Primary Care Ameerah Huffstetler: Rory Percy Other Clinician: Referring Taylin Leder: Treating Faust Thorington/Extender: Lonell Face in Treatment: 0 Foot Assessment Items Site Locations [place], [place] ([id number]) [id number] Page 4 of 5 + = Sensation present, - = Sensation absent, C = Callus, U =  Ulcer R = Redness, W = Warmth, M = Maceration, PU = Pre-ulcerative lesion F = Fissure, S = Swelling, D = Dryness Assessment Right: Left: Other Deformity: No No Prior Foot Ulcer: No No Prior Amputation: No No Charcot Joint: No No Ambulatory Status: Ambulatory Without Help Gait: Steady Electronic Signature(s) Signed: 02/06/2022 5:24:20 PM By: Gretta Cool, BSN, RN, CWS, Kim RN, BSN Entered By: Gretta Cool, BSN, RN, CWS, Kim on 02/06/2022 10:01:20 -------------------------------------------------------------------------------- Nutrition Risk Screening Details Patient Name: Date of Service: Kathryn [name], DEA Kathryn L. 02/06/2022 9:45 A M Medical Record Number: [id number] Patient Account Number: [id number] Date of Birth/Sex: Treating RN: 02/24/1934 (88 y.o. Charolette Forward, Kim Primary Care Blima Jaimes: Rory Percy Other Clinician: Referring Mahdi Frye: Treating Lashara Urey/Extender: Lonell Face in Treatment: 0 Height (in): 64 Weight (lbs): 178 Body Mass Index (BMI): 30.6 Nutrition Risk Screening Items Score Screening NUTRITION RISK SCREEN: I have an illness or condition that made me change the kind and/or amount of food I eat 0 No I eat fewer than two meals per day 3 Yes I eat few fruits and vegetables, or milk products 0 No I have three or more drinks of beer, liquor or wine almost every day 0 No I have tooth or mouth problems that make it hard for me to eat 0 No I don't always have enough money to buy the food I need 0 No Kathryn [name], Kathryn [name] ([id number]) [id number] Page 5 of 5 I eat alone most of the time 0 No I take three or more different prescribed or over-the-counter drugs a day 0 No Without wanting to, I have lost or gained 10 pounds in the last six months 0 No I am not always physically able to shop, cook and/or feed myself 0 No Nutrition Protocols Good Risk Protocol 0 No interventions needed Moderate Risk Protocol High Risk Proctocol Risk Level:  Moderate Risk Score: 3 Electronic Signature(s) Signed: 02/06/2022 5:24:20 PM By: Gretta Cool, BSN, RN, CWS, Kim RN, BSN Entered By: Gretta Cool, BSN, RN, CWS, Kim on 02/06/2022 10:00:18

## 2022-02-13 ENCOUNTER — Encounter: Payer: Medicare Other | Admitting: Physician Assistant

## 2022-02-13 ENCOUNTER — Ambulatory Visit: Payer: Medicare Other | Admitting: Physician Assistant

## 2022-02-13 DIAGNOSIS — E11621 Type 2 diabetes mellitus with foot ulcer: Secondary | ICD-10-CM | POA: Diagnosis not present

## 2022-02-13 DIAGNOSIS — M069 Rheumatoid arthritis, unspecified: Secondary | ICD-10-CM | POA: Diagnosis not present

## 2022-02-13 DIAGNOSIS — L97812 Non-pressure chronic ulcer of other part of right lower leg with fat layer exposed: Secondary | ICD-10-CM | POA: Diagnosis not present

## 2022-02-13 DIAGNOSIS — L97512 Non-pressure chronic ulcer of other part of right foot with fat layer exposed: Secondary | ICD-10-CM | POA: Diagnosis not present

## 2022-02-13 DIAGNOSIS — L97212 Non-pressure chronic ulcer of right calf with fat layer exposed: Secondary | ICD-10-CM | POA: Diagnosis not present

## 2022-02-13 DIAGNOSIS — E1151 Type 2 diabetes mellitus with diabetic peripheral angiopathy without gangrene: Secondary | ICD-10-CM | POA: Diagnosis not present

## 2022-02-13 DIAGNOSIS — L97522 Non-pressure chronic ulcer of other part of left foot with fat layer exposed: Secondary | ICD-10-CM | POA: Diagnosis not present

## 2022-02-13 NOTE — Progress Notes (Addendum)
ROLONDA, PONTARELLI ([id number]) [id number] Page 1 of 20 Visit Report for 02/13/2022 Arrival Information Details Patient Name: Date of Service: Kathryn [name], [place] [name] 02/13/2022 11:30 A M Medical Record Number: [id number] Patient Account Number: [id number] Date of Birth/Sex: Treating RN: 12-30-1933 (88 y.o. [name] Primary Care Kavin Weckwerth: Rory Percy Other Clinician: Referring Leng Montesdeoca: Treating Tron Flythe/Extender: Lonell Face in Treatment: 1 Visit Information History Since Last Visit Added or deleted any medications: No Patient Arrived: Wheel Chair Any new allergies or adverse reactions: No Arrival Time: 11:49 Had a fall or experienced change in No Accompanied By: sister activities of daily living that may affect Transfer Assistance: None risk of falls: Patient Identification Verified: Yes Hospitalized since last visit: No Secondary Verification Process Completed: Yes Pain Present Now: No Patient Requires Transmission-Based Precautions: No Patient Has Alerts: Yes Patient Alerts: Patient on Blood Thinner '81mg'$  daily Type II Diabees Electronic Signature(s) Signed: 02/13/2022 5:13:25 PM By: Rosalio Loud MSN RN CNS WTA Entered By: Rosalio Loud on 02/13/2022 12:11:31 -------------------------------------------------------------------------------- Clinic Level of Care Assessment Details Patient Name: Date of Service: Kathryn [name], [name] 02/13/2022 11:30 A M Medical Record Number: [id number] Patient Account Number: [id number] Date of Birth/Sex: Treating RN: [birth year] (88 y.o. [name] Primary Care Shakeya Kerkman: Rory Percy Other Clinician: Referring Jerricka Carvey: Treating Zehava Turski/Extender: Lonell Face in Treatment: 1 Clinic Level of Care Assessment Items TOOL 4 Quantity Score X- 1 0 Use when only an EandM is performed on FOLLOW-UP visit ASSESSMENTS - Nursing Assessment / Reassessment X- 1  10 Reassessment of Co-morbidities (includes updates in patient status) X- 1 5 Reassessment of Adherence to Treatment Plan ASSESSMENTS - Wound and Skin A ssessment / Reassessment '[]'$  - 0 Simple Wound Assessment / Reassessment - one wound Kathryn [name], Kathryn [name] ([id number]) [id number] Page 2 of 20 X- 7 5 Complex Wound Assessment / Reassessment - multiple wounds '[]'$  - 0 Dermatologic / Skin Assessment (not related to wound area) ASSESSMENTS - Focused Assessment '[]'$  - 0 Circumferential Edema Measurements - multi extremities '[]'$  - 0 Nutritional Assessment / Counseling / Intervention '[]'$  - 0 Lower Extremity Assessment (monofilament, tuning fork, pulses) '[]'$  - 0 Peripheral Arterial Disease Assessment (using hand held doppler) ASSESSMENTS - Ostomy and/or Continence Assessment and Care '[]'$  - 0 Incontinence Assessment and Management '[]'$  - 0 Ostomy Care Assessment and Management (repouching, etc.) PROCESS - Coordination of Care '[]'$  - 0 Simple Patient / Family Education for ongoing care X- 1 20 Complex (extensive) Patient / Family Education for ongoing care '[]'$  - 0 Staff obtains Programmer, systems, Records, T Results / Process Orders est '[]'$  - 0 Staff telephones HHA, Nursing Homes / Clarify orders / etc '[]'$  - 0 Routine Transfer to another Facility (non-emergent condition) '[]'$  - 0 Routine Hospital Admission (non-emergent condition) '[]'$  - 0 New Admissions / Biomedical engineer / Ordering NPWT Apligraf, etc. , '[]'$  - 0 Emergency Hospital Admission (emergent condition) '[]'$  - 0 Simple Discharge Coordination X- 1 15 Complex (extensive) Discharge Coordination PROCESS - Special Needs '[]'$  - 0 Pediatric / Minor Patient Management '[]'$  - 0 Isolation Patient Management '[]'$  - 0 Hearing / Language / Visual special needs '[]'$  - 0 Assessment of Community assistance (transportation, D/C planning, etc.) '[]'$  - 0 Additional assistance / Altered mentation '[]'$  - 0 Support Surface(s) Assessment (bed,  cushion, seat, etc.) INTERVENTIONS - Wound Cleansing / Measurement '[]'$  - 0 Simple Wound Cleansing - one wound X- 7 5 Complex Wound Cleansing - multiple wounds X- 1 5 Wound Imaging (photographs -  any number of wounds) '[]'$  - 0 Wound Tracing (instead of photographs) '[]'$  - 0 Simple Wound Measurement - one wound X- 7 5 Complex Wound Measurement - multiple wounds INTERVENTIONS - Wound Dressings X - Small Wound Dressing one or multiple wounds 7 10 '[]'$  - 0 Medium Wound Dressing one or multiple wounds '[]'$  - 0 Large Wound Dressing one or multiple wounds '[]'$  - 0 Application of Medications - topical '[]'$  - 0 Application of Medications - injection INTERVENTIONS - Miscellaneous '[]'$  - 0 External ear exam '[]'$  - 0 Specimen Collection (cultures, biopsies, blood, body fluids, etc.) Kathryn [name], Kathryn [name] ([id number]) [id number] Page 3 of 20 '[]'$  - 0 Specimen(s) / Culture(s) sent or taken to Lab for analysis '[]'$  - 0 Patient Transfer (multiple staff / Harrel Lemon Lift / Similar devices) '[]'$  - 0 Simple Staple / Suture removal (25 or less) '[]'$  - 0 Complex Staple / Suture removal (26 or more) '[]'$  - 0 Hypo / Hyperglycemic Management (close monitor of Blood Glucose) '[]'$  - 0 Ankle / Brachial Index (ABI) - do not check if billed separately X- 1 5 Vital Signs Has the patient been seen at the hospital within the last three years: Yes Total Score: 235 Level Of Care: New/Established - Level 5 Electronic Signature(s) Signed: 02/13/2022 5:13:25 PM By: Rosalio Loud MSN RN CNS WTA Entered By: Rosalio Loud on 02/13/2022 12:18:04 -------------------------------------------------------------------------------- Encounter Discharge Information Details Patient Name: Date of Service: Kathryn [name], [name] 02/13/2022 11:30 A M Medical Record Number: [id number] Patient Account Number: [id number] Date of Birth/Sex: Treating RN: 1933-05-28 (88 y.o. [name] Primary Care Maryjo Ragon: Rory Percy Other  Clinician: Referring Sumiya Mamaril: Treating Malynda Smolinski/Extender: Lonell Face in Treatment: 1 Encounter Discharge Information Items Discharge Condition: Stable Ambulatory Status: Wheelchair Discharge Destination: Home Transportation: Private Auto Accompanied By: sister Schedule Follow-up Appointment: Yes Clinical Summary of Care: Electronic Signature(s) Signed: 02/13/2022 5:13:25 PM By: Rosalio Loud MSN RN CNS WTA Entered By: Rosalio Loud on 02/13/2022 12:19:13 -------------------------------------------------------------------------------- Lower Extremity Assessment Details Patient Name: Date of Service: Kathryn [name], [name] 02/13/2022 11:30 A M Medical Record Number: [id number] Patient Account Number: [id number] Date of Birth/Sex: Treating RN: [birth year] (88 y.o. Nakima, Flueg[place], Kathryn [name] ([id number]) [phone] Page 4 of 20 Primary Care Amauria Younts: Rory Percy Other Clinician: Referring Kaveh Kissinger: Treating Chenay Nesmith/Extender: Lonell Face in Treatment: 1 Edema Assessment Assessed: [Left: No] [Right: No] Edema: [Left: N] [Right: o] Calf Left: Right: Point of Measurement: 31 cm From Medial Instep 37.6 cm 38 cm Ankle Left: Right: Point of Measurement: 12 cm From Medial Instep 24.5 cm 28 cm Vascular Assessment Pulses: Dorsalis Pedis Palpable: [Left:Yes] [Right:Yes] Electronic Signature(s) Signed: 02/13/2022 5:13:25 PM By: Rosalio Loud MSN RN CNS WTA Entered By: Rosalio Loud on 02/13/2022 12:16:10 -------------------------------------------------------------------------------- Multi Wound Chart Details Patient Name: Date of Service: Kathryn [name], [name] 02/13/2022 11:30 A M Medical Record Number: [id number] Patient Account Number: [id number] Date of Birth/Sex: Treating RN: 1933-06-03 (88 y.o. [name] Primary Care Keenya Matera: Rory Percy Other Clinician: Referring Jamisyn Langer: Treating  Shazia Mitchener/Extender: Lonell Face in Treatment: 1 Vital Signs Height(in): 64 Pulse(bpm): 70 Weight(lbs): 178 Blood Pressure(mmHg): 134/74 Body Mass Index(BMI): 30.6 Temperature(F): 97.7 Respiratory Rate(breaths/min): 16 [1:Photos:] Left T Fourth oe Left, Lateral Foot Right, Lateral Lower Leg Wound Location: Trauma Gradually Appeared Gradually Appeared Wounding Event: Diabetic Wound/Ulcer of the Lower Diabetic Wound/Ulcer of the Lower Diabetic Wound/Ulcer of the Lower Primary Etiology: Extremity Extremity Extremity Chronic Obstructive Pulmonary Chronic Obstructive Pulmonary  Chronic Obstructive Pulmonary Comorbid HistoryCHANTA, BAUERS ([id number]) [id number] Page 5 of 20 Disease (COPD), Congestive Heart Disease (COPD), Congestive Heart Disease (COPD), Congestive Heart Failure, Coronary Artery Disease, Failure, Coronary Artery Disease, Failure, Coronary Artery Disease, Hypertension, Peripheral Arterial Hypertension, Peripheral Arterial Hypertension, Peripheral Arterial Disease, Peripheral Venous Disease, Disease, Peripheral Venous Disease, Disease, Peripheral Venous Disease, Type II Diabetes, Rheumatoid Arthritis, Type II Diabetes, Rheumatoid Arthritis, Type II Diabetes, Rheumatoid Arthritis, Neuropathy Neuropathy Neuropathy 01/22/2022 01/22/2022 01/22/2022 Date Acquired: '1 1 1 '$ Weeks of Treatment: Open Open Open Wound Status: No No No Wound Recurrence: 0.5x0.6x0.1 0.5x0.3x0.1 0.5x0.5x0.1 Measurements [name] x W x D (cm) 0.236 0.118 0.196 A (cm) : rea 0.024 0.012 0.02 Volume (cm) : 52.30% 70.00% 68.80% % Reduction in A rea: 51.00% 69.20% 68.30% % Reduction in Volume: Grade 1 Grade 1 Grade 1 Classification: Medium Medium Medium Exudate A mount: Serous Serous Serous Exudate Type: amber amber amber Exudate Color: None Present (0%) None Present (0%) None Present (0%) Granulation A mount: Large (67-100%) Large (67-100%)  Large (67-100%) Necrotic A mount: Adherent Slough Adherent Slough Eschar Necrotic Tissue: Fat Layer (Subcutaneous Tissue): Yes Fat Layer (Subcutaneous Tissue): Yes Fat Layer (Subcutaneous Tissue): Yes Exposed Structures: Fascia: No Fascia: No Fascia: No Tendon: No Tendon: No Tendon: No Muscle: No Muscle: No Muscle: No Joint: No Joint: No Joint: No Bone: No Bone: No Bone: No Wound Number: '4 5 6 '$ Photos: Right, Proximal Lower Leg Right, Distal, Posterior Lower Leg Right, Proximal, Posterior Lower Leg Wound Location: Gradually Appeared Gradually Appeared Gradually Appeared Wounding Event: Diabetic Wound/Ulcer of the Lower Diabetic Wound/Ulcer of the Lower Diabetic Wound/Ulcer of the Lower Primary Etiology: Extremity Extremity Extremity Chronic Obstructive Pulmonary Chronic Obstructive Pulmonary Chronic Obstructive Pulmonary Comorbid History: Disease (COPD), Congestive Heart Disease (COPD), Congestive Heart Disease (COPD), Congestive Heart Failure, Coronary Artery Disease, Failure, Coronary Artery Disease, Failure, Coronary Artery Disease, Hypertension, Peripheral Arterial Hypertension, Peripheral Arterial Hypertension, Peripheral Arterial Disease, Peripheral Venous Disease, Disease, Peripheral Venous Disease, Disease, Peripheral Venous Disease, Type II Diabetes, Rheumatoid Arthritis, Type II Diabetes, Rheumatoid Arthritis, Type II Diabetes, Rheumatoid Arthritis, Neuropathy Neuropathy Neuropathy 01/15/2022 01/15/2022 12/18/2021 Date Acquired: '1 1 1 '$ Weeks of Treatment: Open Open Open Wound Status: No No No Wound Recurrence: 0.5x0.5x0.1 0.8x0.5x0.2 0.3x0.4x0.1 Measurements [name] x W x D (cm) 0.196 0.314 0.094 A (cm) : rea 0.02 0.063 0.009 Volume (cm) : 44.50% 33.30% 0.00% % Reduction in A rea: 42.90% 33.00% 0.00% % Reduction in Volume: Grade 1 Grade 1 Grade 1 Classification: Medium Medium Medium Exudate A mount: Serous Serous Serous Exudate Type: amber amber  amber Exudate Color: Small (1-33%) None Present (0%) None Present (0%) Granulation A mount: Large (67-100%) Large (67-100%) Large (67-100%) Necrotic A mount: N/A Adherent Slough Adherent Slough Necrotic Tissue: Fat Layer (Subcutaneous Tissue): Yes Fat Layer (Subcutaneous Tissue): Yes Fat Layer (Subcutaneous Tissue): Yes Exposed Structures: Fascia: No Fascia: No Tendon: No Tendon: No Muscle: No Muscle: No Joint: No Joint: No Bone: No Bone: No Wound Number: 7 N/A N/A Photos: N/A N/A Right T Second oe N/A N/A Wound LocationELISAVET, Kathryn [name] ([id number]) [id number] Page 6 of 20 Gradually Appeared N/A N/A Wounding Event: Diabetic Wound/Ulcer of the Lower N/A N/A Primary Etiology: Extremity Chronic Obstructive Pulmonary N/A N/A Comorbid History: Disease (COPD), Congestive Heart Failure, Coronary Artery Disease, Hypertension, Peripheral Arterial Disease, Peripheral Venous Disease, Type II Diabetes, Rheumatoid Arthritis, Neuropathy 01/15/2022 N/A N/A Date Acquired: 1 N/A N/A Weeks of Treatment: Open N/A N/A Wound Status: No N/A N/A Wound Recurrence: 0.4x0.4x0.1 N/A N/A Measurements  [name] x W x D (cm) 0.126 N/A N/A A (cm) : rea 0.013 N/A N/A Volume (cm) : 35.70% N/A N/A % Reduction in A rea: 66.70% N/A N/A % Reduction in Volume: Grade 1 N/A N/A Classification: Medium N/A N/A Exudate A mount: Serous N/A N/A Exudate Type: amber N/A N/A Exudate Color: None Present (0%) N/A N/A Granulation A mount: Large (67-100%) N/A N/A Necrotic A mount: Adherent Slough N/A N/A Necrotic Tissue: Fat Layer (Subcutaneous Tissue): Yes N/A N/A Exposed Structures: Fascia: No Tendon: No Muscle: No Joint: No Bone: No Treatment Notes Wound #1 (Toe Fourth) Wound Laterality: Left Cleanser Peri-Wound Care Topical Primary Dressing Secondary Dressing Secured With Compression Wrap Compression Stockings Add-Ons Wound #2 (Foot) Wound Laterality:  Left, Lateral Cleanser Peri-Wound Care Topical Primary Dressing Secondary Dressing Secured With Compression Wrap Compression Stockings Add-Ons Wound #3 (Lower Leg) Wound Laterality: Right, Lateral Cleanser Peri-Wound Care Topical Primary Dressing Secondary Dressing ILARIA, MUCH [name] ([id number]) [id number] Page 7 of 20 Secured With Compression Wrap Compression Stockings Add-Ons Wound #4 (Lower Leg) Wound Laterality: Right, Proximal Cleanser Peri-Wound Care Topical Primary Dressing Secondary Dressing Secured With Compression Wrap Compression Stockings Add-Ons Wound #5 (Lower Leg) Wound Laterality: Right, Posterior, Distal Cleanser Peri-Wound Care Topical Primary Dressing Secondary Dressing Secured With Compression Wrap Compression Stockings Add-Ons Wound #6 (Lower Leg) Wound Laterality: Right, Posterior, Proximal Cleanser Peri-Wound Care Topical Primary Dressing Secondary Dressing Secured With Compression Wrap Compression Stockings Add-Ons Wound #7 (Toe Second) Wound Laterality: Right Cleanser Peri-Wound Care Topical Primary Dressing Secondary Dressing Secured With Compression Wrap Compression Stockings Kathryn [name], Kathryn [name] ([id number]) [id number] Page 8 of 20 Add-Ons Electronic Signature(s) Signed: 02/13/2022 5:13:25 PM By: Rosalio Loud MSN RN CNS WTA Entered By: Rosalio Loud on 02/13/2022 12:16:31 -------------------------------------------------------------------------------- Multi-Disciplinary Care Plan Details Patient Name: Date of Service: Kathryn [name], [name] 02/13/2022 11:30 A M Medical Record Number: [id number] Patient Account Number: [id number] Date of Birth/Sex: Treating RN: 06-Jul-1933 (88 y.o. [name] Primary Care Maytal Mijangos: Rory Percy Other Clinician: Referring Adriona Kaney: Treating Deanta Mincey/Extender: Lonell Face in Treatment: 1 Active Inactive Abuse /  Safety / Falls / Self Care Management Nursing Diagnoses: History of Falls Impaired physical mobility Potential for falls Potential for injury related to falls Goals: Patient/caregiver will verbalize understanding of skin care regimen Date Initiated: 02/06/2022 Target Resolution Date: 02/06/2022 Goal Status: Active Patient/caregiver will verbalize/demonstrate measures taken to improve the patient's personal safety Date Initiated: 02/06/2022 Target Resolution Date: 02/06/2022 Goal Status: Active Interventions: Assess fall risk on admission and as needed Notes: Necrotic Tissue Nursing Diagnoses: Impaired tissue integrity related to necrotic/devitalized tissue Knowledge deficit related to management of necrotic/devitalized tissue Goals: Necrotic/devitalized tissue will be minimized in the wound bed Date Initiated: 02/06/2022 Target Resolution Date: 02/06/2022 Goal Status: Active Patient/caregiver will verbalize understanding of reason and process for debridement of necrotic tissue Date Initiated: 02/06/2022 Target Resolution Date: 02/06/2022 Goal Status: Active Interventions: Assess patient pain level pre-, during and post procedure and prior to discharge Provide education on necrotic tissue and debridement process Treatment Activities: Apply topical anesthetic as ordered : 02/06/2022 Excisional debridement : 02/06/2022 Kathryn [name], Kathryn [name] ([id number]) [id number] Page 9 of 20 Notes: Orientation to the Wound Care Program Nursing Diagnoses: Knowledge deficit related to the wound healing center program Goals: Patient/caregiver will verbalize understanding of the [place] Program Date Initiated: 02/06/2022 Target Resolution Date: 02/06/2022 Goal Status: Active Interventions: Provide education on orientation to the wound center Notes: Venous Leg Ulcer Nursing Diagnoses: Actual venous Insuffiency (use after diagnosis is  confirmed) Knowledge deficit related to disease process and management Goals: Non-invasive venous studies are completed as ordered Date Initiated: 02/06/2022 Target Resolution Date: 01/25/2022 Goal Status: Active Patient will maintain optimal edema control Date Initiated: 02/06/2022 Target Resolution Date: 02/06/2022 Goal Status: Active Patient/caregiver will verbalize understanding of disease process and disease management Date Initiated: 02/06/2022 Target Resolution Date: 02/06/2022 Goal Status: Active Interventions: Assess peripheral edema status every visit. Notes: Wound/Skin Impairment Nursing Diagnoses: Impaired tissue integrity Knowledge deficit related to smoking impact on wound healing Goals: Patient/caregiver will verbalize understanding of skin care regimen Date Initiated: 02/06/2022 Target Resolution Date: 02/06/2022 Goal Status: Active Ulcer/skin breakdown will have a volume reduction of 30% by week 4 Date Initiated: 02/06/2022 Target Resolution Date: 02/27/2022 Goal Status: Active Interventions: Assess ulceration(s) every visit Treatment Activities: Referred to DME Emine Lopata for dressing supplies : 02/06/2022 Skin care regimen initiated : 02/06/2022 Topical wound management initiated : 02/06/2022 Notes: Electronic Signature(s) Signed: 02/13/2022 5:13:25 PM By: Rosalio Loud MSN RN CNS WTA Entered By: Rosalio Loud on 02/13/2022 12:16:15 Kathryn [name] ([id number]) [id number] Page 10 of 20 -------------------------------------------------------------------------------- Pain Assessment Details Patient Name: Date of Service: Kathryn [name] [name] 02/13/2022 11:30 A M Medical Record Number: [id number] Patient Account Number: [id number] Date of Birth/Sex: Treating RN: 05/14/1933 (88 y.o. [name] Primary Care Karlye Ihrig: Rory Percy Other Clinician: Referring Alfredia Desanctis: Treating Joshwa Hemric/Extender: Lonell Face in Treatment: 1 Active Problems Location of Pain Severity and Description of Pain Patient Has Paino No Site Locations Pain Management and Medication Current Pain Management: Electronic Signature(s) Signed: 02/13/2022 5:13:25 PM By: Rosalio Loud MSN RN CNS WTA Entered By: Rosalio Loud on 02/13/2022 12:11:45 -------------------------------------------------------------------------------- Patient/Caregiver Education Details Patient Name: Date of Service: Kathryn [name], [name] 11/21/2023andnbsp11:30 A M Medical Record Number: [id number] Patient Account Number: [id number] Date of Birth/Gender: Treating RN: 08-Aug-1933 (87 y.o. [name] Primary Care Physician: Rory Percy Other Clinician: Referring Physician: Treating Physician/Extender: Lonell Face in Treatment: [place], [place] ([id number]) [id number] Page 11 of 20 Education Assessment Education Provided To: Patient Education Topics Provided Wound/Skin Impairment: Handouts: Caring for Your Ulcer Methods: Explain/Verbal Responses: State content correctly Electronic Signature(s) Signed: 02/13/2022 5:13:25 PM By: Rosalio Loud MSN RN CNS WTA Entered By: Rosalio Loud on 02/13/2022 12:18:31 -------------------------------------------------------------------------------- Wound Assessment Details Patient Name: Date of Service: Kathryn [name], [name] 02/13/2022 11:30 A M Medical Record Number: [id number] Patient Account Number: [id number] Date of Birth/Sex: Treating RN: [birth year] (88 y.o. [name] Primary Care Toma Erichsen: Rory Percy Other Clinician: Referring Bernerd Terhune: Treating Fountain Derusha/Extender: Lonell Face in Treatment: 1 Wound Status Wound Number: 1 Primary Diabetic Wound/Ulcer of the Lower Extremity Etiology: Wound Location: Left T Fourth oe Wound Open Wounding Event: Trauma Status: Date Acquired: 01/22/2022 Comorbid Chronic  Obstructive Pulmonary Disease (COPD), Congestive Heart Weeks Of Treatment: 1 History: Failure, Coronary Artery Disease, Hypertension, Peripheral Arterial Clustered Wound: No Disease, Peripheral Venous Disease, Type II Diabetes, Rheumatoid Arthritis, Neuropathy Photos Wound Measurements Length: (cm) 0.5 Width: (cm) 0.6 Depth: (cm) 0.1 Area: (cm) 0.236 Volume: (cm) 0.024 % Reduction in Area: 52.3% % Reduction in Volume: 51% Wound Description Classification: Grade 1 Exudate Amount: Medium Cashatt, Carrisa [name] ([id number]) Exudate Type: Serous Exudate Color: amber Foul Odor After Cleansing: No Slough/Fibrino Yes [id number] Page 12 of 20 Wound Bed Granulation Amount: None Present (0%) Exposed Structure Necrotic Amount: Large (67-100%) Fascia Exposed: No Necrotic Quality: Adherent Slough Fat Layer (Subcutaneous Tissue) Exposed: Yes Tendon Exposed: No Muscle Exposed: No  Joint Exposed: No Bone Exposed: No Treatment Notes Wound #1 (Toe Fourth) Wound Laterality: Left Cleanser Peri-Wound Care Topical Primary Dressing Secondary Dressing Secured With Compression Wrap Compression Stockings Add-Ons Electronic Signature(s) Signed: 02/13/2022 5:13:25 PM By: Rosalio Loud MSN RN CNS WTA Entered By: Rosalio Loud on 02/13/2022 12:05:17 -------------------------------------------------------------------------------- Wound Assessment Details Patient Name: Date of Service: Kathryn [name], [name] 02/13/2022 11:30 A M Medical Record Number: [id number] Patient Account Number: [id number] Date of Birth/Sex: Treating RN: [birth year] (88 y.o. [name] Primary Care Rosaleah Person: Rory Percy Other Clinician: Referring Bently Wyss: Treating Tishawn Friedhoff/Extender: Lonell Face in Treatment: 1 Wound Status Wound Number: 2 Primary Diabetic Wound/Ulcer of the Lower Extremity Etiology: Wound Location: Left, Lateral Foot Wound Open Wounding Event:  Gradually Appeared Status: Date Acquired: 01/22/2022 Comorbid Chronic Obstructive Pulmonary Disease (COPD), Congestive Heart Weeks Of Treatment: 1 History: Failure, Coronary Artery Disease, Hypertension, Peripheral Arterial Clustered Wound: No Disease, Peripheral Venous Disease, Type II Diabetes, Rheumatoid Arthritis, Neuropathy Photos Kathryn [name], Kathryn [name] ([id number]) [id number] Page 13 of 20 Wound Measurements Length: (cm) 0.5 Width: (cm) 0.3 Depth: (cm) 0.1 Area: (cm) 0.118 Volume: (cm) 0.012 % Reduction in Area: 70% % Reduction in Volume: 69.2% Wound Description Classification: Grade 1 Exudate Amount: Medium Exudate Type: Serous Exudate Color: amber Foul Odor After Cleansing: No Slough/Fibrino Yes Wound Bed Granulation Amount: None Present (0%) Exposed Structure Necrotic Amount: Large (67-100%) Fascia Exposed: No Necrotic Quality: Adherent Slough Fat Layer (Subcutaneous Tissue) Exposed: Yes Tendon Exposed: No Muscle Exposed: No Joint Exposed: No Bone Exposed: No Treatment Notes Wound #2 (Foot) Wound Laterality: Left, Lateral Cleanser Peri-Wound Care Topical Primary Dressing Secondary Dressing Secured With Compression Wrap Compression Stockings Add-Ons Electronic Signature(s) Signed: 02/13/2022 5:13:25 PM By: Rosalio Loud MSN RN CNS WTA Entered By: Rosalio Loud on 02/13/2022 12:05:45 Wound Assessment Details -------------------------------------------------------------------------------- Kathryn [name] ([id number]) [id number] Page 14 of 20 Patient Name: Date of Service: Kathryn [name], [place] [name] 02/13/2022 11:30 A M Medical Record Number: [id number] Patient Account Number: [id number] Date of Birth/Sex: Treating RN: Jul 25, 1933 (88 y.o. [name] Primary Care Jarika Robben: Rory Percy Other Clinician: Referring Jarod Bozzo: Treating Ellory Khurana/Extender: Lonell Face in Treatment: 1 Wound  Status Wound Number: 3 Primary Diabetic Wound/Ulcer of the Lower Extremity Etiology: Wound Location: Right, Lateral Lower Leg Wound Open Wounding Event: Gradually Appeared Status: Date Acquired: 01/22/2022 Comorbid Chronic Obstructive Pulmonary Disease (COPD), Congestive Heart Weeks Of Treatment: 1 History: Failure, Coronary Artery Disease, Hypertension, Peripheral Arterial Clustered Wound: No Disease, Peripheral Venous Disease, Type II Diabetes, Rheumatoid Arthritis, Neuropathy Photos Wound Measurements Length: (cm) 0.5 % Reduction in Area: 68.8% Width: (cm) 0.5 % Reduction in Volume: 68.3% Depth: (cm) 0.1 Area: (cm) 0.196 Volume: (cm) 0.02 Wound Description Classification: Grade 1 Foul Odor After Cleansing: No Exudate Amount: Medium Slough/Fibrino No Exudate Type: Serous Exudate Color: amber Wound Bed Granulation Amount: None Present (0%) Exposed Structure Necrotic Amount: Large (67-100%) Fascia Exposed: No Necrotic Quality: Eschar Fat Layer (Subcutaneous Tissue) Exposed: Yes Tendon Exposed: No Muscle Exposed: No Joint Exposed: No Bone Exposed: No Treatment Notes Wound #3 (Lower Leg) Wound Laterality: Right, Lateral Cleanser Peri-Wound Care Topical Primary Dressing Secondary Dressing Secured With Compression Wrap Compression Stockings Add-Ons Electronic Signature(s) Signed: 02/13/2022 5:13:25 PM By: Rosalio Loud MSN RN CNS Kathryn [name], Kathryn [name] ([id number]) PM By: Rosalio Loud MSN RN CNS Lissa Morales [phone].pdf Page 15 of 20 Signed: 02/13/2022 5:13:25 Entered By: Rosalio Loud on 02/13/2022 12:06:09 -------------------------------------------------------------------------------- Wound Assessment Details Patient Name: Date of Service: Kathryn [name], [place]A NNA  [name] 02/13/2022 11:30 A M Medical Record Number: [id number] Patient Account Number: [id number] Date of Birth/Sex: Treating RN: 1933/04/12 (88 y.o. [name] Primary Care Fredric Slabach: Rory Percy Other Clinician: Referring Derwood Becraft: Treating Dino Borntreger/Extender: Lonell Face in Treatment: 1 Wound Status Wound Number: 4 Primary Diabetic Wound/Ulcer of the Lower Extremity Etiology: Wound Location: Right, Proximal Lower Leg Wound Open Wounding Event: Gradually Appeared Status: Date Acquired: 01/15/2022 Comorbid Chronic Obstructive Pulmonary Disease (COPD), Congestive Heart Weeks Of Treatment: 1 History: Failure, Coronary Artery Disease, Hypertension, Peripheral Arterial Clustered Wound: No Disease, Peripheral Venous Disease, Type II Diabetes, Rheumatoid Arthritis, Neuropathy Photos Wound Measurements Length: (cm) 0.5 Width: (cm) 0.5 Depth: (cm) 0.1 Area: (cm) 0.196 Volume: (cm) 0.02 % Reduction in Area: 44.5% % Reduction in Volume: 42.9% Wound Description Classification: Grade 1 Exudate Amount: Medium Exudate Type: Serous Exudate Color: amber Foul Odor After Cleansing: No Slough/Fibrino No Wound Bed Granulation Amount: Small (1-33%) Exposed Structure Necrotic Amount: Large (67-100%) Fascia Exposed: No Fat Layer (Subcutaneous Tissue) Exposed: Yes Tendon Exposed: No Muscle Exposed: No Joint Exposed: No Bone Exposed: No Treatment Notes Wound #4 (Lower Leg) Wound Laterality: Right, Proximal Kathryn [name], Kathryn [name] ([id number]) [id number] Page 16 of 20 Cleanser Peri-Wound Care Topical Primary Dressing Secondary Dressing Secured With Compression Wrap Compression Stockings Add-Ons Electronic Signature(s) Signed: 02/13/2022 5:13:25 PM By: Rosalio Loud MSN RN CNS WTA Entered By: Rosalio Loud on 02/13/2022 12:06:34 -------------------------------------------------------------------------------- Wound Assessment Details Patient Name: Date of Service: Kathryn [name], [name] 02/13/2022 11:30 A M Medical Record Number: [id number] Patient Account Number: [id number] Date of Birth/Sex: Treating RN: 1933-09-11 (88 y.o. [name] Primary Care Charmaine Placido: Rory Percy Other Clinician: Referring Fredric Slabach: Treating Charday Capetillo/Extender: Lonell Face in Treatment: 1 Wound Status Wound Number: 5 Primary Diabetic Wound/Ulcer of the Lower Extremity Etiology: Wound Location: Right, Distal, Posterior Lower Leg Wound Open Wounding Event: Gradually Appeared Status: Date Acquired: 01/15/2022 Comorbid Chronic Obstructive Pulmonary Disease (COPD), Congestive Heart Weeks Of Treatment: 1 History: Failure, Coronary Artery Disease, Hypertension, Peripheral Arterial Clustered Wound: No Disease, Peripheral Venous Disease, Type II Diabetes, Rheumatoid Arthritis, Neuropathy Photos Wound Measurements Length: (cm) 0.8 Width: (cm) 0.5 Depth: (cm) 0.2 Area: (cm) 0.314 Volume: (cm) 0.063 % Reduction in Area: 33.3% % Reduction in Volume: 33% Wound Description Classification: Grade 1 Kathryn [name], Kathryn [name] ([id number]) Exudate Amount: Medium Exudate Type: Serous Exudate Color: amber [id number] Page 17 of 20 Wound Bed Granulation Amount: None Present (0%) Exposed Structure Necrotic Amount: Large (67-100%) Fat Layer (Subcutaneous Tissue) Exposed: Yes Necrotic Quality: Adherent Slough Treatment Notes Wound #5 (Lower Leg) Wound Laterality: Right, Posterior, Distal Cleanser Peri-Wound Care Topical Primary Dressing Secondary Dressing Secured With Compression Wrap Compression Stockings Add-Ons Electronic Signature(s) Signed: 02/13/2022 5:13:25 PM By: Rosalio Loud MSN RN CNS WTA Entered By: Rosalio Loud on 02/13/2022 12:13:48 -------------------------------------------------------------------------------- Wound Assessment Details Patient Name: Date of Service: Kathryn [name], [name] 02/13/2022 11:30 A M Medical Record Number: [id number] Patient Account Number: [id number] Date of Birth/Sex: Treating RN: [birth year] (88 y.o. [name] Primary Care Treyshaun Keatts: Rory Percy Other Clinician: Referring Romuald Mccaslin: Treating Jaymi Tinner/Extender: Lonell Face in Treatment: 1 Wound Status Wound Number: 6 Primary Diabetic Wound/Ulcer of the Lower Extremity Etiology: Wound Location: Right, Proximal, Posterior Lower Leg Wound Open Wounding Event: Gradually Appeared Status: Date Acquired: 12/18/2021 Comorbid Chronic Obstructive Pulmonary Disease (COPD), Congestive Heart Weeks Of Treatment: 1 History: Failure, Coronary Artery Disease, Hypertension, Peripheral Arterial Clustered Wound: No Disease, Peripheral Venous Disease, Type II Diabetes, Rheumatoid  Arthritis, Neuropathy Photos Kathryn [name], Kathryn [name] ([id number]) [id number] Page 18 of 20 Wound Measurements Length: (cm) 0.3 Width: (cm) 0.4 Depth: (cm) 0.1 Area: (cm) 0.094 Volume: (cm) 0.009 % Reduction in Area: 0% % Reduction in Volume: 0% Wound Description Classification: Grade 1 Exudate Amount: Medium Exudate Type: Serous Exudate Color: amber Slough/Fibrino Yes Wound Bed Granulation Amount: None Present (0%) Exposed Structure Necrotic Amount: Large (67-100%) Fascia Exposed: No Necrotic Quality: Adherent Slough Fat Layer (Subcutaneous Tissue) Exposed: Yes Tendon Exposed: No Muscle Exposed: No Joint Exposed: No Bone Exposed: No Treatment Notes Wound #6 (Lower Leg) Wound Laterality: Right, Posterior, Proximal Cleanser Peri-Wound Care Topical Primary Dressing Secondary Dressing Secured With Compression Wrap Compression Stockings Add-Ons Electronic Signature(s) Signed: 02/13/2022 5:13:25 PM By: Rosalio Loud MSN RN CNS WTA Entered By: Rosalio Loud on 02/13/2022 12:14:18 Wound Assessment Details -------------------------------------------------------------------------------- Kathryn [name] ([id number]) [id number] Page 19 of 20 Patient Name: Date of Service: Kathryn [name], [place] [name] 02/13/2022 11:30 A M Medical Record  Number: [id number] Patient Account Number: [id number] Date of Birth/Sex: Treating RN: June 03, 1933 (88 y.o. [name] Primary Care Khani Paino: Rory Percy Other Clinician: Referring Charae Depaolis: Treating Kimberley Dastrup/Extender: Lonell Face in Treatment: 1 Wound Status Wound Number: 7 Primary Diabetic Wound/Ulcer of the Lower Extremity Etiology: Wound Location: Right T Second oe Wound Open Wounding Event: Gradually Appeared Status: Date Acquired: 01/15/2022 Comorbid Chronic Obstructive Pulmonary Disease (COPD), Congestive Heart Weeks Of Treatment: 1 History: Failure, Coronary Artery Disease, Hypertension, Peripheral Arterial Clustered Wound: No Disease, Peripheral Venous Disease, Type II Diabetes, Rheumatoid Arthritis, Neuropathy Photos Wound Measurements Length: (cm) 0.4 % Reduction in Area: 35.7% Width: (cm) 0.4 % Reduction in Volume: 66.7% Depth: (cm) 0.1 Area: (cm) 0.126 Volume: (cm) 0.013 Wound Description Classification: Grade 1 Exudate Amount: Medium Exudate Type: Serous Exudate Color: amber Wound Bed Granulation Amount: None Present (0%) Exposed Structure Necrotic Amount: Large (67-100%) Fascia Exposed: No Necrotic Quality: Adherent Slough Fat Layer (Subcutaneous Tissue) Exposed: Yes Tendon Exposed: No Muscle Exposed: No Joint Exposed: No Bone Exposed: No Treatment Notes Wound #7 (Toe Second) Wound Laterality: Right Cleanser Peri-Wound Care Topical Primary Dressing Secondary Dressing Secured With Compression Wrap Compression Stockings Add-Ons Electronic Signature(s) Signed: 02/13/2022 5:13:25 PM By: Rosalio Loud MSN RN CNS Kathryn [name], Kathryn [name] ([id number]) PM By: Rosalio Loud MSN RN CNS Lissa Morales [phone].pdf Page 20 of 20 Signed: 02/13/2022 5:13:25 Entered By: Rosalio Loud on 02/13/2022 12:07:44 -------------------------------------------------------------------------------- Vitals Details Patient Name: Date  of Service: Kathryn [name], [name] 02/13/2022 11:30 A M Medical Record Number: [id number] Patient Account Number: [id number] Date of Birth/Sex: Treating RN: 11-03-1933 (88 y.o. [name] Primary Care Dannell Gortney: Rory Percy Other Clinician: Referring Dacen Frayre: Treating Oliverio Cho/Extender: Lonell Face in Treatment: 1 Vital Signs Time Taken: 11:51 Temperature (F): 97.7 Height (in): 64 Pulse (bpm): 67 Weight (lbs): 178 Respiratory Rate (breaths/min): 16 Body Mass Index (BMI): 30.6 Blood Pressure (mmHg): 134/74 Reference Range: 80 - 120 mg / dl Electronic Signature(s) Signed: 02/13/2022 5:13:25 PM By: Rosalio Loud MSN RN CNS WTA Entered By: Rosalio Loud on 02/13/2022 12:11:40

## 2022-02-13 NOTE — Progress Notes (Addendum)
Kathryn, [name] ([id number]) [id number] Page 1 of 6 Visit Report for 02/13/2022 Chief Complaint Document Details Patient Name: Date of Service: Kathryn [name], [place] Kathryn L. 02/13/2022 11:30 A M Medical Record Number: [id number] Patient Account Number: [id number] Date of Birth/Sex: Treating RN: 1933-07-28 (88 y.o. Kathryn [name] Primary Care Provider: Rory Percy Other Clinician: Referring Provider: Treating Provider/Extender: Lonell Face in Treatment: 1 Information Obtained from: Patient Chief Complaint Bilateral foot ulcers and right LE Ulcer Electronic Signature(s) Signed: 02/13/2022 11:09:42 AM By: Worthy Keeler PA-C Entered By: Worthy Keeler on 02/13/2022 11:09:41 -------------------------------------------------------------------------------- HPI Details Patient Name: Date of Service: Kathryn [name], DEA Kathryn L. 02/13/2022 11:30 A M Medical Record Number: [id number] Patient Account Number: [id number] Date of Birth/Sex: Treating RN: 02-16-1934 (88 y.o. Kathryn [name] Primary Care Provider: Rory Percy Other Clinician: Referring Provider: Treating Provider/Extender: Lonell Face in Treatment: 1 History of Present Illness HPI Description: 02-06-2022 upon evaluation today patient presents for initial inspection here in our clinic concerning wounds that she has over the bilateral feet as well as the right leg. She has been tolerating the dressing changes in general here without complication she has been putting antibiotic ointment on this. With that being said I do believe that she would benefit from the use of something like Xeroform gauze dressings which would likely be a bit better. Fortunately I do not see any signs of infection which is good news do not think she needs any antibiotics. She tells me that these wounds typically occur as a result of her bumping or hitting her leg and then they slowly progressed  from there. Patient does have a history of diabetes for which her hemoglobin A1c was 6.9 measured on 12-12-2021. She also has a history of arterial insufficiency with a right ABI of 0.66 with a TBI of 0.61 and a left ABI of 0.82 with a TBI of 0.45. She has a follow-up evaluation on 02-27-2022 with vascular. This testing was done on 01-28-2022. Subsequently the patient also has a history of hypertension, generalized weakness, congestive heart failure, and lower extremity edema. 02-13-2022 upon evaluation today patient appears to be doing well currently in regard to her wounds. She is actually making some good progress here which is great news and overall I am extremely pleased with where we stand. I do not see any evidence of worsening. Electronic Signature(s) Signed: 02/13/2022 1:06:47 PM By: Ashley Royalty, Diana L ([id number]) PM By: Worthy Keeler PA-C [phone].pdf Page 2 of 6 Signed: 02/13/2022 1:06:47 Entered By: Worthy Keeler on 02/13/2022 13:06:46 -------------------------------------------------------------------------------- Physical Exam Details Patient Name: Date of Service: Kathryn [name] Kathryn L. 02/13/2022 11:30 A M Medical Record Number: [id number] Patient Account Number: [id number] Date of Birth/Sex: Treating RN: 07-Sep-1933 (88 y.o. Kathryn [name] Primary Care Provider: Rory Percy Other Clinician: Referring Provider: Treating Provider/Extender: Lonell Face in Treatment: 1 Constitutional Well-nourished and well-hydrated in no acute distress. Respiratory normal breathing without difficulty. Psychiatric this patient is able to make decisions and demonstrates good insight into disease process. Alert and Oriented x 3. pleasant and cooperative. Notes Upon inspection patient's wound bed actually showed signs of good granulation epithelization pretty much across the board. I am very pleased with the progress being  made and I think she is on the right track here. Overall I do not see any signs that we need to perform any sharp debridement today. Also do not see any signs that there  is any worsening overall which is also good news. Electronic Signature(s) Signed: 02/13/2022 1:07:14 PM By: Worthy Keeler PA-C Entered By: Worthy Keeler on 02/13/2022 13:07:14 -------------------------------------------------------------------------------- Physician Orders Details Patient Name: Date of Service: Kathryn [name], DEA Kathryn L. 02/13/2022 11:30 A M Medical Record Number: [id number] Patient Account Number: [id number] Date of Birth/Sex: Treating RN: [birth year] (88 y.o. Kathryn [name] Primary Care Provider: Rory Percy Other Clinician: Referring Provider: Treating Provider/Extender: Lonell Face in Treatment: 1 Verbal / Phone Orders: No Diagnosis Coding ICD-10 Coding Code Description E11.621 Type 2 diabetes mellitus with foot ulcer I73.89 Other specified peripheral vascular diseases L97.522 Non-pressure chronic ulcer of other part of left foot with fat layer exposed [place], [place] ([id number]) [id number] Page 3 of 6 L97.512 Non-pressure chronic ulcer of other part of right foot with fat layer exposed L97.812 Non-pressure chronic ulcer of other part of right lower leg with fat layer exposed M62.81 Muscle weakness (generalized) I50.42 Chronic combined systolic (congestive) and diastolic (congestive) heart failure I10 Essential (primary) hypertension Wound Treatment Electronic Signature(s) Signed: 02/13/2022 5:13:25 PM By: Rosalio Loud MSN RN CNS WTA Signed: 02/13/2022 5:41:33 PM By: Worthy Keeler PA-C Entered By: Rosalio Loud on 02/13/2022 12:16:55 -------------------------------------------------------------------------------- Problem List Details Patient Name: Date of Service: Kathryn [name], DEA Kathryn L. 02/13/2022 11:30 A M Medical Record Number:  [id number] Patient Account Number: [id number] Date of Birth/Sex: Treating RN: 02-29-1934 (88 y.o. Kathryn [name] Primary Care Provider: Rory Percy Other Clinician: Referring Provider: Treating Provider/Extender: Lonell Face in Treatment: 1 Active Problems ICD-10 Encounter Code Description Active Date MDM Diagnosis E11.621 Type 2 diabetes mellitus with foot ulcer 02/06/2022 No Yes I73.89 Other specified peripheral vascular diseases 02/06/2022 No Yes L97.522 Non-pressure chronic ulcer of other part of left foot with fat layer exposed 02/06/2022 No Yes L97.512 Non-pressure chronic ulcer of other part of right foot with fat layer exposed 02/06/2022 No Yes L97.812 Non-pressure chronic ulcer of other part of right lower leg with fat layer 02/06/2022 No Yes exposed M62.81 Muscle weakness (generalized) 02/06/2022 No Yes I50.42 Chronic combined systolic (congestive) and diastolic (congestive) heart failure 02/06/2022 No Yes I10 Essential (primary) hypertension 02/06/2022 No Yes [name], Kathryn L ([id number]) [id number] Page 4 of 6 Inactive Problems Resolved Problems Electronic Signature(s) Signed: 02/13/2022 5:13:25 PM By: Rosalio Loud MSN RN CNS WTA Signed: 02/13/2022 5:41:33 PM By: Worthy Keeler PA-C Previous Signature: 02/13/2022 11:09:33 AM Version By: Worthy Keeler PA-C Entered By: Rosalio Loud on 02/13/2022 12:18:37 -------------------------------------------------------------------------------- Progress Note Details Patient Name: Date of Service: Kathryn [name], DEA Kathryn L. 02/13/2022 11:30 A M Medical Record Number: [id number] Patient Account Number: [id number] Date of Birth/Sex: Treating RN: 20-Mar-1934 (88 y.o. Kathryn [name] Primary Care Provider: Rory Percy Other Clinician: Referring Provider: Treating Provider/Extender: Lonell Face in Treatment: 1 Subjective Chief Complaint Information obtained  from Patient Bilateral foot ulcers and right LE Ulcer History of Present Illness (HPI) 02-06-2022 upon evaluation today patient presents for initial inspection here in our clinic concerning wounds that she has over the bilateral feet as well as the right leg. She has been tolerating the dressing changes in general here without complication she has been putting antibiotic ointment on this. With that being said I do believe that she would benefit from the use of something like Xeroform gauze dressings which would likely be a bit better. Fortunately I do not see any signs of infection which is good news do not think she needs any antibiotics.  She tells me that these wounds typically occur as a result of her bumping or hitting her leg and then they slowly progressed from there. Patient does have a history of diabetes for which her hemoglobin A1c was 6.9 measured on 12-12-2021. She also has a history of arterial insufficiency with a right ABI of 0.66 with a TBI of 0.61 and a left ABI of 0.82 with a TBI of 0.45. She has a follow-up evaluation on 02-27-2022 with vascular. This testing was done on 01-28-2022. Subsequently the patient also has a history of hypertension, generalized weakness, congestive heart failure, and lower extremity edema. 02-13-2022 upon evaluation today patient appears to be doing well currently in regard to her wounds. She is actually making some good progress here which is great news and overall I am extremely pleased with where we stand. I do not see any evidence of worsening. Objective Constitutional Well-nourished and well-hydrated in no acute distress. Vitals Time Taken: 11:51 AM, Height: 64 in, Weight: 178 lbs, BMI: 30.6, Temperature: 97.7 F, Pulse: 67 bpm, Respiratory Rate: 16 breaths/min, Blood Pressure: 134/74 mmHg. Respiratory normal breathing without difficulty. Psychiatric this patient is able to make decisions and demonstrates good insight into disease process. Alert  and Oriented x 3. pleasant and cooperative. General Notes: Upon inspection patient's wound bed actually showed signs of good granulation epithelization pretty much across the board. I am very pleased with the progress being made and I think she is on the right track here. Overall I do not see any signs that we need to perform any sharp debridement today. Also do not see any signs that there is any worsening overall which is also good news. Kathryn, [name] ([id number]) [id number] Page 5 of 6 Integumentary (Hair, Skin) Wound #1 status is Open. Original cause of wound was Trauma. The date acquired was: 01/22/2022. The wound has been in treatment 1 weeks. The wound is located on the Left T Fourth. The wound measures 0.5cm length x 0.6cm width x 0.1cm depth; 0.236cm^2 area and 0.024cm^3 volume. There is Fat Layer oe (Subcutaneous Tissue) exposed. There is a medium amount of serous drainage noted. There is no granulation within the wound bed. There is a large (67-100%) amount of necrotic tissue within the wound bed including Adherent Slough. Wound #2 status is Open. Original cause of wound was Gradually Appeared. The date acquired was: 01/22/2022. The wound has been in treatment 1 weeks. The wound is located on the Left,Lateral Foot. The wound measures 0.5cm length x 0.3cm width x 0.1cm depth; 0.118cm^2 area and 0.012cm^3 volume. There is Fat Layer (Subcutaneous Tissue) exposed. There is a medium amount of serous drainage noted. There is no granulation within the wound bed. There is a large (67-100%) amount of necrotic tissue within the wound bed including Adherent Slough. Wound #3 status is Open. Original cause of wound was Gradually Appeared. The date acquired was: 01/22/2022. The wound has been in treatment 1 weeks. The wound is located on the Right,Lateral Lower Leg. The wound measures 0.5cm length x 0.5cm width x 0.1cm depth; 0.196cm^2 area and 0.02cm^3 volume. There is  Fat Layer (Subcutaneous Tissue) exposed. There is a medium amount of serous drainage noted. There is no granulation within the wound bed. There is a large (67-100%) amount of necrotic tissue within the wound bed including Eschar. Wound #4 status is Open. Original cause of wound was Gradually Appeared. The date acquired was: 01/15/2022. The wound has been in treatment 1 weeks. The wound is located on the Right,Proximal  Lower Leg. The wound measures 0.5cm length x 0.5cm width x 0.1cm depth; 0.196cm^2 area and 0.02cm^3 volume. There is Fat Layer (Subcutaneous Tissue) exposed. There is a medium amount of serous drainage noted. There is small (1-33%) granulation within the wound bed. There is a large (67-100%) amount of necrotic tissue within the wound bed. Wound #5 status is Open. Original cause of wound was Gradually Appeared. The date acquired was: 01/15/2022. The wound has been in treatment 1 weeks. The wound is located on the Right,Distal,Posterior Lower Leg. The wound measures 0.8cm length x 0.5cm width x 0.2cm depth; 0.314cm^2 area and 0.063cm^3 volume. There is Fat Layer (Subcutaneous Tissue) exposed. There is a medium amount of serous drainage noted. There is no granulation within the wound bed. There is a large (67-100%) amount of necrotic tissue within the wound bed including Adherent Slough. Wound #6 status is Open. Original cause of wound was Gradually Appeared. The date acquired was: 12/18/2021. The wound has been in treatment 1 weeks. The wound is located on the Right,Proximal,Posterior Lower Leg. The wound measures 0.3cm length x 0.4cm width x 0.1cm depth; 0.094cm^2 area and 0.009cm^3 volume. There is Fat Layer (Subcutaneous Tissue) exposed. There is a medium amount of serous drainage noted. There is no granulation within the wound bed. There is a large (67-100%) amount of necrotic tissue within the wound bed including Adherent Slough. Wound #7 status is Open. Original cause of wound was  Gradually Appeared. The date acquired was: 01/15/2022. The wound has been in treatment 1 weeks. The wound is located on the Right T Second. The wound measures 0.4cm length x 0.4cm width x 0.1cm depth; 0.126cm^2 area and 0.013cm^3 volume. There oe is Fat Layer (Subcutaneous Tissue) exposed. There is a medium amount of serous drainage noted. There is no granulation within the wound bed. There is a large (67-100%) amount of necrotic tissue within the wound bed including Adherent Slough. Assessment Active Problems ICD-10 Type 2 diabetes mellitus with foot ulcer Other specified peripheral vascular diseases Non-pressure chronic ulcer of other part of left foot with fat layer exposed Non-pressure chronic ulcer of other part of right foot with fat layer exposed Non-pressure chronic ulcer of other part of right lower leg with fat layer exposed Muscle weakness (generalized) Chronic combined systolic (congestive) and diastolic (congestive) heart failure Essential (primary) hypertension Plan 1. I am good recommend that we have the patient go ahead and continue to monitor for any signs of worsening infection. Office if anything changes she knows to contact the office and let me know. 2. Also can recommend that we have the patient continue with the Xeroform gauze particularly which I think is doing a good job here for her. 3. I am also going to recommend she continue to cover this with dressings I think she needs to keep them covered until she comes back and we will see where things stand some of these areas I think will be healed by that time. We will see patient back for reevaluation in 1 week here in the clinic. If anything worsens or changes patient will contact our office for additional recommendations. Electronic Signature(s) Signed: 02/13/2022 1:07:54 PM By: Worthy Keeler PA-C Entered By: Worthy Keeler on 02/13/2022 13:07:54 Kathryn [name] ([id number]) [id number]  Page 6 of 6 -------------------------------------------------------------------------------- SuperBill Details Patient Name: Date of Service: Kathryn [name] Kathryn L. 02/13/2022 Medical Record Number: [id number] Patient Account Number: [id number] Date of Birth/Sex: Treating RN: 08-29-1933 (88 y.o. Kathryn [name] Primary Care Provider: Ky Barban,  Bryson Ha Other Clinician: Referring Provider: Treating Provider/Extender: Lonell Face in Treatment: 1 Diagnosis Coding ICD-10 Codes Code Description E11.621 Type 2 diabetes mellitus with foot ulcer I73.89 Other specified peripheral vascular diseases L97.522 Non-pressure chronic ulcer of other part of left foot with fat layer exposed L97.512 Non-pressure chronic ulcer of other part of right foot with fat layer exposed L97.812 Non-pressure chronic ulcer of other part of right lower leg with fat layer exposed M62.81 Muscle weakness (generalized) I50.42 Chronic combined systolic (congestive) and diastolic (congestive) heart failure I10 Essential (primary) hypertension Facility Procedures : CPT4 Code: [id number] Description: 62376 - WOUND CARE VISIT-LEV 5 EST PT Modifier: Quantity: 1 Physician Procedures : CPT4 Code Description Modifier [id number] - WC PHYS LEVEL 3 - EST PT ICD-10 Diagnosis Description E11.621 Type 2 diabetes mellitus with foot ulcer I73.89 Other specified peripheral vascular diseases L97.522 Non-pressure chronic ulcer of other part  of left foot with fat layer exposed L97.512 Non-pressure chronic ulcer of other part of right foot with fat layer exposed Quantity: 1 Electronic Signature(s) Signed: 02/13/2022 1:08:08 PM By: Worthy Keeler PA-C Entered By: Worthy Keeler on 02/13/2022 13:08:07

## 2022-02-22 ENCOUNTER — Encounter: Payer: Medicare Other | Admitting: Physician Assistant

## 2022-02-22 DIAGNOSIS — L97522 Non-pressure chronic ulcer of other part of left foot with fat layer exposed: Secondary | ICD-10-CM | POA: Diagnosis not present

## 2022-02-22 DIAGNOSIS — L97822 Non-pressure chronic ulcer of other part of left lower leg with fat layer exposed: Secondary | ICD-10-CM | POA: Diagnosis not present

## 2022-02-22 DIAGNOSIS — L97212 Non-pressure chronic ulcer of right calf with fat layer exposed: Secondary | ICD-10-CM | POA: Diagnosis not present

## 2022-02-22 DIAGNOSIS — E1151 Type 2 diabetes mellitus with diabetic peripheral angiopathy without gangrene: Secondary | ICD-10-CM | POA: Diagnosis not present

## 2022-02-22 DIAGNOSIS — M069 Rheumatoid arthritis, unspecified: Secondary | ICD-10-CM | POA: Diagnosis not present

## 2022-02-22 DIAGNOSIS — E11621 Type 2 diabetes mellitus with foot ulcer: Secondary | ICD-10-CM | POA: Diagnosis not present

## 2022-02-22 DIAGNOSIS — L97512 Non-pressure chronic ulcer of other part of right foot with fat layer exposed: Secondary | ICD-10-CM | POA: Diagnosis not present

## 2022-02-22 DIAGNOSIS — L97812 Non-pressure chronic ulcer of other part of right lower leg with fat layer exposed: Secondary | ICD-10-CM | POA: Diagnosis not present

## 2022-02-22 NOTE — Progress Notes (Addendum)
[name], [name] ([id number]) [id number] Page 1 of 9 Visit Report for 02/22/2022 Chief Complaint Document Details Patient Name: Date of Service: Kathryn [name] All NNA L. 02/22/2022 3:15 PM Medical Record Number: [id number] Patient Account Number: [id number] Date of Birth/Sex: Treating RN: 1933/12/26 (88 y.o. [name] Primary Care Provider: Rory Percy Other Clinician: Referring Provider: Treating Provider/Extender: Lonell Face in Treatment: 2 Information Obtained from: Patient Chief Complaint Bilateral foot ulcers and right LE Ulcer Electronic Signature(s) Signed: 02/22/2022 4:39:37 PM By: Rosalio Loud MSN RN CNS WTA Signed: 02/22/2022 5:46:39 PM By: Worthy Keeler PA-C Previous Signature: 02/22/2022 3:17:11 PM Version By: Worthy Keeler PA-C Entered By: Rosalio Loud on 02/22/2022 16:39:37 -------------------------------------------------------------------------------- HPI Details Patient Name: Date of Service: Kathryn [name] [name], Kathryn [name] NNA L. 02/22/2022 3:15 PM Medical Record Number: [id number] Patient Account Number: [id number] Date of Birth/Sex: Treating RN: 1933-10-26 (88 y.o. [name] Primary Care Provider: Rory Percy Other Clinician: Referring Provider: Treating Provider/Extender: Lonell Face in Treatment: 2 History of Present Illness HPI Description: 02-06-2022 upon evaluation today patient presents for initial inspection here in our clinic concerning wounds that she has over the bilateral feet as well as the right leg. She has been tolerating the dressing changes in general here without complication she has been putting antibiotic ointment on this. With that being said I do believe that she would benefit from the use of something like Xeroform gauze dressings which would likely be a bit better. Fortunately I do not see any signs of infection which is good news do not think she needs any  antibiotics. She tells me that these wounds typically occur as a result of her bumping or hitting her leg and then they slowly progressed from there. Patient does have a history of diabetes for which her hemoglobin A1c was 6.9 measured on 12-12-2021. She also has a history of arterial insufficiency with a right ABI of 0.66 with a TBI of 0.61 and a left ABI of 0.82 with a TBI of 0.45. She has a follow-up evaluation on 02-27-2022 with vascular. This testing was done on 01-28-2022. Subsequently the patient also has a history of hypertension, generalized weakness, congestive heart failure, and lower extremity edema. 02-13-2022 upon evaluation today patient appears to be doing well currently in regard to her wounds. She is actually making some good progress here which is great news and overall I am extremely pleased with where we stand. I do not see any evidence of worsening. 02-22-2022 upon evaluation today patient's wounds actually are showing some signs of improvement for the most part. Fortunately there does not appear to be any evidence of infection locally nor systemically at this point which is great news. No fevers, chills, nausea, vomiting, or diarrhea. She does have an a TIMA, CURET L ([id number]) [id number] Page 2 of 9 vascular appointment on December 5 and we will see what they have to say at that point. Electronic Signature(s) Signed: 02/22/2022 5:02:04 PM By: Worthy Keeler PA-C Entered By: Worthy Keeler on 02/22/2022 17:02:03 -------------------------------------------------------------------------------- Physical Exam Details Patient Name: Date of Service: Kathryn [name] [name], Kathryn [name] NNA L. 02/22/2022 3:15 PM Medical Record Number: [id number] Patient Account Number: [id number] Date of Birth/Sex: Treating RN: February 01, 1934 (88 y.o. [name] Primary Care Provider: Rory Percy Other Clinician: Referring Provider: Treating Provider/Extender: Lonell Face in Treatment: 2 Constitutional Well-nourished and well-hydrated in no acute distress. Respiratory normal breathing without difficulty. Psychiatric this patient is able to make decisions  and demonstrates good insight into disease process. Alert and Oriented x 3. pleasant and cooperative. Notes Upon inspection patient's wound bed actually showed signs pretty much across the board of doing better except for the toes on the left and right foot. Both are still showing signs of lagging a little bit behind though there may be some improvement I am still not 100% happy with what we are seeing overall. Electronic Signature(s) Signed: 02/22/2022 5:02:23 PM By: Worthy Keeler PA-C Entered By: Worthy Keeler on 02/22/2022 17:02:22 -------------------------------------------------------------------------------- Physician Orders Details Patient Name: Date of Service: Kathryn [name] [name], Kathryn [name] NNA L. 02/22/2022 3:15 PM Medical Record Number: [id number] Patient Account Number: [id number] Date of Birth/Sex: Treating RN: 05/12/1933 (88 y.o. [name] Primary Care Provider: Rory Percy Other Clinician: Referring Provider: Treating Provider/Extender: Lonell Face in Treatment: 2 Verbal / Phone Orders: No Diagnosis Coding ICD-10 Coding Code Description [name], [name] ([id number]) [id number] Page 3 of 9 E11.621 Type 2 diabetes mellitus with foot ulcer I73.89 Other specified peripheral vascular diseases L97.522 Non-pressure chronic ulcer of other part of left foot with fat layer exposed L97.512 Non-pressure chronic ulcer of other part of right foot with fat layer exposed L97.812 Non-pressure chronic ulcer of other part of right lower leg with fat layer exposed M62.81 Muscle weakness (generalized) I50.42 Chronic combined systolic (congestive) and diastolic (congestive) heart failure I10 Essential (primary) hypertension Follow-up  Appointments Return Appointment in 1 week. Bathing/ Shower/ Hygiene May shower; gently cleanse wound with antibacterial soap, rinse and pat dry prior to dressing wounds Anesthetic (Use 'Patient Medications' Section for Anesthetic Order Entry) Lidocaine applied to wound bed - In clinic only Edema Control - Lymphedema / Segmental Compressive Device / Other Elevate, Exercise Daily and A void Standing for Long Periods of Time. Elevate legs to the level of the heart and pump ankles as often as possible Elevate leg(s) parallel to the floor when sitting. DO YOUR BEST to sleep in the bed at night. DO NOT sleep in your recliner. Long hours of sitting in a recliner leads to swelling of the legs and/or potential wounds on your backside. Wound Treatment Wound #1 - T Fourth oe Wound Laterality: Left Cleanser: Soap and Water 1 x Per Day/15 Days Discharge Instructions: Gently cleanse wound with antibacterial soap, rinse and pat dry prior to dressing wounds Prim Dressing: Xeroform-HBD 2x2 (in/in) (Generic) 1 x Per Day/15 Days ary Discharge Instructions: Apply Xeroform-HBD 2x2 (in/in) as directed Secondary Dressing: Coverlet Latex-Free Fabric Adhesive Dressings (Generic) 1 x Per Day/15 Days Discharge Instructions: 1.5 x 2 Secured With: Tubigrip Size D, 3x10 (in/yd) 1 x Per Day/15 Days Discharge Instructions: single Wound #2 - Foot Wound Laterality: Left, Lateral Cleanser: Soap and Water 1 x Per Day/15 Days Discharge Instructions: Gently cleanse wound with antibacterial soap, rinse and pat dry prior to dressing wounds Prim Dressing: Xeroform-HBD 2x2 (in/in) (Generic) 1 x Per Day/15 Days ary Discharge Instructions: Apply Xeroform-HBD 2x2 (in/in) as directed Secondary Dressing: Coverlet Latex-Free Fabric Adhesive Dressings (Generic) 1 x Per Day/15 Days Discharge Instructions: 1.5 x 2 Secured With: Tubigrip Size D, 3x10 (in/yd) 1 x Per Day/15 Days Discharge Instructions: single Wound #3 - Lower Leg  Wound Laterality: Right, Lateral Cleanser: Soap and Water 1 x Per Day/15 Days Discharge Instructions: Gently cleanse wound with antibacterial soap, rinse and pat dry prior to dressing wounds Prim Dressing: Xeroform-HBD 2x2 (in/in) (Generic) 1 x Per Day/15 Days ary Discharge Instructions: Apply Xeroform-HBD 2x2 (in/in) as directed Secondary Dressing: Coverlet Latex-Free Fabric  Adhesive Dressings (Generic) 1 x Per Day/15 Days Discharge Instructions: 1.5 x 2 Secured With: Tubigrip Size D, 3x10 (in/yd) 1 x Per Day/15 Days Discharge Instructions: single Wound #4 - Lower Leg Wound Laterality: Right, Proximal Cleanser: Soap and Water 1 x Per Day/15 Days Discharge Instructions: Gently cleanse wound with antibacterial soap, rinse and pat dry prior to dressing wounds Prim Dressing: Xeroform-HBD 2x2 (in/in) (Generic) 1 x Per Day/15 Days ary Discharge Instructions: Apply Xeroform-HBD 2x2 (in/in) as directed Secondary Dressing: Coverlet Latex-Free Fabric Adhesive Dressings (Generic) 1 x Per CXK/48 Days Discharge Instructions: 1.5 x 2 Stash, Marne L ([id number]) [id number] Page 4 of 9 Secured With: Tubigrip Size D, 3x10 (in/yd) 1 x Per Day/15 Days Discharge Instructions: single Wound #5 - Lower Leg Wound Laterality: Right, Posterior, Distal Cleanser: Soap and Water 1 x Per Day/15 Days Discharge Instructions: Gently cleanse wound with antibacterial soap, rinse and pat dry prior to dressing wounds Prim Dressing: Xeroform-HBD 2x2 (in/in) (Generic) 1 x Per Day/15 Days ary Discharge Instructions: Apply Xeroform-HBD 2x2 (in/in) as directed Secondary Dressing: Coverlet Latex-Free Fabric Adhesive Dressings (Generic) 1 x Per Day/15 Days Discharge Instructions: 1.5 x 2 Secured With: Tubigrip Size D, 3x10 (in/yd) 1 x Per Day/15 Days Discharge Instructions: single Wound #6 - Lower Leg Wound Laterality: Right, Posterior, Proximal Cleanser: Soap and Water 1 x Per Day/15  Days Discharge Instructions: Gently cleanse wound with antibacterial soap, rinse and pat dry prior to dressing wounds Prim Dressing: Xeroform-HBD 2x2 (in/in) (Generic) 1 x Per Day/15 Days ary Discharge Instructions: Apply Xeroform-HBD 2x2 (in/in) as directed Secondary Dressing: Coverlet Latex-Free Fabric Adhesive Dressings (Generic) 1 x Per Day/15 Days Discharge Instructions: 1.5 x 2 Secured With: Tubigrip Size D, 3x10 (in/yd) 1 x Per Day/15 Days Discharge Instructions: single Wound #7 - T Second oe Wound Laterality: Right Cleanser: Soap and Water 1 x Per Day/15 Days Discharge Instructions: Gently cleanse wound with antibacterial soap, rinse and pat dry prior to dressing wounds Prim Dressing: Xeroform-HBD 2x2 (in/in) (Generic) 1 x Per Day/15 Days ary Discharge Instructions: Apply Xeroform-HBD 2x2 (in/in) as directed Secondary Dressing: Coverlet Latex-Free Fabric Adhesive Dressings (Generic) 1 x Per Day/15 Days Discharge Instructions: 1.5 x 2 Secured With: Tubigrip Size D, 3x10 (in/yd) 1 x Per Day/15 Days Discharge Instructions: single Wound #8 - Lower Leg Wound Laterality: Left, Medial Cleanser: Soap and Water 1 x Per Day/15 Days Discharge Instructions: Gently cleanse wound with antibacterial soap, rinse and pat dry prior to dressing wounds Prim Dressing: Xeroform-HBD 2x2 (in/in) (Generic) 1 x Per Day/15 Days ary Discharge Instructions: Apply Xeroform-HBD 2x2 (in/in) as directed Secondary Dressing: Coverlet Latex-Free Fabric Adhesive Dressings (Generic) 1 x Per Day/15 Days Discharge Instructions: 1.5 x 2 Secured With: Tubigrip Size D, 3x10 (in/yd) 1 x Per Day/15 Days Discharge Instructions: single Electronic Signature(s) Signed: 02/22/2022 4:40:58 PM By: Rosalio Loud MSN RN CNS WTA Signed: 02/22/2022 5:46:39 PM By: Worthy Keeler PA-C Entered By: Rosalio Loud on 02/22/2022 16:38:58 Kathryn [name] [name], Kathryn [name] [name] ([id number]) [id number] Page 5 of  9 -------------------------------------------------------------------------------- Problem List Details Patient Name: Date of Service: Kathryn [name] All NNA L. 02/22/2022 3:15 PM Medical Record Number: [id number] Patient Account Number: [id number] Date of Birth/Sex: Treating RN: Apr 30, 1933 (87 y.o. [name] Primary Care Provider: Rory Percy Other Clinician: Referring Provider: Treating Provider/Extender: Lonell Face in Treatment: 2 Active Problems ICD-10 Encounter Code Description Active Date MDM Diagnosis E11.621 Type 2 diabetes mellitus with foot ulcer 02/06/2022 No Yes I73.89 Other specified peripheral vascular diseases 02/06/2022 No Yes L97.522  Non-pressure chronic ulcer of other part of left foot with fat layer exposed 02/06/2022 No Yes L97.512 Non-pressure chronic ulcer of other part of right foot with fat layer exposed 02/06/2022 No Yes L97.812 Non-pressure chronic ulcer of other part of right lower leg with fat layer 02/06/2022 No Yes exposed M62.81 Muscle weakness (generalized) 02/06/2022 No Yes I50.42 Chronic combined systolic (congestive) and diastolic (congestive) heart failure 02/06/2022 No Yes I10 Essential (primary) hypertension 02/06/2022 No Yes Inactive Problems Resolved Problems Electronic Signature(s) Signed: 02/22/2022 4:39:30 PM By: Rosalio Loud MSN RN CNS WTA Signed: 02/22/2022 5:46:39 PM By: Worthy Keeler PA-C Previous Signature: 02/22/2022 3:16:55 PM Version By: Worthy Keeler PA-C Entered By: Rosalio Loud on 02/22/2022 16:39:30 Mikes, Kathryn [name] [name] ([id number]) [id number] Page 6 of 9 -------------------------------------------------------------------------------- Progress Note Details Patient Name: Date of Service: Kathryn [name] All NNA L. 02/22/2022 3:15 PM Medical Record Number: [id number] Patient Account Number: [id number] Date of Birth/Sex: Treating RN: [birth year] (88 y.o. [name] Primary  Care Provider: Rory Percy Other Clinician: Referring Provider: Treating Provider/Extender: Lonell Face in Treatment: 2 Subjective Chief Complaint Information obtained from Patient Bilateral foot ulcers and right LE Ulcer History of Present Illness (HPI) 02-06-2022 upon evaluation today patient presents for initial inspection here in our clinic concerning wounds that she has over the bilateral feet as well as the right leg. She has been tolerating the dressing changes in general here without complication she has been putting antibiotic ointment on this. With that being said I do believe that she would benefit from the use of something like Xeroform gauze dressings which would likely be a bit better. Fortunately I do not see any signs of infection which is good news do not think she needs any antibiotics. She tells me that these wounds typically occur as a result of her bumping or hitting her leg and then they slowly progressed from there. Patient does have a history of diabetes for which her hemoglobin A1c was 6.9 measured on 12-12-2021. She also has a history of arterial insufficiency with a right ABI of 0.66 with a TBI of 0.61 and a left ABI of 0.82 with a TBI of 0.45. She has a follow-up evaluation on 02-27-2022 with vascular. This testing was done on 01-28-2022. Subsequently the patient also has a history of hypertension, generalized weakness, congestive heart failure, and lower extremity edema. 02-13-2022 upon evaluation today patient appears to be doing well currently in regard to her wounds. She is actually making some good progress here which is great news and overall I am extremely pleased with where we stand. I do not see any evidence of worsening. 02-22-2022 upon evaluation today patient's wounds actually are showing some signs of improvement for the most part. Fortunately there does not appear to be any evidence of infection locally nor systemically at this  point which is great news. No fevers, chills, nausea, vomiting, or diarrhea. She does have an a vascular appointment on December 5 and we will see what they have to say at that point. Objective Constitutional Well-nourished and well-hydrated in no acute distress. Vitals Time Taken: 3:28 PM, Height: 64 in, Weight: 178 lbs, BMI: 30.6, Temperature: 97.7 F, Pulse: 68 bpm, Respiratory Rate: 18 breaths/min, Blood Pressure: 147/63 mmHg. Respiratory normal breathing without difficulty. Psychiatric this patient is able to make decisions and demonstrates good insight into disease process. Alert and Oriented x 3. pleasant and cooperative. General Notes: Upon inspection patient's wound bed actually showed signs pretty much across the board of  doing better except for the toes on the left and right foot. Both are still showing signs of lagging a little bit behind though there may be some improvement I am still not 100% happy with what we are seeing overall. Integumentary (Hair, Skin) Wound #1 status is Open. Original cause of wound was Trauma. The date acquired was: 01/22/2022. The wound has been in treatment 2 weeks. The wound is located on the Left T Fourth. The wound measures 0.9cm length x 1cm width x 0.1cm depth; 0.707cm^2 area and 0.071cm^3 volume. There is Fat Layer oe (Subcutaneous Tissue) exposed. There is a medium amount of serous drainage noted. There is no granulation within the wound bed. There is a large (67-100%) amount of necrotic tissue within the wound bed including Adherent Slough. Wound #2 status is Open. Original cause of wound was Gradually Appeared. The date acquired was: 01/22/2022. The wound has been in treatment 2 weeks. The wound is located on the Left,Lateral Foot. The wound measures 0.5cm length x 0.3cm width x 0.1cm depth; 0.118cm^2 area and 0.012cm^3 volume. There is Fat Layer (Subcutaneous Tissue) exposed. There is a medium amount of serous drainage noted. There is no  granulation within the wound bed. There is a large (67-100%) amount of necrotic tissue within the wound bed including Adherent Slough. Kathryn [name] [name], Kathryn [name] [name] ([id number]) [id number] Page 7 of 9 Wound #3 status is Open. Original cause of wound was Gradually Appeared. The date acquired was: 01/22/2022. The wound has been in treatment 2 weeks. The wound is located on the Right,Lateral Lower Leg. The wound measures 0.4cm length x 0.3cm width x 0.1cm depth; 0.094cm^2 area and 0.009cm^3 volume. There is Fat Layer (Subcutaneous Tissue) exposed. There is a medium amount of serous drainage noted. There is no granulation within the wound bed. There is a large (67-100%) amount of necrotic tissue within the wound bed including Eschar. Wound #4 status is Open. Original cause of wound was Gradually Appeared. The date acquired was: 01/15/2022. The wound has been in treatment 2 weeks. The wound is located on the Right,Proximal Lower Leg. The wound measures 0.4cm length x 0.4cm width x 0.1cm depth; 0.126cm^2 area and 0.013cm^3 volume. There is Fat Layer (Subcutaneous Tissue) exposed. There is a medium amount of serous drainage noted. There is small (1-33%) granulation within the wound bed. There is a large (67-100%) amount of necrotic tissue within the wound bed. Wound #5 status is Open. Original cause of wound was Gradually Appeared. The date acquired was: 01/15/2022. The wound has been in treatment 2 weeks. The wound is located on the Right,Distal,Posterior Lower Leg. The wound measures 0.7cm length x 0.6cm width x 0.1cm depth; 0.33cm^2 area and 0.033cm^3 volume. There is Fat Layer (Subcutaneous Tissue) exposed. There is a medium amount of serous drainage noted. There is no granulation within the wound bed. There is a large (67-100%) amount of necrotic tissue within the wound bed including Adherent Slough. Wound #6 status is Open. Original cause of wound was Gradually Appeared. The date  acquired was: 12/18/2021. The wound has been in treatment 2 weeks. The wound is located on the Right,Proximal,Posterior Lower Leg. The wound measures 0.3cm length x 0.7cm width x 0.1cm depth; 0.165cm^2 area and 0.016cm^3 volume. There is Fat Layer (Subcutaneous Tissue) exposed. There is a medium amount of serous drainage noted. There is no granulation within the wound bed. There is a large (67-100%) amount of necrotic tissue within the wound bed including Adherent Slough. Wound #7 status is Open. Original cause of wound  was Gradually Appeared. The date acquired was: 01/15/2022. The wound has been in treatment 2 weeks. The wound is located on the Right T Second. The wound measures 0.4cm length x 0.6cm width x 0.1cm depth; 0.188cm^2 area and 0.019cm^3 volume. There oe is Fat Layer (Subcutaneous Tissue) exposed. There is a medium amount of serous drainage noted. There is no granulation within the wound bed. There is a large (67-100%) amount of necrotic tissue within the wound bed including Adherent Slough. Wound #8 status is Open. Original cause of wound was Laceration. The date acquired was: 02/04/2022. The wound is located on the Left,Medial Lower Leg. The wound measures 4.5cm length x 1cm width x 0.1cm depth; 3.534cm^2 area and 0.353cm^3 volume. There is Fat Layer (Subcutaneous Tissue) exposed. There is no tunneling or undermining noted. There is a small amount of serous drainage noted. There is small (1-33%) red granulation within the wound bed. There is no necrotic tissue within the wound bed. Assessment Active Problems ICD-10 Type 2 diabetes mellitus with foot ulcer Other specified peripheral vascular diseases Non-pressure chronic ulcer of other part of left foot with fat layer exposed Non-pressure chronic ulcer of other part of right foot with fat layer exposed Non-pressure chronic ulcer of other part of right lower leg with fat layer exposed Muscle weakness (generalized) Chronic combined  systolic (congestive) and diastolic (congestive) heart failure Essential (primary) hypertension Plan Follow-up Appointments: Return Appointment in 1 week. Bathing/ Shower/ Hygiene: May shower; gently cleanse wound with antibacterial soap, rinse and pat dry prior to dressing wounds Anesthetic (Use 'Patient Medications' Section for Anesthetic Order Entry): Lidocaine applied to wound bed - In clinic only Edema Control - Lymphedema / Segmental Compressive Device / Other: Elevate, Exercise Daily and Avoid Standing for Long Periods of Time. Elevate legs to the level of the heart and pump ankles as often as possible Elevate leg(s) parallel to the floor when sitting. DO YOUR BEST to sleep in the bed at night. DO NOT sleep in your recliner. Long hours of sitting in a recliner leads to swelling of the legs and/or potential wounds on your backside. WOUND #1: - T Fourth Wound Laterality: Left oe Cleanser: Soap and Water 1 x Per Day/15 Days Discharge Instructions: Gently cleanse wound with antibacterial soap, rinse and pat dry prior to dressing wounds Prim Dressing: Xeroform-HBD 2x2 (in/in) (Generic) 1 x Per Day/15 Days ary Discharge Instructions: Apply Xeroform-HBD 2x2 (in/in) as directed Secondary Dressing: Coverlet Latex-Free Fabric Adhesive Dressings (Generic) 1 x Per Day/15 Days Discharge Instructions: 1.5 x 2 Secured With: Tubigrip Size D, 3x10 (in/yd) 1 x Per Day/15 Days Discharge Instructions: single WOUND #2: - Foot Wound Laterality: Left, Lateral Cleanser: Soap and Water 1 x Per Day/15 Days Discharge Instructions: Gently cleanse wound with antibacterial soap, rinse and pat dry prior to dressing wounds Prim Dressing: Xeroform-HBD 2x2 (in/in) (Generic) 1 x Per Day/15 Days ary Discharge Instructions: Apply Xeroform-HBD 2x2 (in/in) as directed Secondary Dressing: Coverlet Latex-Free Fabric Adhesive Dressings (Generic) 1 x Per Day/15 Days Discharge Instructions: 1.5 x 2 Secured With:  Tubigrip Size D, 3x10 (in/yd) 1 x Per Day/15 Days Discharge Instructions: single WOUND #3: - Lower Leg Wound Laterality: Right, Lateral Cleanser: Soap and Water 1 x Per Day/15 Days Discharge Instructions: Gently cleanse wound with antibacterial soap, rinse and pat dry prior to dressing wounds Prim Dressing: Xeroform-HBD 2x2 (in/in) (Generic) 1 x Per Day/15 Days ary Discharge Instructions: Apply Xeroform-HBD 2x2 (in/in) as directed Secondary Dressing: Coverlet Latex-Free Fabric Adhesive Dressings (Generic) 1 x Per  Day/15 Days Kathryn [name] [name], Kathryn [name] [name] ([id number]) [id number] Page 8 of 9 Discharge Instructions: 1.5 x 2 Secured With: Tubigrip Size D, 3x10 (in/yd) 1 x Per Day/15 Days Discharge Instructions: single WOUND #4: - Lower Leg Wound Laterality: Right, Proximal Cleanser: Soap and Water 1 x Per Day/15 Days Discharge Instructions: Gently cleanse wound with antibacterial soap, rinse and pat dry prior to dressing wounds Prim Dressing: Xeroform-HBD 2x2 (in/in) (Generic) 1 x Per Day/15 Days ary Discharge Instructions: Apply Xeroform-HBD 2x2 (in/in) as directed Secondary Dressing: Coverlet Latex-Free Fabric Adhesive Dressings (Generic) 1 x Per Day/15 Days Discharge Instructions: 1.5 x 2 Secured With: Tubigrip Size D, 3x10 (in/yd) 1 x Per Day/15 Days Discharge Instructions: single WOUND #5: - Lower Leg Wound Laterality: Right, Posterior, Distal Cleanser: Soap and Water 1 x Per Day/15 Days Discharge Instructions: Gently cleanse wound with antibacterial soap, rinse and pat dry prior to dressing wounds Prim Dressing: Xeroform-HBD 2x2 (in/in) (Generic) 1 x Per Day/15 Days ary Discharge Instructions: Apply Xeroform-HBD 2x2 (in/in) as directed Secondary Dressing: Coverlet Latex-Free Fabric Adhesive Dressings (Generic) 1 x Per Day/15 Days Discharge Instructions: 1.5 x 2 Secured With: Tubigrip Size D, 3x10 (in/yd) 1 x Per Day/15 Days Discharge Instructions: single WOUND #6: -  Lower Leg Wound Laterality: Right, Posterior, Proximal Cleanser: Soap and Water 1 x Per Day/15 Days Discharge Instructions: Gently cleanse wound with antibacterial soap, rinse and pat dry prior to dressing wounds Prim Dressing: Xeroform-HBD 2x2 (in/in) (Generic) 1 x Per Day/15 Days ary Discharge Instructions: Apply Xeroform-HBD 2x2 (in/in) as directed Secondary Dressing: Coverlet Latex-Free Fabric Adhesive Dressings (Generic) 1 x Per Day/15 Days Discharge Instructions: 1.5 x 2 Secured With: Tubigrip Size D, 3x10 (in/yd) 1 x Per Day/15 Days Discharge Instructions: single WOUND #7: - T Second Wound Laterality: Right oe Cleanser: Soap and Water 1 x Per Day/15 Days Discharge Instructions: Gently cleanse wound with antibacterial soap, rinse and pat dry prior to dressing wounds Prim Dressing: Xeroform-HBD 2x2 (in/in) (Generic) 1 x Per Day/15 Days ary Discharge Instructions: Apply Xeroform-HBD 2x2 (in/in) as directed Secondary Dressing: Coverlet Latex-Free Fabric Adhesive Dressings (Generic) 1 x Per Day/15 Days Discharge Instructions: 1.5 x 2 Secured With: Tubigrip Size D, 3x10 (in/yd) 1 x Per Day/15 Days Discharge Instructions: single WOUND #8: - Lower Leg Wound Laterality: Left, Medial Cleanser: Soap and Water 1 x Per Day/15 Days Discharge Instructions: Gently cleanse wound with antibacterial soap, rinse and pat dry prior to dressing wounds Prim Dressing: Xeroform-HBD 2x2 (in/in) (Generic) 1 x Per Day/15 Days ary Discharge Instructions: Apply Xeroform-HBD 2x2 (in/in) as directed Secondary Dressing: Coverlet Latex-Free Fabric Adhesive Dressings (Generic) 1 x Per Day/15 Days Discharge Instructions: 1.5 x 2 Secured With: Tubigrip Size D, 3x10 (in/yd) 1 x Per Day/15 Days Discharge Instructions: single 1. Based on what I see I do believe that the patient would benefit from a continuation of therapy with the Xeroform gauze dressing which I think is doing a decently good job here. 2. I am going  to recommend as well that we continue with the roll gauze to secure in place along with ABD pads to the open areas followed by Tubigrip size D single-layer to help with some of the edema control. 3. I would also recommend the patient continue to monitor for any signs of worsening or infection when it comes to the toes. Obviously we want to see things continue to improve right now there is still some work to be done in this regard. We will see patient back for reevaluation in 1 week  here in the clinic. If anything worsens or changes patient will contact our office for additional recommendations. Electronic Signature(s) Signed: 02/22/2022 5:03:39 PM By: Worthy Keeler PA-C Entered By: Worthy Keeler on 02/22/2022 17:03:39 -------------------------------------------------------------------------------- SuperBill Details Patient Name: Date of Service: Kathryn [name] [name], Kathryn [name] NNA L. 02/22/2022 Medical Record Number: [id number] Patient Account Number: [id number] Date of Birth/Sex: Treating RN: 1933-11-13 (88 y.o. [name] Primary Care Provider: Rory Percy Other Clinician: HANAN, Kathryn [name] [name] ([id number]) [id number] Page 9 of 9 Referring Provider: Treating Provider/Extender: Lonell Face in Treatment: 2 Diagnosis Coding ICD-10 Codes Code Description E11.621 Type 2 diabetes mellitus with foot ulcer I73.89 Other specified peripheral vascular diseases L97.522 Non-pressure chronic ulcer of other part of left foot with fat layer exposed L97.512 Non-pressure chronic ulcer of other part of right foot with fat layer exposed L97.812 Non-pressure chronic ulcer of other part of right lower leg with fat layer exposed M62.81 Muscle weakness (generalized) I50.42 Chronic combined systolic (congestive) and diastolic (congestive) heart failure I10 Essential (primary) hypertension Facility Procedures : CPT4 Code: [id number] Description: 22482 - WOUND CARE  VISIT-LEV 5 EST PT Modifier: Quantity: 1 Physician Procedures : CPT4 Code Description Modifier [id number] - WC PHYS LEVEL 3 - EST PT ICD-10 Diagnosis Description E11.621 Type 2 diabetes mellitus with foot ulcer I73.89 Other specified peripheral vascular diseases L97.522 Non-pressure chronic ulcer of other part  of left foot with fat layer exposed L97.512 Non-pressure chronic ulcer of other part of right foot with fat layer exposed Quantity: 1 Electronic Signature(s) Signed: 02/22/2022 5:03:56 PM By: Worthy Keeler PA-C Previous Signature: 02/22/2022 4:39:12 PM Version By: Rosalio Loud MSN RN CNS WTA Entered By: Worthy Keeler on 02/22/2022 17:03:56

## 2022-02-22 NOTE — Progress Notes (Addendum)
SYDNE, KRAHL ([id number]) [id number] Page 1 of 20 Visit Report for 02/22/2022 Arrival Information Details Patient Name: Date of Service: Kathryn [name] 02/22/2022 3:15 PM Medical Record Number: [id number] Patient Account Number: [id number] Date of Birth/Sex: Treating RN: October 18, 1933 (88 y.o. Kathryn [name] Primary Care Kathryn [name]: Kathryn [name] Other Clinician: Referring Kathryn [name]: Treating Kathryn [name]/Extender: Kathryn [name] in Treatment: 2 Visit Information History Since Last Visit Added or deleted any medications: No Patient Arrived: Wheel Chair Any new allergies or adverse reactions: No Arrival Time: 15:22 Had a fall or experienced change in No Accompanied By: sister activities of daily living that may affect Transfer Assistance: None risk of falls: Patient Requires Transmission-Based Precautions: No Signs or symptoms of abuse/neglect since last visito No Patient Has Alerts: Yes Hospitalized since last visit: No Patient Alerts: Patient on Blood Thinner Pain Present Now: No '81mg'$  daily Type II Diabees Electronic Signature(s) Signed: 02/22/2022 4:38:06 PM By: Rosalio Loud MSN RN CNS WTA Entered By: Rosalio Loud on 02/22/2022 16:38:06 -------------------------------------------------------------------------------- Clinic Level of Care Assessment Details Patient Name: Date of Service: Kathryn [name], [name] 02/22/2022 3:15 PM Medical Record Number: [id number] Patient Account Number: [id number] Date of Birth/Sex: Treating RN: 23-Jan-1934 (88 y.o. Kathryn [name] Primary Care Kathryn [name]: Kathryn [name] Other Clinician: Referring Kathryn [name]: Treating Kathryn [name]/Extender: Kathryn [name] in Treatment: 2 Clinic Level of Care Assessment Items TOOL 4 Quantity Score X- 1 0 Use when only an EandM is performed on FOLLOW-UP visit ASSESSMENTS - Nursing Assessment / Reassessment X- 1 10 Reassessment of Co-morbidities  (includes updates in patient status) X- 1 5 Reassessment of Adherence to Treatment Plan ASSESSMENTS - Wound and Skin A ssessment / Reassessment '[]'$  - 0 Simple Wound Assessment / Reassessment - one wound X- 8 5 Complex Wound Assessment / Reassessment - multiple wounds Kathryn [name], Kathryn [name] ([id number]) [id number] Page 2 of 20 '[]'$  - 0 Dermatologic / Skin Assessment (not related to wound area) ASSESSMENTS - Focused Assessment '[]'$  - 0 Circumferential Edema Measurements - multi extremities '[]'$  - 0 Nutritional Assessment / Counseling / Intervention '[]'$  - 0 Lower Extremity Assessment (monofilament, tuning fork, pulses) '[]'$  - 0 Peripheral Arterial Disease Assessment (using hand held doppler) ASSESSMENTS - Ostomy and/or Continence Assessment and Care '[]'$  - 0 Incontinence Assessment and Management '[]'$  - 0 Ostomy Care Assessment and Management (repouching, etc.) PROCESS - Coordination of Care '[]'$  - 0 Simple Patient / Family Education for ongoing care '[]'$  - 0 Complex (extensive) Patient / Family Education for ongoing care X- 1 10 Staff obtains Programmer, systems, Records, T Results / Process Orders est '[]'$  - 0 Staff telephones HHA, Nursing Homes / Clarify orders / etc '[]'$  - 0 Routine Transfer to another Facility (non-emergent condition) '[]'$  - 0 Routine Hospital Admission (non-emergent condition) '[]'$  - 0 New Admissions / Biomedical engineer / Ordering NPWT Apligraf, etc. , '[]'$  - 0 Emergency Hospital Admission (emergent condition) '[]'$  - 0 Simple Discharge Coordination X- 1 15 Complex (extensive) Discharge Coordination PROCESS - Special Needs '[]'$  - 0 Pediatric / Minor Patient Management '[]'$  - 0 Isolation Patient Management '[]'$  - 0 Hearing / Language / Visual special needs '[]'$  - 0 Assessment of Community assistance (transportation, D/C planning, etc.) '[]'$  - 0 Additional assistance / Altered mentation '[]'$  - 0 Support Surface(s) Assessment (bed, cushion, seat, etc.) INTERVENTIONS  - Wound Cleansing / Measurement '[]'$  - 0 Simple Wound Cleansing - one wound X- 8 5 Complex Wound Cleansing - multiple wounds X- 1 5 Wound Imaging (photographs - any  number of wounds) '[]'$  - 0 Wound Tracing (instead of photographs) '[]'$  - 0 Simple Wound Measurement - one wound X- 8 5 Complex Wound Measurement - multiple wounds INTERVENTIONS - Wound Dressings '[]'$  - 0 Small Wound Dressing one or multiple wounds X- 8 15 Medium Wound Dressing one or multiple wounds '[]'$  - 0 Large Wound Dressing one or multiple wounds '[]'$  - 0 Application of Medications - topical '[]'$  - 0 Application of Medications - injection INTERVENTIONS - Miscellaneous '[]'$  - 0 External ear exam '[]'$  - 0 Specimen Collection (cultures, biopsies, blood, body fluids, etc.) '[]'$  - 0 Specimen(s) / Culture(s) sent or taken to Lab for analysis Kathryn [name], Kathryn [name] ([id number]) [id number] Page 3 of 20 '[]'$  - 0 Patient Transfer (multiple staff / Civil Service fast streamer / Similar devices) '[]'$  - 0 Simple Staple / Suture removal (25 or less) '[]'$  - 0 Complex Staple / Suture removal (26 or more) '[]'$  - 0 Hypo / Hyperglycemic Management (close monitor of Blood Glucose) '[]'$  - 0 Ankle / Brachial Index (ABI) - do not check if billed separately X- 1 5 Vital Signs Has the patient been seen at the hospital within the last three years: Yes Total Score: 290 Level Of Care: New/Established - Level 5 Electronic Signature(s) Signed: 02/22/2022 4:40:58 PM By: Rosalio Loud MSN RN CNS WTA Entered By: Rosalio Loud on 02/22/2022 16:39:03 -------------------------------------------------------------------------------- Encounter Discharge Information Details Patient Name: Date of Service: Kathryn [name], [name] 02/22/2022 3:15 PM Medical Record Number: [id number] Patient Account Number: [id number] Date of Birth/Sex: Treating RN: 12-20-1933 (88 y.o. Kathryn [name] Primary Care Kathryn [name]: Kathryn [name] Other Clinician: Referring Tyton Abdallah: Treating  Kathryn [name]/Extender: Kathryn [name] in Treatment: 2 Encounter Discharge Information Items Discharge Condition: Stable Ambulatory Status: Wheelchair Discharge Destination: Home Transportation: Private Auto Accompanied By: sister Schedule Follow-up Appointment: Yes Clinical Summary of Care: Electronic Signature(s) Signed: 02/22/2022 4:40:27 PM By: Rosalio Loud MSN RN CNS WTA Entered By: Rosalio Loud on 02/22/2022 16:40:27 -------------------------------------------------------------------------------- Lower Extremity Assessment Details Patient Name: Date of Service: Kathryn [name], [name] 02/22/2022 3:15 PM Medical Record Number: [id number] Patient Account Number: [id number] Date of Birth/Sex: Treating RN: 07/10/1933 (88 y.o. Kathryn [name] Primary Care Wileen Duncanson: Kathryn [name] Other Clinician: Referring Jawan Chavarria: Treating Oralee Rapaport/Extender: Juanito Doom Lannon, Kathryn [name] ([id number]) [phone].pdf Page 4 of 20 Weeks in Treatment: 2 Edema Assessment Assessed: [Left: Yes] [Right: Yes] [Left: Edema] [Right: :] Calf Left: Right: Point of Measurement: 31 cm From Medial Instep 37.5 cm 36.3 cm Ankle Left: Right: Point of Measurement: 12 cm From Medial Instep 26.3 cm 25 cm Vascular Assessment Pulses: Dorsalis Pedis Palpable: [Left:Yes] [Right:Yes] Electronic Signature(s) Signed: 02/22/2022 4:38:24 PM By: Rosalio Loud MSN RN CNS WTA Entered By: Rosalio Loud on 02/22/2022 16:38:24 -------------------------------------------------------------------------------- Multi Wound Chart Details Patient Name: Date of Service: Kathryn [name], [name] 02/22/2022 3:15 PM Medical Record Number: [id number] Patient Account Number: [id number] Date of Birth/Sex: Treating RN: October 06, 1933 (88 y.o. Kathryn [name] Primary Care Emeli Goguen: Kathryn [name] Other Clinician: Referring Chakara Bognar: Treating Crockett Rallo/Extender: Kathryn [name]  in Treatment: 2 Vital Signs Height(in): 64 Pulse(bpm): 71 Weight(lbs): 178 Blood Pressure(mmHg): 147/63 Body Mass Index(BMI): 30.6 Temperature(F): 97.7 Respiratory Rate(breaths/min): 18 [1:Photos:] Left T Fourth oe Left, Lateral Foot Right, Lateral Lower Leg Wound Location: Trauma Gradually Appeared Gradually Appeared Wounding Event: Diabetic Wound/Ulcer of the Lower Diabetic Wound/Ulcer of the Lower Diabetic Wound/Ulcer of the Lower Primary Etiology: Extremity Extremity Extremity Chronic Obstructive Pulmonary Chronic Obstructive Pulmonary Chronic Obstructive Pulmonary Comorbid History: Disease (  COPD), Congestive Heart Disease (COPD), Congestive Heart Disease (COPD), Congestive Heart Failure, Coronary Artery Disease, Failure, Coronary Artery Disease, Failure, Coronary Artery Disease, Goar, Haruye [name] ([id number]) [id number] Page 5 of 20 Hypertension, Peripheral Arterial Hypertension, Peripheral Arterial Hypertension, Peripheral Arterial Disease, Peripheral Venous Disease, Disease, Peripheral Venous Disease, Disease, Peripheral Venous Disease, Type II Diabetes, Rheumatoid Arthritis, Type II Diabetes, Rheumatoid Arthritis, Type II Diabetes, Rheumatoid Arthritis, Neuropathy Neuropathy Neuropathy 01/22/2022 01/22/2022 01/22/2022 Date Acquired: '2 2 2 '$ Weeks of Treatment: Open Open Open Wound Status: No No No Wound Recurrence: 0.9x1x0.1 0.5x0.3x0.1 0.4x0.3x0.1 Measurements [name] x W x D (cm) 0.707 0.118 0.094 A (cm) : rea 0.071 0.012 0.009 Volume (cm) : -42.80% 70.00% 85.00% % Reduction in A rea: -44.90% 69.20% 85.70% % Reduction in Volume: Grade 1 Grade 1 Grade 1 Classification: Medium Medium Medium Exudate A mount: Serous Serous Serous Exudate Type: amber amber amber Exudate Color: None Present (0%) None Present (0%) None Present (0%) Granulation A mount: N/A N/A N/A Granulation Quality: Large (67-100%) Large (67-100%) Large  (67-100%) Necrotic A mount: Adherent Slough Adherent Slough Eschar Necrotic Tissue: Fat Layer (Subcutaneous Tissue): Yes Fat Layer (Subcutaneous Tissue): Yes Fat Layer (Subcutaneous Tissue): Yes Exposed Structures: Fascia: No Fascia: No Fascia: No Tendon: No Tendon: No Tendon: No Muscle: No Muscle: No Muscle: No Joint: No Joint: No Joint: No Bone: No Bone: No Bone: No N/A N/A N/A Epithelialization: Wound Number: '4 5 6 '$ Photos: Right, Proximal Lower Leg Right, Distal, Posterior Lower Leg Right, Proximal, Posterior Lower Leg Wound Location: Gradually Appeared Gradually Appeared Gradually Appeared Wounding Event: Diabetic Wound/Ulcer of the Lower Diabetic Wound/Ulcer of the Lower Diabetic Wound/Ulcer of the Lower Primary Etiology: Extremity Extremity Extremity Chronic Obstructive Pulmonary Chronic Obstructive Pulmonary Chronic Obstructive Pulmonary Comorbid History: Disease (COPD), Congestive Heart Disease (COPD), Congestive Heart Disease (COPD), Congestive Heart Failure, Coronary Artery Disease, Failure, Coronary Artery Disease, Failure, Coronary Artery Disease, Hypertension, Peripheral Arterial Hypertension, Peripheral Arterial Hypertension, Peripheral Arterial Disease, Peripheral Venous Disease, Disease, Peripheral Venous Disease, Disease, Peripheral Venous Disease, Type II Diabetes, Rheumatoid Arthritis, Type II Diabetes, Rheumatoid Arthritis, Type II Diabetes, Rheumatoid Arthritis, Neuropathy Neuropathy Neuropathy 01/15/2022 01/15/2022 12/18/2021 Date Acquired: '2 2 2 '$ Weeks of Treatment: Open Open Open Wound Status: No No No Wound Recurrence: 0.4x0.4x0.1 0.7x0.6x0.1 0.3x0.7x0.1 Measurements [name] x W x D (cm) 0.126 0.33 0.165 A (cm) : rea 0.013 0.033 0.016 Volume (cm) : 64.30% 29.90% -75.50% % Reduction in A rea: 62.90% 64.90% -77.80% % Reduction in Volume: Grade 1 Grade 1 Grade 1 Classification: Medium Medium Medium Exudate A mount: Serous Serous  Serous Exudate Type: amber amber amber Exudate Color: Small (1-33%) None Present (0%) None Present (0%) Granulation A mount: N/A N/A N/A Granulation Quality: Large (67-100%) Large (67-100%) Large (67-100%) Necrotic A mount: N/A Adherent Eloy Necrotic Tissue: Fat Layer (Subcutaneous Tissue): Yes Fat Layer (Subcutaneous Tissue): Yes Fat Layer (Subcutaneous Tissue): Yes Exposed Structures: Fascia: No Fascia: No Tendon: No Tendon: No Muscle: No Muscle: No Joint: No Joint: No Bone: No Bone: No N/A N/A N/A Epithelialization: Wound Number: 7 8 N/A Photos: N/A Kathryn [name], Kathryn [name] ([id number]) [id number] Page 6 of 20 Right T Second oe Left, Medial Lower Leg N/A Wound Location: Gradually Appeared Laceration N/A Wounding Event: Diabetic Wound/Ulcer of the Lower Abrasion N/A Primary Etiology: Extremity Chronic Obstructive Pulmonary Chronic Obstructive Pulmonary N/A Comorbid History: Disease (COPD), Congestive Heart Disease (COPD), Congestive Heart Failure, Coronary Artery Disease, Failure, Coronary Artery Disease, Hypertension, Peripheral Arterial Hypertension, Peripheral Arterial Disease, Peripheral Venous Disease, Disease, Peripheral Venous Disease,  Type II Diabetes, Rheumatoid Arthritis, Type II Diabetes, Rheumatoid Arthritis, Neuropathy Neuropathy 01/15/2022 02/04/2022 N/A Date Acquired: 2 0 N/A Weeks of Treatment: Open Open N/A Wound Status: No No N/A Wound Recurrence: 0.4x0.6x0.1 4.5x1x0.1 N/A Measurements [name] x W x D (cm) 0.188 3.534 N/A A (cm) : rea 0.019 0.353 N/A Volume (cm) : 4.10% N/A N/A % Reduction in A rea: 51.30% N/A N/A % Reduction in Volume: Grade 1 Partial Thickness N/A Classification: Medium Small N/A Exudate A mount: Serous Serous N/A Exudate Type: amber amber N/A Exudate Color: None Present (0%) Small (1-33%) N/A Granulation A mount: N/A Red N/A Granulation Quality: Large (67-100%) None  Present (0%) N/A Necrotic A mount: Adherent Slough N/A N/A Necrotic Tissue: Fat Layer (Subcutaneous Tissue): Yes Fat Layer (Subcutaneous Tissue): Yes N/A Exposed Structures: Fascia: No Fascia: No Tendon: No Tendon: No Muscle: No Muscle: No Joint: No Joint: No Bone: No Bone: No N/A Small (1-33%) N/A Epithelialization: Treatment Notes Electronic Signature(s) Signed: 02/22/2022 4:38:40 PM By: Rosalio Loud MSN RN CNS WTA Entered By: Rosalio Loud on 02/22/2022 16:38:40 -------------------------------------------------------------------------------- Multi-Disciplinary Care Plan Details Patient Name: Date of Service: Kathryn [name], [name] 02/22/2022 3:15 PM Medical Record Number: [id number] Patient Account Number: [id number] Date of Birth/Sex: Treating RN: 02-Sep-1933 (88 y.o. Kathryn [name] Primary Care Azelie Noguera: Kathryn [name] Other Clinician: Referring Elena Cothern: Treating Kiet Geer/Extender: Kathryn [name] in Treatment: 2 Active Inactive Abuse / Safety / Falls / Self Care Management Nursing Diagnoses: History of Falls Impaired physical mobility Potential for falls Potential for injury related to falls GoalsODENA, Kathryn [name] ([id number]) [id number] Page 7 of 20 Patient/caregiver will verbalize understanding of skin care regimen Date Initiated: 02/06/2022 Target Resolution Date: 02/06/2022 Goal Status: Active Patient/caregiver will verbalize/demonstrate measures taken to improve the patient's personal safety Date Initiated: 02/06/2022 Target Resolution Date: 02/06/2022 Goal Status: Active Interventions: Assess fall risk on admission and as needed Notes: Necrotic Tissue Nursing Diagnoses: Impaired tissue integrity related to necrotic/devitalized tissue Knowledge deficit related to management of necrotic/devitalized tissue Goals: Necrotic/devitalized tissue will be minimized in the wound bed Date Initiated:  02/06/2022 Target Resolution Date: 02/06/2022 Goal Status: Active Patient/caregiver will verbalize understanding of reason and process for debridement of necrotic tissue Date Initiated: 02/06/2022 Target Resolution Date: 02/06/2022 Goal Status: Active Interventions: Assess patient pain level pre-, during and post procedure and prior to discharge Provide education on necrotic tissue and debridement process Treatment Activities: Apply topical anesthetic as ordered : 02/06/2022 Excisional debridement : 02/06/2022 Notes: Orientation to the Wound Care Program Nursing Diagnoses: Knowledge deficit related to the wound healing center program Goals: Patient/caregiver will verbalize understanding of the [place] Program Date Initiated: 02/06/2022 Target Resolution Date: 02/06/2022 Goal Status: Active Interventions: Provide education on orientation to the wound center Notes: Venous Leg Ulcer Nursing Diagnoses: Actual venous Insuffiency (use after diagnosis is confirmed) Knowledge deficit related to disease process and management Goals: Non-invasive venous studies are completed as ordered Date Initiated: 02/06/2022 Target Resolution Date: 01/25/2022 Goal Status: Active Patient will maintain optimal edema control Date Initiated: 02/06/2022 Target Resolution Date: 02/06/2022 Goal Status: Active Patient/caregiver will verbalize understanding of disease process and disease management Date Initiated: 02/06/2022 Target Resolution Date: 02/06/2022 Goal Status: Active Interventions: Assess peripheral edema status every visit. Notes: Wound/Skin Impairment Nursing Diagnoses: Impaired tissue integrity Knowledge deficit related to smoking impact on wound healing Baglio, Parrie [name] ([id number]) [id number] Page 8 of 20 Goals: Patient/caregiver will verbalize understanding of skin care regimen Date Initiated: 02/06/2022 Target Resolution Date:  02/06/2022 Goal Status:  Active Ulcer/skin breakdown will have a volume reduction of 30% by week 4 Date Initiated: 02/06/2022 Target Resolution Date: 02/27/2022 Goal Status: Active Interventions: Assess ulceration(s) every visit Treatment Activities: Referred to DME [employer] for dressing supplies : 02/06/2022 Skin care regimen initiated : 02/06/2022 Topical wound management initiated : 02/06/2022 Notes: Electronic Signature(s) Signed: 02/22/2022 4:38:29 PM By: Rosalio Loud MSN RN CNS WTA Entered By: Rosalio Loud on 02/22/2022 16:38:29 -------------------------------------------------------------------------------- Pain Assessment Details Patient Name: Date of Service: Kathryn [name], [name] 02/22/2022 3:15 PM Medical Record Number: [id number] Patient Account Number: [id number] Date of Birth/Sex: Treating RN: 04-Jun-1933 (88 y.o. Kathryn [name] Primary Care Quayshaun Hubbert: Kathryn [name] Other Clinician: Referring Camy Leder: Treating Cayci Mcnabb/Extender: Kathryn [name] in Treatment: 2 Active Problems Location of Pain Severity and Description of Pain Patient Has Paino No Site Locations Pain Management and Medication Current Pain Management: Electronic Signature(s) Signed: 02/22/2022 4:38:15 PM By: Rosalio Loud MSN RN CNS SARITA, HAKANSON [name] ([id number]) PM By: Rosalio Loud MSN RN CNS Lissa Morales [phone].pdf Page 9 of 20 Signed: 02/22/2022 4:38:15 Entered By: Rosalio Loud on 02/22/2022 16:38:15 -------------------------------------------------------------------------------- Patient/Caregiver Education Details Patient Name: Date of Service: Kathryn [name], DEA NNA Carlean Jews 11/30/2023andnbsp3:15 PM Medical Record Number: [id number] Patient Account Number: [id number] Date of Birth/Gender: Treating RN: 1934-01-25 (88 y.o. Kathryn [name] Primary Care Physician: Kathryn [name] Other Clinician: Referring Physician: Treating Physician/Extender: Kathryn [name] in Treatment: 2 Education Assessment Education Provided To: Patient Education Topics Provided Wound/Skin Impairment: Handouts: Caring for Your Ulcer Methods: Explain/Verbal Responses: State content correctly Electronic Signature(s) Signed: 02/22/2022 4:40:58 PM By: Rosalio Loud MSN RN CNS WTA Entered By: Rosalio Loud on 02/22/2022 16:39:24 -------------------------------------------------------------------------------- Wound Assessment Details Patient Name: Date of Service: Kathryn [name], [name] 02/22/2022 3:15 PM Medical Record Number: [id number] Patient Account Number: [id number] Date of Birth/Sex: Treating RN: Apr 24, 1933 (88 y.o. Kathryn [name] Primary Care Effie Wahlert: Kathryn [name] Other Clinician: Referring Clifton Safley: Treating Shalice Woodring/Extender: Kathryn [name] in Treatment: 2 Wound Status Wound Number: 1 Primary Diabetic Wound/Ulcer of the Lower Extremity Etiology: Wound Location: Left T Fourth oe Wound Open Wounding Event: Trauma Status: Date Acquired: 01/22/2022 Comorbid Chronic Obstructive Pulmonary Disease (COPD), Congestive Heart Weeks Of Treatment: 2 History: Failure, Coronary Artery Disease, Hypertension, Peripheral Arterial Clustered Wound: No Disease, Peripheral Venous Disease, Type II Diabetes, Rheumatoid Arthritis, Neuropathy Runion, Aariah [name] ([id number]) [id number] Page 10 of 20 Photos Wound Measurements Length: (cm) 0.9 Width: (cm) 1 Depth: (cm) 0.1 Area: (cm) 0.707 Volume: (cm) 0.071 % Reduction in Area: -42.8% % Reduction in Volume: -44.9% Wound Description Classification: Grade 1 Exudate Amount: Medium Exudate Type: Serous Exudate Color: amber Foul Odor After Cleansing: No Slough/Fibrino Yes Wound Bed Granulation Amount: None Present (0%) Exposed Structure Necrotic Amount: Large (67-100%) Fascia Exposed: No Necrotic Quality: Adherent Slough Fat Layer (Subcutaneous Tissue) Exposed:  Yes Tendon Exposed: No Muscle Exposed: No Joint Exposed: No Bone Exposed: No Treatment Notes Wound #1 (Toe Fourth) Wound Laterality: Left Cleanser Soap and Water Discharge Instruction: Gently cleanse wound with antibacterial soap, rinse and pat dry prior to dressing wounds Peri-Wound Care Topical Primary Dressing Xeroform-HBD 2x2 (in/in) Discharge Instruction: Apply Xeroform-HBD 2x2 (in/in) as directed Secondary Dressing Coverlet Latex-Free Fabric Adhesive Dressings Discharge Instruction: 1.5 x 2 Secured With Tubigrip Size D, 3x10 (in/yd) Discharge Instruction: single Compression Wrap Compression Stockings Add-Ons Electronic Signature(s) Signed: 02/22/2022 4:40:58 PM By: Rosalio Loud MSN RN CNS WTA Entered By: Rosalio Loud on 02/22/2022 15:47:40 Shedden, Kathryn [name] ([id number]) [id number] Page 11 of  20 -------------------------------------------------------------------------------- Wound Assessment Details Patient Name: Date of Service: Kathryn [name] 02/22/2022 3:15 PM Medical Record Number: [id number] Patient Account Number: [id number] Date of Birth/Sex: Treating RN: 02-03-1934 (88 y.o. Kathryn [name] Primary Care Jaqualyn Juday: Kathryn [name] Other Clinician: Referring Zaela Graley: Treating Trever Streater/Extender: Kathryn [name] in Treatment: 2 Wound Status Wound Number: 2 Primary Diabetic Wound/Ulcer of the Lower Extremity Etiology: Wound Location: Left, Lateral Foot Wound Open Wounding Event: Gradually Appeared Status: Date Acquired: 01/22/2022 Comorbid Chronic Obstructive Pulmonary Disease (COPD), Congestive Heart Weeks Of Treatment: 2 History: Failure, Coronary Artery Disease, Hypertension, Peripheral Arterial Clustered Wound: No Disease, Peripheral Venous Disease, Type II Diabetes, Rheumatoid Arthritis, Neuropathy Photos Wound Measurements Length: (cm) 0.5 Width: (cm) 0.3 Depth: (cm) 0.1 Area: (cm) 0.118 Volume:  (cm) 0.012 % Reduction in Area: 70% % Reduction in Volume: 69.2% Wound Description Classification: Grade 1 Exudate Amount: Medium Exudate Type: Serous Exudate Color: amber Foul Odor After Cleansing: No Slough/Fibrino Yes Wound Bed Granulation Amount: None Present (0%) Exposed Structure Necrotic Amount: Large (67-100%) Fascia Exposed: No Necrotic Quality: Adherent Slough Fat Layer (Subcutaneous Tissue) Exposed: Yes Tendon Exposed: No Muscle Exposed: No Joint Exposed: No Bone Exposed: No Treatment Notes Wound #2 (Foot) Wound Laterality: Left, Lateral Cleanser Soap and Water Discharge Instruction: Gently cleanse wound with antibacterial soap, rinse and pat dry prior to dressing wounds Fielder, Mylea [name] ([id number]) [id number] Page 12 of 20 Peri-Wound Care Topical Primary Dressing Xeroform-HBD 2x2 (in/in) Discharge Instruction: Apply Xeroform-HBD 2x2 (in/in) as directed Secondary Dressing Coverlet Latex-Free Fabric Adhesive Dressings Discharge Instruction: 1.5 x 2 Secured With Tubigrip Size D, 3x10 (in/yd) Discharge Instruction: single Compression Wrap Compression Stockings Add-Ons Electronic Signature(s) Signed: 02/22/2022 4:40:58 PM By: Rosalio Loud MSN RN CNS WTA Entered By: Rosalio Loud on 02/22/2022 15:48:25 -------------------------------------------------------------------------------- Wound Assessment Details Patient Name: Date of Service: Kathryn [name], [name] 02/22/2022 3:15 PM Medical Record Number: [id number] Patient Account Number: [id number] Date of Birth/Sex: Treating RN: [birth year] (88 y.o. Kathryn [name] Primary Care Ivry Pigue: Kathryn [name] Other Clinician: Referring Ayce Pietrzyk: Treating Edker Punt/Extender: Kathryn [name] in Treatment: 2 Wound Status Wound Number: 3 Primary Diabetic Wound/Ulcer of the Lower Extremity Etiology: Wound Location: Right, Lateral Lower Leg Wound Open Wounding Event: Gradually  Appeared Status: Date Acquired: 01/22/2022 Comorbid Chronic Obstructive Pulmonary Disease (COPD), Congestive Heart Weeks Of Treatment: 2 History: Failure, Coronary Artery Disease, Hypertension, Peripheral Arterial Clustered Wound: No Disease, Peripheral Venous Disease, Type II Diabetes, Rheumatoid Arthritis, Neuropathy Photos Wound Measurements Length: (cm) 0.4 Width: (cm) 0.3 Depth: (cm) 0.1 Swing, Vikkie [name] ([id number]) Area: (cm) 0.094 Volume: (cm) 0.009 % Reduction in Area: 85% % Reduction in Volume: 85.7% [id number] Page 13 of 20 Wound Description Classification: Grade 1 Exudate Amount: Medium Exudate Type: Serous Exudate Color: amber Foul Odor After Cleansing: No Slough/Fibrino No Wound Bed Granulation Amount: None Present (0%) Exposed Structure Necrotic Amount: Large (67-100%) Fascia Exposed: No Necrotic Quality: Eschar Fat Layer (Subcutaneous Tissue) Exposed: Yes Tendon Exposed: No Muscle Exposed: No Joint Exposed: No Bone Exposed: No Treatment Notes Wound #3 (Lower Leg) Wound Laterality: Right, Lateral Cleanser Soap and Water Discharge Instruction: Gently cleanse wound with antibacterial soap, rinse and pat dry prior to dressing wounds Peri-Wound Care Topical Primary Dressing Xeroform-HBD 2x2 (in/in) Discharge Instruction: Apply Xeroform-HBD 2x2 (in/in) as directed Secondary Dressing Coverlet Latex-Free Fabric Adhesive Dressings Discharge Instruction: 1.5 x 2 Secured With Tubigrip Size D, 3x10 (in/yd) Discharge Instruction: single Compression Wrap Compression Stockings Add-Ons Electronic Signature(s) Signed: 02/22/2022 4:40:58 PM By: Tamala Julian,  Jocelyn Lamer MSN RN CNS WTA Entered By: Rosalio Loud on 02/22/2022 15:49:00 -------------------------------------------------------------------------------- Wound Assessment Details Patient Name: Date of Service: Kathryn [name] 02/22/2022 3:15 PM Medical Record Number:  [id number] Patient Account Number: [id number] Date of Birth/Sex: Treating RN: 07/11/1933 (88 y.o. Kathryn [name] Primary Care Shaquira Moroz: Kathryn [name] Other Clinician: Referring Linwood Gullikson: Treating Wesam Gearhart/Extender: Kathryn [name] in Treatment: 2 Wound Status Kathryn [name], Kathryn [name] ([id number]) [id number] Page 14 of 20 Wound Number: 4 Primary Diabetic Wound/Ulcer of the Lower Extremity Etiology: Wound Location: Right, Proximal Lower Leg Wound Open Wounding Event: Gradually Appeared Status: Date Acquired: 01/15/2022 Comorbid Chronic Obstructive Pulmonary Disease (COPD), Congestive Heart Weeks Of Treatment: 2 History: Failure, Coronary Artery Disease, Hypertension, Peripheral Arterial Clustered Wound: No Disease, Peripheral Venous Disease, Type II Diabetes, Rheumatoid Arthritis, Neuropathy Photos Wound Measurements Length: (cm) 0.4 Width: (cm) 0.4 Depth: (cm) 0.1 Area: (cm) 0.126 Volume: (cm) 0.013 % Reduction in Area: 64.3% % Reduction in Volume: 62.9% Wound Description Classification: Grade 1 Exudate Amount: Medium Exudate Type: Serous Exudate Color: amber Foul Odor After Cleansing: No Slough/Fibrino No Wound Bed Granulation Amount: Small (1-33%) Exposed Structure Necrotic Amount: Large (67-100%) Fascia Exposed: No Fat Layer (Subcutaneous Tissue) Exposed: Yes Tendon Exposed: No Muscle Exposed: No Joint Exposed: No Bone Exposed: No Treatment Notes Wound #4 (Lower Leg) Wound Laterality: Right, Proximal Cleanser Soap and Water Discharge Instruction: Gently cleanse wound with antibacterial soap, rinse and pat dry prior to dressing wounds Peri-Wound Care Topical Primary Dressing Xeroform-HBD 2x2 (in/in) Discharge Instruction: Apply Xeroform-HBD 2x2 (in/in) as directed Secondary Dressing Coverlet Latex-Free Fabric Adhesive Dressings Discharge Instruction: 1.5 x 2 Secured With Tubigrip Size D, 3x10 (in/yd) Discharge  Instruction: single Compression Wrap Compression Stockings Add-Ons Kathryn [name], Kathryn [name] ([id number]) [id number].pdf Page 15 of 20 Electronic Signature(s) Signed: 02/22/2022 4:40:58 PM By: Rosalio Loud MSN RN CNS WTA Entered By: Rosalio Loud on 02/22/2022 15:49:50 -------------------------------------------------------------------------------- Wound Assessment Details Patient Name: Date of Service: Kathryn [name], [name] 02/22/2022 3:15 PM Medical Record Number: [id number] Patient Account Number: [id number] Date of Birth/Sex: Treating RN: 06/19/1933 (88 y.o. Kathryn [name] Primary Care Jayona Mccaig: Kathryn [name] Other Clinician: Referring Bradey Luzier: Treating Anahit Klumb/Extender: Kathryn [name] in Treatment: 2 Wound Status Wound Number: 5 Primary Diabetic Wound/Ulcer of the Lower Extremity Etiology: Wound Location: Right, Distal, Posterior Lower Leg Wound Open Wounding Event: Gradually Appeared Status: Date Acquired: 01/15/2022 Comorbid Chronic Obstructive Pulmonary Disease (COPD), Congestive Heart Weeks Of Treatment: 2 History: Failure, Coronary Artery Disease, Hypertension, Peripheral Arterial Clustered Wound: No Disease, Peripheral Venous Disease, Type II Diabetes, Rheumatoid Arthritis, Neuropathy Photos Wound Measurements Length: (cm) 0.7 Width: (cm) 0.6 Depth: (cm) 0.1 Area: (cm) 0.33 Volume: (cm) 0.033 % Reduction in Area: 29.9% % Reduction in Volume: 64.9% Wound Description Classification: Exudate Amount: Exudate Type: Exudate Color: Grade 1 Medium Serous amber Wound Bed Granulation Amount: None Present (0%) Exposed Structure Necrotic Amount: Large (67-100%) Fat Layer (Subcutaneous Tissue) Exposed: Yes Necrotic Quality: Adherent Slough Treatment Notes Wound #5 (Lower Leg) Wound Laterality: Right, Posterior, Distal Cleanser Kimpel, Kathryn [name] ([id number]) [id number].pdf Page 16 of 20 Soap and  Water Discharge Instruction: Gently cleanse wound with antibacterial soap, rinse and pat dry prior to dressing wounds Peri-Wound Care Topical Primary Dressing Xeroform-HBD 2x2 (in/in) Discharge Instruction: Apply Xeroform-HBD 2x2 (in/in) as directed Secondary Dressing Coverlet Latex-Free Fabric Adhesive Dressings Discharge Instruction: 1.5 x 2 Secured With Tubigrip Size D, 3x10 (in/yd) Discharge Instruction: single Compression Wrap Compression Stockings Add-Ons Electronic Signature(s) Signed: 02/22/2022 4:40:58 PM By: Rosalio Loud  MSN RN CNS WTA Entered By: Rosalio Loud on 02/22/2022 15:51:11 -------------------------------------------------------------------------------- Wound Assessment Details Patient Name: Date of Service: Kathryn [name] 02/22/2022 3:15 PM Medical Record Number: [id number] Patient Account Number: [id number] Date of Birth/Sex: Treating RN: 05/29/1933 (88 y.o. Kathryn [name] Primary Care Edgel Degnan: Kathryn [name] Other Clinician: Referring Harim Bi: Treating Damaris Abeln/Extender: Kathryn [name] in Treatment: 2 Wound Status Wound Number: 6 Primary Diabetic Wound/Ulcer of the Lower Extremity Etiology: Wound Location: Right, Proximal, Posterior Lower Leg Wound Open Wounding Event: Gradually Appeared Status: Date Acquired: 12/18/2021 Comorbid Chronic Obstructive Pulmonary Disease (COPD), Congestive Heart Weeks Of Treatment: 2 History: Failure, Coronary Artery Disease, Hypertension, Peripheral Arterial Clustered Wound: No Disease, Peripheral Venous Disease, Type II Diabetes, Rheumatoid Arthritis, Neuropathy Photos Wound Measurements Kathryn [name], Kathryn [name] ([id number]) Length: (cm) 0.3 Width: (cm) 0.7 Depth: (cm) 0.1 Area: (cm) 0.165 Volume: (cm) 0.016 [id number].pdf Page 17 of 20 % Reduction in Area: -75.5% % Reduction in Volume: -77.8% Wound Description Classification: Grade 1 Exudate Amount: Medium Exudate  Type: Serous Exudate Color: amber Slough/Fibrino Yes Wound Bed Granulation Amount: None Present (0%) Exposed Structure Necrotic Amount: Large (67-100%) Fascia Exposed: No Necrotic Quality: Adherent Slough Fat Layer (Subcutaneous Tissue) Exposed: Yes Tendon Exposed: No Muscle Exposed: No Joint Exposed: No Bone Exposed: No Treatment Notes Wound #6 (Lower Leg) Wound Laterality: Right, Posterior, Proximal Cleanser Soap and Water Discharge Instruction: Gently cleanse wound with antibacterial soap, rinse and pat dry prior to dressing wounds Peri-Wound Care Topical Primary Dressing Xeroform-HBD 2x2 (in/in) Discharge Instruction: Apply Xeroform-HBD 2x2 (in/in) as directed Secondary Dressing Coverlet Latex-Free Fabric Adhesive Dressings Discharge Instruction: 1.5 x 2 Secured With Tubigrip Size D, 3x10 (in/yd) Discharge Instruction: single Compression Wrap Compression Stockings Add-Ons Electronic Signature(s) Signed: 02/22/2022 4:40:58 PM By: Rosalio Loud MSN RN CNS WTA Entered By: Rosalio Loud on 02/22/2022 15:52:17 -------------------------------------------------------------------------------- Wound Assessment Details Patient Name: Date of Service: Kathryn [name], [name] 02/22/2022 3:15 PM Medical Record Number: [id number] Patient Account Number: [id number] Date of Birth/Sex: Treating RN: 17-Sep-1933 (88 y.o. Kathryn [name] Primary Care Heran Campau: Kathryn [name] Other Clinician: Referring Loa Idler: Treating Jacquelyn Shadrick/Extender: Juanito Doom [place], Kathryn [name] ([id number]) [phone].pdf Page 18 of 20 Weeks in Treatment: 2 Wound Status Wound Number: 7 Primary Diabetic Wound/Ulcer of the Lower Extremity Etiology: Wound Location: Right T Second oe Wound Open Wounding Event: Gradually Appeared Status: Date Acquired: 01/15/2022 Comorbid Chronic Obstructive Pulmonary Disease (COPD), Congestive Heart Weeks Of Treatment: 2 History: Failure,  Coronary Artery Disease, Hypertension, Peripheral Arterial Clustered Wound: No Disease, Peripheral Venous Disease, Type II Diabetes, Rheumatoid Arthritis, Neuropathy Photos Wound Measurements Length: (cm) 0.4 Width: (cm) 0.6 Depth: (cm) 0.1 Area: (cm) 0.188 Volume: (cm) 0.019 % Reduction in Area: 4.1% % Reduction in Volume: 51.3% Wound Description Classification: Exudate Amount: Exudate Type: Exudate Color: Grade 1 Medium Serous amber Wound Bed Granulation Amount: None Present (0%) Exposed Structure Necrotic Amount: Large (67-100%) Fascia Exposed: No Necrotic Quality: Adherent Slough Fat Layer (Subcutaneous Tissue) Exposed: Yes Tendon Exposed: No Muscle Exposed: No Joint Exposed: No Bone Exposed: No Treatment Notes Wound #7 (Toe Second) Wound Laterality: Right Cleanser Soap and Water Discharge Instruction: Gently cleanse wound with antibacterial soap, rinse and pat dry prior to dressing wounds Peri-Wound Care Topical Primary Dressing Xeroform-HBD 2x2 (in/in) Discharge Instruction: Apply Xeroform-HBD 2x2 (in/in) as directed Secondary Dressing Coverlet Latex-Free Fabric Adhesive Dressings Discharge Instruction: 1.5 x 2 Secured With Tubigrip Size D, 3x10 (in/yd) Discharge Instruction: single Compression Wrap Compression Stockings Kathryn [name], Kathryn [name] ([id number]) [id number].pdf Page [phone]  of 20 Add-Ons Electronic Signature(s) Signed: 02/22/2022 4:40:58 PM By: Rosalio Loud MSN RN CNS WTA Entered By: Rosalio Loud on 02/22/2022 15:51:40 -------------------------------------------------------------------------------- Wound Assessment Details Patient Name: Date of Service: Kathryn [name], [name] 02/22/2022 3:15 PM Medical Record Number: [id number] Patient Account Number: [id number] Date of Birth/Sex: Treating RN: 05-Nov-1933 (88 y.o. Kathryn [name] Primary Care Mamye Bolds: Kathryn [name] Other Clinician: Referring Linzi Ohlinger: Treating Mickayla Trouten/Extender:  Kathryn [name] in Treatment: 2 Wound Status Wound Number: 8 Primary Abrasion Etiology: Wound Location: Left, Medial Lower Leg Wound Open Wounding Event: Laceration Status: Date Acquired: 02/04/2022 Comorbid Chronic Obstructive Pulmonary Disease (COPD), Congestive Heart Weeks Of Treatment: 0 History: Failure, Coronary Artery Disease, Hypertension, Peripheral Arterial Clustered Wound: No Disease, Peripheral Venous Disease, Type II Diabetes, Rheumatoid Arthritis, Neuropathy Photos Wound Measurements Length: (cm) 4.5 Width: (cm) 1 Depth: (cm) 0.1 Area: (cm) 3.534 Volume: (cm) 0.353 % Reduction in Area: % Reduction in Volume: Epithelialization: Small (1-33%) Tunneling: No Undermining: No Wound Description Classification: Partial Thickness Exudate Amount: Small Exudate Type: Serous Exudate Color: amber Foul Odor After Cleansing: No Slough/Fibrino No Wound Bed Granulation Amount: Small (1-33%) Exposed Structure Granulation Quality: Red Fascia Exposed: No Necrotic Amount: None Present (0%) Fat Layer (Subcutaneous Tissue) Exposed: Yes Tendon Exposed: No Muscle Exposed: No Joint Exposed: No Bone Exposed: No Kathryn [name], Kathryn [name] ([id number]) [id number].pdf Page 20 of 20 Treatment Notes Wound #8 (Lower Leg) Wound Laterality: Left, Medial Cleanser Soap and Water Discharge Instruction: Gently cleanse wound with antibacterial soap, rinse and pat dry prior to dressing wounds Peri-Wound Care Topical Primary Dressing Xeroform-HBD 2x2 (in/in) Discharge Instruction: Apply Xeroform-HBD 2x2 (in/in) as directed Secondary Dressing Coverlet Latex-Free Fabric Adhesive Dressings Discharge Instruction: 1.5 x 2 Secured With Tubigrip Size D, 3x10 (in/yd) Discharge Instruction: single Compression Wrap Compression Stockings Add-Ons Electronic Signature(s) Signed: 02/22/2022 4:40:58 PM By: Rosalio Loud MSN RN CNS WTA Entered By: Rosalio Loud on  02/22/2022 15:46:56 -------------------------------------------------------------------------------- Shiner Details Patient Name: Date of Service: Kathryn [name], [name] 02/22/2022 3:15 PM Medical Record Number: [id number] Patient Account Number: [id number] Date of Birth/Sex: Treating RN: [birth year] (88 y.o. Kathryn [name] Primary Care Acadia Thammavong: Kathryn [name] Other Clinician: Referring Cowan Pilar: Treating Leily Capek/Extender: Kathryn [name] in Treatment: 2 Vital Signs Time Taken: 15:28 Temperature (F): 97.7 Height (in): 64 Pulse (bpm): 68 Weight (lbs): 178 Respiratory Rate (breaths/min): 18 Body Mass Index (BMI): 30.6 Blood Pressure (mmHg): 147/63 Reference Range: 80 - 120 mg / dl Electronic Signature(s) Signed: 02/22/2022 4:38:10 PM By: Rosalio Loud MSN RN CNS WTA Entered By: Rosalio Loud on 02/22/2022 16:38:10

## 2022-02-24 NOTE — Progress Notes (Signed)
MRN : [id number]  Kathryn [name] is a 88 y.o. (Apr 05, 1933) female who presents with chief complaint of check circulation.  History of Present Illness:   Patient is seen for evaluation of leg pain and swelling associated with new onset ulceration. The patient first noticed the swelling remotely. The swelling is associated with pain and discoloration. The pain and swelling worsens with prolonged dependency and improves with elevation. The pain is unrelated to activity.  The patient notes that in the morning the legs are better but the leg symptoms worsened throughout the course of the day. The patient has also noted a progressive worsening of the discoloration in the ankle and shin area.   The patient notes that an ulcer has developed acutely without specific trauma and since it occurred it has been very slow to heal.  There is a moderate amount of drainage associated with the open area.  The wound is also very painful.  The patient does describe claudication symptoms but no classic rest pain symptoms.   The patient has not had any past angiography, interventions or vascular surgery.  Elevation makes the leg symptoms better, dependency makes them much worse. The patient denies any recent changes in medications.  The patient has not been wearing graduated compression.  The patient denies a history of DVT or PE. There is no prior history of phlebitis. There is no history of primary lymphedema.  No history of malignancies. No history of trauma or groin or pelvic surgery. There is no history of radiation treatment to the groin or pelvis    ABI's dated 01/24/2022 Rt=0.66 and Lt=0.82 (biphasic signals bilaterally)  12/12/2021  BUN/Cr 20/1.02  GFR 54  No outpatient medications have been marked as taking for the 02/26/22 encounter (Appointment) with Delana Meyer, Dolores Lory, MD.    Past Medical History:  Diagnosis Date   Actinic keratosis    Aortic atherosclerosis ([place])  05/25/2020   Arthritis    spinal stenosis   Cardiomyopathy - presumed to be nonischemic    a. 03/2020 Echo: EF 40-45%, gr2 DD. Nl RV fxn. Mildly dil LA. Mild MR/ao sclerosis; b. 03/2020 MV: EF 30-44%, no ischemia. Cor Ca2+ in pLAD.   CHF (congestive heart failure) (HCC)    COPD (chronic obstructive pulmonary disease) (HCC)    Coronary artery calcification seen on CT scan    a. 03/2020 MV: CT attenuation images show Ao and mod pLAD Ca2+.   Diabetes mellitus without complication ([place])    Gastric ulcer 06/2010   treated with Prolisec- states no problems now   Hyperlipidemia    Hypertension    PCP Dr Miguel Aschoff   [place]   Hypothyroidism    Left bundle branch block (LBBB)    Neuromuscular disorder (HCC)    slight numbness right toes- comes and goes   Peripheral vascular disease (HCC)    varicose veins left leg   Pneumonia    PSVT (paroxysmal supraventricular tachycardia)    a. 03/2020 Zio: Avg HR 87 (66-211).  4 beats NSVT.  43 PSVT episodes, longest 10h 46m@ avg of 153 bpm (max 211).  Seen by EP-->amio started (pt wished to avoid EPS/RFCA).   Seasonal allergies    Shortness of breath    Skin cancer    multiple from face   Squamous cell carcinoma of skin 03/23/2013   L dorsal hand   Squamous cell carcinoma of skin 02/04/2014  R forearm/in situ, L pretibial   Squamous cell carcinoma of skin 09/24/2018   R thumb   Squamous cell carcinoma of skin 09/08/2019   L forearm - ED&C   Squamous cell carcinoma of skin 09/08/2019   R dosal hand - ED&C   Squamous cell carcinoma of skin 06/20/2021   R cheek preauricular, excised    Past Surgical History:  Procedure Laterality Date   BACK SURGERY     4/12  Dr Shellia Carwin- for lumbar stenosis   BREAST CYST ASPIRATION Left 1965   negative   [place]   lumpectomy with biopsy   left   CARPAL TUNNEL RELEASE     right   COLONOSCOPY     COLONOSCOPY WITH PROPOFOL N/A 06/30/2015   Procedure: COLONOSCOPY WITH PROPOFOL;  Surgeon:  Hulen Luster, MD;  Location: [facility] ENDOSCOPY;  Service: Gastroenterology;  Laterality: N/A;   COLONOSCOPY WITH PROPOFOL N/A 01/22/2017   Procedure: COLONOSCOPY WITH PROPOFOL;  Surgeon: Lollie Sails, MD;  Location: [facility]OSCOPY;  Service: Endoscopy;  Laterality: N/A;   COLONOSCOPY WITH PROPOFOL N/A 01/28/2019   Procedure: COLONOSCOPY WITH PROPOFOL;  Surgeon: Toledo, Benay Pike, MD;  Location: [facility] ENDOSCOPY;  Service: Gastroenterology;  Laterality: N/A;   ESOPHAGOGASTRODUODENOSCOPY     ESOPHAGOGASTRODUODENOSCOPY (EGD) WITH PROPOFOL N/A 01/28/2019   Procedure: ESOPHAGOGASTRODUODENOSCOPY (EGD) WITH PROPOFOL;  Surgeon: Toledo, Benay Pike, MD;  Location: [facility] ENDOSCOPY;  Service: Gastroenterology;  Laterality: N/A;   EYE SURGERY     cataract extraction with IOL bilaterally   JOINT REPLACEMENT     LUMBAR LAMINECTOMY/DECOMPRESSION MICRODISCECTOMY  05/24/2011   Procedure: LUMBAR LAMINECTOMY/DECOMPRESSION MICRODISCECTOMY 2 LEVELS;  Surgeon: Magnus Sinning, MD;  Location: [facility] ORS;  Service: Orthopedics;  Laterality: N/A;  L2-L3, L3-L4 (x-ray)   RIGHT OOPHORECTOMY     due to STAPH INFECTION    Social History Social History   Tobacco Use   Smoking status: Former    Packs/day: 0.50    Years: 50.00    Total pack years: 25.00    Types: Cigarettes    Quit date: 05/16/2004    Years since quitting: 17.7   Smokeless tobacco: Never  Vaping Use   Vaping Use: Never used  Substance Use Topics   Alcohol use: Yes    Comment: socially-1 glass of wine or [place] icea tea once a month   Drug use: No    Family History Family History  Problem Relation Age of Onset   Dementia Mother    Lung cancer Father    Heart disease Father    Hypertension Sister    Breast cancer Maternal Aunt    Arthritis Son     Allergies  Allergen Reactions   Oxycodone Other (See Comments)    Mental status change   Prednisone     swelling, bruising.     REVIEW OF SYSTEMS (Negative unless  checked)  Constitutional: '[]'$ Weight loss  '[]'$ Fever  '[]'$ Chills Cardiac: '[]'$ Chest pain   '[]'$ Chest pressure   '[]'$ Palpitations   '[]'$ Shortness of breath when laying flat   '[]'$ Shortness of breath with exertion. Vascular:  '[x]'$ Pain in legs with walking   '[]'$ Pain in legs at rest  '[]'$ History of DVT   '[]'$ Phlebitis   '[]'$ Swelling in legs   '[]'$ Varicose veins   '[]'$ Non-healing ulcers Pulmonary:   '[]'$ Uses home oxygen   '[]'$ Productive cough   '[]'$ Hemoptysis   '[]'$ Wheeze  '[]'$ COPD   '[]'$ Asthma Neurologic:  '[]'$ Dizziness   '[]'$ Seizures   '[]'$ History of stroke   '[]'$ History of TIA  '[]'$   Aphasia   '[]'$ Vissual changes   '[]'$ Weakness or numbness in arm   '[]'$ Weakness or numbness in leg Musculoskeletal:   '[]'$ Joint swelling   '[]'$ Joint pain   '[]'$ Low back pain Hematologic:  '[]'$ Easy bruising  '[]'$ Easy bleeding   '[]'$ Hypercoagulable state   '[]'$ Anemic Gastrointestinal:  '[]'$ Diarrhea   '[]'$ Vomiting  '[]'$ Gastroesophageal reflux/heartburn   '[]'$ Difficulty swallowing. Genitourinary:  '[]'$ Chronic kidney disease   '[]'$ Difficult urination  '[]'$ Frequent urination   '[]'$ Blood in urine Skin:  '[]'$ Rashes   '[]'$ Ulcers  Psychological:  '[]'$ History of anxiety   '[]'$  History of major depression.  Physical Examination  There were no vitals filed for this visit. There is no height or weight on file to calculate BMI. Gen: WD/WN, NAD Head: [place]/AT, No temporalis wasting.  Ear/Nose/Throat: Hearing grossly intact, nares w/o erythema or drainage Eyes: PER, EOMI, sclera nonicteric.  Neck: Supple, no masses.  No bruit or JVD.  Pulmonary:  Good air movement, no audible wheezing, no use of accessory muscles.  Cardiac: RRR, normal S1, S2, no Murmurs. Vascular:  2-3+ edema of the right leg with severe venous changes of the right leg.  Venous ulcer noted in the ankle area on the right, noninfected Vessel Right Left  Radial Palpable Palpable  PT Not Palpable Not Palpable  DP Not Palpable Not Palpable  Gastrointestinal: soft, non-distended. No guarding/no peritoneal signs.  Musculoskeletal: M/S 5/5 throughout.  No visible  deformity.  Neurologic: CN 2-12 intact. Pain and light touch intact in extremities.  Symmetrical.  Speech is fluent. Motor exam as listed above. Psychiatric: Judgment intact, Mood & affect appropriate for pt's clinical situation. Dermatologic: Venous stasis dermatitis with ulcers present on the right.  No changes consistent with cellulitis.   CBC Lab Results  Component Value Date   WBC 7.2 12/12/2021   HGB 10.5 (L) 12/12/2021   HCT 36.3 12/12/2021   MCV 81 12/12/2021   PLT 310 12/12/2021    BMET    Component Value Date/Time   NA 143 12/12/2021 1116   NA 136 07/09/2014 0529   K 4.4 12/12/2021 1116   K 4.6 07/09/2014 0529   CL 104 12/12/2021 1116   CL 104 07/09/2014 0529   CO2 23 12/12/2021 1116   CO2 28 07/09/2014 0529   GLUCOSE 146 (H) 12/12/2021 1116   GLUCOSE 124 (H) 08/28/2021 1553   GLUCOSE 169 (H) 07/09/2014 0529   BUN 20 12/12/2021 1116   BUN 20 07/09/2014 0529   CREATININE 1.01 (H) 12/12/2021 1116   CREATININE 0.85 07/09/2014 0529   CALCIUM 9.0 12/12/2021 1116   CALCIUM 8.6 (L) 07/09/2014 0529   GFRNONAA 48 (L) 08/28/2021 1553   GFRNONAA >60 07/09/2014 0529   GFRAA 84 07/29/2019 0936   GFRAA >60 07/09/2014 0529   CrCl cannot be calculated (Patient's most recent lab result is older than the maximum 21 days allowed.).  COAG Lab Results  Component Value Date   INR 1.0 05/24/2020   INR 1.0 06/23/2014   INR 0.9 12/11/2012    Radiology No results found.   Assessment/Plan 1. Atherosclerosis of native arteries of the extremities with ulceration ([place])  Recommend:  The patient has evidence of atherosclerosis of the lower extremities with claudication and an ulceration.  The patient does not voice lifestyle limiting changes at this point in time and the ulcer appears more venous in nature.  Noninvasive studies show moderate ASO.  No invasive studies, angiography or surgery at this time but if she does not heal then intervention will be needed.  The patient  should continue walking and begin a more formal exercise program.  The patient should continue antiplatelet therapy and aggressive treatment of the lipid abnormalities  No changes in the patient's medications at this time  Continued surveillance is indicated as atherosclerosis is likely to progress with time.    The patient will continue follow up with noninvasive studies as ordered.   2. Venous ulcer of ankle, right ([place]) See Number 1 & 3  3. Lymphedema No surgery or intervention at this point in time.   The patient is CEAP [id number]   I have discussed with the patient venous insufficiency and why it  causes symptoms. I have discussed with the patient the chronic skin changes that accompany venous insufficiency and the long term sequela such as infection and ulceration.  Patient will begin wearing graduated compression stockings or compression wraps on a daily basis.  The patient will put the compression on first thing in the morning and removing them in the evening. The patient is instructed specifically not to sleep in the compression.    In addition, behavioral modification including several periods of elevation of the lower extremities during the day will be continued. I have demonstrated that proper elevation is a position with the ankles at heart level.  The patient is instructed to begin routine exercise, especially walking on a daily basis  Patient should undergo duplex ultrasound of the venous system to ensure that DVT or reflux is not present.  Following the review of the ultrasound the patient will follow up in 2-3 months to reassess the degree of swelling and the control that graduated compression stockings or compression wraps  is offering.   At that time the patient can be assessed for a Lymph Pump depending on the effectiveness of conservative therapy and the control of the associated lymphedema.  4. Essential hypertension Continue antihypertensive medications as already  ordered, these medications have been reviewed and there are no changes at this time.  5. COPD, mild ([place]) Continue pulmonary medications and aerosols as already ordered, these medications have been reviewed and there are no changes at this time.   6. Type 2 diabetes mellitus without complication, without long-term current use of insulin (HCC) Continue hypoglycemic medications as already ordered, these medications have been reviewed and there are no changes at this time.  Hgb A1C to be monitored as already arranged by primary service  7. Hypercholesteremia Continue statin as ordered and reviewed, no changes at this time    Hortencia Pilar, MD  02/24/2022 2:41 PM

## 2022-02-26 ENCOUNTER — Ambulatory Visit (INDEPENDENT_AMBULATORY_CARE_PROVIDER_SITE_OTHER): Payer: Medicare Other | Admitting: Vascular Surgery

## 2022-02-26 ENCOUNTER — Telehealth: Payer: Self-pay | Admitting: Family Medicine

## 2022-02-26 ENCOUNTER — Other Ambulatory Visit: Payer: Self-pay

## 2022-02-26 ENCOUNTER — Encounter (INDEPENDENT_AMBULATORY_CARE_PROVIDER_SITE_OTHER): Payer: Self-pay | Admitting: Vascular Surgery

## 2022-02-26 VITALS — BP 117/66 | HR 64 | Resp 16 | Ht 64.0 in | Wt 179.0 lb

## 2022-02-26 DIAGNOSIS — J449 Chronic obstructive pulmonary disease, unspecified: Secondary | ICD-10-CM

## 2022-02-26 DIAGNOSIS — I10 Essential (primary) hypertension: Secondary | ICD-10-CM

## 2022-02-26 DIAGNOSIS — I7025 Atherosclerosis of native arteries of other extremities with ulceration: Secondary | ICD-10-CM | POA: Diagnosis not present

## 2022-02-26 DIAGNOSIS — M1990 Unspecified osteoarthritis, unspecified site: Secondary | ICD-10-CM

## 2022-02-26 DIAGNOSIS — L97319 Non-pressure chronic ulcer of right ankle with unspecified severity: Secondary | ICD-10-CM

## 2022-02-26 DIAGNOSIS — I83013 Varicose veins of right lower extremity with ulcer of ankle: Secondary | ICD-10-CM

## 2022-02-26 DIAGNOSIS — E119 Type 2 diabetes mellitus without complications: Secondary | ICD-10-CM

## 2022-02-26 DIAGNOSIS — E039 Hypothyroidism, unspecified: Secondary | ICD-10-CM

## 2022-02-26 DIAGNOSIS — I890 Lymphedema, not elsewhere classified: Secondary | ICD-10-CM

## 2022-02-26 DIAGNOSIS — E7800 Pure hypercholesterolemia, unspecified: Secondary | ICD-10-CM | POA: Diagnosis not present

## 2022-02-26 MED ORDER — LEVOTHYROXINE SODIUM 100 MCG PO TABS: ORAL_TABLET | ORAL | 1 refills | Status: DC

## 2022-02-26 MED ORDER — TORSEMIDE 40 MG PO TABS: 40.0000 mg | ORAL_TABLET | Freq: Every day | ORAL | 5 refills | Status: DC

## 2022-02-26 NOTE — Telephone Encounter (Signed)
[facility] faxed refill request for the following medications:    levothyroxine (SYNTHROID) 100 MCG tablet   Please advise

## 2022-02-27 ENCOUNTER — Encounter (INDEPENDENT_AMBULATORY_CARE_PROVIDER_SITE_OTHER): Payer: Medicare Other | Admitting: Vascular Surgery

## 2022-03-04 ENCOUNTER — Encounter (INDEPENDENT_AMBULATORY_CARE_PROVIDER_SITE_OTHER): Payer: Self-pay | Admitting: Vascular Surgery

## 2022-03-07 ENCOUNTER — Other Ambulatory Visit: Payer: Self-pay | Admitting: Family

## 2022-03-07 ENCOUNTER — Other Ambulatory Visit: Payer: Self-pay | Admitting: Family Medicine

## 2022-03-07 MED ORDER — HYDROXYZINE HCL 10 MG PO TABS: ORAL_TABLET | ORAL | 0 refills | Status: DC

## 2022-03-07 NOTE — Telephone Encounter (Signed)
[facility] faxed refill request for the following medications:   hydrOXYzine (ATARAX) 10 MG tablet     Please advise

## 2022-03-08 ENCOUNTER — Encounter: Payer: Medicare Other | Attending: Physician Assistant | Admitting: Physician Assistant

## 2022-03-08 DIAGNOSIS — E11621 Type 2 diabetes mellitus with foot ulcer: Secondary | ICD-10-CM | POA: Diagnosis not present

## 2022-03-08 DIAGNOSIS — I5042 Chronic combined systolic (congestive) and diastolic (congestive) heart failure: Secondary | ICD-10-CM | POA: Diagnosis not present

## 2022-03-08 DIAGNOSIS — L97512 Non-pressure chronic ulcer of other part of right foot with fat layer exposed: Secondary | ICD-10-CM | POA: Diagnosis not present

## 2022-03-08 DIAGNOSIS — L97812 Non-pressure chronic ulcer of other part of right lower leg with fat layer exposed: Secondary | ICD-10-CM | POA: Insufficient documentation

## 2022-03-08 DIAGNOSIS — I110 Hypertensive heart disease with heart failure: Secondary | ICD-10-CM | POA: Diagnosis not present

## 2022-03-08 DIAGNOSIS — E1151 Type 2 diabetes mellitus with diabetic peripheral angiopathy without gangrene: Secondary | ICD-10-CM | POA: Diagnosis not present

## 2022-03-08 DIAGNOSIS — L97522 Non-pressure chronic ulcer of other part of left foot with fat layer exposed: Secondary | ICD-10-CM | POA: Insufficient documentation

## 2022-03-08 DIAGNOSIS — M6281 Muscle weakness (generalized): Secondary | ICD-10-CM | POA: Diagnosis not present

## 2022-03-08 NOTE — Progress Notes (Addendum)
Kathryn [name] ([id number]) [id number] Page 1 of 18 Visit Report for 03/08/2022 Arrival Information Details Patient Name: Date of Service: Kathryn [name], [place] [name] 03/08/2022 10:45 A M Medical Record Number: [id number] Patient Account Number: [id number] Date of Birth/Sex: Treating RN: 1933-05-02 (88 y.o. Kathryn [name] Primary Care Kathryn [name]: Kathryn [name] Other Clinician: Referring Kathryn [name]: Treating Kathryn [name]/Extender: Kathryn [name] in Treatment: 4 Visit Information History Since Last Visit Added or deleted any medications: No Patient Arrived: Wheel Chair Any new allergies or adverse reactions: No Arrival Time: 10:43 Had a fall or experienced change in No Accompanied By: sister activities of daily living that may affect Transfer Assistance: None risk of falls: Patient Requires Transmission-Based Precautions: No Hospitalized since last visit: No Patient Has Alerts: Yes Pain Present Now: No Patient Alerts: Patient on Blood Thinner 92m daily Type II Diabees Electronic Signature(s) Signed: 03/08/2022 5:27:53 PM By: Kathryn LoudMSN RN CNS WTA Entered By: Kathryn [name] 03/08/2022 10:45:07 -------------------------------------------------------------------------------- Clinic Level of Care Assessment Details Patient Name: Date of Service: [name] 03/08/2022 10:45 A M Medical Record Number: [id number] Account Number: [id number] of Birth/Sex: Treating RN: 6Mar 23, 1935(88y.o. FDrema PryPrimary Care Kathryn [name]: RRory PercyOther Clinician: Referring Kathryn [name]: Treating Kathryn [name]/Extender: SLonell Facein Treatment: 4 Clinic Level of Care Assessment Items TOOL 4 Quantity Score X- 1 0 Use when only an EandM is performed on FOLLOW-UP visit ASSESSMENTS - Nursing Assessment / Reassessment X- 1 10 Reassessment of Co-morbidities (includes updates in patient status) X- 1 5 Reassessment  of Adherence to Treatment Plan ASSESSMENTS - Wound and Skin A ssessment / Reassessment '[]'  - 0 Simple Wound Assessment / Reassessment - one wound X- 2 5 Complex Wound Assessment / Reassessment - multiple wounds Kathryn [name], Kathryn [name] ([id number] Page 2 of 18 '[]'  - 0 Dermatologic / Skin Assessment (not related to wound area) ASSESSMENTS - Focused Assessment '[]'  - 0 Circumferential Edema Measurements - multi extremities '[]'  - 0 Nutritional Assessment / Counseling / Intervention '[]'  - 0 Lower Extremity Assessment (monofilament, tuning fork, pulses) '[]'  - 0 Peripheral Arterial Disease Assessment (using hand held doppler) ASSESSMENTS - Ostomy and/or Continence Assessment and Care '[]'  - 0 Incontinence Assessment and Management '[]'  - 0 Ostomy Care Assessment and Management (repouching, etc.) PROCESS - Coordination of Care X - Simple Patient / Family Education for ongoing care 1 15 '[]'  - 0 Complex (extensive) Patient / Family Education for ongoing care X- 1 10 Staff obtains CProgrammer, systems Records, T Results / Process Orders est '[]'  - 0 Staff telephones HHA, Nursing Homes / Clarify orders / etc '[]'  - 0 Routine Transfer to another Facility (non-emergent condition) '[]'  - 0 Routine Hospital Admission (non-emergent condition) '[]'  - 0 New Admissions / IBiomedical engineer/ Ordering NPWT Apligraf, etc. , '[]'  - 0 Emergency Hospital Admission (emergent condition) X- 1 10 Simple Discharge Coordination '[]'  - 0 Complex (extensive) Discharge Coordination PROCESS - Special Needs '[]'  - 0 Pediatric / Minor Patient Management '[]'  - 0 Isolation Patient Management '[]'  - 0 Hearing / Language / Visual special needs '[]'  - 0 Assessment of Community assistance (transportation, D/C planning, etc.) '[]'  - 0 Additional assistance / Altered mentation '[]'  - 0 Support Surface(s) Assessment (bed, cushion, seat, etc.) INTERVENTIONS - Wound Cleansing / Measurement '[]'  - 0 Simple Wound  Cleansing - one wound X- 2 5 Complex Wound Cleansing - multiple wounds X- 1 5 Wound Imaging (photographs - any number of wounds) '[]'  - 0  Wound Tracing (instead of photographs) '[]'  - 0 Simple Wound Measurement - one wound X- 2 5 Complex Wound Measurement - multiple wounds INTERVENTIONS - Wound Dressings X - Small Wound Dressing one or multiple wounds 2 10 '[]'  - 0 Medium Wound Dressing one or multiple wounds '[]'  - 0 Large Wound Dressing one or multiple wounds '[]'  - 0 Application of Medications - topical '[]'  - 0 Application of Medications - injection INTERVENTIONS - Miscellaneous '[]'  - 0 External ear exam '[]'  - 0 Specimen Collection (cultures, biopsies, blood, body fluids, etc.) '[]'  - 0 Specimen(s) / Culture(s) sent or taken to Lab for analysis Kathryn [name], Kathryn [name] ([id number]) [id number] Page 3 of 18 '[]'  - 0 Patient Transfer (multiple staff / Civil Service fast streamer / Similar devices) '[]'  - 0 Simple Staple / Suture removal (25 or less) '[]'  - 0 Complex Staple / Suture removal (26 or more) '[]'  - 0 Hypo / Hyperglycemic Management (close monitor of Blood Glucose) '[]'  - 0 Ankle / Brachial Index (ABI) - do not check if billed separately X- 1 5 Vital Signs Has the patient been seen at the hospital within the last three years: Yes Total Score: 110 Level Of Care: New/Established - Level 3 Electronic Signature(s) Signed: 03/08/2022 5:27:53 PM By: Kathryn Loud MSN RN CNS WTA Entered By: Kathryn [name] on 03/08/2022 12:12:41 -------------------------------------------------------------------------------- Encounter Discharge Information Details Patient Name: Date of Service: Kathryn [name], [name] 03/08/2022 10:45 A M Medical Record Number: [id number] Patient Account Number: [id number] Date of Birth/Sex: Treating RN: 1933-10-13 (88 y.o. Kathryn [name] Primary Care Jaelin Devincentis: Kathryn [name] Other Clinician: Referring Kathryn [name]: Treating Kathryn [name]/Extender: Kathryn [name] in Treatment: 4 Encounter Discharge Information Items Post Procedure Vitals Discharge Condition: Stable Temperature (F): 97.7 Ambulatory Status: Wheelchair Pulse (bpm): 68 Discharge Destination: Home Respiratory Rate (breaths/min): 16 Transportation: Private Auto Blood Pressure (mmHg): 147/63 Accompanied By: sister Schedule Follow-up Appointment: Yes Clinical Summary of Care: Electronic Signature(s) Signed: 03/08/2022 12:14:06 PM By: Kathryn Loud MSN RN CNS WTA Entered By: Kathryn [name] on 03/08/2022 12:14:06 -------------------------------------------------------------------------------- Lower Extremity Assessment Details Patient Name: Date of Service: Kathryn [name], [name] 03/08/2022 10:45 A M Medical Record Number: [id number] Patient Account Number: [id number] Date of Birth/Sex: Treating RN: 21-May-1933 (88 y.o. Kathryn [name] Primary Care Jaimon Bugaj: Kathryn [name] Other Clinician: Referring Eran Mistry: Treating Brittannie Tawney/Extender: Juanito Doom Kathryn [name], Kathryn [name] ([id number]) [phone].pdf Page 4 of 18 Weeks in Treatment: 4 Edema Assessment Assessed: [Left: No] [Right: No] [Left: Edema] [Right: :] Calf Left: Right: Point of Measurement: 31 cm From Medial Instep 36.6 cm 35 cm Ankle Left: Right: Point of Measurement: 12 cm From Medial Instep 26.8 cm 24.1 cm Vascular Assessment Pulses: Dorsalis Pedis Palpable: [Left:Yes] [Right:Yes] Electronic Signature(s) Signed: 03/08/2022 5:27:53 PM By: Kathryn Loud MSN RN CNS WTA Entered By: Kathryn [name] on 03/08/2022 11:13:33 -------------------------------------------------------------------------------- Multi Wound Chart Details Patient Name: Date of Service: Kathryn [name], [name] 03/08/2022 10:45 A M Medical Record Number: [id number] Patient Account Number: [id number] Date of Birth/Sex: Treating RN: [birth year] (88 y.o. Kathryn [name] Primary Care Gable Odonohue: Kathryn [name] Other  Clinician: Referring Raedyn Klinck: Treating Rylyn Ranganathan/Extender: Kathryn [name] in Treatment: 4 Vital Signs Height(in): 64 Pulse(bpm): 72 Weight(lbs): 178 Blood Pressure(mmHg): 109/67 Body Mass Index(BMI): 30.6 Temperature(F): 97.8 Respiratory Rate(breaths/min): 18 [1:Photos:] Left T Fourth oe Left, Lateral Foot Right, Lateral Lower Leg Wound Location: Trauma Gradually Appeared Gradually Appeared Wounding Event: Diabetic Wound/Ulcer of the Lower Diabetic Wound/Ulcer of the Lower Diabetic Wound/Ulcer of the Lower Primary Etiology:  Extremity Extremity Extremity Chronic Obstructive Pulmonary Chronic Obstructive Pulmonary Chronic Obstructive Pulmonary Comorbid History: Disease (COPD), Congestive Heart Disease (COPD), Congestive Heart Disease (COPD), Congestive Heart Failure, Coronary Artery Disease, Failure, Coronary Artery Disease, Failure, Coronary Artery Disease, Kathryn [name], Kathryn [name] ([id number]) [id number] Page 5 of 18 Hypertension, Peripheral Arterial Hypertension, Peripheral Arterial Hypertension, Peripheral Arterial Disease, Peripheral Venous Disease, Disease, Peripheral Venous Disease, Disease, Peripheral Venous Disease, Type II Diabetes, Rheumatoid Arthritis, Type II Diabetes, Rheumatoid Arthritis, Type II Diabetes, Rheumatoid Arthritis, Neuropathy Neuropathy Neuropathy 01/22/2022 01/22/2022 01/22/2022 Date Acquired: '4 4 4 ' Weeks of Treatment: Open Healed - Epithelialized Healed - Epithelialized Wound Status: No No No Wound Recurrence: 0.6x0.6x0.1 0x0x0 0x0x0 Measurements [name] x W x D (cm) 0.283 0 0 A (cm) : rea 0.028 0 0 Volume (cm) : 42.80% 100.00% 100.00% % Reduction in A rea: 42.90% 100.00% 100.00% % Reduction in Volume: Grade 1 Grade 1 Grade 1 Classification: Medium Medium Medium Exudate A mount: Serous Serous Serous Exudate Type: amber amber amber Exudate Color: None Present (0%) None Present (0%) None Present  (0%) Granulation A mount: N/A N/A N/A Granulation Quality: Large (67-100%) Large (67-100%) Large (67-100%) Necrotic A mount: Adherent Slough N/A Eschar Necrotic Tissue: Fat Layer (Subcutaneous Tissue): Yes Fat Layer (Subcutaneous Tissue): Yes Fat Layer (Subcutaneous Tissue): Yes Exposed Structures: Fascia: No Fascia: No Fascia: No Tendon: No Tendon: No Tendon: No Muscle: No Muscle: No Muscle: No Joint: No Joint: No Joint: No Bone: No Bone: No Bone: No N/A N/A N/A Epithelialization: Wound Number: '4 5 6 ' Photos: Right, Proximal Lower Leg Right, Distal, Posterior Lower Leg Right, Proximal, Posterior Lower Leg Wound Location: Gradually Appeared Gradually Appeared Gradually Appeared Wounding Event: Diabetic Wound/Ulcer of the Lower Diabetic Wound/Ulcer of the Lower Diabetic Wound/Ulcer of the Lower Primary Etiology: Extremity Extremity Extremity Chronic Obstructive Pulmonary Chronic Obstructive Pulmonary Chronic Obstructive Pulmonary Comorbid History: Disease (COPD), Congestive Heart Disease (COPD), Congestive Heart Disease (COPD), Congestive Heart Failure, Coronary Artery Disease, Failure, Coronary Artery Disease, Failure, Coronary Artery Disease, Hypertension, Peripheral Arterial Hypertension, Peripheral Arterial Hypertension, Peripheral Arterial Disease, Peripheral Venous Disease, Disease, Peripheral Venous Disease, Disease, Peripheral Venous Disease, Type II Diabetes, Rheumatoid Arthritis, Type II Diabetes, Rheumatoid Arthritis, Type II Diabetes, Rheumatoid Arthritis, Neuropathy Neuropathy Neuropathy 01/15/2022 01/15/2022 12/18/2021 Date Acquired: '4 4 4 ' Weeks of Treatment: Healed - Epithelialized Healed - Epithelialized Healed - Epithelialized Wound Status: No No No Wound Recurrence: 0x0x0 0x0x0 0x0x0 Measurements [name] x W x D (cm) 0 0 0 A (cm) : rea 0 0 0 Volume (cm) : 100.00% 100.00% 100.00% % Reduction in A rea: 100.00% 100.00% 100.00% % Reduction in  Volume: Grade 1 Grade 1 Grade 1 Classification: Medium Medium Medium Exudate A mount: Serous Serous Serous Exudate Type: amber amber amber Exudate Color: Small (1-33%) None Present (0%) None Present (0%) Granulation A mount: N/A N/A N/A Granulation Quality: Large (67-100%) Large (67-100%) Large (67-100%) Necrotic A mount: N/A N/A N/A Necrotic Tissue: Fat Layer (Subcutaneous Tissue): Yes Fat Layer (Subcutaneous Tissue): Yes Fat Layer (Subcutaneous Tissue): Yes Exposed Structures: Fascia: No Fascia: No Tendon: No Tendon: No Muscle: No Muscle: No Joint: No Joint: No Bone: No Bone: No N/A N/A N/A Epithelialization: Wound Number: 7 8 N/A Photos: N/A Kathryn [name], Kathryn [name] ([id number]) [id number] Page 6 of 18 Right T Second oe Left, Medial Lower Leg N/A Wound Location: Gradually Appeared Laceration N/A Wounding Event: Diabetic Wound/Ulcer of the Lower Abrasion N/A Primary Etiology: Extremity Chronic Obstructive Pulmonary Chronic Obstructive Pulmonary N/A Comorbid History: Disease (COPD), Congestive Heart Disease (COPD), Congestive Heart  Failure, Coronary Artery Disease, Failure, Coronary Artery Disease, Hypertension, Peripheral Arterial Hypertension, Peripheral Arterial Disease, Peripheral Venous Disease, Disease, Peripheral Venous Disease, Type II Diabetes, Rheumatoid Arthritis, Type II Diabetes, Rheumatoid Arthritis, Neuropathy Neuropathy 01/15/2022 02/04/2022 N/A Date Acquired: 4 2 N/A Weeks of Treatment: Open Healed - Epithelialized N/A Wound Status: No No N/A Wound Recurrence: 0.4x0.6x0.1 0x0x0 N/A Measurements [name] x W x D (cm) 0.188 0 N/A A (cm) : rea 0.019 0 N/A Volume (cm) : 4.10% 100.00% N/A % Reduction in A rea: 51.30% 100.00% N/A % Reduction in Volume: Grade 1 Partial Thickness N/A Classification: Medium Small N/A Exudate A mount: Serous Serous N/A Exudate Type: amber amber N/A Exudate Color: None Present (0%) Small  (1-33%) N/A Granulation A mount: N/A Red N/A Granulation Quality: Large (67-100%) None Present (0%) N/A Necrotic A mount: Adherent Slough N/A N/A Necrotic Tissue: Fat Layer (Subcutaneous Tissue): Yes Fat Layer (Subcutaneous Tissue): Yes N/A Exposed Structures: Fascia: No Fascia: No Tendon: No Tendon: No Muscle: No Muscle: No Joint: No Joint: No Bone: No Bone: No N/A Small (1-33%) N/A Epithelialization: Treatment Notes Electronic Signature(s) Signed: 03/08/2022 5:27:53 PM By: Kathryn Loud MSN RN CNS WTA Entered By: Kathryn [name] on 03/08/2022 11:30:52 -------------------------------------------------------------------------------- Multi-Disciplinary Care Plan Details Patient Name: Date of Service: Kathryn [name], [name] 03/08/2022 10:45 A M Medical Record Number: [id number] Patient Account Number: [id number] Date of Birth/Sex: Treating RN: 07-06-1933 (88 y.o. Kathryn [name] Primary Care Kimbely Whiteaker: Kathryn [name] Other Clinician: Referring Denita Lun: Treating Shenna Brissette/Extender: Kathryn [name] in Treatment: 4 Active Inactive Electronic Signature(s) Signed: 04/02/2022 1:27:22 PM By: Gretta Cool BSN, RN, CWS, Kim RN, BSN Signed: 04/25/2022 4:51:59 PM By: Kathryn Loud MSN RN CNS WTA Previous Signature: 03/08/2022 12:13:38 PM Version By: Kathryn Loud MSN RN CNS WTA Entered By: Gretta Cool, BSN, RN, CWS, Kim on 04/02/2022 13:27:22 Kathryn [name] ([id number]) [id number] Page 7 of 18 -------------------------------------------------------------------------------- Pain Assessment Details Patient Name: Date of Service: Kathryn [name] [name] 03/08/2022 10:45 A M Medical Record Number: [id number] Patient Account Number: [id number] Date of Birth/Sex: Treating RN: [birth year] (88 y.o. Kathryn [name] Primary Care Eimy Plaza: Kathryn [name] Other Clinician: Referring Lafe Clerk: Treating Shean Gerding/Extender: Kathryn [name] in Treatment:  4 Active Problems Location of Pain Severity and Description of Pain Patient Has Paino No Site Locations Pain Management and Medication Current Pain Management: Electronic Signature(s) Signed: 03/08/2022 5:27:53 PM By: Kathryn Loud MSN RN CNS WTA Entered By: Kathryn [name] on 03/08/2022 10:47:16 -------------------------------------------------------------------------------- Patient/Caregiver Education Details Patient Name: Date of Service: Kathryn [name], [name] 12/14/2023andnbsp10:45 A M Medical Record Number: [id number] Patient Account Number: [id number] Date of Birth/Gender: Treating RN: [birth year] (88 y.o. Kathryn [name] Primary Care Physician: Kathryn [name] Other Clinician: Referring Physician: Treating Physician/Extender: Kathryn [name] in Treatment: [place], [place] ([id number]) [id number] Page 8 of 18 Education Assessment Education Provided To: Patient Education Topics Provided Wound Debridement: Handouts: Wound Debridement Methods: Explain/Verbal Responses: State content correctly Electronic Signature(s) Signed: 03/08/2022 5:27:53 PM By: Kathryn Loud MSN RN CNS WTA Entered By: Kathryn [name] on 03/08/2022 12:12:57 -------------------------------------------------------------------------------- Wound Assessment Details Patient Name: Date of Service: Kathryn [name], [name] 03/08/2022 10:45 A M Medical Record Number: [id number] Patient Account Number: [id number] Date of Birth/Sex: Treating RN: 1933/09/07 (88 y.o. Kathryn [name] Primary Care Tiwanda Threats: Kathryn [name] Other Clinician: Referring Shlok Raz: Treating Sholanda Croson/Extender: Kathryn [name] in Treatment: 4 Wound Status Wound Number: 1 Primary Diabetic Wound/Ulcer of the Lower Extremity Etiology: Wound Location: Left T  Fourth oe Wound Open Wounding Event: Trauma Status: Date Acquired: 01/22/2022 Comorbid Chronic Obstructive Pulmonary Disease  (COPD), Congestive Heart Weeks Of Treatment: 4 History: Failure, Coronary Artery Disease, Hypertension, Peripheral Arterial Clustered Wound: No Disease, Peripheral Venous Disease, Type II Diabetes, Rheumatoid Arthritis, Neuropathy Photos Wound Measurements Length: (cm) 0.6 Width: (cm) 0.6 Depth: (cm) 0.1 Area: (cm) 0.283 Volume: (cm) 0.028 % Reduction in Area: 42.8% % Reduction in Volume: 42.9% Wound Description Classification: Grade 1 Exudate Amount: Medium Nolte, Adline [name] ([id number]) Exudate Type: Serous Exudate Color: amber Foul Odor After Cleansing: No Slough/Fibrino Yes [id number] Page 9 of 18 Wound Bed Granulation Amount: None Present (0%) Exposed Structure Necrotic Amount: Large (67-100%) Fascia Exposed: No Necrotic Quality: Adherent Slough Fat Layer (Subcutaneous Tissue) Exposed: Yes Tendon Exposed: No Muscle Exposed: No Joint Exposed: No Bone Exposed: No Electronic Signature(s) Signed: 03/08/2022 5:27:53 PM By: Kathryn Loud MSN RN CNS WTA Entered By: Kathryn [name] on 03/08/2022 10:56:15 -------------------------------------------------------------------------------- Wound Assessment Details Patient Name: Date of Service: Kathryn [name], [name] 03/08/2022 10:45 A M Medical Record Number: [id number] Patient Account Number: [id number] Date of Birth/Sex: Treating RN: 1933-05-31 (88 y.o. Kathryn [name] Primary Care Jaythan Hinely: Kathryn [name] Other Clinician: Referring Guynell Kleiber: Treating Sheila Ocasio/Extender: Kathryn [name] in Treatment: 4 Wound Status Wound Number: 2 Primary Diabetic Wound/Ulcer of the Lower Extremity Etiology: Wound Location: Left, Lateral Foot Wound Healed - Epithelialized Wounding Event: Gradually Appeared Status: Date Acquired: 01/22/2022 Comorbid Chronic Obstructive Pulmonary Disease (COPD), Congestive Heart Weeks Of Treatment: 4 History: Failure, Coronary Artery Disease, Hypertension,  Peripheral Arterial Clustered Wound: No Disease, Peripheral Venous Disease, Type II Diabetes, Rheumatoid Arthritis, Neuropathy Photos Wound Measurements Length: (cm) Width: (cm) Depth: (cm) Area: (cm) Volume: (cm) 0 % Reduction in Area: 100% 0 % Reduction in Volume: 100% 0 0 0 Wound Description Classification: Grade 1 Exudate Amount: Medium Exudate Type: Serous Kathryn [name], Kathryn [name] ([id number]) Exudate Color: amber Foul Odor After Cleansing: No Slough/Fibrino Yes [id number] Page 10 of 18 Wound Bed Granulation Amount: None Present (0%) Exposed Structure Necrotic Amount: Large (67-100%) Fascia Exposed: No Fat Layer (Subcutaneous Tissue) Exposed: Yes Tendon Exposed: No Muscle Exposed: No Joint Exposed: No Bone Exposed: No Treatment Notes Wound #2 (Foot) Wound Laterality: Left, Lateral Cleanser Peri-Wound Care Topical Primary Dressing Secondary Dressing Secured With Compression Wrap Compression Stockings Add-Ons Electronic Signature(s) Signed: 03/08/2022 5:27:53 PM By: Kathryn Loud MSN RN CNS WTA Entered By: Kathryn [name] on 03/08/2022 11:09:28 -------------------------------------------------------------------------------- Wound Assessment Details Patient Name: Date of Service: Kathryn [name], [name] 03/08/2022 10:45 A M Medical Record Number: [id number] Patient Account Number: [id number] Date of Birth/Sex: Treating RN: June 21, 1933 (88 y.o. Kathryn [name] Primary Care Fernando Stoiber: Kathryn [name] Other Clinician: Referring Gaven Eugene: Treating Seirra Kos/Extender: Kathryn [name] in Treatment: 4 Wound Status Wound Number: 3 Primary Diabetic Wound/Ulcer of the Lower Extremity Etiology: Wound Location: Right, Lateral Lower Leg Wound Healed - Epithelialized Wounding Event: Gradually Appeared Status: Date Acquired: 01/22/2022 Comorbid Chronic Obstructive Pulmonary Disease (COPD), Congestive Heart Weeks Of Treatment: 4 History:  Failure, Coronary Artery Disease, Hypertension, Peripheral Arterial Clustered Wound: No Disease, Peripheral Venous Disease, Type II Diabetes, Rheumatoid Arthritis, Neuropathy Photos Kathryn [name], Kathryn [name] ([id number]) [id number] Page 11 of 18 Wound Measurements Length: (cm) Width: (cm) Depth: (cm) Area: (cm) Volume: (cm) 0 % Reduction in Area: 100% 0 % Reduction in Volume: 100% 0 0 0 Wound Description Classification: Grade 1 Exudate Amount: Medium Exudate Type: Serous Exudate Color: amber Foul Odor After Cleansing: No Slough/Fibrino No Wound Bed Granulation Amount:  None Present (0%) Exposed Structure Necrotic Amount: Large (67-100%) Fascia Exposed: No Necrotic Quality: Eschar Fat Layer (Subcutaneous Tissue) Exposed: Yes Tendon Exposed: No Muscle Exposed: No Joint Exposed: No Bone Exposed: No Treatment Notes Wound #3 (Lower Leg) Wound Laterality: Right, Lateral Cleanser Peri-Wound Care Topical Primary Dressing Secondary Dressing Secured With Compression Wrap Compression Stockings Add-Ons Electronic Signature(s) Signed: 03/08/2022 5:27:53 PM By: Kathryn Loud MSN RN CNS WTA Entered By: Kathryn [name] on 03/08/2022 11:11:51 Wound Assessment Details -------------------------------------------------------------------------------- Kathryn [name] ([id number]) [id number] Page 12 of 18 Patient Name: Date of Service: Kathryn [name], [place] [name] 03/08/2022 10:45 A M Medical Record Number: [id number] Patient Account Number: [id number] Date of Birth/Sex: Treating RN: Jun 15, 1933 (88 y.o. Kathryn [name] Primary Care Corianna Avallone: Kathryn [name] Other Clinician: Referring Cari Vandeberg: Treating Kaylee Wombles/Extender: Kathryn [name] in Treatment: 4 Wound Status Wound Number: 4 Primary Diabetic Wound/Ulcer of the Lower Extremity Etiology: Wound Location: Right, Proximal Lower Leg Wound Healed - Epithelialized Wounding Event:  Gradually Appeared Status: Date Acquired: 01/15/2022 Comorbid Chronic Obstructive Pulmonary Disease (COPD), Congestive Heart Weeks Of Treatment: 4 History: Failure, Coronary Artery Disease, Hypertension, Peripheral Arterial Clustered Wound: No Disease, Peripheral Venous Disease, Type II Diabetes, Rheumatoid Arthritis, Neuropathy Photos Wound Measurements Length: (cm) 0 % Reduction in Area: 100% Width: (cm) 0 % Reduction in Volume: 100% Depth: (cm) 0 Area: (cm) 0 Volume: (cm) 0 Wound Description Classification: Grade 1 Foul Odor After Cleansing: No Exudate Amount: Medium Slough/Fibrino No Exudate Type: Serous Exudate Color: amber Wound Bed Granulation Amount: Small (1-33%) Exposed Structure Necrotic Amount: Large (67-100%) Fascia Exposed: No Fat Layer (Subcutaneous Tissue) Exposed: Yes Tendon Exposed: No Muscle Exposed: No Joint Exposed: No Bone Exposed: No Treatment Notes Wound #4 (Lower Leg) Wound Laterality: Right, Proximal Cleanser Peri-Wound Care Topical Primary Dressing Secondary Dressing Secured With Compression Wrap Compression Stockings Add-Ons Electronic Signature(s) Signed: 03/08/2022 5:27:53 PM By: Kathryn Loud MSN RN CNS Kathryn [name], Kathryn [name] ([id number]) PM By: Kathryn Loud MSN RN CNS WTA [phone].pdf Page 13 of 18 Signed: 03/08/2022 5:27:53 Entered By: Kathryn [name] on 03/08/2022 11:11:51 -------------------------------------------------------------------------------- Wound Assessment Details Patient Name: Date of Service: Kathryn [name] [name] 03/08/2022 10:45 A M Medical Record Number: [id number] Patient Account Number: [id number] Date of Birth/Sex: Treating RN: November 01, 1933 (88 y.o. Kathryn [name] Primary Care Kemon Devincenzi: Kathryn [name] Other Clinician: Referring Kal Chait: Treating Kensie Susman/Extender: Kathryn [name] in Treatment: 4 Wound Status Wound Number: 5 Primary Diabetic Wound/Ulcer of the Lower  Extremity Etiology: Wound Location: Right, Distal, Posterior Lower Leg Wound Healed - Epithelialized Wounding Event: Gradually Appeared Status: Date Acquired: 01/15/2022 Comorbid Chronic Obstructive Pulmonary Disease (COPD), Congestive Heart Weeks Of Treatment: 4 History: Failure, Coronary Artery Disease, Hypertension, Peripheral Arterial Clustered Wound: No Disease, Peripheral Venous Disease, Type II Diabetes, Rheumatoid Arthritis, Neuropathy Photos Wound Measurements Length: (cm) Width: (cm) Depth: (cm) Area: (cm) Volume: (cm) 0 % Reduction in Area: 100% 0 % Reduction in Volume: 100% 0 0 0 Wound Description Classification: Grade 1 Exudate Amount: Medium Exudate Type: Serous Exudate Color: amber Wound Bed Granulation Amount: None Present (0%) Exposed Structure Necrotic Amount: Large (67-100%) Fat Layer (Subcutaneous Tissue) Exposed: Yes Treatment Notes Wound #5 (Lower Leg) Wound Laterality: Right, Posterior, Distal Cleanser Peri-Wound Care Topical Strike, Choua [name] ([id number]) [id number] Page 14 of 18 Primary Dressing Secondary Dressing Secured With Compression Wrap Compression Stockings Add-Ons Electronic Signature(s) Signed: 03/08/2022 5:27:53 PM By: Kathryn Loud MSN RN CNS WTA Entered By: Kathryn [name] on 03/08/2022 11:11:51 -------------------------------------------------------------------------------- Wound Assessment Details Patient Name: Date  of Service: Kathryn [name], [name] 03/08/2022 10:45 A M Medical Record Number: [id number] Patient Account Number: [id number] Date of Birth/Sex: Treating RN: [birth year] (88 y.o. Kathryn [name] Primary Care Corona Popovich: Kathryn [name] Other Clinician: Referring Vahe Pienta: Treating Deriona Altemose/Extender: Kathryn [name] in Treatment: 4 Wound Status Wound Number: 6 Primary Diabetic Wound/Ulcer of the Lower Extremity Etiology: Wound Location: Right, Proximal, Posterior Lower  Leg Wound Healed - Epithelialized Wounding Event: Gradually Appeared Status: Date Acquired: 12/18/2021 Comorbid Chronic Obstructive Pulmonary Disease (COPD), Congestive Heart Weeks Of Treatment: 4 History: Failure, Coronary Artery Disease, Hypertension, Peripheral Arterial Clustered Wound: No Disease, Peripheral Venous Disease, Type II Diabetes, Rheumatoid Arthritis, Neuropathy Photos Wound Measurements Length: (cm) Width: (cm) Depth: (cm) Area: (cm) Volume: (cm) 0 % Reduction in Area: 100% 0 % Reduction in Volume: 100% 0 0 0 Wound Description Classification: Grade 1 Exudate Amount: Medium Exudate Type: Serous Exudate Color: amber Slough/Fibrino Yes Wound Bed Kathryn [name], Kathryn [name] ([id number]) [id number] Page 15 of 18 Granulation Amount: None Present (0%) Exposed Structure Necrotic Amount: Large (67-100%) Fascia Exposed: No Fat Layer (Subcutaneous Tissue) Exposed: Yes Tendon Exposed: No Muscle Exposed: No Joint Exposed: No Bone Exposed: No Treatment Notes Wound #6 (Lower Leg) Wound Laterality: Right, Posterior, Proximal Cleanser Peri-Wound Care Topical Primary Dressing Secondary Dressing Secured With Compression Wrap Compression Stockings Add-Ons Electronic Signature(s) Signed: 03/08/2022 5:27:53 PM By: Kathryn Loud MSN RN CNS WTA Entered By: Kathryn [name] on 03/08/2022 11:11:51 -------------------------------------------------------------------------------- Wound Assessment Details Patient Name: Date of Service: Kathryn [name], [name] 03/08/2022 10:45 A M Medical Record Number: [id number] Patient Account Number: [id number] Date of Birth/Sex: Treating RN: 03-11-1934 (88 y.o. Kathryn [name] Primary Care Alistair Senft: Kathryn [name] Other Clinician: Referring Jawad Wiacek: Treating Lateka Rady/Extender: Kathryn [name] in Treatment: 4 Wound Status Wound Number: 7 Primary Diabetic Wound/Ulcer of the Lower  Extremity Etiology: Wound Location: Right T Second oe Wound Open Wounding Event: Gradually Appeared Status: Date Acquired: 01/15/2022 Comorbid Chronic Obstructive Pulmonary Disease (COPD), Congestive Heart Weeks Of Treatment: 4 History: Failure, Coronary Artery Disease, Hypertension, Peripheral Arterial Clustered Wound: No Disease, Peripheral Venous Disease, Type II Diabetes, Rheumatoid Arthritis, Neuropathy Photos Kathryn [name], Kathryn [name] ([id number]) [id number] Page 16 of 18 Wound Measurements Length: (cm) 0.4 Width: (cm) 0.6 Depth: (cm) 0.1 Area: (cm) 0.188 Volume: (cm) 0.019 % Reduction in Area: 4.1% % Reduction in Volume: 51.3% Wound Description Classification: Grade 1 Exudate Amount: Medium Exudate Type: Serous Exudate Color: amber Wound Bed Granulation Amount: None Present (0%) Exposed Structure Necrotic Amount: Large (67-100%) Fascia Exposed: No Necrotic Quality: Adherent Slough Fat Layer (Subcutaneous Tissue) Exposed: Yes Tendon Exposed: No Muscle Exposed: No Joint Exposed: No Bone Exposed: No Electronic Signature(s) Signed: 03/08/2022 5:27:53 PM By: Kathryn Loud MSN RN CNS WTA Entered By: Kathryn [name] on 03/08/2022 10:59:49 -------------------------------------------------------------------------------- Wound Assessment Details Patient Name: Date of Service: Kathryn [name], [name] 03/08/2022 10:45 A M Medical Record Number: [id number] Patient Account Number: [id number] Date of Birth/Sex: Treating RN: [birth year] (88 y.o. Kathryn [name] Primary Care Yidel Teuscher: Kathryn [name] Other Clinician: Referring Tristian Sickinger: Treating Shivangi Lutz/Extender: Kathryn [name] in Treatment: 4 Wound Status Wound Number: 8 Primary Abrasion Etiology: Wound Location: Left, Medial Lower Leg Wound Healed - Epithelialized Wounding Event: Laceration Status: Date Acquired: 02/04/2022 Comorbid Chronic Obstructive Pulmonary Disease (COPD),  Congestive Heart Weeks Of Treatment: 2 History: Failure, Coronary Artery Disease, Hypertension, Peripheral Arterial Clustered Wound: No Disease, Peripheral Venous Disease, Type II Diabetes, Rheumatoid Arthritis, Neuropathy Photos Kathryn [name], Kathryn [name] ([id number]) [id number] Page 17 of  18 Wound Measurements Length: (cm) Width: (cm) Depth: (cm) Area: (cm) Volume: (cm) 0 % Reduction in Area: 100% 0 % Reduction in Volume: 100% 0 Epithelialization: Small (1-33%) 0 0 Wound Description Classification: Partial Thickness Exudate Amount: Small Exudate Type: Serous Exudate Color: amber Foul Odor After Cleansing: No Slough/Fibrino No Wound Bed Granulation Amount: Small (1-33%) Exposed Structure Granulation Quality: Red Fascia Exposed: No Necrotic Amount: None Present (0%) Fat Layer (Subcutaneous Tissue) Exposed: Yes Tendon Exposed: No Muscle Exposed: No Joint Exposed: No Bone Exposed: No Treatment Notes Wound #8 (Lower Leg) Wound Laterality: Left, Medial Cleanser Peri-Wound Care Topical Primary Dressing Secondary Dressing Secured With Compression Wrap Compression Stockings Add-Ons Electronic Signature(s) Signed: 03/08/2022 5:27:53 PM By: Kathryn Loud MSN RN CNS WTA Entered By: Kathryn [name] on 03/08/2022 11:11:51 Vitals Details -------------------------------------------------------------------------------- Kathryn [name] ([id number]) [id number] Page 18 of 18 Patient Name: Date of Service: Kathryn [name], [place] [name] 03/08/2022 10:45 A M Medical Record Number: [id number] Patient Account Number: [id number] Date of Birth/Sex: Treating RN: Jun 18, 1933 (88 y.o. Kathryn [name] Primary Care Artyom Stencel: Kathryn [name] Other Clinician: Referring Sharnette Kitamura: Treating Buena Boehm/Extender: Kathryn [name] in Treatment: 4 Vital Signs Time Taken: 10:45 Temperature (F): 97.8 Height (in): 64 Pulse (bpm): 72 Weight (lbs):  178 Respiratory Rate (breaths/min): 18 Body Mass Index (BMI): 30.6 Blood Pressure (mmHg): 109/67 Reference Range: 80 - 120 mg / dl Electronic Signature(s) Signed: 03/08/2022 5:27:53 PM By: Kathryn Loud MSN RN CNS WTA Entered By: Kathryn [name] on 03/08/2022 10:47:07

## 2022-03-08 NOTE — Progress Notes (Addendum)
JALIANA, MEDELLIN ([id number]) [id number] Page 1 of 8 Visit Report for 03/08/2022 Chief Complaint Document Details Patient Name: Date of Service: Kathryn [name], Kathryn [name] Kathryn [name] 03/08/2022 10:45 A M Medical Record Number: [id number] Patient Account Number: [id number] Date of Birth/Sex: Treating RN: [birth year] (88 y.o. Kathryn [name] Primary Care Provider: Rory Percy Other Clinician: Referring Provider: Treating Provider/Extender: Lonell Face in Treatment: 4 Information Obtained from: Patient Chief Complaint Bilateral foot ulcers and right LE Ulcer Electronic Signature(s) Signed: 03/08/2022 10:35:00 AM By: Worthy Keeler PA-C Entered By: Worthy Keeler on 03/08/2022 10:35:00 -------------------------------------------------------------------------------- Debridement Details Patient Name: Date of Service: Kathryn [name], DEA Kathryn [name] 03/08/2022 10:45 A M Medical Record Number: [id number] Patient Account Number: [id number] Date of Birth/Sex: Treating RN: July 17, 1933 (88 y.o. Kathryn [name] Primary Care Provider: Rory Percy Other Clinician: Referring Provider: Treating Provider/Extender: Lonell Face in Treatment: 4 Debridement Performed for Assessment: Wound #7 Right T Second oe Performed By: Physician Tommie Sams., PA-C Debridement Type: Debridement Severity of Tissue Pre Debridement: Fat layer exposed Level of Consciousness (Pre-procedure): Awake and Alert Start Time: 11:11 Pain Control: Lidocaine 4% T opical Solution T Area Debrided ([name] x W): otal 0.4 (cm) x 0.6 (cm) = 0.24 (cm) Tissue and other material debrided: Slough, Subcutaneous, Slough Level: Skin/Subcutaneous Tissue Debridement Description: Excisional Instrument: Curette Bleeding: None Response to Treatment: Procedure was tolerated well Level of Consciousness (Post- Awake and Alert procedure): Post Debridement Measurements of Total Wound Arpino,  Kathryn [name] ([id number]) [id number] Page 2 of 8 Length: (cm) 0.4 Width: (cm) 0.6 Depth: (cm) 0.2 Volume: (cm) 0.038 Character of Wound/Ulcer Post Debridement: Stable Severity of Tissue Post Debridement: Fat layer exposed Post Procedure Diagnosis Same as Pre-procedure Electronic Signature(s) Signed: 03/08/2022 5:02:52 PM By: Worthy Keeler PA-C Signed: 03/08/2022 5:27:53 PM By: Rosalio Loud MSN RN CNS WTA Entered By: Rosalio Loud on 03/08/2022 11:32:26 -------------------------------------------------------------------------------- HPI Details Patient Name: Date of Service: Kathryn [name], DEA Kathryn [name] 03/08/2022 10:45 A M Medical Record Number: [id number] Patient Account Number: [id number] Date of Birth/Sex: Treating RN: [birth year] (88 y.o. Kathryn [name] Primary Care Provider: Rory Percy Other Clinician: Referring Provider: Treating Provider/Extender: Lonell Face in Treatment: 4 History of Present Illness HPI Description: 02-06-2022 upon evaluation today patient presents for initial inspection here in our clinic concerning wounds that she has over the bilateral feet as well as the right leg. She has been tolerating the dressing changes in general here without complication she has been putting antibiotic ointment on this. With that being said I do believe that she would benefit from the use of something like Xeroform gauze dressings which would likely be a bit better. Fortunately I do not see any signs of infection which is good news do not think she needs any antibiotics. She tells me that these wounds typically occur as a result of her bumping or hitting her leg and then they slowly progressed from there. Patient does have a history of diabetes for which her hemoglobin A1c was 6.9 measured on 12-12-2021. She also has a history of arterial insufficiency with a right ABI of 0.66 with a TBI of 0.61 and a left ABI of 0.82 with a TBI of 0.45.  She has a follow-up evaluation on 02-27-2022 with vascular. This testing was done on 01-28-2022. Subsequently the patient also has a history of hypertension, generalized weakness, congestive heart failure, and lower extremity edema. 02-13-2022 upon evaluation today patient appears to be doing well currently in  regard to her wounds. She is actually making some good progress here which is great news and overall I am extremely pleased with where we stand. I do not see any evidence of worsening. 02-22-2022 upon evaluation today patient's wounds actually are showing some signs of improvement for the most part. Fortunately there does not appear to be any evidence of infection locally nor systemically at this point which is great news. No fevers, chills, nausea, vomiting, or diarrhea. She does have an a vascular appointment on December 5 and we will see what they have to say at that point. 03-08-2022 upon evaluation today patient actually appears to be doing excellent in regards to her wounds. She has been tolerating the dressing changes without complication. Fortunately there is no sign of active infection locally nor systemically at this point which is great news. Electronic Signature(s) Signed: 03/08/2022 11:15:43 AM By: Worthy Keeler PA-C Entered By: Worthy Keeler on 03/08/2022 11:15:43 Kathryn [name] ([id number]) [id number] Page 3 of 8 -------------------------------------------------------------------------------- Physical Exam Details Patient Name: Date of Service: Kathryn All Kathryn [name] 03/08/2022 10:45 A M Medical Record Number: [id number] Patient Account Number: [id number] Date of Birth/Sex: Treating RN: [birth year] (88 y.o. Kathryn [name] Primary Care Provider: Rory Percy Other Clinician: Referring Provider: Treating Provider/Extender: Lonell Face in Treatment: 4 Constitutional Well-nourished and well-hydrated in no acute  distress. Respiratory normal breathing without difficulty. Psychiatric this patient is able to make decisions and demonstrates good insight into disease process. Alert and Oriented x 3. pleasant and cooperative. Notes Upon inspection patient's wound bed showed healing at most locations in fact the majority of the wounds we closed out today she is really just having areas in 2 spots on the toe that is the left fourth toe and the right second toe. Both of these however though do seem to be doing better than they were with less erythema noted as well which is also great news. Electronic Signature(s) Signed: 03/08/2022 11:16:11 AM By: Worthy Keeler PA-C Entered By: Worthy Keeler on 03/08/2022 11:16:11 -------------------------------------------------------------------------------- Physician Orders Details Patient Name: Date of Service: Kathryn [name], DEA Kathryn [name] 03/08/2022 10:45 A M Medical Record Number: [id number] Patient Account Number: [id number] Date of Birth/Sex: Treating RN: 10-20-1933 (88 y.o. Kathryn [name] Primary Care Provider: Rory Percy Other Clinician: Referring Provider: Treating Provider/Extender: Lonell Face in Treatment: 4 Verbal / Phone Orders: No Diagnosis Coding ICD-10 Coding Code Description E11.621 Type 2 diabetes mellitus with foot ulcer I73.89 Other specified peripheral vascular diseases L97.522 Non-pressure chronic ulcer of other part of left foot with fat layer exposed L97.512 Non-pressure chronic ulcer of other part of right foot with fat layer exposed L97.812 Non-pressure chronic ulcer of other part of right lower leg with fat layer exposed M62.81 Muscle weakness (generalized) Kathryn [name], Kathryn [name] ([id number]) [id number] Page 4 of 8 I50.42 Chronic combined systolic (congestive) and diastolic (congestive) heart failure I10 Essential (primary) hypertension Follow-up Appointments Return Appointment in 1  week. Bathing/ Shower/ Hygiene May shower; gently cleanse wound with antibacterial soap, rinse and pat dry prior to dressing wounds Anesthetic (Use 'Patient Medications' Section for Anesthetic Order Entry) Lidocaine applied to wound bed - In clinic only Edema Control - Lymphedema / Segmental Compressive Device / Other Elevate, Exercise Daily and A void Standing for Long Periods of Time. Elevate legs to the level of the heart and pump ankles as often as possible Elevate leg(s) parallel to the floor when sitting. DO YOUR BEST to sleep  in the bed at night. DO NOT sleep in your recliner. Long hours of sitting in a recliner leads to swelling of the legs and/or potential wounds on your backside. Wound Treatment Wound #1 - T Fourth oe Wound Laterality: Left Cleanser: Soap and Water 1 x Per Day/15 Days Discharge Instructions: Gently cleanse wound with antibacterial soap, rinse and pat dry prior to dressing wounds Prim Dressing: Xeroform-HBD 2x2 (in/in) (Generic) 1 x Per Day/15 Days ary Discharge Instructions: Apply Xeroform-HBD 2x2 (in/in) as directed Secondary Dressing: Coverlet Latex-Free Fabric Adhesive Dressings (Generic) 1 x Per Day/15 Days Discharge Instructions: 1.5 x 2 Secured With: Tubigrip Size D, 3x10 (in/yd) 1 x Per Day/15 Days Discharge Instructions: single Wound #7 - T Second oe Wound Laterality: Right Cleanser: Soap and Water 1 x Per Day/15 Days Discharge Instructions: Gently cleanse wound with antibacterial soap, rinse and pat dry prior to dressing wounds Prim Dressing: Xeroform-HBD 2x2 (in/in) (Generic) 1 x Per Day/15 Days ary Discharge Instructions: Apply Xeroform-HBD 2x2 (in/in) as directed Secondary Dressing: Coverlet Latex-Free Fabric Adhesive Dressings (Generic) 1 x Per Day/15 Days Discharge Instructions: 1.5 x 2 Secured With: Tubigrip Size D, 3x10 (in/yd) 1 x Per QQI/29 Days Discharge Instructions: single Electronic Signature(s) Signed: 03/08/2022 5:02:52 PM By:  Worthy Keeler PA-C Signed: 03/08/2022 5:27:53 PM By: Rosalio Loud MSN RN CNS WTA Entered By: Rosalio Loud on 03/08/2022 11:32:47 -------------------------------------------------------------------------------- Problem List Details Patient Name: Date of Service: Kathryn [name], DEA Kathryn [name] 03/08/2022 10:45 A M Medical Record Number: [id number] Patient Account Number: [id number] Date of Birth/Sex: Treating RN: [birth year] (88 y.o. Kathryn [name] Primary Care Provider: Rory Percy Other Clinician: Referring Provider: Treating Provider/Extender: Lonell Face in Treatment: 4 Kathryn [name], Kathryn [name] ([id number]) [id number] Page 5 of 8 Active Problems ICD-10 Encounter Code Description Active Date MDM Diagnosis E11.621 Type 2 diabetes mellitus with foot ulcer 02/06/2022 No Yes I73.89 Other specified peripheral vascular diseases 02/06/2022 No Yes L97.522 Non-pressure chronic ulcer of other part of left foot with fat layer exposed 02/06/2022 No Yes L97.512 Non-pressure chronic ulcer of other part of right foot with fat layer exposed 02/06/2022 No Yes L97.812 Non-pressure chronic ulcer of other part of right lower leg with fat layer 02/06/2022 No Yes exposed M62.81 Muscle weakness (generalized) 02/06/2022 No Yes I50.42 Chronic combined systolic (congestive) and diastolic (congestive) heart failure 02/06/2022 No Yes I10 Essential (primary) hypertension 02/06/2022 No Yes Inactive Problems Resolved Problems Electronic Signature(s) Signed: 03/08/2022 10:34:55 AM By: Worthy Keeler PA-C Entered By: Worthy Keeler on 03/08/2022 10:34:55 -------------------------------------------------------------------------------- Progress Note Details Patient Name: Date of Service: Kathryn [name], DEA Kathryn [name] 03/08/2022 10:45 A M Medical Record Number: [id number] Patient Account Number: [id number] Date of Birth/Sex: Treating RN: June 22, 1933 (88 y.o. Kathryn [name] Primary  Care Provider: Rory Percy Other Clinician: Referring Provider: Treating Provider/Extender: Lonell Face in Treatment: 4 Subjective Chief Complaint Information obtained from Patient Bilateral foot ulcers and right LE Ulcer Kathryn [name], Kathryn [name] ([id number]) [id number] Page 6 of 8 History of Present Illness (HPI) 02-06-2022 upon evaluation today patient presents for initial inspection here in our clinic concerning wounds that she has over the bilateral feet as well as the right leg. She has been tolerating the dressing changes in general here without complication she has been putting antibiotic ointment on this. With that being said I do believe that she would benefit from the use of something like Xeroform gauze dressings which would likely be a bit better. Fortunately I do not see any signs  of infection which is good news do not think she needs any antibiotics. She tells me that these wounds typically occur as a result of her bumping or hitting her leg and then they slowly progressed from there. Patient does have a history of diabetes for which her hemoglobin A1c was 6.9 measured on 12-12-2021. She also has a history of arterial insufficiency with a right ABI of 0.66 with a TBI of 0.61 and a left ABI of 0.82 with a TBI of 0.45. She has a follow-up evaluation on 02-27-2022 with vascular. This testing was done on 01-28-2022. Subsequently the patient also has a history of hypertension, generalized weakness, congestive heart failure, and lower extremity edema. 02-13-2022 upon evaluation today patient appears to be doing well currently in regard to her wounds. She is actually making some good progress here which is great news and overall I am extremely pleased with where we stand. I do not see any evidence of worsening. 02-22-2022 upon evaluation today patient's wounds actually are showing some signs of improvement for the most part. Fortunately there  does not appear to be any evidence of infection locally nor systemically at this point which is great news. No fevers, chills, nausea, vomiting, or diarrhea. She does have an a vascular appointment on December 5 and we will see what they have to say at that point. 03-08-2022 upon evaluation today patient actually appears to be doing excellent in regards to her wounds. She has been tolerating the dressing changes without complication. Fortunately there is no sign of active infection locally nor systemically at this point which is great news. Objective Constitutional Well-nourished and well-hydrated in no acute distress. Vitals Time Taken: 10:45 AM, Height: 64 in, Weight: 178 lbs, BMI: 30.6, Temperature: 97.8 F, Pulse: 72 bpm, Respiratory Rate: 18 breaths/min, Blood Pressure: 109/67 mmHg. Respiratory normal breathing without difficulty. Psychiatric this patient is able to make decisions and demonstrates good insight into disease process. Alert and Oriented x 3. pleasant and cooperative. General Notes: Upon inspection patient's wound bed showed healing at most locations in fact the majority of the wounds we closed out today she is really just having areas in 2 spots on the toe that is the left fourth toe and the right second toe. Both of these however though do seem to be doing better than they were with less erythema noted as well which is also great news. Integumentary (Hair, Skin) Wound #1 status is Open. Original cause of wound was Trauma. The date acquired was: 01/22/2022. The wound has been in treatment 4 weeks. The wound is located on the Left T Fourth. The wound measures 0.6cm length x 0.6cm width x 0.1cm depth; 0.283cm^2 area and 0.028cm^3 volume. There is Fat Layer oe (Subcutaneous Tissue) exposed. There is a medium amount of serous drainage noted. There is no granulation within the wound bed. There is a large (67-100%) amount of necrotic tissue within the wound bed including Adherent  Slough. Wound #2 status is Healed - Epithelialized. Original cause of wound was Gradually Appeared. The date acquired was: 01/22/2022. The wound has been in treatment 4 weeks. The wound is located on the Left,Lateral Foot. The wound measures 0cm length x 0cm width x 0cm depth; 0cm^2 area and 0cm^3 volume. There is Fat Layer (Subcutaneous Tissue) exposed. There is a medium amount of serous drainage noted. There is no granulation within the wound bed. There is a large (67-100%) amount of necrotic tissue within the wound bed. Wound #3 status is Healed - Epithelialized. Original  cause of wound was Gradually Appeared. The date acquired was: 01/22/2022. The wound has been in treatment 4 weeks. The wound is located on the Right,Lateral Lower Leg. The wound measures 0cm length x 0cm width x 0cm depth; 0cm^2 area and 0cm^3 volume. There is Fat Layer (Subcutaneous Tissue) exposed. There is a medium amount of serous drainage noted. There is no granulation within the wound bed. There is a large (67-100%) amount of necrotic tissue within the wound bed including Eschar. Wound #4 status is Healed - Epithelialized. Original cause of wound was Gradually Appeared. The date acquired was: 01/15/2022. The wound has been in treatment 4 weeks. The wound is located on the Right,Proximal Lower Leg. The wound measures 0cm length x 0cm width x 0cm depth; 0cm^2 area and 0cm^3 volume. There is Fat Layer (Subcutaneous Tissue) exposed. There is a medium amount of serous drainage noted. There is small (1-33%) granulation within the wound bed. There is a large (67-100%) amount of necrotic tissue within the wound bed. Wound #5 status is Healed - Epithelialized. Original cause of wound was Gradually Appeared. The date acquired was: 01/15/2022. The wound has been in treatment 4 weeks. The wound is located on the Right,Distal,Posterior Lower Leg. The wound measures 0cm length x 0cm width x 0cm depth; 0cm^2 area and 0cm^3 volume. There is  Fat Layer (Subcutaneous Tissue) exposed. There is a medium amount of serous drainage noted. There is no granulation within the wound bed. There is a large (67-100%) amount of necrotic tissue within the wound bed. Wound #6 status is Healed - Epithelialized. Original cause of wound was Gradually Appeared. The date acquired was: 12/18/2021. The wound has been in treatment 4 weeks. The wound is located on the Right,Proximal,Posterior Lower Leg. The wound measures 0cm length x 0cm width x 0cm depth; 0cm^2 area and 0cm^3 volume. There is Fat Layer (Subcutaneous Tissue) exposed. There is a medium amount of serous drainage noted. There is no granulation within the wound bed. There is a large (67-100%) amount of necrotic tissue within the wound bed. Wound #7 status is Open. Original cause of wound was Gradually Appeared. The date acquired was: 01/15/2022. The wound has been in treatment 4 weeks. The wound is located on the Right T Second. The wound measures 0.4cm length x 0.6cm width x 0.1cm depth; 0.188cm^2 area and 0.019cm^3 volume. There oe is Fat Layer (Subcutaneous Tissue) exposed. There is a medium amount of serous drainage noted. There is no granulation within the wound bed. There is a large (67-100%) amount of necrotic tissue within the wound bed including Adherent Slough. Wound #8 status is Healed - Epithelialized. Original cause of wound was Laceration. The date acquired was: 02/04/2022. The wound has been in treatment 2 weeks. The wound is located on the Left,Medial Lower Leg. The wound measures 0cm length x 0cm width x 0cm depth; 0cm^2 area and 0cm^3 volume. There is Fat Layer (Subcutaneous Tissue) exposed. There is a small amount of serous drainage noted. There is small (1-33%) red granulation within the wound bed. There is no necrotic tissue within the wound bed. 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 I would recommend that we have the patient going continue with the wound care measures as before and she is in agreement with plan recommend using the Xeroform gauze dressing which I think is still best. 2. I am also can recommend that we have the patient continue to elevate her legs is much as possible. Obviously the more that she can do the better. 3. She also has Velcro compression wraps which I think are doing well she will continue with those at this point as well vascular gave those to her. We will see patient back for reevaluation in 1 week here in the clinic. If anything worsens or changes patient will contact our office for additional recommendations. Electronic Signature(s) Signed: 03/08/2022 11:16:36 AM By: Worthy Keeler PA-C Entered By: Worthy Keeler on 03/08/2022 11:16:36 -------------------------------------------------------------------------------- SuperBill Details Patient Name: Date of Service: Kathryn [name], DEA Kathryn [name] 03/08/2022 Medical Record Number: [id number] Patient Account Number: [id number] Date of Birth/Sex: Treating RN: Nov 07, 1933 (88 y.o. Kathryn [name] Primary Care Provider: Rory Percy Other Clinician: Referring Provider: Treating Provider/Extender: Lonell Face in Treatment: 4 Diagnosis Coding ICD-10 Codes Code Description [phone] Type 2 diabetes mellitus with foot ulcer I73.89 Other specified peripheral vascular diseases L97.522 Non-pressure chronic ulcer of other part of left  foot with fat layer exposed L97.512 Non-pressure chronic ulcer of other part of right foot with fat layer exposed L97.812 Non-pressure chronic ulcer of other part of right lower leg with fat layer exposed M62.81 Muscle weakness (generalized) I50.42 Chronic combined systolic (congestive) and diastolic (congestive) heart failure I10 Essential (primary) hypertension Facility Procedures Kathryn [name], Kathryn [name] ([id number]): CPT4 Code Description [id number] [phone] - WOUND CARE VISIT-LEV 3 EST PT [id number] Page 8 of 8: Modifier Quantity 1 Physician Procedures : CPT4 Code Description Modifier [id number] - WC PHYS LEVEL 3 - EST PT ICD-10 Diagnosis Description E11.621 Type 2 diabetes mellitus with foot ulcer I73.89 Other specified peripheral vascular diseases L97.522 Non-pressure chronic ulcer of other part  of left foot with fat layer exposed L97.512 Non-pressure chronic ulcer of other part of right foot with fat layer exposed Quantity: 1 Electronic Signature(s) Signed: 03/08/2022 12:12:52 PM By: Rosalio Loud MSN RN CNS WTA Signed: 03/08/2022 5:02:52 PM By: Worthy Keeler PA-C Previous Signature: 03/08/2022 11:21:29 AM Version By: Worthy Keeler PA-C Entered By: Rosalio Loud on 03/08/2022 12:12:52

## 2022-03-12 ENCOUNTER — Other Ambulatory Visit: Payer: Self-pay

## 2022-03-12 ENCOUNTER — Telehealth: Payer: Self-pay | Admitting: Cardiovascular Disease

## 2022-03-12 MED ORDER — DAPAGLIFLOZIN PROPANEDIOL 10 MG PO TABS: 10.0000 mg | ORAL_TABLET | Freq: Every day | ORAL | 3 refills | Status: DC

## 2022-03-12 NOTE — Telephone Encounter (Signed)
Disp Refills Start End   dapagliflozin propanediol (FARXIGA) 10 MG TABS tablet 30 tablet 3 03/12/2022    Sig - Route: Take 1 tablet (10 mg total) by mouth daily. - Oral   Sent to pharmacy as: dapagliflozin propanediol (FARXIGA) 10 MG Tab tablet   E-Prescribing Status: Sent to pharmacy (03/12/2022 11:43 AM EST)    Pharmacy  [id number] - [place], [address] AT [address]

## 2022-03-12 NOTE — Telephone Encounter (Signed)
*  STAT* If patient is at the pharmacy, call can be transferred to refill team.   1. Which medications need to be refilled? (please list name of each medication and dose if known)   FARXIGA 10 MG TABS tablet   2. Which pharmacy/location (including street and city if local pharmacy) is medication to be sent to?  [facility] [place], [address] AT [facility] OF [address]   3. Do they need a 30 day or 90 day supply?  30 day   Patient stated she is completely out of this medication.

## 2022-03-13 ENCOUNTER — Other Ambulatory Visit: Payer: Self-pay

## 2022-03-13 ENCOUNTER — Emergency Department: Payer: Medicare Other

## 2022-03-13 ENCOUNTER — Encounter: Payer: Self-pay | Admitting: Emergency Medicine

## 2022-03-13 ENCOUNTER — Inpatient Hospital Stay
Admission: EM | Admit: 2022-03-13 | Discharge: 2022-03-16 | DRG: 291 | Disposition: A | Discharge status: Home Health | Payer: Medicare Other | Attending: Internal Medicine | Admitting: Internal Medicine

## 2022-03-13 DIAGNOSIS — E669 Obesity, unspecified: Secondary | ICD-10-CM | POA: Diagnosis present

## 2022-03-13 DIAGNOSIS — J189 Pneumonia, unspecified organism: Secondary | ICD-10-CM

## 2022-03-13 DIAGNOSIS — I10 Essential (primary) hypertension: Secondary | ICD-10-CM | POA: Diagnosis present

## 2022-03-13 DIAGNOSIS — N179 Acute kidney failure, unspecified: Secondary | ICD-10-CM | POA: Diagnosis present

## 2022-03-13 DIAGNOSIS — R069 Unspecified abnormalities of breathing: Secondary | ICD-10-CM | POA: Diagnosis not present

## 2022-03-13 DIAGNOSIS — N1831 Chronic kidney disease, stage 3a: Secondary | ICD-10-CM | POA: Diagnosis not present

## 2022-03-13 DIAGNOSIS — I130 Hypertensive heart and chronic kidney disease with heart failure and stage 1 through stage 4 chronic kidney disease, or unspecified chronic kidney disease: Principal | ICD-10-CM | POA: Diagnosis present

## 2022-03-13 DIAGNOSIS — Z888 Allergy status to other drugs, medicaments and biological substances status: Secondary | ICD-10-CM

## 2022-03-13 DIAGNOSIS — J9601 Acute respiratory failure with hypoxia: Secondary | ICD-10-CM | POA: Diagnosis present

## 2022-03-13 DIAGNOSIS — I2510 Atherosclerotic heart disease of native coronary artery without angina pectoris: Secondary | ICD-10-CM | POA: Diagnosis not present

## 2022-03-13 DIAGNOSIS — Z87891 Personal history of nicotine dependence: Secondary | ICD-10-CM | POA: Diagnosis not present

## 2022-03-13 DIAGNOSIS — E785 Hyperlipidemia, unspecified: Secondary | ICD-10-CM | POA: Diagnosis present

## 2022-03-13 DIAGNOSIS — Z85828 Personal history of other malignant neoplasm of skin: Secondary | ICD-10-CM | POA: Diagnosis not present

## 2022-03-13 DIAGNOSIS — Z7984 Long term (current) use of oral hypoglycemic drugs: Secondary | ICD-10-CM

## 2022-03-13 DIAGNOSIS — Z7982 Long term (current) use of aspirin: Secondary | ICD-10-CM | POA: Diagnosis not present

## 2022-03-13 DIAGNOSIS — Z803 Family history of malignant neoplasm of breast: Secondary | ICD-10-CM | POA: Diagnosis not present

## 2022-03-13 DIAGNOSIS — Z8249 Family history of ischemic heart disease and other diseases of the circulatory system: Secondary | ICD-10-CM | POA: Diagnosis not present

## 2022-03-13 DIAGNOSIS — R062 Wheezing: Secondary | ICD-10-CM | POA: Diagnosis not present

## 2022-03-13 DIAGNOSIS — I429 Cardiomyopathy, unspecified: Secondary | ICD-10-CM | POA: Diagnosis present

## 2022-03-13 DIAGNOSIS — Z801 Family history of malignant neoplasm of trachea, bronchus and lung: Secondary | ICD-10-CM

## 2022-03-13 DIAGNOSIS — Z1152 Encounter for screening for COVID-19: Secondary | ICD-10-CM

## 2022-03-13 DIAGNOSIS — E1122 Type 2 diabetes mellitus with diabetic chronic kidney disease: Secondary | ICD-10-CM | POA: Diagnosis present

## 2022-03-13 DIAGNOSIS — E039 Hypothyroidism, unspecified: Secondary | ICD-10-CM | POA: Diagnosis not present

## 2022-03-13 DIAGNOSIS — Z79899 Other long term (current) drug therapy: Secondary | ICD-10-CM

## 2022-03-13 DIAGNOSIS — R0902 Hypoxemia: Secondary | ICD-10-CM

## 2022-03-13 DIAGNOSIS — R7881 Bacteremia: Secondary | ICD-10-CM | POA: Diagnosis present

## 2022-03-13 DIAGNOSIS — I4719 Other supraventricular tachycardia: Secondary | ICD-10-CM | POA: Diagnosis present

## 2022-03-13 DIAGNOSIS — J449 Chronic obstructive pulmonary disease, unspecified: Secondary | ICD-10-CM | POA: Diagnosis not present

## 2022-03-13 DIAGNOSIS — I5043 Acute on chronic combined systolic (congestive) and diastolic (congestive) heart failure: Secondary | ICD-10-CM | POA: Diagnosis present

## 2022-03-13 DIAGNOSIS — Z79890 Hormone replacement therapy (postmenopausal): Secondary | ICD-10-CM

## 2022-03-13 DIAGNOSIS — Z66 Do not resuscitate: Secondary | ICD-10-CM | POA: Diagnosis present

## 2022-03-13 DIAGNOSIS — J441 Chronic obstructive pulmonary disease with (acute) exacerbation: Secondary | ICD-10-CM | POA: Diagnosis present

## 2022-03-13 DIAGNOSIS — I471 Supraventricular tachycardia, unspecified: Secondary | ICD-10-CM | POA: Diagnosis present

## 2022-03-13 DIAGNOSIS — K219 Gastro-esophageal reflux disease without esophagitis: Secondary | ICD-10-CM | POA: Diagnosis present

## 2022-03-13 DIAGNOSIS — Z6831 Body mass index (BMI) 31.0-31.9, adult: Secondary | ICD-10-CM

## 2022-03-13 DIAGNOSIS — E1151 Type 2 diabetes mellitus with diabetic peripheral angiopathy without gangrene: Secondary | ICD-10-CM | POA: Diagnosis present

## 2022-03-13 DIAGNOSIS — R0602 Shortness of breath: Secondary | ICD-10-CM | POA: Diagnosis not present

## 2022-03-13 DIAGNOSIS — E119 Type 2 diabetes mellitus without complications: Secondary | ICD-10-CM

## 2022-03-13 LAB — COMPREHENSIVE METABOLIC PANEL
ALT: 12 U/L (ref 0–44)
AST: 19 U/L (ref 15–41)
Albumin: 3.3 g/dL — ABNORMAL LOW (ref 3.5–5.0)
Alkaline Phosphatase: 84 U/L (ref 38–126)
Anion gap: 4 — ABNORMAL LOW (ref 5–15)
BUN: 30 mg/dL — ABNORMAL HIGH (ref 8–23)
CO2: 27 mmol/L (ref 22–32)
Calcium: 8.7 mg/dL — ABNORMAL LOW (ref 8.9–10.3)
Chloride: 113 mmol/L — ABNORMAL HIGH (ref 98–111)
Creatinine, Ser: 1.53 mg/dL — ABNORMAL HIGH (ref 0.44–1.00)
GFR, Estimated: 33 mL/min — ABNORMAL LOW (ref >60)
Glucose, Bld: 194 mg/dL — ABNORMAL HIGH (ref 70–99)
Potassium: 4.4 mmol/L (ref 3.5–5.1)
Sodium: 144 mmol/L (ref 135–145)
Total Bilirubin: 0.5 mg/dL (ref 0.3–1.2)
Total Protein: 6.8 g/dL (ref 6.5–8.1)

## 2022-03-13 LAB — CBG MONITORING, ED
Glucose-Capillary: 113 mg/dL — ABNORMAL HIGH (ref 70–99)
Glucose-Capillary: 140 mg/dL — ABNORMAL HIGH (ref 70–99)
Glucose-Capillary: 162 mg/dL — ABNORMAL HIGH (ref 70–99)

## 2022-03-13 LAB — RESP PANEL BY RT-PCR (RSV, FLU A&B, COVID)  RVPGX2
Influenza A by PCR: NEGATIVE
Influenza B by PCR: NEGATIVE
Resp Syncytial Virus by PCR: NEGATIVE
SARS Coronavirus 2 by RT PCR: NEGATIVE

## 2022-03-13 LAB — CBC
HCT: 32.2 % — ABNORMAL LOW (ref 36.0–46.0)
Hemoglobin: 8.7 g/dL — ABNORMAL LOW (ref 12.0–15.0)
MCH: 22.4 pg — ABNORMAL LOW (ref 26.0–34.0)
MCHC: 27 g/dL — ABNORMAL LOW (ref 30.0–36.0)
MCV: 82.8 fL (ref 80.0–100.0)
Platelets: 307 10*3/uL (ref 150–400)
RBC: 3.89 MIL/uL (ref 3.87–5.11)
RDW: 18.5 % — ABNORMAL HIGH (ref 11.5–15.5)
WBC: 9 10*3/uL (ref 4.0–10.5)
nRBC: 0 % (ref 0.0–0.2)

## 2022-03-13 LAB — TROPONIN I (HIGH SENSITIVITY)
Troponin I (High Sensitivity): 9 ng/L (ref <18)
Troponin I (High Sensitivity): 11 ng/L (ref <18)

## 2022-03-13 LAB — BRAIN NATRIURETIC PEPTIDE: B Natriuretic Peptide: 478.7 pg/mL — ABNORMAL HIGH (ref 0.0–100.0)

## 2022-03-13 MED ORDER — SODIUM CHLORIDE 0.9 % IV SOLN
1.0000 g | Freq: Once | INTRAVENOUS | Status: AC
  Administered 2022-03-13: 1 g via INTRAVENOUS
  Filled 2022-03-13: qty 10

## 2022-03-13 MED ORDER — SODIUM CHLORIDE 0.9 % IV SOLN
500.0000 mg | INTRAVENOUS | Status: DC
  Administered 2022-03-14 – 2022-03-16 (×3): 500 mg via INTRAVENOUS
  Filled 2022-03-13: qty 500
  Filled 2022-03-13 (×2): qty 5

## 2022-03-13 MED ORDER — FUROSEMIDE 10 MG/ML IJ SOLN
40.0000 mg | Freq: Two times a day (BID) | INTRAMUSCULAR | Status: DC
  Administered 2022-03-13 – 2022-03-15 (×5): 40 mg via INTRAVENOUS
  Filled 2022-03-13 (×5): qty 4

## 2022-03-13 MED ORDER — INSULIN ASPART 100 UNIT/ML IJ SOLN
0.0000 [IU] | Freq: Three times a day (TID) | INTRAMUSCULAR | Status: DC
  Administered 2022-03-13: 1 [IU] via SUBCUTANEOUS
  Administered 2022-03-14: 2 [IU] via SUBCUTANEOUS
  Administered 2022-03-14: 1 [IU] via SUBCUTANEOUS
  Administered 2022-03-15: 2 [IU] via SUBCUTANEOUS
  Administered 2022-03-15: 1 [IU] via SUBCUTANEOUS
  Administered 2022-03-15: 3 [IU] via SUBCUTANEOUS
  Administered 2022-03-16: 1 [IU] via SUBCUTANEOUS
  Filled 2022-03-13 (×8): qty 1

## 2022-03-13 MED ORDER — GABAPENTIN 400 MG PO CAPS
400.0000 mg | ORAL_CAPSULE | Freq: Two times a day (BID) | ORAL | Status: DC
  Administered 2022-03-13 – 2022-03-16 (×6): 400 mg via ORAL
  Filled 2022-03-13 (×6): qty 1

## 2022-03-13 MED ORDER — IPRATROPIUM-ALBUTEROL 0.5-2.5 (3) MG/3ML IN SOLN
3.0000 mL | Freq: Once | RESPIRATORY_TRACT | Status: AC
  Administered 2022-03-13: 3 mL via RESPIRATORY_TRACT

## 2022-03-13 MED ORDER — DM-GUAIFENESIN ER 30-600 MG PO TB12: 1.0000 | ORAL_TABLET | Freq: Two times a day (BID) | ORAL | Status: DC | PRN

## 2022-03-13 MED ORDER — ENOXAPARIN SODIUM 30 MG/0.3ML IJ SOSY
30.0000 mg | PREFILLED_SYRINGE | INTRAMUSCULAR | Status: DC
  Administered 2022-03-13 – 2022-03-14 (×2): 30 mg via SUBCUTANEOUS
  Filled 2022-03-13 (×2): qty 0.3

## 2022-03-13 MED ORDER — SODIUM CHLORIDE 0.9 % IV SOLN
500.0000 mg | Freq: Once | INTRAVENOUS | Status: AC
  Administered 2022-03-13: 500 mg via INTRAVENOUS
  Filled 2022-03-13: qty 5

## 2022-03-13 MED ORDER — IPRATROPIUM-ALBUTEROL 0.5-2.5 (3) MG/3ML IN SOLN
3.0000 mL | RESPIRATORY_TRACT | Status: DC
  Administered 2022-03-13: 3 mL via RESPIRATORY_TRACT
  Filled 2022-03-13: qty 3

## 2022-03-13 MED ORDER — HYDROXYZINE HCL 10 MG PO TABS
10.0000 mg | ORAL_TABLET | Freq: Four times a day (QID) | ORAL | Status: DC | PRN
  Administered 2022-03-15: 10 mg via ORAL
  Filled 2022-03-13 (×3): qty 1

## 2022-03-13 MED ORDER — CARVEDILOL 6.25 MG PO TABS
6.2500 mg | ORAL_TABLET | Freq: Two times a day (BID) | ORAL | Status: DC
  Administered 2022-03-13 – 2022-03-16 (×6): 6.25 mg via ORAL
  Filled 2022-03-13 (×6): qty 1

## 2022-03-13 MED ORDER — ATORVASTATIN CALCIUM 20 MG PO TABS
40.0000 mg | ORAL_TABLET | Freq: Every day | ORAL | Status: DC
  Administered 2022-03-13 – 2022-03-16 (×4): 40 mg via ORAL
  Filled 2022-03-13 (×4): qty 2

## 2022-03-13 MED ORDER — LEVOTHYROXINE SODIUM 100 MCG PO TABS
100.0000 ug | ORAL_TABLET | Freq: Every day | ORAL | Status: DC
  Administered 2022-03-13 – 2022-03-16 (×4): 100 ug via ORAL
  Filled 2022-03-13: qty 1
  Filled 2022-03-13: qty 2
  Filled 2022-03-13 (×2): qty 1

## 2022-03-13 MED ORDER — ACETAMINOPHEN 325 MG PO TABS
650.0000 mg | ORAL_TABLET | Freq: Four times a day (QID) | ORAL | Status: DC | PRN
  Administered 2022-03-13 – 2022-03-15 (×5): 650 mg via ORAL
  Filled 2022-03-13 (×5): qty 2

## 2022-03-13 MED ORDER — ASPIRIN 81 MG PO TBEC
81.0000 mg | DELAYED_RELEASE_TABLET | Freq: Every day | ORAL | Status: DC
  Administered 2022-03-13 – 2022-03-16 (×4): 81 mg via ORAL
  Filled 2022-03-13 (×4): qty 1

## 2022-03-13 MED ORDER — IPRATROPIUM-ALBUTEROL 0.5-2.5 (3) MG/3ML IN SOLN
3.0000 mL | Freq: Three times a day (TID) | RESPIRATORY_TRACT | Status: DC
  Administered 2022-03-13 – 2022-03-14 (×3): 3 mL via RESPIRATORY_TRACT
  Filled 2022-03-13 (×3): qty 3

## 2022-03-13 MED ORDER — GABAPENTIN 300 MG PO CAPS
400.0000 mg | ORAL_CAPSULE | Freq: Four times a day (QID) | ORAL | Status: DC
  Administered 2022-03-13: 400 mg via ORAL
  Filled 2022-03-13: qty 1

## 2022-03-13 MED ORDER — HYDRALAZINE HCL 20 MG/ML IJ SOLN: 5.0000 mg | INTRAMUSCULAR | Status: DC | PRN

## 2022-03-13 MED ORDER — AMIODARONE HCL 200 MG PO TABS
100.0000 mg | ORAL_TABLET | Freq: Every day | ORAL | Status: DC
  Administered 2022-03-13 – 2022-03-16 (×4): 100 mg via ORAL
  Filled 2022-03-13 (×4): qty 1

## 2022-03-13 MED ORDER — INSULIN ASPART 100 UNIT/ML IJ SOLN: 0.0000 [IU] | Freq: Every day | INTRAMUSCULAR | Status: DC

## 2022-03-13 MED ORDER — SODIUM CHLORIDE 0.9 % IV SOLN
1.0000 g | INTRAVENOUS | Status: DC
  Administered 2022-03-14: 1 g via INTRAVENOUS
  Filled 2022-03-13: qty 10

## 2022-03-13 MED ORDER — ALBUTEROL SULFATE (2.5 MG/3ML) 0.083% IN NEBU: 2.5000 mg | INHALATION_SOLUTION | RESPIRATORY_TRACT | Status: DC | PRN

## 2022-03-13 NOTE — ED Provider Notes (Signed)
   [facility] Provider Note    Event Date/Time   First MD Initiated Contact with Patient 03/13/22 [phone]     (approximate)  History   Chief Complaint: Shortness of Breath and Respiratory Distress  HPI  Kathryn [name] is a 88 y.o. female with a past medical history of CHF, COPD, CAD, diabetes, hypertension, hyperlipidemia, presents to the emergency department for shortness of breath.  According to EMS they were called out to the patient's residence for shortness of breath found the patient to be satting 76% on room air with no baseline O2 requirement.  They placed the patient on CPAP and brought to the emergency department.  Patient received 1 albuterol nebulizer and part of a DuoNeb and route to the hospital.  Here patient continues to state shortness of breath does have a slight expiratory wheeze bilaterally.  No significant lower extremity edema.  Denies any chest pain.  Physical Exam   General: Awake, no significant distress although currently sitting upright in bed on CPAP. CV:  Good peripheral perfusion.  Regular rate and rhythm  Resp:  Mild tachypnea mild expiratory wheeze bilaterally.  Diminished breath sounds overall. Abd:  No distention.  Soft, nontender.  No rebound or guarding. Other:  Minimal lower extremity edema.   ED Results / Procedures / Treatments   EKG  EKG viewed and interpreted by myself shows a normal sinus rhythm 82 bpm with a widened QRS, normal axis, slight QTc prolongation otherwise normal intervals nonspecific ST changes.  Most consistent left lateral branch block.  RADIOLOGY  I have reviewed and interpreted the chest x-ray images.  Patient appears to have small consolidations bilaterally. Radiology has read the x-ray is consistent with chronic lung disease with possible acute infectious exacerbation.   MEDICATIONS ORDERED IN ED: Medications  ipratropium-albuterol (DUONEB) 0.5-2.5 (3) MG/3ML nebulizer solution 3 mL (has no  administration in time range)  ipratropium-albuterol (DUONEB) 0.5-2.5 (3) MG/3ML nebulizer solution 3 mL (has no administration in time range)     IMPRESSION / MDM / ASSESSMENT AND PLAN / ED COURSE  I reviewed the triage vital signs and the nursing notes.  Patient's presentation is most consistent with acute presentation with potential threat to life or bodily function.  Patient presents to the emergency department for shortness of breath.  Patient states shortness of breath just started this evening.  EMS states room air saturation of 76% with no baseline O2 requirement.  We will transition the patient from CPAP to BiPAP.  We will check labs including cardiac enzymes and the BNP we will obtain a chest x-ray EKG and continue to closely monitor.  We will dose to DuoNebs while awaiting results.  Chest x-ray appears most consistent with chronic lung disease with an acute infectious process.  Patient's lab work has resulted showing a normal white blood cell count patient is anemic although not significantly changed from baseline.  Troponin is 9 chemistry shows no concerning findings.  Given the patient's hypoxia will with a chest x-ray consistent with acute infectious process we will start the patient on IV antibiotics.  Will admit to the hospital service for ongoing workup and management.  FINAL CLINICAL IMPRESSION(S) / ED DIAGNOSES   Dyspnea COPD exacerbation Hypoxia Pneumonia   Note:  This document was prepared using Dragon voice recognition software and may include unintentional dictation errors.   Harvest Dark, MD 03/13/22 [phone]

## 2022-03-13 NOTE — ED Triage Notes (Signed)
Pt arrived via [facility] from home in respiratory distress. Initial room air saturation for first responders reported in 70's. Pt placed on CPAP with 1 duo neb and 1 albuterol treatment by EMS in route. Pt arrived on CPAP at 94%. Pt alert and talking on arrival, answering all questions appropriately.  MD at bedside as well as respiratory to initiate BIPAP.

## 2022-03-13 NOTE — H&P (Signed)
History and Physical    Kathryn [name] YSA:[id number] DOB: Aug 12, 1933 DOA: 03/13/2022  Referring MD/NP/PA:   PCP: Myles Gip, DO   Patient coming from:  The patient is coming from home.    Chief Complaint: SOB  HPI: Kathryn [name] is a 88 y.o. female with medical history significant of hypertension, hyperlipidemia, diabetes mellitus, COPD not using oxygen at baseline, GERD, hypothyroidism, skin cancer, PVD, left bundle blockade, gastric ulcer, CHF with EF of 40-45%, former smoker, who presents with shortness of breath.   Patient states that her shortness of breath has been progressively worsening for more than 1 week.  Patient has dry cough, no chest pain.  She has wheezing.  No fever or chills.  Patient does not have nausea, vomiting, diarrhea or abdominal pain.  No symptoms of UTI.  Patient is not using oxygen normally at home.  She was found to have severe respiratory distress, oxygen desaturation to 70s per report, using accessory muscle for breathing, cannot speak in full sentence.  BiPAP was started in ED  Data reviewed independently and ED Course: pt was found to have BNP 478, troponin level 9, negative COVID PCR and flu PCR, WBC 9.0, worsening renal function, temperature normal, blood pressure 120/73, heart rate 78, RR 29.  Patient is admitted to PCU as inpatient.  CXR: 1. Chronic lung disease with mildly increased bibasilar reticulonodular opacity suspicious for acute infectious exacerbation in this setting. 2. Aortic Atherosclerosis (ICD10-I70.0).  EKG: I have personally reviewed.  Sinus rhythm, QTc 518, left bundle blockade, poor R wave progression, low voltage   Review of Systems:   General: no fevers, chills, no body weight gain, has fatigue HEENT: no blurry vision, hearing changes or sore throat Respiratory: has dyspnea, coughing, wheezing CV: no chest pain, no palpitations GI: no nausea, vomiting, abdominal pain, diarrhea, constipation GU: no dysuria,  burning on urination, increased urinary frequency, hematuria  Ext: has leg edema Neuro: no unilateral weakness, numbness, or tingling, no vision change or hearing loss Skin: no rash, no skin tear. MSK: No muscle spasm, no deformity, no limitation of range of movement in spin Heme: No easy bruising.  Travel history: No recent long distant travel.   Allergy:  Allergies  Allergen Reactions   Oxycodone Other (See Comments)    Mental status change   Prednisone     swelling, bruising.    Past Medical History:  Diagnosis Date   Actinic keratosis    Aortic atherosclerosis ([place]) 05/25/2020   Arthritis    spinal stenosis   Cardiomyopathy - presumed to be nonischemic    a. 03/2020 Echo: EF 40-45%, gr2 DD. Nl RV fxn. Mildly dil LA. Mild MR/ao sclerosis; b. 03/2020 MV: EF 30-44%, no ischemia. Cor Ca2+ in pLAD.   CHF (congestive heart failure) (HCC)    COPD (chronic obstructive pulmonary disease) (HCC)    Coronary artery calcification seen on CT scan    a. 03/2020 MV: CT attenuation images show Ao and mod pLAD Ca2+.   Diabetes mellitus without complication ([place])    Gastric ulcer 06/2010   treated with Prolisec- states no problems now   Hyperlipidemia    Hypertension    PCP Dr Miguel Aschoff   [place]   Hypothyroidism    Left bundle branch block (LBBB)    Neuromuscular disorder (HCC)    slight numbness right toes- comes and goes   Peripheral vascular disease (HCC)    varicose veins left leg   Pneumonia    PSVT (paroxysmal  supraventricular tachycardia)    a. 03/2020 Zio: Avg HR 87 (66-211).  4 beats NSVT.  43 PSVT episodes, longest 10h 36m@ avg of 153 bpm (max 211).  Seen by EP-->amio started (pt wished to avoid EPS/RFCA).   Seasonal allergies    Shortness of breath    Skin cancer    multiple from face   Squamous cell carcinoma of skin 03/23/2013   L dorsal hand   Squamous cell carcinoma of skin 02/04/2014   R forearm/in situ, L pretibial   Squamous cell carcinoma of skin  09/24/2018   R thumb   Squamous cell carcinoma of skin 09/08/2019   L forearm - ED&C   Squamous cell carcinoma of skin 09/08/2019   R dosal hand - ED&C   Squamous cell carcinoma of skin 06/20/2021   R cheek preauricular, excised    Past Surgical History:  Procedure Laterality Date   BACK SURGERY     4/12  Dr AShellia Carwin for lumbar stenosis   BREAST CYST ASPIRATION Left 1965   negative   [place]  lumpectomy with biopsy   left   CARPAL TUNNEL RELEASE     right   COLONOSCOPY     COLONOSCOPY WITH PROPOFOL N/A 06/30/2015   Procedure: COLONOSCOPY WITH PROPOFOL;  Surgeon: PHulen Luster MD;  [facility];  Service: Gastroenterology;  Laterality: N/A;   COLONOSCOPY WITH PROPOFOL N/A 01/22/2017   Procedure: COLONOSCOPY WITH PROPOFOL;  Surgeon: SLollie Sails MD;  [facility];  Service: Endoscopy;  Laterality: N/A;   COLONOSCOPY WITH PROPOFOL N/A 01/28/2019   Procedure: COLONOSCOPY WITH PROPOFOL;  Surgeon: Toledo, TBenay Pike MD;  Location: [facility] ENDOSCOPY;  Service: Gastroenterology;  Laterality: N/A;   ESOPHAGOGASTRODUODENOSCOPY     ESOPHAGOGASTRODUODENOSCOPY (EGD) WITH PROPOFOL N/A 01/28/2019   Procedure: ESOPHAGOGASTRODUODENOSCOPY (EGD) WITH PROPOFOL;  Surgeon: Toledo, TBenay Pike MD;  Location: [facility] ENDOSCOPY;  Service: Gastroenterology;  Laterality: N/A;   EYE SURGERY     cataract extraction with IOL bilaterally   JOINT REPLACEMENT     LUMBAR LAMINECTOMY/DECOMPRESSION MICRODISCECTOMY  05/24/2011   Procedure: LUMBAR LAMINECTOMY/DECOMPRESSION MICRODISCECTOMY 2 LEVELS;  Surgeon: JMagnus Sinning MD;  Location: [facility];  Service: Orthopedics;  Laterality: N/A;  L2-L3, L3-L4 (x-ray)   RIGHT OOPHORECTOMY     due to STAPH INFECTION    Social History:  reports that she quit smoking about 17 years ago. Her smoking use included cigarettes. She has a 25.00 pack-year smoking history. She has never used smokeless tobacco. She reports current alcohol use. She  reports that she does not use drugs.  Family History:  Family History  Problem Relation Age of Onset   Dementia Mother    Lung cancer Father    Heart disease Father    Hypertension Sister    Breast cancer Maternal Aunt    Arthritis Son      Prior to Admission medications   Medication Sig Start Date End Date Taking? Authorizing Provider  acetaminophen (TYLENOL) 500 MG tablet Take 1,000 mg by mouth every 6 (six) hours as needed. Pain     [provider]  albuterol (PROVENTIL) (2.5 MG/3ML) 0.083% nebulizer solution USE 1 VIAL VIA NEBULIZER EVERY 6 HOURS AS NEEDED FOR WHEEZING OR SHORTNESS OF BREATH 10/23/21   GJerrol Banana, MD  amiodarone (PACERONE) 200 MG tablet Take 0.5 tablets (100 mg total) by mouth daily. 12/27/21   LVickie Epley MD  aspirin 81 MG EC tablet Take 81 mg by mouth  daily. Swallow whole.    [provider]  atorvastatin (LIPITOR) 40 MG tablet TAKE 1 TABLET(40 MG) BY MOUTH DAILY 01/29/22   Minna Merritts, MD  carvedilol (COREG) 6.25 MG tablet TAKE 1 TABLET(6.25 MG) BY MOUTH TWICE DAILY WITH A MEAL 01/29/22   Gollan, Kathlene November, MD  dapagliflozin propanediol (FARXIGA) 10 MG TABS tablet Take 1 tablet (10 mg total) by mouth daily. 03/12/22   Minna Merritts, MD  glucose blood (CONTOUR NEXT TEST) test strip Check sugar once daily  DX E11.9 11/16/16   Jerrol Banana., MD  hydrOXYzine (ATARAX) 10 MG tablet TAKE 1 TO 2 TABLETS(10 TO 20 MG) BY MOUTH EVERY 6 HOURS AS NEEDED 03/07/22   Myles Gip, DO  levothyroxine (SYNTHROID) 100 MCG tablet TAKE 1 TABLET(100 MCG) BY MOUTH DAILY BEFORE BREAKFAST 02/26/22   Myles Gip, DO  lidocaine (LIDODERM) 5 % 1 patch daily. 12/19/21   [provider]  losartan (COZAAR) 25 MG tablet Take 1 tablet (25 mg total) by mouth every evening. 07/17/21   Alisa Graff, FNP  metFORMIN (GLUCOPHAGE) 1000 MG tablet TAKE 1 TABLET(1000 MG) BY MOUTH DAILY WITH SUPPER 02/05/22   Myles Gip, DO   omeprazole (PRILOSEC) 20 MG capsule TAKE 1 CAPSULE(20 MG) BY MOUTH DAILY 01/01/22   Ostwalt, Letitia Libra, PA-C  pregabalin (LYRICA) 75 MG capsule Take 1 capsule (75 mg total) by mouth 2 (two) times daily. 12/12/21   Jerrol Banana., MD  SPIRIVA HANDIHALER 18 MCG inhalation capsule PLACE 1 CAPSULE INTO INHALER AND INHALE EVERY DAY AS NEEDED 05/25/21   Jerrol Banana., MD  Torsemide 40 MG TABS Take 40 mg by mouth daily. May take extra 20 mg (1/2 tab) after lunch as needed for swelling 02/26/22   Minna Merritts, MD    Physical Exam: Vitals:   03/13/22 0700 03/13/22 0800 03/13/22 0830 03/13/22 0900  BP: 120/73 137/63 (!) 137/57 (!) 147/65  Pulse: 76 68 66   Resp: '19 19 12 14  '$ Temp:      TempSrc:      SpO2: 98% 100% 100%   Weight:      Height:       General: in acute respiratory distress HEENT:       Eyes: PERRL, EOMI, no scleral icterus.       ENT: No discharge from the ears and nose, no pharynx injection, no tonsillar enlargement.        Neck: positive JVD, no bruit, no mass felt. Heme: No neck lymph node enlargement. Cardiac: S1/S2, RRR, No murmurs, No gallops or rubs. Respiratory: Has mild wheezing bilaterally GI: Soft, nondistended, nontender, no rebound pain, no organomegaly, BS present. GU: No hematuria Ext: 2+ pitting leg edema bilaterally. 1+DP/PT pulse bilaterally. Musculoskeletal: No joint deformities, No joint redness or warmth, no limitation of ROM in spin. Skin: No rashes.  Neuro: Alert, oriented X3, cranial nerves II-XII grossly intact, moves all extremities normally.  Psych: Patient is not psychotic, no suicidal or hemocidal ideation.  Labs on Admission: I have personally reviewed following labs and imaging studies  CBC: Recent Labs  Lab 03/13/22 0448  WBC 9.0  HGB 8.7*  HCT 32.2*  MCV 82.8  PLT 465   Basic Metabolic Panel: Recent Labs  Lab 03/13/22 0448  NA 144  K 4.4  CL 113*  CO2 27  GLUCOSE 194*  BUN 30*  CREATININE 1.53*  CALCIUM 8.7*    GFR: Estimated Creatinine Clearance: 27 mL/min (A) (  by C-G formula based on SCr of 1.53 mg/dL (H)). Liver Function Tests: Recent Labs  Lab 03/13/22 0448  AST 19  ALT 12  ALKPHOS 84  BILITOT 0.5  PROT 6.8  ALBUMIN 3.3*   No results for input(s): "LIPASE", "AMYLASE" in the last 168 hours. No results for input(s): "AMMONIA" in the last 168 hours. Coagulation Profile: No results for input(s): "INR", "PROTIME" in the last 168 hours. Cardiac Enzymes: No results for input(s): "CKTOTAL", "CKMB", "CKMBINDEX", "TROPONINI" in the last 168 hours. BNP (last 3 results) No results for input(s): "PROBNP" in the last 8760 hours. HbA1C: No results for input(s): "HGBA1C" in the last 72 hours. CBG: No results for input(s): "GLUCAP" in the last 168 hours. Lipid Profile: No results for input(s): "CHOL", "HDL", "LDLCALC", "TRIG", "CHOLHDL", "LDLDIRECT" in the last 72 hours. Thyroid Function Tests: No results for input(s): "TSH", "T4TOTAL", "FREET4", "T3FREE", "THYROIDAB" in the last 72 hours. Anemia Panel: No results for input(s): "VITAMINB12", "FOLATE", "FERRITIN", "TIBC", "IRON", "RETICCTPCT" in the last 72 hours. Urine analysis:    Component Value Date/Time   COLORURINE STRAW (A) 05/24/2020 0746   APPEARANCEUR CLEAR (A) 05/24/2020 0746   APPEARANCEUR Clear 06/23/2014 1357   LABSPEC 1.016 05/24/2020 0746   LABSPEC 1.020 06/23/2014 1357   PHURINE 5.0 05/24/2020 0746   GLUCOSEU 150 (A) 05/24/2020 0746   GLUCOSEU Negative 06/23/2014 1357   HGBUR NEGATIVE 05/24/2020 0746   BILIRUBINUR NEGATIVE 05/24/2020 0746   BILIRUBINUR Negative 01/19/2020 1112   BILIRUBINUR Negative 06/23/2014 1357   KETONESUR NEGATIVE 05/24/2020 0746   PROTEINUR NEGATIVE 05/24/2020 0746   UROBILINOGEN 0.2 01/19/2020 1112   NITRITE NEGATIVE 05/24/2020 0746   LEUKOCYTESUR SMALL (A) 05/24/2020 0746   LEUKOCYTESUR 1+ 06/23/2014 1357   Sepsis Labs: '@LABRCNTIP'$ (procalcitonin:4,lacticidven:4) ) Recent Results (from  the past 240 hour(s))  Resp panel by RT-PCR (RSV, Flu A&B, Covid) Anterior Nasal Swab     Status: None   Collection Time: 03/13/22  4:48 AM   Specimen: Anterior Nasal Swab  Result Value Ref Range Status   SARS Coronavirus 2 by RT PCR NEGATIVE NEGATIVE Final    Comment: (NOTE) SARS-CoV-2 target nucleic acids are NOT DETECTED.  The SARS-CoV-2 RNA is generally detectable in upper respiratory specimens during the acute phase of infection. The lowest concentration of SARS-CoV-2 viral copies this assay can detect is 138 copies/mL. A negative result does not preclude SARS-Cov-2 infection and should not be used as the sole basis for treatment or other patient management decisions. A negative result may occur with  improper specimen collection/handling, submission of specimen other than nasopharyngeal swab, presence of viral mutation(s) within the areas targeted by this assay, and inadequate number of viral copies(<138 copies/mL). A negative result must be combined with clinical observations, patient history, and epidemiological information. The expected result is Negative.  Fact Sheet for Patients:  EntrepreneurPulse.com.au  Fact Sheet for Healthcare Providers:  IncredibleEmployment.be  This test is no t yet approved or cleared by the [place] FDA and  has been authorized for detection and/or diagnosis of SARS-CoV-2 by FDA under an Emergency Use Authorization (EUA). This EUA will remain  in effect (meaning this test can be used) for the duration of the COVID-19 declaration under Section 564(b)(1) of the Act, 21 U.S.C.section 360bbb-3(b)(1), unless the authorization is terminated  or revoked sooner.       Influenza A by PCR NEGATIVE NEGATIVE Final   Influenza B by PCR NEGATIVE NEGATIVE Final    Comment: (NOTE) The Xpert Xpress SARS-CoV-2/FLU/RSV plus assay is  intended as an aid in the diagnosis of influenza from Nasopharyngeal swab specimens  and should not be used as a sole basis for treatment. Nasal washings and aspirates are unacceptable for Xpert Xpress SARS-CoV-2/FLU/RSV testing.  Fact Sheet for Patients: EntrepreneurPulse.com.au  Fact Sheet for Healthcare Providers: IncredibleEmployment.be  This test is not yet approved or cleared by the [place] FDA and has been authorized for detection and/or diagnosis of SARS-CoV-2 by FDA under an Emergency Use Authorization (EUA). This EUA will remain in effect (meaning this test can be used) for the duration of the COVID-19 declaration under Section 564(b)(1) of the Act, 21 U.S.C. section 360bbb-3(b)(1), unless the authorization is terminated or revoked.     Resp Syncytial Virus by PCR NEGATIVE NEGATIVE Final    Comment: (NOTE) Fact Sheet for Patients: EntrepreneurPulse.com.au  Fact Sheet for Healthcare Providers: IncredibleEmployment.be  This test is not yet approved or cleared by the [place] FDA and has been authorized for detection and/or diagnosis of SARS-CoV-2 by FDA under an Emergency Use Authorization (EUA). This EUA will remain in effect (meaning this test can be used) for the duration of the COVID-19 declaration under Section 564(b)(1) of the Act, 21 U.S.C. section 360bbb-3(b)(1), unless the authorization is terminated or revoked.  Performed at [facility], [address] [place], [place] [address]      Radiological Exams on Admission: DG Chest [facility] 1 View  Result Date: 03/13/2022 CLINICAL DATA:  88 year old female with shortness of breath. Smoker, COPD. EXAM: PORTABLE CHEST 1 VIEW COMPARISON:  Chest radiograph 10/04/2021 and earlier. FINDINGS: Portable AP upright view at 0511 hours. Stable lung volumes, with large AP dimension to the chest demonstrated in July. Stable cardiac size and mediastinal contours. Borderline to mild cardiomegaly. Calcified aortic  atherosclerosis. Visualized tracheal air column is within normal limits. Mildly increased chronic coarse pulmonary interstitial markings at both lung bases greater on the right. No superimposed pneumothorax, edema, effusion, or consolidation. Advanced bilateral shoulder degeneration. Stable visualized osseous structures. IMPRESSION: 1. Chronic lung disease with mildly increased bibasilar reticulonodular opacity suspicious for acute infectious exacerbation in this setting. 2. Aortic Atherosclerosis (ICD10-I70.0). Electronically Signed   By: Genevie Ann M.D.   On: 03/13/2022 05:38      Assessment/Plan Principal Problem:   Acute respiratory failure with hypoxia (HCC) Active Problems:   COPD exacerbation (HCC)   Acute on chronic combined systolic and diastolic CHF (congestive heart failure) (HCC)   Essential hypertension   PSVT (paroxysmal supraventricular tachycardia)   Acute renal failure superimposed on stage 3a chronic kidney disease (HCC)   HLD (hyperlipidemia)   Hypothyroidism   CAD (coronary artery disease)   Obesity with body mass index of 30.0-39.9   Assessment and Plan:  Acute respiratory failure with hypoxia due to combination of COPD exacerbation and CHF exacerbation: Shortness of breath is likely due to combination of COPD and CHF.  Patient has wheeze on auscultation, consistent with COPD exacerbation.  Patient has elevated BNP 478 and 2+ leg edema, consistent with CHF exacerbation.     -Admit to progressive bed as inpatient -BiPAP -Bronchodilators -IV Lasix -Nasal cannula oxygen to maintain oxygen saturation above 93% when pt is off BiPAP   Acute on chronic systolic CHF: -Lasix 40 mg bid by IV - will not repeat 2d echo since pt had on on 03/28/20 which showed EF of 40-45% -Daily weights -strict I/O's -Low salt diet -Fluid restriction -Obtain REDs Vest reading   COPD exacerbation [facility]): Chest x-ray showed possible mild infection -Bronchodilators -Patient  is allergic to  prednisone, will not give more steroid -IV Rocephin and azithromycin -Sputum culture -Incentive spirometry - Urine legionella and S. pneumococcal antigen - Follow up blood culture x2, sputum culture  Hyperlipidemia -Lipitor   Essential hypertension -IV hydralazine as needed -Patient is on IV Lasix -Coreg - hold Cozaar due to worsening renal function   Acute renal failure superimposed on stage 3a chronic kidney disease ([place]): Baseline creatinine 1.01 on 12/12/2021.  Her creatinine is 1.53, BUN 30, GFR 33.  Likely due to cardiorenal syndrome and continuation of Cozaar -Hold Cozaar -Follow-up with BMP closely  Hypothyroidism -Synthroid   Diabetes mellitus without complication [facility]): Recent A1c 6.9, well controlled.  Patient is taking [place] and metformin at home -Sliding scale insulin   PSVT: HR 80s now -Continue home Coreg and  amiodarone   CAD: -ASA and lipitor  Obesity with body mass index of 30.0-39.9: Body weight 80 kg, BMI 30.927 -Exercise and healthy diet -Encouraged to lose weight       DVT ppx:  SQ Lovenox  Code Status: DNR per pt and her son   Family Communication:    Yes, patient's  son at bed side.    Disposition Plan:  Anticipate discharge back to previous environment  Consults called:  none  Admission status and Level of care: Progressive:     as inpt       Dispo: The patient is from: Home              Anticipated d/c is to: Home              Anticipated d/c date is: 2 days              Patient currently is not medically stable to d/c.    Severity of Illness:  The appropriate patient status for this patient is INPATIENT. Inpatient status is judged to be reasonable and necessary in order to provide the required intensity of service to ensure the patient's safety. The patient's presenting symptoms, physical exam findings, and initial radiographic and laboratory data in the context of their chronic comorbidities is felt to place them at high risk  for further clinical deterioration. Furthermore, it is not anticipated that the patient will be medically stable for discharge from the hospital within 2 midnights of admission.   * I certify that at the point of admission it is my clinical judgment that the patient will require inpatient hospital care spanning beyond 2 midnights from the point of admission due to high intensity of service, high risk for further deterioration and high frequency of surveillance required.*       Date of Service 03/13/2022    [facility]   If 7PM-7AM, please contact night-coverage [email or web address] 03/13/2022, 9:35 AM

## 2022-03-13 NOTE — Progress Notes (Signed)
OT Cancellation Note  Patient Details Name: Kathryn [name] MRN: [id number] DOB: 10/07/1933   Cancelled Treatment:    Reason Eval/Treat Not Completed: Medical issues which prohibited therapy. OT order received and chart reviewed. Pt currently on BiPap. OT to hold this date and will re-attempt when pt is able to actively participate in OT intervention.   Darleen Crocker, MS, OTR/L , CBIS ascom [phone]  03/13/22, 9:19 AM

## 2022-03-13 NOTE — Progress Notes (Signed)
PHARMACIST - PHYSICIAN COMMUNICATION  CONCERNING:  Enoxaparin (Lovenox) for DVT Prophylaxis    RECOMMENDATION: Patient was prescribed enoxaprin '40mg'$  q24 hours for VTE prophylaxis.   Filed Weights   03/13/22 0451  Weight: 80 kg (176 lb 5.9 oz)    Body mass index is 30.27 kg/m.  Estimated Creatinine Clearance: 27 mL/min (A) (by C-G formula based on SCr of 1.53 mg/dL (H)).   Patient is candidate for enoxaparin '30mg'$  every 24 hours based on CrCl <94m/min or Weight <45kg  DESCRIPTION: Pharmacy has [facility]  Patient is now receiving enoxaparin 30 mg every 24 hours    HLorin Picket PharmD Clinical Pharmacist  03/13/2022 8:24 AM

## 2022-03-13 NOTE — Progress Notes (Signed)
PT Cancellation Note  Patient Details Name: Kathryn [name] MRN: [id number] DOB: [birth year]   Cancelled Treatment:    Reason Eval/Treat Not Completed: Medical issues which prohibited therapy Patient currently on BiPAP. PT will hold and re-attempt at later date/time as medically appropriate.   Kasim Mccorkle A. Gilford Rile PT, DPT [facility] - Acute Rehabilitation Services    Delight Bickle A Blanch Stang 03/13/2022, 10:43 AM

## 2022-03-14 DIAGNOSIS — J441 Chronic obstructive pulmonary disease with (acute) exacerbation: Secondary | ICD-10-CM | POA: Diagnosis not present

## 2022-03-14 DIAGNOSIS — J9601 Acute respiratory failure with hypoxia: Secondary | ICD-10-CM | POA: Diagnosis not present

## 2022-03-14 DIAGNOSIS — I5043 Acute on chronic combined systolic (congestive) and diastolic (congestive) heart failure: Secondary | ICD-10-CM | POA: Diagnosis not present

## 2022-03-14 LAB — BASIC METABOLIC PANEL
Anion gap: 5 (ref 5–15)
BUN: 21 mg/dL (ref 8–23)
CO2: 29 mmol/L (ref 22–32)
Calcium: 8.8 mg/dL — ABNORMAL LOW (ref 8.9–10.3)
Chloride: 107 mmol/L (ref 98–111)
Creatinine, Ser: 1.07 mg/dL — ABNORMAL HIGH (ref 0.44–1.00)
GFR, Estimated: 51 mL/min — ABNORMAL LOW (ref >60)
Glucose, Bld: 148 mg/dL — ABNORMAL HIGH (ref 70–99)
Potassium: 3.7 mmol/L (ref 3.5–5.1)
Sodium: 141 mmol/L (ref 135–145)

## 2022-03-14 LAB — CBC
HCT: 28.9 % — ABNORMAL LOW (ref 36.0–46.0)
Hemoglobin: 7.9 g/dL — ABNORMAL LOW (ref 12.0–15.0)
MCH: 22.1 pg — ABNORMAL LOW (ref 26.0–34.0)
MCHC: 27.3 g/dL — ABNORMAL LOW (ref 30.0–36.0)
MCV: 80.7 fL (ref 80.0–100.0)
Platelets: 237 10*3/uL (ref 150–400)
RBC: 3.58 MIL/uL — ABNORMAL LOW (ref 3.87–5.11)
RDW: 18.1 % — ABNORMAL HIGH (ref 11.5–15.5)
WBC: 8.3 10*3/uL (ref 4.0–10.5)
nRBC: 0 % (ref 0.0–0.2)

## 2022-03-14 LAB — BLOOD CULTURE ID PANEL (REFLEXED) - BCID2

## 2022-03-14 LAB — GLUCOSE, CAPILLARY
Glucose-Capillary: 147 mg/dL — ABNORMAL HIGH (ref 70–99)
Glucose-Capillary: 172 mg/dL — ABNORMAL HIGH (ref 70–99)
Glucose-Capillary: 139 mg/dL — ABNORMAL HIGH (ref 70–99)
Glucose-Capillary: 138 mg/dL — ABNORMAL HIGH (ref 70–99)
Glucose-Capillary: 162 mg/dL — ABNORMAL HIGH (ref 70–99)

## 2022-03-14 LAB — MAGNESIUM: Magnesium: 1.9 mg/dL (ref 1.7–2.4)

## 2022-03-14 MED ORDER — IPRATROPIUM-ALBUTEROL 0.5-2.5 (3) MG/3ML IN SOLN
3.0000 mL | Freq: Two times a day (BID) | RESPIRATORY_TRACT | Status: DC
  Administered 2022-03-14 – 2022-03-16 (×4): 3 mL via RESPIRATORY_TRACT
  Filled 2022-03-14 (×3): qty 3

## 2022-03-14 MED ORDER — SODIUM CHLORIDE 0.9 % IV SOLN: INTRAVENOUS | Status: DC | PRN

## 2022-03-14 MED ORDER — SODIUM CHLORIDE 0.9 % IV SOLN
2.0000 g | INTRAVENOUS | Status: DC
  Administered 2022-03-15 – 2022-03-16 (×2): 2 g via INTRAVENOUS
  Filled 2022-03-14: qty 2
  Filled 2022-03-14: qty 20

## 2022-03-14 MED ORDER — ENOXAPARIN SODIUM 40 MG/0.4ML IJ SOSY
40.0000 mg | PREFILLED_SYRINGE | INTRAMUSCULAR | Status: DC
  Administered 2022-03-15: 40 mg via SUBCUTANEOUS
  Filled 2022-03-14: qty 0.4

## 2022-03-14 NOTE — Progress Notes (Signed)
PHARMACY - PHYSICIAN COMMUNICATION CRITICAL VALUE ALERT - BLOOD CULTURE IDENTIFICATION (BCID)  Kathryn [name] is an 88 y.o. female who presented to [facility] on 03/13/2022 with a chief complaint of Shortness of breath  Assessment:  Blood culture from 12/19 with GPC in 1 of 4 bottles currently, BCID detects Staphylococcus species (NOT S aureus).   Name of physician (or Provider) Contacted: Dr Mal Misty  Current antibiotics: Ceftriaxone/azithromycin  Changes to prescribed antibiotics recommended:  Patient is on recommended antibiotics - No changes needed Likely contaminant, monitor   Results for orders placed or performed during the hospital encounter of 05/24/20  Blood Culture ID Panel (Reflexed) (Collected: 05/24/2020  7:46 AM)  Result Value Ref Range   Enterococcus faecalis NOT DETECTED NOT DETECTED   Enterococcus Faecium NOT DETECTED NOT DETECTED   Listeria monocytogenes NOT DETECTED NOT DETECTED   Staphylococcus species DETECTED (A) NOT DETECTED   Staphylococcus aureus (BCID) NOT DETECTED NOT DETECTED   Staphylococcus epidermidis NOT DETECTED NOT DETECTED   Staphylococcus lugdunensis NOT DETECTED NOT DETECTED   Streptococcus species NOT DETECTED NOT DETECTED   Streptococcus agalactiae NOT DETECTED NOT DETECTED   Streptococcus pneumoniae NOT DETECTED NOT DETECTED   Streptococcus pyogenes NOT DETECTED NOT DETECTED   A.calcoaceticus-baumannii NOT DETECTED NOT DETECTED   Bacteroides fragilis NOT DETECTED NOT DETECTED   Enterobacterales NOT DETECTED NOT DETECTED   Enterobacter cloacae complex NOT DETECTED NOT DETECTED   Escherichia coli NOT DETECTED NOT DETECTED   Klebsiella aerogenes NOT DETECTED NOT DETECTED   Klebsiella oxytoca NOT DETECTED NOT DETECTED   Klebsiella pneumoniae NOT DETECTED NOT DETECTED   Proteus species NOT DETECTED NOT DETECTED   Salmonella species NOT DETECTED NOT DETECTED   Serratia marcescens NOT DETECTED NOT DETECTED   Haemophilus influenzae NOT DETECTED  NOT DETECTED   Neisseria meningitidis NOT DETECTED NOT DETECTED   Pseudomonas aeruginosa NOT DETECTED NOT DETECTED   Stenotrophomonas maltophilia NOT DETECTED NOT DETECTED   Candida albicans NOT DETECTED NOT DETECTED   Candida auris NOT DETECTED NOT DETECTED   Candida glabrata NOT DETECTED NOT DETECTED   Candida krusei NOT DETECTED NOT DETECTED   Candida parapsilosis NOT DETECTED NOT DETECTED   Candida tropicalis NOT DETECTED NOT DETECTED   Cryptococcus neoformans/gattii NOT DETECTED NOT DETECTED    Doreene Eland, PharmD, BCPS, BCIDP Work Cell: [phone] 03/14/2022 11:03 AM

## 2022-03-14 NOTE — Evaluation (Addendum)
Occupational Therapy Evaluation Patient Details Name: Kathryn [name] MRN: [id number] DOB: [birth year] Today's Date: 03/14/2022   History of Present Illness 88 y/o female presented to ED on 03/13/22 for SOB. Found to be hypoxic in the ED and placed on BiPAP. Admitted for acute respiratory failure 2/2 COPD and CHF exacerbation. PMH: CHF, COPD, CAD, HTN, CKD, PVD   Clinical Impression   Kathryn [name] was seen for OT evaluation this date. Prior to hospital admission, pt was MOD I using rollator for mobility and ADLs. Pt lives in addition to sons house with assistance as needed. Pt presents to acute OT demonstrating impaired ADL performance and functional mobility 2/2 decreased activity tolerance and functional strength/ROM/balance deficits. Pt currently requires no physical assist for bed mobility including bridging. MOD I don B shoes seated EOB, SBA don underwear sit<>stand. SBA + RW toilet t/f, SpO2 93% on RA t/o. Pt would benefit from skilled OT to address ECS and falls prevention strategies. Upon hospital discharge, recommend no OT follow up.   Recommendations for follow up therapy are one component of a multi-disciplinary discharge planning process, led by the attending physician.  Recommendations may be updated based on patient status, additional functional criteria and insurance authorization.   Follow Up Recommendations  No OT follow up     Assistance Recommended at Discharge Intermittent Supervision/Assistance  Patient can return home with the following Help with stairs or ramp for entrance;Assistance with cooking/housework    Functional Status Assessment  Patient has had a recent decline in their functional status and demonstrates the ability to make significant improvements in function in a reasonable and predictable amount of time.  Equipment Recommendations  None recommended by OT    Recommendations for Other Services       Precautions / Restrictions Precautions Precautions:  Fall Restrictions Weight Bearing Restrictions: No      Mobility Bed Mobility Overal bed mobility: Modified Independent                  Transfers Overall transfer level: Needs assistance Equipment used: Rolling walker (2 wheels) Transfers: Sit to/from Stand Sit to Stand: Min guard                  Balance Overall balance assessment: Needs assistance Sitting-balance support: No upper extremity supported, Feet supported Sitting balance-Leahy Scale: Good     Standing balance support: No upper extremity supported, During functional activity Standing balance-Leahy Scale: Fair                             ADL either performed or assessed with clinical judgement   ADL Overall ADL's : Needs assistance/impaired                                       General ADL Comments: MOD I don B shoes seated EOB, SBA don underwear sit<>stand. SBA + RW toilet t/f      Pertinent Vitals/Pain Pain Assessment Pain Assessment: No/denies pain     Hand Dominance     Extremity/Trunk Assessment Upper Extremity Assessment Upper Extremity Assessment: Overall WFL for tasks assessed   Lower Extremity Assessment Lower Extremity Assessment: Overall WFL for tasks assessed       Communication Communication Communication: HOH   Cognition Arousal/Alertness: Awake/alert Behavior During Therapy: WFL for tasks assessed/performed Overall Cognitive Status: Within Functional Limits for tasks assessed  General Comments  SpO2 93% on RA t/o            Home Living Family/patient expects to be discharged to:: Private residence Living Arrangements: Children Available Help at Discharge: Family Type of Home: [place]: Rollator (4 wheels);Wheelchair - manual   Additional Comments: reports living in an addition to her sons house      Prior  Functioning/Environment Prior Level of Function : Independent/Modified Independent             Mobility Comments: rollator household, w/c for community distances, reports 3 recent falls ADLs Comments: assist for IADLs        OT Problem List: Decreased strength;Decreased activity tolerance;Impaired balance (sitting and/or standing);Decreased safety awareness      OT Treatment/Interventions: Self-care/ADL training;Therapeutic exercise;Energy conservation;DME and/or AE instruction;Therapeutic activities;Patient/family education;Balance training    OT Goals(Current goals can be found in the care plan section) Acute Rehab OT Goals Patient Stated Goal: to go home OT Goal Formulation: With patient Time For Goal Achievement: 03/28/22 Potential to Achieve Goals: Good ADL Goals Pt Will Perform Grooming: Independently;standing Pt Will Perform Lower Body Dressing: Independently;sit to/from stand Pt Will Transfer to Toilet: Independently;ambulating;regular height toilet  OT Frequency: Min 2X/week    Co-evaluation              AM-PAC OT "6 Clicks" Daily Activity     Outcome Measure Help from another person eating meals?: None Help from another person taking care of personal grooming?: A Little Help from another person toileting, which includes using toliet, bedpan, or urinal?: A Little Help from another person bathing (including washing, rinsing, drying)?: A Little Help from another person to put on and taking off regular upper body clothing?: None Help from another person to put on and taking off regular lower body clothing?: A Little 6 Click Score: 20   End of Session Equipment Utilized During Treatment: Rolling walker (2 wheels) Nurse Communication: Other (comment) (O2 status)  Activity Tolerance: Patient tolerated treatment well Patient left: in chair;with call bell/phone within reach;with nursing/sitter in room  OT Visit Diagnosis: Other abnormalities of gait and mobility  (R26.89);History of falling (Z91.81)                Time: 1740-8144 OT Time Calculation (min): 19 min Charges:  OT General Charges $OT Visit: 1 Visit OT Evaluation $OT Eval Moderate Complexity: 1 Mod OT Treatments $Self Care/Home Management : 8-22 mins  Dessie Coma, M.S. OTR/L  03/14/22, 2:35 PM  ascom [phone]

## 2022-03-14 NOTE — Progress Notes (Addendum)
Progress Note    BEAUTIFUL PENSYL  RWE:[id number] DOB: 23-May-1933  DOA: 03/13/2022 PCP: Jerrol Banana., MD      Brief Narrative:    Medical records reviewed and are as summarized below:  Kathryn [name] is a 88 y.o. female  with medical history significant of hypertension, hyperlipidemia, diabetes mellitus, COPD not using oxygen at baseline, GERD, hypothyroidism, skin cancer, PVD, left bundle blockade, gastric ulcer, CHF with EF of 40-45%, former smoker, who presented with shortness of breath.       Assessment/Plan:   Principal Problem:   Acute respiratory failure with hypoxia (HCC) Active Problems:   COPD exacerbation (HCC)   Acute on chronic combined systolic and diastolic CHF (congestive heart failure) (HCC)   Essential hypertension   PSVT (paroxysmal supraventricular tachycardia)   Acute renal failure superimposed on stage 3a chronic kidney disease (HCC)   HLD (hyperlipidemia)   Hypothyroidism   CAD (coronary artery disease)   Obesity with body mass index of 30.0-39.9    Body mass index is 31.71 kg/m.  (Obesity)   Acute hypoxic respiratory failure: She has been weaned off of BiPAP.  She is on 2 L/min oxygen via [place].  Taper off oxygen as able.  Acute on chronic systolic and diastolic CHF: Continue IV Lasix.  2D echo in January 2022 showed EF estimated at 40 to 61%, grade 2 diastolic dysfunction.  Monitor BMP, daily weight and urine output.  COPD exacerbation: Continue bronchodilators.  Staph species bacteremia isolated and only 1 out of 4 bottles.  This is likely a contaminant.  AKI on CKD stage IIIa: Creatinine is stable.  Monitor creatinine while on IV Lasix  Other comorbidities include hypertension, PSVT, CAD, type II DM (recent HbA1c was 6.9), hypothyroidism, hyperlipidemia    Diet Order             Diet 2 gram sodium Room service appropriate? Yes; Fluid consistency: Thin  Diet effective now                             Consultants: None  Procedures: None    Medications:    amiodarone  100 mg Oral Daily   aspirin EC  81 mg Oral Daily   atorvastatin  40 mg Oral Daily   carvedilol  6.25 mg Oral BID WC   [START ON 03/15/2022] enoxaparin (LOVENOX) injection  40 mg Subcutaneous Q24H   furosemide  40 mg Intravenous Q12H   gabapentin  400 mg Oral BID   insulin aspart  0-5 Units Subcutaneous QHS   insulin aspart  0-9 Units Subcutaneous TID WC   ipratropium-albuterol  3 mL Nebulization BID   levothyroxine  100 mcg Oral Q0600   Continuous Infusions:  sodium chloride Stopped (03/14/22 0905)   azithromycin Stopped (03/14/22 0901)   [START ON 03/15/2022] cefTRIAXone (ROCEPHIN)  IV       Anti-infectives (From admission, onward)    Start     Dose/Rate Route Frequency Ordered Stop   03/15/22 0600  cefTRIAXone (ROCEPHIN) 2 g in sodium chloride 0.9 % 100 mL IVPB        2 g 200 mL/hr over 30 Minutes Intravenous Every 24 hours 03/14/22 1107     03/14/22 0600  cefTRIAXone (ROCEPHIN) 1 g in sodium chloride 0.9 % 100 mL IVPB  Status:  Discontinued        1 g 200 mL/hr over 30 Minutes Intravenous Every 24 hours 03/13/22  3244 03/14/22 1107   03/14/22 0600  azithromycin (ZITHROMAX) 500 mg in sodium chloride 0.9 % 250 mL IVPB        500 mg 250 mL/hr over 60 Minutes Intravenous Every 24 hours 03/13/22 0813     03/13/22 0615  cefTRIAXone (ROCEPHIN) 1 g in sodium chloride 0.9 % 100 mL IVPB        1 g 200 mL/hr over 30 Minutes Intravenous  Once 03/13/22 0609 03/13/22 0912   03/13/22 0615  azithromycin (ZITHROMAX) 500 mg in sodium chloride 0.9 % 250 mL IVPB        500 mg 250 mL/hr over 60 Minutes Intravenous  Once 03/13/22 0102 03/13/22 1111              Family Communication/Anticipated D/C date and plan/Code Status   DVT prophylaxis: enoxaparin (LOVENOX) injection 40 mg Start: 03/15/22 1000     Code Status: DNR  Family Communication: None Disposition Plan: Plan to discharge  home in 1 to 2 days   Status is: Inpatient Remains inpatient appropriate because: IV Lasix for CHF exacerbation       Subjective:   Interval events noted.  She complains of shortness of breath with exertion but she feels better today compared to yesterday.  She said she normally gets around in wheelchair or a walker.  Objective:    Vitals:   03/14/22 0118 03/14/22 0743 03/14/22 0824 03/14/22 1203  BP: (!) 140/57  130/65 102/79  Pulse: 74  81 77  Resp: '20  16 16  '$ Temp: 98.1 F (36.7 C)  97.8 F (36.6 C) 97.9 F (36.6 C)  TempSrc:      SpO2: 98% 98% 100% (!) 83%  Weight: 83.8 kg     Height: '5\' 4"'$  (1.626 m)      No data found.   Intake/Output Summary (Last 24 hours) at 03/14/2022 1633 Last data filed at 03/14/2022 1546 Gross per 24 hour  Intake 593.65 ml  Output 1500 ml  Net -906.35 ml   Filed Weights   03/13/22 0451 03/14/22 0118  Weight: 80 kg 83.8 kg    Exam:  GEN: NAD SKIN: Warm and dry EYES: No pallor or icterus ENT: MMM CV: RRR PULM: Coarse breath sounds bilaterally, no wheezing or rales heard ABD: soft, ND, NT, +BS CNS: AAO x 3, non focal EXT: No edema or tenderness        Data Reviewed:   I have personally reviewed following labs and imaging studies:  Labs: Labs show the following:   Basic Metabolic Panel: Recent Labs  Lab 03/13/22 0448 03/14/22 0531  NA 144 141  K 4.4 3.7  CL 113* 107  CO2 27 29  GLUCOSE 194* 148*  BUN 30* 21  CREATININE 1.53* 1.07*  CALCIUM 8.7* 8.8*  MG  --  1.9   GFR Estimated Creatinine Clearance: 39.5 mL/min (A) (by C-G formula based on SCr of 1.07 mg/dL (H)). Liver Function Tests: Recent Labs  Lab 03/13/22 0448  AST 19  ALT 12  ALKPHOS 84  BILITOT 0.5  PROT 6.8  ALBUMIN 3.3*   No results for input(s): "LIPASE", "AMYLASE" in the last 168 hours. No results for input(s): "AMMONIA" in the last 168 hours. Coagulation profile No results for input(s): "INR", "PROTIME" in the last 168  hours.  CBC: Recent Labs  Lab 03/13/22 0448 03/14/22 0531  WBC 9.0 8.3  HGB 8.7* 7.9*  HCT 32.2* 28.9*  MCV 82.8 80.7  PLT 307 237   Cardiac Enzymes:  No results for input(s): "CKTOTAL", "CKMB", "CKMBINDEX", "TROPONINI" in the last 168 hours. BNP (last 3 results) No results for input(s): "PROBNP" in the last 8760 hours. CBG: Recent Labs  Lab 03/13/22 1652 03/13/22 2107 03/14/22 0213 03/14/22 0825 03/14/22 1204  GLUCAP 140* 162* 147* 172* 139*   D-Dimer: No results for input(s): "DDIMER" in the last 72 hours. Hgb A1c: No results for input(s): "HGBA1C" in the last 72 hours. Lipid Profile: No results for input(s): "CHOL", "HDL", "LDLCALC", "TRIG", "CHOLHDL", "LDLDIRECT" in the last 72 hours. Thyroid function studies: No results for input(s): "TSH", "T4TOTAL", "T3FREE", "THYROIDAB" in the last 72 hours.  Invalid input(s): "FREET3" Anemia work up: No results for input(s): "VITAMINB12", "FOLATE", "FERRITIN", "TIBC", "IRON", "RETICCTPCT" in the last 72 hours. Sepsis Labs: Recent Labs  Lab 03/13/22 0448 03/14/22 0531  WBC 9.0 8.3    Microbiology Recent Results (from the past 240 hour(s))  Resp panel by RT-PCR (RSV, Flu A&B, Covid) Anterior Nasal Swab     Status: None   Collection Time: 03/13/22  4:48 AM   Specimen: Anterior Nasal Swab  Result Value Ref Range Status   SARS Coronavirus 2 by RT PCR NEGATIVE NEGATIVE Final    Comment: (NOTE) SARS-CoV-2 target nucleic acids are NOT DETECTED.  The SARS-CoV-2 RNA is generally detectable in upper respiratory specimens during the acute phase of infection. The lowest concentration of SARS-CoV-2 viral copies this assay can detect is 138 copies/mL. A negative result does not preclude SARS-Cov-2 infection and should not be used as the sole basis for treatment or other patient management decisions. A negative result may occur with  improper specimen collection/handling, submission of specimen other than nasopharyngeal swab,  presence of viral mutation(s) within the areas targeted by this assay, and inadequate number of viral copies(<138 copies/mL). A negative result must be combined with clinical observations, patient history, and epidemiological information. The expected result is Negative.  Fact Sheet for Patients:  EntrepreneurPulse.com.au  Fact Sheet for Healthcare Providers:  IncredibleEmployment.be  This test is no t yet approved or cleared by the [place] FDA and  has been authorized for detection and/or diagnosis of SARS-CoV-2 by FDA under an Emergency Use Authorization (EUA). This EUA will remain  in effect (meaning this test can be used) for the duration of the COVID-19 declaration under Section 564(b)(1) of the Act, 21 U.S.C.section 360bbb-3(b)(1), unless the authorization is terminated  or revoked sooner.       Influenza A by PCR NEGATIVE NEGATIVE Final   Influenza B by PCR NEGATIVE NEGATIVE Final    Comment: (NOTE) The Xpert Xpress SARS-CoV-2/FLU/RSV plus assay is intended as an aid in the diagnosis of influenza from Nasopharyngeal swab specimens and should not be used as a sole basis for treatment. Nasal washings and aspirates are unacceptable for Xpert Xpress SARS-CoV-2/FLU/RSV testing.  Fact Sheet for Patients: EntrepreneurPulse.com.au  Fact Sheet for Healthcare Providers: IncredibleEmployment.be  This test is not yet approved or cleared by the [place] FDA and has been authorized for detection and/or diagnosis of SARS-CoV-2 by FDA under an Emergency Use Authorization (EUA). This EUA will remain in effect (meaning this test can be used) for the duration of the COVID-19 declaration under Section 564(b)(1) of the Act, 21 U.S.C. section 360bbb-3(b)(1), unless the authorization is terminated or revoked.     Resp Syncytial Virus by PCR NEGATIVE NEGATIVE Final    Comment: (NOTE) Fact Sheet for  Patients: EntrepreneurPulse.com.au  Fact Sheet for Healthcare Providers: IncredibleEmployment.be  This test is not yet approved or  cleared by the [place] and has been authorized for detection and/or diagnosis of SARS-CoV-2 by FDA under an Emergency Use Authorization (EUA). This EUA will remain in effect (meaning this test can be used) for the duration of the COVID-19 declaration under Section 564(b)(1) of the Act, 21 U.S.C. section 360bbb-3(b)(1), unless the authorization is terminated or revoked.  Performed at [facility], [address] [place], [place] [address]   Blood culture (routine x 2)     Status: None (Preliminary result)   Collection Time: 03/13/22  6:10 AM   Specimen: Right Antecubital; Blood  Result Value Ref Range Status   Specimen Description RIGHT ANTECUBITAL  Final   Special Requests   Final    BOTTLES DRAWN AEROBIC AND ANAEROBIC Blood Culture adequate volume   Culture  Setup Time   Final    Organism ID to follow GRAM POSITIVE COCCI AEROBIC BOTTLE ONLY CRITICAL RESULT CALLED TO, READ BACK BY AND VERIFIED WITH: Rito Ehrlich 03/14/22 1044 KLW Performed at [facility], [address] [place], [place] [address]    Culture GRAM POSITIVE COCCI  Final   Report Status PENDING  Incomplete  Blood Culture ID Panel (Reflexed)     Status: Abnormal   Collection Time: 03/13/22  6:10 AM  Result Value Ref Range Status   Enterococcus faecalis NOT DETECTED NOT DETECTED Final   Enterococcus Faecium NOT DETECTED NOT DETECTED Final   Listeria monocytogenes NOT DETECTED NOT DETECTED Final   Staphylococcus species DETECTED (A) NOT DETECTED Final    Comment: CRITICAL RESULT CALLED TO, READ BACK BY AND VERIFIED WITH: WALID NAZARI 03/14/22 1044 KLW    Staphylococcus aureus (BCID) NOT DETECTED NOT DETECTED Final   Staphylococcus epidermidis NOT DETECTED NOT DETECTED Final   Staphylococcus lugdunensis NOT DETECTED  NOT DETECTED Final   Streptococcus species NOT DETECTED NOT DETECTED Final   Streptococcus agalactiae NOT DETECTED NOT DETECTED Final   Streptococcus pneumoniae NOT DETECTED NOT DETECTED Final   Streptococcus pyogenes NOT DETECTED NOT DETECTED Final   A.calcoaceticus-baumannii NOT DETECTED NOT DETECTED Final   Bacteroides fragilis NOT DETECTED NOT DETECTED Final   Enterobacterales NOT DETECTED NOT DETECTED Final   Enterobacter cloacae complex NOT DETECTED NOT DETECTED Final   Escherichia coli NOT DETECTED NOT DETECTED Final   Klebsiella aerogenes NOT DETECTED NOT DETECTED Final   Klebsiella oxytoca NOT DETECTED NOT DETECTED Final   Klebsiella pneumoniae NOT DETECTED NOT DETECTED Final   Proteus species NOT DETECTED NOT DETECTED Final   Salmonella species NOT DETECTED NOT DETECTED Final   Serratia marcescens NOT DETECTED NOT DETECTED Final   Haemophilus influenzae NOT DETECTED NOT DETECTED Final   Neisseria meningitidis NOT DETECTED NOT DETECTED Final   Pseudomonas aeruginosa NOT DETECTED NOT DETECTED Final   Stenotrophomonas maltophilia NOT DETECTED NOT DETECTED Final   Candida albicans NOT DETECTED NOT DETECTED Final   Candida auris NOT DETECTED NOT DETECTED Final   Candida glabrata NOT DETECTED NOT DETECTED Final   Candida krusei NOT DETECTED NOT DETECTED Final   Candida parapsilosis NOT DETECTED NOT DETECTED Final   Candida tropicalis NOT DETECTED NOT DETECTED Final   Cryptococcus neoformans/gattii NOT DETECTED NOT DETECTED Final    Comment: Performed at [facility], [address] [place], [place] [address]  Blood culture (routine x 2)     Status: None (Preliminary result)   Collection Time: 03/13/22  7:20 AM   Specimen: BLOOD  Result Value Ref Range Status   Specimen Description BLOOD BLOOD RIGHT ARM  Final   Special Requests   Final    BOTTLES DRAWN AEROBIC AND ANAEROBIC Blood Culture adequate volume   Culture   Final    NO GROWTH 1 DAY Performed at [facility], [address] [place], [place] [address]    Report Status PENDING  Incomplete    Procedures and diagnostic studies:  DG Chest Port 1 View  Result Date: 03/13/2022 CLINICAL DATA:  88 year old female with shortness of breath. Smoker, COPD. EXAM: PORTABLE CHEST 1 VIEW COMPARISON:  Chest radiograph 10/04/2021 and earlier. FINDINGS: Portable AP upright view at 0511 hours. Stable lung volumes, with large AP dimension to the chest demonstrated in July. Stable cardiac size and mediastinal contours. Borderline to mild cardiomegaly. Calcified aortic atherosclerosis. Visualized tracheal air column is within normal limits. Mildly increased chronic coarse pulmonary interstitial markings at both lung bases greater on the right. No superimposed pneumothorax, edema, effusion, or consolidation. Advanced bilateral shoulder degeneration. Stable visualized osseous structures. IMPRESSION: 1. Chronic lung disease with mildly increased bibasilar reticulonodular opacity suspicious for acute infectious exacerbation in this setting. 2. Aortic Atherosclerosis (ICD10-I70.0). Electronically Signed   By: Genevie Ann M.D.   On: 03/13/2022 05:38               LOS: 1 day   Kathryn [name]  [facility]   Pager on [email or web address]. If 7PM-7AM, please contact night-coverage at [email or web address]     03/14/2022, 4:33 PM

## 2022-03-14 NOTE — Evaluation (Signed)
Physical Therapy Evaluation Patient Details Name: Kathryn [name] MRN: [id number] DOB: May 12, 1933 Today's Date: 03/14/2022  History of Present Illness  88 y/o female presented to ED on 03/13/22 for SOB. Found to be hypoxic in the ED and placed on BiPAP. Admitted for acute respiratory failure 2/2 COPD and CHF exacerbation. PMH: CHF, COPD, CAD, HTN, CKD, PVD  Clinical Impression  Patient admitted with the above. PTA, patient lives in addition to son's house with PRN assistance. Ambulates with rollator at baseline and reports 3 recent falls. Patient presents with impaired balance, decreased activity tolerance, and weakness. Ambulated within room with min guard and RW. On RA throughout, spO2 90% at end of mobility. Patient with frequent urination since lasix administration which limits mobility at this time. Patient will benefit from skilled PT services during acute stay to address listed deficits. No PT follow up recommended at this time. Will follow acutely.        Recommendations for follow up therapy are one component of a multi-disciplinary discharge planning process, led by the attending physician.  Recommendations may be updated based on patient status, additional functional criteria and insurance authorization.  Follow Up Recommendations No PT follow up      Assistance Recommended at Discharge PRN  Patient can return home with the following       Equipment Recommendations None recommended by PT  Recommendations for Other Services       Functional Status Assessment Patient has had a recent decline in their functional status and demonstrates the ability to make significant improvements in function in a reasonable and predictable amount of time.     Precautions / Restrictions Precautions Precautions: Fall Restrictions Weight Bearing Restrictions: No      Mobility  Bed Mobility Overal bed mobility: Modified Independent                  Transfers Overall transfer  level: Needs assistance Equipment used: Rolling Jame Morrell (2 wheels) Transfers: Sit to/from Stand Sit to Stand: Supervision                Ambulation/Gait Ambulation/Gait assistance: Supervision Gait Distance (Feet): 25 Feet Assistive device: Rolling Bo Teicher (2 wheels) Gait Pattern/deviations: Step-through pattern, Decreased stride length Gait velocity: decreased     General Gait Details: supervision for safety  Stairs            Wheelchair Mobility    Modified Rankin (Stroke Patients Only)       Balance Overall balance assessment: Needs assistance Sitting-balance support: No upper extremity supported, Feet supported Sitting balance-Leahy Scale: Good     Standing balance support: No upper extremity supported, During functional activity Standing balance-Leahy Scale: Fair                               Pertinent Vitals/Pain Pain Assessment Pain Assessment: No/denies pain    Home Living Family/patient expects to be discharged to:: Private residence Living Arrangements: Children Available Help at Discharge: Family Type of Home: House Home Access: Stairs to enter   Technical brewer of Steps: 2   Home Layout: One level Home Equipment: Rollator (4 wheels);Wheelchair - manual Additional Comments: reports living in an addition to her sons house    Prior Function Prior Level of Function : Independent/Modified Independent             Mobility Comments: rollator household, w/c for community distances, reports 3 recent falls ADLs Comments: assist for IADLs  Hand Dominance        Extremity/Trunk Assessment   Upper Extremity Assessment Upper Extremity Assessment: Overall WFL for tasks assessed    Lower Extremity Assessment Lower Extremity Assessment: Generalized weakness       Communication   Communication: HOH  Cognition Arousal/Alertness: Awake/alert Behavior During Therapy: WFL for tasks assessed/performed Overall  Cognitive Status: Within Functional Limits for tasks assessed                                          General Comments General comments (skin integrity, edema, etc.): spO2 90% on RA at end of mobility    Exercises     Assessment/Plan    PT Assessment Patient needs continued PT services  PT Problem List Decreased strength;Decreased activity tolerance;Decreased balance;Decreased mobility;Cardiopulmonary status limiting activity       PT Treatment Interventions DME instruction;Gait training;Functional mobility training;Stair training;Therapeutic activities;Therapeutic exercise;Balance training;Patient/family education    PT Goals (Current goals can be found in the Care Plan section)  Acute Rehab PT Goals Patient Stated Goal: to go home PT Goal Formulation: With patient Time For Goal Achievement: 03/28/22 Potential to Achieve Goals: Good    Frequency Min 2X/week     Co-evaluation               AM-PAC PT "6 Clicks" Mobility  Outcome Measure Help needed turning from your back to your side while in a flat bed without using bedrails?: None Help needed moving from lying on your back to sitting on the side of a flat bed without using bedrails?: None Help needed moving to and from a bed to a chair (including a wheelchair)?: A Little Help needed standing up from a chair using your arms (e.g., wheelchair or bedside chair)?: A Little Help needed to walk in hospital room?: A Little Help needed climbing 3-5 steps with a railing? : A Little 6 Click Score: 20    End of Session   Activity Tolerance: Patient tolerated treatment well Patient left: in bed;with call bell/phone within reach;with bed alarm set Nurse Communication: Mobility status PT Visit Diagnosis: Unsteadiness on feet (R26.81);Muscle weakness (generalized) (M62.81);History of falling (Z91.81)    Time: 1027-2536 PT Time Calculation (min) (ACUTE ONLY): 21 min   Charges:   PT Evaluation $PT Eval  Low Complexity: 1 Low          Anik Wesch A. Gilford Rile PT, DPT [facility] - Acute Rehabilitation Services   Alvia Jablonski A Molli Gethers 03/14/2022, 3:01 PM

## 2022-03-15 DIAGNOSIS — I5043 Acute on chronic combined systolic (congestive) and diastolic (congestive) heart failure: Secondary | ICD-10-CM | POA: Diagnosis not present

## 2022-03-15 DIAGNOSIS — J441 Chronic obstructive pulmonary disease with (acute) exacerbation: Secondary | ICD-10-CM | POA: Diagnosis not present

## 2022-03-15 DIAGNOSIS — N179 Acute kidney failure, unspecified: Secondary | ICD-10-CM | POA: Diagnosis not present

## 2022-03-15 DIAGNOSIS — J9601 Acute respiratory failure with hypoxia: Secondary | ICD-10-CM | POA: Diagnosis not present

## 2022-03-15 LAB — GLUCOSE, CAPILLARY
Glucose-Capillary: 203 mg/dL — ABNORMAL HIGH (ref 70–99)
Glucose-Capillary: 187 mg/dL — ABNORMAL HIGH (ref 70–99)
Glucose-Capillary: 133 mg/dL — ABNORMAL HIGH (ref 70–99)
Glucose-Capillary: 161 mg/dL — ABNORMAL HIGH (ref 70–99)

## 2022-03-15 LAB — BASIC METABOLIC PANEL
Anion gap: 7 (ref 5–15)
BUN: 22 mg/dL (ref 8–23)
CO2: 32 mmol/L (ref 22–32)
Calcium: 9.4 mg/dL (ref 8.9–10.3)
Chloride: 101 mmol/L (ref 98–111)
Creatinine, Ser: 1.14 mg/dL — ABNORMAL HIGH (ref 0.44–1.00)
GFR, Estimated: 47 mL/min — ABNORMAL LOW (ref >60)
Glucose, Bld: 167 mg/dL — ABNORMAL HIGH (ref 70–99)
Potassium: 4 mmol/L (ref 3.5–5.1)
Sodium: 140 mmol/L (ref 135–145)

## 2022-03-15 LAB — MAGNESIUM: Magnesium: 1.9 mg/dL (ref 1.7–2.4)

## 2022-03-15 MED ORDER — TORSEMIDE 20 MG PO TABS
20.0000 mg | ORAL_TABLET | Freq: Every day | ORAL | Status: DC
  Administered 2022-03-16: 20 mg via ORAL
  Filled 2022-03-15: qty 1

## 2022-03-15 NOTE — Progress Notes (Signed)
Progress Note    Kathryn [name]  TGG:[id number] DOB: Apr 21, 1933  DOA: 03/13/2022 PCP: Jerrol Banana., MD      Brief Narrative:    Medical records reviewed and are as summarized below:  Kathryn [name] is a 88 y.o. female  with medical history significant of hypertension, hyperlipidemia, diabetes mellitus, COPD not using oxygen at baseline, GERD, hypothyroidism, skin cancer, PVD, left bundle blockade, gastric ulcer, CHF with EF of 40-45%, former smoker, who presented with shortness of breath.       Assessment/Plan:   Principal Problem:   Acute respiratory failure with hypoxia (HCC) Active Problems:   COPD exacerbation (HCC)   Acute on chronic combined systolic and diastolic CHF (congestive heart failure) (HCC)   Essential hypertension   PSVT (paroxysmal supraventricular tachycardia)   Acute renal failure superimposed on stage 3a chronic kidney disease (HCC)   HLD (hyperlipidemia)   Hypothyroidism   CAD (coronary artery disease)   Obesity with body mass index of 30.0-39.9    Body mass index is 31.43 kg/m.  (Obesity)   Acute hypoxic respiratory failure: Improved.  She is tolerating room air.  Acute on chronic systolic and diastolic CHF: She had IV Lasix this morning.  Discontinue IV Lasix and resume home torsemide tomorrow.  Net fluid loss of 4.9 L thus far.  2D echo in January 2022 showed EF estimated at 40 to 70%, grade 2 diastolic dysfunction.  Monitor BMP, daily weight and urine output.  COPD exacerbation: Continue bronchodilators and empiric antibiotics.   Staph capitis isolated in aerobic and anaerobic culture bottles from 03/13/2022 at 6:10 AM.  Repeat blood cultures obtained on the same day (03/13/2022) at 7:20 AM has not shown any growth thus far.  Staph capitis in blood is likely a contaminant.  Continue empiric antibiotics for now and follow-up blood culture report tomorrow  Staph species bacteremia isolated and only 1 out of 4 bottles.  This is  likely a contaminant.  AKI on CKD stage IIIa: Creatinine has improved.     Other comorbidities include hypertension, PSVT, CAD, type II DM (recent HbA1c was 6.9), hypothyroidism, hyperlipidemia    Diet Order             Diet 2 gram sodium Room service appropriate? Yes; Fluid consistency: Thin  Diet effective now                            Consultants: None  Procedures: None    Medications:    amiodarone  100 mg Oral Daily   aspirin EC  81 mg Oral Daily   atorvastatin  40 mg Oral Daily   carvedilol  6.25 mg Oral BID WC   enoxaparin (LOVENOX) injection  40 mg Subcutaneous Q24H   gabapentin  400 mg Oral BID   insulin aspart  0-5 Units Subcutaneous QHS   insulin aspart  0-9 Units Subcutaneous TID WC   ipratropium-albuterol  3 mL Nebulization BID   levothyroxine  100 mcg Oral Q0600   [START ON 03/16/2022] torsemide  20 mg Oral Daily   Continuous Infusions:  sodium chloride Stopped (03/14/22 0905)   azithromycin Stopped (03/15/22 0749)   cefTRIAXone (ROCEPHIN)  IV Stopped (03/15/22 3500)     Anti-infectives (From admission, onward)    Start     Dose/Rate Route Frequency Ordered Stop   03/15/22 0600  cefTRIAXone (ROCEPHIN) 2 g in sodium chloride 0.9 % 100 mL IVPB  2 g 200 mL/hr over 30 Minutes Intravenous Every 24 hours 03/14/22 1107     03/14/22 0600  cefTRIAXone (ROCEPHIN) 1 g in sodium chloride 0.9 % 100 mL IVPB  Status:  Discontinued        1 g 200 mL/hr over 30 Minutes Intravenous Every 24 hours 03/13/22 0813 03/14/22 1107   03/14/22 0600  azithromycin (ZITHROMAX) 500 mg in sodium chloride 0.9 % 250 mL IVPB        500 mg 250 mL/hr over 60 Minutes Intravenous Every 24 hours 03/13/22 0813     03/13/22 0615  cefTRIAXone (ROCEPHIN) 1 g in sodium chloride 0.9 % 100 mL IVPB        1 g 200 mL/hr over 30 Minutes Intravenous  Once 03/13/22 0609 03/13/22 0912   03/13/22 0615  azithromycin (ZITHROMAX) 500 mg in sodium chloride 0.9 % 250 mL IVPB         500 mg 250 mL/hr over 60 Minutes Intravenous  Once 03/13/22 0102 03/13/22 1111              Family Communication/Anticipated D/C date and plan/Code Status   DVT prophylaxis: enoxaparin (LOVENOX) injection 40 mg Start: 03/15/22 1000     Code Status: DNR  Family Communication: None Disposition Plan: Plan to discharge home tomorrow   Status is: Inpatient Remains inpatient appropriate because: CHF exacerbation       Subjective:   Interval events noted.  She has been weaned off of oxygen.  She complains of shortness of breath with minimal exertion.  No chest pain.  Cough is better.  Objective:    Vitals:   03/15/22 0335 03/15/22 0900 03/15/22 0909 03/15/22 1220  BP: 112/89 132/64  (!) 115/59  Pulse: 74 78  83  Resp: '17 19  18  '$ Temp: 98.3 F (36.8 C) (!) 97.5 F (36.4 C)  97.6 F (36.4 C)  TempSrc: Oral     SpO2: 95% 95%  96%  Weight:   83.1 kg   Height:       No data found.   Intake/Output Summary (Last 24 hours) at 03/15/2022 1600 Last data filed at 03/15/2022 1426 Gross per 24 hour  Intake 590.47 ml  Output 3725 ml  Net -3134.53 ml   Filed Weights   03/13/22 0451 03/14/22 0118 03/15/22 0909  Weight: 80 kg 83.8 kg 83.1 kg    Exam:  GEN: NAD SKIN: Warm and dry EYES: Nonicteric ENT: MMM CV: RRR PULM: Coarse breath sounds, mild bibasilar rales ABD: soft, ND, NT, +BS CNS: AAO x 3, non focal EXT: No edema or tenderness      Data Reviewed:   I have personally reviewed following labs and imaging studies:  Labs: Labs show the following:   Basic Metabolic Panel: Recent Labs  Lab 03/13/22 0448 03/14/22 0531 03/15/22 1246  NA 144 141 140  K 4.4 3.7 4.0  CL 113* 107 101  CO2 27 29 32  GLUCOSE 194* 148* 167*  BUN 30* 21 22  CREATININE 1.53* 1.07* 1.14*  CALCIUM 8.7* 8.8* 9.4  MG  --  1.9 1.9   GFR Estimated Creatinine Clearance: 37 mL/min (A) (by C-G formula based on SCr of 1.14 mg/dL (H)). Liver Function Tests: Recent  Labs  Lab 03/13/22 0448  AST 19  ALT 12  ALKPHOS 84  BILITOT 0.5  PROT 6.8  ALBUMIN 3.3*   No results for input(s): "LIPASE", "AMYLASE" in the last 168 hours. No results for input(s): "AMMONIA" in the last  168 hours. Coagulation profile No results for input(s): "INR", "PROTIME" in the last 168 hours.  CBC: Recent Labs  Lab 03/13/22 0448 03/14/22 0531  WBC 9.0 8.3  HGB 8.7* 7.9*  HCT 32.2* 28.9*  MCV 82.8 80.7  PLT 307 237   Cardiac Enzymes: No results for input(s): "CKTOTAL", "CKMB", "CKMBINDEX", "TROPONINI" in the last 168 hours. BNP (last 3 results) No results for input(s): "PROBNP" in the last 8760 hours. CBG: Recent Labs  Lab 03/14/22 1204 03/14/22 1712 03/14/22 2158 03/15/22 0902 03/15/22 1226  GLUCAP 139* 138* 162* 203* 187*   D-Dimer: No results for input(s): "DDIMER" in the last 72 hours. Hgb A1c: No results for input(s): "HGBA1C" in the last 72 hours. Lipid Profile: No results for input(s): "CHOL", "HDL", "LDLCALC", "TRIG", "CHOLHDL", "LDLDIRECT" in the last 72 hours. Thyroid function studies: No results for input(s): "TSH", "T4TOTAL", "T3FREE", "THYROIDAB" in the last 72 hours.  Invalid input(s): "FREET3" Anemia work up: No results for input(s): "VITAMINB12", "FOLATE", "FERRITIN", "TIBC", "IRON", "RETICCTPCT" in the last 72 hours. Sepsis Labs: Recent Labs  Lab 03/13/22 0448 03/14/22 0531  WBC 9.0 8.3    Microbiology Recent Results (from the past 240 hour(s))  Resp panel by RT-PCR (RSV, Flu A&B, Covid) Anterior Nasal Swab     Status: None   Collection Time: 03/13/22  4:48 AM   Specimen: Anterior Nasal Swab  Result Value Ref Range Status   SARS Coronavirus 2 by RT PCR NEGATIVE NEGATIVE Final    Comment: (NOTE) SARS-CoV-2 target nucleic acids are NOT DETECTED.  The SARS-CoV-2 RNA is generally detectable in upper respiratory specimens during the acute phase of infection. The lowest concentration of SARS-CoV-2 viral copies this assay can  detect is 138 copies/mL. A negative result does not preclude SARS-Cov-2 infection and should not be used as the sole basis for treatment or other patient management decisions. A negative result may occur with  improper specimen collection/handling, submission of specimen other than nasopharyngeal swab, presence of viral mutation(s) within the areas targeted by this assay, and inadequate number of viral copies(<138 copies/mL). A negative result must be combined with clinical observations, patient history, and epidemiological information. The expected result is Negative.  Fact Sheet for Patients:  EntrepreneurPulse.com.au  Fact Sheet for Healthcare Providers:  IncredibleEmployment.be  This test is no t yet approved or cleared by the [place] FDA and  has been authorized for detection and/or diagnosis of SARS-CoV-2 by FDA under an Emergency Use Authorization (EUA). This EUA will remain  in effect (meaning this test can be used) for the duration of the COVID-19 declaration under Section 564(b)(1) of the Act, 21 U.S.C.section 360bbb-3(b)(1), unless the authorization is terminated  or revoked sooner.       Influenza A by PCR NEGATIVE NEGATIVE Final   Influenza B by PCR NEGATIVE NEGATIVE Final    Comment: (NOTE) The Xpert Xpress SARS-CoV-2/FLU/RSV plus assay is intended as an aid in the diagnosis of influenza from Nasopharyngeal swab specimens and should not be used as a sole basis for treatment. Nasal washings and aspirates are unacceptable for Xpert Xpress SARS-CoV-2/FLU/RSV testing.  Fact Sheet for Patients: EntrepreneurPulse.com.au  Fact Sheet for Healthcare Providers: IncredibleEmployment.be  This test is not yet approved or cleared by the [place] FDA and has been authorized for detection and/or diagnosis of SARS-CoV-2 by FDA under an Emergency Use Authorization (EUA). This EUA will remain in  effect (meaning this test can be used) for the duration of the COVID-19 declaration under Section 564(b)(1) of the  Act, 21 U.S.C. section 360bbb-3(b)(1), unless the authorization is terminated or revoked.     Resp Syncytial Virus by PCR NEGATIVE NEGATIVE Final    Comment: (NOTE) Fact Sheet for Patients: EntrepreneurPulse.com.au  Fact Sheet for Healthcare Providers: IncredibleEmployment.be  This test is not yet approved or cleared by the [place] FDA and has been authorized for detection and/or diagnosis of SARS-CoV-2 by FDA under an Emergency Use Authorization (EUA). This EUA will remain in effect (meaning this test can be used) for the duration of the COVID-19 declaration under Section 564(b)(1) of the Act, 21 U.S.C. section 360bbb-3(b)(1), unless the authorization is terminated or revoked.  Performed at [facility], [address] [place], [place] [address]   Blood culture (routine x 2)     Status: Abnormal (Preliminary result)   Collection Time: 03/13/22  6:10 AM   Specimen: Right Antecubital; Blood  Result Value Ref Range Status   Specimen Description   Final    RIGHT ANTECUBITAL Performed at [facility], [address], [place], [place] [address]    Special Requests   Final    BOTTLES DRAWN AEROBIC AND ANAEROBIC Blood Culture adequate volume Performed at [facility], [address] [place], [place] [address]    Culture  Setup Time   Final    Organism ID to follow [place] AEROBIC AND ANAEROBIC BOTTLES CRITICAL RESULT CALLED TO, READ BACK BY AND VERIFIED WITH: WALID NAZARI 03/14/22 1044 KLW    Culture (A)  Final    STAPHYLOCOCCUS CAPITIS THE SIGNIFICANCE OF ISOLATING THIS ORGANISM FROM A SINGLE SET OF BLOOD CULTURES WHEN MULTIPLE SETS ARE DRAWN IS UNCERTAIN. PLEASE NOTIFY THE MICROBIOLOGY DEPARTMENT WITHIN ONE WEEK IF SPECIATION AND SENSITIVITIES ARE REQUIRED. Performed at  [facility], [place] [address], [place], [place] [address]    Report Status PENDING  Incomplete  Blood Culture ID Panel (Reflexed)     Status: Abnormal   Collection Time: 03/13/22  6:10 AM  Result Value Ref Range Status   Enterococcus faecalis NOT DETECTED NOT DETECTED Final   Enterococcus Faecium NOT DETECTED NOT DETECTED Final   Listeria monocytogenes NOT DETECTED NOT DETECTED Final   Staphylococcus species DETECTED (A) NOT DETECTED Final    Comment: CRITICAL RESULT CALLED TO, READ BACK BY AND VERIFIED WITH: WALID NAZARI 03/14/22 1044 KLW    Staphylococcus aureus (BCID) NOT DETECTED NOT DETECTED Final   Staphylococcus epidermidis NOT DETECTED NOT DETECTED Final   Staphylococcus lugdunensis NOT DETECTED NOT DETECTED Final   Streptococcus species NOT DETECTED NOT DETECTED Final   Streptococcus agalactiae NOT DETECTED NOT DETECTED Final   Streptococcus pneumoniae NOT DETECTED NOT DETECTED Final   Streptococcus pyogenes NOT DETECTED NOT DETECTED Final   A.calcoaceticus-baumannii NOT DETECTED NOT DETECTED Final   Bacteroides fragilis NOT DETECTED NOT DETECTED Final   Enterobacterales NOT DETECTED NOT DETECTED Final   Enterobacter cloacae complex NOT DETECTED NOT DETECTED Final   Escherichia coli NOT DETECTED NOT DETECTED Final   Klebsiella aerogenes NOT DETECTED NOT DETECTED Final   Klebsiella oxytoca NOT DETECTED NOT DETECTED Final   Klebsiella pneumoniae NOT DETECTED NOT DETECTED Final   Proteus species NOT DETECTED NOT DETECTED Final   Salmonella species NOT DETECTED NOT DETECTED Final   Serratia marcescens NOT DETECTED NOT DETECTED Final   Haemophilus influenzae NOT DETECTED NOT DETECTED Final   Neisseria meningitidis NOT DETECTED NOT DETECTED Final   Pseudomonas aeruginosa NOT DETECTED NOT DETECTED Final   Stenotrophomonas maltophilia NOT DETECTED NOT  DETECTED Final   Candida albicans NOT DETECTED NOT DETECTED Final   Candida auris NOT DETECTED NOT DETECTED Final   Candida  glabrata NOT DETECTED NOT DETECTED Final   Candida krusei NOT DETECTED NOT DETECTED Final   Candida parapsilosis NOT DETECTED NOT DETECTED Final   Candida tropicalis NOT DETECTED NOT DETECTED Final   Cryptococcus neoformans/gattii NOT DETECTED NOT DETECTED Final    Comment: Performed at [facility], [address] [place], [place] [address]  Blood culture (routine x 2)     Status: None (Preliminary result)   Collection Time: 03/13/22  7:20 AM   Specimen: BLOOD  Result Value Ref Range Status   Specimen Description BLOOD BLOOD RIGHT ARM  Final   Special Requests   Final    BOTTLES DRAWN AEROBIC AND ANAEROBIC Blood Culture adequate volume   Culture   Final    NO GROWTH 2 DAYS Performed at [facility], [address], [place], [place] [address]    Report Status PENDING  Incomplete    Procedures and diagnostic studies:  No results found.             LOS: 2 days   Kariyah Baugh  [facility]   Pager on [email or web address]. If 7PM-7AM, please contact night-coverage at [email or web address]     03/15/2022, 4:00 PM

## 2022-03-15 NOTE — Progress Notes (Signed)
Physical Therapy Treatment Patient Details Name: Kathryn [name] MRN: [id number] DOB: January 10, 1934 Today's Date: 03/15/2022   History of Present Illness Pt is an 88 y/o female presented to ED on 03/13/22 for SOB. Found to be hypoxic in the ED and placed on BiPAP. Admitted for acute respiratory failure 2/2 COPD and CHF exacerbation. PMH: CHF, COPD, CAD, HTN, CKD, PVD.    PT Comments    Pt was pleasant and motivated to participate during the session and put forth good effort throughout. Pt required only min extra time and effort with bed mobility tasks and demonstrated good stability with transfers.  Pt was able to amb with a RW with slow cadence and was generally steady but did have one instance of min instability while turning that she was able to self-correct without physical assistance.  Pt did endorse feeling weaker than at her baseline but reported feeling generally better than yesterday.  Pt endorses three falls in the last 6 months secondary to tripping and general LOB and will benefit from HHPT upon discharge to safely address deficits listed in patient problem list for decreased caregiver assistance and eventual return to PLOF.     Recommendations for follow up therapy are one component of a multi-disciplinary discharge planning process, led by the attending physician.  Recommendations may be updated based on patient status, additional functional criteria and insurance authorization.  Follow Up Recommendations  Home health PT     Assistance Recommended at Discharge Intermittent Supervision/Assistance  Patient can return home with the following A little help with walking and/or transfers;Help with stairs or ramp for entrance;Assist for transportation;Assistance with cooking/housework   Equipment Recommendations  Rolling walker (2 wheels)    Recommendations for Other Services       Precautions / Restrictions Precautions Precautions: Fall Restrictions Weight Bearing Restrictions:  No     Mobility  Bed Mobility Overal bed mobility: Modified Independent             General bed mobility comments: Increased time and effort    Transfers Overall transfer level: Needs assistance Equipment used: Rolling walker (2 wheels) Transfers: Sit to/from Stand Sit to Stand: Supervision           General transfer comment: Good eccentric and concentric control and stability    Ambulation/Gait Ambulation/Gait assistance: Supervision Gait Distance (Feet): 50 Feet x 1, 10 Feet x 1 Assistive device: Rolling walker (2 wheels) Gait Pattern/deviations: Step-through pattern, Decreased step length - right, Decreased step length - left, Trunk flexed Gait velocity: decreased     General Gait Details: Slow cadence with short B step length; on instance of min instability during 180 deg turn but able to self-correct   Stairs             Wheelchair Mobility    Modified Rankin (Stroke Patients Only)       Balance Overall balance assessment: Needs assistance Sitting-balance support: No upper extremity supported, Feet supported Sitting balance-Leahy Scale: Good     Standing balance support: During functional activity, Bilateral upper extremity supported, Reliant on assistive device for balance Standing balance-Leahy Scale: Fair                              Cognition Arousal/Alertness: Awake/alert Behavior During Therapy: WFL for tasks assessed/performed Overall Cognitive Status: Within Functional Limits for tasks assessed  Exercises Total Joint Exercises Ankle Circles/Pumps: AROM, Strengthening, Both, 10 reps Quad Sets: Strengthening, Both, 10 reps Gluteal Sets: Strengthening, Both, 10 reps Heel Slides: Strengthening, Both, 5 reps Hip ABduction/ADduction: Strengthening, Both, 5 reps Long Arc Quad: Strengthening, Both, 10 reps Knee Flexion: Strengthening, Both, 10 reps Marching in  Standing: Strengthening, Both, 5 reps, Standing Other Exercises Other Exercises: HEP education for BLE APs, QS, and GS x 10 each every 1-2 hours daily    General Comments        Pertinent Vitals/Pain Pain Assessment Pain Assessment: No/denies pain    Home Living                          Prior Function            PT Goals (current goals can now be found in the care plan section) Progress towards PT goals: Progressing toward goals    Frequency    Min 2X/week      PT Plan Discharge plan needs to be updated    Co-evaluation              AM-PAC PT "6 Clicks" Mobility   Outcome Measure  Help needed turning from your back to your side while in a flat bed without using bedrails?: None Help needed moving from lying on your back to sitting on the side of a flat bed without using bedrails?: None Help needed moving to and from a bed to a chair (including a wheelchair)?: A Little Help needed standing up from a chair using your arms (e.g., wheelchair or bedside chair)?: A Little Help needed to walk in hospital room?: A Little Help needed climbing 3-5 steps with a railing? : A Little 6 Click Score: 20    End of Session Equipment Utilized During Treatment: Gait belt Activity Tolerance: Patient tolerated treatment well Patient left: with call bell/phone within reach;in chair;with chair alarm set Nurse Communication: Mobility status PT Visit Diagnosis: Unsteadiness on feet (R26.81);Muscle weakness (generalized) (M62.81);History of falling (Z91.81)     Time: 6015-6153 PT Time Calculation (min) (ACUTE ONLY): 32 min  Charges:  $Gait Training: 8-22 mins $Therapeutic Exercise: 8-22 mins                     D. Scott Deuce [place] PT, DPT 03/15/22, 5:13 PM

## 2022-03-16 DIAGNOSIS — J9601 Acute respiratory failure with hypoxia: Secondary | ICD-10-CM | POA: Diagnosis not present

## 2022-03-16 DIAGNOSIS — I5043 Acute on chronic combined systolic (congestive) and diastolic (congestive) heart failure: Secondary | ICD-10-CM | POA: Diagnosis not present

## 2022-03-16 DIAGNOSIS — J441 Chronic obstructive pulmonary disease with (acute) exacerbation: Secondary | ICD-10-CM | POA: Diagnosis not present

## 2022-03-16 LAB — BASIC METABOLIC PANEL
Anion gap: 8 (ref 5–15)
BUN: 29 mg/dL — ABNORMAL HIGH (ref 8–23)
CO2: 30 mmol/L (ref 22–32)
Calcium: 9.6 mg/dL (ref 8.9–10.3)
Chloride: 105 mmol/L (ref 98–111)
Creatinine, Ser: 1.08 mg/dL — ABNORMAL HIGH (ref 0.44–1.00)
GFR, Estimated: 50 mL/min — ABNORMAL LOW (ref >60)
Glucose, Bld: 145 mg/dL — ABNORMAL HIGH (ref 70–99)
Potassium: 4.4 mmol/L (ref 3.5–5.1)
Sodium: 143 mmol/L (ref 135–145)

## 2022-03-16 LAB — GLUCOSE, CAPILLARY: Glucose-Capillary: 136 mg/dL — ABNORMAL HIGH (ref 70–99)

## 2022-03-16 LAB — CULTURE, BLOOD (ROUTINE X 2): Special Requests: ADEQUATE

## 2022-03-16 NOTE — Discharge Summary (Signed)
Physician Discharge Summary   Patient: Kathryn [name] MRN: [id number] DOB: [birth year]  Admit date:     03/13/2022  Discharge date: 03/16/22  Discharge Physician: Jennye Boroughs   PCP: Jerrol Banana., MD   Recommendations at discharge:    Follow up with PCP in 1 week  Discharge Diagnoses: Principal Problem:   Acute respiratory failure with hypoxia ([place]) Active Problems:   COPD exacerbation (HCC)   Acute on chronic combined systolic and diastolic CHF (congestive heart failure) (HCC)   Essential hypertension   PSVT (paroxysmal supraventricular tachycardia)   Acute renal failure superimposed on stage 3a chronic kidney disease (HCC)   HLD (hyperlipidemia)   Hypothyroidism   CAD (coronary artery disease)   Obesity with body mass index of 30.0-39.9  Resolved Problems:   * No resolved hospital problems. *  Hospital Course:  Kathryn [name] is a 88 y.o. female  with medical history significant of hypertension, hyperlipidemia, diabetes mellitus, COPD not on home oxygen, GERD, hypothyroidism, skin cancer, PVD, left bundle blockade, gastric ulcer, CHF with EF of 40-45%, former smoker, who presented with shortness of breath.  She was admitted to the hospital for acute exacerbation of chronic systolic and diastolic CHF and COPD exacerbation.  She was treated with IV Lasix, bronchodilators and empiric IV antibiotics.  She was initially on BiPAP for acute hypoxic respiratory failure.  She was successfully weaned off of oxygen therapy and was able to tolerate room air without any problems.  She also had AKI which improved with diuresis.  Her condition has improved and she is deemed stable for discharge to home today.  Assessment and Plan:   Acute hypoxic respiratory failure: Resolved   Acute on chronic systolic and diastolic CHF: Resume torsemide at discharge.  2D echo in January 2022 showed EF estimated at 40 to 27%, grade 2 diastolic dysfunction.  Monitor BMP, daily weight and  urine output.   COPD exacerbation: Resolved.  Continue bronchodilators as needed     Staph capitis isolated in aerobic and anaerobic culture bottles from 03/13/2022 at 6:10 AM.  Repeat blood cultures obtained on the same day (03/13/2022) at 7:20 AM has not shown any growth thus far.  Staph capitis in blood is likely a contaminant.     AKI on CKD stage IIIa: Creatinine has improved.       Other comorbidities include hypertension, PSVT, CAD, type II DM (recent HbA1c was 6.9), hypothyroidism, hyperlipidemia            Consultants: None Procedures performed: None  Disposition: Home health Diet recommendation:  Discharge Diet Orders (From admission, onward)     Start     Ordered   03/16/22 0000  Diet - low sodium heart healthy        03/16/22 0954   03/16/22 0000  Diet Carb Modified        03/16/22 0954           Cardiac and Carb modified diet DISCHARGE MEDICATION: Allergies as of 03/16/2022       Reactions   Oxycodone Other (See Comments)   Mental status change   Prednisone    swelling, bruising.        Medication List     STOP taking these medications    pregabalin 75 MG capsule Commonly known as: LYRICA       TAKE these medications    acetaminophen 500 MG tablet Commonly known as: TYLENOL Take 1,000 mg by mouth every 6 (six) hours as  needed. Pain   albuterol (2.5 MG/3ML) 0.083% nebulizer solution Commonly known as: PROVENTIL USE 1 VIAL VIA NEBULIZER EVERY 6 HOURS AS NEEDED FOR WHEEZING OR SHORTNESS OF BREATH   amiodarone 200 MG tablet Commonly known as: PACERONE Take 0.5 tablets (100 mg total) by mouth daily.   aspirin EC 81 MG tablet Take 81 mg by mouth daily. Swallow whole.   atorvastatin 40 MG tablet Commonly known as: LIPITOR TAKE 1 TABLET(40 MG) BY MOUTH DAILY   carvedilol 6.25 MG tablet Commonly known as: COREG TAKE 1 TABLET(6.25 MG) BY MOUTH TWICE DAILY WITH A MEAL   dapagliflozin propanediol 10 MG Tabs tablet Commonly known  as: Farxiga Take 1 tablet (10 mg total) by mouth daily.   gabapentin 400 MG capsule Commonly known as: NEURONTIN Take by mouth 4 (four) times daily.   glucose blood test strip Commonly known as: Contour Next Test Check sugar once daily  DX E11.9   hydrOXYzine 10 MG tablet Commonly known as: ATARAX TAKE 1 TO 2 TABLETS(10 TO 20 MG) BY MOUTH EVERY 6 HOURS AS NEEDED   levothyroxine 100 MCG tablet Commonly known as: SYNTHROID TAKE 1 TABLET(100 MCG) BY MOUTH DAILY BEFORE BREAKFAST   lidocaine 5 % Commonly known as: LIDODERM 1 patch daily.   losartan 25 MG tablet Commonly known as: COZAAR Take 1 tablet (25 mg total) by mouth every evening.   metFORMIN 1000 MG tablet Commonly known as: GLUCOPHAGE TAKE 1 TABLET(1000 MG) BY MOUTH DAILY WITH SUPPER   omeprazole 20 MG capsule Commonly known as: PRILOSEC TAKE 1 CAPSULE(20 MG) BY MOUTH DAILY   Spiriva HandiHaler 18 MCG inhalation capsule Generic drug: tiotropium PLACE 1 CAPSULE INTO INHALER AND INHALE EVERY DAY AS NEEDED   torsemide 20 MG tablet Commonly known as: DEMADEX Take 40 mg by mouth daily as needed. What changed: Another medication with the same name was removed. Continue taking this medication, and follow the directions you see here.               Durable Medical Equipment  (From admission, onward)           Start     Ordered   03/15/22 1715  For home use only DME Walker rolling  Once       Question Answer Comment  Walker: With [place]   Patient needs a walker to treat with the following condition Difficulty walking      03/15/22 1714            Discharge Exam: Filed Weights   03/13/22 0451 03/14/22 0118 03/15/22 0909  Weight: 80 kg 83.8 kg 83.1 kg   GEN: NAD SKIN: Warm and dry EYES: No pallor or icterus ENT: MMM CV: RRR PULM: CTA B ABD: soft, obese, NT, +BS CNS: AAO x 3, non focal EXT: No edema or tenderness   Condition at discharge: good  The results of significant  diagnostics from this hospitalization (including imaging, microbiology, ancillary and laboratory) are listed below for reference.   Imaging Studies: DG Chest Port 1 View  Result Date: 03/13/2022 CLINICAL DATA:  88 year old female with shortness of breath. Smoker, COPD. EXAM: PORTABLE CHEST 1 VIEW COMPARISON:  Chest radiograph 10/04/2021 and earlier. FINDINGS: Portable AP upright view at 0511 hours. Stable lung volumes, with large AP dimension to the chest demonstrated in July. Stable cardiac size and mediastinal contours. Borderline to mild cardiomegaly. Calcified aortic atherosclerosis. Visualized tracheal air column is within normal limits. Mildly increased chronic coarse pulmonary interstitial markings at  both lung bases greater on the right. No superimposed pneumothorax, edema, effusion, or consolidation. Advanced bilateral shoulder degeneration. Stable visualized osseous structures. IMPRESSION: 1. Chronic lung disease with mildly increased bibasilar reticulonodular opacity suspicious for acute infectious exacerbation in this setting. 2. Aortic Atherosclerosis (ICD10-I70.0). Electronically Signed   By: Genevie Ann M.D.   On: 03/13/2022 05:38    Microbiology: Results for orders placed or performed during the hospital encounter of 03/13/22  Resp panel by RT-PCR (RSV, Flu A&B, Covid) Anterior Nasal Swab     Status: None   Collection Time: 03/13/22  4:48 AM   Specimen: Anterior Nasal Swab  Result Value Ref Range Status   SARS Coronavirus 2 by RT PCR NEGATIVE NEGATIVE Final    Comment: (NOTE) SARS-CoV-2 target nucleic acids are NOT DETECTED.  The SARS-CoV-2 RNA is generally detectable in upper respiratory specimens during the acute phase of infection. The lowest concentration of SARS-CoV-2 viral copies this assay can detect is 138 copies/mL. A negative result does not preclude SARS-Cov-2 infection and should not be used as the sole basis for treatment or other patient management decisions. A  negative result may occur with  improper specimen collection/handling, submission of specimen other than nasopharyngeal swab, presence of viral mutation(s) within the areas targeted by this assay, and inadequate number of viral copies(<138 copies/mL). A negative result must be combined with clinical observations, patient history, and epidemiological information. The expected result is Negative.  Fact Sheet for Patients:  EntrepreneurPulse.com.au  Fact Sheet for Healthcare Providers:  IncredibleEmployment.be  This test is no t yet approved or cleared by the [place] FDA and  has been authorized for detection and/or diagnosis of SARS-CoV-2 by FDA under an Emergency Use Authorization (EUA). This EUA will remain  in effect (meaning this test can be used) for the duration of the COVID-19 declaration under Section 564(b)(1) of the Act, 21 U.S.C.section 360bbb-3(b)(1), unless the authorization is terminated  or revoked sooner.       Influenza A by PCR NEGATIVE NEGATIVE Final   Influenza B by PCR NEGATIVE NEGATIVE Final    Comment: (NOTE) The Xpert Xpress SARS-CoV-2/FLU/RSV plus assay is intended as an aid in the diagnosis of influenza from Nasopharyngeal swab specimens and should not be used as a sole basis for treatment. Nasal washings and aspirates are unacceptable for Xpert Xpress SARS-CoV-2/FLU/RSV testing.  Fact Sheet for Patients: EntrepreneurPulse.com.au  Fact Sheet for Healthcare Providers: IncredibleEmployment.be  This test is not yet approved or cleared by the [place] FDA and has been authorized for detection and/or diagnosis of SARS-CoV-2 by FDA under an Emergency Use Authorization (EUA). This EUA will remain in effect (meaning this test can be used) for the duration of the COVID-19 declaration under Section 564(b)(1) of the Act, 21 U.S.C. section 360bbb-3(b)(1), unless the authorization  is terminated or revoked.     Resp Syncytial Virus by PCR NEGATIVE NEGATIVE Final    Comment: (NOTE) Fact Sheet for Patients: EntrepreneurPulse.com.au  Fact Sheet for Healthcare Providers: IncredibleEmployment.be  This test is not yet approved or cleared by the [place] FDA and has been authorized for detection and/or diagnosis of SARS-CoV-2 by FDA under an Emergency Use Authorization (EUA). This EUA will remain in effect (meaning this test can be used) for the duration of the COVID-19 declaration under Section 564(b)(1) of the Act, 21 U.S.C. section 360bbb-3(b)(1), unless the authorization is terminated or revoked.  Performed at [facility], [address], [place], [place] [address]   Blood culture (routine x  2)     Status: Abnormal (Preliminary result)   Collection Time: 03/13/22  6:10 AM   Specimen: Right Antecubital; Blood  Result Value Ref Range Status   Specimen Description   Final    RIGHT ANTECUBITAL Performed at [facility], [address] [place], [place] [address]    Special Requests   Final    BOTTLES DRAWN AEROBIC AND ANAEROBIC Blood Culture adequate volume Performed at [facility], [address] [place], [place] [address]    Culture  Setup Time   Final    Organism ID to follow [place] AEROBIC AND ANAEROBIC BOTTLES CRITICAL RESULT CALLED TO, READ BACK BY AND VERIFIED WITH: WALID NAZARI 03/14/22 1044 KLW    Culture (A)  Final    STAPHYLOCOCCUS CAPITIS THE SIGNIFICANCE OF ISOLATING THIS ORGANISM FROM A SINGLE SET OF BLOOD CULTURES WHEN MULTIPLE SETS ARE DRAWN IS UNCERTAIN. PLEASE NOTIFY THE MICROBIOLOGY DEPARTMENT WITHIN ONE WEEK IF SPECIATION AND SENSITIVITIES ARE REQUIRED. Performed at [facility], [place] [address], [place],  [address]    Report Status PENDING  Incomplete  Blood Culture ID Panel (Reflexed)     Status: Abnormal   Collection Time:  03/13/22  6:10 AM  Result Value Ref Range Status   Enterococcus faecalis NOT DETECTED NOT DETECTED Final   Enterococcus Faecium NOT DETECTED NOT DETECTED Final   Listeria monocytogenes NOT DETECTED NOT DETECTED Final   Staphylococcus species DETECTED (A) NOT DETECTED Final    Comment: CRITICAL RESULT CALLED TO, READ BACK BY AND VERIFIED WITH: WALID NAZARI 03/14/22 1044 KLW    Staphylococcus aureus (BCID) NOT DETECTED NOT DETECTED Final   Staphylococcus epidermidis NOT DETECTED NOT DETECTED Final   Staphylococcus lugdunensis NOT DETECTED NOT DETECTED Final   Streptococcus species NOT DETECTED NOT DETECTED Final   Streptococcus agalactiae NOT DETECTED NOT DETECTED Final   Streptococcus pneumoniae NOT DETECTED NOT DETECTED Final   Streptococcus pyogenes NOT DETECTED NOT DETECTED Final   A.calcoaceticus-baumannii NOT DETECTED NOT DETECTED Final   Bacteroides fragilis NOT DETECTED NOT DETECTED Final   Enterobacterales NOT DETECTED NOT DETECTED Final   Enterobacter cloacae complex NOT DETECTED NOT DETECTED Final   Escherichia coli NOT DETECTED NOT DETECTED Final   Klebsiella aerogenes NOT DETECTED NOT DETECTED Final   Klebsiella oxytoca NOT DETECTED NOT DETECTED Final   Klebsiella pneumoniae NOT DETECTED NOT DETECTED Final   Proteus species NOT DETECTED NOT DETECTED Final   Salmonella species NOT DETECTED NOT DETECTED Final   Serratia marcescens NOT DETECTED NOT DETECTED Final   Haemophilus influenzae NOT DETECTED NOT DETECTED Final   Neisseria meningitidis NOT DETECTED NOT DETECTED Final   Pseudomonas aeruginosa NOT DETECTED NOT DETECTED Final   Stenotrophomonas maltophilia NOT DETECTED NOT DETECTED Final   Candida albicans NOT DETECTED NOT DETECTED Final   Candida auris NOT DETECTED NOT DETECTED Final   Candida glabrata NOT DETECTED NOT DETECTED Final   Candida krusei NOT DETECTED NOT DETECTED Final   Candida parapsilosis NOT DETECTED NOT DETECTED Final   Candida tropicalis NOT  DETECTED NOT DETECTED Final   Cryptococcus neoformans/gattii NOT DETECTED NOT DETECTED Final    Comment: Performed at [facility], [address] [place],  [address]  Blood culture (routine x 2)     Status: None (Preliminary result)   Collection Time: 03/13/22  7:20 AM   Specimen: BLOOD  Result Value Ref Range Status   Specimen Description BLOOD BLOOD RIGHT ARM  Final   Special Requests  Final    BOTTLES DRAWN AEROBIC AND ANAEROBIC Blood Culture adequate volume   Culture   Final    NO GROWTH 3 DAYS Performed at [facility], [address] [place], [place] [address]    Report Status PENDING  Incomplete    Labs: CBC: Recent Labs  Lab 03/13/22 0448 03/14/22 0531  WBC 9.0 8.3  HGB 8.7* 7.9*  HCT 32.2* 28.9*  MCV 82.8 80.7  PLT 307 222   Basic Metabolic Panel: Recent Labs  Lab 03/13/22 0448 03/14/22 0531 03/15/22 1246 03/16/22 0628  NA 144 141 140 143  K 4.4 3.7 4.0 4.4  CL 113* 107 101 105  CO2 27 29 32 30  GLUCOSE 194* 148* 167* 145*  BUN 30* 21 22 29*  CREATININE 1.53* 1.07* 1.14* 1.08*  CALCIUM 8.7* 8.8* 9.4 9.6  MG  --  1.9 1.9  --    Liver Function Tests: Recent Labs  Lab 03/13/22 0448  AST 19  ALT 12  ALKPHOS 84  BILITOT 0.5  PROT 6.8  ALBUMIN 3.3*   CBG: Recent Labs  Lab 03/15/22 0902 03/15/22 1226 03/15/22 1707 03/15/22 2157 03/16/22 0730  GLUCAP 203* 187* 133* 161* 136*    Discharge time spent: greater than 30 minutes.  Signed: Jennye Boroughs, MD [facility] 03/16/2022

## 2022-03-16 NOTE — TOC Transition Note (Signed)
Transition of Care [facility]) - CM/SW Discharge Note   Patient Details  Name: DEONDRIA PURYEAR MRN: [id number] Date of Birth: 1933/03/31  [facility] CM/SW Contact:  Laurena Slimmer, RN Phone Number: 03/16/2022, 12:25 PM   Clinical Narrative:    Discussed discharge plan with patient and her son at the bedside. Patient is agreeable to [facility]. She does not have a choice. Her son will transport her home.   Referral sent and accepted by Floydene Flock of [facility].   TOC signing off.          Patient Goals and CMS Choice      Discharge Placement                         Discharge Plan and Services Additional resources added to the After Visit Summary for                                       Social Determinants of Health (SDOH) Interventions SDOH Screenings   Food Insecurity: No Food Insecurity (03/14/2022)  Housing: Low Risk  (03/14/2022)  Transportation Needs: No Transportation Needs (03/14/2022)  Utilities: Not At Risk (03/14/2022)  Alcohol Screen: Low Risk  (11/16/2021)  Depression (PHQ2-9): Medium Risk (11/16/2021)  Tobacco Use: Medium Risk (03/13/2022)     Readmission Risk Interventions    05/27/2020   12:12 PM  Readmission Risk Prevention Plan  Transportation Screening Complete  PCP or Specialist Appt within 5-7 Days Complete  Home Care Screening Complete  Medication Review (RN CM) Complete

## 2022-03-16 NOTE — Care Management Important Message (Signed)
Important Message  Patient Details  Name: Kathryn [name] MRN: [id number] Date of Birth: [birth year]   Medicare Important Message Given:  Yes  I reviewed the Important Message from Medicare with the patient's HCPOA, [name] by phone [phone]) and he is in agreement with the discharge. I thanked him for his time and wished her a speedy recovery.    [name] 03/16/2022, 10:52 AM

## 2022-03-18 LAB — CULTURE, BLOOD (ROUTINE X 2)
Culture: NO GROWTH
Special Requests: ADEQUATE

## 2022-03-20 ENCOUNTER — Telehealth: Payer: Self-pay | Admitting: *Deleted

## 2022-03-20 ENCOUNTER — Telehealth: Payer: Self-pay | Admitting: Family Medicine

## 2022-03-20 DIAGNOSIS — I890 Lymphedema, not elsewhere classified: Secondary | ICD-10-CM | POA: Insufficient documentation

## 2022-03-20 NOTE — Patient Outreach (Signed)
  Care Coordination [facility] Note Transition Care Management Follow-up Telephone Call Date of discharge and from where: [facility] [phone] How have you been since you were released from the hospital? I am doing good Any questions or concerns? No  Items Reviewed: Did the pt receive and understand the discharge instructions provided? Yes  Medications obtained and verified? Yes  Other? Yes  Patient did not understand that she was to stop taking the lyrica Any new allergies since your discharge? No  Dietary orders reviewed? No Do you have support at home? Yes   Home Care and Equipment/Supplies: Were home health services ordered? yes If so, what is the name of the agency? [facility] Has the agency set up a time to come to the patient's home? no Were any new equipment or medical supplies ordered?  No What is the name of the medical supply agency? N  Were you able to get the supplies/equipment? N/a Do you have any questions related to the use of the equipment or supplies? No  Functional Questionnaire: (I = Independent and D = Dependent) ADLs: I  Bathing/Dressing- I  Meal Prep- D  Eating- I  Maintaining continence- I  Transferring/Ambulation- I  Managing Meds- I  Follow up appointments reviewed:  PCP Hospital f/u appt confirmed? No  Patient refused RN to schedule. Per patient she will schedule herself around her other appointments. [facility] f/u appt confirmed? No   Are transportation arrangements needed? No  If their condition worsens, is the pt aware to call PCP or go to the Emergency Dept.? Yes Was the patient provided with contact information for the PCP's office or ED? Yes Was to pt encouraged to call back with questions or concerns? Yes  SDOH assessments and interventions completed:   Yes SDOH Screenings   Food Insecurity: No Food Insecurity (03/20/2022)  Housing: Low Risk  (03/20/2022)  Transportation Needs: No Transportation Needs (03/20/2022)  Utilities: Not At  Risk (03/20/2022)  Alcohol Screen: Low Risk  (11/16/2021)  Depression (PHQ2-9): Medium Risk (11/16/2021)  Tobacco Use: Medium Risk (03/13/2022)      Care Coordination Interventions:  No Care Coordination interventions needed at this time.   Encounter Outcome:  Pt. Visit Completed   [facility] [phone]

## 2022-03-20 NOTE — Telephone Encounter (Signed)
Tiffany with [facility] is calling to report received referral PT. Calling to report that the pt is declining start of services until the 03/28/2022 Cb- [id number] option 2

## 2022-03-20 NOTE — Progress Notes (Signed)
MRN : [id number]  Kathryn [name] is a 88 y.o. (03/14/1934) female who presents with chief complaint of legs swell.  History of Present Illness:   The patient returns to the office for followup evaluation regarding leg swelling.  The swelling has improved quite a bit and the pain associated with swelling has decreased substantially. There have not been any interval development of a ulcerations or wounds.  Since the previous visit the patient has been wearing graduated compression stockings and has noted some improvement in the lymphedema. The patient has been using compression routinely morning until night.  The patient also states elevation during the day and exercise (such as walking) is being done too.  Duplex ultrasound of the bilateral lower extremity venous system demonstrates a widely patent deep venous system no evidence of superficial reflux.  ABI's dated 01/24/2022 Rt=0.66 and Lt=0.82 (biphasic signals bilaterally)   12/12/2021  BUN/Cr 20/1.02  GFR 54  No outpatient medications have been marked as taking for the 03/29/22 encounter (Appointment) with Delana Meyer, Dolores Lory, MD.    Past Medical History:  Diagnosis Date   Actinic keratosis    Aortic atherosclerosis ([place]) 05/25/2020   Arthritis    spinal stenosis   Cardiomyopathy - presumed to be nonischemic    a. 03/2020 Echo: EF 40-45%, gr2 DD. Nl RV fxn. Mildly dil LA. Mild MR/ao sclerosis; b. 03/2020 MV: EF 30-44%, no ischemia. Cor Ca2+ in pLAD.   CHF (congestive heart failure) (HCC)    COPD (chronic obstructive pulmonary disease) (HCC)    Coronary artery calcification seen on CT scan    a. 03/2020 MV: CT attenuation images show Ao and mod pLAD Ca2+.   Diabetes mellitus without complication ([place])    Gastric ulcer 06/2010   treated with Prolisec- states no problems now   Hyperlipidemia    Hypertension    PCP Dr Miguel Aschoff   [place]   Hypothyroidism    Left bundle branch block (LBBB)    Neuromuscular disorder  (HCC)    slight numbness right toes- comes and goes   Peripheral vascular disease (HCC)    varicose veins left leg   Pneumonia    PSVT (paroxysmal supraventricular tachycardia)    a. 03/2020 Zio: Avg HR 87 (66-211).  4 beats NSVT.  43 PSVT episodes, longest 10h 47m@ avg of 153 bpm (max 211).  Seen by EP-->amio started (pt wished to avoid EPS/RFCA).   Seasonal allergies    Shortness of breath    Skin cancer    multiple from face   Squamous cell carcinoma of skin 03/23/2013   L dorsal hand   Squamous cell carcinoma of skin 02/04/2014   R forearm/in situ, L pretibial   Squamous cell carcinoma of skin 09/24/2018   R thumb   Squamous cell carcinoma of skin 09/08/2019   L forearm - ED&C   Squamous cell carcinoma of skin 09/08/2019   R dosal hand - ED&C   Squamous cell carcinoma of skin 06/20/2021   R cheek preauricular, excised    Past Surgical History:  Procedure Laterality Date   BACK SURGERY     4/12  Dr AShellia Carwin for lumbar stenosis   BREAST CYST ASPIRATION Left 1965   negative   [place]  lumpectomy with biopsy   left   CARPAL TUNNEL RELEASE     right   COLONOSCOPY     COLONOSCOPY WITH PROPOFOL N/A 06/30/2015   Procedure: COLONOSCOPY WITH PROPOFOL;  Surgeon:  Hulen Luster, MD;  Location: [facility] ENDOSCOPY;  Service: Gastroenterology;  Laterality: N/A;   COLONOSCOPY WITH PROPOFOL N/A 01/22/2017   Procedure: COLONOSCOPY WITH PROPOFOL;  Surgeon: Lollie Sails, MD;  Location: [facility] ENDOSCOPY;  Service: Endoscopy;  Laterality: N/A;   COLONOSCOPY WITH PROPOFOL N/A 01/28/2019   Procedure: COLONOSCOPY WITH PROPOFOL;  Surgeon: Toledo, Benay Pike, MD;  Location: [facility] ENDOSCOPY;  Service: Gastroenterology;  Laterality: N/A;   ESOPHAGOGASTRODUODENOSCOPY     ESOPHAGOGASTRODUODENOSCOPY (EGD) WITH PROPOFOL N/A 01/28/2019   Procedure: ESOPHAGOGASTRODUODENOSCOPY (EGD) WITH PROPOFOL;  Surgeon: Toledo, Benay Pike, MD;  Location: [facility] ENDOSCOPY;  Service: Gastroenterology;  Laterality:  N/A;   EYE SURGERY     cataract extraction with IOL bilaterally   JOINT REPLACEMENT     LUMBAR LAMINECTOMY/DECOMPRESSION MICRODISCECTOMY  05/24/2011   Procedure: LUMBAR LAMINECTOMY/DECOMPRESSION MICRODISCECTOMY 2 LEVELS;  Surgeon: Magnus Sinning, MD;  Location: [facility] ORS;  Service: Orthopedics;  Laterality: N/A;  L2-L3, L3-L4 (x-ray)   RIGHT OOPHORECTOMY     due to STAPH INFECTION    Social History Social History   Tobacco Use   Smoking status: Former    Packs/day: 0.50    Years: 50.00    Total pack years: 25.00    Types: Cigarettes    Quit date: 05/16/2004    Years since quitting: 17.8   Smokeless tobacco: Never  Vaping Use   Vaping Use: Never used  Substance Use Topics   Alcohol use: Yes    Comment: socially-1 glass of wine or [place] icea tea once a month   Drug use: No    Family History Family History  Problem Relation Age of Onset   Dementia Mother    Lung cancer Father    Heart disease Father    Hypertension Sister    Breast cancer Maternal Aunt    Arthritis Son     Allergies  Allergen Reactions   Oxycodone Other (See Comments)    Mental status change   Prednisone     swelling, bruising.     REVIEW OF SYSTEMS (Negative unless checked)  Constitutional: '[]'$ Weight loss  '[]'$ Fever  '[]'$ Chills Cardiac: '[]'$ Chest pain   '[]'$ Chest pressure   '[]'$ Palpitations   '[]'$ Shortness of breath when laying flat   '[]'$ Shortness of breath with exertion. Vascular:  '[]'$ Pain in legs with walking   '[x]'$ Pain in legs with standing  '[]'$ History of DVT   '[]'$ Phlebitis   '[x]'$ Swelling in legs   '[]'$ Varicose veins   '[]'$ Non-healing ulcers Pulmonary:   '[]'$ Uses home oxygen   '[]'$ Productive cough   '[]'$ Hemoptysis   '[]'$ Wheeze  '[]'$ COPD   '[]'$ Asthma Neurologic:  '[]'$ Dizziness   '[]'$ Seizures   '[]'$ History of stroke   '[]'$ History of TIA  '[]'$ Aphasia   '[]'$ Vissual changes   '[]'$ Weakness or numbness in arm   '[]'$ Weakness or numbness in leg Musculoskeletal:   '[]'$ Joint swelling   '[]'$ Joint pain   '[]'$ Low back pain Hematologic:  '[]'$ Easy bruising   '[]'$ Easy bleeding   '[]'$ Hypercoagulable state   '[]'$ Anemic Gastrointestinal:  '[]'$ Diarrhea   '[]'$ Vomiting  '[]'$ Gastroesophageal reflux/heartburn   '[]'$ Difficulty swallowing. Genitourinary:  '[]'$ Chronic kidney disease   '[]'$ Difficult urination  '[]'$ Frequent urination   '[]'$ Blood in urine Skin:  '[]'$ Rashes   '[]'$ Ulcers  Psychological:  '[]'$ History of anxiety   '[]'$  History of major depression.  Physical Examination  There were no vitals filed for this visit. There is no height or weight on file to calculate BMI. Gen: WD/WN, NAD Head: [place]/AT, No temporalis wasting.  Ear/Nose/Throat: Hearing grossly intact, nares w/o erythema or drainage, pinna without  lesions Eyes: PER, EOMI, sclera nonicteric.  Neck: Supple, no gross masses.  No JVD.  Pulmonary:  Good air movement, no audible wheezing, no use of accessory muscles.  Cardiac: RRR, precordium not hyperdynamic. Vascular:  scattered varicosities present bilaterally.  Mild venous stasis changes to the legs bilaterally.  2-3+ soft pitting edema, CEAP C4sEpAsPr  Vessel Right Left  Radial Palpable Palpable  Gastrointestinal: soft, non-distended. No guarding/no peritoneal signs.  Musculoskeletal: M/S 5/5 throughout.  No deformity.  Neurologic: CN 2-12 intact. Pain and light touch intact in extremities.  Symmetrical.  Speech is fluent. Motor exam as listed above. Psychiatric: Judgment intact, Mood & affect appropriate for pt's clinical situation. Dermatologic: Venous rashes no ulcers noted.  No changes consistent with cellulitis. Lymph : No lichenification or skin changes of chronic lymphedema.  CBC Lab Results  Component Value Date   WBC 8.3 03/14/2022   HGB 7.9 (L) 03/14/2022   HCT 28.9 (L) 03/14/2022   MCV 80.7 03/14/2022   PLT 237 03/14/2022    BMET    Component Value Date/Time   NA 143 03/16/2022 0628   NA 143 12/12/2021 1116   NA 136 07/09/2014 0529   K 4.4 03/16/2022 0628   K 4.6 07/09/2014 0529   CL 105 03/16/2022 0628   CL 104 07/09/2014 0529   CO2 30  03/16/2022 0628   CO2 28 07/09/2014 0529   GLUCOSE 145 (H) 03/16/2022 0628   GLUCOSE 169 (H) 07/09/2014 0529   BUN 29 (H) 03/16/2022 0628   BUN 20 12/12/2021 1116   BUN 20 07/09/2014 0529   CREATININE 1.08 (H) 03/16/2022 0628   CREATININE 0.85 07/09/2014 0529   CALCIUM 9.6 03/16/2022 0628   CALCIUM 8.6 (L) 07/09/2014 0529   GFRNONAA 50 (L) 03/16/2022 0628   GFRNONAA >60 07/09/2014 0529   GFRAA 84 07/29/2019 0936   GFRAA >60 07/09/2014 0529   Estimated Creatinine Clearance: 39 mL/min (A) (by C-G formula based on SCr of 1.08 mg/dL (H)).  COAG Lab Results  Component Value Date   INR 1.0 05/24/2020   INR 1.0 06/23/2014   INR 0.9 12/11/2012    Radiology DG Chest Port 1 View  Result Date: 03/13/2022 CLINICAL DATA:  88 year old female with shortness of breath. Smoker, COPD. EXAM: PORTABLE CHEST 1 VIEW COMPARISON:  Chest radiograph 10/04/2021 and earlier. FINDINGS: Portable AP upright view at 0511 hours. Stable lung volumes, with large AP dimension to the chest demonstrated in July. Stable cardiac size and mediastinal contours. Borderline to mild cardiomegaly. Calcified aortic atherosclerosis. Visualized tracheal air column is within normal limits. Mildly increased chronic coarse pulmonary interstitial markings at both lung bases greater on the right. No superimposed pneumothorax, edema, effusion, or consolidation. Advanced bilateral shoulder degeneration. Stable visualized osseous structures. IMPRESSION: 1. Chronic lung disease with mildly increased bibasilar reticulonodular opacity suspicious for acute infectious exacerbation in this setting. 2. Aortic Atherosclerosis (ICD10-I70.0). Electronically Signed   By: Genevie Ann M.D.   On: 03/13/2022 05:38     Assessment/Plan 1. Lymphedema Recommend:  No surgery or intervention at this point in time.    I have reviewed my discussion with the patient regarding lymphedema and why it  causes symptoms.  Patient will continue wearing graduated  compression on a daily basis as this seems to have worked well for her. The patient should put the compression on first thing in the morning and removing them in the evening. The patient should not sleep in the compression.   In addition, behavioral modification throughout the day  will be continued.  This will include frequent elevation (such as in a recliner), use of over the counter pain medications as needed and exercise such as walking.  The systemic causes for chronic edema such as liver, kidney and cardiac etiologies does not appear to have significant changed over the past year.    The patient does not wish to move forward with a lymphedema pump at this time as she feels she is doing relatively well.  She does wish to consider the possibility of a lymph edema pump in the future.  The patient will follow-up with me in 6 months.   2. Atherosclerosis of native arteries of the extremities with ulceration ([place])  Recommend:  The patient has evidence of atherosclerosis of the lower extremities with claudication.  The patient does not voice lifestyle limiting changes at this point in time.    No invasive studies, angiography or surgery at this time.   The patient should continue walking and begin a more formal exercise program.  The patient should continue antiplatelet therapy and aggressive treatment of the lipid abnormalities  No changes in the patient's medications at this time  Continued surveillance is indicated as atherosclerosis is likely to progress with time.    The patient will continue follow up with noninvasive studies as ordered.   3. Essential hypertension Continue antihypertensive medications as already ordered, these medications have been reviewed and there are no changes at this time.  4. Coronary artery disease involving native coronary artery of native heart with angina pectoris ([place]) Continue cardiac and antihypertensive medications as already ordered and reviewed, no  changes at this time.  Continue statin as ordered and reviewed, no changes at this time  Nitrates PRN for chest pain  5. Type 2 diabetes mellitus without complication, without long-term current use of insulin (HCC) Continue hypoglycemic medications as already ordered, these medications have been reviewed and there are no changes at this time.  Hgb A1C to be monitored as already arranged by primary service    Hortencia Pilar, MD  03/20/2022 4:22 PM

## 2022-03-21 ENCOUNTER — Encounter: Payer: Self-pay | Admitting: Emergency Medicine

## 2022-03-21 ENCOUNTER — Emergency Department: Payer: Medicare Other

## 2022-03-21 ENCOUNTER — Inpatient Hospital Stay
Admission: EM | Admit: 2022-03-21 | Discharge: 2022-03-24 | DRG: 291 | Disposition: A | Discharge status: Home / Self Care | Payer: Medicare Other | Attending: Internal Medicine | Admitting: Internal Medicine

## 2022-03-21 DIAGNOSIS — J9601 Acute respiratory failure with hypoxia: Secondary | ICD-10-CM | POA: Diagnosis present

## 2022-03-21 DIAGNOSIS — I10 Essential (primary) hypertension: Secondary | ICD-10-CM | POA: Diagnosis not present

## 2022-03-21 DIAGNOSIS — I509 Heart failure, unspecified: Secondary | ICD-10-CM

## 2022-03-21 DIAGNOSIS — Z8249 Family history of ischemic heart disease and other diseases of the circulatory system: Secondary | ICD-10-CM

## 2022-03-21 DIAGNOSIS — J811 Chronic pulmonary edema: Secondary | ICD-10-CM | POA: Diagnosis not present

## 2022-03-21 DIAGNOSIS — Z9181 History of falling: Secondary | ICD-10-CM

## 2022-03-21 DIAGNOSIS — I5043 Acute on chronic combined systolic (congestive) and diastolic (congestive) heart failure: Secondary | ICD-10-CM | POA: Diagnosis present

## 2022-03-21 DIAGNOSIS — I130 Hypertensive heart and chronic kidney disease with heart failure and stage 1 through stage 4 chronic kidney disease, or unspecified chronic kidney disease: Principal | ICD-10-CM | POA: Diagnosis present

## 2022-03-21 DIAGNOSIS — Z79890 Hormone replacement therapy (postmenopausal): Secondary | ICD-10-CM

## 2022-03-21 DIAGNOSIS — E785 Hyperlipidemia, unspecified: Secondary | ICD-10-CM | POA: Insufficient documentation

## 2022-03-21 DIAGNOSIS — Z6831 Body mass index (BMI) 31.0-31.9, adult: Secondary | ICD-10-CM

## 2022-03-21 DIAGNOSIS — E1142 Type 2 diabetes mellitus with diabetic polyneuropathy: Secondary | ICD-10-CM | POA: Diagnosis present

## 2022-03-21 DIAGNOSIS — R0902 Hypoxemia: Secondary | ICD-10-CM | POA: Diagnosis not present

## 2022-03-21 DIAGNOSIS — Z7984 Long term (current) use of oral hypoglycemic drugs: Secondary | ICD-10-CM

## 2022-03-21 DIAGNOSIS — J80 Acute respiratory distress syndrome: Secondary | ICD-10-CM | POA: Diagnosis not present

## 2022-03-21 DIAGNOSIS — Z85828 Personal history of other malignant neoplasm of skin: Secondary | ICD-10-CM | POA: Diagnosis not present

## 2022-03-21 DIAGNOSIS — I428 Other cardiomyopathies: Secondary | ICD-10-CM | POA: Diagnosis present

## 2022-03-21 DIAGNOSIS — K219 Gastro-esophageal reflux disease without esophagitis: Secondary | ICD-10-CM | POA: Diagnosis not present

## 2022-03-21 DIAGNOSIS — Z79899 Other long term (current) drug therapy: Secondary | ICD-10-CM

## 2022-03-21 DIAGNOSIS — R0602 Shortness of breath: Secondary | ICD-10-CM | POA: Diagnosis not present

## 2022-03-21 DIAGNOSIS — Z7982 Long term (current) use of aspirin: Secondary | ICD-10-CM

## 2022-03-21 DIAGNOSIS — J441 Chronic obstructive pulmonary disease with (acute) exacerbation: Secondary | ICD-10-CM | POA: Diagnosis not present

## 2022-03-21 DIAGNOSIS — Z1152 Encounter for screening for COVID-19: Secondary | ICD-10-CM

## 2022-03-21 DIAGNOSIS — Z66 Do not resuscitate: Secondary | ICD-10-CM | POA: Diagnosis present

## 2022-03-21 DIAGNOSIS — E669 Obesity, unspecified: Secondary | ICD-10-CM | POA: Diagnosis not present

## 2022-03-21 DIAGNOSIS — E039 Hypothyroidism, unspecified: Secondary | ICD-10-CM | POA: Diagnosis present

## 2022-03-21 DIAGNOSIS — R0689 Other abnormalities of breathing: Secondary | ICD-10-CM | POA: Diagnosis not present

## 2022-03-21 DIAGNOSIS — I2510 Atherosclerotic heart disease of native coronary artery without angina pectoris: Secondary | ICD-10-CM | POA: Diagnosis present

## 2022-03-21 DIAGNOSIS — Z87891 Personal history of nicotine dependence: Secondary | ICD-10-CM | POA: Diagnosis not present

## 2022-03-21 DIAGNOSIS — K279 Peptic ulcer, site unspecified, unspecified as acute or chronic, without hemorrhage or perforation: Secondary | ICD-10-CM | POA: Diagnosis not present

## 2022-03-21 DIAGNOSIS — Z885 Allergy status to narcotic agent status: Secondary | ICD-10-CM

## 2022-03-21 DIAGNOSIS — Z888 Allergy status to other drugs, medicaments and biological substances status: Secondary | ICD-10-CM

## 2022-03-21 DIAGNOSIS — I110 Hypertensive heart disease with heart failure: Secondary | ICD-10-CM | POA: Diagnosis not present

## 2022-03-21 DIAGNOSIS — N1831 Chronic kidney disease, stage 3a: Secondary | ICD-10-CM | POA: Diagnosis present

## 2022-03-21 DIAGNOSIS — E1122 Type 2 diabetes mellitus with diabetic chronic kidney disease: Secondary | ICD-10-CM | POA: Diagnosis present

## 2022-03-21 DIAGNOSIS — I480 Paroxysmal atrial fibrillation: Secondary | ICD-10-CM | POA: Insufficient documentation

## 2022-03-21 DIAGNOSIS — Z801 Family history of malignant neoplasm of trachea, bronchus and lung: Secondary | ICD-10-CM

## 2022-03-21 DIAGNOSIS — E1151 Type 2 diabetes mellitus with diabetic peripheral angiopathy without gangrene: Secondary | ICD-10-CM | POA: Diagnosis not present

## 2022-03-21 DIAGNOSIS — I447 Left bundle-branch block, unspecified: Secondary | ICD-10-CM | POA: Diagnosis not present

## 2022-03-21 LAB — BLOOD GAS, VENOUS
Acid-Base Excess: 0.5 mmol/L (ref 0.0–2.0)
Bicarbonate: 28.4 mmol/L — ABNORMAL HIGH (ref 20.0–28.0)
Delivery systems: POSITIVE
FIO2: 40 %
O2 Saturation: 48.5 %
Patient temperature: 37
pCO2, Ven: 59 mmHg (ref 44–60)
pH, Ven: 7.29 (ref 7.25–7.43)
pO2, Ven: 39 mmHg (ref 32–45)

## 2022-03-21 LAB — COMPREHENSIVE METABOLIC PANEL
ALT: 16 U/L (ref 0–44)
AST: 21 U/L (ref 15–41)
Albumin: 3.7 g/dL (ref 3.5–5.0)
Alkaline Phosphatase: 93 U/L (ref 38–126)
Anion gap: 6 (ref 5–15)
BUN: 30 mg/dL — ABNORMAL HIGH (ref 8–23)
CO2: 23 mmol/L (ref 22–32)
Calcium: 9.6 mg/dL (ref 8.9–10.3)
Chloride: 114 mmol/L — ABNORMAL HIGH (ref 98–111)
Creatinine, Ser: 1.07 mg/dL — ABNORMAL HIGH (ref 0.44–1.00)
GFR, Estimated: 51 mL/min — ABNORMAL LOW (ref >60)
Glucose, Bld: 173 mg/dL — ABNORMAL HIGH (ref 70–99)
Potassium: 4.5 mmol/L (ref 3.5–5.1)
Sodium: 143 mmol/L (ref 135–145)
Total Bilirubin: 0.5 mg/dL (ref 0.3–1.2)
Total Protein: 7.4 g/dL (ref 6.5–8.1)

## 2022-03-21 LAB — CBC WITH DIFFERENTIAL/PLATELET
Abs Immature Granulocytes: 0.05 10*3/uL (ref 0.00–0.07)
Basophils Absolute: 0.1 10*3/uL (ref 0.0–0.1)
Basophils Relative: 1 %
Eosinophils Absolute: 0.4 10*3/uL (ref 0.0–0.5)
Eosinophils Relative: 6 %
HCT: 33.3 % — ABNORMAL LOW (ref 36.0–46.0)
Hemoglobin: 8.9 g/dL — ABNORMAL LOW (ref 12.0–15.0)
Immature Granulocytes: 1 %
Lymphocytes Relative: 26 %
Lymphs Abs: 2 10*3/uL (ref 0.7–4.0)
MCH: 22.1 pg — ABNORMAL LOW (ref 26.0–34.0)
MCHC: 26.7 g/dL — ABNORMAL LOW (ref 30.0–36.0)
MCV: 82.6 fL (ref 80.0–100.0)
Monocytes Absolute: 0.7 10*3/uL (ref 0.1–1.0)
Monocytes Relative: 9 %
Neutro Abs: 4.4 10*3/uL (ref 1.7–7.7)
Neutrophils Relative %: 57 %
Platelets: 336 10*3/uL (ref 150–400)
RBC: 4.03 MIL/uL (ref 3.87–5.11)
RDW: 18.3 % — ABNORMAL HIGH (ref 11.5–15.5)
WBC: 7.6 10*3/uL (ref 4.0–10.5)
nRBC: 0 % (ref 0.0–0.2)

## 2022-03-21 LAB — RESP PANEL BY RT-PCR (RSV, FLU A&B, COVID)  RVPGX2
Influenza A by PCR: NEGATIVE
Influenza B by PCR: NEGATIVE
Resp Syncytial Virus by PCR: NEGATIVE
SARS Coronavirus 2 by RT PCR: NEGATIVE

## 2022-03-21 LAB — BRAIN NATRIURETIC PEPTIDE: B Natriuretic Peptide: 676.4 pg/mL — ABNORMAL HIGH (ref 0.0–100.0)

## 2022-03-21 LAB — TROPONIN I (HIGH SENSITIVITY): Troponin I (High Sensitivity): 9 ng/L (ref <18)

## 2022-03-21 LAB — LACTIC ACID, PLASMA: Lactic Acid, Venous: 1.7 mmol/L (ref 0.5–1.9)

## 2022-03-21 MED ORDER — FUROSEMIDE 10 MG/ML IJ SOLN
40.0000 mg | Freq: Once | INTRAMUSCULAR | Status: AC
  Administered 2022-03-21: 40 mg via INTRAVENOUS
  Filled 2022-03-21: qty 4

## 2022-03-21 MED ORDER — IPRATROPIUM-ALBUTEROL 0.5-2.5 (3) MG/3ML IN SOLN
9.0000 mL | Freq: Once | RESPIRATORY_TRACT | Status: AC
  Administered 2022-03-21: 9 mL via RESPIRATORY_TRACT
  Filled 2022-03-21: qty 3

## 2022-03-21 MED ORDER — METHYLPREDNISOLONE SODIUM SUCC 125 MG IJ SOLR
125.0000 mg | Freq: Once | INTRAMUSCULAR | Status: AC
  Administered 2022-03-21: 125 mg via INTRAVENOUS
  Filled 2022-03-21: qty 2

## 2022-03-21 NOTE — ED Provider Notes (Signed)
[facility] Provider Note    Event Date/Time   First MD Initiated Contact with Patient 03/21/22 2221     (approximate)   History   Chief Complaint Respiratory Distress   HPI  Kathryn [name] is a 88 y.o. female with past medical history of hypertension, hyperlipidemia, diabetes, COPD, CHF, and CKD who presents to the ED complaining of shortness of breath.  Patient reports that she has been feeling better following recent mission to the hospital for COPD and CHF exacerbation, but has had increasing difficulty breathing over the course of the day today.  She denies any pain in her chest and has not noticed any fever or cough.  She states that her legs stay swollen but has not noticed any worsening swelling.  EMS reports that they found patient to be in respiratory distress, with oxygen saturations in the mid 70s.  She was placed on a nonrebreather by the fire department, subsequently transitioned to CPAP by EMS.  1.5 inches of Nitropaste was applied to her chest prior to arrival.     Physical Exam   Triage Vital Signs: ED Triage Vitals [03/21/22 2218]  Enc Vitals Group     BP      Pulse      Resp      Temp      Temp src      SpO2 (!) 70 %     Weight      Height      Head Circumference      Peak Flow      Pain Score      Pain Loc      Pain Edu?      Excl. in [place]?     Most recent vital signs: Vitals:   03/21/22 2236 03/21/22 2300  BP:  (!) 147/64  Pulse: 89 80  Resp:  (!) 23  SpO2: 100% 100%    Constitutional: Alert and oriented. Eyes: Conjunctivae are normal. Head: Atraumatic. Nose: No congestion/rhinnorhea. Mouth/Throat: Mucous membranes are moist.  Cardiovascular: Normal rate, regular rhythm. Grossly normal heart sounds.  2+ radial pulses bilaterally. Respiratory: Tachypneic with increased respiratory effort and accessory muscle use.  Very poor air movement throughout with faint end expiratory wheezing. Gastrointestinal: Soft and  nontender. No distention. Musculoskeletal: No lower extremity tenderness nor edema.  Neurologic:  Normal speech and language. No gross focal neurologic deficits are appreciated.    ED Results / Procedures / Treatments   Labs (all labs ordered are listed, but only abnormal results are displayed) Labs Reviewed  CBC WITH DIFFERENTIAL/PLATELET - Abnormal; Notable for the following components:      Result Value   Hemoglobin 8.9 (*)    HCT 33.3 (*)    MCH 22.1 (*)    MCHC 26.7 (*)    RDW 18.3 (*)    All other components within normal limits  COMPREHENSIVE METABOLIC PANEL - Abnormal; Notable for the following components:   Chloride 114 (*)    Glucose, Bld 173 (*)    BUN 30 (*)    Creatinine, Ser 1.07 (*)    GFR, Estimated 51 (*)    All other components within normal limits  BRAIN NATRIURETIC PEPTIDE - Abnormal; Notable for the following components:   B Natriuretic Peptide 676.4 (*)    All other components within normal limits  BLOOD GAS, VENOUS - Abnormal; Notable for the following components:   Bicarbonate 28.4 (*)    All other components within normal limits  RESP PANEL BY RT-PCR (RSV, FLU A&B, COVID)  [id number]  LACTIC ACID, PLASMA  LACTIC ACID, PLASMA  TROPONIN I (HIGH SENSITIVITY)     EKG  ED ECG REPORT I, Blake Divine, the attending physician, personally viewed and interpreted this ECG.   Date: 03/21/2022  EKG Time: 22:22  Rate: 84  Rhythm: normal sinus rhythm  Axis: Normal  Intervals:left bundle branch block  ST&T Change: None  RADIOLOGY Chest x-ray reviewed and interpreted by me with multifocal infiltrates consistent with pulmonary edema, no effusion noted.  PROCEDURES:  Critical Care performed: Yes, see critical care procedure note(s)  .Critical Care  Performed by: Blake Divine, MD Authorized by: Blake Divine, MD   Critical care provider statement:    Critical care time (minutes):  30   Critical care time was exclusive of:  Separately billable  procedures and treating other patients and teaching time   Critical care was necessary to treat or prevent imminent or life-threatening deterioration of the following conditions:  Respiratory failure   Critical care was time spent personally by me on the following activities:  Development of treatment plan with patient or surrogate, discussions with consultants, evaluation of patient's response to treatment, examination of patient, ordering and review of laboratory studies, ordering and review of radiographic studies, ordering and performing treatments and interventions, pulse oximetry, re-evaluation of patient's condition and review of old charts   I assumed direction of critical care for this patient from another provider in my specialty: no     Care discussed with: admitting provider      [name]: Medications  methylPREDNISolone sodium succinate (SOLU-MEDROL) 125 mg/2 mL injection 125 mg (125 mg Intravenous Given 03/21/22 2234)  ipratropium-albuterol (DUONEB) 0.5-2.5 (3) MG/3ML nebulizer solution 9 mL (9 mLs Nebulization Given 03/21/22 2236)  furosemide (LASIX) injection 40 mg (40 mg Intravenous Given 03/21/22 2328)     IMPRESSION / MDM / [place] / ED COURSE  I reviewed the triage vital signs and the nursing notes.                              88 y.o. female with past medical history of hypertension, hyperlipidemia, diabetes, COPD, CHF, and CKD who presents to the ED complaining of increasing difficulty breathing over the course of the day today following recent mission for COPD and CHF.  Patient's presentation is most consistent with acute presentation with potential threat to life or bodily function.  Differential diagnosis includes, but is not limited to, CHF exacerbation, COPD exacerbation, pneumonia, pneumothorax, ACS, PE, anemia, electrolyte abnormality, AKI.  Patient ill-appearing on arrival and in respiratory distress, found to have oxygen  saturations of 70% on room air.  She was transition to BiPAP, has very poor air movement throughout with faint end expiratory wheezing, suspect COPD as the primary driver of her respiratory failure.  We will further assess with x-ray and labs, EKG shows known left bundle branch block with no ischemic changes.  Plan to treat with DuoNebs and IV Solu-Medrol, reassess following labs and imaging but anticipate admission.  Chest x-ray looks concerning for CHF versus pneumonia, would favor CHF given her lower extremity edema with no productive cough or fever.  Labs reassuring with no leukocytosis, anemia comparable to previous.  Renal function stable compared to previous with no significant electrolyte abnormality, troponin within normal limits.  We will diurese with IV Lasix in addition to treatment for COPD, case discussed with hospitalist  for admission.      FINAL CLINICAL IMPRESSION(S) / ED DIAGNOSES   Final diagnoses:  COPD exacerbation ([place])  Acute respiratory failure with hypoxia ([place])  Acute on chronic congestive heart failure, unspecified heart failure type ([place])     Rx / DC Orders   ED Discharge Orders     None        Note:  This document was prepared using Dragon voice recognition software and may include unintentional dictation errors.   Blake Divine, MD 03/21/22 2348

## 2022-03-21 NOTE — ED Notes (Signed)
Pt did not request to be confidential and registration to change.

## 2022-03-21 NOTE — ED Triage Notes (Signed)
Pt arrived via [facility] from home in respiratory distress. Initial room air oxygen saturation for first responders in low 70's. Pt placed on Cpap by EMS with increase in saturation to 94%. EMS placed 1.5 Nitro paste as well in route. Pt recently hospitalized for same. MD in room as well as respiratory to initiate BiPap and assess.

## 2022-03-21 NOTE — ED Notes (Signed)
ED Provider at bedside. 

## 2022-03-22 ENCOUNTER — Other Ambulatory Visit: Payer: Self-pay

## 2022-03-22 ENCOUNTER — Ambulatory Visit: Payer: Medicare Other | Admitting: Internal Medicine

## 2022-03-22 DIAGNOSIS — E785 Hyperlipidemia, unspecified: Secondary | ICD-10-CM | POA: Insufficient documentation

## 2022-03-22 DIAGNOSIS — E1142 Type 2 diabetes mellitus with diabetic polyneuropathy: Secondary | ICD-10-CM | POA: Insufficient documentation

## 2022-03-22 DIAGNOSIS — J441 Chronic obstructive pulmonary disease with (acute) exacerbation: Secondary | ICD-10-CM

## 2022-03-22 DIAGNOSIS — I480 Paroxysmal atrial fibrillation: Secondary | ICD-10-CM | POA: Insufficient documentation

## 2022-03-22 DIAGNOSIS — K219 Gastro-esophageal reflux disease without esophagitis: Secondary | ICD-10-CM | POA: Insufficient documentation

## 2022-03-22 DIAGNOSIS — J9601 Acute respiratory failure with hypoxia: Secondary | ICD-10-CM | POA: Diagnosis not present

## 2022-03-22 LAB — BASIC METABOLIC PANEL
Anion gap: 12 (ref 5–15)
BUN: 30 mg/dL — ABNORMAL HIGH (ref 8–23)
CO2: 21 mmol/L — ABNORMAL LOW (ref 22–32)
Calcium: 9.5 mg/dL (ref 8.9–10.3)
Chloride: 109 mmol/L (ref 98–111)
Creatinine, Ser: 1.16 mg/dL — ABNORMAL HIGH (ref 0.44–1.00)
GFR, Estimated: 46 mL/min — ABNORMAL LOW (ref >60)
Glucose, Bld: 186 mg/dL — ABNORMAL HIGH (ref 70–99)
Potassium: 4.7 mmol/L (ref 3.5–5.1)
Sodium: 142 mmol/L (ref 135–145)

## 2022-03-22 LAB — CBG MONITORING, ED
Glucose-Capillary: 193 mg/dL — ABNORMAL HIGH (ref 70–99)
Glucose-Capillary: 230 mg/dL — ABNORMAL HIGH (ref 70–99)
Glucose-Capillary: 190 mg/dL — ABNORMAL HIGH (ref 70–99)
Glucose-Capillary: 210 mg/dL — ABNORMAL HIGH (ref 70–99)

## 2022-03-22 LAB — CBC
HCT: 31.6 % — ABNORMAL LOW (ref 36.0–46.0)
Hemoglobin: 8.5 g/dL — ABNORMAL LOW (ref 12.0–15.0)
MCH: 22.1 pg — ABNORMAL LOW (ref 26.0–34.0)
MCHC: 26.9 g/dL — ABNORMAL LOW (ref 30.0–36.0)
MCV: 82.1 fL (ref 80.0–100.0)
Platelets: 278 10*3/uL (ref 150–400)
RBC: 3.85 MIL/uL — ABNORMAL LOW (ref 3.87–5.11)
RDW: 18.3 % — ABNORMAL HIGH (ref 11.5–15.5)
WBC: 6.2 10*3/uL (ref 4.0–10.5)
nRBC: 0 % (ref 0.0–0.2)

## 2022-03-22 LAB — TROPONIN I (HIGH SENSITIVITY): Troponin I (High Sensitivity): 10 ng/L (ref <18)

## 2022-03-22 MED ORDER — CARVEDILOL 6.25 MG PO TABS
6.2500 mg | ORAL_TABLET | Freq: Two times a day (BID) | ORAL | Status: DC
  Administered 2022-03-22 – 2022-03-24 (×5): 6.25 mg via ORAL
  Filled 2022-03-22 (×5): qty 1

## 2022-03-22 MED ORDER — ONDANSETRON HCL 4 MG/2ML IJ SOLN: 4.0000 mg | Freq: Four times a day (QID) | INTRAMUSCULAR | Status: DC | PRN

## 2022-03-22 MED ORDER — FUROSEMIDE 10 MG/ML IJ SOLN
40.0000 mg | Freq: Two times a day (BID) | INTRAMUSCULAR | Status: DC
  Administered 2022-03-22 – 2022-03-23 (×3): 40 mg via INTRAVENOUS
  Filled 2022-03-22 (×3): qty 4

## 2022-03-22 MED ORDER — GUAIFENESIN ER 600 MG PO TB12
600.0000 mg | ORAL_TABLET | Freq: Two times a day (BID) | ORAL | Status: DC
  Administered 2022-03-22 – 2022-03-24 (×6): 600 mg via ORAL
  Filled 2022-03-22 (×6): qty 1

## 2022-03-22 MED ORDER — PANTOPRAZOLE SODIUM 40 MG PO TBEC
40.0000 mg | DELAYED_RELEASE_TABLET | Freq: Every day | ORAL | Status: DC
  Administered 2022-03-22 – 2022-03-24 (×3): 40 mg via ORAL
  Filled 2022-03-22 (×3): qty 1

## 2022-03-22 MED ORDER — LOSARTAN POTASSIUM 25 MG PO TABS
25.0000 mg | ORAL_TABLET | Freq: Every evening | ORAL | Status: DC
  Administered 2022-03-22 – 2022-03-23 (×2): 25 mg via ORAL
  Filled 2022-03-22 (×2): qty 1

## 2022-03-22 MED ORDER — ONDANSETRON HCL 4 MG PO TABS: 4.0000 mg | ORAL_TABLET | Freq: Four times a day (QID) | ORAL | Status: DC | PRN

## 2022-03-22 MED ORDER — MAGNESIUM HYDROXIDE 400 MG/5ML PO SUSP: 30.0000 mL | Freq: Every day | ORAL | Status: DC | PRN

## 2022-03-22 MED ORDER — ENOXAPARIN SODIUM 40 MG/0.4ML IJ SOSY
40.0000 mg | PREFILLED_SYRINGE | INTRAMUSCULAR | Status: DC
  Administered 2022-03-22 – 2022-03-24 (×3): 40 mg via SUBCUTANEOUS
  Filled 2022-03-22 (×3): qty 0.4

## 2022-03-22 MED ORDER — DAPAGLIFLOZIN PROPANEDIOL 10 MG PO TABS
10.0000 mg | ORAL_TABLET | Freq: Every day | ORAL | Status: DC
  Administered 2022-03-22 – 2022-03-24 (×3): 10 mg via ORAL
  Filled 2022-03-22 (×3): qty 1

## 2022-03-22 MED ORDER — LIDOCAINE 5 % EX PTCH
1.0000 | MEDICATED_PATCH | CUTANEOUS | Status: DC
  Administered 2022-03-23 – 2022-03-24 (×2): 1 via TRANSDERMAL
  Filled 2022-03-22 (×3): qty 1

## 2022-03-22 MED ORDER — ACETAMINOPHEN 325 MG PO TABS
650.0000 mg | ORAL_TABLET | Freq: Four times a day (QID) | ORAL | Status: DC | PRN
  Administered 2022-03-22 – 2022-03-24 (×3): 650 mg via ORAL
  Filled 2022-03-22 (×3): qty 2

## 2022-03-22 MED ORDER — HYDROCOD POLI-CHLORPHE POLI ER 10-8 MG/5ML PO SUER: 5.0000 mL | Freq: Two times a day (BID) | ORAL | Status: DC | PRN

## 2022-03-22 MED ORDER — ATORVASTATIN CALCIUM 20 MG PO TABS
40.0000 mg | ORAL_TABLET | Freq: Every day | ORAL | Status: DC
  Administered 2022-03-22 – 2022-03-24 (×3): 40 mg via ORAL
  Filled 2022-03-22 (×3): qty 2

## 2022-03-22 MED ORDER — GABAPENTIN 400 MG PO CAPS
400.0000 mg | ORAL_CAPSULE | Freq: Four times a day (QID) | ORAL | Status: DC
  Administered 2022-03-22 – 2022-03-23 (×7): 400 mg via ORAL
  Filled 2022-03-22 (×7): qty 1

## 2022-03-22 MED ORDER — HYDROXYZINE HCL 10 MG PO TABS: 10.0000 mg | ORAL_TABLET | Freq: Four times a day (QID) | ORAL | Status: DC | PRN

## 2022-03-22 MED ORDER — INSULIN ASPART 100 UNIT/ML IJ SOLN
0.0000 [IU] | Freq: Three times a day (TID) | INTRAMUSCULAR | Status: DC
  Administered 2022-03-22: 2 [IU] via SUBCUTANEOUS
  Administered 2022-03-22: 3 [IU] via SUBCUTANEOUS
  Administered 2022-03-23: 2 [IU] via SUBCUTANEOUS
  Administered 2022-03-23: 3 [IU] via SUBCUTANEOUS
  Administered 2022-03-23: 5 [IU] via SUBCUTANEOUS
  Filled 2022-03-22 (×5): qty 1

## 2022-03-22 MED ORDER — INSULIN ASPART 100 UNIT/ML IJ SOLN
0.0000 [IU] | Freq: Every day | INTRAMUSCULAR | Status: DC
  Administered 2022-03-22: 2 [IU] via SUBCUTANEOUS
  Administered 2022-03-23: 3 [IU] via SUBCUTANEOUS
  Filled 2022-03-22 (×2): qty 1

## 2022-03-22 MED ORDER — TIOTROPIUM BROMIDE MONOHYDRATE 18 MCG IN CAPS
18.0000 ug | ORAL_CAPSULE | Freq: Every day | RESPIRATORY_TRACT | Status: DC
  Administered 2022-03-23 – 2022-03-24 (×2): 18 ug via RESPIRATORY_TRACT
  Filled 2022-03-22: qty 5

## 2022-03-22 MED ORDER — ASPIRIN 81 MG PO TBEC
81.0000 mg | DELAYED_RELEASE_TABLET | Freq: Every day | ORAL | Status: DC
  Administered 2022-03-22 – 2022-03-24 (×3): 81 mg via ORAL
  Filled 2022-03-22 (×3): qty 1

## 2022-03-22 MED ORDER — LEVOTHYROXINE SODIUM 100 MCG PO TABS
100.0000 ug | ORAL_TABLET | Freq: Every day | ORAL | Status: DC
  Administered 2022-03-22 – 2022-03-24 (×3): 100 ug via ORAL
  Filled 2022-03-22: qty 1
  Filled 2022-03-22: qty 2
  Filled 2022-03-22: qty 1

## 2022-03-22 MED ORDER — IPRATROPIUM-ALBUTEROL 0.5-2.5 (3) MG/3ML IN SOLN
3.0000 mL | Freq: Four times a day (QID) | RESPIRATORY_TRACT | Status: DC
  Administered 2022-03-22 – 2022-03-23 (×4): 3 mL via RESPIRATORY_TRACT
  Filled 2022-03-22 (×4): qty 3

## 2022-03-22 MED ORDER — METHYLPREDNISOLONE SODIUM SUCC 40 MG IJ SOLR
40.0000 mg | Freq: Two times a day (BID) | INTRAMUSCULAR | Status: AC
  Administered 2022-03-22 – 2022-03-23 (×4): 40 mg via INTRAVENOUS
  Filled 2022-03-22 (×4): qty 1

## 2022-03-22 MED ORDER — AMIODARONE HCL 200 MG PO TABS
100.0000 mg | ORAL_TABLET | Freq: Every day | ORAL | Status: DC
  Administered 2022-03-22 – 2022-03-24 (×3): 100 mg via ORAL
  Filled 2022-03-22 (×3): qty 1

## 2022-03-22 MED ORDER — SODIUM CHLORIDE 0.9 % IV SOLN: INTRAVENOUS | Status: DC

## 2022-03-22 MED ORDER — SODIUM CHLORIDE 0.9 % IV SOLN
1.0000 g | INTRAVENOUS | Status: DC
  Administered 2022-03-22 – 2022-03-24 (×3): 1 g via INTRAVENOUS
  Filled 2022-03-22: qty 1
  Filled 2022-03-22 (×2): qty 10

## 2022-03-22 MED ORDER — ACETAMINOPHEN 650 MG RE SUPP: 650.0000 mg | Freq: Four times a day (QID) | RECTAL | Status: DC | PRN

## 2022-03-22 MED ORDER — TRAZODONE HCL 50 MG PO TABS
25.0000 mg | ORAL_TABLET | Freq: Every evening | ORAL | Status: DC | PRN
  Administered 2022-03-23: 25 mg via ORAL
  Filled 2022-03-22: qty 1

## 2022-03-22 NOTE — Assessment & Plan Note (Addendum)
-   She will be diuresed with IV Lasix as mentioned above. - We will continue Farxiga, Coreg and ARB therapy. - Strict I's and O's and daily weights. - We will follow serial troponins. - Last 2D echo revealed an EF 40 to 45% with grade 2 diastolic function in January 2022.

## 2022-03-22 NOTE — H&P (Signed)
[place]   PATIENT NAME: Kathryn [name]    MR#:  [id number]  DATE OF BIRTH:  [birth year]  DATE OF ADMISSION:  03/21/2022  PRIMARY CARE PHYSICIAN: [place]   Patient is coming from: Home  REQUESTING/REFERRING PHYSICIAN: Lurline Hare, MD  CHIEF COMPLAINT:   Chief Complaint  Patient presents with   Respiratory Distress    HISTORY OF PRESENT ILLNESS:  Kathryn [name] is a 88 y.o. Caucasian female with medical history significant for systolic and diastolic CHF with nonischemic cardiomyopathy, COPD, type 2 diabetes mellitus, PUD, hypertension, dyslipidemia, hypothyroidism, left BBB, PSVT and PVD, who presented to the ER with acute onset of worsening dyspnea and wheezing over the course of the day without fever or cough.  She has been having lower extremity edema but has not had any worsening.  No worsening orthopnea or paroxysmal nocturnal dyspnea.  She has been having dyspnea on exertion.  No chest pain or palpitations.  No nausea or vomiting or abdominal pain.  No dysuria, oliguria or hematuria or flank pain.  She was noted to be in significant aspiratory stress by EMS with pulse currently in the mid 70s.  She was initially placed on nonrebreather by the fire department then CPAP by EMS and was given 1.5 inch of Nitropaste before arrival.  She was also given 2 DuoNebs by EMS and 1 in the ER.  ED Course: When she came to the ER, BP was 147/64 with respiratory rate of 23 with pulse currently of 100% on 40% FiO2 on BiPAP.  Labs revealed a BUN of 38 creatinine 4.07 close to previous levels with a blood glucose of 173.  VBG showed pH 7.29 with HCO3 of 28.4 and pO2 of 39 and pCO2 59.  BNP was 676.4 and high sensitive troponin was 9.  CBC showed anemia better than previous levels and lactic acid was 1.7. EKG as reviewed by me : EKG showed sinus rhythm with a rate of 84 with prolonged PR interval and IVCD and possibly early left BBB Imaging: Portable chest ray showed  cardiomegaly and aortic atherosclerosis with interval increase in predominantly interstitial opacities in the lower lungs that are nonspecific and may be due to worsening pneumonia or edema.  The patient was given 40 mg of IV Lasix and 125 mg of IV Solu-Medrol.  She was given 2 DuoNebs by EMS in 1 year.  She will be admitted to a progressive unit bed for further evaluation and management. PAST MEDICAL HISTORY:   Past Medical History:  Diagnosis Date   Actinic keratosis    Aortic atherosclerosis ([place]) 05/25/2020   Arthritis    spinal stenosis   Cardiomyopathy - presumed to be nonischemic    a. 03/2020 Echo: EF 40-45%, gr2 DD. Nl RV fxn. Mildly dil LA. Mild MR/ao sclerosis; b. 03/2020 MV: EF 30-44%, no ischemia. Cor Ca2+ in pLAD.   CHF (congestive heart failure) (HCC)    COPD (chronic obstructive pulmonary disease) (HCC)    Coronary artery calcification seen on CT scan    a. 03/2020 MV: CT attenuation images show Ao and mod pLAD Ca2+.   Diabetes mellitus without complication ([place])    Gastric ulcer 06/2010   treated with Prolisec- states no problems now   Hyperlipidemia    Hypertension    PCP Dr Miguel Aschoff   [place]   Hypothyroidism    Left bundle branch block (LBBB)    Neuromuscular disorder (HCC)    slight numbness right toes-  comes and goes   Peripheral vascular disease (HCC)    varicose veins left leg   Pneumonia    PSVT (paroxysmal supraventricular tachycardia)    a. 03/2020 Zio: Avg HR 87 (66-211).  4 beats NSVT.  43 PSVT episodes, longest 10h 85m@ avg of 153 bpm (max 211).  Seen by EP-->amio started (pt wished to avoid EPS/RFCA).   Seasonal allergies    Shortness of breath    Skin cancer    multiple from face   Squamous cell carcinoma of skin 03/23/2013   L dorsal hand   Squamous cell carcinoma of skin 02/04/2014   R forearm/in situ, L pretibial   Squamous cell carcinoma of skin 09/24/2018   R thumb   Squamous cell carcinoma of skin 09/08/2019   L forearm - ED&C    Squamous cell carcinoma of skin 09/08/2019   R dosal hand - ED&C   Squamous cell carcinoma of skin 06/20/2021   R cheek preauricular, excised    PAST SURGICAL HISTORY:   Past Surgical History:  Procedure Laterality Date   BACK SURGERY     4/12  Dr AShellia Carwin for lumbar stenosis   BREAST CYST ASPIRATION Left 1965   negative   [place]  lumpectomy with biopsy   left   CARPAL TUNNEL RELEASE     right   COLONOSCOPY     COLONOSCOPY WITH PROPOFOL N/A 06/30/2015   Procedure: COLONOSCOPY WITH PROPOFOL;  Surgeon: PHulen Luster MD;  [facility];  Service: Gastroenterology;  Laterality: N/A;   COLONOSCOPY WITH PROPOFOL N/A 01/22/2017   Procedure: COLONOSCOPY WITH PROPOFOL;  Surgeon: SLollie Sails MD;  [facility];  Service: Endoscopy;  Laterality: N/A;   COLONOSCOPY WITH PROPOFOL N/A 01/28/2019   Procedure: COLONOSCOPY WITH PROPOFOL;  Surgeon: [place], TBenay Pike MD;  Location: [facility] ENDOSCOPY;  Service: Gastroenterology;  Laterality: N/A;   ESOPHAGOGASTRODUODENOSCOPY     ESOPHAGOGASTRODUODENOSCOPY (EGD) WITH PROPOFOL N/A 01/28/2019   Procedure: ESOPHAGOGASTRODUODENOSCOPY (EGD) WITH PROPOFOL;  Surgeon: Toledo, TBenay Pike MD;  Location: [facility] ENDOSCOPY;  Service: Gastroenterology;  Laterality: N/A;   EYE SURGERY     cataract extraction with IOL bilaterally   JOINT REPLACEMENT     LUMBAR LAMINECTOMY/DECOMPRESSION MICRODISCECTOMY  05/24/2011   Procedure: LUMBAR LAMINECTOMY/DECOMPRESSION MICRODISCECTOMY 2 LEVELS;  Surgeon: JMagnus Sinning MD;  Location: [facility];  Service: Orthopedics;  Laterality: N/A;  L2-L3, L3-L4 (x-ray)   RIGHT OOPHORECTOMY     due to STAPH INFECTION    SOCIAL HISTORY:   Social History   Tobacco Use   Smoking status: Former    Packs/day: 0.50    Years: 50.00    Total pack years: 25.00    Types: Cigarettes    Quit date: 05/16/2004    Years since quitting: 17.8   Smokeless tobacco: Never  Substance Use Topics   Alcohol use: Yes     Comment: socially-1 glass of wine or [place] icea tea once a month    FAMILY HISTORY:   Family History  Problem Relation Age of Onset   Dementia Mother    Lung cancer Father    Heart disease Father    Hypertension Sister    Breast cancer Maternal Aunt    Arthritis Son     DRUG ALLERGIES:   Allergies  Allergen Reactions   Oxycodone Other (See Comments)    Mental status change   Prednisone     swelling, bruising.    REVIEW OF SYSTEMS:  ROS As per history of present illness. All pertinent systems were reviewed above. Constitutional, HEENT, cardiovascular, respiratory, GI, GU, musculoskeletal, neuro, psychiatric, endocrine, integumentary and hematologic systems were reviewed and are otherwise negative/unremarkable except for positive findings mentioned above in the HPI.   MEDICATIONS AT HOME:   Prior to Admission medications   Medication Sig Start Date End Date Taking? Authorizing Provider  acetaminophen (TYLENOL) 500 MG tablet Take 1,000 mg by mouth every 6 (six) hours as needed. Pain     [provider]  albuterol (PROVENTIL) (2.5 MG/3ML) 0.083% nebulizer solution USE 1 VIAL VIA NEBULIZER EVERY 6 HOURS AS NEEDED FOR WHEEZING OR SHORTNESS OF BREATH 10/23/21   Jerrol Banana., MD  amiodarone (PACERONE) 200 MG tablet Take 0.5 tablets (100 mg total) by mouth daily. 12/27/21   Vickie Epley, MD  aspirin 81 MG EC tablet Take 81 mg by mouth daily. Swallow whole.    [provider]  atorvastatin (LIPITOR) 40 MG tablet TAKE 1 TABLET(40 MG) BY MOUTH DAILY 01/29/22   Minna Merritts, MD  carvedilol (COREG) 6.25 MG tablet TAKE 1 TABLET(6.25 MG) BY MOUTH TWICE DAILY WITH A MEAL 01/29/22   Gollan, Kathlene November, MD  dapagliflozin propanediol (FARXIGA) 10 MG TABS tablet Take 1 tablet (10 mg total) by mouth daily. 03/12/22   Minna Merritts, MD  gabapentin (NEURONTIN) 400 MG capsule Take by mouth 4 (four) times daily. 03/08/22   [provider]   glucose blood (CONTOUR NEXT TEST) test strip Check sugar once daily  DX E11.9 11/16/16   Jerrol Banana., MD  hydrOXYzine (ATARAX) 10 MG tablet TAKE 1 TO 2 TABLETS(10 TO 20 MG) BY MOUTH EVERY 6 HOURS AS NEEDED 03/07/22   Myles Gip, DO  levothyroxine (SYNTHROID) 100 MCG tablet TAKE 1 TABLET(100 MCG) BY MOUTH DAILY BEFORE BREAKFAST 02/26/22   Myles Gip, DO  lidocaine (LIDODERM) 5 % 1 patch daily. 12/19/21   [provider]  losartan (COZAAR) 25 MG tablet Take 1 tablet (25 mg total) by mouth every evening. 07/17/21   Alisa Graff, FNP  metFORMIN (GLUCOPHAGE) 1000 MG tablet TAKE 1 TABLET(1000 MG) BY MOUTH DAILY WITH SUPPER 02/05/22   Myles Gip, DO  omeprazole (PRILOSEC) 20 MG capsule TAKE 1 CAPSULE(20 MG) BY MOUTH DAILY 01/01/22   Ostwalt, Lamar, PA-C  SPIRIVA HANDIHALER 18 MCG inhalation capsule PLACE 1 CAPSULE INTO INHALER AND INHALE EVERY DAY AS NEEDED 05/25/21   Jerrol Banana., MD  torsemide (DEMADEX) 20 MG tablet Take 40 mg by mouth daily as needed. 02/26/22   [provider]      VITAL SIGNS:  Blood pressure (!) 147/64, pulse 80, resp. rate (!) 23, SpO2 100 %.  PHYSICAL EXAMINATION:  Physical Exam  GENERAL: Acutely ill 88 y.o.-year-old patient lying in the bed with mild respiratory distress on BiPAP with conversational dyspnea. EYES: Pupils equal, round, reactive to light and accommodation. No scleral icterus. Extraocular muscles intact.  HEENT: Head atraumatic, normocephalic. Oropharynx and nasopharynx clear.  NECK:  Supple, no jugular venous distention. No thyroid enlargement, no tenderness.  LUNGS: Diminished bibasilar breath sounds with bibasal rales, diminished expiratory airflow with scattered expiratory wheezes and harsh vesicular breathing. CARDIOVASCULAR: Regular rate and rhythm, S1, S2 normal. No murmurs, rubs, or gallops.  ABDOMEN: Soft, nondistended, nontender. Bowel sounds present. No organomegaly or mass.  EXTREMITIES: 2+  bilateral lower extremity pitting edema with no cyanosis, or clubbing.  NEUROLOGIC: Cranial nerves II through XII are intact.  Muscle strength 5/5 in all extremities. Sensation intact. Gait not checked.  PSYCHIATRIC: The patient is alert and oriented x 3.  Normal affect and good eye contact. SKIN: No obvious rash, lesion, or ulcer.   LABORATORY PANEL:   CBC Recent Labs  Lab 03/21/22 2225  WBC 7.6  HGB 8.9*  HCT 33.3*  PLT 336   ------------------------------------------------------------------------------------------------------------------  Chemistries  Recent Labs  Lab 03/15/22 1246 03/16/22 0628 03/21/22 2225  NA 140   < > 143  K 4.0   < > 4.5  CL 101   < > 114*  CO2 32   < > 23  GLUCOSE 167*   < > 173*  BUN 22   < > 30*  CREATININE 1.14*   < > 1.07*  CALCIUM 9.4   < > 9.6  MG 1.9  --   --   AST  --   --  21  ALT  --   --  16  ALKPHOS  --   --  93  BILITOT  --   --  0.5   < > = values in this interval not displayed.   ------------------------------------------------------------------------------------------------------------------  Cardiac Enzymes No results for input(s): "TROPONINI" in the last 168 hours. ------------------------------------------------------------------------------------------------------------------  RADIOLOGY:  DG Chest Portable 1 View  Result Date: 03/21/2022 CLINICAL DATA:  Shortness of breath EXAM: PORTABLE CHEST 1 VIEW COMPARISON:  03/13/2022 FINDINGS: Stable cardiomegaly. Aortic atherosclerotic calcification. Increased pulmonary vascular congestion and interstitial opacities with a lower lung predominance. Question small left pleural effusion. No pneumothorax. Advanced arthritis both shoulders IMPRESSION: Interval increase in predominantly interstitial opacities in the lower lungs. This is nonspecific and may be due to worsening pneumonia or edema. Cardiomegaly. Aortic Atherosclerosis (ICD10-I70.0). Electronically Signed   By: Placido Sou M.D.   On: 03/21/2022 22:48      IMPRESSION AND PLAN:  Assessment and Plan: * Acute respiratory failure with hypoxia ([place]) - Secondary to acute on chronic diastolic and systolic CHF as well as COPD acute exacerbation. - We will continue her on BiPAP. - We will place her on diuresis with IV Lasix while as nebulized DuoNebs 4 times daily and every 4 hours as needed in addition to IV Solu-Medrol. - We will add IV antibiotic therapy with IV Rocephin. - Mucolytic therapy will be provided. - We will follow serial troponins. - Will diurese with IV Lasix.  Acute on chronic combined systolic and diastolic CHF (congestive heart failure) ([place]) - She will be diuresed with IV Lasix as mentioned above. - We will continue Farxiga, Coreg and ARB therapy. - Strict I's and O's and daily weights. - We will follow serial troponins. - Last 2D echo revealed an EF 40 to 45% with grade 2 diastolic function in January 2022.  COPD with acute exacerbation (HCC) - Bronchodilator therapy as well as IV steroid therapy will be provided as mentioned above. - We will add IV Rocephin and mucolytic therapy.  Hypothyroidism - We will continue Synthroid.  GERD without esophagitis - We will continue PPI therapy.  Dyslipidemia - We will continue statin therapy.  Type 2 diabetes mellitus with peripheral neuropathy (HCC) - We will place on supplemental coverage with normal chronic. - We will hold off metformin. - We will continue Neurontin.  Paroxysmal atrial fibrillation (HCC) - We will continue amiodarone and aspirin. - The patient is apparently a fall risk therefore is not on any anticoagulation.    DVT prophylaxis: Lovenox.  Advanced Care Planning:  Code Status: The patient is  DNR/DNI.  This was confirmed with her. Family Communication:  The plan of care was discussed in details with the patient (and family). I answered all questions. The patient agreed to proceed with the above mentioned plan.  Further management will depend upon hospital course. Disposition Plan: Back to previous home environment Consults called: none.  All the records are reviewed and case discussed with ED provider.  Status is: Inpatient    At the time of the admission, it appears that the appropriate admission status for this patient is inpatient.  This is judged to be reasonable and necessary in order to provide the required intensity of service to ensure the patient's safety given the presenting symptoms, physical exam findings and initial radiographic and laboratory data in the context of comorbid conditions.  The patient requires inpatient status due to high intensity of service, high risk of further deterioration and high frequency of surveillance required.  I certify that at the time of admission, it is my clinical judgment that the patient will require inpatient hospital care extending more than 2 midnights.                            Dispo: The patient is from: Home              Anticipated d/c is to: Home              Patient currently is not medically stable to d/c.              Difficult to place patient: No  Authorized and performed by: Eugenie Norrie, MD Total critical care time: Approximately  55     minutes. Due to a high probability of clinically significant, life-threatening deterioration, the patient required my highest level of preparedness to intervene emergently and I personally spent this critical care time directly and personally managing the patient.  This critical care time included obtaining a history, examining the patient, pulse oximetry, ordering and review of studies, arranging urgent treatment with development of management plan, evaluation of patient's response to treatment, frequent reassessment, and discussions with other providers. This critical care time was performed to assess and manage the high probability of imminent, life-threatening deterioration that could result in multiorgan  failure.  It was exclusive of separately billable procedures and treating other patients and teaching time.   Christel Mormon M.D on 03/22/2022 at 12:56 AM  [facility]   From 7 PM-7 AM, contact night-coverage [email or web address]  CC: Primary care physician; [place]

## 2022-03-22 NOTE — ED Notes (Signed)
Hospitalist in with patient.

## 2022-03-22 NOTE — Assessment & Plan Note (Signed)
-   We will place on supplemental coverage with normal chronic. - We will hold off metformin. - We will continue Neurontin.

## 2022-03-22 NOTE — Assessment & Plan Note (Signed)
-   Bronchodilator therapy as well as IV steroid therapy will be provided as mentioned above. - We will add IV Rocephin and mucolytic therapy.

## 2022-03-22 NOTE — Assessment & Plan Note (Addendum)
-   We will continue Synthroid. 

## 2022-03-22 NOTE — Assessment & Plan Note (Signed)
-   We will continue amiodarone and aspirin. - The patient is apparently a fall risk therefore is not on any anticoagulation.

## 2022-03-22 NOTE — TOC Initial Note (Addendum)
Transition of Care [facility]) - Initial/Assessment Note    Patient Details  Name: Kathryn [name] MRN: [id number] Date of Birth: [birth year]  [facility]) CM/SW Contact:    Candie Chroman, LCSW Phone Number: 03/22/2022, 3:41 PM  Clinical Narrative: CSW met with patient. Son and granddaughter at bedside. CSW introduced role and explained that discharge planning would be discussed. PCP is Dr. Ky Barban at [facility]. Son, sister, and granddaughter transport her to appointments. Pharmacy is [facility] in [place]. No issues obtaining medications. Patient was set up with [facility] for PT last admission but they have not started services yet. Patient has a cane, rollator, and wheelchair at home. She is currently on acute oxygen. Will follow for this need as well. Patient confirmed she has a scale at home but does not weigh daily. No further concerns. CSW encouraged patient and her family to contact CSW as needed. CSW will continue to follow patient for support and facilitate return home once stable.               Expected Discharge Plan: [place] Barriers to Discharge: Barriers Resolved   Patient Goals and CMS Choice     Choice offered to / list presented to : Patient      Expected Discharge Plan and Services     Post Acute Care Choice: Resumption of Svcs/PTA Provider Living arrangements for the past 2 months: [place]: PT [place]: [place] ([place]) Date [place]: 03/22/22   Representative spoke with at [place]: Floydene Flock  Prior Living Arrangements/Services Living arrangements for the past 2 months: [place]   Patient language and need for interpreter reviewed:: Yes Do you feel safe going back to the place where you live?: Yes      Need for Family Participation in Patient Care: Yes (Comment) Care giver support system in place?: Yes  (comment) Current home services: Home PT Criminal Activity/Legal Involvement Pertinent to Current Situation/Hospitalization: No - Comment as needed  Activities of Daily Living      Permission Sought/Granted Permission sought to share information with : Facility Sport and exercise psychologist, Family Supports Permission granted to share information with : Yes, Verbal Permission Granted  Share Information with NAME: [name] and [place] granted to share info w AGENCY: [place] granted to share info w Relationship: Son and granddaughter  Permission granted to share info w Contact Information: [name]: [phone], [place]: [phone]  Emotional Assessment Appearance:: Appears stated age Attitude/Demeanor/Rapport: Engaged, Gracious Affect (typically observed): Accepting, Appropriate, Calm, Pleasant Orientation: : Oriented to Self, Oriented to Place, Oriented to  Time, Oriented to Situation Alcohol / Substance Use: Not Applicable Psych Involvement: No (comment)  Admission diagnosis:  Acute respiratory failure with hypoxia ([place]) [J96.01] Patient Active Problem List   Diagnosis Date Noted   COPD with acute exacerbation ([place]) 03/22/2022   Paroxysmal atrial fibrillation ([place]) 03/22/2022   Type 2 diabetes mellitus with peripheral neuropathy ([place]) 03/22/2022   Dyslipidemia 03/22/2022   GERD without esophagitis 03/22/2022   Lymphedema 03/20/2022   Acute renal failure superimposed on stage 3a chronic kidney disease ([place]) 03/13/2022   HLD (hyperlipidemia) 03/13/2022   CAD (coronary artery disease) 03/13/2022   Obesity with body mass index of 30.0-39.9 03/13/2022   Atherosclerosis of native  arteries of the extremities with ulceration ([place]) 01/29/2022   Leg wound, right 01/16/2022   Normocytic anemia 12/21/2020   On amiodarone therapy 08/24/2020   Acute on chronic combined systolic and diastolic CHF (congestive heart failure) ([place]) 06/06/2020   PSVT  (paroxysmal supraventricular tachycardia)    AKI (acute kidney injury) ([place]) 05/26/2020   Aortic atherosclerosis ([place]) 05/25/2020   Acute on chronic respiratory failure with hypoxia ([place]) 05/24/2020   COPD exacerbation ([place]) 05/24/2020   Diabetes mellitus without complication ([place]) 35/67/0141   Acute CHF ([place]) 03/27/2020   Acute CHF (congestive heart failure) ([place]) 03/27/2020   Acute respiratory failure with hypoxia (HCC)    Diabetes mellitus type 2, uncomplicated ([place]) 05/24/3141   COPD, mild ([place]) 09/17/2014   Clinical depression 09/17/2014   Elevated platelet count 09/17/2014   Elevated erythrocyte sedimentation rate 09/17/2014   History of tobacco use 09/17/2014   Hypercholesteremia 09/17/2014   BP (high blood pressure) 09/17/2014   Hypothyroidism 09/17/2014   Cannot sleep 09/17/2014   Lumbar disc disease with radiculopathy 09/17/2014   Arthritis, degenerative 09/17/2014   Excess weight 09/17/2014   Vertebrobasilar circulation transient ischemic attack 09/17/2014   Lung nodule, multiple 09/17/2014   Basilar artery insufficiency 09/17/2014   Neuritis or radiculitis due to rupture of lumbar intervertebral disc 08/25/2013   Degenerative arthritis of lumbar spine 08/25/2013   Lumbar canal stenosis 08/25/2013   Smoker 07/15/2012   Back pain 07/15/2012   SOB (shortness of breath) 07/15/2012   Essential hypertension 07/15/2012   PCP:  [place] Pharmacy:   [id number] [address], [place] [place] [address] Phone: [phone] Fax: [phone]     Social Determinants of Health (SDOH) Social History: SDOH Screenings   Food Insecurity: No Food Insecurity (03/20/2022)  Housing: Low Risk  (03/20/2022)  Transportation Needs: No Transportation Needs (03/20/2022)  Utilities: Not At Risk (03/20/2022)  Alcohol Screen: Low Risk  (11/16/2021)  Depression (PHQ2-9): Medium Risk  (11/16/2021)  Tobacco Use: Medium Risk (03/21/2022)   SDOH Interventions:     Readmission Risk Interventions    05/27/2020   12:12 PM  Readmission Risk Prevention Plan  Transportation Screening Complete  PCP or Specialist Appt within 5-7 Days Complete  Home Care Screening Complete  Medication Review (RN CM) Complete

## 2022-03-22 NOTE — Assessment & Plan Note (Signed)
-   We will continue PPI therapy 

## 2022-03-22 NOTE — ED Notes (Addendum)
PT/OT at bedside at this time.

## 2022-03-22 NOTE — ED Notes (Signed)
Lunch meal tray given at this time.  

## 2022-03-22 NOTE — Telephone Encounter (Signed)
Is the patient able to start services on 03/28/22 or will she need additional orders to delay start of services? Please call to clarify additional needs for [facility].   [name], MD  [facility]

## 2022-03-22 NOTE — Evaluation (Signed)
Occupational Therapy Evaluation Patient Details Name: Kathryn [name] MRN: [id number] DOB: 1933/09/05 Today's Date: 03/22/2022   History of Present Illness Pt is an 88 y/o female presented to ED on 03/13/22 for SOB. Found to be hypoxic in the ED and placed on BiPAP. Admitted for acute respiratory failure 2/2 COPD and CHF exacerbation. PMH: CHF, COPD, CAD, HTN, CKD, PVD.   Clinical Impression   Patient received for OT evaluation. See flowsheet below for details of function. Generally, patient requiring SBA for bed mobility, SBA with RW for functional mobility, and MOD (I) for ADLs; requires assistance at this time for IADLs 2/2 decreased activity tolerance. Pt declines further OT at this time. Pt at or near baseline for ADL function since prior d/c. Patient with no further need for OT in acute care; discharge OT services.   Recommendations for follow up therapy are one component of a multi-disciplinary discharge planning process, led by the attending physician.  Recommendations may be updated based on patient status, additional functional criteria and insurance authorization.   Follow Up Recommendations  No OT follow up     Assistance Recommended at Discharge Intermittent Supervision/Assistance  Patient can return home with the following Help with stairs or ramp for entrance;Assistance with cooking/housework    Functional Status Assessment  Patient has not had a recent decline in their functional status  Equipment Recommendations  None recommended by OT    Recommendations for Other Services PT consult     Precautions / Restrictions Precautions Precautions: Fall Restrictions Weight Bearing Restrictions: No      Mobility Bed Mobility Overal bed mobility: Modified Independent             General bed mobility comments: Increased time and effort    Transfers Overall transfer level: Modified independent Equipment used: Rolling walker (2 wheels)                General transfer comment: Pt able to sidestep at EOB approx 4 times (2 minutes); moving well. On 3L O2 with 96% throughout session. Pt stating she does not feel short of breath.      Balance Overall balance assessment: Needs assistance Sitting-balance support: No upper extremity supported, Feet supported Sitting balance-Leahy Scale: Good     Standing balance support: During functional activity, Bilateral upper extremity supported, Reliant on assistive device for balance Standing balance-Leahy Scale: Fair                             ADL either performed or assessed with clinical judgement   ADL Overall ADL's : At baseline                                       General ADL Comments: Required assistance today to don brief at bed level 2/2 pt being on lasix and urinating as soon as she stands up. Granddaughter assisted with donning slip-on shoes at bed level, but pt able to doff shoes at end of session (likely to be able to don shoes at EOB). Pt states she feels at baseline with ADLs using DME.     Vision Baseline Vision/History: 1 Wears glasses Ability to See in Adequate Light: 0 Adequate       Perception     Praxis      Pertinent Vitals/Pain Pain Assessment Pain Assessment: No/denies pain     Hand Dominance  Extremity/Trunk Assessment Upper Extremity Assessment Upper Extremity Assessment: Overall WFL for tasks assessed   Lower Extremity Assessment Lower Extremity Assessment: Defer to PT evaluation   Cervical / Trunk Assessment Cervical / Trunk Assessment: Normal   Communication Communication Communication: HOH   Cognition Arousal/Alertness: Awake/alert Behavior During Therapy: WFL for tasks assessed/performed Overall Cognitive Status: Within Functional Limits for tasks assessed                                 General Comments: Alert, communicative, spunky. States she does not generally benefit from OT, but is ok with  PT, however is kind and jovial to this OT during session. Follows all commands WNL.     General Comments       Exercises     Shoulder Instructions      Home Living Family/patient expects to be discharged to:: Private residence Living Arrangements: Children (pt lives in an addition to son's home.) Available Help at Discharge: Family Type of Home: House Home Access: Stairs to enter Technical brewer of Steps: 2   [place]: One level     Bathroom Shower/Tub: Occupational psychologist: Handicapped height Bathroom Accessibility: Yes How Accessible: Accessible via [place]: Parks (4 wheels);Wheelchair - manual;Shower seat;Grab bars - toilet;Grab bars - tub/shower   Additional Comments: Pt reports being on no O2 at home. Granddaughter states that area is handicapped accessible. Granddaughter stating they just got pt an Apple watch with fall detection for increased safety at home.      Prior Functioning/Environment Prior Level of Function : Independent/Modified Independent             Mobility Comments: rollator household, w/c for community distances, reports 3 recent falls prior to last hospitalization; reports 1 fall on 12/24 since d/c home; states her rollator got away from her (brakes were not on) while she was standing at a countertop. Pt states she does not feel secure with rollator and is requesting a RW. ADLs Comments: (I) ADLs. Assist for IADLs.        OT Problem List: Decreased strength;Decreased activity tolerance;Impaired balance (sitting and/or standing);Decreased safety awareness      OT Treatment/Interventions:      OT Goals(Current goals can be found in the care plan section) Acute Rehab OT Goals Patient Stated Goal: Go home and be well. OT Goal Formulation: All assessment and education complete, DC therapy  OT Frequency:      Co-evaluation              AM-PAC OT "6 Clicks" Daily Activity     Outcome Measure Help  from another person eating meals?: None Help from another person taking care of personal grooming?: None Help from another person toileting, which includes using toliet, bedpan, or urinal?: None Help from another person bathing (including washing, rinsing, drying)?: None Help from another person to put on and taking off regular upper body clothing?: None Help from another person to put on and taking off regular lower body clothing?: A Little 6 Click Score: 23   End of Session Equipment Utilized During Treatment: Rolling walker (2 wheels) Nurse Communication: Mobility status  Activity Tolerance: Patient tolerated treatment well Patient left: in bed;with nursing/sitter in room;with call bell/phone within reach  OT Visit Diagnosis: History of falling (Z91.81)                Time: 4315-4008 OT Time Calculation (min): 23 min  Charges:  OT General Charges $OT Visit: 1 Visit OT Evaluation $OT Eval Moderate Complexity: 1 Mod  [place], MS, OTR/L   Vania Rea 03/22/2022, 3:56 PM

## 2022-03-22 NOTE — ED Notes (Signed)
Purewick canister at this time. Pt's bed pad changed at this time.

## 2022-03-22 NOTE — Assessment & Plan Note (Signed)
-   We will continue statin therapy. 

## 2022-03-22 NOTE — Assessment & Plan Note (Signed)
-   Secondary to acute on chronic diastolic and systolic CHF as well as COPD acute exacerbation. - We will continue her on BiPAP. - We will place her on diuresis with IV Lasix while as nebulized DuoNebs 4 times daily and every 4 hours as needed in addition to IV Solu-Medrol. - We will add IV antibiotic therapy with IV Rocephin. - Mucolytic therapy will be provided. - We will follow serial troponins. - Will diurese with IV Lasix.

## 2022-03-22 NOTE — ED Notes (Signed)
Report received from Jacob, RN 

## 2022-03-22 NOTE — ED Notes (Signed)
Pt given cup of coffee at this time.  

## 2022-03-22 NOTE — ED Notes (Signed)
Breakfast meal tray delivered by dietary at this time.

## 2022-03-22 NOTE — Telephone Encounter (Signed)
NA- Will try tomorrow

## 2022-03-22 NOTE — Evaluation (Signed)
Physical Therapy Evaluation Patient Details Name: Kathryn [name] MRN: [id number] DOB: 1934-02-29 Today's Date: 03/22/2022  History of Present Illness  Per MD:  Pt is an 88 y/o female presented to ED complaining of shortness of breath.  Patient reports that she has been feeling better following recent mission to the hospital for COPD and CHF exacerbation, but has had increasing difficulty breathing over the course of the day today.  She denies any pain in her chest and has not noticed any fever or cough.  She states that her legs stay swollen but has not noticed any worsening swelling.  EMS reports that they found patient to be in respiratory distress, with oxygen saturations in the mid 70s.  She was placed on a nonrebreather by the fire department, subsequently transitioned to CPAP by EMS.  1.5 inches of Nitropaste was applied to her chest prior to arrival. PMH: CHF, COPD, CAD, HTN, CKD, PVD.    Clinical Impression  Pt received in Semi-Fowler's position and agreeable to therapy.  Pt notes that she is not looking forward to therapy but is ready to participate.  Pt given new gown to drape across her as she ambulates down the hallway.  Pt able to perform good mobility with the use of the RW.  Pt advised to use the one she has at home when feeling like she needs extra stability instead of the rollator.  Pt ambulated to the other section of the ED and back ~60 feet, maybe further.  Pt then assisted back to the bed and purewick hooked back up.  Pt requested nursing to come in and check on her purewick placement when therapist left.  Nursing notified of ambulation attempt and request for nurse to assist.  Recommended discharge plan to home with HHPT is the most appropriate at this time.  Pt will continue to benefit from skilled therapy in order to address deficits listed below.      Recommendations for follow up therapy are one component of a multi-disciplinary discharge planning process, led by the  attending physician.  Recommendations may be updated based on patient status, additional functional criteria and insurance authorization.  Follow Up Recommendations Home health PT      Assistance Recommended at Discharge Intermittent Supervision/Assistance  Patient can return home with the following  A little help with walking and/or transfers;Help with stairs or ramp for entrance;Assist for transportation;Assistance with cooking/housework    Equipment Recommendations Rolling walker (2 wheels)  Recommendations for Other Services       Functional Status Assessment Patient has had a recent decline in their functional status and demonstrates the ability to make significant improvements in function in a reasonable and predictable amount of time.     Precautions / Restrictions Precautions Precautions: Fall Restrictions Weight Bearing Restrictions: No      Mobility  Bed Mobility Overal bed mobility: Modified Independent             General bed mobility comments: Increased time and effort    Transfers Overall transfer level: Modified independent Equipment used: Rolling walker (2 wheels)               General transfer comment: Pt able to ambulate outside of the ED and with 3L of O2 remains >90% O2 saturation.  Does experience some SOB at the conclusion of ambulation attempt.    Ambulation/Gait Ambulation/Gait assistance: Min guard Gait Distance (Feet): 60 Feet Assistive device: Rolling walker (2 wheels) Gait Pattern/deviations: Step-through pattern, Decreased step length - right,  Decreased step length - left, Trunk flexed Gait velocity: decreased     General Gait Details: Slowed cadence and has some difficulty with turning around in the hallway.  Stairs            Wheelchair Mobility    Modified [place] (Stroke Patients Only)       Balance                                             Pertinent Vitals/Pain Pain Assessment Pain  Assessment: No/denies pain (Does note some discomfort in the low back after ambulating.)    Home Living Family/patient expects to be discharged to:: Private residence Living Arrangements: Children (pt lives in an addition to son's home.) Available Help at Discharge: Family Type of Home: House Home Access: Stairs to enter   Technical brewer of Steps: 2   Home Layout: One [place]: Rollator (4 wheels);Wheelchair - manual;Shower seat;Grab bars - toilet;Grab bars - tub/shower Additional Comments: Pt reports being on no O2 at home. Granddaughter states that area is handicapped accessible. Granddaughter stating they just got pt an Apple watch with fall detection for increased safety at home.    Prior Function Prior Level of Function : Independent/Modified Independent             Mobility Comments: rollator household, w/c for community distances, reports 3 recent falls prior to last hospitalization; reports 1 fall on 12/24 since d/c home; states her rollator got away from her (brakes were not on) while she was standing at a countertop. Pt states she does not feel secure with rollator and is requesting a RW. ADLs Comments: (I) ADLs. Assist for IADLs.     Hand Dominance   Dominant Hand: Right    Extremity/Trunk Assessment   Upper Extremity Assessment Upper Extremity Assessment: Defer to OT evaluation    Lower Extremity Assessment Lower Extremity Assessment: Generalized weakness    Cervical / Trunk Assessment Cervical / Trunk Assessment: Normal  Communication   Communication: HOH  Cognition Arousal/Alertness: Awake/alert Behavior During Therapy: WFL for tasks assessed/performed Overall Cognitive Status: Within Functional Limits for tasks assessed                                          General Comments      Exercises     Assessment/Plan    PT Assessment Patient needs continued PT services  PT Problem List Decreased  strength;Decreased activity tolerance;Decreased balance;Decreased mobility;Cardiopulmonary status limiting activity       PT Treatment Interventions      PT Goals (Current goals can be found in the Care Plan section)  Acute Rehab PT Goals Patient Stated Goal: to go home PT Goal Formulation: With patient Time For Goal Achievement: 03/28/22 Potential to Achieve Goals: Good    Frequency Min 2X/week     Co-evaluation               AM-PAC PT "6 Clicks" Mobility  Outcome Measure Help needed turning from your back to your side while in a flat bed without using bedrails?: None Help needed moving from lying on your back to sitting on the side of a flat bed without using bedrails?: None Help needed moving to and from a bed to a chair (including a  wheelchair)?: A Little Help needed standing up from a chair using your arms (e.g., wheelchair or bedside chair)?: A Little Help needed to walk in hospital room?: A Little Help needed climbing 3-5 steps with a railing? : A Little 6 Click Score: 20    End of Session Equipment Utilized During Treatment: Gait belt Activity Tolerance: Patient tolerated treatment well Patient left: with call bell/phone within reach;in chair;with chair alarm set Nurse Communication: Mobility status PT Visit Diagnosis: Unsteadiness on feet (R26.81);Muscle weakness (generalized) (M62.81);History of falling (Z91.81)    Time: 2233-6122 PT Time Calculation (min) (ACUTE ONLY): 26 min   Charges:   PT Evaluation $PT Eval Low Complexity: 1 Low PT Treatments $Gait Training: 8-22 mins        Gwenlyn Saran, PT, DPT Physical Therapist- [facility]  03/22/22, 4:19 PM

## 2022-03-22 NOTE — Progress Notes (Signed)
PROGRESS NOTE    Kathryn [name]  IHK:[id number] DOB: 03-20-1934 DOA: 03/21/2022 PCP: [facility]    Brief Narrative:   Kathryn [name] is a 88 y.o. female with past medical history significant for chronic systolic and diastolic congestive heart failure, nonischemic cardiomyopathy, COPD, type 2 diabetes mellitus, peptic ulcer disease, HTN, HLD, hypothyroidism, left bundle branch block, PSVT, PVD who presented to [facility] ED on 12/27 with acute onset shortness of breath, wheezing and lower extremity edema.  Patient reports dyspnea worse on exertion without chest pain or palpitations.  Denies nausea/vomiting, no abdominal pain, no urinary symptoms.  On EMS arrival patient was noted to be in significant respiratory distress and was placed on nonrebreather followed by CPAP and was given 1.5 inch of Nitropaste, 2 DuoNebs and route to the ED.  In the ED, temperature 98.4 F, HR 83, RR 26, BP 143/58, SpO2 70%. WBC 7.6, hemoglobin 8.9, platelets 336.  Sodium 143, potassium 4.5, chloride 114, CO2 23, glucose 173, BUN 30, creatinine 1.07.  AST 21, ALT 16, total bilirubin 0.5.  BNP 676.4.  High sensitive troponin 9>10.  Covid-19 PCR negative.  RSV PCR negative.  Influenza A/B PCR negative.  Chest x-ray with interval increase interstitial opacities in lower lungs likely secondary to pulmonary edema versus pneumonia, noted cardiomegaly.  Patient was continued on BiPAP, started on IV furosemide.  TRH consulted for admission for further evaluation management of acute hypoxic respite failure secondary to CHF exacerbation.  Assessment & Plan:   Acute hypoxic respiratory failure, POA Acute on chronic combined systolic/diastolic congestive heart failure exacerbation Patient presenting from home with acute onset shortness of breath associated with lower extremity edema.  Patient is afebrile without leukocytosis.  Elevated BNP and chest x-ray findings consistent with volume overload.  Initially requiring  BiPAP which has since been titrated off.-TTE January 2022 with LVEF 75-64%, grade 2 diastolic dysfunction. -- Furosemide 40 mg IV every 12 hours -- Fluid restriction 1500 mL/day -- Strict I's and O's and daily weights -- Continue supplemental oxygen, maintain SpO2 greater than 88% -- BMP daily  COPD exacerbation -- Solu-Medrol 40 mg IV every 12 hours -- Ceftriaxone 1 g IV every 24 hours -- DuoNebs 4 times daily -- Spiriva daily  Type 2 diabetes mellitus Hemoglobin A1c 6.9 on 12/12/2021, well-controlled.  On Farxiga and metformin outpatient; will hold during hospitalization --SSI for coverge --CBGs qAC/HS  Peptic ulcer disease -- Protonix 40 mg p.o. daily  HTN -- Losartan 25 mg p.o. daily  HLD -- Atorvastatin 40 mg p.o. daily  Hypothyroidism -- Levothyroxine 100 mcg p.o. daily  History of PSVT -- Carvedilol 6.25 mg p.o. twice daily -- Amiodarone 100 mg p.o. daily -- Monitor on telemetry  PVD -- Aspirin 81 mg p.o. daily -- Continue statin  CKD stage IIIa -- Creatinine 1.16; stable. -- BMP daily while on IV diuresis   DVT prophylaxis: enoxaparin (LOVENOX) injection 40 mg Start: 03/22/22 1000    Code Status: DNR Family Communication: No family present at bedside this morning  Disposition Plan:  Level of care: Progressive Status is: Inpatient Remains inpatient appropriate because: Needs further IV diuresis, titration off of supplemental oxygen, anticipate discharge home in 2-3 days    Consultants:  None  Procedures:  BiPAP  Antimicrobials:  Ceftriaxone 12/27   Subjective: Patient seen examined bedside, resting comfortably in the ED holding area.  Now titrated off of BiPAP this morning.  Continues with shortness of breath but reports much improved after receiving IV Lasix.  Reports good urine output.  No other specific questions or concerns at this time.  Denies headache, no fever/chills/night sweats, no nausea/vomiting/diarrhea, no chest pain, no  palpitations, no abdominal pain, no urinary symptoms, no focal weakness, no fatigue, no paresthesias.  No acute concerns overnight per nursing staff.  Objective: Vitals:   03/22/22 0800 03/22/22 1000 03/22/22 1200 03/22/22 1400  BP: (!) 123/54 (!) 101/54 (!) 122/94 (!) 130/102  Pulse: 68 76 72 83  Resp: 18 17 (!) 23 19  Temp:      TempSrc:      SpO2: 100% 98% 98% 96%    Intake/Output Summary (Last 24 hours) at 03/22/2022 1802 Last data filed at 03/22/2022 1053 Gross per 24 hour  Intake --  Output 3100 ml  Net -3100 ml   There were no vitals filed for this visit.  Examination:  Physical Exam: GEN: NAD, alert and oriented x 3, elderly in appearance HEENT: NCAT, PERRL, EOMI, sclera clear, MMM PULM: Breath sounds diminished bilateral bases with wheezes/crackles, normal [place]paire effort without accessory muscle use, on 3 L nasal cannula with SpO2 100% at rest CV: RRR w/o M/G/R GI: abd soft, NTND, NABS, no R/G/M MSK: + bilateral lower extremity peripheral edema, moves all extremities independently NEURO: CN II-XII intact, no focal deficits, sensation to light touch intact PSYCH: normal mood/affect Integumentary: dry/intact, no rashes or wounds    Data Reviewed: I have personally reviewed following labs and imaging studies  CBC: Recent Labs  Lab 03/21/22 2225 03/22/22 0442  WBC 7.6 6.2  NEUTROABS 4.4  --   HGB 8.9* 8.5*  HCT 33.3* 31.6*  MCV 82.6 82.1  PLT 336 268   Basic Metabolic Panel: Recent Labs  Lab 03/16/22 0628 03/21/22 2225 03/22/22 0442  NA 143 143 142  K 4.4 4.5 4.7  CL 105 114* 109  CO2 30 23 21*  GLUCOSE 145* 173* 186*  BUN 29* 30* 30*  CREATININE 1.08* 1.07* 1.16*  CALCIUM 9.6 9.6 9.5   GFR: Estimated Creatinine Clearance: 36.3 mL/min (A) (by C-G formula based on SCr of 1.16 mg/dL (H)). Liver Function Tests: Recent Labs  Lab 03/21/22 2225  AST 21  ALT 16  ALKPHOS 93  BILITOT 0.5  PROT 7.4  ALBUMIN 3.7   No results for input(s):  "LIPASE", "AMYLASE" in the last 168 hours. No results for input(s): "AMMONIA" in the last 168 hours. Coagulation Profile: No results for input(s): "INR", "PROTIME" in the last 168 hours. Cardiac Enzymes: No results for input(s): "CKTOTAL", "CKMB", "CKMBINDEX", "TROPONINI" in the last 168 hours. BNP (last 3 results) No results for input(s): "PROBNP" in the last 8760 hours. HbA1C: No results for input(s): "HGBA1C" in the last 72 hours. CBG: Recent Labs  Lab 03/15/22 2157 03/16/22 0730 03/22/22 0724 03/22/22 1154 03/22/22 1643  GLUCAP 161* 136* 193* 230* 190*   Lipid Profile: No results for input(s): "CHOL", "HDL", "LDLCALC", "TRIG", "CHOLHDL", "LDLDIRECT" in the last 72 hours. Thyroid Function Tests: No results for input(s): "TSH", "T4TOTAL", "FREET4", "T3FREE", "THYROIDAB" in the last 72 hours. Anemia Panel: No results for input(s): "VITAMINB12", "FOLATE", "FERRITIN", "TIBC", "IRON", "RETICCTPCT" in the last 72 hours. Sepsis Labs: Recent Labs  Lab 03/21/22 2229  LATICACIDVEN 1.7    Recent Results (from the past 240 hour(s))  Resp panel by RT-PCR (RSV, Flu A&B, Covid) Anterior Nasal Swab     Status: None   Collection Time: 03/13/22  4:48 AM   Specimen: Anterior Nasal Swab  Result Value Ref Range Status   SARS  Coronavirus 2 by RT PCR NEGATIVE NEGATIVE Final    Comment: (NOTE) SARS-CoV-2 target nucleic acids are NOT DETECTED.  The SARS-CoV-2 RNA is generally detectable in upper respiratory specimens during the acute phase of infection. The lowest concentration of SARS-CoV-2 viral copies this assay can detect is 138 copies/mL. A negative result does not preclude SARS-Cov-2 infection and should not be used as the sole basis for treatment or other patient management decisions. A negative result may occur with  improper specimen collection/handling, submission of specimen other than nasopharyngeal swab, presence of viral mutation(s) within the areas targeted by this assay,  and inadequate number of viral copies(<138 copies/mL). A negative result must be combined with clinical observations, patient history, and epidemiological information. The expected result is Negative.  Fact Sheet for Patients:  EntrepreneurPulse.com.au  Fact Sheet for Healthcare Providers:  IncredibleEmployment.be  This test is no t yet approved or cleared by the [place] FDA and  has been authorized for detection and/or diagnosis of SARS-CoV-2 by FDA under an Emergency Use Authorization (EUA). This EUA will remain  in effect (meaning this test can be used) for the duration of the COVID-19 declaration under Section 564(b)(1) of the Act, 21 U.S.C.section 360bbb-3(b)(1), unless the authorization is terminated  or revoked sooner.       Influenza A by PCR NEGATIVE NEGATIVE Final   Influenza B by PCR NEGATIVE NEGATIVE Final    Comment: (NOTE) The Xpert Xpress SARS-CoV-2/FLU/RSV plus assay is intended as an aid in the diagnosis of influenza from Nasopharyngeal swab specimens and should not be used as a sole basis for treatment. Nasal washings and aspirates are unacceptable for Xpert Xpress SARS-CoV-2/FLU/RSV testing.  Fact Sheet for Patients: EntrepreneurPulse.com.au  Fact Sheet for Healthcare Providers: IncredibleEmployment.be  This test is not yet approved or cleared by the [place] FDA and has been authorized for detection and/or diagnosis of SARS-CoV-2 by FDA under an Emergency Use Authorization (EUA). This EUA will remain in effect (meaning this test can be used) for the duration of the COVID-19 declaration under Section 564(b)(1) of the Act, 21 U.S.C. section 360bbb-3(b)(1), unless the authorization is terminated or revoked.     Resp Syncytial Virus by PCR NEGATIVE NEGATIVE Final    Comment: (NOTE) Fact Sheet for Patients: EntrepreneurPulse.com.au  Fact Sheet for  Healthcare Providers: IncredibleEmployment.be  This test is not yet approved or cleared by the [place] FDA and has been authorized for detection and/or diagnosis of SARS-CoV-2 by FDA under an Emergency Use Authorization (EUA). This EUA will remain in effect (meaning this test can be used) for the duration of the COVID-19 declaration under Section 564(b)(1) of the Act, 21 U.S.C. section 360bbb-3(b)(1), unless the authorization is terminated or revoked.  Performed at [facility], [address] [place], [place] [address]   Blood culture (routine x 2)     Status: Abnormal   Collection Time: 03/13/22  6:10 AM   Specimen: Right Antecubital; Blood  Result Value Ref Range Status   Specimen Description   Final    RIGHT ANTECUBITAL Performed at [facility], [address], [place], [place] [address]    Special Requests   Final    BOTTLES DRAWN AEROBIC AND ANAEROBIC Blood Culture adequate volume Performed at [facility], [address] [place], [place] [address]    Culture  Setup Time   Final    Organism ID to follow GRAM POSITIVE COCCI IN BOTH AEROBIC AND ANAEROBIC BOTTLES CRITICAL RESULT CALLED TO, READ BACK BY  AND VERIFIED WITH: Rito Ehrlich 03/14/22 1044 KLW    Culture (A)  Final    STAPHYLOCOCCUS CAPITIS THE SIGNIFICANCE OF ISOLATING THIS ORGANISM FROM A SINGLE SET OF BLOOD CULTURES WHEN MULTIPLE SETS ARE DRAWN IS UNCERTAIN. PLEASE NOTIFY THE MICROBIOLOGY DEPARTMENT WITHIN ONE WEEK IF SPECIATION AND SENSITIVITIES ARE REQUIRED. Performed at [facility], [place] [address] [place], [place] [address]    Report Status 03/16/2022 FINAL  Final  Blood Culture ID Panel (Reflexed)     Status: Abnormal   Collection Time: 03/13/22  6:10 AM  Result Value Ref Range Status   Enterococcus faecalis NOT DETECTED NOT DETECTED Final   Enterococcus Faecium NOT DETECTED NOT DETECTED Final   Listeria monocytogenes NOT DETECTED NOT  DETECTED Final   Staphylococcus species DETECTED (A) NOT DETECTED Final    Comment: CRITICAL RESULT CALLED TO, READ BACK BY AND VERIFIED WITH: WALID NAZARI 03/14/22 1044 KLW    Staphylococcus aureus (BCID) NOT DETECTED NOT DETECTED Final   Staphylococcus epidermidis NOT DETECTED NOT DETECTED Final   Staphylococcus lugdunensis NOT DETECTED NOT DETECTED Final   Streptococcus species NOT DETECTED NOT DETECTED Final   Streptococcus agalactiae NOT DETECTED NOT DETECTED Final   Streptococcus pneumoniae NOT DETECTED NOT DETECTED Final   Streptococcus pyogenes NOT DETECTED NOT DETECTED Final   A.calcoaceticus-baumannii NOT DETECTED NOT DETECTED Final   Bacteroides fragilis NOT DETECTED NOT DETECTED Final   Enterobacterales NOT DETECTED NOT DETECTED Final   Enterobacter cloacae complex NOT DETECTED NOT DETECTED Final   Escherichia coli NOT DETECTED NOT DETECTED Final   Klebsiella aerogenes NOT DETECTED NOT DETECTED Final   Klebsiella oxytoca NOT DETECTED NOT DETECTED Final   Klebsiella pneumoniae NOT DETECTED NOT DETECTED Final   Proteus species NOT DETECTED NOT DETECTED Final   Salmonella species NOT DETECTED NOT DETECTED Final   Serratia marcescens NOT DETECTED NOT DETECTED Final   Haemophilus influenzae NOT DETECTED NOT DETECTED Final   Neisseria meningitidis NOT DETECTED NOT DETECTED Final   Pseudomonas aeruginosa NOT DETECTED NOT DETECTED Final   Stenotrophomonas maltophilia NOT DETECTED NOT DETECTED Final   Candida albicans NOT DETECTED NOT DETECTED Final   Candida auris NOT DETECTED NOT DETECTED Final   Candida glabrata NOT DETECTED NOT DETECTED Final   Candida krusei NOT DETECTED NOT DETECTED Final   Candida parapsilosis NOT DETECTED NOT DETECTED Final   Candida tropicalis NOT DETECTED NOT DETECTED Final   Cryptococcus neoformans/gattii NOT DETECTED NOT DETECTED Final    Comment: Performed at [facility], [address] [place], [place] [address]  Blood culture (routine  x 2)     Status: None   Collection Time: 03/13/22  7:20 AM   Specimen: BLOOD  Result Value Ref Range Status   Specimen Description BLOOD BLOOD RIGHT ARM  Final   Special Requests   Final    BOTTLES DRAWN AEROBIC AND ANAEROBIC Blood Culture adequate volume   Culture   Final    NO GROWTH 5 DAYS Performed at [facility], [address] [place], [place] [address]    Report Status 03/18/2022 FINAL  Final  Resp panel by RT-PCR (RSV, Flu A&B, Covid) Anterior Nasal Swab     Status: None   Collection Time: 03/21/22 10:26 PM   Specimen: Anterior Nasal Swab  Result Value Ref Range Status   SARS Coronavirus 2 by RT PCR NEGATIVE NEGATIVE Final    Comment: (NOTE) SARS-CoV-2 target nucleic acids are NOT DETECTED.  The SARS-CoV-2 RNA is generally detectable in upper respiratory  specimens during the acute phase of infection. The lowest concentration of SARS-CoV-2 viral copies this assay can detect is 138 copies/mL. A negative result does not preclude SARS-Cov-2 infection and should not be used as the sole basis for treatment or other patient management decisions. A negative result may occur with  improper specimen collection/handling, submission of specimen other than nasopharyngeal swab, presence of viral mutation(s) within the areas targeted by this assay, and inadequate number of viral copies(<138 copies/mL). A negative result must be combined with clinical observations, patient history, and epidemiological information. The expected result is Negative.  Fact Sheet for Patients:  EntrepreneurPulse.com.au  Fact Sheet for Healthcare Providers:  IncredibleEmployment.be  This test is no t yet approved or cleared by the [place] FDA and  has been authorized for detection and/or diagnosis of SARS-CoV-2 by FDA under an Emergency Use Authorization (EUA). This EUA will remain  in effect (meaning this test can be used) for the duration of  the COVID-19 declaration under Section 564(b)(1) of the Act, 21 U.S.C.section 360bbb-3(b)(1), unless the authorization is terminated  or revoked sooner.       Influenza A by PCR NEGATIVE NEGATIVE Final   Influenza B by PCR NEGATIVE NEGATIVE Final    Comment: (NOTE) The Xpert Xpress SARS-CoV-2/FLU/RSV plus assay is intended as an aid in the diagnosis of influenza from Nasopharyngeal swab specimens and should not be used as a sole basis for treatment. Nasal washings and aspirates are unacceptable for Xpert Xpress SARS-CoV-2/FLU/RSV testing.  Fact Sheet for Patients: EntrepreneurPulse.com.au  Fact Sheet for Healthcare Providers: IncredibleEmployment.be  This test is not yet approved or cleared by the [place] FDA and has been authorized for detection and/or diagnosis of SARS-CoV-2 by FDA under an Emergency Use Authorization (EUA). This EUA will remain in effect (meaning this test can be used) for the duration of the COVID-19 declaration under Section 564(b)(1) of the Act, 21 U.S.C. section 360bbb-3(b)(1), unless the authorization is terminated or revoked.     Resp Syncytial Virus by PCR NEGATIVE NEGATIVE Final    Comment: (NOTE) Fact Sheet for Patients: EntrepreneurPulse.com.au  Fact Sheet for Healthcare Providers: IncredibleEmployment.be  This test is not yet approved or cleared by the [place] FDA and has been authorized for detection and/or diagnosis of SARS-CoV-2 by FDA under an Emergency Use Authorization (EUA). This EUA will remain in effect (meaning this test can be used) for the duration of the COVID-19 declaration under Section 564(b)(1) of the Act, 21 U.S.C. section 360bbb-3(b)(1), unless the authorization is terminated or revoked.  Performed at [facility], [address], [place], [place] [address]          Radiology Studies: DG Chest Portable 1 View  Result  Date: 03/21/2022 CLINICAL DATA:  Shortness of breath EXAM: PORTABLE CHEST 1 VIEW COMPARISON:  03/13/2022 FINDINGS: Stable cardiomegaly. Aortic atherosclerotic calcification. Increased pulmonary vascular congestion and interstitial opacities with a lower lung predominance. Question small left pleural effusion. No pneumothorax. Advanced arthritis both shoulders IMPRESSION: Interval increase in predominantly interstitial opacities in the lower lungs. This is nonspecific and may be due to worsening pneumonia or edema. Cardiomegaly. Aortic Atherosclerosis (ICD10-I70.0). Electronically Signed   By: Placido Sou M.D.   On: 03/21/2022 22:48        Scheduled Meds:  amiodarone  100 mg Oral Daily   aspirin EC  81 mg Oral Daily   atorvastatin  40 mg Oral Daily   carvedilol  6.25 mg Oral BID WC   dapagliflozin propanediol  10  mg Oral Daily   enoxaparin (LOVENOX) injection  40 mg Subcutaneous Q24H   furosemide  40 mg Intravenous Q12H   gabapentin  400 mg Oral QID   guaiFENesin  600 mg Oral BID   insulin aspart  0-5 Units Subcutaneous QHS   insulin aspart  0-9 Units Subcutaneous TID WC   ipratropium-albuterol  3 mL Nebulization QID   levothyroxine  100 mcg Oral Q0600   lidocaine  1 patch Transdermal Q24H   losartan  25 mg Oral QPM   methylPREDNISolone (SOLU-MEDROL) injection  40 mg Intravenous Q12H   pantoprazole  40 mg Oral Daily   tiotropium  18 mcg Inhalation Daily   Continuous Infusions:  cefTRIAXone (ROCEPHIN)  IV Stopped (03/22/22 0209)     LOS: 1 day    Time spent: 52 minutes spent on chart review, discussion with nursing staff, consultants, updating family and interview/physical exam; more than 50% of that time was spent in counseling and/or coordination of care.    Pegeen Stiger J [place] DO [facility] Available via Epic secure chat 7am-7pm After these hours, please refer to coverage provider listed on [email or web address] 03/22/2022, 6:02 PM

## 2022-03-23 DIAGNOSIS — J9601 Acute respiratory failure with hypoxia: Secondary | ICD-10-CM | POA: Diagnosis not present

## 2022-03-23 LAB — BASIC METABOLIC PANEL
Anion gap: 10 (ref 5–15)
BUN: 41 mg/dL — ABNORMAL HIGH (ref 8–23)
CO2: 24 mmol/L (ref 22–32)
Calcium: 9.1 mg/dL (ref 8.9–10.3)
Chloride: 105 mmol/L (ref 98–111)
Creatinine, Ser: 1.27 mg/dL — ABNORMAL HIGH (ref 0.44–1.00)
GFR, Estimated: 41 mL/min — ABNORMAL LOW (ref >60)
Glucose, Bld: 215 mg/dL — ABNORMAL HIGH (ref 70–99)
Potassium: 4.1 mmol/L (ref 3.5–5.1)
Sodium: 139 mmol/L (ref 135–145)

## 2022-03-23 LAB — GLUCOSE, CAPILLARY
Glucose-Capillary: 182 mg/dL — ABNORMAL HIGH (ref 70–99)
Glucose-Capillary: 291 mg/dL — ABNORMAL HIGH (ref 70–99)
Glucose-Capillary: 228 mg/dL — ABNORMAL HIGH (ref 70–99)
Glucose-Capillary: 252 mg/dL — ABNORMAL HIGH (ref 70–99)

## 2022-03-23 LAB — MAGNESIUM: Magnesium: 2.3 mg/dL (ref 1.7–2.4)

## 2022-03-23 MED ORDER — TORSEMIDE 20 MG PO TABS
20.0000 mg | ORAL_TABLET | Freq: Every day | ORAL | Status: DC
  Administered 2022-03-24: 20 mg via ORAL
  Filled 2022-03-23: qty 1

## 2022-03-23 MED ORDER — IPRATROPIUM-ALBUTEROL 0.5-2.5 (3) MG/3ML IN SOLN
3.0000 mL | Freq: Three times a day (TID) | RESPIRATORY_TRACT | Status: DC
  Administered 2022-03-23 – 2022-03-24 (×3): 3 mL via RESPIRATORY_TRACT
  Filled 2022-03-23 (×3): qty 3

## 2022-03-23 MED ORDER — FUROSEMIDE 40 MG PO TABS
40.0000 mg | ORAL_TABLET | Freq: Every day | ORAL | Status: DC
  Administered 2022-03-23: 40 mg via ORAL
  Filled 2022-03-23: qty 1

## 2022-03-23 MED ORDER — SODIUM CHLORIDE 0.9 % IV SOLN: INTRAVENOUS | Status: DC | PRN

## 2022-03-23 MED ORDER — PREDNISONE 20 MG PO TABS
40.0000 mg | ORAL_TABLET | Freq: Every day | ORAL | Status: DC
  Administered 2022-03-24: 40 mg via ORAL
  Filled 2022-03-23: qty 2

## 2022-03-23 NOTE — Care Management Important Message (Signed)
Important Message  Patient Details  Name: Kathryn [name] MRN: [id number] Date of Birth: [birth year]   Medicare Important Message Given:  N/A - LOS <3 / Initial given by admissions     Dannette Barbara 03/23/2022, 9:25 AM

## 2022-03-23 NOTE — Progress Notes (Signed)
Physical Therapy Treatment Patient Details Name: Kathryn [name] MRN: [id number] DOB: 21-Aug-1933 Today's Date: 03/23/2022   History of Present Illness Kathryn [name] is an 89yoF  presented to ED c SOB- of note, recent admission for COPD and CHF exacerbation, inititally better but has had progressive dyspnea day of admission. PMH: CHF, COPD, CAD, HTN, CKD, PVD. Pt admitted with volume overload, elevated BNP.    PT Comments    Pt advances AMB distance to >336f, improved speed and improved tolerance. O2 sats after walking 95% on room air with her typical degree of DOE. Pt is pleased with improvement looking forward to going home, asking about obtaining a RW for home. Will continue to follow.     Recommendations for follow up therapy are one component of a multi-disciplinary discharge planning process, led by the attending physician.  Recommendations may be updated based on patient status, additional functional criteria and insurance authorization.  Follow Up Recommendations  Home health PT     Assistance Recommended at Discharge Set up Supervision/Assistance  Patient can return home with the following A little help with walking and/or transfers;Help with stairs or ramp for entrance;Assist for transportation;Assistance with cooking/housework   Equipment Recommendations  Rolling walker (2 wheels)    Recommendations for Other Services       Precautions / Restrictions Precautions Precautions: Fall Restrictions Weight Bearing Restrictions: No     Mobility  Bed Mobility Overal bed mobility: Independent                  Transfers Overall transfer level: Modified independent Equipment used: Rolling walker (2 wheels) Transfers: Sit to/from Stand Sit to Stand: Supervision                Ambulation/Gait Ambulation/Gait assistance: Supervision Gait Distance (Feet): 350 Feet Assistive device: Rolling walker (2 wheels)   Gait velocity: 0.62m     General Gait  Details: moving well, feels better, author keeps diaper in place. AMB on room air, DOE after 25032fterminal sats 95% SpO2.   Stairs             Wheelchair Mobility    Modified Rankin (Stroke Patients Only)       Balance Overall balance assessment: Modified Independent                                          Cognition Arousal/Alertness: Awake/alert Behavior During Therapy: WFL for tasks assessed/performed Overall Cognitive Status: Within Functional Limits for tasks assessed                                          Exercises      General Comments        Pertinent Vitals/Pain Pain Assessment Pain Assessment: No/denies pain    Home Living                          Prior Function            PT Goals (current goals can now be found in the care plan section) Acute Rehab PT Goals Patient Stated Goal: to go home PT Goal Formulation: With patient Time For Goal Achievement: 03/28/22 Potential to Achieve Goals: Good Progress towards PT goals: Progressing toward goals    Frequency  Min 2X/week      PT Plan Current plan remains appropriate    Co-evaluation              AM-PAC PT "6 Clicks" Mobility   Outcome Measure  Help needed turning from your back to your side while in a flat bed without using bedrails?: None Help needed moving from lying on your back to sitting on the side of a flat bed without using bedrails?: None Help needed moving to and from a bed to a chair (including a wheelchair)?: A Little Help needed standing up from a chair using your arms (e.g., wheelchair or bedside chair)?: A Little Help needed to walk in hospital room?: A Little Help needed climbing 3-5 steps with a railing? : A Little 6 Click Score: 20    End of Session   Activity Tolerance: Patient tolerated treatment well;No increased pain;Patient limited by fatigue Patient left: with call bell/phone within reach;in  bed Nurse Communication: Mobility status PT Visit Diagnosis: Unsteadiness on feet (R26.81);Muscle weakness (generalized) (M62.81);History of falling (Z91.81)     Time: 5916-3846 PT Time Calculation (min) (ACUTE ONLY): 20 min  Charges:  $Therapeutic Activity: 8-22 mins                    11:01 AM, 03/23/22 Kathryn [name], PT, DPT Physical Therapist - [facility]  [phone] ([place])    Kathryn [name] C 03/23/2022, 11:00 AM

## 2022-03-23 NOTE — Progress Notes (Signed)
PROGRESS NOTE    Kathryn [name]  KKX:[id number] DOB: [birth year] DOA: 03/21/2022 PCP: [facility]    Brief Narrative:   Kathryn [name] is a 88 y.o. female with past medical history significant for chronic systolic and diastolic congestive heart failure, nonischemic cardiomyopathy, COPD, type 2 diabetes mellitus, peptic ulcer disease, HTN, HLD, hypothyroidism, left bundle branch block, PSVT, PVD who presented to [facility] ED on 12/27 with acute onset shortness of breath, wheezing and lower extremity edema.  Patient reports dyspnea worse on exertion without chest pain or palpitations.  Denies nausea/vomiting, no abdominal pain, no urinary symptoms.  On EMS arrival patient was noted to be in significant respiratory distress and was placed on nonrebreather followed by CPAP and was given 1.5 inch of Nitropaste, 2 DuoNebs and route to the ED.  In the ED, temperature 98.4 F, HR 83, RR 26, BP 143/58, SpO2 70%. WBC 7.6, hemoglobin 8.9, platelets 336.  Sodium 143, potassium 4.5, chloride 114, CO2 23, glucose 173, BUN 30, creatinine 1.07.  AST 21, ALT 16, total bilirubin 0.5.  BNP 676.4.  High sensitive troponin 9>10.  Covid-19 PCR negative.  RSV PCR negative.  Influenza A/B PCR negative.  Chest x-ray with interval increase interstitial opacities in lower lungs likely secondary to pulmonary edema versus pneumonia, noted cardiomegaly.  Patient was continued on BiPAP, started on IV furosemide.  TRH consulted for admission for further evaluation management of acute hypoxic respite failure secondary to CHF exacerbation.  Assessment & Plan:   Acute hypoxic respiratory failure, POA Acute on chronic combined systolic/diastolic congestive heart failure exacerbation Patient presenting from home with acute onset shortness of breath associated with lower extremity edema.  Patient is afebrile without leukocytosis.  Elevated BNP and chest x-ray findings consistent with volume overload.  Initially requiring  BiPAP which has since been titrated off. TTE January 2022 with LVEF 16-96%, grade 2 diastolic dysfunction. -- net negative 5.2L since admission -- Transition IV furosemide to 40 mg PO daily -- Fluid restriction 1500 mL/day -- Strict I's and O's and daily weights -- Continue supplemental oxygen, maintain SpO2 greater than 88%; now on room air -- BMP daily  COPD exacerbation -- Solu-Medrol 40 mg IV every 12 hours; transition to prednisone 40 mg p.o. daily tomorrow -- Ceftriaxone 1 g IV every 24 hours -- DuoNebs 4 times daily -- Spiriva daily  Type 2 diabetes mellitus Hemoglobin A1c 6.9 on 12/12/2021, well-controlled.  On Farxiga and metformin outpatient; will hold during hospitalization --SSI for coverge --CBGs qAC/HS  Peptic ulcer disease -- Protonix 40 mg p.o. daily  HTN -- Losartan 25 mg p.o. daily  HLD -- Atorvastatin 40 mg p.o. daily  Hypothyroidism -- Levothyroxine 100 mcg p.o. daily  History of PSVT -- Carvedilol 6.25 mg p.o. twice daily -- Amiodarone 100 mg p.o. daily -- Monitor on telemetry  PVD -- Aspirin 81 mg p.o. daily -- Continue statin  CKD stage IIIa -- Creatinine 1.16>1.27; stable. -- BMP daily; transition to oral diuretic as above   DVT prophylaxis: enoxaparin (LOVENOX) injection 40 mg Start: 03/22/22 1000    Code Status: DNR Family Communication: No family present at bedside this morning  Disposition Plan:  Level of care: Progressive Status is: Inpatient Remains inpatient appropriate because: Transitioning to oral diuretic as above,  anticipate discharge home likely tomorrow    Consultants:  None  Procedures:  BiPAP  Antimicrobials:  Ceftriaxone 12/27   Subjective: Patient seen examined bedside, lying in bed.  Talking on telephone.  Patient's oxygen  has been off for some time, check at bedside notable for 93%.  Overall feels her breathing is much better.  Discussed transitioning from IV Lasix to oral Lasix today.  Patient reports that  she was told during her recent admission to stop Lasix, discussed with her that she needs to be on this on a daily basis due to her heart failure.  No other questions or concerns at this time. Denies headache, no fever/chills/night sweats, no nausea/vomiting/diarrhea, no chest pain, no palpitations, no abdominal pain, no urinary symptoms, no focal weakness, no fatigue, no paresthesias.  No acute concerns overnight per nursing staff.  Objective: Vitals:   03/23/22 0014 03/23/22 0427 03/23/22 0739 03/23/22 0805  BP: (!) 154/64 131/60 128/63   Pulse: 76 77 65   Resp: '20 20 18   '$ Temp: 97.9 F (36.6 C) 97.9 F (36.6 C) 98.1 F (36.7 C)   TempSrc:   Oral   SpO2: 100% 99% 100% 97%  Weight:      Height:        Intake/Output Summary (Last 24 hours) at 03/23/2022 1038 Last data filed at 03/23/2022 0958 Gross per 24 hour  Intake --  Output 3770 ml  Net -3770 ml   Filed Weights   03/22/22 2126  Weight: 83.1 kg    Examination:  Physical Exam: GEN: NAD, alert and oriented x 3, elderly in appearance HEENT: NCAT, PERRL, EOMI, sclera clear, MMM PULM: Breath sounds diminished bilateral bases with wheezes/crackles, normal respiratory effort without accessory muscle use, on room air at rest with SpO2 93%, CV: RRR w/o M/G/R GI: abd soft, NTND, NABS, no R/G/M MSK: + Trace bilateral lower extremity peripheral edema, moves all extremities independently NEURO: CN II-XII intact, no focal deficits, sensation to light touch intact PSYCH: normal mood/affect Integumentary: Various wounds noted on bilateral lower extremities anterior shins in various stages of healing, otherwise no other concerning rashes/lesions/wounds noted on exposed skin surfaces.     Data Reviewed: I have personally reviewed following labs and imaging studies  CBC: Recent Labs  Lab 03/21/22 2225 03/22/22 0442  WBC 7.6 6.2  NEUTROABS 4.4  --   HGB 8.9* 8.5*  HCT 33.3* 31.6*  MCV 82.6 82.1  PLT 336 024   Basic  Metabolic Panel: Recent Labs  Lab 03/21/22 2225 03/22/22 0442 03/23/22 0441  NA 143 142 139  K 4.5 4.7 4.1  CL 114* 109 105  CO2 23 21* 24  GLUCOSE 173* 186* 215*  BUN 30* 30* 41*  CREATININE 1.07* 1.16* 1.27*  CALCIUM 9.6 9.5 9.1  MG  --   --  2.3   GFR: Estimated Creatinine Clearance: 33.2 mL/min (A) (by C-G formula based on SCr of 1.27 mg/dL (H)). Liver Function Tests: Recent Labs  Lab 03/21/22 2225  AST 21  ALT 16  ALKPHOS 93  BILITOT 0.5  PROT 7.4  ALBUMIN 3.7   No results for input(s): "LIPASE", "AMYLASE" in the last 168 hours. No results for input(s): "AMMONIA" in the last 168 hours. Coagulation Profile: No results for input(s): "INR", "PROTIME" in the last 168 hours. Cardiac Enzymes: No results for input(s): "CKTOTAL", "CKMB", "CKMBINDEX", "TROPONINI" in the last 168 hours. BNP (last 3 results) No results for input(s): "PROBNP" in the last 8760 hours. HbA1C: No results for input(s): "HGBA1C" in the last 72 hours. CBG: Recent Labs  Lab 03/22/22 0724 03/22/22 1154 03/22/22 1643 03/22/22 2114 03/23/22 0738  GLUCAP 193* 230* 190* 210* 182*   Lipid Profile: No results for input(s): "CHOL", "  HDL", "LDLCALC", "TRIG", "CHOLHDL", "LDLDIRECT" in the last 72 hours. Thyroid Function Tests: No results for input(s): "TSH", "T4TOTAL", "FREET4", "T3FREE", "THYROIDAB" in the last 72 hours. Anemia Panel: No results for input(s): "VITAMINB12", "FOLATE", "FERRITIN", "TIBC", "IRON", "RETICCTPCT" in the last 72 hours. Sepsis Labs: Recent Labs  Lab 03/21/22 2229  LATICACIDVEN 1.7    Recent Results (from the past 240 hour(s))  Resp panel by RT-PCR (RSV, Flu A&B, Covid) Anterior Nasal Swab     Status: None   Collection Time: 03/21/22 10:26 PM   Specimen: Anterior Nasal Swab  Result Value Ref Range Status   SARS Coronavirus 2 by RT PCR NEGATIVE NEGATIVE Final    Comment: (NOTE) SARS-CoV-2 target nucleic acids are NOT DETECTED.  The SARS-CoV-2 RNA is generally  detectable in upper respiratory specimens during the acute phase of infection. The lowest concentration of SARS-CoV-2 viral copies this assay can detect is 138 copies/mL. A negative result does not preclude SARS-Cov-2 infection and should not be used as the sole basis for treatment or other patient management decisions. A negative result may occur with  improper specimen collection/handling, submission of specimen other than nasopharyngeal swab, presence of viral mutation(s) within the areas targeted by this assay, and inadequate number of viral copies(<138 copies/mL). A negative result must be combined with clinical observations, patient history, and epidemiological information. The expected result is Negative.  Fact Sheet for Patients:  EntrepreneurPulse.com.au  Fact Sheet for Healthcare Providers:  IncredibleEmployment.be  This test is no t yet approved or cleared by the [place] FDA and  has been authorized for detection and/or diagnosis of SARS-CoV-2 by FDA under an Emergency Use Authorization (EUA). This EUA will remain  in effect (meaning this test can be used) for the duration of the COVID-19 declaration under Section 564(b)(1) of the Act, 21 U.S.C.section 360bbb-3(b)(1), unless the authorization is terminated  or revoked sooner.       Influenza A by PCR NEGATIVE NEGATIVE Final   Influenza B by PCR NEGATIVE NEGATIVE Final    Comment: (NOTE) The Xpert Xpress SARS-CoV-2/FLU/RSV plus assay is intended as an aid in the diagnosis of influenza from Nasopharyngeal swab specimens and should not be used as a sole basis for treatment. Nasal washings and aspirates are unacceptable for Xpert Xpress SARS-CoV-2/FLU/RSV testing.  Fact Sheet for Patients: EntrepreneurPulse.com.au  Fact Sheet for Healthcare Providers: IncredibleEmployment.be  This test is not yet approved or cleared by the [place] FDA  and has been authorized for detection and/or diagnosis of SARS-CoV-2 by FDA under an Emergency Use Authorization (EUA). This EUA will remain in effect (meaning this test can be used) for the duration of the COVID-19 declaration under Section 564(b)(1) of the Act, 21 U.S.C. section 360bbb-3(b)(1), unless the authorization is terminated or revoked.     Resp Syncytial Virus by PCR NEGATIVE NEGATIVE Final    Comment: (NOTE) Fact Sheet for Patients: EntrepreneurPulse.com.au  Fact Sheet for Healthcare Providers: IncredibleEmployment.be  This test is not yet approved or cleared by the [place] FDA and has been authorized for detection and/or diagnosis of SARS-CoV-2 by FDA under an Emergency Use Authorization (EUA). This EUA will remain in effect (meaning this test can be used) for the duration of the COVID-19 declaration under Section 564(b)(1) of the Act, 21 U.S.C. section 360bbb-3(b)(1), unless the authorization is terminated or revoked.  Performed at [facility], [address], [place], [place] [address]          Radiology Studies: DG Chest Portable 1 View  Result Date: 03/21/2022 CLINICAL DATA:  Shortness of breath EXAM: PORTABLE CHEST 1 VIEW COMPARISON:  03/13/2022 FINDINGS: Stable cardiomegaly. Aortic atherosclerotic calcification. Increased pulmonary vascular congestion and interstitial opacities with a lower lung predominance. Question small left pleural effusion. No pneumothorax. Advanced arthritis both shoulders IMPRESSION: Interval increase in predominantly interstitial opacities in the lower lungs. This is nonspecific and may be due to worsening pneumonia or edema. Cardiomegaly. Aortic Atherosclerosis (ICD10-I70.0). Electronically Signed   By: Placido Sou M.D.   On: 03/21/2022 22:48        Scheduled Meds:  amiodarone  100 mg Oral Daily   aspirin EC  81 mg Oral Daily   atorvastatin  40 mg Oral Daily    carvedilol  6.25 mg Oral BID WC   dapagliflozin propanediol  10 mg Oral Daily   enoxaparin (LOVENOX) injection  40 mg Subcutaneous Q24H   furosemide  40 mg Oral Daily   gabapentin  400 mg Oral QID   guaiFENesin  600 mg Oral BID   insulin aspart  0-5 Units Subcutaneous QHS   insulin aspart  0-9 Units Subcutaneous TID WC   ipratropium-albuterol  3 mL Nebulization TID   levothyroxine  100 mcg Oral Q0600   lidocaine  1 patch Transdermal Q24H   losartan  25 mg Oral QPM   methylPREDNISolone (SOLU-MEDROL) injection  40 mg Intravenous Q12H   pantoprazole  40 mg Oral Daily   tiotropium  18 mcg Inhalation Daily   Continuous Infusions:  sodium chloride 10 mL/hr at 03/23/22 0128   cefTRIAXone (ROCEPHIN)  IV Stopped (03/23/22 0700)     LOS: 2 days    Time spent: 52 minutes spent on chart review, discussion with nursing staff, consultants, updating family and interview/physical exam; more than 50% of that time was spent in counseling and/or coordination of care.    Raniah Karan J [place] DO [facility] Available via Epic secure chat 7am-7pm After these hours, please refer to coverage provider listed on [email or web address] 03/23/2022, 10:38 AM

## 2022-03-23 NOTE — Discharge Instructions (Addendum)

## 2022-03-23 NOTE — Inpatient Diabetes Management (Signed)
Inpatient Diabetes Program Recommendations  AACE/ADA: New Consensus Statement on Inpatient Glycemic Control   Target Ranges:  Prepandial:   less than 140 mg/dL      Peak postprandial:   less than 180 mg/dL (1-2 hours)      Critically ill patients:  140 - 180 mg/dL    Latest Reference Range & Units 03/23/22 07:38 03/23/22 11:40  Glucose-Capillary 70 - 99 mg/dL 182 (H) 291 (H)    Latest Reference Range & Units 03/22/22 07:24 03/22/22 11:54 03/22/22 16:43 03/22/22 21:14  Glucose-Capillary 70 - 99 mg/dL 193 (H) 230 (H) 190 (H) 210 (H)   Review of Glycemic Control  Diabetes history: DM2 Outpatient Diabetes medications: Farxiga 10 mg daily, Metformin 1000 mg QPM Current orders for Inpatient glycemic control: Farxiga 10 mg daily, Novolog 0-9 units TID with meals, Novolog 0-5 units QHS; Solumedrol 40 mg QAM, changing to Prednisone 40 mg QAM on 03/24/22  Inpatient Diabetes Program Recommendations:    Insulin: If steroids are continued, please consider ordering Novolog 2 units TID with meals for meal coverage if patient eats at least 50% of meals.  Thanks, Barnie Alderman, RN, MSN, [place] [facility] Coordinator Inpatient Diabetes Program [phone] (Team Pager from 8am to [place])

## 2022-03-23 NOTE — Telephone Encounter (Signed)
Per [name] is just an FYI for the provider

## 2022-03-23 NOTE — TOC Progression Note (Signed)
Transition of Care [facility]) - Progression Note    Patient Details  Name: AKAILA RAMBO MRN: [id number] Date of Birth: 05-16-1933  Transition of Care [facility]) CM/SW Contact  Gerilyn Pilgrim, LCSW Phone Number: 03/23/2022, 1:13 PM  Clinical Narrative:   Orders in for [facility] PT/RN. Pt [facility] with [facility]. Pt on acute oxygen- trying to wean. CSW will continue to follow.     Expected Discharge Plan: [place] Barriers to Discharge: Barriers Resolved  Expected Discharge Plan and Services     Post Acute Care Choice: Resumption of Svcs/PTA Provider Living arrangements for the past 2 months: [place]: PT [place]: [place] ([place]) Date [place]: 03/22/22   Representative spoke with at [place]: Floydene Flock   Social Determinants of Health (SDOH) Interventions [place]: No Food Insecurity (03/23/2022)  Housing: Low Risk  (03/23/2022)  Transportation Needs: No Transportation Needs (03/23/2022)  Utilities: Not At Risk (03/23/2022)  Alcohol Screen: Low Risk  (11/16/2021)  Depression (PHQ2-9): Medium Risk (11/16/2021)  Tobacco Use: Medium Risk (03/21/2022)    Readmission Risk Interventions    05/27/2020   12:12 PM  Readmission Risk Prevention Plan  Transportation Screening Complete  PCP or Specialist Appt within 5-7 Days Complete  Home Care Screening Complete  Medication Review (RN CM) Complete

## 2022-03-24 DIAGNOSIS — J9601 Acute respiratory failure with hypoxia: Secondary | ICD-10-CM | POA: Diagnosis not present

## 2022-03-24 LAB — BASIC METABOLIC PANEL
Anion gap: 10 (ref 5–15)
BUN: 40 mg/dL — ABNORMAL HIGH (ref 8–23)
CO2: 27 mmol/L (ref 22–32)
Calcium: 9.3 mg/dL (ref 8.9–10.3)
Chloride: 104 mmol/L (ref 98–111)
Creatinine, Ser: 1.07 mg/dL — ABNORMAL HIGH (ref 0.44–1.00)
GFR, Estimated: 51 mL/min — ABNORMAL LOW (ref >60)
Glucose, Bld: 195 mg/dL — ABNORMAL HIGH (ref 70–99)
Potassium: 4 mmol/L (ref 3.5–5.1)
Sodium: 141 mmol/L (ref 135–145)

## 2022-03-24 LAB — GLUCOSE, CAPILLARY
Glucose-Capillary: 200 mg/dL — ABNORMAL HIGH (ref 70–99)
Glucose-Capillary: 212 mg/dL — ABNORMAL HIGH (ref 70–99)

## 2022-03-24 MED ORDER — IPRATROPIUM-ALBUTEROL 0.5-2.5 (3) MG/3ML IN SOLN: 3.0000 mL | RESPIRATORY_TRACT | Status: DC | PRN

## 2022-03-24 MED ORDER — TORSEMIDE 20 MG PO TABS: 20.0000 mg | ORAL_TABLET | Freq: Every day | ORAL | Status: DC

## 2022-03-24 MED ORDER — GABAPENTIN 300 MG PO CAPS
300.0000 mg | ORAL_CAPSULE | Freq: Three times a day (TID) | ORAL | Status: DC
  Administered 2022-03-24: 300 mg via ORAL
  Filled 2022-03-24: qty 1

## 2022-03-24 NOTE — Discharge Summary (Signed)
Physician Discharge Summary  Kathryn [name] ZOX:[id number] DOB: December 28, 1933 DOA: 03/21/2022  PCP: [place] date: 03/21/2022 Discharge date: 03/24/2022  Admitted From: Home Disposition: Home  Recommendations for Outpatient Follow-up:  Follow up with PCP in 1-2 weeks Restarted home torsemide 20 mg p.o. daily Urged to maintain daily weight log and bring to next PCP visit Continue to encourage low-salt diet Recommend repeat BMP 1 week to follow renal function.  Home Health: PT/RN Equipment/Devices: None  Discharge Condition: Stable CODE STATUS: DNR Diet recommendation: Heart healthy/consistent carb regular diet  History of present illness:  Kathryn [name] is a 88 y.o. female with past medical history significant for chronic systolic and diastolic congestive heart failure, nonischemic cardiomyopathy, COPD, type 2 diabetes mellitus, peptic ulcer disease, HTN, HLD, hypothyroidism, left bundle branch block, PSVT, PVD who presented to [facility] ED on 12/27 with acute onset shortness of breath, wheezing and lower extremity edema.  Patient reports dyspnea worse on exertion without chest pain or palpitations.  Denies nausea/vomiting, no abdominal pain, no urinary symptoms.  On EMS arrival patient was noted to be in significant respiratory distress and was placed on nonrebreather followed by CPAP and was given 1.5 inch of Nitropaste, 2 DuoNebs and route to the ED.   In the ED, temperature 98.4 F, HR 83, RR 26, BP 143/58, SpO2 70%. WBC 7.6, hemoglobin 8.9, platelets 336.  Sodium 143, potassium 4.5, chloride 114, CO2 23, glucose 173, BUN 30, creatinine 1.07.  AST 21, ALT 16, total bilirubin 0.5.  BNP 676.4.  High sensitive troponin 9>10.  Covid-19 PCR negative.  RSV PCR negative.  Influenza A/B PCR negative.  Chest x-ray with interval increase interstitial opacities in lower lungs likely secondary to pulmonary edema versus pneumonia, noted cardiomegaly.  Patient was continued on  BiPAP, started on IV furosemide.  TRH consulted for admission for further evaluation management of acute hypoxic respite failure secondary to CHF exacerbation.  Hospital course:  Acute hypoxic respiratory failure, POA Acute on chronic combined systolic/diastolic congestive heart failure exacerbation Patient presenting from home with acute onset shortness of breath associated with lower extremity edema.  Patient is afebrile without leukocytosis.  Elevated BNP and chest x-ray findings consistent with volume overload.  Initially requiring BiPAP which has since been titrated off. TTE January 2022 with LVEF 81-19%, grade 2 diastolic dysfunction.  Patient was started on IV furosemide 40 mg twice daily with great urine output; net -6.6 L during hospitalization.  Supplemental oxygen was also titrated off.  IV furosemide was transition to her home dose of torsemide 20 mg p.o. daily.  Recommend continue daily weights, adherence to home medication regimen.  Also discussed extensively during hospitalization low-salt diet.  Outpatient follow-up with PCP.  Recommend repeat BMP 1 week.   COPD exacerbation Supported with IV steroids, neb treatments, and antibiotics during hospitalization.  Symptoms have resolved no further treatment needed as likely etiology precipitating her shortness of breath was from CHF as above but did have a mild COPD exacerbation component as well.   Type 2 diabetes mellitus Hemoglobin A1c 6.9 on 12/12/2021, well-controlled.  On Farxiga and metformin outpatient   Peptic ulcer disease Protonix 40 mg p.o. daily   HTN Losartan 25 mg p.o. daily   HLD Atorvastatin 40 mg p.o. daily   Hypothyroidism Levothyroxine 100 mcg p.o. daily   History of PSVT Carvedilol 6.25 mg p.o. twice daily, Amiodarone 100 mg p.o. daily   PVD Continue aspirin and statin   CKD stage IIIa Creatinine 1.07  at time of discharge  Morbid obesity Body mass index is 31.43 kg/m.  Discussed with patient needs  for aggressive lifestyle changes/weight loss as this complicates all facets of care.  Outpatient follow-up with PCP.    Discharge Diagnoses:  Principal Problem:   Acute respiratory failure with hypoxia (HCC) Active Problems:   Acute on chronic combined systolic and diastolic CHF (congestive heart failure) (HCC)   COPD with acute exacerbation (HCC)   Hypothyroidism   Paroxysmal atrial fibrillation (HCC)   Type 2 diabetes mellitus with peripheral neuropathy (HCC)   Dyslipidemia   GERD without esophagitis    Discharge Instructions  Discharge Instructions     Call MD for:  difficulty breathing, headache or visual disturbances   Complete by: As directed    Call MD for:  extreme fatigue   Complete by: As directed    Call MD for:  persistant dizziness or light-headedness   Complete by: As directed    Call MD for:  persistant nausea and vomiting   Complete by: As directed    Call MD for:  severe uncontrolled pain   Complete by: As directed    Call MD for:  temperature >100.4   Complete by: As directed    Diet - low sodium heart healthy   Complete by: As directed    Increase activity slowly   Complete by: As directed       Allergies as of 03/24/2022       Reactions   Oxycodone Other (See Comments)   Mental status change   Prednisone    swelling, bruising.        Medication List     TAKE these medications    acetaminophen 500 MG tablet Commonly known as: TYLENOL Take 1,000 mg by mouth every 6 (six) hours as needed. Pain   albuterol (2.5 MG/3ML) 0.083% nebulizer solution Commonly known as: PROVENTIL USE 1 VIAL VIA NEBULIZER EVERY 6 HOURS AS NEEDED FOR WHEEZING OR SHORTNESS OF BREATH What changed: See the new instructions.   amiodarone 200 MG tablet Commonly known as: PACERONE Take 0.5 tablets (100 mg total) by mouth daily.   aspirin EC 81 MG tablet Take 81 mg by mouth daily. Swallow whole.   atorvastatin 40 MG tablet Commonly known as: LIPITOR TAKE 1  TABLET(40 MG) BY MOUTH DAILY What changed:  how much to take how to take this when to take this   carvedilol 6.25 MG tablet Commonly known as: COREG TAKE 1 TABLET(6.25 MG) BY MOUTH TWICE DAILY WITH A MEAL What changed:  how much to take how to take this when to take this   dapagliflozin propanediol 10 MG Tabs tablet Commonly known as: Farxiga Take 1 tablet (10 mg total) by mouth daily.   gabapentin 400 MG capsule Commonly known as: NEURONTIN Take 400 mg by mouth 4 (four) times daily.   glucose blood test strip Commonly known as: Contour Next Test Check sugar once daily  DX E11.9   hydrOXYzine 10 MG tablet Commonly known as: ATARAX TAKE 1 TO 2 TABLETS(10 TO 20 MG) BY MOUTH EVERY 6 HOURS AS NEEDED What changed:  how much to take how to take this when to take this reasons to take this   levothyroxine 100 MCG tablet Commonly known as: SYNTHROID TAKE 1 TABLET(100 MCG) BY MOUTH DAILY BEFORE BREAKFAST What changed:  how much to take how to take this when to take this   lidocaine 5 % Commonly known as: LIDODERM 1 patch daily.  losartan 25 MG tablet Commonly known as: COZAAR Take 1 tablet (25 mg total) by mouth every evening.   metFORMIN 1000 MG tablet Commonly known as: GLUCOPHAGE TAKE 1 TABLET(1000 MG) BY MOUTH DAILY WITH SUPPER What changed:  how much to take how to take this when to take this   omeprazole 20 MG capsule Commonly known as: PRILOSEC TAKE 1 CAPSULE(20 MG) BY MOUTH DAILY What changed: See the new instructions.   Spiriva HandiHaler 18 MCG inhalation capsule Generic drug: tiotropium PLACE 1 CAPSULE INTO INHALER AND INHALE EVERY DAY AS NEEDED What changed: See the new instructions.   torsemide 20 MG tablet Commonly known as: DEMADEX Take 1 tablet (20 mg total) by mouth daily. What changed:  how much to take when to take this reasons to take this        [place] an appointment as  soon as possible for a visit in 1 week(s).   Contact information: [address] [place] [address] [phone]                Allergies  Allergen Reactions   Oxycodone Other (See Comments)    Mental status change   Prednisone     swelling, bruising.    Consultations: None   Procedures/Studies: DG Chest Portable 1 View  Result Date: 03/21/2022 CLINICAL DATA:  Shortness of breath EXAM: PORTABLE CHEST 1 VIEW COMPARISON:  03/13/2022 FINDINGS: Stable cardiomegaly. Aortic atherosclerotic calcification. Increased pulmonary vascular congestion and interstitial opacities with a lower lung predominance. Question small left pleural effusion. No pneumothorax. Advanced arthritis both shoulders IMPRESSION: Interval increase in predominantly interstitial opacities in the lower lungs. This is nonspecific and may be due to worsening pneumonia or edema. Cardiomegaly. Aortic Atherosclerosis (ICD10-I70.0). Electronically Signed   By: Placido Sou M.D.   On: 03/21/2022 22:48   DG Chest Port 1 View  Result Date: 03/13/2022 CLINICAL DATA:  88 year old female with shortness of breath. Smoker, COPD. EXAM: PORTABLE CHEST 1 VIEW COMPARISON:  Chest radiograph 10/04/2021 and earlier. FINDINGS: Portable AP upright view at 0511 hours. Stable lung volumes, with large AP dimension to the chest demonstrated in July. Stable cardiac size and mediastinal contours. Borderline to mild cardiomegaly. Calcified aortic atherosclerosis. Visualized tracheal air column is within normal limits. Mildly increased chronic coarse pulmonary interstitial markings at both lung bases greater on the right. No superimposed pneumothorax, edema, effusion, or consolidation. Advanced bilateral shoulder degeneration. Stable visualized osseous structures. IMPRESSION: 1. Chronic lung disease with mildly increased bibasilar reticulonodular opacity suspicious for acute infectious exacerbation in this setting. 2. Aortic  Atherosclerosis (ICD10-I70.0). Electronically Signed   By: Genevie Ann M.D.   On: 03/13/2022 05:38     Subjective: Patient seen examined bedside, resting calmly.  Sitting in edge of bed eating breakfast.  Complaining of not being able to eat ham this morning but did receive bacon.  Discussed with patient needs to closely monitor her sodium intake as this is a likely exacerbating factor to her low fluid retention and CHF exacerbation.  No other specific questions or concerns at this time.  Remains off of oxygen and ready for discharge home.  Denies headache, no dizziness, no chest pain, no palpitations, no shortness of breath, no fever/chills/night sweats, no nausea/vomiting/diarrhea, no focal weakness, no fatigue, no cough/congestion, no paresthesias.  No acute events overnight per nursing staff.  Discharge Exam: Vitals:   03/24/22 0734 03/24/22 0837  BP:  (!) 131/58  Pulse:  66  Resp:  18  Temp:  98.3 F (36.8 C)  SpO2: 92% 95%   Vitals:   03/24/22 0145 03/24/22 0431 03/24/22 0734 03/24/22 0837  BP: (!) 146/64 (!) 144/65  (!) 131/58  Pulse: 72 67  66  Resp: '16 17  18  '$ Temp: 97.8 F (36.6 C) 98 F (36.7 C)  98.3 F (36.8 C)  TempSrc: Oral Oral  Oral  SpO2: 93% 95% 92% 95%  Weight:      Height:        Physical Exam: GEN: NAD, alert and oriented x 3, obese HEENT: NCAT, PERRL, EOMI, sclera clear, MMM PULM: CTAB w/o wheezes/crackles, normal respiratory effort, on room air CV: RRR w/o M/G/R GI: abd soft, NTND, NABS, no R/G/M MSK: no peripheral edema, muscle strength globally intact 5/5 bilateral upper/lower extremities NEURO: CN II-XII intact, no focal deficits, sensation to light touch intact PSYCH: normal mood/affect Integumentary: dry/intact, no rashes or wounds    The results of significant diagnostics from this hospitalization (including imaging, microbiology, ancillary and laboratory) are listed below for reference.     Microbiology: Recent Results (from the past 240  hour(s))  Resp panel by RT-PCR (RSV, Flu A&B, Covid) Anterior Nasal Swab     Status: None   Collection Time: 03/21/22 10:26 PM   Specimen: Anterior Nasal Swab  Result Value Ref Range Status   SARS Coronavirus 2 by RT PCR NEGATIVE NEGATIVE Final    Comment: (NOTE) SARS-CoV-2 target nucleic acids are NOT DETECTED.  The SARS-CoV-2 RNA is generally detectable in upper respiratory specimens during the acute phase of infection. The lowest concentration of SARS-CoV-2 viral copies this assay can detect is 138 copies/mL. A negative result does not preclude SARS-Cov-2 infection and should not be used as the sole basis for treatment or other patient management decisions. A negative result may occur with  improper specimen collection/handling, submission of specimen other than nasopharyngeal swab, presence of viral mutation(s) within the areas targeted by this assay, and inadequate number of viral copies(<138 copies/mL). A negative result must be combined with clinical observations, patient history, and epidemiological information. The expected result is Negative.  Fact Sheet for Patients:  EntrepreneurPulse.com.au  Fact Sheet for Healthcare Providers:  IncredibleEmployment.be  This test is no t yet approved or cleared by the [place] FDA and  has been authorized for detection and/or diagnosis of SARS-CoV-2 by FDA under an Emergency Use Authorization (EUA). This EUA will remain  in effect (meaning this test can be used) for the duration of the COVID-19 declaration under Section 564(b)(1) of the Act, 21 U.S.C.section 360bbb-3(b)(1), unless the authorization is terminated  or revoked sooner.       Influenza A by PCR NEGATIVE NEGATIVE Final   Influenza B by PCR NEGATIVE NEGATIVE Final    Comment: (NOTE) The Xpert Xpress SARS-CoV-2/FLU/RSV plus assay is intended as an aid in the diagnosis of influenza from Nasopharyngeal swab specimens and should not  be used as a sole basis for treatment. Nasal washings and aspirates are unacceptable for Xpert Xpress SARS-CoV-2/FLU/RSV testing.  Fact Sheet for Patients: EntrepreneurPulse.com.au  Fact Sheet for Healthcare Providers: IncredibleEmployment.be  This test is not yet approved or cleared by the [place] FDA and has been authorized for detection and/or diagnosis of SARS-CoV-2 by FDA under an Emergency Use Authorization (EUA). This EUA will remain in effect (meaning this test can be used) for the duration of the COVID-19 declaration under Section 564(b)(1) of the Act, 21 U.S.C. section 360bbb-3(b)(1), unless the  authorization is terminated or revoked.     Resp Syncytial Virus by PCR NEGATIVE NEGATIVE Final    Comment: (NOTE) Fact Sheet for Patients: EntrepreneurPulse.com.au  Fact Sheet for Healthcare Providers: IncredibleEmployment.be  This test is not yet approved or cleared by the [place] FDA and has been authorized for detection and/or diagnosis of SARS-CoV-2 by FDA under an Emergency Use Authorization (EUA). This EUA will remain in effect (meaning this test can be used) for the duration of the COVID-19 declaration under Section 564(b)(1) of the Act, 21 U.S.C. section 360bbb-3(b)(1), unless the authorization is terminated or revoked.  Performed at [facility] [address] [place], [place] [address]      Labs: BNP (last 3 results) Recent Labs    03/13/22 0448 03/21/22 2225  BNP 478.7* 604.5*   Basic Metabolic Panel: Recent Labs  Lab 03/21/22 2225 03/22/22 0442 03/23/22 0441 03/24/22 0436  NA 143 142 139 141  K 4.5 4.7 4.1 4.0  CL 114* 109 105 104  CO2 23 21* 24 27  GLUCOSE 173* 186* 215* 195*  BUN 30* 30* 41* 40*  CREATININE 1.07* 1.16* 1.27* 1.07*  CALCIUM 9.6 9.5 9.1 9.3  MG  --   --  2.3  --    Liver Function Tests: Recent Labs  Lab 03/21/22 2225  AST 21   ALT 16  ALKPHOS 93  BILITOT 0.5  PROT 7.4  ALBUMIN 3.7   No results for input(s): "LIPASE", "AMYLASE" in the last 168 hours. No results for input(s): "AMMONIA" in the last 168 hours. CBC: Recent Labs  Lab 03/21/22 2225 03/22/22 0442  WBC 7.6 6.2  NEUTROABS 4.4  --   HGB 8.9* 8.5*  HCT 33.3* 31.6*  MCV 82.6 82.1  PLT 336 278   Cardiac Enzymes: No results for input(s): "CKTOTAL", "CKMB", "CKMBINDEX", "TROPONINI" in the last 168 hours. BNP: Invalid input(s): "POCBNP" CBG: Recent Labs  Lab 03/23/22 0738 03/23/22 1140 03/23/22 1629 03/23/22 2105 03/24/22 0834  GLUCAP 182* 291* 228* 252* 200*   D-Dimer No results for input(s): "DDIMER" in the last 72 hours. Hgb A1c No results for input(s): "HGBA1C" in the last 72 hours. Lipid Profile No results for input(s): "CHOL", "HDL", "LDLCALC", "TRIG", "CHOLHDL", "LDLDIRECT" in the last 72 hours. Thyroid function studies No results for input(s): "TSH", "T4TOTAL", "T3FREE", "THYROIDAB" in the last 72 hours.  Invalid input(s): "FREET3" Anemia work up No results for input(s): "VITAMINB12", "FOLATE", "FERRITIN", "TIBC", "IRON", "RETICCTPCT" in the last 72 hours. Urinalysis    Component Value Date/Time   COLORURINE STRAW (A) 05/24/2020 0746   APPEARANCEUR CLEAR (A) 05/24/2020 0746   APPEARANCEUR Clear 06/23/2014 1357   LABSPEC 1.016 05/24/2020 0746   LABSPEC 1.020 06/23/2014 1357   PHURINE 5.0 05/24/2020 0746   GLUCOSEU 150 (A) 05/24/2020 0746   GLUCOSEU Negative 06/23/2014 1357   HGBUR NEGATIVE 05/24/2020 0746   BILIRUBINUR NEGATIVE 05/24/2020 0746   BILIRUBINUR Negative 01/19/2020 1112   BILIRUBINUR Negative 06/23/2014 1357   KETONESUR NEGATIVE 05/24/2020 0746   PROTEINUR NEGATIVE 05/24/2020 0746   UROBILINOGEN 0.2 01/19/2020 1112   NITRITE NEGATIVE 05/24/2020 0746   LEUKOCYTESUR SMALL (A) 05/24/2020 0746   LEUKOCYTESUR 1+ 06/23/2014 1357   Sepsis Labs Recent Labs  Lab 03/21/22 2225 03/22/22 0442  WBC 7.6 6.2    Microbiology Recent Results (from the past 240 hour(s))  Resp panel by RT-PCR (RSV, Flu A&B, Covid) Anterior Nasal Swab     Status: None   Collection Time: 03/21/22 10:26 PM   Specimen:  Anterior Nasal Swab  Result Value Ref Range Status   SARS Coronavirus 2 by RT PCR NEGATIVE NEGATIVE Final    Comment: (NOTE) SARS-CoV-2 target nucleic acids are NOT DETECTED.  The SARS-CoV-2 RNA is generally detectable in upper respiratory specimens during the acute phase of infection. The lowest concentration of SARS-CoV-2 viral copies this assay can detect is 138 copies/mL. A negative result does not preclude SARS-Cov-2 infection and should not be used as the sole basis for treatment or other patient management decisions. A negative result may occur with  improper specimen collection/handling, submission of specimen other than nasopharyngeal swab, presence of viral mutation(s) within the areas targeted by this assay, and inadequate number of viral copies(<138 copies/mL). A negative result must be combined with clinical observations, patient history, and epidemiological information. The expected result is Negative.  Fact Sheet for Patients:  EntrepreneurPulse.com.au  Fact Sheet for Healthcare Providers:  IncredibleEmployment.be  This test is no t yet approved or cleared by the [place] FDA and  has been authorized for detection and/or diagnosis of SARS-CoV-2 by FDA under an Emergency Use Authorization (EUA). This EUA will remain  in effect (meaning this test can be used) for the duration of the COVID-19 declaration under Section 564(b)(1) of the Act, 21 U.S.C.section 360bbb-3(b)(1), unless the authorization is terminated  or revoked sooner.       Influenza A by PCR NEGATIVE NEGATIVE Final   Influenza B by PCR NEGATIVE NEGATIVE Final    Comment: (NOTE) The Xpert Xpress SARS-CoV-2/FLU/RSV plus assay is intended as an aid in the diagnosis of  influenza from Nasopharyngeal swab specimens and should not be used as a sole basis for treatment. Nasal washings and aspirates are unacceptable for Xpert Xpress SARS-CoV-2/FLU/RSV testing.  Fact Sheet for Patients: EntrepreneurPulse.com.au  Fact Sheet for Healthcare Providers: IncredibleEmployment.be  This test is not yet approved or cleared by the [place] FDA and has been authorized for detection and/or diagnosis of SARS-CoV-2 by FDA under an Emergency Use Authorization (EUA). This EUA will remain in effect (meaning this test can be used) for the duration of the COVID-19 declaration under Section 564(b)(1) of the Act, 21 U.S.C. section 360bbb-3(b)(1), unless the authorization is terminated or revoked.     Resp Syncytial Virus by PCR NEGATIVE NEGATIVE Final    Comment: (NOTE) Fact Sheet for Patients: EntrepreneurPulse.com.au  Fact Sheet for Healthcare Providers: IncredibleEmployment.be  This test is not yet approved or cleared by the [place] FDA and has been authorized for detection and/or diagnosis of SARS-CoV-2 by FDA under an Emergency Use Authorization (EUA). This EUA will remain in effect (meaning this test can be used) for the duration of the COVID-19 declaration under Section 564(b)(1) of the Act, 21 U.S.C. section 360bbb-3(b)(1), unless the authorization is terminated or revoked.  Performed at [facility], [address], [place], [place] [address]      Time coordinating discharge: Over 30 minutes  SIGNED:   Darrian Grzelak J [place] DO  [facility] 03/24/2022, 11:03 AM

## 2022-03-24 NOTE — TOC Transition Note (Addendum)
Transition of Care [facility]) - CM/SW Discharge Note   Patient Details  Name: Kathryn [name] MRN: [id number] Date of Birth: Aug 15, 1933  [facility] CM/SW Contact:  Gerilyn Pilgrim, LCSW Phone Number: 03/24/2022, 12:26 PM   Clinical Narrative:    [place] PT/RN arranged through [facility] [place]. Corene Cornea with [facility] notified.RW ordered to be delivered to patients house, SW verified pt's address. Jasmine with [employer] confirmed this would be delivered.  No additional needs CSW signing off. Discharge summary is in.     Final next level of care: [place] Barriers to Discharge: Barriers Resolved   Patient Goals and CMS Choice   Choice offered to / list presented to : Patient  Discharge Placement                         Discharge Plan and Services Additional resources added to the After Visit Summary for       Post Acute Care Choice: Resumption of Svcs/PTA Provider                    HH Arranged: PT, RN [facility] Agency: [place] ([place]) Date HH Agency Contacted: 03/24/22 Time [place]: 53 Representative spoke with at [place]: [place] Determinants of Health ([place]) Interventions [place]: No Food Insecurity (03/23/2022)  Housing: Low Risk  (03/23/2022)  Transportation Needs: No Transportation Needs (03/23/2022)  Utilities: Not At Risk (03/23/2022)  Alcohol Screen: Low Risk  (11/16/2021)  Depression (PHQ2-9): Medium Risk (11/16/2021)  Tobacco Use: Medium Risk (03/21/2022)     Readmission Risk Interventions    05/27/2020   12:12 PM  Readmission Risk Prevention Plan  Transportation Screening Complete  PCP or Specialist Appt within 5-7 Days Complete  Home Care Screening Complete  Medication Review (RN CM) Complete

## 2022-03-27 ENCOUNTER — Telehealth: Payer: Self-pay | Admitting: *Deleted

## 2022-03-27 ENCOUNTER — Other Ambulatory Visit (INDEPENDENT_AMBULATORY_CARE_PROVIDER_SITE_OTHER): Payer: Self-pay | Admitting: Vascular Surgery

## 2022-03-27 DIAGNOSIS — R609 Edema, unspecified: Secondary | ICD-10-CM

## 2022-03-27 NOTE — Patient Outreach (Signed)
  Care Coordination [facility] Note Transition Care Management Unsuccessful Follow-up Telephone Call  Date of discharge and from where:  [facility] [phone]  Attempts:  1st Attempt  Reason for unsuccessful TCM follow-up call:  Left voice message  [place] Management [phone]

## 2022-03-28 ENCOUNTER — Telehealth: Payer: Self-pay

## 2022-03-28 NOTE — Patient Outreach (Signed)
  Care Coordination TOC Note Transition Care Management Follow-up Telephone Call Date of discharge and from where: 03/24/22-[facility]   Dx: "COPD exacerbation" How have you been since you were released from the hospital? Patient voices that she is "doing great." She has been taking her breathing txs about 3x/day and it is managing/helping with her breathing. She denies any SOB at present but voices occasionally she gets short winded with exertion so she is not doing too much and resting more. She forgot to weigh since returning home. Provided eduction on proper weighing technique and she will begin weighing in the morning.  Any questions or concerns? No  Items Reviewed: Did the pt receive and understand the discharge instructions provided? Yes  Medications obtained and verified? Yes  Other? Yes  Any new allergies since your discharge? No  Dietary orders reviewed? Yes Do you have support at home? Yes -daughter in law and son assists her  [place] and Equipment/Supplies: Were home health services ordered? yes If so, what is the name of the agency? [facility]  Has the agency set up a time to come to the patient's home? Patient states  Were any new equipment or medical supplies ordered?  Yes: rolling walker What is the name of the medical supply agency? [employer] Were you able to get the supplies/equipment? yes Do you have any questions related to the use of the equipment or supplies? No  Functional Questionnaire: (I = Independent and D = Dependent) ADLs: A  Bathing/Dressing- I  Meal Prep- A  Eating- I  Maintaining continence- I  Transferring/Ambulation- I  Managing Meds- I  Follow up appointments reviewed:  PCP Hospital f/u appt confirmed? Yes  Scheduled to see Dr. Alba Cory on 04/05/22 @ 2:20 pm. [facility] f/u appt confirmed? Yes  Scheduled to see Lysle Morales on 04/03/22 @ 3 pm. Are transportation arrangements needed? No  If their condition worsens, is the pt aware  to call PCP or go to the Emergency Dept.? Yes Was the patient provided with contact information for the PCP's office or ED? Yes Was to pt encouraged to call back with questions or concerns? Yes  SDOH assessments and interventions completed:   Yes SDOH Interventions Today    Flowsheet Row Most Recent Value  SDOH Interventions   Food Insecurity Interventions Intervention Not Indicated  Transportation Interventions Intervention Not Indicated       Care Coordination Interventions:  Referred for Care Coordination Services:  RN Care Coordinator Education provided regarding HF and COPD mgmt    Encounter Outcome:  Pt. Visit Completed    Enzo Montgomery, RN,BSN,CCM [place] Management Telephonic Care Management Coordinator Direct Phone: [phone] Toll Free: [phone] Fax: [phone]

## 2022-03-29 ENCOUNTER — Encounter (INDEPENDENT_AMBULATORY_CARE_PROVIDER_SITE_OTHER): Payer: Self-pay | Admitting: Vascular Surgery

## 2022-03-29 ENCOUNTER — Ambulatory Visit (INDEPENDENT_AMBULATORY_CARE_PROVIDER_SITE_OTHER): Payer: Medicare HMO

## 2022-03-29 ENCOUNTER — Ambulatory Visit (INDEPENDENT_AMBULATORY_CARE_PROVIDER_SITE_OTHER): Payer: Medicare HMO | Admitting: Vascular Surgery

## 2022-03-29 VITALS — BP 104/57 | HR 59 | Wt 182.0 lb

## 2022-03-29 DIAGNOSIS — I25119 Atherosclerotic heart disease of native coronary artery with unspecified angina pectoris: Secondary | ICD-10-CM

## 2022-03-29 DIAGNOSIS — E119 Type 2 diabetes mellitus without complications: Secondary | ICD-10-CM

## 2022-03-29 DIAGNOSIS — I10 Essential (primary) hypertension: Secondary | ICD-10-CM | POA: Diagnosis not present

## 2022-03-29 DIAGNOSIS — I890 Lymphedema, not elsewhere classified: Secondary | ICD-10-CM | POA: Diagnosis not present

## 2022-03-29 DIAGNOSIS — I7025 Atherosclerosis of native arteries of other extremities with ulceration: Secondary | ICD-10-CM

## 2022-03-29 DIAGNOSIS — R609 Edema, unspecified: Secondary | ICD-10-CM

## 2022-03-30 ENCOUNTER — Encounter (INDEPENDENT_AMBULATORY_CARE_PROVIDER_SITE_OTHER): Payer: Self-pay | Admitting: Vascular Surgery

## 2022-04-02 NOTE — Progress Notes (Deleted)
Patient ID: Kathryn [name], female    DOB: [birth year], 89 y.o.   MRN: [id number]  Kathryn [name] is a 89 y/o female with a history of atrial fibrillation, DM, hyperlipidemia, HTN, thyroid disease, PVD, gastric ulcer, COPD, previous tobacco use and chronic heart failure.   Echo report from 03/28/20 reviewed and showed an EF of 40-45% along with mild LAE and mild MR/ AR.   Admitted 03/21/22 due to a/c heart failure. Diuresed.   She presents today for a follow-up visit with a chief complaint of   Past Medical History:  Diagnosis Date   Actinic keratosis    Aortic atherosclerosis ([place]) 05/25/2020   Arthritis    spinal stenosis   Cardiomyopathy - presumed to be nonischemic    a. 03/2020 Echo: EF 40-45%, gr2 DD. Nl RV fxn. Mildly dil LA. Mild MR/ao sclerosis; b. 03/2020 MV: EF 30-44%, no ischemia. Cor Ca2+ in pLAD.   CHF (congestive heart failure) (HCC)    COPD (chronic obstructive pulmonary disease) (HCC)    Coronary artery calcification seen on CT scan    a. 03/2020 MV: CT attenuation images show Ao and mod pLAD Ca2+.   Diabetes mellitus without complication ([place])    Gastric ulcer 06/2010   treated with Prolisec- states no problems now   Hyperlipidemia    Hypertension    PCP Dr Miguel Aschoff   [place]   Hypothyroidism    Left bundle branch block (LBBB)    Neuromuscular disorder (HCC)    slight numbness right toes- comes and goes   Peripheral vascular disease (HCC)    varicose veins left leg   Pneumonia    PSVT (paroxysmal supraventricular tachycardia)    a. 03/2020 Zio: Avg HR 87 (66-211).  4 beats NSVT.  43 PSVT episodes, longest 10h 38m@ avg of 153 bpm (max 211).  Seen by EP-->amio started (pt wished to avoid EPS/RFCA).   Seasonal allergies    Shortness of breath    Skin cancer    multiple from face   Squamous cell carcinoma of skin 03/23/2013   L dorsal hand   Squamous cell carcinoma of skin 02/04/2014   R forearm/in situ, L pretibial   Squamous cell carcinoma of skin  09/24/2018   R thumb   Squamous cell carcinoma of skin 09/08/2019   L forearm - ED&C   Squamous cell carcinoma of skin 09/08/2019   R dosal hand - ED&C   Squamous cell carcinoma of skin 06/20/2021   R cheek preauricular, excised   Past Surgical History:  Procedure Laterality Date   BACK SURGERY     4/12  Dr AShellia Carwin for lumbar stenosis   BREAST CYST ASPIRATION Left 1965   [place]   [place]umpect[place] with biopsy   left   CARPAL TUNNEL RELEASE     right   COLONOSCOPY     COLONOSCOPY WITH PROPOFOL N/A 06/30/2015   Procedure: COLONOSCOPY WITH PROPOFOL;  Surgeon: PHulen Luster MD;  [facility]  Service: Gastroenterology;  Laterality: N/A;   COLONOSCOPY WITH PROPOFOL N/A 01/22/2017   Procedure: COLONOSCOPY WITH PROPOFOL;  Surgeon: SLollie Sails MD;  [facility];  Service: Endoscopy;  Laterality: N/A;   COLONOSCOPY WITH PROPOFOL N/A 01/28/2019   Procedure: COLONOSCOPY WITH PROPOFOL;  Surgeon: Toledo, TBenay Pike MD;  Location: [facility] ENDOSCOPY;  Service: Gastroenterology;  Laterality: N/A;   ESOPHAGOGASTRODUODENOSCOPY     ESOPHAGOGASTRODUODENOSCOPY (EGD) WITH PROPOFOL N/A 01/28/2019   Procedure: ESOPHAGOGASTRODUODENOSCOPY (EGD) WITH  PROPOFOL;  Surgeon: Toledo, Benay Pike, MD;  Location: [facility] ENDOSCOPY;  Service: Gastroenterology;  Laterality: N/A;   EYE SURGERY     cataract extraction with IOL bilaterally   JOINT REPLACEMENT     LUMBAR LAMINECTOMY/DECOMPRESSION MICRODISCECTOMY  05/24/2011   Procedure: LUMBAR LAMINECTOMY/DECOMPRESSION MICRODISCECTOMY 2 LEVELS;  Surgeon: Magnus Sinning, MD;  Location: [facility];  Service: Orthopedics;  Laterality: N/A;  L2-L3, L3-L4 (x-ray)   RIGHT OOPHORECTOMY     due to STAPH INFECTION   Family History  Problem Relation Age of Onset   Dementia Mother    Lung cancer Father    Heart disease Father    Hypertension Sister    Breast cancer Maternal Aunt    Arthritis Son    Social History   Tobacco Use    Smoking status: Former    Packs/day: 0.50    Years: 50.00    Total pack years: 25.00    Types: Cigarettes    Quit date: 05/16/2004    Years since quitting: 17.8   Smokeless tobacco: Never  Substance Use Topics   Alcohol use: Yes    Comment: socially-1 glass of wine or [place] icea tea once a month   Allergies  Allergen Reactions   Oxycodone Other (See Comments)    Mental status change   Prednisone     swelling, bruising.    Review of Systems  Constitutional:  Positive for fatigue. Negative for appetite change.  HENT:  Negative for congestion, rhinorrhea and sore throat.   Eyes: Negative.   Respiratory:  Positive for shortness of breath. Negative for cough and chest tightness.   Cardiovascular:  Positive for leg swelling. Negative for chest pain and palpitations.  Gastrointestinal:  Negative for abdominal distention and abdominal pain.  Endocrine: Negative.   Genitourinary: Negative.   Musculoskeletal:  Positive for arthralgias (shoulders) and back pain.  Skin: Negative.   Allergic/Immunologic: Negative.   Neurological:  Negative for dizziness and light-headedness.  Hematological:  Negative for adenopathy. Does not bruise/bleed easily.  Psychiatric/Behavioral:  Positive for sleep disturbance (sleeping on 1-2 pillows). Negative for dysphoric mood. The patient is nervous/anxious.        Physical Exam Vitals and nursing note reviewed. Exam conducted with a chaperone present (sister).  Constitutional:      Appearance: She is well-developed.  HENT:     Head: Normocephalic and atraumatic.  Neck:     Vascular: No JVD.  Cardiovascular:     Rate and Rhythm: Normal rate and regular rhythm.  Pulmonary:     Effort: Pulmonary effort is normal. No respiratory distress.     Breath sounds: No wheezing or rales.  Abdominal:     Palpations: Abdomen is soft.     Tenderness: There is no abdominal tenderness.  Musculoskeletal:     Cervical back: Neck supple.     Right lower  leg: No tenderness. Edema (trace pitting) present.     Left lower leg: No tenderness. Edema (trace pitting) present.  Skin:    General: Skin is warm and dry.  Neurological:     General: No focal deficit present.     Mental Status: She is alert and oriented to person, place, and time.  Psychiatric:        Mood and Affect: Mood normal.        Behavior: Behavior normal.    Assessment & Plan:  1: Chronic heart failure with reduced ejection fraction- - NYHA class II - euvolemic today - weighing daily and reminded  to call for an overnight weight gain of > 2 pounds or a weekly weight gain of >5 pounds - weight 176.8 pounds from last visit here 1 year ago - not adding much salt now; encouraged her to continue to read food labels for sodium content and keep daily sodium content to < '2000mg'$  / day - [facility] cardiology Rockey Situ) 08/28/21 - on GDMT of carvedilol, losartan, farxiga - not wearing compression socks daily, advised to wear every day to help with pedal edema - BNP 03/21/22 was 676.4 - participating in paramedicine program  2: HTN- - BP  - saw PCP (Rumball) 01/16/22 - BMP 03/24/22 reviewed and showed sodium 141, potassium 4.0, creatinine 1.07 and GFR 51  3: Diabetes- - A1c 12/12/21 was 6.9%  4: PSVT- - saw EP Quentin Ore) 12/27/21 - continues on amiodarone  - RRR today  - possible ablation in the future if needed  5: Lymphedema- - saw vascular (Schnier) 03/29/22   Patient did not bring her medications nor a list. Each medication was verbally reviewed with the patient and she was encouraged to bring the bottles to every visit to confirm accuracy of list.

## 2022-04-03 ENCOUNTER — Encounter: Payer: Medicare Other | Admitting: Family

## 2022-04-04 NOTE — Progress Notes (Signed)
I,Kathryn [name],acting as a scribe for [employer], MD.,have documented all relevant documentation on the behalf of Kathryn Foster, MD,as directed by  Kathryn Foster, MD while in the presence of Kathryn Foster, MD.   Established patient visit   Patient: Kathryn [name]   DOB: 1934/01/26   89 y.o. Female  MRN: [id number] Visit Date: 04/05/2022  Today's healthcare provider: [name], MD   Chief Complaint  Patient presents with   Hospitalization Follow-up   Subjective    HPI  Follow up Hospitalization  Kathryn [name] is present with patient, her son  Patient was admitted to [facility] on 03/21/22 and discharged on 03/24/22. She was treated for COPD exacerbation and Acute respiratory failure with hypoxia . Telephone follow up was done on 01/02 and on 01/04 She reports excellent compliance with treatment. She reports this condition is improved.  She was temporarily on supplemental oxygen and did not need following discharge  She reports that she was down to taking 1 torsemide daily   She denies SOB  ----------------------------------------------------------------------------------------- -   Shingles - takes Gabapentin '400mg'$  four times daily for this, reports having   Medications: Outpatient Medications Prior to Visit  Medication Sig   acetaminophen (TYLENOL) 500 MG tablet Take 1,000 mg by mouth every 6 (six) hours as needed. Pain    albuterol (PROVENTIL) (2.5 MG/3ML) 0.083% nebulizer solution USE 1 VIAL VIA NEBULIZER EVERY 6 HOURS AS NEEDED FOR WHEEZING OR SHORTNESS OF BREATH (Patient taking differently: Take 2.5 mg by nebulization every 6 (six) hours as needed for wheezing or shortness of breath.)   amiodarone (PACERONE) 200 MG tablet Take 0.5 tablets (100 mg total) by mouth daily.   aspirin 81 MG EC tablet Take 81 mg by mouth daily. Swallow whole.   atorvastatin (LIPITOR) 40 MG tablet TAKE 1 TABLET(40 MG) BY MOUTH DAILY  (Patient taking differently: Take 40 mg by mouth daily. TAKE 1 TABLET(40 MG) BY MOUTH DAILY)   carvedilol (COREG) 6.25 MG tablet TAKE 1 TABLET(6.25 MG) BY MOUTH TWICE DAILY WITH A MEAL (Patient taking differently: Take 6.25 mg by mouth 2 (two) times daily with a meal. TAKE 1 TABLET(6.25 MG) BY MOUTH TWICE DAILY WITH A MEAL)   dapagliflozin propanediol (FARXIGA) 10 MG TABS tablet Take 1 tablet (10 mg total) by mouth daily.   glucose blood (CONTOUR NEXT TEST) test strip Check sugar once daily  DX E11.9   hydrOXYzine (ATARAX) 10 MG tablet TAKE 1 TO 2 TABLETS(10 TO 20 MG) BY MOUTH EVERY 6 HOURS AS NEEDED (Patient taking differently: Take 10-20 mg by mouth every 6 (six) hours as needed for anxiety. TAKE 1 TO 2 TABLETS(10 TO 20 MG) BY MOUTH EVERY 6 HOURS AS NEEDED)   levothyroxine (SYNTHROID) 100 MCG tablet TAKE 1 TABLET(100 MCG) BY MOUTH DAILY BEFORE BREAKFAST (Patient taking differently: Take 100 mcg by mouth daily before breakfast. TAKE 1 TABLET(100 MCG) BY MOUTH DAILY BEFORE BREAKFAST)   losartan (COZAAR) 25 MG tablet Take 1 tablet (25 mg total) by mouth every evening.   metFORMIN (GLUCOPHAGE) 1000 MG tablet TAKE 1 TABLET(1000 MG) BY MOUTH DAILY WITH SUPPER (Patient taking differently: Take 1,000 mg by mouth daily with supper. TAKE 1 TABLET(1000 MG) BY MOUTH DAILY WITH SUPPER)   omeprazole (PRILOSEC) 20 MG capsule TAKE 1 CAPSULE(20 MG) BY MOUTH DAILY (Patient taking differently: Take 20 mg by mouth daily.)   SPIRIVA HANDIHALER 18 MCG inhalation capsule PLACE 1 CAPSULE INTO INHALER AND INHALE EVERY DAY AS NEEDED (Patient taking differently: Place  18 mcg into inhaler and inhale daily as needed.)   torsemide (DEMADEX) 20 MG tablet Take 1 tablet (20 mg total) by mouth daily.   [DISCONTINUED] gabapentin (NEURONTIN) 400 MG capsule Take 400 mg by mouth 4 (four) times daily.   lidocaine (LIDODERM) 5 % 1 patch daily.   No facility-administered medications prior to visit.    Review of Systems     Objective     BP 106/66 (BP Location: Right Arm, Patient Position: Sitting, Cuff Size: Large)   Pulse 69   Resp 16   Wt 181 lb 14.4 oz (82.5 kg)   SpO2 97%   BMI 31.22 kg/m    Physical Exam Vitals reviewed.  Constitutional:      General: She is not in acute distress.    Appearance: Normal appearance. She is not ill-appearing, toxic-appearing or diaphoretic.  Eyes:     Conjunctiva/sclera: Conjunctivae normal.  Cardiovascular:     Rate and Rhythm: Normal rate and regular rhythm.     Pulses: Normal pulses.     Heart sounds: Normal heart sounds. No murmur heard.    No friction rub. No gallop.  Pulmonary:     Effort: Pulmonary effort is normal. No respiratory distress.     Breath sounds: Normal breath sounds. No stridor. No wheezing, rhonchi or rales.     Comments: Decreased breath sounds, no wheezing, no rales appreciated  Abdominal:     General: Bowel sounds are normal. There is no distension.     Palpations: Abdomen is soft.     Tenderness: There is no abdominal tenderness.  Musculoskeletal:     Right lower leg: Edema present.     Left lower leg: Edema present.     Comments: Legs wrapped in compression socks and velcro wraps   Skin:    Findings: No erythema or rash.  Neurological:     Mental Status: She is alert and oriented to person, place, and time.      Results for orders placed or performed in visit on 54/65/68  Basic Metabolic Panel (BMET)  Result Value Ref Range   Glucose 131 (H) 70 - 99 mg/dL   BUN 37 (H) 8 - 27 mg/dL   Creatinine, Ser 1.30 (H) 0.57 - 1.00 mg/dL   eGFR 40 (L) >59 mL/min/1.73   BUN/Creatinine Ratio 28 12 - 28   Sodium 143 134 - 144 mmol/L   Potassium 5.8 (H) 3.5 - 5.2 mmol/L   Chloride 109 (H) 96 - 106 mmol/L   CO2 20 20 - 29 mmol/L   Calcium 9.5 8.7 - 10.3 mg/dL      Assessment & Plan     Problem List Items Addressed This Visit       Genitourinary   AKI (acute kidney injury) ([place])    Recheck BMP        Relevant Orders   Basic  Metabolic Panel (BMET) (Completed)     Other   Hospital discharge follow-up - [facility] follow up Will recheck BMP today  Patient reports taking discharge medications as prescribed and overall feeling improved  She has been on RA and breathing comfortably         Relevant Orders   Basic Metabolic Panel (BMET) (Completed)     Return in about 3 months (around 07/05/2022) for Chronic conditions.      I, Kathryn Foster, MD, have reviewed all documentation for this visit.  Portions of this information were initially documented by the [place] and  reviewed by me for thoroughness and accuracy.      Kathryn Foster, MD  [facility] [phone] (phone) [phone] (fax)  [place]

## 2022-04-05 ENCOUNTER — Ambulatory Visit (INDEPENDENT_AMBULATORY_CARE_PROVIDER_SITE_OTHER): Payer: Medicare HMO | Admitting: Family Medicine

## 2022-04-05 ENCOUNTER — Encounter: Payer: Self-pay | Admitting: Family Medicine

## 2022-04-05 VITALS — BP 106/66 | HR 69 | Resp 16 | Wt 181.9 lb

## 2022-04-05 DIAGNOSIS — Z09 Encounter for follow-up examination after completed treatment for conditions other than malignant neoplasm: Secondary | ICD-10-CM | POA: Insufficient documentation

## 2022-04-05 DIAGNOSIS — N179 Acute kidney failure, unspecified: Secondary | ICD-10-CM | POA: Diagnosis not present

## 2022-04-05 MED ORDER — GABAPENTIN 400 MG PO CAPS: 400.0000 mg | ORAL_CAPSULE | Freq: Four times a day (QID) | ORAL | 2 refills | Status: DC

## 2022-04-05 NOTE — Patient Instructions (Signed)
Today we will check lab work to follow-up on your kidney function.  We will follow-up once results are available.  Please plan to follow-up with me in 3 months  I have submitted refills for your gabapentin.   Please continue to take your torsemide and other medications as prescribed.  We will not change or add any medications today.

## 2022-04-06 ENCOUNTER — Other Ambulatory Visit: Payer: Self-pay

## 2022-04-06 DIAGNOSIS — E875 Hyperkalemia: Secondary | ICD-10-CM

## 2022-04-06 LAB — BASIC METABOLIC PANEL
BUN/Creatinine Ratio: 28 (ref 12–28)
BUN: 37 mg/dL — ABNORMAL HIGH (ref 8–27)
CO2: 20 mmol/L (ref 20–29)
Calcium: 9.5 mg/dL (ref 8.7–10.3)
Chloride: 109 mmol/L — ABNORMAL HIGH (ref 96–106)
Creatinine, Ser: 1.3 mg/dL — ABNORMAL HIGH (ref 0.57–1.00)
Glucose: 131 mg/dL — ABNORMAL HIGH (ref 70–99)
Potassium: 5.8 mmol/L — ABNORMAL HIGH (ref 3.5–5.2)
Sodium: 143 mmol/L (ref 134–144)
eGFR: 40 mL/min/{1.73_m2} — ABNORMAL LOW (ref >59)

## 2022-04-06 NOTE — Assessment & Plan Note (Signed)
Recheck BMP.

## 2022-04-06 NOTE — Assessment & Plan Note (Addendum)
Hospital follow up Will recheck BMP today  Patient reports taking discharge medications as prescribed and overall feeling improved  She has been on RA and breathing comfortably

## 2022-04-09 ENCOUNTER — Ambulatory Visit: Payer: Self-pay | Admitting: *Deleted

## 2022-04-09 NOTE — Patient Outreach (Signed)
  Care Coordination   04/09/2022 Name: Kathryn [name] MRN: [id number] DOB: October 07, 1933   Care Coordination Outreach Attempts:  An unsuccessful telephone outreach was attempted for a scheduled appointment today.  Follow Up Plan:  Additional outreach attempts will be made to offer the patient care coordination information and services.   Encounter Outcome:  No Answer   Care Coordination Interventions:  No, not indicated    Valente David, RN, MSN, [facility] Care Management Care Management Coordinator [phone]

## 2022-04-10 ENCOUNTER — Telehealth: Payer: Self-pay | Admitting: Family Medicine

## 2022-04-10 LAB — BASIC METABOLIC PANEL
BUN/Creatinine Ratio: 22 (ref 12–28)
BUN: 27 mg/dL (ref 8–27)
CO2: 20 mmol/L (ref 20–29)
Calcium: 9.6 mg/dL (ref 8.7–10.3)
Chloride: 107 mmol/L — ABNORMAL HIGH (ref 96–106)
Creatinine, Ser: 1.21 mg/dL — ABNORMAL HIGH (ref 0.57–1.00)
Glucose: 128 mg/dL — ABNORMAL HIGH (ref 70–99)
Potassium: 5.4 mmol/L — ABNORMAL HIGH (ref 3.5–5.2)
Sodium: 142 mmol/L (ref 134–144)
eGFR: 44 mL/min/{1.73_m2} — ABNORMAL LOW (ref >59)

## 2022-04-10 NOTE — Telephone Encounter (Signed)
Ok for verbal orders.    Tuere Nwosu [name], MD  [facility]  

## 2022-04-10 NOTE — Telephone Encounter (Signed)
Home Health Verbal Orders - Caller/Agency: [place] with [facility]  Callback Number: [phone]  Requesting OT/PT/Skilled Nursing/Social Work/Speech Therapy: PT  Frequency: 2 x week 4 weeks, 1 x 4 weeks

## 2022-04-11 ENCOUNTER — Other Ambulatory Visit: Payer: Self-pay | Admitting: Family Medicine

## 2022-04-11 MED ORDER — LOKELMA 5 G PO PACK: 10.0000 g | PACK | Freq: Every day | ORAL | 0 refills | Status: AC

## 2022-04-18 ENCOUNTER — Telehealth: Payer: Self-pay | Admitting: *Deleted

## 2022-04-18 NOTE — Progress Notes (Signed)
  Care Coordination Note  04/18/2022 Name: Kathryn [name] MRN: [id number] DOB: [birth year]  Kathryn [name] is a 89 y.o. year old female who is a primary care patient of [name], Mak[place], MD and is actively engaged with the care management team. I reached out to Thom Chimes by phone today to assist with re-scheduling an initial visit with the RN Case Manager  Follow up plan: Unsuccessful telephone outreach attempt made. A HIPAA compliant phone message was left for the patient providing contact information and requesting a return call.   Julian Hy, [place] Direct Dial: [phone]

## 2022-04-18 NOTE — Progress Notes (Signed)
  Care Coordination Note  04/18/2022 Name: CING Matteson MRN: [id number] DOB: 15-Mar-1934  Kathryn [name] is a 89 y.o. year old female who is a primary care patient of [name], Makiera, MD and is actively engaged with the care management team. I reached out to [employer]  by phone today to assist with re-scheduling an initial visit with the RN Case Manager  Follow up plan: Patient declines further follow up and engagement by the care management team. Appropriate care team members and provider have been notified via electronic communication.   Julian Hy, [place] Direct Dial: [phone]

## 2022-04-19 ENCOUNTER — Ambulatory Visit: Payer: Medicare HMO | Attending: Family | Admitting: Family

## 2022-04-19 ENCOUNTER — Encounter: Payer: Self-pay | Admitting: Family

## 2022-04-19 VITALS — BP 129/65 | HR 76 | Resp 18 | Wt 180.0 lb

## 2022-04-19 DIAGNOSIS — E785 Hyperlipidemia, unspecified: Secondary | ICD-10-CM | POA: Diagnosis not present

## 2022-04-19 DIAGNOSIS — E119 Type 2 diabetes mellitus without complications: Secondary | ICD-10-CM

## 2022-04-19 DIAGNOSIS — E039 Hypothyroidism, unspecified: Secondary | ICD-10-CM | POA: Diagnosis not present

## 2022-04-19 DIAGNOSIS — J449 Chronic obstructive pulmonary disease, unspecified: Secondary | ICD-10-CM | POA: Diagnosis not present

## 2022-04-19 DIAGNOSIS — E1151 Type 2 diabetes mellitus with diabetic peripheral angiopathy without gangrene: Secondary | ICD-10-CM | POA: Insufficient documentation

## 2022-04-19 DIAGNOSIS — I4891 Unspecified atrial fibrillation: Secondary | ICD-10-CM | POA: Diagnosis not present

## 2022-04-19 DIAGNOSIS — Z7984 Long term (current) use of oral hypoglycemic drugs: Secondary | ICD-10-CM | POA: Diagnosis not present

## 2022-04-19 DIAGNOSIS — I471 Supraventricular tachycardia, unspecified: Secondary | ICD-10-CM | POA: Diagnosis not present

## 2022-04-19 DIAGNOSIS — I110 Hypertensive heart disease with heart failure: Secondary | ICD-10-CM | POA: Diagnosis not present

## 2022-04-19 DIAGNOSIS — I5022 Chronic systolic (congestive) heart failure: Secondary | ICD-10-CM | POA: Diagnosis not present

## 2022-04-19 DIAGNOSIS — Z87891 Personal history of nicotine dependence: Secondary | ICD-10-CM | POA: Insufficient documentation

## 2022-04-19 DIAGNOSIS — I10 Essential (primary) hypertension: Secondary | ICD-10-CM

## 2022-04-19 DIAGNOSIS — Z8249 Family history of ischemic heart disease and other diseases of the circulatory system: Secondary | ICD-10-CM | POA: Diagnosis not present

## 2022-04-19 DIAGNOSIS — I890 Lymphedema, not elsewhere classified: Secondary | ICD-10-CM | POA: Diagnosis not present

## 2022-04-19 NOTE — Patient Instructions (Addendum)
Continue weighing daily and call for an overnight weight gain of 3 pounds or more or a weekly weight gain of more than 5 pounds.   If you have voicemail, please make sure your mailbox is cleaned out so that we may leave a message and please make sure to listen to any voicemails.    Start taking your fluid pill alternating as 1 tablet one day and 2 tablets the next day and then 1 tablet and then 2 tablets and continue this pattern.

## 2022-04-19 NOTE — Progress Notes (Signed)
Patient ID: Kathryn [name], female    DOB: 1933/09/14, 89 y.o.   MRN: [id number]  Ms. Godette is a 89 y/o female with a history of atrial fibrillation, DM, hyperlipidemia, HTN, thyroid disease, PVD, gastric ulcer, COPD, previous tobacco use and chronic heart failure.   Echo report from 03/28/20 reviewed and showed an EF of 40-45% along with mild LAE and mild MR/ AR.   Admitted 03/21/22 due to a/c heart failure. Diuresed. Admitted 03/13/22 due to COPD exacerbation.   She presents today for a follow-up visit with a chief complaint of moderate SOB with exertion. Describes this as chronic although does feel like it has worsened "lately". Has associated fatigue, pedal edema, palpitations, fluctuating weight and chronic pain along with this. Denies any difficulty sleeping, abdominal distention, chest pain, cough or dizziness.   Now taking '20mg'$  torsemide daily, instead of '40mg'$  daily.    Past Medical History:  Diagnosis Date   Actinic keratosis    Aortic atherosclerosis ([place]) 05/25/2020   Arthritis    spinal stenosis   Cardiomyopathy - presumed to be nonischemic    a. 03/2020 Echo: EF 40-45%, gr2 DD. Nl RV fxn. Mildly dil LA. Mild MR/ao sclerosis; b. 03/2020 MV: EF 30-44%, no ischemia. Cor Ca2+ in pLAD.   CHF (congestive heart failure) (HCC)    COPD (chronic obstructive pulmonary disease) (HCC)    Coronary artery calcification seen on CT scan    a. 03/2020 MV: CT attenuation images show Ao and mod pLAD Ca2+.   Diabetes mellitus without complication ([place])    Gastric ulcer 06/2010   treated with Prolisec- states no problems now   Hyperlipidemia    Hypertension    PCP Dr Miguel Aschoff   [place]   Hypothyroidism    Left bundle branch block (LBBB)    Neuromuscular disorder (HCC)    slight numbness right toes- comes and goes   Peripheral vascular disease (HCC)    varicose veins left leg   Pneumonia    PSVT (paroxysmal supraventricular tachycardia)    a. 03/2020 Zio: Avg HR 87 (66-211).  4  beats NSVT.  43 PSVT episodes, longest 10h 52m@ avg of 153 bpm (max 211).  Seen by EP-->amio started (pt wished to avoid EPS/RFCA).   Seasonal allergies    Shortness of breath    Skin cancer    multiple from face   Squamous cell carcinoma of skin 03/23/2013   L dorsal hand   Squamous cell carcinoma of skin 02/04/2014   R forearm/in situ, L pretibial   Squamous cell carcinoma of skin 09/24/2018   R thumb   Squamous cell carcinoma of skin 09/08/2019   L forearm - ED&C   Squamous cell carcinoma of skin 09/08/2019   R dosal hand - ED&C   Squamous cell carcinoma of skin 06/20/2021   R cheek preauricular, excised   Past Surgical History:  Procedure Laterality Date   BACK SURGERY     4/12  Dr AShellia Carwin for lumbar stenosis   BREAST CYST ASPIRATION Left 1965   negative   [place]  lumpect[place] with biopsy   left   CARPAL TUNNEL RELEASE     right   COLONOSCOPY     COLONOSCOPY WITH PROPOFOL N/A 06/30/2015   Procedure: COLONOSCOPY WITH PROPOFOL;  Surgeon: PHulen Luster MD;  [facility];  Service: Gastroenterology;  Laterality: N/A;   COLONOSCOPY WITH PROPOFOL N/A 01/22/2017   Procedure: COLONOSCOPY WITH PROPOFOL;  Surgeon: SLollie Sails  MD;  Location: [facility] ENDOSCOPY;  Service: Endoscopy;  Laterality: N/A;   COLONOSCOPY WITH PROPOFOL N/A 01/28/2019   Procedure: COLONOSCOPY WITH PROPOFOL;  Surgeon: Toledo, Benay Pike, MD;  Location: [facility] ENDOSCOPY;  Service: Gastroenterology;  Laterality: N/A;   ESOPHAGOGASTRODUODENOSCOPY     ESOPHAGOGASTRODUODENOSCOPY (EGD) WITH PROPOFOL N/A 01/28/2019   Procedure: ESOPHAGOGASTRODUODENOSCOPY (EGD) WITH PROPOFOL;  Surgeon: Toledo, Benay Pike, MD;  Location: [facility] ENDOSCOPY;  Service: Gastroenterology;  Laterality: N/A;   EYE SURGERY     cataract extraction with IOL bilaterally   JOINT REPLACEMENT     LUMBAR LAMINECTOMY/DECOMPRESSION MICRODISCECTOMY  05/24/2011   Procedure: LUMBAR LAMINECTOMY/DECOMPRESSION MICRODISCECTOMY 2 LEVELS;   Surgeon: Magnus Sinning, MD;  Location: [facility];  Service: Orthopedics;  Laterality: N/A;  L2-L3, L3-L4 (x-ray)   RIGHT OOPHORECTOMY     due to STAPH INFECTION   Family History  Problem Relation Age of Onset   Dementia Mother    Lung cancer Father    Heart disease Father    Hypertension Sister    Breast cancer Maternal Aunt    Arthritis Son    Social History   Tobacco Use   Smoking status: Former    Packs/day: 0.50    Years: 50.00    Total pack years: 25.00    Types: Cigarettes    Quit date: 05/16/2004    Years since quitting: 17.9   Smokeless tobacco: Never  Substance Use Topics   Alcohol use: Yes    Comment: socially-1 glass of wine or [place] icea tea once a month   Allergies  Allergen Reactions   Oxycodone Other (See Comments)    Mental status change   Prednisone     swelling, bruising.   Prior to Admission medications   Medication Sig Start Date End Date Taking? Authorizing Provider  acetaminophen (TYLENOL) 500 MG tablet Take 1,000 mg by mouth every 6 (six) hours as needed. Pain    Yes [provider]  albuterol (PROVENTIL) (2.5 MG/3ML) 0.083% nebulizer solution USE 1 VIAL VIA NEBULIZER EVERY 6 HOURS AS NEEDED FOR WHEEZING OR SHORTNESS OF BREATH Patient taking differently: Take 2.5 mg by nebulization every 6 (six) hours as needed for wheezing or shortness of breath. 10/23/21  Yes Jerrol Banana., MD  amiodarone (PACERONE) 200 MG tablet Take 0.5 tablets (100 mg total) by mouth daily. 12/27/21  Yes Vickie Epley, MD  aspirin 81 MG EC tablet Take 81 mg by mouth daily. Swallow whole.   Yes [provider]  atorvastatin (LIPITOR) 40 MG tablet TAKE 1 TABLET(40 MG) BY MOUTH DAILY Patient taking differently: Take 40 mg by mouth daily. TAKE 1 TABLET(40 MG) BY MOUTH DAILY 01/29/22  Yes Gollan, Kathlene November, MD  carvedilol (COREG) 6.25 MG tablet TAKE 1 TABLET(6.25 MG) BY MOUTH TWICE DAILY WITH A MEAL Patient taking differently: Take 6.25 mg by  mouth 2 (two) times daily with a meal. TAKE 1 TABLET(6.25 MG) BY MOUTH TWICE DAILY WITH A MEAL 01/29/22  Yes Gollan, Kathlene November, MD  dapagliflozin propanediol (FARXIGA) 10 MG TABS tablet Take 1 tablet (10 mg total) by mouth daily. 03/12/22  Yes Gollan, Kathlene November, MD  gabapentin (NEURONTIN) 400 MG capsule Take 1 capsule (400 mg total) by mouth 4 (four) times daily. 04/05/22  Yes [name], Makiera, MD  glucose blood (CONTOUR NEXT TEST) test strip Check sugar once daily  DX E11.9 11/16/16  Yes Jerrol Banana., MD  hydrOXYzine (ATARAX) 10 MG tablet TAKE 1 TO 2 TABLETS(10 TO 20 MG)  BY MOUTH EVERY 6 HOURS AS NEEDED Patient taking differently: Take 10-20 mg by mouth every 6 (six) hours as needed for anxiety. TAKE 1 TO 2 TABLETS(10 TO 20 MG) BY MOUTH EVERY 6 HOURS AS NEEDED 03/07/22  Yes Rumball, Jake Church, DO  levothyroxine (SYNTHROID) 100 MCG tablet TAKE 1 TABLET(100 MCG) BY MOUTH DAILY BEFORE BREAKFAST Patient taking differently: Take 100 mcg by mouth daily before breakfast. TAKE 1 TABLET(100 MCG) BY MOUTH DAILY BEFORE BREAKFAST 02/26/22  Yes Myles Gip, DO  losartan (COZAAR) 25 MG tablet Take 1 tablet (25 mg total) by mouth every evening. 07/17/21  Yes Erron Wengert A, FNP  metFORMIN (GLUCOPHAGE) 1000 MG tablet TAKE 1 TABLET(1000 MG) BY MOUTH DAILY WITH SUPPER Patient taking differently: Take 1,000 mg by mouth daily with supper. TAKE 1 TABLET(1000 MG) BY MOUTH DAILY WITH SUPPER 02/05/22  Yes Rumball, Jake Church, DO  omeprazole (PRILOSEC) 20 MG capsule TAKE 1 CAPSULE(20 MG) BY MOUTH DAILY Patient taking differently: Take 20 mg by mouth daily. 01/01/22  Yes Ostwalt, Janna, PA-C  SPIRIVA HANDIHALER 18 MCG inhalation capsule PLACE 1 CAPSULE INTO INHALER AND INHALE EVERY DAY AS NEEDED Patient taking differently: Place 18 mcg into inhaler and inhale daily as needed. 05/25/21  Yes Jerrol Banana., MD  torsemide (DEMADEX) 20 MG tablet Take 1 tablet (20 mg total) by mouth daily. 03/24/22  Yes  [place] Donnamarie Poag, DO    Review of Systems  Constitutional:  Positive for fatigue. Negative for appetite change.  HENT:  Negative for congestion, rhinorrhea and sore throat.   Eyes: Negative.   Respiratory:  Positive for shortness of breath. Negative for cough and chest tightness.   Cardiovascular:  Positive for palpitations (when feels SOB) and leg swelling. Negative for chest pain.  Gastrointestinal:  Negative for abdominal distention and abdominal pain.  Endocrine: Negative.   Genitourinary: Negative.   Musculoskeletal:  Positive for arthralgias (shoulders) and back pain.  Skin: Negative.   Allergic/Immunologic: Negative.   Neurological:  Negative for dizziness and light-headedness.  Hematological:  Negative for adenopathy. Does not bruise/bleed easily.  Psychiatric/Behavioral:  Negative for dysphoric mood and sleep disturbance (sleeping on 1-2 pillows). The patient is not nervous/anxious.    Vitals:   04/19/22 1459  BP: 129/65  Pulse: 76  Resp: 18  SpO2: 100%  Weight: 180 lb (81.6 kg)   Wt Readings from Last 3 Encounters:  04/19/22 180 lb (81.6 kg)  04/05/22 181 lb 14.4 oz (82.5 kg)  03/29/22 182 lb (82.6 kg)   Lab Results  Component Value Date   CREATININE 1.21 (H) 04/09/2022   CREATININE 1.30 (H) 04/05/2022   CREATININE 1.07 (H) 03/24/2022   Physical Exam Vitals and nursing note reviewed. Exam conducted with a chaperone present (son).  Constitutional:      Appearance: She is well-developed.  HENT:     Head: Normocephalic and atraumatic.  Neck:     Vascular: No JVD.  Cardiovascular:     Rate and Rhythm: Normal rate and regular rhythm.  Pulmonary:     Effort: Pulmonary effort is normal. No respiratory distress.     Breath sounds: No wheezing or rales.  Abdominal:     Palpations: Abdomen is soft.     Tenderness: There is no abdominal tenderness.  Musculoskeletal:     Cervical back: Neck supple.     Right lower leg: No tenderness. Edema (1+ pitting) present.      Left lower leg: No tenderness. Edema (1+ pitting) present.  Skin:  General: Skin is warm and dry.  Neurological:     General: No focal deficit present.     Mental Status: She is alert and oriented to person, place, and time.  Psychiatric:        Mood and Affect: Mood normal.        Behavior: Behavior normal.    Assessment & Plan:  1: Chronic heart failure with reduced ejection fraction- - NYHA class III - minimally fluid overloaded today with swelling and worsening SOB - weighing daily and reminded to call for an overnight weight gain of > 2 pounds or a weekly weight gain of >5 pounds - not adding salt and is using Mrs Deliah Boston for seasoning - saw [facility] cardiology Rockey Situ) 08/28/21 - on GDMT of carvedilol, losartan, farxiga - have scheduled echo for 05/24/22 (last one done was 2 years prior) - will increase torsemide to '20mg'$  QOD alternating with '40mg'$  QOD - BMP next visit - BNP 03/21/22 was 676.4  2: HTN- - BP 129/65 - saw PCP ([name]) 04/05/22 - BMP 04/09/22 reviewed and showed sodium 142, potassium 5.4, creatinine 1.21 and GFR 44  3: Diabetes- - A1c 12/12/21 was 6.9%  4: PSVT- - saw EP Quentin Ore) 12/27/21 - continues on amiodarone  - RRR today  - possible ablation in the future if needed  5: Lymphedema- - saw vascular (Schnier) 03/29/22 - has wound center appt in 2 weeks - wearing compression socks daily with removal at bedtime with some improvement of swelling - tries to elevate legs when sitting for long periods of time   Patient did not bring her medications nor a list. Each medication was verbally reviewed with the patient and she was encouraged to bring the bottles to every visit to confirm accuracy of list.  Return in 2 weeks,sooner if needed.

## 2022-04-25 ENCOUNTER — Telehealth: Payer: Self-pay

## 2022-04-25 NOTE — Telephone Encounter (Signed)
Copied from [place] [id number]. Topic: General - Other >> Apr 25, 2022  4:38 PM Cyndi Bender wrote: Reason for CRM: Kathryn [name] with [facility] reports patient had a fall last Friday 04/20/22 but she denies any pain or injury. Cb# [phone]

## 2022-04-26 NOTE — Telephone Encounter (Signed)
Acknowledged. Please have patient schedule follow up office visit if she begins to have pain or continues to have falls   [name], MD  [facility]

## 2022-04-27 NOTE — Telephone Encounter (Signed)
Patient advised. Report that she is fine.

## 2022-05-05 NOTE — Progress Notes (Unsigned)
Patient ID: Kathryn [name], female    DOB: [birth year], 89 y.o.   MRN: [id number]  Kathryn [name] is a 89 y/o female with a history of atrial fibrillation, DM, hyperlipidemia, HTN, thyroid disease, PVD, gastric ulcer, COPD, previous tobacco use and chronic heart failure.   Echo report from 03/28/20 reviewed and showed an EF of 40-45% along with mild LAE and mild MR/ AR.   Admitted 03/21/22 due to a/c heart failure. Diuresed. Admitted 03/13/22 due to COPD exacerbation.   She presents today for a follow-up visit with a chief complaint of moderate fatigue with minimal exertion. Describes this as chronic in nature. Has associated SOB, pedal edema, palpitations, light-headedness and chronic pain along with this. Denies any difficulty sleeping, cough, chest pain, abdominal distention or weight gain.   Has been taking her torsemide as 64m QOD alternating with 49mQOD.   Went to wound center earlier today for an area on her right great toe.   Past Medical History:  Diagnosis Date   Actinic keratosis    [place]   Arthritis    spinal stenosis   Cardiomyopathy - presumed to be nonischemic    a. 03/2020 Echo: EF 40-45%, gr2 DD. Nl RV fxn. Mildly dil LA. Mild MR/ao sclerosis; b. 03/2020 MV: EF 30-44%, no ischemia. Cor Ca2+ in pLAD.   CHF (congestive heart failure) (HCC)    COPD (chronic obstructive pulmonary disease) (HCC)    Coronary artery calcification seen on CT scan    a. 03/2020 MV: CT attenuation images show Ao and mod pLAD Ca2+.   [place]   Gastric ulcer 06/2010   treated with Prolisec- states no problems now   Hyperlipidemia    Hypertension    PCP Dr RiMiguel Aschoff [place]   Hypothyroidism    Left bundle branch block (LBBB)    Neuromuscular disorder (HCC)    slight numbness right toes- comes and goes   Peripheral vascular disease (HCC)    varicose veins left leg   Pneumonia    PSVT (paroxysmal supraventricular  tachycardia)    a. 03/2020 Zio: Avg HR 87 (66-211).  4 beats NSVT.  43 PSVT episodes, longest 10h 3521mavg of 153 bpm (max 211).  Seen by EP-->amio started (pt wished to avoid EPS/RFCA).   Seasonal allergies    Shortness of breath    Skin cancer    multiple from face   Squamous cell carcinoma of skin 03/23/2013   L dorsal hand   Squamous cell carcinoma of skin 02/04/2014   R forearm/in situ, L pretibial   Squamous cell carcinoma of skin 09/24/2018   R thumb   Squamous cell carcinoma of skin 09/08/2019   L forearm - ED&C   Squamous cell carcinoma of skin 09/08/2019   R dosal hand - ED&C   Squamous cell carcinoma of skin 06/20/2021   R cheek preauricular, excised   Past Surgical History:  Procedure Laterality Date   BACK SURGERY     4/12  Dr AplShellia Carwinor lumbar stenosis   BREAST CYST ASPIRATION Left 1965   negative   [place]umpectomy with biopsy   left   CARPAL TUNNEL RELEASE     right   COLONOSCOPY     COLONOSCOPY WITH PROPOFOL N/A 06/30/2015   Procedure: COLONOSCOPY WITH PROPOFOL;  Surgeon: PauHulen LusterD;  [facility];  Service: Gastroenterology;  Laterality: N/A;   COLONOSCOPY WITH PROPOFOL  N/A 01/22/2017   Procedure: COLONOSCOPY WITH PROPOFOL;  Surgeon: Lollie Sails, MD;  Location: [facility] END[facility]COPY;  Service: Endoscopy;  Laterality: N/A;   COLONOSCOPY WITH PROPOFOL N/A 01/28/2019   Procedure: COLONOSCOPY WITH PROPOFOL;  Surgeon: Toledo, Benay Pike, MD;  Location: [facility] ENDOSCOPY;  Service: Gastroenterology;  Laterality: N/A;   ESOPHAGOGASTRODUODENOSCOPY     ESOPHAGOGASTRODUODENOSCOPY (EGD) WITH PROPOFOL N/A 01/28/2019   Procedure: ESOPHAGOGASTRODUODENOSCOPY (EGD) WITH PROPOFOL;  Surgeon: Toledo, Benay Pike, MD;  Location: [facility] END[facility]COPY;  Service: Gastroenterology;  Laterality: N/A;   EYE SURGERY     cataract extraction with IOL bilaterally   JOINT REPLACEMENT     LUMBAR LAMINECTOMY/DECOMPRESSION MICRODISCECTOMY  05/24/2011   Procedure: LUMBAR  LAMINECTOMY/DECOMPRESSION MICRODISCECTOMY 2 LEVELS;  Surgeon: Magnus Sinning, MD;  [facility];  Service: Orthopedics;  Laterality: N/A;  L2-L3, L3-L4 (x-ray)   RIGHT OOPHORECTOMY     due to STAPH INFECTION   Family History  Problem Relation Age of Onset   Dementia Mother    Lung cancer Father    Heart disease Father    Hypertension Sister    Breast cancer Maternal Aunt    Arthritis Son    Social History   Tobacco Use   Smoking status: Former    Packs/day: 0.50    Years: 50.00    Total pack years: 25.00    Types: Cigarettes    Quit date: 05/16/2004    Years since quitting: 17.9   Smokeless tobacco: Never  Substance Use Topics   Alcohol use: Yes    Comment: socially-1 glass of wine or [place] icea tea once a month   Allergies  Allergen Reactions   Oxycodone Other (See Comments)    Mental status change   Prednisone     swelling, bruising.   Prior to Admission medications   Medication Sig Start Date End Date Taking? Authorizing Provider  acetaminophen (TYLENOL) 500 MG tablet Take 1,000 mg by mouth every 6 (six) hours as needed. Pain    Yes [provider]  albuterol (PROVENTIL) (2.5 MG/3ML) 0.083% nebulizer solution USE 1 VIAL VIA NEBULIZER EVERY 6 HOURS AS NEEDED FOR WHEEZING OR SHORTNESS OF BREATH Patient taking differently: Take 2.5 mg by nebulization every 6 (six) hours as needed for wheezing or shortness of breath. 10/23/21  Yes Eulas Post, MD  amiodarone (PACERONE) 200 MG tablet Take 0.5 tablets (100 mg total) by mouth daily. 12/27/21  Yes Vickie Epley, MD  aspirin 81 MG EC tablet Take 81 mg by mouth daily. Swallow whole.   Yes [provider]  atorvastatin (LIPITOR) 40 MG tablet TAKE 1 TABLET(40 MG) BY MOUTH DAILY 05/07/22  Yes Gollan, Kathlene November, MD  carvedilol (COREG) 6.25 MG tablet TAKE 1 TABLET(6.25 MG) BY MOUTH TWICE DAILY WITH A MEAL 05/07/22  Yes Gollan, Kathlene November, MD  dapagliflozin propanediol (FARXIGA) 10 MG TABS tablet  Take 1 tablet (10 mg total) by mouth daily. 03/12/22  Yes Gollan, Kathlene November, MD  gabapentin (NEURONTIN) 400 MG capsule Take 1 capsule (400 mg total) by mouth 4 (four) times daily. 04/05/22  Yes [name], Makiera, MD  glucose blood (CONTOUR NEXT TEST) test strip Check sugar once daily  DX E11.9 11/16/16  Yes Eulas Post, MD  hydrOXYzine (ATARAX) 10 MG tablet TAKE 1 TO 2 TABLETS(10 TO 20 MG) BY MOUTH EVERY 6 HOURS AS NEEDED Patient taking differently: Take 10-20 mg by mouth every 6 (six) hours as needed for anxiety. TAKE 1 TO 2 TABLETS(10 TO 20 MG)  BY MOUTH EVERY 6 HOURS AS NEEDED 03/07/22  Yes Rumball, Jake Church, DO  levothyroxine (SYNTHROID) 100 MCG tablet TAKE 1 TABLET(100 MCG) BY MOUTH DAILY BEFORE BREAKFAST Patient taking differently: Take 100 mcg by mouth daily before breakfast. TAKE 1 TABLET(100 MCG) BY MOUTH DAILY BEFORE BREAKFAST 02/26/22  Yes Myles Gip, DO  losartan (COZAAR) 25 MG tablet Take 1 tablet (25 mg total) by mouth every evening. 07/17/21  Yes Reina Wilton A, FNP  metFORMIN (GLUCOPHAGE) 1000 MG tablet TAKE 1 TABLET(1000 MG) BY MOUTH DAILY WITH SUPPER Patient taking differently: Take 1,000 mg by mouth daily with supper. TAKE 1 TABLET(1000 MG) BY MOUTH DAILY WITH SUPPER 02/05/22  Yes Rumball, Jake Church, DO  omeprazole (PRILOSEC) 20 MG capsule TAKE 1 CAPSULE(20 MG) BY MOUTH DAILY Patient taking differently: Take 20 mg by mouth daily. 01/01/22  Yes Ostwalt, Janna, PA-C  SPIRIVA HANDIHALER 18 MCG inhalation capsule PLACE 1 CAPSULE INTO INHALER AND INHALE EVERY DAY AS NEEDED Patient taking differently: Place 18 mcg into inhaler and inhale daily as needed. 05/25/21  Yes Eulas Post, MD  torsemide (DEMADEX) 20 MG tablet Take 1 tablet (20 mg total) by mouth QOD with 35m QOD 03/24/22  Yes [place] EDonnamarie Poag DO   Review of Systems  Constitutional:  Positive for fatigue. Negative for appetite change.  HENT:  Negative for congestion, rhinorrhea and sore throat.   Eyes:  Negative.   Respiratory:  Positive for shortness of breath. Negative for cough and chest tightness.   Cardiovascular:  Positive for palpitations (when feels SOB) and leg swelling. Negative for chest pain.  Gastrointestinal:  Negative for abdominal distention and abdominal pain.  Endocrine: Negative.   Genitourinary: Negative.   Musculoskeletal:  Positive for arthralgias (shoulders) and back pain.  Skin:  Positive for color change (right great toe (wound center)).  Allergic/Immunologic: Negative.   Neurological:  Positive for light-headedness (at times). Negative for dizziness.  Hematological:  Negative for adenopathy. Does not bruise/bleed easily.  Psychiatric/Behavioral:  Negative for dysphoric mood and sleep disturbance (sleeping on 1-2 pillows). The patient is not nervous/anxious.    Vitals:   05/07/22 1456  BP: (!) 118/59  Pulse: 77  Resp: 18  SpO2: 98%  Weight: 181 lb (82.1 kg)   Wt Readings from Last 3 Encounters:  05/07/22 181 lb (82.1 kg)  04/19/22 180 lb (81.6 kg)  04/05/22 181 lb 14.4 oz (82.5 kg)   Lab Results  Component Value Date   CREATININE 1.22 (H) 05/07/2022   CREATININE 1.21 (H) 04/09/2022   CREATININE 1.30 (H) 04/05/2022   Physical Exam Vitals and nursing note reviewed. Exam conducted with a chaperone present (son).  Constitutional:      Appearance: She is well-developed.  HENT:     Head: Normocephalic and atraumatic.  Neck:     Vascular: No JVD.  Cardiovascular:     Rate and Rhythm: Normal rate and regular rhythm.  Pulmonary:     Effort: Pulmonary effort is normal. No respiratory distress.     Breath sounds: No wheezing or rales.  Abdominal:     Palpations: Abdomen is soft.     Tenderness: There is no abdominal tenderness.  Musculoskeletal:     Cervical back: Neck supple.     Right lower leg: No tenderness. Edema (1+ pitting) present.     Left lower leg: No tenderness. Edema (1+ pitting) present.  Skin:    General: Skin is warm and dry.   Neurological:     General: No focal  deficit present.     Mental Status: She is alert and oriented to person, place, and time.  Psychiatric:        Mood and Affect: Mood normal.        Behavior: Behavior normal.    Assessment & Plan:  1: Chronic heart failure with reduced ejection fraction- - NYHA class III - euvolemic - weighing daily and reminded to call for an overnight weight gain of > 2 pounds or a weekly weight gain of >5 pounds - weight up 1.6 pounds from last visit here 2 weeks ago - not adding salt and is using Mrs Deliah Boston for seasoning - saw [facility] cardiology Rockey Situ) 08/28/21 - on GDMT of carvedilol, losartan, farxiga - has echo on 05/24/22  - BMP today and will consider med adjustment pending these results - spiro may not be an option as K+ tends to trend high - BNP 03/21/22 was 676.4  2: HTN- - BP 118/59 - saw PCP ([name]) 04/05/22 - BMP 04/09/22 reviewed and showed sodium 142, potassium 5.4, creatinine 1.21 and GFR 44  3: Diabetes- - A1c 12/12/21 was 6.9%  4: PSVT- - saw EP Quentin Ore) 12/27/21 - continues on amiodarone  - RRR today  - possible ablation in the future if needed  5: Lymphedema- - saw vascular (Schnier) 03/29/22 - saw wound center earlier today - wearing compression socks daily with removal at bedtime with some improvement of swelling - tries to elevate legs when sitting for long periods of time but admits that she doesn't do this as often as possible   Medication list reviewed.   Return in 1 month, sooner if needed.

## 2022-05-07 ENCOUNTER — Other Ambulatory Visit: Payer: Self-pay | Admitting: Cardiovascular Disease

## 2022-05-07 ENCOUNTER — Encounter: Payer: Medicare HMO | Attending: Physician Assistant | Admitting: Physician Assistant

## 2022-05-07 ENCOUNTER — Other Ambulatory Visit
Admission: RE | Admit: 2022-05-07 | Discharge: 2022-05-07 | Disposition: A | Discharge status: Home / Self Care | Payer: Medicare HMO | Source: Ambulatory Visit | Attending: Family | Admitting: Family

## 2022-05-07 ENCOUNTER — Encounter: Payer: Self-pay | Admitting: Family

## 2022-05-07 ENCOUNTER — Ambulatory Visit (HOSPITAL_BASED_OUTPATIENT_CLINIC_OR_DEPARTMENT_OTHER): Payer: Medicare HMO | Admitting: Family

## 2022-05-07 VITALS — BP 118/59 | HR 77 | Resp 18 | Wt 181.0 lb

## 2022-05-07 DIAGNOSIS — Z7984 Long term (current) use of oral hypoglycemic drugs: Secondary | ICD-10-CM | POA: Diagnosis not present

## 2022-05-07 DIAGNOSIS — I2510 Atherosclerotic heart disease of native coronary artery without angina pectoris: Secondary | ICD-10-CM | POA: Insufficient documentation

## 2022-05-07 DIAGNOSIS — I480 Paroxysmal atrial fibrillation: Secondary | ICD-10-CM | POA: Insufficient documentation

## 2022-05-07 DIAGNOSIS — I110 Hypertensive heart disease with heart failure: Secondary | ICD-10-CM | POA: Insufficient documentation

## 2022-05-07 DIAGNOSIS — Z90721 Acquired absence of ovaries, unilateral: Secondary | ICD-10-CM | POA: Diagnosis not present

## 2022-05-07 DIAGNOSIS — E079 Disorder of thyroid, unspecified: Secondary | ICD-10-CM | POA: Insufficient documentation

## 2022-05-07 DIAGNOSIS — I5022 Chronic systolic (congestive) heart failure: Secondary | ICD-10-CM

## 2022-05-07 DIAGNOSIS — R600 Localized edema: Secondary | ICD-10-CM | POA: Insufficient documentation

## 2022-05-07 DIAGNOSIS — M069 Rheumatoid arthritis, unspecified: Secondary | ICD-10-CM | POA: Diagnosis not present

## 2022-05-07 DIAGNOSIS — I471 Supraventricular tachycardia, unspecified: Secondary | ICD-10-CM | POA: Diagnosis not present

## 2022-05-07 DIAGNOSIS — E1140 Type 2 diabetes mellitus with diabetic neuropathy, unspecified: Secondary | ICD-10-CM | POA: Diagnosis not present

## 2022-05-07 DIAGNOSIS — L97512 Non-pressure chronic ulcer of other part of right foot with fat layer exposed: Secondary | ICD-10-CM | POA: Insufficient documentation

## 2022-05-07 DIAGNOSIS — E785 Hyperlipidemia, unspecified: Secondary | ICD-10-CM | POA: Insufficient documentation

## 2022-05-07 DIAGNOSIS — R002 Palpitations: Secondary | ICD-10-CM | POA: Insufficient documentation

## 2022-05-07 DIAGNOSIS — E11621 Type 2 diabetes mellitus with foot ulcer: Secondary | ICD-10-CM | POA: Diagnosis present

## 2022-05-07 DIAGNOSIS — I10 Essential (primary) hypertension: Secondary | ICD-10-CM

## 2022-05-07 DIAGNOSIS — E1151 Type 2 diabetes mellitus with diabetic peripheral angiopathy without gangrene: Secondary | ICD-10-CM | POA: Diagnosis not present

## 2022-05-07 DIAGNOSIS — J449 Chronic obstructive pulmonary disease, unspecified: Secondary | ICD-10-CM | POA: Insufficient documentation

## 2022-05-07 DIAGNOSIS — K259 Gastric ulcer, unspecified as acute or chronic, without hemorrhage or perforation: Secondary | ICD-10-CM | POA: Insufficient documentation

## 2022-05-07 DIAGNOSIS — I890 Lymphedema, not elsewhere classified: Secondary | ICD-10-CM | POA: Insufficient documentation

## 2022-05-07 DIAGNOSIS — R42 Dizziness and giddiness: Secondary | ICD-10-CM | POA: Diagnosis not present

## 2022-05-07 DIAGNOSIS — R0602 Shortness of breath: Secondary | ICD-10-CM | POA: Diagnosis not present

## 2022-05-07 DIAGNOSIS — Z79899 Other long term (current) drug therapy: Secondary | ICD-10-CM | POA: Insufficient documentation

## 2022-05-07 DIAGNOSIS — I5042 Chronic combined systolic (congestive) and diastolic (congestive) heart failure: Secondary | ICD-10-CM | POA: Insufficient documentation

## 2022-05-07 DIAGNOSIS — G8929 Other chronic pain: Secondary | ICD-10-CM | POA: Insufficient documentation

## 2022-05-07 DIAGNOSIS — E119 Type 2 diabetes mellitus without complications: Secondary | ICD-10-CM | POA: Diagnosis not present

## 2022-05-07 DIAGNOSIS — Z85828 Personal history of other malignant neoplasm of skin: Secondary | ICD-10-CM | POA: Insufficient documentation

## 2022-05-07 DIAGNOSIS — Z87891 Personal history of nicotine dependence: Secondary | ICD-10-CM | POA: Insufficient documentation

## 2022-05-07 DIAGNOSIS — E039 Hypothyroidism, unspecified: Secondary | ICD-10-CM | POA: Insufficient documentation

## 2022-05-07 LAB — BASIC METABOLIC PANEL
Anion gap: 10 (ref 5–15)
BUN: 38 mg/dL — ABNORMAL HIGH (ref 8–23)
CO2: 26 mmol/L (ref 22–32)
Calcium: 9.8 mg/dL (ref 8.9–10.3)
Chloride: 106 mmol/L (ref 98–111)
Creatinine, Ser: 1.22 mg/dL — ABNORMAL HIGH (ref 0.44–1.00)
GFR, Estimated: 43 mL/min — ABNORMAL LOW (ref >60)
Glucose, Bld: 121 mg/dL — ABNORMAL HIGH (ref 70–99)
Potassium: 5.3 mmol/L — ABNORMAL HIGH (ref 3.5–5.1)
Sodium: 142 mmol/L (ref 135–145)

## 2022-05-07 NOTE — Patient Instructions (Signed)
Continue weighing daily and call for an overnight weight gain of 3 pounds or more or a weekly weight gain of more than 5 pounds. ? ? ?If you have voicemail, please make sure your mailbox is cleaned out so that we may leave a message and please make sure to listen to any voicemails.  ? ? ?

## 2022-05-09 NOTE — Progress Notes (Signed)
CINDEL, LACAYO ([id number]) [phone].pdf Page 1 of 9 Visit Report for 05/07/2022 Chief Complaint Document Details Patient Name: Date of Service: Kathryn [name], [place] NNA L. 05/07/2022 1:30 PM Medical Record Number: [id number] Patient Account Number: [id number] Date of Birth/Sex: Treating RN: 09-22-1933 (89 y.o. Orvan Falconer Primary Care Provider: [facility] NS-RO Yevonne Pax, [place] Enis Gash Other Clinician: Referring Provider: Treating Provider/Extender: Jeri Cos Self, Referral Weeks in Treatment: 0 Information Obtained from: Patient Chief Complaint Right second toe ulcer Electronic Signature(s) Signed: 05/07/2022 2:13:28 PM By: Worthy Keeler PA-C Entered By: Worthy Keeler on 05/07/2022 14:13:28 -------------------------------------------------------------------------------- Debridement Details Patient Name: Date of Service: Kathryn [name], DEA NNA L. 05/07/2022 1:30 PM Medical Record Number: [id number] Patient Account Number: [id number] Date of Birth/Sex: Treating RN: 08-13-1933 (89 y.o. Orvan Falconer Primary Care Provider: [facility] NS-RO Yevonne Pax, [place] Enis Gash Other Clinician: Referring Provider: Treating Provider/Extender: Jeri Cos Self, Referral Weeks in Treatment: 0 Debridement Performed for Assessment: Wound #9 Right T Second oe Performed By: Physician Tommie Sams., PA-C Debridement Type: Debridement Severity of Tissue Pre Debridement: Fat layer exposed Level of Consciousness (Pre-procedure): Awake and Alert Pre-procedure Verification/Time Out Yes - 14:20 Taken: Start Time: 14:20 T Area Debrided (L x W): otal 0.5 (cm) x 0.5 (cm) = 0.25 (cm) Tissue and other material debrided: Viable, Non-Viable, Callus, Slough, Subcutaneous, Slough Level: Skin/Subcutaneous Tissue Debridement Description: Excisional Instrument: Curette Bleeding: Minimum Hemostasis Achieved: Pressure End Time: 14:28 Procedural Pain: 0 Post Procedural Pain: 0 Response to Treatment: Procedure was  tolerated well TRINETTE, LEDEE L ([id number] Page 2 of 9 Level of Consciousness (Post- Awake and Alert procedure): Post Debridement Measurements of Total Wound Length: (cm) 0.4 Width: (cm) 0.4 Depth: (cm) 0.3 Volume: (cm) 0.038 Character of Wound/Ulcer Post Debridement: Improved Severity of Tissue Post Debridement: Fat layer exposed Post Procedure Diagnosis Same as Pre-procedure Electronic Signature(s) Signed: 05/07/2022 5:25:48 PM By: Worthy Keeler PA-C Signed: 05/08/2022 4:20:55 PM By: Carlene Coria RN Entered By: Carlene Coria on 05/07/2022 14:25:36 -------------------------------------------------------------------------------- HPI Details Patient Name: Date of Service: Kathryn [name], DEA NNA L. 05/07/2022 1:30 PM Medical Record Number: [id number] Patient Account Number: [id number] Date of Birth/Sex: Treating RN: 10-Feb-1934 (89 y.o. Orvan Falconer Primary Care Provider: [facility] NS-RO Yevonne Pax, [place] Enis Gash Other Clinician: Referring Provider: Treating Provider/Extender: Jeri Cos Self, Referral Weeks in Treatment: 0 History of Present Illness HPI Description: 02-06-2022 upon evaluation today patient presents for initial inspection here in our clinic concerning wounds that she has over the bilateral feet as well as the right leg. She has been tolerating the dressing changes in general here without complication she has been putting antibiotic ointment on this. With that being said I do believe that she would benefit from the use of something like Xeroform gauze dressings which would likely be a bit better. Fortunately I do not see any signs of infection which is good news do not think she needs any antibiotics. She tells me that these wounds typically occur as a result of her bumping or hitting her leg and then they slowly progressed from there. Patient does have a history of diabetes for which her hemoglobin A1c was 6.9 measured on 12-12-2021. She also  has a history of arterial insufficiency with a right ABI of 0.66 with a TBI of 0.61 and a left ABI of 0.82 with a TBI of 0.45. She has a follow-up evaluation on 02-27-2022 with vascular. This testing was done on 01-28-2022. Subsequently the patient also has a history of hypertension, generalized weakness,  congestive heart failure, and lower extremity edema. 02-13-2022 upon evaluation today patient appears to be doing well currently in regard to her wounds. She is actually making some good progress here which is great news and overall I am extremely pleased with where we stand. I do not see any evidence of worsening. 02-22-2022 upon evaluation today patient's wounds actually are showing some signs of improvement for the most part. Fortunately there does not appear to be any evidence of infection locally nor systemically at this point which is great news. No fevers, chills, nausea, vomiting, or diarrhea. She does have an a vascular appointment on December 5 and we will see what they have to say at that point. 03-08-2022 upon evaluation today patient actually appears to be doing excellent in regards to her wounds. She has been tolerating the dressing changes without complication. Fortunately there is no sign of active infection locally nor systemically at this point which is great news. Readmission: 05-07-2022 upon evaluation today patient presents for reevaluation here in the clinic it has been actually December 14 since I have last seen her. Subsequently she does still have a wound on the right second toe. This is the one area that has not healed everything on the left ended up healing. Fortunately I do not see any signs of infection at this point and really nothing is changed in her medical history since the last evaluation. She just tells me that this wound is just being stubborn and does not want to heal. Electronic Signature(s) Signed: 05/07/2022 2:41:54 PM By: Worthy Keeler PA-C Entered By: Worthy Keeler on 05/07/2022 14:41:54 Thom Chimes ([id number] Page 3 of 9 -------------------------------------------------------------------------------- Physical Exam Details Patient Name: Date of Service: Kathryn [name], DEA NNA L. 05/07/2022 1:30 PM Medical Record Number: [id number] Patient Account Number: [id number] Date of Birth/Sex: Treating RN: [birth year] (89 y.o. Orvan Falconer Primary Care Provider: [facility] NS-RO Yevonne Pax, [place] Enis Gash Other Clinician: Referring Provider: Treating Provider/Extender: Jeri Cos Self, Referral Weeks in Treatment: 0 Constitutional sitting or standing blood pressure is within target range for patient.. pulse regular and within target range for patient.Marland Kitchen respirations regular, non-labored and within target range for patient.Marland Kitchen temperature within target range for patient.. Well-nourished and well-hydrated in no acute distress. Eyes conjunctiva clear no eyelid edema noted. pupils equal round and reactive to light and accommodation. Ears, Nose, Mouth, and Throat no gross abnormality of ear auricles or external auditory canals. normal hearing noted during conversation. mucus membranes moist. Respiratory normal breathing without difficulty. Cardiovascular 1+ dorsalis pedis/posterior tibialis pulses. no clubbing, cyanosis, significant edema, <3 sec cap refill. Musculoskeletal normal gait and posture. no significant deformity or arthritic changes, no loss or range of motion, no clubbing. Psychiatric this patient is able to make decisions and demonstrates good insight into disease process. Alert and Oriented x 3. pleasant and cooperative. Notes Upon inspection patient's wound bed actually showed signs of good granulation and some areas she had some slough and necrotic debris and others. I think this is part of what is keeping her from being able to heal appropriately and I discussed that with her today. I also believe that we are  going to perform debridement clearway some of the necrotic debris I did perform debridement down to actually the bone and joint capsule region. With that being said the debridement did not include any removal of the bone at this point. I do believe that she needs to likely have serial debridements to keep this under control and  subsequently we will continue to monitor for any signs of infection as well. Obviously right now I do not see obvious infection but I do see some definite issues here with inflammation in the toe being swollen and I do believe that she is going to require good wound care measures in order to get this movement in the right direction. Electronic Signature(s) Signed: 05/07/2022 2:42:53 PM By: Worthy Keeler PA-C Entered By: Worthy Keeler on 05/07/2022 14:42:53 -------------------------------------------------------------------------------- Physician Orders Details Patient Name: Date of Service: Kathryn [name], DEA NNA L. 05/07/2022 1:30 PM Medical Record Number: [id number] Patient Account Number: [id number] Date of Birth/Sex: Treating RN: 1933-12-19 (89 y.o. Orvan Falconer Primary Care Provider: [facility] NS-RO Yevonne Pax, [place] Other Clinician: Referring Provider: Treating Provider/Extender: Jeri Cos Self, Referral Thom Chimes ([id number]) [id number] Page 4 of 9 Weeks in Treatment: 0 Verbal / Phone Orders: No Diagnosis Coding ICD-10 Coding Code Description E11.621 Type 2 diabetes mellitus with foot ulcer I73.89 Other specified peripheral vascular diseases L97.512 Non-pressure chronic ulcer of other part of right foot with fat layer exposed M62.81 Muscle weakness (generalized) I50.42 Chronic combined systolic (congestive) and diastolic (congestive) heart failure I10 Essential (primary) hypertension Follow-up Appointments Return Appointment in 1 week. Bathing/ Shower/ Hygiene May shower; gently cleanse wound with antibacterial soap,  rinse and pat dry prior to dressing wounds Anesthetic (Use 'Patient Medications' Section for Anesthetic Order Entry) Lidocaine applied to wound bed Edema Control - Lymphedema / Segmental Compressive Device / Other Elevate, Exercise Daily and A void Standing for Long Periods of Time. Elevate legs to the level of the heart and pump ankles as often as possible Elevate leg(s) parallel to the floor when sitting. DO YOUR BEST to sleep in the bed at night. DO NOT sleep in your recliner. Long hours of sitting in a recliner leads to swelling of the legs and/or potential wounds on your backside. Wound Treatment Wound #9 - T Second oe Wound Laterality: Right Cleanser: Soap and Water 3 x Per Week/30 Days Discharge Instructions: Gently cleanse wound with antibacterial soap, rinse and pat dry prior to dressing wounds Prim Dressing: Prisma 4.34 (in) 3 x Per Week/30 Days ary Discharge Instructions: Moisten w/normal saline or sterile water; Cover wound as directed. Do not remove from wound bed. Prim Dressing: Xeroform-HBD 2x2 (in/in) 3 x Per Week/30 Days ary Discharge Instructions: Apply Xeroform-HBD 2x2 (in/in) as directed Secondary Dressing: Coverlet Latex-Free Fabric Adhesive Dressings 3 x Per Week/30 Days Discharge Instructions: 1.5 x 2 Electronic Signature(s) Signed: 05/07/2022 5:25:48 PM By: Worthy Keeler PA-C Signed: 05/08/2022 4:20:55 PM By: Carlene Coria RN Entered By: Carlene Coria on 05/07/2022 14:27:01 -------------------------------------------------------------------------------- Problem List Details Patient Name: Date of Service: Kathryn [name], DEA NNA L. 05/07/2022 1:30 PM Medical Record Number: [id number] Patient Account Number: [id number] Date of Birth/Sex: Treating RN: [birth year] (89 y.o. Orvan Falconer Primary Care Provider: [facility] NS-RO Yevonne Pax, [place] Other Clinician: Referring Provider: Treating Provider/Extender: Jeri Cos Self, Referral Weeks in TreatmentKASHE, TREGER  ([id number]) [id number] Page 5 of 9 Active Problems ICD-10 Encounter Code Description Active Date MDM Diagnosis E11.621 Type 2 diabetes mellitus with foot ulcer 05/07/2022 No Yes I73.89 Other specified peripheral vascular diseases 05/07/2022 No Yes L97.512 Non-pressure chronic ulcer of other part of right foot with fat layer exposed 05/07/2022 No Yes M62.81 Muscle weakness (generalized) 05/07/2022 No Yes I50.42 Chronic combined systolic (congestive) and diastolic (congestive) heart failure 05/07/2022 No Yes I10 Essential (primary) hypertension 05/07/2022 No Yes Inactive Problems Resolved Problems  Electronic Signature(s) Signed: 05/07/2022 2:13:12 PM By: Worthy Keeler PA-C Entered By: Worthy Keeler on 05/07/2022 14:13:12 -------------------------------------------------------------------------------- Progress Note Details Patient Name: Date of Service: Kathryn [name], DEA NNA L. 05/07/2022 1:30 PM Medical Record Number: [id number] Patient Account Number: [id number] Date of Birth/Sex: Treating RN: 1933/10/22 (89 y.o. Orvan Falconer Primary Care Provider: [facility] NS-RO Yevonne Pax, [place] Enis Gash Other Clinician: Referring Provider: Treating Provider/Extender: Jeri Cos Self, Referral Weeks in Treatment: 0 Subjective Chief Complaint Information obtained from Patient Right second toe ulcer History of Present Illness (HPI) 02-06-2022 upon evaluation today patient presents for initial inspection here in our clinic concerning wounds that she has over the bilateral feet as well as the right leg. She has been tolerating the dressing changes in general here without complication she has been putting antibiotic ointment on this. With that being said I do believe that she would benefit from the use of something like Xeroform gauze dressings which would likely be a bit better. Fortunately I do not see any signs of infection which is good news do not think she needs any antibiotics. She  tells me that these wounds typically occur as a result of her bumping or hitting her leg and then they slowly progressed from there. Patient does have a history of diabetes for which her hemoglobin A1c was 6.9 measured on 12-12-2021. She also has a history of arterial insufficiency with a right ABI of 0.66 with a TBI of 0.61 and a left ABI of 0.82 with a TBI of 0.45. She has a follow-up evaluation on 02-27-2022 with vascular. This testing was done SHIREE, DUNFORD ([id number]) [phone].pdf Page 6 of 9 on 01-28-2022. Subsequently the patient also has a history of hypertension, generalized weakness, congestive heart failure, and lower extremity edema. 02-13-2022 upon evaluation today patient appears to be doing well currently in regard to her wounds. She is actually making some good progress here which is great news and overall I am extremely pleased with where we stand. I do not see any evidence of worsening. 02-22-2022 upon evaluation today patient's wounds actually are showing some signs of improvement for the most part. Fortunately there does not appear to be any evidence of infection locally nor systemically at this point which is great news. No fevers, chills, nausea, vomiting, or diarrhea. She does have an a vascular appointment on December 5 and we will see what they have to say at that point. 03-08-2022 upon evaluation today patient actually appears to be doing excellent in regards to her wounds. She has been tolerating the dressing changes without complication. Fortunately there is no sign of active infection locally nor systemically at this point which is great news. Readmission: 05-07-2022 upon evaluation today patient presents for reevaluation here in the clinic it has been actually December 14 since I have last seen her. Subsequently she does still have a wound on the right second toe. This is the one area that has not healed everything on the left ended up healing.  Fortunately I do not see any signs of infection at this point and really nothing is changed in her medical history since the last evaluation. She just tells me that this wound is just being stubborn and does not want to heal. Patient History Information obtained from Patient. Allergies oxycodone, prednisone Social History Former smoker, Marital Status - Widowed, Alcohol Use - Never, Caffeine Use - Daily. Medical History Respiratory Patient has history of Chronic Obstructive Pulmonary Disease (COPD) Cardiovascular Patient has history of Congestive Heart  Failure, Coronary Artery Disease, Hypertension, Peripheral Arterial Disease, Peripheral Venous Disease Endocrine Patient has history of Type II Diabetes Musculoskeletal Patient has history of Rheumatoid Arthritis Neurologic Patient has history of Neuropathy Medical A Surgical History Notes nd Oncologic SKkin Cancers Objective Constitutional sitting or standing blood pressure is within target range for patient.. pulse regular and within target range for patient.Marland Kitchen respirations regular, non-labored and within target range for patient.Marland Kitchen temperature within target range for patient.. Well-nourished and well-hydrated in no acute distress. Vitals Time Taken: 1:50 PM, Height: 64 in, Source: Stated, Weight: 180 lbs, Source: Stated, BMI: 30.9, Temperature: 98.1 F, Pulse: 75 bpm, Respiratory Rate: 18 breaths/min, Blood Pressure: 112/63 mmHg. Eyes conjunctiva clear no eyelid edema noted. pupils equal round and reactive to light and accommodation. Ears, Nose, Mouth, and Throat no gross abnormality of ear auricles or external auditory canals. normal hearing noted during conversation. mucus membranes moist. Respiratory normal breathing without difficulty. Cardiovascular 1+ dorsalis pedis/posterior tibialis pulses. no clubbing, cyanosis, significant edema, Musculoskeletal normal gait and posture. no significant deformity or arthritic changes,  no loss or range of motion, no clubbing. Psychiatric this patient is able to make decisions and demonstrates good insight into disease process. Alert and Oriented x 3. pleasant and cooperative. General Notes: Upon inspection patient's wound bed actually showed signs of good granulation and some areas she had some slough and necrotic debris and others. I think this is part of what is keeping her from being able to heal appropriately and I discussed that with her today. I also believe that we are going to perform debridement clearway some of the necrotic debris I did perform debridement down to actually the bone and joint capsule region. With that being said the debridement did not include any removal of the bone at this point. I do believe that she needs to likely have serial debridements to keep this under control and subsequently we will continue to monitor for any signs of infection as well. Obviously right now I do not see obvious infection but I do see some definite issues here with inflammation in the toe being swollen and I do believe that she is going to require good wound care measures in order to get this movement in the right direction. KARSIN, MOLINERO ([id number]) [phone].pdf Page 7 of 9 Integumentary (Hair, Skin) Wound #9 status is Open. Original cause of wound was Gradually Appeared. The date acquired was: 01/24/2022. The wound is located on the Right T Second. oe The wound measures 0.4cm length x 0.4cm width x 0.3cm depth; 0.126cm^2 area and 0.038cm^3 volume. There is Fat Layer (Subcutaneous Tissue) exposed. There is no tunneling or undermining noted. There is a medium amount of serosanguineous drainage noted. There is no granulation within the wound bed. There is a large (67-100%) amount of necrotic tissue within the wound bed including Adherent Slough. Assessment Active Problems ICD-10 Type 2 diabetes mellitus with foot ulcer Other specified peripheral  vascular diseases Non-pressure chronic ulcer of other part of right foot with fat layer exposed Muscle weakness (generalized) Chronic combined systolic (congestive) and diastolic (congestive) heart failure Essential (primary) hypertension Procedures Wound #9 Pre-procedure diagnosis of Wound #9 is a Diabetic Wound/Ulcer of the Lower Extremity located on the Right T Second .Severity of Tissue Pre Debridement oe is: Fat layer exposed. There was a Excisional Skin/Subcutaneous Tissue Debridement with a total area of 0.25 sq cm performed by Tommie Sams., PA-C. With the following instrument(s): Curette to remove Viable and Non-Viable tissue/material. Material removed includes Callus,  Subcutaneous Tissue, and Slough. No specimens were taken. A time out was conducted at 14:20, prior to the start of the procedure. A Minimum amount of bleeding was controlled with Pressure. The procedure was tolerated well with a pain level of 0 throughout and a pain level of 0 following the procedure. Post Debridement Measurements: 0.4cm length x 0.4cm width x 0.3cm depth; 0.038cm^3 volume. Character of Wound/Ulcer Post Debridement is improved. Severity of Tissue Post Debridement is: Fat layer exposed. Post procedure Diagnosis Wound #9: Same as Pre-Procedure Plan Follow-up Appointments: Return Appointment in 1 week. Bathing/ Shower/ Hygiene: May shower; gently cleanse wound with antibacterial soap, rinse and pat dry prior to dressing wounds Anesthetic (Use 'Patient Medications' Section for Anesthetic Order Entry): Lidocaine applied to wound bed Edema Control - Lymphedema / Segmental Compressive Device / Other: Elevate, Exercise Daily and Avoid Standing for Long Periods of Time. Elevate legs to the level of the heart and pump ankles as often as possible Elevate leg(s) parallel to the floor when sitting. DO YOUR BEST to sleep in the bed at night. DO NOT sleep in your recliner. Long hours of sitting in a recliner  leads to swelling of the legs and/or potential wounds on your backside. WOUND #9: - T Second Wound Laterality: Right oe Cleanser: Soap and Water 3 x Per Week/30 Days Discharge Instructions: Gently cleanse wound with antibacterial soap, rinse and pat dry prior to dressing wounds Prim Dressing: Prisma 4.34 (in) 3 x Per Week/30 Days ary Discharge Instructions: Moisten w/normal saline or sterile water; Cover wound as directed. Do not remove from wound bed. Prim Dressing: Xeroform-HBD 2x2 (in/in) 3 x Per Week/30 Days ary Discharge Instructions: Apply Xeroform-HBD 2x2 (in/in) as directed Secondary Dressing: Coverlet Latex-Free Fabric Adhesive Dressings 3 x Per Week/30 Days Discharge Instructions: 1.5 x 2 1. I am going to suggest that we go ahead and initiate a continuation of treatment with silver collagen we will use a little bit of Xeroform over top to keep this from drying out especially with the bone area exposed. 2. Also can recommend that we continue with a Band-Aid to cover. This should probably be changed daily. 3. I am also going to suggest the patient should continue to monitor for any signs of infection or worsening. Obviously if anything changes she knows contact the office and let me know. We will see patient back for reevaluation in 1 week here in the clinic. If anything worsens or changes patient will contact our office for additional recommendations. Electronic Signature(s) Signed: 05/07/2022 2:43:23 PM By: Worthy Keeler PA-C Entered By: Worthy Keeler on 05/07/2022 14:43:22 Thom Chimes ([id number] Page 8 of 9 -------------------------------------------------------------------------------- ROS/PFSH Details Patient Name: Date of Service: Kathryn [name], [place] NNA L. 05/07/2022 1:30 PM Medical Record Number: [id number] Patient Account Number: [id number] Date of Birth/Sex: Treating RN: May 01, 1933 (89 y.o. Orvan Falconer Primary Care Provider:  [facility] NS-RO Yevonne Pax, [place] Enis Gash Other Clinician: Referring Provider: Treating Provider/Extender: Jeri Cos Self, Referral Weeks in Treatment: 0 Information Obtained From Patient Respiratory Medical History: Positive for: Chronic Obstructive Pulmonary Disease (COPD) Cardiovascular Medical History: Positive for: Congestive Heart Failure; Coronary Artery Disease; Hypertension; Peripheral Arterial Disease; Peripheral Venous Disease Endocrine Medical History: Positive for: Type II Diabetes Time with diabetes: 5 Treated with: Oral agents Blood sugar tested every day: No Musculoskeletal Medical History: Positive for: Rheumatoid Arthritis Neurologic Medical History: Positive for: Neuropathy Oncologic Medical History: Past Medical History Notes: SKkin Cancers Immunizations Pneumococcal Vaccine: Received Pneumococcal Vaccination: Yes Received Pneumococcal  Vaccination On or After 60th Birthday: Yes Implantable Devices No devices added Family and Social History Former smoker; Marital Status - Widowed; Alcohol Use: Never; Caffeine Use: Daily Electronic Signature(s) Signed: 05/07/2022 5:25:48 PM By: Worthy Keeler PA-C Signed: 05/08/2022 4:20:55 PM By: Carlene Coria RN Entered By: Carlene Coria on 05/07/2022 13:52:33 Thom Chimes ([id number] Page 9 of 9 -------------------------------------------------------------------------------- SuperBill Details Patient Name: Date of Service: [name] NNA L. 05/07/2022 Medical Record Number: [id number] Patient Account Number: [id number] Date of Birth/Sex: Treating RN: 1934/01/24 (89 y.o. Orvan Falconer Primary Care Provider: [facility] Yevonne Pax, [place] Enis Gash Other Clinician: Referring Provider: Treating Provider/Extender: Jeri Cos Self, Referral Weeks in Treatment: 0 Diagnosis Coding ICD-10 Codes Code Description E11.621 Type 2 diabetes mellitus with foot ulcer I73.89 Other specified  peripheral vascular diseases L97.512 Non-pressure chronic ulcer of other part of right foot with fat layer exposed M62.81 Muscle weakness (generalized) I50.42 Chronic combined systolic (congestive) and diastolic (congestive) heart failure I10 Essential (primary) hypertension Facility Procedures : CPT4 Code: [id number] Description: 99213 - WOUND CARE VISIT-LEV 3 EST PT Modifier: Quantity: 1 : CPT4 Code: [id number] Description: [id number] - DEB SUBQ TISSUE 20 SQ CM/< ICD-10 Diagnosis Description L97.512 Non-pressure chronic ulcer of other part of right foot with fat layer exposed Modifier: Quantity: 1 Physician Procedures : CPT4 Code Description Modifier [id number] - WC PHYS LEVEL 3 - EST PT 25 ICD-10 Diagnosis Description E11.621 Type 2 diabetes mellitus with foot ulcer I73.89 Other specified peripheral vascular diseases L97.512 Non-pressure chronic ulcer of other  part of right foot with fat layer exposed M62.81 Muscle weakness (generalized) Quantity: 1 : [id number] - WC PHYS SUBQ TISS 20 SQ CM ICD-10 Diagnosis Description L97.512 Non-pressure chronic ulcer of other part of right foot with fat layer exposed Quantity: 1 Electronic Signature(s) Signed: 05/07/2022 2:45:04 PM By: Worthy Keeler PA-C Previous Signature: 05/07/2022 2:43:20 PM Version By: Carlene Coria RN Entered By: Worthy Keeler on 05/07/2022 14:45:04

## 2022-05-09 NOTE — Progress Notes (Signed)
ARNETTA, WUBBEN ([id number]) [phone] [id number] Page 1 of 5 Visit Report for 05/07/2022 Abuse Risk Screen Details Patient Name: Date of Service: [name], [place] NNA L. 05/07/2022 1:30 PM Medical Record Number: [id number] Patient Account Number: [id number] Date of Birth/Sex: Treating RN: 1934/01/23 (89 y.o. Orvan Falconer Primary Care Tasia Liz: [facility] [place]onne [place], [place] Enis Gash Other Clinician: Referring Shuntavia Yerby: Treating Darl Brisbin/Extender: Jeri Cos Self, Referral Weeks in Treatment: 0 Abuse Risk Screen Items Answer ABUSE RISK SCREEN: Has anyone close to you tried to hurt or harm you recentlyo No Do you feel uncomfortable with anyone in your familyo No Has anyone forced you do things that you didnt want to doo No Electronic Signature(s) Signed: 05/08/2022 4:20:55 PM By: Carlene Coria RN Entered By: Carlene Coria on 05/07/2022 13:52:40 -------------------------------------------------------------------------------- Activities of Daily Living Details Patient Name: Date of Service: [name] NNA L. 05/07/2022 1:30 PM Medical Record Number: [id number] Patient Account Number: [id number] Date of Birth/Sex: Treating RN: January 02, 1934 (89 y.o. Orvan Falconer Primary Care Earlin Sweeden: [facility] Yevonne Pax, [place] Enis Gash Other Clinician: Referring Lonny Eisen: Treating Tico Crotteau/Extender: Jeri Cos Self, Referral Weeks in Treatment: 0 Activities of Daily Living Items Answer Activities of Daily Living (Please select one for each item) Drive Automobile Not Able T Medications ake Completely Able Use T elephone Completely Able Care for Appearance Completely Able Use T oilet Completely Able Bath / Shower Completely Able Dress Self Completely Able Feed Self Completely Able Walk Completely Able Get In / Out Bed Completely Able Housework Completely MACAYLA, LEEDS ([id number]) [id number] Page 2 of 5 Prepare Meals Completely Able Handle  Money Completely Able Shop for Self Completely Able Electronic Signature(s) Signed: 05/08/2022 4:20:55 PM By: Carlene Coria RN Entered By: Carlene Coria on 05/07/2022 13:53:02 -------------------------------------------------------------------------------- Education Screening Details Patient Name: Date of Service: [name], DEA NNA L. 05/07/2022 1:30 PM Medical Record Number: [id number] Patient Account Number: [id number] Date of Birth/Sex: Treating RN: 1933/10/02 (89 y.o. Orvan Falconer Primary Care Jamieon Lannen: [facility] Yevonne [place], [place] Enis Gash Other Clinician: Referring Casmira Cramer: Treating Lowell Makara/Extender: Jeri Cos Self, Referral Weeks in Treatment: 0 Primary Learner Assessed: Patient Learning Preferences/Education Level/Primary Language Learning Preference: Explanation Highest Education Level: High School Preferred Language: English Cognitive Barrier Language Barrier: No Translator Needed: No Memory Deficit: No Emotional Barrier: No Cultural/Religious Beliefs Affecting Medical Care: No Physical Barrier Impaired Vision: Yes Glasses Impaired Hearing: No Decreased Hand dexterity: No Knowledge/Comprehension Knowledge Level: Medium Comprehension Level: High Ability to understand written instructions: High Ability to understand verbal instructions: High Motivation Anxiety Level: Anxious Cooperation: Cooperative Education Importance: Acknowledges Need Interest in Health Problems: Asks Questions Perception: Coherent Willingness to Engage in Self-Management High Activities: Readiness to Engage in Self-Management High Activities: Electronic Signature(s) Signed: 05/08/2022 4:20:55 PM By: Carlene Coria RN Entered By: Carlene Coria on 05/07/2022 13:53:34 Thom Chimes ([id number]) [id number] Page 3 of 5 -------------------------------------------------------------------------------- Fall Risk Assessment Details Patient Name: Date of Service: [name], [place] NNA L. 05/07/2022 1:30 PM Medical Record Number: [id number] Patient Account Number: [id number] Date of Birth/Sex: Treating RN: [birth year] (89 y.o. Orvan Falconer Primary Care Rendell Thivierge: [facility] Y[place]onne P[place], [place] Enis Gash Other Clinician: Referring Ruberta Holck: Treating Kadia Abaya/Extender: Jeri Cos Self, Referral Weeks in Treatment: 0 Fall Risk Assessment Items Have you had 2 or more falls in the last 12 monthso 0 Yes Have you had any fall that resulted in injury in the last 12 monthso 0 No FALLS RISK SCREEN History of falling - immediate or within 3 months 25 Yes  Secondary diagnosis (Do you have 2 or more medical diagnoseso) 0 No Ambulatory aid None/bed rest/wheelchair/nurse 0 Yes Crutches/cane/walker 0 No Furniture 0 No Intravenous therapy Access/Saline/Heparin Lock 0 No Gait/Transferring Normal/ bed rest/ wheelchair 0 Yes Weak (short steps with or without shuffle, stooped but able to lift head while walking, may seek 0 No support from furniture) Impaired (short steps with shuffle, may have difficulty arising from chair, head down, impaired 0 No balance) Mental Status Oriented to own ability 0 Yes Electronic Signature(s) Signed: 05/08/2022 4:20:55 PM By: Carlene Coria RN Entered By: Carlene Coria on 05/07/2022 13:54:05 -------------------------------------------------------------------------------- Foot Assessment Details Patient Name: Date of Service: [name], DEA NNA L. 05/07/2022 1:30 PM Medical Record Number: [id number] Patient Account Number: [id number] Date of Birth/Sex: Treating RN: Jan 18, 1934 (89 y.o. Orvan Falconer Primary Care Sonakshi Rolland: [facility] NS-[place]onne Pax, [place] Enis Gash Other Clinician: Referring Yazmen Briones: Treating Bisma Klett/Extender: Jeri Cos Self, Referral Weeks in Treatment: 0 Foot Assessment Items Site Locations [place], [place] L ([id number]) [id number] Page 4 of 5 + = Sensation present, - = Sensation absent, C = Callus, U =  Ulcer R = Redness, W = Warmth, M = Maceration, PU = Pre-ulcerative lesion F = Fissure, S = Swelling, D = Dryness Assessment Right: Left: Other Deformity: No No Prior Foot Ulcer: No No Prior Amputation: No No Charcot Joint: No No Ambulatory Status: Ambulatory Without Help Gait: Steady Electronic Signature(s) Signed: 05/08/2022 4:20:55 PM By: Carlene Coria RN Entered By: Carlene Coria on 05/07/2022 13:58:34 -------------------------------------------------------------------------------- Nutrition Risk Screening Details Patient Name: Date of Service: [name], DEA NNA L. 05/07/2022 1:30 PM Medical Record Number: [id number] Patient Account Number: [id number] Date of Birth/Sex: Treating RN: 1933-12-20 (89 y.o. Orvan Falconer Primary Care Tashika Goodin: [facility] Y[place]onne [place], [place] Enis Gash Other Clinician: Referring Kaylei Frink: Treating Olivette Beckmann/Extender: Jeri Cos Self, Referral Weeks in Treatment: 0 Height (in): 64 Weight (lbs): 180 Body Mass Index (BMI): 30.9 Nutrition Risk Screening Items Score Screening NUTRITION RISK SCREEN: I have an illness or condition that made me change the kind and/or amount of food I eat 0 No I eat fewer than two meals per day 0 No I eat few fruits and vegetables, or milk products 0 No I have three or more drinks of beer, liquor or wine almost every day 0 No I have tooth or mouth problems that make it hard for me to eat 0 No I don't always have enough money to buy the food I need 0 No [place], [place] ([id number]) [id number] Page 5 of 5 I eat alone most of the time 0 No I take three or more different prescribed or over-the-counter drugs a day 1 Yes Without wanting to, I have lost or gained 10 pounds in the last six months 0 No I am not always physically able to shop, cook and/or feed myself 0 No Nutrition Protocols Good Risk Protocol 0 No interventions needed Moderate Risk Protocol High Risk Proctocol Risk Level: Good Risk Score:  1 Electronic Signature(s) Signed: 05/08/2022 4:20:55 PM By: Carlene Coria RN Entered By: Carlene Coria on 05/07/2022 13:54:15

## 2022-05-09 NOTE — Progress Notes (Signed)
TRESSY, [name] ([id number]) [id number] Page 1 of 9 Visit Report for 05/07/2022 Allergy List Details Patient Name: Date of Service: Kathryn [name], [place] NNA L. 05/07/2022 1:30 PM Medical Record Number: [id number] Patient Account Number: [id number] Date of Birth/Sex: Treating RN: 11-11-1933 (89 y.o. Orvan Falconer Primary Care Cristan Hout: [facility] NS-[facility] Y[address]onne [address]ax, [place] Enis Gash Other Clinician: Referring Caris Cerveny: Treating Haris Baack/Extender: Jeri Cos Self, Referral Weeks in Treatment: 0 Allergies Active Allergies oxycodone prednisone Allergy Notes Electronic Signature(s) Signed: 05/08/2022 4:20:55 PM By: Carlene Coria RN Entered By: Carlene Coria on 05/07/2022 13:51:58 -------------------------------------------------------------------------------- Arrival Information Details Patient Name: Date of Service: Kathryn [name], DEA NNA L. 05/07/2022 1:30 PM Medical Record Number: [id number] Patient Account Number: [id number] Date of Birth/Sex: Treating RN: 29-Mar-1933 (89 y.o. Orvan Falconer Primary Care Joniece Smotherman: [facility] NS-[facility] [address], [place] Enis Gash Other Clinician: Referring Kingjames Coury: Treating Zyron Deeley/Extender: Jeri Cos Self, Referral Weeks in Treatment: 0 Visit Information Patient Arrived: Wheel Chair Arrival Time: 13:47 Accompanied By: son Transfer Assistance: None Patient Identification Verified: Yes Secondary Verification Process Completed: Yes Patient Requires Transmission-Based Precautions: No Patient Has Alerts: Yes Patient Alerts: ABI L .82 02/06/22 ABI R .66 02/06/22 Goleman, Betina L ([id number]) History Since Last Visit Added or deleted any medications: No Any new allergies or adverse reactions: No Had a fall or experienced change in activities of daily living that may affect risk of falls: No Signs or symptoms of abuse/neglect since last visito No Hospitalized since last visit: No Implantable device outside of the clinic excluding cellular tissue based products placed  in the center since last visit: No Pain Present Now: No [id number] Page 2 of 9 Electronic Signature(s) Signed: 05/08/2022 4:20:55 PM By: Carlene Coria RN Entered By: Carlene Coria on 05/07/2022 13:50:11 -------------------------------------------------------------------------------- Clinic Level of Care Assessment Details Patient Name: Date of Service: Kathryn [name] All NNA L. 05/07/2022 1:30 PM Medical Record Number: [id number] Patient Account Number: [id number] Date of Birth/Sex: Treating RN: 1933/10/21 (89 y.o. Orvan Falconer Primary Care Advith Martine: [facility] [address], [place] Enis Gash Other Clinician: Referring Louiza Moor: Treating Oliveah Zwack/Extender: Jeri Cos Self, Referral Weeks in Treatment: 0 Clinic Level of Care Assessment Items TOOL 1 Quantity Score X- 1 0 Use when EandM and Procedure is performed on INITIAL visit ASSESSMENTS - Nursing Assessment / Reassessment X- 1 20 General Physical Exam (combine w/ comprehensive assessment (listed just below) when performed on new pt. evals) X- 1 25 Comprehensive Assessment (HX, ROS, Risk Assessments, Wounds Hx, etc.) ASSESSMENTS - Wound and Skin Assessment / Reassessment []$  - 0 Dermatologic / Skin Assessment (not related to wound area) ASSESSMENTS - Ostomy and/or Continence Assessment and Care []$  - 0 Incontinence Assessment and Management []$  - 0 Ostomy Care Assessment and Management (repouching, etc.) PROCESS - Coordination of Care X - Simple Patient / Family Education for ongoing care 1 15 []$  - 0 Complex (extensive) Patient / Family Education for ongoing care X- 1 10 Staff obtains Programmer, systems, Records, T Results / Process Orders est []$  - 0 Staff telephones HHA, Nursing Homes / Clarify orders / etc []$  - 0 Routine Transfer to another Facility (non-emergent condition) []$  - 0 Routine Hospital Admission (non-emergent condition) X- 1 15 New Admissions / Biomedical engineer / Ordering NPWT Apligraf, etc. , []$  -  0 Emergency Hospital Admission (emergent condition) PROCESS - Special Needs []$  - 0 Pediatric / Minor Patient Management []$  - 0 Isolation Patient Management []$  - 0 Hearing / Language / Visual special needs []$  - 0 Assessment of Community assistance (transportation, D/C planning, etc.) []$  -  0 Additional assistance / Altered mentation []$  - 0 Support Surface(s) Assessment (bed, cushion, seat, etc.) INTERVENTIONS - Miscellaneous []$  - 0 External ear exam []$  - 0 Patient Transfer (multiple staff / Stormy Fabian / Similar devices) THEORA, DEPRIMO L ([id number]) [id number] Page 3 of 9 []$  - 0 Simple Staple / Suture removal (25 or less) []$  - 0 Complex Staple / Suture removal (26 or more) []$  - 0 Hypo/Hyperglycemic Management (do not check if billed separately) []$  - 0 Ankle / Brachial Index (ABI) - do not check if billed separately Has the patient been seen at the hospital within the last three years: Yes Total Score: 85 Level Of Care: New/Established - Level 3 Electronic Signature(s) Signed: 05/08/2022 4:20:55 PM By: Carlene Coria RN Entered By: Carlene Coria on 05/07/2022 14:27:18 -------------------------------------------------------------------------------- Encounter Discharge Information Details Patient Name: Date of Service: Kathryn [name], DEA NNA L. 05/07/2022 1:30 PM Medical Record Number: [id number] Patient Account Number: [id number] Date of Birth/Sex: Treating RN: [birth year] (89 y.o. Orvan Falconer Primary Care Temiloluwa Recchia: [facility] [address], [place] Enis Gash Other Clinician: Referring Esmae Donathan: Treating Alasdair Kleve/Extender: Jeri Cos Self, Referral Weeks in Treatment: 0 Encounter Discharge Information Items Post Procedure Vitals Discharge Condition: Stable Temperature (F): 98.1 Ambulatory Status: Wheelchair Pulse (bpm): 75 Discharge Destination: Home Respiratory Rate (breaths/min): 18 Transportation: Private Auto Blood Pressure (mmHg): 112/63 Accompanied By:  son Schedule Follow-up Appointment: Yes Clinical Summary of Care: Electronic Signature(s) Signed: 05/08/2022 4:20:55 PM By: Carlene Coria RN Entered By: Carlene Coria on 05/07/2022 14:28:06 -------------------------------------------------------------------------------- Lower Extremity Assessment Details Patient Name: Date of Service: Kathryn [name], DEA NNA L. 05/07/2022 1:30 PM Medical Record Number: [id number] Patient Account Number: [id number] Date of Birth/Sex: Treating RN: February 28, 1934 (89 y.o. Orvan Falconer Primary Care Laxmi Choung: [facility] [address] [place], [place] Enis Gash Other Clinician: Referring Radames Mejorado: Treating Elicia Lui/Extender: Jeri Cos Self, Referral Weeks in Treatment: 0 Edema Assessment Left: [Left: Right] [Right: :] Assessed: [Left: No] [Right: No] Edema: [Left: Ye] [Right: s] Calf Left: Right: Point of Measurement: 32 cm From Medial Instep 34 cm Ankle Left: Right: Point of Measurement: 10 cm From Medial Instep 24 cm Knee To Floor Left: Right: From Medial Instep 42 cm Vascular Assessment Pulses: Dorsalis Pedis Palpable: [Right:Yes] Electronic Signature(s) Signed: 05/08/2022 4:20:55 PM By: Carlene Coria RN Entered By: Carlene Coria on 05/07/2022 14:01:48 -------------------------------------------------------------------------------- Multi Wound Chart Details Patient Name: Date of Service: Kathryn [name], DEA NNA L. 05/07/2022 1:30 PM Medical Record Number: [id number] Patient Account Number: [id number] Date of Birth/Sex: Treating RN: 10/05/1933 (89 y.o. Orvan Falconer Primary Care Mitali Shenefield: [facility] [address] [place], [place] Enis Gash Other Clinician: Referring Tyrihanna Wingert: Treating Kenitha Glendinning/Extender: Jeri Cos Self, Referral Weeks in Treatment: 0 Vital Signs Height(in): 64 Pulse(bpm): 75 Weight(lbs): 180 Blood Pressure(mmHg): 112/63 Body Mass Index(BMI): 30.9 Temperature(F): 98.1 Respiratory Rate(breaths/min): 18 [9:Photos:] [N/A:N/A] Right T Second oe N/A N/A Wound  Location: Gradually Appeared N/A N/A Wounding Event: Diabetic Wound/Ulcer of the Lower N/A N/A Primary Etiology: Extremity Chronic Obstructive Pulmonary N/A N/A Comorbid History: Disease (COPD), Congestive Heart Fees, Nichol L ([id number] Page 5 of 9 Failure, Coronary Artery Disease, Hypertension, Peripheral Arterial Disease, Peripheral Venous Disease, Type II Diabetes, Rheumatoid Arthritis, Neuropathy 01/24/2022 N/A N/A Date Acquired: 0 N/A N/A Weeks of Treatment: Open N/A N/A Wound Status: No N/A N/A Wound Recurrence: 0.4x0.4x0.3 N/A N/A Measurements L x W x D (cm) 0.126 N/A N/A A (cm) : rea 0.038 N/A N/A Volume (cm) : Grade 1 N/A N/A Classification: Medium N/A N/A Exudate A mount: Serosanguineous N/A N/A Exudate Type: red,  brown N/A N/A Exudate Color: None Present (0%) N/A N/A Granulation A mount: Large (67-100%) N/A N/A Necrotic A mount: Fat Layer (Subcutaneous Tissue): Yes N/A N/A Exposed Structures: Fascia: No Tendon: No Muscle: No Joint: No Bone: No None N/A N/A Epithelialization: Treatment Notes Electronic Signature(s) Signed: 05/08/2022 4:20:55 PM By: Carlene Coria RN Entered By: Carlene Coria on 05/07/2022 14:02:00 -------------------------------------------------------------------------------- Multi-Disciplinary Care Plan Details Patient Name: Date of Service: Kathryn [name], DEA NNA L. 05/07/2022 1:30 PM Medical Record Number: [id number] Patient Account Number: [id number] Date of Birth/Sex: Treating RN: 10/20/1933 (89 y.o. Orvan Falconer Primary Care Crandall Harvel: [facility] NS-[facility] [address], [place] Enis Gash Other Clinician: Referring Ricardo Schubach: Treating Thunder Bridgewater/Extender: Jeri Cos Self, Referral Weeks in Treatment: 0 Active Inactive Necrotic Tissue Nursing Diagnoses: Knowledge deficit related to management of necrotic/devitalized tissue Goals: Necrotic/devitalized tissue will be minimized in the wound bed Date  Initiated: 05/07/2022 Target Resolution Date: 06/05/2022 Goal Status: Active Interventions: Assess patient pain level pre-, during and post procedure and prior to discharge Provide education on necrotic tissue and debridement process Notes: Wound/Skin Impairment Nursing DiagnosesEMANUEL, ZOCH ([id number] Page 6 of 9 Knowledge deficit related to ulceration/compromised skin integrity Goals: Patient/caregiver will verbalize understanding of skin care regimen Date Initiated: 05/07/2022 Target Resolution Date: 06/05/2022 Goal Status: Active Ulcer/skin breakdown will have a volume reduction of 30% by week 4 Date Initiated: 05/07/2022 Target Resolution Date: 06/05/2022 Goal Status: Active Ulcer/skin breakdown will have a volume reduction of 50% by week 8 Date Initiated: 05/07/2022 Target Resolution Date: 07/06/2022 Goal Status: Active Ulcer/skin breakdown will have a volume reduction of 80% by week 12 Date Initiated: 05/07/2022 Target Resolution Date: 08/05/2022 Goal Status: Active Ulcer/skin breakdown will heal within 14 weeks Date Initiated: 05/07/2022 Target Resolution Date: 09/05/2022 Goal Status: Active Interventions: Assess patient/caregiver ability to obtain necessary supplies Assess patient/caregiver ability to perform ulcer/skin care regimen upon admission and as needed Assess ulceration(s) every visit Notes: Electronic Signature(s) Signed: 05/08/2022 4:20:55 PM By: Carlene Coria RN Entered By: Carlene Coria on 05/07/2022 14:04:10 -------------------------------------------------------------------------------- Pain Assessment Details Patient Name: Date of Service: Kathryn [name], DEA NNA L. 05/07/2022 1:30 PM Medical Record Number: [id number] Patient Account Number: [id number] Date of Birth/Sex: Treating RN: 1933-11-02 (89 y.o. Orvan Falconer Primary Care Lanijah Warzecha: [facility] [address] [place], [place] Enis Gash Other Clinician: Referring Lindora Alviar: Treating  Kin Galbraith/Extender: Jeri Cos Self, Referral Weeks in Treatment: 0 Active Problems Location of Pain Severity and Description of Pain Patient Has Paino Yes Site Locations With Dressing Change: Yes Duration of the Pain. Constant / Intermittento Intermittent How Long Does it Lasto Hours: Minutes: 15 Rate the pain. Current Pain Level: 3 Worst Pain Level: 5 Least Pain Level: 0 Tolerable Pain Level: 5 Benincasa, Talin L ([id number] Page 7 of 9 Character of Pain Describe the Pain: Burning Pain Management and Medication Current Pain Management: Medication: Yes Cold Application: No Rest: Yes Massage: No Activity: No T.E.N.S.: No Heat Application: No Leg drop or elevation: No Is the Current Pain Management Adequate: Inadequate How does your wound impact your activities of daily livingo Sleep: No Bathing: No Appetite: No Relationship With Others: No Bladder Continence: No Emotions: No Bowel Continence: No Work: No Toileting: No Drive: No Dressing: No Hobbies: No Electronic Signature(s) Signed: 05/08/2022 4:20:55 PM By: Carlene Coria RN Entered By: Carlene Coria on 05/07/2022 13:50:52 -------------------------------------------------------------------------------- Patient/Caregiver Education Details Patient Name: Date of Service: Kathryn [name], DEA NNA Carlean Jews 2/12/2024andnbsp1:30 PM Medical Record Number: [id number] Patient Account Number: [id number] Date of Birth/Gender: Treating RN: 02/29/1934 (89 y.o. F) Epps,  Morey Hummingbird Primary Care Physician: [facility] NS-[facility] [address]onne [place], [place] Enis Gash Other Clinician: Referring Physician: Treating Physician/Extender: Jeri Cos Self, Referral Weeks in Treatment: 0 Education Assessment Education Provided To: Patient Education Topics Provided Welcome T [facility] Packet: o Methods: Explain/Verbal Responses: State content correctly Electronic Signature(s) Signed: 05/08/2022 4:20:55 PM By: Carlene Coria RN Entered By: Carlene Coria on 05/07/2022 14:02:22 Thom Chimes ([id number] Page 8 of 9 -------------------------------------------------------------------------------- Wound Assessment Details Patient Name: Date of Service: Kathryn [name], [place] NNA L. 05/07/2022 1:30 PM Medical Record Number: [id number] Patient Account Number: [id number] Date of Birth/Sex: Treating RN: 26-Apr-1933 (89 y.o. Orvan Falconer Primary Care Rumor Sun: [facility] NS-[place] [address], [place] Enis Gash Other Clinician: Referring Chalee Hirota: Treating Marlyss Cissell/Extender: Jeri Cos Self, Referral Weeks in Treatment: 0 Wound Status Wound Number: 9 Primary Diabetic Wound/Ulcer of the Lower Extremity Etiology: Wound Location: Right T Second oe Wound Open Wounding Event: Gradually Appeared Status: Date Acquired: 01/24/2022 Comorbid Chronic Obstructive Pulmonary Disease (COPD), Congestive Heart Weeks Of Treatment: 0 History: Failure, Coronary Artery Disease, Hypertension, Peripheral Arterial Clustered Wound: No Disease, Peripheral Venous Disease, Type II Diabetes, Rheumatoid Arthritis, Neuropathy Photos Wound Measurements Length: (cm) 0.4 Width: (cm) 0.4 Depth: (cm) 0.3 Area: (cm) 0.126 Volume: (cm) 0.038 % Reduction in Area: % Reduction in Volume: Epithelialization: None Tunneling: No Undermining: No Wound Description Classification: Grade 1 Exudate Amount: Medium Exudate Type: Serosanguineous Exudate Color: red, brown Foul Odor After Cleansing: No Slough/Fibrino Yes Wound Bed Granulation Amount: None Present (0%) Exposed Structure Necrotic Amount: Large (67-100%) Fascia Exposed: No Necrotic Quality: Adherent Slough Fat Layer (Subcutaneous Tissue) Exposed: Yes Tendon Exposed: No Muscle Exposed: No Joint Exposed: No Bone Exposed: No Treatment Notes Wound #9 (Toe Second) Wound Laterality: Right Cleanser Soap and Water Discharge Instruction: Gently cleanse wound with  antibacterial soap, rinse and pat dry prior to dressing wounds Coin, Wilfred L ([id number] Page 9 of 9 Peri-Wound Care Topical Primary Dressing Prisma 4.34 (in) Discharge Instruction: Moisten w/normal saline or sterile water; Cover wound as directed. Do not remove from wound bed. Xeroform-HBD 2x2 (in/in) Discharge Instruction: Apply Xeroform-HBD 2x2 (in/in) as directed Secondary Dressing Coverlet Latex-Free Fabric Adhesive Dressings Discharge Instruction: 1.5 x 2 Secured With Compression Wrap Compression Stockings Add-Ons Electronic Signature(s) Signed: 05/08/2022 4:20:55 PM By: Carlene Coria RN Entered By: Carlene Coria on 05/07/2022 14:00:02 -------------------------------------------------------------------------------- Vitals Details Patient Name: Date of Service: Kathryn [name], DEA NNA L. 05/07/2022 1:30 PM Medical Record Number: [id number] Patient Account Number: [id number] Date of Birth/Sex: Treating RN: Aug 12, 1933 (89 y.o. Orvan Falconer Primary Care Maanasa Aderhold: [facility] NS-RO [address], [place] Enis Gash Other Clinician: Referring Henry Demeritt: Treating Isabeau Mccalla/Extender: Jeri Cos Self, Referral Weeks in Treatment: 0 Vital Signs Time Taken: 13:50 Temperature (F): 98.1 Height (in): 64 Pulse (bpm): 75 Source: Stated Respiratory Rate (breaths/min): 18 Weight (lbs): 180 Blood Pressure (mmHg): 112/63 Source: Stated Reference Range: 80 - 120 mg / dl Body Mass Index (BMI): 30.9 Electronic Signature(s) Signed: 05/08/2022 4:20:55 PM By: Carlene Coria RN Entered By: Carlene Coria on 05/07/2022 13:51:18

## 2022-05-14 ENCOUNTER — Encounter: Payer: Medicare HMO | Admitting: Physician Assistant

## 2022-05-14 ENCOUNTER — Ambulatory Visit
Admission: RE | Admit: 2022-05-14 | Discharge: 2022-05-14 | Disposition: A | Discharge status: Home / Self Care | Payer: Medicare HMO | Source: Ambulatory Visit | Attending: Physician Assistant | Admitting: Physician Assistant

## 2022-05-14 ENCOUNTER — Other Ambulatory Visit: Payer: Self-pay | Admitting: Physician Assistant

## 2022-05-14 ENCOUNTER — Other Ambulatory Visit
Admission: RE | Admit: 2022-05-14 | Discharge: 2022-05-14 | Disposition: A | Discharge status: Home / Self Care | Payer: Medicare HMO | Source: Home / Self Care | Attending: Physician Assistant | Admitting: Physician Assistant

## 2022-05-14 ENCOUNTER — Ambulatory Visit
Admission: RE | Admit: 2022-05-14 | Discharge: 2022-05-14 | Disposition: A | Discharge status: Home / Self Care | Payer: Medicare HMO | Attending: Physician Assistant | Admitting: Physician Assistant

## 2022-05-14 DIAGNOSIS — E11621 Type 2 diabetes mellitus with foot ulcer: Secondary | ICD-10-CM | POA: Diagnosis not present

## 2022-05-14 DIAGNOSIS — S91104A Unspecified open wound of right lesser toe(s) without damage to nail, initial encounter: Secondary | ICD-10-CM | POA: Diagnosis not present

## 2022-05-14 NOTE — Progress Notes (Addendum)
Kathryn [name], Kathryn [name] ([id number]) [id number] Page 1 of 8 Visit Report for 05/14/2022 Chief Complaint Document Details Patient Name: Date of Service: Kathryn [name], Kathryn [name] 05/14/2022 8:45 A M Medical Record Number: [id number] Patient Account Number: [id number] Date of Birth/Sex: Treating RN: [birth year] (88 y.o. Kathryn [name] Primary Care Provider: Eulis [name] Other Clinician: Referring Provider: Treating Provider/Extender: Kathryn [name] in Treatment: 1 Information Obtained from: Patient Chief Complaint Right second toe ulcer Electronic Signature(s) Signed: 05/14/2022 8:56:42 AM By: Worthy Keeler PA-C Entered By: Worthy Keeler on 05/14/2022 08:56:42 -------------------------------------------------------------------------------- Debridement Details Patient Name: Date of Service: Kathryn [name], [name] 05/14/2022 8:45 A M Medical Record Number: [id number] Patient Account Number: [id number] Date of Birth/Sex: Treating RN: January 14, 1934 (89 y.o. Kathryn [name] Primary Care Provider: Eulis [name] Other Clinician: Referring Provider: Treating Provider/Extender: Kathryn [name] in Treatment: 1 Debridement Performed for Assessment: Wound #9 Right T Second oe Performed By: Physician Kathryn [name], PA-C Debridement Type: Debridement Severity of Tissue Pre Debridement: Fat layer exposed Level of Consciousness (Pre-procedure): Awake and Alert Pre-procedure Verification/Time Out Yes - 09:15 Taken: T Area Debrided ([name] x W): otal 0.6 (cm) x 0.9 (cm) = 0.54 (cm) Tissue and other material debrided: Viable, Non-Viable, Slough, Subcutaneous, Biofilm, Slough Level: Skin/Subcutaneous Tissue Debridement Description: Excisional Instrument: Curette Specimen: Swab, Number of Specimens T aken: 1 Bleeding: Minimum Hemostasis Achieved: Pressure Response to Treatment: Procedure was tolerated  well Level of Consciousness (Post- Awake and Alert procedure): Kathryn [name], Kathryn [name] ([id number]) [id number] Page 2 of 8 Post Debridement Measurements of Total Wound Length: (cm) 0.6 Width: (cm) 0.9 Depth: (cm) 0.3 Volume: (cm) 0.127 Character of Wound/Ulcer Post Debridement: Stable Severity of Tissue Post Debridement: Fat layer exposed Post Procedure Diagnosis Same as Pre-procedure Electronic Signature(s) Signed: 05/14/2022 1:35:06 PM By: Gretta Cool, BSN, RN, CWS, Kim RN, BSN Signed: 05/14/2022 1:48:29 PM By: Worthy Keeler PA-C Entered By: Gretta Cool, BSN, RN, CWS, Kim on 05/14/2022 09:17:50 -------------------------------------------------------------------------------- HPI Details Patient Name: Date of Service: Kathryn [name], [name] 05/14/2022 8:45 A M Medical Record Number: [id number] Patient Account Number: [id number] Date of Birth/Sex: Treating RN: 1933-11-24 (89 y.o. Kathryn [name] Primary Care Provider: Eulis [name] Other Clinician: Referring Provider: Treating Provider/Extender: Kathryn [name] in Treatment: 1 History of Present Illness HPI Description: 02-06-2022 upon evaluation today patient presents for initial inspection here in our clinic concerning wounds that she has over the bilateral feet as well as the right leg. She has been tolerating the dressing changes in general here without complication she has been putting antibiotic ointment on this. With that being said I do believe that she would benefit from the use of something like Xeroform gauze dressings which would likely be a bit better. Fortunately I do not see any signs of infection which is good news do not think she needs any antibiotics. She tells me that these wounds typically occur as a result of her bumping or hitting her leg and then they slowly progressed from there. Patient does have a history of diabetes for which her hemoglobin A1c was 6.9 measured on  12-12-2021. She also has a history of arterial insufficiency with a right ABI of 0.66 with a TBI of 0.61 and a left ABI of 0.82 with a TBI of 0.45. She has a follow-up evaluation on 02-27-2022 with vascular. This testing was done on 01-28-2022. Subsequently the patient also has a history of hypertension, generalized weakness, congestive heart failure, and lower extremity edema.  02-13-2022 upon evaluation today patient appears to be doing well currently in regard to her wounds. She is actually making some good progress here which is great news and overall I am extremely pleased with where we stand. I do not see any evidence of worsening. 02-22-2022 upon evaluation today patient's wounds actually are showing some signs of improvement for the most part. Fortunately there does not appear to be any evidence of infection locally nor systemically at this point which is great news. No fevers, chills, nausea, vomiting, or diarrhea. She does have an a vascular appointment on December 5 and we will see what they have to say at that point. 03-08-2022 upon evaluation today patient actually appears to be doing excellent in regards to her wounds. She has been tolerating the dressing changes without complication. Fortunately there is no sign of active infection locally nor systemically at this point which is great news. Readmission: 05-07-2022 upon evaluation today patient presents for reevaluation here in the clinic it has been actually December 14 since I have last seen her. Subsequently she does still have a wound on the right second toe. This is the one area that has not healed everything on the left ended up healing. Fortunately I do not see any signs of infection at this point and really nothing is changed in her medical history since the last evaluation. She just tells me that this wound is just being stubborn and does not want to heal. 05-14-2022 upon evaluation today patient appears to be doing poorly in regard  to her toe ulcer. Unfortunately she is showing signs of this being a little worse than last week. I was hopeful that this would actually show some signs of improvement but were definitely not seeing that. I think we probably need to switch to a different dressing, recommend Iodosorb/Iodoflex as an option here for her. Electronic Signature(s) Signed: 05/14/2022 9:20:56 AM By: Worthy Keeler PA-C Entered By: Worthy Keeler on 05/14/2022 09:20:56 Kathryn [name] ([id number]) [id number] Page 3 of 8 -------------------------------------------------------------------------------- Physical Exam Details Patient Name: Date of Service: Kathryn [name] 05/14/2022 8:45 A M Medical Record Number: [id number] Patient Account Number: [id number] Date of Birth/Sex: Treating RN: 1933/12/19 (89 y.o. Kathryn [name] Primary Care Provider: Eulis [name] Other Clinician: Referring Provider: Treating Provider/Extender: Jeri Cos [name], Makiera [name] in Treatment: 1 Constitutional Well-nourished and well-hydrated in no acute distress. Respiratory normal breathing without difficulty. Psychiatric this patient is able to make decisions and demonstrates good insight into disease process. Alert and Oriented x 3. pleasant and cooperative. Notes Upon inspection patient's wound bed actually showed signs of poor granulation tissue. Subsequently I do believe that she is going to require sharp debridement to clearway some of the necrotic debris after I remove this I did go ahead and initiate a continuation of treatment with the obtaining of a wound culture and subsequently I also am going to switch the dressing again to Iodoflex which I think will be a better option here currently. She is not allergic to Betadine she tells me. Electronic Signature(s) Signed: 05/14/2022 9:21:47 AM By: Worthy Keeler PA-C Entered By: Worthy Keeler on 05/14/2022  09:21:47 -------------------------------------------------------------------------------- Physician Orders Details Patient Name: Date of Service: Kathryn [name], [name] 05/14/2022 8:45 A M Medical Record Number: [id number] Patient Account Number: [id number] Date of Birth/Sex: Treating RN: 03-21-1934 (89 y.o. Kathryn [name] Primary Care Provider: Eulis [name] Other Clinician: Referring Provider: Treating Provider/Extender: Jeri Cos [name], Makiera [name] in Treatment: 1  Verbal / Phone Orders: No Diagnosis Coding ICD-10 Coding Code Description E11.621 Type 2 diabetes mellitus with foot ulcer I73.89 Other specified peripheral vascular diseases L97.512 Non-pressure chronic ulcer of other part of right foot with fat layer exposed M62.81 Muscle weakness (generalized) I50.42 Chronic combined systolic (congestive) and diastolic (congestive) heart failure Kathryn [name], Kathryn [name] ([id number]) [id number] Page 4 of 8 I10 Essential (primary) hypertension Follow-up Appointments Return Appointment in 1 week. Bathing/ Shower/ Hygiene May shower; gently cleanse wound with antibacterial soap, rinse and pat dry prior to dressing wounds Anesthetic (Use 'Patient Medications' Section for Anesthetic Order Entry) Lidocaine applied to wound bed Edema Control - Lymphedema / Segmental Compressive Device / Other Elevate, Exercise Daily and A void Standing for Long Periods of Time. Elevate legs to the level of the heart and pump ankles as often as possible Elevate leg(s) parallel to the floor when sitting. DO YOUR BEST to sleep in the bed at night. DO NOT sleep in your recliner. Long hours of sitting in a recliner leads to swelling of the legs and/or potential wounds on your backside. Wound Treatment Wound #9 - T Second oe Wound Laterality: Right Cleanser: Soap and Water 3 x Per Week/30 Days Discharge Instructions: Gently cleanse wound with antibacterial soap, rinse  and pat dry prior to dressing wounds Prim Dressing: IODOFLEX 0.9% Cadexomer Iodine Pad 3 x Per Week/30 Days ary Discharge Instructions: Apply Iodoflex to wound bed only as directed. Secondary Dressing: Coverlet Latex-Free Fabric Adhesive Dressings 3 x Per Week/30 Days Discharge Instructions: 1.5 x 2 Laboratory Bacteria identified in Wound by Culture (MICRO) - R 2nd toe LOINC Code: [id number] Convenience Name: Wound culture routine Radiology X-ray, foot - 3-view Right foot. Non-healing open wound on 2nd right toe Electronic Signature(s) Signed: 05/14/2022 1:35:06 PM By: Gretta Cool, BSN, RN, CWS, Kim RN, BSN Signed: 05/14/2022 1:48:29 PM By: Worthy Keeler PA-C Entered By: Gretta Cool, BSN, RN, CWS, Kim on 05/14/2022 09:19:54 Prescription 05/14/2022 -------------------------------------------------------------------------------- Kathryn [name] Jeri Cos PA-C Patient Name: Provider: [birth year] [id number] Date of Birth: NPI#: [id number] Sex: DEA #: [phone] Phone #: License #: [place] Patient Address: [facility] [place], [place] [address] [address] [place], [place] [address] [phone] Allergies oxycodone; prednisone ANAVAE, RACY ([id number]) [id number] Page 5 of 8 Provider's Orders X-ray, foot - 3-view Right foot. Non-healing open wound on 2nd right toe Hand Signature: Date(s): Electronic Signature(s) Signed: 05/14/2022 1:35:06 PM By: Gretta Cool, BSN, RN, CWS, Kim RN, BSN Signed: 05/14/2022 1:48:29 PM By: Worthy Keeler PA-C Entered By: Gretta Cool, BSN, RN, CWS, Kim on 05/14/2022 09:19:54 -------------------------------------------------------------------------------- Problem List Details Patient Name: Date of Service: Kathryn [name], [name] 05/14/2022 8:45 A M Medical Record Number: [id number] Patient Account Number: [id number] Date of Birth/Sex: Treating RN: 03-01-1934  (89 y.o. Kathryn [name] Primary Care Provider: Eulis [name] Other Clinician: Referring Provider: Treating Provider/Extender: Verne Carrow, Makiera [name] in Treatment: 1 Active Problems ICD-10 Encounter Code Description Active Date MDM Diagnosis E11.621 Type 2 diabetes mellitus with foot ulcer 05/07/2022 No Yes I73.89 Other specified peripheral vascular diseases 05/07/2022 No Yes L97.512 Non-pressure chronic ulcer of other part of right foot with fat layer exposed 05/07/2022 No Yes M62.81 Muscle weakness (generalized) 05/07/2022 No Yes I50.42 Chronic combined systolic (congestive) and diastolic (congestive) heart failure 05/07/2022 No Yes I10 Essential (primary) hypertension 05/07/2022 No Yes Inactive Problems Resolved Problems Electronic Signature(s) Signed: 05/14/2022 8:56:34 AM By: Worthy Keeler PA-C Entered By: Melburn Hake,  Kanya Potteiger on 05/14/2022 08:56:34 Kathryn [name], Kathryn [name] ([id number]) [id number] Page 6 of 8 -------------------------------------------------------------------------------- Progress Note Details Patient Name: Date of Service: Kathryn [name], Kathryn [name] 05/14/2022 8:45 A M Medical Record Number: [id number] Patient Account Number: [id number] Date of Birth/Sex: Treating RN: [birth year] (89 y.o. Kathryn [name] Primary Care Provider: Eulis [name] Other Clinician: Referring Provider: Treating Provider/Extender: Kathryn [name] in Treatment: 1 Subjective Chief Complaint Information obtained from Patient Right second toe ulcer History of Present Illness (HPI) 02-06-2022 upon evaluation today patient presents for initial inspection here in our clinic concerning wounds that she has over the bilateral feet as well as the right leg. She has been tolerating the dressing changes in general here without complication she has been putting antibiotic ointment on this. With that being said I do believe  that she would benefit from the use of something like Xeroform gauze dressings which would likely be a bit better. Fortunately I do not see any signs of infection which is good news do not think she needs any antibiotics. She tells me that these wounds typically occur as a result of her bumping or hitting her leg and then they slowly progressed from there. Patient does have a history of diabetes for which her hemoglobin A1c was 6.9 measured on 12-12-2021. She also has a history of arterial insufficiency with a right ABI of 0.66 with a TBI of 0.61 and a left ABI of 0.82 with a TBI of 0.45. She has a follow-up evaluation on 02-27-2022 with vascular. This testing was done on 01-28-2022. Subsequently the patient also has a history of hypertension, generalized weakness, congestive heart failure, and lower extremity edema. 02-13-2022 upon evaluation today patient appears to be doing well currently in regard to her wounds. She is actually making some good progress here which is great news and overall I am extremely pleased with where we stand. I do not see any evidence of worsening. 02-22-2022 upon evaluation today patient's wounds actually are showing some signs of improvement for the most part. Fortunately there does not appear to be any evidence of infection locally nor systemically at this point which is great news. No fevers, chills, nausea, vomiting, or diarrhea. She does have an a vascular appointment on December 5 and we will see what they have to say at that point. 03-08-2022 upon evaluation today patient actually appears to be doing excellent in regards to her wounds. She has been tolerating the dressing changes without complication. Fortunately there is no sign of active infection locally nor systemically at this point which is great news. Readmission: 05-07-2022 upon evaluation today patient presents for reevaluation here in the clinic it has been actually December 14 since I have last seen her.  Subsequently she does still have a wound on the right second toe. This is the one area that has not healed everything on the left ended up healing. Fortunately I do not see any signs of infection at this point and really nothing is changed in her medical history since the last evaluation. She just tells me that this wound is just being stubborn and does not want to heal. 05-14-2022 upon evaluation today patient appears to be doing poorly in regard to her toe ulcer. Unfortunately she is showing signs of this being a little worse than last week. I was hopeful that this would actually show some signs of improvement but were definitely not seeing that. I think we probably need to switch to a different dressing, recommend Iodosorb/Iodoflex as  an option here for her. Objective Constitutional Well-nourished and well-hydrated in no acute distress. Vitals Time Taken: 8:50 AM, Height: 64 in, Weight: 180 lbs, BMI: 30.9, Temperature: 97.9 F, Pulse: 72 bpm, Respiratory Rate: 16 breaths/min, Blood Pressure: 106/55 mmHg. Respiratory normal breathing without difficulty. Psychiatric this patient is able to make decisions and demonstrates good insight into disease process. Alert and Oriented x 3. pleasant and cooperative. Kathryn [name], Kathryn [name] ([id number]) [id number] Page 7 of 8 General Notes: Upon inspection patient's wound bed actually showed signs of poor granulation tissue. Subsequently I do believe that she is going to require sharp debridement to clearway some of the necrotic debris after I remove this I did go ahead and initiate a continuation of treatment with the obtaining of a wound culture and subsequently I also am going to switch the dressing again to Iodoflex which I think will be a better option here currently. She is not allergic to Betadine she tells me. Integumentary (Hair, Skin) Wound #9 status is Open. Original cause of wound was Gradually Appeared. The date acquired  was: 01/24/2022. The wound has been in treatment 1 [name] The wound is located on the Right T Second. The wound measures 0.6cm length x 0.9cm width x 0.3cm depth; 0.424cm^2 area and 0.127cm^3 volume. There is oe Fat Layer (Subcutaneous Tissue) exposed. There is a medium amount of serosanguineous drainage noted. There is no granulation within the wound bed. There is a large (67-100%) amount of necrotic tissue within the wound bed including Adherent Slough. Assessment Active Problems ICD-10 Type 2 diabetes mellitus with foot ulcer Other specified peripheral vascular diseases Non-pressure chronic ulcer of other part of right foot with fat layer exposed Muscle weakness (generalized) Chronic combined systolic (congestive) and diastolic (congestive) heart failure Essential (primary) hypertension Procedures Wound #9 Pre-procedure diagnosis of Wound #9 is a Diabetic Wound/Ulcer of the Lower Extremity located on the Right T Second .Severity of Tissue Pre Debridement oe is: Fat layer exposed. There was a Excisional Skin/Subcutaneous Tissue Debridement with a total area of 0.54 sq cm performed by Kathryn [name], PA-C. With the following instrument(s): Curette to remove Viable and Non-Viable tissue/material. Material removed includes Subcutaneous Tissue, Slough, and Biofilm. 1 specimen was taken by a Swab and sent to the lab per facility protocol. A time out was conducted at 09:15, prior to the start of the procedure. A Minimum amount of bleeding was controlled with Pressure. The procedure was tolerated well. Post Debridement Measurements: 0.6cm length x 0.9cm width x 0.3cm depth; 0.127cm^3 volume. Character of Wound/Ulcer Post Debridement is stable. Severity of Tissue Post Debridement is: Fat layer exposed. Post procedure Diagnosis Wound #9: Same as Pre-Procedure Plan Follow-up Appointments: Return Appointment in 1 week. Bathing/ Shower/ Hygiene: May shower; gently cleanse wound with antibacterial  soap, rinse and pat dry prior to dressing wounds Anesthetic (Use 'Patient Medications' Section for Anesthetic Order Entry): Lidocaine applied to wound bed Edema Control - Lymphedema / Segmental Compressive Device / Other: Elevate, Exercise Daily and Avoid Standing for Long Periods of Time. Elevate legs to the level of the heart and pump ankles as often as possible Elevate leg(s) parallel to the floor when sitting. DO YOUR BEST to sleep in the bed at night. DO NOT sleep in your recliner. Long hours of sitting in a recliner leads to swelling of the legs and/or potential wounds on your backside. Laboratory ordered were: Wound culture routine - R 2nd toe Radiology ordered were: X-ray, foot - 3-view Right foot. Non-healing open wound on 2nd  right toe WOUND #9: - T Second Wound Laterality: Right oe Cleanser: Soap and Water 3 x Per Week/30 Days Discharge Instructions: Gently cleanse wound with antibacterial soap, rinse and pat dry prior to dressing wounds Prim Dressing: IODOFLEX 0.9% Cadexomer Iodine Pad 3 x Per Week/30 Days ary Discharge Instructions: Apply Iodoflex to wound bed only as directed. Secondary Dressing: Coverlet Latex-Free Fabric Adhesive Dressings 3 x Per Week/30 Days Discharge Instructions: 1.5 x 2 1. I am good recommend based on what we are seeing that we have the patient going continue to monitor for any signs of infection or worsening. Based on what I am seeing I do believe that she is making some good progress here in general. 2. I am also can recommend that the patient should continue with the dressing changes regularly but I will switch her to Iodoflex which I think will be easier to both put on and keep the bandage in place. 3. I am also going to suggest that the patient should continue with the Band-Aid to cover. 4. I am good also send a wound culture and see what this shows currently. 5. I am also can recommend that we go ahead and send her for an x-ray to see if there  is any evidence of a deeper infection in the bone that would indicate osteomyelitis. We will see patient back for reevaluation in 1 week here in the clinic. If anything worsens or changes patient will contact our office for additional recommendations. The patient may end up needing an MRI of the foot to ensure that there is no signs of infection. Kathryn [name], Kathryn [name] ([id number]) [id number] Page 8 of 8 Electronic Signature(s) Signed: 05/14/2022 9:22:48 AM By: Worthy Keeler PA-C Entered By: Worthy Keeler on 05/14/2022 09:22:48 -------------------------------------------------------------------------------- SuperBill Details Patient Name: Date of Service: Kathryn [name], [name] 05/14/2022 Medical Record Number: [id number] Patient Account Number: [id number] Date of Birth/Sex: Treating RN: Mar 28, 1933 (89 y.o. Kathryn [name] Primary Care Provider: Eulis [name] Other Clinician: Referring Provider: Treating Provider/Extender: Kathryn [name] in Treatment: 1 Diagnosis Coding ICD-10 Codes Code Description [phone] Type 2 diabetes mellitus with foot ulcer I73.89 Other specified peripheral vascular diseases L97.512 Non-pressure chronic ulcer of other part of right foot with fat layer exposed M62.81 Muscle weakness (generalized) I50.42 Chronic combined systolic (congestive) and diastolic (congestive) heart failure I10 Essential (primary) hypertension Facility Procedures : CPT4 Code: [id number] Description: [id number] - DEB SUBQ TISSUE 20 SQ CM/< ICD-10 Diagnosis Description L97.512 Non-pressure chronic ulcer of other part of right foot with fat layer exposed Modifier: Quantity: 1 Physician Procedures : CPT4 Code Description Modifier [id number] - WC PHYS SUBQ TISS 20 SQ CM ICD-10 Diagnosis Description L97.512 Non-pressure chronic ulcer of other part of right foot with fat layer exposed Quantity: 1 Electronic Signature(s) Signed:  05/14/2022 9:22:56 AM By: Worthy Keeler PA-C Entered By: Worthy Keeler on 05/14/2022 09:22:56

## 2022-05-14 NOTE — Progress Notes (Signed)
LIEL, LERER ([id number]) [id number] Page 1 of 8 Visit Report for 05/14/2022 Arrival Information Details Patient Name: Date of Service: [name], [place] NNA L. 05/14/2022 8:45 A M Medical Record Number: [id number] Patient Account Number: [id number] Date of Birth/Sex: Treating RN: 1933/11/13 (89 y.o. [name] Primary Care Franciszek Platten: [name] Other Clinician: Referring Dianely Krehbiel: Treating Deeanna Beightol/Extender: Lance Coon in Treatment: 1 Visit Information History Since Last Visit Added or deleted any medications: No Patient Arrived: Wheel Chair Has Dressing in Place as Prescribed: Yes Arrival Time: 08:49 Pain Present Now: No Accompanied By: son,Steve Transfer Assistance: Manual Patient Identification Verified: Yes Secondary Verification Process Completed: Yes Patient Requires Transmission-Based Precautions: No Patient Has Alerts: Yes Patient Alerts: ABI L .82 02/06/22 ABI R .66 02/06/22 Electronic Signature(s) Signed: 05/14/2022 1:35:06 PM By: Gretta Cool, BSN, RN, CWS, Kim RN, BSN Entered By: Gretta Cool, BSN, RN, CWS, Kim on 05/14/2022 08:50:39 -------------------------------------------------------------------------------- Encounter Discharge Information Details Patient Name: Date of Service: [name], [place] NNA L. 05/14/2022 8:45 A M Medical Record Number: [id number] Patient Account Number: [id number] Date of Birth/Sex: Treating RN: 1933/06/06 (89 y.o. [name] Primary Care Vienne Corcoran: [name] Other Clinician: Referring Sandrina Heaton: Treating Malay Fantroy/Extender: Lance Coon in Treatment: 1 Encounter Discharge Information Items Post Procedure Vitals Discharge Condition: Stable Unable to obtain vitals Reason: timing Ambulatory Status: Wheelchair Discharge Destination: Home Transportation: Private Auto Accompanied By: Lynford Citizen Schedule Follow-up Appointment:  Yes Clinical Summary of Care: Electronic Signature(s) JASIE, FUCCI ([id number]) [phone].pdf Page 2 of 8 Signed: 05/14/2022 1:35:06 PM By: Gretta Cool, BSN, RN, CWS, Kim RN, BSN Entered By: Gretta Cool, BSN, RN, CWS, Kim on 05/14/2022 09:32:10 -------------------------------------------------------------------------------- Lower Extremity Assessment Details Patient Name: Date of Service: [name], DEA NNA L. 05/14/2022 8:45 A M Medical Record Number: [id number] Patient Account Number: [id number] Date of Birth/Sex: Treating RN: 08-22-1933 (89 y.o. [name] Primary Care Zarinah Oviatt: [name] Other Clinician: Referring Kaylee Trivett: Treating Tranesha Lessner/Extender: Jeri Cos [name], [name] in Treatment: 1 Edema Assessment Assessed: [Left: No] [Right: Yes] Edema: [Left: Ye] [Right: s] Calf Left: Right: Point of Measurement: 32 cm From Medial Instep 36 cm Ankle Left: Right: Point of Measurement: 10 cm From Medial Instep 24 cm Vascular Assessment Pulses: Dorsalis Pedis Palpable: [Right:Yes] Electronic Signature(s) Signed: 05/14/2022 1:35:06 PM By: Gretta Cool, BSN, RN, CWS, Kim RN, BSN Entered By: Gretta Cool, BSN, RN, CWS, Kim on 05/14/2022 09:00:21 -------------------------------------------------------------------------------- Multi Wound Chart Details Patient Name: Date of Service: [name], DEA NNA L. 05/14/2022 8:45 A M Medical Record Number: [id number] Patient Account Number: [id number] Date of Birth/Sex: Treating RN: 11-Mar-1934 (89 y.o. [name] Primary Care Patsye Sullivant: [name] Other Clinician: Referring Herminia Warren: Treating Shanikia Kernodle/Extender: Jeri Cos [name], [name] in Treatment: 1 Vital Signs Height(in): 64 Pulse(bpm): [place], [place] ([id number]) [id number] Page 3 of 8 Weight(lbs): 180 Blood Pressure(mmHg): 106/55 Body Mass Index(BMI): 30.9 Temperature(F):  97.9 Respiratory Rate(breaths/min): 16 [9:Photos:] [N/A:N/A] Right T Second oe N/A N/A Wound Location: Gradually Appeared N/A N/A Wounding Event: Diabetic Wound/Ulcer of the Lower N/A N/A Primary Etiology: Extremity Chronic Obstructive Pulmonary N/A N/A Comorbid History: Disease (COPD), Congestive Heart Failure, Coronary Artery Disease, Hypertension, Peripheral Arterial Disease, Peripheral Venous Disease, Type II Diabetes, Rheumatoid Arthritis, Neuropathy 01/24/2022 N/A N/A Date Acquired: 1 N/A N/A Weeks of Treatment: Open N/A N/A Wound Status: No N/A N/A Wound Recurrence: 0.6x0.9x0.3 N/A N/A Measurements L x W x D (cm) 0.424 N/A N/A A (cm) : rea 0.127 N/A N/A Volume (cm) : -236.50% N/A N/A % Reduction  in A rea: -234.20% N/A N/A % Reduction in Volume: Grade 1 N/A N/A Classification: Medium N/A N/A Exudate A mount: Serosanguineous N/A N/A Exudate Type: red, brown N/A N/A Exudate Color: None Present (0%) N/A N/A Granulation A mount: Large (67-100%) N/A N/A Necrotic A mount: Fat Layer (Subcutaneous Tissue): Yes N/A N/A Exposed Structures: Fascia: No Tendon: No Muscle: No Joint: No Bone: No None N/A N/A Epithelialization: Debridement - Excisional N/A N/A Debridement: Pre-procedure Verification/Time Out 09:15 N/A N/A Taken: Subcutaneous, Slough N/A N/A Tissue Debrided: Skin/Subcutaneous Tissue N/A N/A Level: 0.54 N/A N/A Debridement A (sq cm): rea Curette N/A N/A Instrument: Swab N/A N/A Specimen: 1 N/A N/A Number of Specimens Taken: Minimum N/A N/A Bleeding: Pressure N/A N/A Hemostasis A chieved: Procedure was tolerated well N/A N/A Debridement Treatment Response: 0.6x0.9x0.3 N/A N/A Post Debridement Measurements L x W x D (cm) 0.127 N/A N/A Post Debridement Volume: (cm) Debridement N/A N/A Procedures Performed: Treatment Notes Electronic Signature(s) Signed: 05/14/2022 1:35:06 PM By: Gretta Cool, BSN, RN, CWS, Kim RN, BSN Entered By:  Gretta Cool, BSN, RN, CWS, Kim on 05/14/2022 09:28:18 Thom Chimes ([id number].pdf Page 4 of 8 -------------------------------------------------------------------------------- Multi-Disciplinary Care Plan Details Patient Name: Date of Service: [name], [place] NNA L. 05/14/2022 8:45 A M Medical Record Number: [id number] Patient Account Number: [id number] Date of Birth/Sex: Treating RN: 10-Jun-1933 (89 y.o. [name] Primary Care Annaclaire Walsworth: [name] Other Clinician: Referring Kimmora Risenhoover: Treating Ernestine Rohman/Extender: Armandina Gemma Weeks in Treatment: 1 Active Inactive Necrotic Tissue Nursing Diagnoses: Knowledge deficit related to management of necrotic/devitalized tissue Goals: Necrotic/devitalized tissue will be minimized in the wound bed Date Initiated: 05/07/2022 Target Resolution Date: 06/05/2022 Goal Status: Active Interventions: Assess patient pain level pre-, during and post procedure and prior to discharge Provide education on necrotic tissue and debridement process Notes: Wound/Skin Impairment Nursing Diagnoses: Knowledge deficit related to ulceration/compromised skin integrity Goals: Patient/caregiver will verbalize understanding of skin care regimen Date Initiated: 05/07/2022 Date Inactivated: 05/14/2022 Target Resolution Date: 06/05/2022 Goal Status: Met Ulcer/skin breakdown will have a volume reduction of 30% by week 4 Date Initiated: 05/07/2022 Target Resolution Date: 06/05/2022 Goal Status: Active Ulcer/skin breakdown will have a volume reduction of 50% by week 8 Date Initiated: 05/07/2022 Target Resolution Date: 07/06/2022 Goal Status: Active Ulcer/skin breakdown will have a volume reduction of 80% by week 12 Date Initiated: 05/07/2022 Target Resolution Date: 08/05/2022 Goal Status: Active Ulcer/skin breakdown will heal within 14 weeks Date Initiated: 05/07/2022 Target Resolution Date:  09/05/2022 Goal Status: Active Interventions: Assess patient/caregiver ability to obtain necessary supplies Assess patient/caregiver ability to perform ulcer/skin care regimen upon admission and as needed Assess ulceration(s) every visit Notes: Electronic Signature(s) Signed: 05/14/2022 1:35:06 PM By: Gretta Cool, BSN, RN, CWS, Kim RN, BSN Entered By: Gretta Cool, BSN, RN, CWS, Kim on 05/14/2022 [id number] Thom Chimes ([id number]) [id number] Page 5 of 8 -------------------------------------------------------------------------------- Pain Assessment Details Patient Name: Date of Service: [name] NNA L. 05/14/2022 8:45 A M Medical Record Number: [id number] Patient Account Number: [id number] Date of Birth/Sex: Treating RN: 1933-10-25 (89 y.o. [name] Primary Care Esther Broyles: [name] Other Clinician: Referring Tamla Winkels: Treating Sylena Lotter/Extender: Armandina Gemma Weeks in Treatment: 1 Active Problems Location of Pain Severity and Description of Pain Patient Has Paino No Site Locations Pain Management and Medication Current Pain Management: Electronic Signature(s) Signed: 05/14/2022 1:35:06 PM By: Gretta Cool, BSN, RN, CWS, Kim RN, BSN Entered By: Gretta Cool, BSN, RN, CWS, Kim on 05/14/2022 08:53:09 -------------------------------------------------------------------------------- Patient/Caregiver Education Details Patient Name: Date of Service: [name],  DEA NNA L. 2/19/2024andnbsp8:45 A M Medical Record Number: [id number] Patient Account Number: [id number] Date of Birth/Gender: Treating RN: April 21, 1933 (89 y.o. [name] Primary Care Physician: [name] Other Clinician: Referring Physician: Treating Physician/Extender: Jeri Cos [name], [name] in Treatment: Thayer, Butler ([id number]) [id number] Page 6 of 8 Education Assessment Education Provided  To: Patient Education Topics Provided Wound Debridement: Handouts: Wound Debridement Methods: Demonstration Responses: State content correctly Electronic Signature(s) Signed: 05/14/2022 1:35:06 PM By: Gretta Cool, BSN, RN, CWS, Kim RN, BSN Entered By: Gretta Cool, BSN, RN, CWS, Kim on 05/14/2022 09:31:07 -------------------------------------------------------------------------------- Wound Assessment Details Patient Name: Date of Service: [name], DEA NNA L. 05/14/2022 8:45 A M Medical Record Number: [id number] Patient Account Number: [id number] Date of Birth/Sex: Treating RN: July 07, 1933 (89 y.o. [name] Primary Care Bobby Barton: [name] Other Clinician: Referring Chidera Dearcos: Treating Alegria Dominique/Extender: Jeri Cos [name], [name] in Treatment: 1 Wound Status Wound Number: 9 Primary Diabetic Wound/Ulcer of the Lower Extremity Etiology: Wound Location: Right T Second oe Wound Open Wounding Event: Gradually Appeared Status: Date Acquired: 01/24/2022 Comorbid Chronic Obstructive Pulmonary Disease (COPD), Congestive Heart Weeks Of Treatment: 1 History: Failure, Coronary Artery Disease, Hypertension, Peripheral Arterial Clustered Wound: No Disease, Peripheral Venous Disease, Type II Diabetes, Rheumatoid Arthritis, Neuropathy Photos Wound Measurements Length: (cm) 0.6 Width: (cm) 0.9 Depth: (cm) 0.3 Area: (cm) 0.424 Volume: (cm) 0.127 % Reduction in Area: -236.5% % Reduction in Volume: -234.2% Epithelialization: None Wound Description Classification: Grade 2 Exudate Amount: Medium Barrientes, Chermaine L ([id number]) Exudate Type: Serosanguineous Exudate Color: red, brown Foul Odor After Cleansing: No Slough/Fibrino Yes [id number] Page 7 of 8 Wound Bed Granulation Amount: None Present (0%) Exposed Structure Necrotic Amount: Large (67-100%) Fascia Exposed: No Necrotic Quality: Adherent Slough Fat Layer (Subcutaneous Tissue)  Exposed: Yes Tendon Exposed: No Muscle Exposed: No Joint Exposed: No Bone Exposed: No Treatment Notes Wound #9 (Toe Second) Wound Laterality: Right Cleanser Soap and Water Discharge Instruction: Gently cleanse wound with antibacterial soap, rinse and pat dry prior to dressing wounds Peri-Wound Care Topical Primary Dressing IODOFLEX 0.9% Cadexomer Iodine Pad Discharge Instruction: Apply Iodoflex to wound bed only as directed. Secondary Dressing Coverlet Latex-Free Fabric Adhesive Dressings Discharge Instruction: 1.5 x 2 Secured With Compression Wrap Compression Stockings Add-Ons Electronic Signature(s) Signed: 05/14/2022 11:39:33 AM By: Gretta Cool, BSN, RN, CWS, Kim RN, BSN Entered By: Gretta Cool, BSN, RN, CWS, Kim on 05/14/2022 11:39:33 -------------------------------------------------------------------------------- Vitals Details Patient Name: Date of Service: [name], DEA NNA L. 05/14/2022 8:45 A M Medical Record Number: [id number] Patient Account Number: [id number] Date of Birth/Sex: Treating RN: 1933-11-04 (89 y.o. [name] Primary Care Antia Rahal: [name] Other Clinician: Referring Darryl Blumenstein: Treating Jalen Daluz/Extender: Jeri Cos [name], [name] in Treatment: 1 Vital Signs Time Taken: 08:50 Temperature (F): 97.9 Height (in): 64 Pulse (bpm): 72 Weight (lbs): 180 Respiratory Rate (breaths/min): 16 Body Mass Index (BMI): 30.9 Blood Pressure (mmHg): 106/55 Reference Range: 80 - 120 mg / dl Electronic Signature(s) Badia, Nickie L ([id number]) [id number] Page 8 of 8 Signed: 05/14/2022 1:35:06 PM By: Gretta Cool, BSN, RN, CWS, Kim RN, BSN Entered By: Gretta Cool, BSN, RN, CWS, Kim on 05/14/2022 08:53:01

## 2022-05-17 LAB — AEROBIC CULTURE W GRAM STAIN (SUPERFICIAL SPECIMEN)
Culture: NO GROWTH
Gram Stain: NONE SEEN

## 2022-05-21 ENCOUNTER — Encounter: Payer: Medicare HMO | Admitting: Physician Assistant

## 2022-05-21 DIAGNOSIS — E11621 Type 2 diabetes mellitus with foot ulcer: Secondary | ICD-10-CM | POA: Diagnosis not present

## 2022-05-22 NOTE — Progress Notes (Signed)
**Note Kathryn-Identified via Obfuscation** Kathryn [name] ([id number]) [id number] Page 1 of 8 Visit Report for 05/21/2022 Chief Complaint Document Details Patient Name: Date of Service: Kathryn All [name] 05/21/2022 3:30 PM Medical Record Number: [id number] Patient Account Number: [id number] Date of Birth/Sex: Treating RN: 08-30-1933 (89 y.o. Kathryn [name] Primary Care Provider: [name] Other Clinician: Massie Kluver Referring Provider: Treating Provider/Extender: Armandina Gemma Weeks in Treatment: 2 Information Obtained from: Patient Chief Complaint Right second toe ulcer Electronic Signature(s) Signed: 05/21/2022 4:09:22 PM By: Worthy Keeler PA-C Entered By: Worthy Keeler on 05/21/2022 [id number] -------------------------------------------------------------------------------- Debridement Details Patient Name: Date of Service: Kathryn [name], [name] 05/21/2022 3:30 PM Medical Record Number: [id number] Patient Account Number: [id number] Date of Birth/Sex: Treating RN: 1934-03-10 (89 y.o. Kathryn [name] Primary Care Provider: [name] Other Clinician: Massie Kluver Referring Provider: Treating Provider/Extender: Armandina Gemma Weeks in Treatment: 2 Debridement Performed for Assessment: Wound #9 Right T Second oe Performed By: Physician Tommie Sams., PA-C Debridement Type: Debridement Severity of Tissue Pre Debridement: Fat layer exposed Level of Consciousness (Pre-procedure): Awake and Alert Pre-procedure Verification/Time Out Yes - 16:32 Taken: Start Time: 16:32 T Area Debrided ([name] x W): otal 0.5 (cm) x 0.6 (cm) = 0.3 (cm) Tissue and other material debrided: Viable, Non-Viable, Slough, Subcutaneous, Slough Level: Skin/Subcutaneous Tissue Debridement Description: Excisional Instrument: Curette Bleeding: Minimum Hemostasis Achieved: Pressure Response to Treatment: Procedure was tolerated well Level of  Consciousness (Post- Awake and Alert procedure): Kathryn [name], Kathryn [name] ([id number]) [id number] Page 2 of 8 Post Debridement Measurements of Total Wound Length: (cm) 0.5 Width: (cm) 0.6 Depth: (cm) 0.3 Volume: (cm) 0.071 Character of Wound/Ulcer Post Debridement: Stable Severity of Tissue Post Debridement: Fat layer exposed Post Procedure Diagnosis Same as Pre-procedure Electronic Signature(s) Signed: 05/21/2022 5:34:14 PM By: Worthy Keeler PA-C Signed: 05/21/2022 6:05:58 PM By: Gretta Cool BSN, RN, CWS, Kim RN, BSN Signed: 05/22/2022 11:58:18 AM By: Massie Kluver Entered By: Massie Kluver on 05/21/2022 16:33:46 -------------------------------------------------------------------------------- HPI Details Patient Name: Date of Service: Kathryn [name], [name] 05/21/2022 3:30 PM Medical Record Number: [id number] Patient Account Number: [id number] Date of Birth/Sex: Treating RN: Jun 19, 1933 (89 y.o. Kathryn [name] Primary Care Provider: [name] Other Clinician: Massie Kluver Referring Provider: Treating Provider/Extender: Armandina Gemma Weeks in Treatment: 2 History of Present Illness HPI Description: 02-06-2022 upon evaluation today patient presents for initial inspection here in our clinic concerning wounds that she has over the bilateral feet as well as the right leg. She has been tolerating the dressing changes in general here without complication she has been putting antibiotic ointment on this. With that being said I do believe that she would benefit from the use of something like Xeroform gauze dressings which would likely be a bit better. Fortunately I do not see any signs of infection which is good news do not think she needs any antibiotics. She tells me that these wounds typically occur as a result of her bumping or hitting her leg and then they slowly progressed from there. Patient does have a history of diabetes for  which her hemoglobin A1c was 6.9 measured on 12-12-2021. She also has a history of arterial insufficiency with a right ABI of 0.66 with a TBI of 0.61 and a left ABI of 0.82 with a TBI of 0.45. She has a follow-up evaluation on 02-27-2022 with vascular. This testing was done on 01-28-2022. Subsequently the patient also has a history of hypertension, generalized weakness, congestive heart failure, and lower extremity  edema. 02-13-2022 upon evaluation today patient appears to be doing well currently in regard to her wounds. She is actually making some good progress here which is great news and overall I am extremely pleased with where we stand. I do not see any evidence of worsening. 02-22-2022 upon evaluation today patient's wounds actually are showing some signs of improvement for the most part. Fortunately there does not appear to be any evidence of infection locally nor systemically at this point which is great news. No fevers, chills, nausea, vomiting, or diarrhea. She does have an a vascular appointment on December 5 and we will see what they have to say at that point. 03-08-2022 upon evaluation today patient actually appears to be doing excellent in regards to her wounds. She has been tolerating the dressing changes without complication. Fortunately there is no sign of active infection locally nor systemically at this point which is great news. Readmission: 05-07-2022 upon evaluation today patient presents for reevaluation here in the clinic it has been actually December 14 since I have last seen her. Subsequently she does still have a wound on the right second toe. This is the one area that has not healed everything on the left ended up healing. Fortunately I do not see any signs of infection at this point and really nothing is changed in her medical history since the last evaluation. She just tells me that this wound is just being stubborn and does not want to heal. 05-14-2022 upon evaluation today  patient appears to be doing poorly in regard to her toe ulcer. Unfortunately she is showing signs of this being a little worse than last week. I was hopeful that this would actually show some signs of improvement but were definitely not seeing that. I think we probably need to switch to a different dressing, recommend Iodosorb/Iodoflex as an option here for her. 05-21-2022 upon evaluation today patient appears to be doing well currently in regard to her toe ulcer. I did review her culture as well as her x-ray both are negative at this point. There is no growth after 3 days. With that being said that she has been exposed this actually is looking much better Is read as it was and overall much happier with where things stand currently. I do not see any signs of infection locally nor systemically at this point. Electronic Signature(s) Kathryn [name], Kathryn [name] ([id number]) [id number] Page 3 of 8 Signed: 05/21/2022 4:51:49 PM By: Worthy Keeler PA-C Entered By: Worthy Keeler on 05/21/2022 16:51:49 -------------------------------------------------------------------------------- Physical Exam Details Patient Name: Date of Service: Kathryn [name], [name] 05/21/2022 3:30 PM Medical Record Number: [id number] Patient Account Number: [id number] Date of Birth/Sex: Treating RN: Aug 12, 1933 (89 y.o. Kathryn [name] Primary Care Provider: [name] Other Clinician: Massie Kluver Referring Provider: Treating Provider/Extender: Jeri Cos [name], [name] in Treatment: 2 Constitutional Well-nourished and well-hydrated in no acute distress. Respiratory normal breathing without difficulty. Psychiatric this patient is able to make decisions and demonstrates good insight into disease process. Alert and Oriented x 3. pleasant and cooperative. Notes Upon inspection patient's wound bed actually showed signs of good granulation epithelization at this point. Fortunately  I do not see any evidence of active infection at this time which is great news and overall I am extremely pleased with where we stand currently. I did perform debridement clearway some of the slough and biofilm down to good subcutaneous tissue there was some bone in the base of the wound not completely exposed but nonetheless  there right now we will hold off on MRI since this seems to be doing so well and all testing up to this point is negative. Electronic Signature(s) Signed: 05/21/2022 4:52:28 PM By: Worthy Keeler PA-C Entered By: Worthy Keeler on 05/21/2022 16:52:28 -------------------------------------------------------------------------------- Physician Orders Details Patient Name: Date of Service: Kathryn [name], [name] 05/21/2022 3:30 PM Medical Record Number: [id number] Patient Account Number: [id number] Date of Birth/Sex: Treating RN: [birth year] (89 y.o. Kathryn [name] Primary Care Provider: [name] Other Clinician: Massie Kluver Referring Provider: Treating Provider/Extender: Armandina Gemma Weeks in Treatment: 2 Verbal / Phone Orders: No Diagnosis Coding ICD-10 Coding Code Description E11.621 Type 2 diabetes mellitus with foot ulcer I73.89 Other specified peripheral vascular diseases Kathryn [name], Kathryn [name] ([id number]) [id number] Page 4 of 8 L97.512 Non-pressure chronic ulcer of other part of right foot with fat layer exposed M62.81 Muscle weakness (generalized) I50.42 Chronic combined systolic (congestive) and diastolic (congestive) heart failure I10 Essential (primary) hypertension Follow-up Appointments Return Appointment in 1 week. Bathing/ Shower/ Hygiene May shower; gently cleanse wound with antibacterial soap, rinse and pat dry prior to dressing wounds Anesthetic (Use 'Patient Medications' Section for Anesthetic Order Entry) Lidocaine applied to wound bed Edema Control - Lymphedema / Segmental  Compressive Device / Other Elevate, Exercise Daily and A void Standing for Long Periods of Time. Elevate legs to the level of the heart and pump ankles as often as possible Elevate leg(s) parallel to the floor when sitting. DO YOUR BEST to sleep in the bed at night. DO NOT sleep in your recliner. Long hours of sitting in a recliner leads to swelling of the legs and/or potential wounds on your backside. Wound Treatment Wound #9 - T Second oe Wound Laterality: Right Cleanser: Soap and Water 3 x Per Week/30 Days Discharge Instructions: Gently cleanse wound with antibacterial soap, rinse and pat dry prior to dressing wounds Prim Dressing: IODOFLEX 0.9% Cadexomer Iodine Pad 3 x Per Week/30 Days ary Discharge Instructions: Apply Iodoflex to wound bed only as directed. Secondary Dressing: Coverlet Latex-Free Fabric Adhesive Dressings 3 x Per Week/30 Days Discharge Instructions: 1.5 x 2 Electronic Signature(s) Signed: 05/21/2022 5:34:14 PM By: Worthy Keeler PA-C Signed: 05/22/2022 11:58:18 AM By: Massie Kluver Entered By: Massie Kluver on 05/21/2022 16:34:26 -------------------------------------------------------------------------------- Problem List Details Patient Name: Date of Service: Kathryn [name], [name] 05/21/2022 3:30 PM Medical Record Number: [id number] Patient Account Number: [id number] Date of Birth/Sex: Treating RN: [birth year] (89 y.o. Kathryn [name] Primary Care Provider: [name] Other Clinician: Massie Kluver Referring Provider: Treating Provider/Extender: Armandina Gemma Weeks in Treatment: 2 Active Problems ICD-10 Encounter Code Description Active Date MDM Diagnosis E11.621 Type 2 diabetes mellitus with foot ulcer 05/07/2022 No Yes I73.89 Other specified peripheral vascular diseases 05/07/2022 No Yes Kathryn [name], Kathryn [name] ([id number]) [id number] Page 5 of 8 [phone] Non-pressure chronic ulcer of other part of  right foot with fat layer exposed 05/07/2022 No Yes M62.81 Muscle weakness (generalized) 05/07/2022 No Yes I50.42 Chronic combined systolic (congestive) and diastolic (congestive) heart failure 05/07/2022 No Yes I10 Essential (primary) hypertension 05/07/2022 No Yes Inactive Problems Resolved Problems Electronic Signature(s) Signed: 05/21/2022 4:09:18 PM By: Worthy Keeler PA-C Entered By: Worthy Keeler on 05/21/2022 16:09:18 -------------------------------------------------------------------------------- Progress Note Details Patient Name: Date of Service: Kathryn [name], [name] 05/21/2022 3:30 PM Medical Record Number: [id number] Patient Account Number: [id number] Date of Birth/Sex: Treating RN: 1933-07-29 (89 y.o. Kathryn [name] Primary Care Provider: [name] Other Clinician: Massie Kluver Referring  Provider: Treating Provider/Extender: Verne Carrow, [name] in Treatment: 2 Subjective Chief Complaint Information obtained from Patient Right second toe ulcer History of Present Illness (HPI) 02-06-2022 upon evaluation today patient presents for initial inspection here in our clinic concerning wounds that she has over the bilateral feet as well as the right leg. She has been tolerating the dressing changes in general here without complication she has been putting antibiotic ointment on this. With that being said I do believe that she would benefit from the use of something like Xeroform gauze dressings which would likely be a bit better. Fortunately I do not see any signs of infection which is good news do not think she needs any antibiotics. She tells me that these wounds typically occur as a result of her bumping or hitting her leg and then they slowly progressed from there. Patient does have a history of diabetes for which her hemoglobin A1c was 6.9 measured on 12-12-2021. She also has a history of arterial insufficiency with a right ABI of 0.66 with  a TBI of 0.61 and a left ABI of 0.82 with a TBI of 0.45. She has a follow-up evaluation on 02-27-2022 with vascular. This testing was done on 01-28-2022. Subsequently the patient also has a history of hypertension, generalized weakness, congestive heart failure, and lower extremity edema. 02-13-2022 upon evaluation today patient appears to be doing well currently in regard to her wounds. She is actually making some good progress here which is great news and overall I am extremely pleased with where we stand. I do not see any evidence of worsening. 02-22-2022 upon evaluation today patient's wounds actually are showing some signs of improvement for the most part. Fortunately there does not appear to be any evidence of infection locally nor systemically at this point which is great news. No fevers, chills, nausea, vomiting, or diarrhea. She does have an a vascular appointment on December 5 and we will see what they have to say at that point. 03-08-2022 upon evaluation today patient actually appears to be doing excellent in regards to her wounds. She has been tolerating the dressing changes without complication. Fortunately there is no sign of active infection locally nor systemically at this point which is great news. Readmission: 05-07-2022 upon evaluation today patient presents for reevaluation here in the clinic it has been actually December 14 since I have last seen her. Subsequently she does still have a wound on the right second toe. This is the one area that has not healed everything on the left ended up healing. Fortunately I do not see Kathryn [name], Kathryn [name] ([id number]) [id number] Page 6 of 8 any signs of infection at this point and really nothing is changed in her medical history since the last evaluation. She just tells me that this wound is just being stubborn and does not want to heal. 05-14-2022 upon evaluation today patient appears to be doing poorly in regard to her toe  ulcer. Unfortunately she is showing signs of this being a little worse than last week. I was hopeful that this would actually show some signs of improvement but were definitely not seeing that. I think we probably need to switch to a different dressing, recommend Iodosorb/Iodoflex as an option here for her. 05-21-2022 upon evaluation today patient appears to be doing well currently in regard to her toe ulcer. I did review her culture as well as her x-ray both are negative at this point. There is no growth after 3 days. With that being said  that she has been exposed this actually is looking much better Is read as it was and overall much happier with where things stand currently. I do not see any signs of infection locally nor systemically at this point. Objective Constitutional Well-nourished and well-hydrated in no acute distress. Vitals Time Taken: 3:45 PM, Height: 64 in, Weight: 180 lbs, BMI: 30.9, Temperature: 97.7 F, Pulse: 75 bpm, Respiratory Rate: 16 breaths/min, Blood Pressure: 113/62 mmHg. Respiratory normal breathing without difficulty. Psychiatric this patient is able to make decisions and demonstrates good insight into disease process. Alert and Oriented x 3. pleasant and cooperative. General Notes: Upon inspection patient's wound bed actually showed signs of good granulation epithelization at this point. Fortunately I do not see any evidence of active infection at this time which is great news and overall I am extremely pleased with where we stand currently. I did perform debridement clearway some of the slough and biofilm down to good subcutaneous tissue there was some bone in the base of the wound not completely exposed but nonetheless there right now we will hold off on MRI since this seems to be doing so well and all testing up to this point is negative. Integumentary (Hair, Skin) Wound #9 status is Open. Original cause of wound was Gradually Appeared. The date acquired was:  01/24/2022. The wound has been in treatment 2 weeks. The wound is located on the Right T Second. The wound measures 0.5cm length x 0.6cm width x 0.2cm depth; 0.236cm^2 area and 0.047cm^3 volume. There is oe Fat Layer (Subcutaneous Tissue) exposed. There is a medium amount of serosanguineous drainage noted. There is no granulation within the wound bed. There is a large (67-100%) amount of necrotic tissue within the wound bed including Adherent Slough. Assessment Active Problems ICD-10 Type 2 diabetes mellitus with foot ulcer Other specified peripheral vascular diseases Non-pressure chronic ulcer of other part of right foot with fat layer exposed Muscle weakness (generalized) Chronic combined systolic (congestive) and diastolic (congestive) heart failure Essential (primary) hypertension Procedures Wound #9 Pre-procedure diagnosis of Wound #9 is a Diabetic Wound/Ulcer of the Lower Extremity located on the Right T Second .Severity of Tissue Pre Debridement oe is: Fat layer exposed. There was a Excisional Skin/Subcutaneous Tissue Debridement with a total area of 0.3 sq cm performed by Tommie Sams., PA-C. With the following instrument(s): Curette to remove Viable and Non-Viable tissue/material. Material removed includes Subcutaneous Tissue and Slough and. A time out was conducted at 16:32, prior to the start of the procedure. A Minimum amount of bleeding was controlled with Pressure. The procedure was tolerated well. Post Debridement Measurements: 0.5cm length x 0.6cm width x 0.3cm depth; 0.071cm^3 volume. Character of Wound/Ulcer Post Debridement is stable. Severity of Tissue Post Debridement is: Fat layer exposed. Post procedure Diagnosis Wound #9: Same as Pre-Procedure Plan Follow-up Appointments: Return Appointment in 1 week. Bathing/ Shower/ Hygiene: Kathryn [name], Kathryn [name] ([id number]) [id number] Page 7 of 8 May shower; gently cleanse wound with antibacterial soap,  rinse and pat dry prior to dressing wounds Anesthetic (Use 'Patient Medications' Section for Anesthetic Order Entry): Lidocaine applied to wound bed Edema Control - Lymphedema / Segmental Compressive Device / Other: Elevate, Exercise Daily and Avoid Standing for Long Periods of Time. Elevate legs to the level of the heart and pump ankles as often as possible Elevate leg(s) parallel to the floor when sitting. DO YOUR BEST to sleep in the bed at night. DO NOT sleep in your recliner. Long hours of sitting in a recliner leads  to swelling of the legs and/or potential wounds on your backside. WOUND #9: - T Second Wound Laterality: Right oe Cleanser: Soap and Water 3 x Per Week/30 Days Discharge Instructions: Gently cleanse wound with antibacterial soap, rinse and pat dry prior to dressing wounds Prim Dressing: IODOFLEX 0.9% Cadexomer Iodine Pad 3 x Per Week/30 Days ary Discharge Instructions: Apply Iodoflex to wound bed only as directed. Secondary Dressing: Coverlet Latex-Free Fabric Adhesive Dressings 3 x Per Week/30 Days Discharge Instructions: 1.5 x 2 1. I would recommend that we have the patient continue to monitor for any signs of infection or worsening. Obviously if anything changes she will definitely contact the office let me know but I am hopeful that this will continue to improve week by week. We need to get this bone covered and then subsequently get the skin to cover over top of course. 2. I am also can recommend we continue with Iodoflex which is doing well. 3. She does have an area where she had a rubbing spot on a left foot I believe it was her third or fourth toe. Nonetheless it did not appear to be legitimately opened today but we will check this next week if it is open will [place] at that point. We will see patient back for reevaluation in 1 week here in the clinic. If anything worsens or changes patient will contact our office for additional recommendations. Electronic  Signature(s) Signed: 05/21/2022 4:53:05 PM By: Worthy Keeler PA-C Entered By: Worthy Keeler on 05/21/2022 16:53:05 -------------------------------------------------------------------------------- SuperBill Details Patient Name: Date of Service: Kathryn [name], [name] 05/21/2022 Medical Record Number: [id number] Patient Account Number: [id number] Date of Birth/Sex: Treating RN: Nov 07, 1933 (89 y.o. Charolette Forward, Kim Primary Care Provider: [name] Other Clinician: Massie Kluver Referring Provider: Treating Provider/Extender: Armandina Gemma Weeks in Treatment: 2 Diagnosis Coding ICD-10 Codes Code Description [phone] Type 2 diabetes mellitus with foot ulcer I73.89 Other specified peripheral vascular diseases L97.512 Non-pressure chronic ulcer of other part of right foot with fat layer exposed M62.81 Muscle weakness (generalized) I50.42 Chronic combined systolic (congestive) and diastolic (congestive) heart failure I10 Essential (primary) hypertension Facility Procedures : CPT4 Code: [id number] Description: [id number] - DEB SUBQ TISSUE 20 SQ CM/< ICD-10 Diagnosis Description L97.512 Non-pressure chronic ulcer of other part of right foot with fat layer exposed Modifier: Quantity: 1 Physician Procedures Kathryn [name], [facility] ([id number]): CPT4 Code Description [id number] - WC PHYS SUBQ TISS 20 SQ CM ICD-10 Diagnosis Description L97.512 Non-pressure chronic ulcer of other part of right foot with fat [name] [id number] Page 8 of 8: Quantity Modifier 1 ayer exposed Electronic Signature(s) Signed: 05/21/2022 4:53:25 PM By: Worthy Keeler PA-C Entered By: Worthy Keeler on 05/21/2022 16:53:25

## 2022-05-23 NOTE — Progress Notes (Signed)
Kathryn, [name] ([id number]) [phone].pdf Page 1 of 9 Visit Report for 05/21/2022 Arrival Information Details Patient Name: Date of Service: Kathryn [name], [place] NNA L. 05/21/2022 3:30 PM Medical Record Number: [id number] Patient Account Number: [id number] Date of Birth/Sex: Treating RN: 10-27-1933 (89 y.o. Kathryn [name] Primary Care Satya Bohall: [name] Other Clinician: Massie Kluver Referring Romy Ipock: Treating Jamilex Bohnsack/Extender: Lance Coon in Treatment: 2 Visit Information History Since Last Visit [name] ordered tests and consults were completed: No Patient Arrived: Wheel Chair Added or deleted any medications: No Arrival Time: 15:39 Any new allergies or adverse reactions: No Transfer Assistance: EasyPivot Patient Lift Had a fall or experienced change in No Patient Identification Verified: Yes activities of daily living that may affect Secondary Verification Process Completed: Yes risk of falls: Patient Requires Transmission-Based Precautions: No Signs or symptoms of abuse/neglect since last visito No Patient Has Alerts: Yes Hospitalized since last visit: No Patient Alerts: ABI L .82 02/06/22 Implantable device outside of the clinic excluding No ABI R .66 02/06/22 cellular tissue based products placed in the center since last visit: Has Dressing in Place as Prescribed: Yes Pain Present Now: Yes Electronic Signature(s) Signed: 05/22/2022 11:58:18 AM By: Massie Kluver Entered By: Massie Kluver on 05/21/2022 15:44:51 -------------------------------------------------------------------------------- Clinic Level of Care Assessment Details Patient Name: Date of Service: Kathryn [name] NNA L. 05/21/2022 3:30 PM Medical Record Number: [id number] Patient Account Number: [id number] Date of Birth/Sex: Treating RN: Oct 19, 1933 (89 y.o. Kathryn [name] Primary Care Yosselyn Tax: [name] Other Clinician: Massie Kluver Referring Amanda Steuart: Treating Meleni Delahunt/Extender: Jeri Cos [name], [name] in Treatment: 2 Clinic Level of Care Assessment Items TOOL 1 Quantity Score '[]'$  - 0 Use when EandM and Procedure is performed on INITIAL visit ASSESSMENTS - Nursing Assessment / Reassessment '[]'$  - 0 General Physical Exam (combine w/ comprehensive assessment (listed just below) when performed on new pt. evals) '[]'$  - 0 Comprehensive Assessment (HX, ROS, Risk Assessments, Wounds Hx, etc.) Munford, Renly L ([id number]) [phone].pdf Page 2 of 9 ASSESSMENTS - Wound and Skin Assessment / Reassessment '[]'$  - 0 Dermatologic / Skin Assessment (not related to wound area) ASSESSMENTS - Ostomy and/or Continence Assessment and Care '[]'$  - 0 Incontinence Assessment and Management '[]'$  - 0 Ostomy Care Assessment and Management (repouching, etc.) PROCESS - Coordination of Care '[]'$  - 0 Simple Patient / Family Education for ongoing care '[]'$  - 0 Complex (extensive) Patient / Family Education for ongoing care '[]'$  - 0 Staff obtains Programmer, systems, Records, T Results / Process Orders est '[]'$  - 0 Staff telephones HHA, Nursing Homes / Clarify orders / etc '[]'$  - 0 Routine Transfer to another Facility (non-emergent condition) '[]'$  - 0 Routine Hospital Admission (non-emergent condition) '[]'$  - 0 New Admissions / Biomedical engineer / Ordering NPWT Apligraf, etc. , '[]'$  - 0 Emergency Hospital Admission (emergent condition) PROCESS - Special Needs '[]'$  - 0 Pediatric / Minor Patient Management '[]'$  - 0 Isolation Patient Management '[]'$  - 0 Hearing / Language / Visual special needs '[]'$  - 0 Assessment of Community assistance (transportation, D/C planning, etc.) '[]'$  - 0 Additional assistance / Altered mentation '[]'$  - 0 Support Surface(s) Assessment (bed, cushion, seat, etc.) INTERVENTIONS - Miscellaneous '[]'$  - 0 External ear exam '[]'$  - 0 Patient Transfer (multiple staff / Civil Service fast streamer / Similar  devices) '[]'$  - 0 Simple Staple / Suture removal (25 or less) '[]'$  - 0 Complex Staple / Suture removal (26 or more) '[]'$  - 0 Hypo/Hyperglycemic Management (do not check if billed separately) '[]'$  - 0 Ankle /  Brachial Index (ABI) - do not check if billed separately Has the patient been seen at the hospital within the last three years: Yes Total Score: 0 Level Of Care: ____ Electronic Signature(s) Signed: 05/22/2022 11:58:18 AM By: Massie Kluver Entered By: Massie Kluver on 05/21/2022 16:34:41 -------------------------------------------------------------------------------- Encounter Discharge Information Details Patient Name: Date of Service: Kathryn [name], [place] NNA L. 05/21/2022 3:30 PM Medical Record Number: [id number] Patient Account Number: [id number] Date of Birth/Sex: Treating RN: [birth year] (89 y.o. Kathryn [name] Primary Care Astryd Pearcy: [name] Other Clinician: Massie Kluver Referring Mekhai Venuto: Treating Karsyn Jamie/Extender: Armandina Gemma Elderton, Geradine Girt ([id number]) [id number] Page 3 of 9 Weeks in Treatment: 2 Encounter Discharge Information Items Post Procedure Vitals Discharge Condition: Stable Temperature (F): 97.7 Ambulatory Status: Wheelchair Pulse (bpm): 75 Discharge Destination: Home Respiratory Rate (breaths/min): 18 Transportation: Private Auto Blood Pressure (mmHg): 113/62 Accompanied By: son Schedule Follow-up Appointment: Yes Clinical Summary of Care: Electronic Signature(s) Signed: 05/22/2022 11:58:18 AM By: Massie Kluver Entered By: Massie Kluver on 05/21/2022 16:45:59 -------------------------------------------------------------------------------- Lower Extremity Assessment Details Patient Name: Date of Service: Kathryn [name], DEA NNA L. 05/21/2022 3:30 PM Medical Record Number: [id number] Patient Account Number: [id number] Date of Birth/Sex: Treating RN: Aug 10, 1933 (89 y.o. Kathryn [name] Primary Care  Evon Lopezperez: [name] Other Clinician: Massie Kluver Referring Caera Enwright: Treating Tadeusz Stahl/Extender: Jeri Cos [name], [name] in Treatment: 2 Edema Assessment Assessed: [Left: No] [Right: Yes] Edema: [Left: Ye] [Right: s] Calf Left: Right: Point of Measurement: 32 cm From Medial Instep 34.5 cm Ankle Left: Right: Point of Measurement: 10 cm From Medial Instep 21.7 cm Vascular Assessment Pulses: Dorsalis Pedis Palpable: [Right:Yes] Electronic Signature(s) Signed: 05/21/2022 6:05:58 PM By: Gretta Cool, BSN, RN, CWS, Kim RN, BSN Signed: 05/22/2022 11:58:18 AM By: Massie Kluver Entered By: Massie Kluver on 05/21/2022 15:55:53 Runions, Lariyah L ([id number].pdf Page 4 of 9 -------------------------------------------------------------------------------- Multi Wound Chart Details Patient Name: Date of Service: Kathryn [name] NNA L. 05/21/2022 3:30 PM Medical Record Number: [id number] Patient Account Number: [id number] Date of Birth/Sex: Treating RN: 1933/11/12 (89 y.o. Kathryn [name] Primary Care Kristan Brummitt: [name] Other Clinician: Massie Kluver Referring Amoree Newlon: Treating Daxx Tiggs/Extender: Jeri Cos [name], [name] in Treatment: 2 Vital Signs Height(in): 64 Pulse(bpm): 75 Weight(lbs): 180 Blood Pressure(mmHg): 113/62 Body Mass Index(BMI): 30.9 Temperature(F): 97.7 Respiratory Rate(breaths/min): 16 [9:Photos:] [N/A:N/A] Right T Second oe N/A N/A Wound Location: Gradually Appeared N/A N/A Wounding Event: Diabetic Wound/Ulcer of the Lower N/A N/A Primary Etiology: Extremity Chronic Obstructive Pulmonary N/A N/A Comorbid History: Disease (COPD), Congestive Heart Failure, Coronary Artery Disease, Hypertension, Peripheral Arterial Disease, Peripheral Venous Disease, Type II Diabetes, Rheumatoid Arthritis, Neuropathy 01/24/2022 N/A N/A Date Acquired: 2 N/A N/A Weeks of  Treatment: Open N/A N/A Wound Status: No N/A N/A Wound Recurrence: 0.5x0.6x0.2 N/A N/A Measurements L x W x D (cm) 0.236 N/A N/A A (cm) : rea 0.047 N/A N/A Volume (cm) : -87.30% N/A N/A % Reduction in A rea: -23.70% N/A N/A % Reduction in Volume: Grade 2 N/A N/A Classification: Medium N/A N/A Exudate A mount: Serosanguineous N/A N/A Exudate Type: red, brown N/A N/A Exudate Color: None Present (0%) N/A N/A Granulation A mount: Large (67-100%) N/A N/A Necrotic A mount: Fat Layer (Subcutaneous Tissue): Yes N/A N/A Exposed Structures: Fascia: No Tendon: No Muscle: No Joint: No Bone: No None N/A N/A Epithelialization: Treatment Notes Electronic Signature(s) Signed: 05/22/2022 11:58:18 AM By: Geralyn Corwin, Geradine Girt ([id number].pdf Page 5 of 9 Entered By: Massie Kluver on 05/21/2022 15:56:04 -------------------------------------------------------------------------------- Multi-Disciplinary Care Plan Details Patient  Name: Date of Service: Venita Sheffield 05/21/2022 3:30 PM Medical Record Number: [id number] Patient Account Number: [id number] Date of Birth/Sex: Treating RN: Mar 01, 1934 (89 y.o. Kathryn [name] Primary Care Joncarlos Atkison: [name] Other Clinician: Massie Kluver Referring Abishai Viegas: Treating Chasyn Cinque/Extender: Armandina Gemma Weeks in Treatment: 2 Active Inactive Necrotic Tissue Nursing Diagnoses: Knowledge deficit related to management of necrotic/devitalized tissue Goals: Necrotic/devitalized tissue will be minimized in the wound bed Date Initiated: 05/07/2022 Target Resolution Date: 06/05/2022 Goal Status: Active Interventions: Assess patient pain level pre-, during and post procedure and prior to discharge Provide education on necrotic tissue and debridement process Notes: Wound/Skin Impairment Nursing Diagnoses: Knowledge deficit related to ulceration/compromised  skin integrity Goals: Patient/caregiver will verbalize understanding of skin care regimen Date Initiated: 05/07/2022 Date Inactivated: 05/14/2022 Target Resolution Date: 06/05/2022 Goal Status: Met Ulcer/skin breakdown will have a volume reduction of 30% by week 4 Date Initiated: 05/07/2022 Target Resolution Date: 06/05/2022 Goal Status: Active Ulcer/skin breakdown will have a volume reduction of 50% by week 8 Date Initiated: 05/07/2022 Target Resolution Date: 07/06/2022 Goal Status: Active Ulcer/skin breakdown will have a volume reduction of 80% by week 12 Date Initiated: 05/07/2022 Target Resolution Date: 08/05/2022 Goal Status: Active Ulcer/skin breakdown will heal within 14 weeks Date Initiated: 05/07/2022 Target Resolution Date: 09/05/2022 Goal Status: Active Interventions: Assess patient/caregiver ability to obtain necessary supplies Assess patient/caregiver ability to perform ulcer/skin care regimen upon admission and as needed Assess ulceration(s) every visit Notes: Electronic Signature(s) Signed: 05/21/2022 6:05:58 PM By: Gretta Cool, BSN, RN, CWS, Kim RN, BSN Signed: 05/22/2022 11:58:18 AM By: Geralyn Corwin, Geradine Girt ([id number]) AM By: Birder Robson.pdf Page 6 of 9 Signed: 05/22/2022 11:58:18 Entered By: Massie Kluver on 05/21/2022 16:34:52 -------------------------------------------------------------------------------- Pain Assessment Details Patient Name: Date of Service: Kathryn [name] NNA L. 05/21/2022 3:30 PM Medical Record Number: [id number] Patient Account Number: [id number] Date of Birth/Sex: Treating RN: [birth year] (89 y.o. Kathryn [name] Primary Care Averiana Clouatre: [name] Other Clinician: Massie Kluver Referring Abbigail Anstey: Treating Myrla Malanowski/Extender: Jeri Cos [name], [name] in Treatment: 2 Active Problems Location of Pain Severity and Description of Pain Patient Has Paino Yes Site Locations Pain  Location: Pain in Ulcers Duration of the Pain. Constant / Intermittento Intermittent Rate the pain. Current Pain Level: 1 Character of Pain Describe the Pain: Aching, Burning, Other: stinging Pain Management and Medication Current Pain Management: Medication: No Cold Application: No Rest: Yes Massage: No Activity: No T.E.N.S.: No Heat Application: No Leg drop or elevation: No Is the Current Pain Management Adequate: Inadequate How does your wound impact your activities of daily livingo Sleep: No Bathing: No Appetite: No Relationship With Others: No Bladder Continence: No Emotions: No Bowel Continence: No Work: No Toileting: No Drive: No Dressing: No Hobbies: No Engineer, maintenance) Signed: 05/21/2022 6:05:58 PM By: Gretta Cool, BSN, RN, CWS, Kim RN, BSN Signed: 05/22/2022 11:58:18 AM By: Massie Kluver Entered By: Massie Kluver on 05/21/2022 15:47:49 Liles, Brittiny L ([id number] Page 7 of 9 -------------------------------------------------------------------------------- Patient/Caregiver Education Details Patient Name: Date of Service: Venita Sheffield 2/26/2024andnbsp3:30 PM Medical Record Number: [id number] Patient Account Number: [id number] Date of Birth/Gender: Treating RN: [birth year] (89 y.o. Kathryn [name] Primary Care Physician: [name] Other Clinician: Massie Kluver Referring Physician: Treating Physician/Extender: Lance Coon in Treatment: 2 Education Assessment Education Provided To: Patient Education Topics Provided Wound/Skin Impairment: Handouts: Other: continue wound care as directed Methods: Explain/Verbal Responses: State content correctly Electronic Signature(s) Signed: 05/22/2022 11:58:18 AM By: Massie Kluver Entered  By: Massie Kluver on 05/21/2022 16:35:21 -------------------------------------------------------------------------------- Wound  Assessment Details Patient Name: Date of Service: Kathryn [name] NNA L. 05/21/2022 3:30 PM Medical Record Number: [id number] Patient Account Number: [id number] Date of Birth/Sex: Treating RN: 1933-10-12 (89 y.o. Kathryn [name] Primary Care Misha Antonini: [name] Other Clinician: Massie Kluver Referring Julena Barbour: Treating Francely Craw/Extender: Jeri Cos [name], [name] in Treatment: 2 Wound Status Wound Number: 9 Primary Diabetic Wound/Ulcer of the Lower Extremity Etiology: Wound Location: Right T Second oe Wound Open Wounding Event: Gradually Appeared Status: Date Acquired: 01/24/2022 Comorbid Chronic Obstructive Pulmonary Disease (COPD), Congestive Heart Weeks Of Treatment: 2 History: Failure, Coronary Artery Disease, Hypertension, Peripheral Arterial Clustered Wound: No Disease, Peripheral Venous Disease, Type II Diabetes, Rheumatoid Arthritis, Neuropathy Photos ALAURA, VANNOSTRAND L ([id number] Page 8 of 9 Wound Measurements Length: (cm) 0.5 Width: (cm) 0.6 Depth: (cm) 0.2 Area: (cm) 0.236 Volume: (cm) 0.047 % Reduction in Area: -87.3% % Reduction in Volume: -23.7% Epithelialization: None Wound Description Classification: Grade 2 Exudate Amount: Medium Exudate Type: Serosanguineous Exudate Color: red, brown Foul Odor After Cleansing: No Slough/Fibrino Yes Wound Bed Granulation Amount: None Present (0%) Exposed Structure Necrotic Amount: Large (67-100%) Fascia Exposed: No Necrotic Quality: Adherent Slough Fat Layer (Subcutaneous Tissue) Exposed: Yes Tendon Exposed: No Muscle Exposed: No Joint Exposed: No Bone Exposed: No Treatment Notes Wound #9 (Toe Second) Wound Laterality: Right Cleanser Soap and Water Discharge Instruction: Gently cleanse wound with antibacterial soap, rinse and pat dry prior to dressing wounds Peri-Wound Care Topical Primary Dressing IODOFLEX 0.9% Cadexomer Iodine  Pad Discharge Instruction: Apply Iodoflex to wound bed only as directed. Secondary Dressing Coverlet Latex-Free Fabric Adhesive Dressings Discharge Instruction: 1.5 x 2 Secured With Compression Wrap Compression Stockings Add-Ons Electronic Signature(s) Signed: 05/21/2022 6:05:58 PM By: Gretta Cool, BSN, RN, CWS, Kim RN, BSN Signed: 05/22/2022 11:58:18 AM By: Massie Kluver Entered By: Massie Kluver on 05/21/2022 15:54:31 Salemi, Geradine Girt ([id number].pdf Page 9 of 9 -------------------------------------------------------------------------------- Vitals Details Patient Name: Date of Service: Kathryn [name] NNA L. 05/21/2022 3:30 PM Medical Record Number: [id number] Patient Account Number: [id number] Date of Birth/Sex: Treating RN: 30-Nov-1933 (89 y.o. Kathryn [name] Primary Care Yakelin Grenier: [name] Other Clinician: Massie Kluver Referring Dionicio Shelnutt: Treating Wonder Donaway/Extender: Jeri Cos [name], [name] in Treatment: 2 Vital Signs Time Taken: 15:45 Temperature (F): 97.7 Height (in): 64 Pulse (bpm): 75 Weight (lbs): 180 Respiratory Rate (breaths/min): 16 Body Mass Index (BMI): 30.9 Blood Pressure (mmHg): 113/62 Reference Range: 80 - 120 mg / dl Electronic Signature(s) Signed: 05/22/2022 11:58:18 AM By: Massie Kluver Entered By: Massie Kluver on 05/21/2022 15:47:42

## 2022-05-24 ENCOUNTER — Ambulatory Visit
Admission: RE | Admit: 2022-05-24 | Discharge: 2022-05-24 | Disposition: A | Discharge status: Home / Self Care | Payer: Medicare HMO | Source: Ambulatory Visit | Attending: Family | Admitting: Family

## 2022-05-24 DIAGNOSIS — R0602 Shortness of breath: Secondary | ICD-10-CM | POA: Diagnosis not present

## 2022-05-24 DIAGNOSIS — I5022 Chronic systolic (congestive) heart failure: Secondary | ICD-10-CM | POA: Diagnosis present

## 2022-05-24 DIAGNOSIS — I110 Hypertensive heart disease with heart failure: Secondary | ICD-10-CM | POA: Diagnosis not present

## 2022-05-24 DIAGNOSIS — I340 Nonrheumatic mitral (valve) insufficiency: Secondary | ICD-10-CM | POA: Diagnosis not present

## 2022-05-24 LAB — ECHOCARDIOGRAM COMPLETE
AR max vel: 2.41 cm2
AV Area VTI: 2.92 cm2
AV Area mean vel: 2.55 cm2
AV Mean grad: 2 mmHg
AV Peak grad: 4 mmHg
Ao pk vel: 1 m/s
Area-P 1/2: 6.54 cm2
S' Lateral: 3 cm

## 2022-05-24 NOTE — Progress Notes (Signed)
*  PRELIMINARY RESULTS* Echocardiogram 2D Echocardiogram has been performed.  Sherrie Sport 05/24/2022, 11:19 AM

## 2022-05-28 ENCOUNTER — Other Ambulatory Visit: Payer: Self-pay

## 2022-05-28 ENCOUNTER — Encounter: Payer: Medicare HMO | Attending: Internal Medicine | Admitting: Internal Medicine

## 2022-05-28 DIAGNOSIS — M6281 Muscle weakness (generalized): Secondary | ICD-10-CM | POA: Insufficient documentation

## 2022-05-28 DIAGNOSIS — I5042 Chronic combined systolic (congestive) and diastolic (congestive) heart failure: Secondary | ICD-10-CM | POA: Diagnosis not present

## 2022-05-28 DIAGNOSIS — L97512 Non-pressure chronic ulcer of other part of right foot with fat layer exposed: Secondary | ICD-10-CM | POA: Diagnosis not present

## 2022-05-28 DIAGNOSIS — E1151 Type 2 diabetes mellitus with diabetic peripheral angiopathy without gangrene: Secondary | ICD-10-CM | POA: Insufficient documentation

## 2022-05-28 DIAGNOSIS — I110 Hypertensive heart disease with heart failure: Secondary | ICD-10-CM | POA: Insufficient documentation

## 2022-05-28 DIAGNOSIS — E119 Type 2 diabetes mellitus without complications: Secondary | ICD-10-CM

## 2022-05-28 DIAGNOSIS — E11621 Type 2 diabetes mellitus with foot ulcer: Secondary | ICD-10-CM | POA: Insufficient documentation

## 2022-05-28 DIAGNOSIS — J441 Chronic obstructive pulmonary disease with (acute) exacerbation: Secondary | ICD-10-CM

## 2022-05-29 ENCOUNTER — Ambulatory Visit: Payer: Medicare HMO | Admitting: Internal Medicine

## 2022-06-01 NOTE — Progress Notes (Signed)
Kathryn [name], Kathryn [name] ([id number]) [id number] Page 1 of 6 Visit Report for 05/28/2022 HPI Details Patient Name: Date of Service: Kathryn [name], [place] NNA L. 05/28/2022 8:15 A M Medical Record Number: [id number] Patient Account Number: [id number] Date of Birth/Sex: Treating RN: 23-Mar-1934 (89 y.o. Kathryn [name] Primary Care Provider: Eulis [name] Other Clinician: Massie [name] Referring Provider: Treating Provider/Extender: Kathryn [name], Kathryn [name], Kathryn [name] in Treatment: 3 History of Present Illness HPI Description: 02-06-2022 upon evaluation today patient presents for initial inspection here in our clinic concerning wounds that she has over the bilateral feet as well as the right leg. She has been tolerating the dressing changes in general here without complication she has been putting antibiotic ointment on this. With that being said I do believe that she would benefit from the use of something like Xeroform gauze dressings which would likely be a bit better. Fortunately I do not see any signs of infection which is good news do not think she needs any antibiotics. She tells me that these wounds typically occur as a result of her bumping or hitting her leg and then they slowly progressed from there. Patient does have a history of diabetes for which her hemoglobin A1c was 6.9 measured on 12-12-2021. She also has a history of arterial insufficiency with a right ABI of 0.66 with a TBI of 0.61 and a left ABI of 0.82 with a TBI of 0.45. She has a follow-up evaluation on 02-27-2022 with vascular. This testing was done on 01-28-2022. Subsequently the patient also has a history of hypertension, generalized weakness, congestive heart failure, and lower extremity edema. 02-13-2022 upon evaluation today patient appears to be doing well currently in regard to her wounds. She is actually making some good progress here which is great news and overall I am extremely  pleased with where we stand. I do not see any evidence of worsening. 02-22-2022 upon evaluation today patient's wounds actually are showing some signs of improvement for the most part. Fortunately there does not appear to be any evidence of infection locally nor systemically at this point which is great news. No fevers, chills, nausea, vomiting, or diarrhea. She does have an a vascular appointment on December 5 and we will see what they have to say at that point. 03-08-2022 upon evaluation today patient actually appears to be doing excellent in regards to her wounds. She has been tolerating the dressing changes without complication. Fortunately there is no sign of active infection locally nor systemically at this point which is great news. Readmission: 05-07-2022 upon evaluation today patient presents for reevaluation here in the clinic it has been actually December 14 since I have last seen her. Subsequently she does still have a wound on the right second toe. This is the one area that has not healed everything on the left ended up healing. Fortunately I do not see any signs of infection at this point and really nothing is changed in her medical history since the last evaluation. She just tells me that this wound is just being stubborn and does not want to heal. 05-14-2022 upon evaluation today patient appears to be doing poorly in regard to her toe ulcer. Unfortunately she is showing signs of this being a little worse than last week. I was hopeful that this would actually show some signs of improvement but were definitely not seeing that. I think we probably need to switch to a different dressing, recommend Iodosorb/Iodoflex as an option here for her. 05-21-2022 upon  evaluation today patient appears to be doing well currently in regard to her toe ulcer. I did review her culture as well as her x-ray both are negative at this point. There is no growth after 3 days. With that being said that she has been  exposed this actually is looking much better Is read as it was and overall much happier with where things stand currently. I do not see any signs of infection locally nor systemically at this point. 3/4; this is a patient with a punched out area on the dorsal aspect of her right second toe in the inner phalangeal joint area. She had a previous 1 that healed on the left second toe. She saw Dr. Delana [name] at [facility]. He did not feel she required any surgical procedure suggested venous. We have been using Iodoflex Electronic Signature(s) Signed: 05/28/2022 4:38:26 PM By: Linton Ham MD Entered By: Linton Ham on 05/28/2022 08:47:15 Kathryn [name] ([id number]) [id number] Page 2 of 6 -------------------------------------------------------------------------------- Physical Exam Details Patient Name: Date of Service: Kathryn [name] NNA L. 05/28/2022 8:15 A M Medical Record Number: [id number] Patient Account Number: [id number] Date of Birth/Sex: Treating RN: 04/01/1933 (89 y.o. Kathryn [name] Primary Care Provider: Eulis [name] Other Clinician: Massie [name] Referring Provider: Treating Provider/Extender: Kathryn [name], Kathryn [name], Kathryn [name] in Treatment: 3 Constitutional Sitting or standing Blood Pressure is within target range for patient.. Pulse regular and within target range for patient.Marland Kitchen Respirations regular, non-labored and within target range.. Temperature is normal and within the target range for the patient.Marland Kitchen appears in no distress. Notes Wound exam; small punched out area on the dorsal surface of the right second toe. Kind of pale and lifeless. It does not probe to bone does not look infected and does not have palpable bone surface. In spite of the reassurance I found myself concerned about this area. Her pulses were difficult to feel. Area is painful. Electronic Signature(s) Signed: 05/28/2022 4:38:26 PM By: Linton Ham MD Entered By: Linton Ham on 05/28/2022 08:48:58 -------------------------------------------------------------------------------- Physician Orders Details Patient Name: Date of Service: Kathryn [name], DEA NNA L. 05/28/2022 8:15 A M Medical Record Number: [id number] Patient Account Number: [id number] Date of Birth/Sex: Treating RN: 04-05-1933 (89 y.o. Kathryn [name] Primary Care Provider: Eulis [name] Other Clinician: Massie [name] Referring Provider: Treating Provider/Extender: Kathryn [name], Kathryn [name], Kathryn [name] in Treatment: 3 Verbal / Phone Orders: No Diagnosis Coding Follow-up Appointments Return Appointment in 1 week. Bathing/ Shower/ Hygiene May shower; gently cleanse wound with antibacterial soap, rinse and pat dry prior to dressing wounds Anesthetic (Use 'Patient Medications' Section for Anesthetic Order Entry) Lidocaine applied to wound bed Edema Control - Lymphedema / Segmental Compressive Device / Other Elevate, Exercise Daily and A void Standing for Long Periods of Time. Elevate legs to the level of the heart and pump ankles as often as possible Elevate leg(s) parallel to the floor when sitting. DO YOUR BEST to sleep in the bed at night. DO NOT sleep in your recliner. Long hours of sitting in a recliner leads to swelling of the legs and/or potential wounds on your backside. ALATHEA, TURNBAUGH ([id number]) [id number] Page 3 of 6 Wound Treatment Wound #9 - T Second oe Wound Laterality: Right Cleanser: Soap and Water 3 x Per Week/30 Days Discharge Instructions: Gently cleanse wound with antibacterial soap, rinse and pat dry prior to dressing wounds Prim Dressing: IODOFLEX 0.9% Cadexomer Iodine Pad 3 x Per Week/30 Days ary Discharge Instructions: Apply  Iodoflex to wound bed only as directed. Secondary Dressing: Coverlet Latex-Free Fabric Adhesive Dressings 3 x Per Week/30 Days Discharge Instructions: 1.5 x  2 Electronic Signature(s) Signed: 05/28/2022 4:38:26 PM By: Linton Ham MD Signed: 05/30/2022 4:49:21 PM By: Kathryn [name] Entered By: Kathryn [name] on 05/28/2022 08:40:54 -------------------------------------------------------------------------------- Problem List Details Patient Name: Date of Service: Kathryn [name], DEA NNA L. 05/28/2022 8:15 A M Medical Record Number: [id number] Patient Account Number: [id number] Date of Birth/Sex: Treating RN: Oct 25, 1933 (89 y.o. Kathryn [name] Primary Care Provider: Eulis [name] Other Clinician: Massie [name] Referring Provider: Treating Provider/Extender: Kathryn [name], Kathryn [name], Kathryn [name] in Treatment: 3 Active Problems ICD-10 Encounter Code Description Active Date MDM Diagnosis E11.621 Type 2 diabetes mellitus with foot ulcer 05/07/2022 No Yes I73.89 Other specified peripheral vascular diseases 05/07/2022 No Yes L97.512 Non-pressure chronic ulcer of other part of right foot with fat layer exposed 05/07/2022 No Yes M62.81 Muscle weakness (generalized) 05/07/2022 No Yes I50.42 Chronic combined systolic (congestive) and diastolic (congestive) heart failure 05/07/2022 No Yes I10 Essential (primary) hypertension 05/07/2022 No Yes Inactive Problems Resolved Problems DELOISE, BROSIUS L ([id number]) [id number] Page 4 of 6 Electronic Signature(s) Signed: 05/28/2022 4:38:26 PM By: Linton Ham MD Entered By: Linton Ham on 05/28/2022 08:43:41 -------------------------------------------------------------------------------- Progress Note Details Patient Name: Date of Service: Kathryn [name], DEA NNA L. 05/28/2022 8:15 A M Medical Record Number: [id number] Patient Account Number: [id number] Date of Birth/Sex: Treating RN: 19-Nov-1933 (89 y.o. Kathryn [name] Primary Care Provider: Eulis [name] Other Clinician: Massie [name] Referring Provider: Treating Provider/Extender: Kathryn [name], Kathryn EL  G [name], Kathryn [name] in Treatment: 3 Subjective History of Present Illness (HPI) 02-06-2022 upon evaluation today patient presents for initial inspection here in our clinic concerning wounds that she has over the bilateral feet as well as the right leg. She has been tolerating the dressing changes in general here without complication she has been putting antibiotic ointment on this. With that being said I do believe that she would benefit from the use of something like Xeroform gauze dressings which would likely be a bit better. Fortunately I do not see any signs of infection which is good news do not think she needs any antibiotics. She tells me that these wounds typically occur as a result of her bumping or hitting her leg and then they slowly progressed from there. Patient does have a history of diabetes for which her hemoglobin A1c was 6.9 measured on 12-12-2021. She also has a history of arterial insufficiency with a right ABI of 0.66 with a TBI of 0.61 and a left ABI of 0.82 with a TBI of 0.45. She has a follow-up evaluation on 02-27-2022 with vascular. This testing was done on 01-28-2022. Subsequently the patient also has a history of hypertension, generalized weakness, congestive heart failure, and lower extremity edema. 02-13-2022 upon evaluation today patient appears to be doing well currently in regard to her wounds. She is actually making some good progress here which is great news and overall I am extremely pleased with where we stand. I do not see any evidence of worsening. 02-22-2022 upon evaluation today patient's wounds actually are showing some signs of improvement for the most part. Fortunately there does not appear to be any evidence of infection locally nor systemically at this point which is great news. No fevers, chills, nausea, vomiting, or diarrhea. She does have an a vascular appointment on December 5 and we will see what they have to say at that  point. 03-08-2022 upon  evaluation today patient actually appears to be doing excellent in regards to her wounds. She has been tolerating the dressing changes without complication. Fortunately there is no sign of active infection locally nor systemically at this point which is great news. Readmission: 05-07-2022 upon evaluation today patient presents for reevaluation here in the clinic it has been actually December 14 since I have last seen her. Subsequently she does still have a wound on the right second toe. This is the one area that has not healed everything on the left ended up healing. Fortunately I do not see any signs of infection at this point and really nothing is changed in her medical history since the last evaluation. She just tells me that this wound is just being stubborn and does not want to heal. 05-14-2022 upon evaluation today patient appears to be doing poorly in regard to her toe ulcer. Unfortunately she is showing signs of this being a little worse than last week. I was hopeful that this would actually show some signs of improvement but were definitely not seeing that. I think we probably need to switch to a different dressing, recommend Iodosorb/Iodoflex as an option here for her. 05-21-2022 upon evaluation today patient appears to be doing well currently in regard to her toe ulcer. I did review her culture as well as her x-ray both are negative at this point. There is no growth after 3 days. With that being said that she has been exposed this actually is looking much better Is read as it was and overall much happier with where things stand currently. I do not see any signs of infection locally nor systemically at this point. 3/4; this is a patient with a punched out area on the dorsal aspect of her right second toe in the inner phalangeal joint area. She had a previous 1 that healed on the left second toe. She saw Dr. Delana [name] at [facility]. He did not feel she required any  surgical procedure suggested venous. We have been using Iodoflex Objective Constitutional Sitting or standing Blood Pressure is within target range for patient.. Pulse regular and within target range for patient.Marland Kitchen Respirations regular, non-labored and within target range.. Temperature is normal and within the target range for the patient.Marland Kitchen appears in no distress. ALYS, Kathryn [name] ([id number]) [id number] Page 5 of 6 Vitals Time Taken: 8:20 AM, Height: 64 in, Weight: 180 lbs, BMI: 30.9, Temperature: 98.0 F, Pulse: 72 bpm, Respiratory Rate: 18 breaths/min, Blood Pressure: 126/71 mmHg. General Notes: Wound exam; small punched out area on the dorsal surface of the right second toe. Kind of pale and lifeless. It does not probe to bone does not look infected and does not have palpable bone surface. In spite of the reassurance I found myself concerned about this area. Her pulses were difficult to feel. Area is painful. Integumentary (Hair, Skin) Wound #9 status is Open. Original cause of wound was Gradually Appeared. The date acquired was: 01/24/2022. The wound has been in treatment 3 [name] The wound is located on the Right T Second. The wound measures 0.5cm length x 0.6cm width x 0.2cm depth; 0.236cm^2 area and 0.047cm^3 volume. There is oe Fat Layer (Subcutaneous Tissue) exposed. There is a medium amount of serosanguineous drainage noted. There is no granulation within the wound bed. There is a large (67-100%) amount of necrotic tissue within the wound bed including Adherent Slough. Assessment Active Problems ICD-10 Type 2 diabetes mellitus with foot ulcer Other specified peripheral vascular diseases Non-pressure chronic ulcer  of other part of right foot with fat layer exposed Muscle weakness (generalized) Chronic combined systolic (congestive) and diastolic (congestive) heart failure Essential (primary) hypertension Plan Follow-up Appointments: Return Appointment  in 1 week. Bathing/ Shower/ Hygiene: May shower; gently cleanse wound with antibacterial soap, rinse and pat dry prior to dressing wounds Anesthetic (Use 'Patient Medications' Section for Anesthetic Order Entry): Lidocaine applied to wound bed Edema Control - Lymphedema / Segmental Compressive Device / Other: Elevate, Exercise Daily and Avoid Standing for Long Periods of Time. Elevate legs to the level of the heart and pump ankles as often as possible Elevate leg(s) parallel to the floor when sitting. DO YOUR BEST to sleep in the bed at night. DO NOT sleep in your recliner. Long hours of sitting in a recliner leads to swelling of the legs and/or potential wounds on your backside. WOUND #9: - T Second Wound Laterality: Right oe Cleanser: Soap and Water 3 x Per Week/30 Days Discharge Instructions: Gently cleanse wound with antibacterial soap, rinse and pat dry prior to dressing wounds Prim Dressing: IODOFLEX 0.9% Cadexomer Iodine Pad 3 x Per Week/30 Days ary Discharge Instructions: Apply Iodoflex to wound bed only as directed. Secondary Dressing: Coverlet Latex-Free Fabric Adhesive Dressings 3 x Per Week/30 Days Discharge Instructions: 1.5 x 2 1. Concerning area on the right dorsal second toe. I am going to continue the Iodoflex. 2. I would like to review Dr. Nino Parsley notes. It would appear at the bedside that there is perhaps microvascular disease 3. Comforting that she had a wound on the left second toe which managed to heal however Electronic Signature(s) Signed: 05/28/2022 4:38:26 PM By: Linton Ham MD Entered By: Linton Ham on 05/28/2022 08:50:16 SuperBill Details -------------------------------------------------------------------------------- Kathryn [name] ([id number]) [id number] Page 6 of 6 Patient Name: Date of Service: Kathryn [name] NNA Carlean Jews 05/28/2022 Medical Record Number: [id number] Patient Account Number: [id number] Date of Birth/Sex:  Treating RN: [birth year] (89 y.o. Kathryn [name] Primary Care Provider: Eulis [name] Other Clinician: Massie [name] Referring Provider: Treating Provider/Extender: Kathryn [name], Kathryn [name], Kathryn [name] in Treatment: 3 Diagnosis Coding ICD-10 Codes Code Description E11.621 Type 2 diabetes mellitus with foot ulcer I73.89 Other specified peripheral vascular diseases L97.512 Non-pressure chronic ulcer of other part of right foot with fat layer exposed M62.81 Muscle weakness (generalized) I50.42 Chronic combined systolic (congestive) and diastolic (congestive) heart failure I10 Essential (primary) hypertension Facility Procedures CPT4 Code Description Modifier Quantity [id number] - WOUND CARE VISIT-LEV 3 EST PT 1 Physician Procedures Quantity CPT4 Code Description Modifier [id number] - WC PHYS LEVEL 3 - EST PT 1 ICD-10 Diagnosis Description E11.621 Type 2 diabetes mellitus with foot ulcer I73.89 Other specified peripheral vascular diseases Electronic Signature(s) Signed: 05/28/2022 4:38:26 PM By: Linton Ham MD Entered By: Linton Ham on 05/28/2022 08:51:04

## 2022-06-01 NOTE — Progress Notes (Signed)
CHEISEA, MURRAH ([id number]) [id number] Page 1 of 9 Visit Report for 05/28/2022 Arrival Information Details Patient Name: Date of Service: Kathryn [name], [place] NNA L. 05/28/2022 8:15 A M Medical Record Number: [id number] Patient Account Number: [id number] Date of Birth/Sex: Treating RN: 1934-01-28 (89 y.o. [name] Primary Care Addam Goeller: [name] Other Clinician: Massie Kluver Referring Najiyah Paris: Treating Donyae Kilner/Extender: RO BSO N, MICHA EL G [name], [name] in Treatment: 3 Visit Information History Since Last Visit All ordered tests and consults were completed: No Patient Arrived: Wheel Chair Added or deleted any medications: No Arrival Time: 08:19 Any new allergies or adverse reactions: No Transfer Assistance: EasyPivot Patient Lift Had a fall or experienced change in No Patient Identification Verified: Yes activities of daily living that may affect Secondary Verification Process Completed: Yes risk of falls: Patient Requires Transmission-Based Precautions: No Signs or symptoms of abuse/neglect since last visito No Patient Has Alerts: Yes Hospitalized since last visit: No Patient Alerts: ABI L .82 02/06/22 Implantable device outside of the clinic excluding No ABI R .66 02/06/22 cellular tissue based products placed in the center since last visit: Has Dressing in Place as Prescribed: Yes Pain Present Now: No Electronic Signature(s) Signed: 05/30/2022 4:49:21 PM By: Massie Kluver Entered By: Massie Kluver on 05/28/2022 08:20:27 -------------------------------------------------------------------------------- Clinic Level of Care Assessment Details Patient Name: Date of Service: Kathryn [name], DEA NNA L. 05/28/2022 8:15 A M Medical Record Number: [id number] Patient Account Number: [id number] Date of Birth/Sex: Treating RN: April 22, 1933 (89 y.o. [name] Primary Care Josey Forcier: [name] Other Clinician:  Massie Kluver Referring Yadira Hada: Treating Domingos Riggi/Extender: RO BSO N, MICHA EL G [name], [name] in Treatment: 3 Clinic Level of Care Assessment Items TOOL 4 Quantity Score '[]'$  - 0 Use when only an EandM is performed on FOLLOW-UP visit ASSESSMENTS - Nursing Assessment / Reassessment X- 1 10 Reassessment of Co-morbidities (includes updates in patient status) X- 1 5 Reassessment of Adherence to Treatment Plan MICAHYA, DOMBROSKY L ([id number]) [id number] Page 2 of 9 ASSESSMENTS - Wound and Skin A ssessment / Reassessment X - Simple Wound Assessment / Reassessment - one wound 1 5 '[]'$  - 0 Complex Wound Assessment / Reassessment - multiple wounds '[]'$  - 0 Dermatologic / Skin Assessment (not related to wound area) ASSESSMENTS - Focused Assessment '[]'$  - 0 Circumferential Edema Measurements - multi extremities '[]'$  - 0 Nutritional Assessment / Counseling / Intervention '[]'$  - 0 Lower Extremity Assessment (monofilament, tuning fork, pulses) '[]'$  - 0 Peripheral Arterial Disease Assessment (using hand held doppler) ASSESSMENTS - Ostomy and/or Continence Assessment and Care '[]'$  - 0 Incontinence Assessment and Management '[]'$  - 0 Ostomy Care Assessment and Management (repouching, etc.) PROCESS - Coordination of Care X - Simple Patient / Family Education for ongoing care 1 15 '[]'$  - 0 Complex (extensive) Patient / Family Education for ongoing care '[]'$  - 0 Staff obtains Programmer, systems, Records, T Results / Process Orders est '[]'$  - 0 Staff telephones HHA, Nursing Homes / Clarify orders / etc '[]'$  - 0 Routine Transfer to another Facility (non-emergent condition) '[]'$  - 0 Routine Hospital Admission (non-emergent condition) '[]'$  - 0 New Admissions / Biomedical engineer / Ordering NPWT Apligraf, etc. , '[]'$  - 0 Emergency Hospital Admission (emergent condition) X- 1 10 Simple Discharge Coordination '[]'$  - 0 Complex (extensive) Discharge Coordination PROCESS - Special  Needs '[]'$  - 0 Pediatric / Minor Patient Management '[]'$  - 0 Isolation Patient Management '[]'$  - 0 Hearing / Language / Visual special needs '[]'$  - 0 Assessment of Community assistance (transportation,  D/C planning, etc.) '[]'$  - 0 Additional assistance / Altered mentation '[]'$  - 0 Support Surface(s) Assessment (bed, cushion, seat, etc.) INTERVENTIONS - Wound Cleansing / Measurement X - Simple Wound Cleansing - one wound 1 5 '[]'$  - 0 Complex Wound Cleansing - multiple wounds X- 1 5 Wound Imaging (photographs - any number of wounds) '[]'$  - 0 Wound Tracing (instead of photographs) X- 1 5 Simple Wound Measurement - one wound '[]'$  - 0 Complex Wound Measurement - multiple wounds INTERVENTIONS - Wound Dressings '[]'$  - 0 Small Wound Dressing one or multiple wounds X- 1 15 Medium Wound Dressing one or multiple wounds '[]'$  - 0 Large Wound Dressing one or multiple wounds '[]'$  - 0 Application of Medications - topical '[]'$  - 0 Application of Medications - injection INTERVENTIONS - Miscellaneous '[]'$  - 0 External ear exam WENDOLYN, FAGA L ([id number]) [id number] Page 3 of 9 '[]'$  - 0 Specimen Collection (cultures, biopsies, blood, body fluids, etc.) '[]'$  - 0 Specimen(s) / Culture(s) sent or taken to Lab for analysis '[]'$  - 0 Patient Transfer (multiple staff / Harrel Lemon Lift / Similar devices) '[]'$  - 0 Simple Staple / Suture removal (25 or less) '[]'$  - 0 Complex Staple / Suture removal (26 or more) '[]'$  - 0 Hypo / Hyperglycemic Management (close monitor of Blood Glucose) '[]'$  - 0 Ankle / Brachial Index (ABI) - do not check if billed separately X- 1 5 Vital Signs Has the patient been seen at the hospital within the last three years: Yes Total Score: 80 Level Of Care: New/Established - Level 3 Electronic Signature(s) Signed: 05/30/2022 4:49:21 PM By: Massie Kluver Entered By: Massie Kluver on 05/28/2022  08:41:27 -------------------------------------------------------------------------------- Encounter Discharge Information Details Patient Name: Date of Service: Kathryn [name], DEA NNA L. 05/28/2022 8:15 A M Medical Record Number: [id number] Patient Account Number: [id number] Date of Birth/Sex: Treating RN: 09/20/1933 (89 y.o. [name] Primary Care Jawaan Adachi: [name] Other Clinician: Massie Kluver Referring Kendre Sires: Treating Leticia Coletta/Extender: RO BSO N, MICHA EL G [name], [name] in Treatment: 3 Encounter Discharge Information Items Discharge Condition: Stable Ambulatory Status: Wheelchair Discharge Destination: Home Transportation: Private Auto Accompanied By: son Schedule Follow-up Appointment: Yes Clinical Summary of Care: Electronic Signature(s) Signed: 05/30/2022 4:49:21 PM By: Massie Kluver Entered By: Massie Kluver on 05/28/2022 08:49:43 Lower Extremity Assessment Details -------------------------------------------------------------------------------- Thom Chimes ([id number]) [id number] Page 4 of 9 Patient Name: Date of Service: Kathryn [name], [place] NNA L. 05/28/2022 8:15 A M Medical Record Number: [id number] Patient Account Number: [id number] Date of Birth/Sex: Treating RN: Jan 07, 1934 (89 y.o. [name] Primary Care Jaedin Regina: [name] Other Clinician: Massie Kluver Referring Vishruth Seoane: Treating Maurie Musco/Extender: RO BSO N, MICHA EL G [name], [name] in Treatment: 3 Edema Assessment Left: Right: Assessed: No Yes Edema: Yes Calf Left: Right: Point of Measurement: 32 cm From Medial Instep 34 cm Ankle Left: Right: Point of Measurement: 10 cm From Medial Instep Vascular Assessment Left: Right: Pulses: Dorsalis Pedis Palpable: Yes Electronic Signature(s) Signed: 05/30/2022 4:49:21 PM By: Massie Kluver Signed: 05/30/2022 9:49:41 PM By: Gretta Cool, BSN, RN, CWS, Kim RN, BSN Entered  By: Massie Kluver on 05/28/2022 08:29:07 -------------------------------------------------------------------------------- Multi Wound Chart Details Patient Name: Date of Service: Kathryn [name], DEA NNA L. 05/28/2022 8:15 A M Medical Record Number: [id number] Patient Account Number: [id number] Date of Birth/Sex: Treating RN: Jan 09, 1934 (89 y.o. [name] Primary Care Haward Pope: [name] Other Clinician: Massie Kluver Referring Tavis Kring: Treating Keryl Gholson/Extender: RO BSO N, MICHA EL G [name], [name] in Treatment: 3 Vital Signs Height(in): 64 Pulse(bpm):  72 Weight(lbs): 180 Blood Pressure(mmHg): 126/71 Body Mass Index(BMI): 30.9 Temperature(F): 98.0 Respiratory Rate(breaths/min): 18 [9:Photos:] [N/A:N/A] Right T Second oe N/A N/A Wound Location: ALEIZA, HAMMITT ([id number]) [id number] Page 5 of 9 Gradually Appeared N/A N/A Wounding Event: Diabetic Wound/Ulcer of the Lower N/A N/A Primary Etiology: Extremity Chronic Obstructive Pulmonary N/A N/A Comorbid History: Disease (COPD), Congestive Heart Failure, Coronary Artery Disease, Hypertension, Peripheral Arterial Disease, Peripheral Venous Disease, Type II Diabetes, Rheumatoid Arthritis, Neuropathy 01/24/2022 N/A N/A Date Acquired: 3 N/A N/A Weeks of Treatment: Open N/A N/A Wound Status: No N/A N/A Wound Recurrence: 0.5x0.6x0.2 N/A N/A Measurements L x W x D (cm) 0.236 N/A N/A A (cm) : rea 0.047 N/A N/A Volume (cm) : -87.30% N/A N/A % Reduction in A rea: -23.70% N/A N/A % Reduction in Volume: Grade 2 N/A N/A Classification: Medium N/A N/A Exudate A mount: Serosanguineous N/A N/A Exudate Type: red, brown N/A N/A Exudate Color: None Present (0%) N/A N/A Granulation A mount: Large (67-100%) N/A N/A Necrotic A mount: Fat Layer (Subcutaneous Tissue): Yes N/A N/A Exposed Structures: Fascia: No Tendon: No Muscle: No Joint: No Bone: No None  N/A N/A Epithelialization: Treatment Notes Electronic Signature(s) Signed: 05/30/2022 4:49:21 PM By: Massie Kluver Entered By: Massie Kluver on 05/28/2022 08:29:15 -------------------------------------------------------------------------------- Multi-Disciplinary Care Plan Details Patient Name: Date of Service: Kathryn [name], DEA NNA L. 05/28/2022 8:15 A M Medical Record Number: [id number] Patient Account Number: [id number] Date of Birth/Sex: Treating RN: [birth year] (89 y.o. [name] Primary Care Mykale Gandolfo: [name] Other Clinician: Massie Kluver Referring Aftyn Nott: Treating Daisa Stennis/Extender: RO BSO N, MICHA EL G [name], [name] in Treatment: 3 Active Inactive Necrotic Tissue Nursing Diagnoses: Knowledge deficit related to management of necrotic/devitalized tissue Goals: Necrotic/devitalized tissue will be minimized in the wound bed Date Initiated: 05/07/2022 Target Resolution Date: 06/05/2022 Goal Status: Active Interventions: Assess patient pain level pre-, during and post procedure and prior to discharge Provide education on necrotic tissue and debridement process TAIA, BACH L ([id number]) [id number] Page 6 of 9 Notes: Wound/Skin Impairment Nursing Diagnoses: Knowledge deficit related to ulceration/compromised skin integrity Goals: Patient/caregiver will verbalize understanding of skin care regimen Date Initiated: 05/07/2022 Date Inactivated: 05/14/2022 Target Resolution Date: 06/05/2022 Goal Status: Met Ulcer/skin breakdown will have a volume reduction of 30% by week 4 Date Initiated: 05/07/2022 Target Resolution Date: 06/05/2022 Goal Status: Active Ulcer/skin breakdown will have a volume reduction of 50% by week 8 Date Initiated: 05/07/2022 Target Resolution Date: 07/06/2022 Goal Status: Active Ulcer/skin breakdown will have a volume reduction of 80% by week 12 Date Initiated: 05/07/2022 Target Resolution Date:  08/05/2022 Goal Status: Active Ulcer/skin breakdown will heal within 14 weeks Date Initiated: 05/07/2022 Target Resolution Date: 09/05/2022 Goal Status: Active Interventions: Assess patient/caregiver ability to obtain necessary supplies Assess patient/caregiver ability to perform ulcer/skin care regimen upon admission and as needed Assess ulceration(s) every visit Notes: Electronic Signature(s) Signed: 05/30/2022 4:49:21 PM By: Massie Kluver Signed: 05/30/2022 9:49:41 PM By: Gretta Cool, BSN, RN, CWS, Kim RN, BSN Entered By: Massie Kluver on 05/28/2022 08:48:46 -------------------------------------------------------------------------------- Pain Assessment Details Patient Name: Date of Service: Kathryn [name], DEA NNA L. 05/28/2022 8:15 A M Medical Record Number: [id number] Patient Account Number: [id number] Date of Birth/Sex: Treating RN: [birth year] (89 y.o. [name] Primary Care Lenoir Facchini: [name] Other Clinician: Massie Kluver Referring Laith Antonelli: Treating Amardeep Beckers/Extender: RO BSO N, MICHA EL G [name], [name] in Treatment: 3 Active Problems Location of Pain Severity and Description of Pain Patient Has Paino No Site Locations OTILLIE, LOMELI L ([id number]) [id number] Page  7 of 9 Pain Management and Medication Current Pain Management: Electronic Signature(s) Signed: 05/30/2022 4:49:21 PM By: Massie Kluver Signed: 05/30/2022 9:49:41 PM By: Gretta Cool, BSN, RN, CWS, Kim RN, BSN Entered By: Massie Kluver on 05/28/2022 08:22:52 -------------------------------------------------------------------------------- Patient/Caregiver Education Details Patient Name: Date of Service: Kathryn [name], DEA NNA Carlean Jews 3/4/2024andnbsp8:15 [place] Record Number: [id number] Patient Account Number: [id number] Date of Birth/Gender: Treating RN: [birth year] (89 y.o. [name] Primary Care Physician: [name] Other Clinician: Massie Kluver Referring Physician: Treating Physician/Extender: RO BSO N, MICHA EL G [name], Danise Mina in Treatment: 3 Education Assessment Education Provided To: Patient Education Topics Provided Wound/Skin Impairment: Handouts: Other: continue wound care as directed Methods: Explain/Verbal Responses: State content correctly Electronic Signature(s) Signed: 05/30/2022 4:49:21 PM By: Massie Kluver Entered By: Massie Kluver on 05/28/2022 [place], Geradine Girt ([id number]) [id number] Page 8 of 9 -------------------------------------------------------------------------------- Wound Assessment Details Patient Name: Date of Service: [name] NNA L. 05/28/2022 8:15 A M Medical Record Number: [id number] Patient Account Number: [id number] Date of Birth/Sex: Treating RN: 03-31-1933 (89 y.o. [name] Primary Care Kharson Rasmusson: [name] Other Clinician: Massie Kluver Referring Norena Bratton: Treating Zamariyah Furukawa/Extender: RO BSO N, MICHA EL G [name], [name] in Treatment: 3 Wound Status Wound Number: 9 Primary Diabetic Wound/Ulcer of the Lower Extremity Etiology: Wound Location: Right T Second oe Wound Open Wounding Event: Gradually Appeared Status: Date Acquired: 01/24/2022 Comorbid Chronic Obstructive Pulmonary Disease (COPD), Congestive Heart Weeks Of Treatment: 3 History: Failure, Coronary Artery Disease, Hypertension, Peripheral Arterial Clustered Wound: No Disease, Peripheral Venous Disease, Type II Diabetes, Rheumatoid Arthritis, Neuropathy Photos Wound Measurements Length: (cm) 0.5 Width: (cm) 0.6 Depth: (cm) 0.2 Area: (cm) 0.236 Volume: (cm) 0.047 % Reduction in Area: -87.3% % Reduction in Volume: -23.7% Epithelialization: None Wound Description Classification: Grade 2 Exudate Amount: Medium Exudate Type: Serosanguineous Exudate Color: red, brown Foul Odor After Cleansing: No Slough/Fibrino  Yes Wound Bed Granulation Amount: None Present (0%) Exposed Structure Necrotic Amount: Large (67-100%) Fascia Exposed: No Necrotic Quality: Adherent Slough Fat Layer (Subcutaneous Tissue) Exposed: Yes Tendon Exposed: No Muscle Exposed: No Joint Exposed: No Bone Exposed: No Treatment Notes Wound #9 (Toe Second) Wound Laterality: Right Cleanser Soap and Water Discharge Instruction: Gently cleanse wound with antibacterial soap, rinse and pat dry prior to dressing wounds Schou, Sheilah L ([id number]) [id number] Page 9 of 9 Peri-Wound Care Topical Primary Dressing IODOFLEX 0.9% Cadexomer Iodine Pad Discharge Instruction: Apply Iodoflex to wound bed only as directed. Secondary Dressing Coverlet Latex-Free Fabric Adhesive Dressings Discharge Instruction: 1.5 x 2 Secured With Compression Wrap Compression Stockings Add-Ons Electronic Signature(s) Signed: 05/30/2022 4:49:21 PM By: Massie Kluver Signed: 05/30/2022 9:49:41 PM By: Gretta Cool, BSN, RN, CWS, Kim RN, BSN Entered By: Massie Kluver on 05/28/2022 08:27:42 -------------------------------------------------------------------------------- Vitals Details Patient Name: Date of Service: Kathryn [name], DEA NNA L. 05/28/2022 8:15 A M Medical Record Number: [id number] Patient Account Number: [id number] Date of Birth/Sex: Treating RN: [birth year] (89 y.o. [name] Primary Care Megean Fabio: [name] Other Clinician: Massie Kluver Referring Salem Lembke: Treating Makar Slatter/Extender: RO BSO N, MICHA EL G [name], [name] in Treatment: 3 Vital Signs Time Taken: 08:20 Temperature (F): 98.0 Height (in): 64 Pulse (bpm): 72 Weight (lbs): 180 Respiratory Rate (breaths/min): 18 Body Mass Index (BMI): 30.9 Blood Pressure (mmHg): 126/71 Reference Range: 80 - 120 mg / dl Electronic Signature(s) Signed: 05/30/2022 4:49:21 PM By: Massie Kluver Entered By: Massie Kluver on 05/28/2022 08:22:47

## 2022-06-05 ENCOUNTER — Encounter (HOSPITAL_BASED_OUTPATIENT_CLINIC_OR_DEPARTMENT_OTHER): Payer: Medicare HMO | Admitting: Internal Medicine

## 2022-06-05 ENCOUNTER — Encounter: Payer: Medicare HMO | Admitting: Family

## 2022-06-05 DIAGNOSIS — I5042 Chronic combined systolic (congestive) and diastolic (congestive) heart failure: Secondary | ICD-10-CM | POA: Diagnosis not present

## 2022-06-05 DIAGNOSIS — E11621 Type 2 diabetes mellitus with foot ulcer: Secondary | ICD-10-CM

## 2022-06-05 DIAGNOSIS — I7389 Other specified peripheral vascular diseases: Secondary | ICD-10-CM

## 2022-06-05 DIAGNOSIS — L97512 Non-pressure chronic ulcer of other part of right foot with fat layer exposed: Secondary | ICD-10-CM

## 2022-06-12 ENCOUNTER — Encounter: Payer: Medicare HMO | Admitting: Physician Assistant

## 2022-06-12 DIAGNOSIS — E11621 Type 2 diabetes mellitus with foot ulcer: Secondary | ICD-10-CM | POA: Diagnosis not present

## 2022-06-12 NOTE — Progress Notes (Signed)
ATHYNA, LOVATO ([id number]) [id number] Page 1 of 8 Visit Report for 06/05/2022 Chief Complaint Document Details Patient Name: Date of Service: Kathryn [name] [name] 06/05/2022 3:15 PM Medical Record Number: [id number] Patient Account Number: [id number] Date of Birth/Sex: Treating RN: Mar 22, 1934 (89 y.o. Kathryn [name] Primary Care Provider: [name] Other Clinician: Referring Provider: Treating Provider/Extender: Jeanmarie Hubert Weeks in Treatment: 4 Information Obtained from: Patient Chief Complaint Right second toe ulcer Electronic Signature(s) Signed: 06/05/2022 3:46:00 PM By: Kalman Shan DO Entered By: Kalman Shan on 06/05/2022 15:40:51 -------------------------------------------------------------------------------- HPI Details Patient Name: Date of Service: Kathryn [name], [name] 06/05/2022 3:15 PM Medical Record Number: [id number] Patient Account Number: [id number] Date of Birth/Sex: Treating RN: [birth year] (89 y.o. Kathryn [name] Primary Care Provider: [name] Other Clinician: Referring Provider: Treating Provider/Extender: Jeanmarie Hubert Weeks in Treatment: 4 History of Present Illness HPI Description: 02-06-2022 upon evaluation today patient presents for initial inspection here in our clinic concerning wounds that she has over the bilateral feet as well as the right leg. She has been tolerating the dressing changes in general here without complication she has been putting antibiotic ointment on this. With that being said I do believe that she would benefit from the use of something like Xeroform gauze dressings which would likely be a bit better. Fortunately I do not see any signs of infection which is good news do not think she needs any antibiotics. She tells me that these wounds typically occur as a result of her bumping or hitting her leg and then  they slowly progressed from there. Patient does have a history of diabetes for which her hemoglobin A1c was 6.9 measured on 12-12-2021. She also has a history of arterial insufficiency with a right ABI of 0.66 with a TBI of 0.61 and a left ABI of 0.82 with a TBI of 0.45. She has a follow-up evaluation on 02-27-2022 with vascular. This testing was done on 01-28-2022. Subsequently the patient also has a history of hypertension, generalized weakness, congestive heart failure, and lower extremity edema. 02-13-2022 upon evaluation today patient appears to be doing well currently in regard to her wounds. She is actually making some good progress here which is great news and overall I am extremely pleased with where we stand. I do not see any evidence of worsening. 02-22-2022 upon evaluation today patient's wounds actually are showing some signs of improvement for the most part. Fortunately there does not appear to be any evidence of infection locally nor systemically at this point which is great news. No fevers, chills, nausea, vomiting, or diarrhea. She does have an a vascular appointment on December 5 and we will see what they have to say at that point. Kathryn [name], Kathryn [name] ([id number]) [id number] Page 2 of 8 03-08-2022 upon evaluation today patient actually appears to be doing excellent in regards to her wounds. She has been tolerating the dressing changes without complication. Fortunately there is no sign of active infection locally nor systemically at this point which is great news. Readmission: 05-07-2022 upon evaluation today patient presents for reevaluation here in the clinic it has been actually December 14 since I have last seen her. Subsequently she does still have a wound on the right second toe. This is the one area that has not healed everything on the left ended up healing. Fortunately I do not see any signs of infection at this point and really nothing is changed in her  medical history since the last evaluation.  She just tells me that this wound is just being stubborn and does not want to heal. 05-14-2022 upon evaluation today patient appears to be doing poorly in regard to her toe ulcer. Unfortunately she is showing signs of this being a little worse than last week. I was hopeful that this would actually show some signs of improvement but were definitely not seeing that. I think we probably need to switch to a different dressing, recommend Iodosorb/Iodoflex as an option here for her. 05-21-2022 upon evaluation today patient appears to be doing well currently in regard to her toe ulcer. I did review her culture as well as her x-ray both are negative at this point. There is no growth after 3 days. With that being said that she has been exposed this actually is looking much better Is read as it was and overall much happier with where things stand currently. I do not see any signs of infection locally nor systemically at this point. 3/4; this is a patient with a punched out area on the dorsal aspect of her right second toe in the inner phalangeal joint area. She had a previous 1 that healed on the left second toe. She saw Dr. Delana [facility]. He did not feel she required any surgical procedure suggested venous. We have been using Iodoflex 3/12; patient presents for follow-up. She has been using Iodoflex to the second right toe. She has no issues or complaints today. Electronic Signature(s) Signed: 06/05/2022 3:46:00 PM By: Kalman Shan DO Entered By: Kalman Shan on 06/05/2022 15:41:13 -------------------------------------------------------------------------------- Physical Exam Details Patient Name: Date of Service: Kathryn [name], [name] 06/05/2022 3:15 PM Medical Record Number: [id number] Patient Account Number: [id number] Date of Birth/Sex: Treating RN: 03/07/1934 (89 y.o. Kathryn [name] Primary Care Provider: [name] Other  Clinician: Referring Provider: Treating Provider/Extender: Jeanmarie Hubert Weeks in Treatment: 4 Constitutional . Cardiovascular . Psychiatric . Notes Small punched out area on the dorsal surface of the right second toe. Pale and lifeless. It does not probe to bone. No signs of surrounding infection. Electronic Signature(s) Signed: 06/05/2022 3:46:00 PM By: Kalman Shan DO Entered By: Kalman Shan on 06/05/2022 15:44:14 Kathryn [name] ([id number]) [id number] Page 3 of 8 -------------------------------------------------------------------------------- Physician Orders Details Patient Name: Date of Service: Kathryn [name] [name] 06/05/2022 3:15 PM Medical Record Number: [id number] Patient Account Number: [id number] Date of Birth/Sex: Treating RN: 06-Jun-1933 (89 y.o. Kathryn [name] Primary Care Provider: [name] Other Clinician: Referring Provider: Treating Provider/Extender: Star Age in Treatment: 4 Verbal / Phone Orders: No Diagnosis Coding Follow-up Appointments Return Appointment in 1 week. Bathing/ Shower/ Hygiene May shower; gently cleanse wound with antibacterial soap, rinse and pat dry prior to dressing wounds Anesthetic (Use 'Patient Medications' Section for Anesthetic Order Entry) Lidocaine applied to wound bed Edema Control - Lymphedema / Segmental Compressive Device / Other Elevate, Exercise Daily and A void Standing for Long Periods of Time. Elevate legs to the level of the heart and pump ankles as often as possible Elevate leg(s) parallel to the floor when sitting. DO YOUR BEST to sleep in the bed at night. DO NOT sleep in your recliner. Long hours of sitting in a recliner leads to swelling of the legs and/or potential wounds on your backside. Wound Treatment Wound #9 - T Second oe Wound Laterality: Right Cleanser: Soap and Water 3 x Per Week/30  Days Discharge Instructions: Gently cleanse wound with antibacterial soap, rinse and pat dry prior to  dressing wounds Prim Dressing: IODOFLEX 0.9% Cadexomer Iodine Pad 3 x Per Week/30 Days ary Discharge Instructions: Apply Iodoflex to wound bed only as directed. Secondary Dressing: Coverlet Latex-Free Fabric Adhesive Dressings 3 x Per Week/30 Days Discharge Instructions: 1.5 x 2 Electronic Signature(s) Signed: 06/05/2022 3:46:00 PM By: Kalman Shan DO Entered By: Kalman Shan on 06/05/2022 15:45:29 -------------------------------------------------------------------------------- Problem List Details Patient Name: Date of Service: Kathryn [name], [name] 06/05/2022 3:15 PM Medical Record Number: [id number] Patient Account Number: [id number] Date of Birth/Sex: Treating RN: [birth year] (89 y.o. Kathryn [name] Primary Care Provider: [name] Other Clinician: MAKENZIE, Kathryn [name] ([id number]) [id number] Page 4 of 8 Referring Provider: Treating Provider/Extender: Jeanmarie Hubert Weeks in Treatment: 4 Active Problems ICD-10 Encounter Code Description Active Date MDM Diagnosis E11.621 Type 2 diabetes mellitus with foot ulcer 05/07/2022 No Yes I73.89 Other specified peripheral vascular diseases 05/07/2022 No Yes L97.512 Non-pressure chronic ulcer of other part of right foot with fat layer exposed 05/07/2022 No Yes M62.81 Muscle weakness (generalized) 05/07/2022 No Yes I50.42 Chronic combined systolic (congestive) and diastolic (congestive) heart failure 05/07/2022 No Yes I10 Essential (primary) hypertension 05/07/2022 No Yes Inactive Problems Resolved Problems Electronic Signature(s) Signed: 06/05/2022 3:46:00 PM By: Kalman Shan DO Entered By: Kalman Shan on 06/05/2022 15:40:46 -------------------------------------------------------------------------------- Progress Note Details Patient Name: Date of Service: Kathryn [name], [name] 06/05/2022 3:15 PM Medical Record Number: [id number] Patient Account Number: [id number] Date of Birth/Sex: Treating RN: Apr 01, 1933 (89 y.o. Kathryn [name] Primary Care Provider: [name] Other Clinician: Referring Provider: Treating Provider/Extender: Jeanmarie Hubert Weeks in Treatment: 4 Subjective Chief Complaint Information obtained from Patient Right second toe ulcer History of Present Illness (HPI) 02-06-2022 upon evaluation today patient presents for initial inspection here in our clinic concerning wounds that she has over the bilateral feet as well as the right leg. She has been tolerating the dressing changes in general here without complication she has been putting antibiotic ointment on this. With that being said I do believe that she would benefit from the use of something like Xeroform gauze dressings which would likely be a bit better. Fortunately I do not see Kathryn [name], Kathryn [name] ([id number]) [id number] Page 5 of 8 any signs of infection which is good news do not think she needs any antibiotics. She tells me that these wounds typically occur as a result of her bumping or hitting her leg and then they slowly progressed from there. Patient does have a history of diabetes for which her hemoglobin A1c was 6.9 measured on 12-12-2021. She also has a history of arterial insufficiency with a right ABI of 0.66 with a TBI of 0.61 and a left ABI of 0.82 with a TBI of 0.45. She has a follow-up evaluation on 02-27-2022 with vascular. This testing was done on 01-28-2022. Subsequently the patient also has a history of hypertension, generalized weakness, congestive heart failure, and lower extremity edema. 02-13-2022 upon evaluation today patient appears to be doing well currently in regard to her wounds. She is actually making some good progress here which is great news and overall I am extremely pleased with where we  stand. I do not see any evidence of worsening. 02-22-2022 upon evaluation today patient's wounds actually are showing some signs of improvement for the most part. Fortunately there does not appear to be any evidence of infection locally nor systemically at this point which is great news. No fevers, chills, nausea, vomiting, or diarrhea. She does have an a vascular appointment on December  5 and we will see what they have to say at that point. 03-08-2022 upon evaluation today patient actually appears to be doing excellent in regards to her wounds. She has been tolerating the dressing changes without complication. Fortunately there is no sign of active infection locally nor systemically at this point which is great news. Readmission: 05-07-2022 upon evaluation today patient presents for reevaluation here in the clinic it has been actually December 14 since I have last seen her. Subsequently she does still have a wound on the right second toe. This is the one area that has not healed everything on the left ended up healing. Fortunately I do not see any signs of infection at this point and really nothing is changed in her medical history since the last evaluation. She just tells me that this wound is just being stubborn and does not want to heal. 05-14-2022 upon evaluation today patient appears to be doing poorly in regard to her toe ulcer. Unfortunately she is showing signs of this being a little worse than last week. I was hopeful that this would actually show some signs of improvement but were definitely not seeing that. I think we probably need to switch to a different dressing, recommend Iodosorb/Iodoflex as an option here for her. 05-21-2022 upon evaluation today patient appears to be doing well currently in regard to her toe ulcer. I did review her culture as well as her x-ray both are negative at this point. There is no growth after 3 days. With that being said that she has been exposed this actually  is looking much better Is read as it was and overall much happier with where things stand currently. I do not see any signs of infection locally nor systemically at this point. 3/4; this is a patient with a punched out area on the dorsal aspect of her right second toe in the inner phalangeal joint area. She had a previous 1 that healed on the left second toe. She saw Dr. Delana Meyer at [facility]. He did not feel she required any surgical procedure suggested venous. We have been using Iodoflex 3/12; patient presents for follow-up. She has been using Iodoflex to the second right toe. She has no issues or complaints today. Objective Constitutional Vitals Time Taken: 3:20 PM, Height: 64 in, Weight: 180 lbs, BMI: 30.9, Temperature: 97.8 F, Pulse: 78 bpm, Respiratory Rate: 18 breaths/min, Blood Pressure: 120/66 mmHg. General Notes: Small punched out area on the dorsal surface of the right second toe. Pale and lifeless. It does not probe to bone. No signs of surrounding infection. Integumentary (Hair, Skin) Wound #9 status is Open. Original cause of wound was Gradually Appeared. The date acquired was: 01/24/2022. The wound has been in treatment 4 weeks. The wound is located on the Right T Second. The wound measures 0.4cm length x 0.5cm width x 0.2cm depth; 0.157cm^2 area and 0.031cm^3 volume. There is oe Fat Layer (Subcutaneous Tissue) exposed. There is no tunneling or undermining noted. There is a medium amount of serosanguineous drainage noted. There is no granulation within the wound bed. There is a large (67-100%) amount of necrotic tissue within the wound bed including Adherent Slough. Assessment Active Problems ICD-10 Type 2 diabetes mellitus with foot ulcer Other specified peripheral vascular diseases Non-pressure chronic ulcer of other part of right foot with fat layer exposed Muscle weakness (generalized) Chronic combined systolic (congestive) and diastolic (congestive) heart  failure Essential (primary) hypertension Patient's wound is stable. No signs of infection. I recommended continuing  the course with Iodoflex. Follow-up in 1 week. Plan Follow-up Appointments: Kathryn [name], Kathryn [name] ([id number]) [id number] Page 6 of 8 Return Appointment in 1 week. Bathing/ Shower/ Hygiene: May shower; gently cleanse wound with antibacterial soap, rinse and pat dry prior to dressing wounds Anesthetic (Use 'Patient Medications' Section for Anesthetic Order Entry): Lidocaine applied to wound bed Edema Control - Lymphedema / Segmental Compressive Device / Other: Elevate, Exercise Daily and Avoid Standing for Long Periods of Time. Elevate legs to the level of the heart and pump ankles as often as possible Elevate leg(s) parallel to the floor when sitting. DO YOUR BEST to sleep in the bed at night. DO NOT sleep in your recliner. Long hours of sitting in a recliner leads to swelling of the legs and/or potential wounds on your backside. WOUND #9: - T Second Wound Laterality: Right oe Cleanser: Soap and Water 3 x Per Week/30 Days Discharge Instructions: Gently cleanse wound with antibacterial soap, rinse and pat dry prior to dressing wounds Prim Dressing: IODOFLEX 0.9% Cadexomer Iodine Pad 3 x Per Week/30 Days ary Discharge Instructions: Apply Iodoflex to wound bed only as directed. Secondary Dressing: Coverlet Latex-Free Fabric Adhesive Dressings 3 x Per Week/30 Days Discharge Instructions: 1.5 x 2 1. Iodoflex 2. Follow-up in 1 week Electronic Signature(s) Signed: 06/05/2022 3:46:00 PM By: Kalman Shan DO Entered By: Kalman Shan on 06/05/2022 15:44:59 -------------------------------------------------------------------------------- ROS/PFSH Details Patient Name: Date of Service: Kathryn [name], [name] 06/05/2022 3:15 PM Medical Record Number: [id number] Patient Account Number: [id number] Date of Birth/Sex: Treating RN: May 08, 1933 (89 y.o. Kathryn [name] Primary Care Provider: [name] Other Clinician: Referring Provider: Treating Provider/Extender: Star Age in Treatment: 4 Information Obtained From Patient Respiratory Medical History: Positive for: Chronic Obstructive Pulmonary Disease (COPD) Cardiovascular Medical History: Positive for: Congestive Heart Failure; Coronary Artery Disease; Hypertension; Peripheral Arterial Disease; Peripheral Venous Disease Endocrine Medical History: Positive for: Type II Diabetes Time with diabetes: 5 Treated with: Oral agents Blood sugar tested every day: No Musculoskeletal Medical History: Positive for: Rheumatoid Arthritis Kathryn [name], Kathryn [name] ([id number]) [id number] Page 7 of 8 Neurologic Medical History: Positive for: Neuropathy Oncologic Medical History: Past Medical History Notes: SKkin Cancers Immunizations Pneumococcal Vaccine: Received Pneumococcal Vaccination: Yes Received Pneumococcal Vaccination On or After 60th Birthday: Yes Implantable Devices No devices added Family and Social History Former smoker; Marital Status - Widowed; Alcohol Use: Never; Caffeine Use: Daily Electronic Signature(s) Signed: 06/05/2022 3:46:00 PM By: Kalman Shan DO Signed: 06/11/2022 5:01:39 PM By: Carlene Coria RN Entered By: Kalman Shan on 06/05/2022 15:45:38 -------------------------------------------------------------------------------- SuperBill Details Patient Name: Date of Service: Kathryn [name], [name] 06/05/2022 Medical Record Number: [id number] Patient Account Number: [id number] Date of Birth/Sex: Treating RN: 10-13-1933 (89 y.o. Kathryn [name] Primary Care Provider: [name] Other Clinician: Referring Provider: Treating Provider/Extender: Jeanmarie Hubert Weeks in Treatment: 4 Diagnosis Coding ICD-10 Codes Code Description [phone] Type 2 diabetes  mellitus with foot ulcer I73.89 Other specified peripheral vascular diseases L97.512 Non-pressure chronic ulcer of other part of right foot with fat layer exposed M62.81 Muscle weakness (generalized) I50.42 Chronic combined systolic (congestive) and diastolic (congestive) heart failure I10 Essential (primary) hypertension Facility Procedures : CPT4 Code: [id number] Description: [id number] - WOUND CARE VISIT-LEV 2 EST PT Modifier: Quantity: 1 Physician Procedures : CPT4 Code Description Modifier [id number] - WC PHYS LEVEL 3 - EST PT ICD-10 Diagnosis Description E11.621 Type 2 diabetes mellitus with foot ulcer Kathryn [name], Kathryn [name] ([id number]) [id number] L97.512 Non-pressure chronic ulcer of  other  part of right foot with fat layer exposed I73.89 Other specified peripheral vascular diseases I50.42 Chronic combined systolic (congestive) and diastolic (congestive) heart failure Quantity: 1 [id number].pdf Page 8 of 8 Electronic Signature(s) Signed: 06/05/2022 3:46:00 PM By: Kalman Shan DO Entered By: Kalman Shan on 06/05/2022 15:45:21

## 2022-06-12 NOTE — Progress Notes (Signed)
LYNNET, STRANDBERG ([id number]) [id number] Page 1 of 9 Visit Report for 06/05/2022 Arrival Information Details Patient Name: Date of Service: Kathryn [name], [place] NNA L. 06/05/2022 3:15 PM Medical Record Number: [id number] Patient Account Number: [id number] Date of Birth/Sex: Treating RN: 1933-11-14 (89 y.o. Kathryn [name] Primary Care Rorey Bisson: [name] Other Clinician: Referring Bronson Bressman: Treating Kemyra August/Extender: Star Age in Treatment: 4 Visit Information History Since Last Visit Added or deleted any medications: No Patient Arrived: Wheel Chair Any new allergies or adverse reactions: No Arrival Time: 15:13 Had a fall or experienced change in No Accompanied By: son activities of daily living that may affect Transfer Assistance: None risk of falls: Patient Identification Verified: Yes Signs or symptoms of abuse/neglect since last visito No Secondary Verification Process Completed: Yes Hospitalized since last visit: No Patient Requires Transmission-Based Precautions: No Implantable device outside of the clinic excluding No Patient Has Alerts: Yes cellular tissue based products placed in the center Patient Alerts: ABI L .82 02/06/22 since last visit: ABI R .66 02/06/22 Has Dressing in Place as Prescribed: Yes Pain Present Now: No Electronic Signature(s) Signed: 06/11/2022 5:01:39 PM By: Carlene Coria RN Entered By: Carlene Coria on 06/05/2022 15:20:21 -------------------------------------------------------------------------------- Clinic Level of Care Assessment Details Patient Name: Date of Service: Kathryn [name] NNA L. 06/05/2022 3:15 PM Medical Record Number: [id number] Patient Account Number: [id number] Date of Birth/Sex: Treating RN: [birth year] (89 y.o. Kathryn [name] Primary Care [name]: [name] Other Clinician: Referring [name]: Treating [name]/Extender: Star Age in Treatment: 4 Clinic Level of Care Assessment Items TOOL 4 Quantity Score X- 1 0 Use when only an EandM is performed on FOLLOW-UP visit ASSESSMENTS - Nursing Assessment / Reassessment X- 1 10 Reassessment of Co-morbidities (includes updates in patient status) X- 1 5 Reassessment of Adherence to Treatment Plan MARETA, MERVIS L ([id number]) [id number] Page 2 of 9 ASSESSMENTS - Wound and Skin A ssessment / Reassessment X - Simple Wound Assessment / Reassessment - one wound 1 5 []  - 0 Complex Wound Assessment / Reassessment - multiple wounds []  - 0 Dermatologic / Skin Assessment (not related to wound area) ASSESSMENTS - Focused Assessment []  - 0 Circumferential Edema Measurements - multi extremities []  - 0 Nutritional Assessment / Counseling / Intervention []  - 0 Lower Extremity Assessment (monofilament, tuning fork, pulses) []  - 0 Peripheral Arterial Disease Assessment (using hand held doppler) ASSESSMENTS - Ostomy and/or Continence Assessment and Care []  - 0 Incontinence Assessment and Management []  - 0 Ostomy Care Assessment and Management (repouching, etc.) PROCESS - Coordination of Care X - Simple Patient / Family Education for ongoing care 1 15 []  - 0 Complex (extensive) Patient / Family Education for ongoing care []  - 0 Staff obtains Programmer, systems, Records, T Results / Process Orders est []  - 0 Staff telephones HHA, Nursing Homes / Clarify orders / etc []  - 0 Routine Transfer to another Facility (non-emergent condition) []  - 0 Routine Hospital Admission (non-emergent condition) []  - 0 New Admissions / Biomedical engineer / Ordering NPWT Apligraf, etc. , []  - 0 Emergency Hospital Admission (emergent condition) X- 1 10 Simple Discharge Coordination []  - 0 Complex (extensive) Discharge Coordination PROCESS - Special Needs []  - 0 Pediatric / Minor Patient Management []  - 0 Isolation Patient  Management []  - 0 Hearing / Language / Visual special needs []  - 0 Assessment of Community assistance (transportation, D/C planning, etc.) []  - 0 Additional assistance / Altered mentation []  - 0 Support Surface(s) Assessment (bed, cushion, seat,  etc.) INTERVENTIONS - Wound Cleansing / Measurement X - Simple Wound Cleansing - one wound 1 5 []  - 0 Complex Wound Cleansing - multiple wounds X- 1 5 Wound Imaging (photographs - any number of wounds) []  - 0 Wound Tracing (instead of photographs) []  - 0 Simple Wound Measurement - one wound []  - 0 Complex Wound Measurement - multiple wounds INTERVENTIONS - Wound Dressings X - Small Wound Dressing one or multiple wounds 1 10 []  - 0 Medium Wound Dressing one or multiple wounds []  - 0 Large Wound Dressing one or multiple wounds X- 1 5 Application of Medications - topical []  - 0 Application of Medications - injection INTERVENTIONS - Miscellaneous []  - 0 External ear exam HEMI, DEESE L ([id number] Page 3 of 9 []  - 0 Specimen Collection (cultures, biopsies, blood, body fluids, etc.) []  - 0 Specimen(s) / Culture(s) sent or taken to Lab for analysis []  - 0 Patient Transfer (multiple staff / Harrel Lemon Lift / Similar devices) []  - 0 Simple Staple / Suture removal (25 or less) []  - 0 Complex Staple / Suture removal (26 or more) []  - 0 Hypo / Hyperglycemic Management (close monitor of Blood Glucose) []  - 0 Ankle / Brachial Index (ABI) - do not check if billed separately X- 1 5 Vital Signs Has the patient been seen at the hospital within the last three years: Yes Total Score: 75 Level Of Care: New/Established - Level 2 Electronic Signature(s) Signed: 06/11/2022 5:01:39 PM By: Carlene Coria RN Entered By: Carlene Coria on 06/05/2022 15:38:23 -------------------------------------------------------------------------------- Encounter Discharge Information Details Patient Name: Date of Service: Kathryn [name], [place] NNA L. 06/05/2022 3:15 PM Medical Record Number: [id number] Patient Account Number: [id number] Date of Birth/Sex: Treating RN: [birth year] (89 y.o. Kathryn [name] Primary Care [name]: [name] Other Clinician: Referring Cassandra Mcmanaman: Treating Hansika Leaming/Extender: Star Age in Treatment: 4 Encounter Discharge Information Items Discharge Condition: Stable Ambulatory Status: Wheelchair Discharge Destination: Home Transportation: Private Auto Accompanied By: son Schedule Follow-up Appointment: Yes Clinical Summary of Care: Electronic Signature(s) Signed: 06/11/2022 5:01:39 PM By: Carlene Coria RN Entered By: Carlene Coria on 06/05/2022 15:39:35 -------------------------------------------------------------------------------- Lower Extremity Assessment Details Patient Name: Date of Service: Kathryn [name], [place] NNA L. 06/05/2022 3:15 PM Thom Chimes ([id number] Page 4 of 9 Medical Record Number: [id number] Patient Account Number: [id number] Date of Birth/Sex: Treating RN: 01/24/1934 (89 y.o. Kathryn [name] Primary Care Ndeye Tenorio: [name] Other Clinician: Referring Laurissa Cowper: Treating Kaegan Stigler/Extender: Jeanmarie Hubert Weeks in Treatment: 4 Edema Assessment Assessed: [Left: No] [Right: No] [Left: Edema] [Right: :] Calf Left: Right: Point of Measurement: 32 cm From Medial Instep 33 cm Ankle Left: Right: Point of Measurement: 10 cm From Medial Instep 22 cm Vascular Assessment Pulses: Dorsalis Pedis Palpable: [Right:Yes] Electronic Signature(s) Signed: 06/11/2022 5:01:39 PM By: Carlene Coria RN Entered By: Carlene Coria on 06/05/2022 15:26:22 -------------------------------------------------------------------------------- Multi Wound Chart Details Patient Name: Date of Service: Kathryn [name], DE[place] NNA L. 06/05/2022 3:15 PM Medical Record Number:  [id number] Patient Account Number: [id number] Date of Birth/Sex: Treating RN: 08/28/1933 (89 y.o. Kathryn [name] Primary Care Steffani Dionisio: [name] Other Clinician: Referring Pakou Rainbow: Treating Wesleigh Markovic/Extender: Jeanmarie Hubert Weeks in Treatment: 4 Vital Signs Height(in): 64 Pulse(bpm): 78 Weight(lbs): 180 Blood Pressure(mmHg): 120/66 Body Mass Index(BMI): 30.9 Temperature(F): 97.8 Respiratory Rate(breaths/min): 18 [9:Photos:] [N/A:N/A] Right T Second oe N/A N/A Wound Location: Gradually Appeared N/A N/A Wounding Event: Diabetic Wound/Ulcer of the Lower N/A N/A Primary EtiologyAGAM, WRUBEL ([id number]) [id number] Page 5  of 9 Extremity Chronic Obstructive Pulmonary N/A N/A Comorbid History: Disease (COPD), Congestive Heart Failure, Coronary Artery Disease, Hypertension, Peripheral Arterial Disease, Peripheral Venous Disease, Type II Diabetes, Rheumatoid Arthritis, Neuropathy 01/24/2022 N/A N/A Date Acquired: 4 N/A N/A Weeks of Treatment: Open N/A N/A Wound Status: No N/A N/A Wound Recurrence: 0.4x0.5x0.2 N/A N/A Measurements L x W x D (cm) 0.157 N/A N/A A (cm) : rea 0.031 N/A N/A Volume (cm) : -24.60% N/A N/A % Reduction in A rea: 18.40% N/A N/A % Reduction in Volume: Grade 2 N/A N/A Classification: Medium N/A N/A Exudate A mount: Serosanguineous N/A N/A Exudate Type: red, brown N/A N/A Exudate Color: None Present (0%) N/A N/A Granulation A mount: Large (67-100%) N/A N/A Necrotic A mount: Fat Layer (Subcutaneous Tissue): Yes N/A N/A Exposed Structures: Fascia: No Tendon: No Muscle: No Joint: No Bone: No None N/A N/A Epithelialization: Treatment Notes Wound #9 (Toe Second) Wound Laterality: Right Cleanser Soap and Water Discharge Instruction: Gently cleanse wound with antibacterial soap, rinse and pat dry prior to dressing wounds Peri-Wound Care Topical Primary  Dressing IODOFLEX 0.9% Cadexomer Iodine Pad Discharge Instruction: Apply Iodoflex to wound bed only as directed. Secondary Dressing Coverlet Latex-Free Fabric Adhesive Dressings Discharge Instruction: 1.5 x 2 Secured With Compression Wrap Compression Stockings Add-Ons Electronic Signature(s) Signed: 06/05/2022 3:46:00 PM By: Kalman Shan DO Entered By: Kalman Shan on 06/05/2022 15:45:43 [facility] Details -------------------------------------------------------------------------------- Thom Chimes ([id number]) [id number] Page 6 of 9 Patient Name: Date of Service: Kathryn [name], [place] NNA L. 06/05/2022 3:15 PM Medical Record Number: [id number] Patient Account Number: [id number] Date of Birth/Sex: Treating RN: 02-04-1934 (89 y.o. Kathryn [name] Primary Care Anaily Ashbaugh: [name] Other Clinician: Referring Kaya Pottenger: Treating Liadan Guizar/Extender: Jeanmarie Hubert Weeks in Treatment: 4 Active Inactive Wound/Skin Impairment Nursing Diagnoses: Knowledge deficit related to ulceration/compromised skin integrity Goals: Patient/caregiver will verbalize understanding of skin care regimen Date Initiated: 05/07/2022 Date Inactivated: 05/14/2022 Target Resolution Date: 06/05/2022 Goal Status: Met Ulcer/skin breakdown will have a volume reduction of 30% by week 4 Date Initiated: 05/07/2022 Target Resolution Date: 06/05/2022 Goal Status: Active Ulcer/skin breakdown will have a volume reduction of 50% by week 8 Date Initiated: 05/07/2022 Target Resolution Date: 07/06/2022 Goal Status: Active Ulcer/skin breakdown will have a volume reduction of 80% by week 12 Date Initiated: 05/07/2022 Target Resolution Date: 08/05/2022 Goal Status: Active Ulcer/skin breakdown will heal within 14 weeks Date Initiated: 05/07/2022 Target Resolution Date: 09/05/2022 Goal Status: Active Interventions: Assess patient/caregiver  ability to obtain necessary supplies Assess patient/caregiver ability to perform ulcer/skin care regimen upon admission and as needed Assess ulceration(s) every visit Notes: Electronic Signature(s) Signed: 06/11/2022 5:01:39 PM By: Carlene Coria RN Entered By: Carlene Coria on 06/05/2022 15:26:46 -------------------------------------------------------------------------------- Pain Assessment Details Patient Name: Date of Service: Kathryn [name], DE[place] NNA L. 06/05/2022 3:15 PM Medical Record Number: [id number] Patient Account Number: [id number] Date of Birth/Sex: Treating RN: [birth year] (89 y.o. Kathryn [name] Primary Care Legna Mausolf: [name] Other Clinician: Referring Honestie Kulik: Treating Mareli Antunes/Extender: Jeanmarie Hubert Weeks in Treatment: 4 Active Problems Location of Pain Severity and Description of Pain Patient Has Paino No Site Locations MALYNA, VERZOSA L ([id number]) [id number] Page 7 of 9 Pain Management and Medication Current Pain Management: Electronic Signature(s) Signed: 06/11/2022 5:01:39 PM By: Carlene Coria RN Entered By: Carlene Coria on 06/05/2022 15:20:56 -------------------------------------------------------------------------------- Patient/Caregiver Education Details Patient Name: Date of Service: Kathryn [name], DEA NNA Carlean Jews 3/12/2024andnbsp3:15 PM Medical Record Number: [id number] Patient Account Number: [id number] Date of Birth/Gender: Treating RN: 1933-09-08 (89 y.o. F)  Carlene Coria Primary Care Physician: [name] Other Clinician: Referring Physician: Treating Physician/Extender: Star Age in Treatment: 4 Education Assessment Education Provided To: Patient Education Topics Provided Pressure: Methods: Explain/Verbal Responses: State content correctly Electronic Signature(s) Signed: 06/11/2022 5:01:39 PM By: Carlene Coria RN Entered By: Carlene Coria  on 06/05/2022 15:27:04 Thom Chimes ([id number] Page 8 of 9 -------------------------------------------------------------------------------- Wound Assessment Details Patient Name: Date of Service: Kathryn [name] NNA L. 06/05/2022 3:15 PM Medical Record Number: [id number] Patient Account Number: [id number] Date of Birth/Sex: Treating RN: Aug 05, 1933 (89 y.o. Kathryn [name] Primary Care Breslin Burklow: [name] Other Clinician: Referring Stepen Prins: Treating Maddyson Keil/Extender: Jeanmarie Hubert Weeks in Treatment: 4 Wound Status Wound Number: 9 Primary Diabetic Wound/Ulcer of the Lower Extremity Etiology: Wound Location: Right T Second oe Wound Open Wounding Event: Gradually Appeared Status: Date Acquired: 01/24/2022 Comorbid Chronic Obstructive Pulmonary Disease (COPD), Congestive Heart Weeks Of Treatment: 4 History: Failure, Coronary Artery Disease, Hypertension, Peripheral Arterial Clustered Wound: No Disease, Peripheral Venous Disease, Type II Diabetes, Rheumatoid Arthritis, Neuropathy Photos Wound Measurements Length: (cm) 0.4 Width: (cm) 0.5 Depth: (cm) 0.2 Area: (cm) 0.157 Volume: (cm) 0.031 % Reduction in Area: -24.6% % Reduction in Volume: 18.4% Epithelialization: None Tunneling: No Undermining: No Wound Description Classification: Grade 2 Exudate Amount: Medium Exudate Type: Serosanguineous Exudate Color: red, brown Foul Odor After Cleansing: No Slough/Fibrino Yes Wound Bed Granulation Amount: None Present (0%) Exposed Structure Necrotic Amount: Large (67-100%) Fascia Exposed: No Necrotic Quality: Adherent Slough Fat Layer (Subcutaneous Tissue) Exposed: Yes Tendon Exposed: No Muscle Exposed: No Joint Exposed: No Bone Exposed: No Treatment Notes Wound #9 (Toe Second) Wound Laterality: Right Cleanser Soap and Water Discharge Instruction: Gently cleanse wound with antibacterial  soap, rinse and pat dry prior to dressing wounds Waldo, Tonnie L ([id number] Page 9 of 9 Peri-Wound Care Topical Primary Dressing IODOFLEX 0.9% Cadexomer Iodine Pad Discharge Instruction: Apply Iodoflex to wound bed only as directed. Secondary Dressing Coverlet Latex-Free Fabric Adhesive Dressings Discharge Instruction: 1.5 x 2 Secured With Compression Wrap Compression Stockings Add-Ons Electronic Signature(s) Signed: 06/11/2022 5:01:39 PM By: Carlene Coria RN Entered By: Carlene Coria on 06/05/2022 15:25:22 -------------------------------------------------------------------------------- Vitals Details Patient Name: Date of Service: Kathryn [name], [place] NNA L. 06/05/2022 3:15 PM Medical Record Number: [id number] Patient Account Number: [id number] Date of Birth/Sex: Treating RN: [birth year] (89 y.o. Kathryn [name] Primary Care Talon Regala: [name] Other Clinician: Referring Jasmaine Rochel: Treating Christia Coaxum/Extender: Jeanmarie Hubert Weeks in Treatment: 4 Vital Signs Time Taken: 15:20 Temperature (F): 97.8 Height (in): 64 Pulse (bpm): 78 Weight (lbs): 180 Respiratory Rate (breaths/min): 18 Body Mass Index (BMI): 30.9 Blood Pressure (mmHg): 120/66 Reference Range: 80 - 120 mg / dl Electronic Signature(s) Signed: 06/11/2022 5:01:39 PM By: Carlene Coria RN Entered By: Carlene Coria on 06/05/2022 15:20:44

## 2022-06-13 NOTE — Progress Notes (Addendum)
TZIPA, APPIAH ([id number]) [id number] Page 1 of 7 Visit Report for 06/12/2022 Chief Complaint Document Details Patient Name: Date of Service: Kathryn [name], [place] [name] 06/12/2022 4:00 PM Medical Record Number: [id number] Patient Account Number: [id number] Date of Birth/Sex: Treating RN: [birth year] (89 y.o. Kathryn [name] Primary Care Provider: Eulis [name] Other Clinician: Massie [name] Referring Provider: Treating Provider/Extender: Kathryn [name] in Treatment: 5 Information Obtained from: Patient Chief Complaint Right second toe ulcer Electronic Signature(s) Signed: 06/12/2022 4:27:26 PM By: Worthy Keeler PA-C Entered By: Worthy Keeler on 06/12/2022 16:27:26 -------------------------------------------------------------------------------- HPI Details Patient Name: Date of Service: Kathryn [name], [name] 06/12/2022 4:00 PM Medical Record Number: [id number] Patient Account Number: [id number] Date of Birth/Sex: Treating RN: 08/29/1933 (89 y.o. Kathryn [name] Primary Care Provider: Eulis [name] Other Clinician: Massie [name] Referring Provider: Treating Provider/Extender: Kathryn [name] in Treatment: 5 History of Present Illness HPI Description: 02-06-2022 upon evaluation today patient presents for initial inspection here in our clinic concerning wounds that she has over the bilateral feet as well as the right leg. She has been tolerating the dressing changes in general here without complication she has been putting antibiotic ointment on this. With that being said I do believe that she would benefit from the use of something like Xeroform gauze dressings which would likely be a bit better. Fortunately I do not see any signs of infection which is good news do not think she needs any antibiotics. She tells me that these wounds typically occur as a result of her bumping or hitting  her leg and then they slowly progressed from there. Patient does have a history of diabetes for which her hemoglobin A1c was 6.9 measured on 12-12-2021. She also has a history of arterial insufficiency with a right ABI of 0.66 with a TBI of 0.61 and a left ABI of 0.82 with a TBI of 0.45. She has a follow-up evaluation on 02-27-2022 with vascular. This testing was done on 01-28-2022. Subsequently the patient also has a history of hypertension, generalized weakness, congestive heart failure, and lower extremity edema. 02-13-2022 upon evaluation today patient appears to be doing well currently in regard to her wounds. She is actually making some good progress here which is great news and overall I am extremely pleased with where we stand. I do not see any evidence of worsening. 02-22-2022 upon evaluation today patient's wounds actually are showing some signs of improvement for the most part. Fortunately there does not appear to be any evidence of infection locally nor systemically at this point which is great news. No fevers, chills, nausea, vomiting, or diarrhea. She does have an a vascular appointment on December 5 and we will see what they have to say at that point. Kathryn [name] ([id number]) [id number] Page 2 of 7 03-08-2022 upon evaluation today patient actually appears to be doing excellent in regards to her wounds. She has been tolerating the dressing changes without complication. Fortunately there is no sign of active infection locally nor systemically at this point which is great news. Readmission: 05-07-2022 upon evaluation today patient presents for reevaluation here in the clinic it has been actually December 14 since I have last seen her. Subsequently she does still have a wound on the right second toe. This is the one area that has not healed everything on the left ended up healing. Fortunately I do not see any signs of infection at this point and really nothing  is changed in her  medical history since the last evaluation. She just tells me that this wound is just being stubborn and does not want to heal. 05-14-2022 upon evaluation today patient appears to be doing poorly in regard to her toe ulcer. Unfortunately she is showing signs of this being a little worse than last week. I was hopeful that this would actually show some signs of improvement but were definitely not seeing that. I think we probably need to switch to a different dressing, recommend Iodosorb/Iodoflex as an option here for her. 05-21-2022 upon evaluation today patient appears to be doing well currently in regard to her toe ulcer. I did review her culture as well as her x-ray both are negative at this point. There is no growth after 3 days. With that being said that she has been exposed this actually is looking much better Is read as it was and overall much happier with where things stand currently. I do not see any signs of infection locally nor systemically at this point. 3/4; this is a patient with a punched out area on the dorsal aspect of her right second toe in the inner phalangeal joint area. She had a previous 1 that healed on the left second toe. She saw Dr. Delana Meyer at [facility]. He did not feel she required any surgical procedure suggested venous. We have been using Iodoflex 3/12; patient presents for follow-up. She has been using Iodoflex to the second right toe. She has no issues or complaints today. 06-12-2022 upon evaluation today patient appears to be doing well currently in regard to her wound. She actually seems to be making some progress the Iodoflex seems to be doing a pretty good job here. Fortunately I do not see any evidence of active infection locally nor systemically which is great news. No fevers, chills, nausea, vomiting, or diarrhea. Electronic Signature(s) Signed: 06/12/2022 4:37:06 PM By: Worthy Keeler PA-C Entered By: Worthy Keeler on 06/12/2022  16:37:06 -------------------------------------------------------------------------------- Physical Exam Details Patient Name: Date of Service: Kathryn [name], [name] 06/12/2022 4:00 PM Medical Record Number: [id number] Patient Account Number: [id number] Date of Birth/Sex: Treating RN: 1933/04/26 (89 y.o. Kathryn [name] Primary Care Provider: Eulis [name] Other Clinician: Massie [name] Referring Provider: Treating Provider/Extender: Jeri Cos [name], Kathryn [name] in Treatment: 5 Constitutional Well-nourished and well-hydrated in no acute distress. Respiratory normal breathing without difficulty. Psychiatric this patient is able to make decisions and demonstrates good insight into disease process. Alert and Oriented x 3. pleasant and cooperative. Notes Upon inspection patient's wound bed actually showed signs of good granulation epithelization at this point. Fortunately I do not see any evidence of infection at this time. I do believe the Iodoflex is doing a good job cleaning up the wound surface I am pleased in that regard. Electronic Signature(s) Signed: 06/12/2022 4:37:28 PM By: Worthy Keeler PA-C Entered By: Worthy Keeler on 06/12/2022 16:37:28 Kathryn [name] ([id number]) [id number] Page 3 of 7 -------------------------------------------------------------------------------- Physician Orders Details Patient Name: Date of Service: Kathryn [name], [place] [name] 06/12/2022 4:00 PM Medical Record Number: [id number] Patient Account Number: [id number] Date of Birth/Sex: Treating RN: [birth year] (89 y.o. Kathryn [name] Primary Care Provider: Eulis [name] Other Clinician: Massie [name] Referring Provider: Treating Provider/Extender: Lance Coon in Treatment: 5 Verbal / Phone Orders: Yes Clinician: Cornell Barman Read Back and Verified: Yes Diagnosis Coding ICD-10 Coding Code Description E11.621 Type  2 diabetes mellitus with foot ulcer I73.89 Other specified peripheral vascular diseases L97.512 Non-pressure chronic ulcer  of other part of right foot with fat layer exposed M62.81 Muscle weakness (generalized) I50.42 Chronic combined systolic (congestive) and diastolic (congestive) heart failure I10 Essential (primary) hypertension Follow-up Appointments Return Appointment in 1 week. Bathing/ Shower/ Hygiene May shower; gently cleanse wound with antibacterial soap, rinse and pat dry prior to dressing wounds Anesthetic (Use 'Patient Medications' Section for Anesthetic Order Entry) Lidocaine applied to wound bed Edema Control - Lymphedema / Segmental Compressive Device / Other Elevate, Exercise Daily and A void Standing for Long Periods of Time. Elevate legs to the level of the heart and pump ankles as often as possible Elevate leg(s) parallel to the floor when sitting. DO YOUR BEST to sleep in the bed at night. DO NOT sleep in your recliner. Long hours of sitting in a recliner leads to swelling of the legs and/or potential wounds on your backside. Wound Treatment Wound #9 - T Second oe Wound Laterality: Right Cleanser: Soap and Water 3 x Per Week/30 Days Discharge Instructions: Gently cleanse wound with antibacterial soap, rinse and pat dry prior to dressing wounds Prim Dressing: IODOFLEX 0.9% Cadexomer Iodine Pad 3 x Per Week/30 Days ary Discharge Instructions: Apply Iodoflex to wound bed only as directed. Secondary Dressing: Coverlet Latex-Free Fabric Adhesive Dressings 3 x Per Week/30 Days Discharge Instructions: 1.5 x 2 Electronic Signature(s) Signed: 06/14/2022 11:20:18 AM By: Kathryn [name] Signed: 06/14/2022 5:06:45 PM By: Worthy Keeler PA-C Previous Signature: 06/12/2022 6:19:02 PM Version By: Worthy Keeler PA-C Previous Signature: 06/13/2022 5:18:06 PM Version By: Kathryn [name] Entered By: Kathryn [name] on 06/14/2022 11:19:37 Kathryn [name] ([id number])  [id number] Page 4 of 7 -------------------------------------------------------------------------------- Problem List Details Patient Name: Date of Service: Kathryn [name], [place] [name] 06/12/2022 4:00 PM Medical Record Number: [id number] Patient Account Number: [id number] Date of Birth/Sex: Treating RN: 12/20/1933 (89 y.o. Kathryn [name] Primary Care Provider: Eulis [name] Other Clinician: Massie [name] Referring Provider: Treating Provider/Extender: Verne Carrow, Kathryn [name] in Treatment: 5 Active Problems ICD-10 Encounter Code Description Active Date MDM Diagnosis E11.621 Type 2 diabetes mellitus with foot ulcer 05/07/2022 No Yes I73.89 Other specified peripheral vascular diseases 05/07/2022 No Yes L97.512 Non-pressure chronic ulcer of other part of right foot with fat layer exposed 05/07/2022 No Yes M62.81 Muscle weakness (generalized) 05/07/2022 No Yes I50.42 Chronic combined systolic (congestive) and diastolic (congestive) heart failure 05/07/2022 No Yes I10 Essential (primary) hypertension 05/07/2022 No Yes Inactive Problems Resolved Problems Electronic Signature(s) Signed: 06/12/2022 4:27:21 PM By: Worthy Keeler PA-C Entered By: Worthy Keeler on 06/12/2022 16:27:21 Progress Note Details -------------------------------------------------------------------------------- Kathryn [name] ([id number]) [id number] Page 5 of 7 Patient Name: Date of Service: Kathryn [name], [place] [name] 06/12/2022 4:00 PM Medical Record Number: [id number] Patient Account Number: [id number] Date of Birth/Sex: Treating RN: [birth year] (89 y.o. Kathryn [name] Primary Care Provider: Eulis [name] Other Clinician: Massie [name] Referring Provider: Treating Provider/Extender: Kathryn [name] in Treatment: 5 Subjective Chief Complaint Information obtained from Patient Right second toe  ulcer History of Present Illness (HPI) 02-06-2022 upon evaluation today patient presents for initial inspection here in our clinic concerning wounds that she has over the bilateral feet as well as the right leg. She has been tolerating the dressing changes in general here without complication she has been putting antibiotic ointment on this. With that being said I do believe that she would benefit from the use of something like Xeroform gauze dressings which would likely be a bit better. Fortunately I do not see any signs of infection  which is good news do not think she needs any antibiotics. She tells me that these wounds typically occur as a result of her bumping or hitting her leg and then they slowly progressed from there. Patient does have a history of diabetes for which her hemoglobin A1c was 6.9 measured on 12-12-2021. She also has a history of arterial insufficiency with a right ABI of 0.66 with a TBI of 0.61 and a left ABI of 0.82 with a TBI of 0.45. She has a follow-up evaluation on 02-27-2022 with vascular. This testing was done on 01-28-2022. Subsequently the patient also has a history of hypertension, generalized weakness, congestive heart failure, and lower extremity edema. 02-13-2022 upon evaluation today patient appears to be doing well currently in regard to her wounds. She is actually making some good progress here which is great news and overall I am extremely pleased with where we stand. I do not see any evidence of worsening. 02-22-2022 upon evaluation today patient's wounds actually are showing some signs of improvement for the most part. Fortunately there does not appear to be any evidence of infection locally nor systemically at this point which is great news. No fevers, chills, nausea, vomiting, or diarrhea. She does have an a vascular appointment on December 5 and we will see what they have to say at that point. 03-08-2022 upon evaluation today patient actually appears to be  doing excellent in regards to her wounds. She has been tolerating the dressing changes without complication. Fortunately there is no sign of active infection locally nor systemically at this point which is great news. Readmission: 05-07-2022 upon evaluation today patient presents for reevaluation here in the clinic it has been actually December 14 since I have last seen her. Subsequently she does still have a wound on the right second toe. This is the one area that has not healed everything on the left ended up healing. Fortunately I do not see any signs of infection at this point and really nothing is changed in her medical history since the last evaluation. She just tells me that this wound is just being stubborn and does not want to heal. 05-14-2022 upon evaluation today patient appears to be doing poorly in regard to her toe ulcer. Unfortunately she is showing signs of this being a little worse than last week. I was hopeful that this would actually show some signs of improvement but were definitely not seeing that. I think we probably need to switch to a different dressing, recommend Iodosorb/Iodoflex as an option here for her. 05-21-2022 upon evaluation today patient appears to be doing well currently in regard to her toe ulcer. I did review her culture as well as her x-ray both are negative at this point. There is no growth after 3 days. With that being said that she has been exposed this actually is looking much better Is read as it was and overall much happier with where things stand currently. I do not see any signs of infection locally nor systemically at this point. 3/4; this is a patient with a punched out area on the dorsal aspect of her right second toe in the inner phalangeal joint area. She had a previous 1 that healed on the left second toe. She saw Dr. Delana Meyer at [facility]. He did not feel she required any surgical procedure suggested venous. We have been using Iodoflex 3/12;  patient presents for follow-up. She has been using Iodoflex to the second right toe. She has no issues or  complaints today. 06-12-2022 upon evaluation today patient appears to be doing well currently in regard to her wound. She actually seems to be making some progress the Iodoflex seems to be doing a pretty good job here. Fortunately I do not see any evidence of active infection locally nor systemically which is great news. No fevers, chills, nausea, vomiting, or diarrhea. Objective Constitutional Well-nourished and well-hydrated in no acute distress. Vitals Time Taken: 4:13 PM, Height: 64 in, Weight: 180 lbs, BMI: 30.9, Temperature: 98.2 F, Pulse: 71 bpm, Respiratory Rate: 16 breaths/min, Blood Pressure: 119/70 mmHg. Respiratory normal breathing without difficulty. Psychiatric this patient is able to make decisions and demonstrates good insight into disease process. Alert and Oriented x 3. pleasant and cooperative. General Notes: Upon inspection patient's wound bed actually showed signs of good granulation epithelization at this point. Fortunately I do not see any evidence of infection at this time. I do believe the Iodoflex is doing a good job cleaning up the wound surface I am pleased in that regard. Integumentary (Hair, Skin) Kathryn [name], Kathryn [name] ([id number]) [id number] Page 6 of 7 Wound #9 status is Open. Original cause of wound was Gradually Appeared. The date acquired was: 01/24/2022. The wound has been in treatment 5 [name] The wound is located on the Right T Second. The wound measures 0.3cm length x 0.4cm width x 0.3cm depth; 0.094cm^2 area and 0.028cm^3 volume. There is oe Fat Layer (Subcutaneous Tissue) exposed. There is a medium amount of serosanguineous drainage noted. There is no granulation within the wound bed. There is a large (67-100%) amount of necrotic tissue within the wound bed including Adherent Slough. Assessment Active Problems ICD-10 Type 2  diabetes mellitus with foot ulcer Other specified peripheral vascular diseases Non-pressure chronic ulcer of other part of right foot with fat layer exposed Muscle weakness (generalized) Chronic combined systolic (congestive) and diastolic (congestive) heart failure Essential (primary) hypertension Plan Follow-up Appointments: Return Appointment in 1 week. Bathing/ Shower/ Hygiene: May shower; gently cleanse wound with antibacterial soap, rinse and pat dry prior to dressing wounds Anesthetic (Use 'Patient Medications' Section for Anesthetic Order Entry): Lidocaine applied to wound bed Edema Control - Lymphedema / Segmental Compressive Device / Other: Elevate, Exercise Daily and Avoid Standing for Long Periods of Time. Elevate legs to the level of the heart and pump ankles as often as possible Elevate leg(s) parallel to the floor when sitting. DO YOUR BEST to sleep in the bed at night. DO NOT sleep in your recliner. Long hours of sitting in a recliner leads to swelling of the legs and/or potential wounds on your backside. WOUND #9: - T Second Wound Laterality: Right oe Cleanser: Soap and Water 3 x Per Week/30 Days Discharge Instructions: Gently cleanse wound with antibacterial soap, rinse and pat dry prior to dressing wounds Prim Dressing: IODOFLEX 0.9% Cadexomer Iodine Pad 3 x Per Week/30 Days ary Discharge Instructions: Apply Iodoflex to wound bed only as directed. Secondary Dressing: Coverlet Latex-Free Fabric Adhesive Dressings 3 x Per Week/30 Days Discharge Instructions: 1.5 x 2 1. I would recommend currently that we have the patient continue to monitor for any signs of infection or worsening. Based on what I am seeing I do believe that the Iodoflex is doing a good job. 2. Also can recommend the patient should continue to monitor for any signs of infection or worsening. Obviously based on what I am seeing I believe that we are on the right track here but nonetheless Kathryn [name]  requires some time as far as getting this  wound to fill-in and completely closed I discussed with the patient that for the location this is actually a rather deep wound. We will see patient back for reevaluation in 1 week here in the clinic. If anything worsens or changes patient will contact our office for additional recommendations. Electronic Signature(s) Signed: 06/12/2022 4:38:09 PM By: Worthy Keeler PA-C Entered By: Worthy Keeler on 06/12/2022 16:38:08 -------------------------------------------------------------------------------- SuperBill Details Patient Name: Date of Service: Kathryn [name], [name] 06/12/2022 Medical Record Number: [id number] Patient Account Number: [id number] Date of Birth/Sex: Treating RN: 1933/04/21 (89 y.o. Kathryn [name] Primary Care Provider: Eulis [name] Other Clinician: Macle, Cram ([id number]) [id number] Page 7 of 7 Referring Provider: Treating Provider/Extender: Verne Carrow, Kathryn [name] in Treatment: 5 Diagnosis Coding ICD-10 Codes Code Description E11.621 Type 2 diabetes mellitus with foot ulcer I73.89 Other specified peripheral vascular diseases L97.512 Non-pressure chronic ulcer of other part of right foot with fat layer exposed M62.81 Muscle weakness (generalized) I50.42 Chronic combined systolic (congestive) and diastolic (congestive) heart failure I10 Essential (primary) hypertension Facility Procedures : CPT4 Code: [id number] Description: 99213 - WOUND CARE VISIT-LEV 3 EST PT Modifier: Quantity: 1 Physician Procedures : CPT4 Code Description Modifier [id number] - WC PHYS LEVEL 3 - EST PT ICD-10 Diagnosis Description E11.621 Type 2 diabetes mellitus with foot ulcer I73.89 Other specified peripheral vascular diseases L97.512 Non-pressure chronic ulcer of other part  of right foot with fat layer exposed M62.81 Muscle weakness (generalized) Quantity:  1 Electronic Signature(s) Signed: 06/12/2022 4:40:11 PM By: Worthy Keeler PA-C Entered By: Worthy Keeler on 06/12/2022 16:40:11

## 2022-06-14 NOTE — Progress Notes (Signed)
ZOIEY, CALIX ([id number]) [id number] Page 1 of 9 Visit Report for 06/12/2022 Arrival Information Details Patient Name: Date of Service: Kathryn [name], [place] [name] 06/12/2022 4:00 PM Medical Record Number: [id number] Patient Account Number: [id number] Date of Birth/Sex: Treating RN: [birth year] (89 y.o. Kathryn [name] Primary Care Naveah Brave: [name] Other Clinician: Massie Kluver Referring Marchia Diguglielmo: Treating Vandell Kun/Extender: Armandina Gemma Weeks in Treatment: 5 Visit Information History Since Last Visit All ordered tests and consults were completed: No Patient Arrived: Wheel Chair Added or deleted any medications: No Arrival Time: 16:09 Any new allergies or adverse reactions: No Transfer Assistance: None Had a fall or experienced change in No Patient Identification Verified: Yes activities of daily living that may affect Secondary Verification Process Completed: Yes risk of falls: Patient Requires Transmission-Based Precautions: No Signs or symptoms of abuse/neglect since last visito No Patient Has Alerts: Yes Hospitalized since last visit: No Patient Alerts: ABI [name] .82 02/06/22 Implantable device outside of the clinic excluding No ABI R .66 02/06/22 cellular tissue based products placed in the center since last visit: Has Dressing in Place as Prescribed: Yes Pain Present Now: No Electronic Signature(s) Signed: 06/13/2022 5:18:06 PM By: Massie Kluver Entered By: Massie Kluver on 06/12/2022 16:13:31 -------------------------------------------------------------------------------- Clinic Level of Care Assessment Details Patient Name: Date of Service: [name] [name] 06/12/2022 4:00 PM Medical Record Number: [id number] Patient Account Number: [id number] Date of Birth/Sex: Treating RN: [birth year] (89 y.o. Kathryn [name] Primary Care Griselle Rufer: [name] Other Clinician: Massie Kluver Referring  Selita Staiger: Treating Hence Derrick/Extender: Jeri Cos [name], [name] in Treatment: 5 Clinic Level of Care Assessment Items TOOL 4 Quantity Score []  - 0 Use when only an EandM is performed on FOLLOW-UP visit ASSESSMENTS - Nursing Assessment / Reassessment X- 1 10 Reassessment of Co-morbidities (includes updates in patient status) X- 1 5 Reassessment of Adherence to Treatment Plan Kathryn, [name] [name] ([id number]) [id number] Page 2 of 9 ASSESSMENTS - Wound and Skin A ssessment / Reassessment X - Simple Wound Assessment / Reassessment - one wound 1 5 []  - 0 Complex Wound Assessment / Reassessment - multiple wounds []  - 0 Dermatologic / Skin Assessment (not related to wound area) ASSESSMENTS - Focused Assessment []  - 0 Circumferential Edema Measurements - multi extremities []  - 0 Nutritional Assessment / Counseling / Intervention []  - 0 Lower Extremity Assessment (monofilament, tuning fork, pulses) []  - 0 Peripheral Arterial Disease Assessment (using hand held doppler) ASSESSMENTS - Ostomy and/or Continence Assessment and Care []  - 0 Incontinence Assessment and Management []  - 0 Ostomy Care Assessment and Management (repouching, etc.) PROCESS - Coordination of Care X - Simple Patient / Family Education for ongoing care 1 15 []  - 0 Complex (extensive) Patient / Family Education for ongoing care []  - 0 Staff obtains Programmer, systems, Records, T Results / Process Orders est []  - 0 Staff telephones HHA, Nursing Homes / Clarify orders / etc []  - 0 Routine Transfer to another Facility (non-emergent condition) []  - 0 Routine Hospital Admission (non-emergent condition) []  - 0 New Admissions / Biomedical engineer / Ordering NPWT Apligraf, etc. , []  - 0 Emergency Hospital Admission (emergent condition) X- 1 10 Simple Discharge Coordination []  - 0 Complex (extensive) Discharge Coordination PROCESS - Special Needs []  - 0 Pediatric / Minor  Patient Management []  - 0 Isolation Patient Management []  - 0 Hearing / Language / Visual special needs []  - 0 Assessment of Community assistance (transportation, D/C planning, etc.) []  - 0 Additional assistance / Altered mentation []  -  0 Support Surface(s) Assessment (bed, cushion, seat, etc.) INTERVENTIONS - Wound Cleansing / Measurement X - Simple Wound Cleansing - one wound 1 5 []  - 0 Complex Wound Cleansing - multiple wounds X- 1 5 Wound Imaging (photographs - any number of wounds) []  - 0 Wound Tracing (instead of photographs) X- 1 5 Simple Wound Measurement - one wound []  - 0 Complex Wound Measurement - multiple wounds INTERVENTIONS - Wound Dressings []  - 0 Small Wound Dressing one or multiple wounds X- 1 15 Medium Wound Dressing one or multiple wounds []  - 0 Large Wound Dressing one or multiple wounds []  - 0 Application of Medications - topical []  - 0 Application of Medications - injection INTERVENTIONS - Miscellaneous []  - 0 External ear exam Kathryn, DERKS [name] ([id number]) [id number] Page 3 of 9 []  - 0 Specimen Collection (cultures, biopsies, blood, body fluids, etc.) []  - 0 Specimen(s) / Culture(s) sent or taken to Lab for analysis []  - 0 Patient Transfer (multiple staff / Harrel Lemon Lift / Similar devices) []  - 0 Simple Staple / Suture removal (25 or less) []  - 0 Complex Staple / Suture removal (26 or more) []  - 0 Hypo / Hyperglycemic Management (close monitor of Blood Glucose) []  - 0 Ankle / Brachial Index (ABI) - do not check if billed separately X- 1 5 Vital Signs Has the patient been seen at the hospital within the last three years: Yes Total Score: 80 Level Of Care: New/Established - Level 3 Electronic Signature(s) Signed: 06/13/2022 5:18:06 PM By: Massie Kluver Entered By: Massie Kluver on 06/12/2022 16:32:52 -------------------------------------------------------------------------------- Encounter Discharge Information  Details Patient Name: Date of Service: Kathryn [name], [name] 06/12/2022 4:00 PM Medical Record Number: [id number] Patient Account Number: [id number] Date of Birth/Sex: Treating RN: May 06, 1933 (89 y.o. Kathryn [name] Primary Care Porchia Sinkler: [name] Other Clinician: Massie Kluver Referring Emylie Amster: Treating Derion Kreiter/Extender: Lance Coon in Treatment: 5 Encounter Discharge Information Items Discharge Condition: Stable Ambulatory Status: Wheelchair Discharge Destination: Home Transportation: Private Auto Accompanied By: son Schedule Follow-up Appointment: Yes Clinical Summary of Care: Electronic Signature(s) Signed: 06/13/2022 5:18:06 PM By: Massie Kluver Entered By: Massie Kluver on 06/12/2022 16:43:53 Lower Extremity Assessment Details -------------------------------------------------------------------------------- Kathryn [name] ([id number]) [id number] Page 4 of 9 Patient Name: Date of Service: Kathryn [name], [place] [name] 06/12/2022 4:00 PM Medical Record Number: [id number] Patient Account Number: [id number] Date of Birth/Sex: Treating RN: 10-Jan-1934 (89 y.o. Kathryn [name] Primary Care Crystian Frith: [name] Other Clinician: Massie Kluver Referring Anallely Rosell: Treating Ashlynd Michna/Extender: Jeri Cos [name], [name] in Treatment: 5 Edema Assessment Left: Right: Assessed: No Yes Edema: Yes Calf Left: Right: Point of Measurement: 32 cm From Medial Instep 33.8 cm Ankle Left: Right: Point of Measurement: 10 cm From Medial Instep 21.2 cm Vascular Assessment Left: Right: Pulses: Dorsalis Pedis Palpable: Yes Electronic Signature(s) Signed: 06/13/2022 8:02:39 AM By: Gretta Cool, BSN, RN, CWS, Kim RN, BSN Signed: 06/13/2022 5:18:06 PM By: Massie Kluver Entered By: Massie Kluver on 06/12/2022 16:24:24 -------------------------------------------------------------------------------- Multi  Wound Chart Details Patient Name: Date of Service: Kathryn [name], [name] 06/12/2022 4:00 PM Medical Record Number: [id number] Patient Account Number: [id number] Date of Birth/Sex: Treating RN: 1934-01-17 (89 y.o. Kathryn [name] Primary Care Carmon Brigandi: [name] Other Clinician: Massie Kluver Referring Jozy Mcphearson: Treating Ciria Bernardini/Extender: Jeri Cos [name], [name] in Treatment: 5 Vital Signs Height(in): 64 Pulse(bpm): 71 Weight(lbs): 180 Blood Pressure(mmHg): 119/70 Body Mass Index(BMI): 30.9 Temperature(F): 98.2 Respiratory Rate(breaths/min): 16 [9:Photos:] [N/A:N/A] Right T Second oe N/A N/A Wound Location: [place],  Erie [name] ([id number]) [id number] Page 5 of 9 Gradually Appeared N/A N/A Wounding Event: Diabetic Wound/Ulcer of the Lower N/A N/A Primary Etiology: Extremity Chronic Obstructive Pulmonary N/A N/A Comorbid History: Disease (COPD), Congestive Heart Failure, Coronary Artery Disease, Hypertension, Peripheral Arterial Disease, Peripheral Venous Disease, Type II Diabetes, Rheumatoid Arthritis, Neuropathy 01/24/2022 N/A N/A Date Acquired: 5 N/A N/A Weeks of Treatment: Open N/A N/A Wound Status: No N/A N/A Wound Recurrence: 0.3x0.4x0.3 N/A N/A Measurements [name] x W x D (cm) 0.094 N/A N/A A (cm) : rea 0.028 N/A N/A Volume (cm) : 25.40% N/A N/A % Reduction in A rea: 26.30% N/A N/A % Reduction in Volume: Grade 2 N/A N/A Classification: Medium N/A N/A Exudate A mount: Serosanguineous N/A N/A Exudate Type: red, brown N/A N/A Exudate Color: None Present (0%) N/A N/A Granulation A mount: Large (67-100%) N/A N/A Necrotic A mount: Fat Layer (Subcutaneous Tissue): Yes N/A N/A Exposed Structures: Fascia: No Tendon: No Muscle: No Joint: No Bone: No None N/A N/A Epithelialization: Treatment Notes Electronic Signature(s) Signed: 06/13/2022 5:18:06 PM By: Massie Kluver Entered By: Massie Kluver on 06/12/2022 16:24:31 -------------------------------------------------------------------------------- Multi-Disciplinary Care Plan Details Patient Name: Date of Service: Kathryn [name], [name] 06/12/2022 4:00 PM Medical Record Number: [id number] Patient Account Number: [id number] Date of Birth/Sex: Treating RN: September 10, 1933 (89 y.o. Kathryn [name] Primary Care Nishanth Mccaughan: [name] Other Clinician: Massie Kluver Referring Levern Kalka: Treating Wister Hoefle/Extender: Jeri Cos [name], [name] in Treatment: 5 Active Inactive Wound/Skin Impairment Nursing Diagnoses: Knowledge deficit related to ulceration/compromised skin integrity Goals: Patient/caregiver will verbalize understanding of skin care regimen Date Initiated: 05/07/2022 Date Inactivated: 05/14/2022 Target Resolution Date: 06/05/2022 Goal Status: Met Ulcer/skin breakdown will have a volume reduction of 30% by week 4 Date Initiated: 05/07/2022 Target Resolution Date: 06/05/2022 Goal Status: Active Ulcer/skin breakdown will have a volume reduction of 50% by week Kathryn [name], Kathryn [name] ([id number]) [id number] Page 6 of 9 Date Initiated: 05/07/2022 Target Resolution Date: 07/06/2022 Goal Status: Active Ulcer/skin breakdown will have a volume reduction of 80% by week 12 Date Initiated: 05/07/2022 Target Resolution Date: 08/05/2022 Goal Status: Active Ulcer/skin breakdown will heal within 14 weeks Date Initiated: 05/07/2022 Target Resolution Date: 09/05/2022 Goal Status: Active Interventions: Assess patient/caregiver ability to obtain necessary supplies Assess patient/caregiver ability to perform ulcer/skin care regimen upon admission and as needed Assess ulceration(s) every visit Notes: Electronic Signature(s) Signed: 06/13/2022 8:02:39 AM By: Gretta Cool, BSN, RN, CWS, Kim RN, BSN Signed: 06/13/2022 5:18:06 PM By: Massie Kluver Entered By: Massie Kluver on 06/12/2022  16:33:06 -------------------------------------------------------------------------------- Pain Assessment Details Patient Name: Date of Service: Kathryn [name], [name] 06/12/2022 4:00 PM Medical Record Number: [id number] Patient Account Number: [id number] Date of Birth/Sex: Treating RN: Jan 04, 1934 (89 y.o. Kathryn [name] Primary Care Brinden Kincheloe: [name] Other Clinician: Massie Kluver Referring Laia Wiley: Treating Abisola Carrero/Extender: Armandina Gemma Weeks in Treatment: 5 Active Problems Location of Pain Severity and Description of Pain Patient Has Paino No Site Locations Pain Management and Medication Current Pain Management: Electronic Signature(s) Signed: 06/13/2022 8:02:39 AM By: Gretta Cool, BSN, RN, CWS, Kim RN, BSN Signed: 06/13/2022 5:18:06 PM By: Geralyn Corwin, Geradine Girt ([id number]) [id number] Page 7 of 9 Entered By: Massie Kluver on 06/12/2022 16:15:44 -------------------------------------------------------------------------------- Patient/Caregiver Education Details Patient Name: Date of Service: Kathryn [name] 3/19/2024andnbsp4:00 PM Medical Record Number: [id number] Patient Account Number: [id number] Date of Birth/Gender: Treating RN: 1933-07-23 (89 y.o. Kathryn [name] Primary Care Physician: [name] Other Clinician: Massie Kluver Referring Physician: Treating Physician/Extender: Jeri Cos [name], Makiera Kathryn [name]  in Treatment: 5 Education Assessment Education Provided To: Patient Education Topics Provided Wound/Skin Impairment: Handouts: Other: continue wound care as directed Methods: Explain/Verbal Responses: State content correctly Electronic Signature(s) Signed: 06/13/2022 5:18:06 PM By: Massie Kluver Entered By: Massie Kluver on 06/12/2022 16:33:25 -------------------------------------------------------------------------------- Wound Assessment Details Patient  Name: Date of Service: Kathryn [name], [name] 06/12/2022 4:00 PM Medical Record Number: [id number] Patient Account Number: [id number] Date of Birth/Sex: Treating RN: 01-15-1934 (89 y.o. Kathryn [name] Primary Care Monika Chestang: [name] Other Clinician: Massie Kluver Referring Elvan Ebron: Treating Davonte Siebenaler/Extender: Jeri Cos [name], [name] in Treatment: 5 Wound Status Wound Number: 9 Primary Diabetic Wound/Ulcer of the Lower Extremity Etiology: Wound Location: Right T Second oe Wound Open Wounding Event: Gradually Appeared Status: Date Acquired: 01/24/2022 Comorbid Chronic Obstructive Pulmonary Disease (COPD), Congestive Heart Weeks Of Treatment: 5 History: Failure, Coronary Artery Disease, Hypertension, Peripheral Arterial Clustered Wound: No Disease, Peripheral Venous Disease, Type II Diabetes, Rheumatoid Arthritis, Neuropathy Photos Kathryn, Kathryn [name] ([id number]) [id number] Page 8 of 9 Wound Measurements Length: (cm) 0.3 Width: (cm) 0.4 Depth: (cm) 0.3 Area: (cm) 0.094 Volume: (cm) 0.028 % Reduction in Area: 25.4% % Reduction in Volume: 26.3% Epithelialization: None Wound Description Classification: Grade 2 Exudate Amount: Medium Exudate Type: Serosanguineous Exudate Color: red, brown Foul Odor After Cleansing: No Slough/Fibrino Yes Wound Bed Granulation Amount: None Present (0%) Exposed Structure Necrotic Amount: Large (67-100%) Fascia Exposed: No Necrotic Quality: Adherent Slough Fat Layer (Subcutaneous Tissue) Exposed: Yes Tendon Exposed: No Muscle Exposed: No Joint Exposed: No Bone Exposed: No Treatment Notes Wound #9 (Toe Second) Wound Laterality: Right Cleanser Soap and Water Discharge Instruction: Gently cleanse wound with antibacterial soap, rinse and pat dry prior to dressing wounds Peri-Wound Care Topical Primary Dressing IODOFLEX 0.9% Cadexomer Iodine Pad Discharge Instruction: Apply  Iodoflex to wound bed only as directed. Secondary Dressing Coverlet Latex-Free Fabric Adhesive Dressings Discharge Instruction: 1.5 x 2 Secured With Compression Wrap Compression Stockings Add-Ons Electronic Signature(s) Signed: 06/13/2022 8:02:39 AM By: Gretta Cool, BSN, RN, CWS, Kim RN, BSN Signed: 06/13/2022 5:18:06 PM By: Massie Kluver Entered By: Massie Kluver on 06/12/2022 16:23:19 [place], Geradine Girt ([id number]) [id number] Page 9 of 9 -------------------------------------------------------------------------------- Vitals Details Patient Name: Date of Service: Kathryn [name], [place] [name] 06/12/2022 4:00 PM Medical Record Number: [id number] Patient Account Number: [id number] Date of Birth/Sex: Treating RN: [birth year] (89 y.o. Kathryn [name] Primary Care Joscelyn Hardrick: [name] Other Clinician: Massie Kluver Referring Gurnoor Sloop: Treating Boleslaw Borghi/Extender: Jeri Cos [name], [name] in Treatment: 5 Vital Signs Time Taken: 16:13 Temperature (F): 98.2 Height (in): 64 Pulse (bpm): 71 Weight (lbs): 180 Respiratory Rate (breaths/min): 16 Body Mass Index (BMI): 30.9 Blood Pressure (mmHg): 119/70 Reference Range: 80 - 120 mg / dl Electronic Signature(s) Signed: 06/13/2022 5:18:06 PM By: Massie Kluver Entered By: Massie Kluver on 06/12/2022 16:15:37

## 2022-06-18 ENCOUNTER — Encounter: Payer: Medicare HMO | Admitting: Family

## 2022-06-19 ENCOUNTER — Encounter: Payer: Medicare HMO | Admitting: Physician Assistant

## 2022-06-19 DIAGNOSIS — E11621 Type 2 diabetes mellitus with foot ulcer: Secondary | ICD-10-CM | POA: Diagnosis not present

## 2022-06-19 NOTE — Progress Notes (Addendum)
Kathryn [name] ([id number]) [id number] Page 1 of 7 Visit Report for 06/19/2022 Chief Complaint Document Details Patient Name: Date of Service: Kathryn All [name] 06/19/2022 3:00 PM Medical Record Number: [id number] Patient Account Number: [id number] Date of Birth/Sex: Treating RN: [birth year] (89 y.o. Kathryn [name] Primary Care Provider: [name] Other Clinician: Referring Provider: Treating Provider/Extender: Armandina Gemma Weeks in Treatment: 6 Information Obtained from: Patient Chief Complaint Right second toe ulcer Electronic Signature(s) Signed: 06/19/2022 2:59:31 PM By: Worthy Keeler PA-C Entered By: Worthy Keeler on 06/19/2022 14:59:31 -------------------------------------------------------------------------------- Debridement Details Patient Name: Date of Service: Kathryn [name], [name] 06/19/2022 3:00 PM Medical Record Number: [id number] Patient Account Number: [id number] Date of Birth/Sex: Treating RN: [birth year] (89 y.o. Kathryn [name] Primary Care Provider: [name] Other Clinician: Referring Provider: Treating Provider/Extender: Armandina Gemma Weeks in Treatment: 6 Debridement Performed for Assessment: Wound #9 Right T Second oe Performed By: Physician Tommie Sams., PA-C Debridement Type: Debridement Severity of Tissue Pre Debridement: Fat layer exposed Level of Consciousness (Pre-procedure): Awake and Alert Pre-procedure Verification/Time Out Yes - 15:28 Taken: Pain Control: Lidocaine 4% T opical Solution T Area Debrided ([name] x W): otal 0.5 (cm) x 0.6 (cm) = 0.3 (cm) Tissue and other material debrided: Viable, Non-Viable, Slough, Subcutaneous, Slough Level: Skin/Subcutaneous Tissue Debridement Description: Excisional Instrument: Curette Bleeding: Minimum Hemostasis Achieved: Pressure Response to Treatment: Procedure was tolerated well Level of  Consciousness (Post- Awake and Alert procedure): Post Debridement Measurements of Total Wound Length: (cm) 0.5 Width: (cm) 0.6 Depth: (cm) 0.2 Volume: (cm) 0.047 Character of Wound/Ulcer Post Debridement: Stable Severity of Tissue Post Debridement: Fat layer exposed Post Procedure Diagnosis Same as Pre-procedure Electronic Signature(s) Signed: 06/19/2022 5:26:41 PM By: Worthy Keeler PA-C Signed: 06/21/2022 4:31:55 PM By: Levora Dredge Previous Signature: 06/19/2022 4:21:05 PM Version By: Levora Dredge Entered By: Worthy Keeler on 06/19/2022 17:26:41 Kathryn [name], Kathryn [name] ([id number]) [id number] Page 2 of 7 -------------------------------------------------------------------------------- HPI Details Patient Name: Date of Service: Kathryn [name], [place] [name] 06/19/2022 3:00 PM Medical Record Number: [id number] Patient Account Number: [id number] Date of Birth/Sex: Treating RN: 1933-09-13 (89 y.o. Kathryn [name] Primary Care Provider: [name] Other Clinician: Referring Provider: Treating Provider/Extender: Armandina Gemma Weeks in Treatment: 6 History of Present Illness HPI Description: 02-06-2022 upon evaluation today patient presents for initial inspection here in our clinic concerning wounds that she has over the bilateral feet as well as the right leg. She has been tolerating the dressing changes in general here without complication she has been putting antibiotic ointment on this. With that being said I do believe that she would benefit from the use of something like Xeroform gauze dressings which would likely be a bit better. Fortunately I do not see any signs of infection which is good news do not think she needs any antibiotics. She tells me that these wounds typically occur as a result of her bumping or hitting her leg and then they slowly progressed from there. Patient does have a history of diabetes for which her  hemoglobin A1c was 6.9 measured on 12-12-2021. She also has a history of arterial insufficiency with a right ABI of 0.66 with a TBI of 0.61 and a left ABI of 0.82 with a TBI of 0.45. She has a follow-up evaluation on 02-27-2022 with vascular. This testing was done on 01-28-2022. Subsequently the patient also has a history of hypertension, generalized weakness, congestive heart failure, and lower extremity edema. 02-13-2022 upon evaluation  today patient appears to be doing well currently in regard to her wounds. She is actually making some good progress here which is great news and overall I am extremely pleased with where we stand. I do not see any evidence of worsening. 02-22-2022 upon evaluation today patient's wounds actually are showing some signs of improvement for the most part. Fortunately there does not appear to be any evidence of infection locally nor systemically at this point which is great news. No fevers, chills, nausea, vomiting, or diarrhea. She does have an a vascular appointment on December 5 and we will see what they have to say at that point. 03-08-2022 upon evaluation today patient actually appears to be doing excellent in regards to her wounds. She has been tolerating the dressing changes without complication. Fortunately there is no sign of active infection locally nor systemically at this point which is great news. Readmission: 05-07-2022 upon evaluation today patient presents for reevaluation here in the clinic it has been actually December 14 since I have last seen her. Subsequently she does still have a wound on the right second toe. This is the one area that has not healed everything on the left ended up healing. Fortunately I do not see any signs of infection at this point and really nothing is changed in her medical history since the last evaluation. She just tells me that this wound is just being stubborn and does not want to heal. 05-14-2022 upon evaluation today patient  appears to be doing poorly in regard to her toe ulcer. Unfortunately she is showing signs of this being a little worse than last week. I was hopeful that this would actually show some signs of improvement but were definitely not seeing that. I think we probably need to switch to a different dressing, recommend Iodosorb/Iodoflex as an option here for her. 05-21-2022 upon evaluation today patient appears to be doing well currently in regard to her toe ulcer. I did review her culture as well as her x-ray both are negative at this point. There is no growth after 3 days. With that being said that she has been exposed this actually is looking much better Is read as it was and overall much happier with where things stand currently. I do not see any signs of infection locally nor systemically at this point. 3/4; this is a patient with a punched out area on the dorsal aspect of her right second toe in the inner phalangeal joint area. She had a previous 1 that healed on the left second toe. She saw Dr. Delana Meyer at [facility] vascular. He did not feel she required any surgical procedure suggested venous. We have been using Iodoflex 3/12; patient presents for follow-up. She has been using Iodoflex to the second right toe. She has no issues or complaints today. 06-12-2022 upon evaluation today patient appears to be doing well currently in regard to her wound. She actually seems to be making some progress the Iodoflex seems to be doing a pretty good job here. Fortunately I do not see any evidence of active infection locally nor systemically which is great news. No fevers, chills, nausea, vomiting, or diarrhea. 06-19-2022 upon evaluation today patient appears to be doing well currently in regard to her toe ulcer actually feel like this is getting cleaner and I am very pleased in that regard. I do not see any signs of infection locally or systemically. Electronic Signature(s) Signed: 06/19/2022 5:12:10 PM By: Worthy Keeler PA-C Entered By: Melburn Hake,  Leni Pankonin on 06/19/2022 17:12:10 -------------------------------------------------------------------------------- Physical Exam Details Patient Name: Date of Service: Kathryn [name], [place] [name] 06/19/2022 3:00 PM Medical Record Number: [id number] Patient Account Number: [id number] Date of Birth/Sex: Treating RN: 1933-04-30 (89 y.o. Kathryn [name] Primary Care Provider: [name] Other Clinician: Referring Provider: Treating Provider/Extender: Jeri Cos [name], [name] in Treatment: 6 Constitutional Well-nourished and well-hydrated in no acute distress. Respiratory Kathryn [name], Kathryn [name] ([id number]) [id number] Page 3 of 7 normal breathing without difficulty. Psychiatric this patient is able to make decisions and demonstrates good insight into disease process. Alert and Oriented x 3. pleasant and cooperative. Notes Upon inspection patient's wound bed actually showed signs of good granulation epithelization at this point. She does seem to be doing well with the Iodoflex I think this is doing a good job at this point. Overall I am extremely happy with where things stand currently. Electronic Signature(s) Signed: 06/19/2022 5:12:43 PM By: Worthy Keeler PA-C Entered By: Worthy Keeler on 06/19/2022 17:12:43 -------------------------------------------------------------------------------- Physician Orders Details Patient Name: Date of Service: Kathryn [name], [name] 06/19/2022 3:00 PM Medical Record Number: [id number] Patient Account Number: [id number] Date of Birth/Sex: Treating RN: 03/24/1934 (89 y.o. Kathryn [name] Primary Care Provider: [name] Other Clinician: Referring Provider: Treating Provider/Extender: Armandina Gemma Weeks in Treatment: 6 Verbal / Phone Orders: No Diagnosis Coding ICD-10 Coding Code Description E11.621 Type 2 diabetes mellitus with foot  ulcer I73.89 Other specified peripheral vascular diseases L97.512 Non-pressure chronic ulcer of other part of right foot with fat layer exposed M62.81 Muscle weakness (generalized) I50.42 Chronic combined systolic (congestive) and diastolic (congestive) heart failure I10 Essential (primary) hypertension Follow-up Appointments Return Appointment in 1 week. Bathing/ Shower/ Hygiene May shower; gently cleanse wound with antibacterial soap, rinse and pat dry prior to dressing wounds Anesthetic (Use 'Patient Medications' Section for Anesthetic Order Entry) Lidocaine applied to wound bed Edema Control - Lymphedema / Segmental Compressive Device / Other Elevate, Exercise Daily and A void Standing for Long Periods of Time. Elevate legs to the level of the heart and pump ankles as often as possible Elevate leg(s) parallel to the floor when sitting. DO YOUR BEST to sleep in the bed at night. DO NOT sleep in your recliner. Long hours of sitting in a recliner leads to swelling of the legs and/or potential wounds on your backside. Wound Treatment Wound #9 - T Second oe Wound Laterality: Right Cleanser: Soap and Water 3 x Per Week/30 Days Discharge Instructions: Gently cleanse wound with antibacterial soap, rinse and pat dry prior to dressing wounds Prim Dressing: IODOFLEX 0.9% Cadexomer Iodine Pad 3 x Per Week/30 Days ary Discharge Instructions: Apply Iodoflex to wound bed only as directed. Secondary Dressing: Coverlet Latex-Free Fabric Adhesive Dressings 3 x Per Week/30 Days Discharge Instructions: 1.5 x 2 Electronic Signature(s) Signed: 06/19/2022 4:21:05 PM By: Levora Dredge Signed: 06/19/2022 6:13:56 PM By: Worthy Keeler PA-C Entered By: Levora Dredge on 06/19/2022 15:31:09 Kathryn [name] ([id number]) [id number] Page 4 of 7 -------------------------------------------------------------------------------- Problem List Details Patient Name: Date of  Service: Kathryn [name], [place] [name] 06/19/2022 3:00 PM Medical Record Number: [id number] Patient Account Number: [id number] Date of Birth/Sex: Treating RN: 01-Mar-1934 (89 y.o. Kathryn [name] Primary Care Provider: [name] Other Clinician: Referring Provider: Treating Provider/Extender: Armandina Gemma Weeks in Treatment: 6 Active Problems ICD-10 Encounter Code Description Active Date MDM Diagnosis E11.621 Type 2 diabetes mellitus with foot ulcer 05/07/2022 No Yes I73.89 Other specified peripheral vascular diseases 05/07/2022 No Yes L97.512  Non-pressure chronic ulcer of other part of right foot with fat layer exposed 05/07/2022 No Yes M62.81 Muscle weakness (generalized) 05/07/2022 No Yes I50.42 Chronic combined systolic (congestive) and diastolic (congestive) heart failure 05/07/2022 No Yes I10 Essential (primary) hypertension 05/07/2022 No Yes Inactive Problems Resolved Problems Electronic Signature(s) Signed: 06/19/2022 2:59:28 PM By: Worthy Keeler PA-C Entered By: Worthy Keeler on 06/19/2022 14:59:28 -------------------------------------------------------------------------------- Progress Note Details Patient Name: Date of Service: Kathryn [name], [name] 06/19/2022 3:00 PM Medical Record Number: [id number] Patient Account Number: [id number] Date of Birth/Sex: Treating RN: Jul 14, 1933 (89 y.o. Kathryn [name] Primary Care Provider: [name] Other Clinician: Referring Provider: Treating Provider/Extender: Armandina Gemma Weeks in Treatment: 6 Subjective Chief Complaint Information obtained from Patient Right second toe ulcer History of Present Illness (HPI) 02-06-2022 upon evaluation today patient presents for initial inspection here in our clinic concerning wounds that she has over the bilateral feet as well as the right leg. She has been tolerating the dressing changes in general here without complication  she has been putting antibiotic ointment on this. With that being said I do believe that she would benefit from the use of something like Xeroform gauze dressings which would likely be a bit better. Fortunately I do not see any signs of infection which is good news do not think she needs any antibiotics. She tells me that these wounds typically occur as a result of her bumping or hitting her leg and then they slowly progressed from there. Patient does have a history of diabetes for which her hemoglobin A1c was 6.9 measured on 12-12-2021. She also has a history of arterial insufficiency with a right ABI of 0.66 with a TBI of 0.61 and a left ABI of 0.82 with a TBI of 0.45. She has a follow-up evaluation on 02-27-2022 with vascular. This testing was done Kathryn [name], Kathryn [name] ([id number]) [id number] Page 5 of 7 on 01-28-2022. Subsequently the patient also has a history of hypertension, generalized weakness, congestive heart failure, and lower extremity edema. 02-13-2022 upon evaluation today patient appears to be doing well currently in regard to her wounds. She is actually making some good progress here which is great news and overall I am extremely pleased with where we stand. I do not see any evidence of worsening. 02-22-2022 upon evaluation today patient's wounds actually are showing some signs of improvement for the most part. Fortunately there does not appear to be any evidence of infection locally nor systemically at this point which is great news. No fevers, chills, nausea, vomiting, or diarrhea. She does have an a vascular appointment on December 5 and we will see what they have to say at that point. 03-08-2022 upon evaluation today patient actually appears to be doing excellent in regards to her wounds. She has been tolerating the dressing changes without complication. Fortunately there is no sign of active infection locally nor systemically at this point which is great  news. Readmission: 05-07-2022 upon evaluation today patient presents for reevaluation here in the clinic it has been actually December 14 since I have last seen her. Subsequently she does still have a wound on the right second toe. This is the one area that has not healed everything on the left ended up healing. Fortunately I do not see any signs of infection at this point and really nothing is changed in her medical history since the last evaluation. She just tells me that this wound is just being stubborn and does not want to heal. 05-14-2022  upon evaluation today patient appears to be doing poorly in regard to her toe ulcer. Unfortunately she is showing signs of this being a little worse than last week. I was hopeful that this would actually show some signs of improvement but were definitely not seeing that. I think we probably need to switch to a different dressing, recommend Iodosorb/Iodoflex as an option here for her. 05-21-2022 upon evaluation today patient appears to be doing well currently in regard to her toe ulcer. I did review her culture as well as her x-ray both are negative at this point. There is no growth after 3 days. With that being said that she has been exposed this actually is looking much better Is read as it was and overall much happier with where things stand currently. I do not see any signs of infection locally nor systemically at this point. 3/4; this is a patient with a punched out area on the dorsal aspect of her right second toe in the inner phalangeal joint area. She had a previous 1 that healed on the left second toe. She saw Dr. Delana Meyer at vein and vascular. He did not feel she required any surgical procedure suggested venous. We have been using Iodoflex 3/12; patient presents for follow-up. She has been using Iodoflex to the second right toe. She has no issues or complaints today. 06-12-2022 upon evaluation today patient appears to be doing well currently in regard to  her wound. She actually seems to be making some progress the Iodoflex seems to be doing a pretty good job here. Fortunately I do not see any evidence of active infection locally nor systemically which is great news. No fevers, chills, nausea, vomiting, or diarrhea. 06-19-2022 upon evaluation today patient appears to be doing well currently in regard to her toe ulcer actually feel like this is getting cleaner and I am very pleased in that regard. I do not see any signs of infection locally or systemically. Objective Constitutional Well-nourished and well-hydrated in no acute distress. Vitals Time Taken: 3:13 PM, Height: 64 in, Weight: 180 lbs, BMI: 30.9, Temperature: 97.9 F, Pulse: 71 bpm, Respiratory Rate: 18 breaths/min, Blood Pressure: 127/68 mmHg. Respiratory normal breathing without difficulty. Psychiatric this patient is able to make decisions and demonstrates good insight into disease process. Alert and Oriented x 3. pleasant and cooperative. General Notes: Upon inspection patient's wound bed actually showed signs of good granulation epithelization at this point. She does seem to be doing well with the Iodoflex I think this is doing a good job at this point. Overall I am extremely happy with where things stand currently. Integumentary (Hair, Skin) Wound #9 status is Open. Original cause of wound was Gradually Appeared. The date acquired was: 01/24/2022. The wound has been in treatment 6 weeks. The wound is located on the Right T Second. The wound measures 0.5cm length x 0.6cm width x 0.2cm depth; 0.236cm^2 area and 0.047cm^3 volume. There is oe Fat Layer (Subcutaneous Tissue) exposed. There is a medium amount of serosanguineous drainage noted. There is no granulation within the wound bed. There is a large (67-100%) amount of necrotic tissue within the wound bed including Adherent Slough. Assessment Active Problems ICD-10 Type 2 diabetes mellitus with foot ulcer Other specified  peripheral vascular diseases Non-pressure chronic ulcer of other part of right foot with fat layer exposed Muscle weakness (generalized) Chronic combined systolic (congestive) and diastolic (congestive) heart failure Essential (primary) hypertension Kathryn [name], Kathryn [name] ([id number]) [id number] Page 6 of 7 Procedures Wound #9 Pre-procedure diagnosis  of Wound #9 is a Diabetic Wound/Ulcer of the Lower Extremity located on the Right T Second .Severity of Tissue Pre Debridement oe is: Fat layer exposed. There was a Excisional Skin/Subcutaneous Tissue Debridement with a total area of 0.3 sq cm performed by Tommie Sams., PA-C. With the following instrument(s): Curette to remove Viable and Non-Viable tissue/material. Material removed includes Subcutaneous Tissue and Slough and after achieving pain control using Lidocaine 4% T opical Solution. No specimens were taken. A time out was conducted at 15:28, prior to the start of the procedure. A Minimum amount of bleeding was controlled with Pressure. The procedure was tolerated well. Post Debridement Measurements: 0.5cm length x 0.6cm width x 0.2cm depth; 0.047cm^3 volume. Character of Wound/Ulcer Post Debridement is stable. Severity of Tissue Post Debridement is: Fat layer exposed. Post procedure Diagnosis Wound #9: Same as Pre-Procedure Plan Follow-up Appointments: Return Appointment in 1 week. Bathing/ Shower/ Hygiene: May shower; gently cleanse wound with antibacterial soap, rinse and pat dry prior to dressing wounds Anesthetic (Use 'Patient Medications' Section for Anesthetic Order Entry): Lidocaine applied to wound bed Edema Control - Lymphedema / Segmental Compressive Device / Other: Elevate, Exercise Daily and Avoid Standing for Long Periods of Time. Elevate legs to the level of the heart and pump ankles as often as possible Elevate leg(s) parallel to the floor when sitting. DO YOUR BEST to sleep in the bed at night.  DO NOT sleep in your recliner. Long hours of sitting in a recliner leads to swelling of the legs and/or potential wounds on your backside. WOUND #9: - T Second Wound Laterality: Right oe Cleanser: Soap and Water 3 x Per Week/30 Days Discharge Instructions: Gently cleanse wound with antibacterial soap, rinse and pat dry prior to dressing wounds Prim Dressing: IODOFLEX 0.9% Cadexomer Iodine Pad 3 x Per Week/30 Days ary Discharge Instructions: Apply Iodoflex to wound bed only as directed. Secondary Dressing: Coverlet Latex-Free Fabric Adhesive Dressings 3 x Per Week/30 Days Discharge Instructions: 1.5 x 2 1. I would recommend at this point that we have the patient continue to utilize the Iodoflex I think were closer to switching to the collagen but not quite there yet. She voiced understanding. 2. I am also can recommend the patient should continue to change this dressing 3 times per week I think that is a good regimen for her. We will see patient back for reevaluation in 1 week here in the clinic. If anything worsens or changes patient will contact our office for additional recommendations. Electronic Signature(s) Signed: 06/19/2022 5:37:36 PM By: Worthy Keeler PA-C Previous Signature: 06/19/2022 5:12:57 PM Version By: Worthy Keeler PA-C Entered By: Worthy Keeler on 06/19/2022 17:37:36 -------------------------------------------------------------------------------- SuperBill Details Patient Name: Date of Service: Kathryn [name], [name] 06/19/2022 Medical Record Number: [id number] Patient Account Number: [id number] Date of Birth/Sex: Treating RN: 08/12/1933 (89 y.o. Kathryn [name] Primary Care Provider: [name] Other Clinician: Referring Provider: Treating Provider/Extender: Armandina Gemma Weeks in Treatment: 6 Diagnosis Coding ICD-10 Codes Code Description E11.621 Type 2 diabetes mellitus with foot ulcer I73.89 Other specified peripheral  vascular diseases L97.512 Non-pressure chronic ulcer of other part of right foot with fat layer exposed M62.81 Muscle weakness (generalized) I50.42 Chronic combined systolic (congestive) and diastolic (congestive) heart failure I10 Essential (primary) hypertension Arnold, Lekita [name] ([id number]) [id number] Page 7 of 7 Facility Procedures : CPT4 Code: [id number] Description: [id number] - DEB SUBQ TISSUE 20 SQ CM/< ICD-10 Diagnosis Description L97.512 Non-pressure chronic ulcer of other part of right  foot with fat layer exposed Modifier: Quantity: 1 Physician Procedures : CPT4 Code Description Modifier [id number] - WC PHYS SUBQ TISS 20 SQ CM ICD-10 Diagnosis Description L97.512 Non-pressure chronic ulcer of other part of right foot with fat layer exposed Quantity: 1 Electronic Signature(s) Signed: 06/19/2022 5:37:54 PM By: Worthy Keeler PA-C Entered By: Worthy Keeler on 06/19/2022 17:37:54

## 2022-06-19 NOTE — Progress Notes (Addendum)
SHERIDA, TREVILLIAN ([id number]) [phone].pdf Page 1 of 7 Visit Report for 06/19/2022 Arrival Information Details Patient Name: Date of Service: [name], [place] NNA L. 06/19/2022 3:00 PM Medical Record Number: [id number] Patient Account Number: [id number] Date of Birth/Sex: Treating RN: 1933-04-28 (89 y.o. Valetta Close Primary Care Dashae Wilcher: [name] Other Clinician: Referring Sadat Sliwa: Treating Mkenzie Dotts/Extender: Lance Coon in Treatment: 6 Visit Information History Since Last Visit All ordered tests and consults were completed: No Patient Arrived: Wheel Chair Added or deleted any medications: No Arrival Time: 15:09 Any new allergies or adverse reactions: No Transfer Assistance: EasyPivot Patient Lift Had a fall or experienced change in No Patient Identification Verified: Yes activities of daily living that may affect Secondary Verification Process Completed: Yes risk of falls: Patient Requires Transmission-Based Precautions: No Signs or symptoms of abuse/neglect since last visito No Patient Has Alerts: Yes Hospitalized since last visit: No Patient Alerts: ABI L .82 02/06/22 Implantable device outside of the clinic excluding No ABI R .66 02/06/22 cellular tissue based products placed in the center since last visit: Has Dressing in Place as Prescribed: Yes Pain Present Now: Yes Electronic Signature(s) Signed: 06/19/2022 4:21:05 PM By: Levora Dredge Entered By: Levora Dredge on 06/19/2022 15:13:51 -------------------------------------------------------------------------------- Clinic Level of Care Assessment Details Patient Name: Date of Service: [name] NNA L. 06/19/2022 3:00 PM Medical Record Number: [id number] Patient Account Number: [id number] Date of Birth/Sex: Treating RN: 1934/02/29 (89 y.o. Valetta Close Primary Care Beckhem Isadore: [name] Other Clinician: Referring  Thadius Smisek: Treating America Sandall/Extender: Lance Coon in Treatment: 6 Clinic Level of Care Assessment Items TOOL 1 Quantity Score []  - 0 Use when EandM and Procedure is performed on INITIAL visit ASSESSMENTS - Nursing Assessment / Reassessment []  - 0 General Physical Exam (combine w/ comprehensive assessment (listed just below) when performed on new pt. evals) []  - 0 Comprehensive Assessment (HX, ROS, Risk Assessments, Wounds Hx, etc.) ASSESSMENTS - Wound and Skin Assessment / Reassessment []  - 0 Dermatologic / Skin Assessment (not related to wound area) ASSESSMENTS - Ostomy and/or Continence Assessment and Care []  - 0 Incontinence Assessment and Management []  - 0 Ostomy Care Assessment and Management (repouching, etc.) PROCESS - Coordination of Care []  - 0 Simple Patient / Family Education for ongoing care []  - 0 Complex (extensive) Patient / Family Education for ongoing care []  - 0 Staff obtains Programmer, systems, Records, T Results / Process Orders est []  - 0 Staff telephones HHA, Nursing Homes / Clarify orders / etc []  - 0 Routine Transfer to another Facility (non-emergent condition) []  - 0 Routine Hospital Admission (non-emergent condition) MARLAINE, YA L ([id number].pdf Page 2 of 7 []  - 0 New Admissions / Biomedical engineer / Ordering NPWT Apligraf, etc. , []  - 0 Emergency Hospital Admission (emergent condition) PROCESS - Special Needs []  - 0 Pediatric / Minor Patient Management []  - 0 Isolation Patient Management []  - 0 Hearing / Language / Visual special needs []  - 0 Assessment of Community assistance (transportation, D/C planning, etc.) []  - 0 Additional assistance / Altered mentation []  - 0 Support Surface(s) Assessment (bed, cushion, seat, etc.) INTERVENTIONS - Miscellaneous []  - 0 External ear exam []  - 0 Patient Transfer (multiple staff / Civil Service fast streamer / Similar devices) []  - 0 Simple  Staple / Suture removal (25 or less) []  - 0 Complex Staple / Suture removal (26 or more) []  - 0 Hypo/Hyperglycemic Management (do not check if billed separately) []  - 0 Ankle / Brachial Index (ABI) -  do not check if billed separately Has the patient been seen at the hospital within the last three years: Yes Total Score: 0 Level Of Care: ____ Electronic Signature(s) Signed: 06/19/2022 4:21:05 PM By: Levora Dredge Entered By: Levora Dredge on 06/19/2022 15:31:16 -------------------------------------------------------------------------------- Encounter Discharge Information Details Patient Name: Date of Service: [name], DEA NNA L. 06/19/2022 3:00 PM Medical Record Number: [id number] Patient Account Number: [id number] Date of Birth/Sex: Treating RN: 24-Feb-1934 (89 y.o. Valetta Close Primary Care Lynnea Vandervoort: [name] Other Clinician: Referring Mayerli Kirst: Treating Daymein Nunnery/Extender: Lance Coon in Treatment: 6 Encounter Discharge Information Items Post Procedure Vitals Discharge Condition: Stable Temperature (F): 97.9 Ambulatory Status: Wheelchair Pulse (bpm): 71 Discharge Destination: Home Respiratory Rate (breaths/min): 18 Transportation: Private Auto Blood Pressure (mmHg): 127/68 Accompanied By: son Schedule Follow-up Appointment: No Clinical Summary of Care: Electronic Signature(s) Signed: 06/19/2022 3:56:33 PM By: Levora Dredge Entered By: Levora Dredge on 06/19/2022 15:56:33 -------------------------------------------------------------------------------- Lower Extremity Assessment Details Patient Name: Date of Service: [name], DEA NNA L. 06/19/2022 3:00 PM Medical Record Number: [id number] Patient Account Number: [id number] Date of Birth/Sex: Treating RN: [birth year] (89 y.o. Valetta Close Primary Care Shulem Mader: [name] Other Clinician: Referring Taj Arteaga: Treating Christyann Manolis/Extender: Jeri Cos [name], [name] in Treatment: 6 Edema Assessment H[Left: BARBIE, GOVERT ([id number] Patrice [id number].pdf Page 3 of 7] Assessed: [Left: No] [Right: Yes] Edema: [Left: Ye] [Right: s] Calf Left: Right: Point of Measurement: 32 cm From Medial Instep 34 cm Ankle Left: Right: Point of Measurement: 10 cm From Medial Instep 21.1 cm Vascular Assessment Pulses: Dorsalis Pedis Palpable: [Right:Yes] Electronic Signature(s) Signed: 06/19/2022 4:21:05 PM By: Levora Dredge Entered By: Levora Dredge on 06/19/2022 15:22:07 -------------------------------------------------------------------------------- Multi Wound Chart Details Patient Name: Date of Service: [name], DEA NNA L. 06/19/2022 3:00 PM Medical Record Number: [id number] Patient Account Number: [id number] Date of Birth/Sex: Treating RN: 11-Mar-1934 (89 y.o. Valetta Close Primary Care Kiaria Quinnell: [name] Other Clinician: Referring Imberly Troxler: Treating Coltan Spinello/Extender: Jeri Cos [name], [name] in Treatment: 6 Vital Signs Height(in): 64 Pulse(bpm): 71 Weight(lbs): 180 Blood Pressure(mmHg): 127/68 Body Mass Index(BMI): 30.9 Temperature(F): 97.9 Respiratory Rate(breaths/min): 18 [9:Photos:] [N/A:N/A] Right T Second oe N/A N/A Wound Location: Gradually Appeared N/A N/A Wounding Event: Diabetic Wound/Ulcer of the Lower N/A N/A Primary Etiology: Extremity Chronic Obstructive Pulmonary N/A N/A Comorbid History: Disease (COPD), Congestive Heart Failure, Coronary Artery Disease, Hypertension, Peripheral Arterial Disease, Peripheral Venous Disease, Type II Diabetes, Rheumatoid Arthritis, Neuropathy 01/24/2022 N/A N/A Date Acquired: 6 N/A N/A Weeks of Treatment: Open N/A N/A Wound Status: No N/A N/A Wound Recurrence: 0.5x0.6x0.2 N/A N/A Measurements L x W x D (cm) 0.236 N/A N/A A (cm) : rea 0.047 N/A N/A Volume (cm) : -87.30%  N/A N/A % Reduction in A rea: -23.70% N/A N/A % Reduction in Volume: Grade 2 N/A N/A Classification: Medium N/A N/A Exudate A mount: Serosanguineous N/A N/A Exudate Type: red, brown N/A N/A Exudate Color: Dehoyos, Benetta L ([id number]) [phone].pdf Page 4 of 7 None Present (0%) N/A N/A Granulation Amount: Large (67-100%) N/A N/A Necrotic Amount: Fat Layer (Subcutaneous Tissue): Yes N/A N/A Exposed Structures: Fascia: No Tendon: No Muscle: No Joint: No Bone: No None N/A N/A Epithelialization: Treatment Notes Electronic Signature(s) Signed: 06/19/2022 4:21:05 PM By: Levora Dredge Entered By: Levora Dredge on 06/19/2022 15:22:11 -------------------------------------------------------------------------------- Multi-Disciplinary Care Plan Details Patient Name: Date of Service: [name], DEA NNA L. 06/19/2022 3:00 PM Medical Record Number: [id number] Patient Account Number: [id number] Date of Birth/Sex: Treating RN: 07-05-1933 (89 y.o. Drema Dallas, Urban Gibson  Primary Care Dahlton Hinde: [name] Other Clinician: Referring Savayah Waltrip: Treating Maryagnes Carrasco/Extender: Jeri Cos [name], [name] in Treatment: 6 Active Inactive Wound/Skin Impairment Nursing Diagnoses: Knowledge deficit related to ulceration/compromised skin integrity Goals: Patient/caregiver will verbalize understanding of skin care regimen Date Initiated: 05/07/2022 Date Inactivated: 05/14/2022 Target Resolution Date: 06/05/2022 Goal Status: Met Ulcer/skin breakdown will have a volume reduction of 30% by week 4 Date Initiated: 05/07/2022 Target Resolution Date: 06/05/2022 Goal Status: Active Ulcer/skin breakdown will have a volume reduction of 50% by week 8 Date Initiated: 05/07/2022 Target Resolution Date: 07/06/2022 Goal Status: Active Ulcer/skin breakdown will have a volume reduction of 80% by week 12 Date Initiated: 05/07/2022 Target Resolution Date:  08/05/2022 Goal Status: Active Ulcer/skin breakdown will heal within 14 weeks Date Initiated: 05/07/2022 Target Resolution Date: 09/05/2022 Goal Status: Active Interventions: Assess patient/caregiver ability to obtain necessary supplies Assess patient/caregiver ability to perform ulcer/skin care regimen upon admission and as needed Assess ulceration(s) every visit Notes: Electronic Signature(s) Signed: 06/19/2022 3:55:04 PM By: Levora Dredge Entered By: Levora Dredge on 06/19/2022 15:55:04 -------------------------------------------------------------------------------- Pain Assessment Details Patient Name: Date of Service: [name], DE[place] NNA L. 06/19/2022 3:00 PM Medical Record Number: [id number] Patient Account Number: [id number] Date of Birth/Sex: Treating RN: 06/06/1933 (89 y.o. Valetta Close Primary Care Aireanna Luellen: [name] Other Clinician: BRIGID, CHASE ([id number]) [id number] Page 5 of 7 Referring Tristen Luce: Treating Garv Kuechle/Extender: Jeri Cos [name], [name] in Treatment: 6 Active Problems Location of Pain Severity and Description of Pain Patient Has Paino Yes Site Locations Pain Location: Pain in Ulcers Duration of the Pain. Constant / Intermittento Intermittent Rate the pain. Current Pain Level: 2 Worst Pain Level: 6 Character of Pain Describe the Pain: Burning, Other: stinging Pain Management and Medication Current Pain Management: Medication: Yes Cold Application: No Rest: Yes Massage: No Activity: No T.E.N.S.: No Heat Application: No Leg drop or elevation: No Is the Current Pain Management Adequate: Inadequate How does your wound impact your activities of daily livingo Sleep: No Bathing: No Appetite: No Relationship With Others: No Bladder Continence: No Emotions: No Bowel Continence: No Work: No Toileting: No Drive: No Dressing: No Hobbies: No Electronic Signature(s) Signed:  06/19/2022 4:21:05 PM By: Levora Dredge Entered By: Levora Dredge on 06/19/2022 15:16:04 -------------------------------------------------------------------------------- Patient/Caregiver Education Details Patient Name: Date of Service: [name], DEA NNA Carlean Jews 3/26/2024andnbsp3:00 PM Medical Record Number: [id number] Patient Account Number: [id number] Date of Birth/Gender: Treating RN: 1934/02/23 (89 y.o. Valetta Close Primary Care Physician: [name] Other Clinician: Referring Physician: Treating Physician/Extender: Lance Coon in Treatment: 6 Education Assessment Education Provided To: Patient and Caregiver Education Topics Provided Wound Debridement: Handouts: Wound Debridement Methods: Explain/Verbal Responses: State content correctly SURA, ISLAS L ([id number]) [phone].pdf Page 6 of 7 Wound/Skin Impairment: Handouts: Caring for Your Ulcer Methods: Explain/Verbal Responses: State content correctly Electronic Signature(s) Signed: 06/19/2022 4:21:05 PM By: Levora Dredge Entered By: Levora Dredge on 06/19/2022 15:55:26 -------------------------------------------------------------------------------- Wound Assessment Details Patient Name: Date of Service: [name], DEA NNA L. 06/19/2022 3:00 PM Medical Record Number: [id number] Patient Account Number: [id number] Date of Birth/Sex: Treating RN: 01-31-1934 (89 y.o. Valetta Close Primary Care Georgenia Salim: [name] Other Clinician: Referring Carl Butner: Treating Jannelly Bergren/Extender: Jeri Cos [name], [name] in Treatment: 6 Wound Status Wound Number: 9 Primary Diabetic Wound/Ulcer of the Lower Extremity Etiology: Wound Location: Right T Second oe Wound Open Wounding Event: Gradually Appeared Status: Date Acquired: 01/24/2022 Comorbid Chronic Obstructive Pulmonary Disease (COPD), Congestive Heart Weeks Of  Treatment: 6 History: Failure, Coronary Artery Disease, Hypertension,  Peripheral Arterial Clustered Wound: No Disease, Peripheral Venous Disease, Type II Diabetes, Rheumatoid Arthritis, Neuropathy Photos Wound Measurements Length: (cm) 0.5 Width: (cm) 0.6 Depth: (cm) 0.2 Area: (cm) 0.236 Volume: (cm) 0.047 % Reduction in Area: -87.3% % Reduction in Volume: -23.7% Epithelialization: None Wound Description Classification: Grade 2 Exudate Amount: Medium Exudate Type: Serosanguineous Exudate Color: red, brown Foul Odor After Cleansing: No Slough/Fibrino Yes Wound Bed Granulation Amount: None Present (0%) Exposed Structure Necrotic Amount: Large (67-100%) Fascia Exposed: No Necrotic Quality: Adherent Slough Fat Layer (Subcutaneous Tissue) Exposed: Yes Tendon Exposed: No Muscle Exposed: No Joint Exposed: No Bone Exposed: No Treatment Notes Wound #9 (Toe Second) Wound Laterality: Right Cleanser YITTEL, RACHFORD L ([id number]) [phone].pdf Page 7 of 7 Soap and Water Discharge Instruction: Gently cleanse wound with antibacterial soap, rinse and pat dry prior to dressing wounds Peri-Wound Care Topical Primary Dressing IODOFLEX 0.9% Cadexomer Iodine Pad Discharge Instruction: Apply Iodoflex to wound bed only as directed. Secondary Dressing Coverlet Latex-Free Fabric Adhesive Dressings Discharge Instruction: 1.5 x 2 Secured With Compression Wrap Compression Stockings Add-Ons Electronic Signature(s) Signed: 06/19/2022 4:21:05 PM By: Levora Dredge Entered By: Levora Dredge on 06/19/2022 15:20:44 -------------------------------------------------------------------------------- Vitals Details Patient Name: Date of Service: [name], DEA NNA L. 06/19/2022 3:00 PM Medical Record Number: [id number] Patient Account Number: [id number] Date of Birth/Sex: Treating RN: 08-15-1933 (89 y.o. Valetta Close Primary Care Tareek Sabo: [name]  Other Clinician: Referring Neftali Abair: Treating Crosby Oriordan/Extender: Jeri Cos [name], [name] in Treatment: 6 Vital Signs Time Taken: 15:13 Temperature (F): 97.9 Height (in): 64 Pulse (bpm): 71 Weight (lbs): 180 Respiratory Rate (breaths/min): 18 Body Mass Index (BMI): 30.9 Blood Pressure (mmHg): 127/68 Reference Range: 80 - 120 mg / dl Electronic Signature(s) Signed: 06/19/2022 4:21:05 PM By: Levora Dredge Entered By: Levora Dredge on 06/19/2022 15:14:41

## 2022-06-20 ENCOUNTER — Telehealth: Payer: Self-pay | Admitting: Cardiovascular Disease

## 2022-06-20 MED ORDER — DAPAGLIFLOZIN PROPANEDIOL 10 MG PO TABS: 10.0000 mg | ORAL_TABLET | Freq: Every day | ORAL | 0 refills | Status: DC

## 2022-06-20 NOTE — Telephone Encounter (Signed)
Pt c/o medication issue:  1. Name of Medication: dapagliflozin propanediol (FARXIGA) 10 MG TABS tablet   2. How are you currently taking this medication (dosage and times per day)? 1 tablet daily  3. Are you having a reaction (difficulty breathing--STAT)? no  4. What is your medication issue? Patient states she had gone to the pharmacy to pick up her medication and they asked if she wanted the generic. She says she agreed because she thought it would be cheaper. She says now she went to pick up her refill and the pharmacist said the generic was $100, but the name brand farxiga was $50. She request a presciption for the name brand farxiga be sent to the pharmacy, because it is cheaper than the generic.

## 2022-06-20 NOTE — Telephone Encounter (Signed)
Patient has been advised that the refill has been sent in.

## 2022-06-21 ENCOUNTER — Other Ambulatory Visit
Admission: RE | Admit: 2022-06-21 | Discharge: 2022-06-21 | Disposition: A | Discharge status: Home / Self Care | Payer: Medicare HMO | Source: Ambulatory Visit | Attending: Family | Admitting: Family

## 2022-06-21 ENCOUNTER — Ambulatory Visit (HOSPITAL_BASED_OUTPATIENT_CLINIC_OR_DEPARTMENT_OTHER): Payer: Medicare HMO | Admitting: Family

## 2022-06-21 ENCOUNTER — Encounter: Payer: Self-pay | Admitting: Family

## 2022-06-21 VITALS — BP 102/54 | HR 76 | Wt 185.0 lb

## 2022-06-21 DIAGNOSIS — I890 Lymphedema, not elsewhere classified: Secondary | ICD-10-CM

## 2022-06-21 DIAGNOSIS — E119 Type 2 diabetes mellitus without complications: Secondary | ICD-10-CM | POA: Diagnosis not present

## 2022-06-21 DIAGNOSIS — I5022 Chronic systolic (congestive) heart failure: Secondary | ICD-10-CM | POA: Diagnosis present

## 2022-06-21 DIAGNOSIS — I10 Essential (primary) hypertension: Secondary | ICD-10-CM | POA: Diagnosis not present

## 2022-06-21 DIAGNOSIS — I471 Supraventricular tachycardia, unspecified: Secondary | ICD-10-CM

## 2022-06-21 LAB — BASIC METABOLIC PANEL
Anion gap: 12 (ref 5–15)
BUN: 41 mg/dL — ABNORMAL HIGH (ref 8–23)
CO2: 24 mmol/L (ref 22–32)
Calcium: 9.2 mg/dL (ref 8.9–10.3)
Chloride: 104 mmol/L (ref 98–111)
Creatinine, Ser: 1.42 mg/dL — ABNORMAL HIGH (ref 0.44–1.00)
GFR, Estimated: 36 mL/min — ABNORMAL LOW (ref >60)
Glucose, Bld: 148 mg/dL — ABNORMAL HIGH (ref 70–99)
Potassium: 5.5 mmol/L — ABNORMAL HIGH (ref 3.5–5.1)
Sodium: 140 mmol/L (ref 135–145)

## 2022-06-21 NOTE — Progress Notes (Signed)
Patient ID: Kathryn [name], female    DOB: Jul 19, 1933, 89 y.o.   MRN: [id number]  [name] is a 89 y/o female with a history of atrial fibrillation, DM, hyperlipidemia, HTN, thyroid disease, PVD, gastric ulcer, COPD, previous tobacco use and chronic heart failure.   Echo 05/24/22: EF 35-40% along with mild MR. Echo 03/28/20: EF of 40-45% along with mild LAE and mild MR/ AR.   Admitted 03/21/22 due to a/c heart failure. Diuresed. Admitted 03/13/22 due to COPD exacerbation.   She presents today for a HF follow-up visit with a chief complaint of moderate SOB w/ minimal exertion. Chronic in nature and fluctuates during the day. Has associated fatigue, pedal edema (improving), light-headedness, head congestion and chronic pain along with this. Denies any difficulty sleeping, abdominal distention, palpitations, chest pain or cough.   Has been taking torsemide 40mg  daily and also sitting in a recliner with her legs elevated and notes improvement in her pedal edema.   Not adding salt to her food but does occasionally eat salty foods. Ate sausage this morning for breakfast.   Past Medical History:  Diagnosis Date   Actinic keratosis    Aortic atherosclerosis ([place]) 05/25/2020   Arthritis    spinal stenosis   Cardiomyopathy - presumed to be nonischemic    a. 03/2020 Echo: EF 40-45%, gr2 DD. Nl RV fxn. Mildly dil LA. Mild MR/ao sclerosis; b. 03/2020 MV: EF 30-44%, no ischemia. Cor Ca2+ in pLAD.   CHF (congestive heart failure) (HCC)    COPD (chronic obstructive pulmonary disease) (HCC)    Coronary artery calcification seen on CT scan    a. 03/2020 MV: CT attenuation images show Ao and mod pLAD Ca2+.   Diabetes mellitus without complication ([place])    Gastric ulcer 06/2010   treated with Prolisec- states no problems now   Hyperlipidemia    Hypertension    PCP Dr Miguel Aschoff   [place]   Hypothyroidism    Left bundle branch block (LBBB)    Neuromuscular disorder (HCC)    slight numbness right  toes- comes and goes   Peripheral vascular disease (HCC)    varicose veins left leg   Pneumonia    PSVT (paroxysmal supraventricular tachycardia)    a. 03/2020 Zio: Avg HR 87 (66-211).  4 beats NSVT.  43 PSVT episodes, longest 10h 56m @ avg of 153 bpm (max 211).  Seen by EP-->amio started (pt wished to avoid EPS/RFCA).   Seasonal allergies    Shortness of breath    Skin cancer    multiple from face   Squamous cell carcinoma of skin 03/23/2013   L dorsal hand   Squamous cell carcinoma of skin 02/04/2014   R forearm/in situ, L pretibial   Squamous cell carcinoma of skin 09/24/2018   R thumb   Squamous cell carcinoma of skin 09/08/2019   L forearm - ED&C   Squamous cell carcinoma of skin 09/08/2019   R dosal hand - ED&C   Squamous cell carcinoma of skin 06/20/2021   R cheek preauricular, excised   Past Surgical History:  Procedure Laterality Date   BACK SURGERY     4/12  Dr Shellia Carwin- for lumbar stenosis   BREAST CYST ASPIRATION Left 1965   negative   [place]   lumpectomy with biopsy   left   CARPAL TUNNEL RELEASE     right   COLONOSCOPY     COLONOSCOPY WITH PROPOFOL N/A 06/30/2015   Procedure: COLONOSCOPY WITH PROPOFOL;  Surgeon: Hulen Luster, MD;  Location: [facility] ENDOSCOPY;  Service: Gastroenterology;  Laterality: N/A;   COLONOSCOPY WITH PROPOFOL N/A 01/22/2017   Procedure: COLONOSCOPY WITH PROPOFOL;  Surgeon: Lollie Sails, MD;  Location: [facility] ENDOSCOPY;  Service: Endoscopy;  Laterality: N/A;   COLONOSCOPY WITH PROPOFOL N/A 01/28/2019   Procedure: COLONOSCOPY WITH PROPOFOL;  Surgeon: Toledo, Benay Pike, MD;  Location: [facility] ENDOSCOPY;  Service: Gastroenterology;  Laterality: N/A;   ESOPHAGOGASTRODUODENOSCOPY     ESOPHAGOGASTRODUODENOSCOPY (EGD) WITH PROPOFOL N/A 01/28/2019   Procedure: ESOPHAGOGASTRODUODENOSCOPY (EGD) WITH PROPOFOL;  Surgeon: Toledo, Benay Pike, MD;  Location: [facility] ENDOSCOPY;  Service: Gastroenterology;  Laterality: N/A;   EYE SURGERY     cataract  extraction with IOL bilaterally   JOINT REPLACEMENT     LUMBAR LAMINECTOMY/DECOMPRESSION MICRODISCECTOMY  05/24/2011   Procedure: LUMBAR LAMINECTOMY/DECOMPRESSION MICRODISCECTOMY 2 LEVELS;  Surgeon: Magnus Sinning, MD;  Location: [facility] ORS;  Service: Orthopedics;  Laterality: N/A;  L2-L3, L3-L4 (x-ray)   RIGHT OOPHORECTOMY     due to STAPH INFECTION   Family History  Problem Relation Age of Onset   Dementia Mother    Lung cancer Father    Heart disease Father    Hypertension Sister    Breast cancer Maternal Aunt    Arthritis Son    Social History   Tobacco Use   Smoking status: Former    Packs/day: 0.50    Years: 50.00    Additional pack years: 0.00    Total pack years: 25.00    Types: Cigarettes    Quit date: 05/16/2004    Years since quitting: 18.1   Smokeless tobacco: Never  Substance Use Topics   Alcohol use: Yes    Comment: socially-1 glass of wine or [place] icea tea once a month   Allergies  Allergen Reactions   Oxycodone Other (See Comments)    Mental status change   Prednisone     swelling, bruising.   Prior to Admission medications   Medication Sig Start Date End Date Taking? Authorizing Provider  acetaminophen (TYLENOL) 500 MG tablet Take 1,000 mg by mouth every 6 (six) hours as needed. Pain    Yes [provider]  albuterol (PROVENTIL) (2.5 MG/3ML) 0.083% nebulizer solution USE 1 VIAL VIA NEBULIZER EVERY 6 HOURS AS NEEDED FOR WHEEZING OR SHORTNESS OF BREATH Patient taking differently: Take 2.5 mg by nebulization every 6 (six) hours as needed for wheezing or shortness of breath. 10/23/21  Yes Eulas Post, MD  amiodarone (PACERONE) 200 MG tablet Take 0.5 tablets (100 mg total) by mouth daily. 12/27/21  Yes Vickie Epley, MD  aspirin 81 MG EC tablet Take 81 mg by mouth daily. Swallow whole.   Yes [provider]  atorvastatin (LIPITOR) 40 MG tablet TAKE 1 TABLET(40 MG) BY MOUTH DAILY 05/07/22  Yes Gollan, Kathlene November, MD  carvedilol  (COREG) 6.25 MG tablet TAKE 1 TABLET(6.25 MG) BY MOUTH TWICE DAILY WITH A MEAL 05/07/22  Yes Gollan, Kathlene November, MD  dapagliflozin propanediol (FARXIGA) 10 MG TABS tablet Take 1 tablet (10 mg total) by mouth daily. 06/20/22  Yes Minna Merritts, MD  gabapentin (NEURONTIN) 400 MG capsule Take 1 capsule (400 mg total) by mouth 4 (four) times daily. 04/05/22  Yes [name], Makiera, MD  glucose blood (CONTOUR NEXT TEST) test strip Check sugar once daily  DX E11.9 11/16/16  Yes Eulas Post, MD  hydrOXYzine (ATARAX) 10 MG tablet TAKE 1 TO 2 TABLETS(10 TO 20 MG) BY MOUTH EVERY 6  HOURS AS NEEDED Patient taking differently: Take 10-20 mg by mouth every 6 (six) hours as needed for anxiety. TAKE 1 TO 2 TABLETS(10 TO 20 MG) BY MOUTH EVERY 6 HOURS AS NEEDED 03/07/22  Yes Rumball, Jake Church, DO  levothyroxine (SYNTHROID) 100 MCG tablet TAKE 1 TABLET(100 MCG) BY MOUTH DAILY BEFORE BREAKFAST Patient taking differently: Take 100 mcg by mouth daily before breakfast. TAKE 1 TABLET(100 MCG) BY MOUTH DAILY BEFORE BREAKFAST 02/26/22  Yes Myles Gip, DO  losartan (COZAAR) 25 MG tablet Take 1 tablet (25 mg total) by mouth every evening. 07/17/21  Yes Nekesha Font A, FNP  metFORMIN (GLUCOPHAGE) 1000 MG tablet TAKE 1 TABLET(1000 MG) BY MOUTH DAILY WITH SUPPER Patient taking differently: Take 1,000 mg by mouth daily with supper. TAKE 1 TABLET(1000 MG) BY MOUTH DAILY WITH SUPPER 02/05/22  Yes Rumball, Jake Church, DO  omeprazole (PRILOSEC) 20 MG capsule TAKE 1 CAPSULE(20 MG) BY MOUTH DAILY Patient taking differently: Take 20 mg by mouth daily. 01/01/22  Yes Ostwalt, Janna, PA-C  SPIRIVA HANDIHALER 18 MCG inhalation capsule PLACE 1 CAPSULE INTO INHALER AND INHALE EVERY DAY AS NEEDED Patient taking differently: Place 18 mcg into inhaler and inhale daily as needed. 05/25/21  Yes Eulas Post, MD  torsemide (DEMADEX) 20 MG tablet Take 2 tablets (40 mg total) by mouth daily.  03/24/22  Yes [place] Donnamarie Poag, DO     Review of Systems  Constitutional:  Positive for fatigue. Negative for appetite change.  HENT:  Positive for congestion and rhinorrhea. Negative for sore throat.   Eyes: Negative.   Respiratory:  Positive for shortness of breath. Negative for cough and chest tightness.   Cardiovascular:  Positive for leg swelling. Negative for chest pain and palpitations.  Gastrointestinal:  Negative for abdominal distention and abdominal pain.  Endocrine: Negative.   Genitourinary: Negative.   Musculoskeletal:  Positive for arthralgias (shoulders) and back pain.  Skin:  Positive for color change (right great toe (wound center)).  Allergic/Immunologic: Negative.   Neurological:  Positive for light-headedness (at times). Negative for dizziness.  Hematological:  Negative for adenopathy. Does not bruise/bleed easily.  Psychiatric/Behavioral:  Negative for dysphoric mood and sleep disturbance (sleeping on 1-2 pillows). The patient is not nervous/anxious.    Vitals:   06/21/22 1444  BP: (!) 102/54  Pulse: 76  SpO2: 97%  Weight: 185 lb (83.9 kg)   Wt Readings from Last 3 Encounters:  06/21/22 185 lb (83.9 kg)  05/07/22 181 lb (82.1 kg)  04/19/22 180 lb (81.6 kg)   Lab Results  Component Value Date   CREATININE 1.22 (H) 05/07/2022   CREATININE 1.21 (H) 04/09/2022   CREATININE 1.30 (H) 04/05/2022   Physical Exam Vitals and nursing note reviewed. Exam conducted with a chaperone present (sister).  Constitutional:      Appearance: She is well-developed.  HENT:     Head: Normocephalic and atraumatic.  Neck:     Vascular: No JVD.  Cardiovascular:     Rate and Rhythm: Normal rate and regular rhythm.  Pulmonary:     Effort: Pulmonary effort is normal. No respiratory distress.     Breath sounds: No wheezing or rales.  Abdominal:     Palpations: Abdomen is soft.     Tenderness: There is no abdominal tenderness.  Musculoskeletal:     Cervical back: Neck supple.     Right lower leg: No  tenderness. Edema (1+ pitting) present.     Left lower leg: No tenderness. Edema (1+ pitting) present.  Skin:    General: Skin is warm and dry.  Neurological:     General: No focal deficit present.     Mental Status: She is alert and oriented to person, place, and time.  Psychiatric:        Mood and Affect: Mood normal.        Behavior: Behavior normal.    Assessment & Plan:  1: NICM with reduced ejection fraction- - NYHA class III - euvolemic - weighing daily and reminded to call for an overnight weight gain of > 2 pounds or a weekly weight gain of >5 pounds - weight up 4 pounds from last visit here 6 weeks ago - echo 05/24/22: EF 35-40% along with mild MR. Echo 03/28/20: EF of 40-45% along with mild LAE and mild MR/ AR.  - not adding salt and is using Mrs Deliah Boston for seasoning - saw [facility] cardiology Rockey Situ) 08/28/21 - carvedilol 6.25mg  BID - losartan 25mg  daily - farxiga 10mg  daily - torsemide increased, at last visit, to 40mg  daily instead of alternating doses - BMP today - spiro may not be an option as K+ tends to trend high - wanted to switch her losartan to entresto but she just filled a 90 day supply; may titrate up her losartan but will get her lab work back first - BNP 03/21/22 was 676.4  2: HTN- - BP 102/54 - saw PCP ([name]) 04/05/22 - BMP 05/07/22 reviewed and showed sodium 142, potassium 5.3, creatinine 1.22 and GFR 43  3: Diabetes- - A1c 12/12/21 was 6.9%  4: PSVT- - saw EP Quentin Ore) 12/27/21 - continues on amiodarone 100mg  daily - RRR today  - possible ablation in the future if needed  5: Lymphedema- - saw vascular (Schnier) 03/29/22 - saw wound center 06/19/22 - wearing compression socks daily with removal at bedtime with some improvement of swelling - elevating legs more often because she now has a recliner that she can sit in; previously she had to lay on the cough to prop her legs up   Return in 1 month, sooner if needed.

## 2022-06-22 ENCOUNTER — Telehealth: Payer: Self-pay

## 2022-06-22 NOTE — Telephone Encounter (Signed)
  Spoke with pt regarding Kathryn Price, FNP diet recommendations.   Pt stated that Bananas, Potatoes and greens is all she eats. Pt stated "I am just going to starve my self".  Nurse offered education on eating these food in moderation since they are high in potassium.  Nurse offered other diet options, and education of a low-potassium diet.   Pt aware, agreeable, and verbalized understanding         Alisa Graff, FNP      Potassium is a little high. Ask patient if she's using NoSalt or eating foods high in potassium such as bananas, potatoes or dark leafy greens.

## 2022-06-25 ENCOUNTER — Encounter: Payer: Medicare HMO | Attending: Physician Assistant | Admitting: Physician Assistant

## 2022-06-25 DIAGNOSIS — I110 Hypertensive heart disease with heart failure: Secondary | ICD-10-CM | POA: Insufficient documentation

## 2022-06-25 DIAGNOSIS — M6281 Muscle weakness (generalized): Secondary | ICD-10-CM | POA: Diagnosis not present

## 2022-06-25 DIAGNOSIS — L97512 Non-pressure chronic ulcer of other part of right foot with fat layer exposed: Secondary | ICD-10-CM | POA: Insufficient documentation

## 2022-06-25 DIAGNOSIS — I5042 Chronic combined systolic (congestive) and diastolic (congestive) heart failure: Secondary | ICD-10-CM | POA: Diagnosis not present

## 2022-06-25 DIAGNOSIS — I7389 Other specified peripheral vascular diseases: Secondary | ICD-10-CM | POA: Diagnosis not present

## 2022-06-25 DIAGNOSIS — E11621 Type 2 diabetes mellitus with foot ulcer: Secondary | ICD-10-CM | POA: Diagnosis present

## 2022-06-25 NOTE — Progress Notes (Addendum)
MELANA, HINGLE ([id number]) [id number] Page 1 of 7 Visit Report for 06/25/2022 Chief Complaint Document Details Patient Name: Date of Service: Kathryn [name] Kathryn [name] 06/25/2022 3:45 PM Medical Record Number: [id number] Patient Account Number: [id number] Date of Birth/Sex: Treating RN: [birth year] (89 y.o. Kathryn [name] Primary Care Provider: Ronnald Ramp Other Clinician: Betha Loa Referring Provider: Treating Provider/Extender: Patric Dykes Weeks in Treatment: 7 Information Obtained from: Patient Chief Complaint Right second toe ulcer Electronic Signature(s) Signed: 06/25/2022 4:01:25 PM By: Lenda Kelp PA-C Entered By: Lenda Kelp on 06/25/2022 16:01:25 -------------------------------------------------------------------------------- Debridement Details Patient Name: Date of Service: Kathryn [name], DEA Kathryn [name] 06/25/2022 3:45 PM Medical Record Number: [id number] Patient Account Number: [id number] Date of Birth/Sex: Treating RN: Dec 17, 1933 (89 y.o. Kathryn [name] Primary Care Provider: Ronnald Ramp Other Clinician: Betha Loa Referring Provider: Treating Provider/Extender: Patric Dykes Weeks in Treatment: 7 Debridement Performed for Assessment: Wound #9 Right T Second oe Performed By: Physician Nelida Meuse., PA-C Debridement Type: Debridement Severity of Tissue Pre Debridement: Fat layer exposed Level of Consciousness (Pre-procedure): Awake and Alert Pre-procedure Verification/Time Out Yes - 14:28 Taken: Start Time: 14:28 T Area Debrided ([name] x W): otal 0.5 (cm) x 0.6 (cm) = 0.3 (cm) Tissue and other material debrided: Viable, Non-Viable Level: Non-Viable Tissue Debridement Description: Selective/Open Wound Instrument: Curette Bleeding: Minimum Hemostasis Achieved: Pressure Response to Treatment: Procedure was tolerated well Level of Consciousness (Post-  Awake and Alert procedure): Post Debridement Measurements of Total Wound Length: (cm) 0.5 Width: (cm) 0.6 Depth: (cm) 0.2 Volume: (cm) 0.047 Character of Wound/Ulcer Post Debridement: Stable Severity of Tissue Post Debridement: Fat layer exposed Post Procedure Diagnosis Same as Pre-procedure Electronic Signature(s) Signed: 06/25/2022 5:24:57 PM By: Angelina Pih Signed: 06/26/2022 4:53:44 PM By: Betha Loa Signed: 06/29/2022 8:47:01 AM By: Lenda Kelp PA-C Entered By: Betha Loa on 06/25/2022 16:30:20 Kathryn [name] ([id number]) [id number] Page 2 of 7 -------------------------------------------------------------------------------- HPI Details Patient Name: Date of Service: Kathryn [name] Kathryn [name] 06/25/2022 3:45 PM Medical Record Number: [id number] Patient Account Number: [id number] Date of Birth/Sex: Treating RN: [birth year] (89 y.o. Kathryn [name] Primary Care Provider: Ronnald Ramp Other Clinician: Betha Loa Referring Provider: Treating Provider/Extender: Patric Dykes Weeks in Treatment: 7 History of Present Illness HPI Description: 02-06-2022 upon evaluation today patient presents for initial inspection here in our clinic concerning wounds that she has over the bilateral feet as well as the right leg. She has been tolerating the dressing changes in general here without complication she has been putting antibiotic ointment on this. With that being said I do believe that she would benefit from the use of something like Xeroform gauze dressings which would likely be a bit better. Fortunately I do not see any signs of infection which is good news do not think she needs any antibiotics. She tells me that these wounds typically occur as a result of her bumping or hitting her leg and then they slowly progressed from there. Patient does have a history of diabetes for which her hemoglobin A1c was 6.9 measured  on 12-12-2021. She also has a history of arterial insufficiency with a right ABI of 0.66 with a TBI of 0.61 and a left ABI of 0.82 with a TBI of 0.45. She has a follow-up evaluation on 02-27-2022 with vascular. This testing was done on 01-28-2022. Subsequently the patient also has a history of hypertension, generalized weakness, congestive heart failure, and lower extremity edema. 02-13-2022 upon evaluation today patient appears  to be doing well currently in regard to her wounds. She is actually making some good progress here which is great news and overall I am extremely pleased with where we stand. I do not see any evidence of worsening. 02-22-2022 upon evaluation today patient's wounds actually are showing some signs of improvement for the most part. Fortunately there does not appear to be any evidence of infection locally nor systemically at this point which is great news. No fevers, chills, nausea, vomiting, or diarrhea. She does have an a vascular appointment on December 5 and we will see what they have to say at that point. 03-08-2022 upon evaluation today patient actually appears to be doing excellent in regards to her wounds. She has been tolerating the dressing changes without complication. Fortunately there is no sign of active infection locally nor systemically at this point which is great news. Readmission: 05-07-2022 upon evaluation today patient presents for reevaluation here in the clinic it has been actually December 14 since I have last seen her. Subsequently she does still have a wound on the right second toe. This is the one area that has not healed everything on the left ended up healing. Fortunately I do not see any signs of infection at this point and really nothing is changed in her medical history since the last evaluation. She just tells me that this wound is just being stubborn and does not want to heal. 05-14-2022 upon evaluation today patient appears to be doing poorly in  regard to her toe ulcer. Unfortunately she is showing signs of this being a little worse than last week. I was hopeful that this would actually show some signs of improvement but were definitely not seeing that. I think we probably need to switch to a different dressing, recommend Iodosorb/Iodoflex as an option here for her. 05-21-2022 upon evaluation today patient appears to be doing well currently in regard to her toe ulcer. I did review her culture as well as her x-ray both are negative at this point. There is no growth after 3 days. With that being said that she has been exposed this actually is looking much better Is read as it was and overall much happier with where things stand currently. I do not see any signs of infection locally nor systemically at this point. 3/4; this is a patient with a punched out area on the dorsal aspect of her right second toe in the inner phalangeal joint area. She had a previous 1 that healed on the left second toe. She saw Dr. Gilda Crease at [facility] vascular. He did not feel she required any surgical procedure suggested venous. We have been using Iodoflex 3/12; patient presents for follow-up. She has been using Iodoflex to the second right toe. She has no issues or complaints today. 06-12-2022 upon evaluation today patient appears to be doing well currently in regard to her wound. She actually seems to be making some progress the Iodoflex seems to be doing a pretty good job here. Fortunately I do not see any evidence of active infection locally nor systemically which is great news. No fevers, chills, nausea, vomiting, or diarrhea. 06-19-2022 upon evaluation today patient appears to be doing well currently in regard to her toe ulcer actually feel like this is getting cleaner and I am very pleased in that regard. I do not see any signs of infection locally or systemically. 06-25-2022 upon evaluation today patient appears to be doing well currently in regard to her wound. She  is  going require little bit of sharp debridement but fortunately I am seeing some definite improvements here. I do not see any signs of infection locally nor systemically which is great news. No fevers, chills, nausea, vomiting, or diarrhea. Electronic Signature(s) Signed: 06/25/2022 4:35:35 PM By: Lenda Kelp PA-C Entered By: Lenda Kelp on 06/25/2022 16:35:34 -------------------------------------------------------------------------------- Physical Exam Details Patient Name: Date of Service: Kathryn [name], DEA Kathryn [name] 06/25/2022 3:45 PM Medical Record Number: [id number] Patient Account Number: [id number] Date of Birth/Sex: Treating RN: 1933-05-27 (89 y.o. Kathryn [name] Primary Care Provider: Ronnald Ramp Other Clinician: Betha Loa Referring Provider: Treating Provider/Extender: Patric Dykes Weeks in Treatment: [address] RAEJEAN, SWINFORD [name] ([id number]) [id number] Page 3 of 7 Well-nourished and well-hydrated in no acute distress. Respiratory normal breathing without difficulty. Psychiatric this patient is able to make decisions and demonstrates good insight into disease process. Alert and Oriented x 3. pleasant and cooperative. Notes Upon inspection patient's wound bed actually showed signs of good granulation epithelization at this point. Fortunately I do not see any signs of infection or worsening overall I am very pleased in that regard. I think that she is ready to switch over to collagen. Electronic Signature(s) Signed: 06/25/2022 4:35:52 PM By: Lenda Kelp PA-C Entered By: Lenda Kelp on 06/25/2022 16:35:52 -------------------------------------------------------------------------------- Physician Orders Details Patient Name: Date of Service: Kathryn [name], DEA Kathryn [name] 06/25/2022 3:45 PM Medical Record Number: [id number] Patient Account Number: [id number] Date of Birth/Sex: Treating RN: [birth year] (89  y.o. Kathryn [name] Primary Care Provider: Ronnald Ramp Other Clinician: Betha Loa Referring Provider: Treating Provider/Extender: [name] in Treatment: 7 Verbal / Phone Orders: Yes Clinician: Angelina Pih Read Back and Verified: Yes Diagnosis Coding ICD-10 Coding Code Description E11.621 Type 2 diabetes mellitus with foot ulcer I73.89 Other specified peripheral vascular diseases L97.512 Non-pressure chronic ulcer of other part of right foot with fat layer exposed M62.81 Muscle weakness (generalized) I50.42 Chronic combined systolic (congestive) and diastolic (congestive) heart failure I10 Essential (primary) hypertension Follow-up Appointments Return Appointment in 1 week. Bathing/ Shower/ Hygiene May shower; gently cleanse wound with antibacterial soap, rinse and pat dry prior to dressing wounds Anesthetic (Use 'Patient Medications' Section for Anesthetic Order Entry) Lidocaine applied to wound bed Edema Control - Lymphedema / Segmental Compressive Device / Other Elevate, Exercise Daily and A void Standing for Long Periods of Time. Elevate legs to the level of the heart and pump ankles as often as possible Elevate leg(s) parallel to the floor when sitting. DO YOUR BEST to sleep in the bed at night. DO NOT sleep in your recliner. Long hours of sitting in a recliner leads to swelling of the legs and/or potential wounds on your backside. Wound Treatment Wound #9 - T Second oe Wound Laterality: Right Cleanser: Soap and Water 3 x Per Week/30 Days Discharge Instructions: Gently cleanse wound with antibacterial soap, rinse and pat dry prior to dressing wounds Prim Dressing: Prisma 4.34 (in) 3 x Per Week/30 Days ary Discharge Instructions: Moisten w/normal saline or sterile water; Cover wound as directed. Do not remove from wound bed. Secondary Dressing: Coverlet Latex-Free Fabric Adhesive Dressings 3 x Per Week/30  Days Discharge Instructions: 1.5 x 2 Electronic Signature(s) Signed: 06/26/2022 4:53:44 PM By: Betha Loa Signed: 06/29/2022 8:47:01 AM By: Lenda Kelp PA-C Entered By: Betha Loa on 06/25/2022 16:30:56 Kathryn [name] ([id number]) [id number] Page 4 of 7 -------------------------------------------------------------------------------- Problem List Details Patient Name: Date of Service: Kathryn [name], DEA Kathryn [name] 06/25/2022 3:45  PM Medical Record Number: [id number] Patient Account Number: [id number] Date of Birth/Sex: Treating RN: 1933/12/30 (89 y.o. Kathryn LinksF) Gordon, Caitlin Primary Care Provider: Ronnald RampSimmons-Robinson, Makiera Other Clinician: Betha LoaVenable, Angie Referring Provider: Treating Provider/Extender: Patric DykesStone, Jariyah Hackley [name], [name] in Treatment: 7 Active Problems ICD-10 Encounter Code Description Active Date MDM Diagnosis E11.621 Type 2 diabetes mellitus with foot ulcer 05/07/2022 No Yes I73.89 Other specified peripheral vascular diseases 05/07/2022 No Yes L97.512 Non-pressure chronic ulcer of other part of right foot with fat layer exposed 05/07/2022 No Yes M62.81 Muscle weakness (generalized) 05/07/2022 No Yes I50.42 Chronic combined systolic (congestive) and diastolic (congestive) heart failure 05/07/2022 No Yes I10 Essential (primary) hypertension 05/07/2022 No Yes Inactive Problems Resolved Problems Electronic Signature(s) Signed: 06/25/2022 4:01:22 PM By: Lenda KelpStone III, Jovin Fester PA-C Entered By: Lenda KelpStone III, Joesiah Lonon on 06/25/2022 16:01:22 -------------------------------------------------------------------------------- Progress Note Details Patient Name: Date of Service: Kathryn OldHINTO N, DEA Kathryn [name] 06/25/2022 3:45 PM Medical Record Number: [id number] Patient Account Number: [id number] Date of Birth/Sex: Treating RN: 1933/12/30 (89 y.o. Kathryn LinksF) Gordon, Caitlin Primary Care Provider: Ronnald RampSimmons-Robinson, Makiera Other Clinician: Betha LoaVenable, Angie Referring Provider: Treating  Provider/Extender: Patric DykesStone, Orie Cuttino [name], [name] in Treatment: 7 Subjective Chief Complaint Information obtained from Patient Right second toe ulcer History of Present Illness (HPI) 02-06-2022 upon evaluation today patient presents for initial inspection here in our clinic concerning wounds that she has over the bilateral feet as well as the right leg. She has been tolerating the dressing changes in general here without complication she has been putting antibiotic ointment on this. With that being said I do believe that she would benefit from the use of something like Xeroform gauze dressings which would likely be a bit better. Fortunately I do not see any signs of infection which is good news do not think she needs any antibiotics. She tells me that these wounds typically occur as a result of her bumping or hitting her leg and then they slowly progressed from there. Kathryn OliphantHINTON, Kathryn [name] ([id number]) [id number] Page 5 of 7 Patient does have a history of diabetes for which her hemoglobin A1c was 6.9 measured on 12-12-2021. She also has a history of arterial insufficiency with a right ABI of 0.66 with a TBI of 0.61 and a left ABI of 0.82 with a TBI of 0.45. She has a follow-up evaluation on 02-27-2022 with vascular. This testing was done on 01-28-2022. Subsequently the patient also has a history of hypertension, generalized weakness, congestive heart failure, and lower extremity edema. 02-13-2022 upon evaluation today patient appears to be doing well currently in regard to her wounds. She is actually making some good progress here which is great news and overall I am extremely pleased with where we stand. I do not see any evidence of worsening. 02-22-2022 upon evaluation today patient's wounds actually are showing some signs of improvement for the most part. Fortunately there does not appear to be any evidence of infection locally nor systemically at this point  which is great news. No fevers, chills, nausea, vomiting, or diarrhea. She does have an a vascular appointment on December 5 and we will see what they have to say at that point. 03-08-2022 upon evaluation today patient actually appears to be doing excellent in regards to her wounds. She has been tolerating the dressing changes without complication. Fortunately there is no sign of active infection locally nor systemically at this point which is great news. Readmission: 05-07-2022 upon evaluation today patient presents for reevaluation here in the clinic it has been actually December 14 since I have  last seen her. Subsequently she does still have a wound on the right second toe. This is the one area that has not healed everything on the left ended up healing. Fortunately I do not see any signs of infection at this point and really nothing is changed in her medical history since the last evaluation. She just tells me that this wound is just being stubborn and does not want to heal. 05-14-2022 upon evaluation today patient appears to be doing poorly in regard to her toe ulcer. Unfortunately she is showing signs of this being a little worse than last week. I was hopeful that this would actually show some signs of improvement but were definitely not seeing that. I think we probably need to switch to a different dressing, recommend Iodosorb/Iodoflex as an option here for her. 05-21-2022 upon evaluation today patient appears to be doing well currently in regard to her toe ulcer. I did review her culture as well as her x-ray both are negative at this point. There is no growth after 3 days. With that being said that she has been exposed this actually is looking much better Is read as it was and overall much happier with where things stand currently. I do not see any signs of infection locally nor systemically at this point. 3/4; this is a patient with a punched out area on the dorsal aspect of her right second toe  in the inner phalangeal joint area. She had a previous 1 that healed on the left second toe. She saw Dr. Gilda Crease at [facility] vascular. He did not feel she required any surgical procedure suggested venous. We have been using Iodoflex 3/12; patient presents for follow-up. She has been using Iodoflex to the second right toe. She has no issues or complaints today. 06-12-2022 upon evaluation today patient appears to be doing well currently in regard to her wound. She actually seems to be making some progress the Iodoflex seems to be doing a pretty good job here. Fortunately I do not see any evidence of active infection locally nor systemically which is great news. No fevers, chills, nausea, vomiting, or diarrhea. 06-19-2022 upon evaluation today patient appears to be doing well currently in regard to her toe ulcer actually feel like this is getting cleaner and I am very pleased in that regard. I do not see any signs of infection locally or systemically. 06-25-2022 upon evaluation today patient appears to be doing well currently in regard to her wound. She is going require little bit of sharp debridement but fortunately I am seeing some definite improvements here. I do not see any signs of infection locally nor systemically which is great news. No fevers, chills, nausea, vomiting, or diarrhea. Objective Constitutional Well-nourished and well-hydrated in no acute distress. Vitals Time Taken: 3:43 PM, Height: 64 in, Weight: 180 lbs, BMI: 30.9, Temperature: 97.8 F, Pulse: 68 bpm, Respiratory Rate: 18 breaths/min, Blood Pressure: 122/52 mmHg. Respiratory normal breathing without difficulty. Psychiatric this patient is able to make decisions and demonstrates good insight into disease process. Alert and Oriented x 3. pleasant and cooperative. General Notes: Upon inspection patient's wound bed actually showed signs of good granulation epithelization at this point. Fortunately I do not see any signs  of infection or worsening overall I am very pleased in that regard. I think that she is ready to switch over to collagen. Integumentary (Hair, Skin) Wound #9 status is Open. Original cause of wound was Gradually Appeared. The date acquired was: 01/24/2022. The wound has  been in treatment 7 weeks. The wound is located on the Right T Second. The wound measures 0.5cm length x 0.6cm width x 0.2cm depth; 0.236cm^2 area and 0.047cm^3 volume. There is oe Fat Layer (Subcutaneous Tissue) exposed. There is a medium amount of serosanguineous drainage noted. There is no granulation within the wound bed. There is a large (67-100%) amount of necrotic tissue within the wound bed including Adherent Slough. Assessment Active Problems ICD-10 Type 2 diabetes mellitus with foot ulcer Other specified peripheral vascular diseases Kathryn, BUITRAGO [name] ([id number]) [id number] Page 6 of 7 Non-pressure chronic ulcer of other part of right foot with fat layer exposed Muscle weakness (generalized) Chronic combined systolic (congestive) and diastolic (congestive) heart failure Essential (primary) hypertension Procedures Wound #9 Pre-procedure diagnosis of Wound #9 is a Diabetic Wound/Ulcer of the Lower Extremity located on the Right T Second .Severity of Tissue Pre Debridement oe is: Fat layer exposed. There was a Selective/Open Wound Non-Viable Tissue Debridement with a total area of 0.3 sq cm performed by Nelida Meuse., PA-C. With the following instrument(s): Curette to remove Viable and Non-Viable tissue/material.. A time out was conducted at 14:28, prior to the start of the procedure. A Minimum amount of bleeding was controlled with Pressure. The procedure was tolerated well. Post Debridement Measurements: 0.5cm length x 0.6cm width x 0.2cm depth; 0.047cm^3 volume. Character of Wound/Ulcer Post Debridement is stable. Severity of Tissue Post Debridement is: Fat layer exposed. Post procedure  Diagnosis Wound #9: Same as Pre-Procedure Plan Follow-up Appointments: Return Appointment in 1 week. Bathing/ Shower/ Hygiene: May shower; gently cleanse wound with antibacterial soap, rinse and pat dry prior to dressing wounds Anesthetic (Use 'Patient Medications' Section for Anesthetic Order Entry): Lidocaine applied to wound bed Edema Control - Lymphedema / Segmental Compressive Device / Other: Elevate, Exercise Daily and Avoid Standing for Long Periods of Time. Elevate legs to the level of the heart and pump ankles as often as possible Elevate leg(s) parallel to the floor when sitting. DO YOUR BEST to sleep in the bed at night. DO NOT sleep in your recliner. Long hours of sitting in a recliner leads to swelling of the legs and/or potential wounds on your backside. WOUND #9: - T Second Wound Laterality: Right oe Cleanser: Soap and Water 3 x Per Week/30 Days Discharge Instructions: Gently cleanse wound with antibacterial soap, rinse and pat dry prior to dressing wounds Prim Dressing: Prisma 4.34 (in) 3 x Per Week/30 Days ary Discharge Instructions: Moisten w/normal saline or sterile water; Cover wound as directed. Do not remove from wound bed. Secondary Dressing: Coverlet Latex-Free Fabric Adhesive Dressings 3 x Per Week/30 Days Discharge Instructions: 1.5 x 2 1. I am recommend that we going to have the patient continue to monitor for any signs of infection or worsening. Based on what I am seeing I think that were definitely headed in the right direction and she is very pleased with where things stand currently. 2. I am also going to recommend that we switch over to collagen. She is in agreement with plan and we will see how things stand next week. We will see patient back for reevaluation in 1 week here in the clinic. If anything worsens or changes patient will contact our office for additional recommendations. Electronic Signature(s) Signed: 06/25/2022 4:36:26 PM By: Lenda Kelp  PA-C Entered By: Lenda Kelp on 06/25/2022 16:36:26 -------------------------------------------------------------------------------- SuperBill Details Patient Name: Date of Service: Kathryn [name], DEA Kathryn [name] 06/25/2022 Medical Record Number: [id number] Patient Account Number: [id number] Date  of Birth/Sex: Treating RN: 1933-03-31 (89 y.o. Kathryn [name] Primary Care Provider: Ronnald Ramp Other Clinician: Betha Loa Referring Provider: Treating Provider/Extender: Patric Dykes Weeks in Treatment: 7 Diagnosis Coding ICD-10 Codes Code Description E11.621 Type 2 diabetes mellitus with foot ulcer I73.89 Other specified peripheral vascular diseases L97.512 Non-pressure chronic ulcer of other part of right foot with fat layer exposed Kathryn, KOSEL [name] ([id number]) [phone].pdf Page 7 of 7 M62.81 Muscle weakness (generalized) I50.42 Chronic combined systolic (congestive) and diastolic (congestive) heart failure I10 Essential (primary) hypertension Facility Procedures : CPT4 Code: [id number] Description: [phone] - DEBRIDE WOUND 1ST 20 SQ CM OR < ICD-10 Diagnosis Description L97.512 Non-pressure chronic ulcer of other part of right foot with fat layer exposed Modifier: Quantity: 1 Physician Procedures : CPT4 Code Description Modifier [id number] - WC PHYS DEBR WO ANESTH 20 SQ CM ICD-10 Diagnosis Description L97.512 Non-pressure chronic ulcer of other part of right foot with fat layer exposed Quantity: 1 Electronic Signature(s) Signed: 06/25/2022 4:44:14 PM By: Lenda Kelp PA-C Entered By: Lenda Kelp on 06/25/2022 16:44:13

## 2022-06-27 NOTE — Progress Notes (Signed)
SUSSIE, DIOGO ([id number]) [id number] Page 1 of 7 Visit Report for 06/25/2022 Arrival Information Details Patient Name: Date of Service: Kathryn [name] 06/25/2022 3:45 PM Medical Record Number: [id number] Patient Account Number: [id number] Date of Birth/Sex: Treating RN: 11/19/1933 (89 y.o. Valetta Close Primary Care Marvia Troost: [name] Other Clinician: Massie Kluver Referring Romond Pipkins: Treating Joda Braatz/Extender: Armandina Gemma Weeks in Treatment: 7 Visit Information History Since Last Visit All ordered tests and consults were completed: No Patient Arrived: Wheel Chair Added or deleted any medications: No Arrival Time: 15:38 Any new allergies or adverse reactions: No Transfer Assistance: None Had a fall or experienced change in No Patient Identification Verified: Yes activities of daily living that may affect Secondary Verification Process Completed: Yes risk of falls: Patient Requires Transmission-Based Precautions: No Signs or symptoms of abuse/neglect since last visito No Patient Has Alerts: Yes Hospitalized since last visit: No Patient Alerts: ABI [name] .82 02/06/22 Implantable device outside of the clinic excluding No ABI R .66 02/06/22 cellular tissue based products placed in the center since last visit: Has Dressing in Place as Prescribed: Yes Pain Present Now: No Electronic Signature(s) Signed: 06/26/2022 4:53:44 PM By: Massie Kluver Entered By: Massie Kluver on 06/25/2022 15:42:58 -------------------------------------------------------------------------------- Clinic Level of Care Assessment Details Patient Name: Date of Service: Kathryn [name] 06/25/2022 3:45 PM Medical Record Number: [id number] Patient Account Number: [id number] Date of Birth/Sex: Treating RN: Mar 09, 1934 (89 y.o. Valetta Close Primary Care Baelynn Schmuhl: [name] Other Clinician: Massie Kluver Referring  Keven Soucy: Treating Lataria Courser/Extender: Jeri Cos [name], [name] in Treatment: 7 Clinic Level of Care Assessment Items TOOL 1 Quantity Score []  - 0 Use when EandM and Procedure is performed on INITIAL visit ASSESSMENTS - Nursing Assessment / Reassessment []  - 0 General Physical Exam (combine w/ comprehensive assessment (listed just below) when performed on new pt. evals) []  - 0 Comprehensive Assessment (HX, ROS, Risk Assessments, Wounds Hx, etc.) ASSESSMENTS - Wound and Skin Assessment / Reassessment []  - 0 Dermatologic / Skin Assessment (not related to wound area) ASSESSMENTS - Ostomy and/or Continence Assessment and Care []  - 0 Incontinence Assessment and Management []  - 0 Ostomy Care Assessment and Management (repouching, etc.) PROCESS - Coordination of Care []  - 0 Simple Patient / Family Education for ongoing care []  - 0 Complex (extensive) Patient / Family Education for ongoing care []  - 0 Staff obtains Programmer, systems, Records, T Results / Process Orders est []  - 0 Staff telephones HHA, Nursing Homes / Clarify orders / etc []  - 0 Routine Transfer to another Facility (non-emergent condition) []  - 0 Routine Hospital Admission (non-emergent condition) DEVONYA, NEES [name] ([id number] Page 2 of 7 []  - 0 New Admissions / Biomedical engineer / Ordering NPWT Apligraf, etc. , []  - 0 Emergency Hospital Admission (emergent condition) PROCESS - Special Needs []  - 0 Pediatric / Minor Patient Management []  - 0 Isolation Patient Management []  - 0 Hearing / Language / Visual special needs []  - 0 Assessment of Community assistance (transportation, D/C planning, etc.) []  - 0 Additional assistance / Altered mentation []  - 0 Support Surface(s) Assessment (bed, cushion, seat, etc.) INTERVENTIONS - Miscellaneous []  - 0 External ear exam []  - 0 Patient Transfer (multiple staff / Civil Service fast streamer / Similar devices) []  - 0 Simple  Staple / Suture removal (25 or less) []  - 0 Complex Staple / Suture removal (26 or more) []  - 0 Hypo/Hyperglycemic Management (do not check if billed separately) []  - 0 Ankle / Brachial  Index (ABI) - do not check if billed separately Has the patient been seen at the hospital within the last three years: Yes Total Score: 0 Level Of Care: ____ Electronic Signature(s) Signed: 06/26/2022 4:53:44 PM By: Massie Kluver Entered By: Massie Kluver on 06/25/2022 16:31:03 -------------------------------------------------------------------------------- Encounter Discharge Information Details Patient Name: Date of Service: [name], [name] 06/25/2022 3:45 PM Medical Record Number: [id number] Patient Account Number: [id number] Date of Birth/Sex: Treating RN: [birth year] (89 y.o. Valetta Close Primary Care Yehia Mcbain: [name] Other Clinician: Massie Kluver Referring Vilas Edgerly: Treating Antionette Luster/Extender: Jeri Cos [name], [name] in Treatment: 7 Encounter Discharge Information Items Post Procedure Vitals Discharge Condition: Stable Temperature (F): 97.8 Ambulatory Status: Wheelchair Pulse (bpm): 68 Discharge Destination: Home Respiratory Rate (breaths/min): 18 Transportation: Private Auto Blood Pressure (mmHg): 122/52 Accompanied By: son Schedule Follow-up Appointment: Yes Clinical Summary of Care: Electronic Signature(s) Signed: 06/26/2022 4:53:44 PM By: Massie Kluver Entered By: Massie Kluver on 06/25/2022 16:41:39 -------------------------------------------------------------------------------- Lower Extremity Assessment Details Patient Name: Date of Service: [name], [name] 06/25/2022 3:45 PM Medical Record Number: [id number] Patient Account Number: [id number] Date of Birth/Sex: Treating RN: [birth year] (89 y.o. Valetta Close Primary Care Livian Vanderbeck: [name] Other Clinician: Massie Kluver Referring Deantre Bourdon: Treating  Jamarie Mussa/Extender: Jeri Cos [name], [name] in Treatment: 7 Edema Assessment H[Left: NOU, LIZALDE ([id number] Patrice [id number] Page 3 of 7] Assessed: [Left: No] [Right: Yes] Edema: [Left: Ye] [Right: s] Calf Left: Right: Point of Measurement: 32 cm From Medial Instep 34 cm Ankle Left: Right: Point of Measurement: 10 cm From Medial Instep 22.2 cm Vascular Assessment Pulses: Dorsalis Pedis Palpable: [Right:Yes] Electronic Signature(s) Signed: 06/25/2022 5:24:57 PM By: Levora Dredge Signed: 06/26/2022 4:53:44 PM By: Massie Kluver Entered By: Massie Kluver on 06/25/2022 15:55:20 -------------------------------------------------------------------------------- Multi Wound Chart Details Patient Name: Date of Service: [name], [name] 06/25/2022 3:45 PM Medical Record Number: [id number] Patient Account Number: [id number] Date of Birth/Sex: Treating RN: Sep 28, 1933 (89 y.o. Valetta Close Primary Care Bailee Thall: [name] Other Clinician: Massie Kluver Referring Aairah Negrette: Treating Mennie Spiller/Extender: Jeri Cos [name], [name] in Treatment: 7 Vital Signs Height(in): 64 Pulse(bpm): 37 Weight(lbs): 180 Blood Pressure(mmHg): 122/52 Body Mass Index(BMI): 30.9 Temperature(F): 97.8 Respiratory Rate(breaths/min): 18 [9:Photos:] [N/A:N/A] Right T Second oe N/A N/A Wound Location: Gradually Appeared N/A N/A Wounding Event: Diabetic Wound/Ulcer of the Lower N/A N/A Primary Etiology: Extremity Chronic Obstructive Pulmonary N/A N/A Comorbid History: Disease (COPD), Congestive Heart Failure, Coronary Artery Disease, Hypertension, Peripheral Arterial Disease, Peripheral Venous Disease, Type II Diabetes, Rheumatoid Arthritis, Neuropathy 01/24/2022 N/A N/A Date Acquired: 7 N/A N/A Weeks of Treatment: Open N/A N/A Wound Status: No N/A N/A Wound Recurrence: 0.5x0.6x0.2 N/A N/A Measurements  [name] x W x D (cm) 0.236 N/A N/A A (cm) : rea 0.047 N/A N/A Volume (cm) : -87.30% N/A N/A % Reduction in A rea: -23.70% N/A N/A % Reduction in Volume: Grade 2 N/A N/A Classification: Medium N/A N/A Exudate A mount: Serosanguineous N/A N/A Exudate TypeREDIA, TURCO [name] ([id number] Page 4 of 7 red, brown N/A N/A Exudate Color: None Present (0%) N/A N/A Granulation Amount: Large (67-100%) N/A N/A Necrotic Amount: Fat Layer (Subcutaneous Tissue): Yes N/A N/A Exposed Structures: Fascia: No Tendon: No Muscle: No Joint: No Bone: No None N/A N/A Epithelialization: Treatment Notes Electronic Signature(s) Signed: 06/26/2022 4:53:44 PM By: Massie Kluver Entered By: Massie Kluver on 06/25/2022 15:55:38 -------------------------------------------------------------------------------- [place] Details Patient Name: Date of Service: [name], [name] 06/25/2022 3:45 PM Medical Record Number:  [id number] Patient Account Number: [id number] Date of Birth/Sex: Treating RN: 08-10-1933 (89 y.o. Valetta Close Primary Care Lauris Serviss: [name] Other Clinician: Massie Kluver Referring Katharin Schneider: Treating Lavell Ridings/Extender: Jeri Cos [name], [name] in Treatment: 7 Active Inactive Wound/Skin Impairment Nursing Diagnoses: Knowledge deficit related to ulceration/compromised skin integrity Goals: Patient/caregiver will verbalize understanding of skin care regimen Date Initiated: 05/07/2022 Date Inactivated: 05/14/2022 Target Resolution Date: 06/05/2022 Goal Status: Met Ulcer/skin breakdown will have a volume reduction of 30% by week 4 Date Initiated: 05/07/2022 Target Resolution Date: 06/05/2022 Goal Status: Active Ulcer/skin breakdown will have a volume reduction of 50% by week 8 Date Initiated: 05/07/2022 Target Resolution Date: 07/06/2022 Goal Status: Active Ulcer/skin breakdown will have a  volume reduction of 80% by week 12 Date Initiated: 05/07/2022 Target Resolution Date: 08/05/2022 Goal Status: Active Ulcer/skin breakdown will heal within 14 weeks Date Initiated: 05/07/2022 Target Resolution Date: 09/05/2022 Goal Status: Active Interventions: Assess patient/caregiver ability to obtain necessary supplies Assess patient/caregiver ability to perform ulcer/skin care regimen upon admission and as needed Assess ulceration(s) every visit Notes: Electronic Signature(s) Signed: 06/25/2022 5:24:57 PM By: Levora Dredge Signed: 06/26/2022 4:53:44 PM By: Massie Kluver Entered By: Massie Kluver on 06/25/2022 16:31:14 -------------------------------------------------------------------------------- Pain Assessment Details Patient Name: Date of Service: [name], [name] 06/25/2022 3:45 PM Medical Record Number: [id number] Patient Account Number: [id number] Date of Birth/Sex: Treating RN: [birth year] (89 y.o. Valetta Close [place], [place] Carlean Jews ([id number]) [phone].pdf Page 5 of 7 Primary Care Ima Hafner: [name] Other Clinician: Massie Kluver Referring Heydi Swango: Treating Goldye Tourangeau/Extender: Jeri Cos [name], [name] in Treatment: 7 Active Problems Location of Pain Severity and Description of Pain Patient Has Paino No Site Locations Pain Management and Medication Current Pain Management: Electronic Signature(s) Signed: 06/25/2022 5:24:57 PM By: Levora Dredge Signed: 06/26/2022 4:53:44 PM By: Massie Kluver Entered By: Massie Kluver on 06/25/2022 15:46:56 -------------------------------------------------------------------------------- Patient/Caregiver Education Details Patient Name: Date of Service: [name], DEA NNA Carlean Jews 4/1/2024andnbsp3:45 PM Medical Record Number: [id number] Patient Account Number: [id number] Date of Birth/Gender: Treating RN: Apr 24, 1933 (89 y.o. Valetta Close Primary Care Physician:  [name] Other Clinician: Massie Kluver Referring Physician: Treating Physician/Extender: Lance Coon in Treatment: 7 Education Assessment Education Provided To: Patient Education Topics Provided Wound/Skin Impairment: Handouts: Other: continue wound care as directed Methods: Explain/Verbal Responses: State content correctly Electronic Signature(s) Signed: 06/26/2022 4:53:44 PM By: Massie Kluver Entered By: Massie Kluver on 06/25/2022 16:31:36 -------------------------------------------------------------------------------- Wound Assessment Details Patient Name: Date of Service: [name], [name] 06/25/2022 3:45 PM Medical Record Number: [id number] Patient Account Number: [id number] Date of Birth/Sex: Treating RN: [birth year] (89 y.o. Valetta Close [place], [place] Carlean Jews ([id number]) [phone].pdf Page 6 of 7 Primary Care Tej Murdaugh: [name] Other Clinician: Massie Kluver Referring Justyn Langham: Treating Yajahira Tison/Extender: Jeri Cos [name], [name] in Treatment: 7 Wound Status Wound Number: 9 Primary Diabetic Wound/Ulcer of the Lower Extremity Etiology: Wound Location: Right T Second oe Wound Open Wounding Event: Gradually Appeared Status: Date Acquired: 01/24/2022 Comorbid Chronic Obstructive Pulmonary Disease (COPD), Congestive Heart Weeks Of Treatment: 7 History: Failure, Coronary Artery Disease, Hypertension, Peripheral Arterial Clustered Wound: No Disease, Peripheral Venous Disease, Type II Diabetes, Rheumatoid Arthritis, Neuropathy Photos Wound Measurements Length: (cm) 0.5 Width: (cm) 0.6 Depth: (cm) 0.2 Area: (cm) 0.236 Volume: (cm) 0.047 % Reduction in Area: -87.3% % Reduction in Volume: -23.7% Epithelialization: None Wound Description Classification: Grade 2 Exudate Amount: Medium Exudate Type: Serosanguineous Exudate Color: red, brown Foul Odor  After Cleansing: No Slough/Fibrino Yes Wound Bed Granulation Amount: None  Present (0%) Exposed Structure Necrotic Amount: Large (67-100%) Fascia Exposed: No Necrotic Quality: Adherent Slough Fat Layer (Subcutaneous Tissue) Exposed: Yes Tendon Exposed: No Muscle Exposed: No Joint Exposed: No Bone Exposed: No Treatment Notes Wound #9 (Toe Second) Wound Laterality: Right Cleanser Soap and Water Discharge Instruction: Gently cleanse wound with antibacterial soap, rinse and pat dry prior to dressing wounds Peri-Wound Care Topical Primary Dressing Prisma 4.34 (in) Discharge Instruction: Moisten w/normal saline or sterile water; Cover wound as directed. Do not remove from wound bed. Secondary Dressing Coverlet Latex-Free Fabric Adhesive Dressings Discharge Instruction: 1.5 x 2 Secured With Compression Wrap Compression Stockings FINLEIGH, VANCLEAVE ([id number]) [phone].pdf Page 7 of 7 Add-Ons Electronic Signature(s) Signed: 06/25/2022 5:24:57 PM By: Levora Dredge Signed: 06/26/2022 4:53:44 PM By: Massie Kluver Entered By: Massie Kluver on 06/25/2022 15:54:12 -------------------------------------------------------------------------------- Vitals Details Patient Name: Date of Service: [name], [name] 06/25/2022 3:45 PM Medical Record Number: [id number] Patient Account Number: [id number] Date of Birth/Sex: Treating RN: 1933-11-11 (89 y.o. Valetta Close Primary Care Johnanthony Wilden: [name] Other Clinician: Massie Kluver Referring Chao Blazejewski: Treating Decarlo Rivet/Extender: Jeri Cos [name], [name] in Treatment: 7 Vital Signs Time Taken: 15:43 Temperature (F): 97.8 Height (in): 64 Pulse (bpm): 68 Weight (lbs): 180 Respiratory Rate (breaths/min): 18 Body Mass Index (BMI): 30.9 Blood Pressure (mmHg): 122/52 Reference Range: 80 - 120 mg / dl Electronic Signature(s) Signed: 06/26/2022 4:53:44 PM By: Massie Kluver Entered  By: Massie Kluver on 06/25/2022 15:46:52

## 2022-07-09 ENCOUNTER — Encounter: Payer: Medicare HMO | Admitting: Physician Assistant

## 2022-07-09 ENCOUNTER — Ambulatory Visit: Payer: Medicare HMO | Admitting: Cardiology

## 2022-07-09 DIAGNOSIS — E11621 Type 2 diabetes mellitus with foot ulcer: Secondary | ICD-10-CM | POA: Diagnosis not present

## 2022-07-11 NOTE — Progress Notes (Signed)
CHAYANNE, FILIPPI ([id number]) [id number] Page 1 of 7 Visit Report for 07/09/2022 Chief Complaint Document Details Patient Name: Date of Service: Thurston Pounds NNA L. 07/09/2022 3:30 PM Medical Record Number: [id number] Patient Account Number: [id number] Date of Birth/Sex: Treating RN: [birth year] (89 y.o. Ginette Pitman Primary Care Provider: Ronnald Ramp Other Clinician: Referring Provider: Treating Provider/Extender: Patric Dykes Weeks in Treatment: 9 Information Obtained from: Patient Chief Complaint Right second toe ulcer Electronic Signature(s) Signed: 07/09/2022 4:24:30 PM By: Allen Derry PA-C Entered By: Allen Derry on 07/09/2022 16:24:29 -------------------------------------------------------------------------------- Debridement Details Patient Name: Date of Service: [name], DEA NNA L. 07/09/2022 3:30 PM Medical Record Number: [id number] Patient Account Number: [id number] Date of Birth/Sex: Treating RN: 1934-02-06 (89 y.o. Ginette Pitman Primary Care Provider: Ronnald Ramp Other Clinician: Referring Provider: Treating Provider/Extender: Patric Dykes Weeks in Treatment: 9 Debridement Performed for Assessment: Wound #9 Right T Second oe Performed By: Physician Allen Derry, PA-C Debridement Type: Debridement Severity of Tissue Pre Debridement: Fat layer exposed Level of Consciousness (Pre-procedure): Awake and Alert Pre-procedure Verification/Time Out Yes - 15:50 Taken: Start Time: 15:50 Pain Control: Lidocaine 4% T opical Solution T Area Debrided (L x W): otal 0.6 (cm) x 0.6 (cm) = 0.36 (cm) Tissue and other material debrided: Viable, Non-Viable, Slough, Subcutaneous, Slough Level: Skin/Subcutaneous Tissue Debridement Description: Excisional Instrument: Curette Bleeding: Minimum Hemostasis Achieved: Pressure End Time: 15:51 Procedural Pain: 3 Post Procedural Pain:  3 Response to Treatment: Procedure was tolerated well Level of Consciousness (Post- Awake and Alert procedure): Post Debridement Measurements of Total Wound Length: (cm) 0.6 Width: (cm) 0.6 Depth: (cm) 0.3 Volume: (cm) 0.085 Character of Wound/Ulcer Post Debridement: Stable Severity of Tissue Post Debridement: Fat layer exposed Post Procedure Diagnosis Same as Pre-procedure Electronic Signature(s) Signed: 07/10/2022 8:44:52 AM By: Allen Derry PA-C Signed: 07/11/2022 8:21:01 AM By: Midge Aver MSN RN CNS WTA Entered By: Midge Aver on 07/09/2022 15:51:18 [name] ([id number]) [id number] Page 2 of 7 -------------------------------------------------------------------------------- HPI Details Patient Name: Date of Service: Thurston Pounds NNA L. 07/09/2022 3:30 PM Medical Record Number: [id number] Patient Account Number: [id number] Date of Birth/Sex: Treating RN: Sep 22, 1933 (89 y.o. Ginette Pitman Primary Care Provider: Ronnald Ramp Other Clinician: Referring Provider: Treating Provider/Extender: Patric Dykes Weeks in Treatment: 9 History of Present Illness HPI Description: 02-06-2022 upon evaluation today patient presents for initial inspection here in our clinic concerning wounds that she has over the bilateral feet as well as the right leg. She has been tolerating the dressing changes in general here without complication she has been putting antibiotic ointment on this. With that being said I do believe that she would benefit from the use of something like Xeroform gauze dressings which would likely be a bit better. Fortunately I do not see any signs of infection which is good news do not think she needs any antibiotics. She tells me that these wounds typically occur as a result of her bumping or hitting her leg and then they slowly progressed from there. Patient does have a history of diabetes for which her  hemoglobin A1c was 6.9 measured on 12-12-2021. She also has a history of arterial insufficiency with a right ABI of 0.66 with a TBI of 0.61 and a left ABI of 0.82 with a TBI of 0.45. She has a follow-up evaluation on 02-27-2022 with vascular. This testing was done on 01-28-2022. Subsequently the patient also has a history of hypertension, generalized weakness, congestive heart failure, and lower extremity edema.  02-13-2022 upon evaluation today patient appears to be doing well currently in regard to her wounds. She is actually making some good progress here which is great news and overall I am extremely pleased with where we stand. I do not see any evidence of worsening. 02-22-2022 upon evaluation today patient's wounds actually are showing some signs of improvement for the most part. Fortunately there does not appear to be any evidence of infection locally nor systemically at this point which is great news. No fevers, chills, nausea, vomiting, or diarrhea. She does have an a vascular appointment on December 5 and we will see what they have to say at that point. 03-08-2022 upon evaluation today patient actually appears to be doing excellent in regards to her wounds. She has been tolerating the dressing changes without complication. Fortunately there is no sign of active infection locally nor systemically at this point which is great news. Readmission: 05-07-2022 upon evaluation today patient presents for reevaluation here in the clinic it has been actually December 14 since I have last seen her. Subsequently she does still have a wound on the right second toe. This is the one area that has not healed everything on the left ended up healing. Fortunately I do not see any signs of infection at this point and really nothing is changed in her medical history since the last evaluation. She just tells me that this wound is just being stubborn and does not want to heal. 05-14-2022 upon evaluation today patient  appears to be doing poorly in regard to her toe ulcer. Unfortunately she is showing signs of this being a little worse than last week. I was hopeful that this would actually show some signs of improvement but were definitely not seeing that. I think we probably need to switch to a different dressing, recommend Iodosorb/Iodoflex as an option here for her. 05-21-2022 upon evaluation today patient appears to be doing well currently in regard to her toe ulcer. I did review her culture as well as her x-ray both are negative at this point. There is no growth after 3 days. With that being said that she has been exposed this actually is looking much better Is read as it was and overall much happier with where things stand currently. I do not see any signs of infection locally nor systemically at this point. 3/4; this is a patient with a punched out area on the dorsal aspect of her right second toe in the inner phalangeal joint area. She had a previous 1 that healed on the left second toe. She saw Dr. Gilda Crease at [facility] vascular. He did not feel she required any surgical procedure suggested venous. We have been using Iodoflex 3/12; patient presents for follow-up. She has been using Iodoflex to the second right toe. She has no issues or complaints today. 06-12-2022 upon evaluation today patient appears to be doing well currently in regard to her wound. She actually seems to be making some progress the Iodoflex seems to be doing a pretty good job here. Fortunately I do not see any evidence of active infection locally nor systemically which is great news. No fevers, chills, nausea, vomiting, or diarrhea. 06-19-2022 upon evaluation today patient appears to be doing well currently in regard to her toe ulcer actually feel like this is getting cleaner and I am very pleased in that regard. I do not see any signs of infection locally or systemically. 06-25-2022 upon evaluation today patient appears to be doing well  currently in  regard to her wound. She is going require little bit of sharp debridement but fortunately I am seeing some definite improvements here. I do not see any signs of infection locally nor systemically which is great news. No fevers, chills, nausea, vomiting, or diarrhea. 07-09-2022 upon evaluation today patient appears to be doing well currently in regard to her wound. She is actually showing signs of improvement I think the collagen is doing a good job. I do not see any signs of active infection locally nor systemically at this point. No fevers, chills, nausea, vomiting, or diarrhea. Electronic Signature(s) Signed: 07/09/2022 4:25:04 PM By: Allen Derry PA-C Entered By: Allen Derry on 07/09/2022 16:25:04 -------------------------------------------------------------------------------- Physical Exam Details Patient Name: Date of Service: [name], DEA NNA L. 07/09/2022 3:30 PM Medical Record Number: [id number] Patient Account Number: [id number] Date of Birth/Sex: Treating RN: 29-Aug-1933 (89 y.o. Ginette Pitman Primary Care Provider: Ronnald Ramp Other Clinician: YAILIN, SWEAZY ([id number]) [id number] Page 3 of 7 Referring Provider: Treating Provider/Extender: [name], [name] in Treatment: 9 Constitutional Well-nourished and well-hydrated in no acute distress. Respiratory normal breathing without difficulty. Psychiatric this patient is able to make decisions and demonstrates good insight into disease process. Alert and Oriented x 3. pleasant and cooperative. Notes Upon inspection patient's wound bed actually showed signs of good granulation and epithelization at this point. Fortunately I do not see any evidence of worsening overall which is great news and I am very pleased in that regard. Electronic Signature(s) Signed: 07/09/2022 4:25:21 PM By: Allen Derry PA-C Entered By: Allen Derry on 07/09/2022  16:25:21 -------------------------------------------------------------------------------- Physician Orders Details Patient Name: Date of Service: [name], DEA NNA L. 07/09/2022 3:30 PM Medical Record Number: [id number] Patient Account Number: [id number] Date of Birth/Sex: Treating RN: 04-24-1933 (89 y.o. Ginette Pitman Primary Care Provider: Ronnald Ramp Other Clinician: Referring Provider: Treating Provider/Extender: [name] in Treatment: 9 Verbal / Phone Orders: No Diagnosis Coding Follow-up Appointments Return Appointment in 1 week. Bathing/ Shower/ Hygiene May shower; gently cleanse wound with antibacterial soap, rinse and pat dry prior to dressing wounds Anesthetic (Use 'Patient Medications' Section for Anesthetic Order Entry) Lidocaine applied to wound bed Edema Control - Lymphedema / Segmental Compressive Device / Other Elevate, Exercise Daily and A void Standing for Long Periods of Time. Elevate legs to the level of the heart and pump ankles as often as possible Elevate leg(s) parallel to the floor when sitting. DO YOUR BEST to sleep in the bed at night. DO NOT sleep in your recliner. Long hours of sitting in a recliner leads to swelling of the legs and/or potential wounds on your backside. Wound Treatment Wound #9 - T Second oe Wound Laterality: Right Cleanser: Soap and Water 3 x Per Week/30 Days Discharge Instructions: Gently cleanse wound with antibacterial soap, rinse and pat dry prior to dressing wounds Prim Dressing: Prisma 4.34 (in) 3 x Per Week/30 Days ary Discharge Instructions: Moisten w/normal saline or sterile water; Cover wound as directed. Do not remove from wound bed. Secondary Dressing: Coverlet Latex-Free Fabric Adhesive Dressings 3 x Per Week/30 Days Discharge Instructions: 1.5 x 2 Electronic Signature(s) Signed: 07/10/2022 8:44:52 AM By: Allen Derry PA-C Signed: 07/11/2022 8:21:01 AM By: Midge Aver MSN RN  CNS WTA Entered By: Midge Aver on 07/09/2022 15:51:41 -------------------------------------------------------------------------------- Problem List Details Patient Name: Date of Service: [name], DEA NNA L. 07/09/2022 3:30 PM [name] ([id number]) [id number] Page 4 of 7 Medical Record Number: [id number] Patient Account Number: [id number] Date  of Birth/Sex: Treating RN: June 23, 1933 (89 y.o. Ginette Pitman Primary Care Provider: Ronnald Ramp Other Clinician: Referring Provider: Treating Provider/Extender: Patric Dykes Weeks in Treatment: 9 Active Problems ICD-10 Encounter Code Description Active Date MDM Diagnosis E11.621 Type 2 diabetes mellitus with foot ulcer 05/07/2022 No Yes I73.89 Other specified peripheral vascular diseases 05/07/2022 No Yes L97.512 Non-pressure chronic ulcer of other part of right foot with fat layer exposed 05/07/2022 No Yes M62.81 Muscle weakness (generalized) 05/07/2022 No Yes I50.42 Chronic combined systolic (congestive) and diastolic (congestive) heart failure 05/07/2022 No Yes I10 Essential (primary) hypertension 05/07/2022 No Yes Inactive Problems Resolved Problems Electronic Signature(s) Signed: 07/09/2022 4:24:27 PM By: Allen Derry PA-C Entered By: Allen Derry on 07/09/2022 16:24:27 -------------------------------------------------------------------------------- Progress Note Details Patient Name: Date of Service: [name], DEA NNA L. 07/09/2022 3:30 PM Medical Record Number: [id number] Patient Account Number: [id number] Date of Birth/Sex: Treating RN: 1933-08-30 (89 y.o. Ginette Pitman Primary Care Provider: Ronnald Ramp Other Clinician: Referring Provider: Treating Provider/Extender: Patric Dykes Weeks in Treatment: 9 Subjective Chief Complaint Information obtained from Patient Right second toe ulcer History of Present Illness  (HPI) 02-06-2022 upon evaluation today patient presents for initial inspection here in our clinic concerning wounds that she has over the bilateral feet as well as the right leg. She has been tolerating the dressing changes in general here without complication she has been putting antibiotic ointment on this. With that being said I do believe that she would benefit from the use of something like Xeroform gauze dressings which would likely be a bit better. Fortunately I do not see any signs of infection which is good news do not think she needs any antibiotics. She tells me that these wounds typically occur as a result of her bumping or hitting her leg and then they slowly progressed from there. Patient does have a history of diabetes for which her hemoglobin A1c was 6.9 measured on 12-12-2021. She also has a history of arterial insufficiency with a right ABI of 0.66 with a TBI of 0.61 and a left ABI of 0.82 with a TBI of 0.45. She has a follow-up evaluation on 02-27-2022 with vascular. This testing was done on 01-28-2022. Subsequently the patient also has a history of hypertension, generalized weakness, congestive heart failure, and lower extremity edema. 02-13-2022 upon evaluation today patient appears to be doing well currently in regard to her wounds. She is actually making some good progress here which is great news and overall I am extremely pleased with where we stand. I do not see any evidence of worsening. MAURI, TEMKIN ([id number]) [id number] Page 5 of 7 02-22-2022 upon evaluation today patient's wounds actually are showing some signs of improvement for the most part. Fortunately there does not appear to be any evidence of infection locally nor systemically at this point which is great news. No fevers, chills, nausea, vomiting, or diarrhea. She does have an a vascular appointment on December 5 and we will see what they have to say at that point. 03-08-2022 upon  evaluation today patient actually appears to be doing excellent in regards to her wounds. She has been tolerating the dressing changes without complication. Fortunately there is no sign of active infection locally nor systemically at this point which is great news. Readmission: 05-07-2022 upon evaluation today patient presents for reevaluation here in the clinic it has been actually December 14 since I have last seen her. Subsequently she does still have a wound on the right second toe. This  is the one area that has not healed everything on the left ended up healing. Fortunately I do not see any signs of infection at this point and really nothing is changed in her medical history since the last evaluation. She just tells me that this wound is just being stubborn and does not want to heal. 05-14-2022 upon evaluation today patient appears to be doing poorly in regard to her toe ulcer. Unfortunately she is showing signs of this being a little worse than last week. I was hopeful that this would actually show some signs of improvement but were definitely not seeing that. I think we probably need to switch to a different dressing, recommend Iodosorb/Iodoflex as an option here for her. 05-21-2022 upon evaluation today patient appears to be doing well currently in regard to her toe ulcer. I did review her culture as well as her x-ray both are negative at this point. There is no growth after 3 days. With that being said that she has been exposed this actually is looking much better Is read as it was and overall much happier with where things stand currently. I do not see any signs of infection locally nor systemically at this point. 3/4; this is a patient with a punched out area on the dorsal aspect of her right second toe in the inner phalangeal joint area. She had a previous 1 that healed on the left second toe. She saw Dr. Gilda Crease at vein and vascular. He did not feel she required any surgical procedure  suggested venous. We have been using Iodoflex 3/12; patient presents for follow-up. She has been using Iodoflex to the second right toe. She has no issues or complaints today. 06-12-2022 upon evaluation today patient appears to be doing well currently in regard to her wound. She actually seems to be making some progress the Iodoflex seems to be doing a pretty good job here. Fortunately I do not see any evidence of active infection locally nor systemically which is great news. No fevers, chills, nausea, vomiting, or diarrhea. 06-19-2022 upon evaluation today patient appears to be doing well currently in regard to her toe ulcer actually feel like this is getting cleaner and I am very pleased in that regard. I do not see any signs of infection locally or systemically. 06-25-2022 upon evaluation today patient appears to be doing well currently in regard to her wound. She is going require little bit of sharp debridement but fortunately I am seeing some definite improvements here. I do not see any signs of infection locally nor systemically which is great news. No fevers, chills, nausea, vomiting, or diarrhea. 07-09-2022 upon evaluation today patient appears to be doing well currently in regard to her wound. She is actually showing signs of improvement I think the collagen is doing a good job. I do not see any signs of active infection locally nor systemically at this point. No fevers, chills, nausea, vomiting, or diarrhea. Objective Constitutional Well-nourished and well-hydrated in no acute distress. Vitals Time Taken: 3:33 PM, Height: 64 in, Weight: 180 lbs, BMI: 30.9, Temperature: 98.2 F, Pulse: 85 bpm, Respiratory Rate: 18 breaths/min, Blood Pressure: 111/64 mmHg. Respiratory normal breathing without difficulty. Psychiatric this patient is able to make decisions and demonstrates good insight into disease process. Alert and Oriented x 3. pleasant and cooperative. General Notes: Upon inspection  patient's wound bed actually showed signs of good granulation and epithelization at this point. Fortunately I do not see any evidence of worsening overall which is great news  and I am very pleased in that regard. Integumentary (Hair, Skin) Wound #9 status is Open. Original cause of wound was Gradually Appeared. The date acquired was: 01/24/2022. The wound has been in treatment 9 weeks. The wound is located on the Right T Second. The wound measures 0.6cm length x 0.6cm width x 0.2cm depth; 0.283cm^2 area and 0.057cm^3 volume. There is oe Fat Layer (Subcutaneous Tissue) exposed. There is a medium amount of serosanguineous drainage noted. There is no granulation within the wound bed. There is a large (67-100%) amount of necrotic tissue within the wound bed. Assessment Active Problems ICD-10 Type 2 diabetes mellitus with foot ulcer Other specified peripheral vascular diseases Non-pressure chronic ulcer of other part of right foot with fat layer exposed Muscle weakness (generalized) Chronic combined systolic (congestive) and diastolic (congestive) heart failure Essential (primary) hypertension Himes, Man L ([id number]) [id number] Page 6 of 7 Procedures Wound #9 Pre-procedure diagnosis of Wound #9 is a Diabetic Wound/Ulcer of the Lower Extremity located on the Right T Second .Severity of Tissue Pre Debridement oe is: Fat layer exposed. There was a Excisional Skin/Subcutaneous Tissue Debridement with a total area of 0.36 sq cm performed by Allen Derry, PA-C. With the following instrument(s): Curette to remove Viable and Non-Viable tissue/material. Material removed includes Subcutaneous Tissue and Slough and after achieving pain control using Lidocaine 4% T opical Solution. No specimens were taken. A time out was conducted at 15:50, prior to the start of the procedure. A Minimum amount of bleeding was controlled with Pressure. The procedure was tolerated well with a  pain level of 3 throughout and a pain level of 3 following the procedure. Post Debridement Measurements: 0.6cm length x 0.6cm width x 0.3cm depth; 0.085cm^3 volume. Character of Wound/Ulcer Post Debridement is stable. Severity of Tissue Post Debridement is: Fat layer exposed. Post procedure Diagnosis Wound #9: Same as Pre-Procedure Plan Follow-up Appointments: Return Appointment in 1 week. Bathing/ Shower/ Hygiene: May shower; gently cleanse wound with antibacterial soap, rinse and pat dry prior to dressing wounds Anesthetic (Use 'Patient Medications' Section for Anesthetic Order Entry): Lidocaine applied to wound bed Edema Control - Lymphedema / Segmental Compressive Device / Other: Elevate, Exercise Daily and Avoid Standing for Long Periods of Time. Elevate legs to the level of the heart and pump ankles as often as possible Elevate leg(s) parallel to the floor when sitting. DO YOUR BEST to sleep in the bed at night. DO NOT sleep in your recliner. Long hours of sitting in a recliner leads to swelling of the legs and/or potential wounds on your backside. WOUND #9: - T Second Wound Laterality: Right oe Cleanser: Soap and Water 3 x Per Week/30 Days Discharge Instructions: Gently cleanse wound with antibacterial soap, rinse and pat dry prior to dressing wounds Prim Dressing: Prisma 4.34 (in) 3 x Per Week/30 Days ary Discharge Instructions: Moisten w/normal saline or sterile water; Cover wound as directed. Do not remove from wound bed. Secondary Dressing: Coverlet Latex-Free Fabric Adhesive Dressings 3 x Per Week/30 Days Discharge Instructions: 1.5 x 2 1. I would suggest that we have the patient continue to monitor for any signs of infection or worsening. Based on what I am seeing I do believe that the patient is doing well from the standpoint of infection I do not see anything that looks to be infected do not think she needs an MRI yet. Will keep a close eye on this however. 2. Would not  continue with Prisma which I think is still probably the best option  here. We will see patient back for reevaluation in 1 week here in the clinic. If anything worsens or changes patient will contact our office for additional recommendations. Electronic Signature(s) Signed: 07/09/2022 4:25:47 PM By: Allen Derry PA-C Entered By: Allen Derry on 07/09/2022 16:25:47 -------------------------------------------------------------------------------- SuperBill Details Patient Name: Date of Service: [name], DEA NNA L. 07/09/2022 Medical Record Number: [id number] Patient Account Number: [id number] Date of Birth/Sex: Treating RN: 09/22/1933 (89 y.o. Ginette Pitman Primary Care Provider: Ronnald Ramp Other Clinician: Referring Provider: Treating Provider/Extender: Patric Dykes Weeks in Treatment: 9 Diagnosis Coding ICD-10 Codes Code Description E11.621 Type 2 diabetes mellitus with foot ulcer I73.89 Other specified peripheral vascular diseases L97.512 Non-pressure chronic ulcer of other part of right foot with fat layer exposed M62.81 Muscle weakness (generalized) Butler, Shaquayla L ([id number]) [id number] Page 7 of 7 I50.42 Chronic combined systolic (congestive) and diastolic (congestive) heart failure I10 Essential (primary) hypertension Facility Procedures : CPT4 Code: 78295621 Description: 11042 - DEB SUBQ TISSUE 20 SQ CM/< ICD-10 Diagnosis Description L97.512 Non-pressure chronic ulcer of other part of right foot with fat layer exposed Modifier: Quantity: 1 Physician Procedures : CPT4 Code Description Modifier [id number] 11042 - WC PHYS SUBQ TISS 20 SQ CM ICD-10 Diagnosis Description L97.512 Non-pressure chronic ulcer of other part of right foot with fat layer exposed Quantity: 1 Electronic Signature(s) Signed: 07/09/2022 4:37:27 PM By: Allen Derry PA-C Entered By: Allen Derry on 07/09/2022 16:37:27

## 2022-07-11 NOTE — Progress Notes (Signed)
DAKIRA, BABAYEV ([id number]) [id number] Page 1 of 7 Visit Report for 07/09/2022 Arrival Information Details Patient Name: Date of Service: [name], [place] NNA L. 07/09/2022 3:30 PM Medical Record Number: [id number] Patient Account Number: [id number] Date of Birth/Sex: Treating RN: 06-Jan-1934 (89 y.o. Ginette Pitman Primary Care Keiri Solano: Ronnald Ramp Other Clinician: Referring Julann Mcgilvray: Treating Taeshaun Rames/Extender: [name] in Treatment: 9 Visit Information History Since Last Visit Added or deleted any medications: No Patient Arrived: Wheel Chair Has Dressing in Place as Prescribed: Yes Arrival Time: 15:31 Pain Present Now: No Accompanied By: self Transfer Assistance: None Patient Identification Verified: Yes Secondary Verification Process Completed: Yes Patient Requires Transmission-Based Precautions: No Patient Has Alerts: Yes Patient Alerts: ABI L .82 02/06/22 ABI R .66 02/06/22 Electronic Signature(s) Signed: 07/11/2022 8:21:01 AM By: Midge Aver MSN RN CNS WTA Entered By: Midge Aver on 07/09/2022 15:33:30 -------------------------------------------------------------------------------- Clinic Level of Care Assessment Details Patient Name: Date of Service: [name], DEA NNA L. 07/09/2022 3:30 PM Medical Record Number: [id number] Patient Account Number: [id number] Date of Birth/Sex: Treating RN: [birth year] (89 y.o. Ginette Pitman Primary Care Christropher Gintz: Ronnald Ramp Other Clinician: Referring Evellyn Tuff: Treating Tarrin Menn/Extender: Patric Dykes Weeks in Treatment: 9 Clinic Level of Care Assessment Items TOOL 1 Quantity Score []  - 0 Use when EandM and Procedure is performed on INITIAL visit ASSESSMENTS - Nursing Assessment / Reassessment []  - 0 General Physical Exam (combine w/ comprehensive assessment (listed just below) when performed on new pt. evals) []  -  0 Comprehensive Assessment (HX, ROS, Risk Assessments, Wounds Hx, etc.) ASSESSMENTS - Wound and Skin Assessment / Reassessment []  - 0 Dermatologic / Skin Assessment (not related to wound area) ASSESSMENTS - Ostomy and/or Continence Assessment and Care []  - 0 Incontinence Assessment and Management []  - 0 Ostomy Care Assessment and Management (repouching, etc.) PROCESS - Coordination of Care []  - 0 Simple Patient / Family Education for ongoing care []  - 0 Complex (extensive) Patient / Family Education for ongoing care []  - 0 Staff obtains Chiropractor, Records, T Results / Process Orders est []  - 0 Staff telephones HHA, Nursing Homes / Clarify orders / etc []  - 0 Routine Transfer to another Facility (non-emergent condition) []  - 0 Routine Hospital Admission (non-emergent condition) []  - 0 New Admissions / Insurance Authorizations / Ordering NPWT Apligraf, etc. , []  - 0 Emergency Hospital Admission (emergent condition) Mazurek, Keisy L ([id number]) [id number] Page 2 of 7 PROCESS - Special Needs []  - 0 Pediatric / Minor Patient Management []  - 0 Isolation Patient Management []  - 0 Hearing / Language / Visual special needs []  - 0 Assessment of Community assistance (transportation, D/C planning, etc.) []  - 0 Additional assistance / Altered mentation []  - 0 Support Surface(s) Assessment (bed, cushion, seat, etc.) INTERVENTIONS - Miscellaneous []  - 0 External ear exam []  - 0 Patient Transfer (multiple staff / Nurse, adult / Similar devices) []  - 0 Simple Staple / Suture removal (25 or less) []  - 0 Complex Staple / Suture removal (26 or more) []  - 0 Hypo/Hyperglycemic Management (do not check if billed separately) []  - 0 Ankle / Brachial Index (ABI) - do not check if billed separately Has the patient been seen at the hospital within the last three years: Yes Total Score: 0 Level Of Care: ____ Electronic Signature(s) Signed: 07/11/2022 8:21:01 AM  By: Midge Aver MSN RN CNS WTA Entered By: Midge Aver on 07/09/2022 15:51:47 -------------------------------------------------------------------------------- Encounter Discharge Information Details Patient Name: Date of Service: [name], DEA  NNA L. 07/09/2022 3:30 PM Medical Record Number: [id number] Patient Account Number: [id number] Date of Birth/Sex: Treating RN: 06-12-1933 (89 y.o. Ginette Pitman Primary Care Michaelann Gunnoe: Ronnald Ramp Other Clinician: Referring Masashi Snowdon: Treating Ivet Guerrieri/Extender: [name] in Treatment: 9 Encounter Discharge Information Items Post Procedure Vitals Discharge Condition: Stable Temperature (F): 98.2 Ambulatory Status: Wheelchair Pulse (bpm): 85 Discharge Destination: Home Respiratory Rate (breaths/min): 16 Transportation: Private Auto Blood Pressure (mmHg): 111/64 Accompanied By: son Schedule Follow-up Appointment: Yes Clinical Summary of Care: Electronic Signature(s) Signed: 07/11/2022 8:21:01 AM By: Midge Aver MSN RN CNS WTA Entered By: Midge Aver on 07/09/2022 16:05:04 -------------------------------------------------------------------------------- Lower Extremity Assessment Details Patient Name: Date of Service: [name], DE[place] NNA L. 07/09/2022 3:30 PM Medical Record Number: [id number] Patient Account Number: [id number] Date of Birth/Sex: Treating RN: May 04, 1933 (89 y.o. Ginette Pitman Primary Care Novah Nessel: Ronnald Ramp Other Clinician: Referring Kiara Mcdowell: Treating Tykia Mellone/Extender: [name], [name] in Treatment: 9 Edema Assessment Assessed: [Left: No] [Right: No] Edema: [Left: N] [Right: o] H[LeftCLEMMA, JOHNSEN L ([id number])] [Right: [id number] Page 3 of 7] Calf Left: Right: Point of Measurement: 32 cm From Medial Instep 35 cm Ankle Left: Right: Point of Measurement: 10 cm From Medial Instep 21.4 cm Vascular  Assessment Pulses: Dorsalis Pedis Palpable: [Right:Yes] Electronic Signature(s) Signed: 07/11/2022 8:21:01 AM By: Midge Aver MSN RN CNS WTA Entered By: Midge Aver on 07/09/2022 15:41:00 -------------------------------------------------------------------------------- Multi Wound Chart Details Patient Name: Date of Service: [name], DEA NNA L. 07/09/2022 3:30 PM Medical Record Number: [id number] Patient Account Number: [id number] Date of Birth/Sex: Treating RN: 21-Jul-1933 (89 y.o. Ginette Pitman Primary Care Betta Balla: Ronnald Ramp Other Clinician: Referring Shaindel Sweeten: Treating Khilynn Borntreger/Extender: [name], [name] in Treatment: 9 Vital Signs Height(in): 64 Pulse(bpm): 85 Weight(lbs): 180 Blood Pressure(mmHg): 111/64 Body Mass Index(BMI): 30.9 Temperature(F): 98.2 Respiratory Rate(breaths/min): 18 [9:Photos:] [N/A:N/A] Right T Second oe N/A N/A Wound Location: Gradually Appeared N/A N/A Wounding Event: Diabetic Wound/Ulcer of the Lower N/A N/A Primary Etiology: Extremity Chronic Obstructive Pulmonary N/A N/A Comorbid History: Disease (COPD), Congestive Heart Failure, Coronary Artery Disease, Hypertension, Peripheral Arterial Disease, Peripheral Venous Disease, Type II Diabetes, Rheumatoid Arthritis, Neuropathy 01/24/2022 N/A N/A Date Acquired: 9 N/A N/A Weeks of Treatment: Open N/A N/A Wound Status: No N/A N/A Wound Recurrence: 0.6x0.6x0.2 N/A N/A Measurements L x W x D (cm) 0.283 N/A N/A A (cm) : rea 0.057 N/A N/A Volume (cm) : -124.60% N/A N/A % Reduction in A rea: -50.00% N/A N/A % Reduction in Volume: Grade 2 N/A N/A Classification: Medium N/A N/A Exudate A mount: Serosanguineous N/A N/A Exudate Type: red, brown N/A N/A Exudate Color: None Present (0%) N/A N/A Granulation A mount: Large (67-100%) N/A N/A Necrotic A mount: Fat Layer (Subcutaneous Tissue): Yes N/A N/A Exposed StructuresMARKEA, RUZICH L ([id number]) [id number] Page 4 of 7 Fascia: No Tendon: No Muscle: No Joint: No Bone: No None N/A N/A Epithelialization: Treatment Notes Electronic Signature(s) Signed: 07/11/2022 8:21:01 AM By: Midge Aver MSN RN CNS WTA Entered By: Midge Aver on 07/09/2022 15:43:02 -------------------------------------------------------------------------------- Multi-Disciplinary Care Plan Details Patient Name: Date of Service: [name], DEA NNA L. 07/09/2022 3:30 PM Medical Record Number: [id number] Patient Account Number: [id number] Date of Birth/Sex: Treating RN: [birth year] (89 y.o. Ginette Pitman Primary Care Tyquarius Paglia: Ronnald Ramp Other Clinician: Referring Jaycee Pelzer: Treating Kemo Spruce/Extender: Patric Dykes Weeks in Treatment: 9 Active Inactive Wound/Skin Impairment Nursing Diagnoses: Knowledge deficit related to ulceration/compromised skin integrity Goals: Patient/caregiver will verbalize understanding of skin care regimen Date  Initiated: 05/07/2022 Date Inactivated: 05/14/2022 Target Resolution Date: 06/05/2022 Goal Status: Met Ulcer/skin breakdown will have a volume reduction of 30% by week 4 Date Initiated: 05/07/2022 Date Inactivated: 07/09/2022 Target Resolution Date: 06/05/2022 Goal Status: Met Ulcer/skin breakdown will have a volume reduction of 50% by week 8 Date Initiated: 05/07/2022 Date Inactivated: 07/09/2022 Target Resolution Date: 07/06/2022 Goal Status: Met Ulcer/skin breakdown will have a volume reduction of 80% by week 12 Date Initiated: 05/07/2022 Target Resolution Date: 08/05/2022 Goal Status: Active Ulcer/skin breakdown will heal within 14 weeks Date Initiated: 05/07/2022 Target Resolution Date: 09/05/2022 Goal Status: Active Interventions: Assess patient/caregiver ability to obtain necessary supplies Assess patient/caregiver ability to perform ulcer/skin care regimen upon admission and as  needed Assess ulceration(s) every visit Notes: Electronic Signature(s) Signed: 07/11/2022 8:21:01 AM By: Midge Aver MSN RN CNS WTA Entered By: Midge Aver on 07/09/2022 15:52:12 -------------------------------------------------------------------------------- Pain Assessment Details Patient Name: Date of Service: [name], DE[place] NNA L. 07/09/2022 3:30 PM Medical Record Number: [id number] Patient Account Number: [id number] Date of Birth/Sex: Treating RN: [birth year] (89 y.o. Ginette Pitman Primary Care Reonna Finlayson: Ronnald Ramp Other Clinician: Referring Kamron Vanwyhe: Treating Lorna Strother/Extender: [name] in Treatment: [address] [place] L ([id number]) [id number] Page 5 of 7 Active Problems Location of Pain Severity and Description of Pain Patient Has Paino No Site Locations Pain Management and Medication Current Pain Management: Electronic Signature(s) Signed: 07/11/2022 8:21:01 AM By: Midge Aver MSN RN CNS WTA Entered By: Midge Aver on 07/09/2022 15:35:33 -------------------------------------------------------------------------------- Patient/Caregiver Education Details Patient Name: Date of Service: [name], DEA NNA Elbert Ewings 4/15/2024andnbsp3:30 PM Medical Record Number: [id number] Patient Account Number: [id number] Date of Birth/Gender: Treating RN: [birth year] (89 y.o. Ginette Pitman Primary Care Physician: Ronnald Ramp Other Clinician: Referring Physician: Treating Physician/Extender: [name] in Treatment: 9 Education Assessment Education Provided To: Patient Education Topics Provided Wound Debridement: Handouts: Wound Debridement Methods: Explain/Verbal Responses: State content correctly Wound/Skin Impairment: Handouts: Caring for Your Ulcer Methods: Explain/Verbal Responses: State content correctly Electronic Signature(s) Signed: 07/11/2022 8:21:01 AM By:  Midge Aver MSN RN CNS WTA Entered By: Midge Aver on 07/09/2022 15:52:31 -------------------------------------------------------------------------------- Wound Assessment Details Patient Name: Date of Service: [name], DEA NNA L. 07/09/2022 3:30 PM Medical Record Number: [id number] Patient Account Number: [id number] Date of Birth/Sex: Treating RN: 12-10-1933 (89 y.o. Aryaa, Bunting, Kerri Perches ([id number]) [phone].pdf Page 6 of 7 Primary Care Qaadir Kent: Ronnald Ramp Other Clinician: Referring Mekhi Sonn: Treating Billiejo Sorto/Extender: [name], [name] in Treatment: 9 Wound Status Wound Number: 9 Primary Diabetic Wound/Ulcer of the Lower Extremity Etiology: Wound Location: Right T Second oe Wound Open Wounding Event: Gradually Appeared Status: Date Acquired: 01/24/2022 Comorbid Chronic Obstructive Pulmonary Disease (COPD), Congestive Heart Weeks Of Treatment: 9 History: Failure, Coronary Artery Disease, Hypertension, Peripheral Arterial Clustered Wound: No Disease, Peripheral Venous Disease, Type II Diabetes, Rheumatoid Arthritis, Neuropathy Photos Wound Measurements Length: (cm) 0.6 Width: (cm) 0.6 Depth: (cm) 0.2 Area: (cm) 0.283 Volume: (cm) 0.057 % Reduction in Area: -124.6% % Reduction in Volume: -50% Epithelialization: None Wound Description Classification: Grade 2 Exudate Amount: Medium Exudate Type: Serosanguineous Exudate Color: red, brown Foul Odor After Cleansing: No Slough/Fibrino Yes Wound Bed Granulation Amount: None Present (0%) Exposed Structure Necrotic Amount: Large (67-100%) Fascia Exposed: No Fat Layer (Subcutaneous Tissue) Exposed: Yes Tendon Exposed: No Muscle Exposed: No Joint Exposed: No Bone Exposed: No Treatment Notes Wound #9 (Toe Second) Wound Laterality: Right Cleanser Soap and Water Discharge Instruction: Gently cleanse wound with antibacterial soap, rinse and pat  dry prior to dressing wounds Peri-Wound Care Topical Primary Dressing Prisma 4.34 (in) Discharge Instruction: Moisten w/normal saline or sterile water; Cover wound as directed. Do not remove from wound bed. Secondary Dressing Coverlet Latex-Free Fabric Adhesive Dressings Discharge Instruction: 1.5 x 2 Secured With Compression Wrap Compression Stockings ALISEA, MATTE ([id number]) [id number] Page 7 of 7 Add-Ons Electronic Signature(s) Signed: 07/11/2022 8:21:01 AM By: Midge Aver MSN RN CNS WTA Entered By: Midge Aver on 07/09/2022 15:40:52 -------------------------------------------------------------------------------- Vitals Details Patient Name: Date of Service: [name], DEA NNA L. 07/09/2022 3:30 PM Medical Record Number: [id number] Patient Account Number: [id number] Date of Birth/Sex: Treating RN: September 08, 1933 (89 y.o. Ginette Pitman Primary Care Jamare Vanatta: Ronnald Ramp Other Clinician: Referring Inara Dike: Treating Neftaly Swiss/Extender: [name], [name] in Treatment: 9 Vital Signs Time Taken: 15:33 Temperature (F): 98.2 Height (in): 64 Pulse (bpm): 85 Weight (lbs): 180 Respiratory Rate (breaths/min): 18 Body Mass Index (BMI): 30.9 Blood Pressure (mmHg): 111/64 Reference Range: 80 - 120 mg / dl Electronic Signature(s) Signed: 07/11/2022 8:21:01 AM By: Midge Aver MSN RN CNS WTA Entered By: Midge Aver on 07/09/2022 15:35:27

## 2022-07-12 DIAGNOSIS — I5020 Unspecified systolic (congestive) heart failure: Secondary | ICD-10-CM | POA: Insufficient documentation

## 2022-07-12 NOTE — Progress Notes (Signed)
Cardiology Office Note Date:  07/13/2022  Patient ID:  Kathryn, [name] 10-Oct-1933, MRN [id number] PCP:  Ronnald Ramp, MD  Cardiologist:  Julien Nordmann, MD Electrophysiologist: Lanier Prude, MD    Chief Complaint: 6 month follow-up  History of Present Illness: Kathryn [name] is a 89 y.o. female with PMH notable for PSVT, HFrEF, HTN, T2DM; seen today for Lanier Prude, MD for routine electrophysiology followup.  Last saw Dr. Lalla Brothers 12/2021. She had ran out of amiodarone about 1 month prior to appt, no SVT episodes. He lowered dose to .  She feels well today, continues to have difficulty ambulating.  Has had no palpitations episodes since last being seen.  She uses compression socks daily and lower extremity edema is well-controlled. Sleeps flat at night, good appetite.  She weighs herself daily, dry weight is 178-180lbs.   Her son accompanies her for visit; she lives with him. She has frequent dietary indiscretions, likes chips a lot.    she denies chest pain, palpitations, dyspnea, PND, orthopnea, nausea, vomiting, dizziness, syncope, edema, weight gain, or early satiety.     AAD History: Amiodarone   Past Medical History:  Diagnosis Date   Actinic keratosis    Aortic atherosclerosis 05/25/2020   Arthritis    spinal stenosis   Cardiomyopathy - presumed to be nonischemic    a. 03/2020 Echo: EF 40-45%, gr2 DD. Nl RV fxn. Mildly dil LA. Mild MR/ao sclerosis; b. 03/2020 MV: EF 30-44%, no ischemia. Cor Ca2+ in pLAD.   CHF (congestive heart failure)    COPD (chronic obstructive pulmonary disease)    Coronary artery calcification seen on CT scan    a. 03/2020 MV: CT attenuation images show Ao and mod pLAD Ca2+.   Diabetes mellitus without complication    Gastric ulcer 06/2010   treated with Prolisec- states no problems now   Hyperlipidemia    Hypertension    PCP Dr Julieanne Manson   [place]   Hypothyroidism    Left bundle branch block (LBBB)     Neuromuscular disorder    slight numbness right toes- comes and goes   Peripheral vascular disease    varicose veins left leg   Pneumonia    PSVT (paroxysmal supraventricular tachycardia)    a. 03/2020 Zio: Avg HR 87 (66-211).  4 beats NSVT.  43 PSVT episodes, longest 10h 92m @ avg of 153 bpm (max 211).  Seen by EP-->amio started (pt wished to avoid EPS/RFCA).   Seasonal allergies    Shortness of breath    Skin cancer    multiple from face   Squamous cell carcinoma of skin 03/23/2013   L dorsal hand   Squamous cell carcinoma of skin 02/04/2014   R forearm/in situ, L pretibial   Squamous cell carcinoma of skin 09/24/2018   R thumb   Squamous cell carcinoma of skin 09/08/2019   L forearm - ED&C   Squamous cell carcinoma of skin 09/08/2019   R dosal hand - ED&C   Squamous cell carcinoma of skin 06/20/2021   R cheek preauricular, excised    Past Surgical History:  Procedure Laterality Date   BACK SURGERY     4/12  Dr Simonne Come- for lumbar stenosis   BREAST CYST ASPIRATION Left 1965   negative   BREAST SURGERY  1962   lumpectomy with biopsy   left   CARPAL TUNNEL RELEASE     right   COLONOSCOPY     COLONOSCOPY WITH PROPOFOL N/A 06/30/2015  Procedure: COLONOSCOPY WITH PROPOFOL;  Surgeon: Wallace Cullens, MD;  Location: [facility] ENDOSCOPY;  Service: Gastroenterology;  Laterality: N/A;   COLONOSCOPY WITH PROPOFOL N/A 01/22/2017   Procedure: COLONOSCOPY WITH PROPOFOL;  Surgeon: Christena Deem, MD;  Location: [facility] ENDOSCOPY;  Service: Endoscopy;  Laterality: N/A;   COLONOSCOPY WITH PROPOFOL N/A 01/28/2019   Procedure: COLONOSCOPY WITH PROPOFOL;  Surgeon: Toledo, Boykin Nearing, MD;  Location: [facility] ENDOSCOPY;  Service: Gastroenterology;  Laterality: N/A;   ESOPHAGOGASTRODUODENOSCOPY     ESOPHAGOGASTRODUODENOSCOPY (EGD) WITH PROPOFOL N/A 01/28/2019   Procedure: ESOPHAGOGASTRODUODENOSCOPY (EGD) WITH PROPOFOL;  Surgeon: Toledo, Boykin Nearing, MD;  Location: [facility] ENDOSCOPY;  Service: Gastroenterology;   Laterality: N/A;   EYE SURGERY     cataract extraction with IOL bilaterally   JOINT REPLACEMENT     LUMBAR LAMINECTOMY/DECOMPRESSION MICRODISCECTOMY  05/24/2011   Procedure: LUMBAR LAMINECTOMY/DECOMPRESSION MICRODISCECTOMY 2 LEVELS;  Surgeon: Drucilla Schmidt, MD;  Location: [facility] ORS;  Service: Orthopedics;  Laterality: N/A;  L2-L3, L3-L4 (x-ray)   RIGHT OOPHORECTOMY     due to STAPH INFECTION    Current Outpatient Medications  Medication Instructions   acetaminophen (TYLENOL) 1,000 mg, Oral, Every 6 hours PRN, Pain<BR>   albuterol (PROVENTIL) (2.5 MG/3ML) 0.083% nebulizer solution USE 1 VIAL VIA NEBULIZER EVERY 6 HOURS AS NEEDED FOR WHEEZING OR SHORTNESS OF BREATH   amiodarone (PACERONE) 100 mg, Oral, Daily   aspirin EC 81 mg, Oral, Daily, Swallow whole.   atorvastatin (LIPITOR) 40 MG tablet TAKE 1 TABLET(40 MG) BY MOUTH DAILY   carvedilol (COREG) 6.25 MG tablet TAKE 1 TABLET(6.25 MG) BY MOUTH TWICE DAILY WITH A MEAL   dapagliflozin propanediol (FARXIGA) 10 mg, Oral, Daily   gabapentin (NEURONTIN) 400 mg, Oral, 4 times daily   glucose blood (CONTOUR NEXT TEST) test strip Check sugar once daily  DX E11.9   hydrOXYzine (ATARAX) 10 MG tablet TAKE 1 TO 2 TABLETS(10 TO 20 MG) BY MOUTH EVERY 6 HOURS AS NEEDED   levothyroxine (SYNTHROID) 100 MCG tablet TAKE 1 TABLET(100 MCG) BY MOUTH DAILY BEFORE BREAKFAST   losartan (COZAAR) 25 mg, Oral, Every evening   metFORMIN (GLUCOPHAGE) 1000 MG tablet TAKE 1 TABLET(1000 MG) BY MOUTH DAILY WITH SUPPER   omeprazole (PRILOSEC) 20 MG capsule TAKE 1 CAPSULE(20 MG) BY MOUTH DAILY   SPIRIVA HANDIHALER 18 MCG inhalation capsule PLACE 1 CAPSULE INTO INHALER AND INHALE EVERY DAY AS NEEDED   torsemide (DEMADEX) 40 mg, Oral, Daily    Social History:  The patient  reports that she quit smoking about 18 years ago. Her smoking use included cigarettes. She has a 25.00 pack-year smoking history. She has never used smokeless tobacco. She reports current alcohol use. She  reports that she does not use drugs.   Family History:  The patient's family history includes Arthritis in her son; Breast cancer in her maternal aunt; Dementia in her mother; Heart disease in her father; Hypertension in her sister; Lung cancer in her father.  ROS:  Please see the history of present illness. All other systems are reviewed and otherwise negative.   PHYSICAL EXAM:  VS:  BP (!) 106/52 (BP Location: Right Arm, Patient Position: Sitting, Cuff Size: Normal)   Pulse 76   Ht 5\' 4"  (1.626 m)   Wt 181 lb (82.1 kg)   SpO2 97%   BMI 31.07 kg/m  BMI: Body mass index is 31.07 kg/m.  GEN- The patient is well appearing, alert and oriented x 3 today.   Lungs- Clear to ausculation bilaterally, normal work of  breathing.  Heart- Regular rate and rhythm, no murmurs, rubs or gallops Extremities- 1+ peripheral edema, warm, dry   EKG is ordered. Personal review of EKG from today shows:  NSR at 76bpm, incomplete LBBB  Recent Labs: 12/12/2021: TSH 3.620 03/21/2022: ALT 16; B Natriuretic Peptide 676.4 03/22/2022: Hemoglobin 8.5; Platelets 278 03/23/2022: Magnesium 2.3 06/21/2022: BUN 41; Creatinine, Ser 1.42; Potassium 5.5; Sodium 140  12/12/2021: Chol/HDL Ratio 2.1; Cholesterol, Total 139; HDL 66; LDL Chol Calc (NIH) 41; Triglycerides 204   CrCl cannot be calculated (Patient's most recent lab result is older than the maximum 21 days allowed.).   Wt Readings from Last 3 Encounters:  07/13/22 181 lb (82.1 kg)  06/21/22 185 lb (83.9 kg)  05/07/22 181 lb (82.1 kg)     Additional studies reviewed include: Previous EP, cardiology notes.   TTE, 05/24/2022  1. Left ventricular ejection fraction, by estimation, is 35 to 40%. The left ventricle has moderately decreased function. The left ventricle demonstrates global hypokinesis. Left ventricular diastolic parameters are consistent with Grade I diastolic dysfunction (impaired relaxation).   2. Right ventricular systolic function is normal. The  right ventricular size is normal. There is normal pulmonary artery systolic pressure. The estimated right ventricular systolic pressure is 14.2 mmHg.   3. The mitral valve is normal in structure. Mild mitral valve regurgitation. No evidence of mitral stenosis.   4. The aortic valve is normal in structure. Aortic valve regurgitation is not visualized. No aortic stenosis is present.   5. The inferior vena cava is normal in size with greater than 50% respiratory variability, suggesting right atrial pressure of 3 mmHg.   Long Term Monitor, 04/21/2020 Normal sinus rhythm Patient had a min HR of 66 bpm, max HR of 211 bpm, and avg HR of 87 bpm.    1 run of Ventricular Tachycardia occurred lasting 4 beats with a max rate of 185 bpm (avg 168 bpm).   43 Supraventricular Tachycardia  runs occurred, the run with the fastest interval lasting 10 hours 35 mins with a max rate of 211 bpm (avg 153 bpm); the run with the fastest interval was also the longest.  Idioventricular Rhythm was present. Isolated SVEs were rare (<1.0%), SVE Couplets  were rare (<1.0%), and SVE Triplets were rare (<1.0%).  Isolated VEs were rare (<1.0%, 2912), VE Couplets were rare (<1.0%, 23), and VE Triplets were rare (<1.0%, 7).  Ventricular Bigeminy and Trigeminy were present.   Patient triggered events (shortness of breath, fluttering/racing)  were not associated with significant arrhythmia    ASSESSMENT AND PLAN:  #) parox SVT #) high risk medication monitor - amiodarone Minimal palpitation burden Tolerating amiodarone 100mg  daily well - update amio labs    #) HFrEF Warm and dry on exam; some lower extremity edema Adv HF team had considered switching to entresto. Patient is not interested at this time d/t cost  GDMT - coreg, farxiga, losartan Diuretic - 40mg  torsemide daily   #) HTN Well-controlled in office Cont gdmt and diuretic as above   Current medicines are reviewed at length with the patient today.   The  patient does not have concerns regarding her medicines.  The following changes were made today:  none  Labs/ tests ordered today include:  Orders Placed This Encounter  Procedures   Comp Met (CMET)   TSH   T4, free   EKG 12-Lead     Disposition: Follow up with Dr. Lalla Brothers or EP APP in 6 months   Signed, Sherie Don,  NP  07/13/22  3:51 PM  Electrophysiology [facility]

## 2022-07-13 ENCOUNTER — Ambulatory Visit: Payer: Medicare HMO | Attending: Cardiology | Admitting: Cardiology

## 2022-07-13 ENCOUNTER — Other Ambulatory Visit
Admission: RE | Admit: 2022-07-13 | Discharge: 2022-07-13 | Disposition: A | Discharge status: Home / Self Care | Payer: Medicare HMO | Attending: Cardiology | Admitting: Cardiology

## 2022-07-13 ENCOUNTER — Encounter: Payer: Self-pay | Admitting: Cardiology

## 2022-07-13 VITALS — BP 106/52 | HR 76 | Ht 64.0 in | Wt 181.0 lb

## 2022-07-13 DIAGNOSIS — Z79899 Other long term (current) drug therapy: Secondary | ICD-10-CM | POA: Diagnosis present

## 2022-07-13 DIAGNOSIS — I5020 Unspecified systolic (congestive) heart failure: Secondary | ICD-10-CM

## 2022-07-13 DIAGNOSIS — I471 Supraventricular tachycardia, unspecified: Secondary | ICD-10-CM

## 2022-07-13 DIAGNOSIS — I10 Essential (primary) hypertension: Secondary | ICD-10-CM | POA: Diagnosis not present

## 2022-07-13 LAB — TSH: TSH: 1.678 u[IU]/mL (ref 0.350–4.500)

## 2022-07-13 LAB — COMPREHENSIVE METABOLIC PANEL
ALT: 14 U/L (ref 0–44)
AST: 16 U/L (ref 15–41)
Albumin: 3.7 g/dL (ref 3.5–5.0)
Alkaline Phosphatase: 70 U/L (ref 38–126)
Anion gap: 8 (ref 5–15)
BUN: 35 mg/dL — ABNORMAL HIGH (ref 8–23)
CO2: 26 mmol/L (ref 22–32)
Calcium: 9.4 mg/dL (ref 8.9–10.3)
Chloride: 108 mmol/L (ref 98–111)
Creatinine, Ser: 1.21 mg/dL — ABNORMAL HIGH (ref 0.44–1.00)
GFR, Estimated: 44 mL/min — ABNORMAL LOW (ref >60)
Glucose, Bld: 99 mg/dL (ref 70–99)
Potassium: 5.2 mmol/L — ABNORMAL HIGH (ref 3.5–5.1)
Sodium: 142 mmol/L (ref 135–145)
Total Bilirubin: 0.4 mg/dL (ref 0.3–1.2)
Total Protein: 7 g/dL (ref 6.5–8.1)

## 2022-07-13 LAB — T4, FREE: Free T4: 1.07 ng/dL (ref 0.61–1.12)

## 2022-07-13 NOTE — Patient Instructions (Signed)
Medication Instructions:  Your physician recommends that you continue on your current medications as directed. Please refer to the Current Medication list given to you today.  *If you need a refill on your cardiac medications before your next appointment, please call your pharmacy*   Lab Work: CMP, TSH, and Free T4 today - Please go to the [facility]. You will check in at the front desk to the right as you walk into the atrium. Valet Parking is offered if needed. - No appointment needed. You may go any day between 7 am and 6 pm.  If you have labs (blood work) drawn today and your tests are completely normal, you will receive your results only by: MyChart Message (if you have MyChart) OR A paper copy in the mail If you have any lab test that is abnormal or we need to change your treatment, we will call you to review the results.   Testing/Procedures: No testing ordered  Follow-Up: At [facility], you and your health needs are our priority.  As part of our continuing mission to provide you with exceptional heart care, we have created designated Provider Care Teams.  These Care Teams include your primary Cardiologist (physician) and Advanced Practice Providers (APPs -  Physician Assistants and Nurse Practitioners) who all work together to provide you with the care you need, when you need it.  We recommend signing up for the patient portal called "MyChart".  Sign up information is provided on this After Visit Summary.  MyChart is used to connect with patients for Virtual Visits (Telemedicine).  Patients are able to view lab/test results, encounter notes, upcoming appointments, etc.  Non-urgent messages can be sent to your provider as well.   To learn more about what you can do with MyChart, go to [email or web address].    Your next appointment:   6-9 month(s)  Provider:   Steffanie Dunn, MD

## 2022-07-16 ENCOUNTER — Encounter: Payer: Medicare HMO | Admitting: Physician Assistant

## 2022-07-16 ENCOUNTER — Other Ambulatory Visit: Payer: Self-pay | Admitting: Family Medicine

## 2022-07-16 ENCOUNTER — Other Ambulatory Visit: Payer: Self-pay | Admitting: Cardiovascular Disease

## 2022-07-16 DIAGNOSIS — E11621 Type 2 diabetes mellitus with foot ulcer: Secondary | ICD-10-CM | POA: Diagnosis not present

## 2022-07-16 NOTE — Progress Notes (Signed)
KATEE, WENTLAND ([id number]) [id number] Page 1 of 7 Visit Report for 07/16/2022 Chief Complaint Document Details Patient Name: Date of Service: Kathryn Pounds [name] 07/16/2022 3:30 PM Medical Record Number: [id number] Patient Account Number: [id number] Date of Birth/Sex: Treating RN: Mar 18, 1934 (89 y.o. Kathryn [name] Primary Care Provider: Ronnald Ramp Other Clinician: Referring Provider: Treating Provider/Extender: Patric Dykes Weeks in Treatment: 10 Information Obtained from: Patient Chief Complaint Right second toe ulcer Electronic Signature(s) Signed: 07/16/2022 3:37:52 PM By: Allen Derry PA-C Entered By: Allen Derry on 07/16/2022 15:37:52 -------------------------------------------------------------------------------- Debridement Details Patient Name: Date of Service: Kathryn [name], [name] 07/16/2022 3:30 PM Medical Record Number: [id number] Patient Account Number: [id number] Date of Birth/Sex: Treating RN: 11-30-1933 (89 y.o. Kathryn [name] Primary Care Provider: Ronnald Ramp Other Clinician: Referring Provider: Treating Provider/Extender: Patric Dykes Weeks in Treatment: 10 Debridement Performed for Assessment: Wound #9 Right T Second oe Performed By: Physician Allen Derry, PA-C Debridement Type: Debridement Severity of Tissue Pre Debridement: Fat layer exposed Level of Consciousness (Pre-procedure): Awake and Alert Pre-procedure Verification/Time Out Yes - 16:21 Taken: Start Time: 16:21 Pain Control: Lidocaine 4% T opical Solution Percent of Wound Bed Debrided: 100% T Area Debrided (cm): otal 0.16 Tissue and other material debrided: Viable, Non-Viable, Slough, Subcutaneous, Slough Level: Skin/Subcutaneous Tissue Debridement Description: Excisional Instrument: Curette Bleeding: Minimum Hemostasis Achieved: Pressure Procedural Pain: 4 Post Procedural Pain:  4 Response to Treatment: Procedure was tolerated well Level of Consciousness (Post- Awake and Alert procedure): Post Debridement Measurements of Total Wound Length: (cm) 0.4 Width: (cm) 0.5 Depth: (cm) 0.3 Volume: (cm) 0.047 Character of Wound/Ulcer Post Debridement: Stable Severity of Tissue Post Debridement: Fat layer exposed Post Procedure Diagnosis Same as Pre-procedure Electronic Signature(s) Signed: 07/16/2022 5:08:27 PM By: Midge Aver MSN RN CNS WTA Signed: 07/17/2022 11:50:37 AM By: Allen Derry PA-C Entered By: Midge Aver on 07/16/2022 16:23:16 Coover, Kerri Perches ([id number]) [id number] Page 2 of 7 -------------------------------------------------------------------------------- HPI Details Patient Name: Date of Service: Kathryn Pounds [name] 07/16/2022 3:30 PM Medical Record Number: [id number] Patient Account Number: [id number] Date of Birth/Sex: Treating RN: 1933/11/05 (89 y.o. Kathryn [name] Primary Care Provider: Ronnald Ramp Other Clinician: Referring Provider: Treating Provider/Extender: Patric Dykes Weeks in Treatment: 10 History of Present Illness HPI Description: 02-06-2022 upon evaluation today patient presents for initial inspection here in our clinic concerning wounds that she has over the bilateral feet as well as the right leg. She has been tolerating the dressing changes in general here without complication she has been putting antibiotic ointment on this. With that being said I do believe that she would benefit from the use of something like Xeroform gauze dressings which would likely be a bit better. Fortunately I do not see any signs of infection which is good news do not think she needs any antibiotics. She tells me that these wounds typically occur as a result of her bumping or hitting her leg and then they slowly progressed from there. Patient does have a history of diabetes for which her  hemoglobin A1c was 6.9 measured on 12-12-2021. She also has a history of arterial insufficiency with a right ABI of 0.66 with a TBI of 0.61 and a left ABI of 0.82 with a TBI of 0.45. She has a follow-up evaluation on 02-27-2022 with vascular. This testing was done on 01-28-2022. Subsequently the patient also has a history of hypertension, generalized weakness, congestive heart failure, and lower extremity edema. 02-13-2022 upon evaluation today patient appears  to be doing well currently in regard to her wounds. She is actually making some good progress here which is great news and overall I am extremely pleased with where we stand. I do not see any evidence of worsening. 02-22-2022 upon evaluation today patient's wounds actually are showing some signs of improvement for the most part. Fortunately there does not appear to be any evidence of infection locally nor systemically at this point which is great news. No fevers, chills, nausea, vomiting, or diarrhea. She does have an a vascular appointment on December 5 and we will see what they have to say at that point. 03-08-2022 upon evaluation today patient actually appears to be doing excellent in regards to her wounds. She has been tolerating the dressing changes without complication. Fortunately there is no sign of active infection locally nor systemically at this point which is great news. Readmission: 05-07-2022 upon evaluation today patient presents for reevaluation here in the clinic it has been actually December 14 since I have last seen her. Subsequently she does still have a wound on the right second toe. This is the one area that has not healed everything on the left ended up healing. Fortunately I do not see any signs of infection at this point and really nothing is changed in her medical history since the last evaluation. She just tells me that this wound is just being stubborn and does not want to heal. 05-14-2022 upon evaluation today patient  appears to be doing poorly in regard to her toe ulcer. Unfortunately she is showing signs of this being a little worse than last week. I was hopeful that this would actually show some signs of improvement but were definitely not seeing that. I think we probably need to switch to a different dressing, recommend Iodosorb/Iodoflex as an option here for her. 05-21-2022 upon evaluation today patient appears to be doing well currently in regard to her toe ulcer. I did review her culture as well as her x-ray both are negative at this point. There is no growth after 3 days. With that being said that she has been exposed this actually is looking much better Is read as it was and overall much happier with where things stand currently. I do not see any signs of infection locally nor systemically at this point. 3/4; this is a patient with a punched out area on the dorsal aspect of her right second toe in the inner phalangeal joint area. She had a previous 1 that healed on the left second toe. She saw Dr. Gilda Crease at [facility] vascular. He did not feel she required any surgical procedure suggested venous. We have been using Iodoflex 3/12; patient presents for follow-up. She has been using Iodoflex to the second right toe. She has no issues or complaints today. 06-12-2022 upon evaluation today patient appears to be doing well currently in regard to her wound. She actually seems to be making some progress the Iodoflex seems to be doing a pretty good job here. Fortunately I do not see any evidence of active infection locally nor systemically which is great news. No fevers, chills, nausea, vomiting, or diarrhea. 06-19-2022 upon evaluation today patient appears to be doing well currently in regard to her toe ulcer actually feel like this is getting cleaner and I am very pleased in that regard. I do not see any signs of infection locally or systemically. 06-25-2022 upon evaluation today patient appears to be doing well  currently in regard to her wound. She is  going require little bit of sharp debridement but fortunately I am seeing some definite improvements here. I do not see any signs of infection locally nor systemically which is great news. No fevers, chills, nausea, vomiting, or diarrhea. 07-09-2022 upon evaluation today patient appears to be doing well currently in regard to her wound. She is actually showing signs of improvement I think the collagen is doing a good job. I do not see any signs of active infection locally nor systemically at this point. No fevers, chills, nausea, vomiting, or diarrhea. 07-16-2022 upon evaluation today patient appears to be doing poorly currently in regard to her toe. It does not look like she is having any signs of infection systemically but locally I do feel like there may be some inflammation at least in the question is is there is some infection there is definitely bone still exposed in the base of the wound not even sure there is not more of this and covering at this point. Fortunately I do not see that the redness seems to be spreading but the toe does seem a little bit more swollen. I think I am going to place her on antibiotics today which I discussed with her as well. Also believe that we probably need to consider an MRI to see where things stand. Electronic Signature(s) Signed: 07/17/2022 8:03:09 AM By: Allen Derry PA-C Entered By: Allen Derry on 07/17/2022 08:03:09 Physical Exam Details -------------------------------------------------------------------------------- Kathryn [name] ([id number]) [id number] Page 3 of 7 Patient Name: Date of Service: Kathryn Pounds NNA Elbert Ewings 07/16/2022 3:30 PM Medical Record Number: [id number] Patient Account Number: [id number] Date of Birth/Sex: Treating RN: 07-07-1933 (89 y.o. Kathryn [name] Primary Care Provider: Ronnald Ramp Other Clinician: Referring Provider: Treating Provider/Extender:  [name], [name] in Treatment: 10 Constitutional Well-nourished and well-hydrated in no acute distress. Respiratory normal breathing without difficulty. Psychiatric this patient is able to make decisions and demonstrates good insight into disease process. Alert and Oriented x 3. pleasant and cooperative. Notes Upon inspection patient's wound bed actually showed signs of good granulation epithelization at this point. Fortunately there does not appear to be any signs of active infection locally nor systemically which is great news and overall I am extremely pleased with where we stand currently. Electronic Signature(s) Signed: 07/17/2022 8:03:29 AM By: Allen Derry PA-C Entered By: Allen Derry on 07/17/2022 08:03:29 -------------------------------------------------------------------------------- Physician Orders Details Patient Name: Date of Service: Kathryn [name], [name] 07/16/2022 3:30 PM Medical Record Number: [id number] Patient Account Number: [id number] Date of Birth/Sex: Treating RN: November 15, 1933 (89 y.o. Kathryn [name] Primary Care Provider: Ronnald Ramp Other Clinician: Referring Provider: Treating Provider/Extender: Patric Dykes Weeks in Treatment: 10 Verbal / Phone Orders: No Diagnosis Coding ICD-10 Coding Code Description E11.621 Type 2 diabetes mellitus with foot ulcer I73.89 Other specified peripheral vascular diseases L97.512 Non-pressure chronic ulcer of other part of right foot with fat layer exposed M62.81 Muscle weakness (generalized) I50.42 Chronic combined systolic (congestive) and diastolic (congestive) heart failure I10 Essential (primary) hypertension Follow-up Appointments Return Appointment in 1 week. Bathing/ Shower/ Hygiene May shower; gently cleanse wound with antibacterial soap, rinse and pat dry prior to dressing wounds Anesthetic (Use 'Patient Medications' Section for Anesthetic Order  Entry) Lidocaine applied to wound bed Edema Control - Lymphedema / Segmental Compressive Device / Other Elevate, Exercise Daily and A void Standing for Long Periods of Time. Elevate legs to the level of the heart and pump ankles as often as possible Elevate leg(s) parallel to the  floor when sitting. DO YOUR BEST to sleep in the bed at night. DO NOT sleep in your recliner. Long hours of sitting in a recliner leads to swelling of the legs and/or potential wounds on your backside. Wound Treatment Wound #9 - T Second oe Wound Laterality: Right Cleanser: Soap and Water 3 x Per Week/30 Days Discharge Instructions: Gently cleanse wound with antibacterial soap, rinse and pat dry prior to dressing wounds Prim Dressing: Prisma 4.34 (in) 3 x Per Week/30 Days ary Discharge Instructions: Moisten w/normal saline or sterile water; Cover wound as directed. Do not remove from wound bed. Secondary Dressing: Coverlet Latex-Free Fabric Adhesive Dressings 3 x Per Week/30 Days Kathryn [name], Kathryn [name] ([id number]) [id number] Page 4 of 7 Discharge Instructions: 1.5 x 2 Radiology MRI with and without Contrast - Right foot Patient Medications llergies: oxycodone, prednisone A Notifications Medication Indication Start End 07/16/2022 doxycycline hyclate DOSE 1 - oral 100 mg capsule - 1 capsule oral twice a day x 30 days Electronic Signature(s) Signed: 07/16/2022 5:52:58 PM By: Allen Derry PA-C Previous Signature: 07/16/2022 5:08:27 PM Version By: Midge Aver MSN RN CNS WTA Previous Signature: 07/16/2022 3:59:12 PM Version By: Midge Aver MSN RN CNS WTA Entered By: Allen Derry on 07/16/2022 17:52:58 -------------------------------------------------------------------------------- Problem List Details Patient Name: Date of Service: Kathryn [name], [name] 07/16/2022 3:30 PM Medical Record Number: [id number] Patient Account Number: [id number] Date of Birth/Sex: Treating RN: Jul 18, 1933 (89 y.o. Kathryn [name] Primary Care Provider: Ronnald Ramp Other Clinician: Referring Provider: Treating Provider/Extender: Patric Dykes Weeks in Treatment: 10 Active Problems ICD-10 Encounter Code Description Active Date MDM Diagnosis E11.621 Type 2 diabetes mellitus with foot ulcer 05/07/2022 No Yes I73.89 Other specified peripheral vascular diseases 05/07/2022 No Yes L97.512 Non-pressure chronic ulcer of other part of right foot with fat layer exposed 05/07/2022 No Yes M62.81 Muscle weakness (generalized) 05/07/2022 No Yes I50.42 Chronic combined systolic (congestive) and diastolic (congestive) heart failure 05/07/2022 No Yes I10 Essential (primary) hypertension 05/07/2022 No Yes Inactive Problems Resolved Problems Electronic Signature(s) Signed: 07/16/2022 3:37:46 PM By: Allen Derry PA-C Entered By: Allen Derry on 07/16/2022 15:37:46 Veazey, Kerri Perches ([id number]) [id number] Page 5 of 7 -------------------------------------------------------------------------------- Progress Note Details Patient Name: Date of Service: Kathryn Pounds [name] 07/16/2022 3:30 PM Medical Record Number: [id number] Patient Account Number: [id number] Date of Birth/Sex: Treating RN: 1934-02-12 (89 y.o. Kathryn [name] Primary Care Provider: Ronnald Ramp Other Clinician: Referring Provider: Treating Provider/Extender: Patric Dykes Weeks in Treatment: 10 Subjective Chief Complaint Information obtained from Patient Right second toe ulcer History of Present Illness (HPI) 02-06-2022 upon evaluation today patient presents for initial inspection here in our clinic concerning wounds that she has over the bilateral feet as well as the right leg. She has been tolerating the dressing changes in general here without complication she has been putting antibiotic ointment on this. With that being said I do believe that she would  benefit from the use of something like Xeroform gauze dressings which would likely be a bit better. Fortunately I do not see any signs of infection which is good news do not think she needs any antibiotics. She tells me that these wounds typically occur as a result of her bumping or hitting her leg and then they slowly progressed from there. Patient does have a history of diabetes for which her hemoglobin A1c was 6.9 measured on 12-12-2021. She also has a history of arterial insufficiency with a right ABI of 0.66 with a TBI of 0.61  and a left ABI of 0.82 with a TBI of 0.45. She has a follow-up evaluation on 02-27-2022 with vascular. This testing was done on 01-28-2022. Subsequently the patient also has a history of hypertension, generalized weakness, congestive heart failure, and lower extremity edema. 02-13-2022 upon evaluation today patient appears to be doing well currently in regard to her wounds. She is actually making some good progress here which is great news and overall I am extremely pleased with where we stand. I do not see any evidence of worsening. 02-22-2022 upon evaluation today patient's wounds actually are showing some signs of improvement for the most part. Fortunately there does not appear to be any evidence of infection locally nor systemically at this point which is great news. No fevers, chills, nausea, vomiting, or diarrhea. She does have an a vascular appointment on December 5 and we will see what they have to say at that point. 03-08-2022 upon evaluation today patient actually appears to be doing excellent in regards to her wounds. She has been tolerating the dressing changes without complication. Fortunately there is no sign of active infection locally nor systemically at this point which is great news. Readmission: 05-07-2022 upon evaluation today patient presents for reevaluation here in the clinic it has been actually December 14 since I have last seen her. Subsequently she  does still have a wound on the right second toe. This is the one area that has not healed everything on the left ended up healing. Fortunately I do not see any signs of infection at this point and really nothing is changed in her medical history since the last evaluation. She just tells me that this wound is just being stubborn and does not want to heal. 05-14-2022 upon evaluation today patient appears to be doing poorly in regard to her toe ulcer. Unfortunately she is showing signs of this being a little worse than last week. I was hopeful that this would actually show some signs of improvement but were definitely not seeing that. I think we probably need to switch to a different dressing, recommend Iodosorb/Iodoflex as an option here for her. 05-21-2022 upon evaluation today patient appears to be doing well currently in regard to her toe ulcer. I did review her culture as well as her x-ray both are negative at this point. There is no growth after 3 days. With that being said that she has been exposed this actually is looking much better Is read as it was and overall much happier with where things stand currently. I do not see any signs of infection locally nor systemically at this point. 3/4; this is a patient with a punched out area on the dorsal aspect of her right second toe in the inner phalangeal joint area. She had a previous 1 that healed on the left second toe. She saw Dr. Gilda Crease at [facility] vascular. He did not feel she required any surgical procedure suggested venous. We have been using Iodoflex 3/12; patient presents for follow-up. She has been using Iodoflex to the second right toe. She has no issues or complaints today. 06-12-2022 upon evaluation today patient appears to be doing well currently in regard to her wound. She actually seems to be making some progress the Iodoflex seems to be doing a pretty good job here. Fortunately I do not see any evidence of active infection locally nor  systemically which is great news. No fevers, chills, nausea, vomiting, or diarrhea. 06-19-2022 upon evaluation today patient appears to be doing well currently  in regard to her toe ulcer actually feel like this is getting cleaner and I am very pleased in that regard. I do not see any signs of infection locally or systemically. 06-25-2022 upon evaluation today patient appears to be doing well currently in regard to her wound. She is going require little bit of sharp debridement but fortunately I am seeing some definite improvements here. I do not see any signs of infection locally nor systemically which is great news. No fevers, chills, nausea, vomiting, or diarrhea. 07-09-2022 upon evaluation today patient appears to be doing well currently in regard to her wound. She is actually showing signs of improvement I think the collagen is doing a good job. I do not see any signs of active infection locally nor systemically at this point. No fevers, chills, nausea, vomiting, or diarrhea. 07-16-2022 upon evaluation today patient appears to be doing poorly currently in regard to her toe. It does not look like she is having any signs of infection systemically but locally I do feel like there may be some inflammation at least in the question is is there is some infection there is definitely bone still exposed in the base of the wound not even sure there is not more of this and covering at this point. Fortunately I do not see that the redness seems to be spreading but the toe does seem a little bit more swollen. I think I am going to place her on antibiotics today which I discussed with her as well. Also believe that we probably need to consider an MRI to see where things stand. Objective Kathryn [name], Kathryn [name] ([id number]) [id number] Page 6 of 7 Constitutional Well-nourished and well-hydrated in no acute distress. Vitals Time Taken: 3:41 PM, Height: 64 in, Weight: 180 lbs, BMI: 30.9,  Temperature: 97.9 F, Pulse: 75 bpm, Respiratory Rate: 16 breaths/min, Blood Pressure: 113/49 mmHg. Respiratory normal breathing without difficulty. Psychiatric this patient is able to make decisions and demonstrates good insight into disease process. Alert and Oriented x 3. pleasant and cooperative. General Notes: Upon inspection patient's wound bed actually showed signs of good granulation epithelization at this point. Fortunately there does not appear to be any signs of active infection locally nor systemically which is great news and overall I am extremely pleased with where we stand currently. Integumentary (Hair, Skin) Wound #9 status is Open. Original cause of wound was Gradually Appeared. The date acquired was: 01/24/2022. The wound has been in treatment 10 weeks. The wound is located on the Right T Second. The wound measures 0.4cm length x 0.5cm width x 0.2cm depth; 0.157cm^2 area and 0.031cm^3 volume. There is oe Fat Layer (Subcutaneous Tissue) exposed. There is a medium amount of serosanguineous drainage noted. There is no granulation within the wound bed. There is a large (67-100%) amount of necrotic tissue within the wound bed. Assessment Active Problems ICD-10 Type 2 diabetes mellitus with foot ulcer Other specified peripheral vascular diseases Non-pressure chronic ulcer of other part of right foot with fat layer exposed Muscle weakness (generalized) Chronic combined systolic (congestive) and diastolic (congestive) heart failure Essential (primary) hypertension Procedures Wound #9 Pre-procedure diagnosis of Wound #9 is a Diabetic Wound/Ulcer of the Lower Extremity located on the Right T Second .Severity of Tissue Pre Debridement oe is: Fat layer exposed. There was a Excisional Skin/Subcutaneous Tissue Debridement with a total area of 0.16 sq cm performed by Allen Derry, PA-C. With the following instrument(s): Curette to remove Viable and Non-Viable tissue/material. Material  removed includes Subcutaneous Tissue and  Slough and after achieving pain control using Lidocaine 4% T opical Solution. No specimens were taken. A time out was conducted at 16:21, prior to the start of the procedure. A Minimum amount of bleeding was controlled with Pressure. The procedure was tolerated well with a pain level of 4 throughout and a pain level of 4 following the procedure. Post Debridement Measurements: 0.4cm length x 0.5cm width x 0.3cm depth; 0.047cm^3 volume. Character of Wound/Ulcer Post Debridement is stable. Severity of Tissue Post Debridement is: Fat layer exposed. Post procedure Diagnosis Wound #9: Same as Pre-Procedure Plan Follow-up Appointments: Return Appointment in 1 week. Bathing/ Shower/ Hygiene: May shower; gently cleanse wound with antibacterial soap, rinse and pat dry prior to dressing wounds Anesthetic (Use 'Patient Medications' Section for Anesthetic Order Entry): Lidocaine applied to wound bed Edema Control - Lymphedema / Segmental Compressive Device / Other: Elevate, Exercise Daily and Avoid Standing for Long Periods of Time. Elevate legs to the level of the heart and pump ankles as often as possible Elevate leg(s) parallel to the floor when sitting. DO YOUR BEST to sleep in the bed at night. DO NOT sleep in your recliner. Long hours of sitting in a recliner leads to swelling of the legs and/or potential wounds on your backside. Radiology ordered were: MRI with and without Contrast - Right foot The following medication(s) was prescribed: doxycycline hyclate oral 100 mg capsule 1 1 capsule oral twice a day x 30 days starting 07/16/2022 WOUND #9: - T Second Wound Laterality: Right oe Cleanser: Soap and Water 3 x Per Week/30 Days Discharge Instructions: Gently cleanse wound with antibacterial soap, rinse and pat dry prior to dressing wounds Prim Dressing: Prisma 4.34 (in) 3 x Per Week/30 Days ary Discharge Instructions: Moisten w/normal saline or sterile  water; Cover wound as directed. Do not remove from wound bed. Secondary Dressing: Coverlet Latex-Free Fabric Adhesive Dressings 3 x Per Week/30 Days Discharge Instructions: 1.5 x 2 Kathryn [name], Kathryn [name] ([id number]) [id number] Page 7 of 7 1. I am good recommend that we have the patient continue to monitor for any signs of infection or worsening. Based on what I am seeing I do believe that she is making decent progress but I still feel like there is too much bone noted in the base of this wound even though the x-ray was negative February I think we probably need to proceed with MRI she is in agreement with this plan. 2. I am good to go ahead as well place her on doxycycline again I think that osteomyelitis is at least a strong possibility here we will see what the MRI shows. We will see patient back for reevaluation in 1 week here in the clinic. If anything worsens or changes patient will contact our office for additional recommendations. Electronic Signature(s) Signed: 07/17/2022 8:04:01 AM By: Allen Derry PA-C Entered By: Allen Derry on 07/17/2022 08:04:01 -------------------------------------------------------------------------------- SuperBill Details Patient Name: Date of Service: Kathryn [name], [name] 07/16/2022 Medical Record Number: [id number] Patient Account Number: [id number] Date of Birth/Sex: Treating RN: 06/29/1933 (89 y.o. Kathryn [name] Primary Care Provider: Ronnald Ramp Other Clinician: Referring Provider: Treating Provider/Extender: Patric Dykes Weeks in Treatment: 10 Diagnosis Coding ICD-10 Codes Code Description E11.621 Type 2 diabetes mellitus with foot ulcer I73.89 Other specified peripheral vascular diseases L97.512 Non-pressure chronic ulcer of other part of right foot with fat layer exposed M62.81 Muscle weakness (generalized) I50.42 Chronic combined systolic (congestive) and diastolic (congestive) heart  failure I10 Essential (primary) hypertension Facility Procedures :  CPT4 Code: 16109604 Description: 11042 - DEB SUBQ TISSUE 20 SQ CM/< ICD-10 Diagnosis Description L97.512 Non-pressure chronic ulcer of other part of right foot with fat layer exposed Modifier: Quantity: 1 Physician Procedures : CPT4 Code Description Modifier [id number] 11042 - WC PHYS SUBQ TISS 20 SQ CM ICD-10 Diagnosis Description L97.512 Non-pressure chronic ulcer of other part of right foot with fat layer exposed Quantity: 1 Electronic Signature(s) Signed: 07/17/2022 8:04:19 AM By: Allen Derry PA-C Entered By: Allen Derry on 07/17/2022 08:04:19

## 2022-07-16 NOTE — Progress Notes (Signed)
Kathryn, [name] ([id number]) [id number] Page 1 of 7 Visit Report for 07/16/2022 Arrival Information Details Patient Name: Date of Service: Kathryn [name] NNA L. 07/16/2022 3:30 PM Medical Record Number: [id number] Patient Account Number: [id number] Date of Birth/Sex: Treating RN: April 12, 1933 (89 y.o. Ginette Pitman Primary Care Quinci Gavidia: Ronnald Ramp Other Clinician: Referring Zo Loudon: Treating Avarose Mervine/Extender: [name] in Treatment: 10 Visit Information History Since Last Visit Added or deleted any medications: No Patient Arrived: Wheel Chair Has Dressing in Place as Prescribed: Yes Arrival Time: 15:40 Pain Present Now: No Accompanied By: son Transfer Assistance: None Patient Identification Verified: Yes Secondary Verification Process Completed: Yes Patient Requires Transmission-Based Precautions: No Patient Has Alerts: Yes Patient Alerts: ABI L .82 02/06/22 ABI R .66 02/06/22 Electronic Signature(s) Signed: 07/16/2022 5:08:27 PM By: Midge Aver MSN RN CNS WTA Entered By: Midge Aver on 07/16/2022 16:27:17 -------------------------------------------------------------------------------- Clinic Level of Care Assessment Details Patient Name: Date of Service: Kathryn [name], DEA NNA L. 07/16/2022 3:30 PM Medical Record Number: [id number] Patient Account Number: [id number] Date of Birth/Sex: Treating RN: 04-26-1933 (89 y.o. Ginette Pitman Primary Care Grason Brailsford: Ronnald Ramp Other Clinician: Referring Lendell Gallick: Treating Serine Kea/Extender: Patric Dykes Weeks in Treatment: 10 Clinic Level of Care Assessment Items TOOL 1 Quantity Score []  - 0 Use when EandM and Procedure is performed on INITIAL visit ASSESSMENTS - Nursing Assessment / Reassessment []  - 0 General Physical Exam (combine w/ comprehensive assessment (listed just below) when performed on new pt. evals) []  -  0 Comprehensive Assessment (HX, ROS, Risk Assessments, Wounds Hx, etc.) ASSESSMENTS - Wound and Skin Assessment / Reassessment []  - 0 Dermatologic / Skin Assessment (not related to wound area) ASSESSMENTS - Ostomy and/or Continence Assessment and Care []  - 0 Incontinence Assessment and Management []  - 0 Ostomy Care Assessment and Management (repouching, etc.) PROCESS - Coordination of Care []  - 0 Simple Patient / Family Education for ongoing care []  - 0 Complex (extensive) Patient / Family Education for ongoing care []  - 0 Staff obtains Chiropractor, Records, T Results / Process Orders est []  - 0 Staff telephones HHA, Nursing Homes / Clarify orders / etc []  - 0 Routine Transfer to another Facility (non-emergent condition) []  - 0 Routine Hospital Admission (non-emergent condition) []  - 0 New Admissions / Insurance Authorizations / Ordering NPWT Apligraf, etc. , []  - 0 Emergency Hospital Admission (emergent condition) Citron, [name] ([id number]) [id number] Page 2 of 7 PROCESS - Special Needs []  - 0 Pediatric / Minor Patient Management []  - 0 Isolation Patient Management []  - 0 Hearing / Language / Visual special needs []  - 0 Assessment of Community assistance (transportation, D/C planning, etc.) []  - 0 Additional assistance / Altered mentation []  - 0 Support Surface(s) Assessment (bed, cushion, seat, etc.) INTERVENTIONS - Miscellaneous []  - 0 External ear exam []  - 0 Patient Transfer (multiple staff / Nurse, adult / Similar devices) []  - 0 Simple Staple / Suture removal (25 or less) []  - 0 Complex Staple / Suture removal (26 or more) []  - 0 Hypo/Hyperglycemic Management (do not check if billed separately) []  - 0 Ankle / Brachial Index (ABI) - do not check if billed separately Has the patient been seen at the hospital within the last three years: Yes Total Score: 0 Level Of Care: ____ Electronic Signature(s) Signed: 07/16/2022 5:08:27 PM  By: Midge Aver MSN RN CNS WTA Entered By: Midge Aver on 07/16/2022 16:25:33 -------------------------------------------------------------------------------- Encounter Discharge Information Details Patient Name: Date of Service: Kathryn [name], DEA  NNA L. 07/16/2022 3:30 PM Medical Record Number: [id number] Patient Account Number: [id number] Date of Birth/Sex: Treating RN: 06-15-1933 (89 y.o. Ginette Pitman Primary Care [name]: Ronnald Ramp Other Clinician: Referring [name]: Treating [name]/Extender: [name] in Treatment: 10 Encounter Discharge Information Items Post Procedure Vitals Discharge Condition: Stable Temperature (F): 97.9 Ambulatory Status: Wheelchair Pulse (bpm): 75 Discharge Destination: Home Respiratory Rate (breaths/min): 16 Transportation: Private Auto Blood Pressure (mmHg): 113/49 Accompanied By: son Schedule Follow-up Appointment: Yes Clinical Summary of Care: Electronic Signature(s) Signed: 07/16/2022 5:08:27 PM By: Midge Aver MSN RN CNS WTA Previous Signature: 07/16/2022 4:00:33 PM Version By: Midge Aver MSN RN CNS WTA Entered By: Midge Aver on 07/16/2022 16:26:18 -------------------------------------------------------------------------------- Lower Extremity Assessment Details Patient Name: Date of Service: Kathryn [name], DEA NNA L. 07/16/2022 3:30 PM Medical Record Number: [id number] Patient Account Number: [id number] Date of Birth/Sex: Treating RN: 10-14-1933 (89 y.o. Ginette Pitman Primary Care [name]: Ronnald Ramp Other Clinician: Referring [name]: Treating [name]/Extender: [name], [name] in Treatment: 10 Edema Assessment Assessed: [Left: No] [Right: No] [Left: Edema] [Right: :] H[LeftErline Hau ([id number])] [Right: [id number] Page 3 of 7] Calf Left: Right: Point of Measurement: 32 cm From Medial Instep 39  cm Ankle Left: Right: Point of Measurement: 10 cm From Medial Instep 21 cm Vascular Assessment Pulses: Dorsalis Pedis Palpable: [Right:Yes] Electronic Signature(s) Signed: 07/16/2022 5:08:27 PM By: Midge Aver MSN RN CNS WTA Entered By: Midge Aver on 07/16/2022 15:47:21 -------------------------------------------------------------------------------- Multi Wound Chart Details Patient Name: Date of Service: Kathryn [name], DEA NNA L. 07/16/2022 3:30 PM Medical Record Number: [id number] Patient Account Number: [id number] Date of Birth/Sex: Treating RN: 1933/08/13 (89 y.o. Ginette Pitman Primary Care Takeya Marquis: Ronnald Ramp Other Clinician: Referring Ailea Rhatigan: Treating Twanda Stakes/Extender: [name], [name] in Treatment: 10 Vital Signs Height(in): 64 Pulse(bpm): 75 Weight(lbs): 180 Blood Pressure(mmHg): 113/49 Body Mass Index(BMI): 30.9 Temperature(F): 97.9 Respiratory Rate(breaths/min): 16 [9:Photos:] [N/A:N/A] Right T Second oe N/A N/A Wound Location: Gradually Appeared N/A N/A Wounding Event: Diabetic Wound/Ulcer of the Lower N/A N/A Primary Etiology: Extremity Chronic Obstructive Pulmonary N/A N/A Comorbid History: Disease (COPD), Congestive Heart Failure, Coronary Artery Disease, Hypertension, Peripheral Arterial Disease, Peripheral Venous Disease, Type II Diabetes, Rheumatoid Arthritis, Neuropathy 01/24/2022 N/A N/A Date Acquired: 10 N/A N/A Weeks of Treatment: Open N/A N/A Wound Status: No N/A N/A Wound Recurrence: 0.4x0.5x0.2 N/A N/A Measurements L x W x D (cm) 0.157 N/A N/A A (cm) : rea 0.031 N/A N/A Volume (cm) : -24.60% N/A N/A % Reduction in A rea: 18.40% N/A N/A % Reduction in Volume: Grade 2 N/A N/A Classification: Medium N/A N/A Exudate A mount: Serosanguineous N/A N/A Exudate Type: red, brown N/A N/A Exudate Color: None Present (0%) N/A N/A Granulation A mount: Large (67-100%) N/A N/A Necrotic A  mount: Fat Layer (Subcutaneous Tissue): Yes N/A N/A Exposed StructuresANDRES, ESCANDON L ([id number]) [id number] Page 4 of 7 Fascia: No Tendon: No Muscle: No Joint: No Bone: No None N/A N/A Epithelialization: Debridement - Excisional N/A N/A Debridement: Pre-procedure Verification/Time Out 16:21 N/A N/A Taken: Lidocaine 4% Topical Solution N/A N/A Pain Control: Subcutaneous, Slough N/A N/A Tissue Debrided: Skin/Subcutaneous Tissue N/A N/A Level: 0.16 N/A N/A Debridement A (sq cm): rea Curette N/A N/A Instrument: Minimum N/A N/A Bleeding: Pressure N/A N/A Hemostasis A chieved: 4 N/A N/A Procedural Pain: 4 N/A N/A Post Procedural Pain: Procedure was tolerated well N/A N/A Debridement Treatment Response: 0.4x0.5x0.3 N/A N/A Post Debridement Measurements L x W x D (cm) 0.047 N/A N/A  Post Debridement Volume: (cm) Debridement N/A N/A Procedures Performed: Treatment Notes Wound #9 (Toe Second) Wound Laterality: Right Cleanser Soap and Water Discharge Instruction: Gently cleanse wound with antibacterial soap, rinse and pat dry prior to dressing wounds Peri-Wound Care Topical Primary Dressing Prisma 4.34 (in) Discharge Instruction: Moisten w/normal saline or sterile water; Cover wound as directed. Do not remove from wound bed. Secondary Dressing Coverlet Latex-Free Fabric Adhesive Dressings Discharge Instruction: 1.5 x 2 Secured With Compression Wrap Compression Stockings Add-Ons Electronic Signature(s) Signed: 07/16/2022 5:08:27 PM By: Midge Aver MSN RN CNS WTA Entered By: Midge Aver on 07/16/2022 16:25:16 -------------------------------------------------------------------------------- Multi-Disciplinary Care Plan Details Patient Name: Date of Service: Kathryn [name], DEA NNA L. 07/16/2022 3:30 PM Medical Record Number: [id number] Patient Account Number: [id number] Date of Birth/Sex: Treating RN: 1933-06-22 (89 y.o. Ginette Pitman Primary Care Jayni Prescher: Ronnald Ramp Other Clinician: Referring Cuauhtemoc Huegel: Treating Dionne Rossa/Extender: [name], [name] in Treatment: 10 Active Inactive Wound/Skin Impairment Nursing Diagnoses: Knowledge deficit related to ulceration/compromised skin integrity GoalsTASHAWNDA, BLEILER ([id number]) [phone].pdf Page 5 of 7 Patient/caregiver will verbalize understanding of skin care regimen Date Initiated: 05/07/2022 Date Inactivated: 05/14/2022 Target Resolution Date: 06/05/2022 Goal Status: Met Ulcer/skin breakdown will have a volume reduction of 30% by week 4 Date Initiated: 05/07/2022 Date Inactivated: 07/09/2022 Target Resolution Date: 06/05/2022 Goal Status: Met Ulcer/skin breakdown will have a volume reduction of 50% by week 8 Date Initiated: 05/07/2022 Date Inactivated: 07/09/2022 Target Resolution Date: 07/06/2022 Goal Status: Met Ulcer/skin breakdown will have a volume reduction of 80% by week 12 Date Initiated: 05/07/2022 Target Resolution Date: 08/05/2022 Goal Status: Active Ulcer/skin breakdown will heal within 14 weeks Date Initiated: 05/07/2022 Target Resolution Date: 09/05/2022 Goal Status: Active Interventions: Assess patient/caregiver ability to obtain necessary supplies Assess patient/caregiver ability to perform ulcer/skin care regimen upon admission and as needed Assess ulceration(s) every visit Notes: Electronic Signature(s) Signed: 07/16/2022 3:59:30 PM By: Midge Aver MSN RN CNS WTA Entered By: Midge Aver on 07/16/2022 15:59:30 -------------------------------------------------------------------------------- Pain Assessment Details Patient Name: Date of Service: Kathryn [name], DEA NNA L. 07/16/2022 3:30 PM Medical Record Number: [id number] Patient Account Number: [id number] Date of Birth/Sex: Treating RN: [birth year] (89 y.o. Ginette Pitman Primary Care Kemyah Buser: Ronnald Ramp Other  Clinician: Referring Charnita Trudel: Treating Sayf Kerner/Extender: Patric Dykes Weeks in Treatment: 10 Active Problems Location of Pain Severity and Description of Pain Patient Has Paino No Site Locations Pain Management and Medication Current Pain Management: Electronic Signature(s) Signed: 07/16/2022 5:08:27 PM By: Midge Aver MSN RN CNS WTA Entered By: Midge Aver on 07/16/2022 15:43:30 [name] ([id number]) [id number] Page 6 of 7 -------------------------------------------------------------------------------- Patient/Caregiver Education Details Patient Name: Date of Service: [name] 4/22/2024andnbsp3:30 PM Medical Record Number: [id number] Patient Account Number: [id number] Date of Birth/Gender: Treating RN: April 28, 1933 (89 y.o. Ginette Pitman Primary Care Physician: Ronnald Ramp Other Clinician: Referring Physician: Treating Physician/Extender: [name] in Treatment: 10 Education Assessment Education Provided To: Patient Education Topics Provided Wound/Skin Impairment: Handouts: Caring for Your Ulcer Methods: Explain/Verbal Responses: State content correctly Electronic Signature(s) Signed: 07/16/2022 5:08:27 PM By: Midge Aver MSN RN CNS WTA Entered By: Midge Aver on 07/16/2022 15:59:49 -------------------------------------------------------------------------------- Wound Assessment Details Patient Name: Date of Service: Kathryn [name], DEA NNA L. 07/16/2022 3:30 PM Medical Record Number: [id number] Patient Account Number: [id number] Date of Birth/Sex: Treating RN: 1933-10-13 (89 y.o. Ginette Pitman Primary Care Lequan Dobratz: Ronnald Ramp Other Clinician: Referring Keyara Ent: Treating Wilgus Deyton/Extender: [name], [name] in Treatment: 10 Wound Status  Wound Number: 9 Primary Diabetic Wound/Ulcer of the Lower  Extremity Etiology: Wound Location: Right T Second oe Wound Open Wounding Event: Gradually Appeared Status: Date Acquired: 01/24/2022 Comorbid Chronic Obstructive Pulmonary Disease (COPD), Congestive Heart Weeks Of Treatment: 10 History: Failure, Coronary Artery Disease, Hypertension, Peripheral Arterial Clustered Wound: No Disease, Peripheral Venous Disease, Type II Diabetes, Rheumatoid Arthritis, Neuropathy Photos Wound Measurements Length: (cm) 0.4 Width: (cm) 0.5 Depth: (cm) 0.2 Area: (cm) 0.157 Volume: (cm) 0.031 % Reduction in Area: -24.6% % Reduction in Volume: 18.4% Epithelialization: None Wound Description Classification: Grade 2 Exudate Amount: Medium Exudate Type: Serosanguineous Tromp, Rudell L ([id number]) Exudate Color: red, brown Foul Odor After Cleansing: No Slough/Fibrino Yes [phone].pdf Page 7 of 7 Wound Bed Granulation Amount: None Present (0%) Exposed Structure Necrotic Amount: Large (67-100%) Fascia Exposed: No Fat Layer (Subcutaneous Tissue) Exposed: Yes Tendon Exposed: No Muscle Exposed: No Joint Exposed: No Bone Exposed: No Treatment Notes Wound #9 (Toe Second) Wound Laterality: Right Cleanser Soap and Water Discharge Instruction: Gently cleanse wound with antibacterial soap, rinse and pat dry prior to dressing wounds Peri-Wound Care Topical Primary Dressing Prisma 4.34 (in) Discharge Instruction: Moisten w/normal saline or sterile water; Cover wound as directed. Do not remove from wound bed. Secondary Dressing Coverlet Latex-Free Fabric Adhesive Dressings Discharge Instruction: 1.5 x 2 Secured With Compression Wrap Compression Stockings Add-Ons Electronic Signature(s) Signed: 07/16/2022 5:08:27 PM By: Midge Aver MSN RN CNS WTA Entered By: Midge Aver on 07/16/2022 15:46:56 -------------------------------------------------------------------------------- Vitals Details Patient Name: Date of  Service: Kathryn [name], DEA NNA L. 07/16/2022 3:30 PM Medical Record Number: [id number] Patient Account Number: [id number] Date of Birth/Sex: Treating RN: 1933/10/05 (89 y.o. Ginette Pitman Primary Care Jasmain Ahlberg: Ronnald Ramp Other Clinician: Referring Dominic Mahaney: Treating Katheleen Stella/Extender: [name], [name] in Treatment: 10 Vital Signs Time Taken: 15:41 Temperature (F): 97.9 Height (in): 64 Pulse (bpm): 75 Weight (lbs): 180 Respiratory Rate (breaths/min): 16 Body Mass Index (BMI): 30.9 Blood Pressure (mmHg): 113/49 Reference Range: 80 - 120 mg / dl Electronic Signature(s) Signed: 07/16/2022 5:08:27 PM By: Midge Aver MSN RN CNS WTA Entered By: Midge Aver on 07/16/2022 15:42:56

## 2022-07-17 ENCOUNTER — Other Ambulatory Visit (HOSPITAL_COMMUNITY): Payer: Self-pay | Admitting: Physician Assistant

## 2022-07-17 DIAGNOSIS — L97512 Non-pressure chronic ulcer of other part of right foot with fat layer exposed: Secondary | ICD-10-CM

## 2022-07-17 DIAGNOSIS — E11621 Type 2 diabetes mellitus with foot ulcer: Secondary | ICD-10-CM

## 2022-07-17 NOTE — Telephone Encounter (Signed)
Requested Prescriptions  Pending Prescriptions Disp Refills   hydrOXYzine (ATARAX) 10 MG tablet [Pharmacy Med Name: HYDROXYZINE HCL  TABLETS] 180 tablet 0    Sig: TAKE 1 TO 2 TABLETS(10 TO 20 MG) BY MOUTH EVERY 6 HOURS AS NEEDED     Ear, Nose, and Throat:  Antihistamines 2 Failed - 07/16/2022 10:11 AM      Failed - Cr in normal range and within 360 days    Creatinine  Date Value Ref Range Status  07/09/2014 0.85 mg/dL Final    Comment:    7.82-9.56 NOTE: New Reference Range  06/01/14    Creatinine, Ser  Date Value Ref Range Status  07/13/2022 1.21 (H) 0.44 - 1.00 mg/dL Final         Passed - Valid encounter within last 12 months    Recent Outpatient Visits           3 months ago Hospital discharge follow-up   [facility] [name], [place], MD   6 months ago Lower limb ulcer, ankle, right, with unspecified severity [facility])   [place] Caro Laroche, DO   7 months ago Need for immunization against influenza   [facility] Bosie Clos, MD   8 months ago Postherpetic neuralgia   [facility] Bosie Clos, MD   8 months ago Postherpetic neuralgia   [facility] Bosie Clos, MD       Future Appointments             In 1 week [name], Tawanna Cooler, MD [facility]

## 2022-07-20 ENCOUNTER — Telehealth: Payer: Self-pay | Admitting: Family Medicine

## 2022-07-23 ENCOUNTER — Encounter: Payer: Medicare HMO | Admitting: Family

## 2022-07-24 ENCOUNTER — Encounter: Payer: Medicare HMO | Admitting: Physician Assistant

## 2022-07-24 DIAGNOSIS — E11621 Type 2 diabetes mellitus with foot ulcer: Secondary | ICD-10-CM | POA: Diagnosis not present

## 2022-07-25 ENCOUNTER — Ambulatory Visit
Admission: RE | Admit: 2022-07-25 | Discharge: 2022-07-25 | Disposition: A | Discharge status: Home / Self Care | Payer: Medicare HMO | Source: Ambulatory Visit | Attending: Physician Assistant | Admitting: Physician Assistant

## 2022-07-25 DIAGNOSIS — L97509 Non-pressure chronic ulcer of other part of unspecified foot with unspecified severity: Secondary | ICD-10-CM | POA: Insufficient documentation

## 2022-07-25 DIAGNOSIS — L97512 Non-pressure chronic ulcer of other part of right foot with fat layer exposed: Secondary | ICD-10-CM | POA: Diagnosis present

## 2022-07-25 DIAGNOSIS — E11621 Type 2 diabetes mellitus with foot ulcer: Secondary | ICD-10-CM | POA: Insufficient documentation

## 2022-07-25 MED ORDER — GADOBUTROL 1 MMOL/ML IV SOLN
7.5000 mL | Freq: Once | INTRAVENOUS | Status: AC | PRN
  Administered 2022-07-25: 7.5 mL via INTRAVENOUS

## 2022-07-25 NOTE — Progress Notes (Signed)
LYNELLE, WEILER ([id number]) [id number] Page 1 of 7 Visit Report for 07/24/2022 Chief Complaint Document Details Patient Name: Date of Service: Kathryn [name] 07/24/2022 3:45 PM Medical Record Number: [id number] Patient Account Number: [id number] Date of Birth/Sex: Treating RN: 1933/08/04 (89 y.o. Freddy Finner Primary Care Provider: Ronnald Ramp Other Clinician: Betha Loa Referring Provider: Treating Provider/Extender: Patric Dykes Weeks in Treatment: 11 Information Obtained from: Patient Chief Complaint Right second toe ulcer Electronic Signature(s) Signed: 07/24/2022 4:27:37 PM By: Allen Derry PA-C Entered By: Allen Derry on 07/24/2022 16:27:37 -------------------------------------------------------------------------------- Debridement Details Patient Name: Date of Service: Kathryn [name], [name] 07/24/2022 3:45 PM Medical Record Number: [id number] Patient Account Number: [id number] Date of Birth/Sex: Treating RN: 31-Oct-1933 (89 y.o. Freddy Finner Primary Care Provider: Ronnald Ramp Other Clinician: Betha Loa Referring Provider: Treating Provider/Extender: Patric Dykes Weeks in Treatment: 11 Debridement Performed for Assessment: Wound #9 Right T Second oe Performed By: Physician Allen Derry, PA-C Debridement Type: Debridement Severity of Tissue Pre Debridement: Fat layer exposed Level of Consciousness (Pre-procedure): Awake and Alert Pre-procedure Verification/Time Out Yes - 16:44 Taken: Start Time: 16:44 Percent of Wound Bed Debrided: 100% T Area Debrided (cm): otal 0.31 Tissue and other material debrided: Viable, Non-Viable, Slough, Subcutaneous, Slough Level: Skin/Subcutaneous Tissue Debridement Description: Excisional Instrument: Curette Bleeding: Moderate Hemostasis Achieved: Pressure Response to Treatment: Procedure was tolerated well Level of  Consciousness (Post- Awake and Alert procedure): Post Debridement Measurements of Total Wound Length: (cm) 0.5 Width: (cm) 0.8 Depth: (cm) 0.3 Volume: (cm) 0.094 Character of Wound/Ulcer Post Debridement: Stable Severity of Tissue Post Debridement: Fat layer exposed Post Procedure Diagnosis Same as Pre-procedure Electronic Signature(s) Signed: 07/24/2022 5:12:01 PM By: Betha Loa Signed: 07/24/2022 6:18:39 PM By: Allen Derry PA-C Signed: 07/25/2022 9:40:32 AM By: Yevonne Pax RN Entered By: Betha Loa on 07/24/2022 16:46:06 Kathryn [name], Kathryn [name] ([id number]) [id number] Page 2 of 7 -------------------------------------------------------------------------------- HPI Details Patient Name: Date of Service: Kathryn [name] 07/24/2022 3:45 PM Medical Record Number: [id number] Patient Account Number: [id number] Date of Birth/Sex: Treating RN: [birth year] (89 y.o. Freddy Finner Primary Care Provider: Ronnald Ramp Other Clinician: Betha Loa Referring Provider: Treating Provider/Extender: Patric Dykes Weeks in Treatment: 11 History of Present Illness HPI Description: 02-06-2022 upon evaluation today patient presents for initial inspection here in our clinic concerning wounds that she has over the bilateral feet as well as the right leg. She has been tolerating the dressing changes in general here without complication she has been putting antibiotic ointment on this. With that being said I do believe that she would benefit from the use of something like Xeroform gauze dressings which would likely be a bit better. Fortunately I do not see any signs of infection which is good news do not think she needs any antibiotics. She tells me that these wounds typically occur as a result of her bumping or hitting her leg and then they slowly progressed from there. Patient does have a history of diabetes for which her hemoglobin A1c  was 6.9 measured on 12-12-2021. She also has a history of arterial insufficiency with a right ABI of 0.66 with a TBI of 0.61 and a left ABI of 0.82 with a TBI of 0.45. She has a follow-up evaluation on 02-27-2022 with vascular. This testing was done on 01-28-2022. Subsequently the patient also has a history of hypertension, generalized weakness, congestive heart failure, and lower extremity edema. 02-13-2022 upon evaluation today patient appears to be doing well  currently in regard to her wounds. She is actually making some good progress here which is great news and overall I am extremely pleased with where we stand. I do not see any evidence of worsening. 02-22-2022 upon evaluation today patient's wounds actually are showing some signs of improvement for the most part. Fortunately there does not appear to be any evidence of infection locally nor systemically at this point which is great news. No fevers, chills, nausea, vomiting, or diarrhea. She does have an a vascular appointment on December 5 and we will see what they have to say at that point. 03-08-2022 upon evaluation today patient actually appears to be doing excellent in regards to her wounds. She has been tolerating the dressing changes without complication. Fortunately there is no sign of active infection locally nor systemically at this point which is great news. Readmission: 05-07-2022 upon evaluation today patient presents for reevaluation here in the clinic it has been actually December 14 since I have last seen her. Subsequently she does still have a wound on the right second toe. This is the one area that has not healed everything on the left ended up healing. Fortunately I do not see any signs of infection at this point and really nothing is changed in her medical history since the last evaluation. She just tells me that this wound is just being stubborn and does not want to heal. 05-14-2022 upon evaluation today patient appears to be  doing poorly in regard to her toe ulcer. Unfortunately she is showing signs of this being a little worse than last week. I was hopeful that this would actually show some signs of improvement but were definitely not seeing that. I think we probably need to switch to a different dressing, recommend Iodosorb/Iodoflex as an option here for her. 05-21-2022 upon evaluation today patient appears to be doing well currently in regard to her toe ulcer. I did review her culture as well as her x-ray both are negative at this point. There is no growth after 3 days. With that being said that she has been exposed this actually is looking much better Is read as it was and overall much happier with where things stand currently. I do not see any signs of infection locally nor systemically at this point. 3/4; this is a patient with a punched out area on the dorsal aspect of her right second toe in the inner phalangeal joint area. She had a previous 1 that healed on the left second toe. She saw Dr. Gilda Crease at [facility]. He did not feel she required any surgical procedure suggested venous. We have been using Iodoflex 3/12; patient presents for follow-up. She has been using Iodoflex to the second right toe. She has no issues or complaints today. 06-12-2022 upon evaluation today patient appears to be doing well currently in regard to her wound. She actually seems to be making some progress the Iodoflex seems to be doing a pretty good job here. Fortunately I do not see any evidence of active infection locally nor systemically which is great news. No fevers, chills, nausea, vomiting, or diarrhea. 06-19-2022 upon evaluation today patient appears to be doing well currently in regard to her toe ulcer actually feel like this is getting cleaner and I am very pleased in that regard. I do not see any signs of infection locally or systemically. 06-25-2022 upon evaluation today patient appears to be doing well currently in regard  to her wound. She is going require little bit  of sharp debridement but fortunately I am seeing some definite improvements here. I do not see any signs of infection locally nor systemically which is great news. No fevers, chills, nausea, vomiting, or diarrhea. 07-09-2022 upon evaluation today patient appears to be doing well currently in regard to her wound. She is actually showing signs of improvement I think the collagen is doing a good job. I do not see any signs of active infection locally nor systemically at this point. No fevers, chills, nausea, vomiting, or diarrhea. 07-16-2022 upon evaluation today patient appears to be doing poorly currently in regard to her toe. It does not look like she is having any signs of infection systemically but locally I do feel like there may be some inflammation at least in the question is is there is some infection there is definitely bone still exposed in the base of the wound not even sure there is not more of this and covering at this point. Fortunately I do not see that the redness seems to be spreading but the toe does seem a little bit more swollen. I think I am going to place her on antibiotics today which I discussed with her as well. Also believe that we probably need to consider an MRI to see where things stand. 07-24-2022 upon evaluation today patient appears to be doing okay currently in regard to her wound. She has been tolerating the dressing changes without complication. Fortunately there does not appear to be any signs of active infection locally nor systemically which is great news. No fevers, chills, nausea, vomiting, or diarrhea. Electronic Signature(s) Signed: 07/24/2022 5:28:05 PM By: Allen Derry PA-C Entered By: Allen Derry on 07/24/2022 17:28:05 Kathryn [name] ([id number]) [id number] Page 3 of 7 -------------------------------------------------------------------------------- Physical Exam Details Patient Name:  Date of Service: Kathryn [name] 07/24/2022 3:45 PM Medical Record Number: [id number] Patient Account Number: [id number] Date of Birth/Sex: Treating RN: 24-Aug-1933 (89 y.o. Freddy Finner Primary Care Provider: Ronnald Ramp Other Clinician: Betha Loa Referring Provider: Treating Provider/Extender: [name], [name] in Treatment: 11 Constitutional Well-nourished and well-hydrated in no acute distress. Respiratory normal breathing without difficulty. Psychiatric this patient is able to make decisions and demonstrates good insight into disease process. Alert and Oriented x 3. pleasant and cooperative. Notes Upon inspection patient still has some erythema of the toe and bone is still exposed. Again my concern is that of osteomyelitis and this is something that we are still working on trying to fully determine if she has an MRI tomorrow evening around 1:00 PM. Electronic Signature(s) Signed: 07/24/2022 5:28:37 PM By: Allen Derry PA-C Entered By: Allen Derry on 07/24/2022 17:28:36 -------------------------------------------------------------------------------- Physician Orders Details Patient Name: Date of Service: Kathryn [name], [name] 07/24/2022 3:45 PM Medical Record Number: [id number] Patient Account Number: [id number] Date of Birth/Sex: Treating RN: [birth year] (89 y.o. Freddy Finner Primary Care Provider: Ronnald Ramp Other Clinician: Betha Loa Referring Provider: Treating Provider/Extender: [name] in Treatment: 42 Verbal / Phone Orders: Yes Clinician: Yevonne Pax Read Back and Verified: Yes Diagnosis Coding ICD-10 Coding Code Description E11.621 Type 2 diabetes mellitus with foot ulcer I73.89 Other specified peripheral vascular diseases L97.512 Non-pressure chronic ulcer of other part of right foot with fat layer exposed M62.81 Muscle weakness (generalized) I50.42 Chronic  combined systolic (congestive) and diastolic (congestive) heart failure I10 Essential (primary) hypertension Follow-up Appointments Return Appointment in 1 week. Bathing/ Shower/ Hygiene May shower; gently cleanse wound with antibacterial soap, rinse and pat dry  prior to dressing wounds Anesthetic (Use 'Patient Medications' Section for Anesthetic Order Entry) Lidocaine applied to wound bed Edema Control - Lymphedema / Segmental Compressive Device / Other Elevate, Exercise Daily and A void Standing for Long Periods of Time. Elevate legs to the level of the heart and pump ankles as often as possible Elevate leg(s) parallel to the floor when sitting. DO YOUR BEST to sleep in the bed at night. DO NOT sleep in your recliner. Long hours of sitting in a recliner leads to swelling of the legs and/or potential wounds on your backside. Wound Treatment Wound #9 - T Second oe Wound Laterality: Right Cleanser: Soap and Water 3 x Per Week/30 Days Discharge Instructions: Gently cleanse wound with antibacterial soap, rinse and pat dry prior to dressing wounds Prim Dressing: Prisma 4.34 (in) 3 x Per Week/30 Days ary Discharge Instructions: Moisten w/normal saline or sterile water; Cover wound as directed. Do not remove from wound bed. Kathryn [name], Kathryn [name] ([id number]) [id number] Page 4 of 7 Secondary Dressing: Coverlet Latex-Free Fabric Adhesive Dressings 3 x Per Week/30 Days Discharge Instructions: 1.5 x 2 Electronic Signature(s) Signed: 07/24/2022 5:12:01 PM By: Betha Loa Signed: 07/24/2022 6:18:39 PM By: Allen Derry PA-C Entered By: Betha Loa on 07/24/2022 16:46:19 -------------------------------------------------------------------------------- Problem List Details Patient Name: Date of Service: Kathryn [name], [name] 07/24/2022 3:45 PM Medical Record Number: [id number] Patient Account Number: [id number] Date of Birth/Sex: Treating RN: 1933/09/05 (89 y.o. Freddy Finner Primary Care Provider: Ronnald Ramp Other Clinician: Betha Loa Referring Provider: Treating Provider/Extender: Patric Dykes Weeks in Treatment: 11 Active Problems ICD-10 Encounter Code Description Active Date MDM Diagnosis E11.621 Type 2 diabetes mellitus with foot ulcer 05/07/2022 No Yes I73.89 Other specified peripheral vascular diseases 05/07/2022 No Yes L97.512 Non-pressure chronic ulcer of other part of right foot with fat layer exposed 05/07/2022 No Yes M62.81 Muscle weakness (generalized) 05/07/2022 No Yes I50.42 Chronic combined systolic (congestive) and diastolic (congestive) heart failure 05/07/2022 No Yes I10 Essential (primary) hypertension 05/07/2022 No Yes Inactive Problems Resolved Problems Electronic Signature(s) Signed: 07/24/2022 4:27:34 PM By: Allen Derry PA-C Entered By: Allen Derry on 07/24/2022 16:27:34 -------------------------------------------------------------------------------- Progress Note Details Patient Name: Date of Service: Kathryn [name], [name] 07/24/2022 3:45 PM Medical Record Number: [id number] Patient Account Number: [id number] Date of Birth/Sex: Treating RN: 1933/06/15 (89 y.o. Freddy Finner Primary Care Provider: Ronnald Ramp Other Clinician: Betha Loa Referring Provider: Treating Provider/Extender: [name] in Treatment: [address], Kathryn [name] ([id number]) [id number] Page 5 of 7 Subjective Chief Complaint Information obtained from Patient Right second toe ulcer History of Present Illness (HPI) 02-06-2022 upon evaluation today patient presents for initial inspection here in our clinic concerning wounds that she has over the bilateral feet as well as the right leg. She has been tolerating the dressing changes in general here without complication she has been putting antibiotic ointment on this. With that being said I do  believe that she would benefit from the use of something like Xeroform gauze dressings which would likely be a bit better. Fortunately I do not see any signs of infection which is good news do not think she needs any antibiotics. She tells me that these wounds typically occur as a result of her bumping or hitting her leg and then they slowly progressed from there. Patient does have a history of diabetes for which her hemoglobin A1c was 6.9 measured on 12-12-2021. She also has a history of arterial insufficiency with a right ABI of 0.66  with a TBI of 0.61 and a left ABI of 0.82 with a TBI of 0.45. She has a follow-up evaluation on 02-27-2022 with vascular. This testing was done on 01-28-2022. Subsequently the patient also has a history of hypertension, generalized weakness, congestive heart failure, and lower extremity edema. 02-13-2022 upon evaluation today patient appears to be doing well currently in regard to her wounds. She is actually making some good progress here which is great news and overall I am extremely pleased with where we stand. I do not see any evidence of worsening. 02-22-2022 upon evaluation today patient's wounds actually are showing some signs of improvement for the most part. Fortunately there does not appear to be any evidence of infection locally nor systemically at this point which is great news. No fevers, chills, nausea, vomiting, or diarrhea. She does have an a vascular appointment on December 5 and we will see what they have to say at that point. 03-08-2022 upon evaluation today patient actually appears to be doing excellent in regards to her wounds. She has been tolerating the dressing changes without complication. Fortunately there is no sign of active infection locally nor systemically at this point which is great news. Readmission: 05-07-2022 upon evaluation today patient presents for reevaluation here in the clinic it has been actually December 14 since I have last seen  her. Subsequently she does still have a wound on the right second toe. This is the one area that has not healed everything on the left ended up healing. Fortunately I do not see any signs of infection at this point and really nothing is changed in her medical history since the last evaluation. She just tells me that this wound is just being stubborn and does not want to heal. 05-14-2022 upon evaluation today patient appears to be doing poorly in regard to her toe ulcer. Unfortunately she is showing signs of this being a little worse than last week. I was hopeful that this would actually show some signs of improvement but were definitely not seeing that. I think we probably need to switch to a different dressing, recommend Iodosorb/Iodoflex as an option here for her. 05-21-2022 upon evaluation today patient appears to be doing well currently in regard to her toe ulcer. I did review her culture as well as her x-ray both are negative at this point. There is no growth after 3 days. With that being said that she has been exposed this actually is looking much better Is read as it was and overall much happier with where things stand currently. I do not see any signs of infection locally nor systemically at this point. 3/4; this is a patient with a punched out area on the dorsal aspect of her right second toe in the inner phalangeal joint area. She had a previous 1 that healed on the left second toe. She saw Dr. Gilda Crease at [facility] vascular. He did not feel she required any surgical procedure suggested venous. We have been using Iodoflex 3/12; patient presents for follow-up. She has been using Iodoflex to the second right toe. She has no issues or complaints today. 06-12-2022 upon evaluation today patient appears to be doing well currently in regard to her wound. She actually seems to be making some progress the Iodoflex seems to be doing a pretty good job here. Fortunately I do not see any evidence of active  infection locally nor systemically which is great news. No fevers, chills, nausea, vomiting, or diarrhea. 06-19-2022 upon evaluation today patient appears  to be doing well currently in regard to her toe ulcer actually feel like this is getting cleaner and I am very pleased in that regard. I do not see any signs of infection locally or systemically. 06-25-2022 upon evaluation today patient appears to be doing well currently in regard to her wound. She is going require little bit of sharp debridement but fortunately I am seeing some definite improvements here. I do not see any signs of infection locally nor systemically which is great news. No fevers, chills, nausea, vomiting, or diarrhea. 07-09-2022 upon evaluation today patient appears to be doing well currently in regard to her wound. She is actually showing signs of improvement I think the collagen is doing a good job. I do not see any signs of active infection locally nor systemically at this point. No fevers, chills, nausea, vomiting, or diarrhea. 07-16-2022 upon evaluation today patient appears to be doing poorly currently in regard to her toe. It does not look like she is having any signs of infection systemically but locally I do feel like there may be some inflammation at least in the question is is there is some infection there is definitely bone still exposed in the base of the wound not even sure there is not more of this and covering at this point. Fortunately I do not see that the redness seems to be spreading but the toe does seem a little bit more swollen. I think I am going to place her on antibiotics today which I discussed with her as well. Also believe that we probably need to consider an MRI to see where things stand. 07-24-2022 upon evaluation today patient appears to be doing okay currently in regard to her wound. She has been tolerating the dressing changes without complication. Fortunately there does not appear to be any signs of  active infection locally nor systemically which is great news. No fevers, chills, nausea, vomiting, or diarrhea. Objective Constitutional Well-nourished and well-hydrated in no acute distress. Vitals Time Taken: 4:21 PM, Height: 64 in, Weight: 180 lbs, BMI: 30.9, Temperature: 97.9 F, Pulse: 78 bpm, Respiratory Rate: 18 breaths/min, Blood Pressure: 132/69 mmHg. Kathryn [name], Kathryn [name] ([id number]) [id number] Page 6 of 7 Respiratory normal breathing without difficulty. Psychiatric this patient is able to make decisions and demonstrates good insight into disease process. Alert and Oriented x 3. pleasant and cooperative. General Notes: Upon inspection patient still has some erythema of the toe and bone is still exposed. Again my concern is that of osteomyelitis and this is something that we are still working on trying to fully determine if she has an MRI tomorrow evening around 1:00 PM. Integumentary (Hair, Skin) Wound #9 status is Open. Original cause of wound was Gradually Appeared. The date acquired was: 01/24/2022. The wound has been in treatment 11 weeks. The wound is located on the Right T Second. The wound measures 0.5cm length x 0.8cm width x 0.3cm depth; 0.314cm^2 area and 0.094cm^3 volume. There is oe Fat Layer (Subcutaneous Tissue) exposed. There is a medium amount of serosanguineous drainage noted. There is no granulation within the wound bed. There is a large (67-100%) amount of necrotic tissue within the wound bed. Assessment Active Problems ICD-10 Type 2 diabetes mellitus with foot ulcer Other specified peripheral vascular diseases Non-pressure chronic ulcer of other part of right foot with fat layer exposed Muscle weakness (generalized) Chronic combined systolic (congestive) and diastolic (congestive) heart failure Essential (primary) hypertension Procedures Wound #9 Pre-procedure diagnosis of Wound #9 is a Diabetic Wound/Ulcer of  the Lower Extremity  located on the Right T Second .Severity of Tissue Pre Debridement oe is: Fat layer exposed. There was a Excisional Skin/Subcutaneous Tissue Debridement with a total area of 0.31 sq cm performed by Allen Derry, PA-C. With the following instrument(s): Curette to remove Viable and Non-Viable tissue/material. Material removed includes Subcutaneous Tissue and Slough and. A time out was conducted at 16:44, prior to the start of the procedure. A Moderate amount of bleeding was controlled with Pressure. The procedure was tolerated well. Post Debridement Measurements: 0.5cm length x 0.8cm width x 0.3cm depth; 0.094cm^3 volume. Character of Wound/Ulcer Post Debridement is stable. Severity of Tissue Post Debridement is: Fat layer exposed. Post procedure Diagnosis Wound #9: Same as Pre-Procedure Plan Follow-up Appointments: Return Appointment in 1 week. Bathing/ Shower/ Hygiene: May shower; gently cleanse wound with antibacterial soap, rinse and pat dry prior to dressing wounds Anesthetic (Use 'Patient Medications' Section for Anesthetic Order Entry): Lidocaine applied to wound bed Edema Control - Lymphedema / Segmental Compressive Device / Other: Elevate, Exercise Daily and Avoid Standing for Long Periods of Time. Elevate legs to the level of the heart and pump ankles as often as possible Elevate leg(s) parallel to the floor when sitting. DO YOUR BEST to sleep in the bed at night. DO NOT sleep in your recliner. Long hours of sitting in a recliner leads to swelling of the legs and/or potential wounds on your backside. WOUND #9: - T Second Wound Laterality: Right oe Cleanser: Soap and Water 3 x Per Week/30 Days Discharge Instructions: Gently cleanse wound with antibacterial soap, rinse and pat dry prior to dressing wounds Prim Dressing: Prisma 4.34 (in) 3 x Per Week/30 Days ary Discharge Instructions: Moisten w/normal saline or sterile water; Cover wound as directed. Do not remove from wound  bed. Secondary Dressing: Coverlet Latex-Free Fabric Adhesive Dressings 3 x Per Week/30 Days Discharge Instructions: 1.5 x 2 1. Based on what I am seeing I do believe that the patient is doing okay at this point. I do not feel like the infection is spreading but also not feel like she is improving quite as much as we would like to see. Fortunately I do not see any signs of active infection locally nor systemically which is great news. 2. She does have an MRI tomorrow this should help identify some of the issues going on at this point and will be able to see where things go following. This will help [place] determine if we need to consider HBO therapy or something else. 3. I am good recommend as well that the patient should continue to monitor for any evidence of infection or worsening. I am concerned about the possibility of osteomyelitis but at the same time we will get a hold off on making that final determination until the results of the MRI come back. We will see patient back for reevaluation in 1 week here in the clinic. If anything worsens or changes patient will contact our office for additional recommendations. Kathryn [name], Kathryn [name] ([id number]) [id number] Page 7 of 7 Electronic Signature(s) Signed: 07/24/2022 5:40:56 PM By: Allen Derry PA-C Entered By: Allen Derry on 07/24/2022 17:40:56 -------------------------------------------------------------------------------- SuperBill Details Patient Name: Date of Service: Kathryn [name], [name] 07/24/2022 Medical Record Number: [id number] Patient Account Number: [id number] Date of Birth/Sex: Treating RN: 02/23/1934 (89 y.o. Freddy Finner Primary Care Provider: Ronnald Ramp Other Clinician: Betha Loa Referring Provider: Treating Provider/Extender: Patric Dykes Weeks in Treatment: 11 Diagnosis Coding ICD-10 Codes Code Description E11.621 Type  2 diabetes mellitus with foot  ulcer I73.89 Other specified peripheral vascular diseases L97.512 Non-pressure chronic ulcer of other part of right foot with fat layer exposed M62.81 Muscle weakness (generalized) I50.42 Chronic combined systolic (congestive) and diastolic (congestive) heart failure I10 Essential (primary) hypertension Facility Procedures : CPT4 Code: 16109604 Description: 11042 - DEB SUBQ TISSUE 20 SQ CM/< ICD-10 Diagnosis Description L97.512 Non-pressure chronic ulcer of other part of right foot with fat layer exposed Modifier: Quantity: 1 Physician Procedures : CPT4 Code Description Modifier [id number] 11042 - WC PHYS SUBQ TISS 20 SQ CM ICD-10 Diagnosis Description L97.512 Non-pressure chronic ulcer of other part of right foot with fat layer exposed Quantity: 1 Electronic Signature(s) Signed: 07/24/2022 5:41:15 PM By: Allen Derry PA-C Entered By: Allen Derry on 07/24/2022 17:41:14

## 2022-07-26 NOTE — Progress Notes (Signed)
KENZEE, BASSIN ([id number]) [id number] Page 1 of 7 Visit Report for 07/24/2022 Arrival Information Details Patient Name: Date of Service: Kathryn [name] NNA Kathryn [name] 07/24/2022 3:45 PM Medical Record Number: [id number] Patient Account Number: [id number] Date of Birth/Sex: Treating RN: 07-05-1933 (89 y.o. Freddy Finner Primary Care Shannon Balthazar: Ronnald Ramp Other Clinician: Betha Loa Referring Ananth Fiallos: Treating Nava Song/Extender: [name] in Treatment: 11 Visit Information History Since Last Visit All ordered tests and consults were completed: No Patient Arrived: Wheel Chair Added or deleted any medications: No Arrival Time: 16:17 Any new allergies or adverse reactions: No Transfer Assistance: EasyPivot Patient Lift Had a fall or experienced change in No Patient Identification Verified: Yes activities of daily living that may affect Secondary Verification Process Completed: Yes risk of falls: Patient Requires Transmission-Based Precautions: No Signs or symptoms of abuse/neglect since last visito No Patient Has Alerts: Yes Hospitalized since last visit: No Patient Alerts: ABI L .82 02/06/22 Implantable device outside of the clinic excluding No ABI R .66 02/06/22 cellular tissue based products placed in the center since last visit: Has Dressing in Place as Prescribed: Yes Pain Present Now: Yes Electronic Signature(s) Signed: 07/24/2022 5:12:01 PM By: Betha Loa Entered By: Betha Loa on 07/24/2022 16:20:16 -------------------------------------------------------------------------------- Clinic Level of Care Assessment Details Patient Name: Date of Service: Kathryn [name] 07/24/2022 3:45 PM Medical Record Number: [id number] Patient Account Number: [id number] Date of Birth/Sex: Treating RN: September 22, 1933 (89 y.o. Freddy Finner Primary Care Laverne Klugh: Ronnald Ramp Other Clinician:  Betha Loa Referring Iyanla Eilers: Treating Desaree Downen/Extender: Patric Dykes Weeks in Treatment: 11 Clinic Level of Care Assessment Items TOOL 1 Quantity Score []  - 0 Use when EandM and Procedure is performed on INITIAL visit ASSESSMENTS - Nursing Assessment / Reassessment []  - 0 General Physical Exam (combine w/ comprehensive assessment (listed just below) when performed on new pt. evals) []  - 0 Comprehensive Assessment (HX, ROS, Risk Assessments, Wounds Hx, etc.) ASSESSMENTS - Wound and Skin Assessment / Reassessment []  - 0 Dermatologic / Skin Assessment (not related to wound area) ASSESSMENTS - Ostomy and/or Continence Assessment and Care []  - 0 Incontinence Assessment and Management []  - 0 Ostomy Care Assessment and Management (repouching, etc.) PROCESS - Coordination of Care []  - 0 Simple Patient / Family Education for ongoing care []  - 0 Complex (extensive) Patient / Family Education for ongoing care []  - 0 Staff obtains Chiropractor, Records, T Results / Process Orders est []  - 0 Staff telephones HHA, Nursing Homes / Clarify orders / etc []  - 0 Routine Transfer to another Facility (non-emergent condition) []  - 0 Routine Hospital Admission (non-emergent condition) LAKITA, [name] L ([id number]) [id number] Page 2 of 7 []  - 0 New Admissions / Manufacturing engineer / Ordering NPWT Apligraf, etc. , []  - 0 Emergency Hospital Admission (emergent condition) PROCESS - Special Needs []  - 0 Pediatric / Minor Patient Management []  - 0 Isolation Patient Management []  - 0 Hearing / Language / Visual special needs []  - 0 Assessment of Community assistance (transportation, D/C planning, etc.) []  - 0 Additional assistance / Altered mentation []  - 0 Support Surface(s) Assessment (bed, cushion, seat, etc.) INTERVENTIONS - Miscellaneous []  - 0 External ear exam []  - 0 Patient Transfer (multiple staff / Nurse, adult / Similar  devices) []  - 0 Simple Staple / Suture removal (25 or less) []  - 0 Complex Staple / Suture removal (26 or more) []  - 0 Hypo/Hyperglycemic Management (do not check if billed separately) []  - 0 Ankle /  Brachial Index (ABI) - do not check if billed separately Has the patient been seen at the hospital within the last three years: Yes Total Score: 0 Level Of Care: ____ Electronic Signature(s) Signed: 07/24/2022 5:12:01 PM By: Betha Loa Entered By: Betha Loa on 07/24/2022 16:46:28 -------------------------------------------------------------------------------- Encounter Discharge Information Details Patient Name: Date of Service: Kathryn [name], DEA NNA L. 07/24/2022 3:45 PM Medical Record Number: [id number] Patient Account Number: [id number] Date of Birth/Sex: Treating RN: 1933/12/10 (89 y.o. Freddy Finner Primary Care Abdel Effinger: Ronnald Ramp Other Clinician: Betha Loa Referring Lylith Bebeau: Treating Pranit Owensby/Extender: [name] in Treatment: 11 Encounter Discharge Information Items Post Procedure Vitals Discharge Condition: Stable Temperature (F): 97.9 Ambulatory Status: Wheelchair Pulse (bpm): 78 Discharge Destination: Home Respiratory Rate (breaths/min): 16 Transportation: Private Auto Blood Pressure (mmHg): 132/69 Accompanied By: son Schedule Follow-up Appointment: Yes Clinical Summary of Care: Electronic Signature(s) Signed: 07/24/2022 5:12:01 PM By: Betha Loa Entered By: Betha Loa on 07/24/2022 16:55:13 -------------------------------------------------------------------------------- Lower Extremity Assessment Details Patient Name: Date of Service: Kathryn [name] NNA L. 07/24/2022 3:45 PM Medical Record Number: [id number] Patient Account Number: [id number] Date of Birth/Sex: Treating RN: 05-Nov-1933 (89 y.o. Freddy Finner Primary Care Reynold Mantell: Ronnald Ramp Other Clinician: Betha Loa Referring  Tony Granquist: Treating Shatisha Falter/Extender: [name], [name] in Treatment: 11 Edema Assessment H[Left: RACHAEL, FERRIE ([id number])] Franne Forts: [id number] Page 3 of 7] Assessed: [Left: No] [Right: Yes] Edema: [Left: Ye] [Right: s] Calf Left: Right: Point of Measurement: 32 cm From Medial Instep 34.5 cm Ankle Left: Right: Point of Measurement: 10 cm From Medial Instep 22 cm Vascular Assessment Pulses: Dorsalis Pedis Palpable: [Right:Yes] Electronic Signature(s) Signed: 07/24/2022 5:12:01 PM By: Betha Loa Signed: 07/25/2022 9:40:32 AM By: Yevonne Pax RN Entered By: Betha Loa on 07/24/2022 16:30:39 -------------------------------------------------------------------------------- Multi Wound Chart Details Patient Name: Date of Service: Kathryn [name], DEA NNA L. 07/24/2022 3:45 PM Medical Record Number: [id number] Patient Account Number: [id number] Date of Birth/Sex: Treating RN: [birth year] (89 y.o. Freddy Finner Primary Care Homer Miller: Ronnald Ramp Other Clinician: Betha Loa Referring Ruhi Kopke: Treating Sundeep Destin/Extender: [name], [name] in Treatment: 11 Vital Signs Height(in): 64 Pulse(bpm): 78 Weight(lbs): 180 Blood Pressure(mmHg): 132/69 Body Mass Index(BMI): 30.9 Temperature(F): 97.9 Respiratory Rate(breaths/min): 18 [9:Photos:] [N/A:N/A] Right T Second oe N/A N/A Wound Location: Gradually Appeared N/A N/A Wounding Event: Diabetic Wound/Ulcer of the Lower N/A N/A Primary Etiology: Extremity Chronic Obstructive Pulmonary N/A N/A Comorbid History: Disease (COPD), Congestive Heart Failure, Coronary Artery Disease, Hypertension, Peripheral Arterial Disease, Peripheral Venous Disease, Type II Diabetes, Rheumatoid Arthritis, Neuropathy 01/24/2022 N/A N/A Date Acquired: 11 N/A N/A Weeks of Treatment: Open N/A N/A Wound Status: No N/A N/A Wound Recurrence: 0.5x0.8x0.3  N/A N/A Measurements L x W x D (cm) 0.314 N/A N/A A (cm) : rea 0.094 N/A N/A Volume (cm) : -149.20% N/A N/A % Reduction in A rea: -147.40% N/A N/A % Reduction in Volume: Grade 2 N/A N/A Classification: Medium N/A N/A Exudate A mount: Serosanguineous N/A N/A Exudate TypePALESTINE, MOSCO L ([id number]) [id number] Page 4 of 7 red, brown N/A N/A Exudate Color: None Present (0%) N/A N/A Granulation Amount: Large (67-100%) N/A N/A Necrotic Amount: Fat Layer (Subcutaneous Tissue): Yes N/A N/A Exposed Structures: Fascia: No Tendon: No Muscle: No Joint: No Bone: No None N/A N/A Epithelialization: Treatment Notes Electronic Signature(s) Signed: 07/24/2022 5:12:01 PM By: Betha Loa Entered By: Betha Loa on 07/24/2022 16:30:48 -------------------------------------------------------------------------------- Multi-Disciplinary Care Plan Details Patient Name: Date of Service: Kathryn [name], DEA NNA L. 07/24/2022 3:45 PM Medical  Record Number: [id number] Patient Account Number: [id number] Date of Birth/Sex: Treating RN: June 24, 1933 (88 y.o. Freddy Finner Primary Care Blaize Nipper: Ronnald Ramp Other Clinician: Betha Loa Referring Pari Lombard: Treating Dalesha Stanback/Extender: [name], [name] in Treatment: 11 Active Inactive Wound/Skin Impairment Nursing Diagnoses: Knowledge deficit related to ulceration/compromised skin integrity Goals: Patient/caregiver will verbalize understanding of skin care regimen Date Initiated: 05/07/2022 Date Inactivated: 05/14/2022 Target Resolution Date: 06/05/2022 Goal Status: Met Ulcer/skin breakdown will have a volume reduction of 30% by week 4 Date Initiated: 05/07/2022 Date Inactivated: 07/09/2022 Target Resolution Date: 06/05/2022 Goal Status: Met Ulcer/skin breakdown will have a volume reduction of 50% by week 8 Date Initiated: 05/07/2022 Date Inactivated: 07/09/2022 Target  Resolution Date: 07/06/2022 Goal Status: Met Ulcer/skin breakdown will have a volume reduction of 80% by week 12 Date Initiated: 05/07/2022 Target Resolution Date: 08/05/2022 Goal Status: Active Ulcer/skin breakdown will heal within 14 weeks Date Initiated: 05/07/2022 Target Resolution Date: 09/05/2022 Goal Status: Active Interventions: Assess patient/caregiver ability to obtain necessary supplies Assess patient/caregiver ability to perform ulcer/skin care regimen upon admission and as needed Assess ulceration(s) every visit Notes: Electronic Signature(s) Signed: 07/24/2022 5:12:01 PM By: Betha Loa Signed: 07/25/2022 9:40:32 AM By: Yevonne Pax RN Entered By: Betha Loa on 07/24/2022 16:46:46 -------------------------------------------------------------------------------- Pain Assessment Details Patient Name: Date of Service: Kathryn [name], DEA NNA L. 07/24/2022 3:45 PM Medical Record Number: [id number] Patient Account Number: [id number] Date of Birth/Sex: Treating RN: 1933/12/02 (89 y.o. Freddy Finner Virgil, [place] L ([id number]) [id number] Page 5 of 7 Primary Care Timoteo Carreiro: Ronnald Ramp Other Clinician: Betha Loa Referring Jahid Weida: Treating Samaia Iwata/Extender: [name], [name] in Treatment: 11 Active Problems Location of Pain Severity and Description of Pain Patient Has Paino Yes Site Locations Pain Location: Pain in Ulcers Duration of the Pain. Constant / Intermittento Intermittent Rate the pain. Current Pain Level: 5 Character of Pain Describe the Pain: Sharp Pain Management and Medication Current Pain Management: Medication: No Cold Application: No Rest: No Massage: No Activity: No T.E.N.S.: No Heat Application: No Leg drop or elevation: No Is the Current Pain Management Adequate: Inadequate How does your wound impact your activities of daily livingo Sleep: No Bathing: No Appetite:  No Relationship With Others: No Bladder Continence: No Emotions: No Bowel Continence: No Work: No Toileting: No Drive: No Dressing: No Hobbies: No Electronic Signature(s) Signed: 07/24/2022 5:12:01 PM By: Betha Loa Signed: 07/25/2022 9:40:32 AM By: Yevonne Pax RN Entered By: Betha Loa on 07/24/2022 16:23:02 -------------------------------------------------------------------------------- Patient/Caregiver Education Details Patient Name: Date of Service: Kathryn [name], DEA NNA Kathryn [name] 4/30/2024andnbsp3:45 PM Medical Record Number: [id number] Patient Account Number: [id number] Date of Birth/Gender: Treating RN: June 04, 1933 (89 y.o. Freddy Finner Primary Care Physician: Ronnald Ramp Other Clinician: Betha Loa Referring Physician: Treating Physician/Extender: [name] in Treatment: 11 Education Assessment Education Provided To: Patient Education Topics Provided Wound/Skin Impairment: Handouts: Other: continue wound care as directed Methods: Explain/Verbal SIENNAH, BARRASSO L ([id number]) [phone].pdf Page 6 of 7 Responses: State content correctly Electronic Signature(s) Signed: 07/24/2022 5:12:01 PM By: Betha Loa Entered By: Betha Loa on 07/24/2022 16:47:03 -------------------------------------------------------------------------------- Wound Assessment Details Patient Name: Date of Service: Kathryn [name], DEA NNA L. 07/24/2022 3:45 PM Medical Record Number: [id number] Patient Account Number: [id number] Date of Birth/Sex: Treating RN: 03-30-1933 (89 y.o. Freddy Finner Primary Care Arshi Duarte: Ronnald Ramp Other Clinician: Betha Loa Referring Avishai Reihl: Treating Areesha Dehaven/Extender: [name], [name] in Treatment: 11 Wound Status Wound Number: 9 Primary Diabetic Wound/Ulcer of the Lower Extremity Etiology: Wound Location: Right T  Second oe Wound  Open Wounding Event: Gradually Appeared Status: Date Acquired: 01/24/2022 Comorbid Chronic Obstructive Pulmonary Disease (COPD), Congestive Heart Weeks Of Treatment: 11 History: Failure, Coronary Artery Disease, Hypertension, Peripheral Arterial Clustered Wound: No Disease, Peripheral Venous Disease, Type II Diabetes, Rheumatoid Arthritis, Neuropathy Photos Wound Measurements Length: (cm) 0.5 Width: (cm) 0.8 Depth: (cm) 0.3 Area: (cm) 0.314 Volume: (cm) 0.094 % Reduction in Area: -149.2% % Reduction in Volume: -147.4% Epithelialization: None Wound Description Classification: Grade 2 Exudate Amount: Medium Exudate Type: Serosanguineous Exudate Color: red, brown Foul Odor After Cleansing: No Slough/Fibrino Yes Wound Bed Granulation Amount: None Present (0%) Exposed Structure Necrotic Amount: Large (67-100%) Fascia Exposed: No Fat Layer (Subcutaneous Tissue) Exposed: Yes Tendon Exposed: No Muscle Exposed: No Joint Exposed: No Bone Exposed: No Treatment Notes Wound #9 (Toe Second) Wound Laterality: Right Cleanser Soap and Water Discharge Instruction: Gently cleanse wound with antibacterial soap, rinse and pat dry prior to dressing wounds Batchelder, Aariyah L ([id number]) [id number] Page 7 of 7 Peri-Wound Care Topical Primary Dressing Prisma 4.34 (in) Discharge Instruction: Moisten w/normal saline or sterile water; Cover wound as directed. Do not remove from wound bed. Secondary Dressing Coverlet Latex-Free Fabric Adhesive Dressings Discharge Instruction: 1.5 x 2 Secured With Compression Wrap Compression Stockings Add-Ons Electronic Signature(s) Signed: 07/24/2022 5:12:01 PM By: Betha Loa Signed: 07/25/2022 9:40:32 AM By: Yevonne Pax RN Entered By: Betha Loa on 07/24/2022 16:29:45 -------------------------------------------------------------------------------- Vitals Details Patient Name: Date of Service: Kathryn [name], DEA NNA L.  07/24/2022 3:45 PM Medical Record Number: [id number] Patient Account Number: [id number] Date of Birth/Sex: Treating RN: [birth year] (89 y.o. Freddy Finner Primary Care Sonia Bromell: Ronnald Ramp Other Clinician: Betha Loa Referring Rachard Isidro: Treating Elysabeth Aust/Extender: [name], [name] in Treatment: 11 Vital Signs Time Taken: 16:21 Temperature (F): 97.9 Height (in): 64 Pulse (bpm): 78 Weight (lbs): 180 Respiratory Rate (breaths/min): 18 Body Mass Index (BMI): 30.9 Blood Pressure (mmHg): 132/69 Reference Range: 80 - 120 mg / dl Electronic Signature(s) Signed: 07/24/2022 5:12:01 PM By: Betha Loa Entered By: Betha Loa on 07/24/2022 16:22:56

## 2022-07-30 ENCOUNTER — Ambulatory Visit: Payer: Commercial Indemnity | Admitting: Family Medicine

## 2022-07-31 ENCOUNTER — Encounter: Payer: Medicare HMO | Attending: Physician Assistant | Admitting: Physician Assistant

## 2022-07-31 DIAGNOSIS — L97512 Non-pressure chronic ulcer of other part of right foot with fat layer exposed: Secondary | ICD-10-CM | POA: Insufficient documentation

## 2022-07-31 DIAGNOSIS — I5042 Chronic combined systolic (congestive) and diastolic (congestive) heart failure: Secondary | ICD-10-CM | POA: Diagnosis not present

## 2022-07-31 DIAGNOSIS — E1151 Type 2 diabetes mellitus with diabetic peripheral angiopathy without gangrene: Secondary | ICD-10-CM | POA: Diagnosis not present

## 2022-07-31 DIAGNOSIS — M6281 Muscle weakness (generalized): Secondary | ICD-10-CM | POA: Insufficient documentation

## 2022-07-31 DIAGNOSIS — E11621 Type 2 diabetes mellitus with foot ulcer: Secondary | ICD-10-CM | POA: Insufficient documentation

## 2022-07-31 DIAGNOSIS — I110 Hypertensive heart disease with heart failure: Secondary | ICD-10-CM | POA: Insufficient documentation

## 2022-08-01 NOTE — Progress Notes (Signed)
HARLEQUINN, KOVALSKI ([id number]) [id number] Page 1 of 7 Visit Report for 07/31/2022 Chief Complaint Document Details Patient Name: Date of Service: Kathryn [name] NNA L. 07/31/2022 3:45 PM Medical Record Number: [id number] Patient Account Number: [id number] Date of Birth/Sex: Treating RN: 04-Feb-1934 (89 y.o. [name] Primary Care Provider: Ronnald Ramp Other Clinician: Betha Loa Referring Provider: Treating Provider/Extender: Patric Dykes Weeks in Treatment: 12 Information Obtained from: Patient Chief Complaint Right second toe ulcer Electronic Signature(s) Signed: 07/31/2022 3:39:11 PM By: Allen Derry PA-C Entered By: Allen Derry on 07/31/2022 15:39:10 -------------------------------------------------------------------------------- HPI Details Patient Name: Date of Service: Kathryn [name], DEA NNA L. 07/31/2022 3:45 PM Medical Record Number: [id number] Patient Account Number: [id number] Date of Birth/Sex: Treating RN: 1933/09/13 (89 y.o. [name] Primary Care Provider: Ronnald Ramp Other Clinician: Betha Loa Referring Provider: Treating Provider/Extender: Patric Dykes Weeks in Treatment: 12 History of Present Illness HPI Description: 02-06-2022 upon evaluation today patient presents for initial inspection here in our clinic concerning wounds that she has over the bilateral feet as well as the right leg. She has been tolerating the dressing changes in general here without complication she has been putting antibiotic ointment on this. With that being said I do believe that she would benefit from the use of something like Xeroform gauze dressings which would likely be a bit better. Fortunately I do not see any signs of infection which is good news do not think she needs any antibiotics. She tells me that these wounds typically occur as a result of her bumping or hitting  her leg and then they slowly progressed from there. Patient does have a history of diabetes for which her hemoglobin A1c was 6.9 measured on 12-12-2021. She also has a history of arterial insufficiency with a right ABI of 0.66 with a TBI of 0.61 and a left ABI of 0.82 with a TBI of 0.45. She has a follow-up evaluation on 02-27-2022 with vascular. This testing was done on 01-28-2022. Subsequently the patient also has a history of hypertension, generalized weakness, congestive heart failure, and lower extremity edema. 02-13-2022 upon evaluation today patient appears to be doing well currently in regard to her wounds. She is actually making some good progress here which is great news and overall I am extremely pleased with where we stand. I do not see any evidence of worsening. 02-22-2022 upon evaluation today patient's wounds actually are showing some signs of improvement for the most part. Fortunately there does not appear to be any evidence of infection locally nor systemically at this point which is great news. No fevers, chills, nausea, vomiting, or diarrhea. She does have an a vascular appointment on December 5 and we will see what they have to say at that point. 03-08-2022 upon evaluation today patient actually appears to be doing excellent in regards to her wounds. She has been tolerating the dressing changes without complication. Fortunately there is no sign of active infection locally nor systemically at this point which is great news. Readmission: 05-07-2022 upon evaluation today patient presents for reevaluation here in the clinic it has been actually December 14 since I have last seen her. Subsequently she does still have a wound on the right second toe. This is the one area that has not healed everything on the left ended up healing. Fortunately I do not see any signs of infection at this point and really nothing is changed in her medical history since the last evaluation. She just tells me that  this wound is just being stubborn and does not want to heal. 05-14-2022 upon evaluation today patient appears to be doing poorly in regard to her toe ulcer. Unfortunately she is showing signs of this being a little worse than last week. I was hopeful that this would actually show some signs of improvement but were definitely not seeing that. I think we probably need to switch to a different dressing, recommend Iodosorb/Iodoflex as an option here for her. 05-21-2022 upon evaluation today patient appears to be doing well currently in regard to her toe ulcer. I did review her culture as well as her x-ray both are negative at this point. There is no growth after 3 days. With that being said that she has been exposed this actually is looking much better Is read as it was and overall much happier with where things stand currently. I do not see any signs of infection locally nor systemically at this point. 3/4; this is a patient with a punched out area on the dorsal aspect of her right second toe in the inner phalangeal joint area. She had a previous 1 that healed on the left second toe. She saw Dr. Gilda Crease at [facility]. He did not feel she required any surgical procedure suggested venous. We have been using Iodoflex 3/12; patient presents for follow-up. She has been using Iodoflex to the second right toe. She has no issues or complaints today. 06-12-2022 upon evaluation today patient appears to be doing well currently in regard to her wound. She actually seems to be making some progress the AUBREY, TEER ([id number]) [id number] Page 2 of 7 Iodoflex seems to be doing a pretty good job here. Fortunately I do not see any evidence of active infection locally nor systemically which is great news. No fevers, chills, nausea, vomiting, or diarrhea. 06-19-2022 upon evaluation today patient appears to be doing well currently in regard to her toe ulcer actually feel like this is  getting cleaner and I am very pleased in that regard. I do not see any signs of infection locally or systemically. 06-25-2022 upon evaluation today patient appears to be doing well currently in regard to her wound. She is going require little bit of sharp debridement but fortunately I am seeing some definite improvements here. I do not see any signs of infection locally nor systemically which is great news. No fevers, chills, nausea, vomiting, or diarrhea. 07-09-2022 upon evaluation today patient appears to be doing well currently in regard to her wound. She is actually showing signs of improvement I think the collagen is doing a good job. I do not see any signs of active infection locally nor systemically at this point. No fevers, chills, nausea, vomiting, or diarrhea. 07-16-2022 upon evaluation today patient appears to be doing poorly currently in regard to her toe. It does not look like she is having any signs of infection systemically but locally I do feel like there may be some inflammation at least in the question is is there is some infection there is definitely bone still exposed in the base of the wound not even sure there is not more of this and covering at this point. Fortunately I do not see that the redness seems to be spreading but the toe does seem a little bit more swollen. I think I am going to place her on antibiotics today which I discussed with her as well. Also believe that we probably need to consider an MRI to see where things stand. 07-24-2022  upon evaluation today patient appears to be doing okay currently in regard to her wound. She has been tolerating the dressing changes without complication. Fortunately there does not appear to be any signs of active infection locally nor systemically which is great news. No fevers, chills, nausea, vomiting, or diarrhea. 07-31-2022 upon evaluation today patient appears to be doing well currently in regard to her wound. She has been tolerating the  dressing changes without complication. She still has evidence of infection in the bone and specifically we discussed the fact that she appears to have osteomyelitis of the second toe. The goal is to try to prevent amputation here that is really what she would prefer. I completely understand and that is what we are going to focus on at this point. I do believe that she is going require additional antibiotic therapy I discussed that with her today. Electronic Signature(s) Signed: 07/31/2022 5:05:08 PM By: Allen Derry PA-C Entered By: Allen Derry on 07/31/2022 17:05:08 -------------------------------------------------------------------------------- Physical Exam Details Patient Name: Date of Service: Kathryn [name], DEA NNA L. 07/31/2022 3:45 PM Medical Record Number: [id number] Patient Account Number: [id number] Date of Birth/Sex: Treating RN: 14-Jul-1933 (89 y.o. [name] Primary Care Provider: Ronnald Ramp Other Clinician: Betha Loa Referring Provider: Treating Provider/Extender: [name], [name] in Treatment: 12 Constitutional Well-nourished and well-hydrated in no acute distress. Respiratory normal breathing without difficulty. Psychiatric this patient is able to make decisions and demonstrates good insight into disease process. Alert and Oriented x 3. pleasant and cooperative. Notes Upon inspection patient's wound bed actually showed signs of slough buildup although was able to clean this away just with saline and gauze. Fortunately I do not think that the redness is spreading into the foot but unfortunately this toe is still red and swollen I think we probably need to increase the antibiotic regimen I am going to recommend adding Cipro and Flagyl to the doxycycline. Electronic Signature(s) Signed: 07/31/2022 5:06:56 PM By: Allen Derry PA-C Entered By: Allen Derry on 07/31/2022  17:06:55 -------------------------------------------------------------------------------- Physician Orders Details Patient Name: Date of Service: Kathryn [name], DEA NNA L. 07/31/2022 3:45 PM Medical Record Number: [id number] Patient Account Number: [id number] Date of Birth/Sex: Treating RN: May 23, 1933 (89 y.o. [name] Primary Care Provider: Ronnald Ramp Other Clinician: Betha Loa Referring Provider: Treating Provider/Extender: [name] in Treatment: 12 Verbal / Phone Orders: Yes Clinician: Angelina Pih Read Back and Verified: Yes Diagnosis Coding ICD-10 Coding BUNIA, AMIRIAN ([id number]) [id number] Page 3 of 7 Code Description E11.621 Type 2 diabetes mellitus with foot ulcer I73.89 Other specified peripheral vascular diseases L97.512 Non-pressure chronic ulcer of other part of right foot with fat layer exposed M62.81 Muscle weakness (generalized) I50.42 Chronic combined systolic (congestive) and diastolic (congestive) heart failure I10 Essential (primary) hypertension Follow-up Appointments Return Appointment in 1 week. Bathing/ Shower/ Hygiene May shower; gently cleanse wound with antibacterial soap, rinse and pat dry prior to dressing wounds Anesthetic (Use 'Patient Medications' Section for Anesthetic Order Entry) Lidocaine applied to wound bed Edema Control - Lymphedema / Segmental Compressive Device / Other Patient to wear own compression stockings. Remove compression stockings every night before going to bed and put on every morning when getting up. Elevate, Exercise Daily and A void Standing for Long Periods of Time. Elevate legs to the level of the heart and pump ankles as often as possible Elevate leg(s) parallel to the floor when sitting. DO YOUR BEST to sleep in the bed at night. DO NOT sleep in your  recliner. Long hours of sitting in a recliner leads to swelling of the legs and/or  potential wounds on your backside. Medications-Please add to medication list. ntibiotics - continue Doxycycline P.O. A start Cipro and Flagyl Wound Treatment Wound #9 - T Second oe Wound Laterality: Right Cleanser: Soap and Water 3 x Per Week/30 Days Discharge Instructions: Gently cleanse wound with antibacterial soap, rinse and pat dry prior to dressing wounds Prim Dressing: Prisma 4.34 (in) 3 x Per Week/30 Days ary Discharge Instructions: Moisten w/normal saline or sterile water; Cover wound as directed. Do not remove from wound bed. Secondary Dressing: Coverlet Latex-Free Fabric Adhesive Dressings 3 x Per Week/30 Days Discharge Instructions: 1.5 x 2 Patient Medications llergies: oxycodone, prednisone A Notifications Medication Indication Start End 07/31/2022 metronidazole DOSE 1 - oral 500 mg tablet - 1 tablet oral twice a day x 30 days Electronic Signature(s) Signed: 07/31/2022 5:10:47 PM By: Allen Derry PA-C Entered By: Allen Derry on 07/31/2022 17:10:47 -------------------------------------------------------------------------------- Problem List Details Patient Name: Date of Service: Kathryn [name], DEA NNA L. 07/31/2022 3:45 PM Medical Record Number: [id number] Patient Account Number: [id number] Date of Birth/Sex: Treating RN: August 26, 1933 (89 y.o. [name] Primary Care Provider: Ronnald Ramp Other Clinician: Betha Loa Referring Provider: Treating Provider/Extender: Patric Dykes Weeks in Treatment: 12 Active Problems ICD-10 Encounter Code Description Active Date MDM Diagnosis E11.621 Type 2 diabetes mellitus with foot ulcer 05/07/2022 No Yes AANSHI, HORSEMAN L ([id number]) [id number] Page 4 of 7 I73.89 Other specified peripheral vascular diseases 05/07/2022 No Yes L97.512 Non-pressure chronic ulcer of other part of right foot with fat layer exposed 05/07/2022 No Yes M62.81 Muscle weakness (generalized)  05/07/2022 No Yes I50.42 Chronic combined systolic (congestive) and diastolic (congestive) heart failure 05/07/2022 No Yes I10 Essential (primary) hypertension 05/07/2022 No Yes Inactive Problems Resolved Problems Electronic Signature(s) Signed: 07/31/2022 3:39:03 PM By: Allen Derry PA-C Entered By: Allen Derry on 07/31/2022 15:39:03 -------------------------------------------------------------------------------- Progress Note Details Patient Name: Date of Service: Kathryn [name], DEA NNA L. 07/31/2022 3:45 PM Medical Record Number: [id number] Patient Account Number: [id number] Date of Birth/Sex: Treating RN: [birth year] (89 y.o. [name] Primary Care Provider: Ronnald Ramp Other Clinician: Betha Loa Referring Provider: Treating Provider/Extender: Patric Dykes Weeks in Treatment: 12 Subjective Chief Complaint Information obtained from Patient Right second toe ulcer History of Present Illness (HPI) 02-06-2022 upon evaluation today patient presents for initial inspection here in our clinic concerning wounds that she has over the bilateral feet as well as the right leg. She has been tolerating the dressing changes in general here without complication she has been putting antibiotic ointment on this. With that being said I do believe that she would benefit from the use of something like Xeroform gauze dressings which would likely be a bit better. Fortunately I do not see any signs of infection which is good news do not think she needs any antibiotics. She tells me that these wounds typically occur as a result of her bumping or hitting her leg and then they slowly progressed from there. Patient does have a history of diabetes for which her hemoglobin A1c was 6.9 measured on 12-12-2021. She also has a history of arterial insufficiency with a right ABI of 0.66 with a TBI of 0.61 and a left ABI of 0.82 with a TBI of 0.45. She has a follow-up evaluation on  02-27-2022 with vascular. This testing was done on 01-28-2022. Subsequently the patient also has a history of hypertension, generalized weakness, congestive heart failure, and lower extremity  edema. 02-13-2022 upon evaluation today patient appears to be doing well currently in regard to her wounds. She is actually making some good progress here which is great news and overall I am extremely pleased with where we stand. I do not see any evidence of worsening. 02-22-2022 upon evaluation today patient's wounds actually are showing some signs of improvement for the most part. Fortunately there does not appear to be any evidence of infection locally nor systemically at this point which is great news. No fevers, chills, nausea, vomiting, or diarrhea. She does have an a vascular appointment on December 5 and we will see what they have to say at that point. 03-08-2022 upon evaluation today patient actually appears to be doing excellent in regards to her wounds. She has been tolerating the dressing changes without complication. Fortunately there is no sign of active infection locally nor systemically at this point which is great news. Readmission: 05-07-2022 upon evaluation today patient presents for reevaluation here in the clinic it has been actually December 14 since I have last seen her. Subsequently she does still have a wound on the right second toe. This is the one area that has not healed everything on the left ended up healing. Fortunately I do not see any signs of infection at this point and really nothing is changed in her medical history since the last evaluation. She just tells me that this wound is just being stubborn and does not want to heal. 05-14-2022 upon evaluation today patient appears to be doing poorly in regard to her toe ulcer. Unfortunately she is showing signs of this being a little worse than last week. I was hopeful that this would actually show some signs of improvement but were  definitely not seeing that. I think we probably need to switch ZARIEL, CROAN ([id number]) [id number] Page 5 of 7 to a different dressing, recommend Iodosorb/Iodoflex as an option here for her. 05-21-2022 upon evaluation today patient appears to be doing well currently in regard to her toe ulcer. I did review her culture as well as her x-ray both are negative at this point. There is no growth after 3 days. With that being said that she has been exposed this actually is looking much better Is read as it was and overall much happier with where things stand currently. I do not see any signs of infection locally nor systemically at this point. 3/4; this is a patient with a punched out area on the dorsal aspect of her right second toe in the inner phalangeal joint area. She had a previous 1 that healed on the left second toe. She saw Dr. Gilda Crease at [facility]. He did not feel she required any surgical procedure suggested venous. We have been using Iodoflex 3/12; patient presents for follow-up. She has been using Iodoflex to the second right toe. She has no issues or complaints today. 06-12-2022 upon evaluation today patient appears to be doing well currently in regard to her wound. She actually seems to be making some progress the Iodoflex seems to be doing a pretty good job here. Fortunately I do not see any evidence of active infection locally nor systemically which is great news. No fevers, chills, nausea, vomiting, or diarrhea. 06-19-2022 upon evaluation today patient appears to be doing well currently in regard to her toe ulcer actually feel like this is getting cleaner and I am very pleased in that regard. I do not see any signs of infection locally or systemically. 06-25-2022 upon evaluation  today patient appears to be doing well currently in regard to her wound. She is going require little bit of sharp debridement but fortunately I am seeing some definite  improvements here. I do not see any signs of infection locally nor systemically which is great news. No fevers, chills, nausea, vomiting, or diarrhea. 07-09-2022 upon evaluation today patient appears to be doing well currently in regard to her wound. She is actually showing signs of improvement I think the collagen is doing a good job. I do not see any signs of active infection locally nor systemically at this point. No fevers, chills, nausea, vomiting, or diarrhea. 07-16-2022 upon evaluation today patient appears to be doing poorly currently in regard to her toe. It does not look like she is having any signs of infection systemically but locally I do feel like there may be some inflammation at least in the question is is there is some infection there is definitely bone still exposed in the base of the wound not even sure there is not more of this and covering at this point. Fortunately I do not see that the redness seems to be spreading but the toe does seem a little bit more swollen. I think I am going to place her on antibiotics today which I discussed with her as well. Also believe that we probably need to consider an MRI to see where things stand. 07-24-2022 upon evaluation today patient appears to be doing okay currently in regard to her wound. She has been tolerating the dressing changes without complication. Fortunately there does not appear to be any signs of active infection locally nor systemically which is great news. No fevers, chills, nausea, vomiting, or diarrhea. 07-31-2022 upon evaluation today patient appears to be doing well currently in regard to her wound. She has been tolerating the dressing changes without complication. She still has evidence of infection in the bone and specifically we discussed the fact that she appears to have osteomyelitis of the second toe. The goal is to try to prevent amputation here that is really what she would prefer. I completely understand and that is what  we are going to focus on at this point. I do believe that she is going require additional antibiotic therapy I discussed that with her today. Objective Constitutional Well-nourished and well-hydrated in no acute distress. Vitals Time Taken: 3:40 PM, Height: 64 in, Weight: 180 lbs, BMI: 30.9, Temperature: 98.0 F, Pulse: 78 bpm, Respiratory Rate: 18 breaths/min, Blood Pressure: 124/72 mmHg. Respiratory normal breathing without difficulty. Psychiatric this patient is able to make decisions and demonstrates good insight into disease process. Alert and Oriented x 3. pleasant and cooperative. General Notes: Upon inspection patient's wound bed actually showed signs of slough buildup although was able to clean this away just with saline and gauze. Fortunately I do not think that the redness is spreading into the foot but unfortunately this toe is still red and swollen I think we probably need to increase the antibiotic regimen I am going to recommend adding Cipro and Flagyl to the doxycycline. Integumentary (Hair, Skin) Wound #9 status is Open. Original cause of wound was Gradually Appeared. The date acquired was: 01/24/2022. The wound has been in treatment 12 weeks. The wound is located on the Right T Second. The wound measures 0.5cm length x 0.7cm width x 0.2cm depth; 0.275cm^2 area and 0.055cm^3 volume. There is oe Fat Layer (Subcutaneous Tissue) exposed. There is a medium amount of serosanguineous drainage noted. There is no granulation within the  wound bed. There is a large (67-100%) amount of necrotic tissue within the wound bed. Assessment Active Problems ICD-10 Type 2 diabetes mellitus with foot ulcer Other specified peripheral vascular diseases Non-pressure chronic ulcer of other part of right foot with fat layer exposed Muscle weakness (generalized) Chronic combined systolic (congestive) and diastolic (congestive) heart failure Essential (primary) hypertension Dawes, Airis L  ([id number]) [id number] Page 6 of 7 Plan Follow-up Appointments: Return Appointment in 1 week. Bathing/ Shower/ Hygiene: May shower; gently cleanse wound with antibacterial soap, rinse and pat dry prior to dressing wounds Anesthetic (Use 'Patient Medications' Section for Anesthetic Order Entry): Lidocaine applied to wound bed Edema Control - Lymphedema / Segmental Compressive Device / Other: Patient to wear own compression stockings. Remove compression stockings every night before going to bed and put on every morning when getting up. Elevate, Exercise Daily and Avoid Standing for Long Periods of Time. Elevate legs to the level of the heart and pump ankles as often as possible Elevate leg(s) parallel to the floor when sitting. DO YOUR BEST to sleep in the bed at night. DO NOT sleep in your recliner. Long hours of sitting in a recliner leads to swelling of the legs and/or potential wounds on your backside. Medications-Please add to medication list.: P.O. Antibiotics - continue Doxycycline start Cipro and Flagyl The following medication(s) was prescribed: metronidazole oral 500 mg tablet 1 1 tablet oral twice a day x 30 days starting 07/31/2022 WOUND #9: - T Second Wound Laterality: Right oe Cleanser: Soap and Water 3 x Per Week/30 Days Discharge Instructions: Gently cleanse wound with antibacterial soap, rinse and pat dry prior to dressing wounds Prim Dressing: Prisma 4.34 (in) 3 x Per Week/30 Days ary Discharge Instructions: Moisten w/normal saline or sterile water; Cover wound as directed. Do not remove from wound bed. Secondary Dressing: Coverlet Latex-Free Fabric Adhesive Dressings 3 x Per Week/30 Days Discharge Instructions: 1.5 x 2 1. I would recommend currently that we go ahead and continue with the antibiotics although I am going to add Cipro and Flagyl to the doxycycline I discussed that with her today and she is in agreement with that plan. 2. I am  going to suggest as well that we continue to monitor for any signs of infection or worsening. Also discussed the possibility of HBO therapy with the patient today as well I think that is still something to keep in mind. And in fact I think it may be something that we would need to strongly consider although right now we will get her started on antibiotics and we will going to see how this does first before proceeding down that road. 3. I am going to also recommend that the patient should continue to change the dressing on a regular basis she continues with this currently and I think that still the ideal way to go and again she is going to be going on a trip I am okay with that as long she takes the medications whether I think she will be perfectly fine. We will see patient back for reevaluation in 1 week here in the clinic. If anything worsens or changes patient will contact our office for additional recommendations. Electronic Signature(s) Signed: 07/31/2022 5:11:21 PM By: Allen Derry PA-C Entered By: Allen Derry on 07/31/2022 17:11:21 -------------------------------------------------------------------------------- SuperBill Details Patient Name: Date of Service: Kathryn [name], DEA NNA L. 07/31/2022 Medical Record Number: [id number] Patient Account Number: [id number] Date of Birth/Sex: Treating RN: [birth year] (89 y.o. [name] Primary Care Provider: Ronnald Ramp  Other Clinician: Betha Loa Referring Provider: Treating Provider/Extender: Patric Dykes Weeks in Treatment: 12 Diagnosis Coding ICD-10 Codes Code Description E11.621 Type 2 diabetes mellitus with foot ulcer I73.89 Other specified peripheral vascular diseases L97.512 Non-pressure chronic ulcer of other part of right foot with fat layer exposed M62.81 Muscle weakness (generalized) I50.42 Chronic combined systolic (congestive) and diastolic (congestive) heart failure I10 Essential  (primary) hypertension Facility Procedures Physician Procedures : CPT4 Code Description Modifier [id number] - WC PHYS LEVEL 4 - EST PT ICD-10 Diagnosis Description E11.621 Type 2 diabetes mellitus with foot ulcer I73.89 Other specified peripheral vascular diseases L97.512 Non-pressure chronic ulcer of other part  of right foot with fat layer exposed M62.81 Muscle weakness (generalized) Quantity: 1 Electronic Signature(s) Signed: 07/31/2022 5:13:59 PM By: Allen Derry PA-C Previous Signature: 07/31/2022 5:13:46 PM Version By: Allen Derry PA-C Entered By: Allen Derry on 07/31/2022 17:13:59

## 2022-08-02 ENCOUNTER — Other Ambulatory Visit: Payer: Self-pay | Admitting: Cardiovascular Disease

## 2022-08-02 NOTE — Progress Notes (Signed)
DKAYLA, MCNINCH ([id number]) [id number] Page 1 of 8 Visit Report for 07/31/2022 Arrival Information Details Patient Name: Date of Service: Kathryn [name] NNA L. 07/31/2022 3:45 PM Medical Record Number: [id number] Patient Account Number: [id number] Date of Birth/Sex: Treating RN: 1933/06/28 (89 y.o. Kathryn [name] Primary Care Santos Sollenberger: Ronnald Ramp Other Clinician: Betha Loa Referring Oneta Sigman: Treating Raseel Jans/Extender: [name] in Treatment: 12 Visit Information History Since Last Visit All ordered tests and consults were completed: No Patient Arrived: Wheel Chair Added or deleted any medications: No Arrival Time: 15:38 Any new allergies or adverse reactions: No Transfer Assistance: EasyPivot Patient Lift Had a fall or experienced change in No Patient Identification Verified: Yes activities of daily living that may affect Secondary Verification Process Completed: Yes risk of falls: Patient Requires Transmission-Based Precautions: No Signs or symptoms of abuse/neglect since last visito No Patient Has Alerts: Yes Hospitalized since last visit: No Patient Alerts: ABI L .82 02/06/22 Implantable device outside of the clinic excluding No ABI R .66 02/06/22 cellular tissue based products placed in the center since last visit: Has Dressing in Place as Prescribed: Yes Pain Present Now: Yes Electronic Signature(s) Signed: 08/01/2022 4:35:12 PM By: Betha Loa Entered By: Betha Loa on 07/31/2022 15:39:02 -------------------------------------------------------------------------------- Clinic Level of Care Assessment Details Patient Name: Date of Service: Kathryn [name] NNA L. 07/31/2022 3:45 PM Medical Record Number: [id number] Patient Account Number: [id number] Date of Birth/Sex: Treating RN: [birth year] (89 y.o. Kathryn [name] Primary Care Chenelle Benning: Ronnald Ramp Other Clinician:  Betha Loa Referring Alby Schwabe: Treating Saamir Armstrong/Extender: [name] in Treatment: 12 Clinic Level of Care Assessment Items TOOL 4 Quantity Score []  - 0 Use when only an EandM is performed on FOLLOW-UP visit ASSESSMENTS - Nursing Assessment / Reassessment X- 1 10 Reassessment of Co-morbidities (includes updates in patient status) X- 1 5 Reassessment of Adherence to Treatment Plan ASSESSMENTS - Wound and Skin A ssessment / Reassessment X - Simple Wound Assessment / Reassessment - one wound 1 5 []  - 0 Complex Wound Assessment / Reassessment - multiple wounds []  - 0 Dermatologic / Skin Assessment (not related to wound area) ASSESSMENTS - Focused Assessment []  - 0 Circumferential Edema Measurements - multi extremities []  - 0 Nutritional Assessment / Counseling / Intervention []  - 0 Lower Extremity Assessment (monofilament, tuning fork, pulses) []  - 0 Peripheral Arterial Disease Assessment (using hand held doppler) ASSESSMENTS - Ostomy and/or Continence Assessment and Care []  - 0 Incontinence Assessment and Management []  - 0 Ostomy Care Assessment and Management (repouching, etc.) PROCESS - Coordination of Care LEANIE, WUETHRICH ([id number]) [id number] Page 2 of 8 X- 1 15 Simple Patient / Family Education for ongoing care []  - 0 Complex (extensive) Patient / Family Education for ongoing care []  - 0 Staff obtains Chiropractor, Records, T Results / Process Orders est []  - 0 Staff telephones HHA, Nursing Homes / Clarify orders / etc []  - 0 Routine Transfer to another Facility (non-emergent condition) []  - 0 Routine Hospital Admission (non-emergent condition) []  - 0 New Admissions / Manufacturing engineer / Ordering NPWT Apligraf, etc. , []  - 0 Emergency Hospital Admission (emergent condition) X- 1 10 Simple Discharge Coordination []  - 0 Complex (extensive) Discharge Coordination PROCESS - Special Needs []   - 0 Pediatric / Minor Patient Management []  - 0 Isolation Patient Management []  - 0 Hearing / Language / Visual special needs []  - 0 Assessment of Community assistance (transportation, D/C planning, etc.) []  - 0 Additional assistance / Altered mentation []  -  0 Support Surface(s) Assessment (bed, cushion, seat, etc.) INTERVENTIONS - Wound Cleansing / Measurement X - Simple Wound Cleansing - one wound 1 5 []  - 0 Complex Wound Cleansing - multiple wounds X- 1 5 Wound Imaging (photographs - any number of wounds) []  - 0 Wound Tracing (instead of photographs) X- 1 5 Simple Wound Measurement - one wound []  - 0 Complex Wound Measurement - multiple wounds INTERVENTIONS - Wound Dressings X - Small Wound Dressing one or multiple wounds 1 10 []  - 0 Medium Wound Dressing one or multiple wounds []  - 0 Large Wound Dressing one or multiple wounds []  - 0 Application of Medications - topical []  - 0 Application of Medications - injection INTERVENTIONS - Miscellaneous []  - 0 External ear exam []  - 0 Specimen Collection (cultures, biopsies, blood, body fluids, etc.) []  - 0 Specimen(s) / Culture(s) sent or taken to Lab for analysis []  - 0 Patient Transfer (multiple staff / Nurse, adult / Similar devices) []  - 0 Simple Staple / Suture removal (25 or less) []  - 0 Complex Staple / Suture removal (26 or more) []  - 0 Hypo / Hyperglycemic Management (close monitor of Blood Glucose) []  - 0 Ankle / Brachial Index (ABI) - do not check if billed separately X- 1 5 Vital Signs Has the patient been seen at the hospital within the last three years: Yes Total Score: 75 Level Of Care: New/Established - Level 2 Electronic Signature(s) Signed: 08/01/2022 4:35:12 PM By: Alvira Philips, Kerri Perches ([id number]) [id number] Page 3 of 8 Entered By: Betha Loa on 07/31/2022 16:21:38 -------------------------------------------------------------------------------- Encounter  Discharge Information Details Patient Name: Date of Service: [name] 07/31/2022 3:45 PM Medical Record Number: [id number] Patient Account Number: [id number] Date of Birth/Sex: Treating RN: 10-Jul-1933 (89 y.o. Kathryn [name] Primary Care Kathryn [name]: Ronnald Ramp Other Clinician: Betha Loa Referring Tilak Oakley: Treating Monterrio Gerst/Extender: [name] in Treatment: 12 Encounter Discharge Information Items Discharge Condition: Stable Ambulatory Status: Wheelchair Discharge Destination: Home Transportation: Private Auto Accompanied By: son Schedule Follow-up Appointment: Yes Clinical Summary of Care: Electronic Signature(s) Signed: 08/01/2022 4:35:12 PM By: Betha Loa Entered By: Betha Loa on 07/31/2022 16:35:52 -------------------------------------------------------------------------------- Lower Extremity Assessment Details Patient Name: Date of Service: [name], DEA NNA L. 07/31/2022 3:45 PM Medical Record Number: [id number] Patient Account Number: [id number] Date of Birth/Sex: Treating RN: 11-Apr-1933 (89 y.o. Kathryn [name] Primary Care Romeo Zielinski: Ronnald Ramp Other Clinician: Betha Loa Referring Dalyn Becker: Treating Williard Keller/Extender: [name], [name] in Treatment: 12 Edema Assessment Assessed: [Left: No] [Right: No] Edema: [Left: Ye] [Right: s] Calf Left: Right: Point of Measurement: 32 cm From Medial Instep 33.2 cm Ankle Left: Right: Point of Measurement: 10 cm From Medial Instep 22.5 cm Vascular Assessment Pulses: Dorsalis Pedis Palpable: [Right:Yes] Electronic Signature(s) Signed: 07/31/2022 4:02:31 PM By: Angelina Pih Signed: 08/01/2022 4:35:12 PM By: Betha Loa Entered By: Betha Loa on 07/31/2022 15:50:47 -------------------------------------------------------------------------------- Multi Wound Chart Details Patient Name: Date of  Service: [name], DEA NNA L. 07/31/2022 3:45 PM Medical Record Number: [id number] Patient Account Number: [id number] Date of Birth/Sex: Treating RN: 10-07-1933 (89 y.o. Kathryn [name] Primary Care Akita Maxim: Ronnald Ramp Other Clinician: Joshlin, Icard ([id number]) [id number] Page 4 of 8 Referring Georgette Helmer: Treating Jaceion Aday/Extender: [name], [name] in Treatment: 12 Vital Signs Height(in): 64 Pulse(bpm): 78 Weight(lbs): 180 Blood Pressure(mmHg): 124/72 Body Mass Index(BMI): 30.9 Temperature(F): 98.0 Respiratory Rate(breaths/min): 18 [9:Photos:] [N/A:N/A] Right T Second oe N/A N/A Wound Location: Gradually Appeared N/A N/A  Wounding Event: Diabetic Wound/Ulcer of the Lower N/A N/A Primary Etiology: Extremity Chronic Obstructive Pulmonary N/A N/A Comorbid History: Disease (COPD), Congestive Heart Failure, Coronary Artery Disease, Hypertension, Peripheral Arterial Disease, Peripheral Venous Disease, Type II Diabetes, Rheumatoid Arthritis, Neuropathy 01/24/2022 N/A N/A Date Acquired: 12 N/A N/A Weeks of Treatment: Open N/A N/A Wound Status: No N/A N/A Wound Recurrence: 0.5x0.7x0.2 N/A N/A Measurements L x W x D (cm) 0.275 N/A N/A A (cm) : rea 0.055 N/A N/A Volume (cm) : -118.30% N/A N/A % Reduction in A rea: -44.70% N/A N/A % Reduction in Volume: Grade 2 N/A N/A Classification: Medium N/A N/A Exudate A mount: Serosanguineous N/A N/A Exudate Type: red, brown N/A N/A Exudate Color: None Present (0%) N/A N/A Granulation A mount: Large (67-100%) N/A N/A Necrotic A mount: Fat Layer (Subcutaneous Tissue): Yes N/A N/A Exposed Structures: Fascia: No Tendon: No Muscle: No Joint: No Bone: No None N/A N/A Epithelialization: Treatment Notes Electronic Signature(s) Signed: 08/01/2022 4:35:12 PM By: Betha Loa Entered By: Betha Loa on 07/31/2022  15:50:57 -------------------------------------------------------------------------------- Multi-Disciplinary Care Plan Details Patient Name: Date of Service: [name], DEA NNA L. 07/31/2022 3:45 PM Medical Record Number: [id number] Patient Account Number: [id number] Date of Birth/Sex: Treating RN: January 23, 1934 (89 y.o. Kathryn [name] Primary Care Wood Novacek: Ronnald Ramp Other Clinician: Betha Loa Referring Melda Mermelstein: Treating Lynnzie Blackson/Extender: [name] in Treatment: [address] KARTER, HLAVATY L ([id number]) [id number] Page 5 of 8 Wound/Skin Impairment Nursing Diagnoses: Knowledge deficit related to ulceration/compromised skin integrity Goals: Patient/caregiver will verbalize understanding of skin care regimen Date Initiated: 05/07/2022 Date Inactivated: 05/14/2022 Target Resolution Date: 06/05/2022 Goal Status: Met Ulcer/skin breakdown will have a volume reduction of 30% by week 4 Date Initiated: 05/07/2022 Date Inactivated: 07/09/2022 Target Resolution Date: 06/05/2022 Goal Status: Met Ulcer/skin breakdown will have a volume reduction of 50% by week 8 Date Initiated: 05/07/2022 Date Inactivated: 07/09/2022 Target Resolution Date: 07/06/2022 Goal Status: Met Ulcer/skin breakdown will have a volume reduction of 80% by week 12 Date Initiated: 05/07/2022 Target Resolution Date: 08/05/2022 Goal Status: Active Ulcer/skin breakdown will heal within 14 weeks Date Initiated: 05/07/2022 Target Resolution Date: 09/05/2022 Goal Status: Active Interventions: Assess patient/caregiver ability to obtain necessary supplies Assess patient/caregiver ability to perform ulcer/skin care regimen upon admission and as needed Assess ulceration(s) every visit Notes: Electronic Signature(s) Signed: 08/01/2022 4:35:12 PM By: Betha Loa Signed: 08/02/2022 4:04:22 PM By: Angelina Pih Entered By: Betha Loa on 07/31/2022  16:21:52 -------------------------------------------------------------------------------- Pain Assessment Details Patient Name: Date of Service: [name], DEA NNA L. 07/31/2022 3:45 PM Medical Record Number: [id number] Patient Account Number: [id number] Date of Birth/Sex: Treating RN: 03-07-1934 (89 y.o. Kathryn [name] Primary Care Alvah Gilder: Ronnald Ramp Other Clinician: Betha Loa Referring Tou Hayner: Treating Lorelei Heikkila/Extender: Patric Dykes Weeks in Treatment: 12 Active Problems Location of Pain Severity and Description of Pain Patient Has Paino Yes Site Locations Pain Location: Generalized Pain, Pain in Ulcers Duration of the Pain. Constant / Intermittento Constant Rate the pain. Current Pain Level: 4 Character of Pain Describe the Pain: Burning, Other: stinging Pain Management and Medication Current Pain Management: Medication: No Cold Application: No Badders, Dakota L ([id number]) [id number] Page 6 of 8 Rest: Yes Massage: No Activity: No T.E.N.S.: No Heat Application: No Leg drop or elevation: No Is the Current Pain Management Adequate: Inadequate How does your wound impact your activities of daily livingo Sleep: No Bathing: No Appetite: No Relationship With Others: No Bladder Continence: No Emotions: No Bowel Continence: No Work: No Toileting: No Drive:  No Dressing: No Hobbies: No Electronic Signature(s) Signed: 07/31/2022 4:02:31 PM By: Angelina Pih Signed: 08/01/2022 4:35:12 PM By: Betha Loa Entered By: Betha Loa on 07/31/2022 15:42:48 -------------------------------------------------------------------------------- Patient/Caregiver Education Details Patient Name: Date of Service: [name], DEA NNA Elbert Ewings 5/7/2024andnbsp3:45 PM Medical Record Number: [id number] Patient Account Number: [id number] Date of Birth/Gender: Treating RN: 06/03/1933 (89 y.o. Kathryn [name] Primary Care  Physician: Ronnald Ramp Other Clinician: Betha Loa Referring Physician: Treating Physician/Extender: [name] in Treatment: 12 Education Assessment Education Provided To: Patient Education Topics Provided Hyperbaric Oxygenation: Handouts: Hyperbaric Oxygen Methods: Explain/Verbal Responses: State content correctly Infection: Handouts: Other: discussed osteomyelitis and treatment goals Methods: Explain/Verbal Responses: State content correctly Electronic Signature(s) Signed: 08/01/2022 4:35:12 PM By: Betha Loa Entered By: Betha Loa on 07/31/2022 16:22:44 -------------------------------------------------------------------------------- Wound Assessment Details Patient Name: Date of Service: [name], DEA NNA L. 07/31/2022 3:45 PM Medical Record Number: [id number] Patient Account Number: [id number] Date of Birth/Sex: Treating RN: 02-Dec-1933 (89 y.o. Kathryn [name] Primary Care Doha Boling: Ronnald Ramp Other Clinician: Betha Loa Referring Robson Trickey: Treating Rosezella Kronick/Extender: [name], [name] in Treatment: 12 Wound Status Wound Number: 9 Primary Diabetic Wound/Ulcer of the Lower Extremity Etiology: Wound Location: Right T Second oe Wound Open Wounding Event: Gradually Appeared Status: Date Acquired: 01/24/2022 Comorbid Chronic Obstructive Pulmonary Disease (COPD), Congestive Heart Weeks Of Treatment: 12 History: Failure, Coronary Artery Disease, Hypertension, Peripheral Arterial Clustered Wound: No Disease, Peripheral Venous Disease, Type II Diabetes, Rheumatoid Arthritis, Neuropathy Zuluaga, Milinda L ([id number]) [id number] Page 7 of 8 Photos Wound Measurements Length: (cm) 0.5 Width: (cm) 0.7 Depth: (cm) 0.2 Area: (cm) 0.275 Volume: (cm) 0.055 % Reduction in Area: -118.3% % Reduction in Volume: -44.7% Epithelialization: None Wound  Description Classification: Grade 2 Exudate Amount: Medium Exudate Type: Serosanguineous Exudate Color: red, brown Foul Odor After Cleansing: No Slough/Fibrino Yes Wound Bed Granulation Amount: None Present (0%) Exposed Structure Necrotic Amount: Large (67-100%) Fascia Exposed: No Fat Layer (Subcutaneous Tissue) Exposed: Yes Tendon Exposed: No Muscle Exposed: No Joint Exposed: No Bone Exposed: No Treatment Notes Wound #9 (Toe Second) Wound Laterality: Right Cleanser Soap and Water Discharge Instruction: Gently cleanse wound with antibacterial soap, rinse and pat dry prior to dressing wounds Peri-Wound Care Topical Primary Dressing Prisma 4.34 (in) Discharge Instruction: Moisten w/normal saline or sterile water; Cover wound as directed. Do not remove from wound bed. Secondary Dressing Coverlet Latex-Free Fabric Adhesive Dressings Discharge Instruction: 1.5 x 2 Secured With Compression Wrap Compression Stockings Add-Ons Electronic Signature(s) Signed: 07/31/2022 4:02:31 PM By: Angelina Pih Signed: 08/01/2022 4:35:12 PM By: Betha Loa Entered By: Betha Loa on 07/31/2022 15:49:10 Vitals Details -------------------------------------------------------------------------------- [name] ([id number]) [id number] Page 8 of 8 Patient Name: Date of Service: Kathryn [name] NNA L. 07/31/2022 3:45 PM Medical Record Number: [id number] Patient Account Number: [id number] Date of Birth/Sex: Treating RN: 1933-10-12 (89 y.o. Kathryn [name] Primary Care Sahej Schrieber: Ronnald Ramp Other Clinician: Betha Loa Referring Coriana Angello: Treating Bryony Kaman/Extender: [name], [name] in Treatment: 12 Vital Signs Time Taken: 15:40 Temperature (F): 98.0 Height (in): 64 Pulse (bpm): 78 Weight (lbs): 180 Respiratory Rate (breaths/min): 18 Body Mass Index (BMI): 30.9 Blood Pressure (mmHg): 124/72 Reference Range: 80  - 120 mg / dl Electronic Signature(s) Signed: 08/01/2022 4:35:12 PM By: Betha Loa Entered By: Betha Loa on 07/31/2022 15:42:39

## 2022-08-02 NOTE — Telephone Encounter (Signed)
Please contact pt for future appointment. Pt due for 12 month f/u 08/2022.

## 2022-08-03 NOTE — Telephone Encounter (Signed)
Left voice mail to schedule appt

## 2022-08-06 NOTE — Telephone Encounter (Signed)
Left voice mail to schedule 12 month follow up appointment.

## 2022-08-07 ENCOUNTER — Encounter: Payer: Self-pay | Admitting: Cardiovascular Disease

## 2022-08-07 ENCOUNTER — Ambulatory Visit: Payer: Medicare HMO | Admitting: Physician Assistant

## 2022-08-07 NOTE — Telephone Encounter (Signed)
"  Unable to reach" letter has been mailed to patient.

## 2022-08-07 NOTE — Telephone Encounter (Signed)
Left voice mail to schedule appt

## 2022-08-09 ENCOUNTER — Encounter: Payer: Medicare HMO | Admitting: Physician Assistant

## 2022-08-09 DIAGNOSIS — L97512 Non-pressure chronic ulcer of other part of right foot with fat layer exposed: Secondary | ICD-10-CM | POA: Diagnosis not present

## 2022-08-13 ENCOUNTER — Ambulatory Visit (INDEPENDENT_AMBULATORY_CARE_PROVIDER_SITE_OTHER): Payer: Medicare HMO

## 2022-08-13 ENCOUNTER — Other Ambulatory Visit: Payer: Self-pay | Admitting: Family Medicine

## 2022-08-13 ENCOUNTER — Other Ambulatory Visit: Payer: Self-pay | Admitting: Cardiovascular Disease

## 2022-08-13 VITALS — Ht 64.0 in | Wt 181.0 lb

## 2022-08-13 DIAGNOSIS — Z0000 Encounter for general adult medical examination without abnormal findings: Secondary | ICD-10-CM | POA: Diagnosis not present

## 2022-08-13 DIAGNOSIS — E119 Type 2 diabetes mellitus without complications: Secondary | ICD-10-CM

## 2022-08-13 NOTE — Patient Instructions (Signed)
Kathryn [name] , Thank you for taking time to come for your Medicare Wellness Visit. I appreciate your ongoing commitment to your health goals. Please review the following plan we discussed and let me know if I can assist you in the future.   These are the goals we discussed:  Goals      Eat more fruits and vegetables     Recommend to eat 1-2 cups of fruit a day.        This is a list of the screening recommended for you and due dates:  Health Maintenance  Topic Date Due   COVID-19 Vaccine (1) Never done   Zoster (Shingles) Vaccine (1 of 2) Never done   Complete foot exam   11/15/2017   Eye exam for diabetics  12/28/2021   Hemoglobin A1C  06/12/2022   Flu Shot  10/25/2022   DTaP/Tdap/Td vaccine (2 - Td or Tdap) 02/11/2023   Medicare Annual Wellness Visit  08/13/2023   Pneumonia Vaccine  Completed   DEXA scan (bone density measurement)  Completed   HPV Vaccine  Aged Out    Advanced directives: yes  Conditions/risks identified: moderate falls risk  Next appointment: Follow up in one year for your annual wellness visit 08/14/2023 @10 :45am telephone   Preventive Care 65 Years and Older, Female Preventive care refers to lifestyle choices and visits with your health care provider that can promote health and wellness. What does preventive care include? A yearly physical exam. This is also called an annual well check. Dental exams once or twice a year. Routine eye exams. Ask your health care provider how often you should have your eyes checked. Personal lifestyle choices, including: Daily care of your teeth and gums. Regular physical activity. Eating a healthy diet. Avoiding tobacco and drug use. Limiting alcohol use. Practicing safe sex. Taking low-dose aspirin every day. Taking vitamin and mineral supplements as recommended by your health care provider. What happens during an annual well check? The services and screenings done by your health care provider during your annual  well check will depend on your age, overall health, lifestyle risk factors, and family history of disease. Counseling  Your health care provider may ask you questions about your: Alcohol use. Tobacco use. Drug use. Emotional well-being. Home and relationship well-being. Sexual activity. Eating habits. History of falls. Memory and ability to understand (cognition). Work and work Astronomer. Reproductive health. Screening  You may have the following tests or measurements: Height, weight, and BMI. Blood pressure. Lipid and cholesterol levels. These may be checked every 5 years, or more frequently if you are over 60 years old. Skin check. Lung cancer screening. You may have this screening every year starting at age 5 if you have a 30-pack-year history of smoking and currently smoke or have quit within the past 15 years. Fecal occult blood test (FOBT) of the stool. You may have this test every year starting at age 1. Flexible sigmoidoscopy or colonoscopy. You may have a sigmoidoscopy every 5 years or a colonoscopy every 10 years starting at age 91. Hepatitis C blood test. Hepatitis B blood test. Sexually transmitted disease (STD) testing. Diabetes screening. This is done by checking your blood sugar (glucose) after you have not eaten for a while (fasting). You may have this done every 1-3 years. Bone density scan. This is done to screen for osteoporosis. You may have this done starting at age 80. Mammogram. This may be done every 1-2 years. Talk to your health care provider about how  often you should have regular mammograms. Talk with your health care provider about your test results, treatment options, and if necessary, the need for more tests. Vaccines  Your health care provider may recommend certain vaccines, such as: Influenza vaccine. This is recommended every year. Tetanus, diphtheria, and acellular pertussis (Tdap, Td) vaccine. You may need a Td booster every 10 years. Zoster  vaccine. You may need this after age 49. Pneumococcal 13-valent conjugate (PCV13) vaccine. One dose is recommended after age 35. Pneumococcal polysaccharide (PPSV23) vaccine. One dose is recommended after age 84. Talk to your health care provider about which screenings and vaccines you need and how often you need them. This information is not intended to replace advice given to you by your health care provider. Make sure you discuss any questions you have with your health care provider. Document Released: 04/08/2015 Document Revised: 11/30/2015 Document Reviewed: 01/11/2015 Elsevier Interactive Patient Education  2017 [employer].  Fall Prevention in the Home Falls can cause injuries. They can happen to people of all ages. There are many things you can do to make your home safe and to help prevent falls. What can I do on the outside of my home? Regularly fix the edges of walkways and driveways and fix any cracks. Remove anything that might make you trip as you walk through a door, such as a raised step or threshold. Trim any bushes or trees on the path to your home. Use bright outdoor lighting. Clear any walking paths of anything that might make someone trip, such as rocks or tools. Regularly check to see if handrails are loose or broken. Make sure that both sides of any steps have handrails. Any raised decks and porches should have guardrails on the edges. Have any leaves, snow, or ice cleared regularly. Use sand or salt on walking paths during winter. Clean up any spills in your garage right away. This includes oil or grease spills. What can I do in the bathroom? Use night lights. Install grab bars by the toilet and in the tub and shower. Do not use towel bars as grab bars. Use non-skid mats or decals in the tub or shower. If you need to sit down in the shower, use a plastic, non-slip stool. Keep the floor dry. Clean up any water that spills on the floor as soon as it happens. Remove  soap buildup in the tub or shower regularly. Attach bath mats securely with double-sided non-slip rug tape. Do not have throw rugs and other things on the floor that can make you trip. What can I do in the bedroom? Use night lights. Make sure that you have a light by your bed that is easy to reach. Do not use any sheets or blankets that are too big for your bed. They should not hang down onto the floor. Have a firm chair that has side arms. You can use this for support while you get dressed. Do not have throw rugs and other things on the floor that can make you trip. What can I do in the kitchen? Clean up any spills right away. Avoid walking on wet floors. Keep items that you use a lot in easy-to-reach places. If you need to reach something above you, use a strong step stool that has a grab bar. Keep electrical cords out of the way. Do not use floor polish or wax that makes floors slippery. If you must use wax, use non-skid floor wax. Do not have throw rugs and other things  on the floor that can make you trip. What can I do with my stairs? Do not leave any items on the stairs. Make sure that there are handrails on both sides of the stairs and use them. Fix handrails that are broken or loose. Make sure that handrails are as long as the stairways. Check any carpeting to make sure that it is firmly attached to the stairs. Fix any carpet that is loose or worn. Avoid having throw rugs at the top or bottom of the stairs. If you do have throw rugs, attach them to the floor with carpet tape. Make sure that you have a light switch at the top of the stairs and the bottom of the stairs. If you do not have them, ask someone to add them for you. What else can I do to help prevent falls? Wear shoes that: Do not have high heels. Have rubber bottoms. Are comfortable and fit you well. Are closed at the toe. Do not wear sandals. If you use a stepladder: Make sure that it is fully opened. Do not climb a  closed stepladder. Make sure that both sides of the stepladder are locked into place. Ask someone to hold it for you, if possible. Clearly mark and make sure that you can see: Any grab bars or handrails. First and last steps. Where the edge of each step is. Use tools that help you move around (mobility aids) if they are needed. These include: Canes. Walkers. Scooters. Crutches. Turn on the lights when you go into a dark area. Replace any light bulbs as soon as they burn out. Set up your furniture so you have a clear path. Avoid moving your furniture around. If any of your floors are uneven, fix them. If there are any pets around you, be aware of where they are. Review your medicines with your doctor. Some medicines can make you feel dizzy. This can increase your chance of falling. Ask your doctor what other things that you can do to help prevent falls. This information is not intended to replace advice given to you by your health care provider. Make sure you discuss any questions you have with your health care provider. Document Released: 01/06/2009 Document Revised: 08/18/2015 Document Reviewed: 04/16/2014 Elsevier Interactive Patient Education  2017 [employer].

## 2022-08-13 NOTE — Progress Notes (Signed)
I connected with  Arionna L Encalade on 08/13/22 by a audio enabled telemedicine application and verified that I am speaking with the correct person using two identifiers.  Patient Location: Home  Provider Location: Office/Clinic  I discussed the limitations of evaluation and management by telemedicine. The patient expressed understanding and agreed to proceed.  Subjective:   Kathryn [name] is a 89 y.o. female who presents for Medicare Annual (Subsequent) preventive examination.  Review of Systems     Cardiac Risk Factors include: advanced age (>84men, >23 women);diabetes mellitus;dyslipidemia;hypertension;obesity (BMI >30kg/m2);sedentary lifestyle     Objective:    Today's Vitals   08/13/22 1049 08/13/22 1050  Weight: 181 lb (82.1 kg)   Height: 5\' 4"  (1.626 m)   PainSc:  5    Body mass index is 31.07 kg/m.     08/13/2022   11:00 AM 03/22/2022    9:27 PM 03/13/2022   10:33 AM 09/02/2021   11:25 AM 12/23/2020    6:26 PM 12/23/2020    6:25 PM 06/06/2020    9:27 AM  Advanced Directives  Does Patient Have a Medical Advance Directive? Yes No No No  Yes Yes  Type of Advance Directive     Living will;Healthcare Power of Attorney  Living will  Does patient want to make changes to medical advance directive?      Yes (Inpatient - patient defers changing a medical advance directive and declines information at this time) No - Patient declined  Copy of Healthcare Power of Attorney in Chart?     No - copy requested    Would patient like information on creating a medical advance directive?  No - Patient declined No - Patient declined    No - Patient declined    Current Medications (verified) Outpatient Encounter Medications as of 08/13/2022  Medication Sig   acetaminophen (TYLENOL) 500 MG tablet Take 1,000 mg by mouth every 6 (six) hours as needed. Pain    albuterol (PROVENTIL) (2.5 MG/3ML) 0.083% nebulizer solution USE 1 VIAL VIA NEBULIZER EVERY 6 HOURS AS NEEDED FOR WHEEZING OR  SHORTNESS OF BREATH   amiodarone (PACERONE) 200 MG tablet Take 0.5 tablets (100 mg total) by mouth daily.   aspirin 81 MG EC tablet Take 81 mg by mouth daily. Swallow whole.   atorvastatin (LIPITOR) 40 MG tablet TAKE 1 TABLET(40 MG) BY MOUTH DAILY   carvedilol (COREG) 6.25 MG tablet TAKE 1 TABLET(6.25 MG) BY MOUTH TWICE DAILY WITH A MEAL   dapagliflozin propanediol (FARXIGA) 10 MG TABS tablet TAKE 1 TABLET(10 MG) BY MOUTH DAILY   doxycycline (VIBRAMYCIN) 100 MG capsule Take 100 mg by mouth 2 (two) times daily.   gabapentin (NEURONTIN) 400 MG capsule Take 1 capsule (400 mg total) by mouth 4 (four) times daily.   glucose blood (CONTOUR NEXT TEST) test strip Check sugar once daily  DX E11.9   hydrOXYzine (ATARAX) 10 MG tablet TAKE 1 TO 2 TABLETS(10 TO 20 MG) BY MOUTH EVERY 6 HOURS AS NEEDED   levothyroxine (SYNTHROID) 100 MCG tablet TAKE 1 TABLET(100 MCG) BY MOUTH DAILY BEFORE BREAKFAST   losartan (COZAAR) 25 MG tablet Take 1 tablet (25 mg total) by mouth every evening.   metFORMIN (GLUCOPHAGE) 1000 MG tablet TAKE 1 TABLET(1000 MG) BY MOUTH DAILY WITH SUPPER   metroNIDAZOLE (FLAGYL) 500 MG tablet Take 500 mg by mouth 2 (two) times daily.   omeprazole (PRILOSEC) 20 MG capsule TAKE 1 CAPSULE(20 MG) BY MOUTH DAILY   SPIRIVA HANDIHALER 18 MCG inhalation capsule PLACE  1 CAPSULE INTO INHALER AND INHALE EVERY DAY AS NEEDED   torsemide (DEMADEX) 20 MG tablet Take 40 mg by mouth daily.   No facility-administered encounter medications on file as of 08/13/2022.    Allergies (verified) Oxycodone and Prednisone   History: Past Medical History:  Diagnosis Date   Actinic keratosis    Aortic atherosclerosis (HCC) 05/25/2020   Arthritis    spinal stenosis   Cardiomyopathy - presumed to be nonischemic    a. 03/2020 Echo: EF 40-45%, gr2 DD. Nl RV fxn. Mildly dil LA. Mild MR/ao sclerosis; b. 03/2020 MV: EF 30-44%, no ischemia. Cor Ca2+ in pLAD.   CHF (congestive heart failure) (HCC)    COPD (chronic  obstructive pulmonary disease) (HCC)    Coronary artery calcification seen on CT scan    a. 03/2020 MV: CT attenuation images show Ao and mod pLAD Ca2+.   Diabetes mellitus without complication (HCC)    Gastric ulcer 06/2010   treated with Prolisec- states no problems now   Hyperlipidemia    Hypertension    PCP Dr Julieanne Manson   [place]   Hypothyroidism    Left bundle branch block (LBBB)    Neuromuscular disorder (HCC)    slight numbness right toes- comes and goes   Peripheral vascular disease (HCC)    varicose veins left leg   Pneumonia    PSVT (paroxysmal supraventricular tachycardia)    a. 03/2020 Zio: Avg HR 87 (66-211).  4 beats NSVT.  43 PSVT episodes, longest 10h 88m @ avg of 153 bpm (max 211).  Seen by EP-->amio started (pt wished to avoid EPS/RFCA).   Seasonal allergies    Shortness of breath    Skin cancer    multiple from face   Squamous cell carcinoma of skin 03/23/2013   L dorsal hand   Squamous cell carcinoma of skin 02/04/2014   R forearm/in situ, L pretibial   Squamous cell carcinoma of skin 09/24/2018   R thumb   Squamous cell carcinoma of skin 09/08/2019   L forearm - ED&C   Squamous cell carcinoma of skin 09/08/2019   R dosal hand - ED&C   Squamous cell carcinoma of skin 06/20/2021   R cheek preauricular, excised   Past Surgical History:  Procedure Laterality Date   BACK SURGERY     4/12  Dr Simonne Come- for lumbar stenosis   BREAST CYST ASPIRATION Left 1965   negative   BREAST SURGERY  1962   lumpectomy with biopsy   left   CARPAL TUNNEL RELEASE     right   COLONOSCOPY     COLONOSCOPY WITH PROPOFOL N/A 06/30/2015   Procedure: COLONOSCOPY WITH PROPOFOL;  Surgeon: Wallace Cullens, MD;  Location: [facility] ENDOSCOPY;  Service: Gastroenterology;  Laterality: N/A;   COLONOSCOPY WITH PROPOFOL N/A 01/22/2017   Procedure: COLONOSCOPY WITH PROPOFOL;  Surgeon: Christena Deem, MD;  Location[facility] ENDOSCOPY;  Service: Endoscopy;  Laterality: N/A;   COLONOSCOPY WITH  PROPOFOL N/A 01/28/2019   Procedure: COLONOSCOPY WITH PROPOFOL;  Surgeon: Toledo, Boykin Nearing, MD;  Location: [facility] ENDOSCOPY;  Service: Gastroenterology;  Laterality: N/A;   ESOPHAGOGASTRODUODENOSCOPY     ESOPHAGOGASTRODUODENOSCOPY (EGD) WITH PROPOFOL N/A 01/28/2019   Procedure: ESOPHAGOGASTRODUODENOSCOPY (EGD) WITH PROPOFOL;  Surgeon: Toledo, Boykin Nearing, MD;  Location: [facility] ENDOSCOPY;  Service: Gastroenterology;  Laterality: N/A;   EYE SURGERY     cataract extraction with IOL bilaterally   JOINT REPLACEMENT     LUMBAR LAMINECTOMY/DECOMPRESSION MICRODISCECTOMY  05/24/2011   Procedure: LUMBAR LAMINECTOMY/DECOMPRESSION MICRODISCECTOMY  2 LEVELS;  Surgeon: Drucilla Schmidt, MD;  Location: [facility] ORS;  Service: Orthopedics;  Laterality: N/A;  L2-L3, L3-L4 (x-ray)   RIGHT OOPHORECTOMY     due to STAPH INFECTION   Family History  Problem Relation Age of Onset   Dementia Mother    Lung cancer Father    Heart disease Father    Hypertension Sister    Breast cancer Maternal Aunt    Arthritis Son    Social History   Socioeconomic History   Marital status: Widowed    Spouse name: Not on file   Number of children: Not on file   Years of education: Not on file   Highest education level: Not on file  Occupational History   Not on file  Tobacco Use   Smoking status: Former    Packs/day: 0.50    Years: 50.00    Additional pack years: 0.00    Total pack years: 25.00    Types: Cigarettes    Quit date: 05/16/2004    Years since quitting: 18.2   Smokeless tobacco: Never  Vaping Use   Vaping Use: Never used  Substance and Sexual Activity   Alcohol use: Yes    Comment: socially-1 glass of wine or [place] icea tea once a month   Drug use: No   Sexual activity: Yes    Birth control/protection: None  Other Topics Concern   Not on file  Social History Narrative   Not on file   Social Determinants of Health   Financial Resource Strain: Low Risk  (08/13/2022)   Overall Financial Resource Strain  (CARDIA)    Difficulty of Paying Living Expenses: Not hard at all  Food Insecurity: No Food Insecurity (08/13/2022)   Hunger Vital Sign    Worried About Running Out of Food in the Last Year: Never true    Ran Out of Food in the Last Year: Never true  Transportation Needs: No Transportation Needs (08/13/2022)   PRAPARE - Administrator, Civil Service (Medical): No    Lack of Transportation (Non-Medical): No  Physical Activity: Inactive (08/13/2022)   Exercise Vital Sign    Days of Exercise per Week: 0 days    Minutes of Exercise per Session: 0 min  Stress: No Stress Concern Present (08/13/2022)   [employer] of Occupational Health - Occupational Stress Questionnaire    Feeling of Stress : Not at all  Social Connections: Moderately Isolated (08/13/2022)   Social Connection and Isolation Panel [NHANES]    Frequency of Communication with Friends and Family: More than three times a week    Frequency of Social Gatherings with Friends and Family: Three times a week    Attends Religious Services: More than 4 times per year    Active Member of Clubs or Organizations: No    Attends Banker Meetings: Never    Marital Status: Widowed    Tobacco Counseling Counseling given: Not Answered   Clinical Intake:  Pre-visit preparation completed: Yes  Pain : 0-10 Pain Score: 5  Pain Location: Generalized Pain Descriptors / Indicators: Aching Pain Onset: More than a month ago Pain Frequency: Constant Pain Relieving Factors: nothing  Pain Relieving Factors: nothing  BMI - recorded: 31.07 Nutritional Status: BMI > 30  Obese Nutritional Risks: None Diabetes: Yes CBG done?: No Did pt. bring in CBG monitor from home?: No  How often do you need to have someone help you when you read instructions, pamphlets, or other written materials  from your doctor or pharmacy?: 1 - Never  Diabetic?yes  Interpreter Needed?: No  Comments: lives with son Information entered by  :: B.Amayrany Cafaro,LPN   Activities of Daily Living    08/13/2022   11:00 AM 04/05/2022    2:20 PM  In your present state of health, do you have any difficulty performing the following activities:  Hearing? 1 1  Vision? 0 1  Difficulty concentrating or making decisions? 1 1  Walking or climbing stairs? 1 1  Dressing or bathing? 0 0  Doing errands, shopping? 1 1  Preparing Food and eating ? N   Using the Toilet? N   In the past six months, have you accidently leaked urine? N   Do you have problems with loss of bowel control? N   Managing your Medications? N   Managing your Finances? N   Housekeeping or managing your Housekeeping? Y     Patient Care Team: Ronnald Ramp, MD as PCP - General (Family Medicine) Antonieta Iba, MD as PCP - Cardiology (Cardiology) Lanier Prude, MD as PCP - Electrophysiology (Cardiology) Kennedy Bucker, MD as Consulting Physician (Orthopedic Surgery) Juanell Fairly, RN as Case Manager Gaspar Cola, [facility] (Pharmacist)  Indicate any recent Medical Services you may have received from other than [facility] providers in the past year (date may be approximate).     Assessment:   This is a routine wellness examination for Zykira.  Hearing/Vision screen Hearing Screening - Comments:: Adequate hearing at times..says worsening Vision Screening - Comments:: Adequate vision w/glasses Dr Clydene Pugh  Dietary issues and exercise activities discussed: Current Exercise Habits: The patient does not participate in regular exercise at present, Exercise limited by: cardiac condition(s);neurologic condition(s);orthopedic condition(s)   Goals Addressed             This Visit's Progress    Eat more fruits and vegetables   On track    Recommend to eat 1-2 cups of fruit a day.       Depression Screen    08/13/2022   10:57 AM 04/05/2022    2:20 PM 11/16/2021    3:22 PM 10/18/2020    1:24 PM 10/03/2020   11:05 AM 11/04/2018    2:13 PM 08/07/2016    11:03 AM  PHQ 2/9 Scores  PHQ - 2 Score 1 1 3  0 0 0 0  PHQ- 9 Score  4 9    2     Fall Risk    08/13/2022   10:54 AM 04/05/2022    2:20 PM 11/16/2021    3:22 PM 04/24/2021    1:08 PM 08/16/2020   11:08 AM  Fall Risk   Falls in the past year? 1 1 1  0 0  Number falls in past yr: 1 1 1  0 0  Comment  4     Injury with Fall? 0 0 0 0 0  Risk for fall due to : History of fall(s) Impaired balance/gait History of fall(s);Impaired balance/gait;Impaired mobility    Follow up Education provided;Falls prevention discussed  Falls evaluation completed Falls evaluation completed Falls evaluation completed    FALL RISK PREVENTION PERTAINING TO THE HOME:  Any stairs in or around the home? Yes  If so, are there any without handrails? Yes  Home free of loose throw rugs in walkways, pet beds, electrical cords, etc? Yes  Adequate lighting in your home to reduce risk of falls? Yes   ASSISTIVE DEVICES UTILIZED TO PREVENT FALLS:  Life alert? Yes watch Use of a cane,  walker or w/c? Yes walker and w/c Grab bars in the bathroom? Yes  Shower chair or bench in shower? Yes  Elevated toilet seat or a handicapped toilet? Yes   Cognitive Function:        08/13/2022   11:02 AM 11/04/2018    2:11 PM 08/07/2016   11:04 AM  6CIT Screen  What Year? 0 points 0 points 0 points  What month? 0 points 0 points 0 points  What time? 0 points 0 points 0 points  Count back from 20 0 points 0 points 0 points  Months in reverse 0 points 0 points 0 points  Repeat phrase 0 points 0 points 2 points  Total Score 0 points 0 points 2 points    Immunizations Immunization History  Administered Date(s) Administered   Fluad Quad(high Dose 65+) 11/27/2018, 12/01/2019, 01/04/2021, 12/12/2021   Influenza, High Dose Seasonal PF 12/18/2016   Influenza,inj,Quad PF,6+ Mos 11/12/2013   Influenza-Unspecified 11/25/2014, 12/24/2017   Pneumococcal Conjugate-13 08/07/2016   Pneumococcal Polysaccharide-23 03/27/2003   Tdap 02/10/2013    Zoster, Live 02/10/2013, 11/26/2014    TDAP status: Up to date  Flu Vaccine status: Up to date  Pneumococcal vaccine status: Up to date  Covid-19 vaccine status: Completed vaccines  Qualifies for Shingles Vaccine? Yes   Zostavax completed Yes   Shingrix Completed?: Yes  Screening Tests Health Maintenance  Topic Date Due   COVID-19 Vaccine (1) Never done   Zoster Vaccines- Shingrix (1 of 2) Never done   FOOT EXAM  11/15/2017   OPHTHALMOLOGY EXAM  12/28/2021   HEMOGLOBIN A1C  06/12/2022   INFLUENZA VACCINE  10/25/2022   DTaP/Tdap/Td (2 - Td or Tdap) 02/11/2023   Medicare Annual Wellness (AWV)  08/13/2023   Pneumonia Vaccine 33+ Years old  Completed   DEXA SCAN  Completed   HPV VACCINES  Aged Out    Health Maintenance  Health Maintenance Due  Topic Date Due   COVID-19 Vaccine (1) Never done   Zoster Vaccines- Shingrix (1 of 2) Never done   FOOT EXAM  11/15/2017   OPHTHALMOLOGY EXAM  12/28/2021   HEMOGLOBIN A1C  06/12/2022    Colorectal cancer screening: No longer required.   Mammogram status: No longer required due to age.  Bone Density status: Completed yes. Results reflect: Bone density results: NORMAL. Repeat every 5 years.  Lung Cancer Screening: (Low Dose CT Chest recommended if Age 55-80 years, 30 pack-year currently smoking OR have quit w/in 15years.) does not qualify.   Lung Cancer Screening Referral: no  Additional Screening:  Hepatitis C Screening: does not qualify; Completed yes  Vision Screening: Recommended annual ophthalmology exams for early detection of glaucoma and other disorders of the eye. Is the patient up to date with their annual eye exam?  Yes  Who is the provider or what is the name of the office in which the patient attends annual eye exams? Dr Clydene Pugh If pt is not established with a provider, would they like to be referred to a provider to establish care? No .   Dental Screening: Recommended annual dental exams for proper oral  hygiene  Community Resource Referral / Chronic Care Management: CRR required this visit?  No   CCM required this visit?  No    Plan:     I have personally reviewed and noted the following in the patient's chart:   Medical and social history Use of alcohol, tobacco or illicit drugs  Current medications and supplements including opioid prescriptions. Patient is  not currently taking opioid prescriptions. Functional ability and status Nutritional status Physical activity Advanced directives List of other physicians Hospitalizations, surgeries, and ER visits in previous 12 months Vitals Screenings to include cognitive, depression, and falls Referrals and appointments  In addition, I have reviewed and discussed with patient certain preventive protocols, quality metrics, and best practice recommendations. A written personalized care plan for preventive services as well as general preventive health recommendations were provided to patient.     Sue Lush, LPN   [id number]   Nurse Notes: pt indicates she has not been feeling her best the last week. She also relays she has an infected 2nd toe on rt foot and is on ABT. She has no concerns or questions at this time.

## 2022-08-14 NOTE — Telephone Encounter (Signed)
Requested Prescriptions  Pending Prescriptions Disp Refills   metFORMIN (GLUCOPHAGE) 1000 MG tablet [Pharmacy Med Name: METFORMIN 1000MG  TABLETS] 90 tablet 0    Sig: TAKE 1 TABLET(1000 MG) BY MOUTH DAILY WITH SUPPER     Endocrinology:  Diabetes - Biguanides Failed - 08/13/2022 12:48 PM      Failed - Cr in normal range and within 360 days    Creatinine  Date Value Ref Range Status  07/09/2014 0.85 mg/dL Final    Comment:    4.09-8.11 NOTE: New Reference Range  06/01/14    Creatinine, Ser  Date Value Ref Range Status  07/13/2022 1.21 (H) 0.44 - 1.00 mg/dL Final         Failed - HBA1C is between 0 and 7.9 and within 180 days    Hgb A1c MFr Bld  Date Value Ref Range Status  12/12/2021 6.9 (H) 4.8 - 5.6 % Final    Comment:             Prediabetes: 5.7 - 6.4          Diabetes: >6.4          Glycemic control for adults with diabetes: <7.0          Failed - eGFR in normal range and within 360 days    EGFR (African American)  Date Value Ref Range Status  07/09/2014 >60  Final   GFR calc Af Amer  Date Value Ref Range Status  07/29/2019 84 >59 mL/min/1.73 Final    Comment:    **[facility] currently reports eGFR in compliance with the current**   recommendations of the [employer]. [facility] will   update reporting as new guidelines are published from the NKF-ASN   Task force.    EGFR (Non-African Amer.)  Date Value Ref Range Status  07/09/2014 >60  Final    Comment:    eGFR values <65mL/min/1.73 m2 may be an indication of chronic kidney disease (CKD). Calculated eGFR is useful in patients with stable renal function. The eGFR calculation will not be reliable in acutely ill patients when serum creatinine is changing rapidly. It is not useful in patients on dialysis. The eGFR calculation may not be applicable to patients at the low and high extremes of body sizes, pregnant women, and vegetarians.    GFR, Estimated  Date Value Ref Range Status  07/13/2022 44  (L) >60 mL/min Final    Comment:    (NOTE) Calculated using the CKD-EPI Creatinine Equation (2021)    eGFR  Date Value Ref Range Status  04/09/2022 44 (L) >59 mL/min/1.73 Final         Failed - B12 Level in normal range and within 720 days    No results found for: "VITAMINB12"       Passed - Valid encounter within last 6 months    Recent Outpatient Visits           4 months ago Hospital discharge follow-up   [facility] [name], Maynard, MD   7 months ago Lower limb ulcer, ankle, right, with unspecified severity [facility])   [facility] Caro Laroche, DO   8 months ago Need for immunization against influenza   [facility] Bosie Clos, MD   9 months ago Postherpetic neuralgia   [facility] Bosie Clos, MD   9 months ago Postherpetic neuralgia   [facility] Bosie Clos, MD  Future Appointments             In 3 weeks [name], Makiera, MD [facility]            Passed - CBC within normal limits and completed in the last 12 months    WBC  Date Value Ref Range Status  03/22/2022 6.2 4.0 - 10.5 K/uL Final   RBC  Date Value Ref Range Status  03/22/2022 3.85 (L) 3.87 - 5.11 MIL/uL Final   Hemoglobin  Date Value Ref Range Status  03/22/2022 8.5 (L) 12.0 - 15.0 g/dL Final  16/12/9602 54.0 (L) 11.1 - 15.9 g/dL Final   HCT  Date Value Ref Range Status  03/22/2022 31.6 (L) 36.0 - 46.0 % Final   Hematocrit  Date Value Ref Range Status  12/12/2021 36.3 34.0 - 46.6 % Final   MCHC  Date Value Ref Range Status  03/22/2022 26.9 (L) 30.0 - 36.0 g/dL Final   [facility]  Date Value Ref Range Status  03/22/2022 22.1 (L) 26.0 - 34.0 pg Final   MCV  Date Value Ref Range Status  03/22/2022 82.1 80.0 - 100.0 fL Final  12/12/2021 81 79 - 97 fL Final  06/23/2014 90 80 - 100  fL Final   No results found for: "PLTCOUNTKUC", "LABPLAT", "POCPLA" RDW  Date Value Ref Range Status  03/22/2022 18.3 (H) 11.5 - 15.5 % Final  12/12/2021 15.8 (H) 11.7 - 15.4 % Final  06/23/2014 24.3 (H) 11.5 - 14.5 % Final

## 2022-08-16 ENCOUNTER — Encounter: Payer: Medicare HMO | Admitting: Physician Assistant

## 2022-08-16 DIAGNOSIS — L97512 Non-pressure chronic ulcer of other part of right foot with fat layer exposed: Secondary | ICD-10-CM | POA: Diagnosis not present

## 2022-08-22 ENCOUNTER — Inpatient Hospital Stay: Payer: Medicare HMO

## 2022-08-22 ENCOUNTER — Other Ambulatory Visit: Payer: Self-pay

## 2022-08-22 ENCOUNTER — Encounter: Payer: Self-pay | Admitting: Family Medicine

## 2022-08-22 ENCOUNTER — Inpatient Hospital Stay
Admission: EM | Admit: 2022-08-22 | Discharge: 2022-08-31 | DRG: 617 | Disposition: A | Discharge status: Skilled Nursing Facility | Payer: Medicare HMO | Attending: Obstetrics and Gynecology | Admitting: Obstetrics and Gynecology

## 2022-08-22 ENCOUNTER — Encounter: Payer: Self-pay | Admitting: Emergency Medicine

## 2022-08-22 ENCOUNTER — Emergency Department: Payer: Medicare HMO

## 2022-08-22 ENCOUNTER — Ambulatory Visit (INDEPENDENT_AMBULATORY_CARE_PROVIDER_SITE_OTHER): Payer: Medicare HMO | Admitting: Family Medicine

## 2022-08-22 VITALS — BP 84/48 | HR 87 | Temp 98.0°F

## 2022-08-22 DIAGNOSIS — R627 Adult failure to thrive: Secondary | ICD-10-CM | POA: Diagnosis present

## 2022-08-22 DIAGNOSIS — Z751 Person awaiting admission to adequate facility elsewhere: Secondary | ICD-10-CM

## 2022-08-22 DIAGNOSIS — I5022 Chronic systolic (congestive) heart failure: Secondary | ICD-10-CM | POA: Diagnosis present

## 2022-08-22 DIAGNOSIS — R531 Weakness: Principal | ICD-10-CM

## 2022-08-22 DIAGNOSIS — E1169 Type 2 diabetes mellitus with other specified complication: Secondary | ICD-10-CM | POA: Diagnosis present

## 2022-08-22 DIAGNOSIS — R630 Anorexia: Secondary | ICD-10-CM | POA: Diagnosis not present

## 2022-08-22 DIAGNOSIS — Z961 Presence of intraocular lens: Secondary | ICD-10-CM | POA: Diagnosis present

## 2022-08-22 DIAGNOSIS — Z9841 Cataract extraction status, right eye: Secondary | ICD-10-CM

## 2022-08-22 DIAGNOSIS — J449 Chronic obstructive pulmonary disease, unspecified: Secondary | ICD-10-CM | POA: Diagnosis present

## 2022-08-22 DIAGNOSIS — E875 Hyperkalemia: Secondary | ICD-10-CM | POA: Diagnosis present

## 2022-08-22 DIAGNOSIS — E538 Deficiency of other specified B group vitamins: Secondary | ICD-10-CM | POA: Diagnosis present

## 2022-08-22 DIAGNOSIS — E1151 Type 2 diabetes mellitus with diabetic peripheral angiopathy without gangrene: Secondary | ICD-10-CM | POA: Diagnosis present

## 2022-08-22 DIAGNOSIS — I429 Cardiomyopathy, unspecified: Secondary | ICD-10-CM | POA: Diagnosis present

## 2022-08-22 DIAGNOSIS — L089 Local infection of the skin and subcutaneous tissue, unspecified: Secondary | ICD-10-CM | POA: Diagnosis not present

## 2022-08-22 DIAGNOSIS — I5020 Unspecified systolic (congestive) heart failure: Secondary | ICD-10-CM | POA: Diagnosis present

## 2022-08-22 DIAGNOSIS — E861 Hypovolemia: Secondary | ICD-10-CM | POA: Insufficient documentation

## 2022-08-22 DIAGNOSIS — R54 Age-related physical debility: Secondary | ICD-10-CM | POA: Diagnosis present

## 2022-08-22 DIAGNOSIS — E119 Type 2 diabetes mellitus without complications: Secondary | ICD-10-CM

## 2022-08-22 DIAGNOSIS — D649 Anemia, unspecified: Secondary | ICD-10-CM | POA: Diagnosis present

## 2022-08-22 DIAGNOSIS — Z888 Allergy status to other drugs, medicaments and biological substances status: Secondary | ICD-10-CM

## 2022-08-22 DIAGNOSIS — I130 Hypertensive heart and chronic kidney disease with heart failure and stage 1 through stage 4 chronic kidney disease, or unspecified chronic kidney disease: Secondary | ICD-10-CM | POA: Diagnosis present

## 2022-08-22 DIAGNOSIS — E1122 Type 2 diabetes mellitus with diabetic chronic kidney disease: Secondary | ICD-10-CM | POA: Diagnosis present

## 2022-08-22 DIAGNOSIS — Z87891 Personal history of nicotine dependence: Secondary | ICD-10-CM

## 2022-08-22 DIAGNOSIS — R400 Somnolence: Secondary | ICD-10-CM | POA: Diagnosis not present

## 2022-08-22 DIAGNOSIS — L97519 Non-pressure chronic ulcer of other part of right foot with unspecified severity: Secondary | ICD-10-CM | POA: Diagnosis present

## 2022-08-22 DIAGNOSIS — I70203 Unspecified atherosclerosis of native arteries of extremities, bilateral legs: Secondary | ICD-10-CM | POA: Diagnosis present

## 2022-08-22 DIAGNOSIS — I447 Left bundle-branch block, unspecified: Secondary | ICD-10-CM | POA: Diagnosis present

## 2022-08-22 DIAGNOSIS — D631 Anemia in chronic kidney disease: Secondary | ICD-10-CM | POA: Diagnosis present

## 2022-08-22 DIAGNOSIS — I959 Hypotension, unspecified: Secondary | ICD-10-CM | POA: Insufficient documentation

## 2022-08-22 DIAGNOSIS — E872 Acidosis, unspecified: Secondary | ICD-10-CM | POA: Diagnosis present

## 2022-08-22 DIAGNOSIS — E11621 Type 2 diabetes mellitus with foot ulcer: Secondary | ICD-10-CM | POA: Diagnosis present

## 2022-08-22 DIAGNOSIS — W19XXXA Unspecified fall, initial encounter: Secondary | ICD-10-CM | POA: Diagnosis not present

## 2022-08-22 DIAGNOSIS — N179 Acute kidney failure, unspecified: Secondary | ICD-10-CM

## 2022-08-22 DIAGNOSIS — I10 Essential (primary) hypertension: Secondary | ICD-10-CM | POA: Diagnosis present

## 2022-08-22 DIAGNOSIS — I428 Other cardiomyopathies: Secondary | ICD-10-CM | POA: Diagnosis not present

## 2022-08-22 DIAGNOSIS — I9589 Other hypotension: Secondary | ICD-10-CM | POA: Diagnosis present

## 2022-08-22 DIAGNOSIS — E785 Hyperlipidemia, unspecified: Secondary | ICD-10-CM | POA: Diagnosis present

## 2022-08-22 DIAGNOSIS — R700 Elevated erythrocyte sedimentation rate: Secondary | ICD-10-CM | POA: Diagnosis not present

## 2022-08-22 DIAGNOSIS — R5383 Other fatigue: Secondary | ICD-10-CM | POA: Diagnosis not present

## 2022-08-22 DIAGNOSIS — E039 Hypothyroidism, unspecified: Secondary | ICD-10-CM | POA: Diagnosis present

## 2022-08-22 DIAGNOSIS — I70235 Atherosclerosis of native arteries of right leg with ulceration of other part of foot: Secondary | ICD-10-CM | POA: Diagnosis not present

## 2022-08-22 DIAGNOSIS — Z801 Family history of malignant neoplasm of trachea, bronchus and lung: Secondary | ICD-10-CM

## 2022-08-22 DIAGNOSIS — I480 Paroxysmal atrial fibrillation: Secondary | ICD-10-CM | POA: Diagnosis present

## 2022-08-22 DIAGNOSIS — E860 Dehydration: Secondary | ICD-10-CM | POA: Diagnosis present

## 2022-08-22 DIAGNOSIS — M869 Osteomyelitis, unspecified: Secondary | ICD-10-CM | POA: Diagnosis not present

## 2022-08-22 DIAGNOSIS — S81011A Laceration without foreign body, right knee, initial encounter: Secondary | ICD-10-CM | POA: Diagnosis not present

## 2022-08-22 DIAGNOSIS — Z8249 Family history of ischemic heart disease and other diseases of the circulatory system: Secondary | ICD-10-CM

## 2022-08-22 DIAGNOSIS — E669 Obesity, unspecified: Secondary | ICD-10-CM | POA: Diagnosis present

## 2022-08-22 DIAGNOSIS — Z79899 Other long term (current) drug therapy: Secondary | ICD-10-CM

## 2022-08-22 DIAGNOSIS — Z885 Allergy status to narcotic agent status: Secondary | ICD-10-CM

## 2022-08-22 DIAGNOSIS — I708 Atherosclerosis of other arteries: Secondary | ICD-10-CM | POA: Diagnosis not present

## 2022-08-22 DIAGNOSIS — Z9842 Cataract extraction status, left eye: Secondary | ICD-10-CM

## 2022-08-22 DIAGNOSIS — Y92230 Patient room in hospital as the place of occurrence of the external cause: Secondary | ICD-10-CM | POA: Diagnosis not present

## 2022-08-22 DIAGNOSIS — M86671 Other chronic osteomyelitis, right ankle and foot: Secondary | ICD-10-CM | POA: Diagnosis present

## 2022-08-22 DIAGNOSIS — I701 Atherosclerosis of renal artery: Secondary | ICD-10-CM | POA: Diagnosis not present

## 2022-08-22 DIAGNOSIS — Z6832 Body mass index (BMI) 32.0-32.9, adult: Secondary | ICD-10-CM

## 2022-08-22 DIAGNOSIS — Z8711 Personal history of peptic ulcer disease: Secondary | ICD-10-CM

## 2022-08-22 DIAGNOSIS — Z66 Do not resuscitate: Secondary | ICD-10-CM | POA: Diagnosis present

## 2022-08-22 DIAGNOSIS — D72829 Elevated white blood cell count, unspecified: Secondary | ICD-10-CM | POA: Diagnosis present

## 2022-08-22 DIAGNOSIS — N184 Chronic kidney disease, stage 4 (severe): Secondary | ICD-10-CM | POA: Diagnosis present

## 2022-08-22 DIAGNOSIS — Z90721 Acquired absence of ovaries, unilateral: Secondary | ICD-10-CM

## 2022-08-22 DIAGNOSIS — I700 Atherosclerosis of aorta: Secondary | ICD-10-CM | POA: Diagnosis present

## 2022-08-22 DIAGNOSIS — Z7984 Long term (current) use of oral hypoglycemic drugs: Secondary | ICD-10-CM

## 2022-08-22 DIAGNOSIS — Z85828 Personal history of other malignant neoplasm of skin: Secondary | ICD-10-CM

## 2022-08-22 DIAGNOSIS — K219 Gastro-esophageal reflux disease without esophagitis: Secondary | ICD-10-CM | POA: Diagnosis present

## 2022-08-22 DIAGNOSIS — Z7982 Long term (current) use of aspirin: Secondary | ICD-10-CM

## 2022-08-22 DIAGNOSIS — I745 Embolism and thrombosis of iliac artery: Secondary | ICD-10-CM | POA: Diagnosis not present

## 2022-08-22 DIAGNOSIS — K649 Unspecified hemorrhoids: Secondary | ICD-10-CM | POA: Diagnosis present

## 2022-08-22 DIAGNOSIS — Z803 Family history of malignant neoplasm of breast: Secondary | ICD-10-CM

## 2022-08-22 DIAGNOSIS — S81012A Laceration without foreign body, left knee, initial encounter: Secondary | ICD-10-CM | POA: Diagnosis present

## 2022-08-22 DIAGNOSIS — I2510 Atherosclerotic heart disease of native coronary artery without angina pectoris: Secondary | ICD-10-CM | POA: Diagnosis present

## 2022-08-22 LAB — BASIC METABOLIC PANEL
Anion gap: 13 (ref 5–15)
BUN: 66 mg/dL — ABNORMAL HIGH (ref 8–23)
CO2: 22 mmol/L (ref 22–32)
Calcium: 9 mg/dL (ref 8.9–10.3)
Chloride: 106 mmol/L (ref 98–111)
Creatinine, Ser: 1.53 mg/dL — ABNORMAL HIGH (ref 0.44–1.00)
GFR, Estimated: 33 mL/min — ABNORMAL LOW (ref >60)
Glucose, Bld: 141 mg/dL — ABNORMAL HIGH (ref 70–99)
Potassium: 5.2 mmol/L — ABNORMAL HIGH (ref 3.5–5.1)
Sodium: 141 mmol/L (ref 135–145)

## 2022-08-22 LAB — CBC
HCT: 32.9 % — ABNORMAL LOW (ref 36.0–46.0)
Hemoglobin: 8.7 g/dL — ABNORMAL LOW (ref 12.0–15.0)
MCH: 21.2 pg — ABNORMAL LOW (ref 26.0–34.0)
MCHC: 26.4 g/dL — ABNORMAL LOW (ref 30.0–36.0)
MCV: 80 fL (ref 80.0–100.0)
Platelets: 386 K/uL (ref 150–400)
RBC: 4.11 MIL/uL (ref 3.87–5.11)
RDW: 21.5 % — ABNORMAL HIGH (ref 11.5–15.5)
WBC: 9.8 K/uL (ref 4.0–10.5)
nRBC: 0 % (ref 0.0–0.2)

## 2022-08-22 LAB — HEPATIC FUNCTION PANEL
ALT: 15 U/L (ref 0–44)
AST: 24 U/L (ref 15–41)
Albumin: 3 g/dL — ABNORMAL LOW (ref 3.5–5.0)
Alkaline Phosphatase: 92 U/L (ref 38–126)
Bilirubin, Direct: <0.1 mg/dL (ref 0.0–0.2)
Total Bilirubin: 0.5 mg/dL (ref 0.3–1.2)
Total Protein: 6.2 g/dL — ABNORMAL LOW (ref 6.5–8.1)

## 2022-08-22 LAB — URINALYSIS, ROUTINE W REFLEX MICROSCOPIC
Bilirubin Urine: NEGATIVE
Glucose, UA: 50 mg/dL — AB
Hgb urine dipstick: NEGATIVE
Ketones, ur: NEGATIVE mg/dL
Leukocytes,Ua: NEGATIVE
Nitrite: NEGATIVE
Protein, ur: NEGATIVE mg/dL
Specific Gravity, Urine: 1.016 (ref 1.005–1.030)
pH: 5 (ref 5.0–8.0)

## 2022-08-22 LAB — BRAIN NATRIURETIC PEPTIDE: B Natriuretic Peptide: 123.7 pg/mL — ABNORMAL HIGH (ref 0.0–100.0)

## 2022-08-22 LAB — D-DIMER, QUANTITATIVE: D-Dimer, Quant: 0.87 ug/mL-FEU — ABNORMAL HIGH (ref 0.00–0.50)

## 2022-08-22 LAB — CBG MONITORING, ED: Glucose-Capillary: 137 mg/dL — ABNORMAL HIGH (ref 70–99)

## 2022-08-22 LAB — LACTIC ACID, PLASMA
Lactic Acid, Venous: 3.1 mmol/L (ref 0.5–1.9)
Lactic Acid, Venous: 3.2 mmol/L (ref 0.5–1.9)

## 2022-08-22 LAB — SEDIMENTATION RATE: Sed Rate: 60 mm/h — ABNORMAL HIGH (ref 0–30)

## 2022-08-22 LAB — TROPONIN I (HIGH SENSITIVITY): Troponin I (High Sensitivity): 9 ng/L (ref <18)

## 2022-08-22 LAB — LIPASE, BLOOD: Lipase: 54 U/L — ABNORMAL HIGH (ref 11–51)

## 2022-08-22 MED ORDER — SODIUM CHLORIDE 0.9 % IV BOLUS: 500.0000 mL | Freq: Once | INTRAVENOUS | Status: DC

## 2022-08-22 MED ORDER — LACTATED RINGERS IV SOLN: INTRAVENOUS | Status: DC

## 2022-08-22 MED ORDER — SODIUM CHLORIDE 0.9 % IV BOLUS
500.0000 mL | Freq: Once | INTRAVENOUS | Status: AC
  Administered 2022-08-22: 500 mL via INTRAVENOUS

## 2022-08-22 MED ORDER — SODIUM CHLORIDE 0.9% FLUSH
3.0000 mL | Freq: Two times a day (BID) | INTRAVENOUS | Status: DC
  Administered 2022-08-23 – 2022-08-31 (×17): 3 mL via INTRAVENOUS

## 2022-08-22 MED ORDER — SODIUM ZIRCONIUM CYCLOSILICATE 10 G PO PACK
10.0000 g | PACK | Freq: Once | ORAL | Status: AC
  Administered 2022-08-23: 10 g via ORAL
  Filled 2022-08-22 (×2): qty 1

## 2022-08-22 MED ORDER — ENOXAPARIN SODIUM 30 MG/0.3ML IJ SOSY
30.0000 mg | PREFILLED_SYRINGE | INTRAMUSCULAR | Status: DC
  Administered 2022-08-23 – 2022-08-25 (×3): 30 mg via SUBCUTANEOUS
  Filled 2022-08-22 (×3): qty 0.3

## 2022-08-22 MED ORDER — SODIUM CHLORIDE 0.9 % IV BOLUS: 1000.0000 mL | Freq: Once | INTRAVENOUS | Status: DC

## 2022-08-22 MED ORDER — PIPERACILLIN-TAZOBACTAM 3.375 G IVPB 30 MIN
3.3750 g | Freq: Once | INTRAVENOUS | Status: AC
  Administered 2022-08-22: 3.375 g via INTRAVENOUS
  Filled 2022-08-22: qty 50

## 2022-08-22 MED ORDER — ACETAMINOPHEN 650 MG RE SUPP: 650.0000 mg | Freq: Four times a day (QID) | RECTAL | Status: DC | PRN

## 2022-08-22 MED ORDER — VANCOMYCIN HCL 1750 MG/350ML IV SOLN
1750.0000 mg | Freq: Once | INTRAVENOUS | Status: AC
  Administered 2022-08-22: 1750 mg via INTRAVENOUS
  Filled 2022-08-22: qty 350

## 2022-08-22 MED ORDER — PIPERACILLIN-TAZOBACTAM 3.375 G IVPB
3.3750 g | Freq: Three times a day (TID) | INTRAVENOUS | Status: DC
  Administered 2022-08-23 (×2): 3.375 g via INTRAVENOUS
  Filled 2022-08-22 (×2): qty 50

## 2022-08-22 MED ORDER — VANCOMYCIN VARIABLE DOSE PER UNSTABLE RENAL FUNCTION (PHARMACIST DOSING): Status: DC

## 2022-08-22 MED ORDER — ACETAMINOPHEN 325 MG PO TABS
650.0000 mg | ORAL_TABLET | Freq: Four times a day (QID) | ORAL | Status: DC | PRN
  Administered 2022-08-23 (×2): 650 mg via ORAL
  Filled 2022-08-22 (×2): qty 2

## 2022-08-22 NOTE — Assessment & Plan Note (Signed)
Most likely secondary to dehydration and metformin. Resolved with IV fluid. -Consider discontinuing metformin on discharge

## 2022-08-22 NOTE — Assessment & Plan Note (Addendum)
Hemoglobin decreased to 7.6.  Baseline appears to be around 8.7. 9.4 on admission most likely some hemoconcentration with dehydration. Anemia panel with anemia of chronic disease and vitamin B12 deficiency. -Starting on B12 supplement -Weekly Aranesp ordered

## 2022-08-22 NOTE — Assessment & Plan Note (Addendum)
Right second DIP joint .  Patient already has an MRI of the site from early May.  Patient has a chronic wound which is being taken care of at wound care center, no obvious sign of infection surrounding that wound.  Patient takes doxycycline at home.  Was given broad-spectrum antibiotics with Zosyn and vancomycin. Chronically elevated ESR with normal CRP. Podiatry was consulted and they are recommending toe amputation Vascular was also consulted for vascular clearance before amputation -Discontinuing Zosyn and vancomycin -Follow-up vascular and podiatry recommendations -Will hold all antibiotics for now

## 2022-08-22 NOTE — Assessment & Plan Note (Signed)
Acute, symptomatic Endorses good PO intake; however, tenting skin and consistent hypotension noted on exam Recommend ER at this time

## 2022-08-22 NOTE — Progress Notes (Signed)
PHARMACIST - PHYSICIAN COMMUNICATION  CONCERNING:  Enoxaparin (Lovenox) for DVT Prophylaxis    RECOMMENDATION: Patient was prescribed enoxaprin 40mg  q24 hours for VTE prophylaxis.   Filed Weights   08/22/22 1648  Weight: 81.6 kg (180 lb)    Body mass index is 30.9 kg/m.  Estimated Creatinine Clearance: 27.3 mL/min (A) (by C-G formula based on SCr of 1.53 mg/dL (H)).  Patient is candidate for enoxaparin 30mg  every 24 hours based on CrCl <80ml/min or Weight <45kg  DESCRIPTION: Pharmacy has adjusted enoxaparin dose per [facility] policy.  Patient is now receiving enoxaparin 30 mg every 24 hours   Otelia Sergeant, PharmD, [facility] 08/22/2022 11:31 PM

## 2022-08-22 NOTE — ED Triage Notes (Signed)
Pt bib POV by son sent over form PCP for BP 84/48. Pt complains of weakness, decreased appetite x 2 weeks. Denies vomiting or diarrhea.

## 2022-08-22 NOTE — Consult Note (Signed)
PHARMACY -  BRIEF ANTIBIOTIC NOTE   Pharmacy has received consult(s) for vancomycin from an ED provider.  The patient's profile has been reviewed for ht/wt/allergies/indication/available labs.    One time order(s) placed for  Vancomycin 1750 mg   Further antibiotics/pharmacy consults should be ordered by admitting physician if indicated.                       Thank you, Sharen Hones, PharmD, BCPS Clinical Pharmacist   08/22/2022  8:24 PM

## 2022-08-22 NOTE — H&P (Addendum)
History and Physical    Patient: Kathryn [name]:[id number] DOB: [birth year] DOA: 08/22/2022 DOS: the patient was seen and examined on 08/22/2022 PCP: Ronnald Ramp, MD  Patient coming from: Home  Chief Complaint:  Chief Complaint  Patient presents with   Weakness   Hypotension   HPI: Kathryn [name] is a 89 y.o. female with medical history significant of known osteomyelitis of the right second toe based on MRI that is in the system from early May of this year.  Patient has been following wound care of wound for evaluation.  It does not seem that patient has seen podiatry.  Patient has seen vascular surgery in the past.  Patient reports chronic syndrome lasting several months of on and off fatigue, poor appetite.  And she was at her baseline being able to ambulate with a walker and eat and drink per usual till about 2 weeks ago.  Since then she reports a rather severe loss of appetite associated with fatigue and wanting to sleep most of the time.  She does not report presyncope or positional changes does not report chest pain or shortness of breath does not report leg swelling rash on skin does not report rigors or fevers does not report vomiting or diarrhea or burning with micturition.  Patient was so fatigued that daughter insisted that patient see provider today.  Patient was therefore evaluated at an outpatient clinic where patient was found to be hypotensive.  Patient sent to the ER at [facility].  Per chart review patient's blood pressure was 84/48 at outpatient facility. ER course is notable for received of IV fluids and IV antibiotics, finding of lactic acidosis, worsening creatinine and BUN.  Patient has been given IV fluids and last blood pressure is 127/34.  Patient reports feeling better now.  Reports feeling as if she could "lay down and die "at the time of initial presentation to the ER.  Review of Systems: As mentioned in the history of present illness. All other systems  reviewed and are negative. Past Medical History:  Diagnosis Date   Actinic keratosis    Aortic atherosclerosis (HCC) 05/25/2020   Arthritis    spinal stenosis   Cardiomyopathy - presumed to be nonischemic    a. 03/2020 Echo: EF 40-45%, gr2 DD. Nl RV fxn. Mildly dil LA. Mild MR/ao sclerosis; b. 03/2020 MV: EF 30-44%, no ischemia. Cor Ca2+ in pLAD.   CHF (congestive heart failure) (HCC)    COPD (chronic obstructive pulmonary disease) (HCC)    Coronary artery calcification seen on CT scan    a. 03/2020 MV: CT attenuation images show Ao and mod pLAD Ca2+.   Diabetes mellitus without complication (HCC)    Gastric ulcer 06/2010   treated with Prolisec- states no problems now   Hyperlipidemia    Hypertension    PCP Dr Julieanne Manson   [place]   Hypothyroidism    Left bundle branch block (LBBB)    Neuromuscular disorder (HCC)    slight numbness right toes- comes and goes   Peripheral vascular disease (HCC)    varicose veins left leg   Pneumonia    PSVT (paroxysmal supraventricular tachycardia)    a. 03/2020 Zio: Avg HR 87 (66-211).  4 beats NSVT.  43 PSVT episodes, longest 10h 1m @ avg of 153 bpm (max 211).  Seen by EP-->amio started (pt wished to avoid EPS/RFCA).   Seasonal allergies    Shortness of breath    Skin cancer    multiple from face  Squamous cell carcinoma of skin 03/23/2013   L dorsal hand   Squamous cell carcinoma of skin 02/04/2014   R forearm/in situ, L pretibial   Squamous cell carcinoma of skin 09/24/2018   R thumb   Squamous cell carcinoma of skin 09/08/2019   L forearm - ED&C   Squamous cell carcinoma of skin 09/08/2019   R dosal hand - ED&C   Squamous cell carcinoma of skin 06/20/2021   R cheek preauricular, excised   Past Surgical History:  Procedure Laterality Date   BACK SURGERY     4/12  Dr Simonne Come- for lumbar stenosis   BREAST CYST ASPIRATION Left 1965   negative   BREAST SURGERY  1962   lumpectomy with biopsy   left   CARPAL TUNNEL RELEASE      right   COLONOSCOPY     COLONOSCOPY WITH PROPOFOL N/A 06/30/2015   Procedure: COLONOSCOPY WITH PROPOFOL;  Surgeon: Wallace Cullens, MD;  Location[facility] ENDOSCOPY;  Service: Gastroenterology;  Laterality: N/A;   COLONOSCOPY WITH PROPOFOL N/A 01/22/2017   Procedure: COLONOSCOPY WITH PROPOFOL;  Surgeon: Christena Deem, MD;  Location: [facility] ENDOSCOPY;  Service: Endoscopy;  Laterality: N/A;   COLONOSCOPY WITH PROPOFOL N/A 01/28/2019   Procedure: COLONOSCOPY WITH PROPOFOL;  Surgeon: Toledo, Boykin Nearing, MD;  Location: [facility] ENDOSCOPY;  Service: Gastroenterology;  Laterality: N/A;   ESOPHAGOGASTRODUODENOSCOPY     ESOPHAGOGASTRODUODENOSCOPY (EGD) WITH PROPOFOL N/A 01/28/2019   Procedure: ESOPHAGOGASTRODUODENOSCOPY (EGD) WITH PROPOFOL;  Surgeon: Toledo, Boykin Nearing, MD;  Location: [facility] ENDOSCOPY;  Service: Gastroenterology;  Laterality: N/A;   EYE SURGERY     cataract extraction with IOL bilaterally   JOINT REPLACEMENT     LUMBAR LAMINECTOMY/DECOMPRESSION MICRODISCECTOMY  05/24/2011   Procedure: LUMBAR LAMINECTOMY/DECOMPRESSION MICRODISCECTOMY 2 LEVELS;  Surgeon: Drucilla Schmidt, MD;  Location: [facility] ORS;  Service: Orthopedics;  Laterality: N/A;  L2-L3, L3-L4 (x-ray)   RIGHT OOPHORECTOMY     due to STAPH INFECTION   Social History:  reports that she quit smoking about 18 years ago. Her smoking use included cigarettes. She has a 25.00 pack-year smoking history. She has never used smokeless tobacco. She reports current alcohol use. She reports that she does not use drugs.  Allergies  Allergen Reactions   Oxycodone Other (See Comments)    Mental status change   Prednisone     swelling, bruising.    Family History  Problem Relation Age of Onset   Dementia Mother    Lung cancer Father    Heart disease Father    Hypertension Sister    Breast cancer Maternal Aunt    Arthritis Son     Prior to Admission medications   Medication Sig Start Date End Date Taking? Authorizing Provider  acetaminophen  (TYLENOL) 500 MG tablet Take 1,000 mg by mouth every 6 (six) hours as needed. Pain     [provider]  albuterol (PROVENTIL) (2.5 MG/3ML) 0.083% nebulizer solution USE 1 VIAL VIA NEBULIZER EVERY 6 HOURS AS NEEDED FOR WHEEZING OR SHORTNESS OF BREATH 10/23/21   Bosie Clos, MD  amiodarone (PACERONE) 200 MG tablet Take 0.5 tablets (100 mg total) by mouth daily. 12/27/21   Lanier Prude, MD  aspirin 81 MG EC tablet Take 81 mg by mouth daily. Swallow whole.    [provider]  atorvastatin (LIPITOR) 40 MG tablet TAKE 1 TABLET(40 MG) BY MOUTH DAILY 08/06/22   Antonieta Iba, MD  carvedilol (COREG) 6.25 MG tablet TAKE 1 TABLET(6.25 MG) BY MOUTH TWICE  DAILY WITH A MEAL 08/13/22   Antonieta Iba, MD  dapagliflozin propanediol (FARXIGA) 10 MG TABS tablet TAKE 1 TABLET(10 MG) BY MOUTH DAILY 07/16/22   Antonieta Iba, MD  doxycycline (VIBRAMYCIN) 100 MG capsule Take 100 mg by mouth 2 (two) times daily. 07/16/22   [provider]  gabapentin (NEURONTIN) 400 MG capsule Take 1 capsule (400 mg total) by mouth 4 (four) times daily. 04/05/22   [name], Tawanna Cooler, MD  glucose blood (CONTOUR NEXT TEST) test strip Check sugar once daily  DX E11.9 11/16/16   Bosie Clos, MD  hydrOXYzine (ATARAX) 10 MG tablet TAKE 1 TO 2 TABLETS(10 TO 20 MG) BY MOUTH EVERY 6 HOURS AS NEEDED 07/17/22   [name], Tawanna Cooler, MD  levothyroxine (SYNTHROID) 100 MCG tablet TAKE 1 TABLET(100 MCG) BY MOUTH DAILY BEFORE BREAKFAST 02/26/22   Caro Laroche, DO  losartan (COZAAR) 25 MG tablet Take 1 tablet (25 mg total) by mouth every evening. 07/17/21   Delma Freeze, FNP  metFORMIN (GLUCOPHAGE) 1000 MG tablet TAKE 1 TABLET(1000 MG) BY MOUTH DAILY WITH SUPPER 08/14/22   [name], Makiera, MD  omeprazole (PRILOSEC) 20 MG capsule TAKE 1 CAPSULE(20 MG) BY MOUTH DAILY 01/01/22   Ostwalt, Janna, PA-C  SPIRIVA HANDIHALER 18 MCG inhalation capsule PLACE 1 CAPSULE INTO INHALER AND INHALE  EVERY DAY AS NEEDED 05/25/21   Bosie Clos, MD  torsemide (DEMADEX) 20 MG tablet Take 40 mg by mouth daily.    [provider]    Physical Exam: Vitals:   08/22/22 1930 08/22/22 2000 08/22/22 2030 08/22/22 2116  BP: (!) 119/56 (!) 128/53 (!) 127/34   Pulse: 79 72 76   Resp: 13 13 15    Temp:    98.4 F (36.9 C)  TempSrc:    Axillary  SpO2: 94% 100% 98%   Weight:      Height:       General: Patient does not appear to be distress at this evaluation.  Patient gives a fairly coherent account of her presentation. Respiratory exam: Bilateral intravesicular Cardiovascular exam S1-S2 normal Jugular venous distention cannot be evaluated as patient has several skin folds over the site due to wrinkling Abdomen soft nontender Extremities: Fingertips and toes appear to be cool.  With diminished but preserved capillary refill.  Dorsalis pedis cannot be palpated on either side.  Good function.  No evidence of gangrene or skin changes.  The distal interphalangeal joint on the right second toe dorsal aspect has subcentimeter I would guess around 5 mm round ulcer that has that is deep and base is hard to evaluate.  However there is no foul smell or discharge at the site no spreading erythema.  Data Reviewed:  Labs on Admission:  Results for orders placed or performed during the hospital encounter of 08/22/22 (from the past 24 hour(s))  Urinalysis, Routine w reflex microscopic -Urine, Clean Catch     Status: Abnormal   Collection Time: 08/22/22  4:49 PM  Result Value Ref Range   Color, Urine YELLOW (A) YELLOW   APPearance HAZY (A) CLEAR   Specific Gravity, Urine 1.016 1.005 - 1.030   pH 5.0 5.0 - 8.0   Glucose, UA 50 (A) NEGATIVE mg/dL   Hgb urine dipstick NEGATIVE NEGATIVE   Bilirubin Urine NEGATIVE NEGATIVE   Ketones, ur NEGATIVE NEGATIVE mg/dL   Protein, ur NEGATIVE NEGATIVE mg/dL   Nitrite NEGATIVE NEGATIVE   Leukocytes,Ua NEGATIVE NEGATIVE  CBG monitoring, ED     Status:  Abnormal  Collection Time: 08/22/22  4:51 PM  Result Value Ref Range   Glucose-Capillary 137 (H) 70 - 99 mg/dL  Basic metabolic panel     Status: Abnormal   Collection Time: 08/22/22  4:52 PM  Result Value Ref Range   Sodium 141 135 - 145 mmol/L   Potassium 5.2 (H) 3.5 - 5.1 mmol/L   Chloride 106 98 - 111 mmol/L   CO2 22 22 - 32 mmol/L   Glucose, Bld 141 (H) 70 - 99 mg/dL   BUN 66 (H) 8 - 23 mg/dL   Creatinine, Ser 1.61 (H) 0.44 - 1.00 mg/dL   Calcium 9.0 8.9 - 09.6 mg/dL   GFR, Estimated 33 (L) >60 mL/min   Anion gap 13 5 - 15  CBC     Status: Abnormal   Collection Time: 08/22/22  4:52 PM  Result Value Ref Range   WBC 9.8 4.0 - 10.5 K/uL   RBC 4.11 3.87 - 5.11 MIL/uL   Hemoglobin 8.7 (L) 12.0 - 15.0 g/dL   HCT 04.5 (L) 40.9 - 81.1 %   MCV 80.0 80.0 - 100.0 fL   MCH 21.2 (L) 26.0 - 34.0 pg   MCHC 26.4 (L) 30.0 - 36.0 g/dL   RDW 91.4 (H) 78.2 - 95.6 %   Platelets 386 150 - 400 K/uL   nRBC 0.0 0.0 - 0.2 %  Hepatic function panel     Status: Abnormal   Collection Time: 08/22/22  6:30 PM  Result Value Ref Range   Total Protein 6.2 (L) 6.5 - 8.1 g/dL   Albumin 3.0 (L) 3.5 - 5.0 g/dL   AST 24 15 - 41 U/L   ALT 15 0 - 44 U/L   Alkaline Phosphatase 92 38 - 126 U/L   Total Bilirubin 0.5 0.3 - 1.2 mg/dL   Bilirubin, Direct <2.1 0.0 - 0.2 mg/dL   Indirect Bilirubin NOT CALCULATED 0.3 - 0.9 mg/dL  Lipase, blood     Status: Abnormal   Collection Time: 08/22/22  6:30 PM  Result Value Ref Range   Lipase 54 (H) 11 - 51 U/L  Lactic acid, plasma     Status: Abnormal   Collection Time: 08/22/22  6:30 PM  Result Value Ref Range   Lactic Acid, Venous 3.1 (HH) 0.5 - 1.9 mmol/L  Brain natriuretic peptide     Status: Abnormal   Collection Time: 08/22/22  6:30 PM  Result Value Ref Range   B Natriuretic Peptide 123.7 (H) 0.0 - 100.0 pg/mL  Troponin I (High Sensitivity)     Status: None   Collection Time: 08/22/22  6:30 PM  Result Value Ref Range   Troponin I (High Sensitivity) 9 <18  ng/L  Sedimentation rate     Status: Abnormal   Collection Time: 08/22/22  6:30 PM  Result Value Ref Range   Sed Rate 60 (H) 0 - 30 mm/hr   Basic Metabolic Panel: Recent Labs  Lab 08/22/22 1652  NA 141  K 5.2*  CL 106  CO2 22  GLUCOSE 141*  BUN 66*  CREATININE 1.53*  CALCIUM 9.0   Liver Function Tests: Recent Labs  Lab 08/22/22 1830  AST 24  ALT 15  ALKPHOS 92  BILITOT 0.5  PROT 6.2*  ALBUMIN 3.0*   Recent Labs  Lab 08/22/22 1830  LIPASE 54*   No results for input(s): "AMMONIA" in the last 168 hours. CBC: Recent Labs  Lab 08/22/22 1652  WBC 9.8  HGB 8.7*  HCT 32.9*  MCV  80.0  PLT 386   Cardiac Enzymes: Recent Labs  Lab 08/22/22 1830  TROPONINIHS 9    BNP (last 3 results) No results for input(s): "PROBNP" in the last 8760 hours. CBG: Recent Labs  Lab 08/22/22 1651  GLUCAP 137*    Radiological Exams on Admission:  DG Toe 2nd Right  Addendum Date: 08/22/2022   ADDENDUM REPORT: 08/22/2022 20:57 ADDENDUM: Given the provided history of a soft tissue ulceration involving the second right toe, the previously noted cortical defect involving the distal aspect of the proximal phalanx may represent sequelae associated with acute osteomyelitis. MRI correlation is recommended. Electronically Signed   By: Aram Candela M.D.   On: 08/22/2022 20:57   Result Date: 08/22/2022 CLINICAL DATA:  Second right toe pain and swelling. EXAM: RIGHT SECOND TOE COMPARISON:  May 14, 2022 FINDINGS: There is an acute fracture deformity extending through the distal aspect of the proximal phalanx of the second right toe. There is no evidence of dislocation. Moderate to marked severity diffuse soft tissue swelling is seen. IMPRESSION: Acute fracture of the proximal phalanx of the second right toe. Electronically Signed: By: Aram Candela M.D. On: 08/22/2022 19:37    EKG: Independently reviewed.  Questionable left bundle branch block however this is  chronic.   Assessment and Plan: * Hypotension due to hypovolemia We are working with her diagnosis of hypotension related to either bacteremia or due to infection of the wound of osteomyelitis.  However given the overall presentation of the patient I think we should consider other etiologies as well including progression of patient's previously known cardiomyopathy, venous thromboembolism, endocrine malfunction.  Therefore at this time I will check cortisol, ACTH, repeat echo, D-dimer and lower extremity Dopplers.  Patient wishes to be DNR/DNI, we will monitor vitals frequently.  At this time we see a good response to IV fluids.  Lactic acidosis [place] due to above, however will have to be very cautious if at all restarting metformin for the patient in the future  Hyperkalemia, diminished renal excretion Will give Lokelma now.  Check basic metabolic panel at midnight tonight.  Patient has AKI which is likely prerenal in the setting of hypovolemia, given association with patient's symptoms I do think patient had shock at initial presentation that is subacute.  Now improved.  Osteomyelitis (HCC) Right second DIP joint .  Patient already has an MRI of the site from early May.  I do not see any purulent discharge or spreading edema from the site.  Today.  This will likely need amputation of the digit.  I discussed with Dr. Lilian Kapur , Adam to request him to see the patient in the morning. He graciously agreed. Please follow up in the AM  Normocytic anemia Anemia workup ordered for tomorrow including serum electrophoresis profile    Addendum at 11:53 PM 08/22/2022 -patient home medication collection has been completed and reconciliation completed.  Please note that the patient is on dapagliflozin as well as on torsemide.  Both of these medications can cause volume depletion and dehydration.  This would explain patient presentation at this time.  And would be my principal consideration for patient  presentation.  I will continue to hold patient's losartan, beta-blocker at this time till blood pressure stabilizes.  Kindly review medication reconciliation in the morning.  Patient is also on doxycycline for over a month now, patient indicates that this is for her foot infection, at this time I will continue to hold it as patient has been started on different  antibiotics.  Acidosis is poorly responsive to IV fluids, given patient's nontoxic appearance, I think the lactic acidosis is related to metformin use.  We will continue to trend it and hold metformin __________________________________    Advance Care Planning:   Code Status: DNR patient, seemingly of good mind and senses advised me that she wishes to be DNR/DNI in case of cardiopulmonary arrest.  This is consistent with previously documented wishes.  Consults: Podiatry request has been put in for the morning  Family Communication: As per patient.  Patient's daughter is aware that the patient is hospitalized  Severity of Illness: The appropriate patient status for this patient is INPATIENT. Inpatient status is judged to be reasonable and necessary in order to provide the required intensity of service to ensure the patient's safety. The patient's presenting symptoms, physical exam findings, and initial radiographic and laboratory data in the context of their chronic comorbidities is felt to place them at high risk for further clinical deterioration. Furthermore, it is not anticipated that the patient will be medically stable for discharge from the hospital within 2 midnights of admission.   * I certify that at the point of admission it is my clinical judgment that the patient will require inpatient hospital care spanning beyond 2 midnights from the point of admission due to high intensity of service, high risk for further deterioration and high frequency of surveillance required.*  Author: Nolberto Hanlon, MD 08/22/2022 9:19 PM  For on call review  [email or web address].

## 2022-08-22 NOTE — Patient Instructions (Signed)
YOUR BLOOD PRESSURE WAS 87/39; RECOMMEND EMERGENCY CARE AT THIS TIME GIVEN ONGOING FATIGUE AND WEAKNESS AND LOW BLOOD PRESSURE

## 2022-08-22 NOTE — Code Documentation (Signed)
CODE SEPSIS - PHARMACY COMMUNICATION  **Broad Spectrum Antibiotics should be administered within 1 hour of Sepsis diagnosis**  Time Code Sepsis Called/Page Received: 2020  Antibiotics Ordered: vancomycin, Zosyn  Time of 1st antibiotic administration: 2052  Additional action taken by pharmacy:   If necessary, Name of Provider/Nurse Contacted:     Sharen Hones ,PharmD Clinical Pharmacist  08/22/2022  8:25 PM

## 2022-08-22 NOTE — ED Provider Notes (Signed)
[facility] Provider Note    Event Date/Time   First MD Initiated Contact with Patient 08/22/22 1750     (approximate)   History   Weakness and Hypotension   HPI  Kathryn [name] is a 89 y.o. female past medical history significant for HFrEF (35%), diabetes, hypertension, paroxysmal SVT, who presents to the emergency department with generalized weakness and fatigue.  Endorses progressively worsening generalized weakness and fatigue over the past couple of weeks.  Has been evaluated multiple times with primary care physician and wound clinic and found to have low blood pressures.  Patient states that she has not been feeling well over the past 2 weeks.  Multiple different doses of antibiotics for a wound of her right toe.  Not on antibiotics at this time.  Complaining of generalized weakness and fatigue, does not have an appetite.  Denies any nausea or vomiting.  No diarrhea.  Does endorse hemorrhoids but no significant melanotic stool.  No recent falls or head trauma.  No abdominal pain.  Does endorse recent change of urination.  Evaluated today at primary care physician office and told to come to the emergency department for further evaluation and workup.     Physical Exam   Triage Vital Signs: ED Triage Vitals  Enc Vitals Group     BP 08/22/22 1647 (!) 96/48     Pulse Rate 08/22/22 1647 75     Resp 08/22/22 1647 18     Temp 08/22/22 1647 97.8 F (36.6 C)     Temp src --      SpO2 08/22/22 1647 96 %     Weight 08/22/22 1648 180 lb (81.6 kg)     Height 08/22/22 1648 5\' 4"  (1.626 m)     Head Circumference --      Peak Flow --      Pain Score 08/22/22 1647 0     Pain Loc --      Pain Edu? --      Excl. in GC? --     Most recent vital signs: Vitals:   08/22/22 2030 08/22/22 2116  BP: (!) 127/34   Pulse: 76   Resp: 15   Temp:  98.4 F (36.9 C)  SpO2: 98%     Physical Exam Constitutional:      Appearance: She is well-developed. She is  ill-appearing.  HENT:     Head: Atraumatic.  Eyes:     Conjunctiva/sclera: Conjunctivae normal.  Cardiovascular:     Rate and Rhythm: Regular rhythm.  Pulmonary:     Effort: No respiratory distress.     Breath sounds: No wheezing.  Abdominal:     General: There is no distension.     Tenderness: There is no abdominal tenderness.  Musculoskeletal:        General: Normal range of motion.     Cervical back: Normal range of motion.     Right lower leg: No edema.     Left lower leg: No edema.     Comments: Right second toe with punctate area with purulent drainage.  No surrounding erythema warmth or induration.  +2 DP pulses that are equal bilaterally.  Skin:    General: Skin is warm.  Neurological:     Mental Status: She is alert. Mental status is at baseline.     IMPRESSION / MDM / ASSESSMENT AND PLAN / ED COURSE  I reviewed the triage vital signs and the nursing notes.  On chart review  patient has been followed by primary care and wound clinic and had multiple episodes of outpatient hypotension.  Patient does have a history of hypertension.  On arrival to the emergency department blood pressure 90/48.  Afebrile.  Differential diagnosis including osteomyelitis, cellulitis, failure to thrive, dehydration   RADIOLOGY I independently reviewed imaging, my interpretation of imaging: X-ray of the right second toe with findings concerning for possible osteomyelitis.  Read as acute fracture.  Called and discussed with the radiologist given that she has not had any falls or trauma, possible osteomyelitis.  LABS (all labs ordered are listed, but only abnormal results are displayed) Labs interpreted as -    Labs Reviewed  BASIC METABOLIC PANEL - Abnormal; Notable for the following components:      Result Value   Potassium 5.2 (*)    Glucose, Bld 141 (*)    BUN 66 (*)    Creatinine, Ser 1.53 (*)    GFR, Estimated 33 (*)    All other components within normal limits  CBC -  Abnormal; Notable for the following components:   Hemoglobin 8.7 (*)    HCT 32.9 (*)    MCH 21.2 (*)    MCHC 26.4 (*)    RDW 21.5 (*)    All other components within normal limits  URINALYSIS, ROUTINE W REFLEX MICROSCOPIC - Abnormal; Notable for the following components:   Color, Urine YELLOW (*)    APPearance HAZY (*)    Glucose, UA 50 (*)    All other components within normal limits  HEPATIC FUNCTION PANEL - Abnormal; Notable for the following components:   Total Protein 6.2 (*)    Albumin 3.0 (*)    All other components within normal limits  LIPASE, BLOOD - Abnormal; Notable for the following components:   Lipase 54 (*)    All other components within normal limits  LACTIC ACID, PLASMA - Abnormal; Notable for the following components:   Lactic Acid, Venous 3.1 (*)    All other components within normal limits  LACTIC ACID, PLASMA - Abnormal; Notable for the following components:   Lactic Acid, Venous 3.2 (*)    All other components within normal limits  BRAIN NATRIURETIC PEPTIDE - Abnormal; Notable for the following components:   B Natriuretic Peptide 123.7 (*)    All other components within normal limits  SEDIMENTATION RATE - Abnormal; Notable for the following components:   Sed Rate 60 (*)    All other components within normal limits  D-DIMER, QUANTITATIVE - Abnormal; Notable for the following components:   D-Dimer, Quant 0.87 (*)    All other components within normal limits  CBG MONITORING, ED - Abnormal; Notable for the following components:   Glucose-Capillary 137 (*)    All other components within normal limits  CULTURE, BLOOD (SINGLE)  CULTURE, BLOOD (SINGLE)  C-REACTIVE PROTEIN  CORTISOL  ACTH  THYROID PANEL WITH TSH  CORTISOL-AM, BLOOD  BASIC METABOLIC PANEL  BASIC METABOLIC PANEL  VITAMIN B12  FOLATE  IRON AND TIBC  FERRITIN  RETICULOCYTES  PROTEIN ELECTROPHORESIS, SERUM  TROPONIN I (HIGH SENSITIVITY)     MDM  Patient with anemia but hemoglobin is  stable at 8.7 with a baseline of 8.  Does not meet criteria for blood transfusion.  Normal platelets and no leukocytosis.  Does have findings concerning for a mild acute kidney injury with a creatinine elevated to 1.5, appears prerenal with a BUN of 66.  Potassium elevated chronically at 5.2.  No signs of urinary tract  infection.  Felt that 30 cc/kg of IV fluids may be detrimental to the patient given her normal blood pressure, given 500 bolus of IV fluids.  Given concern for possible infection to the right second toe we will obtain an x-ray of the toe.  Added on ESR and CRP.  Will obtain a blood culture and lactic acid.  X-ray imaging concerning for possible osteomyelitis.  Given her elevated lactic acid and soft blood pressures as an outpatient we will go ahead and cover the patient with antibiotics.  Started on antibiotics to cover for MRSA and Pseudomonas.  Given Zosyn and vancomycin.  Consulted hospitalist for admission.     PROCEDURES:  Critical Care performed: yes  .Critical Care  Performed by: Corena Herter, MD Authorized by: Corena Herter, MD   Critical care provider statement:    Critical care time (minutes):  45   Critical care time was exclusive of:  Separately billable procedures and treating other patients   Critical care was necessary to treat or prevent imminent or life-threatening deterioration of the following conditions:  Circulatory failure and sepsis   Critical care was time spent personally by me on the following activities:  Development of treatment plan with patient or surrogate, discussions with consultants, evaluation of patient's response to treatment, examination of patient, ordering and review of laboratory studies, ordering and review of radiographic studies, ordering and performing treatments and interventions, pulse oximetry, re-evaluation of patient's condition and review of old charts   Patient's presentation is most consistent with acute presentation with  potential threat to life or bodily function.   MEDICATIONS ORDERED IN ED: Medications  lactated ringers infusion ( Intravenous New Bag/Given 08/22/22 2130)  vancomycin (VANCOREADY) IVPB 1750 mg/350 mL (1,750 mg Intravenous New Bag/Given 08/22/22 2131)  sodium zirconium cyclosilicate (LOKELMA) packet 10 g (has no administration in time range)  vancomycin variable dose per unstable renal function (pharmacist dosing) (has no administration in time range)  piperacillin-tazobactam (ZOSYN) IVPB 3.375 g (has no administration in time range)  sodium chloride 0.9 % bolus 500 mL (0 mLs Intravenous Stopped 08/22/22 2044)  piperacillin-tazobactam (ZOSYN) IVPB 3.375 g (0 g Intravenous Stopped 08/22/22 2125)    FINAL CLINICAL IMPRESSION(S) / ED DIAGNOSES   Final diagnoses:  Dehydration  Weakness  AKI (acute kidney injury) (HCC)  Foot infection     Rx / DC Orders   ED Discharge Orders     None        Note:  This document was prepared using Dragon voice recognition software and may include unintentional dictation errors.   Corena Herter, MD 08/22/22 2241

## 2022-08-22 NOTE — Assessment & Plan Note (Signed)
Pt notes "I can't keep going like this" unable to walk, stand for weight Generalized moaning of complaints and ill feelings. Recommend ER at this time given extensive medical history and acute change in hemodynamics.

## 2022-08-22 NOTE — Progress Notes (Signed)
Notified provider of need to order repeat lactic acid.  Orders placed.

## 2022-08-22 NOTE — Assessment & Plan Note (Signed)
Will give Lokelma now.  Check basic metabolic panel at midnight tonight.  Patient has AKI which is likely prerenal in the setting of hypovolemia, given association with patient's symptoms I do think patient had shock at initial presentation that is subacute.  Now improved.

## 2022-08-22 NOTE — Progress Notes (Signed)
Elink monitoring for the code sepsis protocol.  

## 2022-08-22 NOTE — ED Notes (Signed)
Patient resting in bed, patient given [place] sandwich box and ginger ale.

## 2022-08-22 NOTE — Consult Note (Signed)
Pharmacy Antibiotic Note  Kathryn [name] is a 89 y.o. female admitted on 08/22/2022 with weakness and hypotension x 2 weeks. Patient with wound on right toe which she follows outpatient wound clinic and has recently been on multiple antibiotics with most recent wound clinic note 07/31/22 stating ADD cipro + flagyl to previous regimen of doxycycline.  Toe X ray c/f acute osteomyelitis. Pharmacy has been consulted for vancomycin and zosyn dosing.  Plan: Zosyn 3.375g IV q8h (4 hour infusion). Vancomycin 1750 mg LD x 1 F/u with renal function in AM as appears patient with AKI   Height: 5\' 4"  (162.6 cm) Weight: 81.6 kg (180 lb) IBW/kg (Calculated) : 54.7  Temp (24hrs), Avg:98.1 F (36.7 C), Min:97.8 F (36.6 C), Max:98.4 F (36.9 C)  Recent Labs  Lab 08/22/22 1652 08/22/22 1830  WBC 9.8  --   CREATININE 1.53*  --   LATICACIDVEN  --  3.1*    Estimated Creatinine Clearance: 27.3 mL/min (A) (by C-G formula based on SCr of 1.53 mg/dL (H)).    Allergies  Allergen Reactions   Oxycodone Other (See Comments)    Mental status change   Prednisone     swelling, bruising.    Antimicrobials this admission: 5/29 Zosyn >>  5/29 Vancomycin >>   Dose adjustments this admission:   Microbiology results: 5/29 BCx: sent  Thank you for allowing pharmacy to be a part of this patient's care.  Sharen Hones, PharmD, BCPS Clinical Pharmacist   08/22/2022 9:26 PM

## 2022-08-22 NOTE — ED Triage Notes (Signed)
Son states pt has been getting confused lately and "seems to be depressed":

## 2022-08-22 NOTE — Progress Notes (Signed)
Established patient visit   Patient: Kathryn [name]   DOB: [birth year]   89 y.o. Female  MRN: [id number] Visit Date: 08/22/2022  Today's healthcare provider: Jacky Kindle, FNP   Chief Complaint  Patient presents with   Fatigue    Fatigue, leg pain, no appetite, hemorrhage, hard to sit --3 weeks      Subjective    HPI HPI     Fatigue    Additional comments: Fatigue, leg pain, no appetite, hemorrhage, hard to sit --3 weeks         Last edited by Shelly Bombard, CMA on 08/22/2022  3:58 PM.      Medications: Outpatient Medications Prior to Visit  Medication Sig   acetaminophen (TYLENOL) 500 MG tablet Take 1,000 mg by mouth every 6 (six) hours as needed. Pain    albuterol (PROVENTIL) (2.5 MG/3ML) 0.083% nebulizer solution USE 1 VIAL VIA NEBULIZER EVERY 6 HOURS AS NEEDED FOR WHEEZING OR SHORTNESS OF BREATH   amiodarone (PACERONE) 200 MG tablet Take 0.5 tablets (100 mg total) by mouth daily.   aspirin 81 MG EC tablet Take 81 mg by mouth daily. Swallow whole.   atorvastatin (LIPITOR) 40 MG tablet TAKE 1 TABLET(40 MG) BY MOUTH DAILY   carvedilol (COREG) 6.25 MG tablet TAKE 1 TABLET(6.25 MG) BY MOUTH TWICE DAILY WITH A MEAL   dapagliflozin propanediol (FARXIGA) 10 MG TABS tablet TAKE 1 TABLET(10 MG) BY MOUTH DAILY   doxycycline (VIBRAMYCIN) 100 MG capsule Take 100 mg by mouth 2 (two) times daily.   gabapentin (NEURONTIN) 400 MG capsule Take 1 capsule (400 mg total) by mouth 4 (four) times daily.   glucose blood (CONTOUR NEXT TEST) test strip Check sugar once daily  DX E11.9   hydrOXYzine (ATARAX) 10 MG tablet TAKE 1 TO 2 TABLETS(10 TO 20 MG) BY MOUTH EVERY 6 HOURS AS NEEDED   levothyroxine (SYNTHROID) 100 MCG tablet TAKE 1 TABLET(100 MCG) BY MOUTH DAILY BEFORE BREAKFAST   losartan (COZAAR) 25 MG tablet Take 1 tablet (25 mg total) by mouth every evening.   metFORMIN (GLUCOPHAGE) 1000 MG tablet TAKE 1 TABLET(1000 MG) BY MOUTH DAILY WITH SUPPER   omeprazole (PRILOSEC) 20 MG  capsule TAKE 1 CAPSULE(20 MG) BY MOUTH DAILY   SPIRIVA HANDIHALER 18 MCG inhalation capsule PLACE 1 CAPSULE INTO INHALER AND INHALE EVERY DAY AS NEEDED   torsemide (DEMADEX) 20 MG tablet Take 40 mg by mouth daily.   [DISCONTINUED] metroNIDAZOLE (FLAGYL) 500 MG tablet Take 500 mg by mouth 2 (two) times daily.   No facility-administered medications prior to visit.    Review of Systems    Objective    BP (!) 84/48   Pulse 87   Temp 98 F (36.7 C)   SpO2 99%   Physical Exam Vitals and nursing note reviewed.  Constitutional:      General: She is not in acute distress.    Appearance: Normal appearance. She is overweight. She is ill-appearing. She is not toxic-appearing or diaphoretic.  HENT:     Head: Normocephalic and atraumatic.  Cardiovascular:     Rate and Rhythm: Normal rate and regular rhythm.     Pulses: Normal pulses.     Heart sounds: Murmur heard.     Friction rub present. No gallop.  Pulmonary:     Effort: Pulmonary effort is normal. No respiratory distress.     Breath sounds: Normal breath sounds. No stridor. No wheezing, rhonchi or rales.  Chest:  Chest wall: No tenderness.  Abdominal:     General: Bowel sounds are normal. There is distension.     Palpations: Abdomen is soft.     Tenderness: There is abdominal tenderness.  Musculoskeletal:        General: No swelling, tenderness, deformity or signs of injury.     Right lower leg: No edema.     Left lower leg: No edema.     Comments: Unable to walk d/t BLE weakness   Skin:    General: Skin is warm and dry.     Capillary Refill: Capillary refill takes less than 2 seconds.     Coloration: Skin is not jaundiced or pale.     Findings: No bruising, erythema, lesion or rash.     Comments: Tenting skin to posterior hands   Neurological:     General: No focal deficit present.     Mental Status: She is alert and oriented to person, place, and time. Mental status is at baseline.     Cranial Nerves: No cranial nerve  deficit.     Sensory: No sensory deficit.     Motor: No weakness.     Coordination: Coordination normal.  Psychiatric:        Mood and Affect: Mood normal.        Behavior: Behavior normal.        Thought Content: Thought content normal.        Judgment: Judgment normal.     No results found for any visits on 08/22/22.  Assessment & Plan     Problem List Items Addressed This Visit       Cardiovascular and Mediastinum   HFrEF (heart failure with reduced ejection fraction) (HCC)    Concern for CHF exacerbation with abdominal fluid retention c/f possible decrease gut perfusion which is giving her generalized abdominal complaints and exacerbating hypotension with fluid retention in belly. No LE edema. Defer labs- recommend ER at this time       Hypotension due to hypovolemia - Primary    Acute, symptomatic Endorses good PO intake; however, tenting skin and consistent hypotension noted on exam Recommend ER at this time         Other   FTT (failure to thrive) in adult    Pt notes "I can't keep going like this" unable to walk, stand for weight Generalized moaning of complaints and ill feelings. Recommend ER at this time given extensive medical history and acute change in hemodynamics.       Seek emergent care; family brought patient in w/c- they verbalized they will take her to ER at this time and do not need EMS escort.    Leilani Merl, FNP, have reviewed all documentation for this visit. The documentation on 08/22/22 for the exam, diagnosis, procedures, and orders are all accurate and complete.  Jacky Kindle, FNP  [facility] [phone] (phone) [phone] (fax)  [facility]

## 2022-08-22 NOTE — Assessment & Plan Note (Signed)
Concern for CHF exacerbation with abdominal fluid retention c/f possible decrease gut perfusion which is giving her generalized abdominal complaints and exacerbating hypotension with fluid retention in belly. No LE edema. Defer labs- recommend ER at this time

## 2022-08-22 NOTE — Assessment & Plan Note (Addendum)
We are working with her diagnosis of hypotension related to either bacteremia or due to infection of the wound of osteomyelitis.  However given the overall presentation of the patient I think we should consider other etiologies as well including progression of patient's previously known cardiomyopathy, venous thromboembolism, endocrine malfunction.  Therefore at this time I will check cortisol, ACTH, repeat echo, D-dimer and lower extremity Dopplers.  Patient wishes to be DNR/DNI, we will monitor vitals frequently.  At this time we see a good response to IV fluids.

## 2022-08-23 ENCOUNTER — Inpatient Hospital Stay (HOSPITAL_COMMUNITY)
Admit: 2022-08-23 | Discharge: 2022-08-23 | Disposition: A | Discharge status: Home / Self Care | Payer: Medicare HMO | Attending: Internal Medicine | Admitting: Internal Medicine

## 2022-08-23 ENCOUNTER — Ambulatory Visit: Payer: Medicare HMO | Admitting: Physician Assistant

## 2022-08-23 ENCOUNTER — Inpatient Hospital Stay: Payer: Medicare HMO

## 2022-08-23 DIAGNOSIS — E872 Acidosis, unspecified: Secondary | ICD-10-CM

## 2022-08-23 DIAGNOSIS — R531 Weakness: Principal | ICD-10-CM

## 2022-08-23 DIAGNOSIS — R630 Anorexia: Secondary | ICD-10-CM

## 2022-08-23 DIAGNOSIS — I10 Essential (primary) hypertension: Secondary | ICD-10-CM

## 2022-08-23 DIAGNOSIS — E860 Dehydration: Secondary | ICD-10-CM

## 2022-08-23 DIAGNOSIS — I428 Other cardiomyopathies: Secondary | ICD-10-CM

## 2022-08-23 DIAGNOSIS — R700 Elevated erythrocyte sedimentation rate: Secondary | ICD-10-CM

## 2022-08-23 DIAGNOSIS — Z87891 Personal history of nicotine dependence: Secondary | ICD-10-CM

## 2022-08-23 DIAGNOSIS — M869 Osteomyelitis, unspecified: Secondary | ICD-10-CM | POA: Diagnosis not present

## 2022-08-23 DIAGNOSIS — E119 Type 2 diabetes mellitus without complications: Secondary | ICD-10-CM

## 2022-08-23 DIAGNOSIS — R5383 Other fatigue: Secondary | ICD-10-CM

## 2022-08-23 DIAGNOSIS — R400 Somnolence: Secondary | ICD-10-CM | POA: Diagnosis not present

## 2022-08-23 DIAGNOSIS — I959 Hypotension, unspecified: Secondary | ICD-10-CM

## 2022-08-23 DIAGNOSIS — L089 Local infection of the skin and subcutaneous tissue, unspecified: Secondary | ICD-10-CM

## 2022-08-23 DIAGNOSIS — D649 Anemia, unspecified: Secondary | ICD-10-CM

## 2022-08-23 DIAGNOSIS — E875 Hyperkalemia: Secondary | ICD-10-CM

## 2022-08-23 DIAGNOSIS — E861 Hypovolemia: Secondary | ICD-10-CM | POA: Diagnosis not present

## 2022-08-23 DIAGNOSIS — E039 Hypothyroidism, unspecified: Secondary | ICD-10-CM

## 2022-08-23 LAB — CREATININE, SERUM
Creatinine, Ser: 1.4 mg/dL — ABNORMAL HIGH (ref 0.44–1.00)
GFR, Estimated: 37 mL/min — ABNORMAL LOW (ref >60)

## 2022-08-23 LAB — CBC
HCT: 27.6 % — ABNORMAL LOW (ref 36.0–46.0)
HCT: 28.3 % — ABNORMAL LOW (ref 36.0–46.0)
HCT: 35.2 % — ABNORMAL LOW (ref 36.0–46.0)
Hemoglobin: 7.6 g/dL — ABNORMAL LOW (ref 12.0–15.0)
Hemoglobin: 7.8 g/dL — ABNORMAL LOW (ref 12.0–15.0)
Hemoglobin: 9.4 g/dL — ABNORMAL LOW (ref 12.0–15.0)
MCH: 21.2 pg — ABNORMAL LOW (ref 26.0–34.0)
MCH: 21.5 pg — ABNORMAL LOW (ref 26.0–34.0)
MCH: 21.6 pg — ABNORMAL LOW (ref 26.0–34.0)
MCHC: 26.7 g/dL — ABNORMAL LOW (ref 30.0–36.0)
MCHC: 27.5 g/dL — ABNORMAL LOW (ref 30.0–36.0)
MCHC: 27.6 g/dL — ABNORMAL LOW (ref 30.0–36.0)
MCV: 78 fL — ABNORMAL LOW (ref 80.0–100.0)
MCV: 78.4 fL — ABNORMAL LOW (ref 80.0–100.0)
MCV: 79.3 fL — ABNORMAL LOW (ref 80.0–100.0)
Platelets: 219 10*3/uL (ref 150–400)
Platelets: 233 10*3/uL (ref 150–400)
Platelets: 303 10*3/uL (ref 150–400)
RBC: 3.54 MIL/uL — ABNORMAL LOW (ref 3.87–5.11)
RBC: 3.61 MIL/uL — ABNORMAL LOW (ref 3.87–5.11)
RBC: 4.44 MIL/uL (ref 3.87–5.11)
RDW: 21.1 % — ABNORMAL HIGH (ref 11.5–15.5)
RDW: 21.1 % — ABNORMAL HIGH (ref 11.5–15.5)
RDW: 21.3 % — ABNORMAL HIGH (ref 11.5–15.5)
WBC: 13.6 10*3/uL — ABNORMAL HIGH (ref 4.0–10.5)
WBC: 15.5 10*3/uL — ABNORMAL HIGH (ref 4.0–10.5)
WBC: 4.3 10*3/uL (ref 4.0–10.5)
nRBC: 0 % (ref 0.0–0.2)
nRBC: 0.2 % (ref 0.0–0.2)
nRBC: 0.5 % — ABNORMAL HIGH (ref 0.0–0.2)

## 2022-08-23 LAB — RETICULOCYTES
Immature Retic Fract: 34.7 % — ABNORMAL HIGH (ref 2.3–15.9)
RBC.: 3.56 MIL/uL — ABNORMAL LOW (ref 3.87–5.11)
Retic Count, Absolute: 75.5 10*3/uL (ref 19.0–186.0)
Retic Ct Pct: 2.1 % (ref 0.4–3.1)

## 2022-08-23 LAB — ECHOCARDIOGRAM COMPLETE
AR max vel: 2.66 cm2
AV Area VTI: 2.87 cm2
AV Area mean vel: 2.43 cm2
AV Mean grad: 3 mmHg
AV Peak grad: 6.2 mmHg
Ao pk vel: 1.24 m/s
Area-P 1/2: 3.34 cm2
Calc EF: 43.3 %
Height: 64 in
MV VTI: 2.37 cm2
S' Lateral: 2.8 cm
Single Plane A2C EF: 48 %
Single Plane A4C EF: 34.9 %
Weight: 3001.78 oz

## 2022-08-23 LAB — FOLATE: Folate: 13.2 ng/mL (ref >5.9)

## 2022-08-23 LAB — GLUCOSE, CAPILLARY
Glucose-Capillary: 108 mg/dL — ABNORMAL HIGH (ref 70–99)
Glucose-Capillary: 123 mg/dL — ABNORMAL HIGH (ref 70–99)
Glucose-Capillary: 134 mg/dL — ABNORMAL HIGH (ref 70–99)
Glucose-Capillary: 159 mg/dL — ABNORMAL HIGH (ref 70–99)
Glucose-Capillary: 74 mg/dL (ref 70–99)

## 2022-08-23 LAB — BASIC METABOLIC PANEL
Anion gap: 10 (ref 5–15)
Anion gap: 8 (ref 5–15)
BUN: 52 mg/dL — ABNORMAL HIGH (ref 8–23)
BUN: 61 mg/dL — ABNORMAL HIGH (ref 8–23)
CO2: 22 mmol/L (ref 22–32)
CO2: 23 mmol/L (ref 22–32)
Calcium: 7.9 mg/dL — ABNORMAL LOW (ref 8.9–10.3)
Calcium: 8.6 mg/dL — ABNORMAL LOW (ref 8.9–10.3)
Chloride: 106 mmol/L (ref 98–111)
Chloride: 108 mmol/L (ref 98–111)
Creatinine, Ser: 1.47 mg/dL — ABNORMAL HIGH (ref 0.44–1.00)
Creatinine, Ser: 1.51 mg/dL — ABNORMAL HIGH (ref 0.44–1.00)
GFR, Estimated: 33 mL/min — ABNORMAL LOW (ref >60)
GFR, Estimated: 35 mL/min — ABNORMAL LOW (ref >60)
Glucose, Bld: 136 mg/dL — ABNORMAL HIGH (ref 70–99)
Glucose, Bld: 224 mg/dL — ABNORMAL HIGH (ref 70–99)
Potassium: 4.2 mmol/L (ref 3.5–5.1)
Potassium: 4.4 mmol/L (ref 3.5–5.1)
Sodium: 138 mmol/L (ref 135–145)
Sodium: 139 mmol/L (ref 135–145)

## 2022-08-23 LAB — CULTURE, BLOOD (SINGLE)

## 2022-08-23 LAB — CORTISOL-AM, BLOOD: Cortisol - AM: 19.7 ug/dL (ref 6.7–22.6)

## 2022-08-23 LAB — LACTIC ACID, PLASMA
Lactic Acid, Venous: 1.8 mmol/L (ref 0.5–1.9)
Lactic Acid, Venous: 4.9 mmol/L (ref 0.5–1.9)

## 2022-08-23 LAB — IRON AND TIBC
Iron: 33 ug/dL (ref 28–170)
Saturation Ratios: 13 % (ref 10.4–31.8)
TIBC: 246 ug/dL — ABNORMAL LOW (ref 250–450)
UIBC: 213 ug/dL

## 2022-08-23 LAB — HEMOGLOBIN A1C
Hgb A1c MFr Bld: 6.9 % — ABNORMAL HIGH (ref 4.8–5.6)
Mean Plasma Glucose: 151 mg/dL

## 2022-08-23 LAB — VITAMIN B12: Vitamin B-12: 120 pg/mL — ABNORMAL LOW (ref 180–914)

## 2022-08-23 LAB — PROTIME-INR
INR: 1.1 (ref 0.8–1.2)
Prothrombin Time: 14.5 seconds (ref 11.4–15.2)

## 2022-08-23 LAB — C-REACTIVE PROTEIN: CRP: <0.5 mg/dL (ref <1.0)

## 2022-08-23 LAB — APTT: aPTT: 28 seconds (ref 24–36)

## 2022-08-23 LAB — FERRITIN: Ferritin: 14 ng/mL (ref 11–307)

## 2022-08-23 LAB — CORTISOL: Cortisol, Plasma: 9.7 ug/dL

## 2022-08-23 MED ORDER — DARBEPOETIN ALFA 40 MCG/0.4ML IJ SOSY
25.0000 ug | PREFILLED_SYRINGE | INTRAMUSCULAR | Status: DC
  Administered 2022-08-23: 25 ug via SUBCUTANEOUS
  Filled 2022-08-23: qty 0.42

## 2022-08-23 MED ORDER — CARVEDILOL 6.25 MG PO TABS
6.2500 mg | ORAL_TABLET | Freq: Two times a day (BID) | ORAL | Status: DC
  Administered 2022-08-23 – 2022-08-30 (×15): 6.25 mg via ORAL
  Filled 2022-08-23 (×16): qty 1

## 2022-08-23 MED ORDER — HYDROXYZINE HCL 10 MG PO TABS
10.0000 mg | ORAL_TABLET | Freq: Three times a day (TID) | ORAL | Status: DC | PRN
  Administered 2022-08-23 – 2022-08-27 (×2): 10 mg via ORAL
  Filled 2022-08-23 (×4): qty 1

## 2022-08-23 MED ORDER — PANTOPRAZOLE SODIUM 40 MG PO TBEC
40.0000 mg | DELAYED_RELEASE_TABLET | Freq: Every day | ORAL | Status: DC
  Administered 2022-08-23 – 2022-08-31 (×9): 40 mg via ORAL
  Filled 2022-08-23 (×9): qty 1

## 2022-08-23 MED ORDER — SODIUM CHLORIDE 0.9 % IV BOLUS
1000.0000 mL | Freq: Once | INTRAVENOUS | Status: AC
  Administered 2022-08-23: 1000 mL via INTRAVENOUS

## 2022-08-23 MED ORDER — ATORVASTATIN CALCIUM 20 MG PO TABS
40.0000 mg | ORAL_TABLET | Freq: Every day | ORAL | Status: DC
  Administered 2022-08-23 – 2022-08-30 (×8): 40 mg via ORAL
  Filled 2022-08-23 (×8): qty 2

## 2022-08-23 MED ORDER — ALBUTEROL SULFATE (2.5 MG/3ML) 0.083% IN NEBU: 2.5000 mg | INHALATION_SOLUTION | RESPIRATORY_TRACT | Status: DC | PRN

## 2022-08-23 MED ORDER — INSULIN ASPART 100 UNIT/ML IJ SOLN
0.0000 [IU] | Freq: Three times a day (TID) | INTRAMUSCULAR | Status: DC
  Administered 2022-08-23 – 2022-08-25 (×2): 2 [IU] via SUBCUTANEOUS
  Administered 2022-08-26 – 2022-08-27 (×2): 1 [IU] via SUBCUTANEOUS
  Administered 2022-08-27: 2 [IU] via SUBCUTANEOUS
  Administered 2022-08-28 – 2022-08-29 (×4): 1 [IU] via SUBCUTANEOUS
  Administered 2022-08-30: 2 [IU] via SUBCUTANEOUS
  Administered 2022-08-30 – 2022-08-31 (×2): 1 [IU] via SUBCUTANEOUS
  Filled 2022-08-23 (×14): qty 1

## 2022-08-23 MED ORDER — TIOTROPIUM BROMIDE MONOHYDRATE 18 MCG IN CAPS
18.0000 ug | ORAL_CAPSULE | Freq: Every day | RESPIRATORY_TRACT | Status: DC
  Administered 2022-08-23 – 2022-08-31 (×8): 18 ug via RESPIRATORY_TRACT
  Filled 2022-08-23 (×2): qty 5

## 2022-08-23 MED ORDER — GABAPENTIN 400 MG PO CAPS
400.0000 mg | ORAL_CAPSULE | Freq: Three times a day (TID) | ORAL | Status: DC
  Administered 2022-08-23 – 2022-08-31 (×26): 400 mg via ORAL
  Filled 2022-08-23 (×26): qty 1

## 2022-08-23 MED ORDER — AMIODARONE HCL 200 MG PO TABS
100.0000 mg | ORAL_TABLET | Freq: Every day | ORAL | Status: DC
  Administered 2022-08-23 – 2022-08-31 (×9): 100 mg via ORAL
  Filled 2022-08-23 (×9): qty 1

## 2022-08-23 MED ORDER — ONDANSETRON HCL 4 MG/2ML IJ SOLN
4.0000 mg | INTRAMUSCULAR | Status: DC | PRN
  Administered 2022-08-23: 4 mg via INTRAVENOUS
  Filled 2022-08-23: qty 2

## 2022-08-23 MED ORDER — INSULIN ASPART 100 UNIT/ML IJ SOLN: 0.0000 [IU] | Freq: Every day | INTRAMUSCULAR | Status: DC

## 2022-08-23 MED ORDER — CYANOCOBALAMIN 1000 MCG/ML IJ SOLN
1000.0000 ug | Freq: Every day | INTRAMUSCULAR | Status: AC
  Administered 2022-08-23 – 2022-08-29 (×6): 1000 ug via INTRAMUSCULAR
  Filled 2022-08-23 (×7): qty 1

## 2022-08-23 MED ORDER — ASPIRIN 81 MG PO TBEC
81.0000 mg | DELAYED_RELEASE_TABLET | Freq: Every day | ORAL | Status: DC
  Administered 2022-08-23: 81 mg via ORAL
  Filled 2022-08-23: qty 1

## 2022-08-23 MED ORDER — VANCOMYCIN HCL IN DEXTROSE 1-5 GM/200ML-% IV SOLN: 1000.0000 mg | INTRAVENOUS | Status: DC

## 2022-08-23 MED ORDER — IOHEXOL 9 MG/ML PO SOLN
500.0000 mL | ORAL | Status: AC
  Administered 2022-08-23: 500 mL via ORAL

## 2022-08-23 MED ORDER — LEVOTHYROXINE SODIUM 100 MCG PO TABS
100.0000 ug | ORAL_TABLET | Freq: Every day | ORAL | Status: DC
  Administered 2022-08-23 – 2022-08-31 (×9): 100 ug via ORAL
  Filled 2022-08-23 (×9): qty 1

## 2022-08-23 MED ORDER — TRAMADOL HCL 50 MG PO TABS
50.0000 mg | ORAL_TABLET | Freq: Four times a day (QID) | ORAL | Status: DC | PRN
  Administered 2022-08-23 – 2022-08-27 (×4): 50 mg via ORAL
  Filled 2022-08-23 (×4): qty 1

## 2022-08-23 NOTE — Assessment & Plan Note (Signed)
Patient with slowly decreasing p.o. intake and increased somnolence. -Will get benefit from palliative care

## 2022-08-23 NOTE — Consult Note (Signed)
  Subjective:  Patient ID: Kathryn [name], female    DOB: 1933-11-06,  MRN: [id number]  A 89 y.o. female with past medical history of osteomyelitis to the right second digit.  Patient was being treated at the wound care center now here for continuous pain to the toe as well as the legs.  She wanted to get it evaluated.  She denies any other acute complaints.  She states today that she is not able to sleep well because of pain from the leg.  She has not seen a podiatrist. Objective:   Vitals:   08/23/22 0421 08/23/22 0428  BP: (!) 107/49 (!) 101/45  Pulse: 99 (!) 104  Resp: 18 20  Temp: 98.2 F (36.8 C) 98.3 F (36.8 C)  SpO2: 90% 96%   General AA&O x3. Normal mood and affect.  Vascular Dorsalis pedis and posterior tibial pulses nonpalpable Brisk capillary brisk o all digits. Pedal hair present.  Neurologic Epicritic sensation grossly intact.  Dermatologic Right second digit/toe ulceration with osteomyelitis.  PIPJ wound noted probing down to bone no purulent drainage noted no redness noted  Orthopedic: MMT 5/5 in dorsiflexion, plantarflexion, inversion, and eversion. Normal joint ROM without pain or crepitus.    Assessment & Plan:  Patient was evaluated and treated and all questions answered.  Right second PIPJ ulceration probing down to bone without clinical signs of infection -All questions and concerns were discussed with the patient in extensive detail -Outpatient MRI of foot was reviewed which shows osteomyelitis of the second digit.  Patient refuses amputation of the second toe -Patient may benefit from vascular workup with new ABIs and vascular surgery consult. -No podiatric surgery planned at this time. -Patient may need long-term antibiotics due to refusal of amputation -Podiatry to sign off please reconsult [place] as needed  Candelaria Stagers, DPM  Accessible via secure chat for questions or concerns.

## 2022-08-23 NOTE — Assessment & Plan Note (Signed)
-   Continue home Protonix 

## 2022-08-23 NOTE — H&P (View-Only) (Signed)
Hospital Consult    Reason for Consult:  Right Lower extremity toe Osteomyelitis Requesting Physician:  Dr Sumayya Amin MD MRN #:  [id number]  History of Present Illness: This is a 88 y.o. female Kathryn [name] is a 88 y.o. female with medical history significant of known osteomyelitis of the right second toe based on MRI that is in the system from early May of this year.  Patient has been following wound care, worsening fatigue, loss of appetite and sleeping most of the time so she was evaluated at outpatient clinic and found to be hypotensive, she was advised to come to ER.   Upon further workup here at [facility] podiatry was consulted for osteomyelitis of the patient's second toe of her right lower extremity.  Dr. McDonald request vascular surgery to attempt a right lower extremity angiogram to evaluate blood flow before attempting any amputation.  On exam this afternoon patient is resting comfortably in bed after getting some pain medicine other than Tylenol.  Prior she has endorsed pain 7 out of 10 to her right foot and toes.  No other complaints.  Vitals all remained stable.  Past Medical History:  Diagnosis Date   Actinic keratosis    Aortic atherosclerosis (HCC) 05/25/2020   Arthritis    spinal stenosis   Cardiomyopathy - presumed to be nonischemic    a. 03/2020 Echo: EF 40-45%, gr2 DD. Nl RV fxn. Mildly dil LA. Mild MR/ao sclerosis; b. 03/2020 MV: EF 30-44%, no ischemia. Cor Ca2+ in pLAD.   CHF (congestive heart failure) (HCC)    COPD (chronic obstructive pulmonary disease) (HCC)    Coronary artery calcification seen on CT scan    a. 03/2020 MV: CT attenuation images show Ao and mod pLAD Ca2+.   Diabetes mellitus without complication (HCC)    Gastric ulcer 06/2010   treated with Prolisec- states no problems now   Hyperlipidemia    Hypertension    PCP Dr Richard Gilbert   [place]   Hypothyroidism    Left bundle branch block (LBBB)    Neuromuscular disorder (HCC)    slight  numbness right toes- comes and goes   Peripheral vascular disease (HCC)    varicose veins left leg   Pneumonia    PSVT (paroxysmal supraventricular tachycardia)    a. 03/2020 Zio: Avg HR 87 (66-211).  4 beats NSVT.  43 PSVT episodes, longest 10h 35m @ avg of 153 bpm (max 211).  Seen by EP-->amio started (pt wished to avoid EPS/RFCA).   Seasonal allergies    Shortness of breath    Skin cancer    multiple from face   Squamous cell carcinoma of skin 03/23/2013   L dorsal hand   Squamous cell carcinoma of skin 02/04/2014   R forearm/in situ, L pretibial   Squamous cell carcinoma of skin 09/24/2018   R thumb   Squamous cell carcinoma of skin 09/08/2019   L forearm - ED&C   Squamous cell carcinoma of skin 09/08/2019   R dosal hand - ED&C   Squamous cell carcinoma of skin 06/20/2021   R cheek preauricular, excised    Past Surgical History:  Procedure Laterality Date   BACK SURGERY     4/12  Dr Aplington- for lumbar stenosis   BREAST CYST ASPIRATION Left 1965   negative   BREAST SURGERY  1962   lumpectomy with biopsy   left   CARPAL TUNNEL RELEASE     right   COLONOSCOPY     COLONOSCOPY WITH   PROPOFOL N/A 06/30/2015   Procedure: COLONOSCOPY WITH PROPOFOL;  Surgeon: Paul Y Oh, MD;  Location: [facility];  Service: Gastroenterology;  Laterality: N/A;   COLONOSCOPY WITH PROPOFOL N/A 01/22/2017   Procedure: COLONOSCOPY WITH PROPOFOL;  Surgeon: Skulskie, Martin U, MD;  Location: [facility] END[facility]COPY;  Service: Endoscopy;  Laterality: N/A;   COLONOSCOPY WITH PROPOFOL N/A 01/28/2019   Procedure: COLONOSCOPY WITH PROPOFOL;  Surgeon: Toledo, Teodoro K, MD;  Location: [facility] END[facility];  Service: Gastroenterology;  Laterality: N/A;   ESOPHAGOGASTRODUODENOSCOPY     ESOPHAGOGASTRODUODENOSCOPY (EGD) WITH PROPOFOL N/A 01/28/2019   Procedure: ESOPHAGOGASTRODUODENOSCOPY (EGD) WITH PROPOFOL;  Surgeon: Toledo, Teodoro K, MD;  Location: [facility] END[facility]COPY;  Service: Gastroenterology;  Laterality: N/A;   EYE SURGERY      cataract extraction with IOL bilaterally   JOINT REPLACEMENT     LUMBAR LAMINECTOMY/DECOMPRESSION MICRODISCECTOMY  05/24/2011   Procedure: LUMBAR LAMINECTOMY/DECOMPRESSION MICRODISCECTOMY 2 LEVELS;  Surgeon: James P Aplington, MD;  Location: [facility];  Service: Orthopedics;  Laterality: N/A;  L2-L3, L3-L4 (x-ray)   RIGHT OOPHORECTOMY     due to STAPH INFECTION    Allergies  Allergen Reactions   Oxycodone Other (See Comments)    Mental status change   Prednisone     swelling, bruising.    Prior to Admission medications   Medication Sig Start Date End Date Taking? Authorizing Provider  acetaminophen (TYLENOL) 500 MG tablet Take 1,000 mg by mouth every 6 (six) hours as needed. Pain    Yes [provider]  albuterol (PROVENTIL) (2.5 MG/3ML) 0.083% nebulizer solution USE 1 VIAL VIA NEBULIZER EVERY 6 HOURS AS NEEDED FOR WHEEZING OR SHORTNESS OF BREATH 10/23/21  Yes Gilbert, Richard L, MD  amiodarone (PACERONE) 200 MG tablet Take 0.5 tablets (100 mg total) by mouth daily. 12/27/21  Yes Lambert, Cameron T, MD  aspirin 81 MG EC tablet Take 81 mg by mouth daily. Swallow whole.   Yes [provider]  atorvastatin (LIPITOR) 40 MG tablet TAKE 1 TABLET(40 MG) BY MOUTH DAILY 08/06/22  Yes Gollan, Timothy J, MD  carvedilol (COREG) 6.25 MG tablet TAKE 1 TABLET(6.25 MG) BY MOUTH TWICE DAILY WITH A MEAL 08/13/22  Yes Gollan, Timothy J, MD  dapagliflozin propanediol (FARXIGA) 10 MG TABS tablet TAKE 1 TABLET(10 MG) BY MOUTH DAILY 07/16/22  Yes Gollan, Timothy J, MD  doxycycline (VIBRAMYCIN) 100 MG capsule Take 100 mg by mouth 2 (two) times daily. 07/16/22  Yes [provider]  gabapentin (NEURONTIN) 400 MG capsule Take 1 capsule (400 mg total) by mouth 4 (four) times daily. 04/05/22  Yes [name], Makiera, MD  hydrOXYzine (ATARAX) 10 MG tablet TAKE 1 TO 2 TABLETS(10 TO 20 MG) BY MOUTH EVERY 6 HOURS AS NEEDED 07/17/22  Yes [name], Makiera, MD  levothyroxine (SYNTHROID)  100 MCG tablet TAKE 1 TABLET(100 MCG) BY MOUTH DAILY BEFORE BREAKFAST 02/26/22  Yes Rumball, Alison M, DO  losartan (COZAAR) 25 MG tablet Take 1 tablet (25 mg total) by mouth every evening. 07/17/21  Yes Hackney, [name] A, FNP  metFORMIN (GLUCOPHAGE) 1000 MG tablet TAKE 1 TABLET(1000 MG) BY MOUTH DAILY WITH SUPPER 08/14/22  Yes [name], Makiera, MD  omeprazole (PRILOSEC) 20 MG capsule TAKE 1 CAPSULE(20 MG) BY MOUTH DAILY 01/01/22  Yes Ostwalt, Janna, PA-C  SPIRIVA HANDIHALER 18 MCG inhalation capsule PLACE 1 CAPSULE INTO INHALER AND INHALE EVERY DAY AS NEEDED 05/25/21  Yes Gilbert, Richard L, MD  torsemide (DEMADEX) 20 MG tablet Take 40 mg by mouth daily. May take an extra 20 mg after lunch as   needed for swelling.   Yes [provider]  glucose blood (CONTOUR NEXT TEST) test strip Check sugar once daily  DX E11.9 11/16/16   Gilbert, Richard L, MD    Social History   Socioeconomic History   Marital status: Widowed    Spouse name: Not on file   Number of children: Not on file   Years of education: Not on file   Highest education level: Not on file  Occupational History   Not on file  Tobacco Use   Smoking status: Former    Packs/day: 0.50    Years: 50.00    Additional pack years: 0.00    Total pack years: 25.00    Types: Cigarettes    Quit date: 05/16/2004    Years since quitting: 18.2   Smokeless tobacco: Never  Vaping Use   Vaping Use: Never used  Substance and Sexual Activity   Alcohol use: Yes    Comment: socially-1 glass of wine or [place] icea tea once a month   Drug use: No   Sexual activity: Yes    Birth control/protection: None  Other Topics Concern   Not on file  Social History Narrative   Not on file   Social Determinants of Health   Financial Resource Strain: Low Risk  (08/13/2022)   Overall Financial Resource Strain (CARDIA)    Difficulty of Paying Living Expenses: Not hard at all  Food Insecurity: No Food Insecurity (08/23/2022)   Hunger Vital  Sign    Worried About Running Out of Food in the Last Year: Never true    Ran Out of Food in the Last Year: Never true  Transportation Needs: No Transportation Needs (08/23/2022)   PRAPARE - Transportation    Lack of Transportation (Medical): No    Lack of Transportation (Non-Medical): No  Physical Activity: Inactive (08/13/2022)   Exercise Vital Sign    Days of Exercise per Week: 0 days    Minutes of Exercise per Session: 0 min  Stress: No Stress Concern Present (08/13/2022)   [employer] - Occupational Stress Questionnaire    Feeling of Stress : Not at all  Social Connections: Moderately Isolated (08/13/2022)   Social Connection and Isolation Panel [NHANES]    Frequency of Communication with Friends and Family: More than three times a week    Frequency of Social Gatherings with Friends and Family: Three times a week    Attends Religious Services: More than 4 times per year    Active Member of Clubs or Organizations: No    Attends Club or Organization Meetings: Never    Marital Status: Widowed  Intimate Partner Violence: Not At Risk (08/23/2022)   Humiliation, Afraid, Rape, and Kick questionnaire    Fear of Current or Ex-Partner: No    Emotionally Abused: No    Physically Abused: No    Sexually Abused: No     Family History  Problem Relation Age of Onset   Dementia Mother    Lung cancer Father    Heart disease Father    Hypertension Sister    Breast cancer Maternal Aunt    Arthritis Son     ROS: Otherwise negative unless mentioned in HPI  Physical Examination  Vitals:   08/23/22 0428 08/23/22 0829  BP: (!) 101/45 115/65  Pulse: (!) 104 89  Resp: 20 20  Temp: 98.3 F (36.8 C) 98.7 F (37.1 C)  SpO2: 96% 94%   Body mass index is 32.2 kg/m.  General:    WDWN in NAD Gait: Not observed HENT: WNL, normocephalic Pulmonary: normal non-labored breathing, without Rales, rhonchi,  wheezing Cardiac: regular, without  Murmurs, rubs or gallops;  without carotid bruits Abdomen: Positive Bowel sounds, soft, NT/ND, no masses Skin: without rashes Vascular Exam/Pulses: Unable to palpate bilateral lower extremity pulses. Bilat lower extremities are warm to the touch.  Extremities: with ischemic changes, without Gangrene , without cellulitis; with open wounds;  Musculoskeletal: no muscle wasting or atrophy  Neurologic: A&O X 3;  No focal weakness or paresthesias are detected; speech is fluent/normal Psychiatric:  The pt has Normal affect. Lymph:  Unremarkable  CBC    Component Value Date/Time   WBC 13.6 (H) 08/23/2022 0918   RBC 3.54 (L) 08/23/2022 0918   RBC 3.56 (L) 08/23/2022 0544   HGB 7.6 (L) 08/23/2022 0918   HGB 10.5 (L) 12/12/2021 1116   HCT 27.6 (L) 08/23/2022 0918   HCT 36.3 12/12/2021 1116   PLT 219 08/23/2022 0918   PLT 310 12/12/2021 1116   MCV 78.0 (L) 08/23/2022 0918   MCV 81 12/12/2021 1116   MCV 90 06/23/2014 1357   MCH 21.5 (L) 08/23/2022 0918   MCHC 27.5 (L) 08/23/2022 0918   RDW 21.1 (H) 08/23/2022 0918   RDW 15.8 (H) 12/12/2021 1116   RDW 24.3 (H) 06/23/2014 1357   LYMPHSABS 2.0 03/21/2022 2225   LYMPHSABS 1.4 12/12/2021 1116   MONOABS 0.7 03/21/2022 2225   EOSABS 0.4 03/21/2022 2225   EOSABS 0.2 12/12/2021 1116   BASOSABS 0.1 03/21/2022 2225   BASOSABS 0.0 12/12/2021 1116    BMET    Component Value Date/Time   NA 138 08/23/2022 0918   NA 142 04/09/2022 1113   NA 136 07/09/2014 0529   K 4.2 08/23/2022 0918   K 4.6 07/09/2014 0529   CL 108 08/23/2022 0918   CL 104 07/09/2014 0529   CO2 22 08/23/2022 0918   CO2 28 07/09/2014 0529   GLUCOSE 224 (H) 08/23/2022 0918   GLUCOSE 169 (H) 07/09/2014 0529   BUN 52 (H) 08/23/2022 0918   BUN 27 04/09/2022 1113   BUN 20 07/09/2014 0529   CREATININE 1.40 (H) 08/23/2022 1123   CREATININE 0.85 07/09/2014 0529   CALCIUM 7.9 (L) 08/23/2022 0918   CALCIUM 8.6 (L) 07/09/2014 0529   GFRNONAA 37 (L) 08/23/2022 1123   GFRNONAA >60 07/09/2014 0529   GFRAA  84 07/29/2019 0936   GFRAA >60 07/09/2014 0529    COAGS: Lab Results  Component Value Date   INR 1.1 08/23/2022   INR 1.0 05/24/2020   INR 1.0 06/23/2014     Non-Invasive Vascular Imaging:   EXAM:08/23/2022 CT ABDOMEN AND PELVIS WITHOUT CONTRAST  IMPRESSION: 1. No acute noncontrast CT abnormality is seen in the abdomen or pelvis. 2. Aortic and iliac atherosclerosis with probable flow limiting stenosis in the distal abdominal aorta and almost certainly in the common and internal iliac arteries. 3. Diverticulosis without evidence of diverticulitis. 4. Small hiatal hernia. 5. Bilateral posterior basilar lung opacities which could be due to post pneumonic scarring. Component of recurrent pneumonia is not excluded. 6. Osteopenia, postsurgical and degenerative change. 7. Small umbilical fat hernia.  Statin:  Yes.   Beta Blocker:  Yes.   Aspirin:  Yes.   ACEI:  No. ARB:  Yes.   CCB use:  No Other antiplatelets/anticoagulants:  No.    ASSESSMENT/PLAN: This is a 88 y.o. female with a medical history significant of known osteomyelitis of the right second toe based on MRI   that is in the system from early May of this year.  Patient has been following wound care, worsening fatigue, loss of appetite and sleeping most of the time so she was evaluated at outpatient clinic and found to be hypotensive, she was advised to come to ER.   PLAN: Vascular surgery plans on taking the patient to the vascular lab tomorrow Friday, 08/24/2022 for a right lower extremity angiogram with possible intervention.  I discussed in detail with the patient the procedure, benefits, risks, and complications.  He verbalizes her understanding.  I answered all the patient's questions this afternoon.  She would like to proceed as soon as possible.  Patient will be made n.p.o. after midnight.   -I discussed the plan in detail with Dr. Jason Dew MD and he is in agreement with the plan.   Kathryn [name] [facility] 08/23/2022 4:20 PM  

## 2022-08-23 NOTE — Assessment & Plan Note (Signed)
Clinically appears euvolemic.  BNP at 123 with history of CKD stage IV. Prior echocardiogram with EF of 35 to 40%. -Holding home torsemide -Monitor volume status closely as patient is being given IV fluid

## 2022-08-23 NOTE — Progress Notes (Signed)
Progress Note   Patient: Kathryn [name]:[id number] DOB: March 10, 1934 DOA: 08/22/2022     1 DOS: the patient was seen and examined on 08/23/2022   Brief hospital course: Taken from H&P.  Kathryn [name] is a 89 y.o. female with medical history significant of known osteomyelitis of the right second toe based on MRI that is in the system from early May of this year.  Patient has been following wound care, worsening fatigue, loss of appetite and sleeping most of the time so she was evaluated at outpatient clinic and found to be hypotensive, she was advised to come to ER.  In ED her blood pressure was 84/48, labs pertinent for lactic acid of 3.2>>4.9, BUN 66, creatinine 1.53, baseline seems to be around 1.2 , potassium 5.2,  CT of abdomen and pelvis was negative for any acute abnormality.  Lower extremity venous Doppler studies were negative for DVT, foot x-ray with findings of known osteomyelitis of right second toe.  Patient received IV fluid with improvement of blood pressure.  Started on vancomycin and Zosyn.  5/30: Vital normal.  Labs with elevated ESR at 60, CRP normal, BNP 126, preliminary blood cultures negative in 12 hours, lactic acidosis resolved.  Anemia panel with anemia of chronic disease, B12 pending, CBC with worsening leukocytosis at 15.5 and hemoglobin decreased to 7.8, significant change without any obvious bleeding we will repeat CBC. Lactic acidosis can be due to metformin.  Patient was on torsemide and dapagliflozin at home which can cause volume depletion specially with poor p.o. intake. Patient and family eventually decided to proceed with second toe amputation.  No obvious sign of infection surrounding chronic wound.  Discontinuing IV antibiotics. Patient most likely will need vascular procedure before proceeding with amputation. Vascular surgery and podiatry both are on board.  Assessment and Plan: * Hypotension due to hypovolemia Likely hypotension with hypovolemia,  blood pressure responded very well to IV fluid.  Recent poor p.o. intake and she continued to take her diuretic and Farxiga. Preliminary blood cultures negative.  No obvious sign of infection. History of chronic toe osteomyelitis.  She was started on broad-spectrum antibiotics which are discontinued.  Currently sepsis ruled out. -Continue with IV fluid for another day -Holding home antihypertensives and diuretics -Continue to monitor  Lactic acidosis Most likely secondary to dehydration and metformin. Resolved with IV fluid. -Consider discontinuing metformin on discharge   Toe osteomyelitis, right (HCC) Right second DIP joint .  Patient already has an MRI of the site from early May.  Patient has a chronic wound which is being taken care of at wound care center, no obvious sign of infection surrounding that wound.  Patient takes doxycycline at home.  Was given broad-spectrum antibiotics with Zosyn and vancomycin. Chronically elevated ESR with normal CRP. Podiatry was consulted and they are recommending toe amputation Vascular was also consulted for vascular clearance before amputation -Discontinuing Zosyn and vancomycin -Follow-up vascular and podiatry recommendations -Will hold all antibiotics for now  Hyperkalemia, diminished renal excretion Resolved with a dose of Lokelma.  Patient has stage IV renal disease. -Continue to monitor  Essential hypertension Patient is on carvedilol, losartan and torsemide at home. -Holding home losartan and torsemide -Continue to monitor-can resume if needed  Normocytic anemia Hemoglobin decreased to 7.6.  Baseline appears to be around 8.7. 9.4 on admission most likely some hemoconcentration with dehydration. Anemia panel with anemia of chronic disease and vitamin B12 deficiency. -Starting on B12 supplement -Weekly Aranesp ordered  Diabetes mellitus type 2,  uncomplicated [facility]) Patient was on metformin and Farxiga at home. -SSI  Elevated  erythrocyte sedimentation rate Most likely secondary to chronic osteomyelitis.  CRP normal  Hypothyroidism -Continue home Synthroid -Thyroid panel pending  CAD (coronary artery disease) Holding home aspirin for anticipated surgery. -Continue carvedilol and Lipitor  HFrEF (heart failure with reduced ejection fraction) (HCC) Clinically appears euvolemic.  BNP at 123 with history of CKD stage IV. Prior echocardiogram with EF of 35 to 40%. -Holding home torsemide -Monitor volume status closely as patient is being given IV fluid  Paroxysmal atrial fibrillation (HCC) Not on anticoagulation at home. -Continue home amiodarone and carvedilol  FTT (failure to thrive) in adult Patient with slowly decreasing p.o. intake and increased somnolence. -Will get benefit from palliative care  Obesity with body mass index of 30.0-39.9 Estimated body mass index is 32.2 kg/m as calculated from the following:   Height as of this encounter: 5\' 4"  (1.626 m).   Weight as of this encounter: 85.1 kg.     GERD without esophagitis -Continue home Protonix   Subjective: Patient was having bilateral leg pain which has been going on for a while and interfere with ambulation. Had a lengthy discussion regarding p.o. intake, abnormal labs and chronic amputation which can be treated with either chest observation and doxycycline as she was doing before or definitive treatment with toe amputation.  Physical Exam: Vitals:   08/23/22 0420 08/23/22 0421 08/23/22 0428 08/23/22 0829  BP:  (!) 107/49 (!) 101/45 115/65  Pulse:  99 (!) 104 89  Resp:  18 20 20   Temp:  98.2 F (36.8 C) 98.3 F (36.8 C) 98.7 F (37.1 C)  TempSrc:      SpO2:  90% 96% 94%  Weight: 85.1 kg     Height:       General.  Frail elderly lady, in no acute distress. Pulmonary.  Lungs clear bilaterally, normal respiratory effort. CV.  Regular rate and rhythm, no JVD, rub or murmur. Abdomen.  Soft, nontender, nondistended, BS  positive. CNS.  Alert and oriented .  No focal neurologic deficit. Extremities.  No edema, no cyanosis, diminished pedal pulses.  Skin warm.  Psychiatry.  Judgment and insight appears normal.   Data Reviewed: Prior data reviewed  Family Communication: Discussed with son and 2 grandkids at bedside  Disposition: Status is: Inpatient Remains inpatient appropriate because: Severity of illness.  Likely need toe amputation.  Planned Discharge Destination: Home with Home Health  DVT prophylaxis.  Lovenox Time spent: 50 minutes  This record has been created using Conservation officer, historic buildings. Errors have been sought and corrected,but may not always be located. Such creation errors do not reflect on the standard of care.   Author: Arnetha Courser, MD 08/23/2022 2:27 PM  For on call review [email or web address].

## 2022-08-23 NOTE — Consult Note (Signed)
Hospital Consult    Reason for Consult:  Right Lower extremity toe Osteomyelitis Requesting Physician:  Dr Arnetha Courser MD MRN #:  [id number]  History of Present Illness: This is a 89 y.o. female Kathryn [name] is a 89 y.o. female with medical history significant of known osteomyelitis of the right second toe based on MRI that is in the system from early May of this year.  Patient has been following wound care, worsening fatigue, loss of appetite and sleeping most of the time so she was evaluated at outpatient clinic and found to be hypotensive, she was advised to come to ER.   Upon further workup here at [facility] podiatry was consulted for osteomyelitis of the patient's second toe of her right lower extremity.  Dr. Lilian Kapur request vascular surgery to attempt a right lower extremity angiogram to evaluate blood flow before attempting any amputation.  On exam this afternoon patient is resting comfortably in bed after getting some pain medicine other than Tylenol.  Prior she has endorsed pain 7 out of 10 to her right foot and toes.  No other complaints.  Vitals all remained stable.  Past Medical History:  Diagnosis Date   Actinic keratosis    Aortic atherosclerosis (HCC) 05/25/2020   Arthritis    spinal stenosis   Cardiomyopathy - presumed to be nonischemic    a. 03/2020 Echo: EF 40-45%, gr2 DD. Nl RV fxn. Mildly dil LA. Mild MR/ao sclerosis; b. 03/2020 MV: EF 30-44%, no ischemia. Cor Ca2+ in pLAD.   CHF (congestive heart failure) (HCC)    COPD (chronic obstructive pulmonary disease) (HCC)    Coronary artery calcification seen on CT scan    a. 03/2020 MV: CT attenuation images show Ao and mod pLAD Ca2+.   Diabetes mellitus without complication (HCC)    Gastric ulcer 06/2010   treated with Prolisec- states no problems now   Hyperlipidemia    Hypertension    PCP Dr Julieanne Manson   [place]   Hypothyroidism    Left bundle branch block (LBBB)    Neuromuscular disorder (HCC)    slight  numbness right toes- comes and goes   Peripheral vascular disease (HCC)    varicose veins left leg   Pneumonia    PSVT (paroxysmal supraventricular tachycardia)    a. 03/2020 Zio: Avg HR 87 (66-211).  4 beats NSVT.  43 PSVT episodes, longest 10h 74m @ avg of 153 bpm (max 211).  Seen by EP-->amio started (pt wished to avoid EPS/RFCA).   Seasonal allergies    Shortness of breath    Skin cancer    multiple from face   Squamous cell carcinoma of skin 03/23/2013   L dorsal hand   Squamous cell carcinoma of skin 02/04/2014   R forearm/in situ, L pretibial   Squamous cell carcinoma of skin 09/24/2018   R thumb   Squamous cell carcinoma of skin 09/08/2019   L forearm - ED&C   Squamous cell carcinoma of skin 09/08/2019   R dosal hand - ED&C   Squamous cell carcinoma of skin 06/20/2021   R cheek preauricular, excised    Past Surgical History:  Procedure Laterality Date   BACK SURGERY     4/12  Dr Simonne Come- for lumbar stenosis   BREAST CYST ASPIRATION Left 1965   negative   BREAST SURGERY  1962   lumpectomy with biopsy   left   CARPAL TUNNEL RELEASE     right   COLONOSCOPY     COLONOSCOPY WITH  PROPOFOL N/A 06/30/2015   Procedure: COLONOSCOPY WITH PROPOFOL;  Surgeon: Wallace Cullens, MD;  Location: [facility] ENDOSCOPY;  Service: Gastroenterology;  Laterality: N/A;   COLONOSCOPY WITH PROPOFOL N/A 01/22/2017   Procedure: COLONOSCOPY WITH PROPOFOL;  Surgeon: Christena Deem, MD;  Location: [facility] ENDOSCOPY;  Service: Endoscopy;  Laterality: N/A;   COLONOSCOPY WITH PROPOFOL N/A 01/28/2019   Procedure: COLONOSCOPY WITH PROPOFOL;  Surgeon: Toledo, Boykin Nearing, MD;  Location: [facility] ENDOSCOPY;  Service: Gastroenterology;  Laterality: N/A;   ESOPHAGOGASTRODUODENOSCOPY     ESOPHAGOGASTRODUODENOSCOPY (EGD) WITH PROPOFOL N/A 01/28/2019   Procedure: ESOPHAGOGASTRODUODENOSCOPY (EGD) WITH PROPOFOL;  Surgeon: Toledo, Boykin Nearing, MD;  Location: [facility] ENDOSCOPY;  Service: Gastroenterology;  Laterality: N/A;   EYE SURGERY      cataract extraction with IOL bilaterally   JOINT REPLACEMENT     LUMBAR LAMINECTOMY/DECOMPRESSION MICRODISCECTOMY  05/24/2011   Procedure: LUMBAR LAMINECTOMY/DECOMPRESSION MICRODISCECTOMY 2 LEVELS;  Surgeon: Drucilla Schmidt, MD;  Location: [facility] ORS;  Service: Orthopedics;  Laterality: N/A;  L2-L3, L3-L4 (x-ray)   RIGHT OOPHORECTOMY     due to STAPH INFECTION    Allergies  Allergen Reactions   Oxycodone Other (See Comments)    Mental status change   Prednisone     swelling, bruising.    Prior to Admission medications   Medication Sig Start Date End Date Taking? Authorizing Provider  acetaminophen (TYLENOL) 500 MG tablet Take 1,000 mg by mouth every 6 (six) hours as needed. Pain    Yes [provider]  albuterol (PROVENTIL) (2.5 MG/3ML) 0.083% nebulizer solution USE 1 VIAL VIA NEBULIZER EVERY 6 HOURS AS NEEDED FOR WHEEZING OR SHORTNESS OF BREATH 10/23/21  Yes Bosie Clos, MD  amiodarone (PACERONE) 200 MG tablet Take 0.5 tablets (100 mg total) by mouth daily. 12/27/21  Yes Lanier Prude, MD  aspirin 81 MG EC tablet Take 81 mg by mouth daily. Swallow whole.   Yes [provider]  atorvastatin (LIPITOR) 40 MG tablet TAKE 1 TABLET(40 MG) BY MOUTH DAILY 08/06/22  Yes Gollan, Tollie Pizza, MD  carvedilol (COREG) 6.25 MG tablet TAKE 1 TABLET(6.25 MG) BY MOUTH TWICE DAILY WITH A MEAL 08/13/22  Yes Gollan, Tollie Pizza, MD  dapagliflozin propanediol (FARXIGA) 10 MG TABS tablet TAKE 1 TABLET(10 MG) BY MOUTH DAILY 07/16/22  Yes Gollan, Tollie Pizza, MD  doxycycline (VIBRAMYCIN) 100 MG capsule Take 100 mg by mouth 2 (two) times daily. 07/16/22  Yes [provider]  gabapentin (NEURONTIN) 400 MG capsule Take 1 capsule (400 mg total) by mouth 4 (four) times daily. 04/05/22  Yes [name], Makiera, MD  hydrOXYzine (ATARAX) 10 MG tablet TAKE 1 TO 2 TABLETS(10 TO 20 MG) BY MOUTH EVERY 6 HOURS AS NEEDED 07/17/22  Yes [name], Makiera, MD  levothyroxine (SYNTHROID)  100 MCG tablet TAKE 1 TABLET(100 MCG) BY MOUTH DAILY BEFORE BREAKFAST 02/26/22  Yes Caro Laroche, DO  losartan (COZAAR) 25 MG tablet Take 1 tablet (25 mg total) by mouth every evening. 07/17/21  Yes Hackney, [name] A, FNP  metFORMIN (GLUCOPHAGE) 1000 MG tablet TAKE 1 TABLET(1000 MG) BY MOUTH DAILY WITH SUPPER 08/14/22  Yes [name], Makiera, MD  omeprazole (PRILOSEC) 20 MG capsule TAKE 1 CAPSULE(20 MG) BY MOUTH DAILY 01/01/22  Yes Ostwalt, Janna, PA-C  SPIRIVA HANDIHALER 18 MCG inhalation capsule PLACE 1 CAPSULE INTO INHALER AND INHALE EVERY DAY AS NEEDED 05/25/21  Yes Bosie Clos, MD  torsemide (DEMADEX) 20 MG tablet Take 40 mg by mouth daily. May take an extra 20 mg after lunch as  needed for swelling.   Yes [provider]  glucose blood (CONTOUR NEXT TEST) test strip Check sugar once daily  DX E11.9 11/16/16   Bosie Clos, MD    Social History   Socioeconomic History   Marital status: Widowed    Spouse name: Not on file   Number of children: Not on file   Years of education: Not on file   Highest education level: Not on file  Occupational History   Not on file  Tobacco Use   Smoking status: Former    Packs/day: 0.50    Years: 50.00    Additional pack years: 0.00    Total pack years: 25.00    Types: Cigarettes    Quit date: 05/16/2004    Years since quitting: 18.2   Smokeless tobacco: Never  Vaping Use   Vaping Use: Never used  Substance and Sexual Activity   Alcohol use: Yes    Comment: socially-1 glass of wine or [place] icea tea once a month   Drug use: No   Sexual activity: Yes    Birth control/protection: None  Other Topics Concern   Not on file  Social History Narrative   Not on file   Social Determinants of Health   Financial Resource Strain: Low Risk  (08/13/2022)   Overall Financial Resource Strain (CARDIA)    Difficulty of Paying Living Expenses: Not hard at all  Food Insecurity: No Food Insecurity (08/23/2022)   Hunger Vital  Sign    Worried About Running Out of Food in the Last Year: Never true    Ran Out of Food in the Last Year: Never true  Transportation Needs: No Transportation Needs (08/23/2022)   PRAPARE - Administrator, Civil Service (Medical): No    Lack of Transportation (Non-Medical): No  Physical Activity: Inactive (08/13/2022)   Exercise Vital Sign    Days of Exercise per Week: 0 days    Minutes of Exercise per Session: 0 min  Stress: No Stress Concern Present (08/13/2022)   [employer] of Occupational Health - Occupational Stress Questionnaire    Feeling of Stress : Not at all  Social Connections: Moderately Isolated (08/13/2022)   Social Connection and Isolation Panel [NHANES]    Frequency of Communication with Friends and Family: More than three times a week    Frequency of Social Gatherings with Friends and Family: Three times a week    Attends Religious Services: More than 4 times per year    Active Member of Clubs or Organizations: No    Attends Banker Meetings: Never    Marital Status: Widowed  Intimate Partner Violence: Not At Risk (08/23/2022)   Humiliation, Afraid, Rape, and Kick questionnaire    Fear of Current or Ex-Partner: No    Emotionally Abused: No    Physically Abused: No    Sexually Abused: No     Family History  Problem Relation Age of Onset   Dementia Mother    Lung cancer Father    Heart disease Father    Hypertension Sister    Breast cancer Maternal Aunt    Arthritis Son     ROS: Otherwise negative unless mentioned in HPI  Physical Examination  Vitals:   08/23/22 0428 08/23/22 0829  BP: (!) 101/45 115/65  Pulse: (!) 104 89  Resp: 20 20  Temp: 98.3 F (36.8 C) 98.7 F (37.1 C)  SpO2: 96% 94%   Body mass index is 32.2 kg/m.  General:  WDWN in NAD Gait: Not observed HENT: WNL, normocephalic Pulmonary: normal non-labored breathing, without Rales, rhonchi,  wheezing Cardiac: regular, without  Murmurs, rubs or gallops;  without carotid bruits Abdomen: Positive Bowel sounds, soft, NT/ND, no masses Skin: without rashes Vascular Exam/Pulses: Unable to palpate bilateral lower extremity pulses. Bilat lower extremities are warm to the touch.  Extremities: with ischemic changes, without Gangrene , without cellulitis; with open wounds;  Musculoskeletal: no muscle wasting or atrophy  Neurologic: A&O X 3;  No focal weakness or paresthesias are detected; speech is fluent/normal Psychiatric:  The pt has Normal affect. Lymph:  Unremarkable  CBC    Component Value Date/Time   WBC 13.6 (H) 08/23/2022 0918   RBC 3.54 (L) 08/23/2022 0918   RBC 3.56 (L) 08/23/2022 0544   HGB 7.6 (L) 08/23/2022 0918   HGB 10.5 (L) 12/12/2021 1116   HCT 27.6 (L) 08/23/2022 0918   HCT 36.3 12/12/2021 1116   PLT 219 08/23/2022 0918   PLT 310 12/12/2021 1116   MCV 78.0 (L) 08/23/2022 0918   MCV 81 12/12/2021 1116   MCV 90 06/23/2014 1357   MCH 21.5 (L) 08/23/2022 0918   MCHC 27.5 (L) 08/23/2022 0918   RDW 21.1 (H) 08/23/2022 0918   RDW 15.8 (H) 12/12/2021 1116   RDW 24.3 (H) 06/23/2014 1357   LYMPHSABS 2.0 03/21/2022 2225   LYMPHSABS 1.4 12/12/2021 1116   MONOABS 0.7 03/21/2022 2225   EOSABS 0.4 03/21/2022 2225   EOSABS 0.2 12/12/2021 1116   BASOSABS 0.1 03/21/2022 2225   BASOSABS 0.0 12/12/2021 1116    BMET    Component Value Date/Time   NA 138 08/23/2022 0918   NA 142 04/09/2022 1113   NA 136 07/09/2014 0529   K 4.2 08/23/2022 0918   K 4.6 07/09/2014 0529   CL 108 08/23/2022 0918   CL 104 07/09/2014 0529   CO2 22 08/23/2022 0918   CO2 28 07/09/2014 0529   GLUCOSE 224 (H) 08/23/2022 0918   GLUCOSE 169 (H) 07/09/2014 0529   BUN 52 (H) 08/23/2022 0918   BUN 27 04/09/2022 1113   BUN 20 07/09/2014 0529   CREATININE 1.40 (H) 08/23/2022 1123   CREATININE 0.85 07/09/2014 0529   CALCIUM 7.9 (L) 08/23/2022 0918   CALCIUM 8.6 (L) 07/09/2014 0529   GFRNONAA 37 (L) 08/23/2022 1123   GFRNONAA >60 07/09/2014 0529   GFRAA  84 07/29/2019 0936   GFRAA >60 07/09/2014 0529    COAGS: Lab Results  Component Value Date   INR 1.1 08/23/2022   INR 1.0 05/24/2020   INR 1.0 06/23/2014     Non-Invasive Vascular Imaging:   EXAM:08/23/2022 CT ABDOMEN AND PELVIS WITHOUT CONTRAST  IMPRESSION: 1. No acute noncontrast CT abnormality is seen in the abdomen or pelvis. 2. Aortic and iliac atherosclerosis with probable flow limiting stenosis in the distal abdominal aorta and almost certainly in the common and internal iliac arteries. 3. Diverticulosis without evidence of diverticulitis. 4. Small hiatal hernia. 5. Bilateral posterior basilar lung opacities which could be due to post pneumonic scarring. Component of recurrent pneumonia is not excluded. 6. Osteopenia, postsurgical and degenerative change. 7. Small umbilical fat hernia.  Statin:  Yes.   Beta Blocker:  Yes.   Aspirin:  Yes.   ACEI:  No. ARB:  Yes.   CCB use:  No Other antiplatelets/anticoagulants:  No.    ASSESSMENT/PLAN: This is a 89 y.o. female with a medical history significant of known osteomyelitis of the right second toe based on MRI  that is in the system from early May of this year.  Patient has been following wound care, worsening fatigue, loss of appetite and sleeping most of the time so she was evaluated at outpatient clinic and found to be hypotensive, she was advised to come to ER.   PLAN: Vascular surgery plans on taking the patient to the vascular lab tomorrow Friday, 08/24/2022 for a right lower extremity angiogram with possible intervention.  I discussed in detail with the patient the procedure, benefits, risks, and complications.  He verbalizes her understanding.  I answered all the patient's questions this afternoon.  She would like to proceed as soon as possible.  Patient will be made n.p.o. after midnight.   -I discussed the plan in detail with Dr. Festus Barren MD and he is in agreement with the plan.   Marcie Bal [facility] 08/23/2022 4:20 PM

## 2022-08-23 NOTE — Progress Notes (Signed)
pt here with osteo to R foot 2nd toe. pt to have amputation at some point.  walked to br with assistance and on the way back to bed with cna and walker legs gave out and pt assisted to floor on knees by cna.  I was called to room and we assisted to lay her down to grab the lift. pt has a skin tear to bilateral knees and covered with foam. pt back to bed assisted by cna, rn, charge rn with lift. pt states knees hurt a little bit. she is able to move her legs and bend them appropriately. fall protocol initiated and documented.h.duncan hospitalist notified.

## 2022-08-23 NOTE — Assessment & Plan Note (Signed)
Estimated body mass index is 32.2 kg/m as calculated from the following:   Height as of this encounter: 5\' 4"  (1.626 m).   Weight as of this encounter: 85.1 kg.

## 2022-08-23 NOTE — Assessment & Plan Note (Addendum)
Patient is on carvedilol, losartan and torsemide at home. -Holding home losartan and torsemide -Continue to monitor-can resume if needed

## 2022-08-23 NOTE — Progress Notes (Addendum)
       CROSS COVER NOTE  NAME: Kathryn [name] MRN: [id number] DOB : [birth year]    Concern from nurse Eyvonne Mechanic   Fall "pt here with osteo to R foot 2nd toe. pt to have amputation at some point. walked to br with assistance and on the way back to bed with cna and walker legs gave out and pt assisted to floor on knees by cna. I was called to room and we assisted to lay her down to grab the lift. pt has a skin tear to bilateral knees and covered with foam. pt back to bed assisted by cna, rn, charge rn with lift. pt states knees hurt a little bit. she is able to move her legs and bend them appropriately. fall protocol initiated and documented. Melissa"     Pertinent findings on chart review: Last progress note reviewed Patient here with hypotension secondary to hypovolemia which improved with IV fluids.  Also had a lactic acidosis that resolved with IV fluids    08/23/2022    7:59 PM 08/23/2022    6:04 PM 08/23/2022    8:29 AM  Vitals with BMI  Systolic 140 138 409  Diastolic 53 61 65  Pulse 83 88 89      Assessment and  Interventions   Assessment: Fall without significant injury  Plan: Fall precautions and neurologic checks with vitals Continue to monitor No additional imaging for now PT consulted

## 2022-08-23 NOTE — Progress Notes (Signed)
*  PRELIMINARY RESULTS* Echocardiogram 2D Echocardiogram has been performed.  Cristela Blue 08/23/2022, 1:42 PM

## 2022-08-23 NOTE — Assessment & Plan Note (Signed)
Most likely secondary to chronic osteomyelitis.  CRP normal

## 2022-08-23 NOTE — Assessment & Plan Note (Signed)
Holding home aspirin for anticipated surgery. -Continue carvedilol and Lipitor

## 2022-08-23 NOTE — Consult Note (Addendum)
Pharmacy Antibiotic Note  Kathryn [name] is a 89 y.o. female admitted on 08/22/2022 with weakness and hypotension x 2 weeks. Patient with wound on right toe which she follows outpatient wound clinic and has recently been on multiple antibiotics with most recent wound clinic note 07/31/22 stating ADD cipro + flagyl to previous regimen of doxycycline.  Toe X ray c/f acute osteomyelitis. Pharmacy has been consulted for vancomycin and zosyn dosing.  Scr improving. Scr from Dec 2023 1.1-1.4.  Vancomycin loading dose (20 mg/kg) given 5/29 @2100 .  Plan: Zosyn 3.375g IV q8h (4 hour infusion).  Will institute maintenance dosing given that renal function is trending towards improvement. Start Vancomycin IV 1,000 mg every 48 hours Goal AUC 400-550 Estimated AUC 468.7, Cmin 10.3 Used Scr 1.4, IBW, Vd 0.5  Follow up renal function for dose adjustments.   Height: 5\' 4"  (162.6 cm) Weight: 85.1 kg (187 lb 9.8 oz) IBW/kg (Calculated) : 54.7  Temp (24hrs), Avg:98.1 F (36.7 C), Min:97.8 F (36.6 C), Max:98.4 F (36.9 C)  Recent Labs  Lab 08/22/22 1652 08/22/22 1830 08/22/22 2030 08/22/22 2354 08/23/22 0231 08/23/22 0544  WBC 9.8  --   --  4.3  --  15.5*  CREATININE 1.53*  --   --  1.51*  --   --   LATICACIDVEN  --  3.1* 3.2* 4.9* 1.8  --      Estimated Creatinine Clearance: 28.2 mL/min (A) (by C-G formula based on SCr of 1.51 mg/dL (H)).    Allergies  Allergen Reactions   Oxycodone Other (See Comments)    Mental status change   Prednisone     swelling, bruising.    Antimicrobials this admission: 5/29 Zosyn >>  5/29 Vancomycin >>   Dose adjustments this admission: n/a   Microbiology results: 5/29 BCx: NG < 12 hours  Thank you for allowing pharmacy to be a part of this patient's care.  Elliot Gurney, PharmD, BCPS Clinical Pharmacist  08/23/2022 1:29 PM

## 2022-08-23 NOTE — Assessment & Plan Note (Signed)
Patient was on metformin and Farxiga at home. -SSI

## 2022-08-23 NOTE — Hospital Course (Addendum)
Taken from H&P.  Kathryn [name] is a 89 y.o. female with medical history significant of known osteomyelitis of the right second toe based on MRI that is in the system from early May of this year.  Patient has been following wound care, worsening fatigue, loss of appetite and sleeping most of the time so she was evaluated at outpatient clinic and found to be hypotensive, she was advised to come to ER.  In ED her blood pressure was 84/48, labs pertinent for lactic acid of 3.2>>4.9, BUN 66, creatinine 1.53, baseline seems to be around 1.2 , potassium 5.2,  CT of abdomen and pelvis was negative for any acute abnormality.  Lower extremity venous Doppler studies were negative for DVT, foot x-ray with findings of known osteomyelitis of right second toe.  Patient received IV fluid with improvement of blood pressure.  Started on vancomycin and Zosyn.  5/30: Vital normal.  Labs with elevated ESR at 60, CRP normal, BNP 126, preliminary blood cultures negative in 12 hours, lactic acidosis resolved.  Anemia panel with anemia of chronic disease, B12 pending, CBC with worsening leukocytosis at 15.5 and hemoglobin decreased to 7.8, significant change without any obvious bleeding we will repeat CBC. Lactic acidosis can be due to metformin.  Patient was on torsemide and dapagliflozin at home which can cause volume depletion specially with poor p.o. intake. Patient and family eventually decided to proceed with second toe amputation.  No obvious sign of infection surrounding chronic wound.  Discontinuing IV antibiotics. Patient most likely will need vascular procedure before proceeding with amputation. Vascular surgery and podiatry both are on board.  5/31: Vital stable, overnight assisted fall on knees when her legs gave away on the way back from restroom with the help of CNA, small knee abrasions which were treated.  Patient is going for lower extremity angiography and possible intervention with vascular surgery  today. Hemoglobin and creatinine improving.  6/1: Hemodynamically stable.  Lower extremity angiography yesterday with 90% stenosis of bilateral common iliac arteries s/p PCI, 60 to 70% stenosis in the right SFA, left renal artery with limited flow.  Going for second toe amputation tomorrow.  6/2: Remains stable.  Significant improvement in bilateral lower extremity pain and now able to ambulate better after vascular procedure.  Going for second toe amputation today..  6/3: Hemodynamically stable.  S/p second right toe amputation on 08/26/2022.  Hemoglobin at 7.4 this morning.  After discussing with patient decided to proceed with 1 unit of PRBC transfusion.  Patient will be weightbearing on right heel and need to have a close follow-up with podiatry for further recommendations.  Pending PT/OT evaluation.  6/4: Remains stable, hemoglobin improved to 8.6 s/p 1 unit of PRBC.  PT recommending SNF.  TOC is working on it

## 2022-08-23 NOTE — Assessment & Plan Note (Signed)
Not on anticoagulation at home. -Continue home amiodarone and carvedilol

## 2022-08-23 NOTE — Assessment & Plan Note (Signed)
-  Continue home Synthroid -Thyroid panel pending

## 2022-08-24 ENCOUNTER — Encounter
Admission: EM | Disposition: A | Discharge status: Skilled Nursing Facility | Payer: Self-pay | Source: Home / Self Care | Attending: Internal Medicine

## 2022-08-24 DIAGNOSIS — I70235 Atherosclerosis of native arteries of right leg with ulceration of other part of foot: Secondary | ICD-10-CM | POA: Diagnosis not present

## 2022-08-24 DIAGNOSIS — E861 Hypovolemia: Secondary | ICD-10-CM | POA: Diagnosis not present

## 2022-08-24 DIAGNOSIS — M869 Osteomyelitis, unspecified: Secondary | ICD-10-CM | POA: Diagnosis not present

## 2022-08-24 DIAGNOSIS — L97519 Non-pressure chronic ulcer of other part of right foot with unspecified severity: Secondary | ICD-10-CM | POA: Diagnosis not present

## 2022-08-24 DIAGNOSIS — R627 Adult failure to thrive: Secondary | ICD-10-CM

## 2022-08-24 DIAGNOSIS — I701 Atherosclerosis of renal artery: Secondary | ICD-10-CM

## 2022-08-24 DIAGNOSIS — E872 Acidosis, unspecified: Secondary | ICD-10-CM | POA: Diagnosis not present

## 2022-08-24 DIAGNOSIS — I745 Embolism and thrombosis of iliac artery: Secondary | ICD-10-CM

## 2022-08-24 DIAGNOSIS — I708 Atherosclerosis of other arteries: Secondary | ICD-10-CM

## 2022-08-24 DIAGNOSIS — I10 Essential (primary) hypertension: Secondary | ICD-10-CM | POA: Diagnosis not present

## 2022-08-24 DIAGNOSIS — I700 Atherosclerosis of aorta: Secondary | ICD-10-CM

## 2022-08-24 DIAGNOSIS — I480 Paroxysmal atrial fibrillation: Secondary | ICD-10-CM

## 2022-08-24 HISTORY — PX: LOWER EXTREMITY ANGIOGRAPHY: CATH118251

## 2022-08-24 LAB — CULTURE, BLOOD (SINGLE)

## 2022-08-24 LAB — GLUCOSE, CAPILLARY
Glucose-Capillary: 101 mg/dL — ABNORMAL HIGH (ref 70–99)
Glucose-Capillary: 87 mg/dL (ref 70–99)
Glucose-Capillary: 95 mg/dL (ref 70–99)
Glucose-Capillary: 153 mg/dL — ABNORMAL HIGH (ref 70–99)

## 2022-08-24 LAB — BASIC METABOLIC PANEL
Anion gap: 6 (ref 5–15)
BUN: 39 mg/dL — ABNORMAL HIGH (ref 8–23)
CO2: 24 mmol/L (ref 22–32)
Calcium: 8.3 mg/dL — ABNORMAL LOW (ref 8.9–10.3)
Chloride: 108 mmol/L (ref 98–111)
Creatinine, Ser: 1.23 mg/dL — ABNORMAL HIGH (ref 0.44–1.00)
GFR, Estimated: 43 mL/min — ABNORMAL LOW (ref >60)
Glucose, Bld: 108 mg/dL — ABNORMAL HIGH (ref 70–99)
Potassium: 3.9 mmol/L (ref 3.5–5.1)
Sodium: 138 mmol/L (ref 135–145)

## 2022-08-24 LAB — THYROID PANEL WITH TSH
Free Thyroxine Index: 2.3 (ref 1.2–4.9)
T3 Uptake Ratio: 32 % (ref 24–39)
T4, Total: 7.1 ug/dL (ref 4.5–12.0)
TSH: 0.174 u[IU]/mL — ABNORMAL LOW (ref 0.450–4.500)

## 2022-08-24 LAB — CBC
HCT: 28.8 % — ABNORMAL LOW (ref 36.0–46.0)
Hemoglobin: 7.8 g/dL — ABNORMAL LOW (ref 12.0–15.0)
MCH: 21.3 pg — ABNORMAL LOW (ref 26.0–34.0)
MCHC: 27.1 g/dL — ABNORMAL LOW (ref 30.0–36.0)
MCV: 78.5 fL — ABNORMAL LOW (ref 80.0–100.0)
Platelets: 202 10*3/uL (ref 150–400)
RBC: 3.67 MIL/uL — ABNORMAL LOW (ref 3.87–5.11)
RDW: 21.3 % — ABNORMAL HIGH (ref 11.5–15.5)
WBC: 7.8 10*3/uL (ref 4.0–10.5)
nRBC: 0 % (ref 0.0–0.2)

## 2022-08-24 LAB — ACTH: C206 ACTH: 13.6 pg/mL (ref 7.2–63.3)

## 2022-08-24 SURGERY — LOWER EXTREMITY ANGIOGRAPHY
Anesthesia: Moderate Sedation | Laterality: Right

## 2022-08-24 MED ORDER — MIDAZOLAM HCL 2 MG/ML PO SYRP: 8.0000 mg | ORAL_SOLUTION | Freq: Once | ORAL | Status: DC | PRN

## 2022-08-24 MED ORDER — CEFAZOLIN SODIUM-DEXTROSE 2-4 GM/100ML-% IV SOLN
2.0000 g | INTRAVENOUS | Status: AC
  Administered 2022-08-24: 2 g via INTRAVENOUS
  Filled 2022-08-24: qty 100

## 2022-08-24 MED ORDER — CLOPIDOGREL BISULFATE 75 MG PO TABS
75.0000 mg | ORAL_TABLET | Freq: Every day | ORAL | Status: DC
  Administered 2022-08-24 – 2022-08-31 (×8): 75 mg via ORAL
  Filled 2022-08-24 (×8): qty 1

## 2022-08-24 MED ORDER — HEPARIN SODIUM (PORCINE) 1000 UNIT/ML IJ SOLN
INTRAMUSCULAR | Status: AC
  Filled 2022-08-24: qty 10

## 2022-08-24 MED ORDER — MIDAZOLAM HCL 2 MG/2ML IJ SOLN
INTRAMUSCULAR | Status: DC | PRN
  Administered 2022-08-24 (×2): 1 mg via INTRAVENOUS

## 2022-08-24 MED ORDER — FAMOTIDINE 20 MG PO TABS: 40.0000 mg | ORAL_TABLET | Freq: Once | ORAL | Status: DC | PRN

## 2022-08-24 MED ORDER — ASPIRIN 81 MG PO TBEC
81.0000 mg | DELAYED_RELEASE_TABLET | Freq: Every day | ORAL | Status: DC
  Administered 2022-08-24 – 2022-08-31 (×8): 81 mg via ORAL
  Filled 2022-08-24 (×8): qty 1

## 2022-08-24 MED ORDER — CEFAZOLIN SODIUM-DEXTROSE 2-4 GM/100ML-% IV SOLN
INTRAVENOUS | Status: AC
  Filled 2022-08-24: qty 100

## 2022-08-24 MED ORDER — HYDROMORPHONE HCL 1 MG/ML IJ SOLN: 1.0000 mg | Freq: Once | INTRAMUSCULAR | Status: DC | PRN

## 2022-08-24 MED ORDER — METHYLPREDNISOLONE SODIUM SUCC 125 MG IJ SOLR: 125.0000 mg | Freq: Once | INTRAMUSCULAR | Status: DC | PRN

## 2022-08-24 MED ORDER — CHLORHEXIDINE GLUCONATE 4 % EX SOLN: 60.0000 mL | Freq: Once | CUTANEOUS | Status: DC

## 2022-08-24 MED ORDER — FENTANYL CITRATE PF 50 MCG/ML IJ SOSY: 12.5000 ug | PREFILLED_SYRINGE | Freq: Once | INTRAMUSCULAR | Status: DC | PRN

## 2022-08-24 MED ORDER — DIPHENHYDRAMINE HCL 50 MG/ML IJ SOLN: 50.0000 mg | Freq: Once | INTRAMUSCULAR | Status: DC | PRN

## 2022-08-24 MED ORDER — FENTANYL CITRATE (PF) 100 MCG/2ML IJ SOLN
INTRAMUSCULAR | Status: DC | PRN
  Administered 2022-08-24: 25 ug via INTRAVENOUS
  Administered 2022-08-24: 50 ug via INTRAVENOUS

## 2022-08-24 MED ORDER — HEPARIN SODIUM (PORCINE) 1000 UNIT/ML IJ SOLN
INTRAMUSCULAR | Status: DC | PRN
  Administered 2022-08-24: 3000 [IU] via INTRAVENOUS
  Administered 2022-08-24: 2000 [IU] via INTRAVENOUS

## 2022-08-24 MED ORDER — ONDANSETRON HCL 4 MG/2ML IJ SOLN: 4.0000 mg | Freq: Four times a day (QID) | INTRAMUSCULAR | Status: DC | PRN

## 2022-08-24 MED ORDER — MIDAZOLAM HCL 2 MG/2ML IJ SOLN
INTRAMUSCULAR | Status: AC
  Filled 2022-08-24: qty 2

## 2022-08-24 MED ORDER — SODIUM CHLORIDE 0.9 % IV SOLN: INTRAVENOUS | Status: DC

## 2022-08-24 MED ORDER — FENTANYL CITRATE (PF) 100 MCG/2ML IJ SOLN
INTRAMUSCULAR | Status: AC
  Filled 2022-08-24: qty 2

## 2022-08-24 SURGICAL SUPPLY — 18 items
BALLN LUTONIX 018 5X60X130 (BALLOONS) ×1
BALLN LUTONIX DCB 5X60X130 (BALLOONS) ×1
BALLOON LUTONIX 018 5X60X130 (BALLOONS) IMPLANT
BALLOON LUTONIX DCB 5X60X130 (BALLOONS) IMPLANT
CATH ANGIO 5F PIGTAIL 65CM (CATHETERS) IMPLANT
CATH BEACON 5 .035 40 KMP TP (CATHETERS) IMPLANT
CATH BEACON 5 .038 40 KMP TP (CATHETERS) ×1
DEVICE STARCLOSE SE CLOSURE (Vascular Products) IMPLANT
GLIDESHEATH SLENDER 7FR .021G (SHEATH) IMPLANT
GLIDEWIRE ADV .035X180CM (WIRE) IMPLANT
GUIDEWIRE ADV .018X180CM (WIRE) IMPLANT
KIT ENCORE 26 ADVANTAGE (KITS) IMPLANT
KIT MICROPUNCTURE NIT STIFF (SHEATH) IMPLANT
PACK ANGIOGRAPHY (CUSTOM PROCEDURE TRAY) ×1 IMPLANT
SHEATH BRITE TIP 5FRX11 (SHEATH) IMPLANT
STENT LIFESTREAM 9X58X80 (Permanent Stent) IMPLANT
TUBING CONTRAST HIGH PRESS 72 (TUBING) IMPLANT
WIRE GUIDERIGHT .035X150 (WIRE) IMPLANT

## 2022-08-24 NOTE — Progress Notes (Signed)
Progress Note   Patient: Kathryn [name] ZOX:[id number] DOB: Jul 07, 1933 DOA: 08/22/2022     2 DOS: the patient was seen and examined on 08/24/2022   Brief hospital course: Taken from H&P.  Kathryn [name] is a 89 y.o. female with medical history significant of known osteomyelitis of the right second toe based on MRI that is in the system from early May of this year.  Patient has been following wound care, worsening fatigue, loss of appetite and sleeping most of the time so she was evaluated at outpatient clinic and found to be hypotensive, she was advised to come to ER.  In ED her blood pressure was 84/48, labs pertinent for lactic acid of 3.2>>4.9, BUN 66, creatinine 1.53, baseline seems to be around 1.2 , potassium 5.2,  CT of abdomen and pelvis was negative for any acute abnormality.  Lower extremity venous Doppler studies were negative for DVT, foot x-ray with findings of known osteomyelitis of right second toe.  Patient received IV fluid with improvement of blood pressure.  Started on vancomycin and Zosyn.  5/30: Vital normal.  Labs with elevated ESR at 60, CRP normal, BNP 126, preliminary blood cultures negative in 12 hours, lactic acidosis resolved.  Anemia panel with anemia of chronic disease, B12 pending, CBC with worsening leukocytosis at 15.5 and hemoglobin decreased to 7.8, significant change without any obvious bleeding we will repeat CBC. Lactic acidosis can be due to metformin.  Patient was on torsemide and dapagliflozin at home which can cause volume depletion specially with poor p.o. intake. Patient and family eventually decided to proceed with second toe amputation.  No obvious sign of infection surrounding chronic wound.  Discontinuing IV antibiotics. Patient most likely will need vascular procedure before proceeding with amputation. Vascular surgery and podiatry both are on board.  5/31: Vital stable, overnight assisted fall on knees when her legs gave away on the way back  from restroom with the help of CNA, small knee abrasions which were treated.  Patient is going for lower extremity angiography and possible intervention with vascular surgery today. Hemoglobin and creatinine improving.  Assessment and Plan: * Hypotension due to hypovolemia Likely hypotension with hypovolemia, blood pressure responded very well to IV fluid.  Recent poor p.o. intake and she continued to take her diuretic and Farxiga. Preliminary blood cultures negative.  No obvious sign of infection. History of chronic toe osteomyelitis.  She was started on broad-spectrum antibiotics which are discontinued.  Currently sepsis ruled out. -Encourage p.o. hydration after the procedure -Holding home antihypertensives and diuretics -Continue to monitor  Lactic acidosis Most likely secondary to dehydration and metformin. Resolved with IV fluid. -Consider discontinuing metformin on discharge   Toe osteomyelitis, right (HCC) Right second DIP joint .  Patient already has an MRI of the site from early May.  Patient has a chronic wound which is being taken care of at wound care center, no obvious sign of infection surrounding that wound.  Patient takes doxycycline at home.  Was given broad-spectrum antibiotics with Zosyn and vancomycin. Chronically elevated ESR with normal CRP. Podiatry was consulted and they are recommending toe amputation Vascular was also consulted for vascular clearance before amputation -Discontinuing Zosyn and vancomycin -Follow-up vascular and podiatry recommendations -Will hold all antibiotics for now -Going for lower extremity angiography with vascular today  Hyperkalemia, diminished renal excretion Resolved with a dose of Lokelma.  Patient has stage IV renal disease. -Continue to monitor  Essential hypertension Patient is on carvedilol, losartan and torsemide at home. -  Holding home losartan and torsemide -Continue to monitor-can resume if needed  Normocytic  anemia Hemoglobin decreased to 7.6.  Baseline appears to be around 8.7. 9.4 on admission most likely some hemoconcentration with dehydration. Anemia panel with anemia of chronic disease and vitamin B12 deficiency. -Starting on B12 supplement -Weekly Aranesp ordered  Diabetes mellitus type 2, uncomplicated (HCC) Patient was on metformin and Farxiga at home. -SSI  Elevated erythrocyte sedimentation rate Most likely secondary to chronic osteomyelitis.  CRP normal  Hypothyroidism -Continue home Synthroid -Thyroid panel pending  CAD (coronary artery disease) Holding home aspirin for anticipated surgery. -Continue carvedilol and Lipitor  HFrEF (heart failure with reduced ejection fraction) (HCC) Clinically appears euvolemic.  BNP at 123 with history of CKD stage IV. Prior echocardiogram with EF of 35 to 40%. -Holding home torsemide -Monitor volume status closely as patient is being given IV fluid  Paroxysmal atrial fibrillation (HCC) Not on anticoagulation at home. -Continue home amiodarone and carvedilol  FTT (failure to thrive) in adult Patient with slowly decreasing p.o. intake and increased somnolence. -Will get benefit from palliative care  Obesity with body mass index of 30.0-39.9 Estimated body mass index is 32.2 kg/m as calculated from the following:   Height as of this encounter: 5\' 4"  (1.626 m).   Weight as of this encounter: 85.1 kg.     GERD without esophagitis -Continue home Protonix   Subjective: Patient was seen and examined today.  No leg pain today.  Awaiting procedure.  Physical Exam: Vitals:   08/24/22 1355 08/24/22 1400 08/24/22 1415 08/24/22 1430  BP: (!) 127/57 120/86 (!) 109/50 (!) 97/58  Pulse: (!) 0 77 74 62  Resp:  17 12 12   Temp:      TempSrc:      SpO2:  98% 97% 93%  Weight:      Height:       General.  Frail elderly lady, in no acute distress. Pulmonary.  Lungs clear bilaterally, normal respiratory effort. CV.  Regular rate and  rhythm, no JVD, rub or murmur. Abdomen.  Soft, nontender, nondistended, BS positive. CNS.  Alert and oriented .  No focal neurologic deficit. Extremities.  No edema, no cyanosis, diminished pedal pulses Psychiatry.  Judgment and insight appears normal.    Data Reviewed: Prior data reviewed  Family Communication: Discussed with son at bedside  Disposition: Status is: Inpatient Remains inpatient appropriate because: Severity of illness.  Likely need toe amputation.  Planned Discharge Destination: Home with Home Health  DVT prophylaxis.  Lovenox Time spent: 45 minutes  This record has been created using Conservation officer, historic buildings. Errors have been sought and corrected,but may not always be located. Such creation errors do not reflect on the standard of care.   Author: Arnetha Courser, MD 08/24/2022 2:50 PM  For on call review [email or web address].

## 2022-08-24 NOTE — Assessment & Plan Note (Signed)
Patient is on carvedilol, losartan and torsemide at home. -Holding home losartan and torsemide -Continue to monitor-can resume if needed 

## 2022-08-24 NOTE — Progress Notes (Signed)
PT Cancellation Note  Patient Details Name: Kathryn [name] MRN: [id number] DOB: [birth year]   Cancelled Treatment:    Reason Eval/Treat Not Completed: Other (comment). Pt pending toe amputation, PT orders completed at this time, please re-consult when patient is medically appropriate.    Olga Coaster PT, DPT 10:05 AM,08/24/22

## 2022-08-24 NOTE — Op Note (Signed)
[facility]  Percutaneous Study/Intervention Procedural Note   Date of Surgery: 08/24/2022  Surgeon(s):Ranelle Auker    Assistants:none  Pre-operative Diagnosis: PAD with ulceration RLE  Post-operative diagnosis:  Same  Procedure(s) Performed:             1.  Ultrasound guidance for vascular access bilateral femoral arteries             2.  Catheter placement into aorta from bilateral femoral approaches             3.  Aortogram and selective bilateral lower extremity angiograms             4.  Kissing balloon expandable stent placement to bilateral common iliac arteries with 9 mm diameter by 58 mm length Lifestream stents             5.  StarClose closure device bilateral femoral arteries  EBL: 10 cc  Contrast: 80 cc  Fluoro Time: 4.5 minutes  Moderate Conscious Sedation Time: approximately 48 minutes using 2 mg of Versed and 75 mcg of Fentanyl              Indications:  Patient is a 89 y.o.female with a right foot ulceration with osteomyelitis and pain. The patient has nonpalpable pedal pulses.  The patient is brought in for angiography for further evaluation and potential treatment.  Due to the limb threatening nature of the situation, angiogram was performed for attempted limb salvage. The patient is aware that if the procedure fails, amputation would be expected.  The patient also understands that even with successful revascularization, amputation may still be required due to the severity of the situation.  Risks and benefits are discussed and informed consent is obtained.   Procedure:  The patient was identified and appropriate procedural time out was performed.  The patient was then placed supine on the table and prepped and draped in the usual sterile fashion. Moderate conscious sedation was administered during a face to face encounter with the patient throughout the procedure with my supervision of the RN administering medicines and monitoring the patient's  vital signs, pulse oximetry, telemetry and mental status throughout from the start of the procedure until the patient was taken to the recovery room. Ultrasound was used to evaluate the left common femoral artery.  It was patent .  A digital ultrasound image was acquired.  A Seldinger needle was used to access the left common femoral artery under direct ultrasound guidance and a permanent image was performed.  A 0.035 J wire was advanced without resistance and a 5Fr sheath was placed.  Initially, the J-wire would not pass and imaging through the left femoral sheath showed a high-grade near occlusive stenosis in the left common iliac artery.  Using an advantage wire and a Kumpe catheter is able to cross the lesion.  Pigtail catheter was placed into the aorta and an AP aortogram was performed.  This was followed by pelvic obliques.  This demonstrated what appeared to be some disease in the left renal artery that was potentially flow-limiting.  The right renal artery did not appear to have flow-limiting stenosis.  The aorta was calcific but not stenotic.  Both common iliac arteries had greater than 90% stenosis that were highly irregular and calcific with some degree of thrombus formation as well.  Both external iliac arteries were then patent.  The left hypogastric artery appeared occluded and the right hypogastric artery was diseased. I then accessed the right femoral artery  knowing that this would require kissing balloon expandable stents to treat this.  A Seldinger needle was used to access the right femoral artery and a J-wire was placed.  A permanent image was recorded.  We then upsized to 7 French slender sheath on both sides.  Using a Kumpe catheter and advantage wire was able to cross the disease in the right common iliac artery and get into the aorta and confirm intraluminal flow.  Selective bilateral lower extremity angiogram was then performed through the sheaths. This demonstrated mild disease in the left  common femoral artery with no focal stenosis in the left SFA or popliteal arteries with two-vessel runoff distally.  Imaging through the right femoral sheath was done to evaluate the right lower extremity.  There was a moderate stenosis in the right SFA in the 60 to 70% range but no other focal stenosis and two-vessel runoff distally.  It was felt that it was in the patient's best interest to proceed with intervention after these images to avoid a second procedure and a larger amount of contrast and fluoroscopy based off of the findings from the initial angiogram. The patient was systemically heparinized and I upsized to a 7 French sheath in the left femoral artery as well.  With advantage wire and the aorta from both femoral approaches, deployed 9 mm diameter by 58 mm length Lifestream stents in both common iliac arteries rebuilding the distal aorta by about 4 to 5 mm.  This encompassed the lesions in both common iliac arteries entirely.  These were inflated to 12 atm.  Completion imaging showed less than 10% residual stenosis in both common iliac arteries.  I elected to terminate the procedure. The sheath was removed and StarClose closure device was deployed in the right femoral artery with excellent hemostatic result.  Similarly, StarClose closure device was deployed in the left femoral artery with excellent hemostatic result.  The patient was taken to the recovery room in stable condition having tolerated the procedure well.  Findings:               Aortogram:  This demonstrated what appeared to be some disease in the left renal artery that was potentially flow-limiting.  The right renal artery did not appear to have flow-limiting stenosis.  The aorta was calcific but not stenotic.  Both common iliac arteries had greater than 90% stenosis that were highly irregular and calcific with some degree of thrombus formation as well.  Both external iliac arteries were then patent.  The left hypogastric artery appeared  occluded and the right hypogastric artery was diseased.             Left lower Extremity:  This demonstrated mild disease in the left common femoral artery with no focal stenosis in the left SFA or popliteal arteries with two-vessel runoff distally.   Right Lower Extremity: There was a moderate stenosis in the right SFA in the 60 to 70% range but no other focal stenosis and two-vessel runoff distally.   Disposition: Patient was taken to the recovery room in stable condition having tolerated the procedure well.  Complications: None  Festus Barren 08/24/2022 1:54 PM   This note was created with Dragon Medical transcription system. Any errors in dictation are purely unintentional.

## 2022-08-24 NOTE — Interval H&P Note (Signed)
History and Physical Interval Note:  08/24/2022 11:16 AM  Kathryn [name]  has presented today for surgery, with the diagnosis of right leg angio.  The various methods of treatment have been discussed with the patient and family. After consideration of risks, benefits and other options for treatment, the patient has consented to  Procedure(s): Lower Extremity Angiography (Right) as a surgical intervention.  The patient's history has been reviewed, patient examined, no change in status, stable for surgery.  I have reviewed the patient's chart and labs.  Questions were answered to the patient's satisfaction.     Festus Barren

## 2022-08-24 NOTE — Assessment & Plan Note (Signed)
Resolved.  Blood pressure now started trending up Likely hypotension with hypovolemia, blood pressure responded very well to IV fluid.  Recent poor p.o. intake and she continued to take her diuretic and Farxiga. Preliminary blood cultures negative.  No obvious sign of infection. History of chronic toe osteomyelitis.  She was started on broad-spectrum antibiotics which are discontinued.  Currently sepsis ruled out. -Encourage p.o. hydration after the procedure -Continue to monitor

## 2022-08-24 NOTE — Assessment & Plan Note (Signed)
Right second DIP joint .  Patient already has an MRI of the site from early May.  Patient has a chronic wound which is being taken care of at wound care center, no obvious sign of infection surrounding that wound.  Patient takes doxycycline at home.  Was given broad-spectrum antibiotics with Zosyn and vancomycin. Chronically elevated ESR with normal CRP. Vascular was also consulted for vascular clearance before amputation s/p angioplasty. -Broad-spectrum antibiotics were discontinued -Will hold all antibiotics for now -S/p second toe amputation by podiatry on 08/26/2022 -PT is recommending SNF

## 2022-08-24 NOTE — TOC CM/SW Note (Signed)
Patient off the floor for procedure. Will complete readmission prevention screen when appropriate.  Charlynn Court, CSW [phone]

## 2022-08-24 NOTE — Care Management Important Message (Signed)
Important Message  Patient Details  Name: Kathryn [name] MRN: [id number] Date of Birth: [birth year]   Medicare Important Message Given:  Yes     Johnell Comings 08/24/2022, 11:03 AM

## 2022-08-24 NOTE — Assessment & Plan Note (Signed)
Most likely secondary to dehydration and metformin. Resolved with IV fluid. -Consider discontinuing metformin on discharge

## 2022-08-24 NOTE — Progress Notes (Signed)
Report given to IR. Patient being transported by staff to IR.  Cornell Barman Donivan Thammavong

## 2022-08-25 DIAGNOSIS — I10 Essential (primary) hypertension: Secondary | ICD-10-CM | POA: Diagnosis not present

## 2022-08-25 DIAGNOSIS — E872 Acidosis, unspecified: Secondary | ICD-10-CM | POA: Diagnosis not present

## 2022-08-25 DIAGNOSIS — E861 Hypovolemia: Secondary | ICD-10-CM | POA: Diagnosis not present

## 2022-08-25 DIAGNOSIS — M869 Osteomyelitis, unspecified: Secondary | ICD-10-CM | POA: Diagnosis not present

## 2022-08-25 LAB — CBC
HCT: 26.9 % — ABNORMAL LOW (ref 36.0–46.0)
Hemoglobin: 7.4 g/dL — ABNORMAL LOW (ref 12.0–15.0)
MCH: 21.7 pg — ABNORMAL LOW (ref 26.0–34.0)
MCHC: 27.5 g/dL — ABNORMAL LOW (ref 30.0–36.0)
MCV: 78.9 fL — ABNORMAL LOW (ref 80.0–100.0)
Platelets: 182 10*3/uL (ref 150–400)
RBC: 3.41 MIL/uL — ABNORMAL LOW (ref 3.87–5.11)
RDW: 21.5 % — ABNORMAL HIGH (ref 11.5–15.5)
WBC: 8.4 10*3/uL (ref 4.0–10.5)
nRBC: 0.2 % (ref 0.0–0.2)

## 2022-08-25 LAB — BASIC METABOLIC PANEL
Anion gap: 6 (ref 5–15)
BUN: 29 mg/dL — ABNORMAL HIGH (ref 8–23)
CO2: 22 mmol/L (ref 22–32)
Calcium: 8.3 mg/dL — ABNORMAL LOW (ref 8.9–10.3)
Chloride: 108 mmol/L (ref 98–111)
Creatinine, Ser: 1.09 mg/dL — ABNORMAL HIGH (ref 0.44–1.00)
GFR, Estimated: 49 mL/min — ABNORMAL LOW (ref >60)
Glucose, Bld: 193 mg/dL — ABNORMAL HIGH (ref 70–99)
Potassium: 3.9 mmol/L (ref 3.5–5.1)
Sodium: 136 mmol/L (ref 135–145)

## 2022-08-25 LAB — GLUCOSE, CAPILLARY
Glucose-Capillary: 95 mg/dL (ref 70–99)
Glucose-Capillary: 167 mg/dL — ABNORMAL HIGH (ref 70–99)
Glucose-Capillary: 164 mg/dL — ABNORMAL HIGH (ref 70–99)

## 2022-08-25 MED ORDER — FUROSEMIDE 10 MG/ML IJ SOLN
40.0000 mg | Freq: Once | INTRAMUSCULAR | Status: AC
  Administered 2022-08-25: 40 mg via INTRAVENOUS
  Filled 2022-08-25: qty 4

## 2022-08-25 MED ORDER — IPRATROPIUM-ALBUTEROL 0.5-2.5 (3) MG/3ML IN SOLN: 3.0000 mL | RESPIRATORY_TRACT | Status: DC | PRN

## 2022-08-25 MED ORDER — ENOXAPARIN SODIUM 40 MG/0.4ML IJ SOSY
40.0000 mg | PREFILLED_SYRINGE | INTRAMUSCULAR | Status: DC
  Administered 2022-08-26 – 2022-08-31 (×6): 40 mg via SUBCUTANEOUS
  Filled 2022-08-25 (×6): qty 0.4

## 2022-08-25 NOTE — Progress Notes (Signed)
   Spoke with both Dr. Amin and patient's son, Steve, via telephone.  Both the patient and family are in agreement and amenable to the toe amputation of the right second toe.  Spoke in detail about the procedure, risk benefits, advantages and disadvantages of both surgical amputation versus attempting to salvage the toe.  Do believe it is in her best interest in benefit to proceed with toe amputation.  Patient and family are both in agreement.  Steve, her son, is coming to the hospital this afternoon to visit with his mother and he will discuss the plans for toe amputation tomorrow a.m.  Will see patient preoperatively tomorrow a.m. to discuss the procedure again in detail directly with the patient and postoperative recovery course and answering questions she may have.  Preoperative orders placed. NPO after midnight.  Surgery will consist of right second toe amputation. Will continue to follow.   Alisia Vanengen M. Randolf Sansoucie, DPM [facility]  Dr. Zellie Jenning M. Jameka Ivie, DPM    [address]                                    [place], [place] [address]                Office [phone]  Fax [phone]    

## 2022-08-25 NOTE — Progress Notes (Signed)
Progress Note   Patient: Kathryn [name] ZOX:[id number] DOB: 1933/09/30 DOA: 08/22/2022     3 DOS: the patient was seen and examined on 08/25/2022   Brief hospital course: Taken from H&P.  Kathryn [name] is a 89 y.o. female with medical history significant of known osteomyelitis of the right second toe based on MRI that is in the system from early May of this year.  Patient has been following wound care, worsening fatigue, loss of appetite and sleeping most of the time so she was evaluated at outpatient clinic and found to be hypotensive, she was advised to come to ER.  In ED her blood pressure was 84/48, labs pertinent for lactic acid of 3.2>>4.9, BUN 66, creatinine 1.53, baseline seems to be around 1.2 , potassium 5.2,  CT of abdomen and pelvis was negative for any acute abnormality.  Lower extremity venous Doppler studies were negative for DVT, foot x-ray with findings of known osteomyelitis of right second toe.  Patient received IV fluid with improvement of blood pressure.  Started on vancomycin and Zosyn.  5/30: Vital normal.  Labs with elevated ESR at 60, CRP normal, BNP 126, preliminary blood cultures negative in 12 hours, lactic acidosis resolved.  Anemia panel with anemia of chronic disease, B12 pending, CBC with worsening leukocytosis at 15.5 and hemoglobin decreased to 7.8, significant change without any obvious bleeding we will repeat CBC. Lactic acidosis can be due to metformin.  Patient was on torsemide and dapagliflozin at home which can cause volume depletion specially with poor p.o. intake. Patient and family eventually decided to proceed with second toe amputation.  No obvious sign of infection surrounding chronic wound.  Discontinuing IV antibiotics. Patient most likely will need vascular procedure before proceeding with amputation. Vascular surgery and podiatry both are on board.  5/31: Vital stable, overnight assisted fall on knees when her legs gave away on the way back from  restroom with the help of CNA, small knee abrasions which were treated.  Patient is going for lower extremity angiography and possible intervention with vascular surgery today. Hemoglobin and creatinine improving.  6/1: Hemodynamically stable.  Lower extremity angiography yesterday with 90% stenosis of bilateral common iliac arteries s/p PCI, 60 to 70% stenosis in the right SFA, left renal artery with limited flow.  Going for second toe amputation tomorrow  Assessment and Plan: * Hypotension due to hypovolemia Likely hypotension with hypovolemia, blood pressure responded very well to IV fluid.  Recent poor p.o. intake and she continued to take her diuretic and Farxiga. Preliminary blood cultures negative.  No obvious sign of infection. History of chronic toe osteomyelitis.  She was started on broad-spectrum antibiotics which are discontinued.  Currently sepsis ruled out. -Encourage p.o. hydration after the procedure -Holding home antihypertensives and diuretics -Continue to monitor  Lactic acidosis Most likely secondary to dehydration and metformin. Resolved with IV fluid. -Consider discontinuing metformin on discharge   Toe osteomyelitis, right (HCC) Right second DIP joint .  Patient already has an MRI of the site from early May.  Patient has a chronic wound which is being taken care of at wound care center, no obvious sign of infection surrounding that wound.  Patient takes doxycycline at home.  Was given broad-spectrum antibiotics with Zosyn and vancomycin. Chronically elevated ESR with normal CRP. Podiatry was consulted and they are recommending toe amputation Vascular was also consulted for vascular clearance before amputation -Discontinuing Zosyn and vancomycin -Follow-up vascular and podiatry recommendations -Will hold all antibiotics for now -S/p  lower extremity angiography and angioplasty-going for to amputation tomorrow  Hyperkalemia, diminished renal excretion Resolved with  a dose of Lokelma.  Patient has stage IV renal disease. -Continue to monitor  Essential hypertension Patient is on carvedilol, losartan and torsemide at home. -Holding home losartan and torsemide -Continue to monitor-can resume if needed  Normocytic anemia Hemoglobin decreased to 7.6.  Baseline appears to be around 8.7. 9.4 on admission most likely some hemoconcentration with dehydration. Anemia panel with anemia of chronic disease and vitamin B12 deficiency. -Starting on B12 supplement -Weekly Aranesp ordered  Diabetes mellitus type 2, uncomplicated (HCC) Patient was on metformin and Farxiga at home. -SSI  Elevated erythrocyte sedimentation rate Most likely secondary to chronic osteomyelitis.  CRP normal  Hypothyroidism -Continue home Synthroid -Thyroid panel pending  CAD (coronary artery disease) Holding home aspirin for anticipated surgery. -Continue carvedilol and Lipitor  HFrEF (heart failure with reduced ejection fraction) (HCC) Clinically appears euvolemic.  BNP at 123 with history of CKD stage IV. Prior echocardiogram with EF of 35 to 40%. -Holding home torsemide -Monitor volume status closely as patient is being given IV fluid  Paroxysmal atrial fibrillation (HCC) Not on anticoagulation at home. -Continue home amiodarone and carvedilol  FTT (failure to thrive) in adult Patient with slowly decreasing p.o. intake and increased somnolence. -Will get benefit from palliative care  Obesity with body mass index of 30.0-39.9 Estimated body mass index is 32.2 kg/m as calculated from the following:   Height as of this encounter: 5\' 4"  (1.626 m).   Weight as of this encounter: 85.1 kg.     GERD without esophagitis -Continue home Protonix   Subjective: Patient was resting comfortably when seen today.  Denies any leg pain.  Ate breakfast well.  Physical Exam: Vitals:   08/24/22 1700 08/24/22 1919 08/25/22 0415 08/25/22 0736  BP: (!) 114/44 110/60 (!) 124/54  129/63  Pulse: 75 80 77 79  Resp: 18 20 16 19   Temp: 98.7 F (37.1 C) 98 F (36.7 C) 97.9 F (36.6 C) 98 F (36.7 C)  TempSrc: Oral Oral Oral Oral  SpO2: 91% 95% 95% 96%  Weight:      Height:       General.  Frail elderly lady, in no acute distress. Pulmonary.  Lungs clear bilaterally, normal respiratory effort. CV.  Regular rate and rhythm, no JVD, rub or murmur. Abdomen.  Soft, nontender, nondistended, BS positive. CNS.  Alert and oriented .  No focal neurologic deficit. Extremities.  No edema, no cyanosis, pulses intact and symmetrical. Psychiatry.  Judgment and insight appears normal.    Data Reviewed: Prior data reviewed  Family Communication: Discussed with son   Disposition: Status is: Inpatient Remains inpatient appropriate because: Severity of illness.  Likely need toe amputation.  Planned Discharge Destination: Home with Home Health  DVT prophylaxis.  Lovenox Time spent: 44 minutes  This record has been created using Conservation officer, historic buildings. Errors have been sought and corrected,but may not always be located. Such creation errors do not reflect on the standard of care.   Author: Arnetha Courser, MD 08/25/2022 2:59 PM  For on call review [email or web address].

## 2022-08-25 NOTE — Plan of Care (Signed)

## 2022-08-25 NOTE — H&P (View-Only) (Signed)
   Spoke with both Dr. Nelson Chimes and patient's son, Brett Canales, via telephone.  Both the patient and family are in agreement and amenable to the toe amputation of the right second toe.  Spoke in detail about the procedure, risk benefits, advantages and disadvantages of both surgical amputation versus attempting to salvage the toe.  Do believe it is in her best interest in benefit to proceed with toe amputation.  Patient and family are both in agreement.  Brett Canales, her son, is coming to the hospital this afternoon to visit with his mother and he will discuss the plans for toe amputation tomorrow a.m.  Will see patient preoperatively tomorrow a.m. to discuss the procedure again in detail directly with the patient and postoperative recovery course and answering questions she may have.  Preoperative orders placed. NPO after midnight.  Surgery will consist of right second toe amputation. Will continue to follow.   Felecia Shelling, DPM [facility]  Dr. Felecia Shelling, DPM    [address] [place], [place] [address]                Office [phone]  Fax [phone]

## 2022-08-26 ENCOUNTER — Inpatient Hospital Stay: Payer: Medicare HMO | Admitting: Anesthesiology

## 2022-08-26 ENCOUNTER — Inpatient Hospital Stay: Payer: Medicare HMO

## 2022-08-26 ENCOUNTER — Encounter
Admission: EM | Disposition: A | Discharge status: Skilled Nursing Facility | Payer: Self-pay | Source: Home / Self Care | Attending: Internal Medicine

## 2022-08-26 DIAGNOSIS — E872 Acidosis, unspecified: Secondary | ICD-10-CM | POA: Diagnosis not present

## 2022-08-26 DIAGNOSIS — M869 Osteomyelitis, unspecified: Secondary | ICD-10-CM | POA: Diagnosis not present

## 2022-08-26 DIAGNOSIS — E861 Hypovolemia: Secondary | ICD-10-CM | POA: Diagnosis not present

## 2022-08-26 DIAGNOSIS — I10 Essential (primary) hypertension: Secondary | ICD-10-CM | POA: Diagnosis not present

## 2022-08-26 HISTORY — PX: AMPUTATION TOE: SHX6595

## 2022-08-26 LAB — BASIC METABOLIC PANEL
Anion gap: 7 (ref 5–15)
BUN: 30 mg/dL — ABNORMAL HIGH (ref 8–23)
CO2: 23 mmol/L (ref 22–32)
Calcium: 8.3 mg/dL — ABNORMAL LOW (ref 8.9–10.3)
Chloride: 109 mmol/L (ref 98–111)
Creatinine, Ser: 1.05 mg/dL — ABNORMAL HIGH (ref 0.44–1.00)
GFR, Estimated: 52 mL/min — ABNORMAL LOW (ref >60)
Glucose, Bld: 104 mg/dL — ABNORMAL HIGH (ref 70–99)
Potassium: 3.4 mmol/L — ABNORMAL LOW (ref 3.5–5.1)
Sodium: 139 mmol/L (ref 135–145)

## 2022-08-26 LAB — CBC
HCT: 27.7 % — ABNORMAL LOW (ref 36.0–46.0)
Hemoglobin: 7.7 g/dL — ABNORMAL LOW (ref 12.0–15.0)
MCH: 21.6 pg — ABNORMAL LOW (ref 26.0–34.0)
MCHC: 27.8 g/dL — ABNORMAL LOW (ref 30.0–36.0)
MCV: 77.6 fL — ABNORMAL LOW (ref 80.0–100.0)
Platelets: 179 10*3/uL (ref 150–400)
RBC: 3.57 MIL/uL — ABNORMAL LOW (ref 3.87–5.11)
RDW: 21.2 % — ABNORMAL HIGH (ref 11.5–15.5)
WBC: 6.8 10*3/uL (ref 4.0–10.5)
nRBC: 0 % (ref 0.0–0.2)

## 2022-08-26 LAB — GLUCOSE, CAPILLARY
Glucose-Capillary: 92 mg/dL (ref 70–99)
Glucose-Capillary: 86 mg/dL (ref 70–99)
Glucose-Capillary: 92 mg/dL (ref 70–99)
Glucose-Capillary: 125 mg/dL — ABNORMAL HIGH (ref 70–99)
Glucose-Capillary: 111 mg/dL — ABNORMAL HIGH (ref 70–99)

## 2022-08-26 SURGERY — AMPUTATION, TOE
Anesthesia: General | Site: Toe | Laterality: Right

## 2022-08-26 MED ORDER — FE FUM-VIT C-VIT B12-FA 460-60-0.01-1 MG PO CAPS
1.0000 | ORAL_CAPSULE | Freq: Every day | ORAL | Status: DC
  Administered 2022-08-26 – 2022-08-31 (×6): 1 via ORAL
  Filled 2022-08-26 (×6): qty 1

## 2022-08-26 MED ORDER — PHENYLEPHRINE HCL (PRESSORS) 10 MG/ML IV SOLN
INTRAVENOUS | Status: DC | PRN
  Administered 2022-08-26 (×2): 80 ug via INTRAVENOUS

## 2022-08-26 MED ORDER — SODIUM CHLORIDE 0.9 % IV SOLN: INTRAVENOUS | Status: DC | PRN

## 2022-08-26 MED ORDER — 0.9 % SODIUM CHLORIDE (POUR BTL) OPTIME
TOPICAL | Status: DC | PRN
  Administered 2022-08-26: 1000 mL

## 2022-08-26 MED ORDER — BUPIVACAINE HCL 0.5 % IJ SOLN
INTRAMUSCULAR | Status: DC | PRN
  Administered 2022-08-26: 10 mL

## 2022-08-26 MED ORDER — FENTANYL CITRATE (PF) 100 MCG/2ML IJ SOLN
INTRAMUSCULAR | Status: DC | PRN
  Administered 2022-08-26 (×2): 25 ug via INTRAVENOUS
  Administered 2022-08-26: 50 ug via INTRAVENOUS

## 2022-08-26 MED ORDER — EPHEDRINE SULFATE (PRESSORS) 50 MG/ML IJ SOLN
INTRAMUSCULAR | Status: DC | PRN
  Administered 2022-08-26: 5 mg via INTRAVENOUS

## 2022-08-26 MED ORDER — LIDOCAINE HCL (PF) 1 % IJ SOLN
INTRAMUSCULAR | Status: DC | PRN
  Administered 2022-08-26: 10 mL

## 2022-08-26 MED ORDER — FENTANYL CITRATE (PF) 100 MCG/2ML IJ SOLN: 25.0000 ug | INTRAMUSCULAR | Status: DC | PRN

## 2022-08-26 MED ORDER — PROPOFOL 10 MG/ML IV BOLUS
INTRAVENOUS | Status: DC | PRN
  Administered 2022-08-26 (×2): 20 mg via INTRAVENOUS
  Administered 2022-08-26: 30 mg via INTRAVENOUS
  Administered 2022-08-26: 20 mg via INTRAVENOUS

## 2022-08-26 MED ORDER — PROPOFOL 500 MG/50ML IV EMUL
INTRAVENOUS | Status: DC | PRN
  Administered 2022-08-26: 75 ug/kg/min via INTRAVENOUS

## 2022-08-26 MED ORDER — PROPOFOL 1000 MG/100ML IV EMUL
INTRAVENOUS | Status: AC
  Filled 2022-08-26: qty 100

## 2022-08-26 MED ORDER — FENTANYL CITRATE (PF) 100 MCG/2ML IJ SOLN
INTRAMUSCULAR | Status: AC
  Filled 2022-08-26: qty 2

## 2022-08-26 SURGICAL SUPPLY — 45 items
BLADE MED AGGRESSIVE (BLADE) ×1 IMPLANT
BLADE OSC/SAGITTAL MD 5.5X18 (BLADE) IMPLANT
BLADE SURG 15 STRL LF DISP TIS (BLADE) IMPLANT
BLADE SURG 15 STRL SS (BLADE)
BLADE SURG MINI STRL (BLADE) IMPLANT
BNDG ELASTIC 4INX 5YD STR LF (GAUZE/BANDAGES/DRESSINGS) ×1 IMPLANT
BNDG ESMARCH 4 X 12 STRL LF (GAUZE/BANDAGES/DRESSINGS) ×1
BNDG ESMARCH 4X12 STRL LF (GAUZE/BANDAGES/DRESSINGS) ×1 IMPLANT
BNDG GAUZE DERMACEA FLUFF 4 (GAUZE/BANDAGES/DRESSINGS) ×1 IMPLANT
BNDG STRETCH 4X75 STRL LF (GAUZE/BANDAGES/DRESSINGS) IMPLANT
CNTNR URN SCR LID CUP LEK RST (MISCELLANEOUS) ×1 IMPLANT
CONT SPEC 4OZ STRL OR WHT (MISCELLANEOUS) ×1
CUFF TOURN SGL QUICK 12 (TOURNIQUET CUFF) IMPLANT
CUFF TOURN SGL QUICK 18X4 (TOURNIQUET CUFF) IMPLANT
DRAPE FLUOR MINI C-ARM 54X84 (DRAPES) IMPLANT
DRSG ADAPTIC 3X8 NADH LF (GAUZE/BANDAGES/DRESSINGS) IMPLANT
DURAPREP 26ML APPLICATOR (WOUND CARE) ×1 IMPLANT
ELECT REM PT RETURN 9FT ADLT (ELECTROSURGICAL) ×1
ELECTRODE REM PT RTRN 9FT ADLT (ELECTROSURGICAL) ×1 IMPLANT
GAUZE SPONGE 4X4 12PLY STRL (GAUZE/BANDAGES/DRESSINGS) ×2 IMPLANT
GAUZE STRETCH 2X75IN STRL (MISCELLANEOUS) IMPLANT
GAUZE XEROFORM 1X8 LF (GAUZE/BANDAGES/DRESSINGS) ×1 IMPLANT
GLOVE BIO SURGEON STRL SZ8 (GLOVE) ×1 IMPLANT
GLOVE BIOGEL PI IND STRL 8 (GLOVE) ×1 IMPLANT
GOWN STRL REUS W/ TWL LRG LVL3 (GOWN DISPOSABLE) ×2 IMPLANT
GOWN STRL REUS W/TWL LRG LVL3 (GOWN DISPOSABLE) ×2
HANDPIECE VERSAJET DEBRIDEMENT (MISCELLANEOUS) IMPLANT
IV NS IRRIG 3000ML ARTHROMATIC (IV SOLUTION) IMPLANT
KIT TURNOVER KIT A (KITS) ×1 IMPLANT
LABEL OR SOLS (LABEL) ×1 IMPLANT
MANIFOLD NEPTUNE II (INSTRUMENTS) ×1 IMPLANT
NDL FILTER BLUNT 18X1 1/2 (NEEDLE) ×1 IMPLANT
NDL HYPO 25X1 1.5 SAFETY (NEEDLE) ×2 IMPLANT
NEEDLE FILTER BLUNT 18X1 1/2 (NEEDLE) ×1 IMPLANT
NEEDLE HYPO 25X1 1.5 SAFETY (NEEDLE) ×2 IMPLANT
NS IRRIG 500ML POUR BTL (IV SOLUTION) ×1 IMPLANT
PACK EXTREMITY ARMC (MISCELLANEOUS) ×1 IMPLANT
PAD ABD DERMACEA PRESS 5X9 (GAUZE/BANDAGES/DRESSINGS) IMPLANT
SOL PREP PVP 2OZ (MISCELLANEOUS) ×1
SOLUTION PREP PVP 2OZ (MISCELLANEOUS) ×1 IMPLANT
STOCKINETTE STRL 6IN 960660 (GAUZE/BANDAGES/DRESSINGS) ×1 IMPLANT
SWAB CULTURE AMIES ANAERIB BLU (MISCELLANEOUS) IMPLANT
SYR 10ML LL (SYRINGE) ×1 IMPLANT
TRAP FLUID SMOKE EVACUATOR (MISCELLANEOUS) ×1 IMPLANT
WATER STERILE IRR 500ML POUR (IV SOLUTION) ×1 IMPLANT

## 2022-08-26 NOTE — Interval H&P Note (Signed)
History and Physical Interval Note:  08/26/2022 12:09 PM  Kathryn [name]  has presented today for surgery, with the diagnosis of Osteomyelitis.  The various methods of treatment have been discussed with the patient and family. After consideration of risks, benefits and other options for treatment, the patient has consented to  Procedure(s): AMPUTATION TOE (Right) as a surgical intervention.  The patient's history has been reviewed, patient examined, no change in status, stable for surgery.  I have reviewed the patient's chart and labs.  Questions were answered to the patient's satisfaction.     Felecia Shelling

## 2022-08-26 NOTE — Assessment & Plan Note (Signed)
Hemoglobin with further decreased to 7.4.  Baseline appears to be around 8.7. 9.4 on admission most likely some hemoconcentration with dehydration. Anemia panel with anemia of chronic disease and vitamin B12 deficiency. After discussing with patient decided to proceed with 1 unit of PRBC -Starting on B12 supplement -Weekly Aranesp ordered

## 2022-08-26 NOTE — Transfer of Care (Signed)
Immediate Anesthesia Transfer of Care Note  Patient: Kathryn [name]  Procedure(s) Performed: AMPUTATION 2ND  TOE RIGHT FOOT (Right: Toe)  Patient Location: PACU  Anesthesia Type:General  Level of Consciousness: awake, alert , and oriented  Airway & Oxygen Therapy: Patient Spontanous Breathing  Post-op Assessment: Report given to RN and Post -op Vital signs reviewed and stable  Post vital signs: Reviewed and stable  Last Vitals:  Vitals Value Taken Time  BP 134/55 08/26/22 1445  Temp 36.6 C 08/26/22 1445  Pulse 59 08/26/22 1445  Resp 15 08/26/22 1445  SpO2 99 % 08/26/22 1445    Last Pain:  Vitals:   08/26/22 1445  TempSrc: Oral  PainSc:       Patients Stated Pain Goal: 0 (08/26/22 0030)  Complications: No notable events documented.

## 2022-08-26 NOTE — Anesthesia Preprocedure Evaluation (Addendum)
Anesthesia Evaluation  Patient identified by MRN, date of birth, ID band Patient awake    Reviewed: Allergy & Precautions, NPO status , Patient's Chart, lab work & pertinent test results  History of Anesthesia Complications Negative for: history of anesthetic complications  Airway Mallampati: II  TM Distance: >3 FB Neck ROM: full    Dental no notable dental hx.    Pulmonary shortness of breath, COPD, former smoker   Pulmonary exam normal        Cardiovascular hypertension, + CAD, + Peripheral Vascular Disease and +CHF  + dysrhythmias (LBBB) Atrial Fibrillation   IMPRESSIONS     1. Left ventricular ejection fraction, by estimation, is 40 to 45%. The  left ventricle has mildly decreased function. Left ventricular endocardial  border not optimally defined to evaluate regional wall motion. Left  ventricular diastolic parameters are  consistent with Grade I diastolic dysfunction (impaired relaxation).   2. Right ventricular systolic function is normal. The right ventricular  size is normal. There is normal pulmonary artery systolic pressure.   3. The mitral valve was not well visualized. Mild mitral valve  regurgitation. No evidence of mitral stenosis.   4. The aortic valve was not well visualized. Aortic valve regurgitation  is not visualized. No aortic stenosis is present.   5. The inferior vena cava is normal in size with <50% respiratory  variability, suggesting right atrial pressure of 8 mmHg.     Neuro/Psych  PSYCHIATRIC DISORDERS  Depression     Neuromuscular disease    GI/Hepatic Neg liver ROS, PUD,GERD  ,,  Endo/Other  diabetesHypothyroidism    Renal/GU Renal disease  negative genitourinary   Musculoskeletal  (+) Arthritis ,    Abdominal   Peds  Hematology  (+) Blood dyscrasia, anemia   Anesthesia Other Findings Past Medical History: No date: Actinic keratosis 05/25/2020: Aortic atherosclerosis  (HCC) No date: Arthritis     Comment:  spinal stenosis No date: Cardiomyopathy - presumed to be nonischemic     Comment:  a. 03/2020 Echo: EF 40-45%, gr2 DD. Nl RV fxn. Mildly dil              LA. Mild MR/ao sclerosis; b. 03/2020 MV: EF 30-44%, no               ischemia. Cor Ca2+ in pLAD. No date: CHF (congestive heart failure) (HCC) No date: COPD (chronic obstructive pulmonary disease) (HCC) No date: Coronary artery calcification seen on CT scan     Comment:  a. 03/2020 MV: CT attenuation images show Ao and mod pLAD              Ca2+. No date: Diabetes mellitus without complication (HCC) 06/2010: Gastric ulcer     Comment:  treated with Prolisec- states no problems now No date: Hyperlipidemia No date: Hypertension     Comment:  PCP Dr Julieanne Manson   Scenic No date: Hypothyroidism No date: Left bundle branch block (LBBB) No date: Neuromuscular disorder (HCC)     Comment:  slight numbness right toes- comes and goes No date: Peripheral vascular disease (HCC)     Comment:  varicose veins left leg No date: Pneumonia No date: PSVT (paroxysmal supraventricular tachycardia)     Comment:  a. 03/2020 Zio: Avg HR 87 (66-211).  4 beats NSVT.  43               PSVT episodes, longest 10h 25m @ avg of 153 bpm (max  211).  Seen by EP-->amio started (pt wished to avoid               EPS/RFCA). No date: Seasonal allergies No date: Shortness of breath No date: Skin cancer     Comment:  multiple from face 03/23/2013: Squamous cell carcinoma of skin     Comment:  L dorsal hand 02/04/2014: Squamous cell carcinoma of skin     Comment:  R forearm/in situ, L pretibial 09/24/2018: Squamous cell carcinoma of skin     Comment:  R thumb 09/08/2019: Squamous cell carcinoma of skin     Comment:  L forearm - ED&C 09/08/2019: Squamous cell carcinoma of skin     Comment:  R dosal hand - ED&C 06/20/2021: Squamous cell carcinoma of skin     Comment:  R cheek preauricular, excised  Past  Surgical History: No date: BACK SURGERY     Comment:  4/12  Dr Simonne Come- for lumbar stenosis 1965: BREAST CYST ASPIRATION; Left     Comment:  negative 1962: BREAST SURGERY     Comment:  lumpectomy with biopsy   left No date: CARPAL TUNNEL RELEASE     Comment:  right No date: COLONOSCOPY 06/30/2015: COLONOSCOPY WITH PROPOFOL; N/A     Comment:  Procedure: COLONOSCOPY WITH PROPOFOL;  Surgeon: Wallace Cullens, MD;  Location: [facility];  Service:               Gastroenterology;  Laterality: N/A; 01/22/2017: COLONOSCOPY WITH PROPOFOL; N/A     Comment:  Procedure: COLONOSCOPY WITH PROPOFOL;  Surgeon:               Christena Deem, MD;  Location: [facility];                Service: Endoscopy;  Laterality: N/A; 01/28/2019: COLONOSCOPY WITH PROPOFOL; N/A     Comment:  Procedure: COLONOSCOPY WITH PROPOFOL;  Surgeon: Tol[place],               Boykin Nearing, MD;  Location: [facility];  Service:               Gastroenterology;  Laterality: N/A; No date: ESOPHAGOGASTRODUODENOSCOPY 01/28/2019: ESOPHAGOGASTRODUODENOSCOPY (EGD) WITH PROPOFOL; N/A     Comment:  Procedure: ESOPHAGOGASTRODUODENOSCOPY (EGD) WITH               PROPOFOL;  Surgeon: Toledo, Boykin Nearing, MD;  Location:               [facility];  Service: Gastroenterology;  Laterality:               N/A; No date: EYE SURGERY     Comment:  cataract extraction with IOL bilaterally No date: JOINT REPLACEMENT 05/24/2011: LUMBAR LAMINECTOMY/DECOMPRESSION MICRODISCECTOMY     Comment:  Procedure: LUMBAR LAMINECTOMY/DECOMPRESSION               MICRODISCECTOMY 2 LEVELS;  Surgeon: Drucilla Schmidt,               MD;  Location: [facility];  Service: Orthopedics;                Laterality: N/A;  L2-L3, L3-L4 (x-ray) No date: RIGHT OOPHORECTOMY     Comment:  due to STAPH INFECTION  BMI    Body Mass Index: 31.75 kg/m      Reproductive/Obstetrics negative OB ROS  Anesthesia  Physical Anesthesia Plan  ASA: 3  Anesthesia Plan: General   Post-op Pain Management: Regional block*   Induction: Intravenous  PONV Risk Score and Plan: Propofol infusion and TIVA  Airway Management Planned: Natural Airway and Nasal Cannula  Additional Equipment:   Intra-op Plan:   Post-operative Plan:   Informed Consent: I have reviewed the patients History and Physical, chart, labs and discussed the procedure including the risks, benefits and alternatives for the proposed anesthesia with the patient or authorized representative who has indicated his/her understanding and acceptance.     Dental Advisory Given  Plan Discussed with: Anesthesiologist, CRNA and Surgeon  Anesthesia Plan Comments: (Patient consented for risks of anesthesia including but not limited to:  - adverse reactions to medications - risk of airway placement if required - damage to eyes, teeth, lips or other oral mucosa - nerve damage due to positioning  - sore throat or hoarseness - Damage to heart, brain, nerves, lungs, other parts of body or loss of life  Patient voiced understanding.)        Anesthesia Quick Evaluation

## 2022-08-26 NOTE — Progress Notes (Signed)
Progress Note   Patient: Kathryn [name] ZOX:[id number] DOB: 12-10-1933 DOA: 08/22/2022     4 DOS: the patient was seen and examined on 08/26/2022   Brief hospital course: Taken from H&P.  Kathryn [name] is a 89 y.o. female with medical history significant of known osteomyelitis of the right second toe based on MRI that is in the system from early May of this year.  Patient has been following wound care, worsening fatigue, loss of appetite and sleeping most of the time so she was evaluated at outpatient clinic and found to be hypotensive, she was advised to come to ER.  In ED her blood pressure was 84/48, labs pertinent for lactic acid of 3.2>>4.9, BUN 66, creatinine 1.53, baseline seems to be around 1.2 , potassium 5.2,  CT of abdomen and pelvis was negative for any acute abnormality.  Lower extremity venous Doppler studies were negative for DVT, foot x-ray with findings of known osteomyelitis of right second toe.  Patient received IV fluid with improvement of blood pressure.  Started on vancomycin and Zosyn.  5/30: Vital normal.  Labs with elevated ESR at 60, CRP normal, BNP 126, preliminary blood cultures negative in 12 hours, lactic acidosis resolved.  Anemia panel with anemia of chronic disease, B12 pending, CBC with worsening leukocytosis at 15.5 and hemoglobin decreased to 7.8, significant change without any obvious bleeding we will repeat CBC. Lactic acidosis can be due to metformin.  Patient was on torsemide and dapagliflozin at home which can cause volume depletion specially with poor p.o. intake. Patient and family eventually decided to proceed with second toe amputation.  No obvious sign of infection surrounding chronic wound.  Discontinuing IV antibiotics. Patient most likely will need vascular procedure before proceeding with amputation. Vascular surgery and podiatry both are on board.  5/31: Vital stable, overnight assisted fall on knees when her legs gave away on the way back from  restroom with the help of CNA, small knee abrasions which were treated.  Patient is going for lower extremity angiography and possible intervention with vascular surgery today. Hemoglobin and creatinine improving.  6/1: Hemodynamically stable.  Lower extremity angiography yesterday with 90% stenosis of bilateral common iliac arteries s/p PCI, 60 to 70% stenosis in the right SFA, left renal artery with limited flow.  Going for second toe amputation tomorrow.  6/2: Remains stable.  Significant improvement in bilateral lower extremity pain and now able to ambulate better after vascular procedure.  Going for second toe amputation today  Assessment and Plan: * Hypotension due to hypovolemia Resolved.  Blood pressure now started trending up Likely hypotension with hypovolemia, blood pressure responded very well to IV fluid.  Recent poor p.o. intake and she continued to take her diuretic and Farxiga. Preliminary blood cultures negative.  No obvious sign of infection. History of chronic toe osteomyelitis.  She was started on broad-spectrum antibiotics which are discontinued.  Currently sepsis ruled out. -Encourage p.o. hydration after the procedure -Continue to monitor  Lactic acidosis Most likely secondary to dehydration and metformin. Resolved with IV fluid. -Consider discontinuing metformin on discharge   Toe osteomyelitis, right (HCC) Right second DIP joint .  Patient already has an MRI of the site from early May.  Patient has a chronic wound which is being taken care of at wound care center, no obvious sign of infection surrounding that wound.  Patient takes doxycycline at home.  Was given broad-spectrum antibiotics with Zosyn and vancomycin. Chronically elevated ESR with normal CRP. Podiatry was consulted  and they are recommending toe amputation Vascular was also consulted for vascular clearance before amputation s/p angioplasty. -Broad-spectrum antibiotics were discontinued -Will hold all  antibiotics for now -Toe amputation today  Hyperkalemia, diminished renal excretion Resolved with a dose of Lokelma.  Patient has stage IV renal disease. -Continue to monitor  Essential hypertension Patient is on carvedilol, losartan and torsemide at home. -Holding home losartan and torsemide, blood pressure started trending up, we can resume losartan if needed. -Continue to monitor-can resume if needed  Normocytic anemia Hemoglobin seems stable around 7.7.  Baseline appears to be around 8.7. 9.4 on admission most likely some hemoconcentration with dehydration. Anemia panel with anemia of chronic disease and vitamin B12 deficiency. -Starting on B12 supplement -Weekly Aranesp ordered  Diabetes mellitus type 2, uncomplicated (HCC) Patient was on metformin and Farxiga at home. -SSI  Elevated erythrocyte sedimentation rate Most likely secondary to chronic osteomyelitis.  CRP normal  Hypothyroidism -Continue home Synthroid -Thyroid panel pending  CAD (coronary artery disease) Holding home aspirin for anticipated surgery. -Continue carvedilol and Lipitor  HFrEF (heart failure with reduced ejection fraction) (HCC) Clinically appears euvolemic.  BNP at 123 with history of CKD stage IV. Prior echocardiogram with EF of 35 to 40%. -Holding home torsemide -Monitor volume status closely as patient is being given IV fluid  Paroxysmal atrial fibrillation (HCC) Not on anticoagulation at home. -Continue home amiodarone and carvedilol  FTT (failure to thrive) in adult Patient with slowly decreasing p.o. intake and increased somnolence. -Will get benefit from palliative care  Obesity with body mass index of 30.0-39.9 Estimated body mass index is 32.2 kg/m as calculated from the following:   Height as of this encounter: 5\' 4"  (1.626 m).   Weight as of this encounter: 85.1 kg.     GERD without esophagitis -Continue home Protonix   Subjective: Patient was lying comfortably when  seen today.  Stating that her legs feel so different and without any pain.  She was happy that she can ambulate and hoping to get more improvement after second right toe amputation  Physical Exam: Vitals:   08/25/22 1631 08/25/22 1943 08/26/22 0428 08/26/22 0918  BP: 123/66 (!) 115/57 (!) 119/58 (!) 141/57  Pulse: 79 77 68 73  Resp: 18 18 18 18   Temp: 98.5 F (36.9 C) 98.1 F (36.7 C) (!) 97.5 F (36.4 C) 98.3 F (36.8 C)  TempSrc: Oral Oral Oral   SpO2: 96% 96% 97% 97%  Weight:      Height:       General.  Frail elderly lady, in no acute distress. Pulmonary.  Lungs clear bilaterally, normal respiratory effort. CV.  Regular rate and rhythm, no JVD, rub or murmur. Abdomen.  Soft, nontender, nondistended, BS positive. CNS.  Alert and oriented .  No focal neurologic deficit. Extremities.  No edema, no cyanosis, pulses intact and symmetrical. Psychiatry.  Judgment and insight appears normal.     Data Reviewed: Prior data reviewed  Family Communication: DIL at bedside  Disposition: Status is: Inpatient Remains inpatient appropriate because: Severity of illness.  Likely need toe amputation.  Planned Discharge Destination: Home with Home Health  DVT prophylaxis.  Lovenox Time spent: 40 minutes  This record has been created using Conservation officer, historic buildings. Errors have been sought and corrected,but may not always be located. Such creation errors do not reflect on the standard of care.   Author: Arnetha Courser, MD 08/26/2022 1:25 PM  For on call review [email or web address].

## 2022-08-26 NOTE — Anesthesia Postprocedure Evaluation (Signed)
Anesthesia Post Note  Patient: Deliliah L Burruss  Procedure(s) Performed: AMPUTATION 2ND  TOE RIGHT FOOT (Right: Toe)  Patient location during evaluation: PACU Anesthesia Type: General Level of consciousness: awake and alert Pain management: pain level controlled Vital Signs Assessment: post-procedure vital signs reviewed and stable Respiratory status: spontaneous breathing, nonlabored ventilation, respiratory function stable and patient connected to nasal cannula oxygen Cardiovascular status: blood pressure returned to baseline and stable Postop Assessment: no apparent nausea or vomiting Anesthetic complications: no   No notable events documented.   Last Vitals:  Vitals:   08/26/22 1415 08/26/22 1445  BP: (!) 117/49 (!) 134/55  Pulse: 61 (!) 59  Resp: 12 15  Temp: (!) 36.4 C 36.6 C  SpO2: 94% 99%    Last Pain:  Vitals:   08/26/22 1445  TempSrc: Oral  PainSc:                  Louie Boston

## 2022-08-27 ENCOUNTER — Encounter: Payer: Self-pay | Admitting: Podiatry

## 2022-08-27 DIAGNOSIS — E861 Hypovolemia: Secondary | ICD-10-CM | POA: Diagnosis not present

## 2022-08-27 DIAGNOSIS — E872 Acidosis, unspecified: Secondary | ICD-10-CM | POA: Diagnosis not present

## 2022-08-27 DIAGNOSIS — M869 Osteomyelitis, unspecified: Secondary | ICD-10-CM | POA: Diagnosis not present

## 2022-08-27 DIAGNOSIS — I10 Essential (primary) hypertension: Secondary | ICD-10-CM | POA: Diagnosis not present

## 2022-08-27 LAB — CBC
HCT: 26.7 % — ABNORMAL LOW (ref 36.0–46.0)
Hemoglobin: 7.4 g/dL — ABNORMAL LOW (ref 12.0–15.0)
MCH: 21.8 pg — ABNORMAL LOW (ref 26.0–34.0)
MCHC: 27.7 g/dL — ABNORMAL LOW (ref 30.0–36.0)
MCV: 78.8 fL — ABNORMAL LOW (ref 80.0–100.0)
Platelets: 201 10*3/uL (ref 150–400)
RBC: 3.39 MIL/uL — ABNORMAL LOW (ref 3.87–5.11)
RDW: 21.2 % — ABNORMAL HIGH (ref 11.5–15.5)
WBC: 6.3 10*3/uL (ref 4.0–10.5)
nRBC: 0 % (ref 0.0–0.2)

## 2022-08-27 LAB — BASIC METABOLIC PANEL
Anion gap: 5 (ref 5–15)
BUN: 23 mg/dL (ref 8–23)
CO2: 25 mmol/L (ref 22–32)
Calcium: 8.1 mg/dL — ABNORMAL LOW (ref 8.9–10.3)
Chloride: 109 mmol/L (ref 98–111)
Creatinine, Ser: 0.92 mg/dL (ref 0.44–1.00)
GFR, Estimated: >60 mL/min (ref >60)
Glucose, Bld: 107 mg/dL — ABNORMAL HIGH (ref 70–99)
Potassium: 3.6 mmol/L (ref 3.5–5.1)
Sodium: 139 mmol/L (ref 135–145)

## 2022-08-27 LAB — ABO/RH: ABO/RH(D): O POS

## 2022-08-27 LAB — GLUCOSE, CAPILLARY
Glucose-Capillary: 166 mg/dL — ABNORMAL HIGH (ref 70–99)
Glucose-Capillary: 101 mg/dL — ABNORMAL HIGH (ref 70–99)
Glucose-Capillary: 140 mg/dL — ABNORMAL HIGH (ref 70–99)
Glucose-Capillary: 89 mg/dL (ref 70–99)

## 2022-08-27 LAB — BPAM RBC: Unit Type and Rh: 5100

## 2022-08-27 LAB — TYPE AND SCREEN: Antibody Screen: NEGATIVE

## 2022-08-27 LAB — HEMOGLOBIN AND HEMATOCRIT, BLOOD
HCT: 31.7 % — ABNORMAL LOW (ref 36.0–46.0)
Hemoglobin: 8.6 g/dL — ABNORMAL LOW (ref 12.0–15.0)

## 2022-08-27 LAB — CULTURE, BLOOD (SINGLE)
Culture: NO GROWTH
Culture: NO GROWTH

## 2022-08-27 LAB — PREPARE RBC (CROSSMATCH)

## 2022-08-27 MED ORDER — HYDROCODONE-ACETAMINOPHEN 5-325 MG PO TABS
1.0000 | ORAL_TABLET | ORAL | Status: DC | PRN
  Administered 2022-08-27 – 2022-08-31 (×7): 1 via ORAL
  Filled 2022-08-27 (×9): qty 1

## 2022-08-27 MED ORDER — SODIUM CHLORIDE 0.9% IV SOLUTION: Freq: Once | INTRAVENOUS | Status: AC

## 2022-08-27 MED ORDER — SODIUM CHLORIDE 0.9 % IV BOLUS
500.0000 mL | Freq: Once | INTRAVENOUS | Status: AC
  Administered 2022-08-27: 500 mL via INTRAVENOUS

## 2022-08-27 NOTE — Progress Notes (Signed)
Progress Note   Patient: Kathryn [name] ZOX:[id number] DOB: 27-Feb-1934 DOA: 08/22/2022     5 DOS: the patient was seen and examined on 08/27/2022   Brief hospital course: Taken from H&P.  Kathryn [name] is a 89 y.o. female with medical history significant of known osteomyelitis of the right second toe based on MRI that is in the system from early May of this year.  Patient has been following wound care, worsening fatigue, loss of appetite and sleeping most of the time so she was evaluated at outpatient clinic and found to be hypotensive, she was advised to come to ER.  In ED her blood pressure was 84/48, labs pertinent for lactic acid of 3.2>>4.9, BUN 66, creatinine 1.53, baseline seems to be around 1.2 , potassium 5.2,  CT of abdomen and pelvis was negative for any acute abnormality.  Lower extremity venous Doppler studies were negative for DVT, foot x-ray with findings of known osteomyelitis of right second toe.  Patient received IV fluid with improvement of blood pressure.  Started on vancomycin and Zosyn.  5/30: Vital normal.  Labs with elevated ESR at 60, CRP normal, BNP 126, preliminary blood cultures negative in 12 hours, lactic acidosis resolved.  Anemia panel with anemia of chronic disease, B12 pending, CBC with worsening leukocytosis at 15.5 and hemoglobin decreased to 7.8, significant change without any obvious bleeding we will repeat CBC. Lactic acidosis can be due to metformin.  Patient was on torsemide and dapagliflozin at home which can cause volume depletion specially with poor p.o. intake. Patient and family eventually decided to proceed with second toe amputation.  No obvious sign of infection surrounding chronic wound.  Discontinuing IV antibiotics. Patient most likely will need vascular procedure before proceeding with amputation. Vascular surgery and podiatry both are on board.  5/31: Vital stable, overnight assisted fall on knees when her legs gave away on the way back from  restroom with the help of CNA, small knee abrasions which were treated.  Patient is going for lower extremity angiography and possible intervention with vascular surgery today. Hemoglobin and creatinine improving.  6/1: Hemodynamically stable.  Lower extremity angiography yesterday with 90% stenosis of bilateral common iliac arteries s/p PCI, 60 to 70% stenosis in the right SFA, left renal artery with limited flow.  Going for second toe amputation tomorrow.  6/2: Remains stable.  Significant improvement in bilateral lower extremity pain and now able to ambulate better after vascular procedure.  Going for second toe amputation today..  6/3: Hemodynamically stable.  S/p second right toe amputation on 08/26/2022.  Hemoglobin at 7.4 this morning.  After discussing with patient decided to proceed with 1 unit of PRBC transfusion.  Patient will be weightbearing on right heel and need to have a close follow-up with podiatry for further recommendations.  Pending PT/OT evaluation  Assessment and Plan: * Hypotension due to hypovolemia Resolved.   Likely hypotension with hypovolemia, blood pressure responded very well to IV fluid.  Recent poor p.o. intake and she continued to take her diuretic and Farxiga. Preliminary blood cultures negative.  No obvious sign of infection. History of chronic toe osteomyelitis.  She was started on broad-spectrum antibiotics which are discontinued.  Currently sepsis ruled out. -Encourage p.o. hydration after the procedure -Continue to monitor  Lactic acidosis Most likely secondary to dehydration and metformin. Resolved with IV fluid. -Consider discontinuing metformin on discharge   Toe osteomyelitis, right (HCC) Right second DIP joint .  Patient already has an MRI of the site  from early May.  Patient has a chronic wound which is being taken care of at wound care center, no obvious sign of infection surrounding that wound.  Patient takes doxycycline at home.  Was given  broad-spectrum antibiotics with Zosyn and vancomycin. Chronically elevated ESR with normal CRP. Podiatry was consulted and they are recommending toe amputation Vascular was also consulted for vascular clearance before amputation s/p angioplasty. -Broad-spectrum antibiotics were discontinued -Will hold all antibiotics for now -S/p second toe amputation by podiatry on 08/26/2022  Hyperkalemia, diminished renal excretion Resolved with a dose of Lokelma.  Patient has stage IV renal disease. -Continue to monitor  Essential hypertension Patient is on carvedilol, losartan and torsemide at home. -Holding home losartan and torsemide, blood pressure started trending up, we can resume losartan if needed. -Continue to monitor-can resume if needed  Normocytic anemia Hemoglobin with further decreased to 7.4.  Baseline appears to be around 8.7. 9.4 on admission most likely some hemoconcentration with dehydration. Anemia panel with anemia of chronic disease and vitamin B12 deficiency. After discussing with patient decided to proceed with 1 unit of PRBC -Starting on B12 supplement -Weekly Aranesp ordered  Diabetes mellitus type 2, uncomplicated (HCC) Patient was on metformin and Farxiga at home. -SSI  Elevated erythrocyte sedimentation rate Most likely secondary to chronic osteomyelitis.  CRP normal  Hypothyroidism -Continue home Synthroid -Thyroid panel pending  CAD (coronary artery disease) Holding home aspirin for anticipated surgery. -Continue carvedilol and Lipitor  HFrEF (heart failure with reduced ejection fraction) (HCC) Clinically appears euvolemic.  BNP at 123 with history of CKD stage IV. Prior echocardiogram with EF of 35 to 40%. -Holding home torsemide -Monitor volume status closely as patient is being given IV fluid  Paroxysmal atrial fibrillation (HCC) Not on anticoagulation at home. -Continue home amiodarone and carvedilol  FTT (failure to thrive) in adult Patient with  slowly decreasing p.o. intake and increased somnolence. -Will get benefit from palliative care  Obesity with body mass index of 30.0-39.9 Estimated body mass index is 32.2 kg/m as calculated from the following:   Height as of this encounter: 5\' 4"  (1.626 m).   Weight as of this encounter: 85.1 kg.     GERD without esophagitis -Continue home Protonix   Subjective: Patient was seen and examined today.  No new concern.  Little dizzy when getting up.  Agreed to proceed with 1 unit of PRBC.  Physical Exam: Vitals:   08/26/22 1700 08/26/22 1913 08/27/22 0418 08/27/22 0904  BP: (!) 101/48 (!) 91/52 (!) 137/59 (!) 103/45  Pulse: 69 68 72 79  Resp: 18 20 20 18   Temp:  (!) 97.5 F (36.4 C) 97.8 F (36.6 C) 97.9 F (36.6 C)  TempSrc:  Oral Oral   SpO2: 90% 96% 96% 98%  Weight:      Height:       General.  Frail elderly lady, in no acute distress. Pulmonary.  Lungs clear bilaterally, normal respiratory effort. CV.  Regular rate and rhythm, no JVD, rub or murmur. Abdomen.  Soft, nontender, nondistended, BS positive. CNS.  Alert and oriented .  No focal neurologic deficit. Extremities.  No edema, no cyanosis, pulses intact and symmetrical.  Right foot with Ace wrap Psychiatry.  Judgment and insight appears normal.     Data Reviewed: Prior data reviewed  Family Communication: Talked with son on phone  Disposition: Status is: Inpatient Remains inpatient appropriate because: Severity of illness.  Likely need toe amputation.  Planned Discharge Destination: Home with Home Health  DVT prophylaxis.  Lovenox Time spent: 42 minutes  This record has been created using Conservation officer, historic buildings. Errors have been sought and corrected,but may not always be located. Such creation errors do not reflect on the standard of care.   Author: Arnetha Courser, MD 08/27/2022 3:32 PM  For on call review [email or web address].

## 2022-08-27 NOTE — Progress Notes (Signed)
Progress Note    08/27/2022 3:41 PM 1 Day Post-Op  Subjective:  Kathryn [name] is a 89 y.o. female with medical history significant of known osteomyelitis of the right second toe based on MRI that is in the system from early May of this year.  Patient has been following wound care, worsening fatigue, loss of appetite and sleeping most of the time so she was evaluated at outpatient clinic and found to be hypotensive, she was advised to come to ER   Patient is now postop day 3 from an aortogram and selective bilateral lower extremity angiograms.  She also had kissing balloon expandable stent placement bilateral common iliac arteries.  Patient is also postop day 2 from right lower extremity second digit amputation.  On exam this afternoon patient was resting comfortably in bed.  She endorses some soreness to bilateral groins.  Patient endorses that her legs feel much better and much stronger today.  Denies any pain to her right foot at the point of amputation.  Does endorse some numbness and tingling to her toes on her right foot.  Complaints overnight vitals all remained stable.    Vitals:   08/27/22 0418 08/27/22 0904  BP: (!) 137/59 (!) 103/45  Pulse: 72 79  Resp: 20 18  Temp: 97.8 F (36.6 C) 97.9 F (36.6 C)  SpO2: 96% 98%   Physical Exam: Cardiac:  RRR, No JVD, Rub or murmur.  Lungs:  Clear on auscultation bilaterally. Normal respiratory effort no rhonchi, rales or wheezing.  Incisions:  Bilateral groins with dressing clean dry and intact. No hematoma, seroma or infection to note.  Right foot second toe amputation noted.  Dressing is clean dry and intact. Extremities: Lower extremity second digit amputation by podiatry.  Incision is wrapped Curlex and Ace bandage.  I did not remove today. Abdomen: Positive bowel sounds throughout, soft, flat, nondistended, nontender. Neurologic: No oriented x 3, follows commands and answers all questions appropriately.  CBC    Component Value  Date/Time   WBC 6.3 08/27/2022 0513   RBC 3.39 (L) 08/27/2022 0513   HGB 7.4 (L) 08/27/2022 0513   HGB 10.5 (L) 12/12/2021 1116   HCT 26.7 (L) 08/27/2022 0513   HCT 36.3 12/12/2021 1116   PLT 201 08/27/2022 0513   PLT 310 12/12/2021 1116   MCV 78.8 (L) 08/27/2022 0513   MCV 81 12/12/2021 1116   MCV 90 06/23/2014 1357   MCH 21.8 (L) 08/27/2022 0513   MCHC 27.7 (L) 08/27/2022 0513   RDW 21.2 (H) 08/27/2022 0513   RDW 15.8 (H) 12/12/2021 1116   RDW 24.3 (H) 06/23/2014 1357   LYMPHSABS 2.0 03/21/2022 2225   LYMPHSABS 1.4 12/12/2021 1116   MONOABS 0.7 03/21/2022 2225   EOSABS 0.4 03/21/2022 2225   EOSABS 0.2 12/12/2021 1116   BASOSABS 0.1 03/21/2022 2225   BASOSABS 0.0 12/12/2021 1116    BMET    Component Value Date/Time   NA 139 08/27/2022 0513   NA 142 04/09/2022 1113   NA 136 07/09/2014 0529   K 3.6 08/27/2022 0513   K 4.6 07/09/2014 0529   CL 109 08/27/2022 0513   CL 104 07/09/2014 0529   CO2 25 08/27/2022 0513   CO2 28 07/09/2014 0529   GLUCOSE 107 (H) 08/27/2022 0513   GLUCOSE 169 (H) 07/09/2014 0529   BUN 23 08/27/2022 0513   BUN 27 04/09/2022 1113   BUN 20 07/09/2014 0529   CREATININE 0.92 08/27/2022 0513   CREATININE 0.85 07/09/2014 0529  CALCIUM 8.1 (L) 08/27/2022 0513   CALCIUM 8.6 (L) 07/09/2014 0529   GFRNONAA >60 08/27/2022 0513   GFRNONAA >60 07/09/2014 0529   GFRAA 84 07/29/2019 0936   GFRAA >60 07/09/2014 0529    INR    Component Value Date/Time   INR 1.1 08/23/2022 0544   INR 1.0 06/23/2014 1357     Intake/Output Summary (Last 24 hours) at 08/27/2022 1541 Last data filed at 08/27/2022 1417 Gross per 24 hour  Intake 420 ml  Output --  Net 420 ml     Assessment/Plan:  89 y.o. female is s/p aortogram and selective bilateral lower extremity angiograms with kissing balloon expandable stent placement to bilateral common iliac arteries.  Postop day #2.  1 Day Post-Op   Patient is recovering as expected from procedure with stent placement.   Patient has warm bilateral lower extremities and palpable posttibial pulses.  Patient is also postop via podiatry right toe second digit amputation.  No complications to note vitals are remained stable.  Patient endorses she will be receiving a unit of packed red blood cells today for hemoglobin of 7.4 and hematocrit of 26.7. Vascular surgery will continue to monitor.  DVT prophylaxis: Aspirin 81 mg daily, Lovenox 40 mg subcu every 24 hours.   Marcie Bal [facility] 08/27/2022 3:41 PM

## 2022-08-27 NOTE — Progress Notes (Signed)
Subjective:  Patient ID: Kathryn [name], female    DOB: 07/18/1933,  MRN: [id number]  Patient seen this afternnon s/p right second toe amputation. Relates minimal pain doing well. Denies n/v/f/c.   Past Medical History:  Diagnosis Date   Actinic keratosis    Aortic atherosclerosis (HCC) 05/25/2020   Arthritis    spinal stenosis   Cardiomyopathy - presumed to be nonischemic    a. 03/2020 Echo: EF 40-45%, gr2 DD. Nl RV fxn. Mildly dil LA. Mild MR/ao sclerosis; b. 03/2020 MV: EF 30-44%, no ischemia. Cor Ca2+ in pLAD.   CHF (congestive heart failure) (HCC)    COPD (chronic obstructive pulmonary disease) (HCC)    Coronary artery calcification seen on CT scan    a. 03/2020 MV: CT attenuation images show Ao and mod pLAD Ca2+.   Diabetes mellitus without complication (HCC)    Gastric ulcer 06/2010   treated with Prolisec- states no problems now   Hyperlipidemia    Hypertension    PCP Dr Julieanne Manson   [place]   Hypothyroidism    Left bundle branch block (LBBB)    Neuromuscular disorder (HCC)    slight numbness right toes- comes and goes   Peripheral vascular disease (HCC)    varicose veins left leg   Pneumonia    PSVT (paroxysmal supraventricular tachycardia)    a. 03/2020 Zio: Avg HR 87 (66-211).  4 beats NSVT.  43 PSVT episodes, longest 10h 17m @ avg of 153 bpm (max 211).  Seen by EP-->amio started (pt wished to avoid EPS/RFCA).   Seasonal allergies    Shortness of breath    Skin cancer    multiple from face   Squamous cell carcinoma of skin 03/23/2013   L dorsal hand   Squamous cell carcinoma of skin 02/04/2014   R forearm/in situ, L pretibial   Squamous cell carcinoma of skin 09/24/2018   R thumb   Squamous cell carcinoma of skin 09/08/2019   L forearm - ED&C   Squamous cell carcinoma of skin 09/08/2019   R dosal hand - ED&C   Squamous cell carcinoma of skin 06/20/2021   R cheek preauricular, excised     Past Surgical History:  Procedure Laterality Date    AMPUTATION TOE Right 08/26/2022   Procedure: AMPUTATION 2ND  TOE RIGHT FOOT;  Surgeon: Felecia Shelling, DPM;  Location: [facility] ORS;  Service: Podiatry;  Laterality: Right;   BACK SURGERY     4/12  Dr Simonne Come- for lumbar stenosis   BREAST CYST ASPIRATION Left 1965   negative   BREAST SURGERY  1962   lumpectomy with biopsy   left   CARPAL TUNNEL RELEASE     right   COLONOSCOPY     COLONOSCOPY WITH PROPOFOL N/A 06/30/2015   Procedure: COLONOSCOPY WITH PROPOFOL;  Surgeon: Wallace Cullens, MD;  Location: [facility] ENDOSCOPY;  Service: Gastroenterology;  Laterality: N/A;   COLONOSCOPY WITH PROPOFOL N/A 01/22/2017   Procedure: COLONOSCOPY WITH PROPOFOL;  Surgeon: Christena Deem, MD;  Location: [facility] ENDOSCOPY;  Service: Endoscopy;  Laterality: N/A;   COLONOSCOPY WITH PROPOFOL N/A 01/28/2019   Procedure: COLONOSCOPY WITH PROPOFOL;  Surgeon: Toledo, Boykin Nearing, MD;  Location: [facility] ENDOSCOPY;  Service: Gastroenterology;  Laterality: N/A;   ESOPHAGOGASTRODUODENOSCOPY     ESOPHAGOGASTRODUODENOSCOPY (EGD) WITH PROPOFOL N/A 01/28/2019   Procedure: ESOPHAGOGASTRODUODENOSCOPY (EGD) WITH PROPOFOL;  Surgeon: Toledo, Boykin Nearing, MD;  Location: [facility] ENDOSCOPY;  Service: Gastroenterology;  Laterality: N/A;   EYE SURGERY     cataract extraction  with IOL bilaterally   JOINT REPLACEMENT     LOWER EXTREMITY ANGIOGRAPHY Right 08/24/2022   Procedure: Lower Extremity Angiography;  Surgeon: Annice Needy, MD;  Location: [facility] INVASIVE CV LAB;  Service: Cardiovascular;  Laterality: Right;   LUMBAR LAMINECTOMY/DECOMPRESSION MICRODISCECTOMY  05/24/2011   Procedure: LUMBAR LAMINECTOMY/DECOMPRESSION MICRODISCECTOMY 2 LEVELS;  Surgeon: Drucilla Schmidt, MD;  Location: [facility] ORS;  Service: Orthopedics;  Laterality: N/A;  L2-L3, L3-L4 (x-ray)   RIGHT OOPHORECTOMY     due to STAPH INFECTION       Latest Ref Rng & Units 08/27/2022    5:13 AM 08/26/2022    5:03 AM 08/25/2022   10:19 AM  CBC  WBC 4.0 - 10.5 K/uL 6.3  6.8  8.4   Hemoglobin 12.0 -  15.0 g/dL 7.4  7.7  7.4   Hematocrit 36.0 - 46.0 % 26.7  27.7  26.9   Platelets 150 - 400 K/uL 201  179  182        Latest Ref Rng & Units 08/27/2022    5:13 AM 08/26/2022    5:03 AM 08/25/2022   10:19 AM  BMP  Glucose 70 - 99 mg/dL 161  096  045   BUN 8 - 23 mg/dL 23  30  29    Creatinine 0.44 - 1.00 mg/dL 4.09  8.11  9.14   Sodium 135 - 145 mmol/L 139  139  136   Potassium 3.5 - 5.1 mmol/L 3.6  3.4  3.9   Chloride 98 - 111 mmol/L 109  109  108   CO2 22 - 32 mmol/L 25  23  22    Calcium 8.9 - 10.3 mg/dL 8.1  8.3  8.3      Objective:   Vitals:   08/27/22 0418 08/27/22 0904  BP: (!) 137/59 (!) 103/45  Pulse: 72 79  Resp: 20 18  Temp: 97.8 F (36.6 C) 97.9 F (36.6 C)  SpO2: 96% 98%    General:AA&O x 3. Normal mood and affect   Vascular: DP and PT pulses 2/4 bilateral. Brisk capillary refill to all digits. Pedal hair present   Neruological. Epicritic sensation grossly intact.   Derm: Iniciosn right foot well coapted. No dehiscence noted. No erythema edema or purulence noted.   MSK: MMT 5/5 in dorsiflexion, plantar flexion, inversion and eversion. Normal joint ROM without pain or crepitus.       Assessment & Plan:  Patient was evaluated and treated and all questions answered.  DX: s/p right second digit amputation.  Patient to keep dressing clean dry and intact until follow-up on Friday in our office.  Patient to be heel weight bearing to operative extremity.  No antibiotics necessary at this time as surgical cure.  Podiatry to sign off at this time.   Louann Sjogren, DPM  Accessible via secure chat for questions or concerns.

## 2022-08-28 DIAGNOSIS — M86671 Other chronic osteomyelitis, right ankle and foot: Secondary | ICD-10-CM

## 2022-08-28 DIAGNOSIS — I10 Essential (primary) hypertension: Secondary | ICD-10-CM | POA: Diagnosis not present

## 2022-08-28 DIAGNOSIS — E872 Acidosis, unspecified: Secondary | ICD-10-CM | POA: Diagnosis not present

## 2022-08-28 DIAGNOSIS — E861 Hypovolemia: Secondary | ICD-10-CM | POA: Diagnosis not present

## 2022-08-28 DIAGNOSIS — M869 Osteomyelitis, unspecified: Secondary | ICD-10-CM | POA: Diagnosis not present

## 2022-08-28 LAB — PROTEIN ELECTROPHORESIS, SERUM
A/G Ratio: 1.1 (ref 0.7–1.7)
Albumin ELP: 2.4 g/dL — ABNORMAL LOW (ref 2.9–4.4)
Alpha-1-Globulin: 0.2 g/dL (ref 0.0–0.4)
Alpha-2-Globulin: 0.8 g/dL (ref 0.4–1.0)
Beta Globulin: 0.7 g/dL (ref 0.7–1.3)
Gamma Globulin: 0.5 g/dL (ref 0.4–1.8)
Globulin, Total: 2.1 g/dL — ABNORMAL LOW (ref 2.2–3.9)
Total Protein ELP: 4.5 g/dL — ABNORMAL LOW (ref 6.0–8.5)

## 2022-08-28 LAB — GLUCOSE, CAPILLARY
Glucose-Capillary: 109 mg/dL — ABNORMAL HIGH (ref 70–99)
Glucose-Capillary: 132 mg/dL — ABNORMAL HIGH (ref 70–99)
Glucose-Capillary: 143 mg/dL — ABNORMAL HIGH (ref 70–99)
Glucose-Capillary: 151 mg/dL — ABNORMAL HIGH (ref 70–99)

## 2022-08-28 LAB — TYPE AND SCREEN
ABO/RH(D): O POS
Unit division: 0

## 2022-08-28 LAB — BPAM RBC
Blood Product Expiration Date: 202407062359
ISSUE DATE / TIME: 202406031539

## 2022-08-28 MED ORDER — TORSEMIDE 20 MG PO TABS: 20.0000 mg | ORAL_TABLET | Freq: Every day | ORAL | Status: DC | PRN

## 2022-08-28 MED ORDER — TORSEMIDE 20 MG PO TABS
40.0000 mg | ORAL_TABLET | Freq: Every day | ORAL | Status: DC
  Administered 2022-08-28 – 2022-08-31 (×4): 40 mg via ORAL
  Filled 2022-08-28 (×5): qty 2

## 2022-08-28 NOTE — Evaluation (Signed)
Occupational Therapy Evaluation Patient Details Name: Kathryn [name] MRN: [id number] DOB: 1933-04-17 Today's Date: 08/28/2022   History of Present Illness Pt is an 89 y.o. female presenting to hospital 08/22/22 with c/o weakness and hypotension.  Pt admitted with hypotension d/t hypovolemia, lactic acidosis, hyperkalemia, osteomyelitis R second DIP joint, and normocytic anemia.  6/1 s/p LE angio.  6/2 s/p R 2nd toe amputation. PMH includes CHF, COPD, htn, DM, neuromuscular disorder (slight numbness R toes-comes and goes), PVD, PSVT, skin CA, back surgery, L breast lumpectomy, CTR R, joint replacement.   Clinical Impression   Kathryn [name] was seen for OT evaluation this date. Prior to hospital admission, pt was MOD I for limited household mobility. Pt lives alone with family nearby and available PRN. Pt currently requires MAX A + RW pericare standing. MIN A don/doff surgical shoe in sitting. MIN A simulated BSC t/f. Pt would benefit from skilled OT to address noted impairments and functional limitations (see below for any additional details). Upon hospital discharge, recommend follow up therapy, will require assistance from family for all transfers and ADLs if pt returns home.   Recommendations for follow up therapy are one component of a multi-disciplinary discharge planning process, led by the attending physician.  Recommendations may be updated based on patient status, additional functional criteria and insurance authorization.   Assistance Recommended at Discharge Frequent or constant Supervision/Assistance  Patient can return home with the following A lot of help with walking and/or transfers;A lot of help with bathing/dressing/bathroom;Help with stairs or ramp for entrance    Functional Status Assessment  Patient has had a recent decline in their functional status and demonstrates the ability to make significant improvements in function in a reasonable and predictable amount of time.  Equipment  Recommendations  BSC/3in1;Other (comment) (2WW)    Recommendations for Other Services       Precautions / Restrictions Precautions Precautions: Fall Restrictions Weight Bearing Restrictions: Yes RLE Weight Bearing: Weight bearing as tolerated Other Position/Activity Restrictions: R LE Heel WB'ing in surgical shoe      Mobility Bed Mobility               General bed mobility comments: received and left sitting    Transfers Overall transfer level: Needs assistance Equipment used: Rolling walker (2 wheels) Transfers: Sit to/from Stand Sit to Stand: Min assist, Mod assist, +2 physical assistance           General transfer comment: posterior lean      Balance Overall balance assessment: Needs assistance Sitting-balance support: No upper extremity supported, Feet supported Sitting balance-Leahy Scale: Good     Standing balance support: Bilateral upper extremity supported Standing balance-Leahy Scale: Poor                             ADL either performed or assessed with clinical judgement   ADL Overall ADL's : Needs assistance/impaired                                       General ADL Comments: MAX A + RW pericare standing. MIN A don/doff surgical shoe in sitting. MIN A simulated BSC t/f      Pertinent Vitals/Pain Pain Assessment Pain Assessment: No/denies pain     Hand Dominance     Extremity/Trunk Assessment Upper Extremity Assessment Upper Extremity Assessment: Generalized weakness   Lower Extremity  Assessment Lower Extremity Assessment: Generalized weakness       Communication Communication Communication: HOH   Cognition Arousal/Alertness: Awake/alert Behavior During Therapy: WFL for tasks assessed/performed Overall Cognitive Status: Within Functional Limits for tasks assessed                                       General Comments  R foot dressings in place            Home Living  Family/patient expects to be discharged to:: Private residence Living Arrangements: Alone (lives in apt (side of son's house)) Available Help at Discharge: Family;Available PRN/intermittently (pt's son comes by every morning to check on pt and is in/out (as well as pt's sister's)) Type of Home: Apartment Home Access: Stairs to enter Entrance Stairs-Number of Steps: 3 Entrance Stairs-Rails: Right;Left;Can reach both (but is a stretch to reach both) Home Layout: One level     Bathroom Shower/Tub: Producer, television/film/video: Handicapped height     Home Equipment: Rollator (4 wheels);Wheelchair - manual;Shower seat;Grab bars - Chartered loss adjuster (2 wheels);Cane - single point   Additional Comments: Pt has a watch with fall detection.      Prior Functioning/Environment Prior Level of Function : Independent/Modified Independent;History of Falls (last six months)             Mobility Comments: ambulating with rollator within home; pushed in w/c for community distances; reports falls hx          OT Problem List: Decreased strength;Decreased range of motion;Decreased activity tolerance;Impaired balance (sitting and/or standing);Decreased safety awareness      OT Treatment/Interventions: Self-care/ADL training;Therapeutic exercise;Energy conservation;DME and/or AE instruction;Therapeutic activities;Balance training;Patient/family education    OT Goals(Current goals can be found in the care plan section) Acute Rehab OT Goals Patient Stated Goal: to go home OT Goal Formulation: With patient Time For Goal Achievement: 09/11/22 Potential to Achieve Goals: Good ADL Goals Pt Will Perform Grooming: with modified independence;sitting Pt Will Perform Lower Body Dressing: with modified independence;sit to/from stand Pt Will Transfer to Toilet: with modified independence;stand pivot transfer;bedside commode  OT Frequency: Min 2X/week    Co-evaluation               AM-PAC OT "6 Clicks" Daily Activity     Outcome Measure Help from another person eating meals?: None Help from another person taking care of personal grooming?: A Little Help from another person toileting, which includes using toliet, bedpan, or urinal?: A Lot Help from another person bathing (including washing, rinsing, drying)?: A Lot Help from another person to put on and taking off regular upper body clothing?: None Help from another person to put on and taking off regular lower body clothing?: A Lot 6 Click Score: 17   End of Session    Activity Tolerance: Patient tolerated treatment well Patient left: in chair;with call bell/phone within reach;with chair alarm set  OT Visit Diagnosis: Other abnormalities of gait and mobility (R26.89);Muscle weakness (generalized) (M62.81)                Time: 1610-9604 OT Time Calculation (min): 12 min Charges:  OT General Charges $OT Visit: 1 Visit OT Evaluation $OT Eval Moderate Complexity: 1 Mod  Kathie Dike, M.S. OTR/L  08/28/22, 10:30 AM  ascom [phone]

## 2022-08-28 NOTE — Assessment & Plan Note (Signed)
Clinically appears euvolemic.  BNP at 123 with history of CKD stage IV. Prior echocardiogram with EF of 35 to 40%. -Resuming home torsemide today -Monitor volume status closely as patient is being given IV fluid

## 2022-08-28 NOTE — Progress Notes (Signed)
Progress Note   Patient: Kathryn [name] ZOX:[id number] DOB: 04-Jul-1933 DOA: 08/22/2022     6 DOS: the patient was seen and examined on 08/28/2022   Brief hospital course: Taken from H&P.  PEARL CLAMP is a 89 y.o. female with medical history significant of known osteomyelitis of the right second toe based on MRI that is in the system from early May of this year.  Patient has been following wound care, worsening fatigue, loss of appetite and sleeping most of the time so she was evaluated at outpatient clinic and found to be hypotensive, she was advised to come to ER.  In ED her blood pressure was 84/48, labs pertinent for lactic acid of 3.2>>4.9, BUN 66, creatinine 1.53, baseline seems to be around 1.2 , potassium 5.2,  CT of abdomen and pelvis was negative for any acute abnormality.  Lower extremity venous Doppler studies were negative for DVT, foot x-ray with findings of known osteomyelitis of right second toe.  Patient received IV fluid with improvement of blood pressure.  Started on vancomycin and Zosyn.  5/30: Vital normal.  Labs with elevated ESR at 60, CRP normal, BNP 126, preliminary blood cultures negative in 12 hours, lactic acidosis resolved.  Anemia panel with anemia of chronic disease, B12 pending, CBC with worsening leukocytosis at 15.5 and hemoglobin decreased to 7.8, significant change without any obvious bleeding we will repeat CBC. Lactic acidosis can be due to metformin.  Patient was on torsemide and dapagliflozin at home which can cause volume depletion specially with poor p.o. intake. Patient and family eventually decided to proceed with second toe amputation.  No obvious sign of infection surrounding chronic wound.  Discontinuing IV antibiotics. Patient most likely will need vascular procedure before proceeding with amputation. Vascular surgery and podiatry both are on board.  5/31: Vital stable, overnight assisted fall on knees when her legs gave away on the way back from  restroom with the help of CNA, small knee abrasions which were treated.  Patient is going for lower extremity angiography and possible intervention with vascular surgery today. Hemoglobin and creatinine improving.  6/1: Hemodynamically stable.  Lower extremity angiography yesterday with 90% stenosis of bilateral common iliac arteries s/p PCI, 60 to 70% stenosis in the right SFA, left renal artery with limited flow.  Going for second toe amputation tomorrow.  6/2: Remains stable.  Significant improvement in bilateral lower extremity pain and now able to ambulate better after vascular procedure.  Going for second toe amputation today..  6/3: Hemodynamically stable.  S/p second right toe amputation on 08/26/2022.  Hemoglobin at 7.4 this morning.  After discussing with patient decided to proceed with 1 unit of PRBC transfusion.  Patient will be weightbearing on right heel and need to have a close follow-up with podiatry for further recommendations.  Pending PT/OT evaluation.  6/4: Remains stable, hemoglobin improved to 8.6 s/p 1 unit of PRBC.  PT recommending SNF.  TOC is working on it  Assessment and Plan: * Hypotension due to hypovolemia Resolved.   Likely hypotension with hypovolemia, blood pressure responded very well to IV fluid.  Recent poor p.o. intake and she continued to take her diuretic and Farxiga. Preliminary blood cultures negative.  No obvious sign of infection. History of chronic toe osteomyelitis.  She was started on broad-spectrum antibiotics which are discontinued.  Currently sepsis ruled out. -Encourage p.o. hydration after the procedure -Continue to monitor  Lactic acidosis Most likely secondary to dehydration and metformin. Resolved with IV fluid. -Consider discontinuing  metformin on discharge   Toe osteomyelitis, right (HCC) Right second DIP joint .  Patient already has an MRI of the site from early May.  Patient has a chronic wound which is being taken care of at wound  care center, no obvious sign of infection surrounding that wound.  Patient takes doxycycline at home.  Was given broad-spectrum antibiotics with Zosyn and vancomycin. Chronically elevated ESR with normal CRP. Vascular was also consulted for vascular clearance before amputation s/p angioplasty. -Broad-spectrum antibiotics were discontinued -Will hold all antibiotics for now -S/p second toe amputation by podiatry on 08/26/2022 -PT is recommending SNF  Hyperkalemia, diminished renal excretion Resolved with a dose of Lokelma.  Patient has stage IV renal disease. -Continue to monitor  Essential hypertension Patient is on carvedilol, losartan and torsemide at home. -Holding home losartan and torsemide since admission -Will start torsemide due to slight worsening of lower extremity edema -Continue to monitor-can resume if needed  Normocytic anemia Hemoglobin 8.6 s/p 1 unit of PRBC, baseline appears to be around 8.7. 9.4 on admission most likely some hemoconcentration with dehydration. Anemia panel with anemia of chronic disease and vitamin B12 deficiency. -Starting on B12 supplement -Weekly Aranesp ordered  Diabetes mellitus type 2, uncomplicated (HCC) Patient was on metformin and Farxiga at home. -SSI  Elevated erythrocyte sedimentation rate Most likely secondary to chronic osteomyelitis.  CRP normal  Hypothyroidism -Continue home Synthroid -Thyroid panel pending  CAD (coronary artery disease) Holding home aspirin for anticipated surgery. -Continue carvedilol and Lipitor  HFrEF (heart failure with reduced ejection fraction) (HCC) Clinically appears euvolemic.  BNP at 123 with history of CKD stage IV. Prior echocardiogram with EF of 35 to 40%. -Resuming home torsemide today -Monitor volume status closely as patient is being given IV fluid  Paroxysmal atrial fibrillation (HCC) Not on anticoagulation at home. -Continue home amiodarone and carvedilol  FTT (failure to thrive) in  adult Improving.  Palliative care was also consulted.  Obesity with body mass index of 30.0-39.9 Estimated body mass index is 32.2 kg/m as calculated from the following:   Height as of this encounter: 5\' 4"  (1.626 m).   Weight as of this encounter: 85.1 kg.     GERD without esophagitis -Continue home Protonix   Subjective: Patient was sitting comfortably in chair when seen today.  She was quite upset that she could not work good enough with PT and need to go to rehab, stating that although she does not want to but I think that is appropriate as family cannot be with her 24/7.  Physical Exam: Vitals:   08/27/22 1848 08/27/22 2054 08/28/22 0433 08/28/22 0908  BP: 134/62 138/65 127/73 (!) 125/57  Pulse: 70 64 75 73  Resp: 18 20 18 18   Temp: 97.9 F (36.6 C) 98.1 F (36.7 C) 97.8 F (36.6 C) 97.8 F (36.6 C)  TempSrc: Oral Oral Oral   SpO2: 97% 98% 98% 97%  Weight:      Height:       General.  Frail elderly lady, in no acute distress. Pulmonary.  Lungs clear bilaterally, normal respiratory effort. CV.  Regular rate and rhythm, no JVD, rub or murmur. Abdomen.  Soft, nontender, nondistended, BS positive. CNS.  Alert and oriented .  No focal neurologic deficit. Extremities.  1+LLE edema, no cyanosis, pulses intact and symmetrical.  Right foot and lower leg with Ace wrap Psychiatry.  Judgment and insight appears normal.     Data Reviewed: Prior data reviewed  Family Communication: Talked with son  on phone  Disposition: Status is: Inpatient Remains inpatient appropriate because: Severity of illness.  Likely need toe amputation.  Planned Discharge Destination: Home with Home Health  DVT prophylaxis.  Lovenox Time spent: 40 minutes  This record has been created using Conservation officer, historic buildings. Errors have been sought and corrected,but may not always be located. Such creation errors do not reflect on the standard of care.   Author: Arnetha Courser, MD 08/28/2022 2:39  PM  For on call review [email or web address].

## 2022-08-28 NOTE — [place] FL2 (Signed)
[place] MEDICAID FL2 LEVEL OF CARE FORM     IDENTIFICATION  Patient Name: Kathryn [name] Birthdate: Dec 24, 1933 Sex: female Admission Date (Current Location): 08/22/2022  [facility] and [place] Number:  Chiropodist and Address:  [facility], [address], [place], [place] [address]      Provider Number: [id number]  Attending Physician Name and Address:  Arnetha Courser, MD  Relative Name and Phone Number:       Current Level of Care: Hospital Recommended Level of Care: Skilled Nursing Facility Prior Approval Number:    Date Approved/Denied:   PASRR Number: [id number]  Discharge Plan: SNF    Current Diagnoses: Patient Active Problem List   Diagnosis Date Noted   Weakness 08/23/2022   Dehydration 08/23/2022   Foot infection 08/23/2022   Hypotension due to hypovolemia 08/22/2022   FTT (failure to thrive) in adult 08/22/2022   Toe osteomyelitis, right (HCC) 08/22/2022   Hypotension 08/22/2022   Hyperkalemia, diminished renal excretion 08/22/2022   Lactic acidosis 08/22/2022   HFrEF (heart failure with reduced ejection fraction) (HCC) 07/12/2022   COPD with acute exacerbation (HCC) 03/22/2022   Paroxysmal atrial fibrillation (HCC) 03/22/2022   Type 2 diabetes mellitus with peripheral neuropathy (HCC) 03/22/2022   Dyslipidemia 03/22/2022   GERD without esophagitis 03/22/2022   Lymphedema 03/20/2022   Acute renal failure superimposed on stage 3a chronic kidney disease (HCC) 03/13/2022   HLD (hyperlipidemia) 03/13/2022   CAD (coronary artery disease) 03/13/2022   Obesity with body mass index of 30.0-39.9 03/13/2022   Atherosclerosis of native arteries of the extremities with ulceration (HCC) 01/29/2022   Leg wound, right 01/16/2022   Normocytic anemia 12/21/2020   On amiodarone therapy 08/24/2020   Acute on chronic combined systolic and diastolic CHF (congestive heart failure) (HCC) 06/06/2020   PSVT (paroxysmal supraventricular  tachycardia)    AKI (acute kidney injury) (HCC) 05/26/2020   Aortic atherosclerosis (HCC) 05/25/2020   Acute on chronic respiratory failure with hypoxia (HCC) 05/24/2020   COPD exacerbation (HCC) 05/24/2020   Diabetes mellitus without complication (HCC) 05/24/2020   Acute CHF (HCC) 03/27/2020   Acute CHF (congestive heart failure) (HCC) 03/27/2020   Acute respiratory failure with hypoxia (HCC)    Diabetes mellitus type 2, uncomplicated (HCC) 08/09/2016   COPD, mild (HCC) 09/17/2014   Clinical depression 09/17/2014   Elevated platelet count 09/17/2014   Elevated erythrocyte sedimentation rate 09/17/2014   History of tobacco use 09/17/2014   Hypercholesteremia 09/17/2014   BP (high blood pressure) 09/17/2014   Hypothyroidism 09/17/2014   Cannot sleep 09/17/2014   Lumbar disc disease with radiculopathy 09/17/2014   Arthritis, degenerative 09/17/2014   Excess weight 09/17/2014   Vertebrobasilar circulation transient ischemic attack 09/17/2014   Lung nodule, multiple 09/17/2014   Basilar artery insufficiency 09/17/2014   Neuritis or radiculitis due to rupture of lumbar intervertebral disc 08/25/2013   Degenerative arthritis of lumbar spine 08/25/2013   Lumbar canal stenosis 08/25/2013   Smoker 07/15/2012   Back pain 07/15/2012   SOB (shortness of breath) 07/15/2012   Essential hypertension 07/15/2012    Orientation RESPIRATION BLADDER Height & Weight     Self, Time, Situation, Place  Normal Continent Weight: 184 lb 15.5 oz (83.9 kg) Height:  5\' 4"  (162.6 cm)  BEHAVIORAL SYMPTOMS/MOOD NEUROLOGICAL BOWEL NUTRITION STATUS   (None)  (None) Continent Diet (Carb modified)  AMBULATORY STATUS COMMUNICATION OF NEEDS Skin   Extensive Assist Verbally Skin abrasions, Surgical wounds (Incision on right foot: Gauze, compression wrap.)  Personal Care Assistance Level of Assistance  Bathing, Feeding, Dressing Bathing Assistance: Maximum assistance Feeding  assistance: Limited assistance Dressing Assistance: Maximum assistance     Functional Limitations Info  Sight, Hearing, Speech Sight Info: Adequate Hearing Info: Adequate Speech Info: Adequate    SPECIAL CARE FACTORS FREQUENCY  PT (By licensed PT), OT (By licensed OT)     PT Frequency: 5 x week OT Frequency: 5 x week            Contractures Contractures Info: Not present    Additional Factors Info  Code Status, Allergies Code Status Info: DNR Allergies Info: Oxycodone, Prednisone           Current Medications (08/28/2022):  This is the current hospital active medication list Current Facility-Administered Medications  Medication Dose Route Frequency Provider Last Rate Last Admin   acetaminophen (TYLENOL) tablet 650 mg  650 mg Oral Q6H PRN Nolberto Hanlon, MD   650 mg at 08/23/22 2001   Or   acetaminophen (TYLENOL) suppository 650 mg  650 mg Rectal Q6H PRN Nolberto Hanlon, MD       albuterol (PROVENTIL) (2.5 MG/3ML) 0.083% nebulizer solution 2.5 mg  2.5 mg Nebulization Q4H PRN Nolberto Hanlon, MD       amiodarone (PACERONE) tablet 100 mg  100 mg Oral Daily Nolberto Hanlon, MD   100 mg at 08/28/22 0901   aspirin EC tablet 81 mg  81 mg Oral Daily Annice Needy, MD   81 mg at 08/28/22 0901   atorvastatin (LIPITOR) tablet 40 mg  40 mg Oral q1800 Nolberto Hanlon, MD   40 mg at 08/27/22 1753   carvedilol (COREG) tablet 6.25 mg  6.25 mg Oral BID WC Arnetha Courser, MD   6.25 mg at 08/28/22 0900   clopidogrel (PLAVIX) tablet 75 mg  75 mg Oral Daily Annice Needy, MD   75 mg at 08/28/22 1610   cyanocobalamin (VITAMIN B12) injection 1,000 mcg  1,000 mcg Intramuscular Q0600 Arnetha Courser, MD   1,000 mcg at 08/28/22 0900   Darbepoetin Alfa (ARANESP) injection 25 mcg  25 mcg Subcutaneous Q7 days Arnetha Courser, MD   25 mcg at 08/23/22 2146   enoxaparin (LOVENOX) injection 40 mg  40 mg Subcutaneous Q24H Lowella Bandy, RPH   40 mg at 08/28/22 0901   Fe Fum-Vit C-Vit B12-FA (TRIGELS-F FORTE) capsule 1 capsule  1  capsule Oral QPC breakfast Arnetha Courser, MD   1 capsule at 08/28/22 0900   fentaNYL (SUBLIMAZE) injection 12.5 mcg  12.5 mcg Intravenous Once PRN Rolla Plate R, NP       gabapentin (NEURONTIN) capsule 400 mg  400 mg Oral TID Nolberto Hanlon, MD   400 mg at 08/28/22 0902   HYDROcodone-acetaminophen (NORCO/VICODIN) 5-325 MG per tablet 1 tablet  1 tablet Oral Q4H PRN Andris Baumann, MD   1 tablet at 08/27/22 2232   HYDROmorphone (DILAUDID) injection 1 mg  1 mg Intravenous Once PRN Rolla Plate R, NP       hydrOXYzine (ATARAX) tablet 10 mg  10 mg Oral TID PRN Nolberto Hanlon, MD   10 mg at 08/27/22 0124   insulin aspart (novoLOG) injection 0-5 Units  0-5 Units Subcutaneous QHS Nolberto Hanlon, MD       insulin aspart (novoLOG) injection 0-9 Units  0-9 Units Subcutaneous TID WC Nolberto Hanlon, MD   1 Units at 08/27/22 1753   ipratropium-albuterol (DUONEB) 0.5-2.5 (3) MG/3ML nebulizer solution 3 mL  3 mL Nebulization Q4H PRN Arnetha Courser,  MD       levothyroxine (SYNTHROID) tablet 100 mcg  100 mcg Oral Q0600 Nolberto Hanlon, MD   100 mcg at 08/28/22 0608   ondansetron [facility]) injection 4 mg  4 mg Intravenous Q4H PRN Nolberto Hanlon, MD   4 mg at 08/23/22 0047   pantoprazole (PROTONIX) EC tablet 40 mg  40 mg Oral Daily Nolberto Hanlon, MD   40 mg at 08/28/22 0902   sodium chloride flush (NS) 0.9 % injection 3 mL  3 mL Intravenous Q12H Nolberto Hanlon, MD   3 mL at 08/27/22 2233   tiotropium (SPIRIVA) inhalation capsule ([facility] use ONLY) 18 mcg  18 mcg Inhalation Daily Nolberto Hanlon, MD   18 mcg at 08/28/22 9147     Discharge Medications: Please see discharge summary for a list of discharge medications.  Relevant Imaging Results:  Relevant Lab Results:   Additional Information SS#: [id number]  Margarito Liner, LCSW

## 2022-08-28 NOTE — Brief Op Note (Signed)
08/26/2022  7:48 PM  PATIENT:  Kathryn [name]  89 y.o. female  PRE-OPERATIVE DIAGNOSIS:  Osteomyelitis  POST-OPERATIVE DIAGNOSIS:  Osteomyelitis  PROCEDURE:  Procedure(s): AMPUTATION 2ND  TOE RIGHT FOOT (Right)  SURGEON:  Surgeon(s) and Role:    Felecia Shelling, DPM - Primary  PHYSICIAN ASSISTANT:   ASSISTANTS: none   ANESTHESIA:   local and IV sedation  EBL:  minimal   BLOOD ADMINISTERED:none  DRAINS: none   LOCAL MEDICATIONS USED:  MARCAINE   , LIDOCAINE , and Amount: 10 ml  SPECIMEN:  No Specimen  DISPOSITION OF SPECIMEN:  N/A  COUNTS:  YES  TOURNIQUET:  * No tourniquets in log *  DICTATION: .Dragon Dictation  PLAN OF CARE: Admit to inpatient   PATIENT DISPOSITION:  PACU - hemodynamically stable.   Delay start of Pharmacological VTE agent (>24hrs) due to surgical blood loss or risk of bleeding: not applicable

## 2022-08-28 NOTE — Evaluation (Signed)
Physical Therapy Evaluation Patient Details Name: Kathryn [name] MRN: [id number] DOB: Mar 22, 1934 Today's Date: 08/28/2022  History of Present Illness  Pt is an 89 y.o. female presenting to hospital 08/22/22 with c/o weakness and hypotension.  Pt admitted with hypotension d/t hypovolemia, lactic acidosis, hyperkalemia, osteomyelitis R second DIP joint, and normocytic anemia.  6/1 s/p LE angio.  6/2 s/p R 2nd toe amputation. PMH includes CHF, COPD, htn, DM, neuromuscular disorder (slight numbness R toes-comes and goes), PVD, PSVT, skin CA, back surgery, L breast lumpectomy, CTR R, joint replacement.  Clinical Impression  Prior to hospital admission, pt reports being ambulatory household distances with rollator; uses manual w/c in community (mostly gets pushed in w/c); lives alone in apt (side of son's home) with 3 STE B railings.  No c/o pain during session.  Pt educated on WB'ing precautions.  OT arrived during session for part PT/OT co-evaluation.  Currently pt is modified independent semi-supine to sitting edge of bed; max assist to stand from bed up to RW x2 trials; min assist squat pivot transfer bed to recliner towards L side; min to mod assist x2 to stand from recliner up to RW; and pt unable to take any steps in standing with B UE support on RW and 2 assist (pt felt her L knee was going to buckle).  Pt able to maintain R LE heel WB'ing during sessions activities with intermittent cueing.  Pt would currently benefit from skilled PT to address noted impairments and functional limitations (see below for any additional details).  Upon hospital discharge, pt would benefit from ongoing therapy.    Recommendations for follow up therapy are one component of a multi-disciplinary discharge planning process, led by the attending physician.  Recommendations may be updated based on patient status, additional functional criteria and insurance authorization.  Follow Up Recommendations Can patient physically be  transported by private vehicle: No     Assistance Recommended at Discharge Frequent or constant Supervision/Assistance  Patient can return home with the following  Two people to help with walking and/or transfers;Assist for transportation;Help with stairs or ramp for entrance;A little help with bathing/dressing/bathroom    Equipment Recommendations Rolling walker (2 wheels);Wheelchair (measurements PT);Wheelchair cushion (measurements PT);BSC/3in1  Recommendations for Other Services  OT consult    Functional Status Assessment Patient has had a recent decline in their functional status and demonstrates the ability to make significant improvements in function in a reasonable and predictable amount of time.     Precautions / Restrictions Precautions Precautions: Fall Restrictions Weight Bearing Restrictions: Yes RLE Weight Bearing: Weight bearing as tolerated Other Position/Activity Restrictions: R LE Heel WB'ing in surgical shoe      Mobility  Bed Mobility Overal bed mobility: Modified Independent Bed Mobility: Supine to Sit     Supine to sit: Modified independent (Device/Increase time), HOB elevated     General bed mobility comments: no difficulties noted    Transfers Overall transfer level: Needs assistance Equipment used: Rolling walker (2 wheels) Transfers: Sit to/from Stand Sit to Stand: Min assist, Mod assist, +2 physical assistance           General transfer comment: x2 trials standing from bed with max assist x1; squat pivot transfer to L bed to recliner with min assist; min to mod assist x2 to stand from recliner up to RW; vc's for scooting forward to edge of sitting surface prior to standing; vc's for UE/LE positioning and WB'ing precautions; assist to initiate stand and control descent sitting  Ambulation/Gait Ambulation/Gait assistance:  (pt unable to take any steps with RW and 2 assist (pt reporting L knee was going to buckle))                 Stairs            Wheelchair Mobility    Modified Rankin (Stroke Patients Only)       Balance Overall balance assessment: Needs assistance Sitting-balance support: No upper extremity supported, Feet supported Sitting balance-Leahy Scale: Normal Sitting balance - Comments: steady sitting reaching outside BOS   Standing balance support: Bilateral upper extremity supported, Reliant on assistive device for balance Standing balance-Leahy Scale: Poor Standing balance comment: assist for balance in standing d/t posterior lean                             Pertinent Vitals/Pain Pain Assessment Pain Assessment: Faces Faces Pain Scale: No hurt Pain Location: R foot Pain Intervention(s): Limited activity within patient's tolerance, Monitored during session Vitals (HR and O2 on room air) stable and WFL throughout treatment session.    Home Living Family/patient expects to be discharged to:: Private residence Living Arrangements: Alone (lives in apt (side of son's house)) Available Help at Discharge: Family;Available PRN/intermittently (pt's son comes by every morning to check on pt and is in/out (as well as pt's sister's)) Type of Home: Apartment Home Access: Stairs to enter Entrance Stairs-Rails: Right;Left;Can reach both (but is a stretch to reach both) Entrance Stairs-Number of Steps: 3   Home Layout: One level Home Equipment: Rollator (4 wheels);Wheelchair - manual;Shower seat;Grab bars - Chartered loss adjuster (2 wheels);Cane - single point Additional Comments: Pt has a watch with fall detection.    Prior Function Prior Level of Function : Independent/Modified Independent;History of Falls (last six months)             Mobility Comments: ambulating with rollator within home; pushed in w/c for community distances; reports falls hx       Hand Dominance        Extremity/Trunk Assessment   Upper Extremity Assessment Upper Extremity Assessment:  Generalized weakness    Lower Extremity Assessment Lower Extremity Assessment: Generalized weakness       Communication   Communication: HOH  Cognition Arousal/Alertness: Awake/alert Behavior During Therapy: WFL for tasks assessed/performed Overall Cognitive Status: Within Functional Limits for tasks assessed                                          General Comments General comments (skin integrity, edema, etc.): R foot dressings in place.  Nursing cleared pt for participation in physical therapy.  Pt agreeable to PT session.    Exercises  Transfer training   Assessment/Plan    PT Assessment Patient needs continued PT services  PT Problem List Decreased strength;Decreased activity tolerance;Decreased balance;Decreased mobility;Decreased knowledge of use of DME;Decreased knowledge of precautions;Decreased skin integrity;Pain       PT Treatment Interventions DME instruction;Gait training;Stair training;Functional mobility training;Therapeutic activities;Therapeutic exercise;Balance training;Patient/family education;Wheelchair mobility training    PT Goals (Current goals can be found in the Care Plan section)  Acute Rehab PT Goals Patient Stated Goal: to go home PT Goal Formulation: With patient Time For Goal Achievement: 09/11/22 Potential to Achieve Goals: Good    Frequency Min 4X/week     Co-evaluation PT/OT/SLP Co-Evaluation/Treatment: Yes Reason for Co-Treatment: For  patient/therapist safety;To address functional/ADL transfers PT goals addressed during session: Mobility/safety with mobility;Balance;Proper use of DME OT goals addressed during session: ADL's and self-care       AM-PAC PT "6 Clicks" Mobility  Outcome Measure Help needed turning from your back to your side while in a flat bed without using bedrails?: None Help needed moving from lying on your back to sitting on the side of a flat bed without using bedrails?: None Help needed moving  to and from a bed to a chair (including a wheelchair)?: A Little Help needed standing up from a chair using your arms (e.g., wheelchair or bedside chair)?: Total Help needed to walk in hospital room?: Total Help needed climbing 3-5 steps with a railing? : Total 6 Click Score: 14    End of Session Equipment Utilized During Treatment: Gait belt Activity Tolerance: Patient limited by fatigue Patient left: in chair (with OT present (OT to set pt up end of OT session)) Nurse Communication: Mobility status;Precautions;Weight bearing status PT Visit Diagnosis: Other abnormalities of gait and mobility (R26.89);Muscle weakness (generalized) (M62.81);History of falling (Z91.81);Pain Pain - Right/Left: Right Pain - part of body: Ankle and joints of foot    Time: 1191-4782 PT Time Calculation (min) (ACUTE ONLY): 28 min   Charges:   PT Evaluation $PT Eval Low Complexity: 1 Low PT Treatments $Therapeutic Activity: 8-22 mins       Hendricks Limes, PT 08/28/22, 10:58 AM

## 2022-08-28 NOTE — Care Management Important Message (Signed)
Important Message  Patient Details  Name: Kathryn [name] MRN: [id number] Date of Birth: 1933/11/14   Medicare Important Message Given:  Yes     Johnell Comings 08/28/2022, 11:14 AM

## 2022-08-28 NOTE — Assessment & Plan Note (Signed)
Improving.  Palliative care was also consulted.

## 2022-08-28 NOTE — Op Note (Addendum)
OPERATIVE REPORT Patient name: Kathryn [name] MRN: [id number] DOB: January 02, 1934  DOS: 08/28/22  Preop Dx: Osteomyelitis right second toe Postop Dx: same  Procedure:  1.  Amputation right second toe  Surgeon: Felecia Shelling DPM  Anesthesia: 50-50 mixture of 2% lidocaine plain with 0.5% Marcaine plain totaling 10 mL infiltrated in the patient's right foot in a digital block fashion  Hemostasis: Calf tourniquet inflated to a pressure of after esmarch exsanguination   EBL: Minimal mL Materials: None Injectables: None Pathology: None  Condition: The patient tolerated the procedure and anesthesia well. No complications noted or reported   Justification for procedure: The patient is a 89 y.o. who presents today for surgical correction of osteomyelitis right second toe.  Patient has failed conservative treatment.  Risk benefits advantages disadvantages discussed. The patient consented for surgical correction. The patient consent form was reviewed. All patient questions were answered. No guarantees were expressed or implied. The patient and the surgeon both signed the patient consent form with the witness present and placed in the patient's chart.   Procedure in Detail: The patient was brought to the operating room, placed in the operating table in the supine position at which time an aseptic scrub and drape were performed about the patient's respective lower extremity after anesthesia was induced as described above. Attention was then directed to the surgical area where procedure number one commenced.  Procedure #1: Amputation right second toe A fishmouth type incision was planned and made around the second MTP of the right foot.  Incision was carried down to the level of bone and joint capsule using a #10 scalpel.  The toe was grasped with a perforating towel clamp and distracted distally and disarticulated at the level of the MTP joint.  The toe was removed in toto and placed in  sterile specimen container.  The extensor and flexor hallucis longus tendons were distracted and cut as far proximal as could be visualized.  Clean margins.  Negative for any significant purulence or nonviable necrotic tissue after amputation.  Copious irrigation was then utilized in preparation for primary closure.  4-0 nylon suture was utilized to reapproximate superficial skin edges.  Dry sterile compressive dressings were then applied to all previously mentioned incision sites about the patient's lower extremity. The tourniquet which was used for hemostasis was deflated. All normal neurovascular responses including pink color and warmth returned all the digits of patient's lower extremity.  The patient was then transferred from the operating room to the recovery room having tolerated the procedure and anesthesia well. All vital signs are stable. After a brief stay in the recovery room the patient was readmitted to inpatient room.  Dressings clean dry and intact.  Weightbearing as tolerated in a cam boot.  IMPRESSION: Clean margins  Dry sterile compressive dressings were then applied to all previously mentioned incision sites about the patient's lower extremity. The tourniquet which was used for hemostasis was deflated. All normal neurovascular responses including pink color and warmth returned all the digits of patient's lower extremity.  The patient was then transferred from the operating room to the recovery room having tolerated the procedure and anesthesia well. All vital signs are stable. After a brief stay in the recovery room the patient was readmitted to inpatient room.  Keep dressings clean dry and intact.    SURGICAL IMPRESSION: Clean margins  Felecia Shelling, DPM [facility]  Dr. Felecia Shelling, DPM    [address]  [place], [place] [address]                Office [phone]  Fax [phone]

## 2022-08-28 NOTE — TOC Initial Note (Signed)
[facility]) - Initial/Assessment Note    Patient Details  Name: Kathryn [name] MRN: [id number] Date of Birth: 02/04/1934  [facility]) CM/SW Contact:    Margarito Liner, LCSW Phone Number: 08/28/2022, 11:15 AM  Clinical Narrative:  CSW met with patient. No supports at bedside. CSW introduced role and explained that therapy recommendations would be discussed. Patient is agreeable to SNF placement. [facility] is first preference. CSW left message for admissions coordinator asking her to review referral. Patient has discussed plan with family. No further concerns. CSW encouraged patient to contact CSW as needed. CSW will continue to follow patient for support and facilitate discharge to SNF once medically stable.                Expected Discharge Plan: Skilled Nursing Facility Barriers to Discharge: Insurance Authorization, SNF Pending bed offer   Patient Goals and CMS Choice   CMS Medicare.gov Compare Post Acute Care list provided to:: Patient        Expected Discharge Plan and Services     Post Acute Care Choice: Skilled Nursing Facility Living arrangements for the past 2 months: Single Family Home                                      Prior Living Arrangements/Services Living arrangements for the past 2 months: Single Family Home Lives with:: Self Patient language and need for interpreter reviewed:: Yes Do you feel safe going back to the place where you live?: Yes      Need for Family Participation in Patient Care: Yes (Comment)     Criminal Activity/Legal Involvement Pertinent to Current Situation/Hospitalization: No - Comment as needed  Activities of Daily Living Home Assistive Devices/Equipment: Dan Humphreys (specify type) ADL Screening (condition at time of admission) Patient's cognitive ability adequate to safely complete daily activities?: Yes Is the patient deaf or have difficulty hearing?: No Does the patient have difficulty seeing,  even when wearing glasses/contacts?: No Does the patient have difficulty concentrating, remembering, or making decisions?: Yes Patient able to express need for assistance with ADLs?: Yes Does the patient have difficulty dressing or bathing?: No Independently performs ADLs?: Yes (appropriate for developmental age) Does the patient have difficulty walking or climbing stairs?: Yes Weakness of Legs: Both Weakness of Arms/Hands: Both  Permission Sought/Granted Permission sought to share information with : Facility Industrial/product designer granted to share information with : Yes, Verbal Permission Granted     Permission granted to share info w AGENCY: SNF's        Emotional Assessment Appearance:: Appears stated age Attitude/Demeanor/Rapport: Engaged, Gracious Affect (typically observed): Accepting, Appropriate, Calm, Pleasant Orientation: : Oriented to Self, Oriented to Place, Oriented to  Time, Oriented to Situation Alcohol / Substance Use: Not Applicable Psych Involvement: No (comment)  Admission diagnosis:  Dehydration [E86.0] Weakness [R53.1] Foot infection [L08.9] AKI (acute kidney injury) (HCC) [N17.9] Hypotension [I95.9] Patient Active Problem List   Diagnosis Date Noted   Weakness 08/23/2022   Dehydration 08/23/2022   Foot infection 08/23/2022   Hypotension due to hypovolemia 08/22/2022   FTT (failure to thrive) in adult 08/22/2022   Toe osteomyelitis, right (HCC) 08/22/2022   Hypotension 08/22/2022   Hyperkalemia, diminished renal excretion 08/22/2022   Lactic acidosis 08/22/2022   HFrEF (heart failure with reduced ejection fraction) (HCC) 07/12/2022   COPD with acute exacerbation (HCC) 03/22/2022   Paroxysmal atrial fibrillation (HCC)  03/22/2022   Type 2 diabetes mellitus with peripheral neuropathy (HCC) 03/22/2022   Dyslipidemia 03/22/2022   GERD without esophagitis 03/22/2022   Lymphedema 03/20/2022   Acute renal failure superimposed on stage 3a chronic  kidney disease (HCC) 03/13/2022   HLD (hyperlipidemia) 03/13/2022   CAD (coronary artery disease) 03/13/2022   Obesity with body mass index of 30.0-39.9 03/13/2022   Atherosclerosis of native arteries of the extremities with ulceration (HCC) 01/29/2022   Leg wound, right 01/16/2022   Normocytic anemia 12/21/2020   On amiodarone therapy 08/24/2020   Acute on chronic combined systolic and diastolic CHF (congestive heart failure) (HCC) 06/06/2020   PSVT (paroxysmal supraventricular tachycardia)    AKI (acute kidney injury) (HCC) 05/26/2020   Aortic atherosclerosis (HCC) 05/25/2020   Acute on chronic respiratory failure with hypoxia (HCC) 05/24/2020   COPD exacerbation (HCC) 05/24/2020   Diabetes mellitus without complication (HCC) 05/24/2020   Acute CHF (HCC) 03/27/2020   Acute CHF (congestive heart failure) (HCC) 03/27/2020   Acute respiratory failure with hypoxia (HCC)    Diabetes mellitus type 2, uncomplicated (HCC) 08/09/2016   COPD, mild (HCC) 09/17/2014   Clinical depression 09/17/2014   Elevated platelet count 09/17/2014   Elevated erythrocyte sedimentation rate 09/17/2014   History of tobacco use 09/17/2014   Hypercholesteremia 09/17/2014   BP (high blood pressure) 09/17/2014   Hypothyroidism 09/17/2014   Cannot sleep 09/17/2014   Lumbar disc disease with radiculopathy 09/17/2014   Arthritis, degenerative 09/17/2014   Excess weight 09/17/2014   Vertebrobasilar circulation transient ischemic attack 09/17/2014   Lung nodule, multiple 09/17/2014   Basilar artery insufficiency 09/17/2014   Neuritis or radiculitis due to rupture of lumbar intervertebral disc 08/25/2013   Degenerative arthritis of lumbar spine 08/25/2013   Lumbar canal stenosis 08/25/2013   Smoker 07/15/2012   Back pain 07/15/2012   SOB (shortness of breath) 07/15/2012   Essential hypertension 07/15/2012   PCP:  Ronnald Ramp, MD Pharmacy:   [facility] [phone] [place] [address], [place] - [address] AT [facility] [address] [place] [address] Phone: [phone] Fax: [phone]     Social Determinants of Health (SDOH) Social History: SDOH Screenings   Food Insecurity: No Food Insecurity (08/23/2022)  Housing: Low Risk  (08/23/2022)  Transportation Needs: No Transportation Needs (08/23/2022)  Utilities: Not At Risk (08/23/2022)  Alcohol Screen: Low Risk  (08/13/2022)  Depression (PHQ2-9): High Risk (08/22/2022)  Financial Resource Strain: Low Risk  (08/13/2022)  Physical Activity: Inactive (08/13/2022)  Social Connections: Moderately Isolated (08/13/2022)  Stress: No Stress Concern Present (08/13/2022)  Tobacco Use: Medium Risk (08/27/2022)   SDOH Interventions:     Readmission Risk Interventions    08/28/2022   11:14 AM 05/27/2020   12:12 PM  Readmission Risk Prevention Plan  Transportation Screening  Complete  PCP or Specialist Appt within 5-7 Days  Complete  PCP or Specialist Appt within 3-5 Days Complete   Home Care Screening  Complete  Medication Review (RN CM)  Complete  Social Work Consult for Recovery Care Planning/Counseling Complete   Palliative Care Screening Not Applicable

## 2022-08-29 ENCOUNTER — Telehealth: Payer: Self-pay | Admitting: Podiatry

## 2022-08-29 DIAGNOSIS — N179 Acute kidney failure, unspecified: Secondary | ICD-10-CM | POA: Diagnosis not present

## 2022-08-29 DIAGNOSIS — R531 Weakness: Secondary | ICD-10-CM | POA: Diagnosis not present

## 2022-08-29 DIAGNOSIS — E861 Hypovolemia: Secondary | ICD-10-CM | POA: Diagnosis not present

## 2022-08-29 DIAGNOSIS — M869 Osteomyelitis, unspecified: Secondary | ICD-10-CM

## 2022-08-29 LAB — CBC
HCT: 31 % — ABNORMAL LOW (ref 36.0–46.0)
Hemoglobin: 8.9 g/dL — ABNORMAL LOW (ref 12.0–15.0)
MCH: 22.8 pg — ABNORMAL LOW (ref 26.0–34.0)
MCHC: 28.7 g/dL — ABNORMAL LOW (ref 30.0–36.0)
MCV: 79.5 fL — ABNORMAL LOW (ref 80.0–100.0)
Platelets: 209 10*3/uL (ref 150–400)
RBC: 3.9 MIL/uL (ref 3.87–5.11)
RDW: 21.2 % — ABNORMAL HIGH (ref 11.5–15.5)
WBC: 6 10*3/uL (ref 4.0–10.5)
nRBC: 0.5 % — ABNORMAL HIGH (ref 0.0–0.2)

## 2022-08-29 LAB — BASIC METABOLIC PANEL
Anion gap: 6 (ref 5–15)
BUN: 18 mg/dL (ref 8–23)
CO2: 26 mmol/L (ref 22–32)
Calcium: 8.2 mg/dL — ABNORMAL LOW (ref 8.9–10.3)
Chloride: 108 mmol/L (ref 98–111)
Creatinine, Ser: 0.92 mg/dL (ref 0.44–1.00)
GFR, Estimated: >60 mL/min (ref >60)
Glucose, Bld: 100 mg/dL — ABNORMAL HIGH (ref 70–99)
Potassium: 3.7 mmol/L (ref 3.5–5.1)
Sodium: 140 mmol/L (ref 135–145)

## 2022-08-29 LAB — GLUCOSE, CAPILLARY
Glucose-Capillary: 100 mg/dL — ABNORMAL HIGH (ref 70–99)
Glucose-Capillary: 131 mg/dL — ABNORMAL HIGH (ref 70–99)
Glucose-Capillary: 123 mg/dL — ABNORMAL HIGH (ref 70–99)

## 2022-08-29 NOTE — TOC Progression Note (Addendum)
Transition of Care [facility]) - Progression Note    Patient Details  Name: Kathryn [name] MRN: [id number] Date of Birth: 07-14-1933  [facility]) CM/SW Contact  Margarito Liner, LCSW Phone Number: 08/29/2022, 10:31 AM  Clinical Narrative:  Patient wants to see if [facility] can offer a bed. Admissions coordinator is reviewing now. If they cannot offer, patient will accept bed offer from [facility].   10:58 am: Patient has accepted bed offer from [facility]. They will have a bed on Friday. Per CMA, Monia Pouch has been taking about two days for insurance authorization decisions. Left message for [facility] admissions coordinator to see if she wants to start auth or have our CMA start it.  11:10 am: CSW asked CMA to start auth.  11:45 am: Berkley Harvey has been started. Pending cert: [id number].   Expected Discharge Plan: Skilled Nursing Facility Barriers to Discharge: English as a second language teacher, SNF Pending bed offer  Expected Discharge Plan and Services     Post Acute Care Choice: Skilled Nursing Facility Living arrangements for the past 2 months: Single Family Home                                       Social Determinants of Health (SDOH) Interventions SDOH Screenings   Food Insecurity: No Food Insecurity (08/23/2022)  Housing: Low Risk  (08/23/2022)  Transportation Needs: No Transportation Needs (08/23/2022)  Utilities: Not At Risk (08/23/2022)  Alcohol Screen: Low Risk  (08/13/2022)  Depression (PHQ2-9): High Risk (08/22/2022)  Financial Resource Strain: Low Risk  (08/13/2022)  Physical Activity: Inactive (08/13/2022)  Social Connections: Moderately Isolated (08/13/2022)  Stress: No Stress Concern Present (08/13/2022)  Tobacco Use: Medium Risk (08/27/2022)    Readmission Risk Interventions    08/28/2022   11:14 AM 05/27/2020   12:12 PM  Readmission Risk Prevention Plan  Transportation Screening  Complete  PCP or Specialist Appt within 5-7 Days  Complete   PCP or Specialist Appt within 3-5 Days Complete   Home Care Screening  Complete  Medication Review (RN CM)  Complete  Social Work Consult for Recovery Care Planning/Counseling Complete   Palliative Care Screening Not Applicable

## 2022-08-29 NOTE — Telephone Encounter (Signed)
Pt's son stated that mom is in the hospital & will be moving to [facility] on Friday. He wanted to know what needs to be done to make sure she is healing properly from Sx. Please advise

## 2022-08-29 NOTE — Progress Notes (Signed)
Progress Note   Patient: Kathryn [name] YNW:[id number] DOB: [birth year] DOA: 08/22/2022     7 DOS: the patient was seen and examined on 08/29/2022   Brief hospital course: Kathryn [name] is a 89 y.o. female with medical history significant of Type II DM, HTN, normocytic anemia, hypothyroidism, CAD, PAF, GERD, COPD, HLD, tobacco user, depression, arthritis, lung nodule, basilar artery insufficiency, lumbar canal stenosis, CHF, lumbar radiculopathy, and known osteomyelitis of the right second toe based on MRI from May of this year.  Patient has been following wound care. They presented to ED after outpatient evaluation for worsening fatigue, loss of appetite and sleeping most of the time. She was found to be hypotensive at that appointment.   In ED her blood pressure was 84/48, labs pertinent for lactic acid of 3.2>>4.9, BUN 66, creatinine 1.53, baseline seems to be around 1.2 , potassium 5.2,  CT of abdomen and pelvis was negative for any acute abnormality.  Lower extremity venous Doppler studies were negative for DVT, foot x-ray with findings of known osteomyelitis of right second toe.   Patient received IV fluid with improvement of blood pressure.  Started on vancomycin and Zosyn. Vascular surgery and podiatry both consulted.   5/30: hemodynamically stable   5/31: lower extremity angiography and possible intervention with vascular surgery today. Hemoglobin and creatinine improving.   6/1: Hemodynamically stable.  Lower extremity angiography yesterday with 90% stenosis of bilateral common iliac arteries s/p PCI, 60 to 70% stenosis in the right SFA, left renal artery with limited flow.  Going for second toe amputation tomorrow.   6/2: Remains stable.  Significant improvement in bilateral lower extremity pain and now able to ambulate better after vascular procedure.  Going for second toe amputation today..   6/3: Hemodynamically stable.  S/p second right toe amputation. Hemoglobin at 7.4 this  morning.  After discussing with patient decided to proceed with 1 unit of PRBC transfusion.  Patient will be weightbearing on right heel and need to have a close follow-up with podiatry for further recommendations.  Pending PT/OT evaluation.   6/4-6/5: remains stable post-op. Incision healing well. Pain and swelling in legs greatly improved. Has a bed at SNF and is awaiting insurance auth. Expected dc 6/8.   Assessment and Plan: Hypotension due to hypovolemia- Resolved.    Toe osteomyelitis, right (HCC)- s/p amputation 6/2 Right second DIP joint . Was given broad-spectrum antibiotics with Zosyn and vancomycin. S/p angioplasty PCI 5/31 -Broad-spectrum antibiotics were discontinued -PT is recommending SNF- awaiting insurance auth.   Hyperkalemia- resolved -Continue to monitor  Essential hypertension  CAD  HFrEF  HLD  PAF- Prior echocardiogram with EF of 35 to 40%. Rate controlled and asymptomatic. Not on anticoagulation chronically. - continue home amiodarone, carvedilol, losartan and torsemide, lipitor  Normocytic anemia- s/p 1 unit of PRBC, baseline appears to be around 8.7. currently hgb 8.9 and no acute bleeding. Transfusion threshold <8 - monitor  Diabetes mellitus type 2, uncomplicated (HCC) Patient was on metformin and Farxiga at home. -SSI  Hypothyroidism -Continue home Synthroid  FTT (failure to thrive) in adult Improving.  Palliative care was also consulted.  Obesity with body mass index of 30.0-39.9 Estimated body mass index is 32.2 kg/m as calculated from the following:   Height as of this encounter: 5\' 4"  (1.626 m).   Weight as of this encounter: 85.1 kg.   GERD without esophagitis -Continue home Protonix  Subjective: Patient reports no complaints. States that her foot pain and swelling is minimal. She  has been ambulating with weight-bearing on her heal. Ready to go to SNF.   Physical Exam: Vitals:   08/28/22 0908 08/28/22 1656 08/28/22 2252 08/29/22 0510   BP: (!) 125/57 129/75 (!) 114/52 (!) 150/70  Pulse: 73 77 66 69  Resp: 18 18 19 17   Temp: 97.8 F (36.6 C) 97.9 F (36.6 C) 97.7 F (36.5 C) 98.4 F (36.9 C)  TempSrc:   Oral   SpO2: 97% 97% 96% 97%  Weight:      Height:       General. alert, in no acute distress. Pulmonary.  Lungs clear bilaterally, normal respiratory effort. CV.  Regular rate and rhythm, no JVD, rub or murmur. CNS.  Alert and oriented .  No focal neurologic deficit. Extremities.  no edema, no cyanosis, pulses intact and symmetrical. Right foot and lower leg with Ace wrap- removed bandage and wound approximated well without signs of infection. Good circulation and ROM in remaining foot.  Psychiatry.  Judgment and insight appears normal.     Data Reviewed: Prior data reviewed  Family Communication: son on phone with patient during encounter.   Disposition: Status is: Inpatient Remains inpatient appropriate because: awaiting SNF placement  Planned Discharge Destination: SNF  DVT prophylaxis.  Lovenox Time spent: 45 minutes  Author: Leeroy Bock, MD 08/29/2022 7:49 AM  For on call review [email or web address].

## 2022-08-29 NOTE — Progress Notes (Signed)
Physical Therapy Treatment Patient Details Name: Kathryn [name] MRN: [id number] DOB: 08-03-1933 Today's Date: 08/29/2022   History of Present Illness Pt is an 89 y.o. female presenting to hospital 08/22/22 with c/o weakness and hypotension.  Pt admitted with hypotension d/t hypovolemia, lactic acidosis, hyperkalemia, osteomyelitis R second DIP joint, and normocytic anemia.  6/1 s/p LE angio.  6/2 s/p R 2nd toe amputation. PMH includes CHF, COPD, htn, DM, neuromuscular disorder (slight numbness R toes-comes and goes), PVD, PSVT, skin CA, back surgery, L breast lumpectomy, CTR R, joint replacement.    PT Comments    Patient making progress towards physical therapy goals. Patient agreeable to therapy session. Assistance required to don surgical shoe. Able to complete sit to stand with min guard. Ambulated 12' with min guard and RW, LOB x 1 requiring minA to recover but otherwise slow steady gait. Good adherence to heel WB on R throughout session. Discharge plan remains appropriate.     Recommendations for follow up therapy are one component of a multi-disciplinary discharge planning process, led by the attending physician.  Recommendations may be updated based on patient status, additional functional criteria and insurance authorization.  Follow Up Recommendations  Can patient physically be transported by private vehicle: No    Assistance Recommended at Discharge Frequent or constant Supervision/Assistance  Patient can return home with the following Assist for transportation;Help with stairs or ramp for entrance;A little help with bathing/dressing/bathroom;A little help with walking and/or transfers;Assistance with cooking/housework   Equipment Recommendations  Rolling Kalonji Zurawski (2 wheels);BSC/3in1    Recommendations for Other Services       Precautions / Restrictions Precautions Precautions: Fall Restrictions Weight Bearing Restrictions: Yes RLE Weight Bearing: Weight bearing as  tolerated Other Position/Activity Restrictions: R LE Heel WB'ing in surgical shoe     Mobility  Bed Mobility Overal bed mobility: Modified Independent                  Transfers Overall transfer level: Needs assistance Equipment used: Rolling Linton Stolp (2 wheels) Transfers: Sit to/from Stand Sit to Stand: Min guard                Ambulation/Gait Ambulation/Gait assistance: Min guard, Min assist Gait Distance (Feet): 60 Feet Assistive device: Rolling Erminie Foulks (2 wheels) Gait Pattern/deviations: Step-to pattern, Decreased stride length Gait velocity: decreased     General Gait Details: min guard for safety. good adherence of heel WB on R. LOB x 1 after turning requiring minA to recover   Stairs             Wheelchair Mobility    Modified Rankin (Stroke Patients Only)       Balance Overall balance assessment: Needs assistance Sitting-balance support: No upper extremity supported, Feet supported Sitting balance-Leahy Scale: Normal     Standing balance support: Bilateral upper extremity supported, Reliant on assistive device for balance Standing balance-Leahy Scale: Poor                              Cognition Arousal/Alertness: Awake/alert Behavior During Therapy: WFL for tasks assessed/performed Overall Cognitive Status: Within Functional Limits for tasks assessed                                          Exercises      General Comments        Pertinent  Vitals/Pain Pain Assessment Pain Assessment: Faces Faces Pain Scale: No hurt Pain Intervention(s): Monitored during session    Home Living                          Prior Function            PT Goals (current goals can now be found in the care plan section) Acute Rehab PT Goals PT Goal Formulation: With patient Time For Goal Achievement: 09/11/22 Potential to Achieve Goals: Good Progress towards PT goals: Progressing toward goals     Frequency    Min 4X/week      PT Plan Current plan remains appropriate    Co-evaluation              AM-PAC PT "6 Clicks" Mobility   Outcome Measure  Help needed turning from your back to your side while in a flat bed without using bedrails?: None Help needed moving from lying on your back to sitting on the side of a flat bed without using bedrails?: None Help needed moving to and from a bed to a chair (including a wheelchair)?: A Little Help needed standing up from a chair using your arms (e.g., wheelchair or bedside chair)?: Total Help needed to walk in hospital room?: Total Help needed climbing 3-5 steps with a railing? : Total 6 Click Score: 14    End of Session Equipment Utilized During Treatment: Gait belt Activity Tolerance: Patient tolerated treatment well Patient left: in chair;with call bell/phone within reach;with chair alarm set Nurse Communication: Mobility status;Precautions;Weight bearing status PT Visit Diagnosis: Other abnormalities of gait and mobility (R26.89);Muscle weakness (generalized) (M62.81);History of falling (Z91.81);Pain Pain - Right/Left: Right Pain - part of body: Ankle and joints of foot     Time: 1610-9604 PT Time Calculation (min) (ACUTE ONLY): 21 min  Charges:  $Gait Training: 8-22 mins                     Maylon Peppers, PT, DPT Physical Therapist - [facility]  [facility]    Toia Micale A Jorge Amparo 08/29/2022, 12:49 PM

## 2022-08-30 DIAGNOSIS — E861 Hypovolemia: Secondary | ICD-10-CM | POA: Diagnosis not present

## 2022-08-30 DIAGNOSIS — L089 Local infection of the skin and subcutaneous tissue, unspecified: Secondary | ICD-10-CM | POA: Diagnosis not present

## 2022-08-30 LAB — CBC
HCT: 34 % — ABNORMAL LOW (ref 36.0–46.0)
Hemoglobin: 9.6 g/dL — ABNORMAL LOW (ref 12.0–15.0)
MCH: 22.9 pg — ABNORMAL LOW (ref 26.0–34.0)
MCHC: 28.2 g/dL — ABNORMAL LOW (ref 30.0–36.0)
MCV: 81 fL (ref 80.0–100.0)
Platelets: 264 10*3/uL (ref 150–400)
RBC: 4.2 MIL/uL (ref 3.87–5.11)
RDW: 22 % — ABNORMAL HIGH (ref 11.5–15.5)
WBC: 7.1 10*3/uL (ref 4.0–10.5)
nRBC: 0 % (ref 0.0–0.2)

## 2022-08-30 LAB — GLUCOSE, CAPILLARY
Glucose-Capillary: 117 mg/dL — ABNORMAL HIGH (ref 70–99)
Glucose-Capillary: 157 mg/dL — ABNORMAL HIGH (ref 70–99)
Glucose-Capillary: 122 mg/dL — ABNORMAL HIGH (ref 70–99)

## 2022-08-30 NOTE — Progress Notes (Signed)
Occupational Therapy Treatment Patient Details Name: Kathryn [name] MRN: [id number] DOB: Jul 19, 1933 Today's Date: 08/30/2022   History of present illness Pt is an 89 y.o. female presenting to hospital 08/22/22 with c/o weakness and hypotension.  Pt admitted with hypotension d/t hypovolemia, lactic acidosis, hyperkalemia, osteomyelitis R second DIP joint, and normocytic anemia.  6/1 s/p LE angio.  6/2 s/p R 2nd toe amputation. PMH includes CHF, COPD, htn, DM, neuromuscular disorder (slight numbness R toes-comes and goes), PVD, PSVT, skin CA, back surgery, L breast lumpectomy, CTR R, joint replacement.   OT comments  Kathryn [name] was seen for OT treatment on this date. Upon arrival to room pt seated in chair, agreeable to tx. Pt requires CGA + RW for ADL t/f and standing grooming tasks, pt fatigues quickly. MIN A don B shoes in sitting. Pt making good progress toward goals, will continue to follow POC. Discharge recommendation remains appropriate.     Recommendations for follow up therapy are one component of a multi-disciplinary discharge planning process, led by the attending physician.  Recommendations may be updated based on patient status, additional functional criteria and insurance authorization.    Assistance Recommended at Discharge Frequent or constant Supervision/Assistance  Patient can return home with the following  A lot of help with walking and/or transfers;A lot of help with bathing/dressing/bathroom;Help with stairs or ramp for entrance   Equipment Recommendations  BSC/3in1;Other (comment)    Recommendations for Other Services      Precautions / Restrictions Precautions Precautions: Fall Restrictions Weight Bearing Restrictions: Yes RLE Weight Bearing: Weight bearing as tolerated Other Position/Activity Restrictions: R LE Heel WB'ing in surgical shoe       Mobility Bed Mobility               General bed mobility comments: received and left sitting     Transfers Overall transfer level: Needs assistance Equipment used: Rolling walker (2 wheels) Transfers: Sit to/from Stand Sit to Stand: Min guard                 Balance Overall balance assessment: Needs assistance Sitting-balance support: No upper extremity supported, Feet supported Sitting balance-Leahy Scale: Normal     Standing balance support: Single extremity supported, During functional activity Standing balance-Leahy Scale: Fair                             ADL either performed or assessed with clinical judgement   ADL Overall ADL's : Needs assistance/impaired                                       General ADL Comments: CGA + RW for ADL t/f and standing grooming tasks, pt fatigues quickly. MIN A don B shoes in sitting      Cognition Arousal/Alertness: Awake/alert Behavior During Therapy: WFL for tasks assessed/performed Overall Cognitive Status: Within Functional Limits for tasks assessed                                                     Pertinent Vitals/ Pain       Pain Assessment Pain Assessment: No/denies pain   Frequency  Min 2X/week        Progress Toward Goals  OT Goals(current goals can now be found in the care plan section)  Progress towards OT goals: Progressing toward goals  Acute Rehab OT Goals Patient Stated Goal: to get stronger OT Goal Formulation: With patient Time For Goal Achievement: 09/11/22 Potential to Achieve Goals: Good ADL Goals Pt Will Perform Grooming: with modified independence;sitting Pt Will Perform Lower Body Dressing: with modified independence;sit to/from stand Pt Will Transfer to Toilet: with modified independence;stand pivot transfer;bedside commode  Plan Discharge plan remains appropriate;Frequency remains appropriate    Co-evaluation                 AM-PAC OT "6 Clicks" Daily Activity     Outcome Measure   Help from another person eating  meals?: None Help from another person taking care of personal grooming?: A Little Help from another person toileting, which includes using toliet, bedpan, or urinal?: A Lot Help from another person bathing (including washing, rinsing, drying)?: A Lot Help from another person to put on and taking off regular upper body clothing?: None Help from another person to put on and taking off regular lower body clothing?: A Lot 6 Click Score: 17    End of Session Equipment Utilized During Treatment: Rolling walker (2 wheels)  OT Visit Diagnosis: Other abnormalities of gait and mobility (R26.89);Muscle weakness (generalized) (M62.81)   Activity Tolerance Patient tolerated treatment well   Patient Left in chair;with call bell/phone within reach;with family/visitor present   Nurse Communication          Time: 1191-4782 OT Time Calculation (min): 14 min  Charges: OT General Charges $OT Visit: 1 Visit OT Treatments $Self Care/Home Management : 8-22 mins  Kathie Dike, M.S. OTR/L  08/30/22, 11:32 AM  ascom [phone]

## 2022-08-30 NOTE — Progress Notes (Signed)
Physical Therapy Treatment Patient Details Name: Kathryn [name] MRN: [id number] DOB: 10/11/1933 Today's Date: 08/30/2022   History of Present Illness Pt is an 89 y.o. female presenting to hospital 08/22/22 with c/o weakness and hypotension.  Pt admitted with hypotension d/t hypovolemia, lactic acidosis, hyperkalemia, osteomyelitis R second DIP joint, and normocytic anemia.  6/1 s/p LE angio.  6/2 s/p R 2nd toe amputation. PMH includes CHF, COPD, htn, DM, neuromuscular disorder (slight numbness R toes-comes and goes), PVD, PSVT, skin CA, back surgery, L breast lumpectomy, CTR R, joint replacement.    PT Comments    Patient progressing towards physical therapy goals. Continues to adhere to heel WB on R in the surgical shoe while ambulating. Ambulated to bathroom and in hallway with min guard and RW. Requires minA to stand from low commode in bathroom. Continue to encourage mobility to/from bathroom as well as ambulation in hallway with nursing staff or mobility specialists. Discharge plan remains appropriate.     Recommendations for follow up therapy are one component of a multi-disciplinary discharge planning process, led by the attending physician.  Recommendations may be updated based on patient status, additional functional criteria and insurance authorization.  Follow Up Recommendations  Can patient physically be transported by private vehicle: No    Assistance Recommended at Discharge Frequent or constant Supervision/Assistance  Patient can return home with the following Assist for transportation;Help with stairs or ramp for entrance;A little help with bathing/dressing/bathroom;A little help with walking and/or transfers;Assistance with cooking/housework   Equipment Recommendations  Rolling Alexus Michael (2 wheels);BSC/3in1    Recommendations for Other Services       Precautions / Restrictions Precautions Precautions: Fall Restrictions Weight Bearing Restrictions: Yes RLE Weight Bearing:  Weight bearing as tolerated Other Position/Activity Restrictions: R LE Heel WB'ing in surgical shoe     Mobility  Bed Mobility Overal bed mobility: Modified Independent                  Transfers Overall transfer level: Needs assistance Equipment used: Rolling Carah Barrientes (2 wheels) Transfers: Sit to/from Stand Sit to Stand: Supervision, Min assist           General transfer comment: assist to stand from low surface    Ambulation/Gait Ambulation/Gait assistance: Min guard Gait Distance (Feet): 110 Feet Assistive device: Rolling Shani Fitch (2 wheels) Gait Pattern/deviations: Step-to pattern, Decreased stride length Gait velocity: decreased     General Gait Details: min guard for safety. Good adherence to heel WB on R with no LOB this session   Stairs             Wheelchair Mobility    Modified Rankin (Stroke Patients Only)       Balance Overall balance assessment: Needs assistance Sitting-balance support: No upper extremity supported, Feet supported Sitting balance-Leahy Scale: Normal     Standing balance support: Bilateral upper extremity supported, Reliant on assistive device for balance Standing balance-Leahy Scale: Fair                              Cognition Arousal/Alertness: Awake/alert Behavior During Therapy: WFL for tasks assessed/performed Overall Cognitive Status: Within Functional Limits for tasks assessed                                          Exercises      General Comments  Pertinent Vitals/Pain Pain Assessment Pain Assessment: No/denies pain    Home Living                          Prior Function            PT Goals (current goals can now be found in the care plan section) Acute Rehab PT Goals PT Goal Formulation: With patient Time For Goal Achievement: 09/11/22 Potential to Achieve Goals: Good Progress towards PT goals: Progressing toward goals    Frequency    Min  4X/week      PT Plan Current plan remains appropriate    Co-evaluation              AM-PAC PT "6 Clicks" Mobility   Outcome Measure  Help needed turning from your back to your side while in a flat bed without using bedrails?: None Help needed moving from lying on your back to sitting on the side of a flat bed without using bedrails?: None Help needed moving to and from a bed to a chair (including a wheelchair)?: A Little Help needed standing up from a chair using your arms (e.g., wheelchair or bedside chair)?: A Little Help needed to walk in hospital room?: A Little Help needed climbing 3-5 steps with a railing? : Total 6 Click Score: 18    End of Session Equipment Utilized During Treatment: Gait belt Activity Tolerance: Patient tolerated treatment well Patient left: in chair;with call bell/phone within reach;with chair alarm set Nurse Communication: Mobility status;Precautions;Weight bearing status PT Visit Diagnosis: Other abnormalities of gait and mobility (R26.89);Muscle weakness (generalized) (M62.81);History of falling (Z91.81);Pain Pain - Right/Left: Right Pain - part of body: Ankle and joints of foot     Time: 1610-9604 PT Time Calculation (min) (ACUTE ONLY): 20 min  Charges:  $Gait Training: 8-22 mins                     Maylon Peppers, PT, DPT Physical Therapist - [facility]  [facility]    Dale Strausser A Lorisa Scheid 08/30/2022, 10:20 AM

## 2022-08-30 NOTE — Progress Notes (Signed)
Progress Note   Patient: Kathryn [name] ZOX:[id number] DOB: 10-28-1933 DOA: 08/22/2022     8 DOS: the patient was seen and examined on 08/30/2022   Brief hospital course: CHELBY SHARF is a 89 y.o. female with medical history significant of Type II DM, HTN, normocytic anemia, hypothyroidism, CAD, PAF, GERD, COPD, HLD, tobacco user, depression, arthritis, lung nodule, basilar artery insufficiency, lumbar canal stenosis, CHF, lumbar radiculopathy, and known osteomyelitis of the right second toe based on MRI from May of this year.  Patient has been following wound care. They presented to ED after outpatient evaluation for worsening fatigue, loss of appetite and sleeping most of the time. She was found to be hypotensive at that appointment.   In ED her blood pressure was 84/48, labs pertinent for lactic acid of 3.2>>4.9, BUN 66, creatinine 1.53, baseline seems to be around 1.2 , potassium 5.2,  CT of abdomen and pelvis was negative for any acute abnormality.  Lower extremity venous Doppler studies were negative for DVT, foot x-ray with findings of known osteomyelitis of right second toe.   Patient received IV fluid with improvement of blood pressure.  Started on vancomycin and Zosyn. Vascular surgery and podiatry both consulted.   5/30: hemodynamically stable   5/31: lower extremity angiography and possible intervention with vascular surgery today. Hemoglobin and creatinine improving.   6/1: Hemodynamically stable.  Lower extremity angiography yesterday with 90% stenosis of bilateral common iliac arteries s/p PCI, 60 to 70% stenosis in the right SFA, left renal artery with limited flow.  Going for second toe amputation tomorrow.   6/2: Remains stable.  Significant improvement in bilateral lower extremity pain and now able to ambulate better after vascular procedure.  Going for second toe amputation today..   6/3: Hemodynamically stable.  S/p second right toe amputation. Hemoglobin at 7.4 this  morning.  After discussing with patient decided to proceed with 1 unit of PRBC transfusion.  Patient will be weightbearing on right heel and need to have a close follow-up with podiatry for further recommendations.  Pending PT/OT evaluation.   6/4-6/5: remains stable post-op. Incision healing well. Pain and swelling in legs greatly improved. Has a bed at SNF and is awaiting insurance auth. Expected dc 6/8.   6/6: Patient is medically cleared awaiting insurance authorization, expected dc tomorrow  Assessment and Plan: Hypotension due to hypovolemia- Resolved.    Toe osteomyelitis, right (HCC)- s/p amputation 6/2 Right second DIP joint . Was given broad-spectrum antibiotics with Zosyn and vancomycin. S/p angioplasty PCI 5/31 -Broad-spectrum antibiotics were discontinued -PT is recommending SNF- awaiting insurance auth.   Hyperkalemia- resolved -Continue to monitor  Essential hypertension  CAD  HFrEF  HLD  PAF- Prior echocardiogram with EF of 35 to 40%. Rate controlled and asymptomatic. Not on anticoagulation chronically. - continue home amiodarone, carvedilol, losartan and torsemide, lipitor  Normocytic anemia- s/p 1 unit of PRBC, baseline appears to be around 8.7. currently hgb 8.9 and no acute bleeding. Transfusion threshold <8 - monitor  Diabetes mellitus type 2, uncomplicated (HCC) Patient was on metformin and Farxiga at home. -SSI  Hypothyroidism -Continue home Synthroid  FTT (failure to thrive) in adult Improving.  Palliative care was also consulted.  Obesity with body mass index of 30.0-39.9 Estimated body mass index is 32.2 kg/m as calculated from the following:   Height as of this encounter: 5\' 4"  (1.626 m).   Weight as of this encounter: 85.1 kg.   GERD without esophagitis -Continue home Protonix  Subjective: Patient reports  no complaints. No new complaints. Awaiting insurance authorization.  Physical Exam: Vitals:   08/29/22 2021 08/30/22 0444 08/30/22 0452  08/30/22 0740  BP: 138/61 (!) 129/56  132/62  Pulse:  69  65  Resp: 18 20  20   Temp: 98.8 F (37.1 C) 97.7 F (36.5 C)  98.1 F (36.7 C)  TempSrc: Oral   Oral  SpO2: 95% 95%  96%  Weight:   83.4 kg   Height:       General. alert, in no acute distress. Pulmonary.  Lungs clear bilaterally, normal respiratory effort. CV.  Regular rate and rhythm, no JVD, rub or murmur. CNS.  Alert and oriented .  No focal neurologic deficit. Extremities.  no edema, no cyanosis, pulses intact and symmetrical. Right foot and lower leg with Ace wrap- removed bandage and wound approximated well without signs of infection. Good circulation and ROM in remaining foot.  Psychiatry.  Judgment and insight appears normal.     Data Reviewed: Prior data reviewed  Family Communication: son on phone with patient during encounter.   Disposition: Status is: Inpatient Remains inpatient appropriate because: awaiting SNF placement  Planned Discharge Destination: SNF  DVT prophylaxis.  Lovenox Time spent: 45 minutes  Author: Harold Hedge, MD 08/30/2022 2:35 PM  For on call review [email or web address].

## 2022-08-30 NOTE — Progress Notes (Signed)
  Subjective:  Patient ID: Kathryn [name], female    DOB: 1933-05-28,  MRN: [id number]  Chief Complaint  Patient presents with   Weakness   Hypotension    DOS: 08/26/22 Procedure: R 2nd toe amputation - Dr. Logan Bores  89 y.o. female eval for post-op check. Reports no pain. Doing well. No questions.  Review of Systems: Negative except as noted in the HPI. Denies N/V/F/Ch.   Objective:   Vitals:   08/30/22 0740 08/30/22 1621  BP: 132/62 124/60  Pulse: 65 71  Resp: 20 18  Temp: 98.1 F (36.7 C) 98 F (36.7 C)  SpO2: 96% 98%   Body mass index is 31.56 kg/m. Constitutional Well developed. Well nourished.  Vascular Foot warm and well perfused. Capillary refill normal to all digits.  Calf is soft and supple, no posterior calf or knee pain, negative Homans' sign  Neurologic Normal speech. Oriented to person, place, and time. Epicritic sensation to light touch grossly present bilaterally.  Dermatologic Skin healing well without signs of infection. Skin edges well coapted without signs of infection.   Orthopedic: Tenderness to palpation noted about the surgical site.   Multiple view plain film radiographs: Deferred Assessment:   1. Weakness   2. Dehydration   3. AKI (acute kidney injury) (HCC)   4. Foot infection    Plan:  Patient was evaluated and treated and all questions answered.  S/p foot surgery right 2nd toe amp -Progressing as expected post-operatively. -Dressing changed today. Betadine gauze dressing applied. Leave on until follow up 1 week in office next thurs/fri -WB Status: WBAT in post op shoe -Sutures: to remain intact until next visist. -Medications: per primary -Foot redressed. - All questions answered. Will sign off.          Corinna Gab, DPM [facility] / [facility]

## 2022-08-30 NOTE — TOC Progression Note (Signed)
Transition of Care [facility]) - Progression Note    Patient Details  Name: Kathryn [name] MRN: [id number] Date of Birth: 18-May-1933  Transition of Care [facility]) CM/SW Contact  Margarito Liner, LCSW Phone Number: 08/30/2022, 12:36 PM  Clinical Narrative:  Insurance authorization still pending.   Expected Discharge Plan: Skilled Nursing Facility Barriers to Discharge: English as a second language teacher, SNF Pending bed offer  Expected Discharge Plan and Services     Post Acute Care Choice: Skilled Nursing Facility Living arrangements for the past 2 months: Single Family Home                                       Social Determinants of Health (SDOH) Interventions SDOH Screenings   Food Insecurity: No Food Insecurity (08/23/2022)  Housing: Low Risk  (08/23/2022)  Transportation Needs: No Transportation Needs (08/23/2022)  Utilities: Not At Risk (08/23/2022)  Alcohol Screen: Low Risk  (08/13/2022)  Depression (PHQ2-9): High Risk (08/22/2022)  Financial Resource Strain: Low Risk  (08/13/2022)  Physical Activity: Inactive (08/13/2022)  Social Connections: Moderately Isolated (08/13/2022)  Stress: No Stress Concern Present (08/13/2022)  Tobacco Use: Medium Risk (08/27/2022)    Readmission Risk Interventions    08/28/2022   11:14 AM 05/27/2020   12:12 PM  Readmission Risk Prevention Plan  Transportation Screening  Complete  PCP or Specialist Appt within 5-7 Days  Complete  PCP or Specialist Appt within 3-5 Days Complete   Home Care Screening  Complete  Medication Review (RN CM)  Complete  Social Work Consult for Recovery Care Planning/Counseling Complete   Palliative Care Screening Not Applicable

## 2022-08-31 ENCOUNTER — Ambulatory Visit: Payer: Medicare HMO | Admitting: Podiatry

## 2022-08-31 DIAGNOSIS — E861 Hypovolemia: Secondary | ICD-10-CM | POA: Diagnosis not present

## 2022-08-31 LAB — SURGICAL PATHOLOGY

## 2022-08-31 LAB — GLUCOSE, CAPILLARY: Glucose-Capillary: 144 mg/dL — ABNORMAL HIGH (ref 70–99)

## 2022-08-31 MED ORDER — HYDROCODONE-ACETAMINOPHEN 5-325 MG PO TABS: 1.0000 | ORAL_TABLET | ORAL | 0 refills | Status: DC | PRN

## 2022-08-31 MED ORDER — CLOPIDOGREL BISULFATE 75 MG PO TABS: 75.0000 mg | ORAL_TABLET | Freq: Every day | ORAL | 1 refills | Status: AC

## 2022-08-31 NOTE — TOC Transition Note (Signed)
Transition of Care [facility]) - CM/SW Discharge Note   Patient Details  Name: Kathryn [name] MRN: [id number] Date of Birth: 1933/08/05  Transition of Care [facility]) CM/SW Contact:  Margarito Liner, LCSW Phone Number: 08/31/2022, 11:20 AM   Clinical Narrative:   Patient has orders to discharge to [facility] today. RN will call report to [phone] (Room 210). EMS transport has been arranged and she is 3rd on the list. No further concerns. CSW signing off.  Final next level of care: Skilled Nursing Facility Barriers to Discharge: Barriers Resolved   Patient Goals and CMS Choice CMS Medicare.gov Compare Post Acute Care list provided to:: Patient    Discharge Placement     Existing PASRR number confirmed : 08/28/22          Patient chooses bed at: [facility] Patient to be transferred to facility by: EMS   Patient and family notified of of transfer: 08/31/22  Discharge Plan and Services Additional resources added to the After Visit Summary for       Post Acute Care Choice: Skilled Nursing Facility                               Social Determinants of Health (SDOH) Interventions SDOH Screenings   Food Insecurity: No Food Insecurity (08/23/2022)  Housing: Low Risk  (08/23/2022)  Transportation Needs: No Transportation Needs (08/23/2022)  Utilities: Not At Risk (08/23/2022)  Alcohol Screen: Low Risk  (08/13/2022)  Depression (PHQ2-9): High Risk (08/22/2022)  Financial Resource Strain: Low Risk  (08/13/2022)  Physical Activity: Inactive (08/13/2022)  Social Connections: Moderately Isolated (08/13/2022)  Stress: No Stress Concern Present (08/13/2022)  Tobacco Use: Medium Risk (08/27/2022)     Readmission Risk Interventions    08/28/2022   11:14 AM 05/27/2020   12:12 PM  Readmission Risk Prevention Plan  Transportation Screening  Complete  PCP or Specialist Appt within 5-7 Days  Complete  PCP or Specialist Appt within 3-5 Days Complete   Home Care  Screening  Complete  Medication Review (RN CM)  Complete  Social Work Consult for Recovery Care Planning/Counseling Complete   Palliative Care Screening Not Applicable

## 2022-08-31 NOTE — TOC Progression Note (Signed)
Transition of Care [facility]) - Progression Note    Patient Details  Name: Kathryn [name] MRN: [id number] Date of Birth: 02/17/1934  Transition of Care [facility]) CM/SW Contact  Margarito Liner, LCSW Phone Number: 08/31/2022, 8:21 AM  Clinical Narrative:  Insurance authorization approved: [id number]. Valid 6/7-6/19. Left message for [facility] admissions coordinator to notify and confirm they can still accept her today.   Expected Discharge Plan: Skilled Nursing Facility Barriers to Discharge: English as a second language teacher, SNF Pending bed offer  Expected Discharge Plan and Services     Post Acute Care Choice: Skilled Nursing Facility Living arrangements for the past 2 months: Single Family Home                                       Social Determinants of Health (SDOH) Interventions SDOH Screenings   Food Insecurity: No Food Insecurity (08/23/2022)  Housing: Low Risk  (08/23/2022)  Transportation Needs: No Transportation Needs (08/23/2022)  Utilities: Not At Risk (08/23/2022)  Alcohol Screen: Low Risk  (08/13/2022)  Depression (PHQ2-9): High Risk (08/22/2022)  Financial Resource Strain: Low Risk  (08/13/2022)  Physical Activity: Inactive (08/13/2022)  Social Connections: Moderately Isolated (08/13/2022)  Stress: No Stress Concern Present (08/13/2022)  Tobacco Use: Medium Risk (08/27/2022)    Readmission Risk Interventions    08/28/2022   11:14 AM 05/27/2020   12:12 PM  Readmission Risk Prevention Plan  Transportation Screening  Complete  PCP or Specialist Appt within 5-7 Days  Complete  PCP or Specialist Appt within 3-5 Days Complete   Home Care Screening  Complete  Medication Review (RN CM)  Complete  Social Work Consult for Recovery Care Planning/Counseling Complete   Palliative Care Screening Not Applicable

## 2022-08-31 NOTE — Plan of Care (Signed)
  Problem: Clinical Measurements: Goal: Ability to maintain clinical measurements within normal limits will improve Outcome: Progressing Goal: Cardiovascular complication will be avoided Outcome: Progressing   Problem: Activity: Goal: Risk for activity intolerance will decrease Outcome: Progressing   Problem: Nutrition: Goal: Adequate nutrition will be maintained Outcome: Progressing   Problem: Coping: Goal: Level of anxiety will decrease Outcome: Progressing   Problem: Elimination: Goal: Will not experience complications related to bowel motility Outcome: Progressing Goal: Will not experience complications related to urinary retention Outcome: Progressing   Problem: Pain Managment: Goal: General experience of comfort will improve Outcome: Progressing

## 2022-08-31 NOTE — Care Management Important Message (Signed)
Important Message  Patient Details  Name: Kathryn [name] MRN: [id number] Date of Birth: 1933/05/14   Medicare Important Message Given:  Yes     Johnell Comings 08/31/2022, 11:29 AM

## 2022-08-31 NOTE — Discharge Summary (Addendum)
[name]:[id number] DOB: 07/19/1933 DOA: 08/22/2022  PCP: Ronnald Ramp, MD  Admit date: 08/22/2022 Discharge date: 08/31/2022  Time spent: 35 minutes  Recommendations for Outpatient Follow-up:  Podiatry f/u 6/13 or 6/14. Keep current dressing on foot until that f/u appointment. WBAT right leg with use of post-op shoe. Pcp and cardiology and vascular surgery f/u    Discharge Diagnoses:  Principal Problem:   Hypotension due to hypovolemia Active Problems:   Lactic acidosis   Toe osteomyelitis, right (HCC)   Essential hypertension   Hyperkalemia, diminished renal excretion   Normocytic anemia   Diabetes mellitus type 2, uncomplicated (HCC)   Elevated erythrocyte sedimentation rate   Hypothyroidism   CAD (coronary artery disease)   Paroxysmal atrial fibrillation (HCC)   HFrEF (heart failure with reduced ejection fraction) (HCC)   FTT (failure to thrive) in adult   Obesity with body mass index of 30.0-39.9   GERD without esophagitis   Weakness   Dehydration   Foot infection   Osteomyelitis of toe of right foot (HCC)   Discharge Condition: stable  Diet recommendation: heart healthy  Filed Weights   08/24/22 0500 08/30/22 0452 08/31/22 0357  Weight: 83.9 kg 83.4 kg 84.6 kg    History of present illness:  From admission h and p [name] is a 89 y.o. female with medical history significant of known osteomyelitis of the right second toe based on MRI that is in the system from early May of this year.  Patient has been following wound care of wound for evaluation.  It does not seem that patient has seen podiatry.  Patient has seen vascular surgery in the past.  Patient reports chronic syndrome lasting several months of on and off fatigue, poor appetite.  And she was at her baseline being able to ambulate with a walker and eat and drink per usual till about 2 weeks ago.  Since then she reports a rather severe loss of appetite associated with fatigue and  wanting to sleep most of the time.  She does not report presyncope or positional changes does not report chest pain or shortness of breath does not report leg swelling rash on skin does not report rigors or fevers does not report vomiting or diarrhea or burning with micturition.   Patient was so fatigued that daughter insisted that patient see provider today.  Patient was therefore evaluated at an outpatient clinic where patient was found to be hypotensive.  Patient sent to the ER at [facility].  Per chart review patient's blood pressure was 84/48 at outpatient facility. ER course is notable for received of IV fluids and IV antibiotics, finding of lactic acidosis, worsening creatinine and BUN.  Patient has been given IV fluids and last blood pressure is 127/34.  Hospital Course:  Patient presents with fatigue and hypotension. Started on broad spectrum antibiotics for known osteomyelitis of the right second tow. On 6/1 angiography performed with 90% stenosis of bilateral common iliac arteries, PCI performed with stent to bilateral common iliac arteries Plavix was added. On 6/2 right second toe amputation performed by podiatry. Intermittent asymptomatic hypotension continued, home losartan will be held at discharge, advise pcp and cardiology f/u post-discharge. Podiatry evaluated day prior to discharge, healing appropriate, dressing changed, advise f/u with them in 1 week, maintain current dressing until then, wbat that extremity in post-op shoe.   Procedures: See above   Consultations: Vascular surgery, podiatry  Discharge Exam: Vitals:   08/31/22 0359 08/31/22 0855  BP: (!) 124/58 Marland Kitchen)  103/52  Pulse: 65 76  Resp: 18 18  Temp: 97.7 F (36.5 C) 98.1 F (36.7 C)  SpO2: 94% 96%    General: NAD Cardiovascular: RRR Respiratory: CTAB EXt: right foot bandaged  Discharge Instructions   Discharge Instructions     Diet - low sodium heart healthy   Complete by: As directed    Discharge wound care:    Complete by: As directed    Keep foot dressing in place until podiatry f/u in one week   Increase activity slowly   Complete by: As directed       Allergies as of 08/31/2022       Reactions   Oxycodone Other (See Comments)   Mental status change   Prednisone    swelling, bruising.        Medication List     STOP taking these medications    doxycycline 100 MG capsule Commonly known as: VIBRAMYCIN   losartan 25 MG tablet Commonly known as: COZAAR   omeprazole 20 MG capsule Commonly known as: PRILOSEC       TAKE these medications    acetaminophen 500 MG tablet Commonly known as: TYLENOL Take 1,000 mg by mouth every 6 (six) hours as needed. Pain   albuterol (2.5 MG/3ML) 0.083% nebulizer solution Commonly known as: PROVENTIL USE 1 VIAL VIA NEBULIZER EVERY 6 HOURS AS NEEDED FOR WHEEZING OR SHORTNESS OF BREATH   amiodarone 200 MG tablet Commonly known as: PACERONE Take 0.5 tablets (100 mg total) by mouth daily.   aspirin EC 81 MG tablet Take 81 mg by mouth daily. Swallow whole.   atorvastatin 40 MG tablet Commonly known as: LIPITOR TAKE 1 TABLET(40 MG) BY MOUTH DAILY   carvedilol 6.25 MG tablet Commonly known as: COREG TAKE 1 TABLET(6.25 MG) BY MOUTH TWICE DAILY WITH A MEAL   clopidogrel 75 MG tablet Commonly known as: PLAVIX Take 1 tablet (75 mg total) by mouth daily. Start taking on: September 01, 2022   Farxiga 10 MG Tabs tablet Generic drug: dapagliflozin propanediol TAKE 1 TABLET(10 MG) BY MOUTH DAILY   gabapentin 400 MG capsule Commonly known as: NEURONTIN Take 1 capsule (400 mg total) by mouth 4 (four) times daily.   glucose blood test strip Commonly known as: Contour Next Test Check sugar once daily  DX E11.9   HYDROcodone-acetaminophen 5-325 MG tablet Commonly known as: NORCO/VICODIN Take 1 tablet by mouth every 4 (four) hours as needed for moderate pain or severe pain.   hydrOXYzine 10 MG tablet Commonly known as: ATARAX TAKE 1 TO 2  TABLETS(10 TO 20 MG) BY MOUTH EVERY 6 HOURS AS NEEDED   levothyroxine 100 MCG tablet Commonly known as: SYNTHROID TAKE 1 TABLET(100 MCG) BY MOUTH DAILY BEFORE BREAKFAST   metFORMIN 1000 MG tablet Commonly known as: GLUCOPHAGE TAKE 1 TABLET(1000 MG) BY MOUTH DAILY WITH SUPPER   Spiriva HandiHaler 18 MCG inhalation capsule Generic drug: tiotropium PLACE 1 CAPSULE INTO INHALER AND INHALE EVERY DAY AS NEEDED   torsemide 20 MG tablet Commonly known as: DEMADEX Take 40 mg by mouth daily. May take an extra 20 mg after lunch as needed for swelling.               Discharge Care Instructions  (From admission, onward)           Start     Ordered   08/31/22 0000  Discharge wound care:       Comments: Keep foot dressing in place until podiatry f/u in one  week   08/31/22 1027           Allergies  Allergen Reactions   Oxycodone Other (See Comments)    Mental status change   Prednisone     swelling, bruising.    Contact information for follow-up providers     Felecia Shelling, DPM. Go on 09/04/2022.   Specialty: Podiatry Why: call to schedule follow-up. Go at 11:30 am. Contact information: [address] [place] [address] [phone]         Ronnald Ramp, MD Follow up on 09/10/2022.   Specialty: Family Medicine Why: Arrival time 1:05pm appointment scheduled at 1:20pm. Contact information: [address] [place] [address] [phone]         Antonieta Iba, MD. Go on 09/25/2022.   Specialty: Cardiology Why: Scheduled for July 2nd at 10:30am. Wait listed for sooner appointment. Contact information: [address] [place] [address] [phone]         Schnier, Latina Craver, MD. Go on 09/13/2022.   Specialties: Vascular Surgery, Cardiology, Radiology, Vascular Surgery Why: Go at 3:45 pm. Contact information: [address] [place] [address] [phone]               Contact information for after-discharge care     Destination     [facility] [place] [facility] .   Service: Skilled Nursing Contact information: [address] [place] [address] [phone]                      The results of significant diagnostics from this hospitalization (including imaging, microbiology, ancillary and laboratory) are listed below for reference.    Significant Diagnostic Studies: DG Foot Complete Right  Result Date: 08/26/2022 CLINICAL DATA:  Postop. EXAM: RIGHT FOOT COMPLETE - 3+ VIEW COMPARISON:  Preoperative exam FINDINGS: Interval resection of the second toe. Expected postsurgical change in the soft tissues with overlying dressing in place. The exam is otherwise unchanged. IMPRESSION: Interval resection of the second toe. No immediate postoperative complication. Electronically Signed   By: Narda Rutherford M.D.   On: 08/26/2022 15:30   PERIPHERAL VASCULAR CATHETERIZATION  Result Date: 08/24/2022 See surgical note for result.  ECHOCARDIOGRAM COMPLETE  Result Date: 08/23/2022    ECHOCARDIOGRAM REPORT   Patient Name:   SALIMATOU KAMBER Date of Exam: 08/23/2022 Medical Rec #:  [id number]       Height:       64.0 in Accession #:    [id number]      Weight:       187.6 lb Date of Birth:  05/13/1933       BSA:          1.904 m Patient Age:    88 years        BP:           115/65 mmHg Patient Gender: F               HR:           89 bpm. Exam Location:  [facility] Procedure: 2D Echo, Cardiac Doppler and Color Doppler Indications:     Cardiomyopathy-unspecified I42.9  History:         Patient has prior history of Echocardiogram examinations, most                  recent 05/24/2022. CHF and Cardiomyopathy, COPD; Risk  Factors:Diabetes.  Sonographer:     Cristela Blue Referring Phys:  [id number] [facility] GOEL Diagnosing Phys: Cristal Deer End MD  Sonographer Comments: Technically challenging study due to limited acoustic  windows and no parasternal window. IMPRESSIONS  1. Left ventricular ejection fraction, by estimation, is 40 to 45%. The left ventricle has mildly decreased function. Left ventricular endocardial border not optimally defined to evaluate regional wall motion. Left ventricular diastolic parameters are consistent with Grade I diastolic dysfunction (impaired relaxation).  2. Right ventricular systolic function is normal. The right ventricular size is normal. There is normal pulmonary artery systolic pressure.  3. The mitral valve was not well visualized. Mild mitral valve regurgitation. No evidence of mitral stenosis.  4. The aortic valve was not well visualized. Aortic valve regurgitation is not visualized. No aortic stenosis is present.  5. The inferior vena cava is normal in size with <50% respiratory variability, suggesting right atrial pressure of 8 mmHg. FINDINGS  Left Ventricle: Left ventricular size and well thickness not well assessed due to suboptimal windows. Left ventricular ejection fraction, by estimation, is 40 to 45%. The left ventricle has mildly decreased function. Left ventricular endocardial border not optimally defined to evaluate regional wall motion. Left ventricular diastolic parameters are consistent with Grade I diastolic dysfunction (impaired relaxation). Right Ventricle: The right ventricular size is normal. No increase in right ventricular wall thickness. Right ventricular systolic function is normal. There is normal pulmonary artery systolic pressure. The tricuspid regurgitant velocity is 1.97 m/s, and  with an assumed right atrial pressure of 8 mmHg, the estimated right ventricular systolic pressure is 23.5 mmHg. Left Atrium: Left atrial size was normal in size. Right Atrium: Right atrial size was normal in size. Pericardium: There is no evidence of pericardial effusion. Mitral Valve: The mitral valve was not well visualized. Mild mitral annular calcification. Mild mitral valve  regurgitation. No evidence of mitral valve stenosis. MV peak gradient, 8.1 mmHg. The mean mitral valve gradient is 4.0 mmHg. Tricuspid Valve: The tricuspid valve is not well visualized. Tricuspid valve regurgitation is not demonstrated. Aortic Valve: The aortic valve was not well visualized. Aortic valve regurgitation is not visualized. No aortic stenosis is present. Aortic valve mean gradient measures 3.0 mmHg. Aortic valve peak gradient measures 6.2 mmHg. Aortic valve area, by VTI measures 2.87 cm. Pulmonic Valve: The pulmonic valve was not well visualized. Pulmonic valve regurgitation is not visualized. No evidence of pulmonic stenosis. Aorta: The aortic root was not well visualized. Pulmonary Artery: The pulmonary artery is not well seen. Venous: The inferior vena cava is normal in size with less than 50% respiratory variability, suggesting right atrial pressure of 8 mmHg. IAS/Shunts: No atrial level shunt detected by color flow Doppler.  LEFT VENTRICLE PLAX 2D LVIDd:         3.40 cm      Diastology LVIDs:         2.80 cm      LV e' medial:    6.42 cm/s LV PW:         1.30 cm      LV E/e' medial:  14.0 LV IVS:        1.20 cm      LV e' lateral:   7.83 cm/s LVOT diam:     2.00 cm      LV E/e' lateral: 11.5 LV SV:         66 LV SV Index:   34 LVOT Area:     3.14 cm  LV Volumes (  MOD) LV vol d, MOD A2C: 98.6 ml LV vol d, MOD A4C: 122.0 ml LV vol s, MOD A2C: 51.3 ml LV vol s, MOD A4C: 79.4 ml LV SV MOD A2C:     47.3 ml LV SV MOD A4C:     122.0 ml LV SV MOD BP:      48.8 ml RIGHT VENTRICLE RV Basal diam:  3.60 cm RV Mid diam:    3.10 cm LEFT ATRIUM             Index        RIGHT ATRIUM           Index LA diam:        2.70 cm 1.42 cm/m   RA Area:     14.90 cm LA Vol (A2C):   38.2 ml 20.06 ml/m  RA Volume:   40.20 ml  21.11 ml/m LA Vol (A4C):   27.2 ml 14.29 ml/m LA Biplane Vol: 33.9 ml 17.81 ml/m  AORTIC VALVE AV Area (Vmax):    2.66 cm AV Area (Vmean):   2.43 cm AV Area (VTI):     2.87 cm AV Vmax:            124.00 cm/s AV Vmean:          83.250 cm/s AV VTI:            0.229 m AV Peak Grad:      6.2 mmHg AV Mean Grad:      3.0 mmHg LVOT Vmax:         105.00 cm/s LVOT Vmean:        64.400 cm/s LVOT VTI:          0.209 m LVOT/AV VTI ratio: 0.91 MITRAL VALVE                TRICUSPID VALVE MV Area (PHT): 3.34 cm     TR Peak grad:   15.5 mmHg MV Area VTI:   2.37 cm     TR Vmax:        197.00 cm/s MV Peak grad:  8.1 mmHg MV Mean grad:  4.0 mmHg     SHUNTS MV Vmax:       1.42 m/s     Systemic VTI:  0.21 m MV Vmean:      89.1 cm/s    Systemic Diam: 2.00 cm MV Decel Time: 227 msec MV E velocity: 90.10 cm/s MV A velocity: 123.00 cm/s MV E/A ratio:  0.73 Cristal Deer End MD Electronically signed by Yvonne Kendall MD Signature Date/Time: 08/23/2022/5:10:24 PM    Final    CT ABDOMEN PELVIS WO CONTRAST  Result Date: 08/23/2022 CLINICAL DATA:  Abdominal pain. EXAM: CT ABDOMEN AND PELVIS WITHOUT CONTRAST TECHNIQUE: Multidetector CT imaging of the abdomen and pelvis was performed following the standard protocol without IV contrast. RADIATION DOSE REDUCTION: This exam was performed according to the departmental dose-optimization program which includes automated exposure control, adjustment of the mA and/or kV according to patient size and/or use of iterative reconstruction technique. COMPARISON:  No prior abdomen and pelvis CT. Limited comparison CTA chest 05/24/2020. FINDINGS: Lower chest: There are bilateral posterior basilar opacities merging with coarse linear atelectatic or scar-like markings. The opacities are in areas where there was denser consolidation previously and could be due to post pneumonic scarring. Component of recurrent pneumonia is not excluded. The more anterior lung bases are clear. The cardiac size is normal. There are calcifications in the aortic valve leaflets and descending aorta.  Small hiatal hernia. No pericardial fluid. Hepatobiliary: There are a few calcified hepatic granulomas. No focal abnormality is  seen without contrast. Gallbladder and bile ducts are unremarkable. Pancreas: Unremarkable without contrast. Spleen: Unremarkable without contrast. Adrenals/Urinary Tract: There is no adrenal mass no focal abnormality of the unenhanced renal cortex. There is no urinary stone or obstruction. Only the anterior third of the bladder is well visualized and unremarkable. The posterior 2/3 of the bladder is obscured by metal artifact from bilateral hip replacements. Stomach/Bowel: No dilatation or wall thickening including the appendix. Uncomplicated sigmoid diverticulosis. Vascular/Lymphatic: The aorta and iliac arteries are heavily calcified. There is likely a flow limiting stenosis in the distal abdominal aorta and almost certainly in the common iliac arteries and internal iliac arteries. The external iliac arteries are less heavily calcified. There is no AAA. No adenopathy. Reproductive: Uterus and bilateral adnexa are unremarkable. Multiple pelvic phleboliths. Other: There is no free air, free hemorrhage, free fluid or acute inflammatory change. There is a small umbilical fat hernia. There are no incarcerated hernias. Musculoskeletal: Osteopenia. Advanced degenerative change lumbar spine with mild levoscoliosis. Bilateral hip replacements. No acute or aggressive osseous lesion. Remote laminectomy and bone grafting changes are noted L3 and 4.1 IMPRESSION: 1. No acute noncontrast CT abnormality is seen in the abdomen or pelvis. 2. Aortic and iliac atherosclerosis with probable flow limiting stenosis in the distal abdominal aorta and almost certainly in the common and internal iliac arteries. 3. Diverticulosis without evidence of diverticulitis. 4. Small hiatal hernia. 5. Bilateral posterior basilar lung opacities which could be due to post pneumonic scarring. Component of recurrent pneumonia is not excluded. 6. Osteopenia, postsurgical and degenerative change. 7. Small umbilical fat hernia. Aortic Atherosclerosis  (ICD10-I70.0). Electronically Signed   By: Almira Bar M.D.   On: 08/23/2022 04:42   US Venous Img Lower Bilateral (DVT)  Result Date: 08/22/2022 CLINICAL DATA:  Bilateral leg swelling. EXAM: BILATERAL LOWER EXTREMITY VENOUS DOPPLER ULTRASOUND TECHNIQUE: Gray-scale sonography with graded compression, as well as color Doppler and duplex ultrasound were performed to evaluate the lower extremity deep venous systems from the level of the common femoral vein and including the common femoral, femoral, profunda femoral, popliteal and calf veins including the posterior tibial, peroneal and gastrocnemius veins when visible. The superficial great saphenous vein was also interrogated. Spectral Doppler was utilized to evaluate flow at rest and with distal augmentation maneuvers in the common femoral, femoral and popliteal veins. COMPARISON:  May 25, 2020 FINDINGS: RIGHT LOWER EXTREMITY Common Femoral Vein: No evidence of thrombus. Normal compressibility, respiratory phasicity and response to augmentation. Saphenofemoral Junction: No evidence of thrombus. Normal compressibility and flow on color Doppler imaging. Profunda Femoral Vein: No evidence of thrombus. Normal compressibility and flow on color Doppler imaging. Femoral Vein: No evidence of thrombus. Normal compressibility, respiratory phasicity and response to augmentation. Popliteal Vein: No evidence of thrombus. Normal compressibility, respiratory phasicity and response to augmentation. Calf Veins: No evidence of thrombus. Normal compressibility and flow on color Doppler imaging. Superficial Great Saphenous Vein: No evidence of thrombus. Normal compressibility. Venous Reflux:  None. Other Findings:  None. LEFT LOWER EXTREMITY Common Femoral Vein: No evidence of thrombus. Normal compressibility, respiratory phasicity and response to augmentation. Saphenofemoral Junction: No evidence of thrombus. Normal compressibility and flow on color Doppler imaging. Profunda  Femoral Vein: No evidence of thrombus. Normal compressibility and flow on color Doppler imaging. Femoral Vein: No evidence of thrombus. Normal compressibility, respiratory phasicity and response to augmentation. Popliteal Vein: No evidence of thrombus. Normal  compressibility, respiratory phasicity and response to augmentation. Calf Veins: No evidence of thrombus. Normal compressibility and flow on color Doppler imaging. Superficial Great Saphenous Vein: No evidence of thrombus. Normal compressibility. Venous Reflux:  None. Other Findings:  None. IMPRESSION: No evidence of deep venous thrombosis in either lower extremity. Electronically Signed   By: Aram Candela M.D.   On: 08/22/2022 22:53   DG Toe 2nd Right  Addendum Date: 08/22/2022   ADDENDUM REPORT: 08/22/2022 20:57 ADDENDUM: Given the provided history of a soft tissue ulceration involving the second right toe, the previously noted cortical defect involving the distal aspect of the proximal phalanx may represent sequelae associated with acute osteomyelitis. MRI correlation is recommended. Electronically Signed   By: Aram Candela M.D.   On: 08/22/2022 20:57   Result Date: 08/22/2022 CLINICAL DATA:  Second right toe pain and swelling. EXAM: RIGHT SECOND TOE COMPARISON:  May 14, 2022 FINDINGS: There is an acute fracture deformity extending through the distal aspect of the proximal phalanx of the second right toe. There is no evidence of dislocation. Moderate to marked severity diffuse soft tissue swelling is seen. IMPRESSION: Acute fracture of the proximal phalanx of the second right toe. Electronically Signed: By: Aram Candela M.D. On: 08/22/2022 19:37    Microbiology: Recent Results (from the past 240 hour(s))  Blood culture (single)     Status: None   Collection Time: 08/22/22  6:02 PM   Specimen: BLOOD  Result Value Ref Range Status   Specimen Description BLOOD RIGHT ANTECUBITAL  Final   Special Requests   Final    BOTTLES  DRAWN AEROBIC AND ANAEROBIC Blood Culture results may not be optimal due to an excessive volume of blood received in culture bottles   Culture   Final    NO GROWTH 5 DAYS Performed at [facility], [address], [place], [place] [address]    Report Status 08/27/2022 FINAL  Final  Culture, blood (single)     Status: None   Collection Time: 08/22/22  8:19 PM   Specimen: BLOOD  Result Value Ref Range Status   Specimen Description BLOOD HAND  Final   Special Requests   Final    Blood Culture results may not be optimal due to an excessive volume of blood received in culture bottles   Culture   Final    NO GROWTH 5 DAYS Performed at [facility], [address] [place] [place], [place] [address]    Report Status 08/27/2022 FINAL  Final     Labs: Basic Metabolic Panel: Recent Labs  Lab 08/25/22 1019 08/26/22 0503 08/27/22 0513 08/29/22 0454  NA 136 139 139 140  K 3.9 3.4* 3.6 3.7  CL 108 109 109 108  CO2 22 23 25 26   GLUCOSE 193* 104* 107* 100*  BUN 29* 30* 23 18  CREATININE 1.09* 1.05* 0.92 0.92  CALCIUM 8.3* 8.3* 8.1* 8.2*   Liver Function Tests: No results for input(s): "AST", "ALT", "ALKPHOS", "BILITOT", "PROT", "ALBUMIN" in the last 168 hours. No results for input(s): "LIPASE", "AMYLASE" in the last 168 hours. No results for input(s): "AMMONIA" in the last 168 hours. CBC: Recent Labs  Lab 08/25/22 1019 08/26/22 0503 08/27/22 0513 08/27/22 2049 08/29/22 0454 08/30/22 0550  WBC 8.4 6.8 6.3  --  6.0 7.1  HGB 7.4* 7.7* 7.4* 8.6* 8.9* 9.6*  HCT 26.9* 27.7* 26.7* 31.7* 31.0* 34.0*  MCV 78.9* 77.6* 78.8*  --  79.5* 81.0  PLT 182 179 201  --  209  264   Cardiac Enzymes: No results for input(s): "CKTOTAL", "CKMB", "CKMBINDEX", "TROPONINI" in the last 168 hours. BNP: BNP (last 3 results) Recent Labs    03/13/22 0448 03/21/22 2225 08/22/22 1830  BNP 478.7* 676.4* 123.7*    ProBNP (last 3 results) No results for input(s): "PROBNP" in the last 8760  hours.  CBG: Recent Labs  Lab 08/29/22 1650 08/30/22 0833 08/30/22 1046 08/30/22 1651 08/31/22 0903  GLUCAP 123* 117* 157* 122* 144*       Signed:  Silvano Bilis MD.  [facility] 08/31/2022, 11:14 AM

## 2022-08-31 NOTE — Progress Notes (Signed)
Report called to Cassandra, Charity fundraiser, at [facility].  Awaiting EMS transport.

## 2022-09-04 ENCOUNTER — Ambulatory Visit: Payer: Commercial Indemnity | Admitting: Family Medicine

## 2022-09-04 ENCOUNTER — Ambulatory Visit: Payer: Medicare HMO | Admitting: Podiatry

## 2022-09-04 DIAGNOSIS — M869 Osteomyelitis, unspecified: Secondary | ICD-10-CM | POA: Diagnosis not present

## 2022-09-04 NOTE — Progress Notes (Signed)
Chief Complaint  Patient presents with   Routine Post Op    Pov# 1 right foot Amputation, no N/V/F/C/SOB, No pain,    Subjective:  Patient presents today status post right second toe amputation performed inpatient at [facility].  DOS: 08/26/2022.  Patient doing well.  No pain.  WBAT in surgical shoe  Patient states that while she was in the hospital she did sustain a fall and injured her right fifth toe with a superficial abrasion.  Past Medical History:  Diagnosis Date   Actinic keratosis    Aortic atherosclerosis (HCC) 05/25/2020   Arthritis    spinal stenosis   Cardiomyopathy - presumed to be nonischemic    a. 03/2020 Echo: EF 40-45%, gr2 DD. Nl RV fxn. Mildly dil LA. Mild MR/ao sclerosis; b. 03/2020 MV: EF 30-44%, no ischemia. Cor Ca2+ in pLAD.   CHF (congestive heart failure) (HCC)    COPD (chronic obstructive pulmonary disease) (HCC)    Coronary artery calcification seen on CT scan    a. 03/2020 MV: CT attenuation images show Ao and mod pLAD Ca2+.   Diabetes mellitus without complication (HCC)    Gastric ulcer 06/2010   treated with Prolisec- states no problems now   Hyperlipidemia    Hypertension    PCP Dr Julieanne Manson   [place]   Hypothyroidism    Left bundle branch block (LBBB)    Neuromuscular disorder (HCC)    slight numbness right toes- comes and goes   Peripheral vascular disease (HCC)    varicose veins left leg   Pneumonia    PSVT (paroxysmal supraventricular tachycardia)    a. 03/2020 Zio: Avg HR 87 (66-211).  4 beats NSVT.  43 PSVT episodes, longest 10h 93m @ avg of 153 bpm (max 211).  Seen by EP-->amio started (pt wished to avoid EPS/RFCA).   Seasonal allergies    Shortness of breath    Skin cancer    multiple from face   Squamous cell carcinoma of skin 03/23/2013   L dorsal hand   Squamous cell carcinoma of skin 02/04/2014   R forearm/in situ, L pretibial   Squamous cell carcinoma of skin 09/24/2018   R thumb   Squamous cell carcinoma of skin  09/08/2019   L forearm - ED&C   Squamous cell carcinoma of skin 09/08/2019   R dosal hand - ED&C   Squamous cell carcinoma of skin 06/20/2021   R cheek preauricular, excised    Past Surgical History:  Procedure Laterality Date   AMPUTATION TOE Right 08/26/2022   Procedure: AMPUTATION 2ND  TOE RIGHT FOOT;  Surgeon: Felecia Shelling, DPM;  Location: [facility] ORS;  Service: Podiatry;  Laterality: Right;   BACK SURGERY     4/12  Dr Simonne Come- for lumbar stenosis   BREAST CYST ASPIRATION Left 1965   negative   BREAST SURGERY  1962   lumpectomy with biopsy   left   CARPAL TUNNEL RELEASE     right   COLONOSCOPY     COLONOSCOPY WITH PROPOFOL N/A 06/30/2015   Procedure: COLONOSCOPY WITH PROPOFOL;  Surgeon: Wallace Cullens, MD;  Location: [facility] ENDOSCOPY;  Service: Gastroenterology;  Laterality: N/A;   COLONOSCOPY WITH PROPOFOL N/A 01/22/2017   Procedure: COLONOSCOPY WITH PROPOFOL;  Surgeon: Christena Deem, MD;  Location: [facility] ENDOSCOPY;  Service: Endoscopy;  Laterality: N/A;   COLONOSCOPY WITH PROPOFOL N/A 01/28/2019   Procedure: COLONOSCOPY WITH PROPOFOL;  Surgeon: Toledo, Boykin Nearing, MD;  Location: [facility] ENDOSCOPY;  Service: Gastroenterology;  Laterality: N/A;   ESOPHAGOGASTRODUODENOSCOPY     ESOPHAGOGASTRODUODENOSCOPY (EGD) WITH PROPOFOL N/A 01/28/2019   Procedure: ESOPHAGOGASTRODUODENOSCOPY (EGD) WITH PROPOFOL;  Surgeon: Toledo, Boykin Nearing, MD;  Location: [facility] ENDOSCOPY;  Service: Gastroenterology;  Laterality: N/A;   EYE SURGERY     cataract extraction with IOL bilaterally   JOINT REPLACEMENT     LOWER EXTREMITY ANGIOGRAPHY Right 08/24/2022   Procedure: Lower Extremity Angiography;  Surgeon: Annice Needy, MD;  Location: [facility] INVASIVE CV LAB;  Service: Cardiovascular;  Laterality: Right;   LUMBAR LAMINECTOMY/DECOMPRESSION MICRODISCECTOMY  05/24/2011   Procedure: LUMBAR LAMINECTOMY/DECOMPRESSION MICRODISCECTOMY 2 LEVELS;  Surgeon: Drucilla Schmidt, MD;  Location: [facility] ORS;  Service: Orthopedics;   Laterality: N/A;  L2-L3, L3-L4 (x-ray)   RIGHT OOPHORECTOMY     due to STAPH INFECTION    Allergies  Allergen Reactions   Oxycodone Other (See Comments)    Mental status change   Prednisone     swelling, bruising.    Objective/Physical Exam Neurovascular status intact.  Incision well coapted with sutures intact. No sign of infectious process noted. No dehiscence. No active bleeding noted.    There is an ulcer noted to the lateral portion of the right fifth toe that measures approximately 1.0 x 1.0 x 0.1 cm.  It appears stable with good healing potential   Assessment: 1. s/p right toe amputation inpatient at [facility] hospital. DOS: 08/26/2022 2.  Ulcer fifth digit right foot  -Light debridement of the ulcer to the right fifth digit was performed today using a tissue nipper.  Triple antibiotic and a Band-Aid was applied.  Recommend triple antibiotic and a Band-Aid daily -Patient may begin washing and showering and getting the foot wet -Continue WBAT surgical shoe -Return to clinic 1 week suture removal   Felecia Shelling, DPM [facility]  Dr. Felecia Shelling, DPM    [address] [place], [place] [address]                Office [phone]  Fax [phone]

## 2022-09-10 ENCOUNTER — Inpatient Hospital Stay: Payer: Medicare HMO | Admitting: Family Medicine

## 2022-09-12 ENCOUNTER — Ambulatory Visit (INDEPENDENT_AMBULATORY_CARE_PROVIDER_SITE_OTHER): Payer: Medicare HMO | Admitting: Podiatry

## 2022-09-12 DIAGNOSIS — M869 Osteomyelitis, unspecified: Secondary | ICD-10-CM

## 2022-09-12 NOTE — Progress Notes (Signed)
Chief Complaint  Patient presents with   Routine Post Op    Post Follow up, sutures removed today and patient is stll wearing surgical shoe.     Subjective:  Patient presents today status post right second toe amputation performed inpatient at [facility].  DOS: 08/26/2022.  Patient continues to do well.  She does state that she has had an increase lower extremity edema noted to the bilateral lower extremities.  She has no pain associated to the amputation site and she believes that the ulcer to the fifth toe is doing very well.  Past Medical History:  Diagnosis Date   Actinic keratosis    Aortic atherosclerosis (HCC) 05/25/2020   Arthritis    spinal stenosis   Cardiomyopathy - presumed to be nonischemic    a. 03/2020 Echo: EF 40-45%, gr2 DD. Nl RV fxn. Mildly dil LA. Mild MR/ao sclerosis; b. 03/2020 MV: EF 30-44%, no ischemia. Cor Ca2+ in pLAD.   CHF (congestive heart failure) (HCC)    COPD (chronic obstructive pulmonary disease) (HCC)    Coronary artery calcification seen on CT scan    a. 03/2020 MV: CT attenuation images show Ao and mod pLAD Ca2+.   Diabetes mellitus without complication (HCC)    Gastric ulcer 06/2010   treated with Prolisec- states no problems now   Hyperlipidemia    Hypertension    PCP Dr Julieanne Manson   [place]   Hypothyroidism    Left bundle branch block (LBBB)    Neuromuscular disorder (HCC)    slight numbness right toes- comes and goes   Peripheral vascular disease (HCC)    varicose veins left leg   Pneumonia    PSVT (paroxysmal supraventricular tachycardia)    a. 03/2020 Zio: Avg HR 87 (66-211).  4 beats NSVT.  43 PSVT episodes, longest 10h 57m @ avg of 153 bpm (max 211).  Seen by EP-->amio started (pt wished to avoid EPS/RFCA).   Seasonal allergies    Shortness of breath    Skin cancer    multiple from face   Squamous cell carcinoma of skin 03/23/2013   L dorsal hand   Squamous cell carcinoma of skin 02/04/2014   R forearm/in situ, L  pretibial   Squamous cell carcinoma of skin 09/24/2018   R thumb   Squamous cell carcinoma of skin 09/08/2019   L forearm - ED&C   Squamous cell carcinoma of skin 09/08/2019   R dosal hand - ED&C   Squamous cell carcinoma of skin 06/20/2021   R cheek preauricular, excised    Past Surgical History:  Procedure Laterality Date   AMPUTATION TOE Right 08/26/2022   Procedure: AMPUTATION 2ND  TOE RIGHT FOOT;  Surgeon: Felecia Shelling, DPM;  Location: [facility] ORS;  Service: Podiatry;  Laterality: Right;   BACK SURGERY     4/12  Dr Simonne Come- for lumbar stenosis   BREAST CYST ASPIRATION Left 1965   negative   BREAST SURGERY  1962   lumpectomy with biopsy   left   CARPAL TUNNEL RELEASE     right   COLONOSCOPY     COLONOSCOPY WITH PROPOFOL N/A 06/30/2015   Procedure: COLONOSCOPY WITH PROPOFOL;  Surgeon: Wallace Cullens, MD;  Location: [facility] ENDOSCOPY;  Service: Gastroenterology;  Laterality: N/A;   COLONOSCOPY WITH PROPOFOL N/A 01/22/2017   Procedure: COLONOSCOPY WITH PROPOFOL;  Surgeon: Christena Deem, MD;  Location: [facility] ENDOSCOPY;  Service: Endoscopy;  Laterality: N/A;   COLONOSCOPY WITH PROPOFOL N/A 01/28/2019   Procedure:  COLONOSCOPY WITH PROPOFOL;  Surgeon: Toledo, Boykin Nearing, MD;  Location: [facility] ENDOSCOPY;  Service: Gastroenterology;  Laterality: N/A;   ESOPHAGOGASTRODUODENOSCOPY     ESOPHAGOGASTRODUODENOSCOPY (EGD) WITH PROPOFOL N/A 01/28/2019   Procedure: ESOPHAGOGASTRODUODENOSCOPY (EGD) WITH PROPOFOL;  Surgeon: Toledo, Boykin Nearing, MD;  Location: [facility] ENDOSCOPY;  Service: Gastroenterology;  Laterality: N/A;   EYE SURGERY     cataract extraction with IOL bilaterally   JOINT REPLACEMENT     LOWER EXTREMITY ANGIOGRAPHY Right 08/24/2022   Procedure: Lower Extremity Angiography;  Surgeon: Annice Needy, MD;  Location: [facility] INVASIVE CV LAB;  Service: Cardiovascular;  Laterality: Right;   LUMBAR LAMINECTOMY/DECOMPRESSION MICRODISCECTOMY  05/24/2011   Procedure: LUMBAR LAMINECTOMY/DECOMPRESSION  MICRODISCECTOMY 2 LEVELS;  Surgeon: Drucilla Schmidt, MD;  Location: [facility];  Service: Orthopedics;  Laterality: N/A;  L2-L3, L3-L4 (x-ray)   RIGHT OOPHORECTOMY     due to STAPH INFECTION    Allergies  Allergen Reactions   Oxycodone Other (See Comments)    Mental status change   Prednisone     swelling, bruising.    Objective/Physical Exam Neurovascular status intact.  Incision well coapted with sutures intact.  After removal of the sutures the incision is nicely healed  Ulcer to the lateral portion of the right fifth toe has resolved and healed completely.  Complete reepithelialization has occurred.   Assessment: 1. s/p right toe amputation inpatient at [facility] hospital. DOS: 08/26/2022 2.  Ulcer fifth digit right foot  -Patient evaluated.  Sutures removed -Both the amputation site of the second toe as well as the superficial ulcer to the fifth digit have resolved and healed completely.  The incision is nicely healed and complete reepithelialization of the ulcer noted. -Recommend good supportive shoes and sneakers.  Advised against going barefoot -Patient is going to follow-up with PCP for acute exacerbation of chronic lower extremity edema -Return to clinic as needed   Felecia Shelling, DPM [facility]  Dr. Felecia Shelling, DPM    [address] [place], [place] [address]                Office [phone]  Fax [phone]

## 2022-09-13 ENCOUNTER — Ambulatory Visit (INDEPENDENT_AMBULATORY_CARE_PROVIDER_SITE_OTHER): Payer: Medicare HMO | Admitting: Vascular Surgery

## 2022-09-25 ENCOUNTER — Ambulatory Visit: Payer: Self-pay

## 2022-09-25 ENCOUNTER — Ambulatory Visit: Payer: Medicare HMO | Admitting: Physician Assistant

## 2022-09-25 DIAGNOSIS — W19XXXA Unspecified fall, initial encounter: Secondary | ICD-10-CM

## 2022-09-25 DIAGNOSIS — Y92009 Unspecified place in unspecified non-institutional (private) residence as the place of occurrence of the external cause: Secondary | ICD-10-CM

## 2022-09-25 NOTE — Addendum Note (Signed)
Addended by: Bing Neighbors on: 09/25/2022 03:49 PM   Modules accepted: Orders

## 2022-09-25 NOTE — Telephone Encounter (Signed)
  Chief Complaint: Fall - bruising on rib cage Symptoms: Bruising Frequency: Saturday evening Pertinent Negatives: Patient denies  Disposition: [] ED /[] Urgent Care (no appt availability in office) / [] Appointment(In office/virtual)/ []  [place] [facility]/ [] Home Care/ [] Refused Recommended Disposition /[] [place] [facility]/ [x]  Follow-up with PCP Additional Notes: Call from [place] [facility] [place]. Pt had a fall on Saturday evening while walking to bathroom with walker. Unsure what caused fall. Pt describes fall as gentle. Pt hit the bed with her left side ribcage area.  Noreene Larsson states that pt seems fine. There is some bruising. Pt has pain of 4/10 with a deep breath. PT has an upcoming OV on the 12th.  Pt will call back if a sooner appt is needed.    Reason for Disposition  Small bruise is present  Answer Assessment - Initial Assessment Questions 1. MECHANISM: "How did the fall happen?"     Walking with walker, gentle fall 2. DOMESTIC VIOLENCE AND ELDER ABUSE SCREENING: "Did you fall because someone pushed you or tried to hurt you?" If Yes, ask: "Are you safe now?"     no 3. ONSET: "When did the fall happen?" (e.g., minutes, hours, or days ago)     Saturday evening 4. LOCATION: "What part of the body hit the ground?" (e.g., back, buttocks, head, hips, knees, hands, head, stomach)     Left side of chest - rib cage area 5. INJURY: "Did you hurt (injure) yourself when you fell?" If Yes, ask: "What did you injure? Tell me more about this?" (e.g., body area; type of injury; pain severity)"     Yes - bruising 6. PAIN: "Is there any pain?" If Yes, ask: "How bad is the pain?" (e.g., Scale 1-10; or mild,  moderate, severe)   - NONE (0): No pain   - MILD (1-3): Doesn't interfere with normal activities    - MODERATE (4-7): Interferes with normal activities or awakens from sleep    - SEVERE (8-10): Excruciating pain, unable to do any normal activities      4/10 with a deep breath 9. OTHER  SYMPTOMS: "Do you have any other symptoms?" (e.g., dizziness, fever, weakness; new onset or worsening).      no 10. CAUSE: "What do you think caused the fall (or falling)?" (e.g., tripped, dizzy spell)       Unsure  Protocols used: Falls and [facility]

## 2022-09-25 NOTE — Telephone Encounter (Signed)
Tried calling patient but would not answer after answering call. Ok for [facility] nurse to advise.

## 2022-09-25 NOTE — Telephone Encounter (Signed)
Recommend imaging prior to appt on 10/05/22.   Please advise patient to report to [facility] located at:  [address]  [place], [place] [address]   Our office will follow up with  results once available.

## 2022-09-26 NOTE — Telephone Encounter (Signed)
Called, spoke with patient about provider's recommendation on getting imaging for the recent fall the patient had. Patient states that she dose not have transportation at the moment and she can get there when she can. Advise patient to get the x- ray imaging before her OV 10/05/22. Patient verbalized understanding.

## 2022-09-28 ENCOUNTER — Inpatient Hospital Stay: Payer: Medicare HMO | Admitting: Family Medicine

## 2022-10-01 ENCOUNTER — Ambulatory Visit (INDEPENDENT_AMBULATORY_CARE_PROVIDER_SITE_OTHER): Payer: Commercial Indemnity | Admitting: Vascular Surgery

## 2022-10-01 ENCOUNTER — Ambulatory Visit
Admission: RE | Admit: 2022-10-01 | Discharge: 2022-10-01 | Disposition: A | Discharge status: Home / Self Care | Payer: Medicare HMO | Attending: Family Medicine | Admitting: Family Medicine

## 2022-10-01 ENCOUNTER — Telehealth: Payer: Self-pay | Admitting: Family Medicine

## 2022-10-01 ENCOUNTER — Ambulatory Visit: Admission: RE | Admit: 2022-10-01 | Payer: Medicare HMO | Source: Ambulatory Visit

## 2022-10-01 DIAGNOSIS — Y92009 Unspecified place in unspecified non-institutional (private) residence as the place of occurrence of the external cause: Secondary | ICD-10-CM

## 2022-10-01 DIAGNOSIS — W19XXXA Unspecified fall, initial encounter: Secondary | ICD-10-CM | POA: Diagnosis not present

## 2022-10-01 DIAGNOSIS — M19012 Primary osteoarthritis, left shoulder: Secondary | ICD-10-CM | POA: Diagnosis not present

## 2022-10-01 DIAGNOSIS — S270XXA Traumatic pneumothorax, initial encounter: Secondary | ICD-10-CM | POA: Insufficient documentation

## 2022-10-01 DIAGNOSIS — J4489 Other specified chronic obstructive pulmonary disease: Secondary | ICD-10-CM | POA: Insufficient documentation

## 2022-10-01 DIAGNOSIS — S2232XA Fracture of one rib, left side, initial encounter for closed fracture: Secondary | ICD-10-CM | POA: Insufficient documentation

## 2022-10-01 DIAGNOSIS — M19011 Primary osteoarthritis, right shoulder: Secondary | ICD-10-CM | POA: Diagnosis not present

## 2022-10-01 DIAGNOSIS — S301XXA Contusion of abdominal wall, initial encounter: Secondary | ICD-10-CM | POA: Diagnosis present

## 2022-10-01 NOTE — Telephone Encounter (Signed)
Ok for verbal orders.    Toshia Larkin [name], MD  [facility]  

## 2022-10-01 NOTE — Telephone Encounter (Signed)
Zacharia calling from [facility] is calling to request Verbal for OT.  Frequency- 1 W 4 Weeks  CB- [phone] Verbal ok on VM

## 2022-10-05 ENCOUNTER — Ambulatory Visit (INDEPENDENT_AMBULATORY_CARE_PROVIDER_SITE_OTHER): Payer: Medicare HMO | Admitting: Family Medicine

## 2022-10-05 ENCOUNTER — Encounter: Payer: Self-pay | Admitting: Family Medicine

## 2022-10-05 VITALS — BP 121/70 | HR 75 | Ht 64.0 in | Wt 185.0 lb

## 2022-10-05 DIAGNOSIS — Z9181 History of falling: Secondary | ICD-10-CM

## 2022-10-05 DIAGNOSIS — M869 Osteomyelitis, unspecified: Secondary | ICD-10-CM

## 2022-10-05 HISTORY — DX: History of falling: Z91.81

## 2022-10-05 NOTE — Assessment & Plan Note (Signed)
Repeat CMP and CBC following hospital stay and noted hx of falls. Known anemia previously and c/f electrolyte abnormalities given previous hypovolemia and FTT. Pt s/p 9 days inpatient with 28 days at [facility] rehab. Reports fall at home s/p Xray to ribs; slight ecchymosis remains. Not TTP. Pt able to lay in bed now to sleep; slept up in chair x 2 days.

## 2022-10-05 NOTE — Assessment & Plan Note (Addendum)
Repeat CMP and CBC following hospital stay and noted hx of falls. Known anemia previously and c/f electrolyte abnormalities given previous hypovolemia and FTT. Pt s/p 9 days inpatient with 28 days at [facility] rehab. Reports fall at home s/p Xray to ribs; slight ecchymosis remains. Not TTP. Pt able to lay in bed now to sleep; slept up in chair x 2 days. Fall 6/28, called 7/2, Xray 7/8

## 2022-10-05 NOTE — Progress Notes (Signed)
Established patient visit   Patient: Kathryn [name]   DOB: 1933/07/10   89 y.o. Female  MRN: [id number] Visit Date: 10/05/2022  Today's healthcare provider: Jacky Kindle, FNP  Introduced to nurse practitioner role and practice setting.  All questions answered.  Discussed provider/patient relationship and expectations.  Chief Complaint  Patient presents with   Hospitalization Follow-up    Pt stated--doing much better especially after rehab.   Subjective    HPI HPI     Hospitalization Follow-up    Additional comments: Pt stated--doing much better especially after rehab.      Last edited by Shelly Bombard, CMA on 10/05/2022  3:31 PM.      Medications: Outpatient Medications Prior to Visit  Medication Sig   acetaminophen (TYLENOL) 500 MG tablet Take 1,000 mg by mouth every 6 (six) hours as needed. Pain    albuterol (PROVENTIL) (2.5 MG/3ML) 0.083% nebulizer solution USE 1 VIAL VIA NEBULIZER EVERY 6 HOURS AS NEEDED FOR WHEEZING OR SHORTNESS OF BREATH   amiodarone (PACERONE) 200 MG tablet Take 0.5 tablets (100 mg total) by mouth daily.   aspirin 81 MG EC tablet Take 81 mg by mouth daily. Swallow whole.   atorvastatin (LIPITOR) 40 MG tablet TAKE 1 TABLET(40 MG) BY MOUTH DAILY   carvedilol (COREG) 6.25 MG tablet TAKE 1 TABLET(6.25 MG) BY MOUTH TWICE DAILY WITH A MEAL   clopidogrel (PLAVIX) 75 MG tablet Take 1 tablet (75 mg total) by mouth daily.   dapagliflozin propanediol (FARXIGA) 10 MG TABS tablet TAKE 1 TABLET(10 MG) BY MOUTH DAILY   gabapentin (NEURONTIN) 400 MG capsule Take 1 capsule (400 mg total) by mouth 4 (four) times daily.   glucose blood (CONTOUR NEXT TEST) test strip Check sugar once daily  DX E11.9   HYDROcodone-acetaminophen (NORCO/VICODIN) 5-325 MG tablet Take 1 tablet by mouth every 4 (four) hours as needed for moderate pain or severe pain.   hydrOXYzine (ATARAX) 10 MG tablet TAKE 1 TO 2 TABLETS(10 TO 20 MG) BY MOUTH EVERY 6 HOURS AS NEEDED   levothyroxine  (SYNTHROID) 100 MCG tablet TAKE 1 TABLET(100 MCG) BY MOUTH DAILY BEFORE BREAKFAST   metFORMIN (GLUCOPHAGE) 1000 MG tablet TAKE 1 TABLET(1000 MG) BY MOUTH DAILY WITH SUPPER   SPIRIVA HANDIHALER 18 MCG inhalation capsule PLACE 1 CAPSULE INTO INHALER AND INHALE EVERY DAY AS NEEDED   torsemide (DEMADEX) 20 MG tablet Take 40 mg by mouth daily. May take an extra 20 mg after lunch as needed for swelling.   No facility-administered medications prior to visit.    Review of Systems    Objective    BP 121/70 (BP Location: Left Arm, Patient Position: Sitting, Cuff Size: Large)   Pulse 75   Ht 5\' 4"  (1.626 m)   Wt 185 lb (83.9 kg)   SpO2 98%   BMI 31.76 kg/m   Physical Exam Vitals and nursing note reviewed.  Constitutional:      General: She is not in acute distress.    Appearance: Normal appearance. She is overweight. She is not ill-appearing, toxic-appearing or diaphoretic.  HENT:     Head: Normocephalic and atraumatic.  Cardiovascular:     Rate and Rhythm: Normal rate and regular rhythm.     Pulses: Normal pulses.     Heart sounds: Normal heart sounds. No murmur heard.    No friction rub. No gallop.  Pulmonary:     Effort: Pulmonary effort is normal. No respiratory distress.     Breath  sounds: Normal breath sounds. No stridor. No wheezing, rhonchi or rales.  Chest:     Chest wall: No tenderness.  Musculoskeletal:        General: No swelling, tenderness, deformity or signs of injury. Normal range of motion.     Right lower leg: No edema.     Left lower leg: No edema.  Skin:    General: Skin is warm and dry.     Capillary Refill: Capillary refill takes less than 2 seconds.     Coloration: Skin is not jaundiced or pale.     Findings: No bruising, erythema, lesion or rash.     Comments: S/p R 2nd toe amputation s/s osteo  Neurological:     Mental Status: She is alert and oriented to person, place, and time. Mental status is at baseline.     Cranial Nerves: No cranial nerve deficit.      Sensory: No sensory deficit.     Motor: No weakness.     Coordination: Coordination normal.     Gait: Gait abnormal.  Psychiatric:        Mood and Affect: Mood normal.        Behavior: Behavior normal.        Thought Content: Thought content normal.        Judgment: Judgment normal.     No results found for any visits on 10/05/22.  Assessment & Plan     Problem List Items Addressed This Visit       Musculoskeletal and Integument   Osteomyelitis of toe of right foot (HCC) - Primary    Repeat CMP and CBC following hospital stay and noted hx of falls. Known anemia previously and c/f electrolyte abnormalities given previous hypovolemia and FTT. Pt s/p 9 days inpatient with 28 days at [facility] rehab. Reports fall at home s/p Xray to ribs; slight ecchymosis remains. Not TTP. Pt able to lay in bed now to sleep; slept up in chair x 2 days.      Relevant Orders   CBC with Differential/Platelet   Comprehensive Metabolic Panel (CMET)     Other   Personal history of fall    Repeat CMP and CBC following hospital stay and noted hx of falls. Known anemia previously and c/f electrolyte abnormalities given previous hypovolemia and FTT. Pt s/p 9 days inpatient with 28 days at [facility] rehab. Reports fall at home s/p Xray to ribs; slight ecchymosis remains. Not TTP. Pt able to lay in bed now to sleep; slept up in chair x 2 days. Fall 6/28, called 7/2, Xray 7/8      No follow-ups on file.     Leilani Merl, FNP, have reviewed all documentation for this visit. The documentation on 10/05/22 for the exam, diagnosis, procedures, and orders are all accurate and complete.  Jacky Kindle, FNP  [facility] [phone] (phone) [phone] (fax)  [facility]

## 2022-10-06 LAB — CBC WITH DIFFERENTIAL/PLATELET
Basophils Absolute: 0.1 10*3/uL (ref 0.0–0.2)
Basos: 1 %
EOS (ABSOLUTE): 0.3 10*3/uL (ref 0.0–0.4)
Eos: 4 %
Hematocrit: 35.6 % (ref 34.0–46.6)
Hemoglobin: 10.5 g/dL — ABNORMAL LOW (ref 11.1–15.9)
Immature Grans (Abs): 0 10*3/uL (ref 0.0–0.1)
Immature Granulocytes: 0 %
Lymphocytes Absolute: 1.8 10*3/uL (ref 0.7–3.1)
Lymphs: 30 %
MCH: 23.5 pg — ABNORMAL LOW (ref 26.6–33.0)
MCHC: 29.5 g/dL — ABNORMAL LOW (ref 31.5–35.7)
MCV: 80 fL (ref 79–97)
Monocytes Absolute: 0.5 10*3/uL (ref 0.1–0.9)
Monocytes: 9 %
Neutrophils Absolute: 3.4 10*3/uL (ref 1.4–7.0)
Neutrophils: 56 %
Platelets: 350 10*3/uL (ref 150–450)
RBC: 4.47 x10E6/uL (ref 3.77–5.28)
RDW: 18.6 % — ABNORMAL HIGH (ref 11.7–15.4)
WBC: 6.1 10*3/uL (ref 3.4–10.8)

## 2022-10-06 LAB — COMPREHENSIVE METABOLIC PANEL
ALT: 9 IU/L (ref 0–32)
AST: 16 IU/L (ref 0–40)
Albumin: 4.3 g/dL (ref 3.7–4.7)
Alkaline Phosphatase: 119 IU/L (ref 44–121)
BUN/Creatinine Ratio: 15 (ref 12–28)
BUN: 25 mg/dL (ref 8–27)
Bilirubin Total: <0.2 mg/dL (ref 0.0–1.2)
CO2: 26 mmol/L (ref 20–29)
Calcium: 10.2 mg/dL (ref 8.7–10.3)
Chloride: 102 mmol/L (ref 96–106)
Creatinine, Ser: 1.7 mg/dL — ABNORMAL HIGH (ref 0.57–1.00)
Globulin, Total: 2.4 g/dL (ref 1.5–4.5)
Glucose: 141 mg/dL — ABNORMAL HIGH (ref 70–99)
Potassium: 5 mmol/L (ref 3.5–5.2)
Sodium: 142 mmol/L (ref 134–144)
Total Protein: 6.7 g/dL (ref 6.0–8.5)
eGFR: 29 mL/min/{1.73_m2} — ABNORMAL LOW (ref >59)

## 2022-10-07 ENCOUNTER — Other Ambulatory Visit: Payer: Self-pay | Admitting: Family Medicine

## 2022-10-07 DIAGNOSIS — R7989 Other specified abnormal findings of blood chemistry: Secondary | ICD-10-CM

## 2022-10-07 NOTE — Progress Notes (Signed)
Labs have stabilized with exception of your creatinine; recommend heart healthy diet, water as primary drink and plan to repeat BMP in 2-4 weeks.

## 2022-10-08 ENCOUNTER — Other Ambulatory Visit: Payer: Self-pay

## 2022-10-08 DIAGNOSIS — R7989 Other specified abnormal findings of blood chemistry: Secondary | ICD-10-CM

## 2022-10-08 DIAGNOSIS — E861 Hypovolemia: Secondary | ICD-10-CM

## 2022-10-09 ENCOUNTER — Telehealth: Payer: Self-pay

## 2022-10-09 NOTE — Telephone Encounter (Signed)
Patient advise. She reports feeling much better.

## 2022-10-09 NOTE — Telephone Encounter (Signed)
-----   Message from [facility] [name] sent at 10/05/2022  5:14 PM EDT ----- Rib fractures noted on left 5th rib and possibly 4th rib   Lungs show COPD changes

## 2022-10-16 ENCOUNTER — Other Ambulatory Visit: Payer: Self-pay | Admitting: Family

## 2022-10-16 ENCOUNTER — Other Ambulatory Visit: Payer: Self-pay | Admitting: Family Medicine

## 2022-10-16 DIAGNOSIS — E039 Hypothyroidism, unspecified: Secondary | ICD-10-CM

## 2022-10-17 NOTE — Telephone Encounter (Signed)
Requested medication (s) are due for refill today: yes  Requested medication (s) are on the active medication list: yes  Last refill:  02/26/22 #90 1 refills  Future visit scheduled: no  Notes to clinic:  last ordered by A. Rumball, DO 02/26/22. Do you want to order Rx?     Requested Prescriptions  Pending Prescriptions Disp Refills   levothyroxine (SYNTHROID) 100 MCG tablet [Pharmacy Med Name: LEVOTHYROXINE 0.100MG  ( ) TAB] 90 tablet 1    Sig: TAKE 1 TABLET(100 MCG) BY MOUTH DAILY BEFORE BREAKFAST     Endocrinology:  Hypothyroid Agents Failed - 10/16/2022  9:37 AM      Failed - TSH in normal range and within 360 days    TSH  Date Value Ref Range Status  08/22/2022 0.174 (L) 0.450 - 4.500 uIU/mL Final         Passed - Valid encounter within last 12 months    Recent Outpatient Visits           1 week ago Osteomyelitis of toe of right foot [facility])   [facility] Merita Norton T, FNP   1 month ago Hypotension due to hypovolemia   [facility] Jacky Kindle, FNP   6 months ago Hospital discharge follow-up   [facility] [name], [place], MD   9 months ago Lower limb ulcer, ankle, right, with unspecified severity [facility])   [facility] Dense M, DO   10 months ago Need for immunization against influenza   [facility] Bosie Clos, MD

## 2022-10-18 ENCOUNTER — Ambulatory Visit (INDEPENDENT_AMBULATORY_CARE_PROVIDER_SITE_OTHER): Payer: Commercial Indemnity | Admitting: Vascular Surgery

## 2022-10-26 ENCOUNTER — Other Ambulatory Visit: Payer: Self-pay | Admitting: Family Medicine

## 2022-10-31 ENCOUNTER — Telehealth: Payer: Self-pay | Admitting: Podiatry

## 2022-10-31 NOTE — Telephone Encounter (Signed)
[name] from the [facility] homecare called and need to speak with you about this pt, phone number is [phone]

## 2022-11-01 ENCOUNTER — Other Ambulatory Visit: Payer: Self-pay | Admitting: Family Medicine

## 2022-11-01 NOTE — Telephone Encounter (Signed)
Medication Refill - Medication: gabapentin (NEURONTIN) 400 MG capsule   Has the patient contacted their pharmacy? Yes.   They sent a request but not showing it recvd  Preferred Pharmacy (with phone number or street name):  [facility][id number] [place], [place] - [address] AT [facility] OF [address] [facility] [address] Phone: [phone]  Fax: [phone]     Has the patient been seen for an appointment in the last year OR does the patient have an upcoming appointment? Yes.    Agent: Please be advised that RX refills may take up to 3 business days. We ask that you follow-up with your pharmacy.

## 2022-11-02 NOTE — Telephone Encounter (Signed)
Request was refilled 11/02/22, duplicate request.  Requested Prescriptions  Pending Prescriptions Disp Refills   gabapentin (NEURONTIN) 400 MG capsule 120 capsule 2    Sig: Take 1 capsule (400 mg total) by mouth 4 (four) times daily.     Neurology: Anticonvulsants - gabapentin Failed - 11/02/2022  7:47 AM      Failed - Cr in normal range and within 360 days    Creatinine  Date Value Ref Range Status  07/09/2014 0.85 mg/dL Final    Comment:    5.18-8.41 NOTE: New Reference Range  06/01/14    Creatinine, Ser  Date Value Ref Range Status  10/26/2022 1.32 (H) 0.57 - 1.00 mg/dL Final         Passed - Completed PHQ-2 or PHQ-9 in the last 360 days      Passed - Valid encounter within last 12 months    Recent Outpatient Visits           4 weeks ago Osteomyelitis of toe of right foot [facility])    [facility] Merita Norton T, FNP   2 months ago Hypotension due to hypovolemia   [facility] Jacky Kindle, FNP   7 months ago Hospital discharge follow-up   [facility] [name], [place], MD   9 months ago Lower limb ulcer, ankle, right, with unspecified severity [facility])   [facility] [place] Dense M, DO   10 months ago Need for immunization against influenza   [facility] Bosie Clos, MD

## 2022-11-02 NOTE — Telephone Encounter (Signed)
Requested Prescriptions  Pending Prescriptions Disp Refills   gabapentin (NEURONTIN) 400 MG capsule [Pharmacy Med Name: GABAPENTIN 400MG  CAPSULES] 120 capsule 2    Sig: TAKE 1 CAPSULE(400 MG) BY MOUTH FOUR TIMES DAILY     Neurology: Anticonvulsants - gabapentin Failed - 11/01/2022  9:33 AM      Failed - Cr in normal range and within 360 days    Creatinine  Date Value Ref Range Status  07/09/2014 0.85 mg/dL Final    Comment:    1.47-8.29 NOTE: New Reference Range  06/01/14    Creatinine, Ser  Date Value Ref Range Status  10/26/2022 1.32 (H) 0.57 - 1.00 mg/dL Final         Passed - Completed PHQ-2 or PHQ-9 in the last 360 days      Passed - Valid encounter within last 12 months    Recent Outpatient Visits           4 weeks ago Osteomyelitis of toe of right foot [facility])   [facility] Merita Norton T, FNP   2 months ago Hypotension due to hypovolemia   [facility] Jacky Kindle, FNP   7 months ago Hospital discharge follow-up   [facility] [name], [place], MD   9 months ago Lower limb ulcer, ankle, right, with unspecified severity [facility])   [facility] [place] Dense M, DO   10 months ago Need for immunization against influenza   [facility] Bosie Clos, MD

## 2022-11-03 ENCOUNTER — Other Ambulatory Visit: Payer: Self-pay | Admitting: Cardiovascular Disease

## 2022-11-05 ENCOUNTER — Telehealth: Payer: Self-pay

## 2022-11-05 ENCOUNTER — Other Ambulatory Visit: Payer: Self-pay | Admitting: Family Medicine

## 2022-11-05 MED ORDER — GABAPENTIN 400 MG PO CAPS: 400.0000 mg | ORAL_CAPSULE | Freq: Four times a day (QID) | ORAL | 2 refills | Status: DC

## 2022-11-05 NOTE — Telephone Encounter (Signed)
Copied from CRM [id number]. Topic: General - Other >> Nov 05, 2022 12:48 PM Everette C wrote: Reason for CRM: The patient('s daughter in law Happy) has been directed to contact their PCP to follow up on the submission of a previous refill request of gabapentin (NEURONTIN) 400 MG capsule [[id number]]  Please contact the patient further when possible

## 2022-11-05 NOTE — Telephone Encounter (Signed)
Gabapentin 400mg  three times daily, 90 day supply sent to [facility]

## 2022-11-05 NOTE — Telephone Encounter (Signed)
Pt's daughter in law is calling in because she has reached out to [facility] multiple times and they say they don't have a prescription for gabapentin (NEURONTIN) 400 MG capsule [[id number]] even though it's showing it was received by the pharmacy. Pt's daughter in law is requesting another prescription be sent in due to pt being in pain and needing this medication.

## 2022-11-06 ENCOUNTER — Other Ambulatory Visit: Payer: Self-pay | Admitting: Cardiovascular Disease

## 2022-11-06 NOTE — Telephone Encounter (Signed)
Please contact pt for future appointment. Pt due for f/u.  

## 2022-11-08 DIAGNOSIS — I5020 Unspecified systolic (congestive) heart failure: Secondary | ICD-10-CM

## 2022-11-08 DIAGNOSIS — E1369 Other specified diabetes mellitus with other specified complication: Secondary | ICD-10-CM

## 2022-11-08 DIAGNOSIS — E039 Hypothyroidism, unspecified: Secondary | ICD-10-CM

## 2022-11-08 DIAGNOSIS — Z4781 Encounter for orthopedic aftercare following surgical amputation: Secondary | ICD-10-CM

## 2022-11-08 DIAGNOSIS — D649 Anemia, unspecified: Secondary | ICD-10-CM

## 2022-11-08 DIAGNOSIS — I2510 Atherosclerotic heart disease of native coronary artery without angina pectoris: Secondary | ICD-10-CM

## 2022-11-08 DIAGNOSIS — E669 Obesity, unspecified: Secondary | ICD-10-CM

## 2022-11-08 DIAGNOSIS — K219 Gastro-esophageal reflux disease without esophagitis: Secondary | ICD-10-CM

## 2022-11-08 DIAGNOSIS — I110 Hypertensive heart disease with heart failure: Secondary | ICD-10-CM

## 2022-11-08 DIAGNOSIS — M869 Osteomyelitis, unspecified: Secondary | ICD-10-CM

## 2022-11-08 DIAGNOSIS — I480 Paroxysmal atrial fibrillation: Secondary | ICD-10-CM

## 2022-11-08 NOTE — Telephone Encounter (Signed)
Pt is being seen on 10/7

## 2022-11-09 ENCOUNTER — Other Ambulatory Visit: Payer: Self-pay | Admitting: Family Medicine

## 2022-11-09 DIAGNOSIS — E119 Type 2 diabetes mellitus without complications: Secondary | ICD-10-CM

## 2022-11-16 ENCOUNTER — Other Ambulatory Visit: Payer: Self-pay | Admitting: Family Medicine

## 2022-11-16 DIAGNOSIS — E119 Type 2 diabetes mellitus without complications: Secondary | ICD-10-CM

## 2022-11-20 ENCOUNTER — Other Ambulatory Visit: Payer: Self-pay | Admitting: Cardiovascular Disease

## 2022-11-20 NOTE — Telephone Encounter (Signed)
Please advise if ok to refill Historical medication. 

## 2022-12-10 ENCOUNTER — Ambulatory Visit (INDEPENDENT_AMBULATORY_CARE_PROVIDER_SITE_OTHER): Payer: Medicare HMO | Admitting: Vascular Surgery

## 2022-12-11 ENCOUNTER — Encounter (INDEPENDENT_AMBULATORY_CARE_PROVIDER_SITE_OTHER): Payer: Self-pay | Admitting: Vascular Surgery

## 2022-12-11 ENCOUNTER — Ambulatory Visit (INDEPENDENT_AMBULATORY_CARE_PROVIDER_SITE_OTHER): Payer: Medicare HMO | Admitting: Vascular Surgery

## 2022-12-11 VITALS — BP 134/74 | HR 75 | Resp 18 | Ht 64.0 in | Wt 185.0 lb

## 2022-12-11 DIAGNOSIS — I10 Essential (primary) hypertension: Secondary | ICD-10-CM

## 2022-12-11 DIAGNOSIS — I890 Lymphedema, not elsewhere classified: Secondary | ICD-10-CM

## 2022-12-11 DIAGNOSIS — E119 Type 2 diabetes mellitus without complications: Secondary | ICD-10-CM

## 2022-12-11 DIAGNOSIS — I7025 Atherosclerosis of native arteries of other extremities with ulceration: Secondary | ICD-10-CM

## 2022-12-11 DIAGNOSIS — E785 Hyperlipidemia, unspecified: Secondary | ICD-10-CM | POA: Diagnosis not present

## 2022-12-11 NOTE — Assessment & Plan Note (Signed)
blood glucose control important in reducing the progression of atherosclerotic disease. Also, involved in wound healing. On appropriate medications.  

## 2022-12-11 NOTE — Assessment & Plan Note (Signed)
blood pressure control important in reducing the progression of atherosclerotic disease. On appropriate oral medications.

## 2022-12-11 NOTE — Progress Notes (Signed)
MRN : [id number]  Kathryn [name] is a 89 y.o. (Aug 03, 1933) female who presents with chief complaint of  Chief Complaint  Patient presents with   Follow-up    1-2 week f/u  .  History of Present Illness: Patient returns today in follow up of her vascular issues including peripheral arterial disease and chronic leg swelling with lymphedema.  Her swelling is bothering her more now than anything.  Her left leg is the most predominantly swollen leg.  She has had a venous evaluation 2 years ago.  She has been unable to get her compression socks on and off reliably.  The swelling progresses throughout the day.  She does get some swelling in her right leg as well.  No ulceration or infection currently.  She had nonhealing ulcerations on the left leg that prompted arterial revascularization about 4 months ago.  Her wounds have now healed.  She is not having a lot of pain.  She continues on aspirin, Plavix, and Lipitor.  Current Outpatient Medications  Medication Sig Dispense Refill   acetaminophen (TYLENOL) 500 MG tablet Take 1,000 mg by mouth every 6 (six) hours as needed. Pain      albuterol (PROVENTIL) (2.5 MG/3ML) 0.083% nebulizer solution USE 1 VIAL VIA NEBULIZER EVERY 6 HOURS AS NEEDED FOR WHEEZING OR SHORTNESS OF BREATH 150 mL 5   amiodarone (PACERONE) 200 MG tablet Take 0.5 tablets (100 mg total) by mouth daily. 45 tablet 3   aspirin 81 MG EC tablet Take 81 mg by mouth daily. Swallow whole.     atorvastatin (LIPITOR) 40 MG tablet TAKE 1 TABLET(40 MG) BY MOUTH DAILY 90 tablet 0   carvedilol (COREG) 6.25 MG tablet TAKE 1 TABLET(6.25 MG) BY MOUTH TWICE DAILY WITH A MEAL 180 tablet 3   clopidogrel (PLAVIX) 75 MG tablet Take 1 tablet (75 mg total) by mouth daily. 30 tablet 1   dapagliflozin propanediol (FARXIGA) 10 MG TABS tablet TAKE 1 TABLET(10 MG) BY MOUTH DAILY 90 tablet 3   gabapentin (NEURONTIN) 400 MG capsule Take 1 capsule (400 mg total) by mouth 4 (four) times daily. 120 capsule 2    glucose blood (CONTOUR NEXT TEST) test strip Check sugar once daily  DX E11.9 100 each 3   HYDROcodone-acetaminophen (NORCO/VICODIN) 5-325 MG tablet Take 1 tablet by mouth every 4 (four) hours as needed for moderate pain or severe pain. 30 tablet 0   hydrOXYzine (ATARAX) 10 MG tablet TAKE 1 TO 2 TABLETS(10 TO 20 MG) BY MOUTH EVERY 6 HOURS AS NEEDED 180 tablet 0   levothyroxine (SYNTHROID) 100 MCG tablet TAKE 1 TABLET(100 MCG) BY MOUTH DAILY BEFORE BREAKFAST 90 tablet 1   metFORMIN (GLUCOPHAGE) 1000 MG tablet TAKE 1 TABLET(1000 MG) BY MOUTH DAILY WITH SUPPER 90 tablet 0   SPIRIVA HANDIHALER 18 MCG inhalation capsule PLACE 1 CAPSULE INTO INHALER AND INHALE EVERY DAY AS NEEDED 30 capsule 12   torsemide (DEMADEX) 20 MG tablet Take 40 mg by mouth daily. May take an extra 20 mg after lunch as needed for swelling.     No current facility-administered medications for this visit.    Past Medical History:  Diagnosis Date   Actinic keratosis    Aortic atherosclerosis (HCC) 05/25/2020   Arthritis    spinal stenosis   Cardiomyopathy - presumed to be nonischemic    a. 03/2020 Echo: EF 40-45%, gr2 DD. Nl RV fxn. Mildly dil LA. Mild MR/ao sclerosis; b. 03/2020 MV: EF 30-44%, no ischemia. Cor Ca2+ in pLAD.  CHF (congestive heart failure) (HCC)    COPD (chronic obstructive pulmonary disease) (HCC)    Coronary artery calcification seen on CT scan    a. 03/2020 MV: CT attenuation images show Ao and mod pLAD Ca2+.   Diabetes mellitus without complication (HCC)    Gastric ulcer 06/2010   treated with Prolisec- states no problems now   Hyperlipidemia    Hypertension    PCP Dr Julieanne Manson   [place]   Hypothyroidism    Left bundle branch block (LBBB)    Neuromuscular disorder (HCC)    slight numbness right toes- comes and goes   Peripheral vascular disease (HCC)    varicose veins left leg   Pneumonia    PSVT (paroxysmal supraventricular tachycardia)    a. 03/2020 Zio: Avg HR 87 (66-211).  4 beats  NSVT.  43 PSVT episodes, longest 10h 26m @ avg of 153 bpm (max 211).  Seen by EP-->amio started (pt wished to avoid EPS/RFCA).   Seasonal allergies    Shortness of breath    Skin cancer    multiple from face   Squamous cell carcinoma of skin 03/23/2013   L dorsal hand   Squamous cell carcinoma of skin 02/04/2014   R forearm/in situ, L pretibial   Squamous cell carcinoma of skin 09/24/2018   R thumb   Squamous cell carcinoma of skin 09/08/2019   L forearm - ED&C   Squamous cell carcinoma of skin 09/08/2019   R dosal hand - ED&C   Squamous cell carcinoma of skin 06/20/2021   R cheek preauricular, excised    Past Surgical History:  Procedure Laterality Date   AMPUTATION TOE Right 08/26/2022   Procedure: AMPUTATION 2ND  TOE RIGHT FOOT;  Surgeon: Felecia Shelling, DPM;  Location: [facility] ORS;  Service: Podiatry;  Laterality: Right;   BACK SURGERY     4/12  Dr Simonne Come- for lumbar stenosis   BREAST CYST ASPIRATION Left 1965   negative   BREAST SURGERY  1962   lumpectomy with biopsy   left   CARPAL TUNNEL RELEASE     right   COLONOSCOPY     COLONOSCOPY WITH PROPOFOL N/A 06/30/2015   Procedure: COLONOSCOPY WITH PROPOFOL;  Surgeon: Wallace Cullens, MD;  Location: [facility] ENDOSCOPY;  Service: Gastroenterology;  Laterality: N/A;   COLONOSCOPY WITH PROPOFOL N/A 01/22/2017   Procedure: COLONOSCOPY WITH PROPOFOL;  Surgeon: Christena Deem, MD;  Location: [facility] ENDOSCOPY;  Service: Endoscopy;  Laterality: N/A;   COLONOSCOPY WITH PROPOFOL N/A 01/28/2019   Procedure: COLONOSCOPY WITH PROPOFOL;  Surgeon: Toledo, Boykin Nearing, MD;  Location: [facility] ENDOSCOPY;  Service: Gastroenterology;  Laterality: N/A;   ESOPHAGOGASTRODUODENOSCOPY     ESOPHAGOGASTRODUODENOSCOPY (EGD) WITH PROPOFOL N/A 01/28/2019   Procedure: ESOPHAGOGASTRODUODENOSCOPY (EGD) WITH PROPOFOL;  Surgeon: Toledo, Boykin Nearing, MD;  Location: [facility] ENDOSCOPY;  Service: Gastroenterology;  Laterality: N/A;   EYE SURGERY     cataract extraction with IOL  bilaterally   JOINT REPLACEMENT     LOWER EXTREMITY ANGIOGRAPHY Right 08/24/2022   Procedure: Lower Extremity Angiography;  Surgeon: Annice Needy, MD;  Location: [facility] INVASIVE CV LAB;  Service: Cardiovascular;  Laterality: Right;   LUMBAR LAMINECTOMY/DECOMPRESSION MICRODISCECTOMY  05/24/2011   Procedure: LUMBAR LAMINECTOMY/DECOMPRESSION MICRODISCECTOMY 2 LEVELS;  Surgeon: Drucilla Schmidt, MD;  Location: [facility] ORS;  Service: Orthopedics;  Laterality: N/A;  L2-L3, L3-L4 (x-ray)   RIGHT OOPHORECTOMY     due to STAPH INFECTION     Social History   Tobacco Use   Smoking  status: Former    Current packs/day: 0.00    Average packs/day: 0.5 packs/day for 50.0 years (25.0 ttl pk-yrs)    Types: Cigarettes    Start date: 05/16/1954    Quit date: 05/16/2004    Years since quitting: 18.5   Smokeless tobacco: Never  Vaping Use   Vaping status: Never Used  Substance Use Topics   Alcohol use: Yes    Comment: socially-1 glass of wine or [place] icea tea once a month   Drug use: No       Family History  Problem Relation Age of Onset   Dementia Mother    Lung cancer Father    Heart disease Father    Hypertension Sister    Breast cancer Maternal Aunt    Arthritis Son      Allergies  Allergen Reactions   Oxycodone Other (See Comments)    Mental status change   Prednisone     swelling, bruising.     REVIEW OF SYSTEMS (Negative unless checked)  Constitutional: [] Weight loss  [] Fever  [] Chills Cardiac: [] Chest pain   [] Chest pressure   [] Palpitations   [] Shortness of breath when laying flat   [] Shortness of breath at rest   [] Shortness of breath with exertion. Vascular:  [] Pain in legs with walking   [] Pain in legs at rest   [] Pain in legs when laying flat   [] Claudication   [] Pain in feet when walking  [] Pain in feet at rest  [] Pain in feet when laying flat   [] History of DVT   [] Phlebitis   [x] Swelling in legs   [x] Varicose veins   [x] Non-healing ulcers Pulmonary:   [] Uses home  oxygen   [] Productive cough   [] Hemoptysis   [] Wheeze  [x] COPD   [] Asthma Neurologic:  [] Dizziness  [] Blackouts   [] Seizures   [] History of stroke   [] History of TIA  [] Aphasia   [] Temporary blindness   [] Dysphagia   [] Weakness or numbness in arms   [] Weakness or numbness in legs Musculoskeletal:  [x] Arthritis   [] Joint swelling   [x] Joint pain   [x] Low back pain Hematologic:  [] Easy bruising  [] Easy bleeding   [] Hypercoagulable state   [] Anemic   Gastrointestinal:  [] Blood in stool   [] Vomiting blood  [] Gastroesophageal reflux/heartburn   [] Abdominal pain Genitourinary:  [] Chronic kidney disease   [] Difficult urination  [] Frequent urination  [] Burning with urination   [] Hematuria Skin:  [] Rashes   [] Ulcers   [] Wounds Psychological:  [] History of anxiety   []  History of major depression.  Physical Examination  BP 134/74 (BP Location: Left Arm)   Pulse 75   Resp 18   Ht 5\' 4"  (1.626 m)   Wt 185 lb (83.9 kg)   BMI 31.76 kg/m  Gen:  WD/WN, NAD. Appears younger than stated age. Head: [place]/AT, No temporalis wasting. Ear/Nose/Throat: Hearing grossly intact, nares w/o erythema or drainage Eyes: Conjunctiva clear. Sclera non-icteric Neck: Supple.  Trachea midline Pulmonary:  Good air movement, no use of accessory muscles.  Cardiac: RRR, no JVD Vascular:  Vessel Right Left  Radial Palpable Palpable                          PT 1+ Palpable Not Palpable  DP 2+ Palpable 2+ Palpable   Gastrointestinal: soft, non-tender/non-distended. No guarding/reflex.  Musculoskeletal: M/S 5/5 throughout.  No deformity or atrophy.  1+ right lower extremity edema, 1-2+ left lower extremity edema. Neurologic: Sensation grossly intact in extremities.  Symmetrical.  Speech is fluent.  Psychiatric: Judgment intact, Mood & affect appropriate for pt's clinical situation. Dermatologic: No rashes or ulcers noted.  No cellulitis or open wounds.      Labs Recent Results (from the past 2160 hour(s))  CBC with  Differential/Platelet     Status: Abnormal   Collection Time: 10/05/22  3:51 PM  Result Value Ref Range   WBC 6.1 3.4 - 10.8 x10E3/uL   RBC 4.47 3.77 - 5.28 x10E6/uL   Hemoglobin 10.5 (L) 11.1 - 15.9 g/dL   Hematocrit 82.9 56.2 - 46.6 %   MCV 80 79 - 97 fL   MCH 23.5 (L) 26.6 - 33.0 pg   MCHC 29.5 (L) 31.5 - 35.7 g/dL   RDW 13.0 (H) 86.5 - 78.4 %   Platelets 350 150 - 450 x10E3/uL   Neutrophils 56 Not Estab. %   Lymphs 30 Not Estab. %   Monocytes 9 Not Estab. %   Eos 4 Not Estab. %   Basos 1 Not Estab. %   Neutrophils Absolute 3.4 1.4 - 7.0 x10E3/uL   Lymphocytes Absolute 1.8 0.7 - 3.1 x10E3/uL   Monocytes Absolute 0.5 0.1 - 0.9 x10E3/uL   EOS (ABSOLUTE) 0.3 0.0 - 0.4 x10E3/uL   Basophils Absolute 0.1 0.0 - 0.2 x10E3/uL   Immature Granulocytes 0 Not Estab. %   Immature Grans (Abs) 0.0 0.0 - 0.1 x10E3/uL  Comprehensive Metabolic Panel (CMET)     Status: Abnormal   Collection Time: 10/05/22  3:51 PM  Result Value Ref Range   Glucose 141 (H) 70 - 99 mg/dL   BUN 25 8 - 27 mg/dL   Creatinine, Ser 6.96 (H) 0.57 - 1.00 mg/dL   eGFR 29 (L) >29 BM/WUX/3.24   BUN/Creatinine Ratio 15 12 - 28   Sodium 142 134 - 144 mmol/L   Potassium 5.0 3.5 - 5.2 mmol/L   Chloride 102 96 - 106 mmol/L   CO2 26 20 - 29 mmol/L   Calcium 10.2 8.7 - 10.3 mg/dL   Total Protein 6.7 6.0 - 8.5 g/dL   Albumin 4.3 3.7 - 4.7 g/dL   Globulin, Total 2.4 1.5 - 4.5 g/dL   Bilirubin Total <4.0 0.0 - 1.2 mg/dL   Alkaline Phosphatase 119 44 - 121 IU/L   AST 16 0 - 40 IU/L   ALT 9 0 - 32 IU/L  Basic metabolic panel     Status: Abnormal   Collection Time: 10/26/22 10:25 AM  Result Value Ref Range   Glucose 183 (H) 70 - 99 mg/dL   BUN 32 (H) 8 - 27 mg/dL   Creatinine, Ser 1.02 (H) 0.57 - 1.00 mg/dL   eGFR 39 (L) >72 ZD/GUY/4.03   BUN/Creatinine Ratio 24 12 - 28   Sodium 141 134 - 144 mmol/L   Potassium 4.9 3.5 - 5.2 mmol/L   Chloride 102 96 - 106 mmol/L   CO2 23 20 - 29 mmol/L   Calcium 9.3 8.7 - 10.3 mg/dL     Radiology No results found.  Assessment/Plan  Essential hypertension blood pressure control important in reducing the progression of atherosclerotic disease. On appropriate oral medications.   Diabetes mellitus type 2, uncomplicated (HCC) blood glucose control important in reducing the progression of atherosclerotic disease. Also, involved in wound healing. On appropriate medications.   HLD (hyperlipidemia) lipid control important in reducing the progression of atherosclerotic disease. Continue statin therapy   Lymphedema Patient still having persistent swelling more on the left leg on the right.  A new  prescription for compression garments were given today.  A venous reflux study will be done at her convenience as 1 has not been done in a couple of years.  We will see her back following the study.  She will continue to work on exercise and elevation as well.  Atherosclerosis of native arteries of the extremities with ulceration (HCC) Intervention was performed for iliac disease about 4 months ago.  Wounds have now healed.  Should get ABIs in the near future at her convenience for follow-up evaluation.  Continue aspirin, Plavix, and Lipitor.    Festus Barren, MD  12/11/2022 3:18 PM    This note was created with Dragon medical transcription system.  Any errors from dictation are purely unintentional

## 2022-12-11 NOTE — Assessment & Plan Note (Signed)
Patient still having persistent swelling more on the left leg on the right.  A new prescription for compression garments were given today.  A venous reflux study will be done at her convenience as 1 has not been done in a couple of years.  We will see her back following the study.  She will continue to work on exercise and elevation as well.

## 2022-12-11 NOTE — Assessment & Plan Note (Signed)
lipid control important in reducing the progression of atherosclerotic disease. Continue statin therapy

## 2022-12-11 NOTE — Assessment & Plan Note (Signed)
Intervention was performed for iliac disease about 4 months ago.  Wounds have now healed.  Should get ABIs in the near future at her convenience for follow-up evaluation.  Continue aspirin, Plavix, and Lipitor.

## 2022-12-29 NOTE — Progress Notes (Unsigned)
Cardiology Office Note  Date:  12/31/2022   ID:  Kathryn [name], DOB April 14, 1933, MRN [id number]  PCP:  Kathryn Ramp, MD   Chief Complaint  Patient presents with   Follow-up    F/u hosp. C/o fatigue. Meds reviewed verbally with pt.    HPI:  Kathryn [name] is a 89 yo woman with  long smoking history for 50 years , stopped 6-7 years ago,  COPD back surgery x2,  chronic back pain with pain radiating down her left leg, possible hip arthritis, chronic shortness of breath  Pulmonary HTN/flash pulm HTN EF 40 to 45% January 2022  hyperlipidemia  Frequent hospitalizations for COPD/CHF who presents for f/u of her shortness of breath, hyperlipidemia, diastolic CHF  LOV with myself 6/21 Followed by CHF clinic Seen by EP April 2024  In the hospital June 2024 for hypotension secondary to hypovolemia, sepsis Treated with IV fluids and antibiotics Started on broad spectrum antibiotics for known osteomyelitis of the right second toe  08/25/22 angiography performed with 90% stenosis of bilateral common iliac arteries, PCI performed with stent to bilateral common iliac arteries Plavix was added. On 6/2 right second toe amputation performed by podiatry   In follow-up today she presents with family Reports chronic shortness of breath on exertion No regular activity, no exercise  Drinks a lot of water Remains on torsemide 40 daily Chronic trace foot/ankle swelling, more on left Does not wear compression hose  Lab work reviewed Creatinine 1.32 BUN 32   EKG personally reviewed by myself on todays visit EKG Interpretation Date/Time:  Monday December 31 2022 16:17:51 EDT Ventricular Rate:  77 PR Interval:  206 QRS Duration:  110 QT Interval:  424 QTC Calculation: 479 R Axis:   -18  Text Interpretation: Normal sinus rhythm Low voltage QRS Cannot rule out Anteroseptal infarct , age undetermined Marked ST abnormality, possible lateral subendocardial injury When compared with ECG of  22-Aug-2022 17:57, No significant change was found Confirmed by Julien Nordmann [phone]) on 12/31/2022 4:30:01 PM    Other past medical history reviewed Admission to the hospital sept to oct 2022 ER, chest x-ray showed multifocal infiltrates, small pleural effusions.  "Pneumonia" started on diuretics, steroids, bronchodilators., Diuresed 5L  Taking torsemide 40 daily, extra torsemide did not help swelling Chronic ankle swelling  Does not eat out much Does not drive Family has to drive her everywhere  Other past medical hx hospitalized from 3/1 through 3/4 for COPD and CHF exacerbations. Again 06/08/2020   in the hospital 03/27/2020  Presented with leg edema and breathing difficulty. placed on BiPAP and IV Lasix started,  DuoNeb, steroids, and Nitropaste.  Echocardiogram : mildly reduced LVSF (EF 40 to 45%),  left bundle branch block of unknown duration.   Some question of whether there was atrial fibrillation seen on her monitor  seen in the heart failure clinic 04/04/2020.   moderate shortness of breath with exertion.    Zio monitor    PCP 04/05/2020.    improvement in her breathing per documentation.   04/12/2020,   unable to sleep over the weekend / overnight.   feeling poorly and that symptoms similar to that before her admission   racing heart rate on Sunday night, 04/10/20.  atrial fib  Lasix up to 40 for 3 days then back to 20 daily Started on crestor 5 daily  Stress test ordered that showed no significant ischemia Ejection fraction 30 to 44% CT attenuation correction images with aortic calcification, coronary calcification proximal LAD region (  Echocardiogram March 28, 2020 estimated ejection fraction 40 to 45%)   PMH:   has a past medical history of Actinic keratosis, Aortic atherosclerosis (HCC) (05/25/2020), Arthritis, Cardiomyopathy - presumed to be nonischemic, CHF (congestive heart failure) (HCC), COPD (chronic obstructive pulmonary disease) (HCC), Coronary artery  calcification seen on CT scan, Diabetes mellitus without complication (HCC), Gastric ulcer (06/2010), Hyperlipidemia, Hypertension, Hypothyroidism, Left bundle branch block (LBBB), Neuromuscular disorder (HCC), Peripheral vascular disease (HCC), Pneumonia, PSVT (paroxysmal supraventricular tachycardia) (HCC), Seasonal allergies, Shortness of breath, Skin cancer, Squamous cell carcinoma of skin (03/23/2013), Squamous cell carcinoma of skin (02/04/2014), Squamous cell carcinoma of skin (09/24/2018), Squamous cell carcinoma of skin (09/08/2019), Squamous cell carcinoma of skin (09/08/2019), and Squamous cell carcinoma of skin (06/20/2021).  PSH:    Past Surgical History:  Procedure Laterality Date   AMPUTATION TOE Right 08/26/2022   Procedure: AMPUTATION 2ND  TOE RIGHT FOOT;  Surgeon: Felecia Shelling, DPM;  Location: [facility] ORS;  Service: Podiatry;  Laterality: Right;   BACK SURGERY     4/12  Dr Simonne Come- for lumbar stenosis   BREAST CYST ASPIRATION Left 1965   negative   BREAST SURGERY  1962   lumpectomy with biopsy   left   CARPAL TUNNEL RELEASE     right   COLONOSCOPY     COLONOSCOPY WITH PROPOFOL N/A 06/30/2015   Procedure: COLONOSCOPY WITH PROPOFOL;  Surgeon: Wallace Cullens, MD;  Location: [facility] ENDOSCOPY;  Service: Gastroenterology;  Laterality: N/A;   COLONOSCOPY WITH PROPOFOL N/A 01/22/2017   Procedure: COLONOSCOPY WITH PROPOFOL;  Surgeon: Christena Deem, MD;  Location: [facility] ENDOSCOPY;  Service: Endoscopy;  Laterality: N/A;   COLONOSCOPY WITH PROPOFOL N/A 01/28/2019   Procedure: COLONOSCOPY WITH PROPOFOL;  Surgeon: Toledo, Boykin Nearing, MD;  Location: [facility] ENDOSCOPY;  Service: Gastroenterology;  Laterality: N/A;   ESOPHAGOGASTRODUODENOSCOPY     ESOPHAGOGASTRODUODENOSCOPY (EGD) WITH PROPOFOL N/A 01/28/2019   Procedure: ESOPHAGOGASTRODUODENOSCOPY (EGD) WITH PROPOFOL;  Surgeon: Toledo, Boykin Nearing, MD;  Location: [facility] ENDOSCOPY;  Service: Gastroenterology;  Laterality: N/A;   EYE SURGERY     cataract  extraction with IOL bilaterally   JOINT REPLACEMENT     LOWER EXTREMITY ANGIOGRAPHY Right 08/24/2022   Procedure: Lower Extremity Angiography;  Surgeon: Annice Needy, MD;  Location: [facility] INVASIVE CV LAB;  Service: Cardiovascular;  Laterality: Right;   LUMBAR LAMINECTOMY/DECOMPRESSION MICRODISCECTOMY  05/24/2011   Procedure: LUMBAR LAMINECTOMY/DECOMPRESSION MICRODISCECTOMY 2 LEVELS;  Surgeon: Drucilla Schmidt, MD;  Location: [facility] ORS;  Service: Orthopedics;  Laterality: N/A;  L2-L3, L3-L4 (x-ray)   RIGHT OOPHORECTOMY     due to STAPH INFECTION    Current Outpatient Medications  Medication Sig Dispense Refill   acetaminophen (TYLENOL) 500 MG tablet Take 1,000 mg by mouth every 6 (six) hours as needed. Pain      albuterol (PROVENTIL) (2.5 MG/3ML) 0.083% nebulizer solution USE 1 VIAL VIA NEBULIZER EVERY 6 HOURS AS NEEDED FOR WHEEZING OR SHORTNESS OF BREATH 150 mL 5   amiodarone (PACERONE) 200 MG tablet Take 0.5 tablets (100 mg total) by mouth daily. 45 tablet 3   aspirin 81 MG EC tablet Take 81 mg by mouth daily. Swallow whole.     atorvastatin (LIPITOR) 40 MG tablet TAKE 1 TABLET(40 MG) BY MOUTH DAILY 90 tablet 0   carvedilol (COREG) 6.25 MG tablet TAKE 1 TABLET(6.25 MG) BY MOUTH TWICE DAILY WITH A MEAL 180 tablet 3   clopidogrel (PLAVIX) 75 MG tablet Take 1 tablet (75 mg total) by mouth daily. 30 tablet 1  dapagliflozin propanediol (FARXIGA) 10 MG TABS tablet TAKE 1 TABLET(10 MG) BY MOUTH DAILY 90 tablet 3   gabapentin (NEURONTIN) 400 MG capsule Take 1 capsule (400 mg total) by mouth 4 (four) times daily. 120 capsule 2   glucose blood (CONTOUR NEXT TEST) test strip Check sugar once daily  DX E11.9 100 each 3   hydrOXYzine (ATARAX) 10 MG tablet TAKE 1 TO 2 TABLETS(10 TO 20 MG) BY MOUTH EVERY 6 HOURS AS NEEDED 180 tablet 0   levothyroxine (SYNTHROID) 100 MCG tablet TAKE 1 TABLET(100 MCG) BY MOUTH DAILY BEFORE BREAKFAST 90 tablet 1   metFORMIN (GLUCOPHAGE) 1000 MG tablet TAKE 1 TABLET(1000 MG) BY  MOUTH DAILY WITH SUPPER 90 tablet 0   SPIRIVA HANDIHALER 18 MCG inhalation capsule PLACE 1 CAPSULE INTO INHALER AND INHALE EVERY DAY AS NEEDED 30 capsule 12   torsemide (DEMADEX) 20 MG tablet Take 40 mg by mouth daily. May take an extra 20 mg after lunch as needed for swelling.     No current facility-administered medications for this visit.   Allergies:   Oxycodone and Prednisone   Social History:  The patient  reports that she quit smoking about 18 years ago. Her smoking use included cigarettes. She started smoking about 68 years ago. She has a 25 pack-year smoking history. She has never used smokeless tobacco. She reports current alcohol use. She reports that she does not use drugs.   Family History:   family history includes Arthritis in her son; Breast cancer in her maternal aunt; Dementia in her mother; Heart disease in her father; Hypertension in her sister; Lung cancer in her father.    Review of Systems: Review of Systems  Constitutional: Negative.   HENT: Negative.    Respiratory: Negative.    Cardiovascular: Negative.   Gastrointestinal: Negative.   Musculoskeletal: Negative.   Neurological: Negative.   Psychiatric/Behavioral: Negative.    All other systems reviewed and are negative.   PHYSICAL EXAM: VS:  BP 112/60 (BP Location: Left Arm, Patient Position: Sitting, Cuff Size: Normal)   Pulse 77   Ht 5\' 4"  (1.626 m)   Wt 193 lb 6 oz (87.7 kg)   SpO2 96%   BMI 33.19 kg/m  , BMI Body mass index is 33.19 kg/m. Constitutional:  oriented to person, place, and time. No distress.  HENT:  Head: Grossly normal Eyes:  no discharge. No scleral icterus.  Neck: No JVD, no carotid bruits  Cardiovascular: Regular rate and rhythm, no murmurs appreciated Pulmonary/Chest: Clear to auscultation bilaterally, no wheezes or rails Abdominal: Soft.  no distension.  no tenderness.  Musculoskeletal: Normal range of motion Neurological:  normal muscle tone. Coordination normal. No  atrophy Skin: Skin warm and dry Psychiatric: normal affect, pleasant  Recent Labs: 03/23/2022: Magnesium 2.3 08/22/2022: B Natriuretic Peptide 123.7; TSH 0.174 10/05/2022: ALT 9; Hemoglobin 10.5; Platelets 350 10/26/2022: BUN 32; Creatinine, Ser 1.32; Potassium 4.9; Sodium 141    Lipid Panel Lab Results  Component Value Date   CHOL 139 12/12/2021   HDL 66 12/12/2021   LDLCALC 41 12/12/2021   TRIG 204 (H) 12/12/2021     Wt Readings from Last 3 Encounters:  12/31/22 193 lb 6 oz (87.7 kg)  12/11/22 185 lb (83.9 kg)  10/05/22 185 lb (83.9 kg)     ASSESSMENT AND PLAN:  Problem List Items Addressed This Visit       Cardiology Problems   Essential hypertension   Relevant Orders   EKG 12-Lead (Completed)   HFrEF (heart  failure with reduced ejection fraction) (HCC)   Relevant Orders   EKG 12-Lead (Completed)   PSVT (paroxysmal supraventricular tachycardia) (HCC) - Primary   Relevant Orders   EKG 12-Lead (Completed)     Other   Diabetes mellitus type 2, uncomplicated (HCC)   Relevant Orders   EKG 12-Lead (Completed)   Lymphedema   Relevant Orders   EKG 12-Lead (Completed)   Other Visit Diagnoses     Chronic systolic heart failure (HCC)       Relevant Orders   EKG 12-Lead (Completed)   Centrilobular emphysema (HCC)       Relevant Orders   EKG 12-Lead (Completed)   Hyperlipidemia LDL goal <70       Relevant Orders   EKG 12-Lead (Completed)      COPD/emphysema On torsemide 40 daily,  Recommend she borrow pulse ox from family and monitor ambulatory saturations May need home oxygen for desaturation Chronic ankle swelling  Cardiomyopathy Ejection fraction 40 to 45% in January 2022 Continue Coreg, Farxiga, losartan Torsemide 40 daily, extra torsemide 20  milligrams in the p.m. for abdominal fullness or leg swelling Chronic ankle swelling likely component of venous insufficiency, leg elevation and compression hose recommended  Atrial tachycardia Continue Coreg  twice daily, amiodarone Denies any tachypalpitations  Hyperlipidemia Continue Lipitor  Coronary disease with stable angina Currently with no symptoms of angina. No further workup at this time. Continue current medication regimen.  PSVT On amiodarone, coreg, reports she is asymptomatic Previously noted early 2022  Neuropathy Burning of her feet at nighttime, neuropathic type pain Reports she takes gabapentin on a regular basis   Total encounter time more than 40 minutes  Greater than 50% was spent in counseling and coordination of care with the patient   Signed, Dossie Arbour, M.D., Ph.D. [facility] [place], [place] [phone]

## 2022-12-31 ENCOUNTER — Ambulatory Visit: Payer: Medicare HMO | Attending: Physician Assistant | Admitting: Cardiovascular Disease

## 2022-12-31 ENCOUNTER — Encounter: Payer: Self-pay | Admitting: Cardiovascular Disease

## 2022-12-31 VITALS — BP 112/60 | HR 77 | Ht 64.0 in | Wt 193.4 lb

## 2022-12-31 DIAGNOSIS — I471 Supraventricular tachycardia, unspecified: Secondary | ICD-10-CM | POA: Diagnosis not present

## 2022-12-31 DIAGNOSIS — J432 Centrilobular emphysema: Secondary | ICD-10-CM

## 2022-12-31 DIAGNOSIS — E119 Type 2 diabetes mellitus without complications: Secondary | ICD-10-CM | POA: Diagnosis not present

## 2022-12-31 DIAGNOSIS — I5022 Chronic systolic (congestive) heart failure: Secondary | ICD-10-CM | POA: Diagnosis not present

## 2022-12-31 DIAGNOSIS — I10 Essential (primary) hypertension: Secondary | ICD-10-CM

## 2022-12-31 DIAGNOSIS — E785 Hyperlipidemia, unspecified: Secondary | ICD-10-CM

## 2022-12-31 DIAGNOSIS — I5020 Unspecified systolic (congestive) heart failure: Secondary | ICD-10-CM

## 2022-12-31 DIAGNOSIS — I890 Lymphedema, not elsewhere classified: Secondary | ICD-10-CM

## 2022-12-31 MED ORDER — TORSEMIDE 20 MG PO TABS: 40.0000 mg | ORAL_TABLET | Freq: Every day | ORAL | 3 refills | Status: DC

## 2022-12-31 MED ORDER — ATORVASTATIN CALCIUM 40 MG PO TABS: ORAL_TABLET | ORAL | 3 refills | Status: DC

## 2022-12-31 NOTE — Patient Instructions (Signed)

## 2023-01-01 ENCOUNTER — Other Ambulatory Visit: Payer: Self-pay | Admitting: Family Medicine

## 2023-01-15 ENCOUNTER — Other Ambulatory Visit: Payer: Self-pay | Admitting: Family Medicine

## 2023-01-15 DIAGNOSIS — E039 Hypothyroidism, unspecified: Secondary | ICD-10-CM

## 2023-02-08 ENCOUNTER — Encounter (INDEPENDENT_AMBULATORY_CARE_PROVIDER_SITE_OTHER): Payer: Medicare HMO

## 2023-02-08 ENCOUNTER — Ambulatory Visit (INDEPENDENT_AMBULATORY_CARE_PROVIDER_SITE_OTHER): Payer: Medicare HMO | Admitting: Vascular Surgery

## 2023-02-26 ENCOUNTER — Other Ambulatory Visit: Payer: Self-pay | Admitting: Cardiology

## 2023-05-01 ENCOUNTER — Ambulatory Visit (INDEPENDENT_AMBULATORY_CARE_PROVIDER_SITE_OTHER): Payer: Medicare HMO | Admitting: Family Medicine

## 2023-05-01 ENCOUNTER — Encounter: Payer: Self-pay | Admitting: Family Medicine

## 2023-05-01 VITALS — BP 112/51 | HR 77 | Resp 20 | Ht 64.0 in | Wt 198.0 lb

## 2023-05-01 DIAGNOSIS — Z79899 Other long term (current) drug therapy: Secondary | ICD-10-CM

## 2023-05-01 DIAGNOSIS — J449 Chronic obstructive pulmonary disease, unspecified: Secondary | ICD-10-CM

## 2023-05-01 DIAGNOSIS — E1142 Type 2 diabetes mellitus with diabetic polyneuropathy: Secondary | ICD-10-CM

## 2023-05-01 DIAGNOSIS — I890 Lymphedema, not elsewhere classified: Secondary | ICD-10-CM

## 2023-05-01 DIAGNOSIS — I10 Essential (primary) hypertension: Secondary | ICD-10-CM

## 2023-05-01 DIAGNOSIS — E039 Hypothyroidism, unspecified: Secondary | ICD-10-CM

## 2023-05-01 DIAGNOSIS — E782 Mixed hyperlipidemia: Secondary | ICD-10-CM

## 2023-05-01 DIAGNOSIS — I5020 Unspecified systolic (congestive) heart failure: Secondary | ICD-10-CM

## 2023-05-01 DIAGNOSIS — I700 Atherosclerosis of aorta: Secondary | ICD-10-CM

## 2023-05-01 DIAGNOSIS — E875 Hyperkalemia: Secondary | ICD-10-CM

## 2023-05-01 DIAGNOSIS — I480 Paroxysmal atrial fibrillation: Secondary | ICD-10-CM

## 2023-05-01 DIAGNOSIS — F17200 Nicotine dependence, unspecified, uncomplicated: Secondary | ICD-10-CM

## 2023-05-01 MED ORDER — DULOXETINE HCL 30 MG PO CPEP: 30.0000 mg | ORAL_CAPSULE | Freq: Every day | ORAL | 3 refills | Status: DC

## 2023-05-01 NOTE — Assessment & Plan Note (Signed)
 Chronic condition with foot neuropathy. Currently on metformin  1000 mg daily and gabapentin  400 mg QID. Reports difficulty walking and frequent falls. Emphasized regular blood sugar monitoring and medication adherence. chronic - Order hemoglobin A1c - Continue metformin  1000 mg daily - Continue gabapentin  400 mg QID

## 2023-05-01 NOTE — Assessment & Plan Note (Signed)
 Chronic condition managed by cardiology. Currently on amiodarone  100 mg daily. No recent episodes reported. Emphasized regular cardiology follow-ups for treatment monitoring and adjustment. Chronic - Continue amiodarone  100 mg daily - Refer to cardiology for follow-up

## 2023-05-01 NOTE — Assessment & Plan Note (Signed)
 Chronic condition managed with atorvastatin  40 mg daily. Emphasized medication adherence to manage cholesterol levels. - Continue atorvastatin  40 mg daily

## 2023-05-01 NOTE — Progress Notes (Signed)
 Established patient visit   Patient: Kathryn [name]   DOB: 1933/09/15   89 y.o. Female  MRN: [id number] Visit Date: 05/01/2023  Today's healthcare provider: Rockie Agent, MD   Chief Complaint  Patient presents with   Medical Management of Chronic Issues   Subjective       Discussed the use of AI scribe software for clinical note transcription with the patient, who gave verbal consent to proceed.  History of Present Illness   Kathryn [name] is an 89 year old female with heart failure, reduced ejection fraction, paroxysmal atrial fibrillation, and central hypertension who presents with unsteadiness and weakness in her legs. She is accompanied by her son, Kathryn [name]  She experiences unsteadiness and weakness in her legs, progressively worsening over the past five years. The weakness is intermittent, affecting her ability to complete household tasks, and she is cautious to avoid falls. She has had minor falls in the past, the last occurring three to four months ago. She uses a walker or furniture for mobility at home.  She has a history of heart failure with reduced ejection fraction, paroxysmal atrial fibrillation, and central hypertension. She is managed by cardiology and takes amiodarone  100 mg, carvedilol  6.25 mg twice daily, Farxiga  10 mg, Lasix  20 mg, and torsemide  40 mg with an extra 20 mg as needed for swelling. She experiences shortness of breath with activity, which resolves after resting. No fevers, chills, or signs of infection are present.  She has chronic ankle swelling, which has worsened over the past two weeks. Despite taking an extra dose of torsemide , the swelling persists. She does not wear compression socks and admits to dietary indiscretions, particularly with salty foods, contributing to her swelling.  She has type 2 diabetes with neuropathy, managed with metformin  1000 mcg daily and gabapentin  400 mg four times a day. Neuropathy in her feet is  controlled with gabapentin , and she notices a significant difference if a dose is missed.  She has had shingles since July of the previous year, with persistent lesions on her neck that itch but do not spread. Gabapentin  is used to manage symptoms, and hydroxyzine  is used for itching.  She is a tobacco smoker and has COPD, for which she uses Spiriva  18 mcg daily and an albuterol  nebulizer as needed. She experiences shortness of breath, particularly with exertion.         Past Medical History:  Diagnosis Date   Actinic keratosis    Aortic atherosclerosis (HCC) 05/25/2020   Arthritis    spinal stenosis   Cardiomyopathy - presumed to be nonischemic    a. 03/2020 Echo: EF 40-45%, gr2 DD. Nl RV fxn. Mildly dil LA. Mild MR/ao sclerosis; b. 03/2020 MV: EF 30-44%, no ischemia. Cor Ca2+ in pLAD.   CHF (congestive heart failure) (HCC)    COPD (chronic obstructive pulmonary disease) (HCC)    Coronary artery calcification seen on CT scan    a. 03/2020 MV: CT attenuation images show Ao and mod pLAD Ca2+.   Diabetes mellitus without complication (HCC)    Gastric ulcer 06/2010   treated with Prolisec- states no problems now   Hyperlipidemia    Hypertension    PCP Dr Charlie Forte   [place]   Hypothyroidism    Left bundle branch block (LBBB)    Neuromuscular disorder (HCC)    slight numbness right toes- comes and goes   Peripheral vascular disease (HCC)    varicose veins left leg   Personal  history of fall 10/05/2022   Pneumonia    PSVT (paroxysmal supraventricular tachycardia) (HCC)    a. 03/2020 Zio: Avg HR 87 (66-211).  4 beats NSVT.  43 PSVT episodes, longest 10h 75m @ avg of 153 bpm (max 211).  Seen by EP-->amio started (pt wished to avoid EPS/RFCA).   Seasonal allergies    Shortness of breath    Skin cancer    multiple from face   Squamous cell carcinoma of skin 03/23/2013   L dorsal hand   Squamous cell carcinoma of skin 02/04/2014   R forearm/in situ, L pretibial   Squamous  cell carcinoma of skin 09/24/2018   R thumb   Squamous cell carcinoma of skin 09/08/2019   L forearm - ED&C   Squamous cell carcinoma of skin 09/08/2019   R dosal hand - ED&C   Squamous cell carcinoma of skin 06/20/2021   R cheek preauricular, excised   Vertebrobasilar circulation transient ischemic attack 09/17/2014    Medications: Outpatient Medications Prior to Visit  Medication Sig   acetaminophen  (TYLENOL ) 500 MG tablet Take 1,000 mg by mouth every 6 (six) hours as needed. Pain    albuterol  (PROVENTIL ) (2.5 MG/3ML) 0.083% nebulizer solution USE 1 VIAL VIA NEBULIZER EVERY 6 HOURS AS NEEDED FOR WHEEZING OR SHORTNESS OF BREATH   amiodarone  (PACERONE ) 200 MG tablet TAKE 1/2 TABLET(100 MG) BY MOUTH DAILY   aspirin  81 MG EC tablet Take 81 mg by mouth daily. Swallow whole.   atorvastatin  (LIPITOR) 40 MG tablet TAKE 1 TABLET(40 MG) BY MOUTH DAILY   carvedilol  (COREG ) 6.25 MG tablet TAKE 1 TABLET(6.25 MG) BY MOUTH TWICE DAILY WITH A MEAL   dapagliflozin  propanediol (FARXIGA ) 10 MG TABS tablet TAKE 1 TABLET(10 MG) BY MOUTH DAILY   furosemide  (LASIX ) 20 MG tablet Take 40 mg by mouth daily.   gabapentin  (NEURONTIN ) 400 MG capsule TAKE 1 CAPSULE(400 MG) BY MOUTH FOUR TIMES DAILY   glucose blood (CONTOUR NEXT TEST) test strip Check sugar once daily  DX E11.9   hydrOXYzine  (ATARAX ) 10 MG tablet TAKE 1 TO 2 TABLETS(10 TO 20 MG) BY MOUTH EVERY 6 HOURS AS NEEDED   levothyroxine  (SYNTHROID ) 100 MCG tablet TAKE 1 TABLET(100 MCG) BY MOUTH DAILY BEFORE BREAKFAST   metFORMIN  (GLUCOPHAGE ) 1000 MG tablet TAKE 1 TABLET(1000 MG) BY MOUTH DAILY WITH SUPPER   SPIRIVA  HANDIHALER 18 MCG inhalation capsule PLACE 1 CAPSULE INTO INHALER AND INHALE EVERY DAY AS NEEDED   torsemide  (DEMADEX ) 20 MG tablet Take 2 tablets (40 mg total) by mouth daily. May take an extra 20 mg after lunch as needed for swelling.   clopidogrel  (PLAVIX ) 75 MG tablet Take 1 tablet (75 mg total) by mouth daily. (Patient not taking: Reported on  05/01/2023)   No facility-administered medications prior to visit.    Review of Systems  Last CBC Lab Results  Component Value Date   WBC 6.1 10/05/2022   HGB 10.5 (L) 10/05/2022   HCT 35.6 10/05/2022   MCV 80 10/05/2022   MCH 23.5 (L) 10/05/2022   RDW 18.6 (H) 10/05/2022   PLT 350 10/05/2022   Last metabolic panel Lab Results  Component Value Date   GLUCOSE 183 (H) 10/26/2022   NA 141 10/26/2022   K 4.9 10/26/2022   CL 102 10/26/2022   CO2 23 10/26/2022   BUN 32 (H) 10/26/2022   CREATININE 1.32 (H) 10/26/2022   EGFR 39 (L) 10/26/2022   CALCIUM  9.3 10/26/2022   PHOS 4.0 01/11/2021   PROT 6.7 10/05/2022  ALBUMIN 4.3 10/05/2022   LABGLOB 2.4 10/05/2022   AGRATIO 1.1 08/23/2022   BILITOT <0.2 10/05/2022   ALKPHOS 119 10/05/2022   AST 16 10/05/2022   ALT 9 10/05/2022   ANIONGAP 6 08/29/2022   Last hemoglobin A1c Lab Results  Component Value Date   HGBA1C 6.9 (H) 08/22/2022   Last thyroid  functions Lab Results  Component Value Date   TSH 0.174 (L) 08/22/2022   T4TOTAL 7.1 08/22/2022   Last vitamin D No results found for: 25OHVITD2, 25OHVITD3, VD25OH Last vitamin B12 and Folate Lab Results  Component Value Date   VITAMINB12 120 (L) 08/23/2022   FOLATE 13.2 08/23/2022        Objective    BP (!) 112/51   Pulse 77   Resp 20   Ht 5' 4 (1.626 m)   Wt 198 lb (89.8 kg)   SpO2 95%   BMI 33.99 kg/m  BP Readings from Last 3 Encounters:  05/01/23 (!) 112/51  12/31/22 112/60  12/11/22 134/74   Wt Readings from Last 3 Encounters:  05/01/23 198 lb (89.8 kg)  12/31/22 193 lb 6 oz (87.7 kg)  12/11/22 185 lb (83.9 kg)        Physical Exam  Physical Exam   VITALS: BP- 112/51, BP worsened to 110/48 on repeat. NECK: Thyroid  gland not enlarged upon palpation. CHEST: Lungs clear to auscultation, no wheezing, crackles absent. CARDIOVASCULAR: Systolic murmur present. EXTREMITIES: Ankles edematous, toes on one foot purple in color indicating poor  circulation. NEUROLOGICAL: Decreased sensation in feet, 2/6 sensation in left foot, 1/6 sensation in right foot.       No results found for any visits on 05/01/23.  Assessment & Plan     Problem List Items Addressed This Visit       Cardiovascular and Mediastinum   Paroxysmal atrial fibrillation (HCC)   Chronic condition managed by cardiology. Currently on amiodarone  100 mg daily. No recent episodes reported. Emphasized regular cardiology follow-ups for treatment monitoring and adjustment. Chronic - Continue amiodarone  100 mg daily - Refer to cardiology for follow-up      Relevant Medications   furosemide  (LASIX ) 20 MG tablet   Other Relevant Orders   CBC   HFrEF (heart failure with reduced ejection fraction) (HCC) - Primary   Chronic condition managed by cardiology. Reports unsteadiness and leg weakness. Office BP is 112/51, indicating hypotension. Symptoms of exertional dyspnea noted, consistent with heart failure. Discussed potential medication adjustments and the need for cardiology follow-up. Explained amiodarone 's impact on thyroid  function, necessitating regular monitoring. - Order CBC, CMP, BNP, TSH, T4, T3, and hemoglobin A1c - Refer to cardiology for medication adjustment and further evaluation - Advise against extra torsemide  due to low BP - Continue Farxiga  10 mg daily - Continue Lasix  40 mg daily - contacted patient's cardiology office, appt scheduled for 05/10/23 follow up       Relevant Medications   furosemide  (LASIX ) 20 MG tablet   Other Relevant Orders   CBC   CMP14+EGFR   TSH+T4F+T3Free   B Nat Peptide   Essential hypertension   Relevant Medications   furosemide  (LASIX ) 20 MG tablet   Aortic atherosclerosis (HCC)   Relevant Medications   furosemide  (LASIX ) 20 MG tablet     Respiratory   COPD, mild (HCC)   Chronic condition with exertional dyspnea. Currently using Spiriva  18 mcg daily and albuterol  nebulizer PRN. Emphasized medication adherence and  monitoring for exacerbations. - Continue Spiriva  18 mcg daily - Continue albuterol  nebulizer PRN  Endocrine   Type 2 diabetes mellitus with peripheral neuropathy (HCC)   Chronic condition with foot neuropathy. Currently on metformin  1000 mg daily and gabapentin  400 mg QID. Reports difficulty walking and frequent falls. Emphasized regular blood sugar monitoring and medication adherence. chronic - Order hemoglobin A1c - Continue metformin  1000 mg daily - Continue gabapentin  400 mg QID      Relevant Medications   DULoxetine  (CYMBALTA ) 30 MG capsule   Other Relevant Orders   Hemoglobin A1c   Hypothyroidism   Chronic condition managed with Synthroid  100 mcg daily. No recent symptoms reported. Emphasized regular thyroid  function tests for proper management. - Order TSH, T4, T3 - Continue Synthroid  100 mcg daily        Other   Smoker   On amiodarone  therapy   Relevant Orders   TSH+T4F+T3Free   Lymphedema   Reports chronic ankle swelling. Advised against extra torsemide  due to low BP. Emphasized wearing compression socks and reducing salt intake to manage symptoms. Chronic - Advise wearing compression socks - Advise reducing salt intake - Continue current diuretics      Relevant Orders   CMP14+EGFR   TSH+T4F+T3Free   B Nat Peptide   HLD (hyperlipidemia)   Chronic condition managed with atorvastatin  40 mg daily. Emphasized medication adherence to manage cholesterol levels. - Continue atorvastatin  40 mg daily      Relevant Medications   furosemide  (LASIX ) 20 MG tablet      Office BP is 112/51, indicating hypotension. Discussed potential medication adjustments due to low BP and its contribution to weakness and unsteadiness. - Monitor BP closely - Refer to cardiology for medication adjustment   Aortic Atherosclerosis Chronic condition managed by cardiology. Currently on atorvastatin  40 mg daily and aspirin  81 mg daily. Emphasized medication adherence to manage  cholesterol and reduce cardiovascular risk. - Continue atorvastatin  40 mg daily - Continue aspirin  81 mg daily   Shingles (Postherpetic Neuralgia) Reports persistent shingles lesions with itching. Currently on gabapentin  400 mg QID and hydroxyzine  10-20 mg Q6H PRN. Discussed increasing hydroxyzine  a nd adding duloxetine  for better symptom control. - Increase hydroxyzine  to 15 mg Q6H PRN - Consider adding duloxetine  30 mg daily, increasing to 60 mg after one week if needed  General Health Maintenance Up-to-date on flu and COVID-19 vaccinations. Emphasized regular follow-ups and screenings. - Ensure up-to-date vaccinations - Schedule regular follow-ups every three months   Total time spent on today's visit was , including both face-to-face time and reviewing cardiology notes, echo (labs and imaging),reviewing PFT results brought in by the patient, ordering labs, calling cardiology office, discussing further work-up, treatment options, answering patient's and her son's questions, and coordinating care.     Return in about 6 weeks (around 06/12/2023) for  leg/foot edema , HTN.         Rockie Agent, MD  [facility] [phone] (phone) [phone] (fax)  [facility]

## 2023-05-01 NOTE — Assessment & Plan Note (Signed)
 Chronic condition with exertional dyspnea. Currently using Spiriva  18 mcg daily and albuterol  nebulizer PRN. Emphasized medication adherence and monitoring for exacerbations. - Continue Spiriva  18 mcg daily - Continue albuterol  nebulizer PRN

## 2023-05-01 NOTE — Assessment & Plan Note (Signed)
 Chronic condition managed by cardiology. Reports unsteadiness and leg weakness. Office BP is 112/51, indicating hypotension. Symptoms of exertional dyspnea noted, consistent with heart failure. Discussed potential medication adjustments and the need for cardiology follow-up. Explained amiodarone 's impact on thyroid  function, necessitating regular monitoring. - Order CBC, CMP, BNP, TSH, T4, T3, and hemoglobin A1c - Refer to cardiology for medication adjustment and further evaluation - Advise against extra torsemide  due to low BP - Continue Farxiga  10 mg daily - Continue Lasix  40 mg daily - contacted patient's cardiology office, appt scheduled for 05/10/23 follow up

## 2023-05-01 NOTE — Assessment & Plan Note (Signed)
Chronic condition managed with Synthroid 100 mcg daily. No recent symptoms reported. Emphasized regular thyroid function tests for proper management. - Order TSH, T4, T3 - Continue Synthroid 100 mcg daily

## 2023-05-01 NOTE — Assessment & Plan Note (Signed)
 Reports chronic ankle swelling. Advised against extra torsemide  due to low BP. Emphasized wearing compression socks and reducing salt intake to manage symptoms. Chronic - Advise wearing compression socks - Advise reducing salt intake - Continue current diuretics

## 2023-05-02 LAB — CMP14+EGFR
ALT: 9 [IU]/L (ref 0–32)
AST: 16 [IU]/L (ref 0–40)
Albumin: 4.1 g/dL (ref 3.7–4.7)
Alkaline Phosphatase: 125 [IU]/L — ABNORMAL HIGH (ref 44–121)
BUN/Creatinine Ratio: 16 (ref 12–28)
BUN: 25 mg/dL (ref 8–27)
Bilirubin Total: <0.2 mg/dL (ref 0.0–1.2)
CO2: 23 mmol/L (ref 20–29)
Calcium: 9.2 mg/dL (ref 8.7–10.3)
Chloride: 104 mmol/L (ref 96–106)
Creatinine, Ser: 1.52 mg/dL — ABNORMAL HIGH (ref 0.57–1.00)
Globulin, Total: 2.4 g/dL (ref 1.5–4.5)
Glucose: 230 mg/dL — ABNORMAL HIGH (ref 70–99)
Potassium: 6.1 mmol/L — ABNORMAL HIGH (ref 3.5–5.2)
Sodium: 142 mmol/L (ref 134–144)
Total Protein: 6.5 g/dL (ref 6.0–8.5)
eGFR: 33 mL/min/{1.73_m2} — ABNORMAL LOW (ref >59)

## 2023-05-02 LAB — CBC
Hematocrit: 34 % (ref 34.0–46.6)
Hemoglobin: 10 g/dL — ABNORMAL LOW (ref 11.1–15.9)
MCH: 22.7 pg — ABNORMAL LOW (ref 26.6–33.0)
MCHC: 29.4 g/dL — ABNORMAL LOW (ref 31.5–35.7)
MCV: 77 fL — ABNORMAL LOW (ref 79–97)
Platelets: 344 10*3/uL (ref 150–450)
RBC: 4.41 x10E6/uL (ref 3.77–5.28)
RDW: 16.6 % — ABNORMAL HIGH (ref 11.7–15.4)
WBC: 7.6 10*3/uL (ref 3.4–10.8)

## 2023-05-02 LAB — HEMOGLOBIN A1C
Est. average glucose Bld gHb Est-mCnc: 166 mg/dL
Hgb A1c MFr Bld: 7.4 % — ABNORMAL HIGH (ref 4.8–5.6)

## 2023-05-02 LAB — TSH+T4F+T3FREE
Free T4: 1.29 ng/dL (ref 0.82–1.77)
T3, Free: 2 pg/mL (ref 2.0–4.4)
TSH: 2.85 u[IU]/mL (ref 0.450–4.500)

## 2023-05-02 LAB — BRAIN NATRIURETIC PEPTIDE: BNP: 91.4 pg/mL (ref 0.0–100.0)

## 2023-05-03 ENCOUNTER — Encounter: Payer: Self-pay | Admitting: Family Medicine

## 2023-05-03 ENCOUNTER — Other Ambulatory Visit: Payer: Self-pay | Admitting: Family Medicine

## 2023-05-03 DIAGNOSIS — E875 Hyperkalemia: Secondary | ICD-10-CM

## 2023-05-03 MED ORDER — LOKELMA 5 G PO PACK
10.0000 g | PACK | Freq: Every day | ORAL | 0 refills | Status: AC
Start: 2023-05-03 — End: 2023-05-08

## 2023-05-03 NOTE — Progress Notes (Signed)
 Unable to get STAT BMP today from current order  Will send lokelma  10mg  once daily for 5 days   Patient will need to have repeat labs in one week, BMP ordered   Please update patient and her son

## 2023-05-03 NOTE — Addendum Note (Signed)
 Addended by: [name], Alfredo Spong L on: 05/03/2023 08:09 AM   Modules accepted: Orders

## 2023-05-03 NOTE — Progress Notes (Signed)
 Patient aware and son. See other message under orders.

## 2023-05-04 LAB — BMP8+EGFR
BUN/Creatinine Ratio: 17 (ref 12–28)
BUN: 23 mg/dL (ref 8–27)
CO2: 23 mmol/L (ref 20–29)
Calcium: 9.5 mg/dL (ref 8.7–10.3)
Chloride: 103 mmol/L (ref 96–106)
Creatinine, Ser: 1.35 mg/dL — ABNORMAL HIGH (ref 0.57–1.00)
Glucose: 141 mg/dL — ABNORMAL HIGH (ref 70–99)
Potassium: 5 mmol/L (ref 3.5–5.2)
Sodium: 143 mmol/L (ref 134–144)
eGFR: 38 mL/min/{1.73_m2} — ABNORMAL LOW (ref >59)

## 2023-05-07 ENCOUNTER — Telehealth: Payer: Self-pay

## 2023-05-07 NOTE — Telephone Encounter (Unsigned)
Copied from CRM [id number]. Topic: General - Inquiry >> May 07, 2023  1:32 PM Clide Dales wrote: Patient is requesting a callback from the provider to discuss her medications. Please advise.

## 2023-05-08 ENCOUNTER — Other Ambulatory Visit: Payer: Self-pay | Admitting: Family Medicine

## 2023-05-09 NOTE — Telephone Encounter (Signed)
Requested Prescriptions  Pending Prescriptions Disp Refills   hydrOXYzine (ATARAX) 10 MG tablet [Pharmacy Med Name: HYDROXYZINE HCL 10MG  TABLETS] 180 tablet 0    Sig: TAKE 1 TO 2 TABLETS(10 TO 20 MG) BY MOUTH EVERY 6 HOURS AS NEEDED     Ear, Nose, and Throat:  Antihistamines 2 Failed - 05/09/2023  8:31 AM      Failed - Cr in normal range and within 360 days    Creatinine  Date Value Ref Range Status  07/09/2014 0.85 mg/dL Final    Comment:    0.98-1.19 NOTE: New Reference Range  06/01/14    Creatinine, Ser  Date Value Ref Range Status  05/03/2023 1.35 (H) 0.57 - 1.00 mg/dL Final         Passed - Valid encounter within last 12 months    Recent Outpatient Visits           7 months ago Osteomyelitis of toe of right foot [facility])   [place] [facility] Merita Norton T, FNP   8 months ago Hypotension due to hypovolemia   [facility] Jacky Kindle, FNP   1 year ago Hospital discharge follow-up   [place] [facility] [name], [place], MD   1 year ago Lower limb ulcer, ankle, right, with unspecified severity [facility])   [facility] Caro Laroche, DO   1 year ago Need for immunization against influenza   [facility] Maple Hudson., MD       Future Appointments             Tomorrow Dunn, Raymon Mutton, PA-C [facility] [place]   In 1 week [name], Tawanna Cooler, MD [facility]

## 2023-05-09 NOTE — Progress Notes (Deleted)
 Cardiology Office Note    Date:  05/09/2023   ID:  Kathryn [name], DOB 1934-02-23, MRN [id number]  PCP:  Ronnald Ramp, MD  Cardiologist:  Julien Nordmann, MD  Electrophysiologist:  Lanier Prude, MD   Chief Complaint: ***  History of Present Illness:   Kathryn [name] is a 89 y.o. female with history of ***  ***   Labs independently reviewed: 04/2023 - BUN 23, serum creatinine 1.35, potassium 5.0, A1c 7.4, BNP 91, TSH normal, albumin, AST/ALT normal, Hgb 10.0, PLT 344 02/2022 - magnesium 2.3 11/2021 - TC 131, TG 204, HDL 66, LDL 41  Past Medical History:  Diagnosis Date   Actinic keratosis    Aortic atherosclerosis (HCC) 05/25/2020   Arthritis    spinal stenosis   Cardiomyopathy - presumed to be nonischemic    a. 03/2020 Echo: EF 40-45%, gr2 DD. Nl RV fxn. Mildly dil LA. Mild MR/ao sclerosis; b. 03/2020 MV: EF 30-44%, no ischemia. Cor Ca2+ in pLAD.   CHF (congestive heart failure) (HCC)    COPD (chronic obstructive pulmonary disease) (HCC)    Coronary artery calcification seen on CT scan    a. 03/2020 MV: CT attenuation images show Ao and mod pLAD Ca2+.   Diabetes mellitus without complication (HCC)    Gastric ulcer 06/2010   treated with Prolisec- states no problems now   Hyperlipidemia    Hypertension    PCP Dr Julieanne Manson   [place]   Hypothyroidism    Left bundle branch block (LBBB)    Neuromuscular disorder (HCC)    slight numbness right toes- comes and goes   Peripheral vascular disease (HCC)    varicose veins left leg   Personal history of fall 10/05/2022   Pneumonia    PSVT (paroxysmal supraventricular tachycardia) (HCC)    a. 03/2020 Zio: Avg HR 87 (66-211).  4 beats NSVT.  43 PSVT episodes, longest 10h 7m @ avg of 153 bpm (max 211).  Seen by EP-->amio started (pt wished to avoid EPS/RFCA).   Seasonal allergies    Shortness of breath    Skin cancer    multiple from face   Squamous cell carcinoma of skin 03/23/2013   L dorsal hand    Squamous cell carcinoma of skin 02/04/2014   R forearm/in situ, L pretibial   Squamous cell carcinoma of skin 09/24/2018   R thumb   Squamous cell carcinoma of skin 09/08/2019   L forearm - ED&C   Squamous cell carcinoma of skin 09/08/2019   R dosal hand - ED&C   Squamous cell carcinoma of skin 06/20/2021   R cheek preauricular, excised   Vertebrobasilar circulation transient ischemic attack 09/17/2014    Past Surgical History:  Procedure Laterality Date   AMPUTATION TOE Right 08/26/2022   Procedure: AMPUTATION 2ND  TOE RIGHT FOOT;  Surgeon: Felecia Shelling, DPM;  Location: [facility] ORS;  Service: Podiatry;  Laterality: Right;   BACK SURGERY     4/12  Dr Simonne Come- for lumbar stenosis   BREAST CYST ASPIRATION Left 1965   negative   BREAST SURGERY  1962   lumpectomy with biopsy   left   CARPAL TUNNEL RELEASE     right   COLONOSCOPY     COLONOSCOPY WITH PROPOFOL N/A 06/30/2015   Procedure: COLONOSCOPY WITH PROPOFOL;  Surgeon: Wallace Cullens, MD;  Location: [facility] ENDOSCOPY;  Service: Gastroenterology;  Laterality: N/A;   COLONOSCOPY WITH PROPOFOL N/A 01/22/2017   Procedure: COLONOSCOPY WITH PROPOFOL;  Surgeon: Marva Panda,  Cindra Eves, MD;  Location: [facility] ENDOSCOPY;  Service: Endoscopy;  Laterality: N/A;   COLONOSCOPY WITH PROPOFOL N/A 01/28/2019   Procedure: COLONOSCOPY WITH PROPOFOL;  Surgeon: Toledo, Boykin Nearing, MD;  Location: [facility] ENDOSCOPY;  Service: Gastroenterology;  Laterality: N/A;   ESOPHAGOGASTRODUODENOSCOPY     ESOPHAGOGASTRODUODENOSCOPY (EGD) WITH PROPOFOL N/A 01/28/2019   Procedure: ESOPHAGOGASTRODUODENOSCOPY (EGD) WITH PROPOFOL;  Surgeon: Toledo, Boykin Nearing, MD;  Location: [facility] ENDOSCOPY;  Service: Gastroenterology;  Laterality: N/A;   EYE SURGERY     cataract extraction with IOL bilaterally   JOINT REPLACEMENT     LOWER EXTREMITY ANGIOGRAPHY Right 08/24/2022   Procedure: Lower Extremity Angiography;  Surgeon: Annice Needy, MD;  Location: [facility] INVASIVE CV LAB;  Service: Cardiovascular;   Laterality: Right;   LUMBAR LAMINECTOMY/DECOMPRESSION MICRODISCECTOMY  05/24/2011   Procedure: LUMBAR LAMINECTOMY/DECOMPRESSION MICRODISCECTOMY 2 LEVELS;  Surgeon: Drucilla Schmidt, MD;  Location: [facility];  Service: Orthopedics;  Laterality: N/A;  L2-L3, L3-L4 (x-ray)   RIGHT OOPHORECTOMY     due to STAPH INFECTION    Current Medications: No outpatient medications have been marked as taking for the 05/10/23 encounter (Appointment) with Sondra Barges, PA-C.    Allergies:   Oxycodone and Prednisone   Social History   Socioeconomic History   Marital status: Widowed    Spouse name: Not on file   Number of children: Not on file   Years of education: Not on file   Highest education level: Not on file  Occupational History   Not on file  Tobacco Use   Smoking status: Former    Current packs/day: 0.00    Average packs/day: 0.5 packs/day for 50.0 years (25.0 ttl pk-yrs)    Types: Cigarettes    Start date: 05/16/1954    Quit date: 05/16/2004    Years since quitting: 18.9   Smokeless tobacco: Never  Vaping Use   Vaping status: Never Used  Substance and Sexual Activity   Alcohol use: Yes    Comment: socially-1 glass of wine or [place] icea tea once a month   Drug use: No   Sexual activity: Yes    Birth control/protection: None  Other Topics Concern   Not on file  Social History Narrative   Not on file   Social Drivers of Health   Financial Resource Strain: Low Risk  (08/13/2022)   Overall Financial Resource Strain (CARDIA)    Difficulty of Paying Living Expenses: Not hard at all  Food Insecurity: No Food Insecurity (08/23/2022)   Hunger Vital Sign    Worried About Running Out of Food in the Last Year: Never true    Ran Out of Food in the Last Year: Never true  Transportation Needs: No Transportation Needs (08/23/2022)   PRAPARE - Administrator, Civil Service (Medical): No    Lack of Transportation (Non-Medical): No  Physical Activity: Inactive (08/13/2022)    Exercise Vital Sign    Days of Exercise per Week: 0 days    Minutes of Exercise per Session: 0 min  Stress: No Stress Concern Present (08/13/2022)   [employer] of Occupational Health - Occupational Stress Questionnaire    Feeling of Stress : Not at all  Social Connections: Moderately Isolated (08/13/2022)   Social Connection and Isolation Panel [NHANES]    Frequency of Communication with Friends and Family: More than three times a week    Frequency of Social Gatherings with Friends and Family: Three times a week    Attends Religious Services: More than  4 times per year    Active Member of Clubs or Organizations: No    Attends Banker Meetings: Never    Marital Status: Widowed     Family History:  The patient's family history includes Arthritis in her son; Breast cancer in her maternal aunt; Dementia in her mother; Heart disease in her father; Hypertension in her sister; Lung cancer in her father.  ROS:   12-point review of systems is negative unless otherwise noted in the HPI.   EKGs/Labs/Other Studies Reviewed:    Studies reviewed were summarized above. The additional studies were reviewed today:  2D echo 08/23/2022: 1. Left ventricular ejection fraction, by estimation, is 40 to 45%. The  left ventricle has mildly decreased function. Left ventricular endocardial  border not optimally defined to evaluate regional wall motion. Left  ventricular diastolic parameters are  consistent with Grade I diastolic dysfunction (impaired relaxation).   2. Right ventricular systolic function is normal. The right ventricular  size is normal. There is normal pulmonary artery systolic pressure.   3. The mitral valve was not well visualized. Mild mitral valve  regurgitation. No evidence of mitral stenosis.   4. The aortic valve was not well visualized. Aortic valve regurgitation  is not visualized. No aortic stenosis is present.   5. The inferior vena cava is normal in size with  <50% respiratory  variability, suggesting right atrial pressure of 8 mmHg.  __________  2D echo 05/24/2022: 1. Left ventricular ejection fraction, by estimation, is 35 to 40%. The  left ventricle has moderately decreased function. The left ventricle  demonstrates global hypokinesis. Left ventricular diastolic parameters are  consistent with Grade I diastolic  dysfunction (impaired relaxation).   2. Right ventricular systolic function is normal. The right ventricular  size is normal. There is normal pulmonary artery systolic pressure. The  estimated right ventricular systolic pressure is 14.2 mmHg.   3. The mitral valve is normal in structure. Mild mitral valve  regurgitation. No evidence of mitral stenosis.   4. The aortic valve is normal in structure. Aortic valve regurgitation is  not visualized. No aortic stenosis is present.   5. The inferior vena cava is normal in size with greater than 50%  respiratory variability, suggesting right atrial pressure of 3 mmHg.  __________  Luci Bank patch 03/2020: Normal sinus rhythm Patient had a min HR of 66 bpm, max HR of 211 bpm, and avg HR of 87 bpm.     1 run of Ventricular Tachycardia occurred lasting 4 beats with a max rate of 185 bpm (avg 168 bpm).    43 Supraventricular Tachycardia  runs occurred, the run with the fastest interval lasting 10 hours 35 mins with a max rate of 211 bpm (avg 153 bpm); the run with the fastest interval was also the longest.   Idioventricular Rhythm was present. Isolated SVEs were rare (<1.0%), SVE Couplets  were rare (<1.0%), and SVE Triplets were rare (<1.0%).  Isolated VEs were rare (<1.0%, 2912), VE Couplets were rare (<1.0%, 23), and VE Triplets were rare (<1.0%, 7).  Ventricular Bigeminy and Trigeminy were present.    Patient triggered events (shortness of breath, fluttering/racing)  were not associated with significant arrhythmia __________  Lexiscan MPI 04/15/2020: ST segment depression was noted during  stress in the aVF, II and III leads. EKG is not interpretable due to underlying left bundle branch block. This is an intermediate risk study due to reduced ejection fraction. The left ventricular ejection fraction is moderately decreased (30-44%).  No significant perfusion defects are noted. Possible nonischemic cardiomyopathy. CT attenuation images shows aortic calcifications and moderate coronary calcifications mostly in the proximal LAD distribution. ___________  2D echo 03/28/2020: 1. Left ventricular ejection fraction, by estimation, is 40 to 45%. The  left ventricle has mildly decreased function. Left ventricular endocardial  border not optimally defined to evaluate regional wall motion. Left  ventricular diastolic parameters are  consistent with Grade II diastolic dysfunction (pseudonormalization). The  global longitudinal strain is abnormal.   2. Right ventricular systolic function is normal. The right ventricular  size is normal. Tricuspid regurgitation signal is inadequate for assessing  PA pressure.   3. Left atrial size was mildly dilated.   4. The mitral valve is abnormal. Mild mitral valve regurgitation. No  evidence of mitral stenosis.   5. The aortic valve is normal in structure. Aortic valve regurgitation is  mild. Mild aortic valve sclerosis is present, with no evidence of aortic  valve stenosis.  __________  See CV studies in Epic for more remote imaging   EKG:  EKG is ordered today.  The EKG ordered today demonstrates ***  Recent Labs: 05/01/2023: ALT 9; BNP 91.4; Hemoglobin 10.0; Platelets 344; TSH 2.850 05/03/2023: BUN 23; Creatinine, Ser 1.35; Potassium 5.0; Sodium 143  Recent Lipid Panel    Component Value Date/Time   CHOL 139 12/12/2021 1116   TRIG 204 (H) 12/12/2021 1116   HDL 66 12/12/2021 1116   CHOLHDL 2.1 12/12/2021 1116   CHOLHDL 5.9 04/13/2020 0909   VLDL 48 (H) 04/13/2020 0909   LDLCALC 41 12/12/2021 1116    PHYSICAL EXAM:    VS:  There were no  vitals taken for this visit.  BMI: There is no height or weight on file to calculate BMI.  Physical Exam  Wt Readings from Last 3 Encounters:  05/01/23 198 lb (89.8 kg)  12/31/22 193 lb 6 oz (87.7 kg)  12/11/22 185 lb (83.9 kg)     ASSESSMENT & PLAN:   ***   {Are you ordering a CV Procedure (e.g. stress test, cath, DCCV, TEE, etc)?   Press F2        :[id number]}     Disposition: F/u with Dr. Mariah Milling or an APP in ***, and EP as directed.    Medication Adjustments/Labs and Tests Ordered: Current medicines are reviewed at length with the patient today.  Concerns regarding medicines are outlined above. Medication changes, Labs and Tests ordered today are summarized above and listed in the Patient Instructions accessible in Encounters.   Signed, Eula Listen, PA-C 05/09/2023 1:26 PM     [facility] [place] [address] [place], [place] [address] [phone]

## 2023-05-09 NOTE — Telephone Encounter (Signed)
Patient wants to know why she was prescribed Cymbalta and now she feels very irritated because it was not discuss with her and that she doesn't have depression. Please review.

## 2023-05-09 NOTE — Addendum Note (Signed)
Addended by: Marjie Skiff on: 05/09/2023 04:59 PM   Modules accepted: Orders

## 2023-05-09 NOTE — Telephone Encounter (Signed)
From chart review, looks like this has been on med list since 2022. If she has been taking this regularly, I would recommend short taper to discontinue which we can discuss at her next visit.  If she has not been taking it, she can stop the cymbalta

## 2023-05-10 ENCOUNTER — Ambulatory Visit: Payer: Medicare HMO | Admitting: Physician Assistant

## 2023-05-17 ENCOUNTER — Ambulatory Visit: Payer: Medicare HMO | Admitting: Family Medicine

## 2023-05-22 ENCOUNTER — Other Ambulatory Visit: Payer: Self-pay | Admitting: Family Medicine

## 2023-05-22 DIAGNOSIS — E119 Type 2 diabetes mellitus without complications: Secondary | ICD-10-CM

## 2023-05-22 NOTE — Telephone Encounter (Signed)
 Requested Prescriptions  Pending Prescriptions Disp Refills   metFORMIN (GLUCOPHAGE) 1000 MG tablet [Pharmacy Med Name: METFORMIN 1000MG  TABLETS] 30 tablet 0    Sig: TAKE 1 TABLET(1000 MG) BY MOUTH DAILY WITH SUPPER     Endocrinology:  Diabetes - Biguanides Failed - 05/22/2023  5:00 PM      Failed - Cr in normal range and within 360 days    Creatinine  Date Value Ref Range Status  07/09/2014 0.85 mg/dL Final    Comment:    6.21-3.08 NOTE: New Reference Range  06/01/14    Creatinine, Ser  Date Value Ref Range Status  05/03/2023 1.35 (H) 0.57 - 1.00 mg/dL Final         Failed - eGFR in normal range and within 360 days    EGFR (African American)  Date Value Ref Range Status  07/09/2014 >60  Final   GFR calc Af Amer  Date Value Ref Range Status  07/29/2019 84 >59 mL/min/1.73 Final    Comment:    **[facility] currently reports eGFR in compliance with the current**   recommendations of the [employer]. [facility] will   update reporting as new guidelines are published from the NKF-ASN   Task force.    EGFR (Non-African Amer.)  Date Value Ref Range Status  07/09/2014 >60  Final    Comment:    eGFR values <57mL/min/1.73 m2 may be an indication of chronic kidney disease (CKD). Calculated eGFR is useful in patients with stable renal function. The eGFR calculation will not be reliable in acutely ill patients when serum creatinine is changing rapidly. It is not useful in patients on dialysis. The eGFR calculation may not be applicable to patients at the low and high extremes of body sizes, pregnant women, and vegetarians.    GFR, Estimated  Date Value Ref Range Status  08/29/2022 >60 >60 mL/min Final    Comment:    (NOTE) Calculated using the CKD-EPI Creatinine Equation (2021)    eGFR  Date Value Ref Range Status  05/03/2023 38 (L) >59 mL/min/1.73 Final         Failed - B12 Level in normal range and within 720 days    Vitamin B-12  Date Value Ref Range  Status  08/23/2022 120 (L) 180 - 914 pg/mL Final    Comment:    (NOTE) This assay is not validated for testing neonatal or myeloproliferative syndrome specimens for Vitamin B12 levels. Performed at [facility], [address] [place], [place] [address]          Failed - Valid encounter within last 6 months    Recent Outpatient Visits           7 months ago Osteomyelitis of toe of right foot [facility])   [facility] Merita Norton T, FNP   9 months ago Hypotension due to hypovolemia   [facility] Jacky Kindle, FNP   1 year ago Hospital discharge follow-up   [place] [facility] [name], [place], MD   1 year ago Lower limb ulcer, ankle, right, with unspecified severity [facility])   [facility] Caro Laroche, DO   1 year ago Need for immunization against influenza   [facility] Maple Hudson., MD       Future Appointments             In 3 weeks Dunn, Raymon Mutton, PA-C [facility]  [place]            Passed - HBA1C is between 0 and 7.9 and within 180 days    Hgb A1c MFr Bld  Date Value Ref Range Status  05/01/2023 7.4 (H) 4.8 - 5.6 % Final    Comment:             Prediabetes: 5.7 - 6.4          Diabetes: >6.4          Glycemic control for adults with diabetes: <7.0          Passed - CBC within normal limits and completed in the last 12 months    WBC  Date Value Ref Range Status  05/01/2023 7.6 3.4 - 10.8 x10E3/uL Final  08/30/2022 7.1 4.0 - 10.5 K/uL Final   RBC  Date Value Ref Range Status  05/01/2023 4.41 3.77 - 5.28 x10E6/uL Final  08/30/2022 4.20 3.87 - 5.11 MIL/uL Final   Hemoglobin  Date Value Ref Range Status  05/01/2023 10.0 (L) 11.1 - 15.9 g/dL Final   Hematocrit  Date Value Ref Range Status  05/01/2023 34.0 34.0 - 46.6 % Final   MCHC  Date Value Ref Range Status  05/01/2023 29.4 (L) 31.5 -  35.7 g/dL Final  16/12/9602 54.0 (L) 30.0 - 36.0 g/dL Final   [facility]  Date Value Ref Range Status  05/01/2023 22.7 (L) 26.6 - 33.0 pg Final  08/30/2022 22.9 (L) 26.0 - 34.0 pg Final   MCV  Date Value Ref Range Status  05/01/2023 77 (L) 79 - 97 fL Final  06/23/2014 90 80 - 100 fL Final   No results found for: "PLTCOUNTKUC", "LABPLAT", "POCPLA" RDW  Date Value Ref Range Status  05/01/2023 16.6 (H) 11.7 - 15.4 % Final  06/23/2014 24.3 (H) 11.5 - 14.5 % Final

## 2023-05-25 ENCOUNTER — Other Ambulatory Visit: Payer: Self-pay | Admitting: Family Medicine

## 2023-05-25 DIAGNOSIS — E119 Type 2 diabetes mellitus without complications: Secondary | ICD-10-CM

## 2023-05-27 ENCOUNTER — Other Ambulatory Visit: Payer: Self-pay | Admitting: Cardiology

## 2023-05-27 NOTE — Telephone Encounter (Signed)
 Requested Prescriptions  Pending Prescriptions Disp Refills   metFORMIN (GLUCOPHAGE) 1000 MG tablet [Pharmacy Med Name: METFORMIN 1000MG  TABLETS] 90 tablet 1    Sig: TAKE 1 TABLET(1000 MG) BY MOUTH DAILY WITH SUPPER     Endocrinology:  Diabetes - Biguanides Failed - 05/27/2023  2:29 PM      Failed - Cr in normal range and within 360 days    Creatinine  Date Value Ref Range Status  07/09/2014 0.85 mg/dL Final    Comment:    1.61-0.96 NOTE: New Reference Range  06/01/14    Creatinine, Ser  Date Value Ref Range Status  05/03/2023 1.35 (H) 0.57 - 1.00 mg/dL Final         Failed - eGFR in normal range and within 360 days    EGFR (African American)  Date Value Ref Range Status  07/09/2014 >60  Final   GFR calc Af Amer  Date Value Ref Range Status  07/29/2019 84 >59 mL/min/1.73 Final    Comment:    **[facility] currently reports eGFR in compliance with the current**   recommendations of the [employer]. [facility] will   update reporting as new guidelines are published from the NKF-ASN   Task force.    EGFR (Non-African Amer.)  Date Value Ref Range Status  07/09/2014 >60  Final    Comment:    eGFR values <48mL/min/1.73 m2 may be an indication of chronic kidney disease (CKD). Calculated eGFR is useful in patients with stable renal function. The eGFR calculation will not be reliable in acutely ill patients when serum creatinine is changing rapidly. It is not useful in patients on dialysis. The eGFR calculation may not be applicable to patients at the low and high extremes of body sizes, pregnant women, and vegetarians.    GFR, Estimated  Date Value Ref Range Status  08/29/2022 >60 >60 mL/min Final    Comment:    (NOTE) Calculated using the CKD-EPI Creatinine Equation (2021)    eGFR  Date Value Ref Range Status  05/03/2023 38 (L) >59 mL/min/1.73 Final         Failed - B12 Level in normal range and within 720 days    Vitamin B-12  Date Value Ref Range  Status  08/23/2022 120 (L) 180 - 914 pg/mL Final    Comment:    (NOTE) This assay is not validated for testing neonatal or myeloproliferative syndrome specimens for Vitamin B12 levels. Performed at [facility], [address], [place], [place] [address]          Failed - Valid encounter within last 6 months    Recent Outpatient Visits           7 months ago Osteomyelitis of toe of right foot [facility])   [place] [facility] Merita Norton T, FNP   9 months ago Hypotension due to hypovolemia   [facility] Jacky Kindle, FNP   1 year ago Hospital discharge follow-up   [place] [facility] [name], [place], MD   1 year ago Lower limb ulcer, ankle, right, with unspecified severity [facility])   [facility] Caro Laroche, DO   1 year ago Need for immunization against influenza   [facility] Maple Hudson., MD       Future Appointments             In 2 weeks Dunn, Raymon Mutton, PA-C [facility]  [place]            Passed - HBA1C is between 0 and 7.9 and within 180 days    Hgb A1c MFr Bld  Date Value Ref Range Status  05/01/2023 7.4 (H) 4.8 - 5.6 % Final    Comment:             Prediabetes: 5.7 - 6.4          Diabetes: >6.4          Glycemic control for adults with diabetes: <7.0          Passed - CBC within normal limits and completed in the last 12 months    WBC  Date Value Ref Range Status  05/01/2023 7.6 3.4 - 10.8 x10E3/uL Final  08/30/2022 7.1 4.0 - 10.5 K/uL Final   RBC  Date Value Ref Range Status  05/01/2023 4.41 3.77 - 5.28 x10E6/uL Final  08/30/2022 4.20 3.87 - 5.11 MIL/uL Final   Hemoglobin  Date Value Ref Range Status  05/01/2023 10.0 (L) 11.1 - 15.9 g/dL Final   Hematocrit  Date Value Ref Range Status  05/01/2023 34.0 34.0 - 46.6 % Final   MCHC  Date Value Ref Range Status  05/01/2023 29.4 (L) 31.5 -  35.7 g/dL Final  82/95/6213 08.6 (L) 30.0 - 36.0 g/dL Final   [facility]  Date Value Ref Range Status  05/01/2023 22.7 (L) 26.6 - 33.0 pg Final  08/30/2022 22.9 (L) 26.0 - 34.0 pg Final   MCV  Date Value Ref Range Status  05/01/2023 77 (L) 79 - 97 fL Final  06/23/2014 90 80 - 100 fL Final   No results found for: "PLTCOUNTKUC", "LABPLAT", "POCPLA" RDW  Date Value Ref Range Status  05/01/2023 16.6 (H) 11.7 - 15.4 % Final  06/23/2014 24.3 (H) 11.5 - 14.5 % Final

## 2023-05-30 ENCOUNTER — Other Ambulatory Visit: Payer: Self-pay | Admitting: Family Medicine

## 2023-05-30 ENCOUNTER — Other Ambulatory Visit: Payer: Self-pay | Admitting: *Deleted

## 2023-05-30 MED ORDER — CARVEDILOL 6.25 MG PO TABS: ORAL_TABLET | ORAL | 3 refills | Status: DC

## 2023-05-30 MED ORDER — AMIODARONE HCL 200 MG PO TABS: 100.0000 mg | ORAL_TABLET | Freq: Every day | ORAL | 3 refills | Status: DC

## 2023-05-30 MED ORDER — DAPAGLIFLOZIN PROPANEDIOL 10 MG PO TABS: 10.0000 mg | ORAL_TABLET | Freq: Every day | ORAL | 3 refills | Status: DC

## 2023-05-31 ENCOUNTER — Other Ambulatory Visit: Payer: Self-pay | Admitting: Family Medicine

## 2023-06-03 NOTE — Telephone Encounter (Signed)
 Requested medication (s) are due for refill today: yes  Requested medication (s) are on the active medication list: yes  Last refill:  05/09/23 #180  Future visit scheduled: no  Notes to clinic:  Cr outside normal range   Requested Prescriptions  Pending Prescriptions Disp Refills   hydrOXYzine (ATARAX) 10 MG tablet [Pharmacy Med Name: HYDROXYZINE HCL 10MG  TABLETS] 180 tablet 0    Sig: TAKE 1 TO 2 TABLETS(10 TO 20 MG) BY MOUTH EVERY 6 HOURS AS NEEDED     Ear, Nose, and Throat:  Antihistamines 2 Failed - 06/03/2023  8:05 AM      Failed - Cr in normal range and within 360 days    Creatinine  Date Value Ref Range Status  07/09/2014 0.85 mg/dL Final    Comment:    4.09-8.11 NOTE: New Reference Range  06/01/14    Creatinine, Ser  Date Value Ref Range Status  05/03/2023 1.35 (H) 0.57 - 1.00 mg/dL Final         Passed - Valid encounter within last 12 months    Recent Outpatient Visits           8 months ago Osteomyelitis of toe of right foot [facility])   [facility] Merita Norton T, FNP   9 months ago Hypotension due to hypovolemia   [facility] Jacky Kindle, FNP   1 year ago Hospital discharge follow-up   [place] [facility] [name], [place], MD   1 year ago Lower limb ulcer, ankle, right, with unspecified severity [facility])   [facility] Caro Laroche, DO   1 year ago Need for immunization against influenza   [facility] Maple Hudson., MD       Future Appointments             In 1 week Dunn, Raymon Mutton, PA-C [facility]

## 2023-06-14 ENCOUNTER — Encounter: Payer: Self-pay | Admitting: Physician Assistant

## 2023-06-14 ENCOUNTER — Ambulatory Visit: Payer: Medicare HMO | Attending: Physician Assistant | Admitting: Physician Assistant

## 2023-06-14 VITALS — BP 120/52 | HR 70 | Ht 64.0 in | Wt 203.4 lb

## 2023-06-14 DIAGNOSIS — I5020 Unspecified systolic (congestive) heart failure: Secondary | ICD-10-CM | POA: Diagnosis not present

## 2023-06-14 DIAGNOSIS — I471 Supraventricular tachycardia, unspecified: Secondary | ICD-10-CM | POA: Diagnosis not present

## 2023-06-14 DIAGNOSIS — I10 Essential (primary) hypertension: Secondary | ICD-10-CM | POA: Diagnosis not present

## 2023-06-14 DIAGNOSIS — J449 Chronic obstructive pulmonary disease, unspecified: Secondary | ICD-10-CM | POA: Diagnosis not present

## 2023-06-14 DIAGNOSIS — I25119 Atherosclerotic heart disease of native coronary artery with unspecified angina pectoris: Secondary | ICD-10-CM

## 2023-06-14 DIAGNOSIS — E785 Hyperlipidemia, unspecified: Secondary | ICD-10-CM

## 2023-06-14 NOTE — Patient Instructions (Signed)
 Medication Instructions:  Your physician recommends that you continue on your current medications as directed. Please refer to the Current Medication list given to you today.   *If you need a refill on your cardiac medications before your next appointment, please call your pharmacy*  Testing/Procedures: Your physician has requested that you have an echocardiogram. Echocardiography is a painless test that uses sound waves to create images of your heart. It provides your doctor with information about the size and shape of your heart and how well your heart's chambers and valves are working. This procedure takes approximately one hour. There are no restrictions for this procedure. Please do NOT wear cologne, perfume, aftershave, or lotions (deodorant is allowed). Please arrive 15 minutes prior to your appointment time.  Please note: We ask at that you not bring children with you during ultrasound (echo/ vascular) testing. Due to room size and safety concerns, children are not allowed in the ultrasound rooms during exams. Our front office staff cannot provide observation of children in our lobby area while testing is being conducted. An adult accompanying a patient to their appointment will only be allowed in the ultrasound room at the discretion of the ultrasound technician under special circumstances. We apologize for any inconvenience.   ECHO BEFORE 3 MONTH FOLLOW UP   Follow-Up: At [facility], you and your health needs are our priority.  As part of our continuing mission to provide you with exceptional heart care, we have created designated Provider Care Teams.  These Care Teams include your primary Cardiologist (physician) and Advanced Practice Providers (APPs -  Physician Assistants and Nurse Practitioners) who all work together to provide you with the care you need, when you need it.  We recommend signing up for the patient portal called "MyChart".  Sign up information is provided on  this After Visit Summary.  MyChart is used to connect with patients for Virtual Visits (Telemedicine).  Patients are able to view lab/test results, encounter notes, upcoming appointments, etc.  Non-urgent messages can be sent to your provider as well.   To learn more about what you can do with MyChart, go to [email or web address].    Your next appointment:   3 month(s)  Provider:   Eula Listen, PA-C

## 2023-06-14 NOTE — Progress Notes (Signed)
 Cardiology Office Note    Date:  06/14/2023   ID:  Kathryn [name], DOB [birth year], MRN [id number]  PCP:  Kathryn Ramp, MD  Cardiologist:  Kathryn Nordmann, MD  Electrophysiologist:  Kathryn Prude, MD   Chief Complaint: Follow up  History of Present Illness:   Kathryn [name] is a 89 y.o. female with history of aortic atherosclerosis, arthritis, diastolic heart failure, COPD, coronary calcification seen on CT, diabetes mellitus, hyperlipidemia, hypertension, hypothyroidism, LBBB, peripheral vascular disease, PSVT, and vertebrobasilar circulation transient ischemic attack who presents for follow up of her diastolic heart failure, hypertension, and hyperlipidemia.   She underwent PMI 06/2012 that was low risk. She has a history of 2014 carotid study without evidence of hemodynamically significant carotid stenosis. Hospitalized 03/2020 for SOB and LE edema. She was placed on BiPAP and underwent IV diuresis with DuoNeb, steroids, and nitropaste. EF 40-45% with LBBB of unknown duration. Short runs of atrial fibrillation were noted on telemetry with overall low burden. He was successfully IV diuresed and switched to furosemide and oral with ARB and BB. Recommendation was for further ischemic workup as outpatient for reduced EF. She was discharged with cardiac monitor and OAC deferred. 03/2020 MPI overall normal with intermediate risk.  Statin increased and patient started on aspirin. 03/2020 ZIO with recommendations to increase Coreg and EP referral provided. Seen by EP 04/2020 for symptomatic PSVT.  Patient was not interested in ablation at that time. She was started on amiodarone. Admitted again 3/1 and 3/14 for heart failure/COPD exacerbation and improvement with IV diuresis. Admitted again 11/2020 for multifocal pneumonia.  Seen in office 12/2020 for follow-up with ongoing volume overload. Torsemide was increased for 4 days with close follow-up 3 days later. Continued to struggle with dyspnea  on exertion and volume overload.  At that time torsemide was increased to 40 mg daily. Seen in follow-up 01/2021 and overall was doing better. Additional torsemide 20 mg was added as needed for abdominal fullness or leg swelling. Seen again in follow-up 08/2021 and continued to experience lower extremity edema. Recommended TED hose. Was seen by EP 12/2021 and doing well on amiodarone without recurrence of SVT. Her dose was decreased to 100 mg daily.  Seen again by EP 06/2022 with low palpitation burden, stable labs, no medication changes were made. She was hospitalized 07/2022 for significant fatigue. Found to be hypotensive with BP 84/48. Hospitalization further complicated by osteomyelitis of  the second right toe which was later amputated. Her home losartan was stopped and she was given IV fluids with improvement in BP. She was most recently seen in the cardiology clinic 12/2022 for follow up, and at that time she reported chronic shortness of breath on exertion with chronic lower extremity swelling. Recommended checking oxygen saturation at home with consideration for home oxygen desaturation was present. No medication changes were made.   Today she is overall doing well from a cardiac perspective. She reports that she feels the best she has in months. She does endorse ongoing dyspnea on exertion which is a chronic issue for her. This is worse when she is in a rush to do something. No dyspnea at rest. She does not check her oxygen saturation at home. She uses a walker at home. She notes intermittent LE edema which she occasionally takes an extra torsemide for. She does not weigh herself daily or take BP at home. She denies chest pain, palpitations, lightheadedness, dizziness, bleeding, orthopnea, and PND.   Labs independently reviewed: 05/01/2023-Hgb 10, HCT  34, TSH and free T4 normal, BNP 91, A1c 7.4, CR 1, potassium 5  Past Medical History:  Diagnosis Date   Actinic keratosis    Aortic atherosclerosis  (HCC) 05/25/2020   Arthritis    spinal stenosis   Cardiomyopathy - presumed to be nonischemic    a. 03/2020 Echo: EF 40-45%, gr2 DD. Nl RV fxn. Mildly dil LA. Mild MR/ao sclerosis; b. 03/2020 MV: EF 30-44%, no ischemia. Cor Ca2+ in pLAD.   CHF (congestive heart failure) (HCC)    COPD (chronic obstructive pulmonary disease) (HCC)    Coronary artery calcification seen on CT scan    a. 03/2020 MV: CT attenuation images show Ao and mod pLAD Ca2+.   Diabetes mellitus without complication (HCC)    Gastric ulcer 06/2010   treated with Prolisec- states no problems now   Hyperlipidemia    Hypertension    PCP Dr Julieanne Manson   [place]   Hypothyroidism    Left bundle branch block (LBBB)    Neuromuscular disorder (HCC)    slight numbness right toes- comes and goes   Peripheral vascular disease (HCC)    varicose veins left leg   Personal history of fall 10/05/2022   Pneumonia    PSVT (paroxysmal supraventricular tachycardia) (HCC)    a. 03/2020 Zio: Avg HR 87 (66-211).  4 beats NSVT.  43 PSVT episodes, longest 10h 3m @ avg of 153 bpm (max 211).  Seen by EP-->amio started (pt wished to avoid EPS/RFCA).   Seasonal allergies    Shortness of breath    Skin cancer    multiple from face   Squamous cell carcinoma of skin 03/23/2013   L dorsal hand   Squamous cell carcinoma of skin 02/04/2014   R forearm/in situ, L pretibial   Squamous cell carcinoma of skin 09/24/2018   R thumb   Squamous cell carcinoma of skin 09/08/2019   L forearm - ED&C   Squamous cell carcinoma of skin 09/08/2019   R dosal hand - ED&C   Squamous cell carcinoma of skin 06/20/2021   R cheek preauricular, excised   Vertebrobasilar circulation transient ischemic attack 09/17/2014    Past Surgical History:  Procedure Laterality Date   AMPUTATION TOE Right 08/26/2022   Procedure: AMPUTATION 2ND  TOE RIGHT FOOT;  Surgeon: Felecia Shelling, DPM;  Location: [facility] ORS;  Service: Podiatry;  Laterality: Right;   BACK SURGERY      4/12  Dr Simonne Come- for lumbar stenosis   BREAST CYST ASPIRATION Left 1965   negative   BREAST SURGERY  1962   lumpectomy with biopsy   left   CARPAL TUNNEL RELEASE     right   COLONOSCOPY     COLONOSCOPY WITH PROPOFOL N/A 06/30/2015   Procedure: COLONOSCOPY WITH PROPOFOL;  Surgeon: Wallace Cullens, MD;  Location: [facility] ENDOSCOPY;  Service: Gastroenterology;  Laterality: N/A;   COLONOSCOPY WITH PROPOFOL N/A 01/22/2017   Procedure: COLONOSCOPY WITH PROPOFOL;  Surgeon: Christena Deem, MD;  Location: [facility] ENDOSCOPY;  Service: Endoscopy;  Laterality: N/A;   COLONOSCOPY WITH PROPOFOL N/A 01/28/2019   Procedure: COLONOSCOPY WITH PROPOFOL;  Surgeon: Toledo, Boykin Nearing, MD;  Location: [facility] ENDOSCOPY;  Service: Gastroenterology;  Laterality: N/A;   ESOPHAGOGASTRODUODENOSCOPY     ESOPHAGOGASTRODUODENOSCOPY (EGD) WITH PROPOFOL N/A 01/28/2019   Procedure: ESOPHAGOGASTRODUODENOSCOPY (EGD) WITH PROPOFOL;  Surgeon: Toledo, Boykin Nearing, MD;  Location: [facility] ENDOSCOPY;  Service: Gastroenterology;  Laterality: N/A;   EYE SURGERY     cataract extraction with IOL bilaterally  JOINT REPLACEMENT     LOWER EXTREMITY ANGIOGRAPHY Right 08/24/2022   Procedure: Lower Extremity Angiography;  Surgeon: Annice Needy, MD;  Location: [facility] INVASIVE CV LAB;  Service: Cardiovascular;  Laterality: Right;   LUMBAR LAMINECTOMY/DECOMPRESSION MICRODISCECTOMY  05/24/2011   Procedure: LUMBAR LAMINECTOMY/DECOMPRESSION MICRODISCECTOMY 2 LEVELS;  Surgeon: Drucilla Schmidt, MD;  Location: [facility] ORS;  Service: Orthopedics;  Laterality: N/A;  L2-L3, L3-L4 (x-ray)   RIGHT OOPHORECTOMY     due to STAPH INFECTION    Current Medications: Current Meds  Medication Sig   acetaminophen (TYLENOL) 500 MG tablet Take 1,000 mg by mouth every 6 (six) hours as needed. Pain    albuterol (PROVENTIL) (2.5 MG/3ML) 0.083% nebulizer solution USE 1 VIAL VIA NEBULIZER EVERY 6 HOURS AS NEEDED FOR WHEEZING OR SHORTNESS OF BREATH   amiodarone (PACERONE) 200 MG tablet  Take 0.5 tablets (100 mg total) by mouth daily.   aspirin 81 MG EC tablet Take 81 mg by mouth daily. Swallow whole.   atorvastatin (LIPITOR) 40 MG tablet TAKE 1 TABLET(40 MG) BY MOUTH DAILY   carvedilol (COREG) 6.25 MG tablet TAKE 1 TABLET(6.25 MG) BY MOUTH TWICE DAILY WITH A MEAL   clopidogrel (PLAVIX) 75 MG tablet Take 1 tablet (75 mg total) by mouth daily.   dapagliflozin propanediol (FARXIGA) 10 MG TABS tablet Take 1 tablet (10 mg total) by mouth daily.   gabapentin (NEURONTIN) 400 MG capsule TAKE 1 CAPSULE(400 MG) BY MOUTH FOUR TIMES DAILY   glucose blood (CONTOUR NEXT TEST) test strip Check sugar once daily  DX E11.9   hydrOXYzine (ATARAX) 10 MG tablet TAKE 1 TO 2 TABLETS(10 TO 20 MG) BY MOUTH EVERY 6 HOURS AS NEEDED   levothyroxine (SYNTHROID) 100 MCG tablet TAKE 1 TABLET(100 MCG) BY MOUTH DAILY BEFORE BREAKFAST   metFORMIN (GLUCOPHAGE) 1000 MG tablet TAKE 1 TABLET(1000 MG) BY MOUTH DAILY WITH SUPPER   SPIRIVA HANDIHALER 18 MCG inhalation capsule PLACE 1 CAPSULE INTO INHALER AND INHALE EVERY DAY AS NEEDED   torsemide (DEMADEX) 20 MG tablet Take 2 tablets (40 mg total) by mouth daily. May take an extra 20 mg after lunch as needed for swelling.   [DISCONTINUED] furosemide (LASIX) 20 MG tablet Take 40 mg by mouth daily.    Allergies:   Oxycodone and Prednisone   Social History   Socioeconomic History   Marital status: Widowed    Spouse name: Not on file   Number of children: Not on file   Years of education: Not on file   Highest education level: Not on file  Occupational History   Not on file  Tobacco Use   Smoking status: Former    Current packs/day: 0.00    Average packs/day: 0.5 packs/day for 50.0 years (25.0 ttl pk-yrs)    Types: Cigarettes    Start date: 05/16/1954    Quit date: 05/16/2004    Years since quitting: 19.0   Smokeless tobacco: Never  Vaping Use   Vaping status: Never Used  Substance and Sexual Activity   Alcohol use: Yes    Comment: socially-1 glass of  wine or [place] icea tea once a month   Drug use: No   Sexual activity: Yes    Birth control/protection: None  Other Topics Concern   Not on file  Social History Narrative   Not on file   Social Drivers of Health   Financial Resource Strain: Low Risk  (08/13/2022)   Overall Financial Resource Strain (CARDIA)    Difficulty of Paying Living Expenses:  Not hard at all  Food Insecurity: No Food Insecurity (08/23/2022)   Hunger Vital Sign    Worried About Running Out of Food in the Last Year: Never true    Ran Out of Food in the Last Year: Never true  Transportation Needs: No Transportation Needs (08/23/2022)   PRAPARE - Administrator, Civil Service (Medical): No    Lack of Transportation (Non-Medical): No  Physical Activity: Inactive (08/13/2022)   Exercise Vital Sign    Days of Exercise per Week: 0 days    Minutes of Exercise per Session: 0 min  Stress: No Stress Concern Present (08/13/2022)   [employer] of Occupational Health - Occupational Stress Questionnaire    Feeling of Stress : Not at all  Social Connections: Moderately Isolated (08/13/2022)   Social Connection and Isolation Panel [NHANES]    Frequency of Communication with Friends and Family: More than three times a week    Frequency of Social Gatherings with Friends and Family: Three times a week    Attends Religious Services: More than 4 times per year    Active Member of Clubs or Organizations: No    Attends Banker Meetings: Never    Marital Status: Widowed     Family History:  The patient's family history includes Arthritis in her son; Breast cancer in her maternal aunt; Dementia in her mother; Heart disease in her father; Hypertension in her sister; Lung cancer in her father.  ROS:   12-point review of systems is negative unless otherwise noted in the HPI.  EKGs/Labs/Other Studies Reviewed:    Studies reviewed were summarized above. The additional studies were reviewed  today: 08/23/2022 Echo complete 1. Left ventricular ejection fraction, by estimation, is 40 to 45%. The  left ventricle has mildly decreased function. Left ventricular endocardial  border not optimally defined to evaluate regional wall motion. Left  ventricular diastolic parameters are  consistent with Grade I diastolic dysfunction (impaired relaxation).   2. Right ventricular systolic function is normal. The right ventricular  size is normal. There is normal pulmonary artery systolic pressure.   3. The mitral valve was not well visualized. Mild mitral valve  regurgitation. No evidence of mitral stenosis.   4. The aortic valve was not well visualized. Aortic valve regurgitation  is not visualized. No aortic stenosis is present.   5. The inferior vena cava is normal in size with <50% respiratory  variability, suggesting right atrial pressure of 8 mmHg.   04/20/2020 Long term monitor Normal sinus rhythm Patient had a min HR of 66 bpm, max HR of 211 bpm, and avg HR of 87 bpm.   1 run of Ventricular Tachycardia occurred lasting 4 beats with a max rate of 185 bpm (avg 168 bpm).  43 Supraventricular Tachycardia  runs occurred, the run with the fastest interval lasting 10 hours 35 mins with a max rate of 211 bpm (avg 153 bpm); the run with the fastest interval was also the longest. Idioventricular Rhythm was present. Isolated SVEs were rare (<1.0%), SVE Couplets  were rare (<1.0%), and SVE Triplets were rare (<1.0%).  Isolated VEs were rare (<1.0%, 2912), VE Couplets were rare (<1.0%, 23), and VE Triplets were rare (<1.0%, 7).  Ventricular Bigeminy and Trigeminy were present.    Patient triggered events (shortness of breath, fluttering/racing)  were not associated with significant arrhythmia  EKG:  EKG is ordered today.  The EKG ordered today demonstrates normal sinus rhythm with LBBB and nonspecific ST segment/T wave  changes which are unchanged from prior.   Recent Labs: 05/01/2023: ALT 9; BNP  91.4; Hemoglobin 10.0; Platelets 344; TSH 2.850 05/03/2023: BUN 23; Creatinine, Ser 1.35; Potassium 5.0; Sodium 143  Recent Lipid Panel    Component Value Date/Time   CHOL 139 12/12/2021 1116   TRIG 204 (H) 12/12/2021 1116   HDL 66 12/12/2021 1116   CHOLHDL 2.1 12/12/2021 1116   CHOLHDL 5.9 04/13/2020 0909   VLDL 48 (H) 04/13/2020 0909   LDLCALC 41 12/12/2021 1116    PHYSICAL EXAM:    VS:  BP (!) 120/52 (BP Location: Left Arm, Patient Position: Sitting, Cuff Size: Normal)   Pulse 70   Ht 5\' 4"  (1.626 m)   Wt 203 lb 6 oz (92.3 kg)   SpO2 91%   BMI 34.91 kg/m   BMI: Body mass index is 34.91 kg/m.  Physical Exam Vitals and nursing note reviewed.  Constitutional:      Appearance: Normal appearance.  Neck:     Vascular: No carotid bruit.  Cardiovascular:     Rate and Rhythm: Normal rate and regular rhythm.     Pulses: Normal pulses.     Heart sounds: Normal heart sounds. No murmur heard. Pulmonary:     Effort: Pulmonary effort is normal.     Breath sounds: Normal breath sounds. No wheezing or rales.  Abdominal:     General: There is no distension.  Musculoskeletal:     Cervical back: Normal range of motion and neck supple.     Right lower leg: No edema.     Left lower leg: No edema.  Skin:    General: Skin is warm and dry.  Neurological:     General: No focal deficit present.     Mental Status: She is alert and oriented to person, place, and time.  Psychiatric:        Mood and Affect: Mood normal.        Behavior: Behavior normal.    Wt Readings from Last 3 Encounters:  06/14/23 203 lb 6 oz (92.3 kg)  05/01/23 198 lb (89.8 kg)  12/31/22 193 lb 6 oz (87.7 kg)    ASSESSMENT & PLAN:   Heart failure with reduced ejection fraction - Echo 07/2022 with EF 40-45%. Patient overall doing well and appears euvolemic on exam today. She reports ongoing dyspnea on exertion which is a chronic issue for her. Continue carvedilol 6.25 mg twice daily, Farxiga 10 mg daily, and  torsemide 40 mg daily with additional 20 mg dose as needed for weight gain or lower extremity swelling. Repeat echo ordered.   Hyperlipidemia - Most recent lipid panel 11/2021 with LDL 41. Continue atorvastatin 40 mg daily.   Coronary artery disease - Overall stable without symptoms of angina or cardiac decompensation. Continue primary prevention with BB, ASA, and statin. No further ischemic evaluation indicated at this time.   Atrial tachycardia PSVT - Asymptomatic at this time. Continue amiodarone 100 mg daily. Follow with EP for further surveillance.   COPD/emphysema - Suspect this is what is driving her dyspnea on exertion. Recommended checking O2 saturations when she is short of breath. Discussed referral to pulmonology. However, she is establishing with a new PCP in the coming weeks and would like to defer until she discusses with them.  Disposition: F/u with Dr. Tiwanda Threats Milling or an APP in 3 months.   Medication Adjustments/Labs and Tests Ordered: Current medicines are reviewed at length with the patient today.  Concerns regarding medicines are outlined above. Medication  changes, Labs and Tests ordered today are summarized above and listed in the Patient Instructions accessible in Encounters.   Velora Mediate, PA-C 06/14/2023 3:01 PM      [facility] [address] [place], [place] [address] [phone]

## 2023-06-21 ENCOUNTER — Other Ambulatory Visit: Payer: Self-pay | Admitting: Family Medicine

## 2023-06-21 DIAGNOSIS — E119 Type 2 diabetes mellitus without complications: Secondary | ICD-10-CM

## 2023-06-21 NOTE — Telephone Encounter (Signed)
 Copied from CRM [id number]. Topic: Clinical - Medication Refill >> Jun 21, 2023  9:42 AM Gildardo Pounds wrote: Most Recent Primary Care Visit:  Provider: Ronnald Ramp  Department: [facility]  Visit Type: OFFICE VISIT  Date: 05/01/2023  Medication: metFORMIN (GLUCOPHAGE) 1000 MG tablet gabapentin (NEURONTIN) 400 MG capsule  Has the patient contacted their pharmacy? Yes (Agent: If no, request that the patient contact the pharmacy for the refill. If patient does not wish to contact the pharmacy document the reason why and proceed with request.) (Agent: If yes, when and what did the pharmacy advise?)  Is this the correct pharmacy for this prescription? Yes If no, delete pharmacy and type the correct one.  This is the patient's preferred pharmacy:  [facility] [place] [place] - [address] [place] [address] Phone: [phone] Fax: [phone]  Has the prescription been filled recently? No  Is the patient out of the medication? No  Has the patient been seen for an appointment in the last year OR does the patient have an upcoming appointment? Yes  Can we respond through MyChart? No  Agent: Please be advised that Rx refills may take up to 3 business days. We ask that you follow-up with your pharmacy.

## 2023-06-24 MED ORDER — GABAPENTIN 400 MG PO CAPS: 400.0000 mg | ORAL_CAPSULE | Freq: Two times a day (BID) | ORAL | 2 refills | Status: AC

## 2023-06-24 MED ORDER — METFORMIN HCL 1000 MG PO TABS
ORAL_TABLET | ORAL | 1 refills | Status: AC
Start: 2023-06-24 — End: ?

## 2023-06-24 NOTE — Telephone Encounter (Signed)
 Mail order pharmacy. Requested Prescriptions  Pending Prescriptions Disp Refills   gabapentin (NEURONTIN) 400 MG capsule 120 capsule 2     Neurology: Anticonvulsants - gabapentin Failed - 06/24/2023 12:26 PM      Failed - Cr in normal range and within 360 days    Creatinine  Date Value Ref Range Status  07/09/2014 0.85 mg/dL Final    Comment:    8.65-7.84 NOTE: New Reference Range  06/01/14    Creatinine, Ser  Date Value Ref Range Status  05/03/2023 1.35 (H) 0.57 - 1.00 mg/dL Final         Passed - Completed PHQ-2 or PHQ-9 in the last 360 days      Passed - Valid encounter within last 12 months    Recent Outpatient Visits           1 month ago HFrEF (heart failure with reduced ejection fraction) (HCC)   [name], Tawanna Cooler, MD       Future Appointments             In 2 months Gollan, Tollie Pizza, MD [facility]             metFORMIN (GLUCOPHAGE) 1000 MG tablet 90 tablet 1    Sig: TAKE 1 TABLET(1000 MG) BY MOUTH DAILY WITH SUPPER     Endocrinology:  Diabetes - Biguanides Failed - 06/24/2023 12:26 PM      Failed - Cr in normal range and within 360 days    Creatinine  Date Value Ref Range Status  07/09/2014 0.85 mg/dL Final    Comment:    6.96-2.95 NOTE: New Reference Range  06/01/14    Creatinine, Ser  Date Value Ref Range Status  05/03/2023 1.35 (H) 0.57 - 1.00 mg/dL Final         Failed - eGFR in normal range and within 360 days    EGFR (African American)  Date Value Ref Range Status  07/09/2014 >60  Final   GFR calc Af Amer  Date Value Ref Range Status  07/29/2019 84 >59 mL/min/1.73 Final    Comment:    **[facility] currently reports eGFR in compliance with the current**   recommendations of the [employer]. [facility] will   update reporting as new guidelines are published from the NKF-ASN   Task force.    EGFR (Non-African Amer.)  Date Value Ref Range Status   07/09/2014 >60  Final    Comment:    eGFR values <10mL/min/1.73 m2 may be an indication of chronic kidney disease (CKD). Calculated eGFR is useful in patients with stable renal function. The eGFR calculation will not be reliable in acutely ill patients when serum creatinine is changing rapidly. It is not useful in patients on dialysis. The eGFR calculation may not be applicable to patients at the low and high extremes of body sizes, pregnant women, and vegetarians.    GFR, Estimated  Date Value Ref Range Status  08/29/2022 >60 >60 mL/min Final    Comment:    (NOTE) Calculated using the CKD-EPI Creatinine Equation (2021)    eGFR  Date Value Ref Range Status  05/03/2023 38 (L) >59 mL/min/1.73 Final         Failed - B12 Level in normal range and within 720 days    Vitamin B-12  Date Value Ref Range Status  08/23/2022 120 (L) 180 - 914 pg/mL Final    Comment:    (NOTE) This assay is not validated  for testing neonatal or myeloproliferative syndrome specimens for Vitamin B12 levels. Performed at [facility], [address] [place], [place] [address]          Passed - HBA1C is between 0 and 7.9 and within 180 days    Hgb A1c MFr Bld  Date Value Ref Range Status  05/01/2023 7.4 (H) 4.8 - 5.6 % Final    Comment:             Prediabetes: 5.7 - 6.4          Diabetes: >6.4          Glycemic control for adults with diabetes: <7.0          Passed - Valid encounter within last 6 months    Recent Outpatient Visits           1 month ago HFrEF (heart failure with reduced ejection fraction) (HCC)   [name], Tawanna Cooler, MD       Future Appointments             In 2 months Gollan, Tollie Pizza, MD [facility] - CBC within normal limits and completed in the last 12 months    WBC  Date Value Ref Range Status  05/01/2023 7.6 3.4 - 10.8 x10E3/uL Final  08/30/2022 7.1 4.0 - 10.5  K/uL Final   RBC  Date Value Ref Range Status  05/01/2023 4.41 3.77 - 5.28 x10E6/uL Final  08/30/2022 4.20 3.87 - 5.11 MIL/uL Final   Hemoglobin  Date Value Ref Range Status  05/01/2023 10.0 (L) 11.1 - 15.9 g/dL Final   Hematocrit  Date Value Ref Range Status  05/01/2023 34.0 34.0 - 46.6 % Final   MCHC  Date Value Ref Range Status  05/01/2023 29.4 (L) 31.5 - 35.7 g/dL Final  60/45/4098 11.9 (L) 30.0 - 36.0 g/dL Final   [facility]  Date Value Ref Range Status  05/01/2023 22.7 (L) 26.6 - 33.0 pg Final  08/30/2022 22.9 (L) 26.0 - 34.0 pg Final   MCV  Date Value Ref Range Status  05/01/2023 77 (L) 79 - 97 fL Final  06/23/2014 90 80 - 100 fL Final   No results found for: "PLTCOUNTKUC", "LABPLAT", "POCPLA" RDW  Date Value Ref Range Status  05/01/2023 16.6 (H) 11.7 - 15.4 % Final  06/23/2014 24.3 (H) 11.5 - 14.5 % Final

## 2023-06-25 ENCOUNTER — Telehealth: Payer: Self-pay

## 2023-06-25 NOTE — Telephone Encounter (Unsigned)
 Copied from CRM [id number]. Topic: General - Other >> Jun 24, 2023  3:45 PM Emylou G wrote: Reason for CRM: Sue Lush w/[employer] home delivery Need manufacturer approval.. change to amneal from [employer].Marland Kitchen Pls call her [phone] TDV#[id number]

## 2023-06-27 NOTE — Telephone Encounter (Signed)
 Spoke with pharmacist name Tan and gave provider's message.

## 2023-06-27 NOTE — Telephone Encounter (Signed)
 Ok to switch manufacturers for prescription

## 2023-06-28 ENCOUNTER — Ambulatory Visit: Payer: Medicare HMO | Admitting: Family Medicine

## 2023-07-09 ENCOUNTER — Ambulatory Visit: Attending: Physician Assistant

## 2023-07-09 DIAGNOSIS — I471 Supraventricular tachycardia, unspecified: Secondary | ICD-10-CM

## 2023-07-09 DIAGNOSIS — I5030 Unspecified diastolic (congestive) heart failure: Secondary | ICD-10-CM | POA: Diagnosis not present

## 2023-07-09 DIAGNOSIS — I080 Rheumatic disorders of both mitral and aortic valves: Secondary | ICD-10-CM

## 2023-07-10 LAB — ECHOCARDIOGRAM COMPLETE
AV Mean grad: 9 mmHg
AV Peak grad: 17.5 mmHg
Ao pk vel: 2.09 m/s
Area-P 1/2: 3.72 cm2

## 2023-08-02 ENCOUNTER — Ambulatory Visit: Attending: Cardiology | Admitting: Cardiology

## 2023-08-02 ENCOUNTER — Encounter: Payer: Self-pay | Admitting: Cardiology

## 2023-08-02 VITALS — BP 118/60 | HR 63 | Ht 64.0 in | Wt 198.2 lb

## 2023-08-02 DIAGNOSIS — J449 Chronic obstructive pulmonary disease, unspecified: Secondary | ICD-10-CM | POA: Diagnosis not present

## 2023-08-02 DIAGNOSIS — I471 Supraventricular tachycardia, unspecified: Secondary | ICD-10-CM

## 2023-08-02 DIAGNOSIS — Z79899 Other long term (current) drug therapy: Secondary | ICD-10-CM | POA: Diagnosis not present

## 2023-08-02 DIAGNOSIS — I5032 Chronic diastolic (congestive) heart failure: Secondary | ICD-10-CM

## 2023-08-02 NOTE — Progress Notes (Signed)
 Electrophysiology Clinic Note    Date:  08/02/2023  Patient ID:  Kathryn [name] 1933-10-10, MRN [id number] PCP:  Nikki Barters, MD  Cardiologist:  Belva Boyden, MD Electrophysiologist: Boyce Byes, MD   Discussed the use of AI scribe software for clinical note transcription with the patient, who gave verbal consent to proceed.   Patient Profile    Chief Complaint: SVT follow-up  History of Present Illness: Kathryn [name] is a 89 y.o. female with PMH notable for PSVT, HFrEF, HTN, T2DM, COPD, hypothyroid ; seen today for Boyce Byes, MD for routine electrophysiology followup.   I last saw her 06/2022 where she was doing well with very rare SVT episodes on amiodarone .  She saw PA Levert Ready 05/2023 where she was euvolemic.   On follow-up today, she feels better than she has in months. She has not had any recent palpitation or fast heart rate episodes that she is aware of. She has noticed slightly increased DOE, but recovers quickly with rest. Her PCP believes her symptoms are related to her COPD. She is not interested in being referred to pulm.   She does have increased lower extremity edema currently, but usually does not. She noticed increased edema yesterday and considered taking her extra torsemide , but did not. She denies cough, has good appetite, able to sleep flat at night.  She denies chest pain, chest pressure, presyncope.     Arrhythmia/Device History Amiodarone      ROS:  Please see the history of present illness. All other systems are reviewed and otherwise negative.    Physical Exam    VS:  BP 118/60 (BP Location: Left Arm, Patient Position: Sitting, Cuff Size: Normal)   Pulse 63   Ht 5\' 4"  (1.626 m)   Wt 198 lb 3.2 oz (89.9 kg)   SpO2 95%   BMI 34.02 kg/m  BMI: Body mass index is 34.02 kg/m.  Wt Readings from Last 3 Encounters:  08/02/23 198 lb 3.2 oz (89.9 kg)  06/14/23 203 lb 6 oz (92.3 kg)  05/01/23 198 lb (89.8 kg)     GEN-  The patient is well appearing, alert and oriented x 3 today.   Lungs- Clear to ausculation bilaterally, diminished throughout, normal work of breathing.  Heart- Regular rate and rhythm, no murmurs, rubs or gallops Extremities- 1+ peripheral edema, warm, dry    Studies Reviewed   Previous EP, cardiology notes.    EKG is not ordered. Personal review of EKG from 06/14/2023 shows:  SR at 70, LBBB   EKG Interpretation Date/Time:  Friday Aug 02 2023 13:01:40 EDT Ventricular Rate:  63 PR Interval:  212 QRS Duration:  126 QT Interval:  434 QTC Calculation: 444 R Axis:   -6  Text Interpretation: Sinus rhythm with 1st degree A-V block Left bundle branch block Confirmed by Novalie Leamy [phone]) on 08/02/2023 1:05:37 PM    TTE, 07/10/2023  1. Left ventricular ejection fraction, by estimation, is 50 to 55%. The left ventricle has low normal function. The left ventricle demonstrates regional wall motion abnormalities (septal wall hypokinesis possibly secondary to conduction abnormality). Left ventricular diastolic parameters are consistent with Grade I  diastolic dysfunction (impaired relaxation). The average left ventricular global longitudinal strain is -18.3 %.   2. Right ventricular systolic function is normal. The right ventricular size is normal.   3. The mitral valve is normal in structure. Mild mitral valve regurgitation. No evidence of mitral stenosis.   4. The aortic  valve is normal in structure. Aortic valve regurgitation is mild. No aortic stenosis is present.   5. The inferior vena cava is normal in size with greater than 50% respiratory variability, suggesting right atrial pressure of 3 mmHg.   TTE, 05/24/2022  1. Left ventricular ejection fraction, by estimation, is 35 to 40%. The left ventricle has moderately decreased function. The left ventricle demonstrates global hypokinesis. Left ventricular diastolic parameters are consistent with Grade I diastolic dysfunction (impaired  relaxation).   2. Right ventricular systolic function is normal. The right ventricular size is normal. There is normal pulmonary artery systolic pressure. The estimated right ventricular systolic pressure is 14.2 mmHg.   3. The mitral valve is normal in structure. Mild mitral valve regurgitation. No evidence of mitral stenosis.   4. The aortic valve is normal in structure. Aortic valve regurgitation is not visualized. No aortic stenosis is present.   5. The inferior vena cava is normal in size with greater than 50% respiratory variability, suggesting right atrial pressure of 3 mmHg.    Long Term Monitor, 04/21/2020 Normal sinus rhythm Patient had a min HR of 66 bpm, max HR of 211 bpm, and avg HR of 87 bpm.    1 run of Ventricular Tachycardia occurred lasting 4 beats with a max rate of 185 bpm (avg 168 bpm).   43 Supraventricular Tachycardia  runs occurred, the run with the fastest interval lasting 10 hours 35 mins with a max rate of 211 bpm (avg 153 bpm); the run with the fastest interval was also the longest.  Idioventricular Rhythm was present. Isolated SVEs were rare (<1.0%), SVE Couplets  were rare (<1.0%), and SVE Triplets were rare (<1.0%).  Isolated VEs were rare (<1.0%, 2912), VE Couplets were rare (<1.0%, 23), and VE Triplets were rare (<1.0%, 7).  Ventricular Bigeminy and Trigeminy were present.   Patient triggered events (shortness of breath, fluttering/racing)  were not associated with significant arrhythmia       Assessment and Plan     #) parox SVT #) amiodarone  monitoring No recent SVT episodes Continue 100mg  amiodarone  daily  Continue 6.25mg  coreg  BID Update amio labs   #) HFimpEF #) lower extremity swelling NYHA III symptoms Unclear how much symptoms are being driven by CHF vs COPD She is having increased lower extremity edema, though without PND or cough, and weight stable Recommend she take per PRN 20mg  torsemide  today She is not interested in pulm eval for  COPD, and prefers to follow-up with PCP only Continue BB as above, 10mg  farxiga , 40mg  torsemide  daily  Historically hyperK prevents addition of spiro        Current medicines are reviewed at length with the patient today.   The patient does not have concerns regarding her medicines.  The following changes were made today:  none  Labs/ tests ordered today include:  Orders Placed This Encounter  Procedures   Comprehensive metabolic panel with GFR   TSH   T4, free   EKG 12-Lead     Disposition: Follow up with Dr. Marven Slimmer or EP APP in 6 months   Signed, Gracynn Rajewski, NP  08/02/23  1:34 PM  Electrophysiology [facility]

## 2023-08-02 NOTE — Patient Instructions (Signed)
 Medication Instructions:  Take your extra torsemide  today.   *If you need a refill on your cardiac medications before your next appointment, please call your pharmacy*  Lab Work: Your provider would like for you to have following labs drawn today CMET, TSH, T4 .   If you have labs (blood work) drawn today and your tests are completely normal, you will receive your results only by: MyChart Message (if you have MyChart) OR A paper copy in the mail If you have any lab test that is abnormal or we need to change your treatment, we will call you to review the results.  Follow-Up: At [facility], you and your health needs are our priority.  As part of our continuing mission to provide you with exceptional heart care, our providers are all part of one team.  This team includes your primary Cardiologist (physician) and Advanced Practice Providers or APPs (Physician Assistants and Nurse Practitioners) who all work together to provide you with the care you need, when you need it.  Your next appointment:   6 month(s)  Provider:   Harvie Liner, MD or Suzann Riddle, NP    We recommend signing up for the patient portal called "MyChart".  Sign up information is provided on this After Visit Summary.  MyChart is used to connect with patients for Virtual Visits (Telemedicine).  Patients are able to view lab/test results, encounter notes, upcoming appointments, etc.  Non-urgent messages can be sent to your provider as well.   To learn more about what you can do with MyChart, go to [email or web address].

## 2023-08-03 LAB — COMPREHENSIVE METABOLIC PANEL WITH GFR
ALT: 11 IU/L (ref 0–32)
AST: 13 IU/L (ref 0–40)
Albumin: 3.9 g/dL (ref 3.7–4.7)
Alkaline Phosphatase: 98 IU/L (ref 44–121)
BUN/Creatinine Ratio: 15 (ref 12–28)
BUN: 21 mg/dL (ref 8–27)
Bilirubin Total: 0.2 mg/dL (ref 0.0–1.2)
CO2: 22 mmol/L (ref 20–29)
Calcium: 9.4 mg/dL (ref 8.7–10.3)
Chloride: 106 mmol/L (ref 96–106)
Creatinine, Ser: 1.36 mg/dL — ABNORMAL HIGH (ref 0.57–1.00)
Globulin, Total: 2.5 g/dL (ref 1.5–4.5)
Glucose: 98 mg/dL (ref 70–99)
Potassium: 4.9 mmol/L (ref 3.5–5.2)
Sodium: 143 mmol/L (ref 134–144)
Total Protein: 6.4 g/dL (ref 6.0–8.5)
eGFR: 38 mL/min/{1.73_m2} — ABNORMAL LOW (ref >59)

## 2023-08-03 LAB — T4, FREE: Free T4: 1.51 ng/dL (ref 0.82–1.77)

## 2023-08-03 LAB — TSH: TSH: 1.18 u[IU]/mL (ref 0.450–4.500)

## 2023-08-13 ENCOUNTER — Encounter (INDEPENDENT_AMBULATORY_CARE_PROVIDER_SITE_OTHER): Payer: Self-pay

## 2023-08-14 ENCOUNTER — Ambulatory Visit (INDEPENDENT_AMBULATORY_CARE_PROVIDER_SITE_OTHER): Payer: Self-pay

## 2023-08-14 DIAGNOSIS — Z0000 Encounter for general adult medical examination without abnormal findings: Secondary | ICD-10-CM | POA: Diagnosis not present

## 2023-08-14 NOTE — Progress Notes (Signed)
 Subjective:   Kathryn [name] is a 89 y.o. who presents for a Medicare Wellness preventive visit.  As a reminder, Annual Wellness Visits don't include a physical exam, and some assessments may be limited, especially if this visit is performed virtually. We may recommend an in-person follow-up visit with your provider if needed.  Visit Complete: Virtual I connected with  Christelle L Bultema on 08/14/23 by a audio enabled telemedicine application and verified that I am speaking with the correct person using two identifiers.  Patient Location: Home  Provider Location: Home Office  I discussed the limitations of evaluation and management by telemedicine. The patient expressed understanding and agreed to proceed.  Vital Signs: Because this visit was a virtual/telehealth visit, some criteria may be missing or patient reported. Any vitals not documented were not able to be obtained and vitals that have been documented are patient reported.  VideoDeclined- This patient declined Librarian, academic. Therefore the visit was completed with audio only.  Persons Participating in Visit: Patient.  AWV Questionnaire: No: Patient Medicare AWV questionnaire was not completed prior to this visit.  Cardiac Risk Factors include: advanced age (>15men, >4 women);hypertension;diabetes mellitus;dyslipidemia;sedentary lifestyle;obesity (BMI >30kg/m2)     Objective:     There were no vitals filed for this visit. There is no height or weight on file to calculate BMI.     08/14/2023   10:18 AM 08/25/2022    7:00 PM 08/23/2022   12:00 AM 08/22/2022    4:49 PM 08/13/2022   11:00 AM 03/22/2022    9:27 PM 03/13/2022   10:33 AM  Advanced Directives  Does Patient Have a Medical Advance Directive? Yes  Yes Yes Yes No No  Type of Estate agent of [place];Living will  Healthcare Power of [employer] of [place];Living will     Does patient want to make  changes to medical advance directive? No - Patient declined  No - Patient declined      Copy of Healthcare Power of Attorney in Chart? Yes - validated most recent copy scanned in chart (See row information) Yes - validated most recent copy scanned in chart (See row information) No - copy requested No - copy requested     Would patient like information on creating a medical advance directive?      No - Patient declined No - Patient declined    Current Medications (verified) Outpatient Encounter Medications as of 08/14/2023  Medication Sig   acetaminophen  (TYLENOL ) 500 MG tablet Take 1,000 mg by mouth every 6 (six) hours as needed. Pain    albuterol  (PROVENTIL ) (2.5 MG/3ML) 0.083% nebulizer solution USE 1 VIAL VIA NEBULIZER EVERY 6 HOURS AS NEEDED FOR WHEEZING OR SHORTNESS OF BREATH   amiodarone  (PACERONE ) 200 MG tablet Take 0.5 tablets (100 mg total) by mouth daily.   aspirin  81 MG EC tablet Take 81 mg by mouth daily. Swallow whole.   atorvastatin  (LIPITOR) 40 MG tablet TAKE 1 TABLET(40 MG) BY MOUTH DAILY   carvedilol  (COREG ) 6.25 MG tablet TAKE 1 TABLET(6.25 MG) BY MOUTH TWICE DAILY WITH A MEAL   clopidogrel  (PLAVIX ) 75 MG tablet Take 1 tablet (75 mg total) by mouth daily. (Patient not taking: Reported on 08/14/2023)   dapagliflozin  propanediol (FARXIGA ) 10 MG TABS tablet Take 1 tablet (10 mg total) by mouth daily.   gabapentin  (NEURONTIN ) 400 MG capsule Take 1 capsule (400 mg total) by mouth 2 (two) times daily.   glucose blood (CONTOUR NEXT TEST) test  strip Check sugar once daily  DX E11.9   hydrOXYzine  (ATARAX ) 10 MG tablet TAKE 1 TO 2 TABLETS(10 TO 20 MG) BY MOUTH EVERY 6 HOURS AS NEEDED   levothyroxine  (SYNTHROID ) 100 MCG tablet TAKE 1 TABLET(100 MCG) BY MOUTH DAILY BEFORE BREAKFAST   metFORMIN  (GLUCOPHAGE ) 1000 MG tablet TAKE 1 TABLET(1000 MG) BY MOUTH DAILY WITH SUPPER   SPIRIVA  HANDIHALER 18 MCG inhalation capsule PLACE 1 CAPSULE INTO INHALER AND INHALE EVERY DAY AS NEEDED   torsemide   (DEMADEX ) 20 MG tablet Take 2 tablets (40 mg total) by mouth daily. May take an extra 20 mg after lunch as needed for swelling.   No facility-administered encounter medications on file as of 08/14/2023.    Allergies (verified) Oxycodone and Prednisone    History: Past Medical History:  Diagnosis Date   Actinic keratosis    Aortic atherosclerosis (HCC) 05/25/2020   Arthritis    spinal stenosis   Cardiomyopathy - presumed to be nonischemic    a. 03/2020 Echo: EF 40-45%, gr2 DD. Nl RV fxn. Mildly dil LA. Mild MR/ao sclerosis; b. 03/2020 MV: EF 30-44%, no ischemia. Cor Ca2+ in pLAD.   CHF (congestive heart failure) (HCC)    COPD (chronic obstructive pulmonary disease) (HCC)    Coronary artery calcification seen on CT scan    a. 03/2020 MV: CT attenuation images show Ao and mod pLAD Ca2+.   Diabetes mellitus without complication (HCC)    Gastric ulcer 06/2010   treated with Prolisec- states no problems now   Hyperlipidemia    Hypertension    PCP Dr Gustavo Leigh   [place]   Hypothyroidism    Left bundle branch block (LBBB)    Neuromuscular disorder (HCC)    slight numbness right toes- comes and goes   Peripheral vascular disease (HCC)    varicose veins left leg   Personal history of fall 10/05/2022   Pneumonia    PSVT (paroxysmal supraventricular tachycardia) (HCC)    a. 03/2020 Zio: Avg HR 87 (66-211).  4 beats NSVT.  43 PSVT episodes, longest 10h 4m @ avg of 153 bpm (max 211).  Seen by EP-->amio started (pt wished to avoid EPS/RFCA).   Seasonal allergies    Shortness of breath    Skin cancer    multiple from face   Squamous cell carcinoma of skin 03/23/2013   L dorsal hand   Squamous cell carcinoma of skin 02/04/2014   R forearm/in situ, L pretibial   Squamous cell carcinoma of skin 09/24/2018   R thumb   Squamous cell carcinoma of skin 09/08/2019   L forearm - ED&C   Squamous cell carcinoma of skin 09/08/2019   R dosal hand - ED&C   Squamous cell carcinoma of skin  06/20/2021   R cheek preauricular, excised   Vertebrobasilar circulation transient ischemic attack 09/17/2014   Past Surgical History:  Procedure Laterality Date   AMPUTATION TOE Right 08/26/2022   Procedure: AMPUTATION 2ND  TOE RIGHT FOOT;  Surgeon: Dot Gazella, DPM;  Location: [facility] ORS;  Service: Podiatry;  Laterality: Right;   BACK SURGERY     4/12  Dr Mevelyn Acton- for lumbar stenosis   BREAST CYST ASPIRATION Left 1965   negative   BREAST SURGERY  1962   lumpectomy with biopsy   left   CARPAL TUNNEL RELEASE     right   COLONOSCOPY     COLONOSCOPY WITH PROPOFOL  N/A 06/30/2015   Procedure: COLONOSCOPY WITH PROPOFOL ;  Surgeon: Stephens Eis, MD;  Location: [facility]  ENDOSCOPY;  Service: Gastroenterology;  Laterality: N/A;   COLONOSCOPY WITH PROPOFOL  N/A 01/22/2017   Procedure: COLONOSCOPY WITH PROPOFOL ;  Surgeon: Deveron Fly, MD;  Location: [facility] ENDOSCOPY;  Service: Endoscopy;  Laterality: N/A;   COLONOSCOPY WITH PROPOFOL  N/A 01/28/2019   Procedure: COLONOSCOPY WITH PROPOFOL ;  Surgeon: Toledo, Alphonsus Jeans, MD;  Location: [facility] ENDOSCOPY;  Service: Gastroenterology;  Laterality: N/A;   ESOPHAGOGASTRODUODENOSCOPY     ESOPHAGOGASTRODUODENOSCOPY (EGD) WITH PROPOFOL  N/A 01/28/2019   Procedure: ESOPHAGOGASTRODUODENOSCOPY (EGD) WITH PROPOFOL ;  Surgeon: Toledo, Alphonsus Jeans, MD;  Location: [facility] ENDOSCOPY;  Service: Gastroenterology;  Laterality: N/A;   EYE SURGERY     cataract extraction with IOL bilaterally   JOINT REPLACEMENT     LOWER EXTREMITY ANGIOGRAPHY Right 08/24/2022   Procedure: Lower Extremity Angiography;  Surgeon: Celso College, MD;  Location: [facility] INVASIVE CV LAB;  Service: Cardiovascular;  Laterality: Right;   LUMBAR LAMINECTOMY/DECOMPRESSION MICRODISCECTOMY  05/24/2011   Procedure: LUMBAR LAMINECTOMY/DECOMPRESSION MICRODISCECTOMY 2 LEVELS;  Surgeon: Verlinda Gloss, MD;  Location: [facility] ORS;  Service: Orthopedics;  Laterality: N/A;  L2-L3, L3-L4 (x-ray)   RIGHT OOPHORECTOMY     due to STAPH  INFECTION   Family History  Problem Relation Age of Onset   Dementia Mother    Lung cancer Father    Heart disease Father    Hypertension Sister    Breast cancer Maternal Aunt    Arthritis Son    Social History   Socioeconomic History   Marital status: Widowed    Spouse name: Not on file   Number of children: Not on file   Years of education: Not on file   Highest education level: Not on file  Occupational History   Not on file  Tobacco Use   Smoking status: Former    Current packs/day: 0.00    Average packs/day: 0.5 packs/day for 50.0 years (25.0 ttl pk-yrs)    Types: Cigarettes    Start date: 05/16/1954    Quit date: 05/16/2004    Years since quitting: 19.2   Smokeless tobacco: Never  Vaping Use   Vaping status: Never Used  Substance and Sexual Activity   Alcohol  use: Yes    Comment: socially-1 glass of wine or [place] icea tea once a month   Drug use: No   Sexual activity: Yes    Birth control/protection: None  Other Topics Concern   Not on file  Social History Narrative   Not on file   Social Drivers of Health   Financial Resource Strain: Low Risk  (08/14/2023)   Overall Financial Resource Strain (CARDIA)    Difficulty of Paying Living Expenses: Not hard at all  Food Insecurity: No Food Insecurity (08/14/2023)   Hunger Vital Sign    Worried About Running Out of Food in the Last Year: Never true    Ran Out of Food in the Last Year: Never true  Transportation Needs: No Transportation Needs (08/14/2023)   PRAPARE - Administrator, Civil Service (Medical): No    Lack of Transportation (Non-Medical): No  Physical Activity: Inactive (08/14/2023)   Exercise Vital Sign    Days of Exercise per Week: 0 days    Minutes of Exercise per Session: 0 min  Stress: No Stress Concern Present (08/14/2023)   [employer] of Occupational Health - Occupational Stress Questionnaire    Feeling of Stress : Not at all  Social Connections: Socially Isolated  (08/14/2023)   Social Connection and Isolation Panel [NHANES]    Frequency  of Communication with Friends and Family: More than three times a week    Frequency of Social Gatherings with Friends and Family: More than three times a week    Attends Religious Services: Never    Database administrator or Organizations: No    Attends Banker Meetings: Never    Marital Status: Widowed    Tobacco Counseling Counseling given: Not Answered    Clinical Intake:  Pre-visit preparation completed: Yes        BMI - recorded: 34 Nutritional Status: BMI > 30  Obese Nutritional Risks: None Diabetes: Yes CBG done?: No Did pt. bring in CBG monitor from home?: No  Lab Results  Component Value Date   HGBA1C 7.4 (H) 05/01/2023   HGBA1C 6.9 (H) 08/22/2022   HGBA1C 6.9 (H) 12/12/2021     How often do you need to have someone help you when you read instructions, pamphlets, or other written materials from your doctor or pharmacy?: 1 - Never  Interpreter Needed?: No  Information entered by :: Dellie Fergusson, LPN   Activities of Daily Living    08/14/2023   10:19 AM 10/05/2022    3:35 PM  In your present state of health, do you have any difficulty performing the following activities:  Hearing? 1 1  Vision? 0 1  Difficulty concentrating or making decisions? 0 0  Walking or climbing stairs? 1 1  Dressing or bathing? 0 0  Doing errands, shopping? 1 1  Preparing Food and eating ? N   Using the Toilet? N   In the past six months, have you accidently leaked urine? Y   Do you have problems with loss of bowel control? N   Managing your Medications? N   Managing your Finances? N   Housekeeping or managing your Housekeeping? N     Patient Care Team: Devorah Fonder, MD as PCP - Cardiology (Cardiology) Boyce Byes, MD as PCP - Electrophysiology (Cardiology) Molli Angelucci, MD as Consulting Physician (Orthopedic Surgery) [facility]  Indicate any recent  Medical Services you may have received from other than [facility] providers in the past year (date may be approximate).     Assessment:    This is a routine wellness examination for [place].  Hearing/Vision screen Hearing Screening - Comments:: WEARS AIDS, BOTH EARS Vision Screening - Comments:: WEARS GLASSES ALL DAY- [email or web address]   Goals Addressed             This Visit's Progress    DIET - INCREASE WATER INTAKE         Depression Screen     08/14/2023   10:16 AM 05/01/2023    3:27 PM 10/05/2022    3:34 PM 08/22/2022    4:06 PM 08/13/2022   10:57 AM 04/05/2022    2:20 PM 11/16/2021    3:22 PM  PHQ 2/9 Scores  PHQ - 2 Score 0 1 3 6 1 1 3   PHQ- 9 Score 0 6 5 15  4 9     Fall Risk     08/14/2023   10:19 AM 05/01/2023    3:27 PM 10/05/2022    3:33 PM 08/22/2022    4:06 PM 08/13/2022   10:54 AM  Fall Risk   Falls in the past year? 1 1 1 1 1   Number falls in past yr: 1 1 1  0 1  Injury with Fall? 0 0 1 0 0  Risk for fall due to : History of fall(s)  History of fall(s)  Follow up Falls evaluation completed;Falls prevention discussed    Education provided;Falls prevention discussed    MEDICARE RISK AT HOME:  Medicare Risk at Home Any stairs in or around the home?: Yes If so, are there any without handrails?: No Home free of loose throw rugs in walkways, pet beds, electrical cords, etc?: Yes Adequate lighting in your home to reduce risk of falls?: Yes Life alert?: No Use of a cane, walker or w/c?: Yes (WALKER AL THE TIME) Grab bars in the bathroom?: No Shower chair or bench in shower?: Yes Elevated toilet seat or a handicapped toilet?: Yes  TIMED UP AND GO:  Was the test performed?  No  Cognitive Function: 6CIT completed        08/13/2022   11:02 AM 11/04/2018    2:11 PM 08/07/2016   11:04 AM  6CIT Screen  What Year? 0 points 0 points 0 points  What month? 0 points 0 points 0 points  What time? 0 points 0 points 0 points  Count back from 20 0 points 0 points 0 points   Months in reverse 0 points 0 points 0 points  Repeat phrase 0 points 0 points 2 points  Total Score 0 points 0 points 2 points    Immunizations Immunization History  Administered Date(s) Administered   Fluad Quad(high Dose 65+) 11/27/2018, 12/01/2019, 01/04/2021, 12/12/2021   Influenza, High Dose Seasonal PF 12/18/2016, 12/12/2017   Influenza,inj,Quad PF,6+ Mos 11/12/2013   Influenza-Unspecified 11/25/2014, 12/24/2017   Pneumococcal Conjugate-13 08/07/2016   Pneumococcal Polysaccharide-23 03/27/2003   Tdap 02/10/2013   Zoster, Live 02/10/2013, 11/26/2014    Screening Tests Health Maintenance  Topic Date Due   COVID-19 Vaccine (1) Never done   Zoster Vaccines- Shingrix (1 of 2) 09/18/1952   OPHTHALMOLOGY EXAM  12/28/2021   DTaP/Tdap/Td (2 - Td or Tdap) 02/11/2023   INFLUENZA VACCINE  10/25/2023   HEMOGLOBIN A1C  10/29/2023   FOOT EXAM  04/30/2024   Medicare Annual Wellness (AWV)  08/13/2024   Pneumonia Vaccine 9+ Years old  Completed   DEXA SCAN  Completed   HPV VACCINES  Aged Out   Meningococcal B Vaccine  Aged Out    Health Maintenance  Health Maintenance Due  Topic Date Due   COVID-19 Vaccine (1) Never done   Zoster Vaccines- Shingrix (1 of 2) 09/18/1952   OPHTHALMOLOGY EXAM  12/28/2021   DTaP/Tdap/Td (2 - Td or Tdap) 02/11/2023   Health Maintenance Items Addressed: UP TO DATE ON EYE EXAM (LAST 6 MONTHS); NEEDS SHINGRIX, PNA; WANTS NO MORE COVIDS  Additional Screening:  Vision Screening: Recommended annual ophthalmology exams for early detection of glaucoma and other disorders of the eye.  Dental Screening: Recommended annual dental exams for proper oral hygiene  Community Resource Referral / Chronic Care Management: CRR required this visit?  No   CCM required this visit?  No   Plan:    I have personally reviewed and noted the following in the patient's chart:   Medical and social history Use of alcohol , tobacco or illicit drugs  Current  medications and supplements including opioid prescriptions. Patient is not currently taking opioid prescriptions. Functional ability and status Nutritional status Physical activity Advanced directives List of other physicians Hospitalizations, surgeries, and ER visits in previous 12 months Vitals Screenings to include cognitive, depression, and falls Referrals and appointments  In addition, I have reviewed and discussed with patient certain preventive protocols, quality metrics, and best practice recommendations. A written personalized care plan  for preventive services as well as general preventive health recommendations were provided to patient.   Pinky Bright, LPN   [id number]   After Visit Summary: (MyChart) Due to this being a telephonic visit, the after visit summary with patients personalized plan was offered to patient via MyChart   Notes: Nothing significant to report at this time.

## 2023-08-14 NOTE — Patient Instructions (Signed)
 Kathryn [name] , Thank you for taking time out of your busy schedule to complete your Annual Wellness Visit with me. I enjoyed our conversation and look forward to speaking with you again next year. I, as well as your care team,  appreciate your ongoing commitment to your health goals. Please review the following plan we discussed and let me know if I can assist you in the future.   Follow up Visits: Next Medicare AWV with our clinical staff: 08/25/24 @ 9:30 AM BY PHONE   Have you seen your provider in the last 6 months (3 months if uncontrolled diabetes)? Yes   Clinician Recommendations:  Aim for 30 minutes of exercise or brisk walking, 6-8 glasses of water, and 5 servings of fruits and vegetables each day. TAKE CARE!      This is a list of the screening recommended for you and due dates:  Health Maintenance  Topic Date Due   COVID-19 Vaccine (1) Never done   Zoster (Shingles) Vaccine (1 of 2) 09/18/1952   Eye exam for diabetics  12/28/2021   DTaP/Tdap/Td vaccine (2 - Td or Tdap) 02/11/2023   Flu Shot  10/25/2023   Hemoglobin A1C  10/29/2023   Complete foot exam   04/30/2024   Medicare Annual Wellness Visit  08/13/2024   Pneumonia Vaccine  Completed   DEXA scan (bone density measurement)  Completed   HPV Vaccine  Aged Out   Meningitis B Vaccine  Aged Out    Advanced directives: (In Chart) A copy of your advanced directives are scanned into your chart should your provider ever need it. Advance Care Planning is important because it:  [x]  Makes sure you receive the medical care that is consistent with your values, goals, and preferences  [x]  It provides guidance to your family and loved ones and reduces their decisional burden about whether or not they are making the right decisions based on your wishes.  Follow the link provided in your after visit summary or read over the paperwork we have mailed to you to help you started getting your Advance Directives in place. If you need assistance  in completing these, please reach out to us  so that we can help you!

## 2023-08-24 ENCOUNTER — Other Ambulatory Visit: Payer: Self-pay | Admitting: Cardiovascular Disease

## 2023-09-09 ENCOUNTER — Ambulatory Visit: Admitting: Cardiovascular Disease

## 2023-09-12 ENCOUNTER — Other Ambulatory Visit: Payer: Self-pay | Admitting: Family Medicine

## 2023-09-12 ENCOUNTER — Other Ambulatory Visit: Payer: Self-pay | Admitting: Cardiovascular Disease

## 2023-09-12 DIAGNOSIS — E119 Type 2 diabetes mellitus without complications: Secondary | ICD-10-CM

## 2023-10-26 ENCOUNTER — Other Ambulatory Visit: Payer: Self-pay | Admitting: Family Medicine

## 2023-10-26 DIAGNOSIS — E119 Type 2 diabetes mellitus without complications: Secondary | ICD-10-CM

## 2023-11-17 ENCOUNTER — Other Ambulatory Visit: Payer: Self-pay | Admitting: Family Medicine

## 2023-11-19 ENCOUNTER — Ambulatory Visit

## 2023-11-19 DIAGNOSIS — L570 Actinic keratosis: Secondary | ICD-10-CM | POA: Diagnosis not present

## 2023-11-19 DIAGNOSIS — D489 Neoplasm of uncertain behavior, unspecified: Secondary | ICD-10-CM

## 2023-11-19 DIAGNOSIS — L578 Other skin changes due to chronic exposure to nonionizing radiation: Secondary | ICD-10-CM

## 2023-11-19 DIAGNOSIS — L814 Other melanin hyperpigmentation: Secondary | ICD-10-CM

## 2023-11-19 DIAGNOSIS — C44320 Squamous cell carcinoma of skin of unspecified parts of face: Secondary | ICD-10-CM | POA: Diagnosis not present

## 2023-11-19 DIAGNOSIS — W908XXA Exposure to other nonionizing radiation, initial encounter: Secondary | ICD-10-CM

## 2023-11-19 DIAGNOSIS — D485 Neoplasm of uncertain behavior of skin: Secondary | ICD-10-CM

## 2023-11-19 DIAGNOSIS — Z85828 Personal history of other malignant neoplasm of skin: Secondary | ICD-10-CM

## 2023-11-19 DIAGNOSIS — C4490 Unspecified malignant neoplasm of skin, unspecified: Secondary | ICD-10-CM

## 2023-11-19 DIAGNOSIS — L821 Other seborrheic keratosis: Secondary | ICD-10-CM | POA: Diagnosis not present

## 2023-11-19 DIAGNOSIS — Z1283 Encounter for screening for malignant neoplasm of skin: Secondary | ICD-10-CM

## 2023-11-19 DIAGNOSIS — C4492 Squamous cell carcinoma of skin, unspecified: Secondary | ICD-10-CM

## 2023-11-19 HISTORY — DX: Squamous cell carcinoma of skin, unspecified: C44.92

## 2023-11-19 NOTE — Telephone Encounter (Signed)
 Requested Prescriptions  Refused Prescriptions Disp Refills   gabapentin  (NEURONTIN ) 400 MG capsule [Pharmacy Med Name: Gabapentin  400 MG Oral Capsule] 180 capsule 3    Sig: TAKE 1 CAPSULE BY MOUTH TWICE  DAILY     Neurology: Anticonvulsants - gabapentin  Failed - 11/19/2023 11:36 AM      Failed - Cr in normal range and within 360 days    Creatinine  Date Value Ref Range Status  07/09/2014 0.85 mg/dL Final    Comment:    9.55-8.99 NOTE: New Reference Range  06/01/14    Creatinine, Ser  Date Value Ref Range Status  08/02/2023 1.36 (H) 0.57 - 1.00 mg/dL Final         Passed - Completed PHQ-2 or PHQ-9 in the last 360 days      Passed - Valid encounter within last 12 months    Recent Outpatient Visits           6 months ago HFrEF (heart failure with reduced ejection fraction) (HCC)   [facility] [name], Rockie, MD       Future Appointments             Today [place], Lauraine BROCKS, MD [facility]   In 2 months Riddle, Suzann, NP [facility]

## 2023-11-19 NOTE — Patient Instructions (Addendum)

## 2023-11-19 NOTE — Progress Notes (Signed)
 Follow-Up Visit   Subjective  Kathryn [name] is a 89 y.o. female who presents for the following: Spot of concern on right cheek.    The following portions of the chart were reviewed this encounter and updated as appropriate: medications, allergies, medical history  Review of Systems:  No other skin or systemic complaints except as noted in HPI or Assessment and Plan.  Objective  Well appearing patient in no apparent distress; mood and affect are within normal limits.   A focused examination was performed of the following areas: Face, neck, arms, chest, upper back   - Actinic Elastosis: chronic sun damage: dyspigmentation, telangiectasia, and wrinkling - 6-20 mm pigmented macules that are tan to brown in color and are somewhat non-uniform in shape and concentrated in the sun-exposed areas - Multiple stuck-on brown, tan and grey papillated papules and plaques     Relevant exam findings are noted in the Assessment and Plan.  R malar cheek 1.5cm pink plaque - domed with central hyperkeratotic papule.  L forearm, nose, L cheek, forehead, R forearm, R temple (23) Pink scaly macules  Assessment & Plan   SKIN CANCER SCREENING PERFORMED TODAY - focused skin exam   BENIGN SKIN FINDINGS  - Lentigines  - Seborrheic keratoses - Reassurance provided regarding the benign appearance of lesions noted on exam today; no treatment is indicated in the absence of symptoms/changes. - Reinforced importance of photoprotective strategies including liberal and frequent sunscreen use of a broad-spectrum SPF 30 or greater, use of protective clothing, and sun avoidance for prevention of cutaneous malignancy and photoaging.  Counseled patient on the importance of regular self-skin monitoring as well as routine clinical skin examinations as scheduled.   ACTINIC DAMAGE - Chronic condition, secondary to cumulative UV/sun exposure - Recommend daily broad spectrum sunscreen SPF 30+ to sun-exposed areas,  reapply every 2 hours as needed.  - Staying in the shade or wearing long sleeves, sun glasses (UVA+UVB protection) and wide brim hats (4-inch brim around the entire circumference of the hat) are also recommended for sun protection.  - Call for new or changing lesions. - Discussed efudex  - deferred   Personal history of non melanoma skin cancer  - Reviewed sun protective measures as above - Encouraged full body skin exams   NEOPLASM OF UNCERTAIN BEHAVIOR R malar cheek Skin / nail biopsy Type of biopsy: tangential   Informed consent: discussed and consent obtained   Patient was prepped and draped in usual sterile fashion: Area prepped with alcohol . Anesthesia: the lesion was anesthetized in a standard fashion   Anesthetic:  1% lidocaine  w/ epinephrine 1-100,000 buffered w/ 8.4% NaHCO3 Instrument used: flexible razor blade   Hemostasis achieved with: pressure, aluminum chloride and electrodesiccation   Outcome: patient tolerated procedure well   Post-procedure details: wound care instructions given   Post-procedure details comment:  Ointment and small bandage applied  Specimen 1 - Surgical pathology Differential Diagnosis: Keratoacanthoma vs SCC vs BCC  Check Margins: No ACTINIC KERATOSIS (23) L forearm, nose, L cheek, forehead, R forearm, R temple (23) Actinic keratoses are precancerous spots that appear secondary to cumulative UV radiation exposure/sun exposure over time. They are chronic with expected duration over 1 year. A portion of actinic keratoses will progress to squamous cell carcinoma of the skin. It is not possible to reliably predict which spots will progress to skin cancer and so treatment is recommended to prevent development of skin cancer.  Recommend daily broad spectrum sunscreen SPF 30+ to sun-exposed areas, reapply  every 2 hours as needed.  Recommend staying in the shade or wearing long sleeves, sun glasses (UVA+UVB protection) and wide brim hats (4-inch brim around  the entire circumference of the hat). Call for new or changing lesions. Destruction of lesion - L forearm, nose, L cheek, forehead, R forearm, R temple (23)  Destruction method: cryotherapy   Informed consent: discussed and consent obtained   Lesion destroyed using liquid nitrogen: Yes   Region frozen until ice ball extended beyond lesion: Yes   Outcome: patient tolerated procedure well with no complications   Post-procedure details: wound care instructions given   Additional details:  Prior to procedure, discussed risks of blister formation, small wound, skin dyspigmentation, or rare scar following cryotherapy. Recommend Vaseline ointment to treated areas while healing.    Return in about 3 months (around 02/19/2024) for TBSE.  I, Emerick Ege, CMA am acting as scribe for Lauraine JAYSON Kanaris, MD.    Documentation: I have reviewed the above documentation for accuracy and completeness, and I agree with the above.  Lauraine JAYSON Kanaris, MD

## 2023-11-20 LAB — SURGICAL PATHOLOGY

## 2023-11-21 ENCOUNTER — Ambulatory Visit: Payer: Self-pay

## 2023-11-21 DIAGNOSIS — C4492 Squamous cell carcinoma of skin, unspecified: Secondary | ICD-10-CM

## 2023-11-21 NOTE — Addendum Note (Signed)
 Addended by: TERESA PALMA R on: 11/21/2023 11:35 AM   Modules accepted: Orders

## 2023-11-21 NOTE — Telephone Encounter (Signed)
 Patient advised and referral sent to Dr. Fain Home. aw

## 2023-11-21 NOTE — Telephone Encounter (Signed)
-----   Message from Lauraine JAYSON Kanaris sent at 11/21/2023  9:14 AM EDT -----  1. Skin, R malar cheek :       SQUAMOUS CELL CARCINOMA, KERATOACANTHOMA TYPE   Please notify patient with below plan:  Mohs   This is a type of skin cancer (NOT melanoma). These kinds of skin cancer typically do not represent an immediate threat to your health, but we recommend treating them because they do have the  potential to grow, bleed, invade local structures, and even spread to other parts of the body if they are not treated.    Given the size and location of the lesion of concern in your case, we recommend treatment with Mohs surgery. Mohs surgery is performed outpatient.    Mohs surgery begins with numbing injections (like the injections you received prior to your biopsy). Once the area is numb, the Mohs surgeon will remove the affected tissue and examine it under the  microscope. If there are any margins of the removed tissue that show residual cancer cells the surgeon will remove another small piece of tissue and possibly multiple pieces until the margins appear  to be clear of cancer cells. It can take an hour to process tissue each time so the process as a whole can take up to an entire day. Once the margins are clear, the Mohs surgeon will repair the area  with stitches (which usually dissolve on their own, but in special circumstances may need a brief nurse visit to remove).    Mohs surgery is an exceptionally definitive treatment option for skin cancers. It is a very precise surgical procedure which allows for the highest chance of removing all the cancerous tissue while  preserving as much of the normal tissue as possible. I will submit a referral to our Mohs surgeons, and you will be contacted by our office to schedule the procedure.    As always, please send us  a message or call our office   if you have any questions.   Thank you for allowing us  to care for you.  ----- Message ----- From: Interface,  Lab In Three Zero One Sent: 11/20/2023   5:19 PM EDT To: Lauraine JAYSON Kanaris, MD

## 2023-12-10 ENCOUNTER — Encounter: Payer: Self-pay | Admitting: Dermatology

## 2023-12-10 ENCOUNTER — Ambulatory Visit: Admitting: Dermatology

## 2023-12-10 VITALS — BP 111/67 | HR 72 | Temp 97.8°F

## 2023-12-10 DIAGNOSIS — L578 Other skin changes due to chronic exposure to nonionizing radiation: Secondary | ICD-10-CM

## 2023-12-10 DIAGNOSIS — C44320 Squamous cell carcinoma of skin of unspecified parts of face: Secondary | ICD-10-CM

## 2023-12-10 DIAGNOSIS — L814 Other melanin hyperpigmentation: Secondary | ICD-10-CM

## 2023-12-10 DIAGNOSIS — W908XXA Exposure to other nonionizing radiation, initial encounter: Secondary | ICD-10-CM | POA: Diagnosis not present

## 2023-12-10 DIAGNOSIS — C4492 Squamous cell carcinoma of skin, unspecified: Secondary | ICD-10-CM

## 2023-12-10 DIAGNOSIS — C44329 Squamous cell carcinoma of skin of other parts of face: Secondary | ICD-10-CM | POA: Diagnosis not present

## 2023-12-10 MED ORDER — TRAMADOL HCL 50 MG PO TABS: 50.0000 mg | ORAL_TABLET | Freq: Four times a day (QID) | ORAL | 0 refills | Status: DC | PRN

## 2023-12-10 NOTE — Progress Notes (Unsigned)
 Follow-Up Visit   Subjective  Kathryn [name] is a 89 y.o. female who presents for the following: Mohs of a Squamous Cell Carcinoma in situ of the right malar cheek, referred by Dr. Raymund.  The following portions of the chart were reviewed this encounter and updated as appropriate: medications, allergies, medical history  Review of Systems:  No other skin or systemic complaints except as noted in HPI or Assessment and Plan.  Objective  Well appearing patient in no apparent distress; mood and affect are within normal limits.  A focused examination was performed of the following areas: Right malar cheek Relevant physical exam findings are noted in the Assessment and Plan.   Right Malar Cheek Hyperkeratotic plaque   Assessment & Plan   SQUAMOUS CELL CARCINOMA OF SKIN Right Malar Cheek Mohs surgery  Consent obtained: written  Anticoagulation: Was the anticoagulation regimen changed prior to Mohs? No    Anesthesia: Anesthesia method: local infiltration Local anesthetic: lidocaine  1% WITH epi  Procedure Details: Timeout: pre-procedure verification complete Procedure Prep: patient was prepped and draped in usual sterile fashion Prep type: chlorhexidine  Biopsy accession number: [id number] Pre-Op diagnosis: squamous cell carcinoma SCC subtype: KA type MohsAIQ Surgical site (if tumor spans multiple areas, please select predominant area): cheek (including jawline) Surgery side: right Surgical site (from skin exam): Right Malar Cheek Pre-operative length (cm): 1.3 Pre-operative width (cm): 1.4 Indications for Mohs surgery: anatomic location where tissue conservation is critical  Micrographic Surgery Details: Post-operative length (cm): 2.5 Post-operative width (cm): 2.8 Number of Mohs stages: 2 Post surgery depth of defect: dermis and subcutaneous fat  Stage 1    Tumor features identified on Mohs section: squamous cell carcinoma  Stage 2    Tumor features  identified on Mohs section: no tumor identified  Reconstruction: Was the defect reconstructed? Yes   Was reconstruction performed by the same Mohs surgeon? Yes   Setting of reconstruction: outpatient office When was reconstruction performed? same day Type of reconstruction: linear Linear reconstruction: complex  Opioids: Did the patient receive a prescription for opioid/narcotic related to Mohs surgery? Yes    Skin repair Complexity:  Complex Final length (cm):  5.5 Informed consent: discussed and consent obtained   Timeout: patient name, date of birth, surgical site, and procedure verified   Procedure prep:  Patient was prepped and draped in usual sterile fashion Prep type:  Chlorhexidine  Anesthesia: the lesion was anesthetized in a standard fashion   Anesthetic:  1% lidocaine  w/ epinephrine 1-100,000 buffered w/ 8.4% NaHCO3 Reason for type of repair: reduce tension to allow closure, preserve normal anatomy, preserve normal anatomical and functional relationships, avoid adjacent structures and allow side-to-side closure without requiring a flap or graft   Undermining: area extensively undermined   Subcutaneous layers (deep stitches):  Suture size:  5-0 Suture type: Monocryl (poliglecaprone 25)   Stitches:  Buried vertical mattress Fine/surface layer approximation (top stitches):  Suture size:  6-0 Suture type: fast-absorbing plain gut   Stitches: simple running   Hemostasis achieved with: suture, pressure and electrodesiccation Outcome: patient tolerated procedure well with no complications   Post-procedure details: sterile dressing applied and wound care instructions given   Dressing type: petrolatum and pressure dressing    Related Medications traMADol  (ULTRAM ) 50 MG tablet Take 1 tablet (50 mg total) by mouth every 6 (six) hours as needed for up to 8 doses.   Return in about 4 weeks (around 01/07/2024) for Mohs f/u .  LILLETTE Berwyn Lesches, Surg Tech III, am acting  as  scribe for RUFUS CHRISTELLA HOLY, MD.    12/11/2023  HISTORY OF PRESENT ILLNESS  Kathryn [name] is seen in consultation at the request of Dr. Raymund for biopsy-proven Squamous Cell Carcinoma KA type of the right malar cheek. They note that the area has been present for about 4 months increasing in size with time.  There is no history of previous treatment.  Reports no other new or changing lesions and has no other complaints today.  Medications and allergies: see patient chart.  Review of systems: Reviewed 8 systems and notable for the above skin cancer.  All other systems reviewed are unremarkable/negative, unless noted in the HPI. Past medical history, surgical history, family history, social history were also reviewed and are noted in the chart/questionnaire.    PHYSICAL EXAMINATION  General: Well-appearing, in no acute distress, alert and oriented x 4. Vitals reviewed in chart (if available).   Skin: Exam reveals a 1.3 x 1.4 cm erythematous papule and biopsy scar on the right malar cheek. There are rhytids, telangiectasias, and lentigines, consistent with photodamage.   Biopsy report(s) reviewed, confirming the diagnosis.   ASSESSMENT  1) Squamous Cell Carcinoma- KA type on the right cheek 2) photodamage 3) solar lentigines   PLAN   1. Due to location, size, histology, or recurrence and the likelihood of subclinical extension as well as the need to conserve normal surrounding tissue, the patient was deemed acceptable for Mohs micrographic surgery (MMS).  The nature and purpose of the procedure, associated benefits and risks including recurrence and scarring, possible complications such as pain, infection, and bleeding, and alternative methods of treatment if appropriate were discussed with the patient during consent. The lesion location was verified by the patient, by reviewing previous notes, pathology reports, and by photographs as well as angulation measurements if available.  Informed  consent was reviewed and signed by the patient, and timeout was performed at 10:00 AM. See op note below.  2. For the photodamage and solar lentigines, sun protection discussed/information given on OTC sunscreens, and we recommend continued regular follow-up with primary dermatologist every 6 months or sooner for any growing, bleeding, or changing lesions. 3. Prognosis and future surveillance discussed. 4. Letter with treatment outcome sent to referring provider. 5. Pain acetaminophen /ibuprofen/tramadol  50 mg   MOHS MICROGRAPHIC SURGERY AND RECONSTRUCTION  Initial size:   1.3 x 1.4 cm Surgical defect/wound size: 2.5 x 2.8 cm Anesthesia:    0.33% lidocaine  with 1:200,000 epinephrine EBL:    <5 mL Complications:  None Repair type:   Complex SQ suture:   5-0 Monocryl Cutaneous suture:  6-0 Plain gut Final size of the repair: 5.5 cm  Stages: 2  STAGE I: Anesthesia achieved with 0.5% lidocaine  with 1:200,000 epinephrine. ChloraPrep applied. 1 section(s) excised using Mohs technique (this includes total peripheral and deep tissue margin excision and evaluation with frozen sections, excised and interpreted by the same physician). The tumor was first debulked and then excised with an approx. 2 mm margin.  Hemostasis was achieved with electrocautery as needed.  The specimen was then oriented, subdivided/relaxed, inked, and processed using Mohs technique.    Frozen section analysis revealed a positive margin for atypical epithelial cells with squamous differentiation in the dermis in the deep margin.    STAGE II: An additional 2 mm margin was excised.  Hemostasis was achieved with electrocautery as needed.  The specimen was then oriented, subdivided/relaxed, inked, and processed using Mohs technique. Evaluation of slides by the Mohs surgeon revealed clear  tumor margins.   Reconstruction  The surgical wound was then cleaned, prepped, and re-anesthetized as above. Wound edges were undermined  extensively along at least one entire edge and at a distance equal to or greater than the width of the defect (see wound defect size above) in order to achieve closure and decrease wound tension and anatomic distortion. Redundant tissue repair including standing cone removal was performed. Hemostasis was achieved with electrocautery. Subcutaneous and epidermal tissues were approximated with the above sutures. The surgical site was then lightly scrubbed with sterile, saline-soaked gauze. The area was then bandaged using Vaseline ointment, non-adherent gauze, gauze pads, and tape to provide an adequate pressure dressing. The patient tolerated the procedure well, was given detailed written and verbal wound care instructions, and was discharged in good condition.   The patient will follow-up: 4 weeks.    Documentation: I have reviewed the above documentation for accuracy and completeness, and I agree with the above.  RUFUS CHRISTELLA HOLY, MD

## 2023-12-10 NOTE — Patient Instructions (Signed)

## 2023-12-19 ENCOUNTER — Encounter: Payer: Self-pay | Admitting: Dermatology

## 2024-01-07 ENCOUNTER — Encounter: Admitting: Dermatology

## 2024-01-08 ENCOUNTER — Encounter: Payer: Self-pay | Admitting: Dermatology

## 2024-01-08 ENCOUNTER — Ambulatory Visit: Admitting: Dermatology

## 2024-01-08 DIAGNOSIS — Z48817 Encounter for surgical aftercare following surgery on the skin and subcutaneous tissue: Secondary | ICD-10-CM | POA: Diagnosis not present

## 2024-01-08 DIAGNOSIS — C4492 Squamous cell carcinoma of skin, unspecified: Secondary | ICD-10-CM

## 2024-01-08 DIAGNOSIS — T8131XA Disruption of external operation (surgical) wound, not elsewhere classified, initial encounter: Secondary | ICD-10-CM

## 2024-01-08 DIAGNOSIS — T1490XD Injury, unspecified, subsequent encounter: Secondary | ICD-10-CM

## 2024-01-08 DIAGNOSIS — Z85828 Personal history of other malignant neoplasm of skin: Secondary | ICD-10-CM

## 2024-01-08 NOTE — Patient Instructions (Addendum)
 Post-Operative Scar Care: Education and Recommendations  Following your procedure, it's important to care for your scar to promote optimal healing and minimize its appearance. Proper post-operative care can help ensure that the scar heals well, and with time, it may become less noticeable. Below are key recommendations for scar care, including scar massage and the use of silicone scar gels or sheets.  1. General Scar Care Tips: -  Keep the wound clean and dry: Follow your healthcare provider's instructions for wound care, including cleaning the site and changing dressings as needed. -  Avoid sun exposure: Direct sunlight can darken scars and make them more noticeable. Once your wound has healed, apply sunscreen (SPF 30 or higher) to protect the scar from UV rays.  2. Scar Massage: - Start after healing: Wait until the scar has fully healed, with no scabs or open areas (usually 4-6 weeks after surgery). Your healthcare provider will give you specific guidance on when to begin. - Technique: Gently massage the scar in a circular motion for 5-10 minutes, 2-3 times per day. This helps to soften the tissue, reduce swelling, and improve the overall appearance of the scar. - Pressure: Apply gentle, firm pressure during the massage to break down the dense tissue that may form during healing. This helps to prevent the formation of keloids or hypertrophic scars. - Use lotion or ointment: Consider using a mild, fragrance-free lotion or vitamin E ointment to help lubricate the area during massage.  3. Silicone Scar Gels or Sheets: - When to start: Once your wound has healed completely, typically around 4-6 weeks, you can begin using silicone-based scar gels or sheets. These have been shown to improve scar appearance by hydrating the tissue and reducing inflammation. - How to use silicone gels: Apply a thin layer of the gel to the scar and allow it to dry before covering with clothing. You can use the  gel multiple times a day, depending on your provider's recommendation. - How to use silicone sheets: Cut the sheet to fit the size of your scar, and apply it directly to the healed scar. Wear it for 12-24 hours a day, and replace the sheet every few days as directed. - Benefits: Silicone helps reduce redness, flatten the scar, and improve its texture. Continued use over several months can lead to significant improvement in the appearance of the scar.  4. What to Expect: - Healing process: Scars generally take time to mature. The first few months may show redness or swelling, but this usually improves as healing progresses. - Long-term care: Scarring is a natural part of the healing process. While you cannot completely eliminate a scar, proper care can significantly improve its appearance over time. - Patience: It can take up to a year for a scar to fully mature, so it's important to be consistent with scar care and follow-up appointments with your provider.  5. When to Contact Your Healthcare Provider: - If you notice signs of infection (increased redness, warmth, drainage, or pain). - If your scar becomes unusually raised, itchy, or changes in color significantly. - If you have concerns about the appearance of your scar or experience unusual symptoms. - By following these guidelines, you can support your body's natural healing process and help ensure the best possible outcome for your scar. If you have any questions or concerns, please don't hesitate to contact our office.    Important Information   Due to recent changes in healthcare laws, you may  see results of your pathology and/or laboratory studies on MyChart before the doctors have had a chance to review them. We understand that in some cases there may be results that are confusing or concerning to you. Please understand that not all results are received at the same time and often the doctors may need to interpret multiple results in order  to provide you with the best plan of care or course of treatment. Therefore, we ask that you please give us  2 business days to thoroughly review all your results before contacting the office for clarification. Should we see a critical lab result, you will be contacted sooner.     If You Need Anything After Your Visit   If you have any questions or concerns for your doctor, please call our main line at [phone]. If no one answers, please leave a voicemail as directed and we will return your call as soon as possible. Messages left after 4 pm will be answered the following business day.    You may also send us  a message via MyChart. We typically respond to MyChart messages within 1-2 business days.  For prescription refills, please ask your pharmacy to contact our office. Our fax number is [phone].  If you have an urgent issue when the clinic is closed that cannot wait until the next business day, you can page your doctor at the number below.     Please note that while we do our best to be available for urgent issues outside of office hours, we are not available 24/7.    If you have an urgent issue and are unable to reach us , you may choose to seek medical care at your doctor's office, retail clinic, urgent care center, or emergency room.   If you have a medical emergency, please immediately call 911 or go to the emergency department. In the event of inclement weather, please call our main line at [phone] for an update on the status of any delays or closures.  Dermatology Medication Tips: Please keep the boxes that topical medications come in in order to help keep track of the instructions about where and how to use these. Pharmacies typically print the medication instructions only on the boxes and not directly on the medication tubes.   If your medication is too expensive, please contact our office at [phone] or send us  a message through MyChart.    We are unable to tell  what your co-pay for medications will be in advance as this is different depending on your insurance coverage. However, we may be able to find a substitute medication at lower cost or fill out paperwork to get insurance to cover a needed medication.    If a prior authorization is required to get your medication covered by your insurance company, please allow us  1-2 business days to complete this process.   Drug prices often vary depending on where the prescription is filled and some pharmacies may offer cheaper prices.   The website www.goodrx.com contains coupons for medications through different pharmacies. The prices here do not account for what the cost may be with help from insurance (it may be cheaper with your insurance), but the website can give you the price if you did not use any insurance.  - You can print the associated coupon and take it with your prescription to the pharmacy.  - You may also stop by our office during regular business hours and pick up a GoodRx coupon card.  - If  you need your prescription sent electronically to a different pharmacy, notify our office through [facility] or by phone at [phone]

## 2024-01-08 NOTE — Progress Notes (Signed)
   Follow Up Visit   Subjective  Kathryn [name] is a 89 y.o. female who presents for the following: follow up from Mohs surgery   The patient presents for follow up from Mohs surgery for a SCC on the right malar cheek, treated on 12/10/23, repaired with linear closure. The patient has been bandaging the wound as directed. The endorse the following concerns: did bleed a few times recently  The following portions of the chart were reviewed this encounter and updated as appropriate: medications, allergies, medical history  Review of Systems:  No other skin or systemic complaints except as noted in HPI or Assessment and Plan.  Objective  Well appearing patient in no apparent distress; mood and affect are within normal limits.  A focal examination was performed including scalp, head, face. All findings within normal limits unless otherwise noted below.  Healing wound with mild erythema  Relevant physical exam findings are noted in the Assessment and Plan.    Assessment & Plan   Healing Wound s/p Mohs for SCC on the right cheek, treated on 12/10/23, repaired with linear closure - Reassured that wound is healing well - Mild spitting sutures - No evidence of infection - No swelling, induration, purulence, dehiscence, or tenderness out of proportion to the clinical exam, see photo above - Discussed that scars take up to 12 months to mature from the date of surgery - Recommend SPF 30+ to scar daily to prevent purple color from UV exposure during scar maturation process - Discussed that erythema and raised appearance of scar will fade over the next 4-6 months - OK to start scar massage at 4-6 weeks post-op - Can consider silicone based products for scar healing starting at 6 weeks post-op - Ok to continue ointment daily to wound under a bandage for another week  HISTORY OF SQUAMOUS CELL CARCINOMA OF THE SKIN - No evidence of recurrence today - No lymphadenopathy - Recommend regular full  body skin exams - Recommend daily broad spectrum sunscreen SPF 30+ to sun-exposed areas, reapply every 2 hours as needed.  - Call if any new or changing lesions are noted between office visits  Return in about 3 weeks (around 01/29/2024) for wound check.  I, Darice Smock, CMA, am acting as scribe for RUFUS CHRISTELLA HOLY, MD.   Documentation: I have reviewed the above documentation for accuracy and completeness, and I agree with the above.  RUFUS CHRISTELLA HOLY, MD

## 2024-01-30 ENCOUNTER — Ambulatory Visit: Admitting: Dermatology

## 2024-02-04 NOTE — Progress Notes (Unsigned)
 Electrophysiology Clinic Note    Date:  02/05/2024  Patient ID:  Kathryn, [name] [birth year], MRN [id number] PCP:  Bertrum Charlie LITTIE, MD  Cardiologist:  Evalene Lunger, MD  Electrophysiologist:  OLE ONEIDA HOLTS, MD  Electrophysiology APP:  Tangy Drozdowski, NP    Discussed the use of AI scribe software for clinical note transcription with the patient, who gave verbal consent to proceed.   Patient Profile    Chief Complaint: SVT, amiodarone  monitoring  History of Present Illness: Kathryn [name] is a 89 y.o. female with PMH notable for SVT, HFimpEF, HTN, T2DM, COPD, hypothyroid ; seen today for OLE ONEIDA HOLTS, MD for routine electrophysiology followup.   She is on Amiodarone  for mgmt of SVT. I last saw her 07/2050 where her SVT was very well-managed. She did have some slight lower extremity edema and recommended to take PRN torsemide .   On follow-up today, she has been having worsening SOB for about 2 months, requires res after minimal exertion. No chest pain or palpitations. She is also having worsening R-sided lower extremity edema, historically the L side is the only side that is swollen. She has not taken additional torsemide  in many months. She does not check her BP regularly at home, but denies dizziness, LH, presyncope. She has good appetite, no coughing or sputum production, able to lay flat to sleep at night.      Arrhythmia/Device History Amiodarone      ROS:  Please see the history of present illness. All other systems are reviewed and otherwise negative.    Physical Exam    VS:  BP (!) 116/48 (BP Location: Left Arm, Patient Position: Sitting, Cuff Size: Normal)   Pulse 66   Ht 5' 4 (1.626 m)   Wt 196 lb 6.4 oz (89.1 kg)   SpO2 95%   BMI 33.71 kg/m  BMI: Body mass index is 33.71 kg/m.           Wt Readings from Last 3 Encounters:  02/05/24 196 lb 6.4 oz (89.1 kg)  08/02/23 198 lb 3.2 oz (89.9 kg)  06/14/23 203 lb 6 oz (92.3 kg)     GEN-  The patient is well appearing, alert and oriented x 3 today.   Lungs- Rhonchi in LLL, CTA otherwise, normal work of breathing.  Heart- Regular rate and rhythm, no murmurs, rubs or gallops Extremities- 2-3+ peripheral edema L>R, warm, dry   Studies Reviewed   Previous EP, cardiology notes.    EKG is ordered. Personal review of EKG from today shows:    EKG Interpretation Date/Time:  Wednesday February 05 2024 11:01:01 EST Ventricular Rate:  66 PR Interval:  202 QRS Duration:  126 QT Interval:  446 QTC Calculation: 467 R Axis:   46  Text Interpretation: Sinus rhythm with Premature atrial complexes Left bundle branch block Confirmed by Nayden Czajka [phone]) on 02/05/2024 11:04:11 AM    TTE, 07/10/2023  1. Left ventricular ejection fraction, by estimation, is 50 to 55%. The left ventricle has low normal function. The left ventricle demonstrates regional wall motion abnormalities (septal wall hypokinesis possibly secondary to conduction abnormality). Left ventricular diastolic parameters are consistent with Grade I  diastolic dysfunction (impaired relaxation). The average left ventricular global longitudinal strain is -18.3 %.   2. Right ventricular systolic function is normal. The right ventricular size is normal.   3. The mitral valve is normal in structure. Mild mitral valve regurgitation. No evidence of mitral stenosis.   4. The aortic  valve is normal in structure. Aortic valve regurgitation is mild. No aortic stenosis is present.   5. The inferior vena cava is normal in size with greater than 50% respiratory variability, suggesting right atrial pressure of 3 mmHg.    TTE, 05/24/2022  1. Left ventricular ejection fraction, by estimation, is 35 to 40%. The left ventricle has moderately decreased function. The left ventricle demonstrates global hypokinesis. Left ventricular diastolic parameters are consistent with Grade I diastolic dysfunction (impaired relaxation).   2. Right ventricular  systolic function is normal. The right ventricular size is normal. There is normal pulmonary artery systolic pressure. The estimated right ventricular systolic pressure is 14.2 mmHg.   3. The mitral valve is normal in structure. Mild mitral valve regurgitation. No evidence of mitral stenosis.   4. The aortic valve is normal in structure. Aortic valve regurgitation is not visualized. No aortic stenosis is present.   5. The inferior vena cava is normal in size with greater than 50% respiratory variability, suggesting right atrial pressure of 3 mmHg.    Long Term Monitor, 04/21/2020 Normal sinus rhythm Patient had a min HR of 66 bpm, max HR of 211 bpm, and avg HR of 87 bpm.    1 run of Ventricular Tachycardia occurred lasting 4 beats with a max rate of 185 bpm (avg 168 bpm).   43 Supraventricular Tachycardia  runs occurred, the run with the fastest interval lasting 10 hours 35 mins with a max rate of 211 bpm (avg 153 bpm); the run with the fastest interval was also the longest.  Idioventricular Rhythm was present. Isolated SVEs were rare (<1.0%), SVE Couplets  were rare (<1.0%), and SVE Triplets were rare (<1.0%).  Isolated VEs were rare (<1.0%, 2912), VE Couplets were rare (<1.0%, 23), and VE Triplets were rare (<1.0%, 7).  Ventricular Bigeminy and Trigeminy were present.   Patient triggered events (shortness of breath, fluttering/racing)  were not associated with significant arrhythmia   Assessment and Plan     #) SVT #) amiodarone  monitoring #) COPD #) SOB #) HFimpEF Unclear cause of patient's worsening SOB. She is having increased lower extremity edema, but no change in appetite, no PND No palpitations Recommend CXR today I recommended she follow-up soon with PCP for additional eval/treatment of COPD If continues to have SOB, consider chest CT to rule out amio pulm toxicity Consider updated TTE Will update CMP, thyroid  labs, CBC, and BNP.  Recommended she take additional PRN 20mg   torsemide  this afternoon when returns home      Current medicines are reviewed at length with the patient today.   The patient does not have concerns regarding her medicines.  The following changes were made today:  none  Labs/ tests ordered today include:  Orders Placed This Encounter  Procedures   DG Chest 2 View   Comprehensive metabolic panel with GFR   TSH   T4, free   Brain natriuretic peptide   CBC   EKG 12-Lead     Disposition: Follow up with Dr. Kennyth or EP APP in 6 months, sooner if needed  Follow-up with Dr. Gollan or gen cards APP in ~6 weeks   Signed, Chantal Needle, NP  02/05/24  12:47 PM  Electrophysiology [facility]

## 2024-02-05 ENCOUNTER — Encounter: Payer: Self-pay | Admitting: Cardiology

## 2024-02-05 ENCOUNTER — Ambulatory Visit: Payer: Self-pay | Admitting: Cardiology

## 2024-02-05 ENCOUNTER — Other Ambulatory Visit: Admission: RE | Admit: 2024-02-05 | Source: Home / Self Care

## 2024-02-05 ENCOUNTER — Ambulatory Visit
Admission: RE | Admit: 2024-02-05 | Discharge: 2024-02-05 | Disposition: A | Discharge status: Home / Self Care | Attending: Cardiology | Admitting: Cardiology

## 2024-02-05 ENCOUNTER — Ambulatory Visit: Attending: Cardiology | Admitting: Cardiology

## 2024-02-05 ENCOUNTER — Ambulatory Visit
Admission: RE | Admit: 2024-02-05 | Discharge: 2024-02-05 | Disposition: A | Discharge status: Home / Self Care | Source: Ambulatory Visit | Attending: Cardiology | Admitting: Cardiology

## 2024-02-05 ENCOUNTER — Other Ambulatory Visit
Admission: RE | Admit: 2024-02-05 | Discharge: 2024-02-05 | Disposition: A | Discharge status: Home / Self Care | Source: Home / Self Care | Attending: Cardiology | Admitting: Cardiology

## 2024-02-05 VITALS — BP 116/48 | HR 66 | Ht 64.0 in | Wt 196.4 lb

## 2024-02-05 DIAGNOSIS — R0602 Shortness of breath: Secondary | ICD-10-CM | POA: Insufficient documentation

## 2024-02-05 DIAGNOSIS — Z5181 Encounter for therapeutic drug level monitoring: Secondary | ICD-10-CM | POA: Insufficient documentation

## 2024-02-05 DIAGNOSIS — I5020 Unspecified systolic (congestive) heart failure: Secondary | ICD-10-CM

## 2024-02-05 DIAGNOSIS — Z79899 Other long term (current) drug therapy: Secondary | ICD-10-CM

## 2024-02-05 DIAGNOSIS — I471 Supraventricular tachycardia, unspecified: Secondary | ICD-10-CM

## 2024-02-05 LAB — CBC
HCT: 32.1 % — ABNORMAL LOW (ref 36.0–46.0)
Hemoglobin: 8.6 g/dL — ABNORMAL LOW (ref 12.0–15.0)
MCH: 20.6 pg — ABNORMAL LOW (ref 26.0–34.0)
MCHC: 26.8 g/dL — ABNORMAL LOW (ref 30.0–36.0)
MCV: 77 fL — ABNORMAL LOW (ref 80.0–100.0)
Platelets: 328 K/uL (ref 150–400)
RBC: 4.17 MIL/uL (ref 3.87–5.11)
RDW: 18.6 % — ABNORMAL HIGH (ref 11.5–15.5)
WBC: 7.5 K/uL (ref 4.0–10.5)
nRBC: 0 % (ref 0.0–0.2)

## 2024-02-05 LAB — PRO BRAIN NATRIURETIC PEPTIDE: Pro Brain Natriuretic Peptide: 502 pg/mL — ABNORMAL HIGH (ref <300.0)

## 2024-02-05 LAB — COMPREHENSIVE METABOLIC PANEL WITH GFR
ALT: 7 U/L (ref 0–44)
AST: 15 U/L (ref 15–41)
Albumin: 4.1 g/dL (ref 3.5–5.0)
Alkaline Phosphatase: 95 U/L (ref 38–126)
Anion gap: 10 (ref 5–15)
BUN: 28 mg/dL — ABNORMAL HIGH (ref 8–23)
CO2: 27 mmol/L (ref 22–32)
Calcium: 9.5 mg/dL (ref 8.9–10.3)
Chloride: 106 mmol/L (ref 98–111)
Creatinine, Ser: 1.55 mg/dL — ABNORMAL HIGH (ref 0.44–1.00)
GFR, Estimated: 32 mL/min — ABNORMAL LOW (ref >60)
Glucose, Bld: 134 mg/dL — ABNORMAL HIGH (ref 70–99)
Potassium: 5 mmol/L (ref 3.5–5.1)
Sodium: 143 mmol/L (ref 135–145)
Total Bilirubin: 0.3 mg/dL (ref 0.0–1.2)
Total Protein: 6.8 g/dL (ref 6.5–8.1)

## 2024-02-05 LAB — T4, FREE: Free T4: 1.42 ng/dL — ABNORMAL HIGH (ref 0.61–1.12)

## 2024-02-05 LAB — TSH: TSH: 1.58 u[IU]/mL (ref 0.350–4.500)

## 2024-02-05 NOTE — Patient Instructions (Signed)
 Medication Instructions:   Please take an extra Torsemide  20 MG tablet today.  *If you need a refill on your cardiac medications before your next appointment, please call your pharmacy*  Lab Work: Your provider would like for you to have following labs drawn today CMP, TSH, T4, BNP and CBC.   If you have labs (blood work) drawn today and your tests are completely normal, you will receive your results only by: MyChart Message (if you have MyChart) OR A paper copy in the mail If you have any lab test that is abnormal or we need to change your treatment, we will call you to review the results.  Testing/Procedures:  Your provider has ordered a chest X-Ray for you. You can have this done at the [facility] medical mall. You do not need an appointment. Please go to the entrance of the Medical Mall and check in at the front desk.    Follow-Up: At [facility], you and your health needs are our priority.  As part of our continuing mission to provide you with exceptional heart care, our providers are all part of one team.  This team includes your primary Cardiologist (physician) and Advanced Practice Providers or APPs (Physician Assistants and Nurse Practitioners) who all work together to provide you with the care you need, when you need it.  Your next appointment:   6 month(s)  Provider:   Suzann Riddle, NP or Dr. Kennyth    We recommend signing up for the patient portal called MyChart.  Sign up information is provided on this After Visit Summary.  MyChart is used to connect with patients for Virtual Visits (Telemedicine).  Patients are able to view lab/test results, encounter notes, upcoming appointments, etc.  Non-urgent messages can be sent to your provider as well.   To learn more about what you can do with MyChart, go to [email or web address].   Other Instructions Follow up with General Cardiology in 6 weeks.  Follow up with PCP.

## 2024-02-12 ENCOUNTER — Ambulatory Visit

## 2024-03-25 ENCOUNTER — Ambulatory Visit: Admitting: Medical

## 2024-04-13 ENCOUNTER — Other Ambulatory Visit: Payer: Self-pay | Admitting: Cardiovascular Disease

## 2024-04-17 NOTE — Telephone Encounter (Signed)
 Please contact pt for future appointment. Pt due for follow up.

## 2024-04-21 NOTE — Telephone Encounter (Signed)
 Pt scheduled on 1/30

## 2024-04-24 ENCOUNTER — Ambulatory Visit: Admitting: Physician Assistant

## 2024-04-28 ENCOUNTER — Ambulatory Visit: Admitting: Physician Assistant

## 2024-04-28 ENCOUNTER — Encounter: Payer: Self-pay | Admitting: Physician Assistant

## 2024-04-28 VITALS — BP 115/58 | HR 64 | Ht 64.0 in | Wt 198.4 lb

## 2024-04-28 DIAGNOSIS — D509 Iron deficiency anemia, unspecified: Secondary | ICD-10-CM | POA: Diagnosis not present

## 2024-04-28 DIAGNOSIS — I2510 Atherosclerotic heart disease of native coronary artery without angina pectoris: Secondary | ICD-10-CM

## 2024-04-28 DIAGNOSIS — Z79899 Other long term (current) drug therapy: Secondary | ICD-10-CM

## 2024-04-28 DIAGNOSIS — I5020 Unspecified systolic (congestive) heart failure: Secondary | ICD-10-CM

## 2024-04-28 DIAGNOSIS — I447 Left bundle-branch block, unspecified: Secondary | ICD-10-CM

## 2024-04-28 DIAGNOSIS — R0609 Other forms of dyspnea: Secondary | ICD-10-CM | POA: Diagnosis not present

## 2024-04-28 DIAGNOSIS — J42 Unspecified chronic bronchitis: Secondary | ICD-10-CM

## 2024-04-28 DIAGNOSIS — I10 Essential (primary) hypertension: Secondary | ICD-10-CM | POA: Diagnosis not present

## 2024-04-28 DIAGNOSIS — N1832 Chronic kidney disease, stage 3b: Secondary | ICD-10-CM | POA: Diagnosis not present

## 2024-04-28 DIAGNOSIS — E785 Hyperlipidemia, unspecified: Secondary | ICD-10-CM

## 2024-04-28 DIAGNOSIS — I471 Supraventricular tachycardia, unspecified: Secondary | ICD-10-CM | POA: Diagnosis not present

## 2024-04-28 DIAGNOSIS — I739 Peripheral vascular disease, unspecified: Secondary | ICD-10-CM

## 2024-04-29 LAB — BASIC METABOLIC PANEL WITH GFR
BUN/Creatinine Ratio: 16 (ref 12–28)
BUN: 30 mg/dL — ABNORMAL HIGH (ref 8–27)
CO2: 21 mmol/L (ref 20–29)
Calcium: 9.6 mg/dL (ref 8.7–10.3)
Chloride: 105 mmol/L (ref 96–106)
Creatinine, Ser: 1.87 mg/dL — ABNORMAL HIGH (ref 0.57–1.00)
Glucose: 152 mg/dL — ABNORMAL HIGH (ref 70–99)
Potassium: 5.2 mmol/L (ref 3.5–5.2)
Sodium: 143 mmol/L (ref 134–144)
eGFR: 26 mL/min/{1.73_m2} — ABNORMAL LOW

## 2024-04-29 LAB — CBC
Hematocrit: 32.8 % — ABNORMAL LOW (ref 34.0–46.6)
Hemoglobin: 9.2 g/dL — ABNORMAL LOW (ref 11.1–15.9)
MCH: 20.3 pg — ABNORMAL LOW (ref 26.6–33.0)
MCHC: 28 g/dL — ABNORMAL LOW (ref 31.5–35.7)
MCV: 72 fL — ABNORMAL LOW (ref 79–97)
Platelets: 301 10*3/uL (ref 150–450)
RBC: 4.53 x10E6/uL (ref 3.77–5.28)
RDW: 17.9 % — ABNORMAL HIGH (ref 11.7–15.4)
WBC: 6.8 10*3/uL (ref 3.4–10.8)

## 2024-04-30 ENCOUNTER — Ambulatory Visit: Payer: Self-pay | Admitting: Physician Assistant

## 2024-04-30 DIAGNOSIS — Z79899 Other long term (current) drug therapy: Secondary | ICD-10-CM

## 2024-04-30 MED ORDER — TORSEMIDE 20 MG PO TABS: 20.0000 mg | ORAL_TABLET | Freq: Once | ORAL | 3 refills | Status: AC

## 2024-05-01 ENCOUNTER — Other Ambulatory Visit: Payer: Self-pay

## 2024-05-01 NOTE — Telephone Encounter (Signed)
 [facility] mail order pharmacy requesting clarification on medication torsemide  20 mg tablet. Please clarify how pt is supposed to be taking medication now since the provider changed the directions.  Nmizm#[id number]  Ph# [phone] please address

## 2024-05-08 ENCOUNTER — Ambulatory Visit

## 2024-07-31 ENCOUNTER — Ambulatory Visit: Admitting: Cardiovascular Disease

## 2024-08-04 ENCOUNTER — Ambulatory Visit: Admitting: Cardiology

## 2024-08-25 ENCOUNTER — Ambulatory Visit
